# Patient Record
Sex: Male | Born: 1955 | Race: White | Hispanic: No | Smoking: Former smoker
Health system: Southern US, Community
[De-identification: ages and names within clinical notes are randomized; demographics above are authoritative.]

## PROBLEM LIST (undated history)

## (undated) DIAGNOSIS — N186 End stage renal disease: Secondary | ICD-10-CM

## (undated) DIAGNOSIS — K861 Other chronic pancreatitis: Secondary | ICD-10-CM

## (undated) DIAGNOSIS — F1910 Other psychoactive substance abuse, uncomplicated: Secondary | ICD-10-CM

## (undated) DIAGNOSIS — Z9289 Personal history of other medical treatment: Secondary | ICD-10-CM

## (undated) DIAGNOSIS — B182 Chronic viral hepatitis C: Secondary | ICD-10-CM

## (undated) DIAGNOSIS — J189 Pneumonia, unspecified organism: Secondary | ICD-10-CM

## (undated) DIAGNOSIS — F17200 Nicotine dependence, unspecified, uncomplicated: Secondary | ICD-10-CM

## (undated) DIAGNOSIS — I10 Essential (primary) hypertension: Secondary | ICD-10-CM

## (undated) DIAGNOSIS — I209 Angina pectoris, unspecified: Secondary | ICD-10-CM

## (undated) DIAGNOSIS — J449 Chronic obstructive pulmonary disease, unspecified: Secondary | ICD-10-CM

## (undated) DIAGNOSIS — Z992 Dependence on renal dialysis: Secondary | ICD-10-CM

## (undated) DIAGNOSIS — F1010 Alcohol abuse, uncomplicated: Secondary | ICD-10-CM

## (undated) DIAGNOSIS — B1920 Unspecified viral hepatitis C without hepatic coma: Secondary | ICD-10-CM

## (undated) DIAGNOSIS — K279 Peptic ulcer, site unspecified, unspecified as acute or chronic, without hemorrhage or perforation: Secondary | ICD-10-CM

## (undated) DIAGNOSIS — J45909 Unspecified asthma, uncomplicated: Secondary | ICD-10-CM

## (undated) DIAGNOSIS — J9690 Respiratory failure, unspecified, unspecified whether with hypoxia or hypercapnia: Secondary | ICD-10-CM

## (undated) DIAGNOSIS — R161 Splenomegaly, not elsewhere classified: Secondary | ICD-10-CM

## (undated) DIAGNOSIS — T300 Burn of unspecified body region, unspecified degree: Secondary | ICD-10-CM

## (undated) HISTORY — DX: Other psychoactive substance abuse, uncomplicated: F19.10

## (undated) HISTORY — PX: AV FISTULA PLACEMENT: SHX1204

## (undated) HISTORY — PX: ANKLE FRACTURE SURGERY: SHX122

## (undated) HISTORY — DX: Unspecified viral hepatitis C without hepatic coma: B19.20

## (undated) HISTORY — DX: Other chronic pancreatitis: K86.1

## (undated) HISTORY — DX: Dependence on renal dialysis: Z99.2

## (undated) HISTORY — DX: Alcohol abuse, uncomplicated: F10.10

## (undated) HISTORY — PX: ARM DEBRIDEMENT: SHX890

## (undated) HISTORY — DX: Essential (primary) hypertension: I10

## (undated) HISTORY — DX: Respiratory failure, unspecified, unspecified whether with hypoxia or hypercapnia: J96.90

## (undated) HISTORY — DX: Dependence on renal dialysis: N18.6

## (undated) HISTORY — DX: Chronic obstructive pulmonary disease, unspecified: J44.9

---

## 1993-08-05 HISTORY — PX: SKIN GRAFT: SHX250

## 2001-03-30 ENCOUNTER — Emergency Department (HOSPITAL_COMMUNITY): Admission: EM | Admit: 2001-03-30 | Discharge: 2001-03-30 | Payer: Self-pay | Admitting: Emergency Medicine

## 2002-05-24 ENCOUNTER — Emergency Department (HOSPITAL_COMMUNITY): Admission: EM | Admit: 2002-05-24 | Discharge: 2002-05-25 | Payer: Self-pay | Admitting: Emergency Medicine

## 2002-06-07 ENCOUNTER — Emergency Department (HOSPITAL_COMMUNITY): Admission: EM | Admit: 2002-06-07 | Discharge: 2002-06-07 | Payer: Self-pay | Admitting: Emergency Medicine

## 2002-06-16 ENCOUNTER — Emergency Department (HOSPITAL_COMMUNITY): Admission: EM | Admit: 2002-06-16 | Discharge: 2002-06-17 | Payer: Self-pay | Admitting: Emergency Medicine

## 2002-06-16 ENCOUNTER — Encounter: Payer: Self-pay | Admitting: Emergency Medicine

## 2002-06-22 ENCOUNTER — Encounter: Payer: Self-pay | Admitting: Psychiatry

## 2002-06-22 ENCOUNTER — Inpatient Hospital Stay (HOSPITAL_COMMUNITY): Admission: EM | Admit: 2002-06-22 | Discharge: 2002-06-28 | Payer: Self-pay | Admitting: Psychiatry

## 2002-06-22 ENCOUNTER — Encounter: Payer: Self-pay | Admitting: Emergency Medicine

## 2002-07-04 ENCOUNTER — Emergency Department (HOSPITAL_COMMUNITY): Admission: EM | Admit: 2002-07-04 | Discharge: 2002-07-04 | Payer: Self-pay | Admitting: Emergency Medicine

## 2002-07-05 ENCOUNTER — Encounter: Payer: Self-pay | Admitting: Emergency Medicine

## 2002-11-02 ENCOUNTER — Encounter: Payer: Self-pay | Admitting: Emergency Medicine

## 2002-11-02 ENCOUNTER — Emergency Department (HOSPITAL_COMMUNITY): Admission: EM | Admit: 2002-11-02 | Discharge: 2002-11-02 | Payer: Self-pay | Admitting: Emergency Medicine

## 2003-01-29 ENCOUNTER — Emergency Department (HOSPITAL_COMMUNITY): Admission: EM | Admit: 2003-01-29 | Discharge: 2003-01-29 | Payer: Self-pay | Admitting: Emergency Medicine

## 2003-01-29 ENCOUNTER — Encounter: Payer: Self-pay | Admitting: Emergency Medicine

## 2003-04-20 ENCOUNTER — Emergency Department (HOSPITAL_COMMUNITY): Admission: EM | Admit: 2003-04-20 | Discharge: 2003-04-20 | Payer: Self-pay | Admitting: Emergency Medicine

## 2003-04-20 ENCOUNTER — Encounter: Payer: Self-pay | Admitting: Emergency Medicine

## 2003-04-28 ENCOUNTER — Emergency Department (HOSPITAL_COMMUNITY): Admission: EM | Admit: 2003-04-28 | Discharge: 2003-04-28 | Payer: Self-pay | Admitting: Emergency Medicine

## 2003-04-28 ENCOUNTER — Encounter: Payer: Self-pay | Admitting: Emergency Medicine

## 2003-05-06 ENCOUNTER — Emergency Department (HOSPITAL_COMMUNITY): Admission: EM | Admit: 2003-05-06 | Discharge: 2003-05-07 | Payer: Self-pay | Admitting: Emergency Medicine

## 2003-05-07 ENCOUNTER — Encounter: Payer: Self-pay | Admitting: Emergency Medicine

## 2003-08-09 ENCOUNTER — Encounter: Payer: Self-pay | Admitting: Emergency Medicine

## 2003-08-10 ENCOUNTER — Inpatient Hospital Stay (HOSPITAL_COMMUNITY): Admission: EM | Admit: 2003-08-10 | Discharge: 2003-08-15 | Payer: Self-pay | Admitting: Psychiatry

## 2003-09-20 ENCOUNTER — Emergency Department (HOSPITAL_COMMUNITY): Admission: EM | Admit: 2003-09-20 | Discharge: 2003-09-21 | Payer: Self-pay | Admitting: Emergency Medicine

## 2003-10-06 ENCOUNTER — Emergency Department (HOSPITAL_COMMUNITY): Admission: EM | Admit: 2003-10-06 | Discharge: 2003-10-06 | Payer: Self-pay
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
## 2004-01-29 ENCOUNTER — Inpatient Hospital Stay (HOSPITAL_COMMUNITY): Admission: EM | Admit: 2004-01-29 | Discharge: 2004-02-02 | Payer: Self-pay | Admitting: Emergency Medicine

## 2004-02-21 ENCOUNTER — Inpatient Hospital Stay (HOSPITAL_COMMUNITY): Admission: EM | Admit: 2004-02-21 | Discharge: 2004-02-24 | Payer: Self-pay | Admitting: Emergency Medicine

## 2004-03-27 IMAGING — CR DG HAND COMPLETE 3+V*L*
5 series · 5 of 5 positions shown · non-contrast
Comparison: 07/05/02.

CLINICAL DATA: Medical clearance.  Pain.

[view not recorded (1 of 5)]
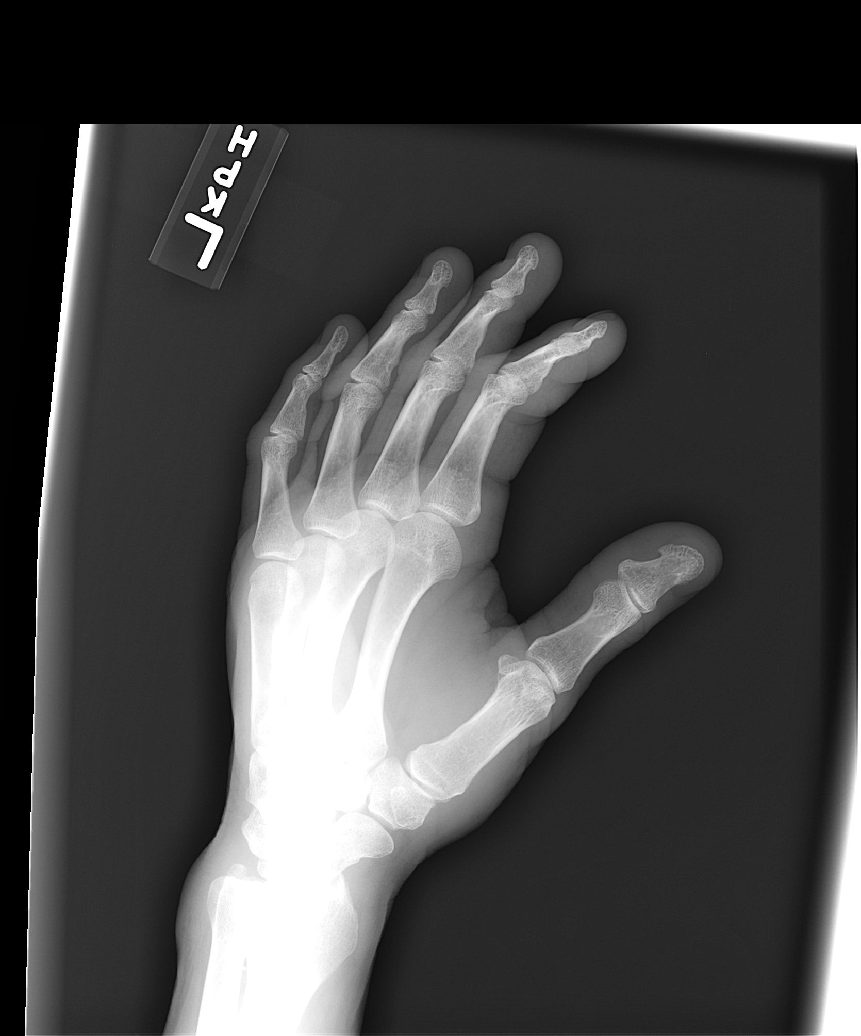

[view not recorded (2 of 5)]
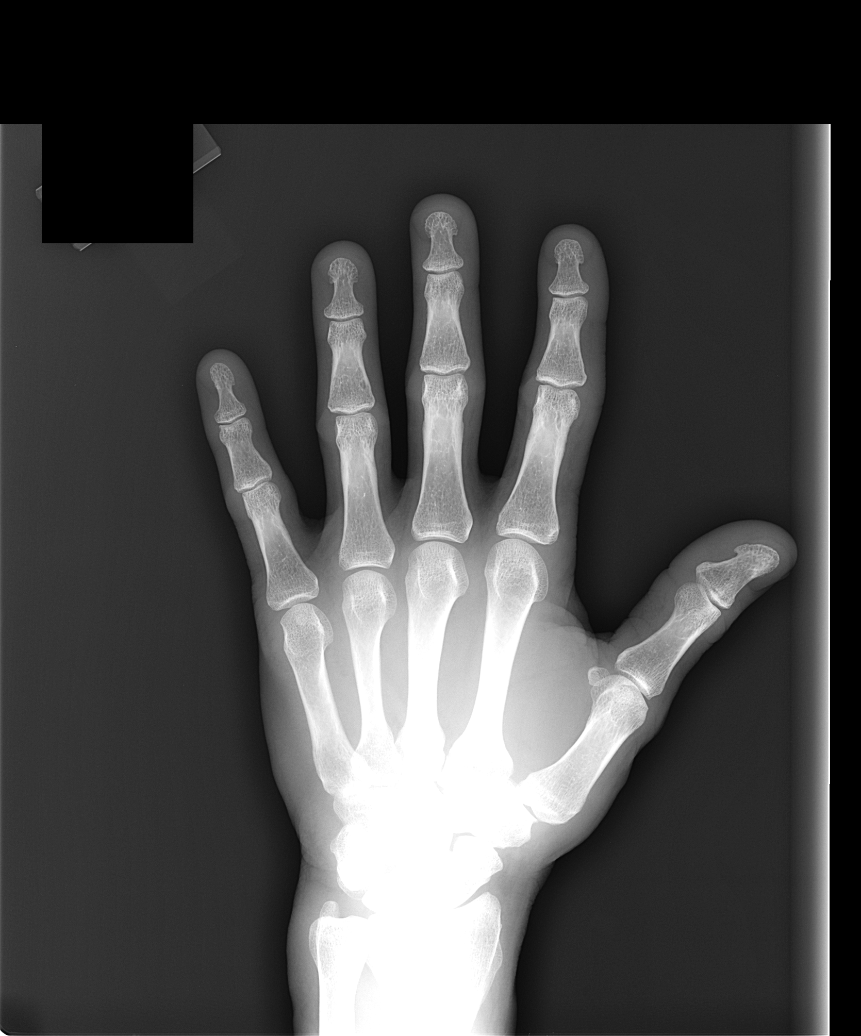

[view not recorded (3 of 5)]
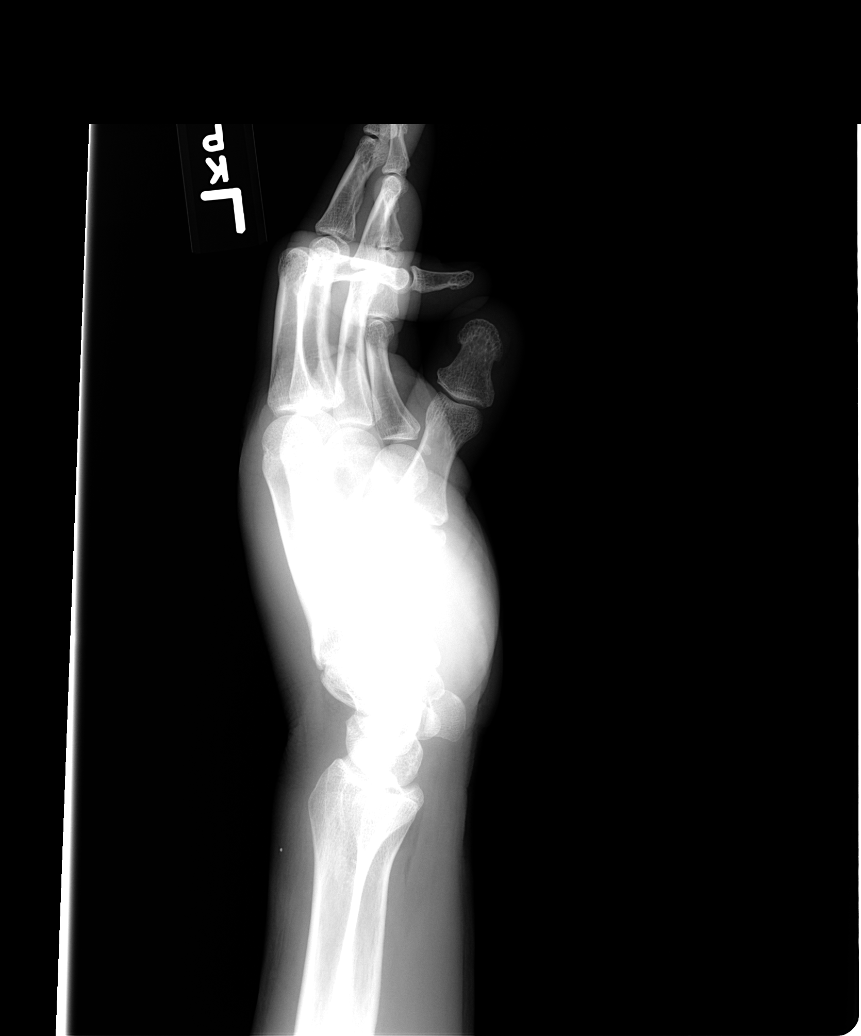

[view not recorded (4 of 5)]
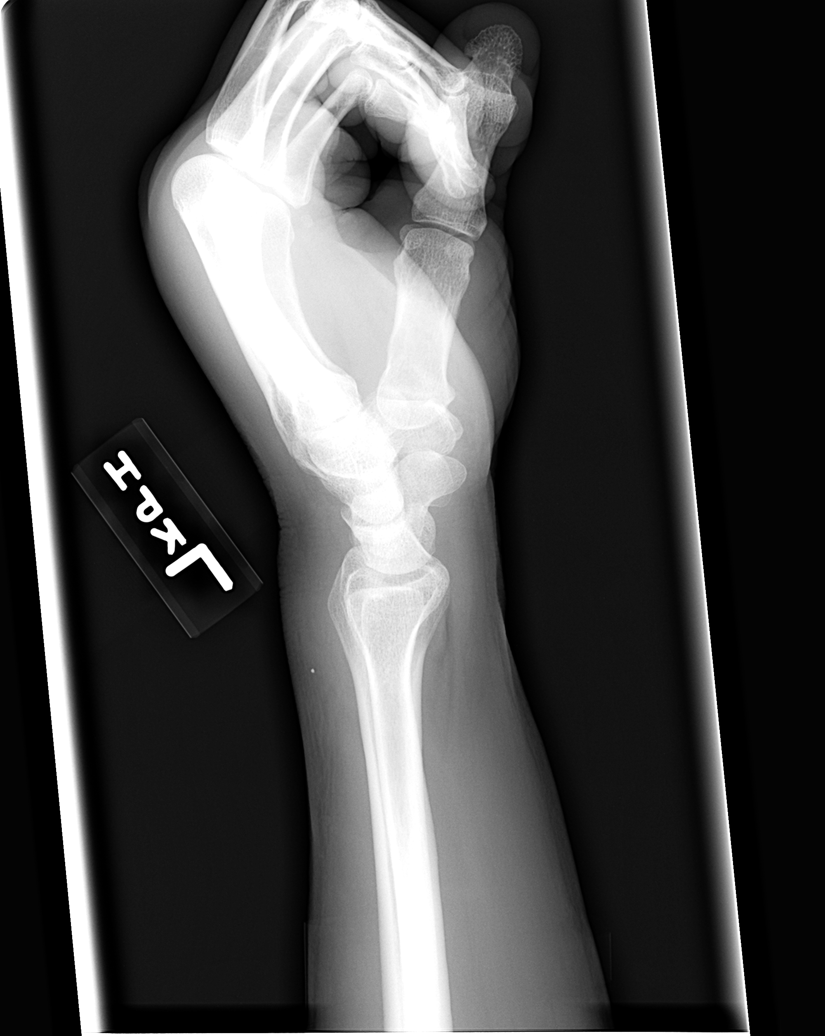

[view not recorded (5 of 5)]
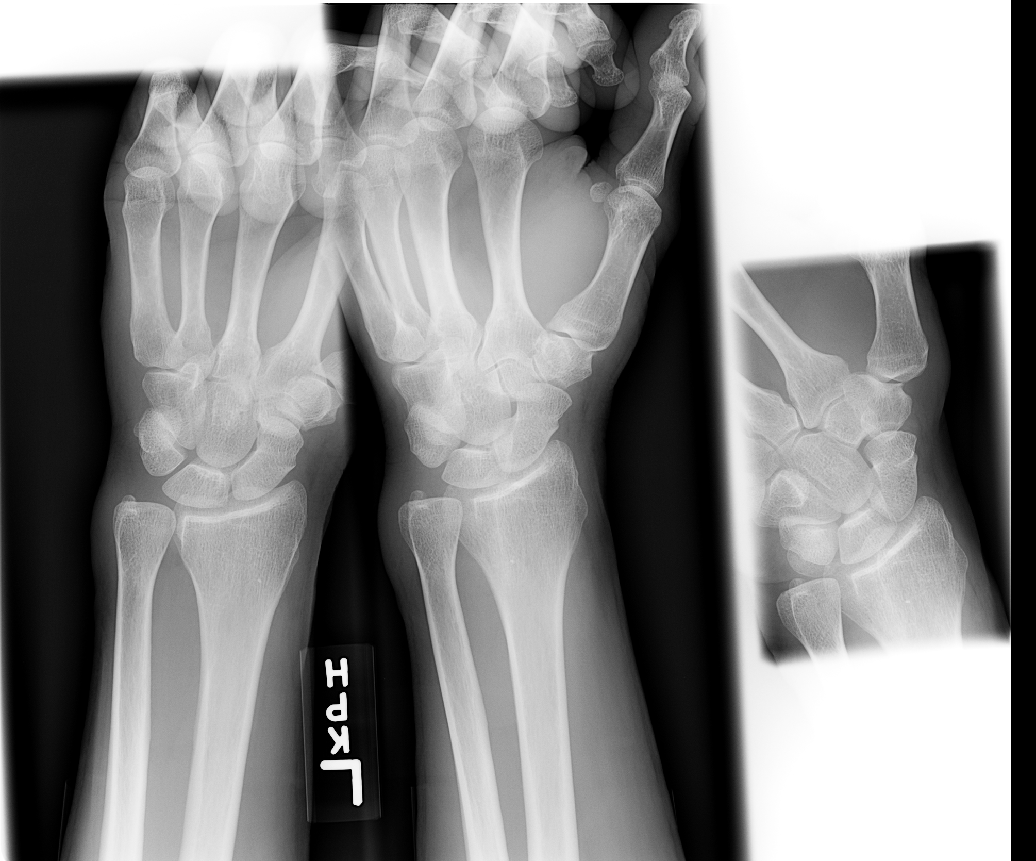

[5 of 5 positions shown; findings below may reference images not displayed]

LEFT HAND 
 There is no evidence of fracture or dislocation.  No other significant bone or soft tissue abnormalities are identified.  The joint spaces are within normal limits.

 IMPRESSION
 Normal Study.

 LEFT WRIST 
 There is no evidence of fracture or dislocation. No other significant bone or soft tissue abnormalities are identified. The joint spaces are within normal limits. 

 IMPRESSION
 Normal study.
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
## 2004-05-20 ENCOUNTER — Emergency Department (HOSPITAL_COMMUNITY): Admission: EM | Admit: 2004-05-20 | Discharge: 2004-05-20 | Payer: Self-pay | Admitting: Emergency Medicine

## 2004-08-09 ENCOUNTER — Emergency Department (HOSPITAL_COMMUNITY): Admission: EM | Admit: 2004-08-09 | Discharge: 2004-08-09 | Payer: Self-pay | Admitting: Emergency Medicine
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
## 2004-08-28 ENCOUNTER — Emergency Department (HOSPITAL_COMMUNITY): Admission: EM | Admit: 2004-08-28 | Discharge: 2004-08-28 | Payer: Self-pay | Admitting: *Deleted

## 2004-11-11 ENCOUNTER — Inpatient Hospital Stay (HOSPITAL_COMMUNITY): Admission: EM | Admit: 2004-11-11 | Discharge: 2004-11-17 | Payer: Self-pay | Admitting: Emergency Medicine

## 2004-11-25 IMAGING — CT CT HEAD W/O CM
1 series · 16 of 28 positions shown, 20 images · non-contrast
Comparison: none

CLINICAL DATA: Fall.  
 CT HEAD WITHOUT CONTRAST ([DATE] HOURS)

[Series 2: trauma head · axial · 0.47mm/px · z∈[+134,+263]mm · 16 of 28 slices shown, 20 images]
[im 2/28  brain]
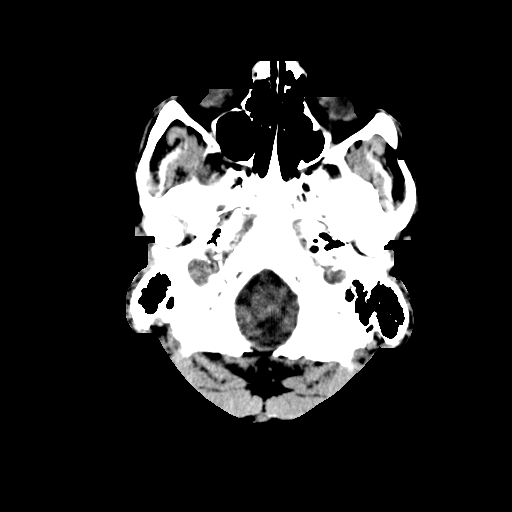
[im 2/28  bone]
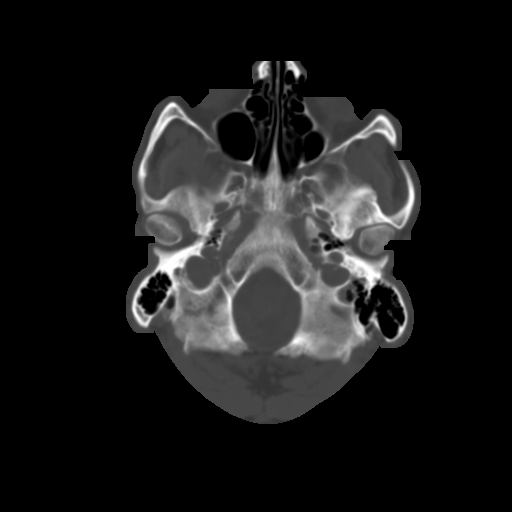
[im 4/28  brain]
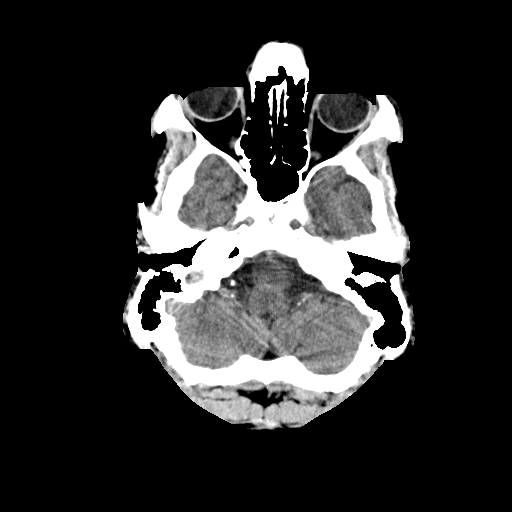
[im 6/28  brain]
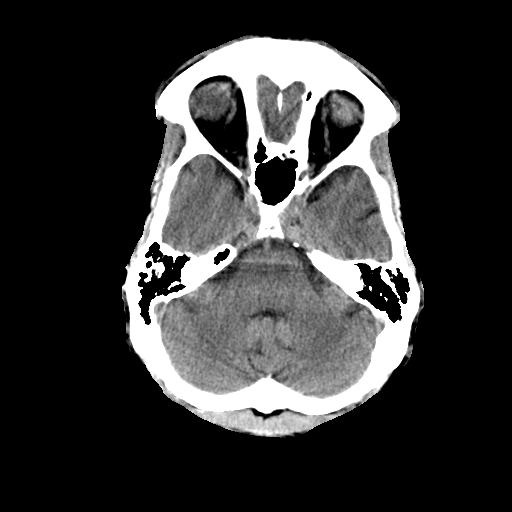
[im 7/28  brain]
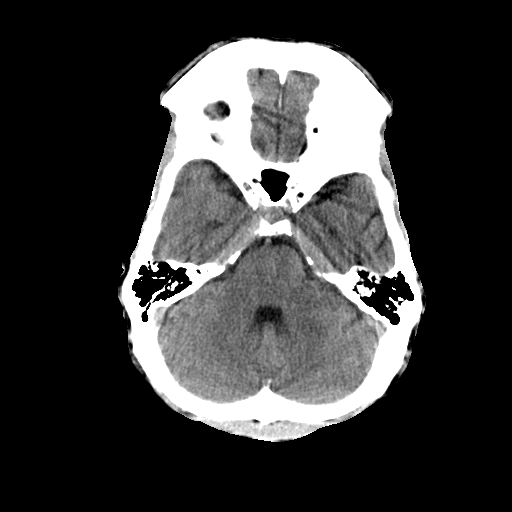
[im 9/28  brain]
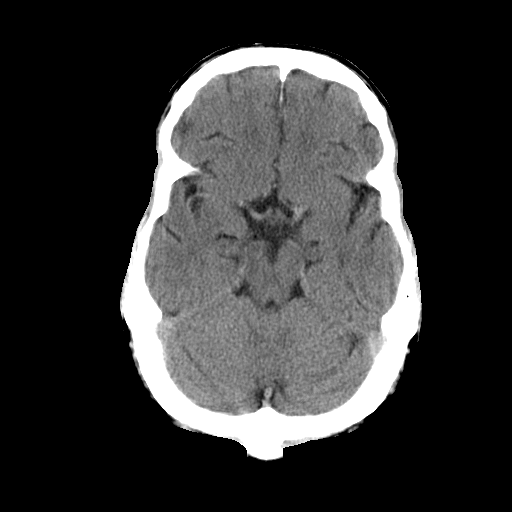
[im 9/28  bone]
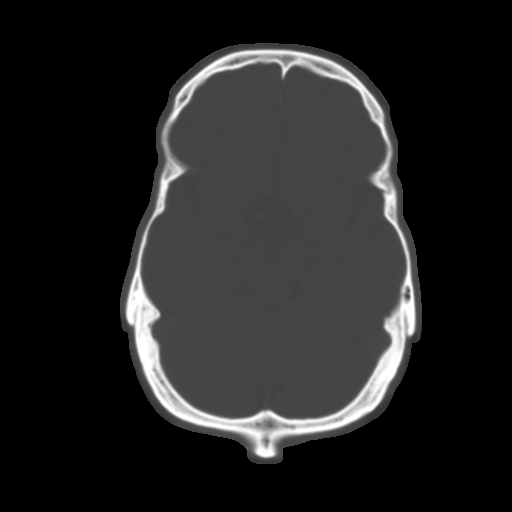
[im 10/28  brain]
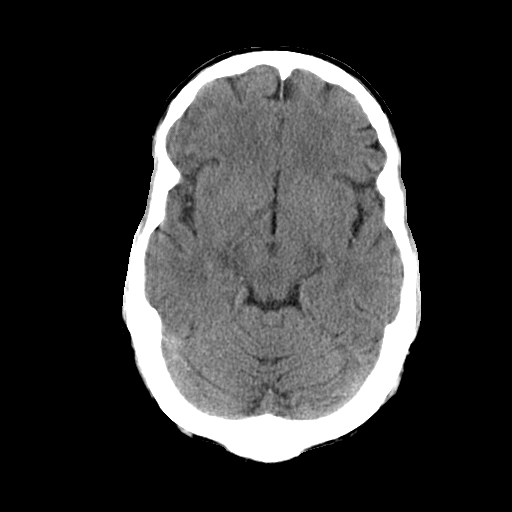
[im 12/28  brain]
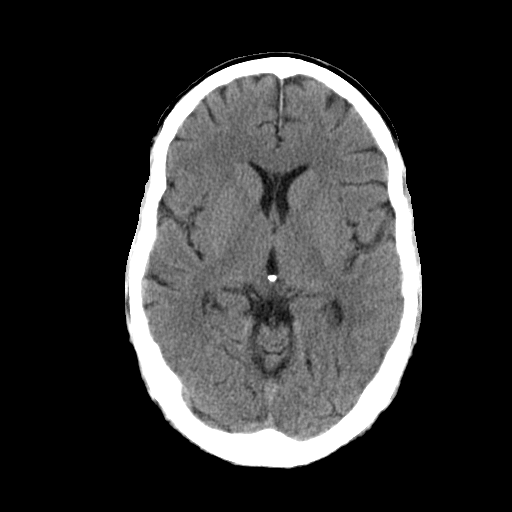
[im 14/28  brain]
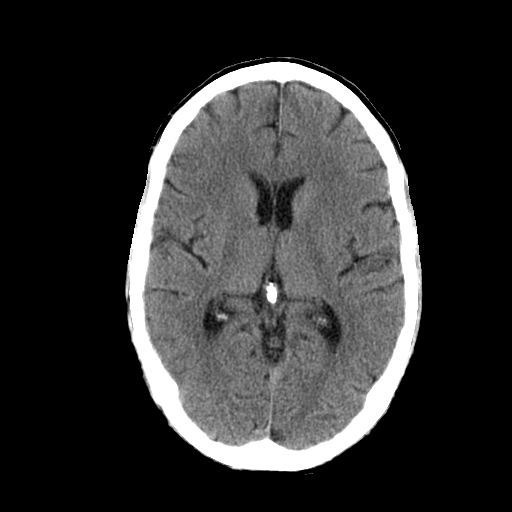
[im 15/28  brain]
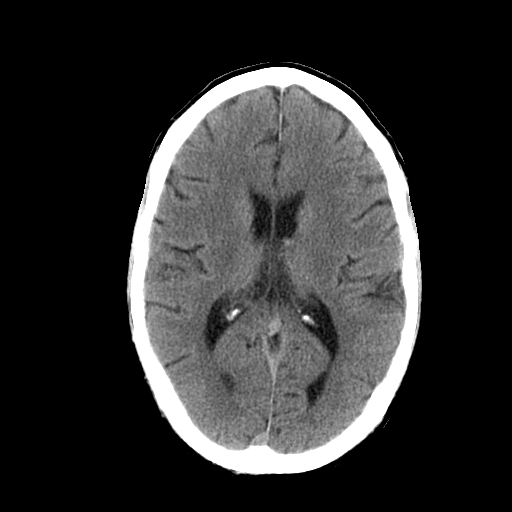
[im 15/28  bone]
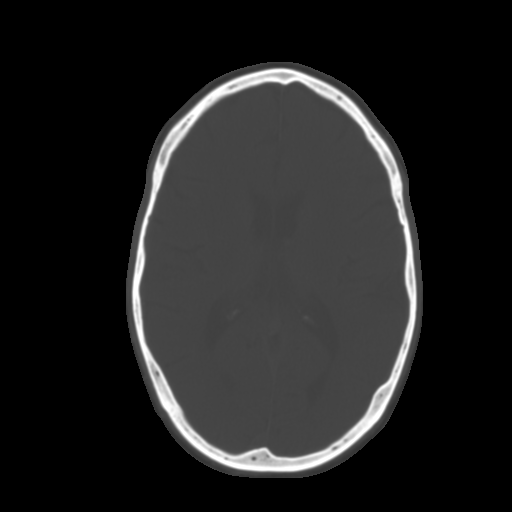
[im 17/28  brain]
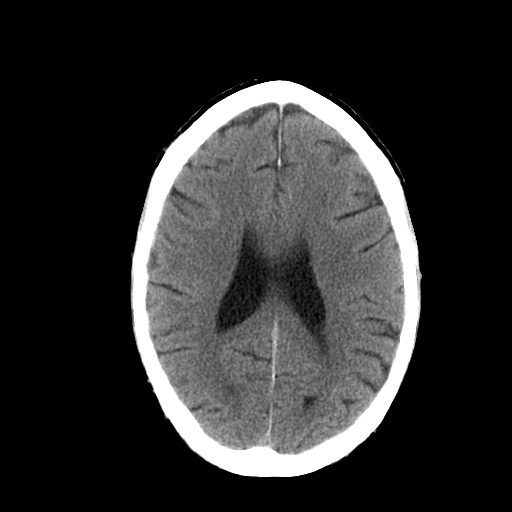
[im 19/28  brain]
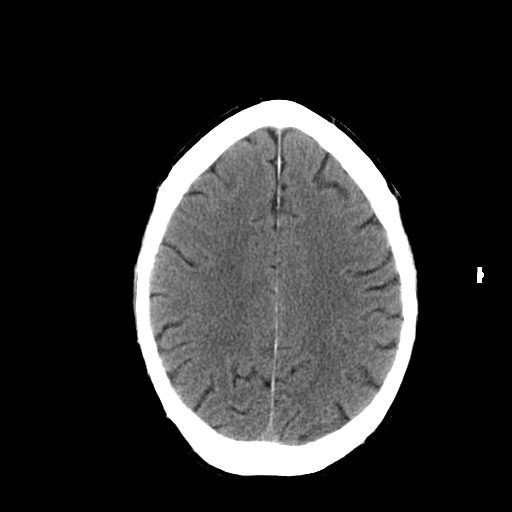
[im 20/28  brain]
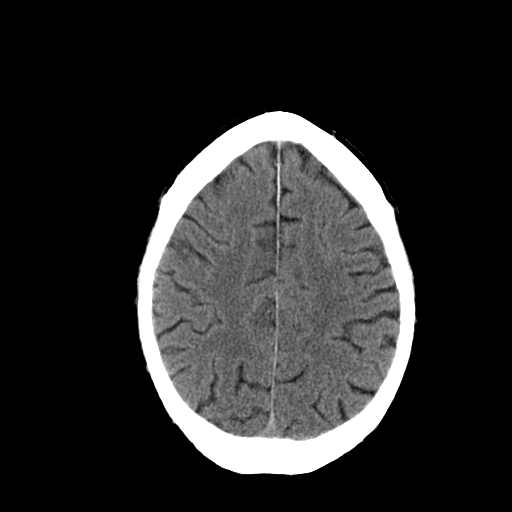
[im 22/28  brain]
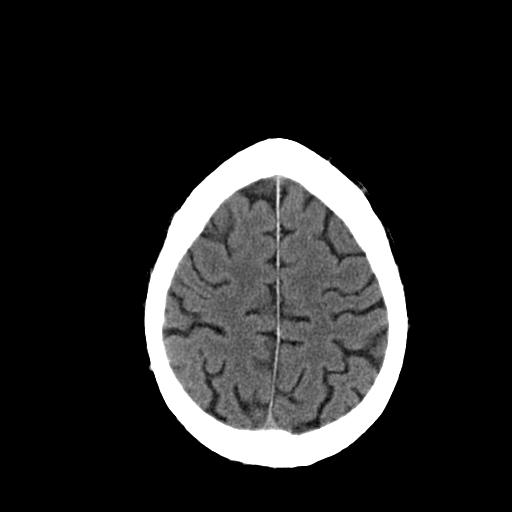
[im 22/28  bone]
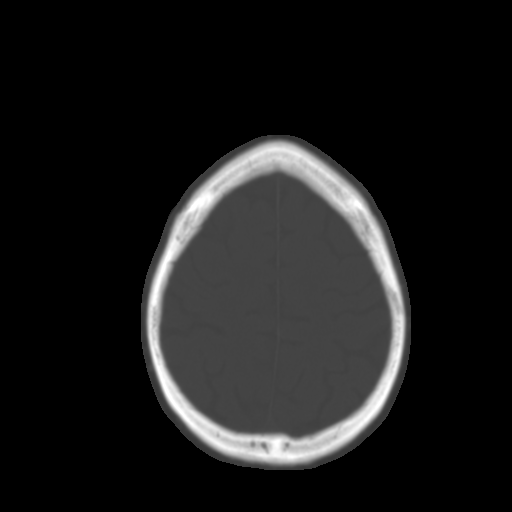
[im 23/28  brain]
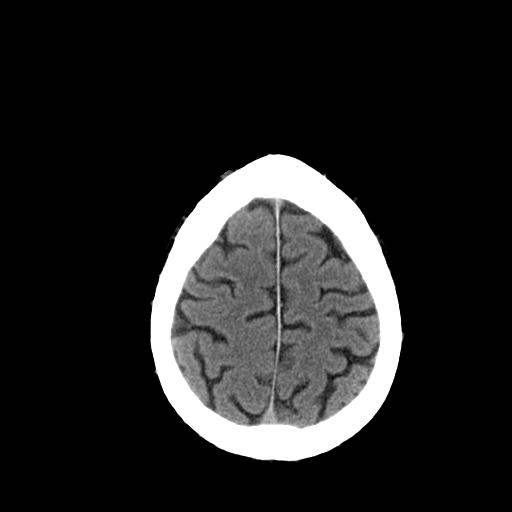
[im 25/28  brain]
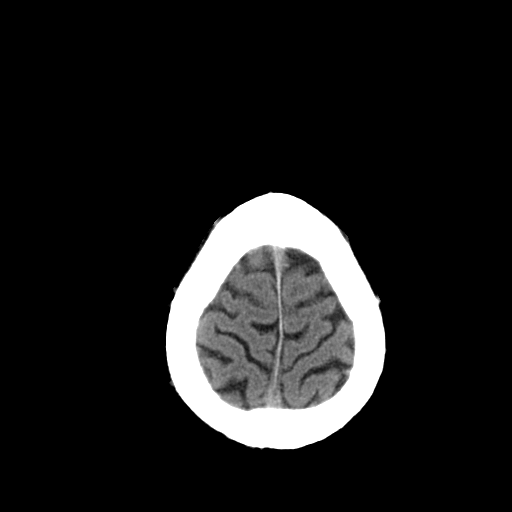
[im 27/28  brain]
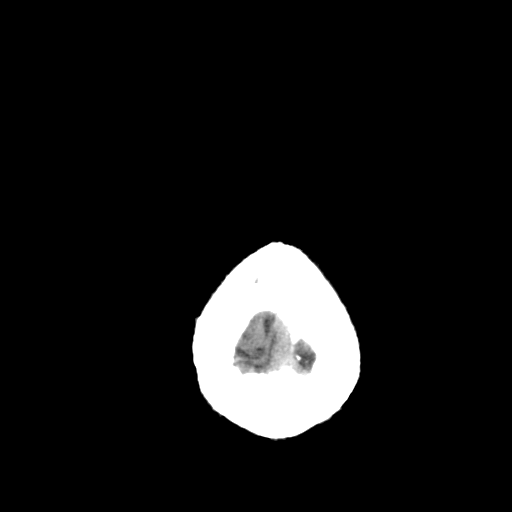

[16 of 28 positions shown; findings below may reference images not displayed]

FINDINGS: The brain parenchymal ventricular system, and extra-axial space are within normal limits.  There is no evidence of mass effect midline shift, or acute hemorrhage.  The mastoid air cells and visualized paranasal sinuses are clear.  The cranium is intact. 
 IMPRESSION
 No evidence of acute intracranial pathology.

## 2004-11-25 IMAGING — CR DG RIBS 2V*L*
3 series · 3 of 3 positions shown · non-contrast
Comparison: none

CLINICAL DATA: Fall.
 CHEST TWO VIEW, 05/20/04, 6967 HOURS
 Comparison 10/06/03.
 The heart is normal in size.  Linear atelectasis versus scar is present in the lingula.  Interstitial prominence is chronic.  No pneumothorax or effusions are seen.  There is no vertebral body height loss on the lateral view.
 IMPRESSION 
 No evidence for acute cardiopulmonary disease.
 LEFT RIB STUDY, THREE VIEWS, 05/20/04, 3888 HOURS

[view not recorded (1 of 3)]
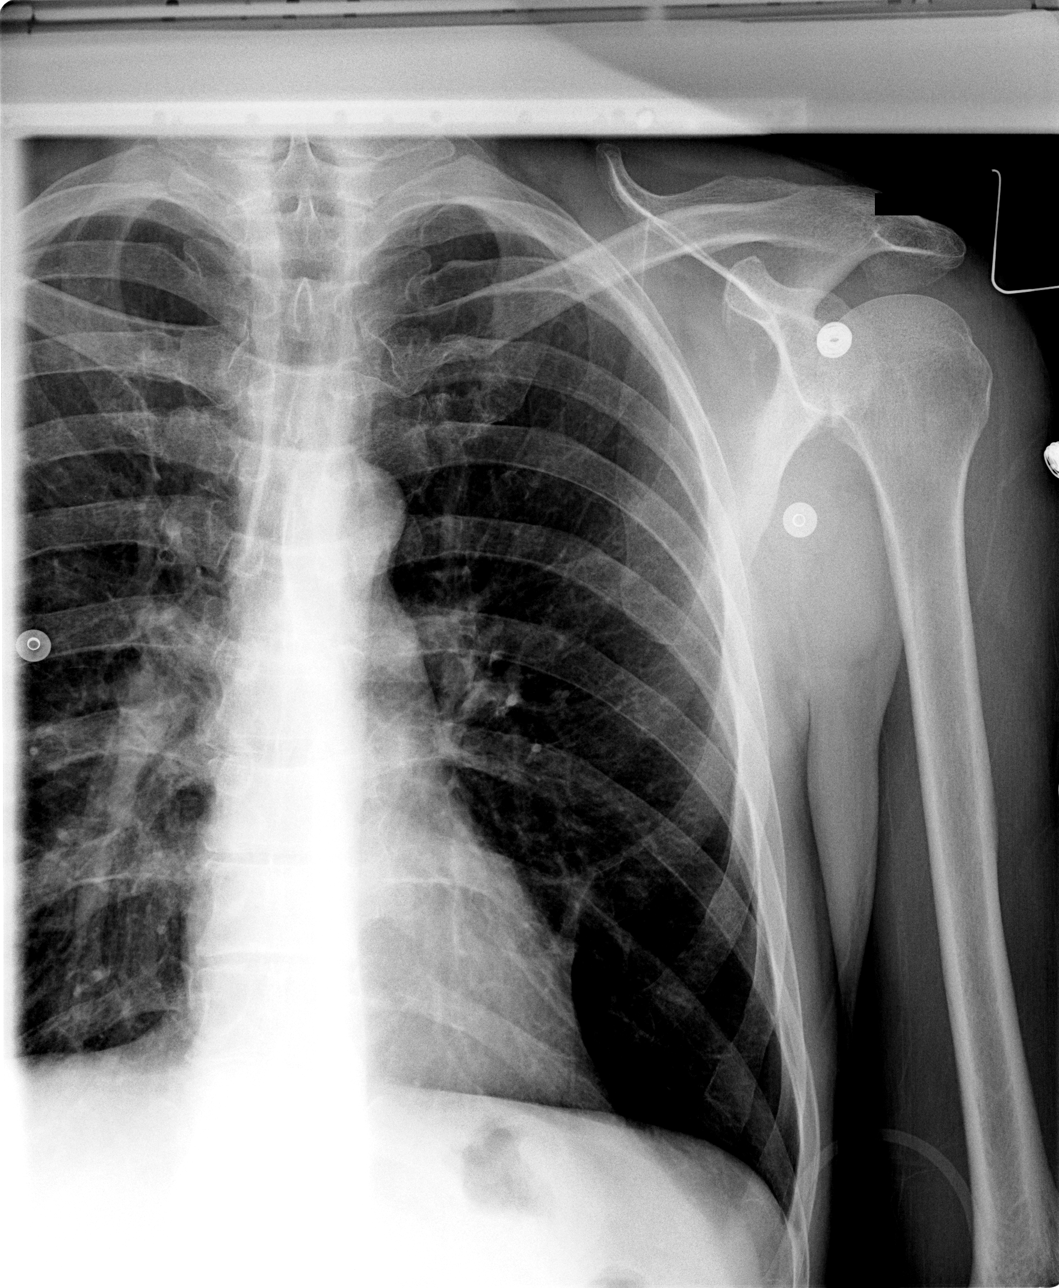

[view not recorded (2 of 3)]
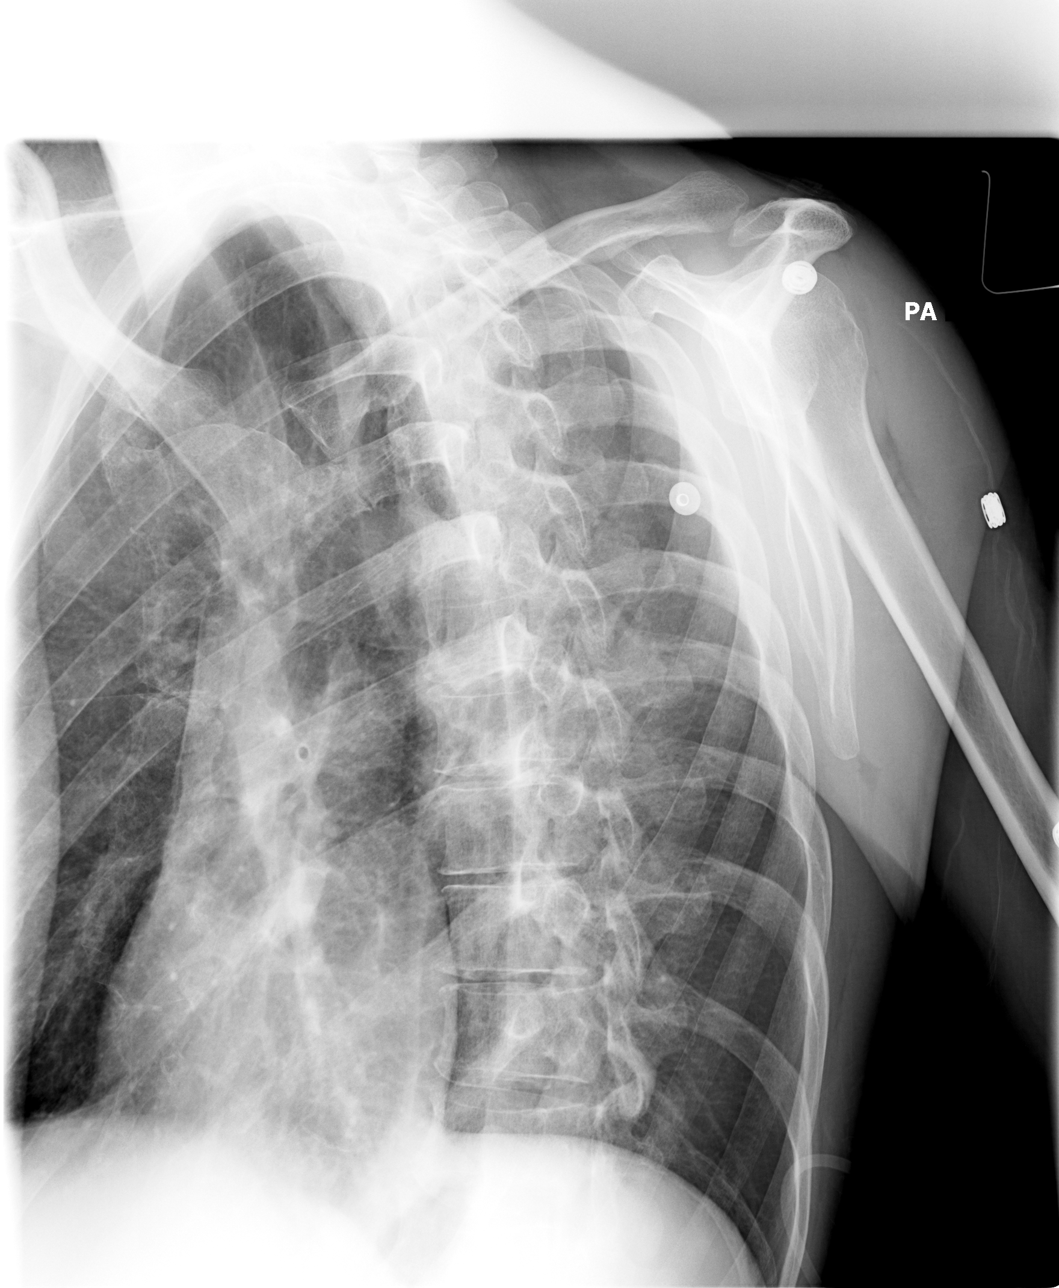

[view not recorded (3 of 3)]
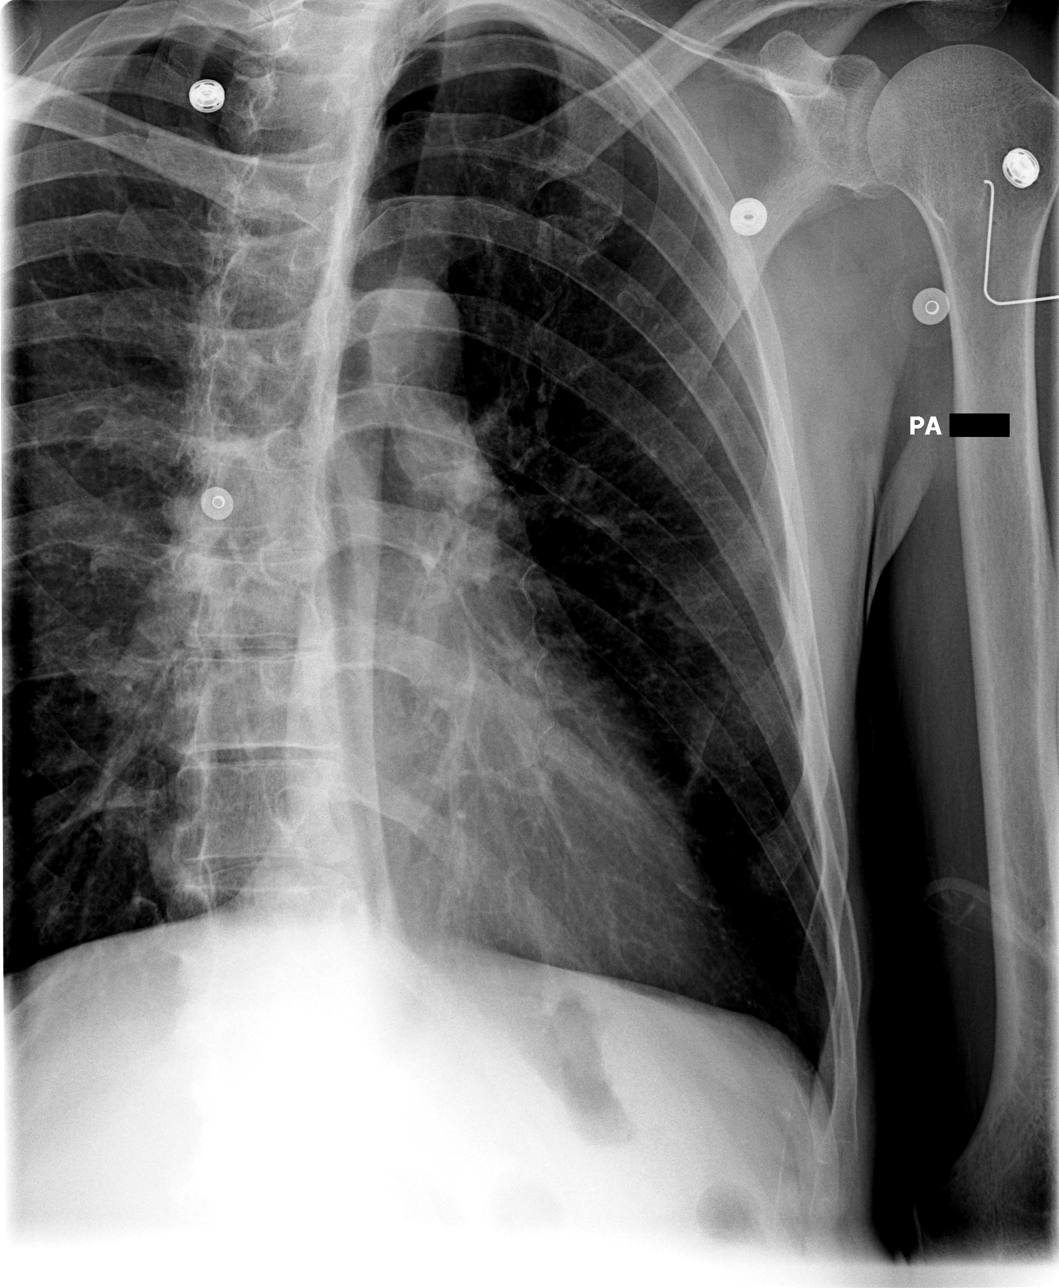

[3 of 3 positions shown; findings below may reference images not displayed]

FINDINGS: No rib fractures are seen.  The bones are mildly demineralized.
 IMPRESSION 
 No evidence of rib fracture.
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
## 2004-12-02 ENCOUNTER — Emergency Department: Payer: Self-pay | Admitting: Emergency Medicine

## 2004-12-07 ENCOUNTER — Emergency Department (HOSPITAL_COMMUNITY): Admission: EM | Admit: 2004-12-07 | Discharge: 2004-12-07 | Payer: Self-pay | Admitting: *Deleted

## 2004-12-15 ENCOUNTER — Emergency Department (HOSPITAL_COMMUNITY): Admission: EM | Admit: 2004-12-15 | Discharge: 2004-12-15 | Payer: Self-pay | Admitting: Emergency Medicine

## 2004-12-19 ENCOUNTER — Ambulatory Visit: Payer: Self-pay | Admitting: Psychiatry

## 2004-12-19 ENCOUNTER — Inpatient Hospital Stay (HOSPITAL_COMMUNITY): Admission: EM | Admit: 2004-12-19 | Discharge: 2004-12-26 | Payer: Self-pay | Admitting: Psychiatry
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
## 2005-03-05 IMAGING — CR DG KNEE 1-2V*R*
2 series · 2 of 2 positions shown · non-contrast
Comparison: 02/20/04.

CLINICAL DATA: Right knee pain and swelling. 
 RIGHT KNEE - 2 VIEW, 08/28/04:

[view not recorded (1 of 2)]
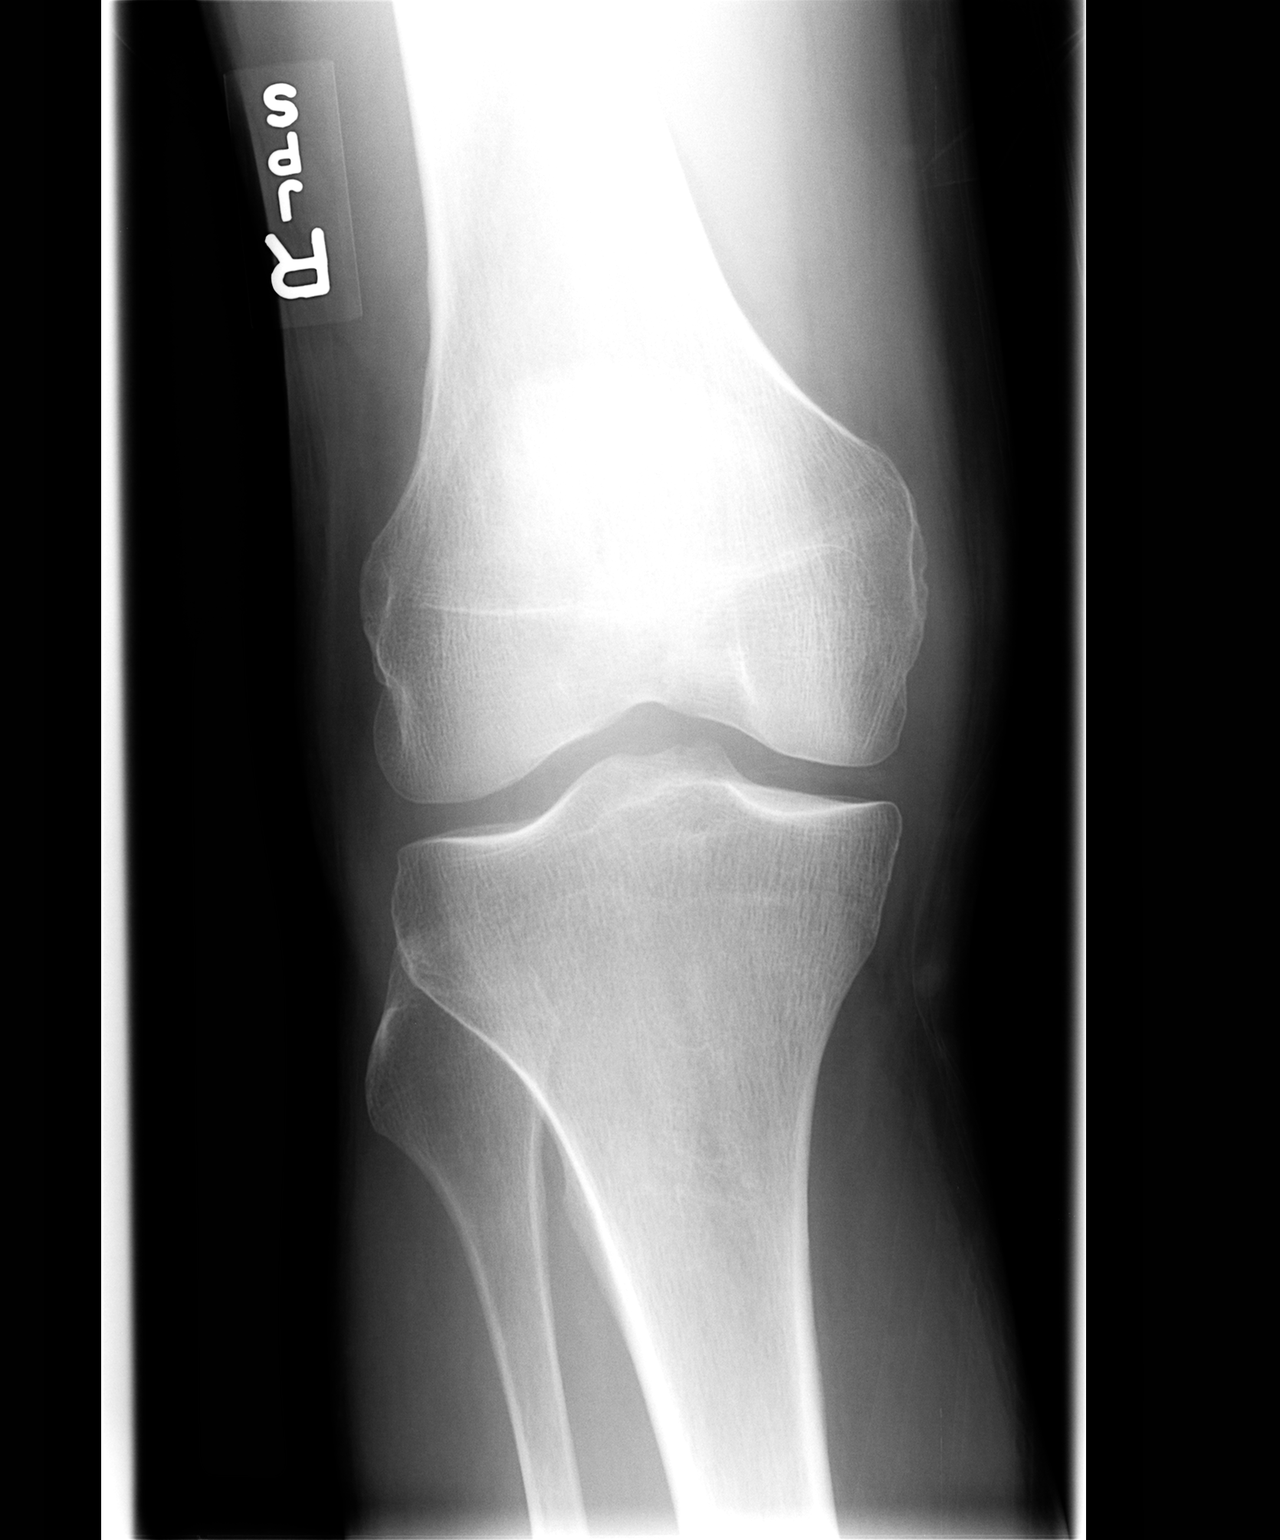

[view not recorded (2 of 2)]
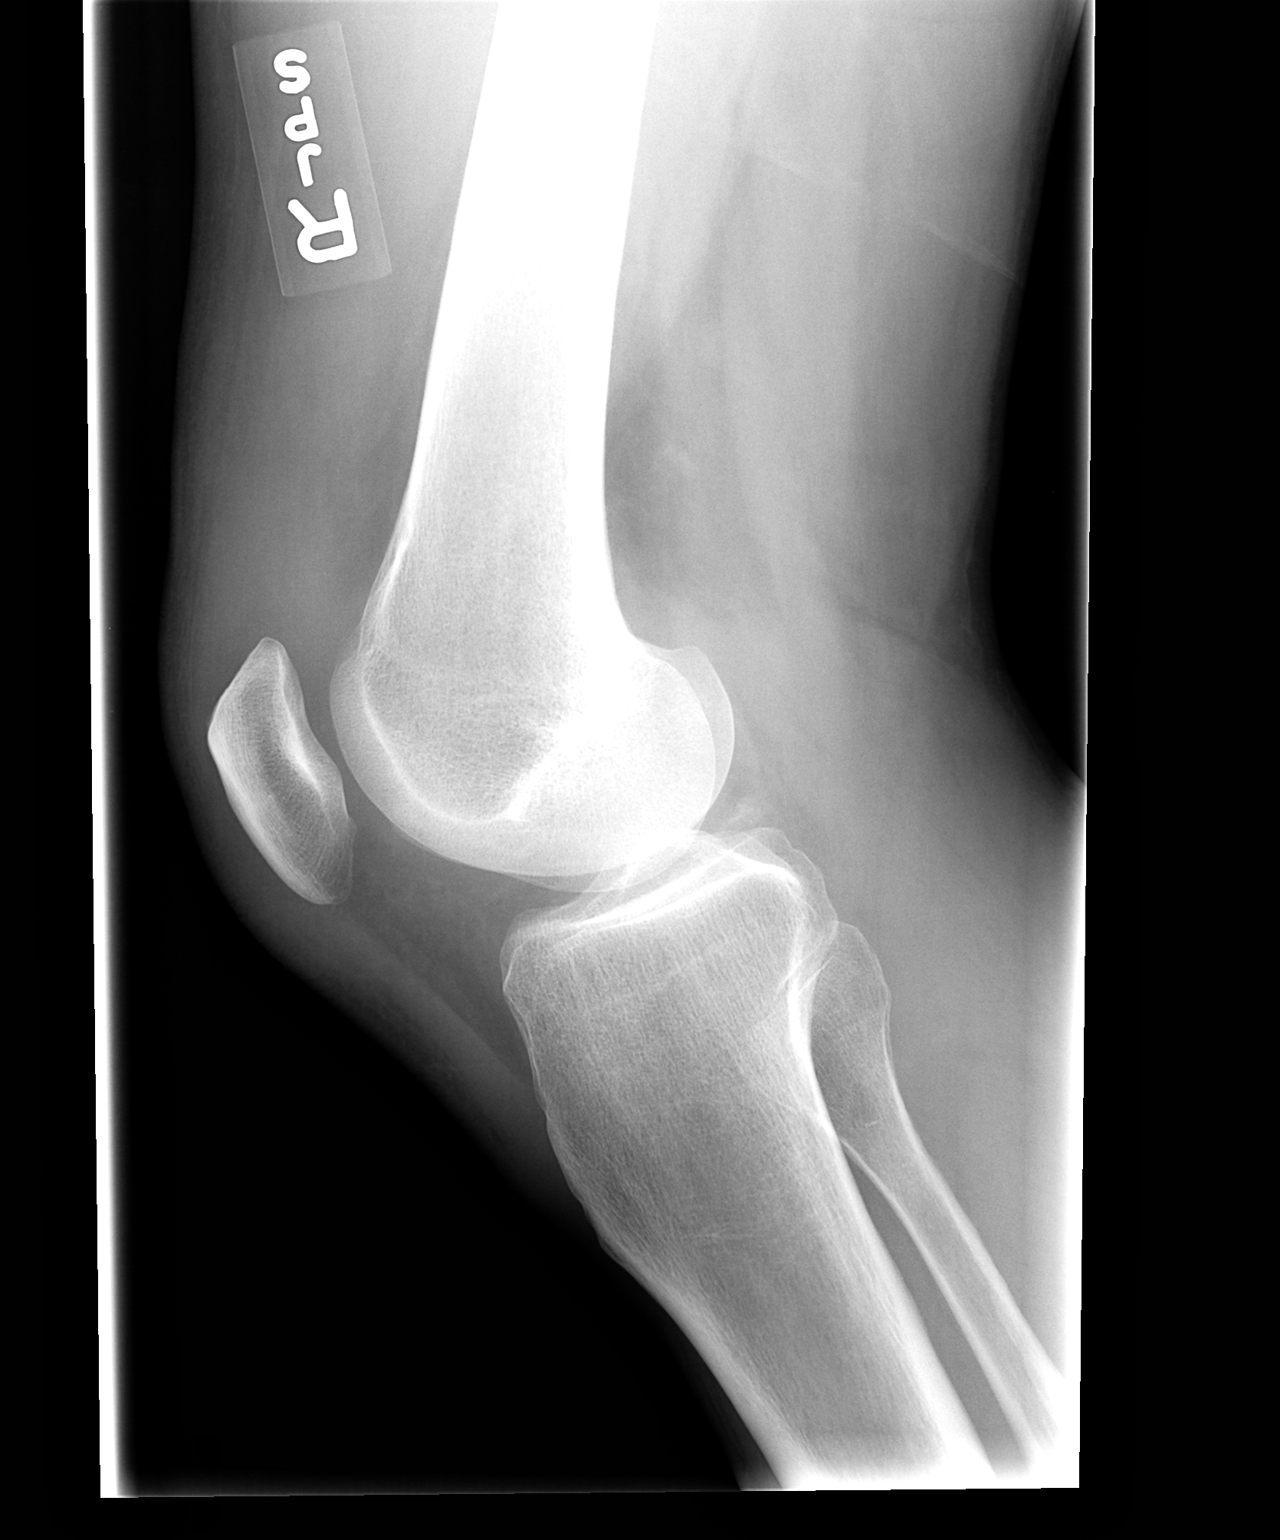

[2 of 2 positions shown; findings below may reference images not displayed]

There is evidence of a suprapatellar joint effusion.  No bony fracture or dislocation.  No significant joint space abnormality.
IMPRESSION: Joint effusion without visible bony abnormality.

## 2005-04-10 ENCOUNTER — Emergency Department (HOSPITAL_COMMUNITY): Admission: EM | Admit: 2005-04-10 | Discharge: 2005-04-11 | Payer: Self-pay | Admitting: Emergency Medicine

## 2005-05-06 ENCOUNTER — Emergency Department (HOSPITAL_COMMUNITY): Admission: EM | Admit: 2005-05-06 | Discharge: 2005-05-06 | Payer: Self-pay | Admitting: Emergency Medicine

## 2005-05-09 ENCOUNTER — Emergency Department (HOSPITAL_COMMUNITY): Admission: EM | Admit: 2005-05-09 | Discharge: 2005-05-09 | Payer: Self-pay | Admitting: *Deleted

## 2005-05-10 ENCOUNTER — Emergency Department (HOSPITAL_COMMUNITY): Admission: EM | Admit: 2005-05-10 | Discharge: 2005-05-10 | Payer: Self-pay | Admitting: Emergency Medicine

## 2005-05-12 ENCOUNTER — Emergency Department (HOSPITAL_COMMUNITY): Admission: EM | Admit: 2005-05-12 | Discharge: 2005-05-12 | Payer: Self-pay | Admitting: Emergency Medicine

## 2005-05-20 IMAGING — CR DG RIBS W/ CHEST 3+V*R*
1 series · 1 of 1 positions shown · non-contrast
Comparison: none

CLINICAL DATA: Benzodiazepine withdrawal.  Having right lower anterior rib pain. 
RIGHT RIB DETAIL ? 1 VIEW:
Remote fracture of the right lower posterolateral ribs, probably 8th, 9th, and 10th ribs.  Negative for acute bony abnormality.

[w ribs oblique right]
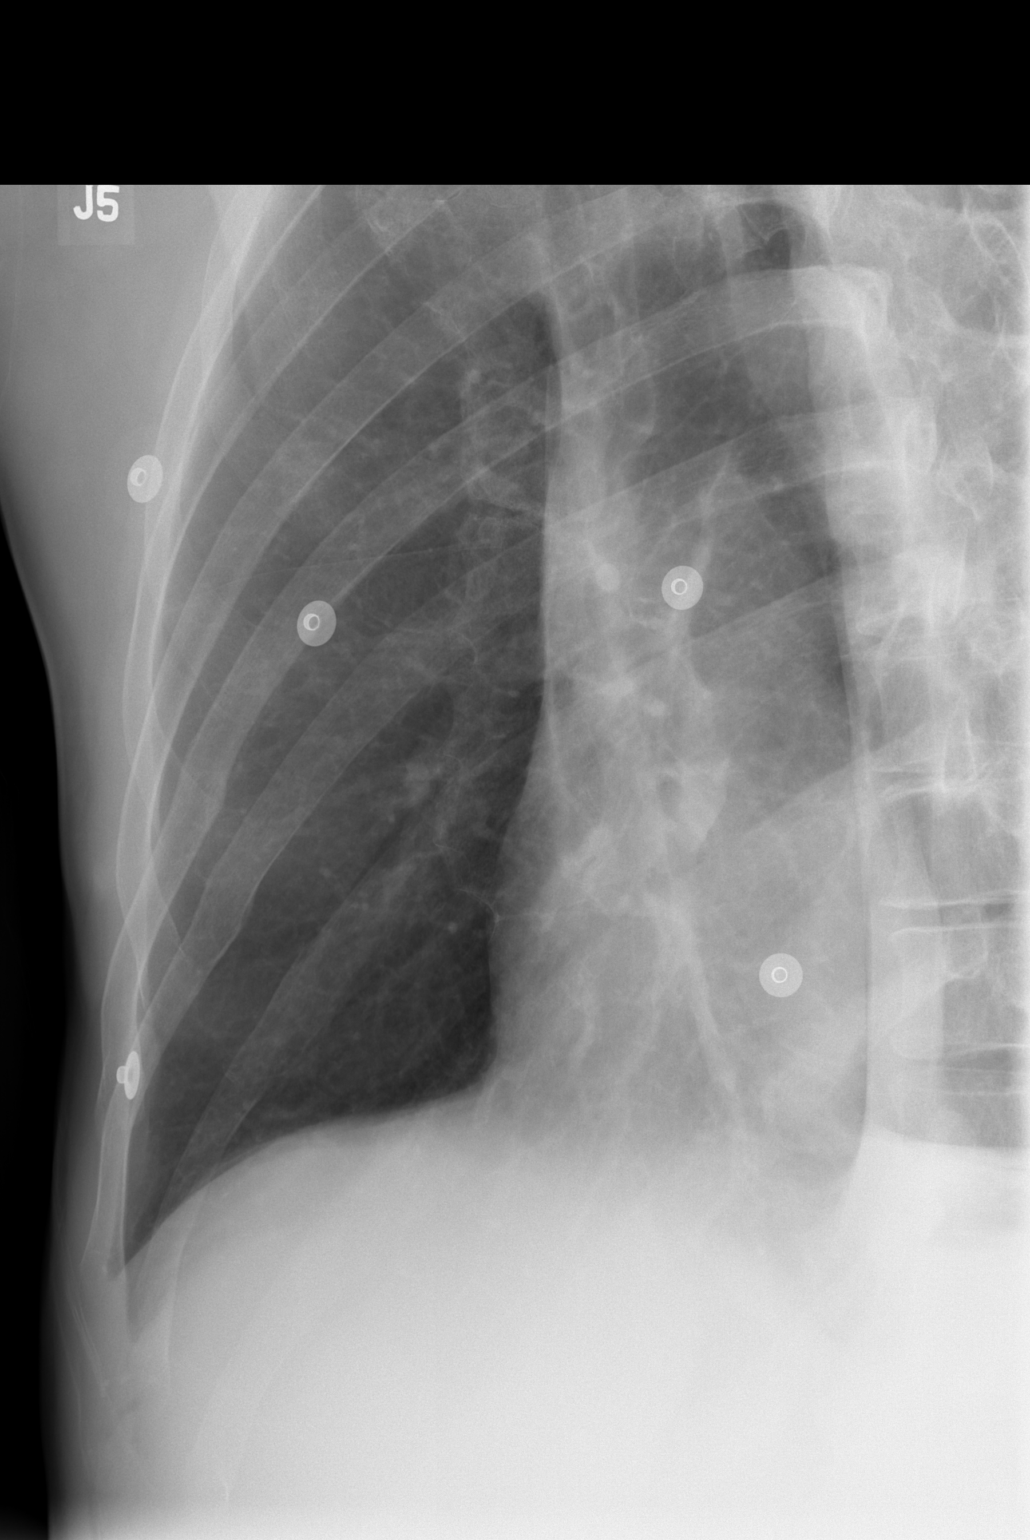

[1 of 1 positions shown; findings below may reference images not displayed]

IMPRESSION: No acute right rib fracture.

## 2005-05-21 IMAGING — CR DG CHEST 2V
2 series · 2 of 2 positions shown · non-contrast
Comparison: 08/09/04.

CLINICAL DATA: Drug and alcohol withdrawal/right chest pain.
 AMLRA-X VIEW:

[view not recorded (1 of 2)]
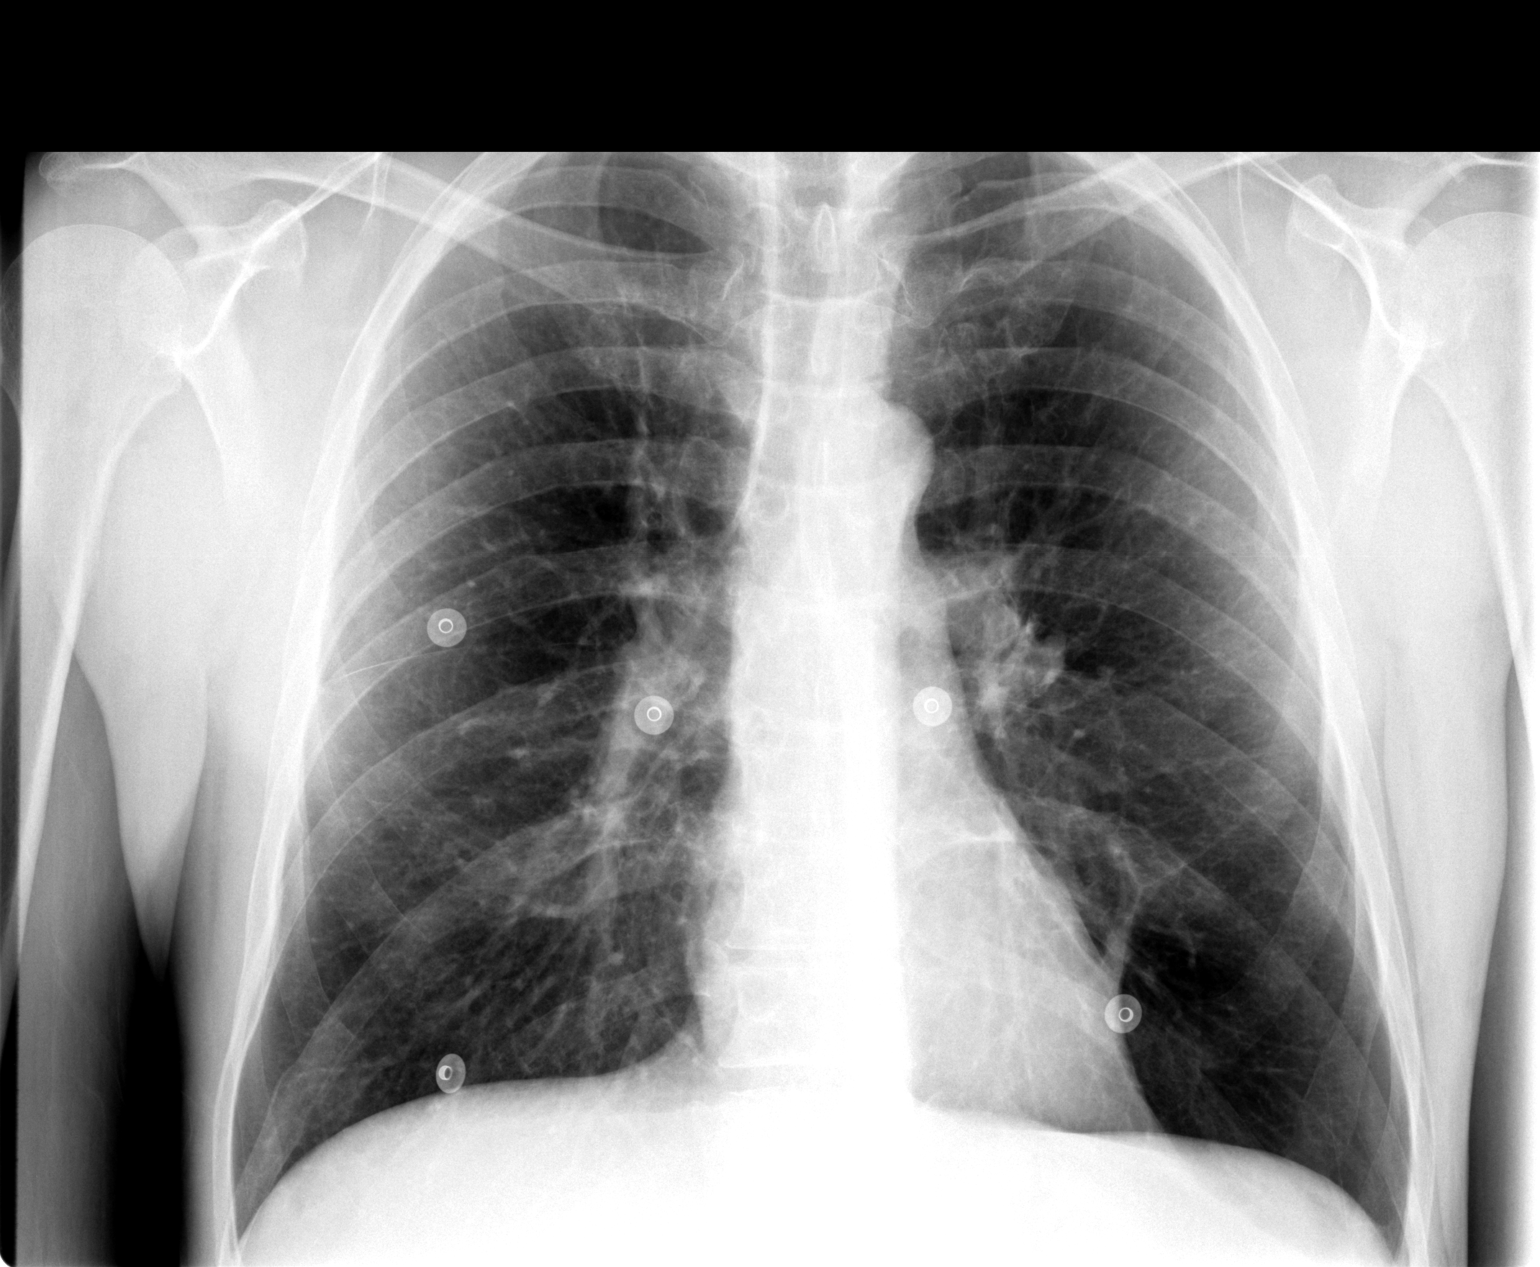

[view not recorded (2 of 2)]
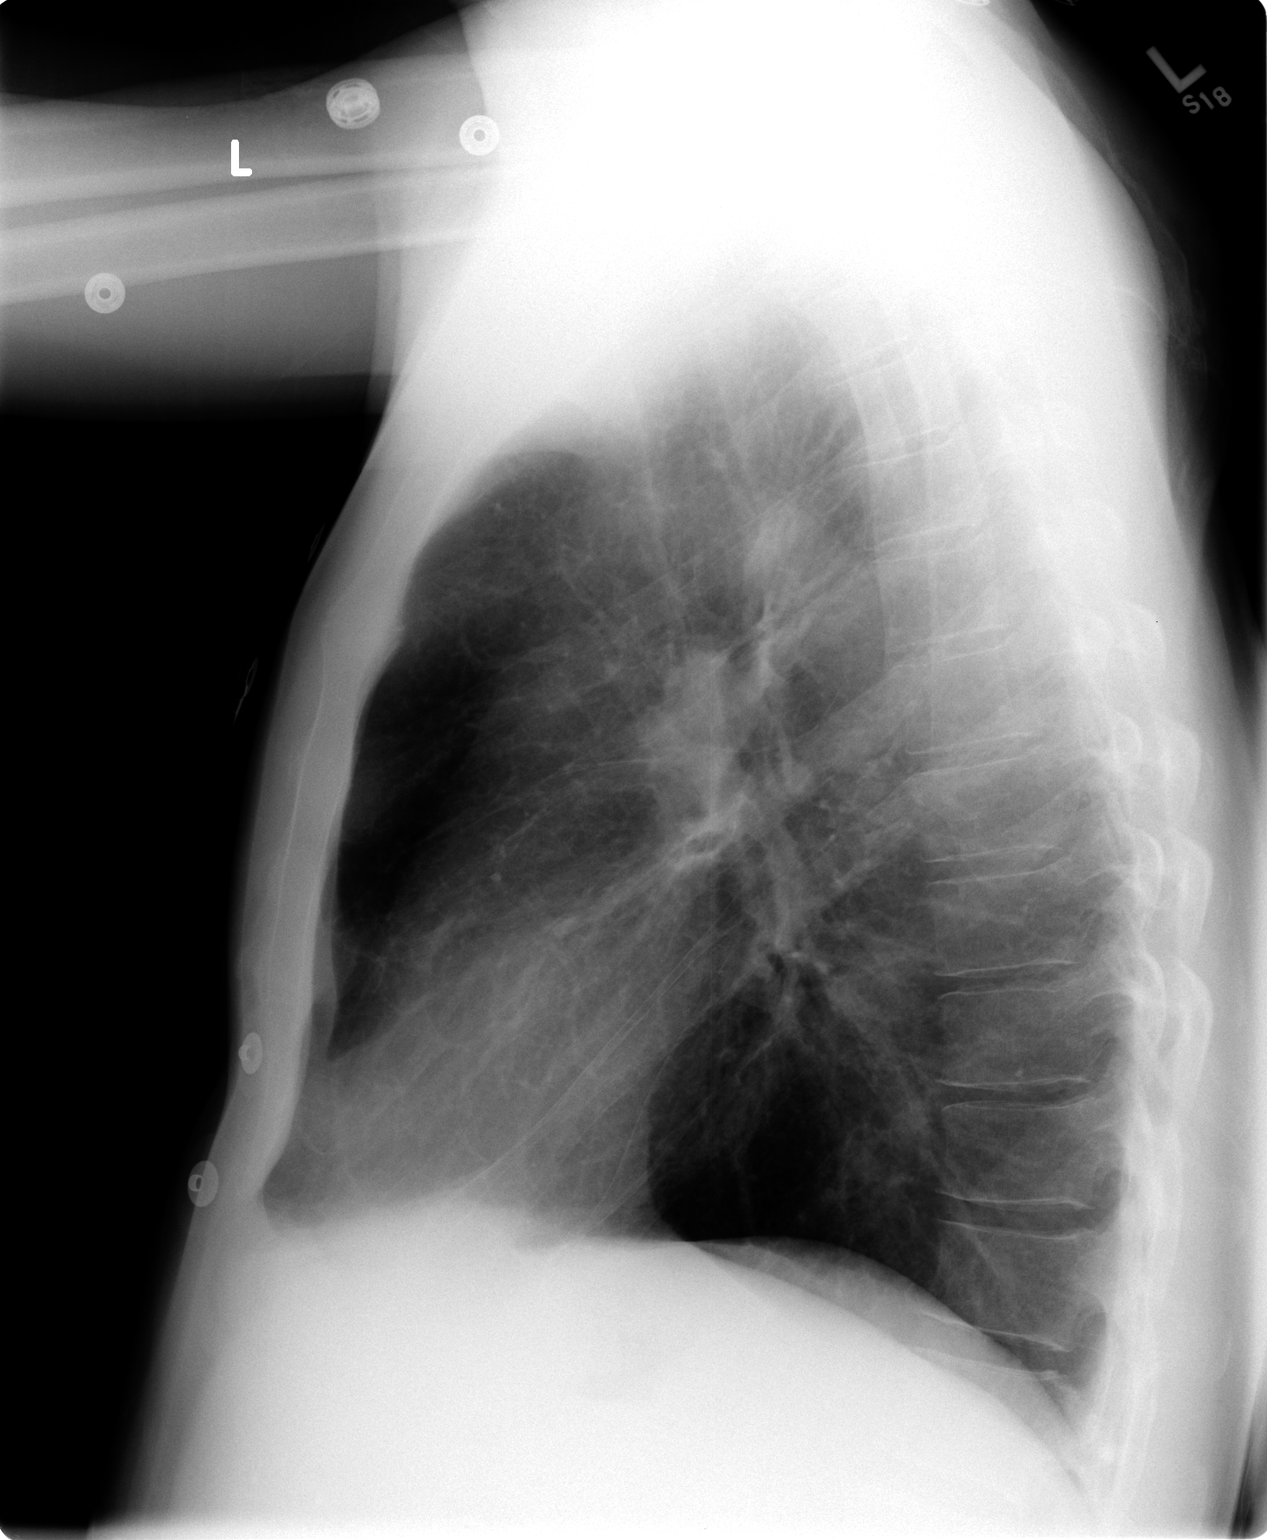

[2 of 2 positions shown; findings below may reference images not displayed]

Heart size and contour are normal. Lungs moderately hyperaerated with peribronchial thickening and some scarring at the left base.  No acute process.  No pneumothorax or fractures.
IMPRESSION: Chronic lung changes ? no acute findings.

## 2005-05-24 IMAGING — CT CT PELVIS W/ CM
1 of 4 series · 14 of 32 positions shown, 19 images · IV contrast (omnipaque)
Comparison: CTs of the abdomen and pelvis done 09/20/2003.

CLINICAL DATA: Benzodiazepine withdrawal with abdominal pain.
 CT OF THE ABDOMEN WITH CONTRAST:
TECHNIQUE: Multidetector helical CT imaging of the abdomen and pelvis was performed during the IV bolus injection of 415cc Omnipaque 300.  Oral contrast was given.

[Series 2: abd_pel 5.0 b40f st · axial · 0.68mm/px · z∈[-486,-81]mm · 14 of 93 slices shown, 19 images]
[im 6/93  soft-tissue]
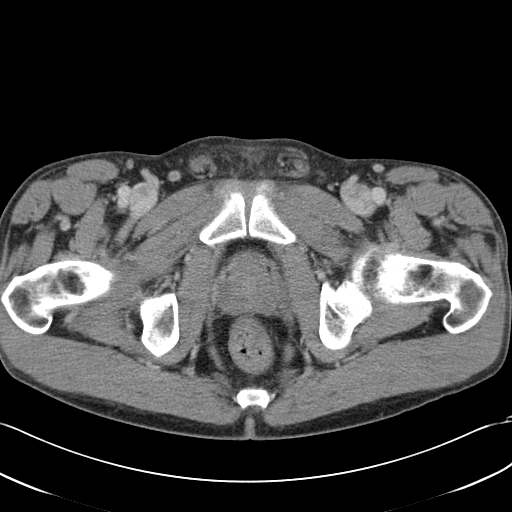
[im 6/93  bone]
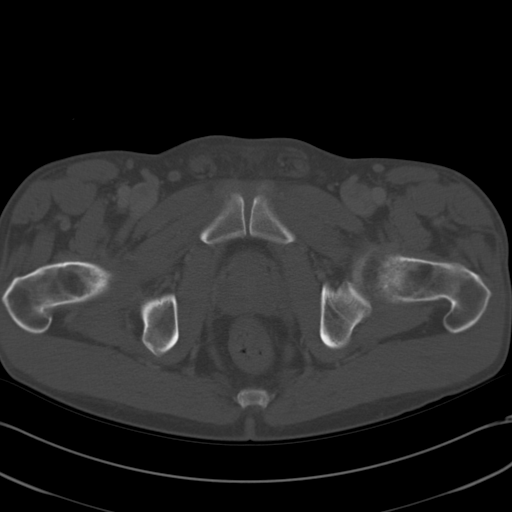
[im 11/93  soft-tissue]
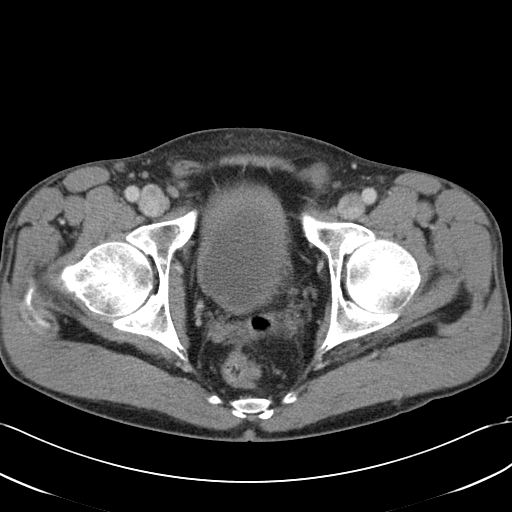
[im 21/93  soft-tissue]
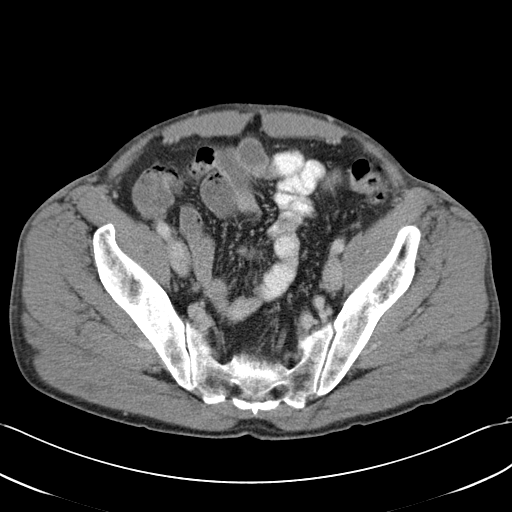
[im 26/93  soft-tissue]
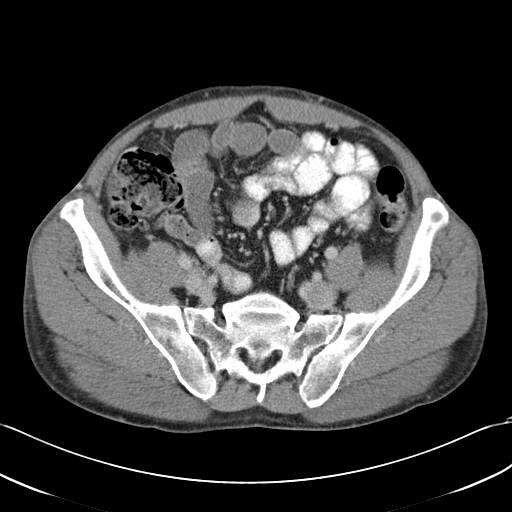
[im 31/93  soft-tissue]
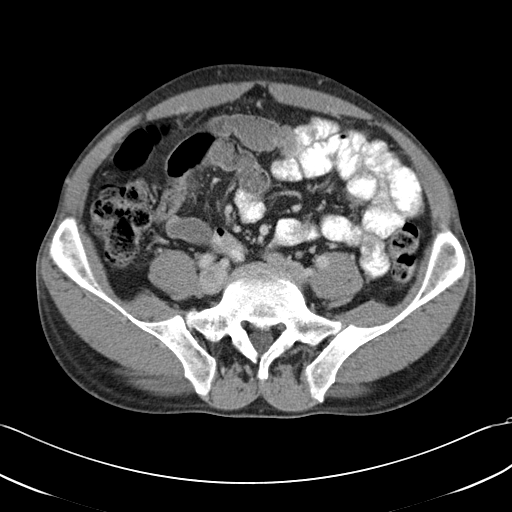
[im 41/93  soft-tissue]
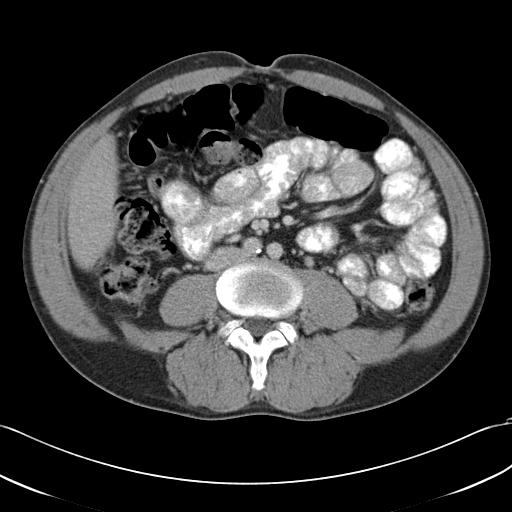
[im 47/93  soft-tissue]
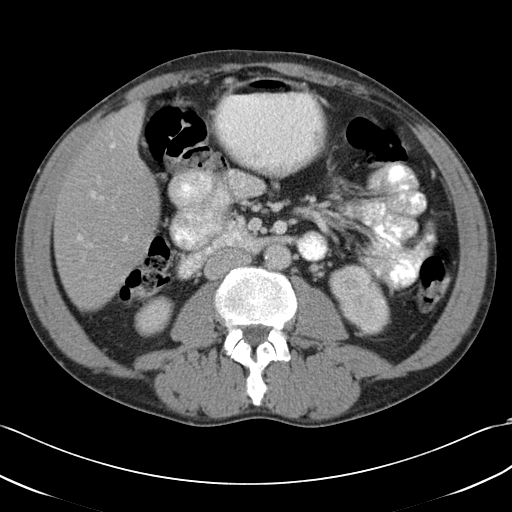
[im 52/93  soft-tissue]
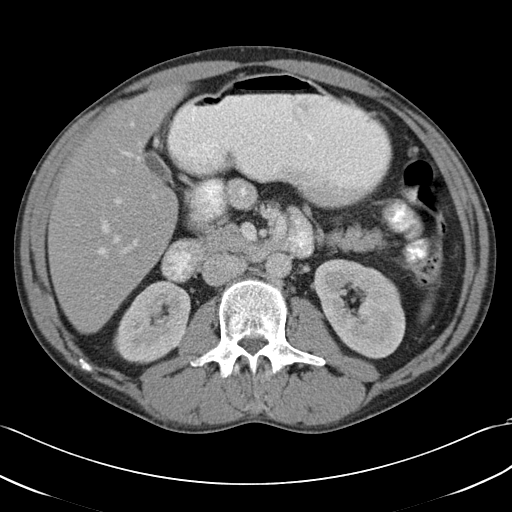
[im 62/93  soft-tissue]
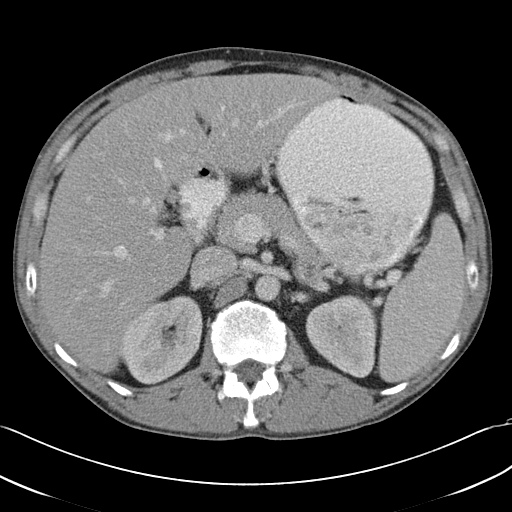
[im 62/93  bone]
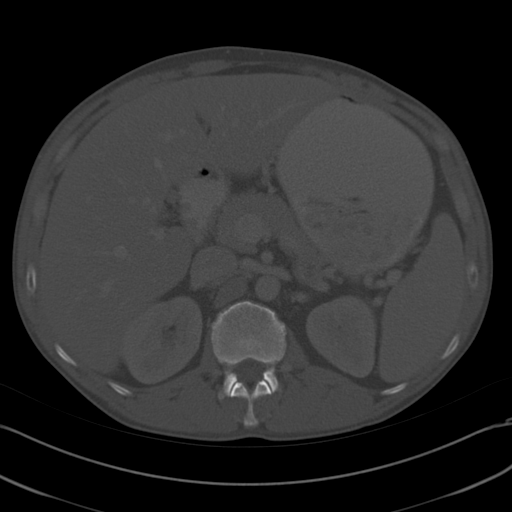
[im 67/93  soft-tissue]
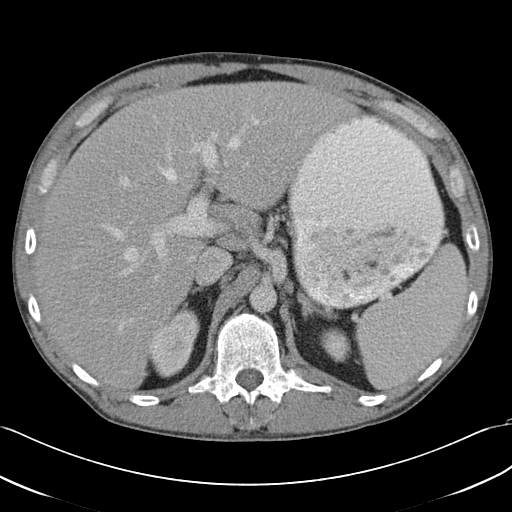
[im 72/93  soft-tissue]
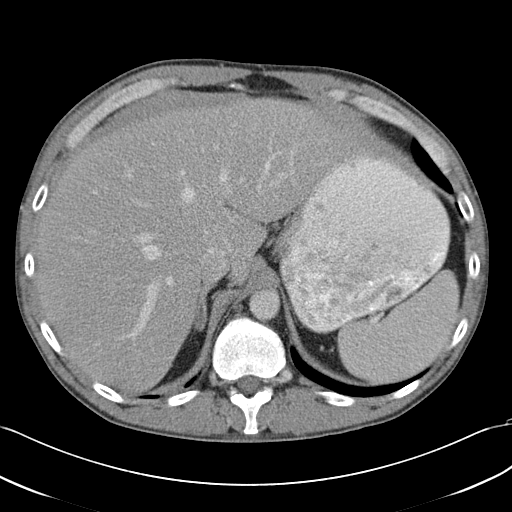
[im 72/93  lung]
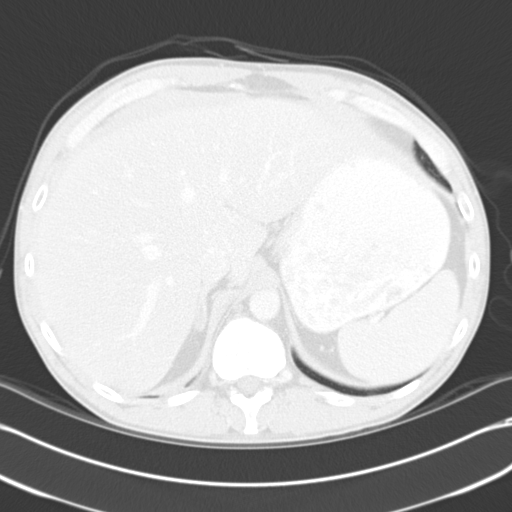
[im 77/93  lung]
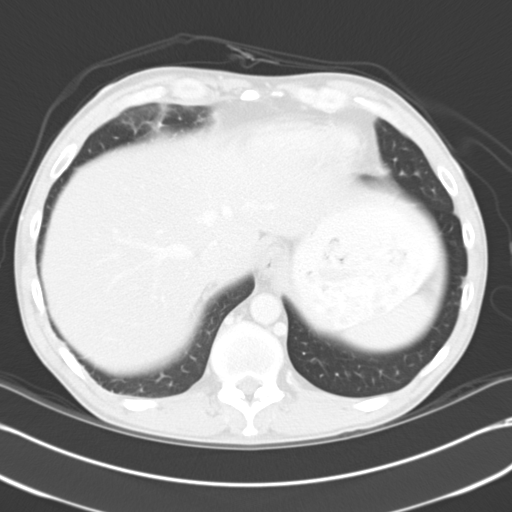
[im 82/93  soft-tissue]
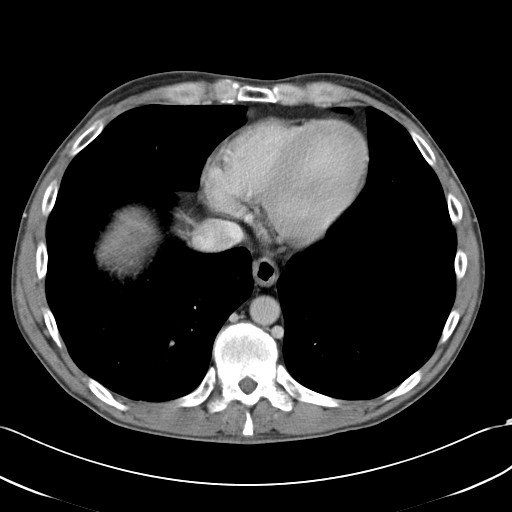
[im 82/93  lung]
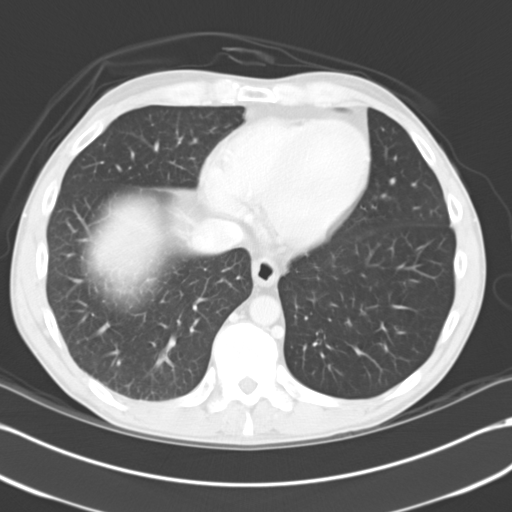
[im 87/93  soft-tissue]
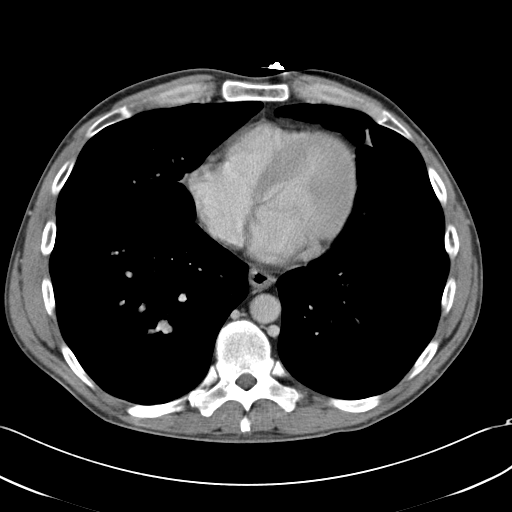
[im 87/93  lung]
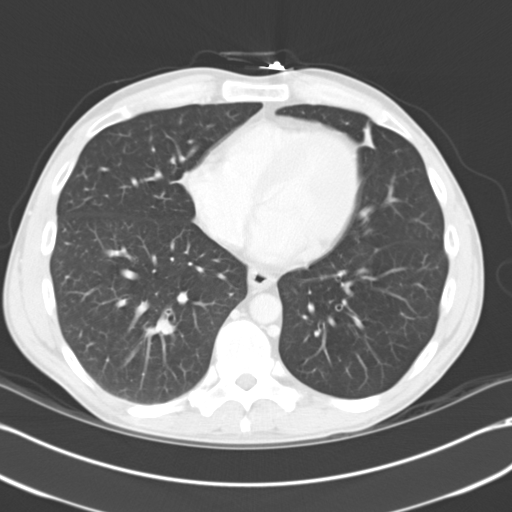

[14 of 32 positions shown; findings below may reference images not displayed]

The lung bases are clear.  There is no pleural effusion.  The liver, spleen, gallbladder, pancreas, adrenal glands and kidneys appear normal.  There is no lymphadenopathy or ascites.  No bowel wall thickening, dilatation or ascites is seen.  No vascular abnormalities are demonstrated.
IMPRESSION: Stable unremarkable CT of the abdomen.
 CT OF THE PELVIS WITH CONTRAST:
 No pelvic mass, fluid collection or inflammatory process is demonstrated.  The terminal ileum is not yet opacified, but a normal caliber appendix is identified without surrounding inflammation.  The osseous structures appear unremarkable.
IMPRESSION: Stable CT of the pelvis.  No active findings demonstrated.

## 2005-06-09 IMAGING — CR DG CHEST 2V
1 series · 3 of 3 positions shown · non-contrast
Comparison: none

REASON FOR EXAM: Fall, rib pain
COMMENTS:

[Series 1: view not recorded · 0.17mm/px · 3 of 3 slices shown]
[im 1/3]
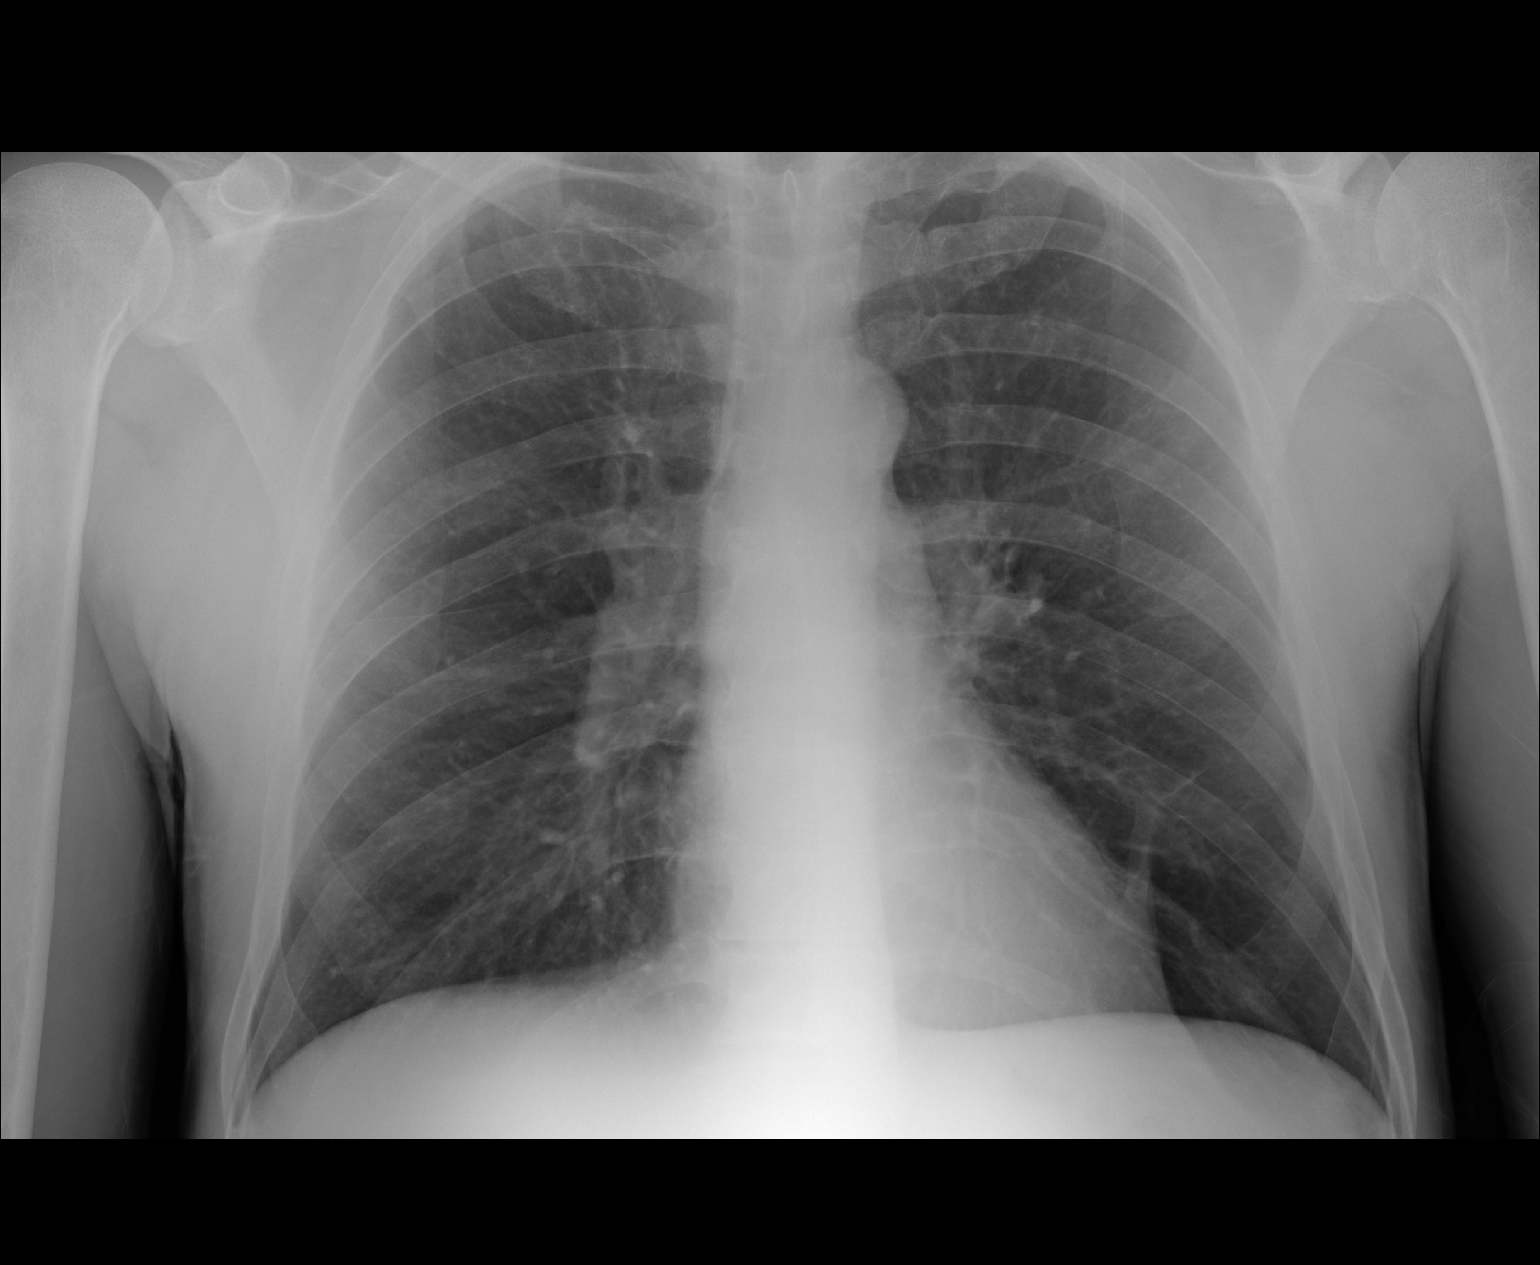
[im 2/3]
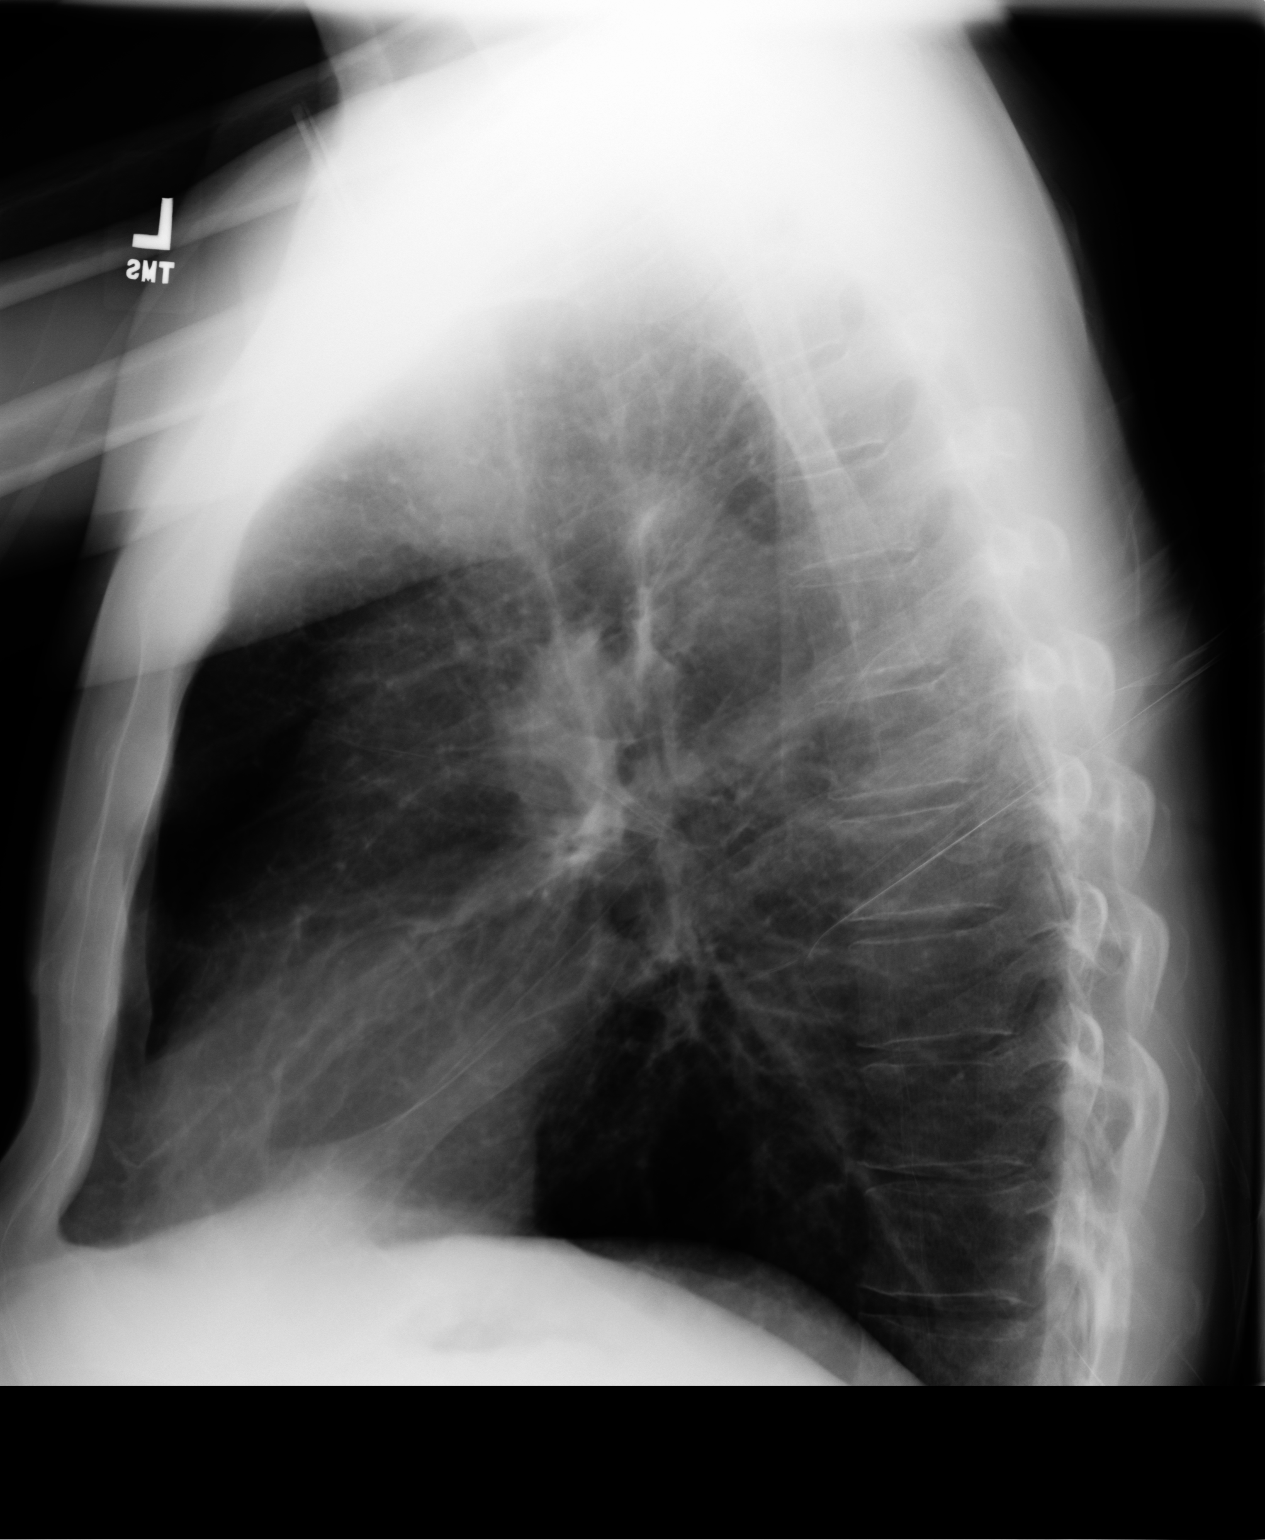
[im 3/3]
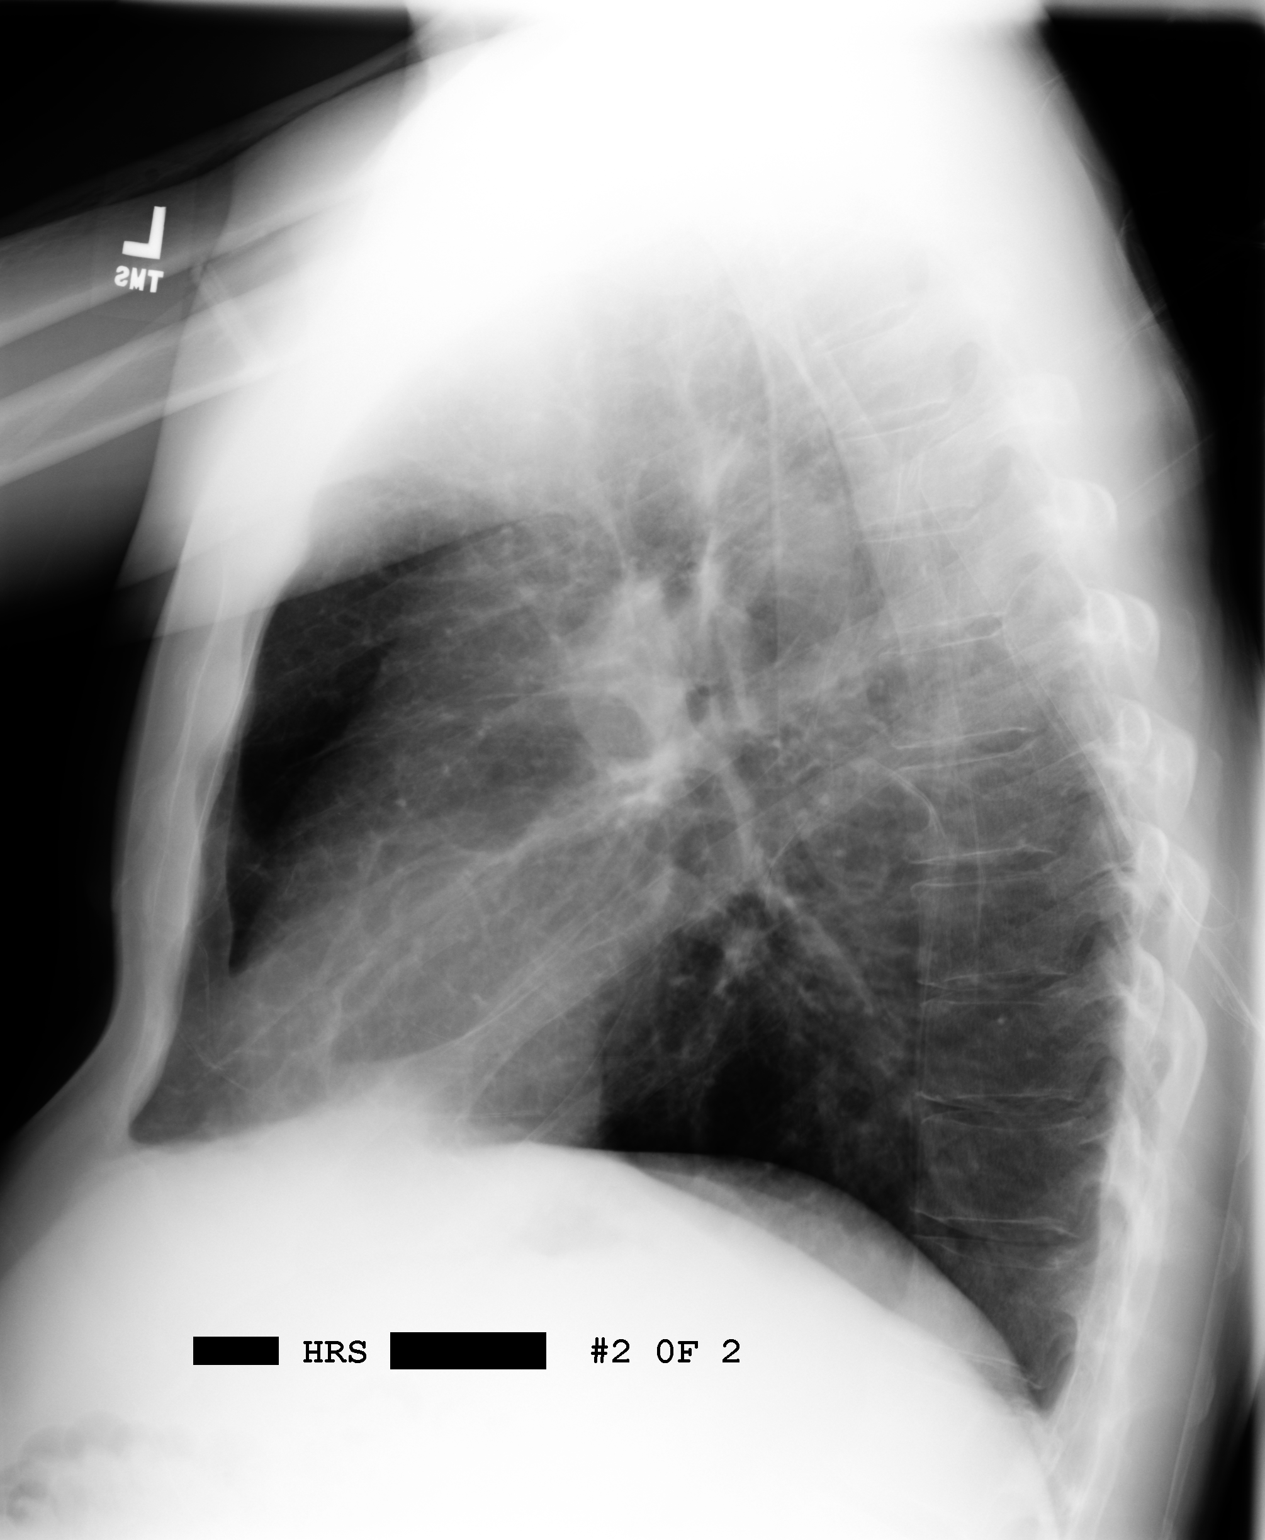

[3 of 3 positions shown; findings below may reference images not displayed]

PROCEDURE:     DXR - DXR CHEST PA (OR AP) AND LATERAL  - December 02, 2004 [DATE]

RESULT:     The heart appears normal in size.  Lung fields appear clear.
There is some hyperexpansion of the lung fields which can be compatible with
a component of COPD.  Vascularity is within normal limits. No effusions or
pneumothoraces are identified.
IMPRESSION: Possible component of COPD.

Lung fields are clear.

## 2005-06-10 ENCOUNTER — Emergency Department (HOSPITAL_COMMUNITY): Admission: EM | Admit: 2005-06-10 | Discharge: 2005-06-10 | Payer: Self-pay | Admitting: *Deleted

## 2005-06-10 IMAGING — CT CT CHEST-ABD-PELV W/ CM
1 of 2 series · 15 of 29 positions shown, 20 images · non-contrast
Comparison: none

REASON FOR EXAM: (1) fall     chest pain     rm 17; (2) fall  abd pain
 rm 17
COMMENTS:

[Series 2: soft tissue · axial · 0.74mm/px · z∈[-510,+80]mm · 15 of 134 slices shown, 20 images]
[im 8/134  mediastinal]
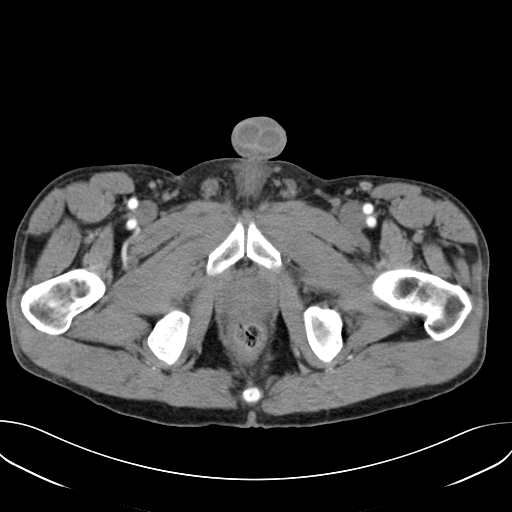
[im 8/134  bone]
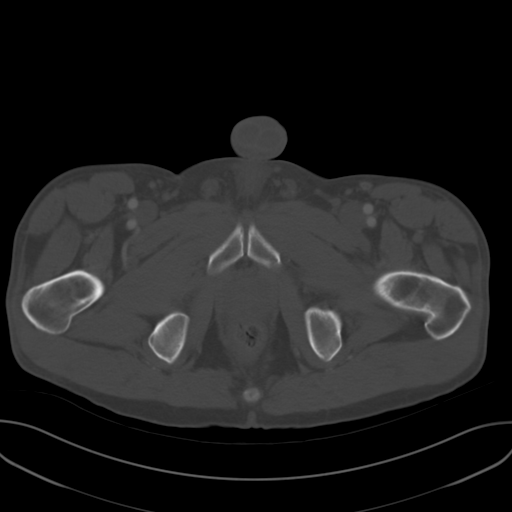
[im 15/134  mediastinal]
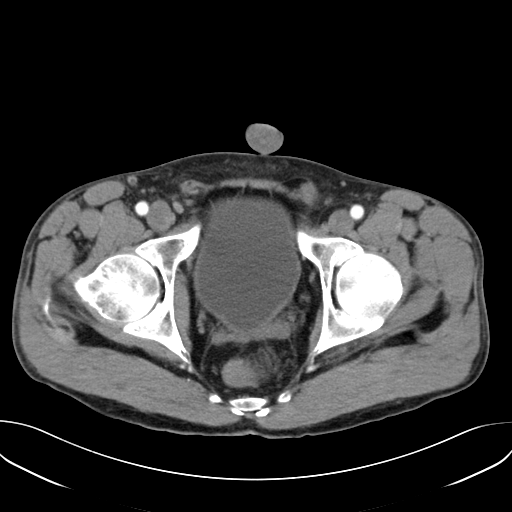
[im 30/134  mediastinal]
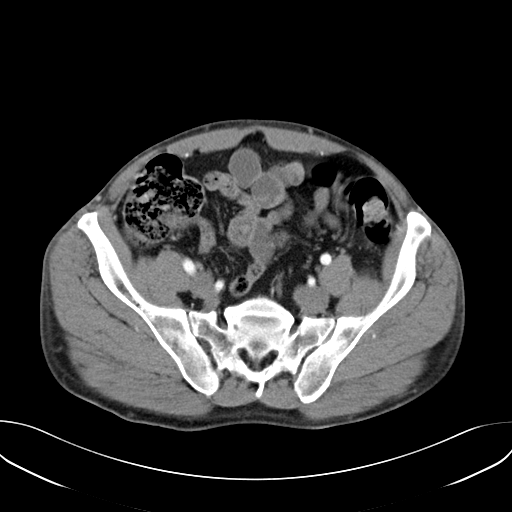
[im 37/134  mediastinal]
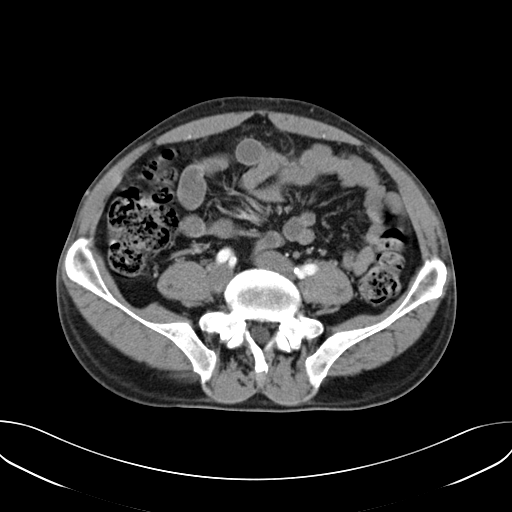
[im 45/134  mediastinal]
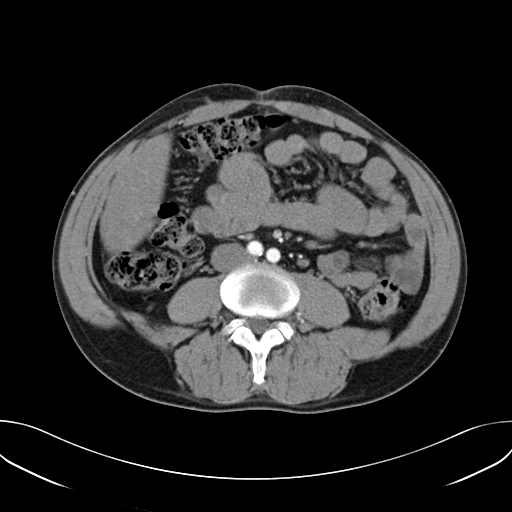
[im 60/134  mediastinal]
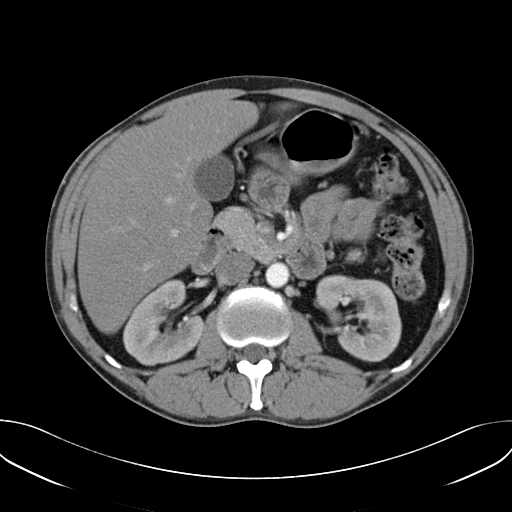
[im 66/134  mediastinal]
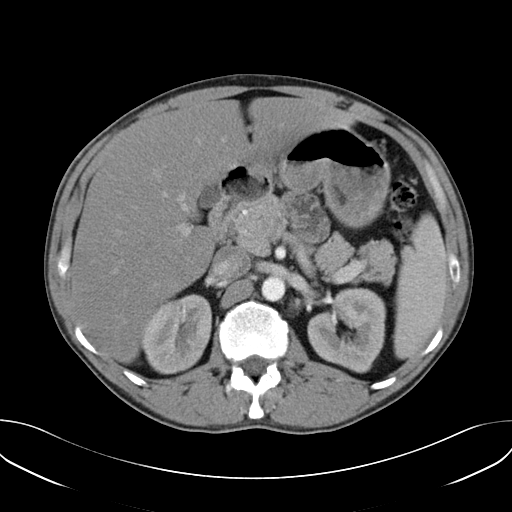
[im 67/134  mediastinal]
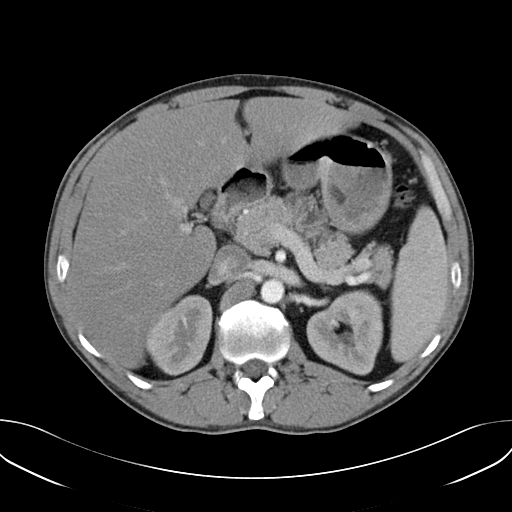
[im 74/134  mediastinal]
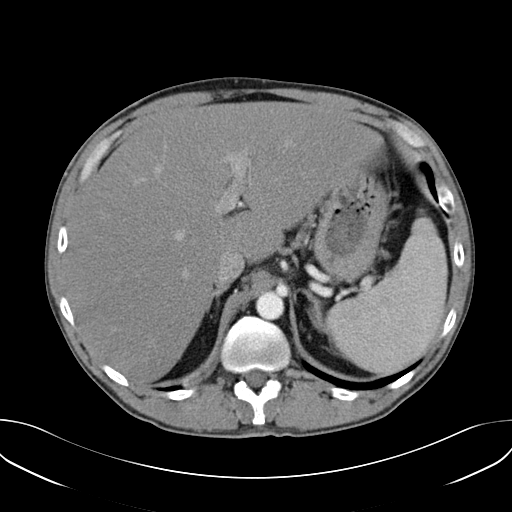
[im 74/134  bone]
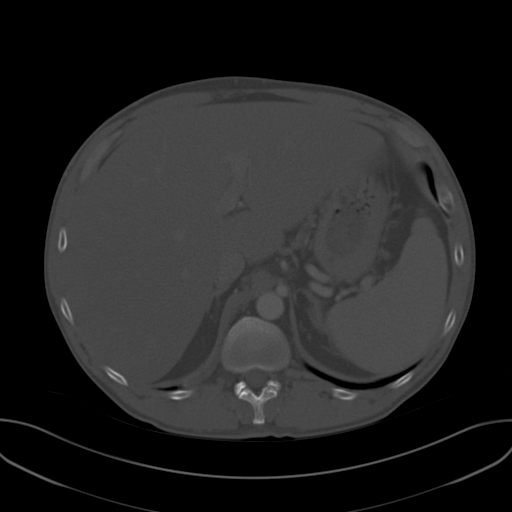
[im 89/134  mediastinal]
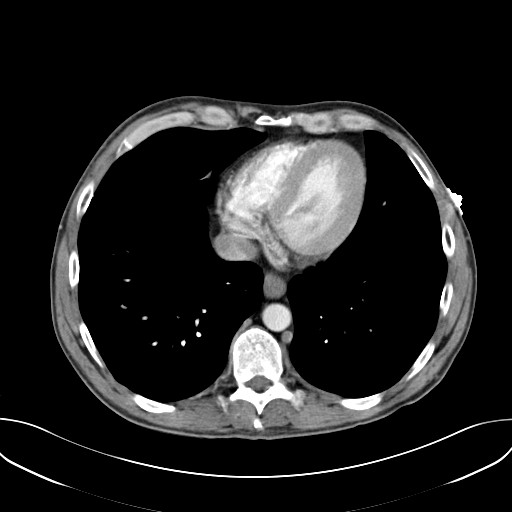
[im 97/134  mediastinal]
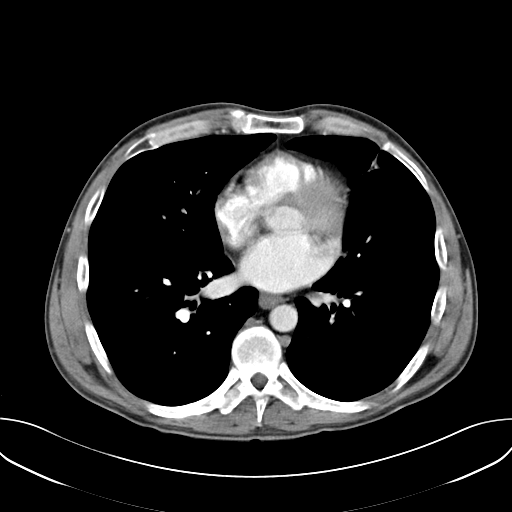
[im 104/134  mediastinal]
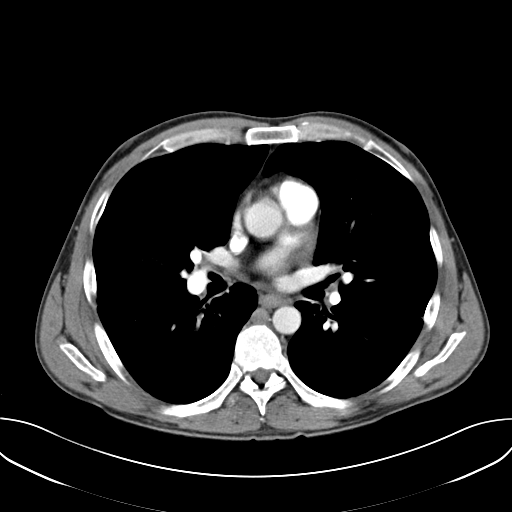
[im 104/134  lung]
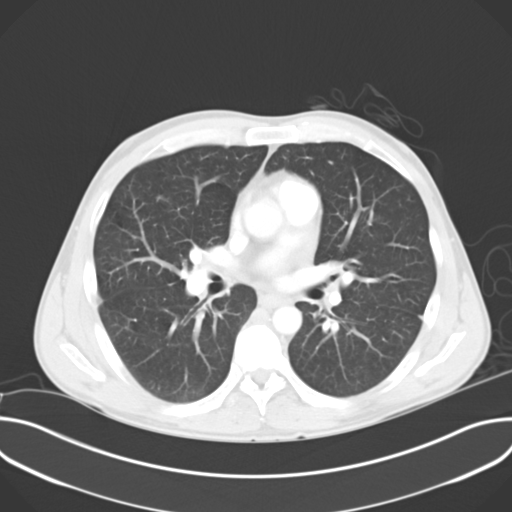
[im 111/134  lung]
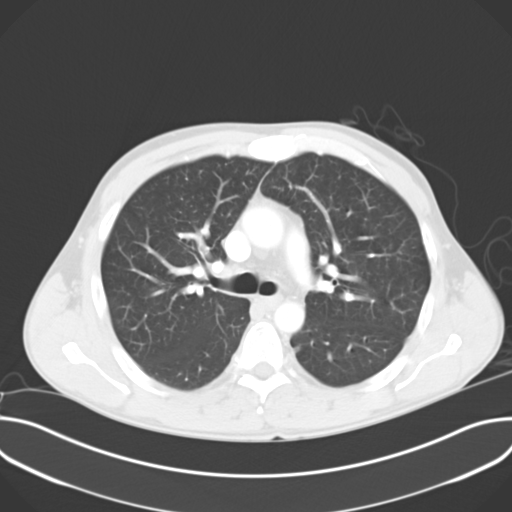
[im 119/134  mediastinal]
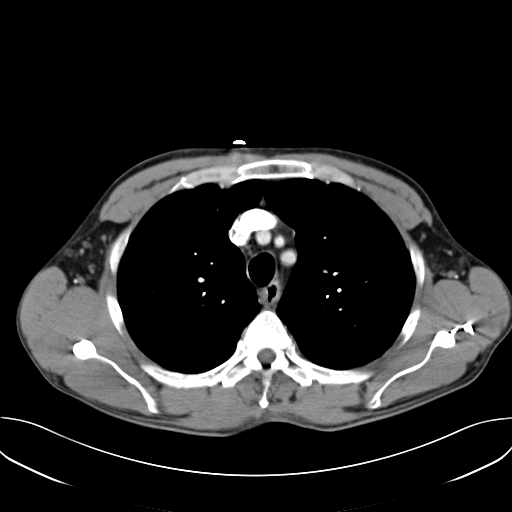
[im 119/134  lung]
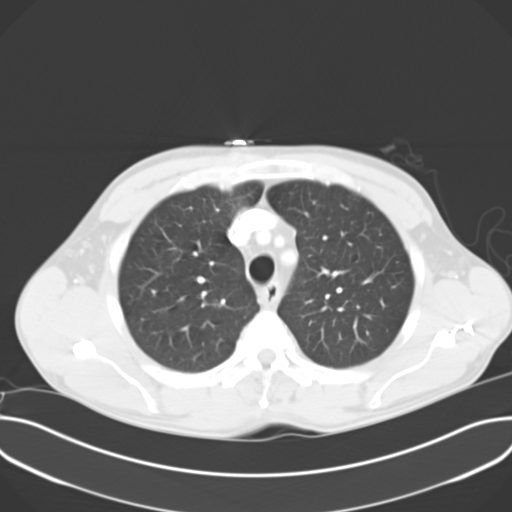
[im 126/134  mediastinal]
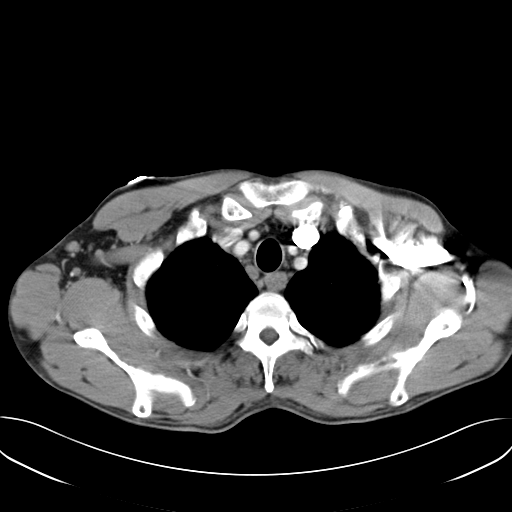
[im 126/134  lung]
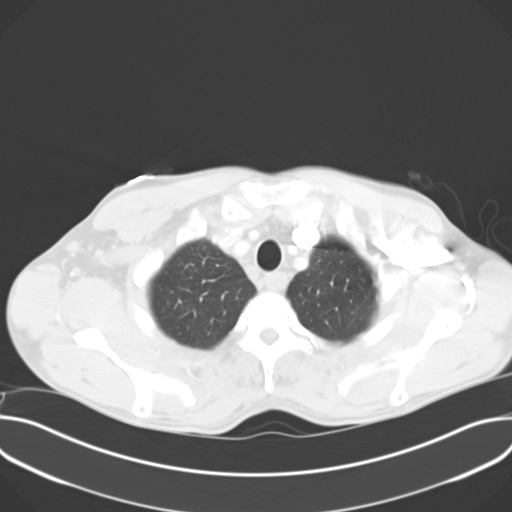

[15 of 29 positions shown; findings below may reference images not displayed]

PROCEDURE:     CT  - CT CHEST ABDOMEN AND PELVIS W  - December 03, 2004  [DATE]

RESULT:     Standard IV contrast enhanced CT of the chest, abdomen and
pelvis are obtained. The mediastinum is unremarkable. The thoracic aorta is
normal. The pulmonary arteries are normal. No hilar masses are noted. The
heart size is normal. The liver and spleen are normal. The adrenals,
pancreas and kidneys are normal. Retroperitoneum is unremarkable. There is
no bowel distention. The RIGHT lower quadrant is unremarkable.  The pelvis
is unremarkable.  Subsegmental atelectasis is noted in the lingula.
IMPRESSION: 1)No acute abnormalities identified.
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
## 2005-06-22 ENCOUNTER — Emergency Department (HOSPITAL_COMMUNITY): Admission: EM | Admit: 2005-06-22 | Discharge: 2005-06-23 | Payer: Self-pay | Admitting: Emergency Medicine

## 2005-06-24 ENCOUNTER — Emergency Department (HOSPITAL_COMMUNITY): Admission: EM | Admit: 2005-06-24 | Discharge: 2005-06-24 | Payer: Self-pay | Admitting: Podiatry
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
## 2005-11-11 IMAGING — CT CT ABDOMEN W/ CM
3 of 8 series · 14 of 46 positions shown, 19 images · IV contrast ([ID] OMNI 300)
Comparison: 04/10/05.

CLINICAL DATA: Fell from ladder.
CHEST CT WITH CONTRAST:
TECHNIQUE: Multidetector CT imaging of the chest was performed following the standard protocol during bolus administration of intravenous contrast.
Contrast:  217cc Omnipaque 300
TECHNIQUE: Multidetector CT imaging of the abdomen was performed following the standard protocol during bolus administration of intravenous contrast.
TECHNIQUE: Multidetector CT imaging of the pelvis was performed following the standard protocol during bolus administration of intravenous contrast.

[Series 2: chest abdomen pelvis · axial · 0.70mm/px · z∈[-599,-69]mm · 8 of 147 slices shown]
[im 18/147  soft-tissue]
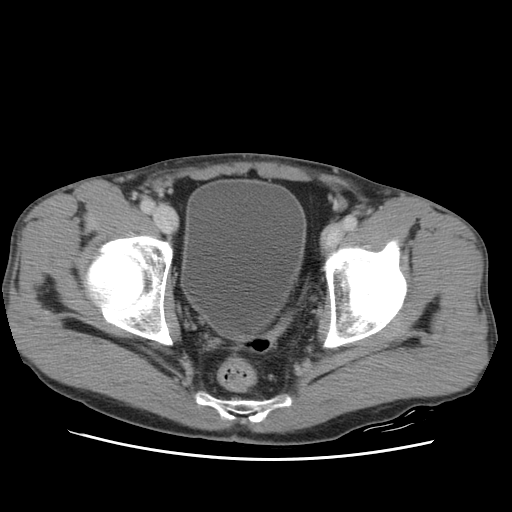
[im 35/147  soft-tissue]
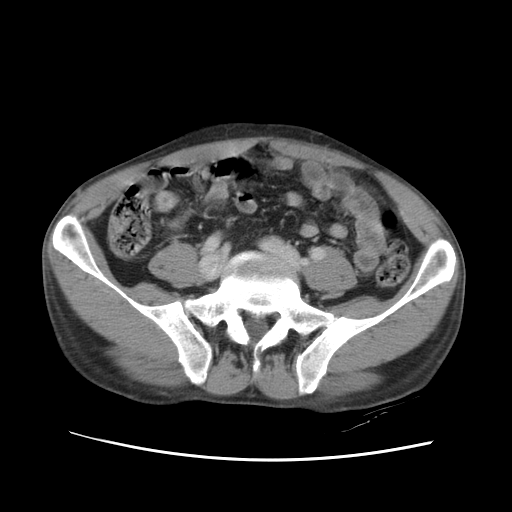
[im 52/147  soft-tissue]
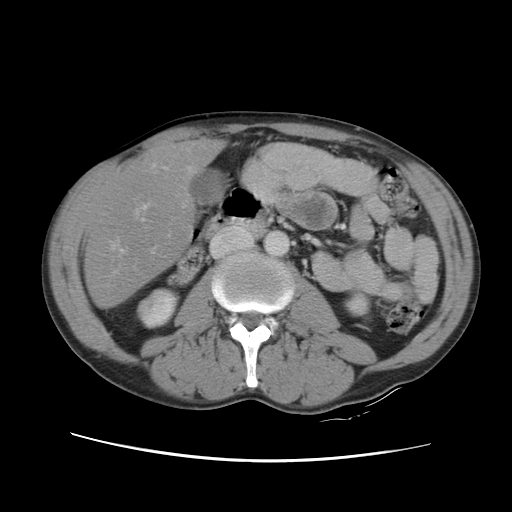
[im 69/147  soft-tissue]
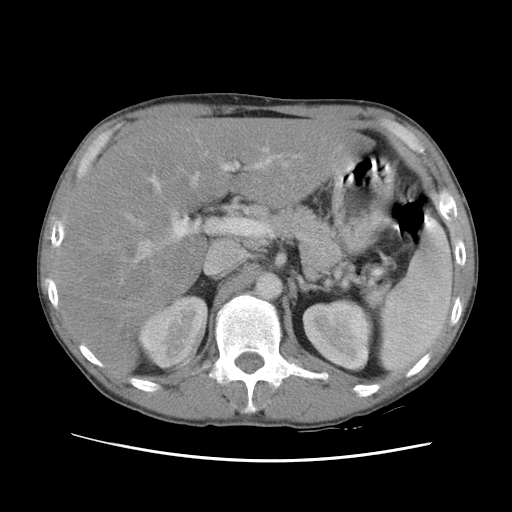
[im 86/147  soft-tissue]
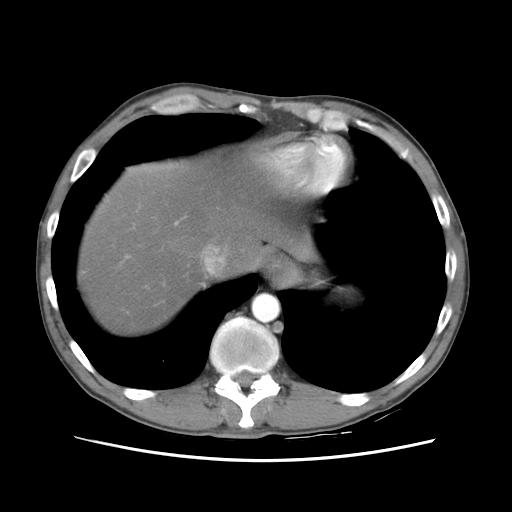
[im 104/147  soft-tissue]
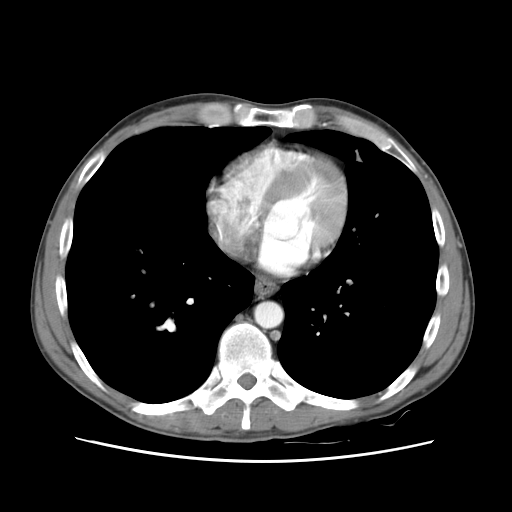
[im 121/147  soft-tissue]
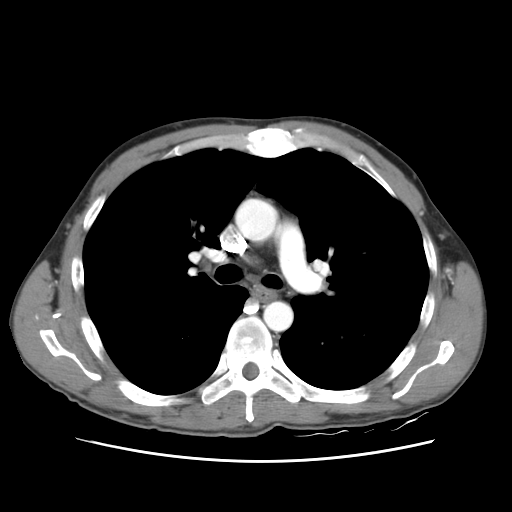
[im 138/147  soft-tissue]
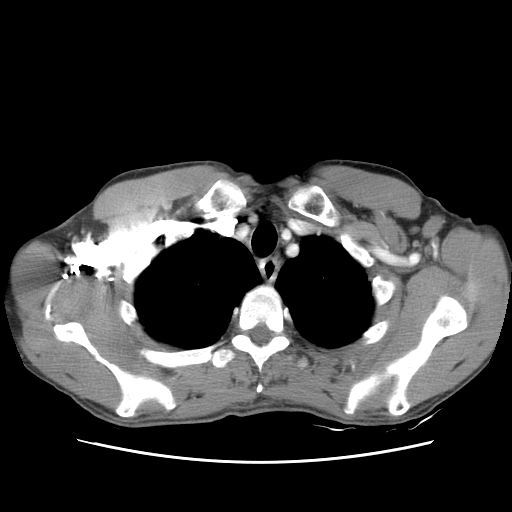

[Series 4: renal delays · axial · 0.70mm/px · z∈[-439,-319]mm · 3 of 25 slices shown, 7 images]
[im 1/25  soft-tissue]
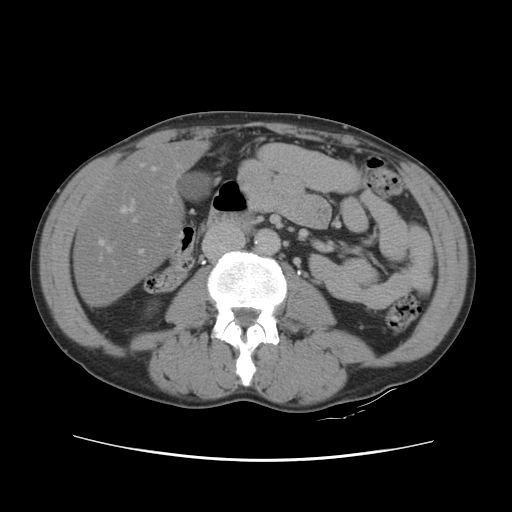
[im 1/25  lung]
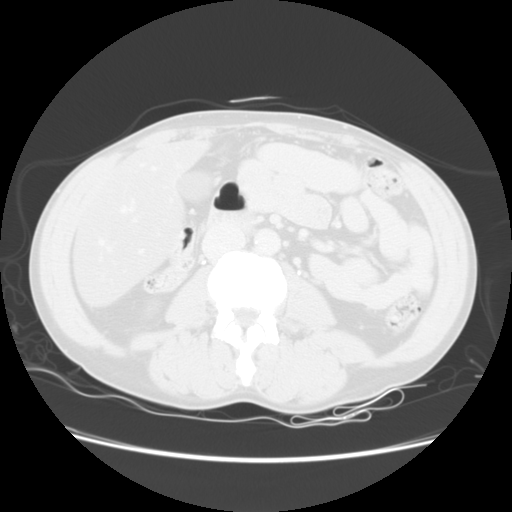
[im 1/25  bone]
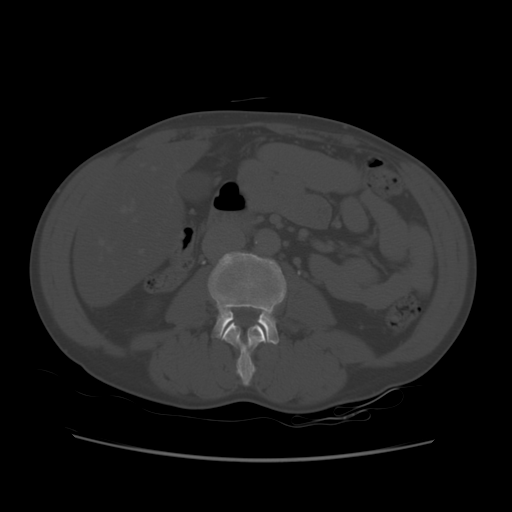
[im 13/25  soft-tissue]
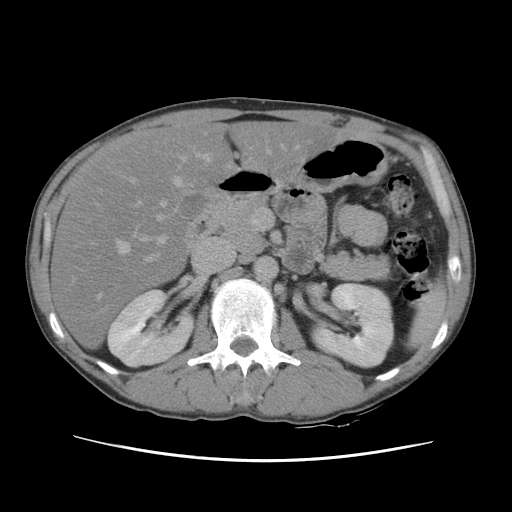
[im 13/25  lung]
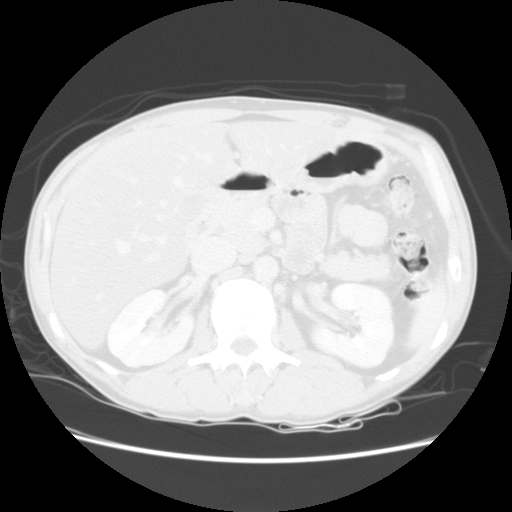
[im 25/25  soft-tissue]
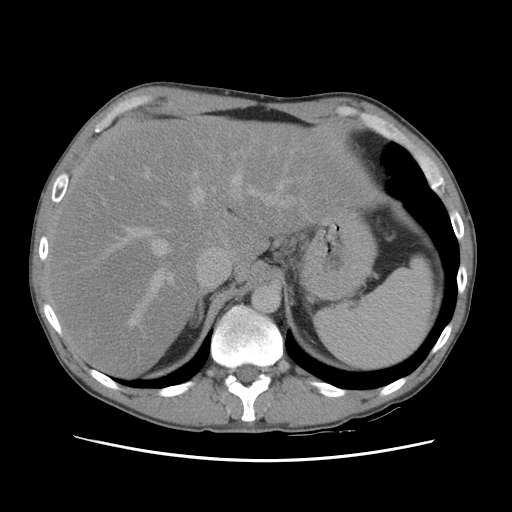
[im 25/25  lung]
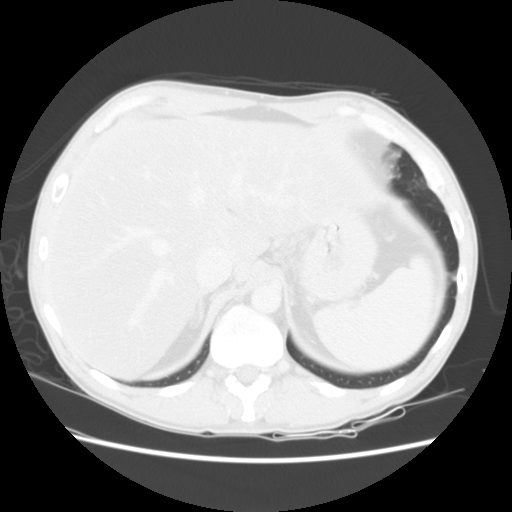

[Series 104: reformatted · coronal · 0.70mm/px · 3 of 108 slices shown, 4 images]
[im 27/108  soft-tissue]
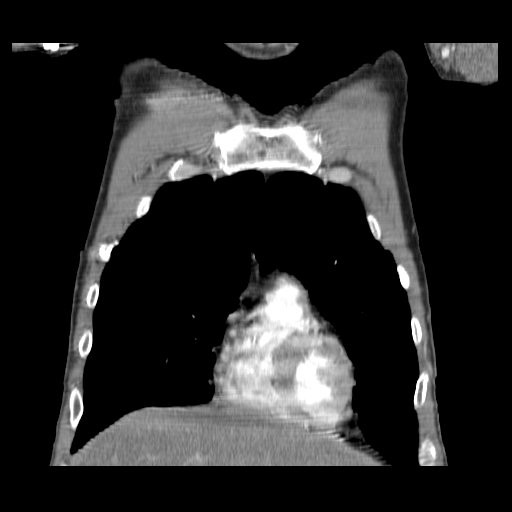
[im 54/108  soft-tissue]
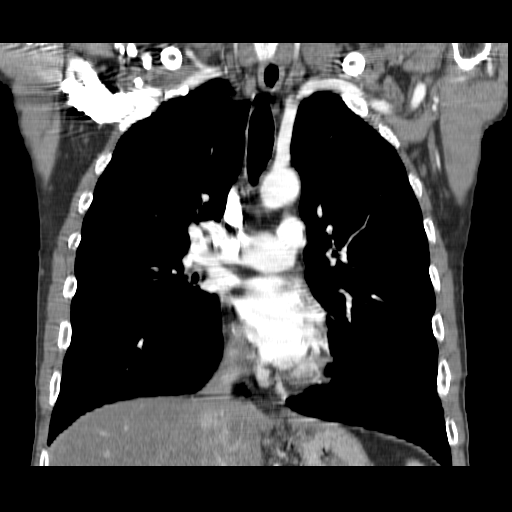
[im 54/108  bone]
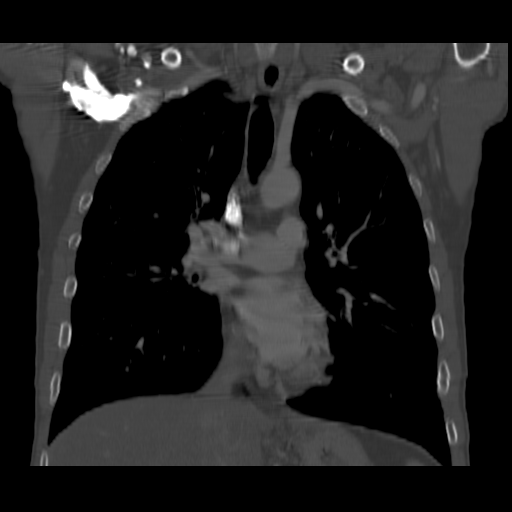
[im 81/108  soft-tissue]
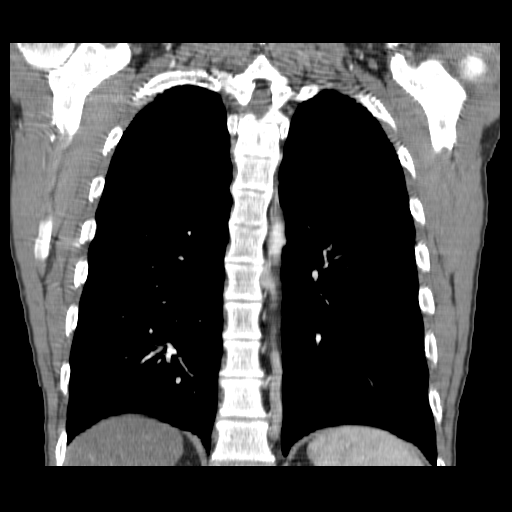

[14 of 46 positions shown; findings below may reference images not displayed]

FINDINGS: No pathologically enlarged axillary lymphadenopathy.  No pathologically enlarged mediastinal or hilar lymphadenopathy.  No pleural or pericardial effusion.  No suspicious pulmonary nodules or masses noted.  There are no pneumothoraces identified.  
No acute osseous abnormalities are noted.
IMPRESSION: No acute findings. 
ABDOMEN CT WITH CONTRAST:
FINDINGS: The liver is normal in attenuation and morphology.  
Spleen is negative.  
Adrenal glands are negative. 
The pancreas is negative. 
Right kidney negative. 
Left kidney negative. 
No free abdominal fluid.  
The visualized bowel loops are unremarkable.
IMPRESSION: No acute abdomen CT findings.  
PELVIS CT WITH CONTRAST:
FINDINGS: No pelvic fluid.  
No pelvic lymphadenopathy.  Visualized pelvic bowel loops negative.  No inguinal lymphadenopathy.  
Review of bone windows demonstrates no fractures.
IMPRESSION: No acute findings.

## 2005-11-11 IMAGING — CR DG HAND COMPLETE 3+V*L*
3 series · 3 of 3 positions shown · non-contrast
Comparison: 09/20/03.

CLINICAL DATA: Fell off a ladder.  Hand pain, particularly medially.  
 LEFT HAND - 3 VIEW:

[view not recorded (1 of 3)]
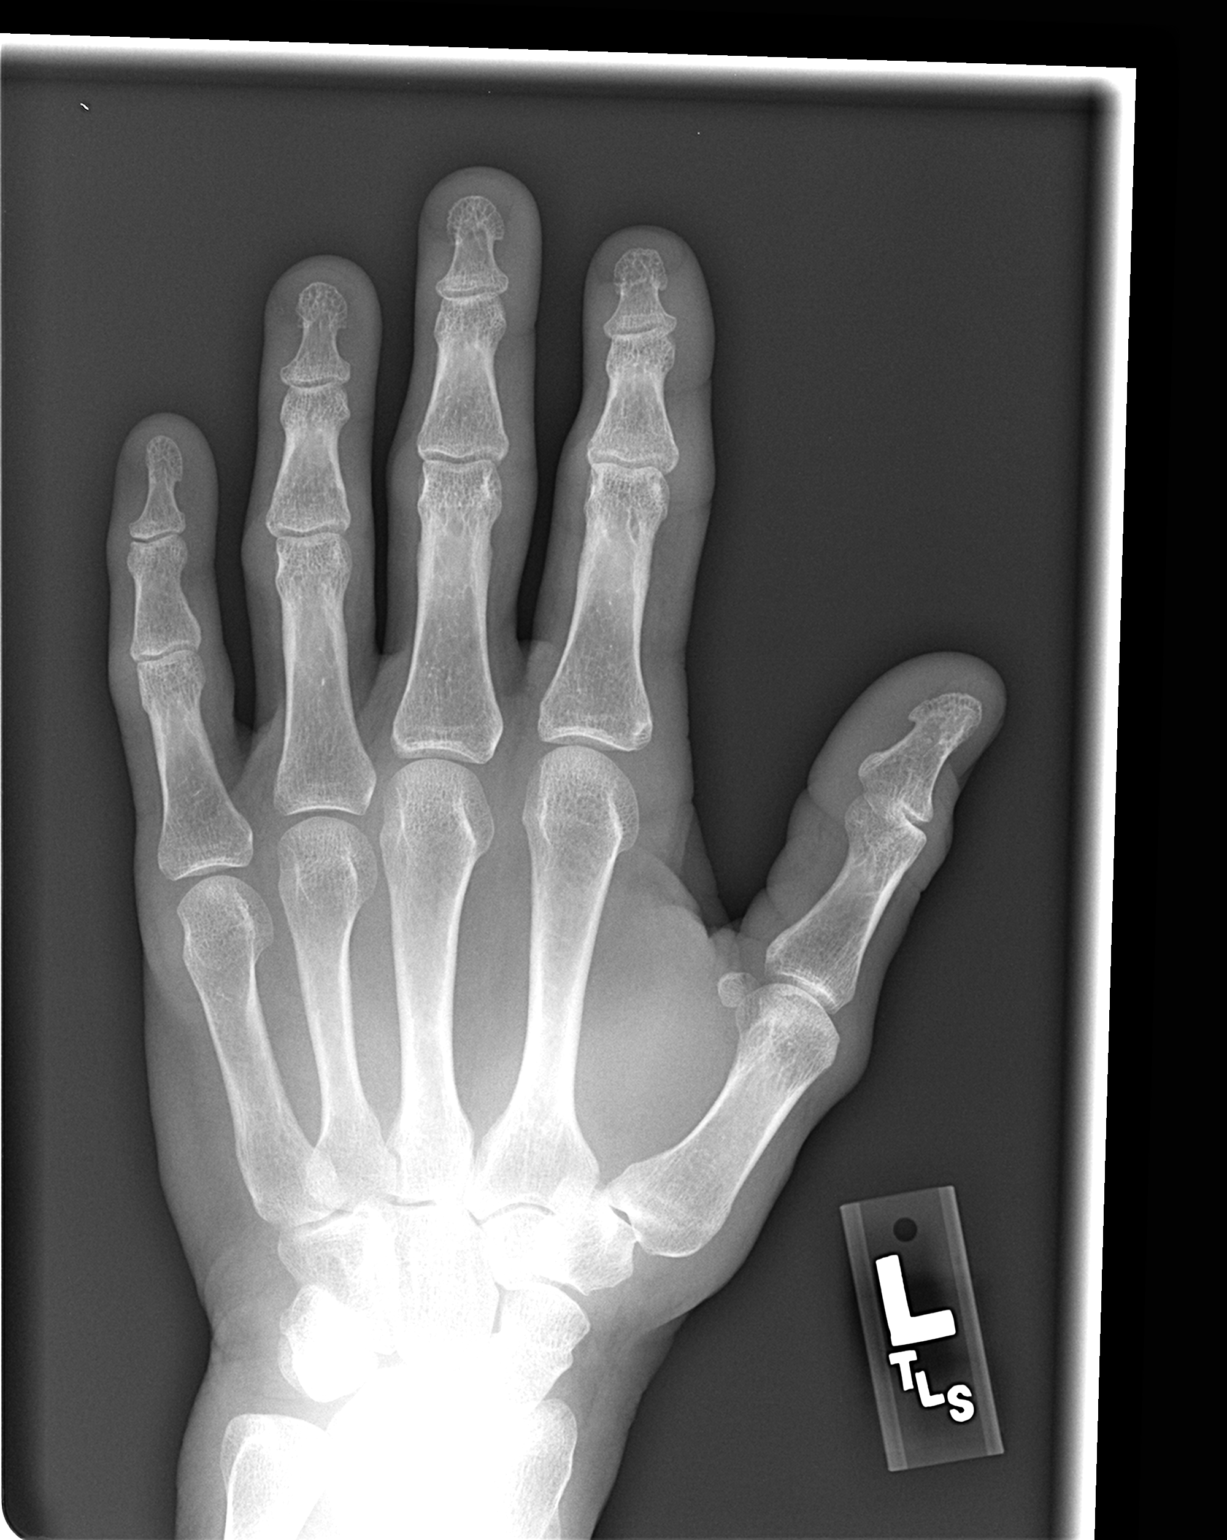

[view not recorded (2 of 3)]
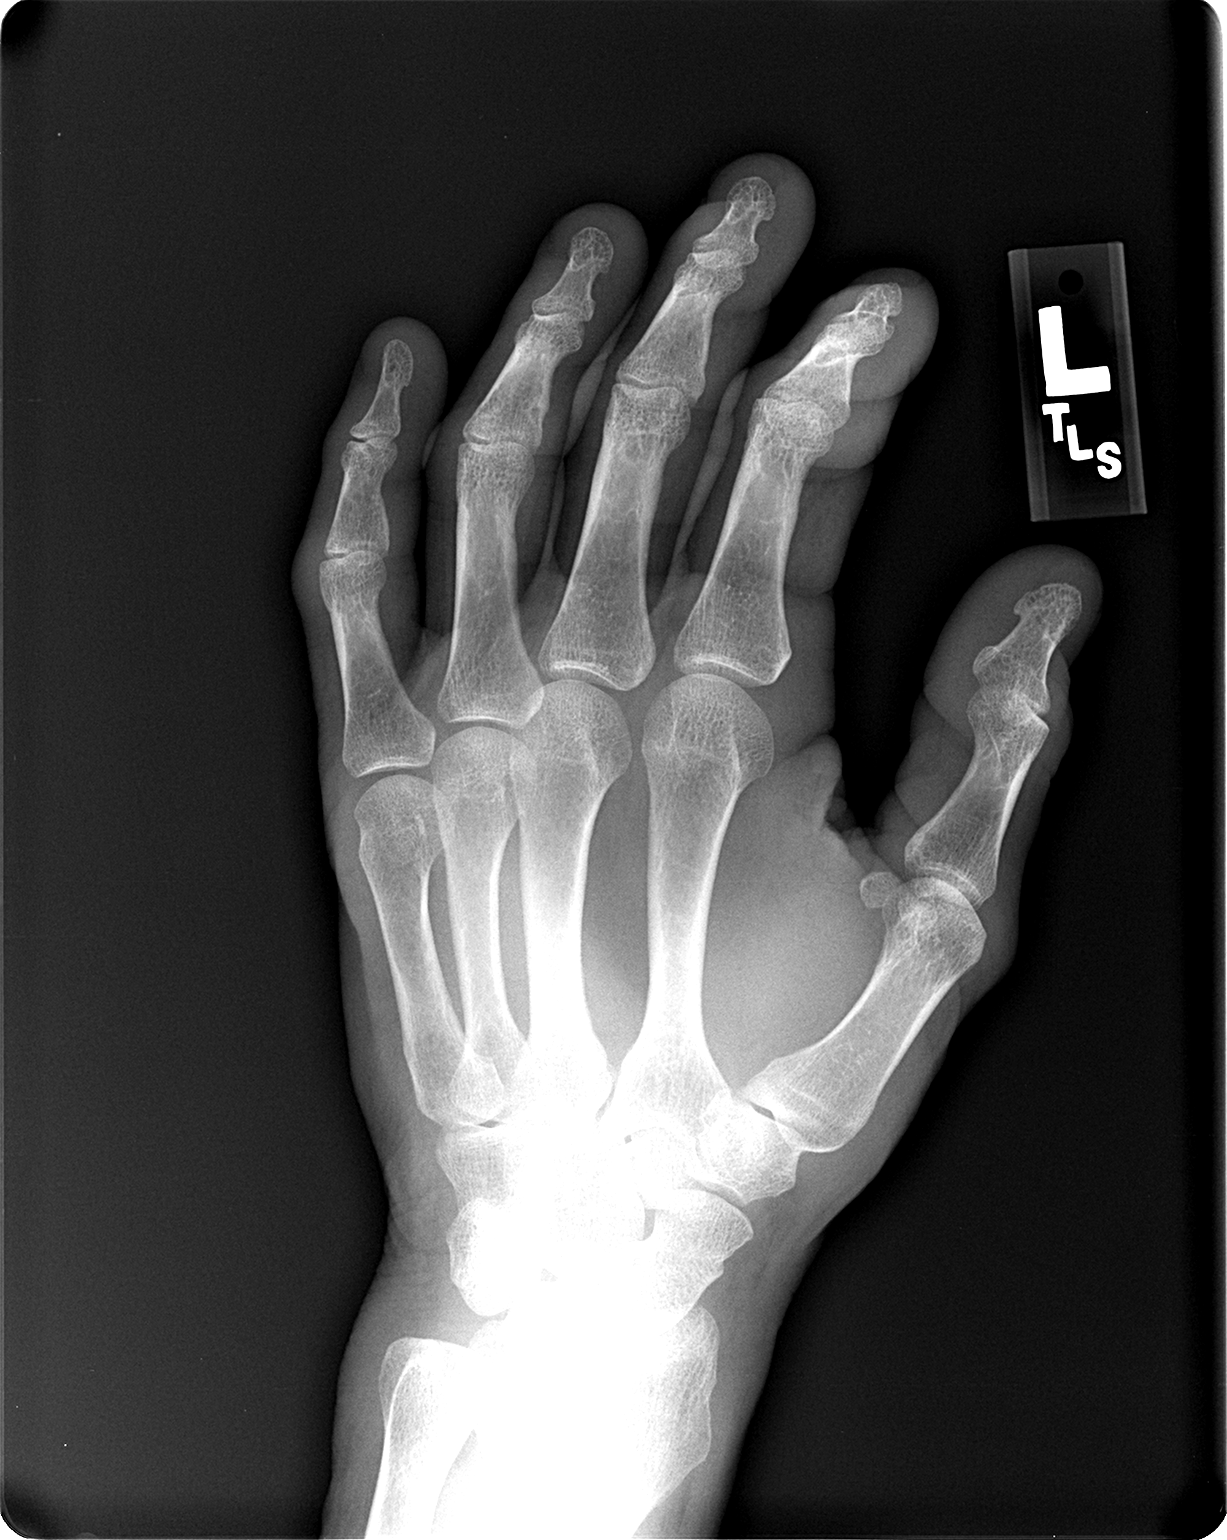

[view not recorded (3 of 3)]
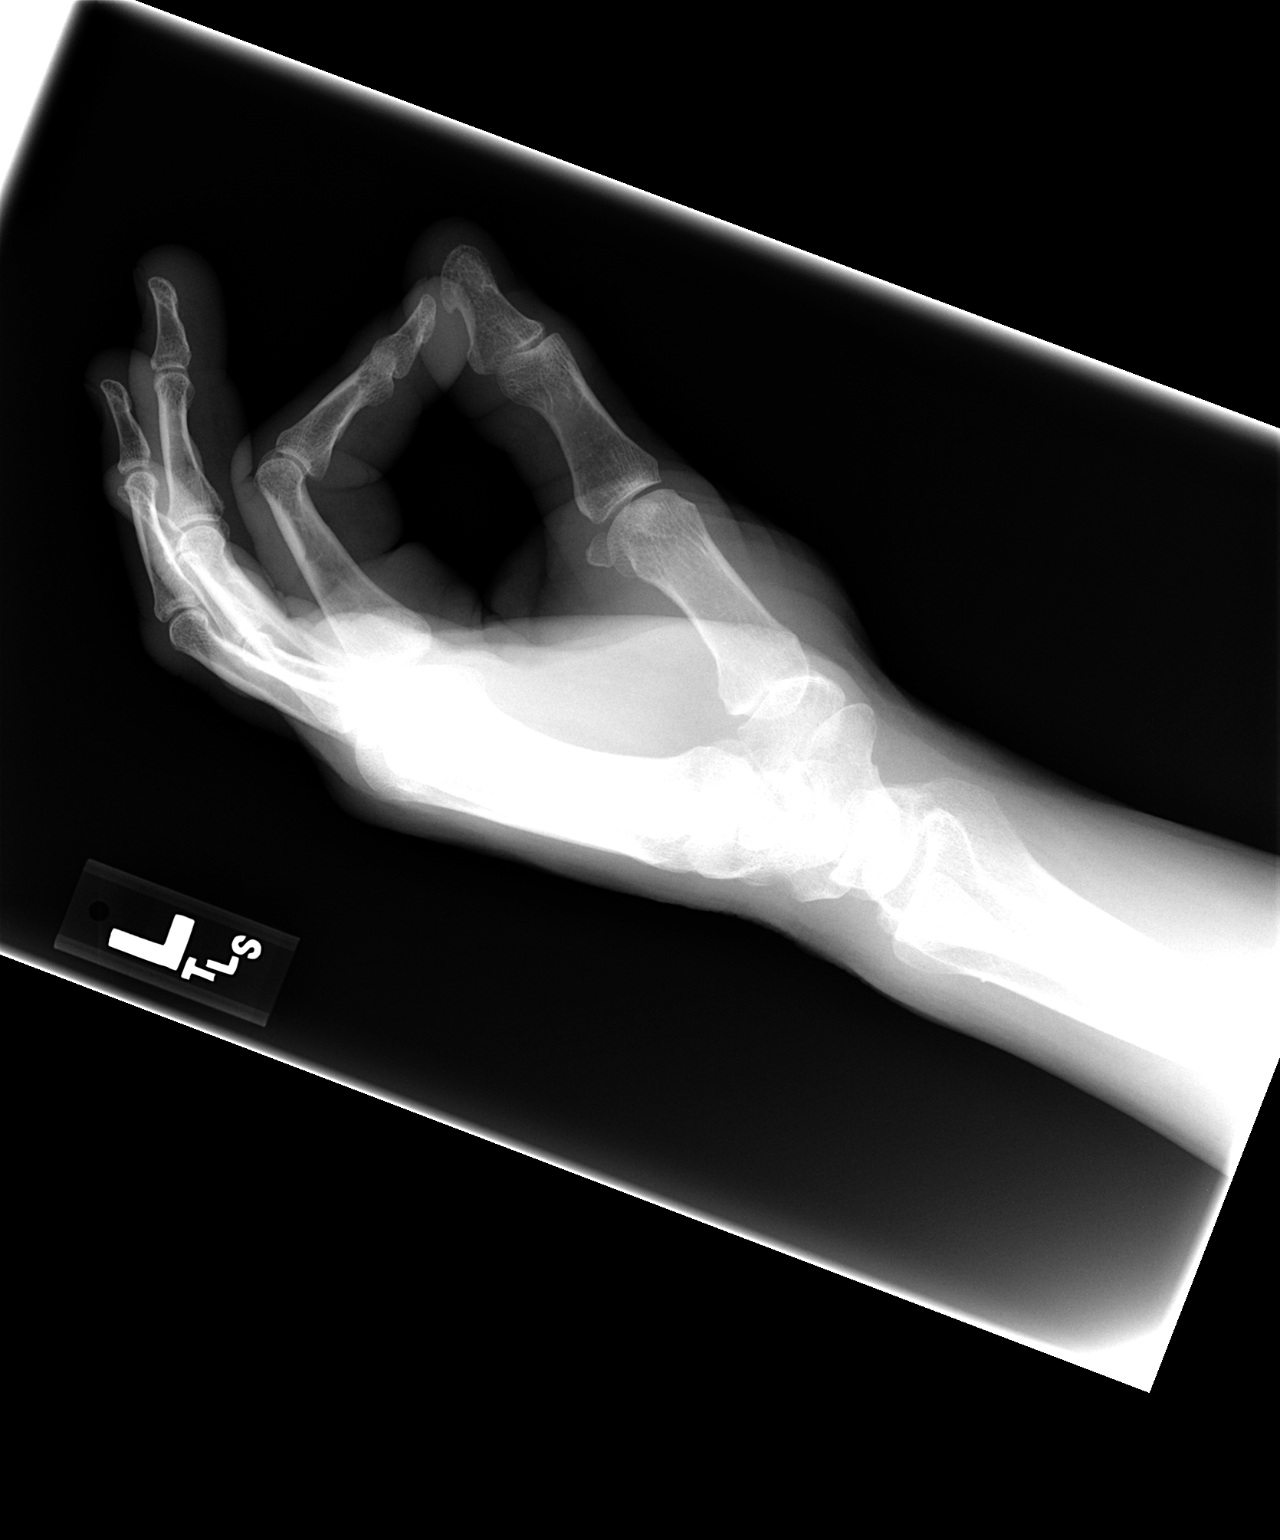

[3 of 3 positions shown; findings below may reference images not displayed]

FINDINGS: No evidence of fracture or dislocation.  I think there is some soft tissue swelling of the fifth finger, but I do not see a specific etiology.
IMPRESSION: Negative skeletal evaluation.

## 2005-11-14 IMAGING — CT CT ABDOMEN W/ CM
2 of 4 series · 13 of 32 positions shown, 19 images · IV contrast (omnipaque)
Comparison: Abdominal pelvic CT 05/06/05

CLINICAL DATA: Left-sided rib pain, trauma, question of splenic injury.
ABDOMEN CT WITH CONTRAST:
TECHNIQUE: Multidetector CT imaging of the abdomen was performed following the standard protocol during bolus administration of intravenous contrast.
Contrast:  125 cc Omnipaque 300

[Series 2: abdomen 5.0 b40f st · axial · 0.65mm/px · z∈[-328,-118]mm · 11 of 52 slices shown, 17 images]
[im 5/52  soft-tissue]
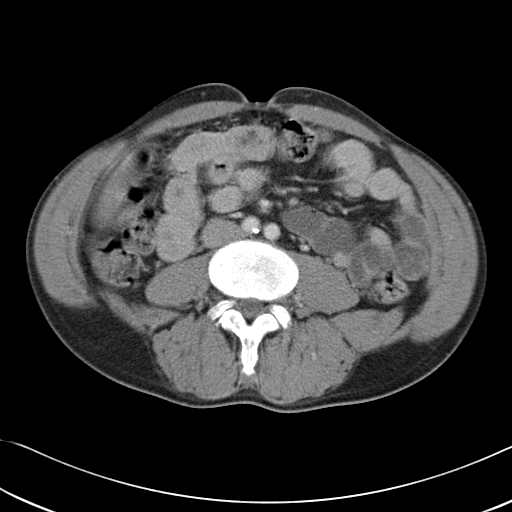
[im 5/52  bone]
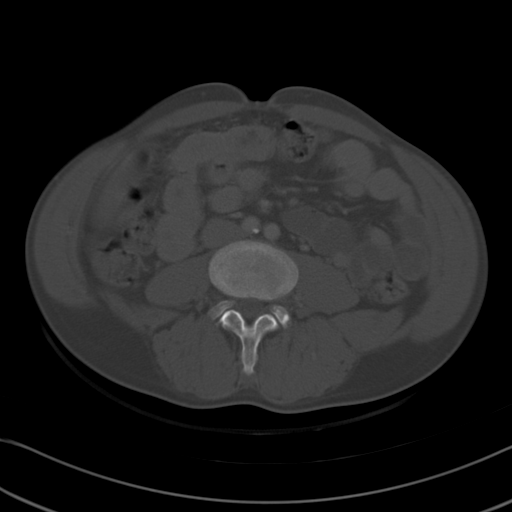
[im 9/52  soft-tissue]
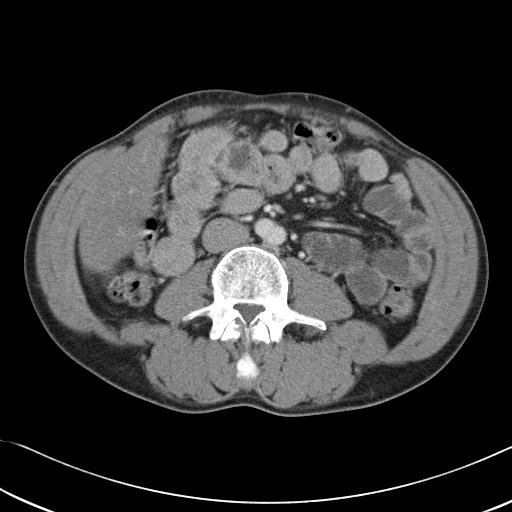
[im 13/52  soft-tissue]
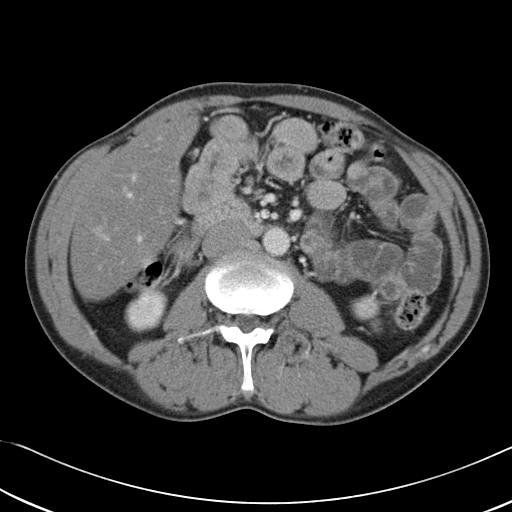
[im 18/52  soft-tissue]
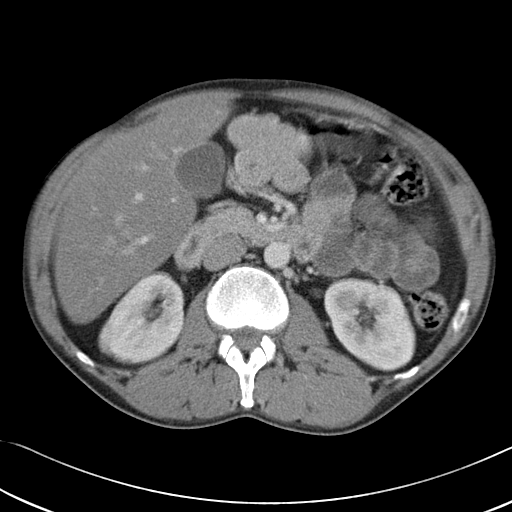
[im 22/52  soft-tissue]
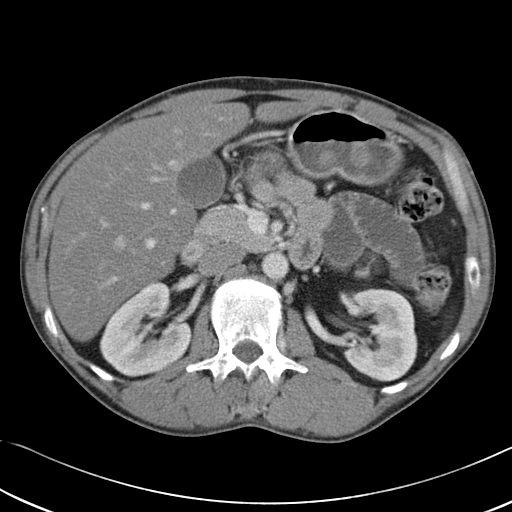
[im 26/52  soft-tissue]
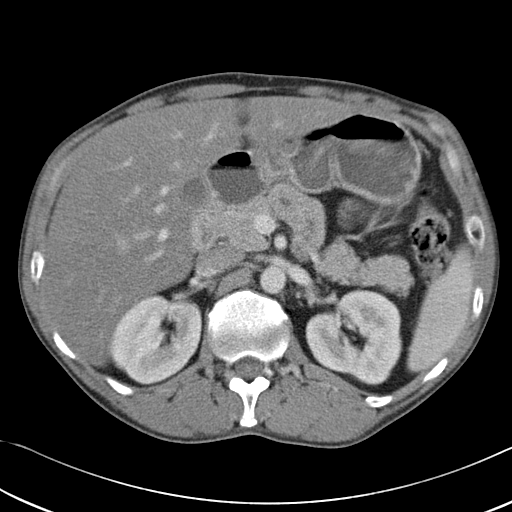
[im 30/52  soft-tissue]
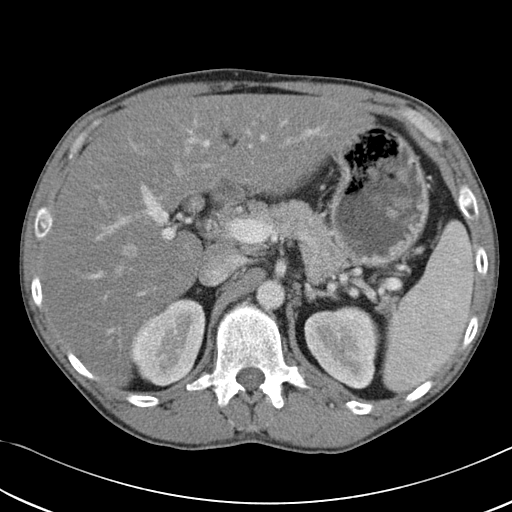
[im 35/52  soft-tissue]
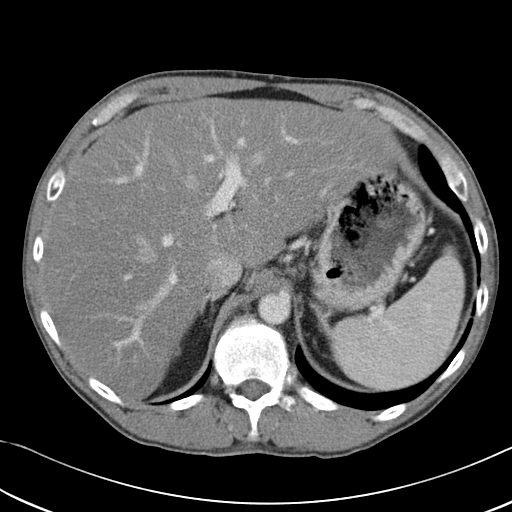
[im 35/52  lung]
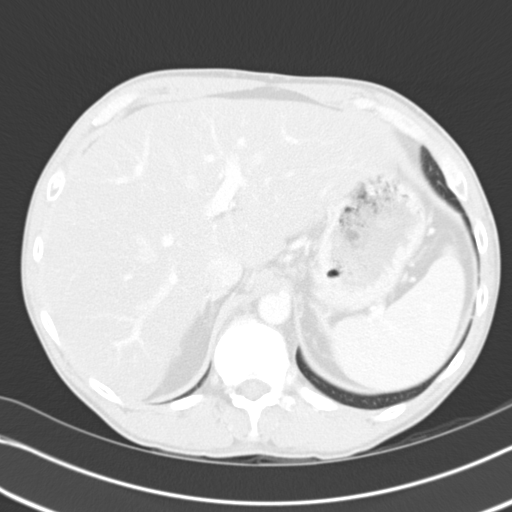
[im 39/52  soft-tissue]
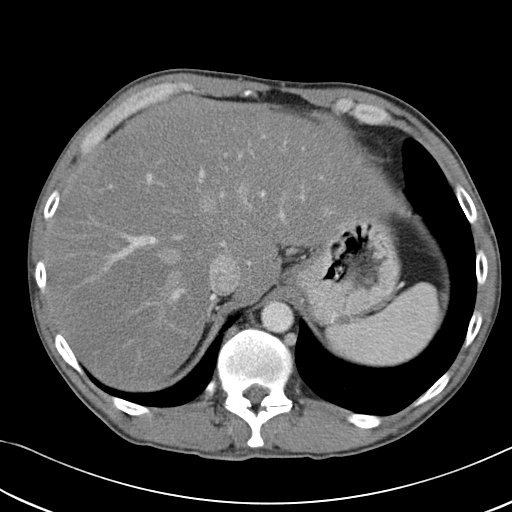
[im 39/52  lung]
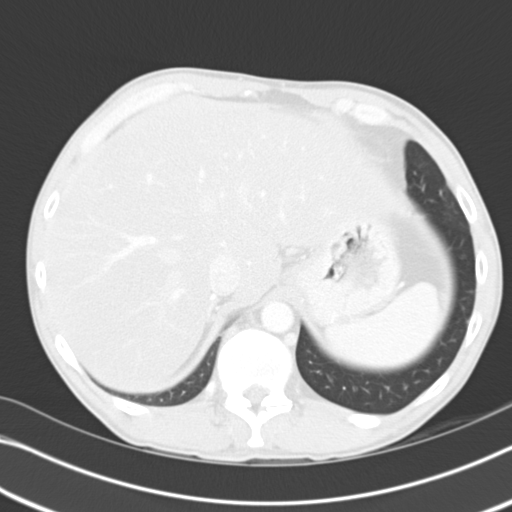
[im 39/52  bone]
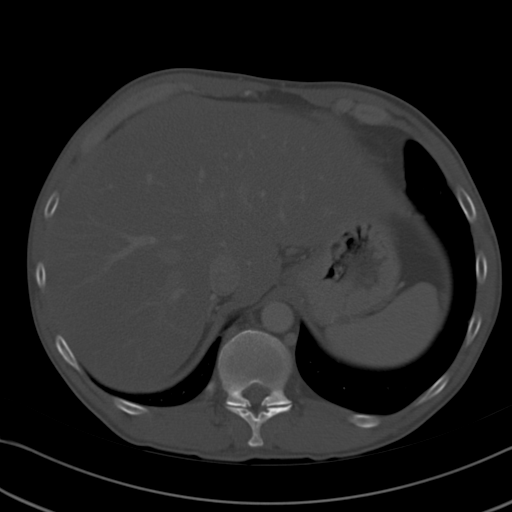
[im 43/52  soft-tissue]
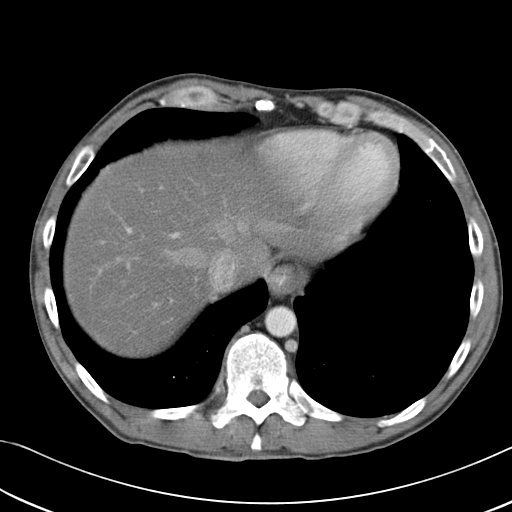
[im 43/52  lung]
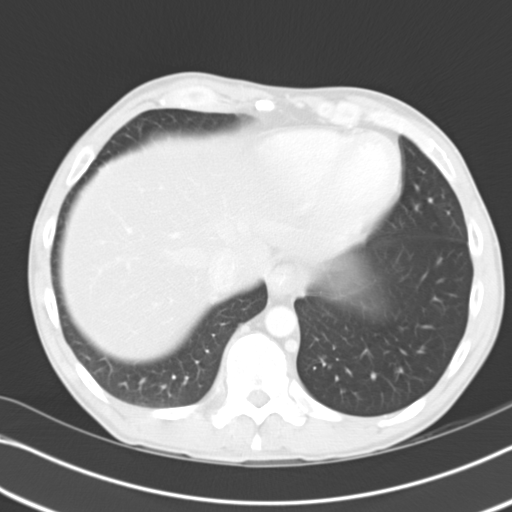
[im 47/52  soft-tissue]
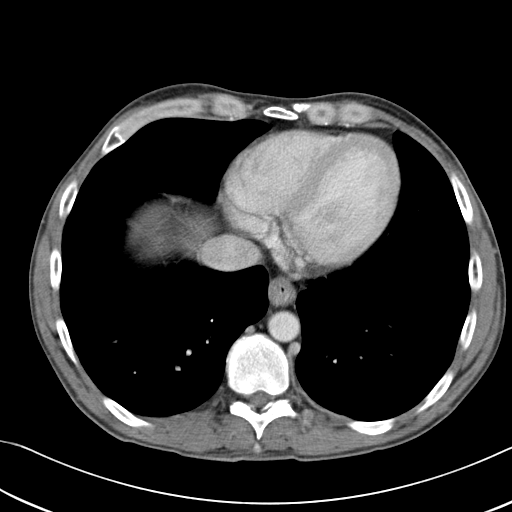
[im 47/52  lung]
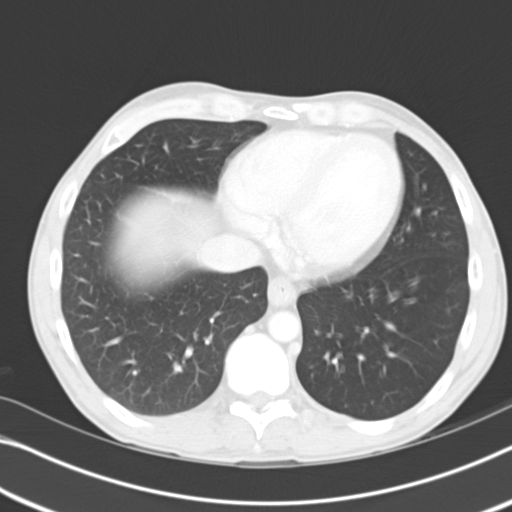

[Series 5: abdomen 5.0 b60f lung · axial · 0.65mm/px · z∈[-168,-143]mm · 2 of 21 slices shown]
[im 6/21  bone]
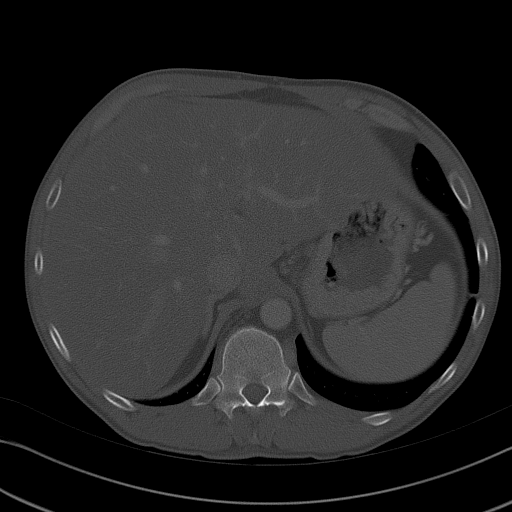
[im 11/21  bone]
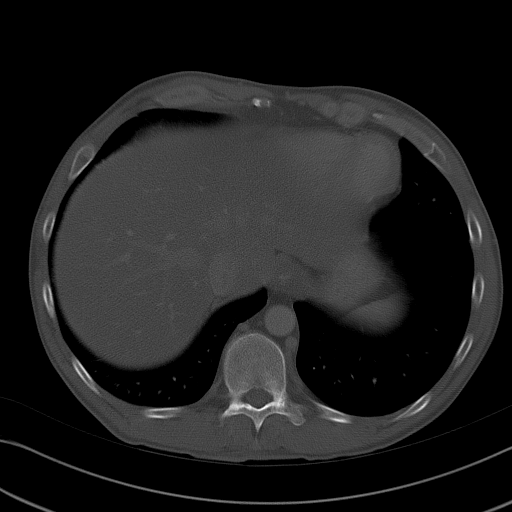

[13 of 32 positions shown; findings below may reference images not displayed]

FINDINGS: The liver is diffusely decreased in attenuation compared to the spleen consistent with fatty infiltration.  No focal mass or intrahepatic ductal dilatation is present.  The gallbladder, kidneys, adrenal glands, pancreas, and spleen are unremarkable accounting for some respiratory motion artifact.  Several prominent loops of bowel are seen without evidence for obstruction by CT criteria.  No free fluid or lymphadenopathy is present.  No free air is seen.  Osseous structures are intact.  Too small to characterize hypodensities are incidentally noted in the left kidney, likely cysts.
IMPRESSION: 1.  No CT evidence for acute intra-abdominal pathology, including the spleen.
2.  Diffusely fatty liver.

## 2005-12-29 IMAGING — CR DG RIBS W/ CHEST 3+V*L*
1 series · 1 of 1 positions shown · non-contrast
Comparison: 05/09/05.

CLINICAL DATA: Anterior rib pain.  Status post fall down stairs.  
 LEFT SIDED RIBS WITH CHEST ? 7 VIEWS:

[view not recorded]
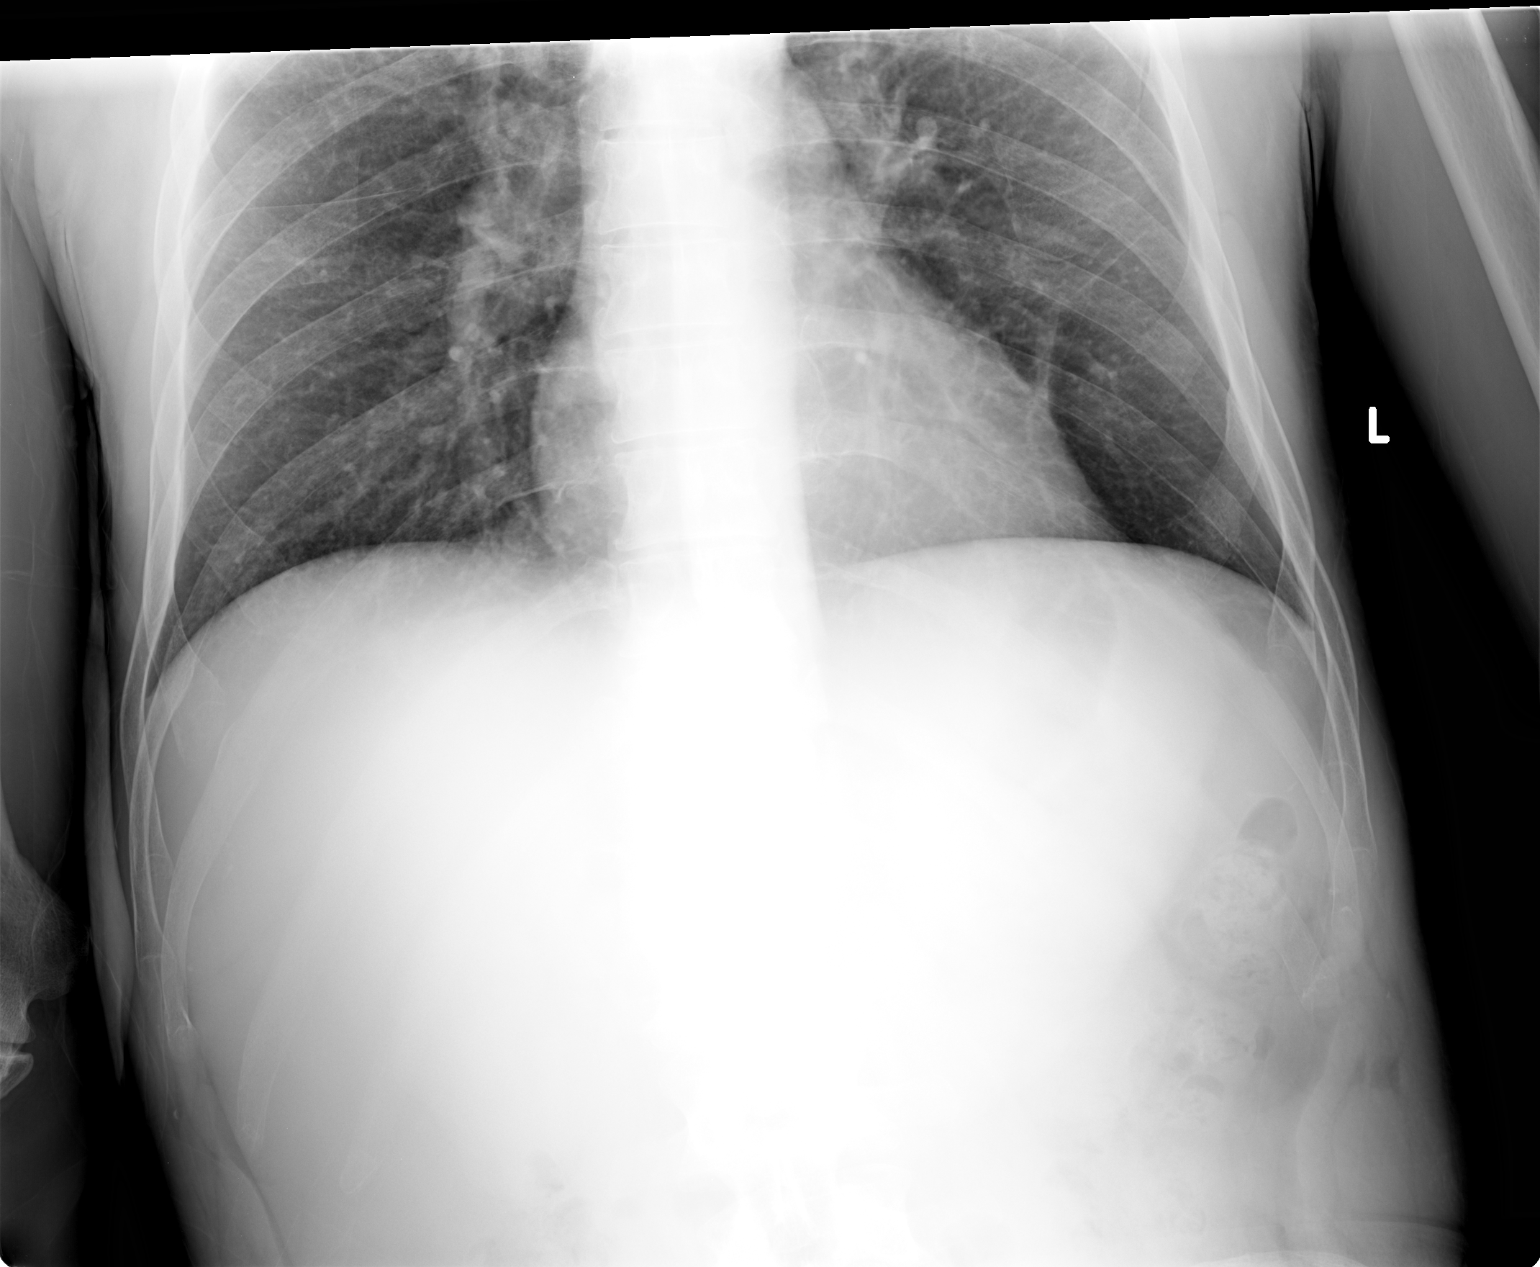

[1 of 1 positions shown; findings below may reference images not displayed]

FINDINGS: Frontal view of the chest demonstrates midline trachea and normal heart size.  Lungs hyperinflated but clear.  No pneumothorax or pleural fluid.  Irregular contour of the anterior left sixth rib is similar to that on the prior exam and likely related to nonacute trauma.  There is no evidence of acute displaced rib fracture.
IMPRESSION: Remote trauma to the left sixth rib without evidence of acute fracture or acute cardiopulmonary disease.

## 2005-12-30 IMAGING — CR DG WRIST COMPLETE 3+V*L*
4 series · 4 of 4 positions shown · non-contrast
Comparison: 06/23/2005.

CLINICAL DATA: Laceration to forearm 12 days ago.  There is now swelling and redness around the sutures.  
 LEFT WRIST - 4 VIEW:

[view not recorded (1 of 4)]
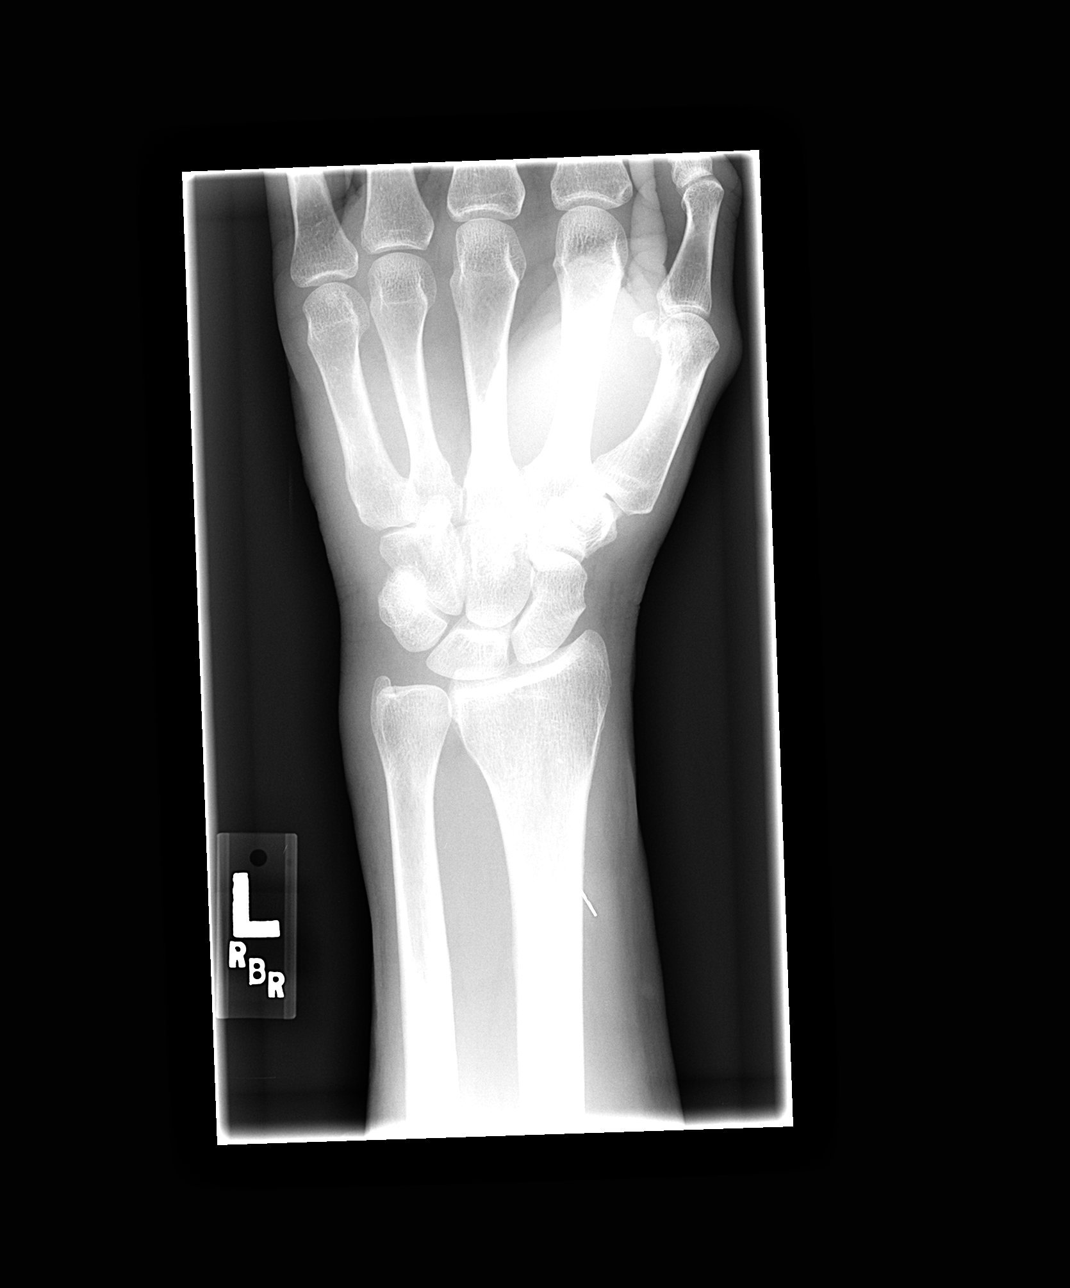

[view not recorded (2 of 4)]
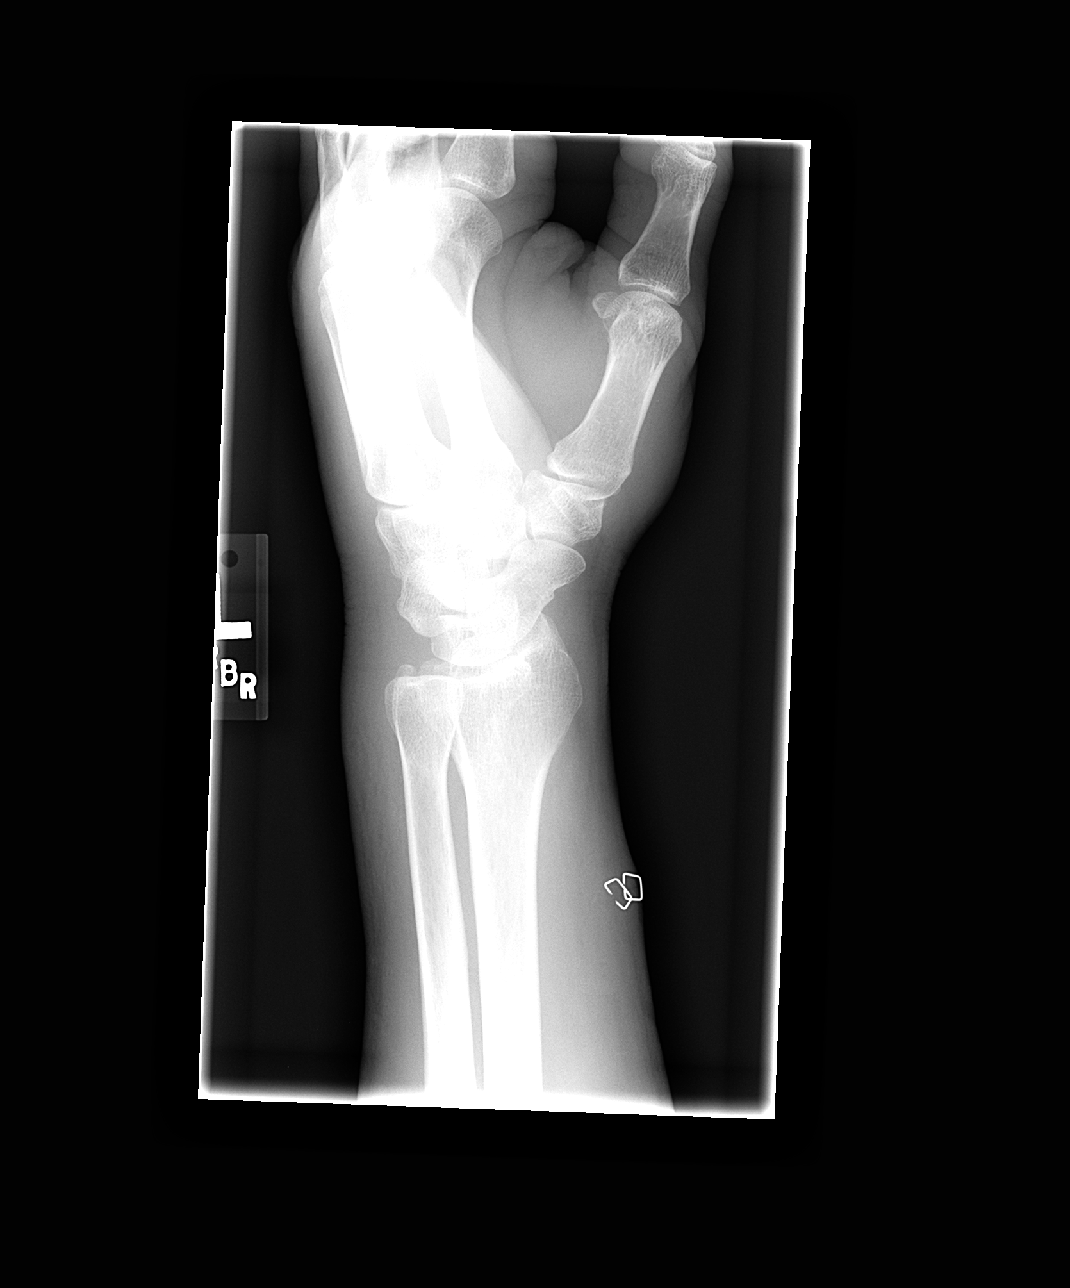

[view not recorded (3 of 4)]
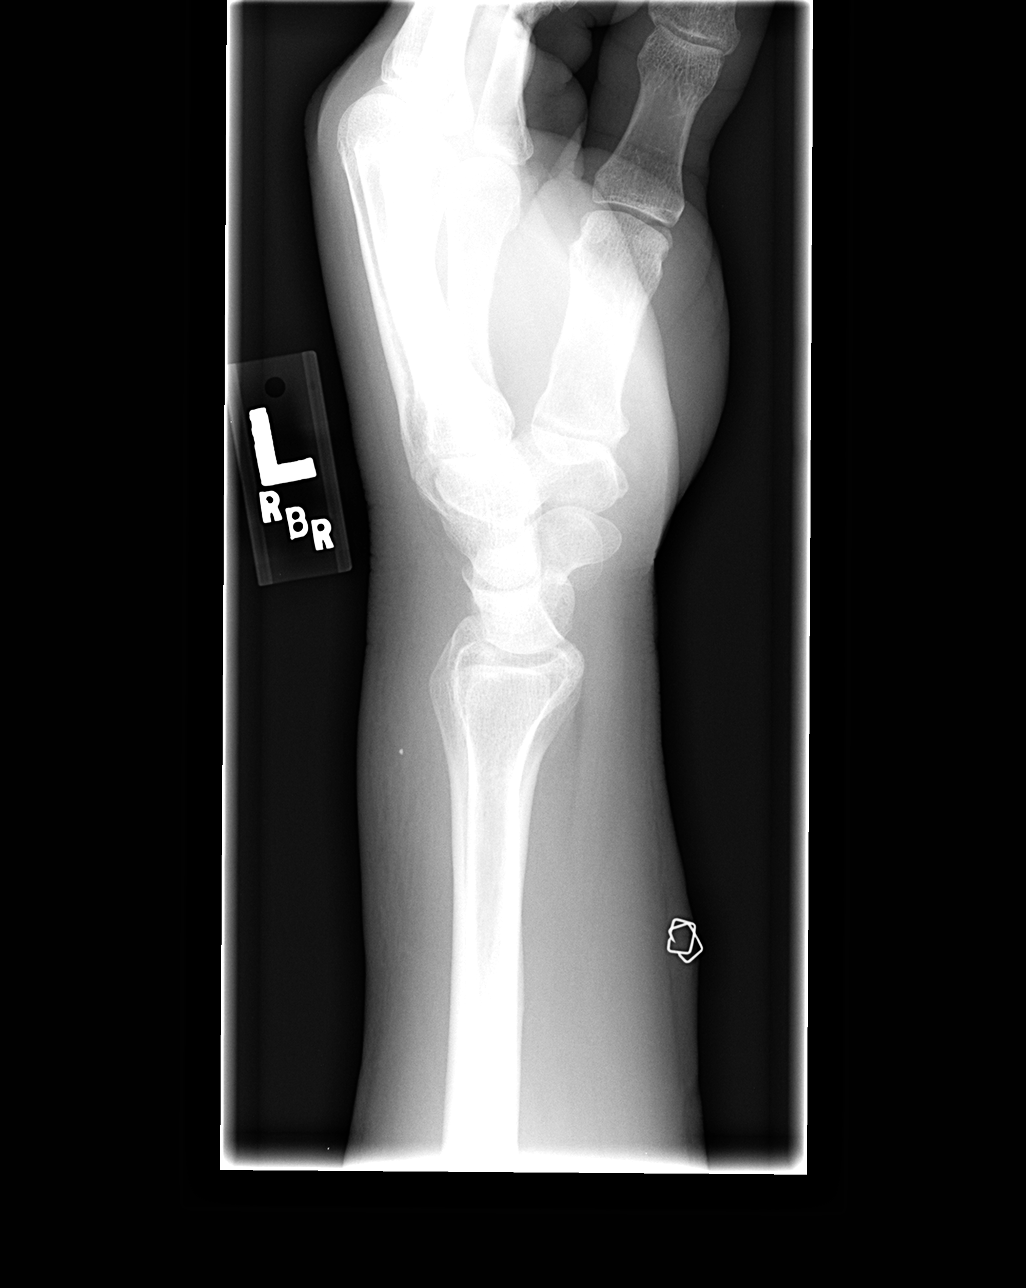

[view not recorded (4 of 4)]
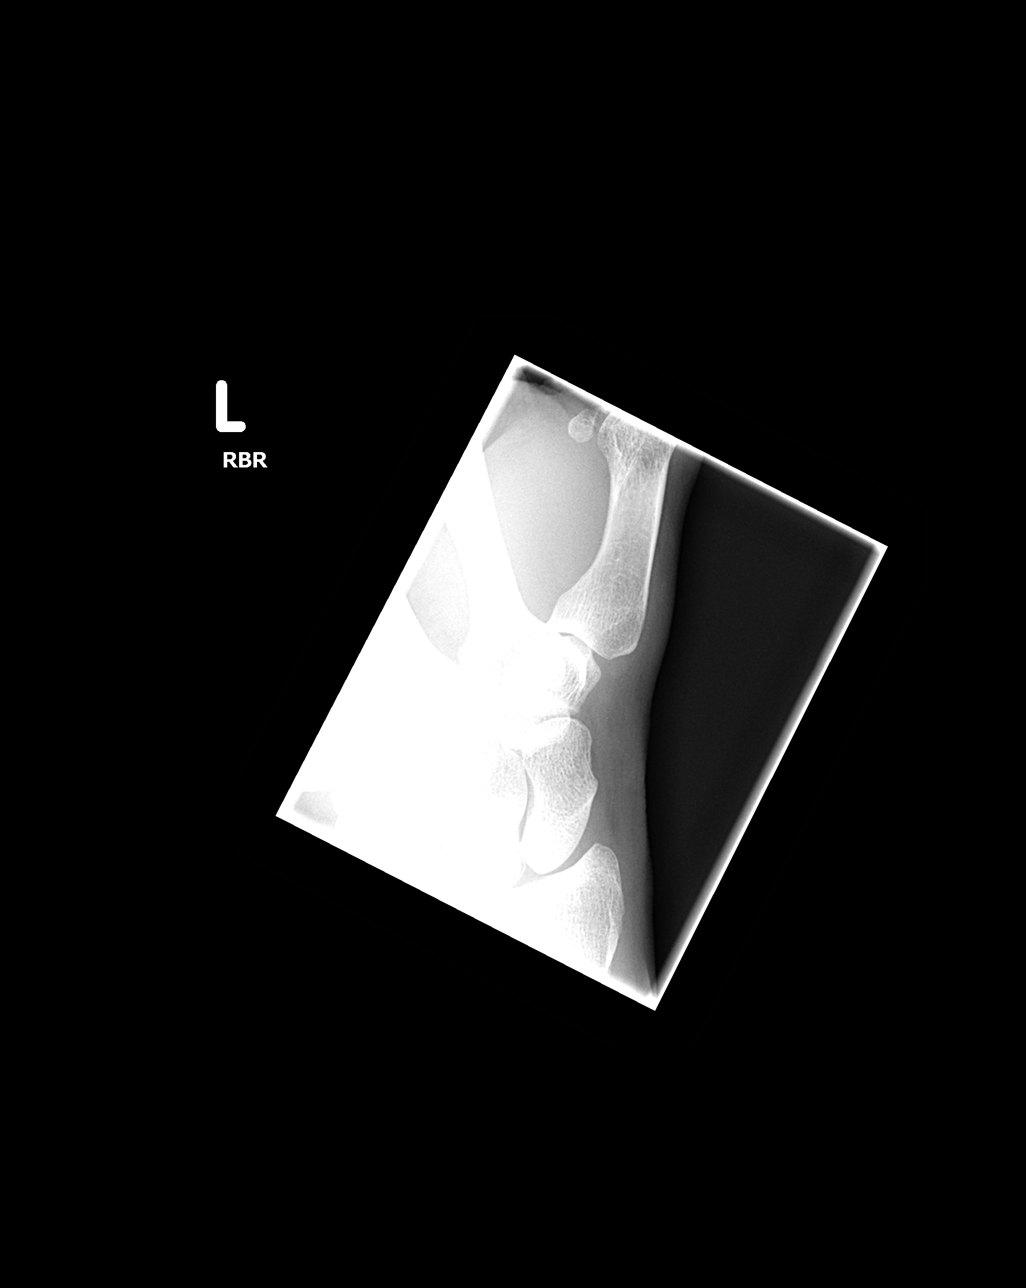

[4 of 4 positions shown; findings below may reference images not displayed]

FINDINGS: Again noted is soft tissue swelling about the wrist.  Sutures remain in place ventrally. Small radiodense foreign body unchanged. Bony structures intact.
IMPRESSION: No significant change - bony structures normal.

## 2006-11-07 ENCOUNTER — Emergency Department: Payer: Self-pay | Admitting: Unknown Physician Specialty

## 2006-11-07 ENCOUNTER — Other Ambulatory Visit: Payer: Self-pay

## 2006-11-10 ENCOUNTER — Ambulatory Visit: Payer: Self-pay | Admitting: Cardiology

## 2006-11-11 ENCOUNTER — Inpatient Hospital Stay (HOSPITAL_COMMUNITY): Admission: EM | Admit: 2006-11-11 | Discharge: 2006-11-13 | Payer: Self-pay | Admitting: Emergency Medicine

## 2006-11-12 ENCOUNTER — Encounter: Payer: Self-pay | Admitting: Cardiology

## 2006-11-20 ENCOUNTER — Inpatient Hospital Stay (HOSPITAL_COMMUNITY): Admission: EM | Admit: 2006-11-20 | Discharge: 2006-11-25 | Payer: Self-pay | Admitting: Emergency Medicine

## 2006-11-26 ENCOUNTER — Emergency Department (HOSPITAL_COMMUNITY): Admission: EM | Admit: 2006-11-26 | Discharge: 2006-11-27 | Payer: Self-pay | Admitting: Emergency Medicine

## 2007-01-28 ENCOUNTER — Inpatient Hospital Stay (HOSPITAL_COMMUNITY): Admission: EM | Admit: 2007-01-28 | Discharge: 2007-02-04 | Payer: Self-pay | Admitting: Emergency Medicine

## 2007-02-04 ENCOUNTER — Ambulatory Visit: Payer: Self-pay | Admitting: *Deleted

## 2007-02-04 ENCOUNTER — Inpatient Hospital Stay (HOSPITAL_COMMUNITY): Admission: RE | Admit: 2007-02-04 | Discharge: 2007-02-10 | Payer: Self-pay | Admitting: *Deleted

## 2007-02-14 ENCOUNTER — Emergency Department: Payer: Self-pay | Admitting: Emergency Medicine

## 2007-02-20 ENCOUNTER — Inpatient Hospital Stay (HOSPITAL_COMMUNITY): Admission: EM | Admit: 2007-02-20 | Discharge: 2007-03-05 | Payer: Self-pay | Admitting: Emergency Medicine

## 2007-03-03 ENCOUNTER — Ambulatory Visit: Payer: Self-pay | Admitting: Psychiatry

## 2007-03-08 ENCOUNTER — Emergency Department: Payer: Self-pay | Admitting: Emergency Medicine

## 2007-03-08 ENCOUNTER — Other Ambulatory Visit: Payer: Self-pay

## 2007-03-09 ENCOUNTER — Other Ambulatory Visit: Payer: Self-pay

## 2007-03-09 ENCOUNTER — Emergency Department: Payer: Self-pay | Admitting: Emergency Medicine

## 2007-04-04 ENCOUNTER — Ambulatory Visit: Payer: Self-pay | Admitting: Infectious Diseases

## 2007-04-04 ENCOUNTER — Inpatient Hospital Stay (HOSPITAL_COMMUNITY): Admission: EM | Admit: 2007-04-04 | Discharge: 2007-04-09 | Payer: Self-pay | Admitting: Emergency Medicine

## 2007-04-11 ENCOUNTER — Emergency Department (HOSPITAL_COMMUNITY): Admission: EM | Admit: 2007-04-11 | Discharge: 2007-04-11 | Payer: Self-pay | Admitting: Emergency Medicine

## 2007-05-15 IMAGING — CR DG CHEST 1V PORT
1 series · 1 of 1 positions shown · non-contrast
Comparison: none

REASON FOR EXAM: abd pain
COMMENTS:

[view not recorded]
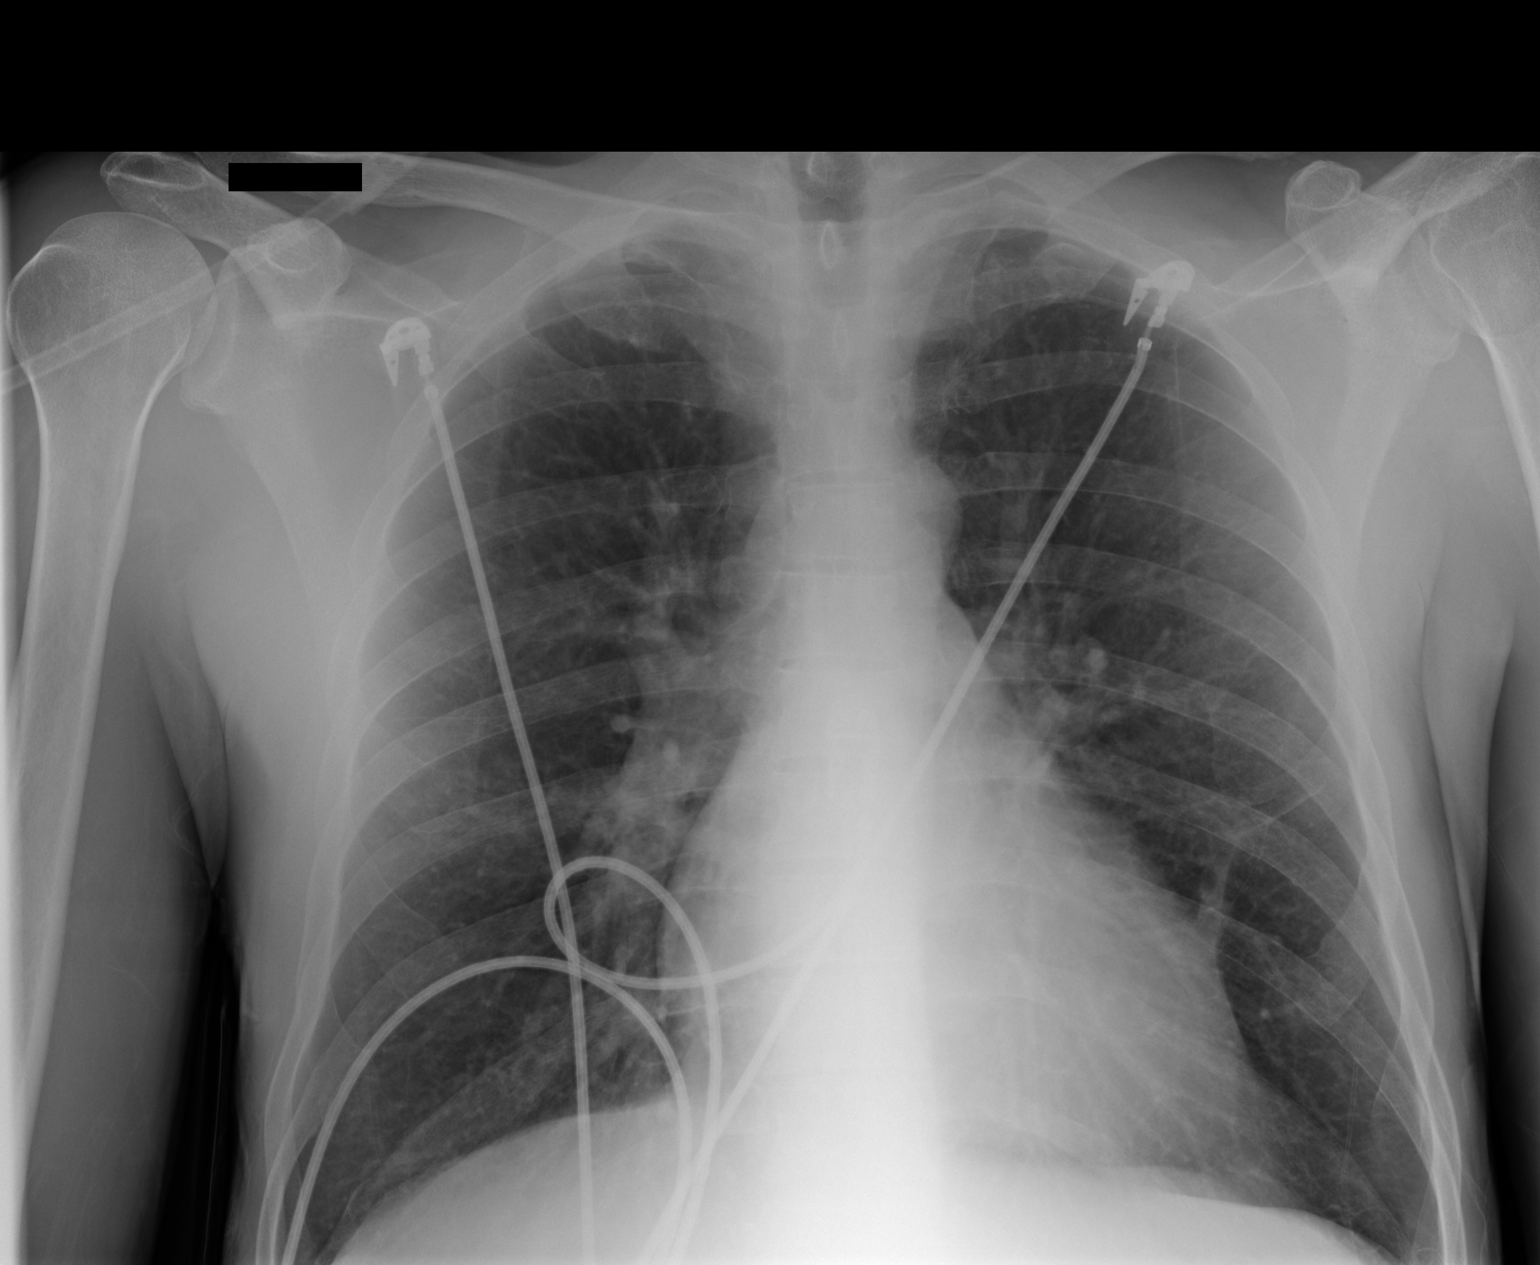

[1 of 1 positions shown; findings below may reference images not displayed]

PROCEDURE:     DXR - DXR PORTABLE CHEST SINGLE VIEW  - November 07, 2006  [DATE]

RESULT:     Comparison is made to study 02 December, 2004.

The lungs are mildly hyperinflated. There is scarring adjacent to the LEFT
cardiac apex. The heart is normal in size. The pulmonary vascularity is not
engorged. The costophrenic angles are excluded from the exam bilaterally.
IMPRESSION: I do not see evidence of acute cardiopulmonary abnormality. If small pleural
effusions are suspected clinically, a repeat chest film at no charge to the
patient is available upon request.

## 2007-05-27 IMAGING — CT CT HEAD W/O CM
1 series · 16 of 30 positions shown, 20 images · non-contrast
Comparison: none

CLINICAL DATA: Withdrawal symptoms

[Series 3: headseq 4.8 h45s · axial · 0.43mm/px · z∈[-133,+24]mm · 16 of 36 slices shown, 20 images]
[im 2/36  brain]
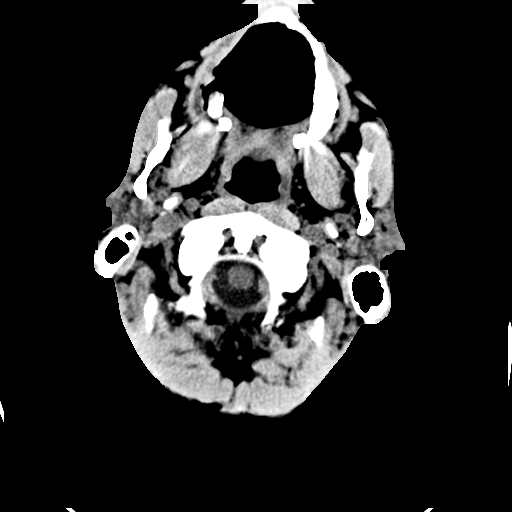
[im 2/36  bone]
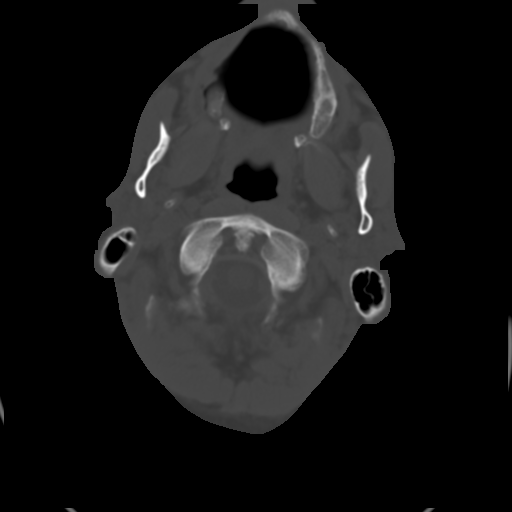
[im 4/36  brain]
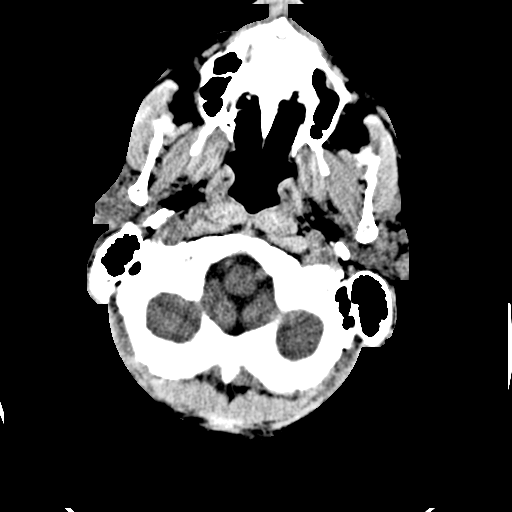
[im 7/36  brain]
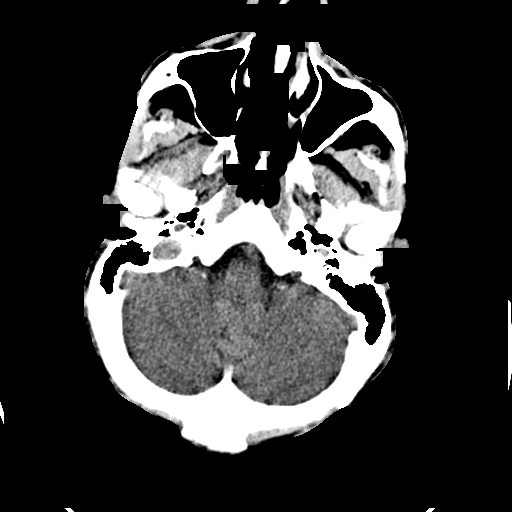
[im 9/36  brain]
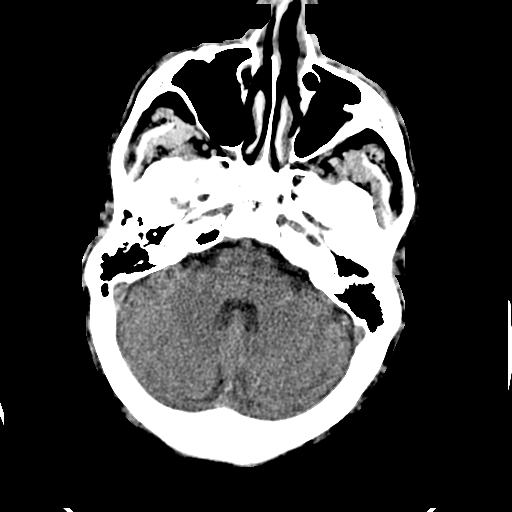
[im 10/36  brain]
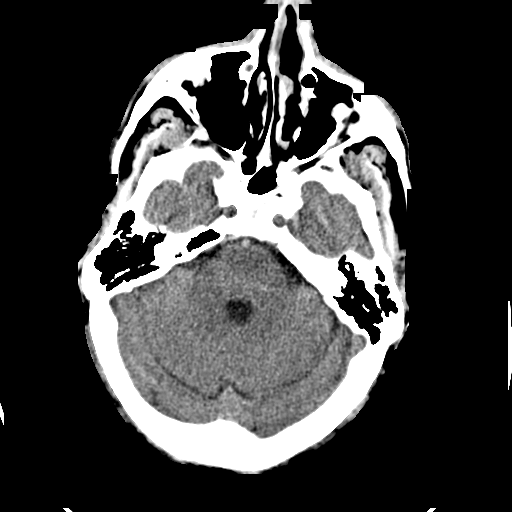
[im 10/36  bone]
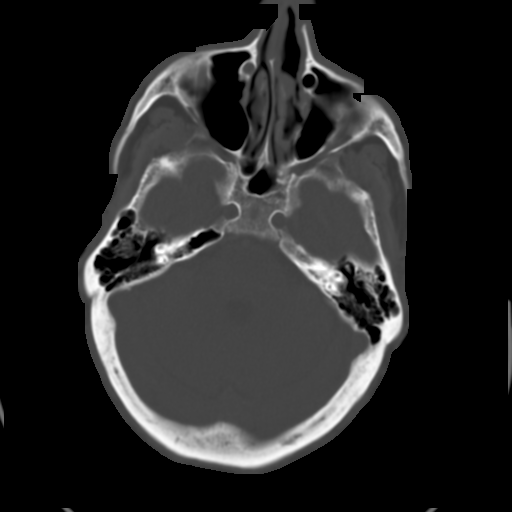
[im 13/36  brain]
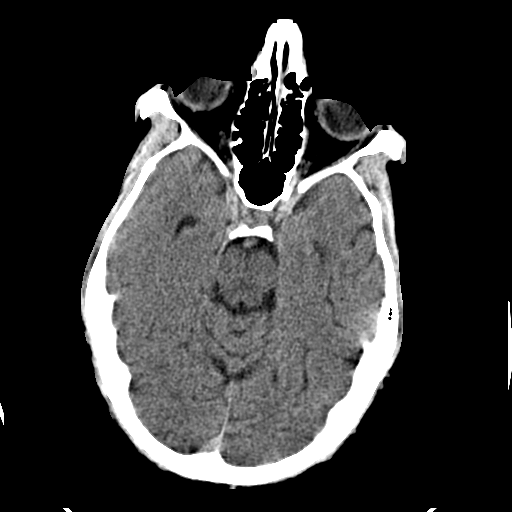
[im 15/36  brain]
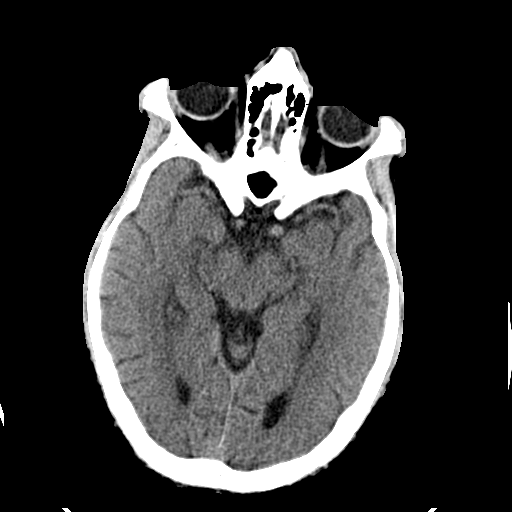
[im 17/36  brain]
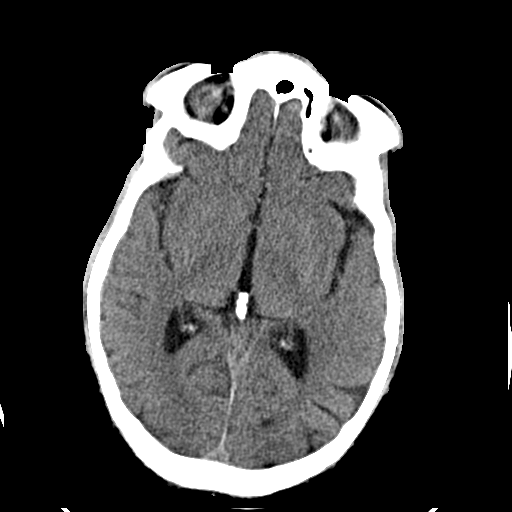
[im 19/36  brain]
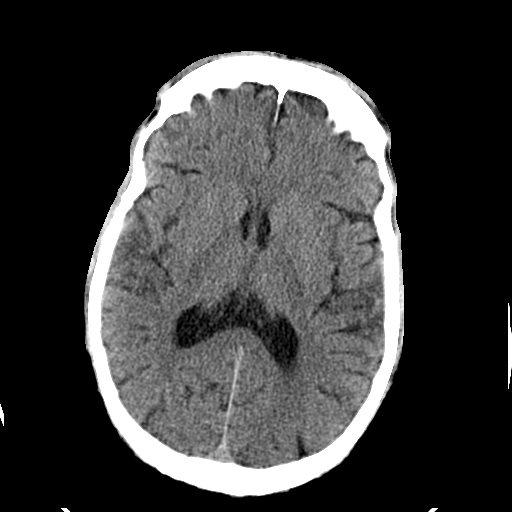
[im 19/36  bone]
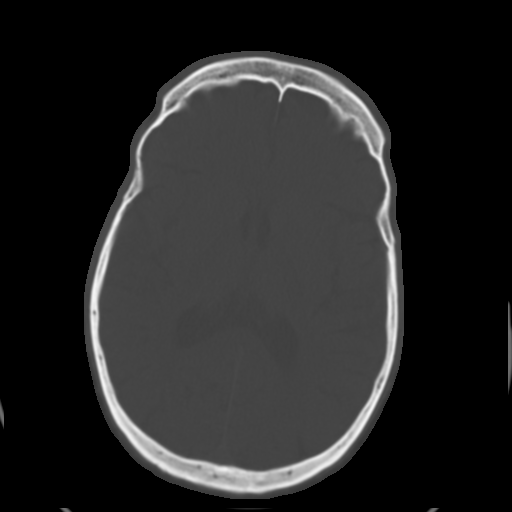
[im 21/36  brain]
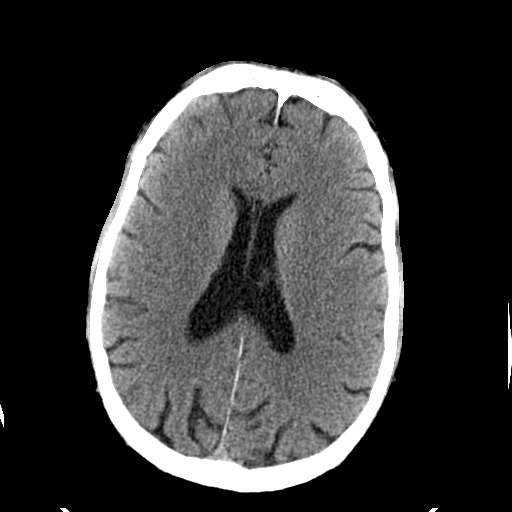
[im 23/36  brain]
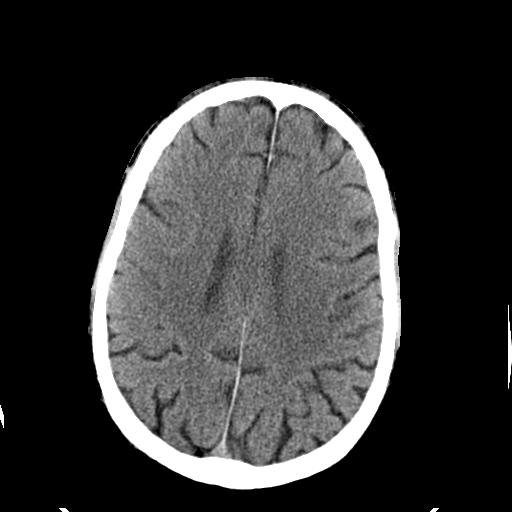
[im 26/36  brain]
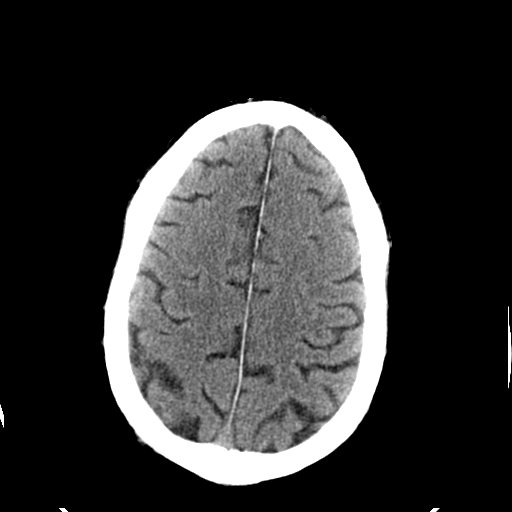
[im 27/36  brain]
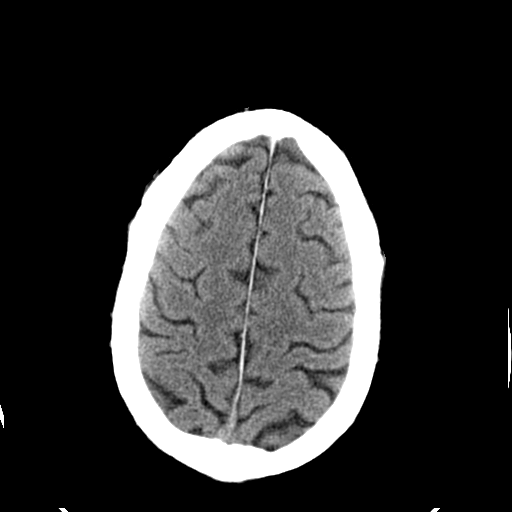
[im 27/36  bone]
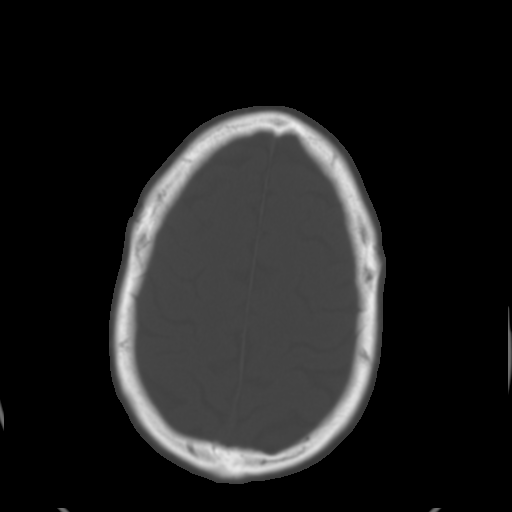
[im 29/36  brain]
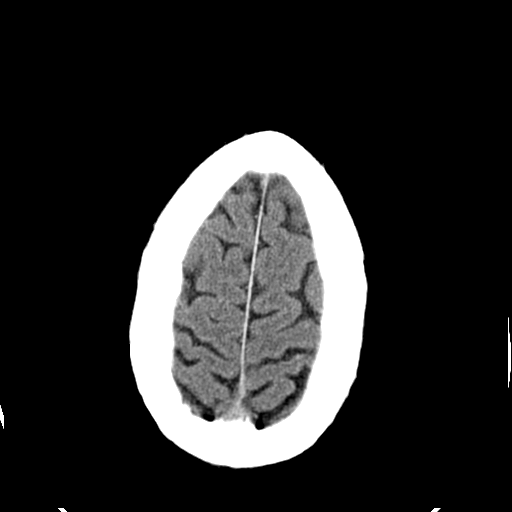
[im 32/36  brain]
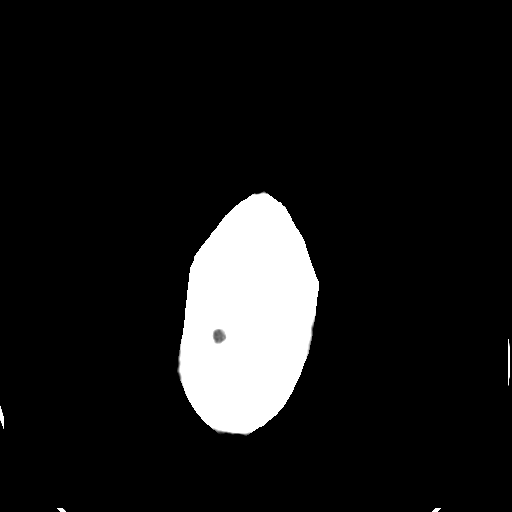
[im 34/36  brain]
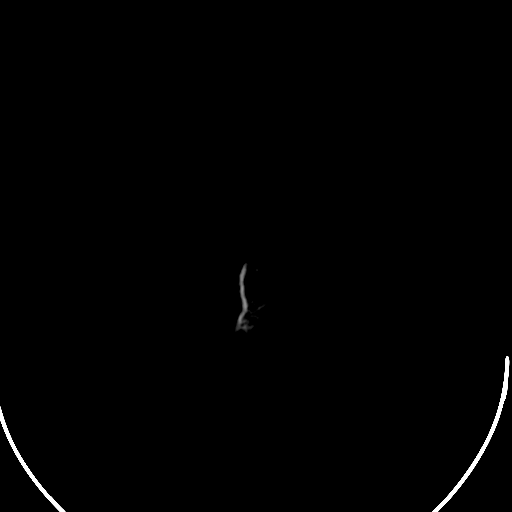

[16 of 30 positions shown; findings below may reference images not displayed]

CT head without contrast:

Comparison 05/20/2004.  There is no evidence of acute intracranial hemorrhage,
brain edema, mass,  mass effect, or midline shift. Acute infarct may be
inapparent on noncontrast CT.  No other intra-axial abnormalities are seen, and
the ventricles and sulci are within normal limits in size and symmetry.   No
abnormal extra-axial fluid collections or masses are identified.  No significant
calvarial abnormality.
IMPRESSION: 1. Negative non-contrast head CT.

## 2007-05-27 IMAGING — CR DG CHEST 1V
1 series · 1 of 1 positions shown · non-contrast
Comparison: none

CLINICAL DATA: Withdrawal symptoms

Chest one view:
Comparison 06/23/2005. The heart size and mediastinal contours are within normal
limits.  Both lungs are clear.  The visualized skeletal structures are
unremarkable.

[view not recorded]
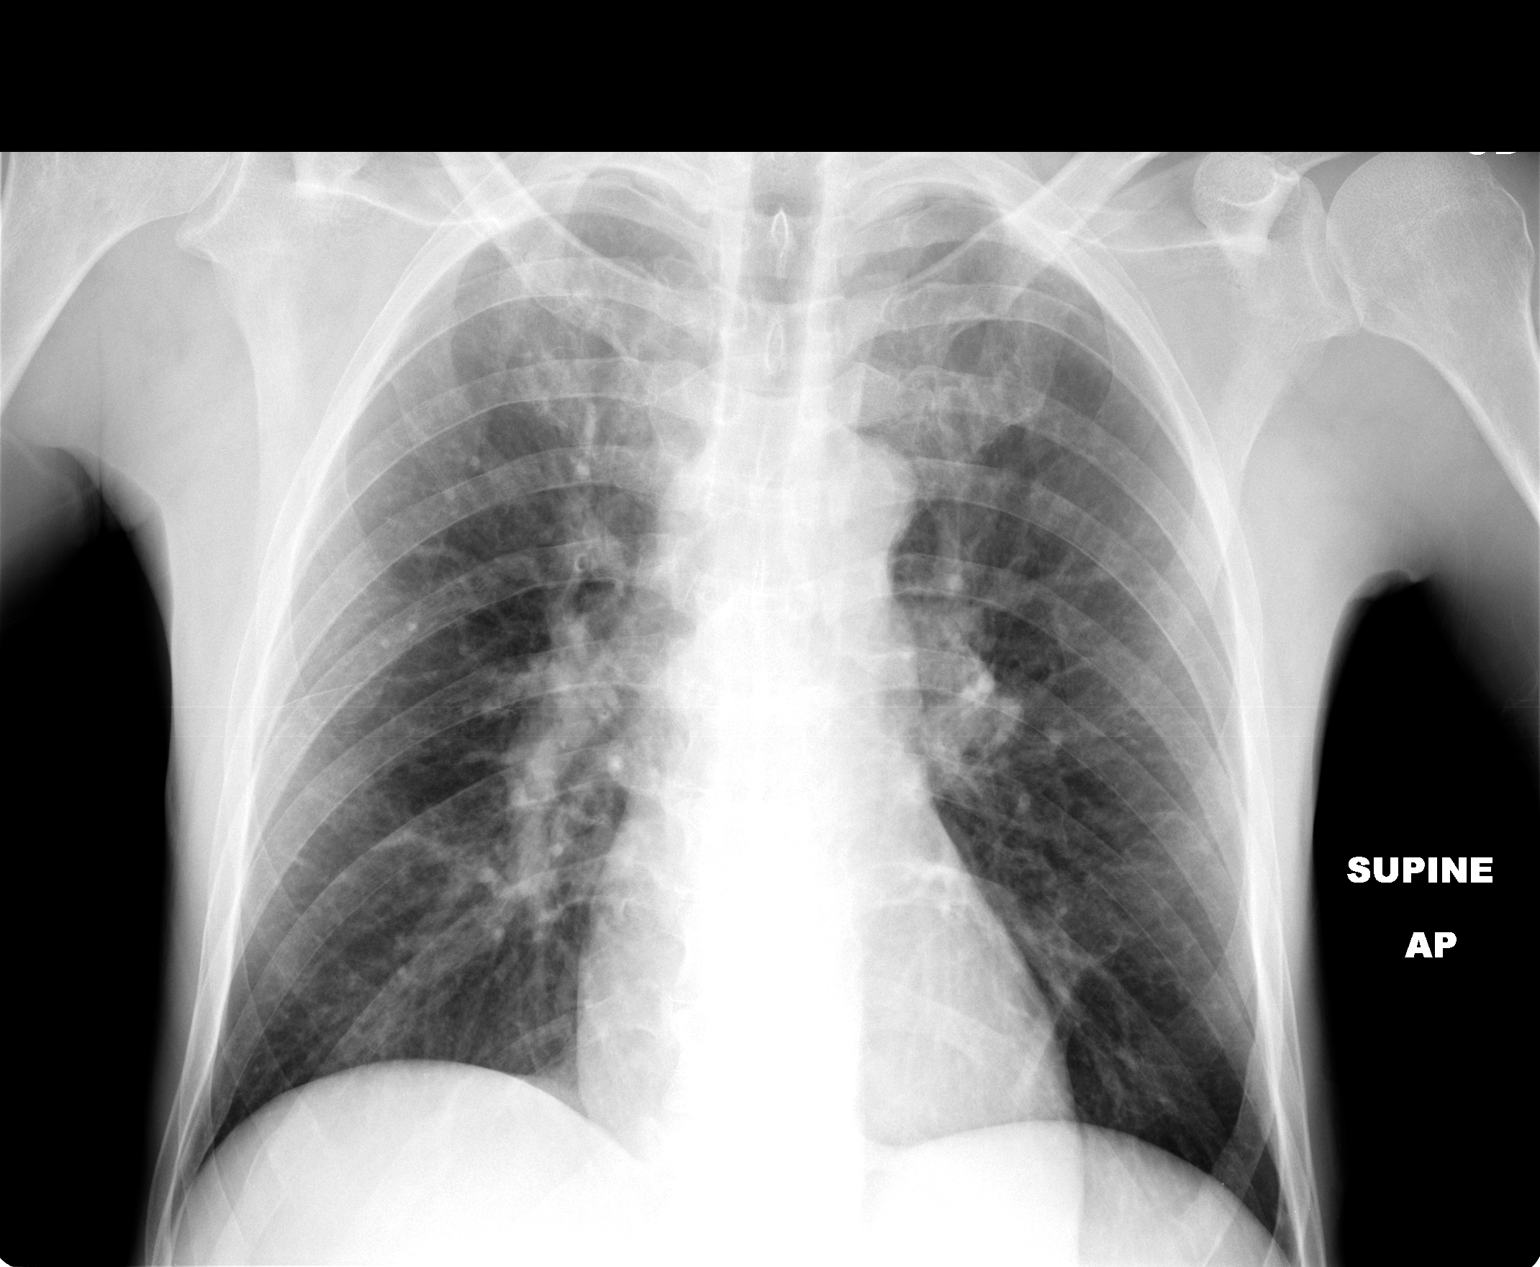

[1 of 1 positions shown; findings below may reference images not displayed]

IMPRESSION: 1. No active cardiopulmonary disease.

## 2007-06-03 IMAGING — CT CT ABDOMEN W/ CM
5 of 13 series · 11 of 46 positions shown, 17 images · IV contrast (100 ML OMNI 300)
Comparison: none

CLINICAL DATA: Fall from scaffolding 15 to 30 feet.       
 HEAD CT WITHOUT CONTRAST:
TECHNIQUE: Contiguous axial images were obtained from the base of the skull through the vertex according to standard protocol without contrast.
TECHNIQUE: Multidetector CT imaging of the cervical spine was performed.  Multiplanar CT image reconstructions were also generated.
TECHNIQUE: Multidetector CT imaging of the chest was performed following the standard protocol during bolus administration of intravenous contrast.
 Contrast:  100 cc Omnipaque 300
TECHNIQUE: Multidetector CT imaging of the abdomen was performed following the standard protocol during bolus administration of intravenous contrast.
TECHNIQUE: Multidetector CT imaging of the pelvis was performed following the standard protocol during bolus administration of intravenous contrast.

[Series 600: reformatted · sagittal · 0.50mm/px · 3 of 48 slices shown, 4 images (1 of 5)]
[im 16/48  soft-tissue]
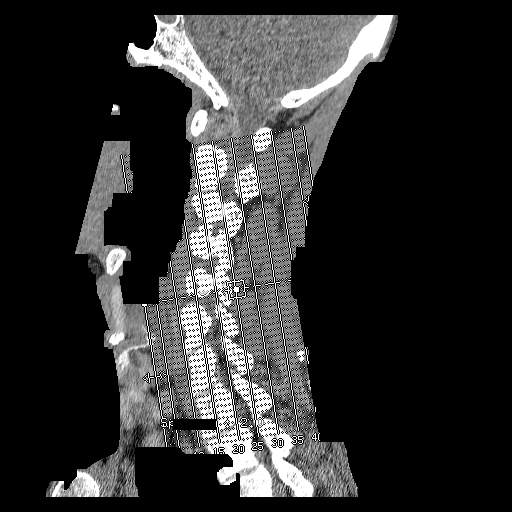
[im 21/48  soft-tissue]
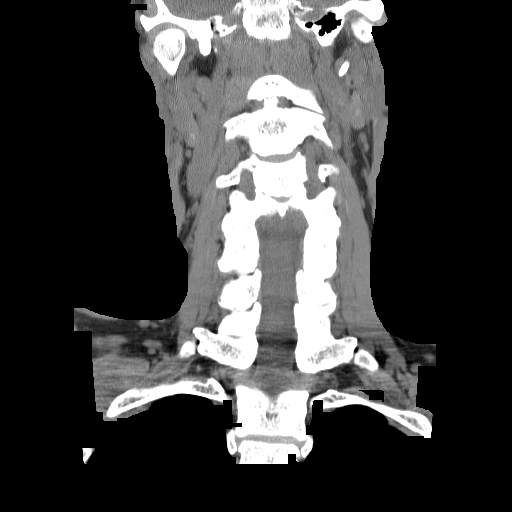
[im 21/48  bone]
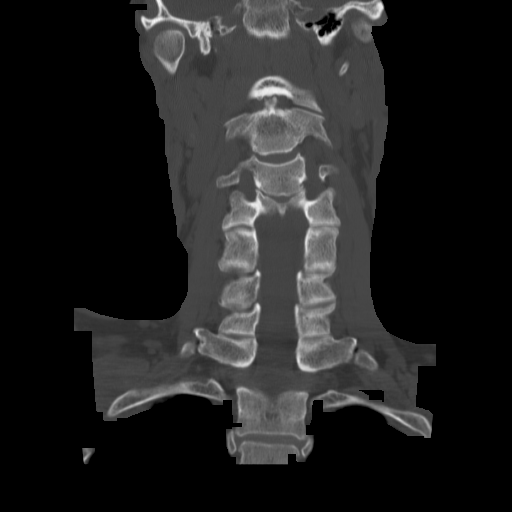
[im 27/48  soft-tissue]
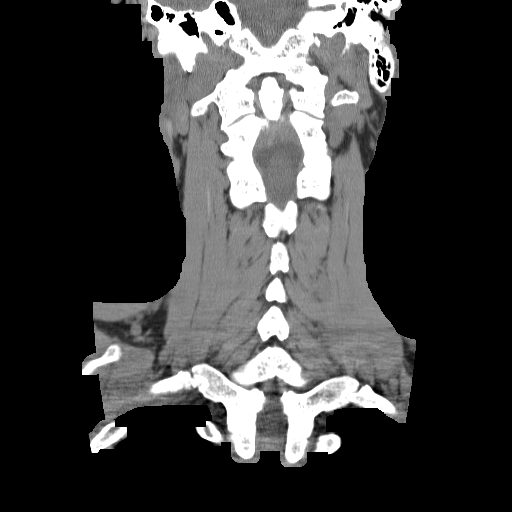

[Series 601: reformatted · sagittal · 0.50mm/px · 1 of 66 slices shown, 2 images (2 of 5)]
[im 22/66  soft-tissue]
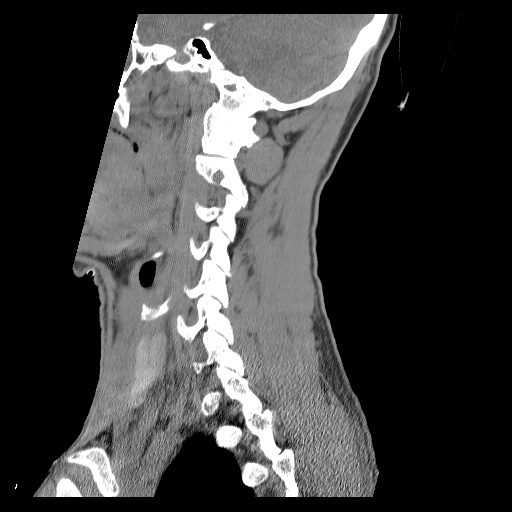
[im 22/66  bone]
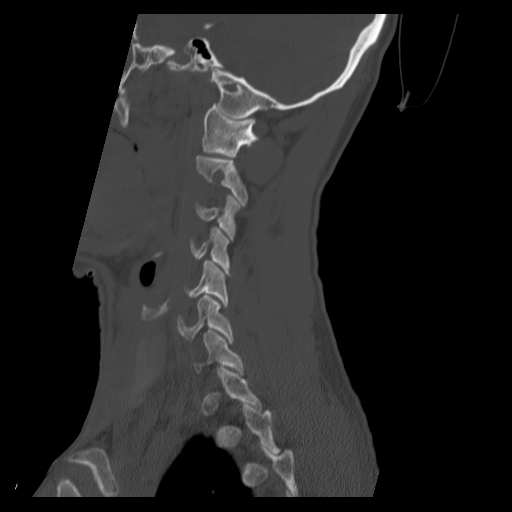

[Series 900: reformatted · sagittal · 0.77mm/px · 2 of 161 slices shown (3 of 5)]
[im 54/161  soft-tissue]
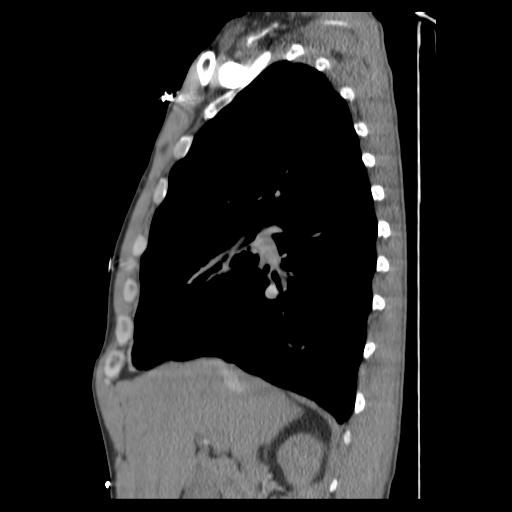
[im 107/161  soft-tissue]
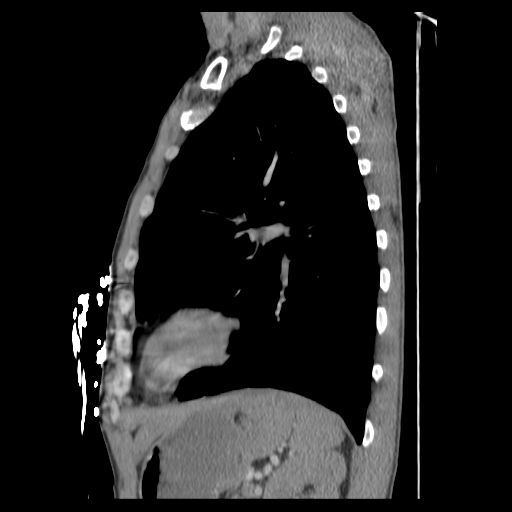

[Series 901: reformatted · coronal · 0.77mm/px · 2 of 137 slices shown (4 of 5)]
[im 46/137  soft-tissue]
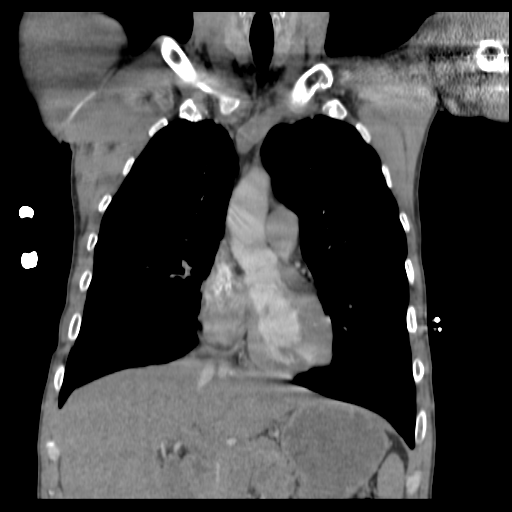
[im 91/137  soft-tissue]
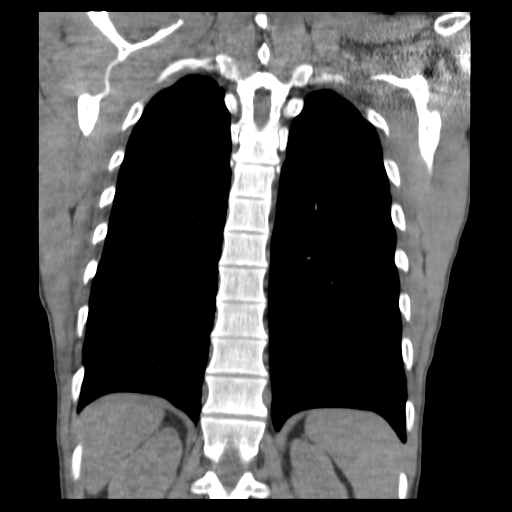

[Series 902: reformatted · sagittal · 0.91mm/px · 3 of 170 slices shown, 7 images (5 of 5)]
[im 43/170  soft-tissue]
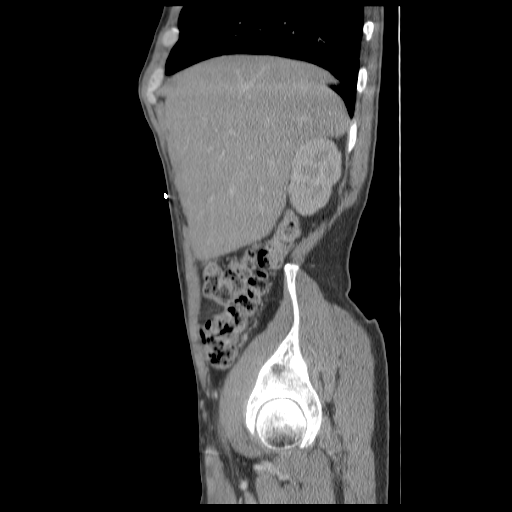
[im 43/170  lung]
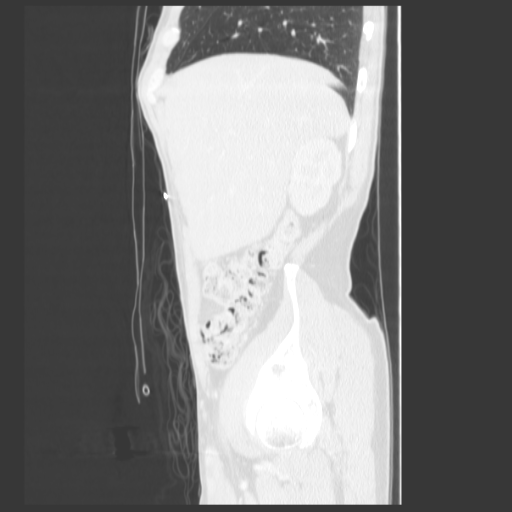
[im 43/170  bone]
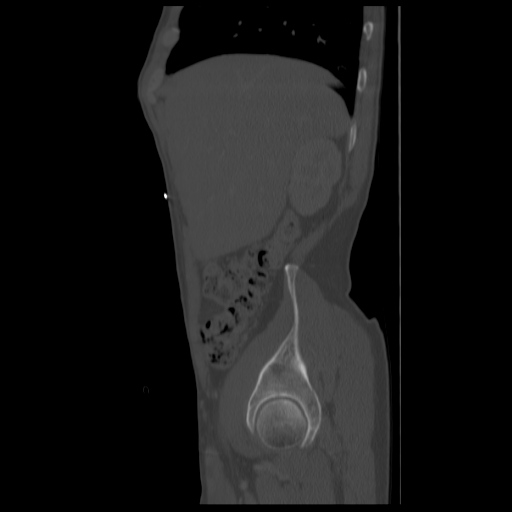
[im 85/170  soft-tissue]
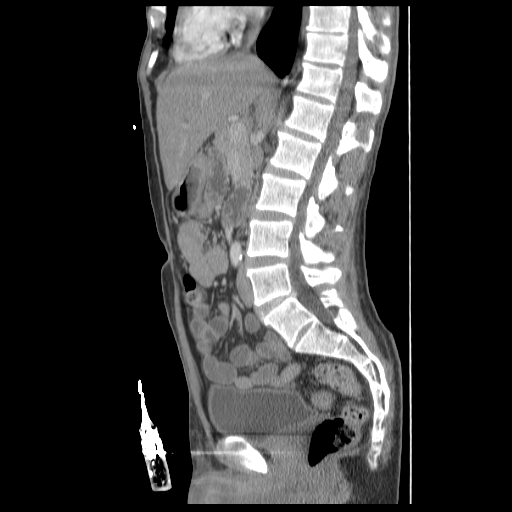
[im 85/170  lung]
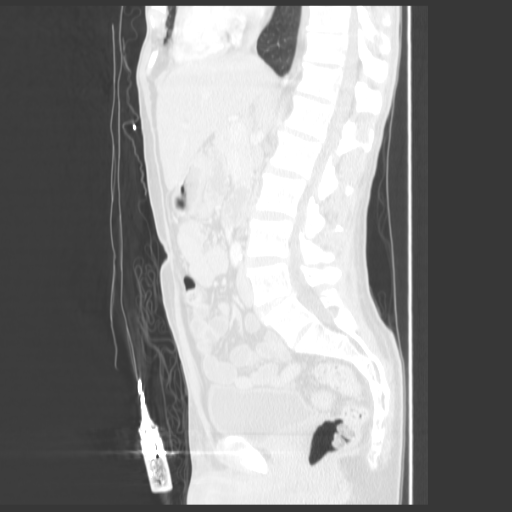
[im 127/170  soft-tissue]
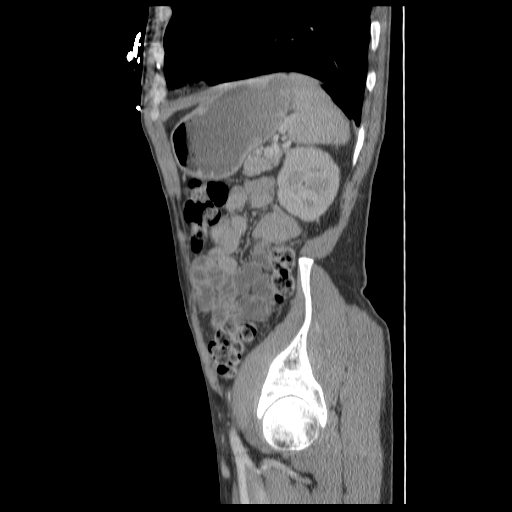
[im 127/170  lung]
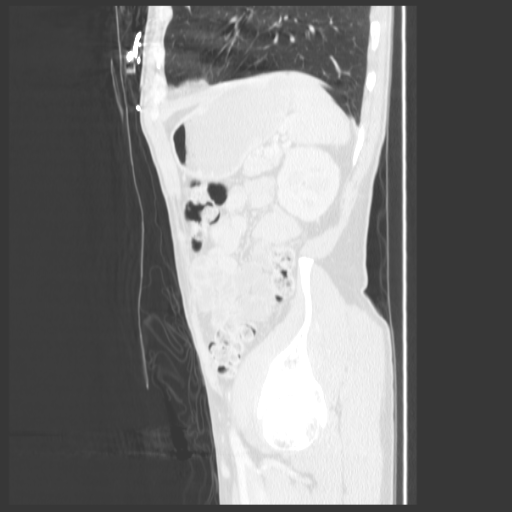

[11 of 46 positions shown; findings below may reference images not displayed]

FINDINGS: The brain parenchyma, ventricular system and extraaxial spaces are within normal limits. There is no mass effect, midline shift or acute intracranial hemorrhage.  Mastoid air cells and visualized paranasal sinuses are clear.
IMPRESSION: No acute intracranial pathology. 
 CERVICAL SPINE CT WITHOUT CONTRAST:
FINDINGS: No acute fractures or dislocations are seen.  There is anatomic alignment of the vertebral bodies.   Mild degenerative changes are scattered throughout the cervical spine.  Soft tissues are within normal limits.
IMPRESSION: No evidence of bony injury in the cervical spine.  
 CHEST CT WITH CONTRAST:
FINDINGS: There is no evidence of mediastinal hemorrhage or abnormal adenopathy.  The aorta is within normal limits.  Negative pericardial effusion.  
 No pneumothoraces or effusions are seen. 
 Linear atelectasis is present at the right base, in the right middle lobe and right lower lobe.  Minimal atelectasis is also noted in the lingula and left lower lobe.  No acute bony deformity is seen.
IMPRESSION: No acute intrathoracic injury. 
 ABDOMEN CT WITH CONTRAST:
FINDINGS: The liver, gallbladder, spleen, kidneys, adrenal glands, and pancreas are within normal limits.  A retroaortic left renal vein is present.  Negative free fluid or abnormal adenopathy.
IMPRESSION: No acute intraabdominal injury. 
 PELVIS CT WITH CONTRAST:
FINDINGS: The bladder and prostate are within normal limits.  Negative free fluid or abnormal adenopathy.   No acute bony injury is present.
IMPRESSION: No acute intrapelvic injury.

## 2007-08-05 IMAGING — CT CT PELVIS W/O CM
2 of 8 series · 14 of 46 positions shown, 19 images · IV contrast (agent unspecified)
Comparison: 11/26/06.

CLINICAL DATA: Abdominal pain.  Altered level of consciousness. 
ABDOMEN CT WITHOUT CONTRAST:
TECHNIQUE: Multidetector CT imaging of the abdomen was performed following the standard protocol without IV contrast.
TECHNIQUE: Multidetector CT imaging of the pelvis was performed following the standard protocol without IV contrast.

[Series 2: abd_pel 5.0 b40s · axial · 0.74mm/px · z∈[-486,-81]mm · 11 of 93 slices shown, 16 images]
[im 6/93  soft-tissue]
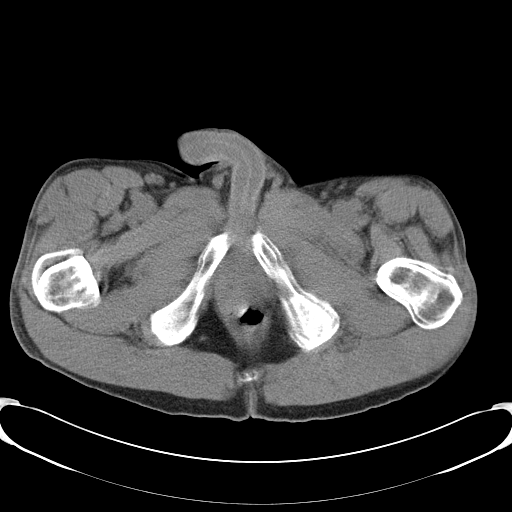
[im 6/93  bone]
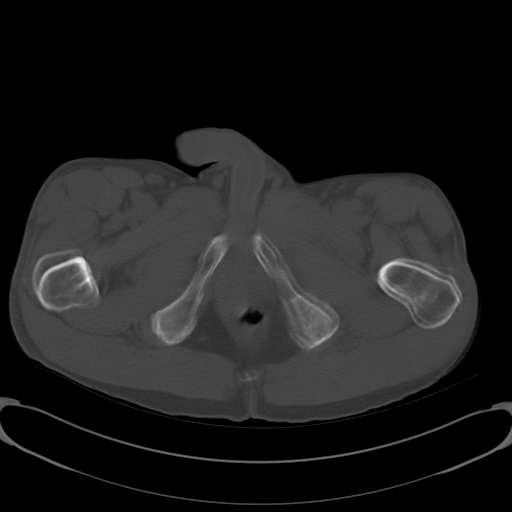
[im 18/93  soft-tissue]
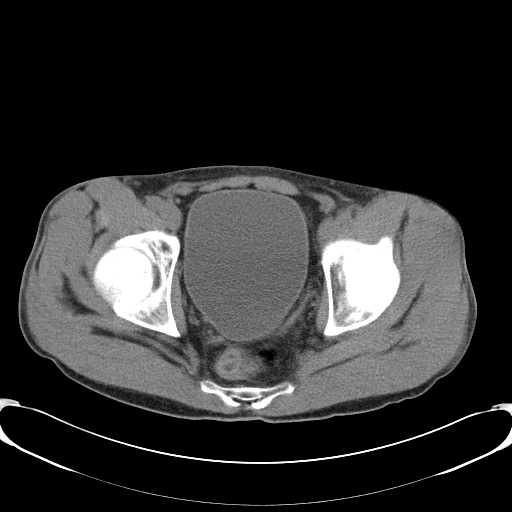
[im 24/93  soft-tissue]
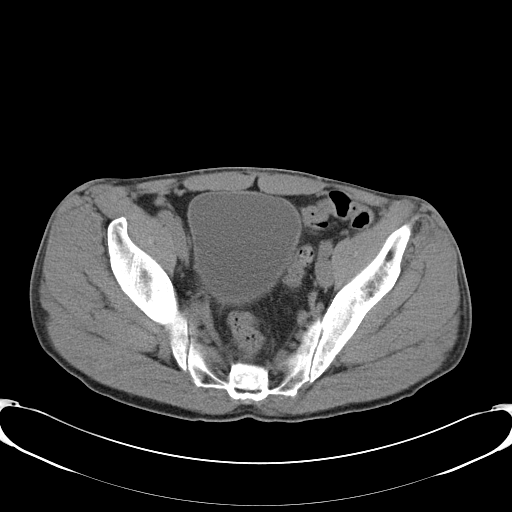
[im 35/93  soft-tissue]
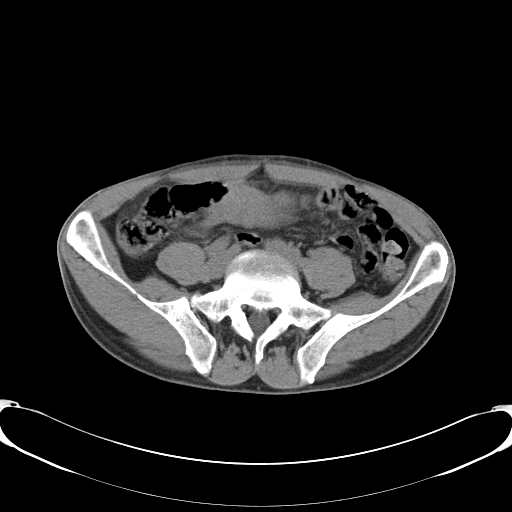
[im 41/93  soft-tissue]
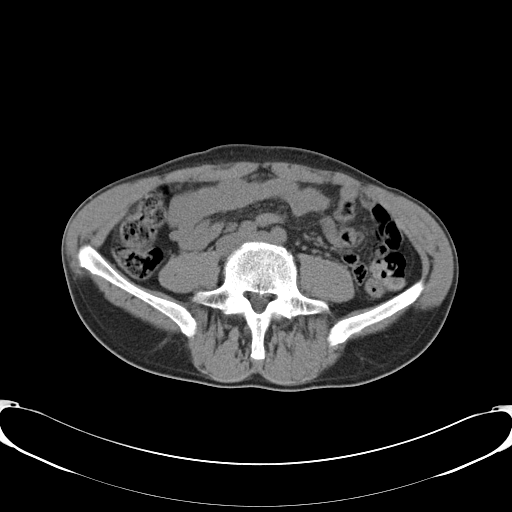
[im 52/93  soft-tissue]
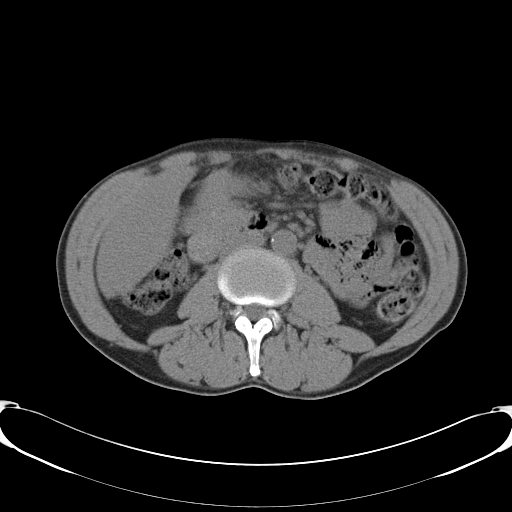
[im 58/93  soft-tissue]
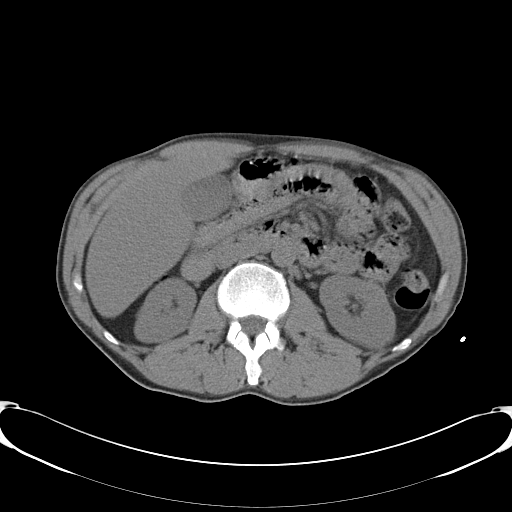
[im 70/93  soft-tissue]
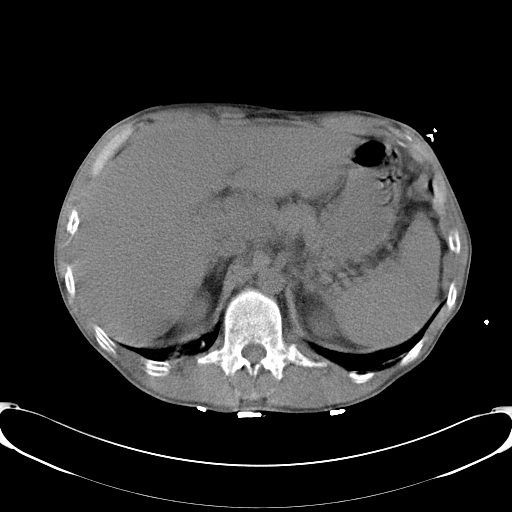
[im 70/93  lung]
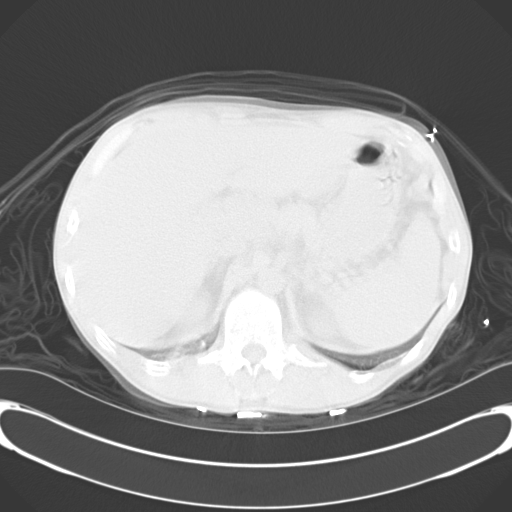
[im 75/93  soft-tissue]
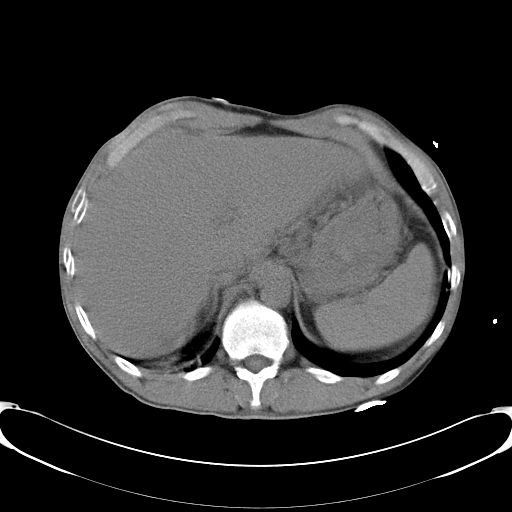
[im 75/93  lung]
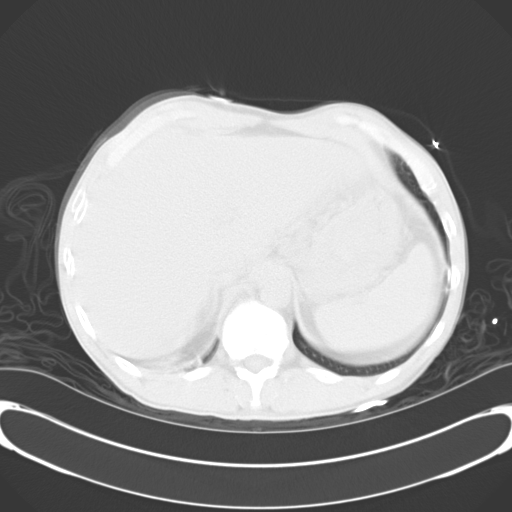
[im 75/93  bone]
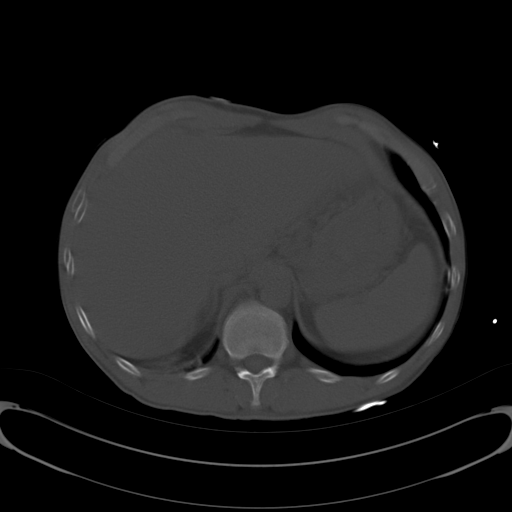
[im 81/93  lung]
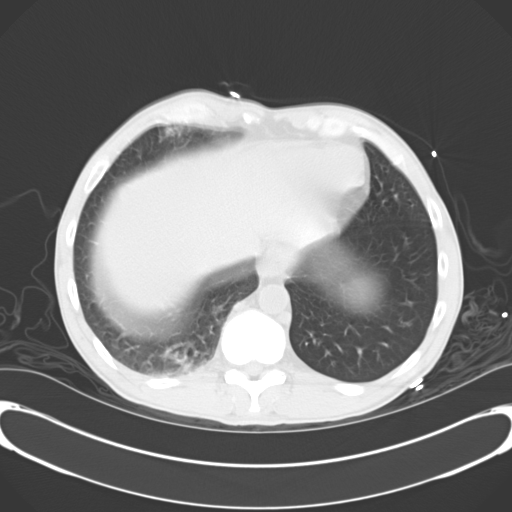
[im 87/93  soft-tissue]
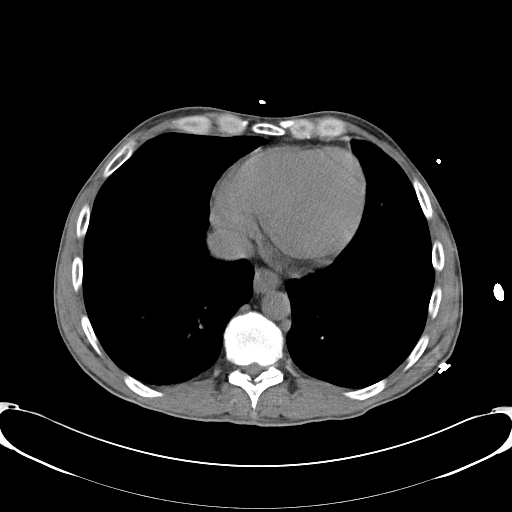
[im 87/93  lung]
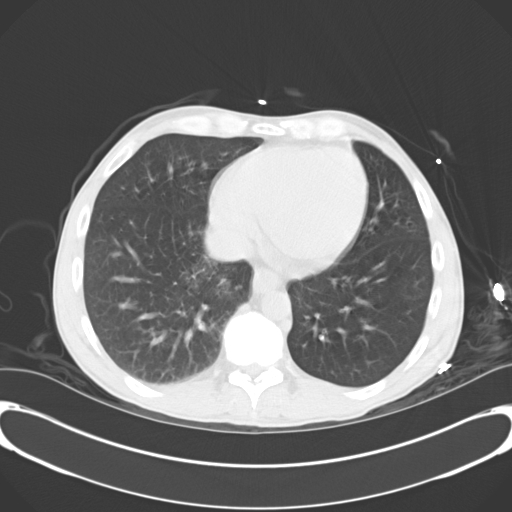

[Series 604: repeats--coronal images · coronal · 0.74mm/px · 3 of 77 slices shown]
[im 16/77  soft-tissue]
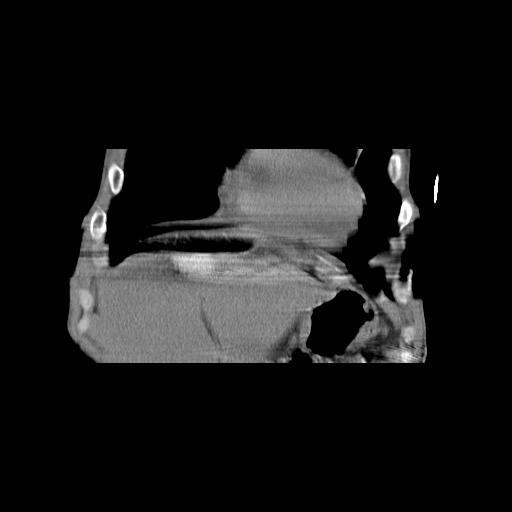
[im 31/77  soft-tissue]
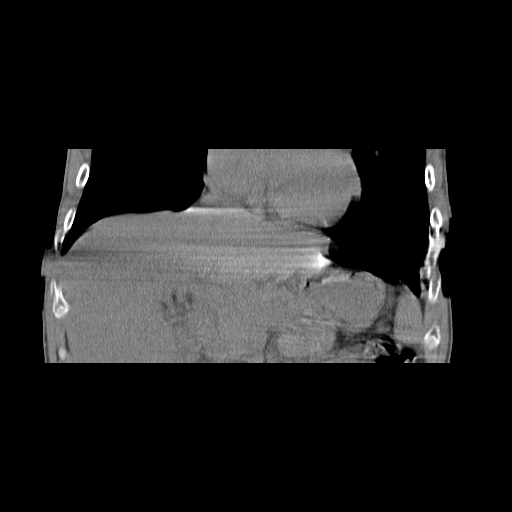
[im 46/77  soft-tissue]
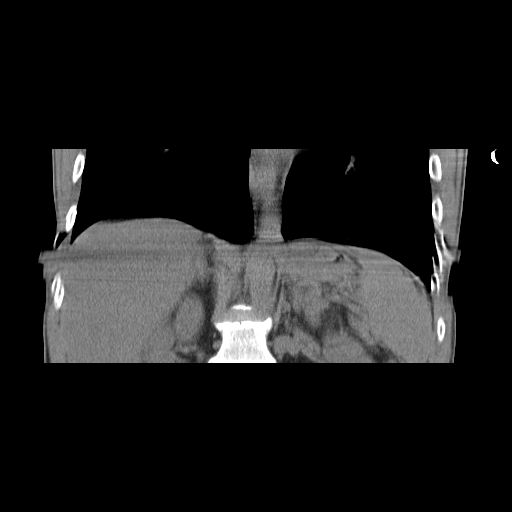

[14 of 46 positions shown; findings below may reference images not displayed]

FINDINGS: Multiple attempts were made to obtain IV contrast but were unsuccessful.  The study is somewhat limited by patient motion.  There is a small focus of airspace disease in the right lung base most consistent with atelectasis.  No pleural or pericardial effusion.  The uninfused appearance of the liver demonstrates fatty change but no focal lesion.  The gallbladder, spleen and pancreas all have a normal uninfused appearance.  Stomach and small bowel have a normal CT appearance.  No abdominal lymphadenopathy or fluid collection. No focal bony abnormality.
IMPRESSION: 1.  Small focus of airspace disease in the right lung base is likely atelectasis although early pneumonia or aspiration cannot be totally excluded.  
2.  Fatty liver. 
PELVIS CT WITHOUT CONTRAST:
FINDINGS: Colon and appendix are unremarkable.  No pelvic fluid collection or lymphadenopathy.  No focal bony abnormality.
IMPRESSION: No acute finding in the pelvis.

## 2007-08-06 IMAGING — US US RENAL
1 series · 14 of 25 positions shown · non-contrast
Comparison: none

CLINICAL DATA: Nephrosis. 
 RENAL/URINARY TRACT ULTRASOUND:
TECHNIQUE: Complete ultrasound examination of the urinary tract was performed including evaluation of the kidneys, renal collecting systems, and urinary bladder.

[Series 1: renal · 0.33mm/px · 14 of 29 slices shown]
[im 1/29]
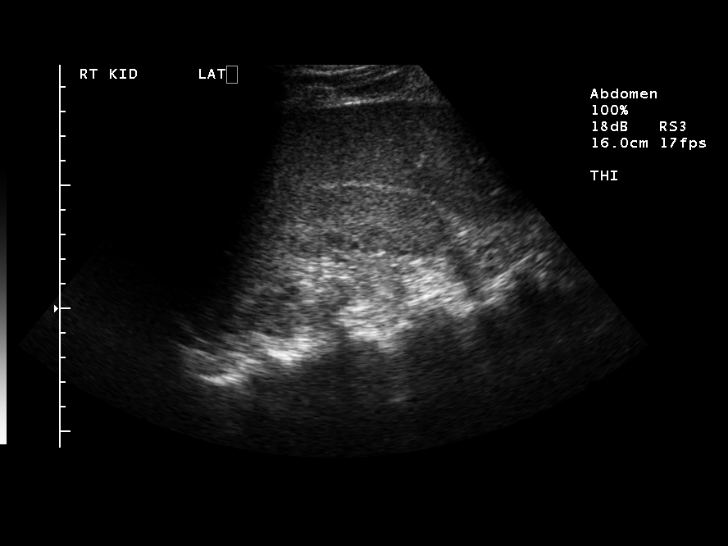
[im 3/29]
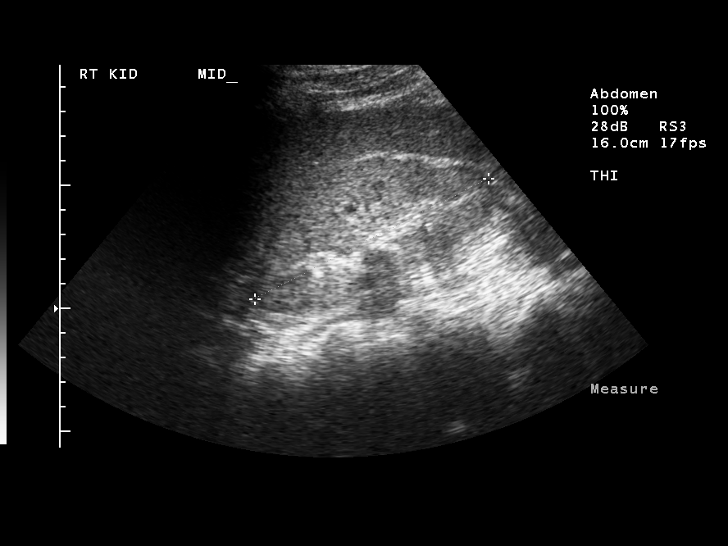
[im 5/29]
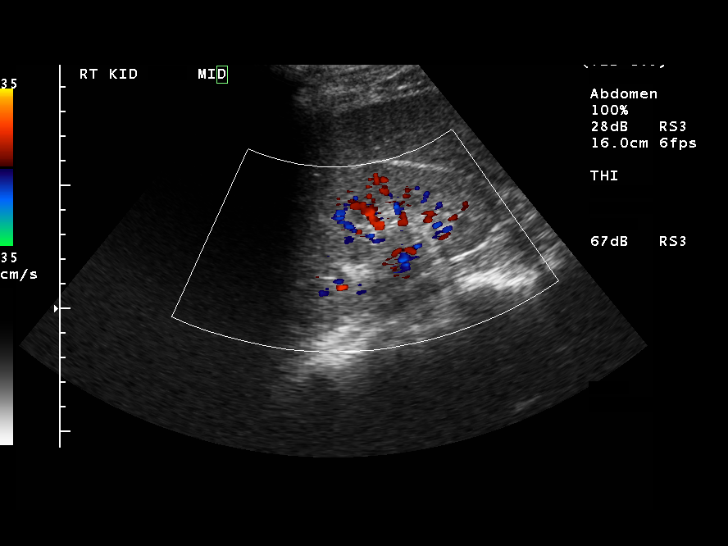
[im 8/29]
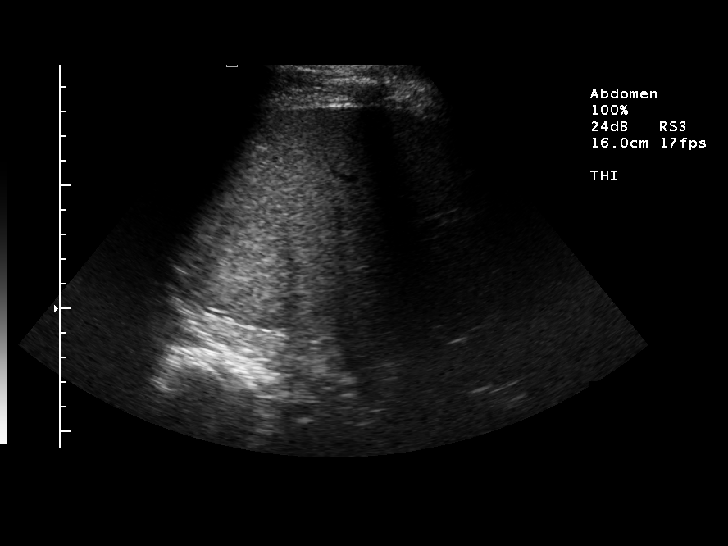
[im 10/29]
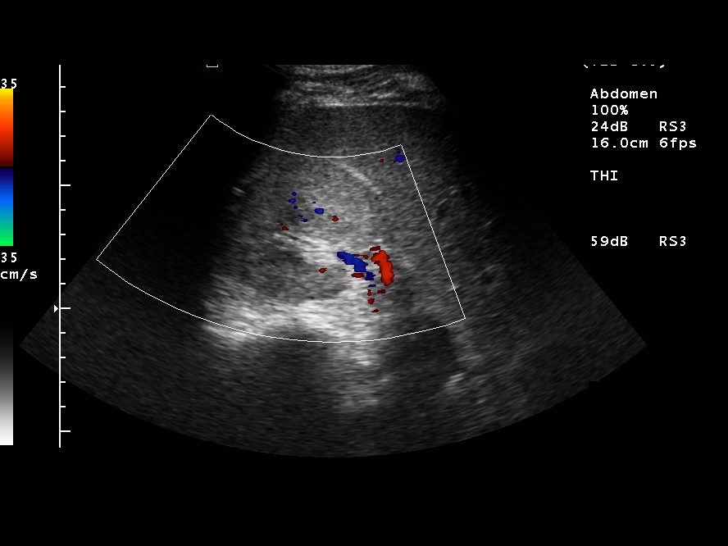
[im 11/29]
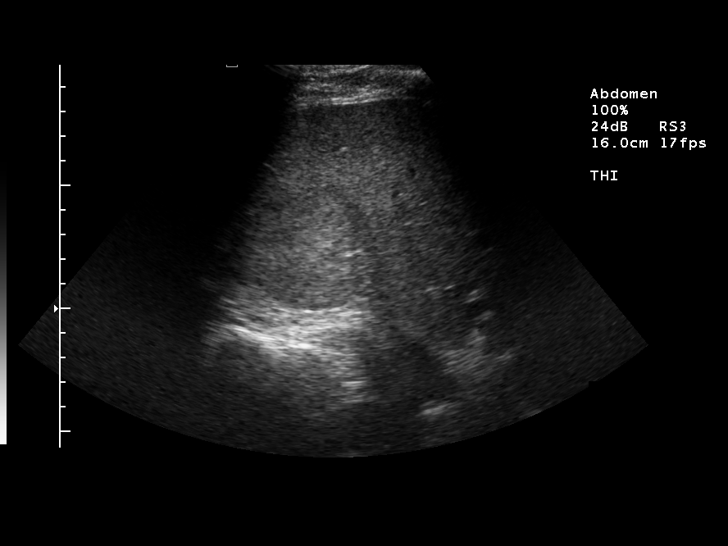
[im 13/29]
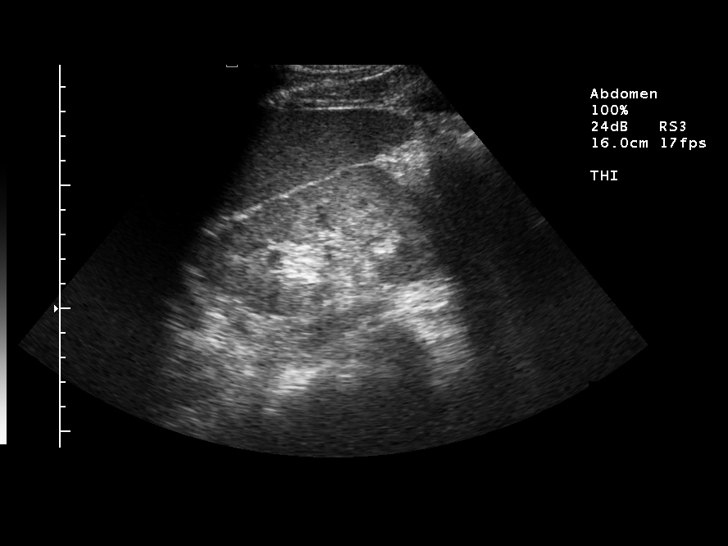
[im 16/29]
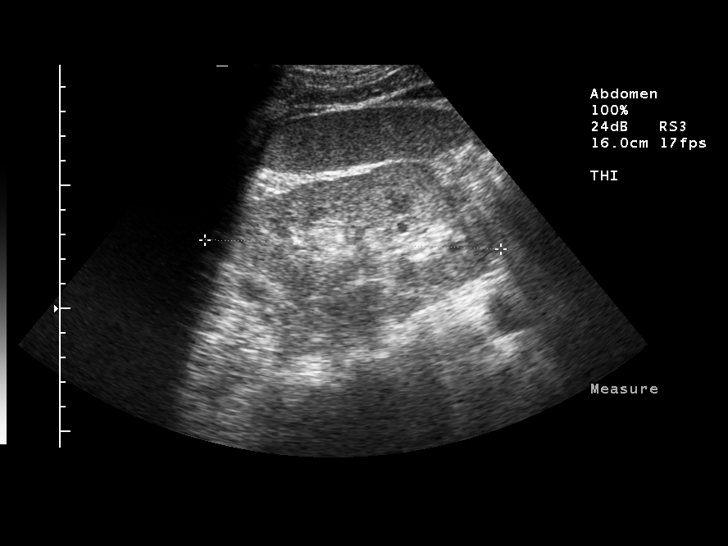
[im 18/29]
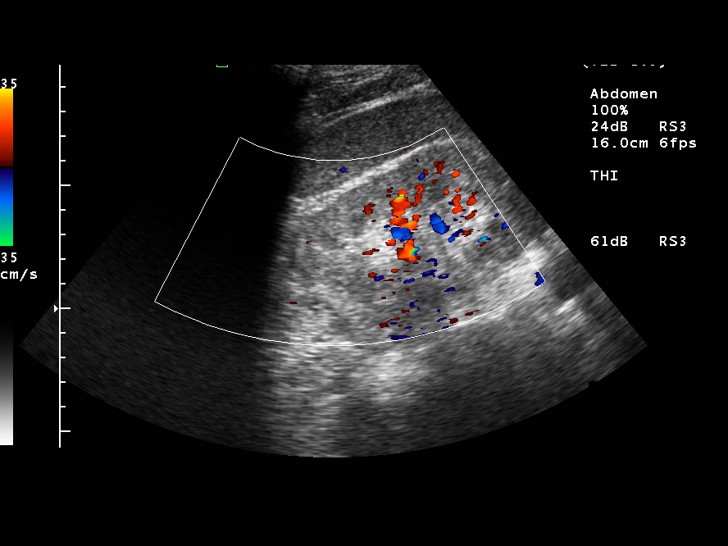
[im 19/29]
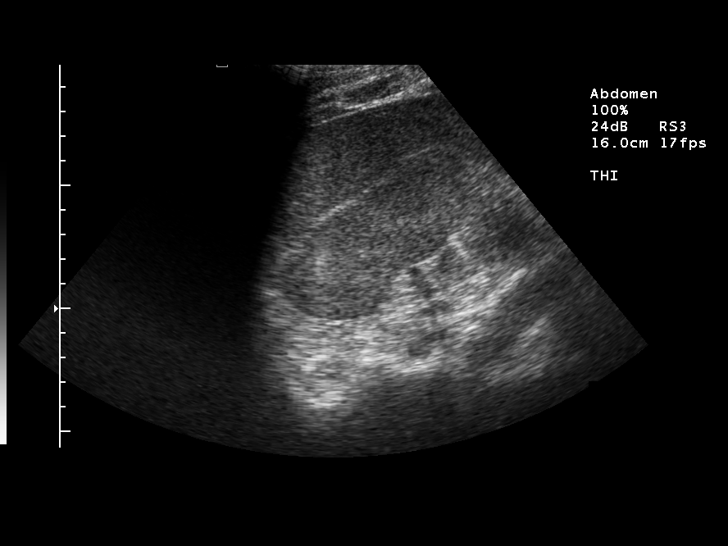
[im 22/29]
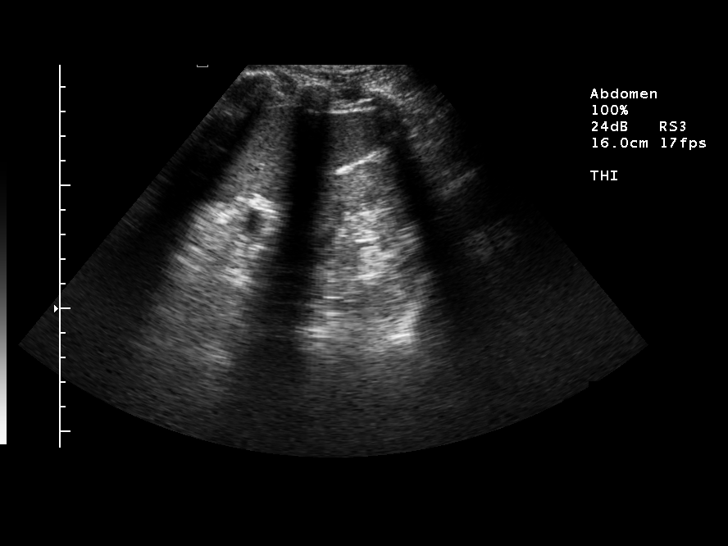
[im 24/29]
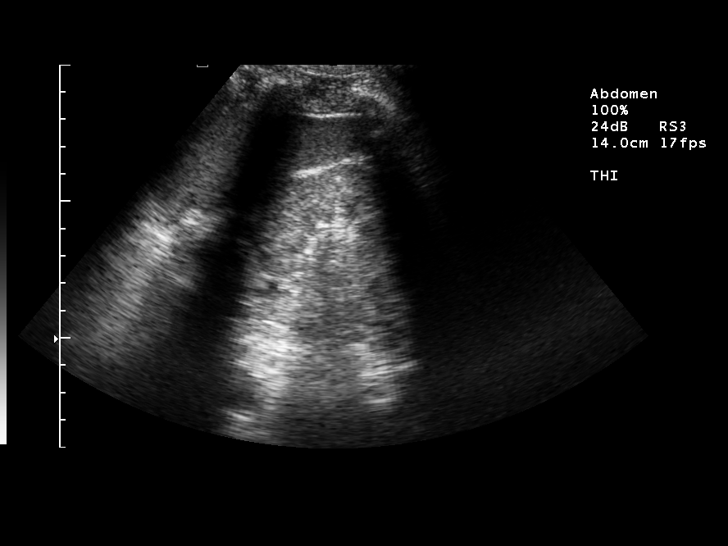
[im 26/29]
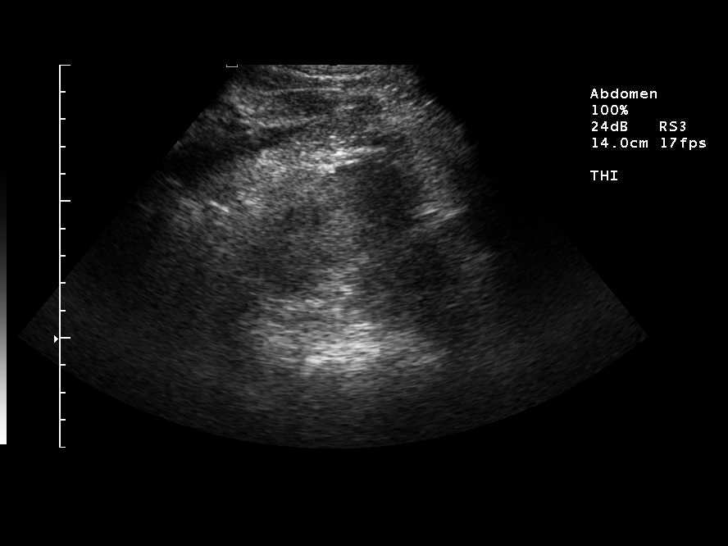
[im 29/29]
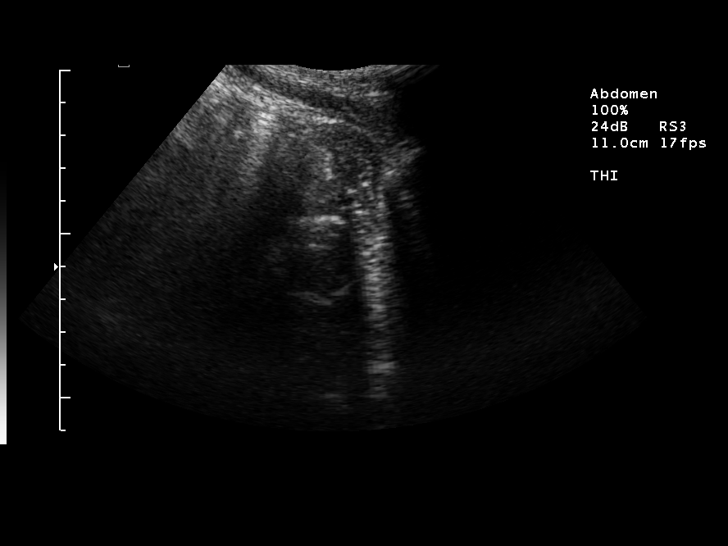

[14 of 25 positions shown; findings below may reference images not displayed]

FINDINGS: The right kidney measures 10.7 cm and has echogenic cortex.  No hydronephrosis or mass is present. 
 The left kidney measures 12.0 cm and shows an echogenic cortex without hydronephrosis or mass.  A Foley catheter is in the urinary bladder which is empty.
IMPRESSION: Hyperechoic kidneys bilaterally, secondary to chronic renal disease.  No hydronephrosis.

## 2007-08-22 IMAGING — CR DG ELBOW 2V*L*
1 series · 3 of 3 positions shown · non-contrast
Comparison: none

REASON FOR EXAM: PAINFUL, SWOLLEN FROM FALL.
COMMENTS:

[Series 1: view not recorded · 0.17mm/px · 3 of 3 slices shown]
[im 1/3]
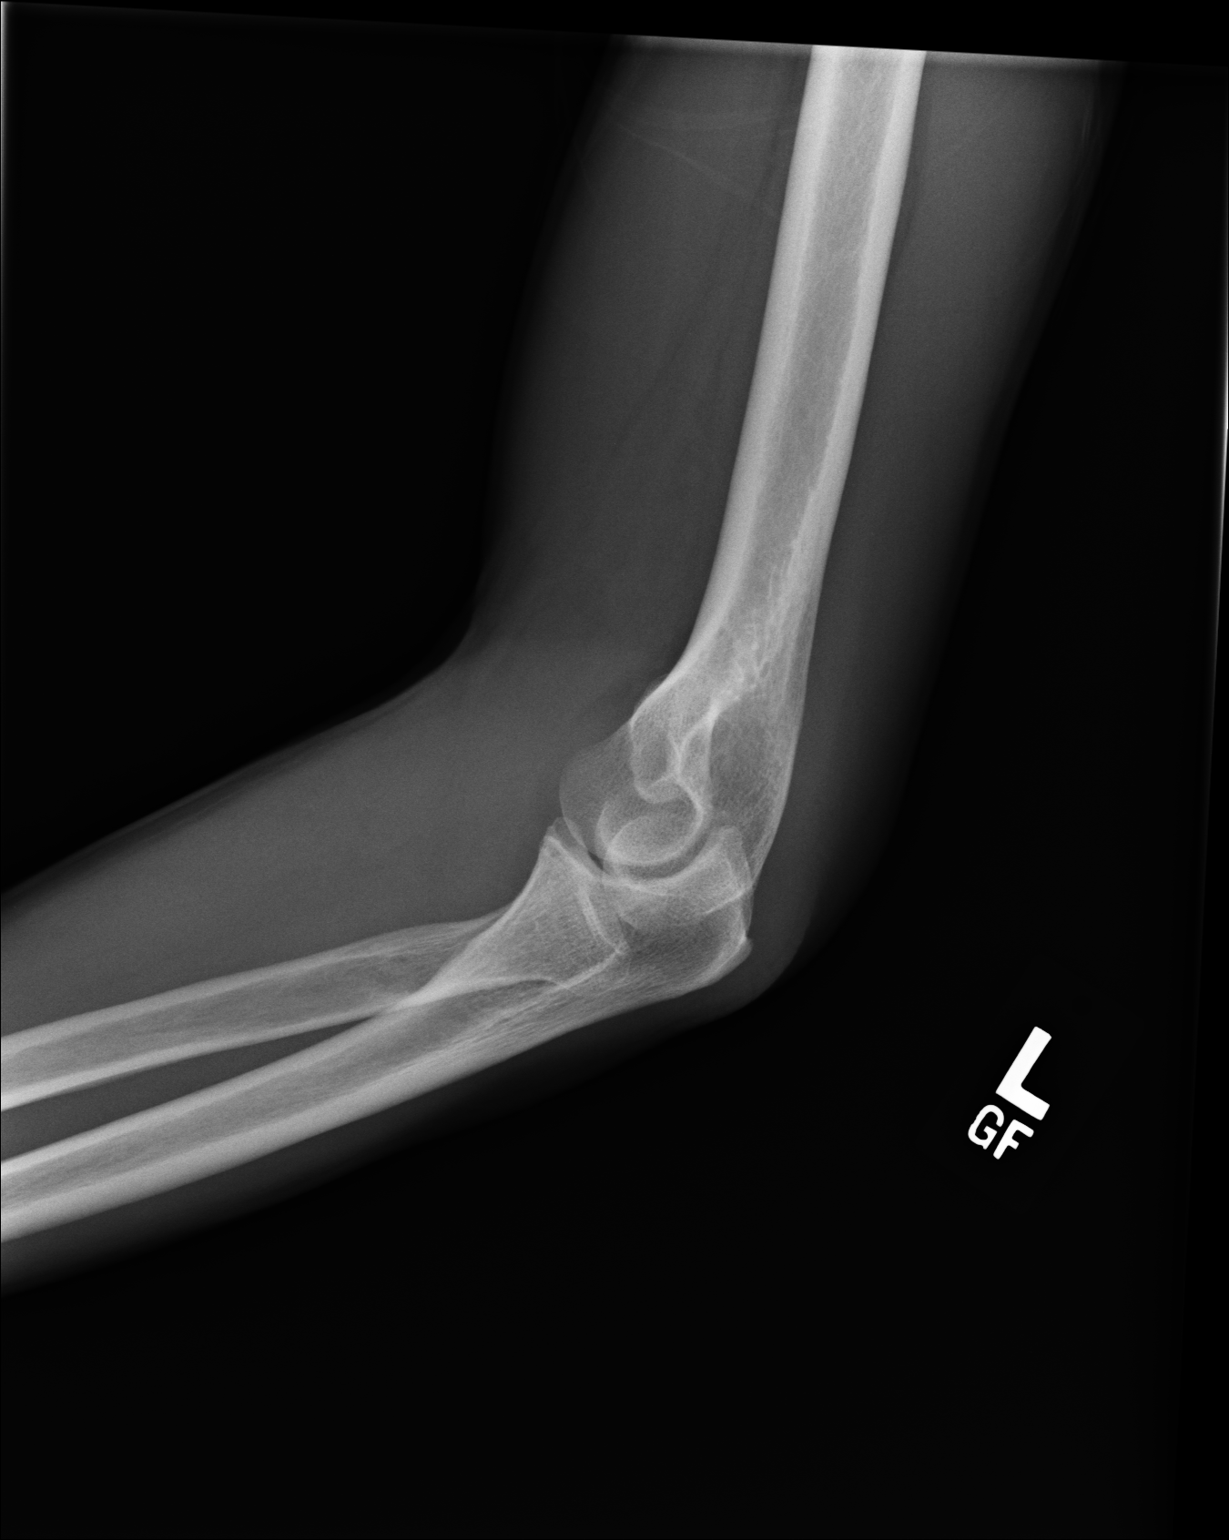
[im 2/3]
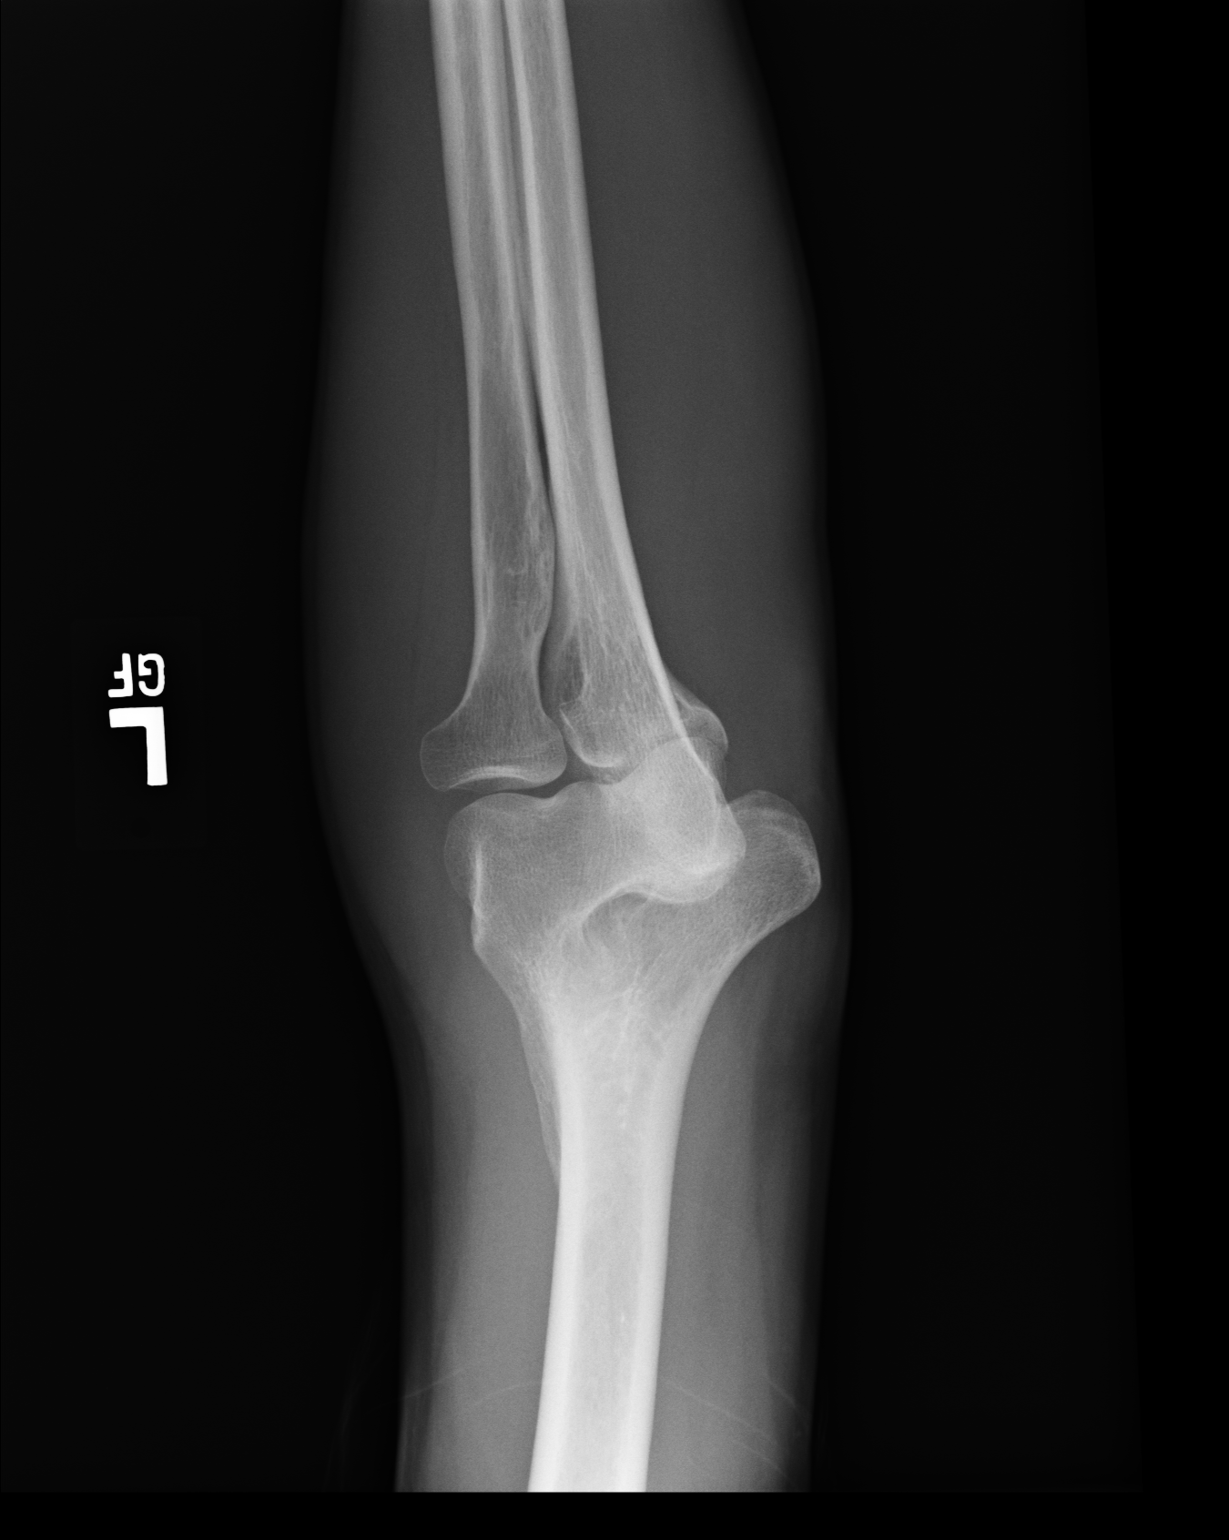
[im 3/3]
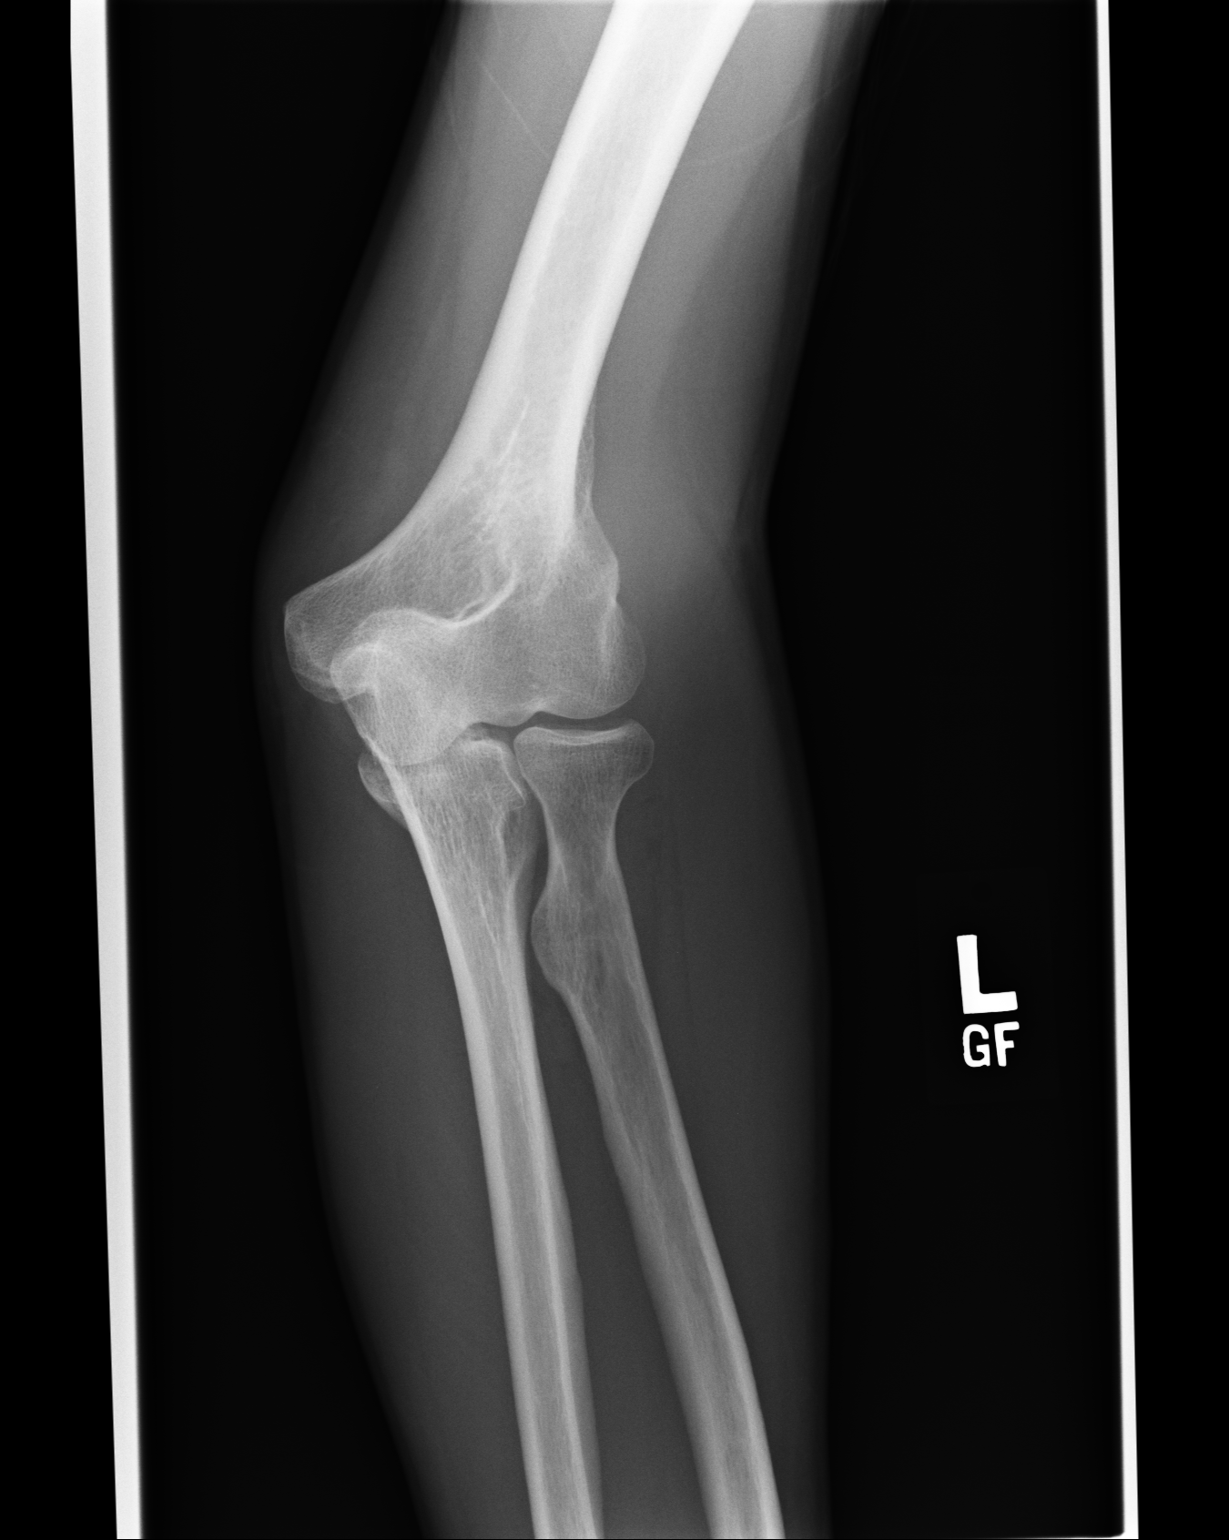

[3 of 3 positions shown; findings below may reference images not displayed]

PROCEDURE:     DXR - DXR ELBOW LEFT AP AND LATERAL  - February 14, 2007  [DATE]

RESULT:     Images the left elbow demonstrate no definite fracture,
dislocation or radiopaque foreign body. The patient is apparently unable to
fully extend the elbow. This is a limited examination. If the patient has
persistent symptoms then a complete 4 view elbow series is recommended.
IMPRESSION: Please see above.

## 2007-08-28 IMAGING — CR DG CHEST 2V
3 series · 3 of 3 positions shown · non-contrast
Comparison: 11/19/2006

CLINICAL DATA: Chest pain, fell from ladder five days.  Left elbow pain. 
LEFT ELBOW - 4 VIEW:

[w chest lat (1 of 2)]
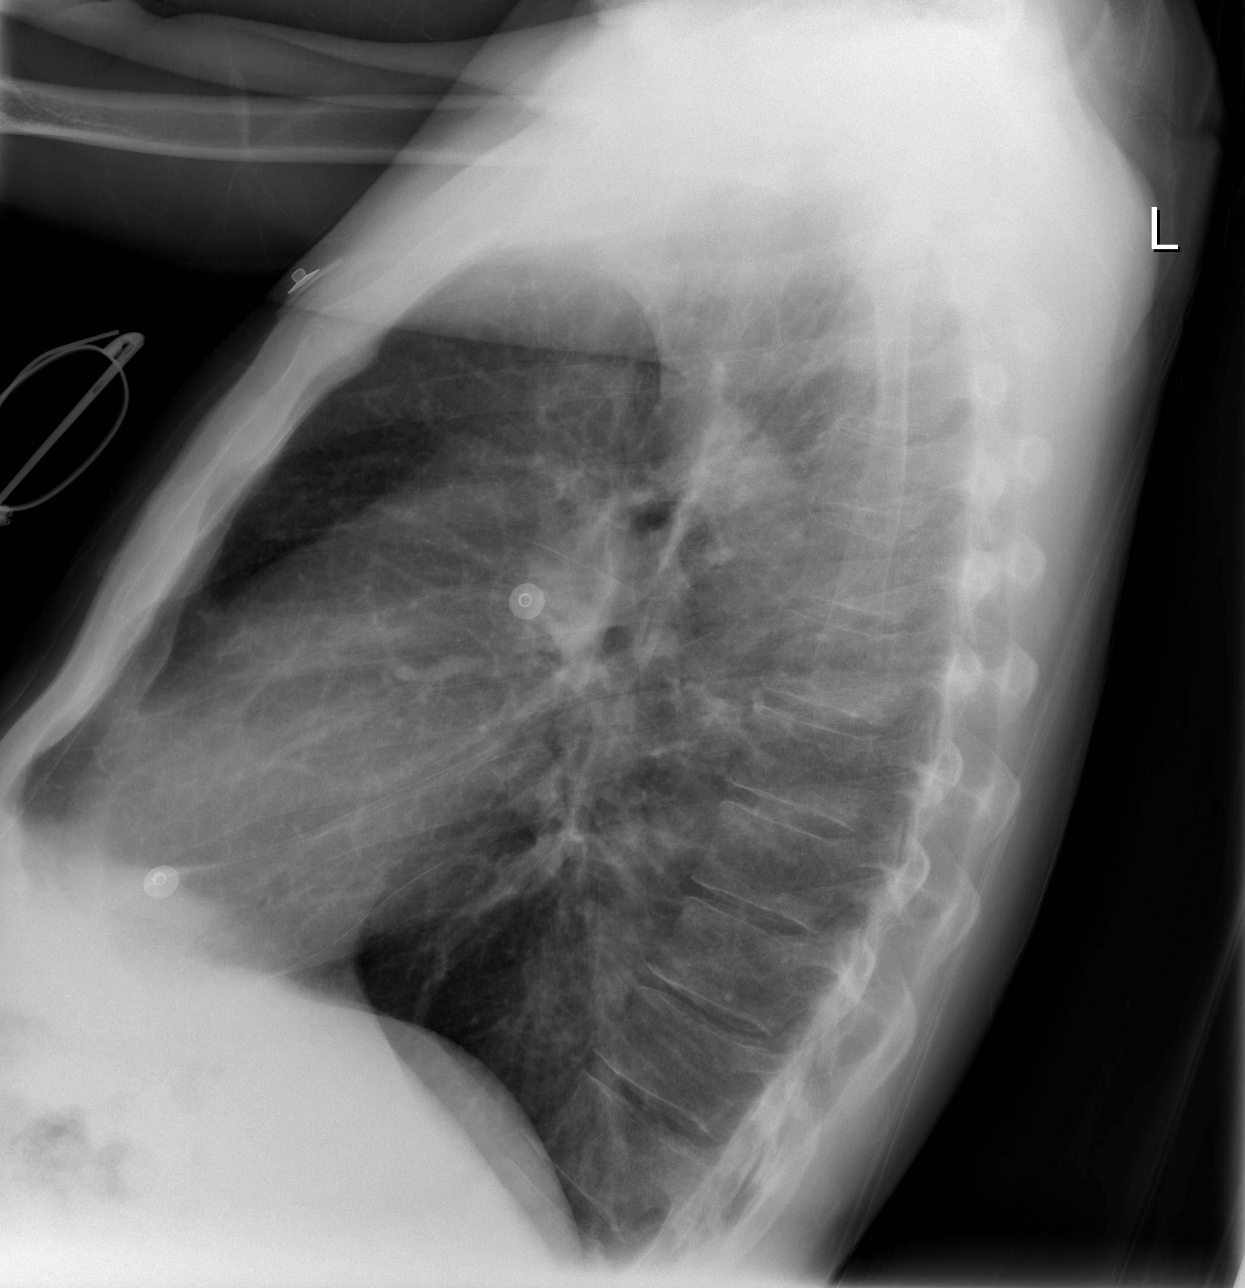

[w chest lat (2 of 2)]
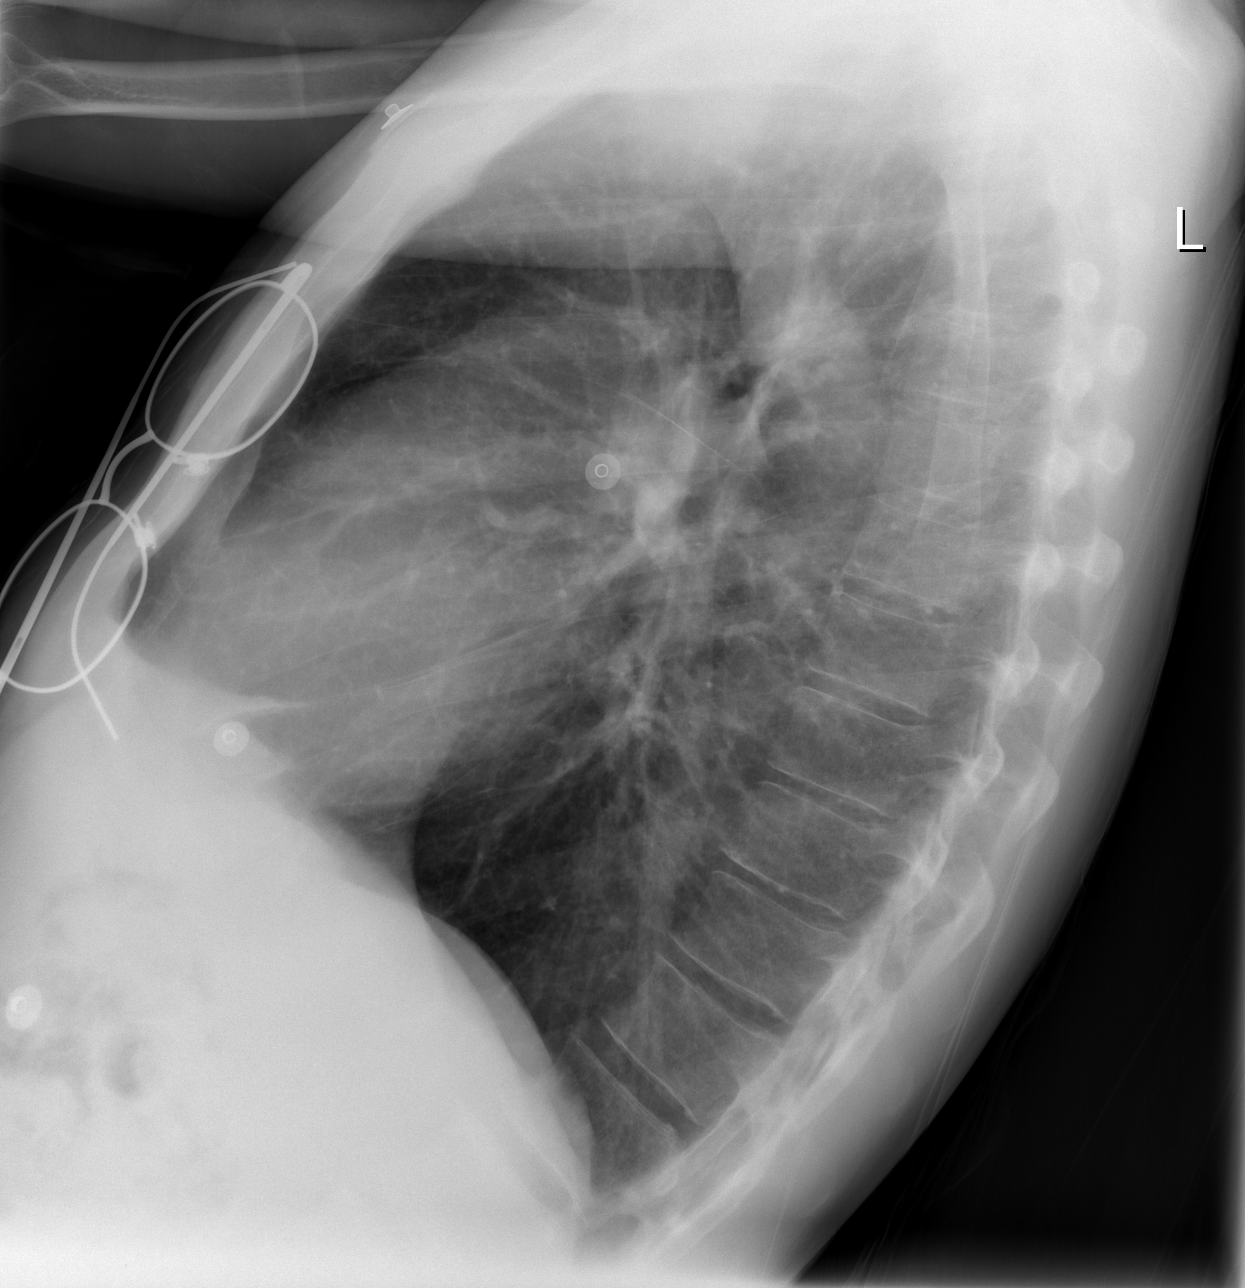

[w chest pa]
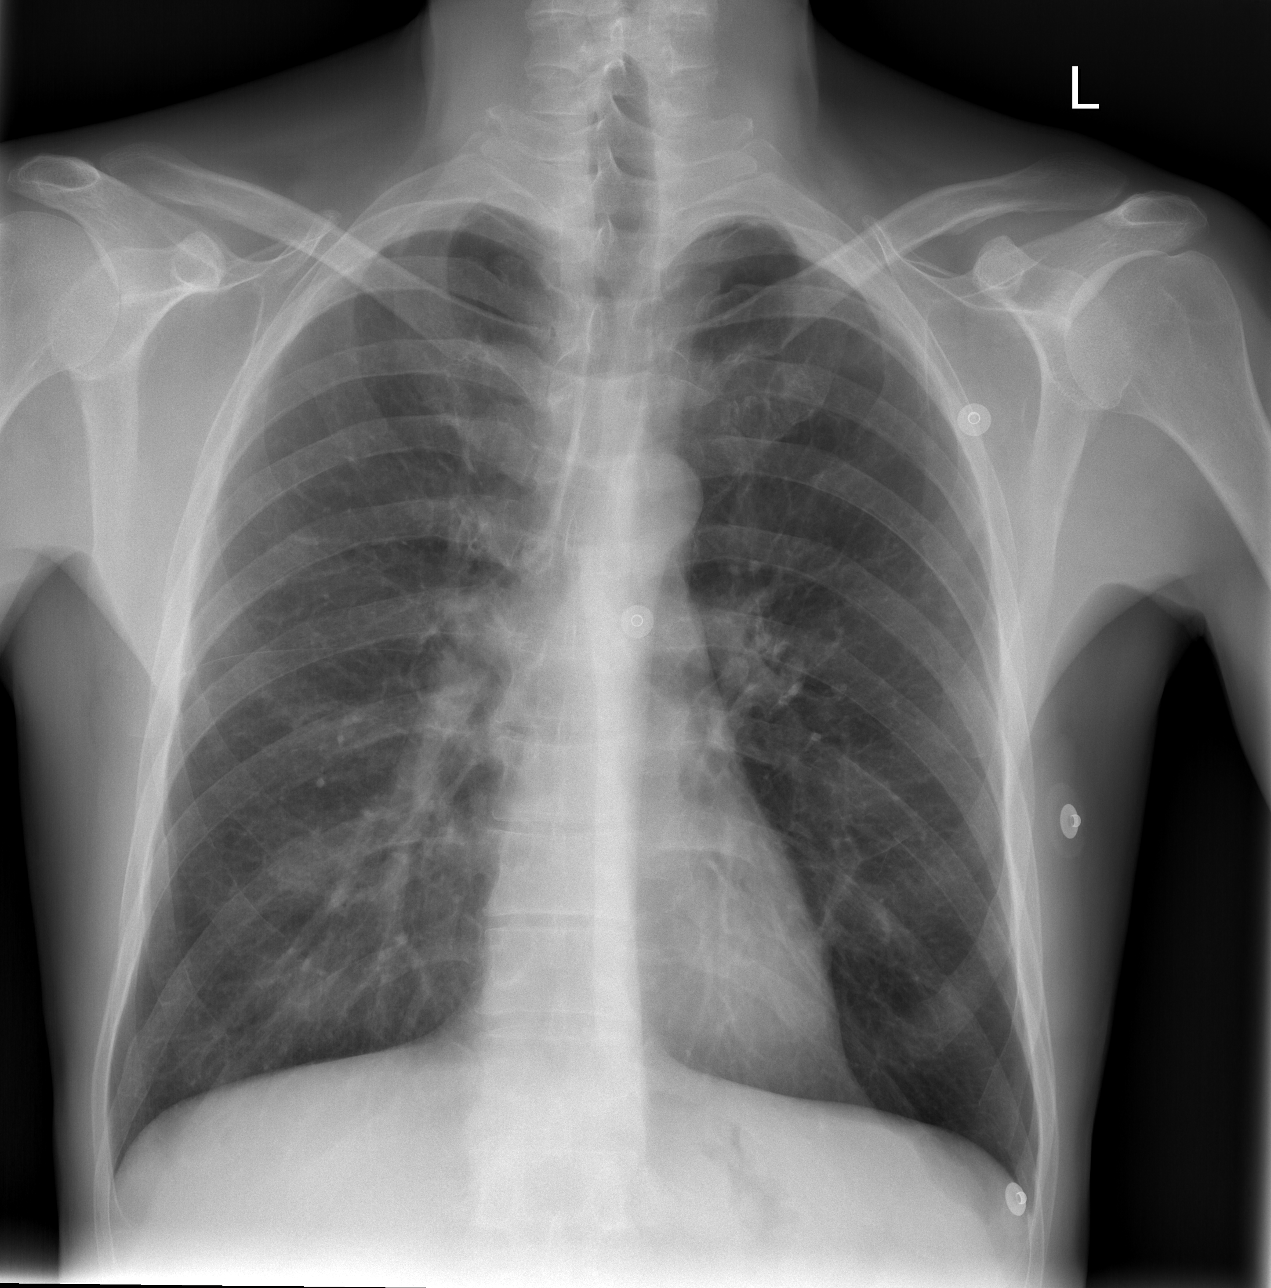

[3 of 3 positions shown; findings below may reference images not displayed]

FINDINGS: There may be a very small effusion present. No definite fracture, subluxation or dislocation noted.
IMPRESSION: Question small effusion. No acute bony abnormality.
CHEST - 2 VIEW:
FINDINGS: Subtle increased focal opacity within the right lower lobe is noted and cannot exclude early mild pneumonia but recommend followup to resolution. Lungs otherwise clear. The cardiomediastinal silhouette is stable.
IMPRESSION: Question subtle airspace disease in the right lower lobe-mild pneumonia not excluded. Recommend followup to resolution.

## 2007-08-28 IMAGING — CR DG ELBOW COMPLETE 3+V*L*
4 series · 4 of 4 positions shown · non-contrast
Comparison: 11/19/2006

CLINICAL DATA: Chest pain, fell from ladder five days.  Left elbow pain. 
LEFT ELBOW - 4 VIEW:

[w elbow joint a.p. left]
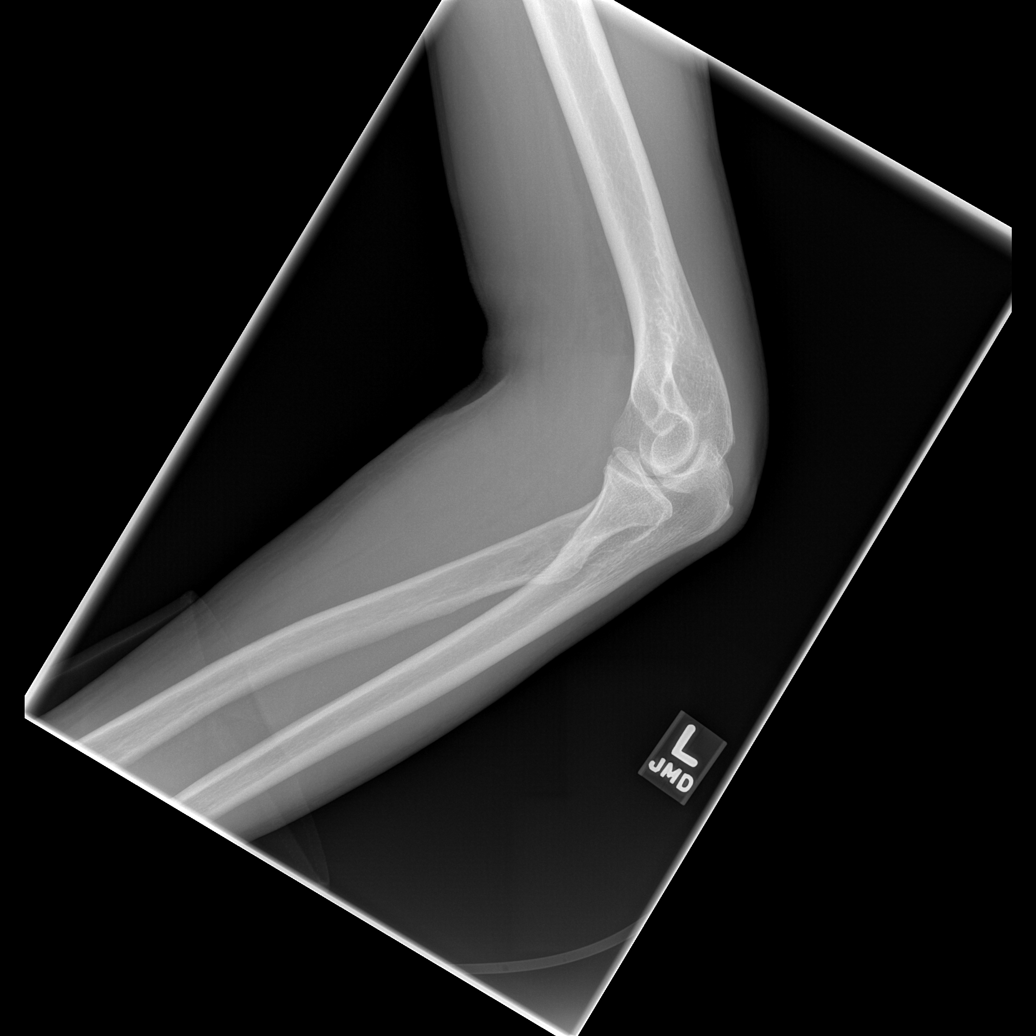

[w elbow joint obl left]
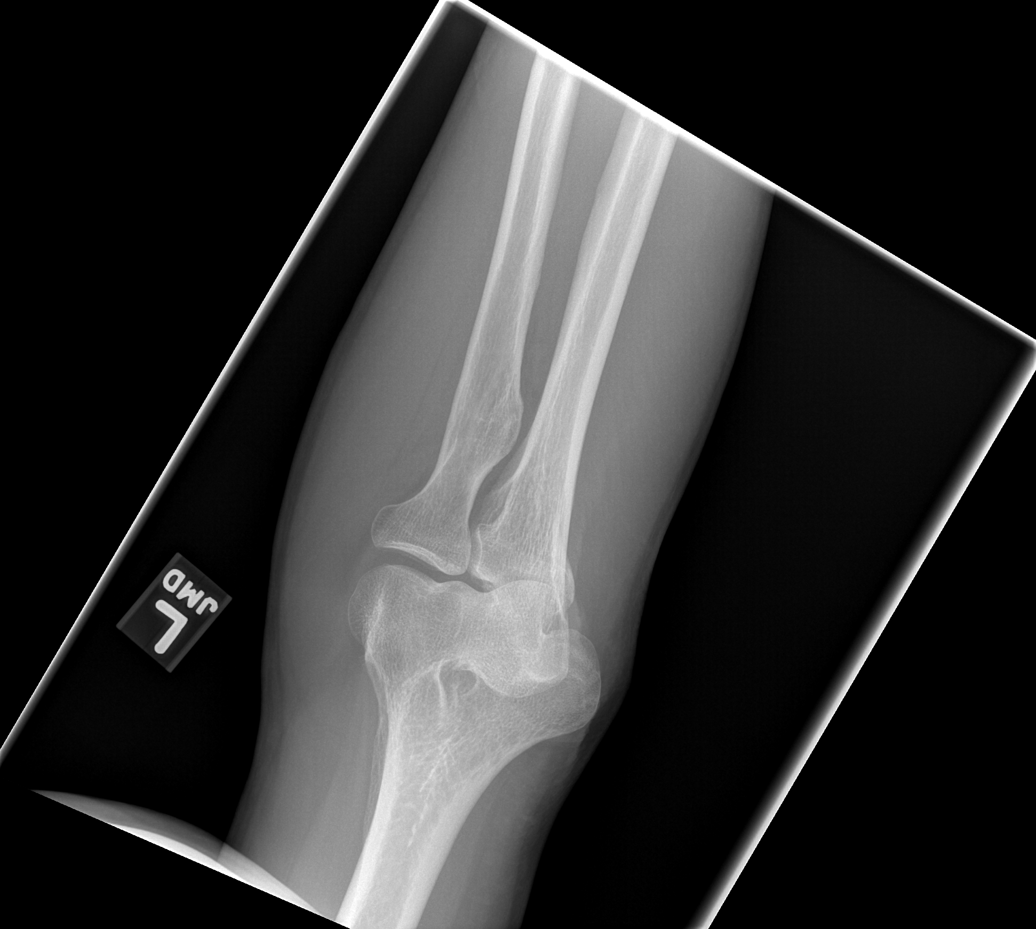

[w elbow joint lat left (1 of 2)]
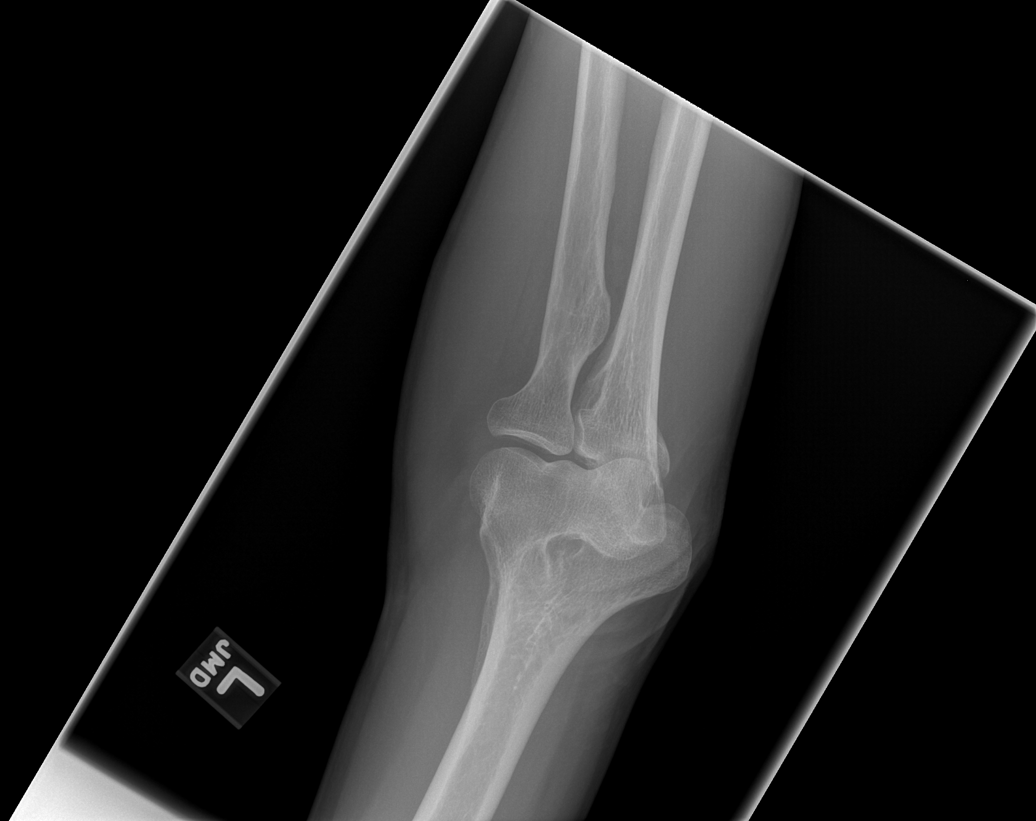

[w elbow joint lat left (2 of 2)]
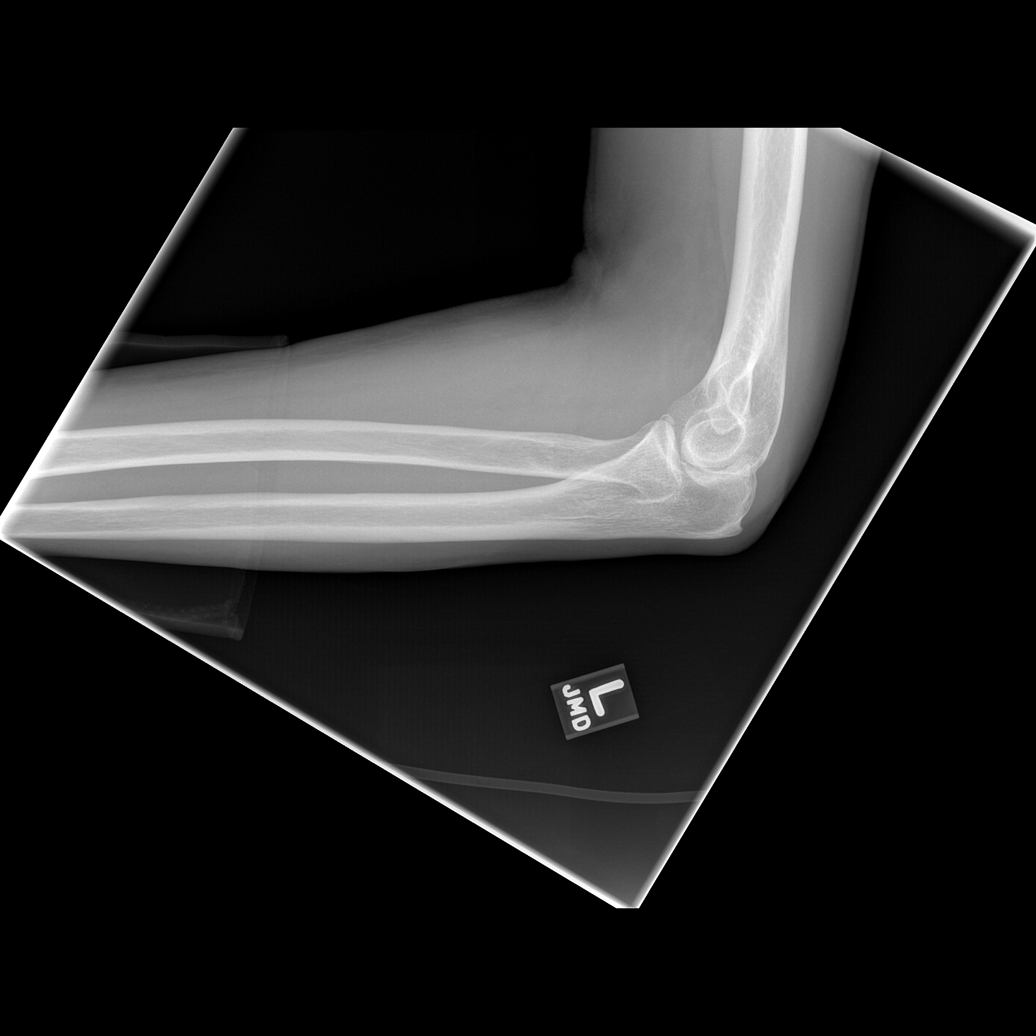

[4 of 4 positions shown; findings below may reference images not displayed]

FINDINGS: There may be a very small effusion present. No definite fracture, subluxation or dislocation noted.
IMPRESSION: Question small effusion. No acute bony abnormality.
CHEST - 2 VIEW:
FINDINGS: Subtle increased focal opacity within the right lower lobe is noted and cannot exclude early mild pneumonia but recommend followup to resolution. Lungs otherwise clear. The cardiomediastinal silhouette is stable.
IMPRESSION: Question subtle airspace disease in the right lower lobe-mild pneumonia not excluded. Recommend followup to resolution.

## 2007-08-29 IMAGING — US US ABDOMEN COMPLETE
1 series · 14 of 25 positions shown · non-contrast
Comparison: Renal ultrasound of 01/29/07.

CLINICAL DATA: Abdominal pain and renal failure. 
 ABDOMEN ULTRASOUND:
TECHNIQUE: Complete abdominal ultrasound examination was performed including evaluation of the liver, gallbladder, bile ducts, pancreas, kidneys, spleen, IVC, and abdominal aorta.

[Series 1: unknown · 0.35mm/px · 14 of 61 slices shown]
[im 1/61]
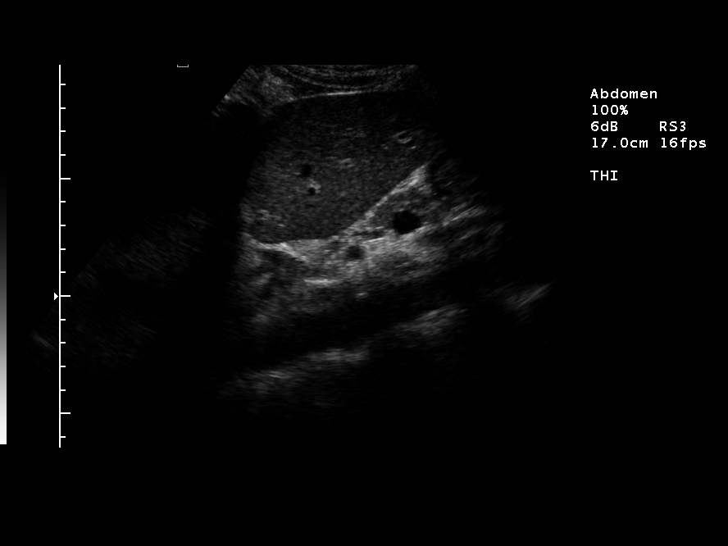
[im 6/61]
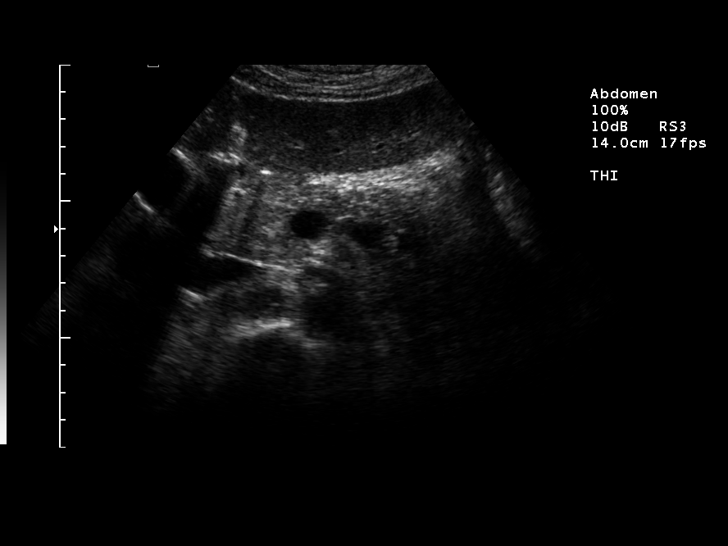
[im 11/61]
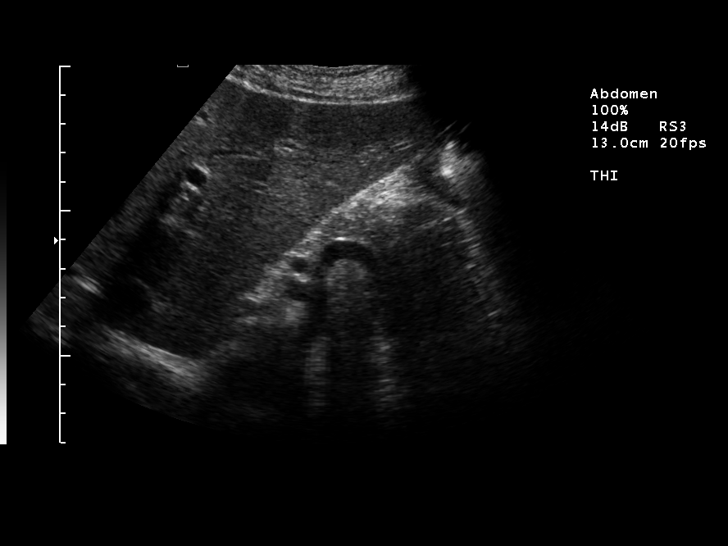
[im 16/61]
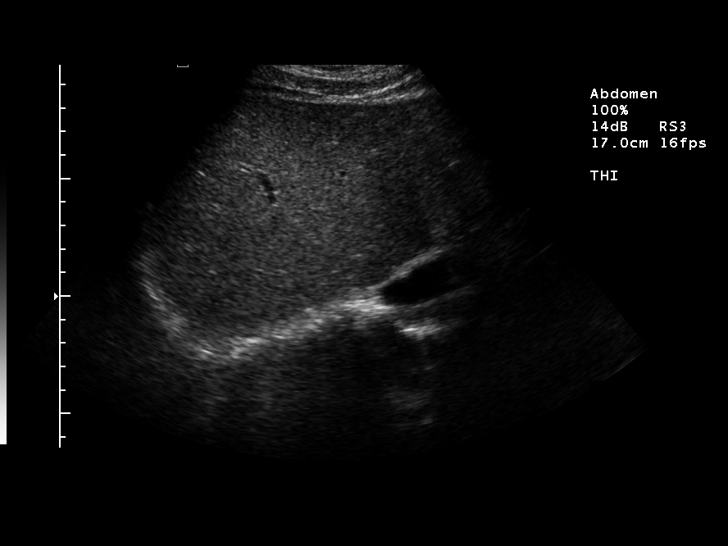
[im 21/61]
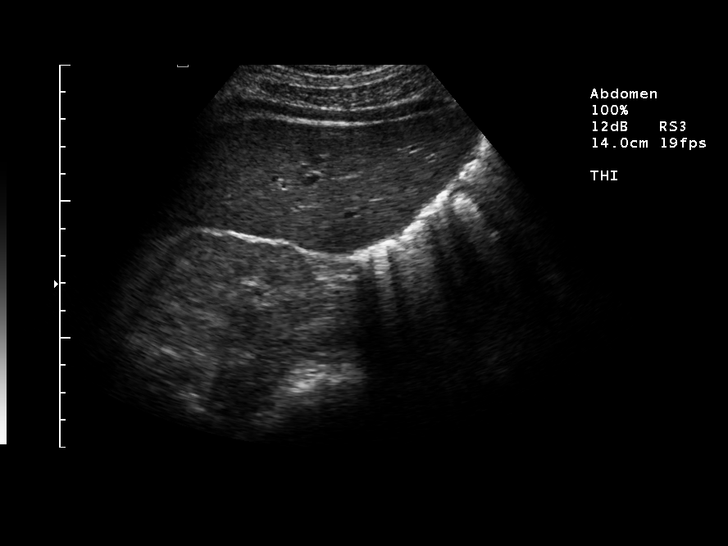
[im 23/61]
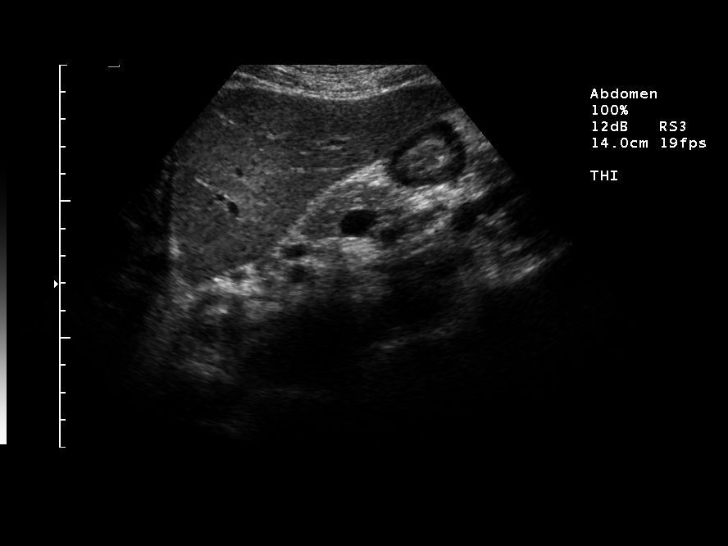
[im 28/61]
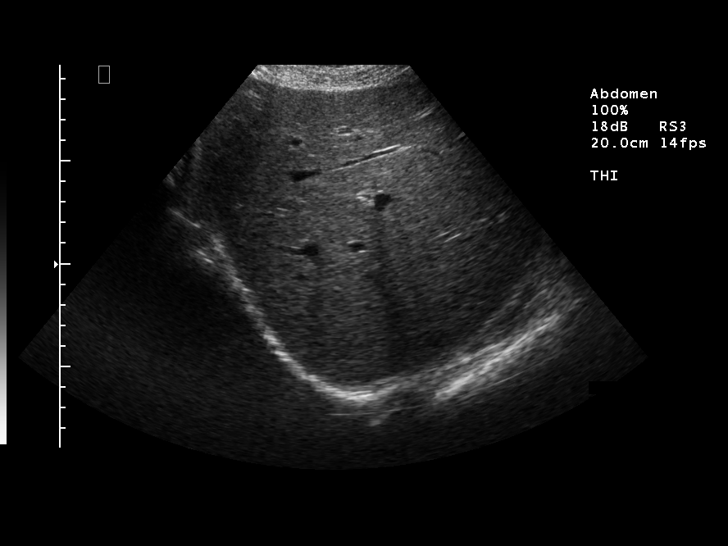
[im 33/61]
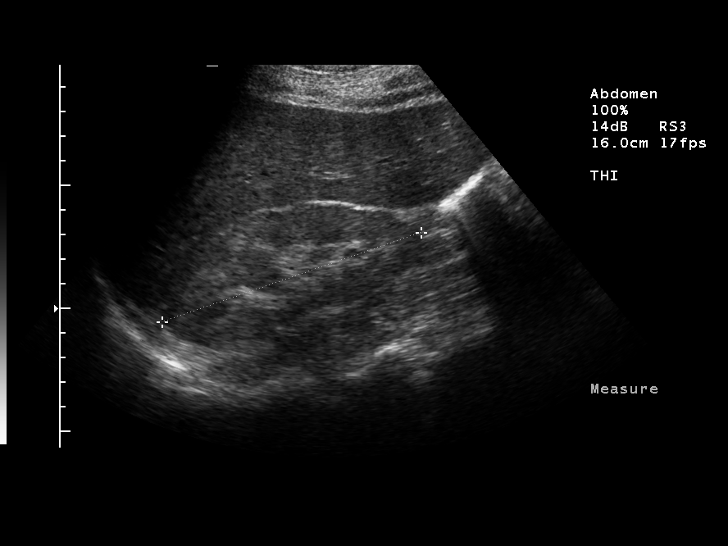
[im 38/61]
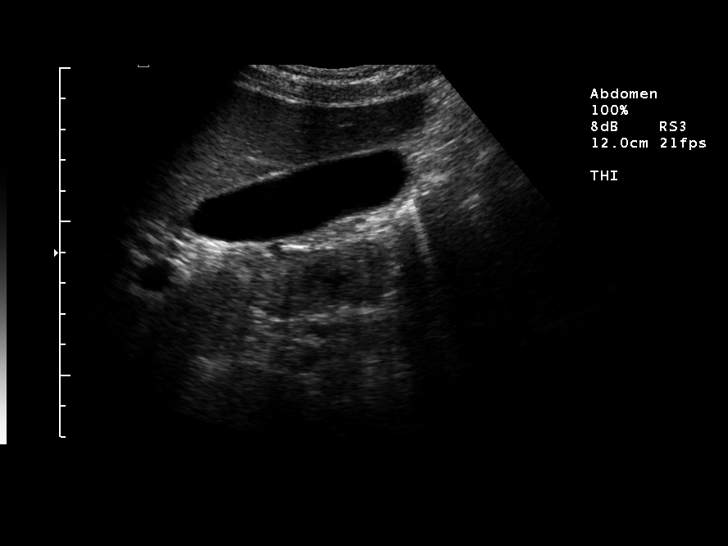
[im 41/61]
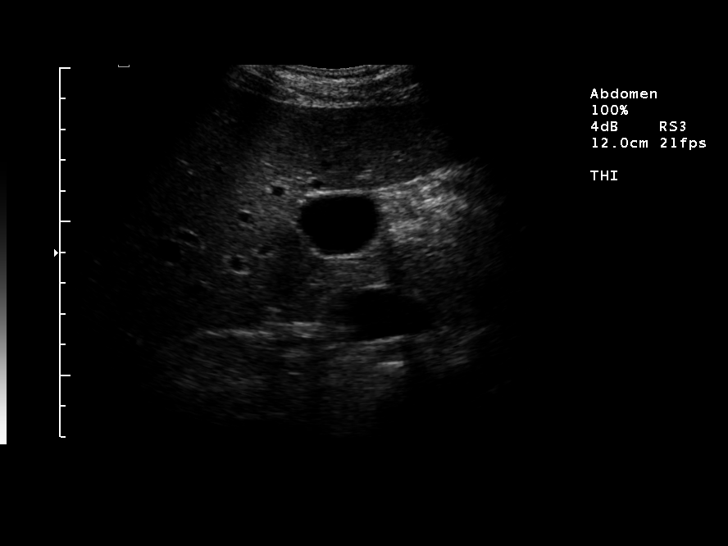
[im 46/61]
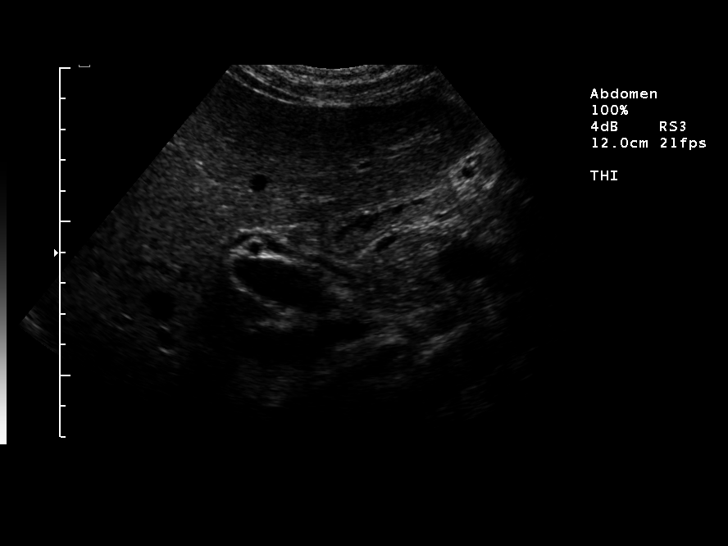
[im 51/61]
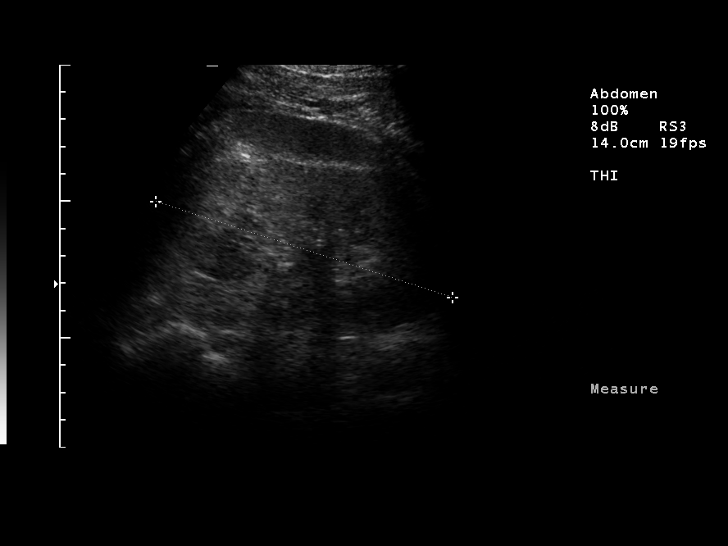
[im 56/61]
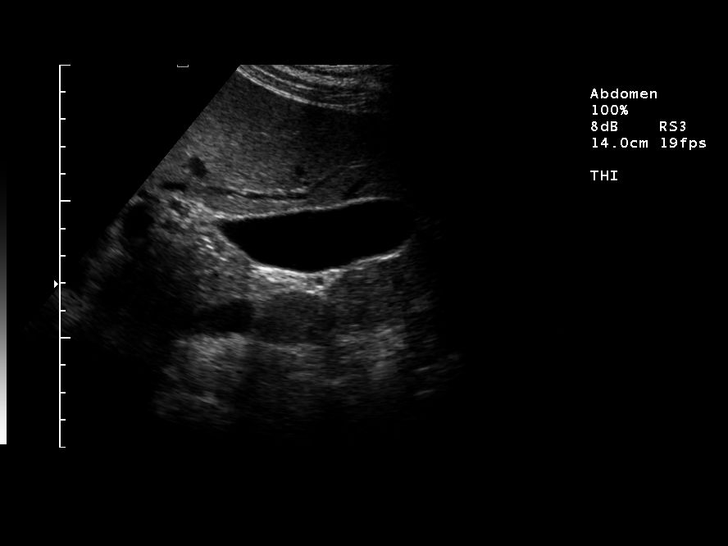
[im 61/61]
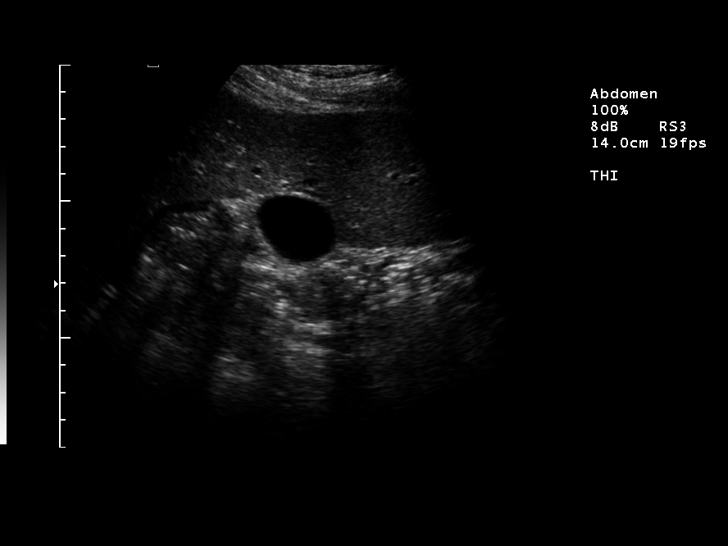

[14 of 25 positions shown; findings below may reference images not displayed]

FINDINGS: Gallbladder:  Normal.  No gallstones or wall thickening. 
 Common bile duct:  Normal caliber.  4 mm.
 Liver:  Normal echotexture.  No focal masses or intrahepatic biliary ductal dilatation.
 IVC:  Normally patent. 
 Pancreas:  Normal visualized head and body of pancreas.  The tail is obscured by bowel gas.
 Spleen:  Normal echotexture and size.
 Kidneys:  Right kidney measures 11.3 cm; left kidney 11.4 cm.  Both demonstrate stable evidence of chronic renal disease with diffuse increase in cortical echogenicity and loss of cortical medullary differentiation.  These are stable findings when compared to the renal ultrasound dated 01/29/07.  There is no hydronephrosis.  
 Abdominal aorta:  Normal caliber.
IMPRESSION: Stable bilateral echogenic kidneys of normal size.  There is no evidence of renal obstruction.  The rest of the study shows no abnormalities.

## 2007-09-13 IMAGING — CR DG CHEST 1V PORT
1 series · 2 of 2 positions shown · non-contrast
Comparison: none

REASON FOR EXAM: opiate withdrawal
COMMENTS:

[Series 1: view not recorded · 0.17mm/px · 2 of 2 slices shown]
[im 1/2]
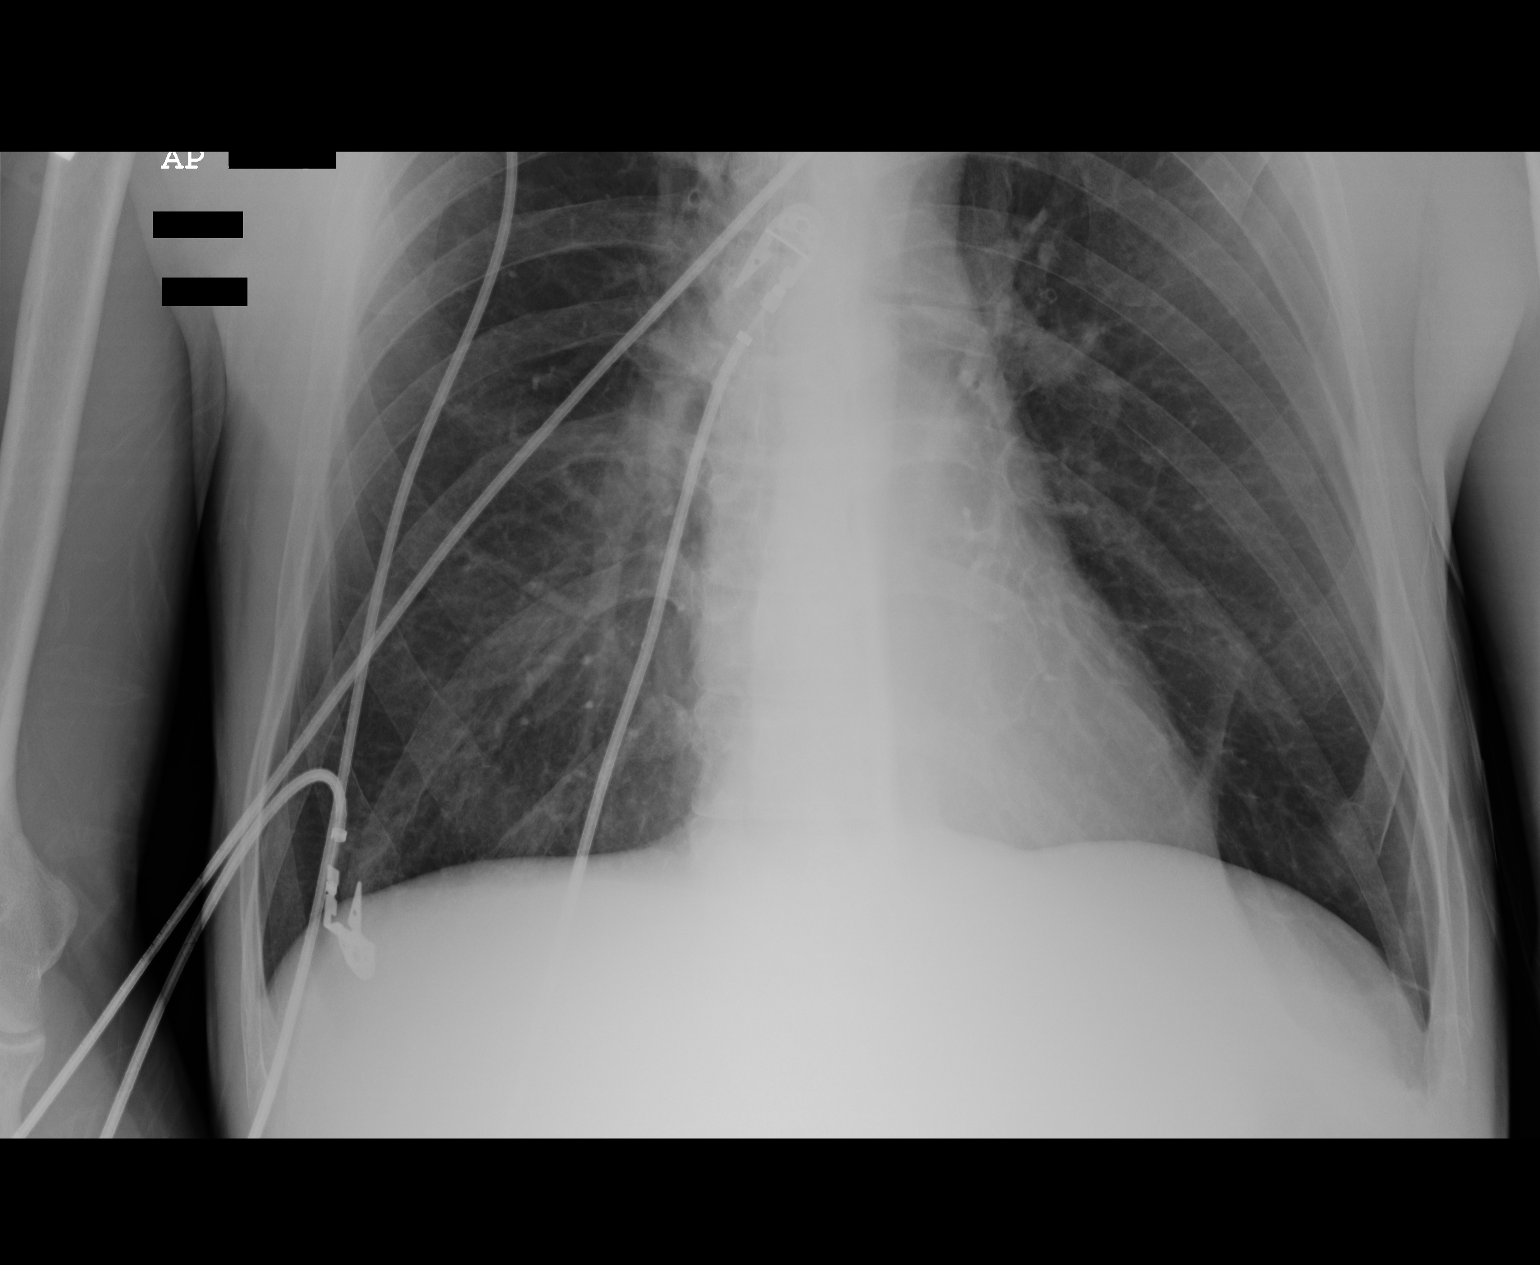
[im 2/2]
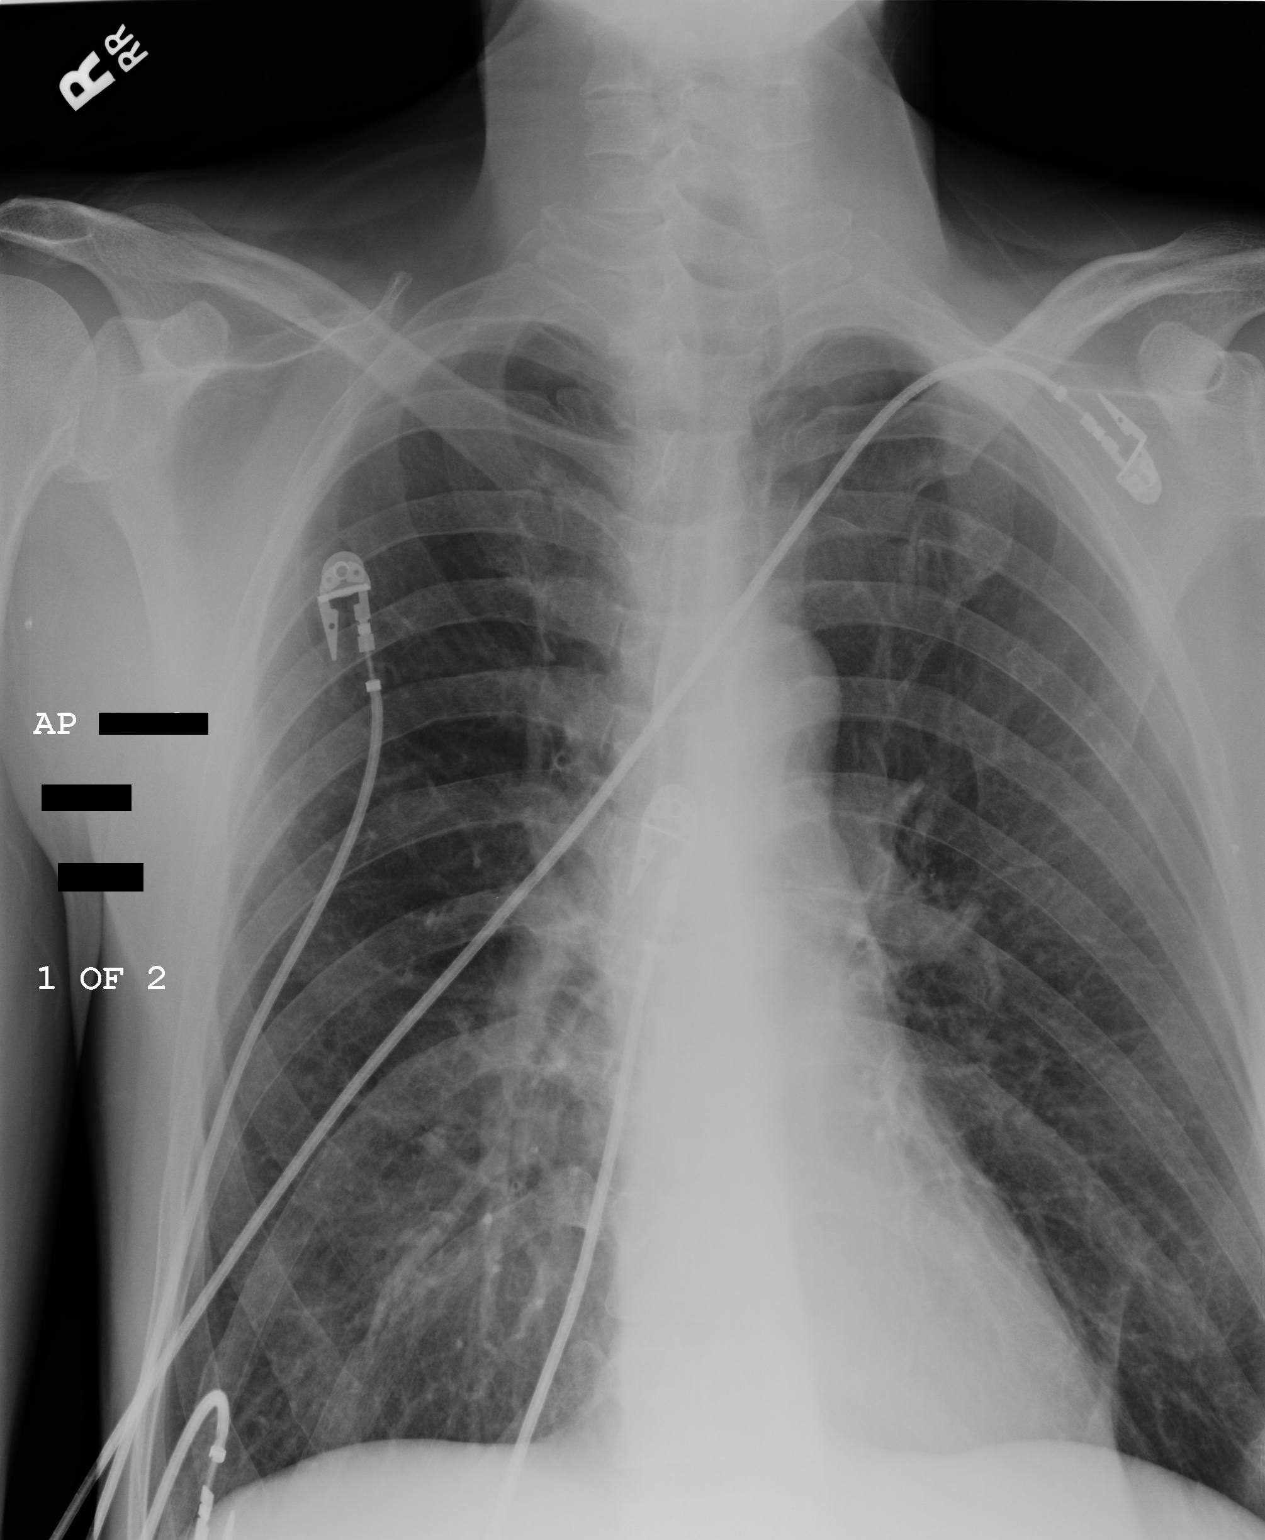

[2 of 2 positions shown; findings below may reference images not displayed]

PROCEDURE:     DXR - DXR PORTABLE CHEST SINGLE VIEW  - March 08, 2007 [DATE]

RESULT:        Comparison is made to a prior study dated 11/07/06.

The lungs are hyperinflated. There is thickening of the interstitial
markings.  No focal regions of consolidation are appreciated. The cardiac
silhouette and visualized bony skeleton are unremarkable.
IMPRESSION: COPD with an element of pulmonary fibrosis.  No focal regions of
consolidation are appreciated.

## 2007-09-14 IMAGING — CR DG CHEST 1V PORT
1 series · 2 of 2 positions shown · non-contrast
Comparison: none

REASON FOR EXAM: ? injury
COMMENTS:

[Series 1: view not recorded · 0.17mm/px · 2 of 2 slices shown]
[im 1/2]
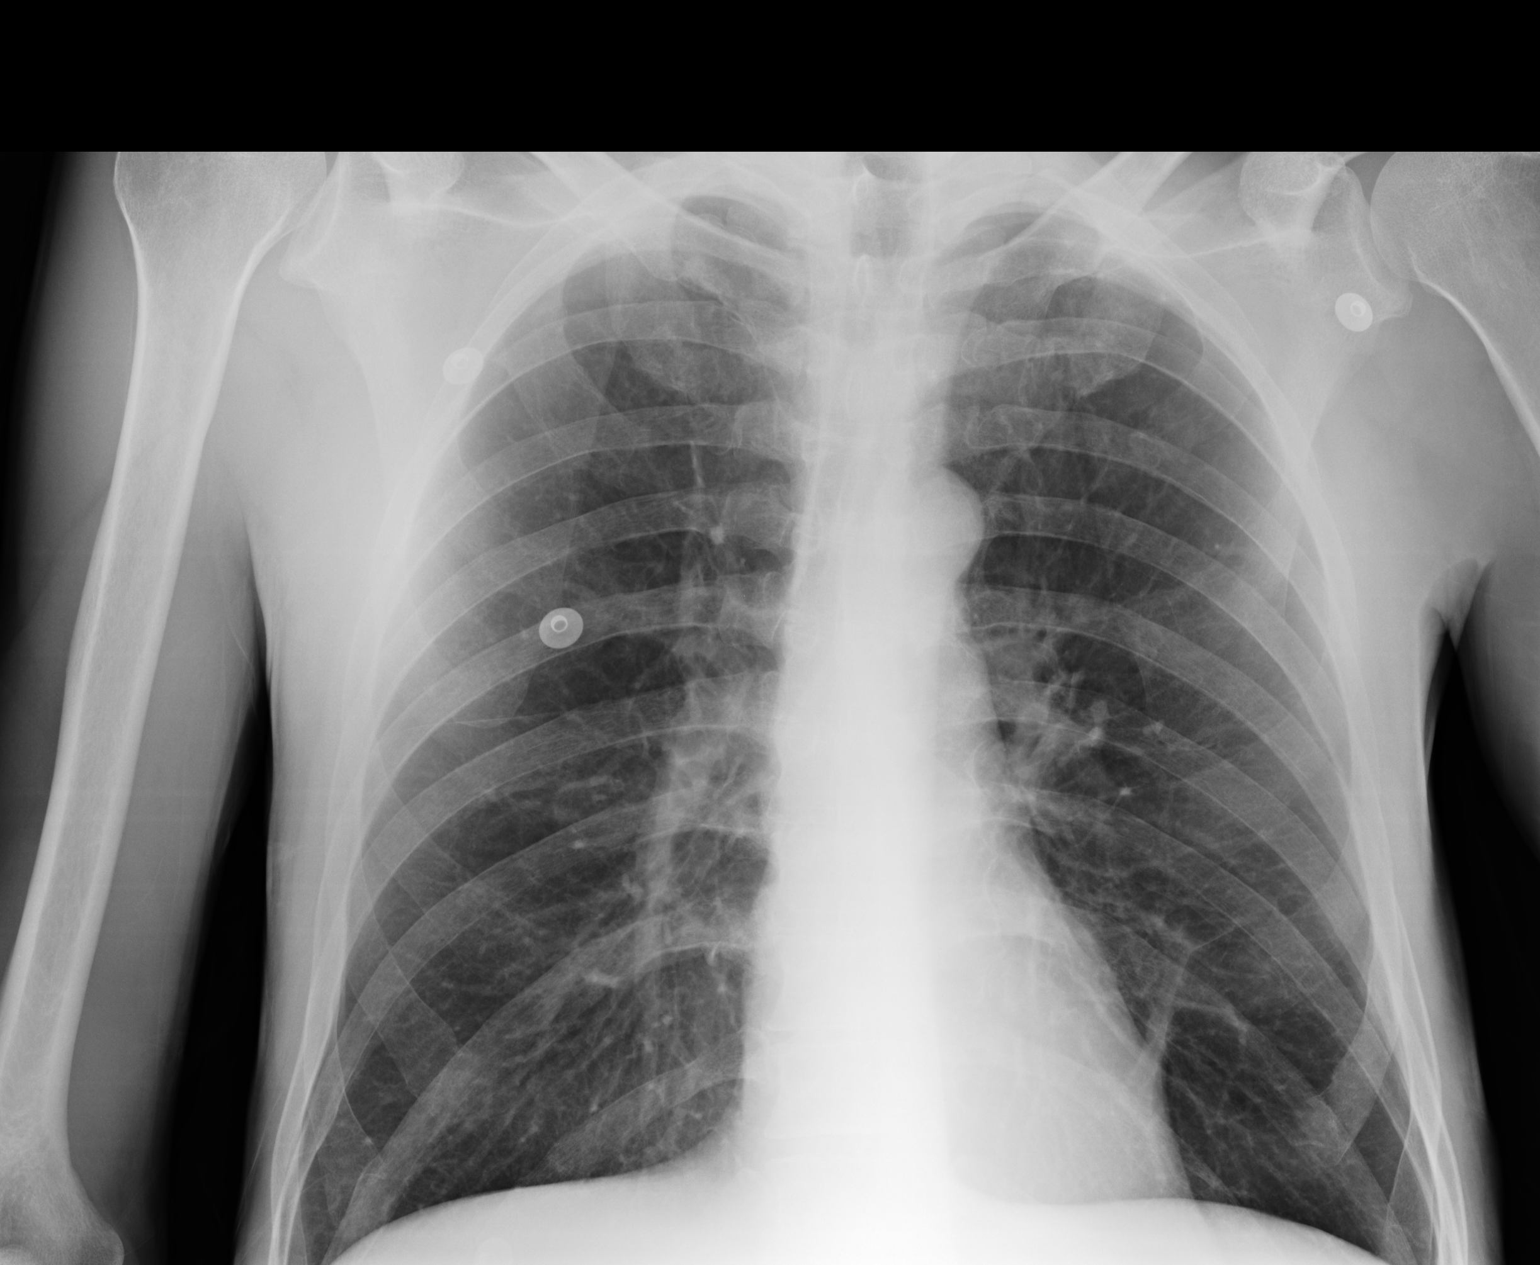
[im 2/2]
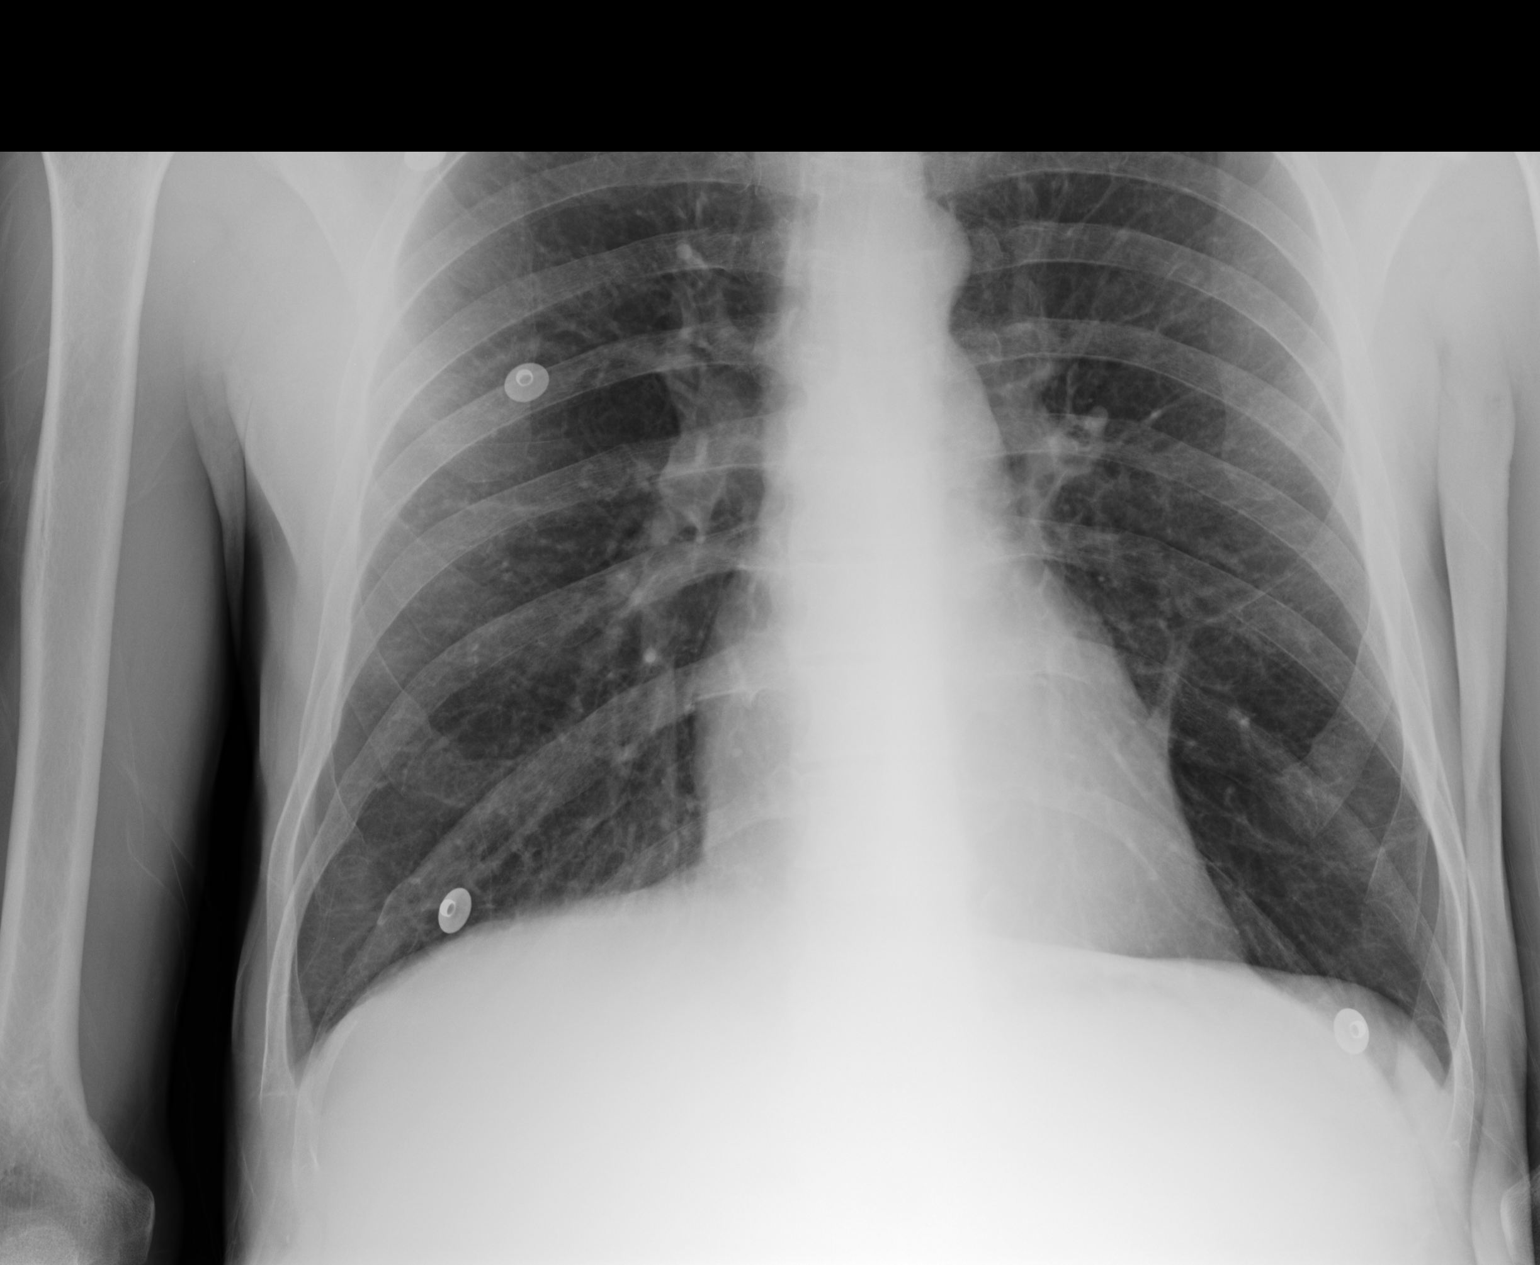

[2 of 2 positions shown; findings below may reference images not displayed]

PROCEDURE:     DXR - DXR PORTABLE CHEST SINGLE VIEW  - March 09, 2007  [DATE]

RESULT:     Comparison is made to the prior exam a 03/08/2007. There is again
noted minimal fibrotic change at the LEFT base. No pneumonia or other acute
change is observed. The chest is hyperexpanded compatible with a history
COPD or asthma.
IMPRESSION: 1. Stable appearing chest as compared to the exam 03/08/2007.
[DATE]. Fibrotic changes are again noted at the LEFT base.
3. The chest is hyperexpanded compatible with history of COPD or asthma.

## 2007-10-10 IMAGING — CR DG ELBOW COMPLETE 3+V*L*
4 series · 4 of 4 positions shown · non-contrast
Comparison: 02/20/07

CLINICAL DATA: Fall.  Left elbow trauma.  Pain and swelling.
 LEFT ELBOW ? 4 VIEWS:

[x elbow joint ap left]
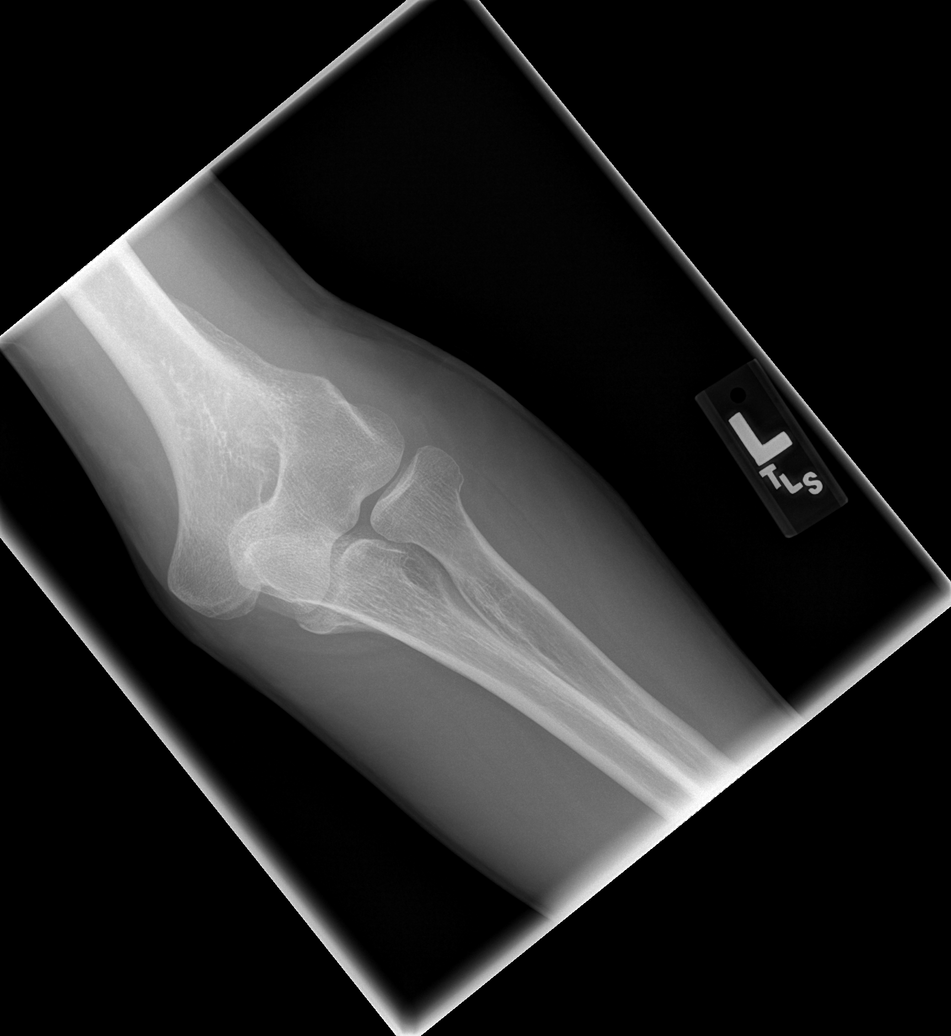

[x elbow joint obl. left (1 of 2)]
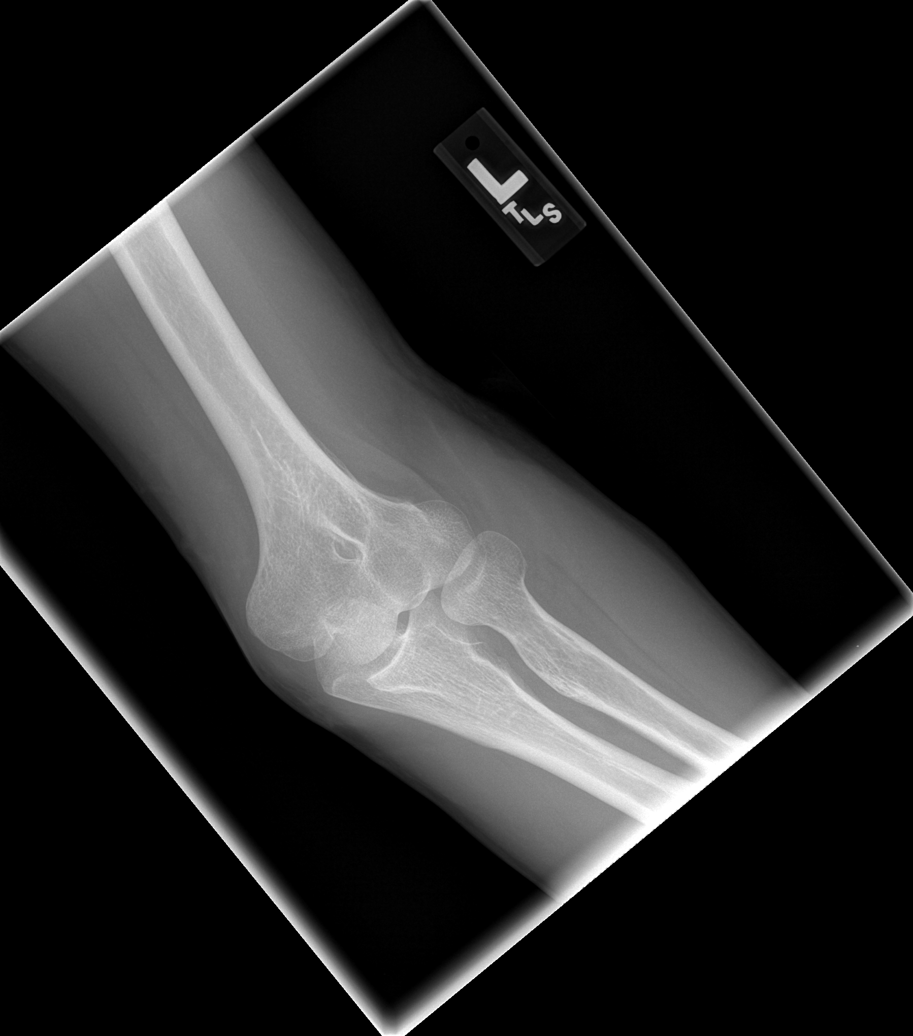

[x elbow joint obl. left (2 of 2)]
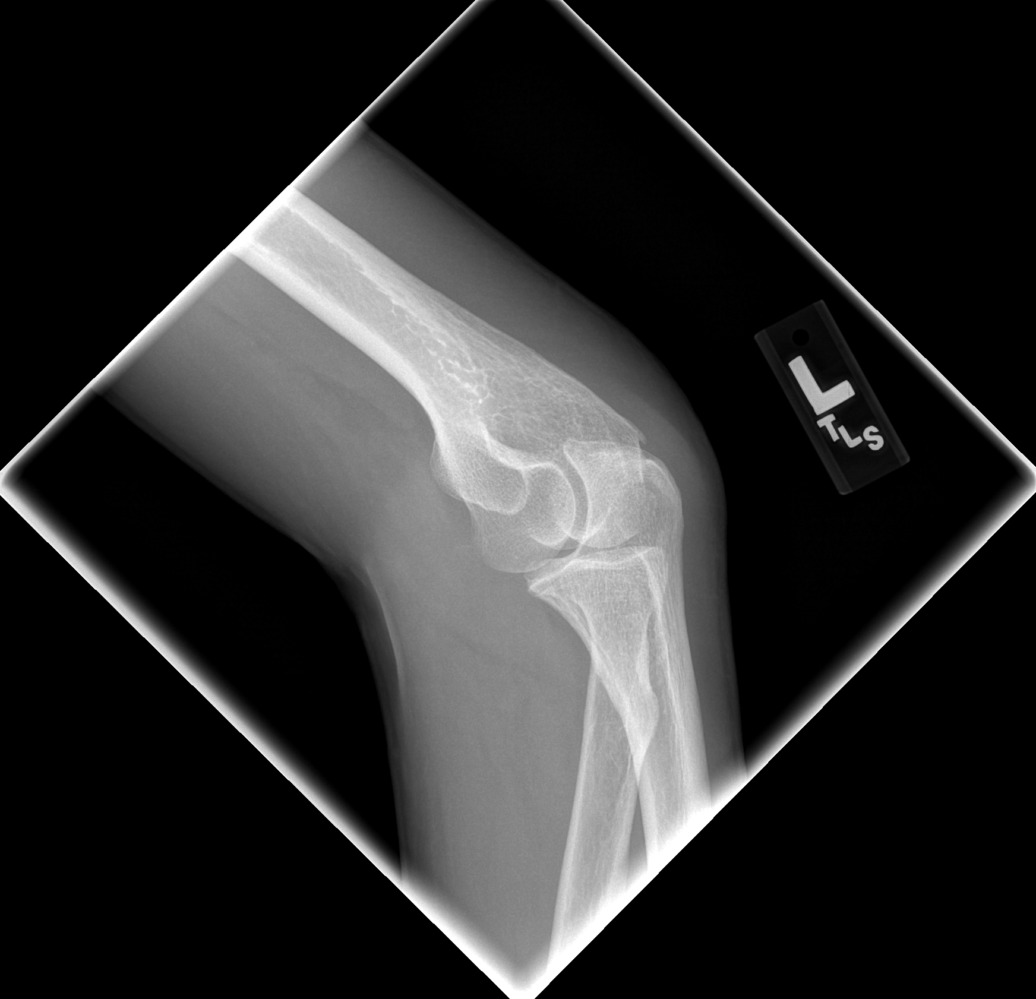

[x elbow joint lat left]
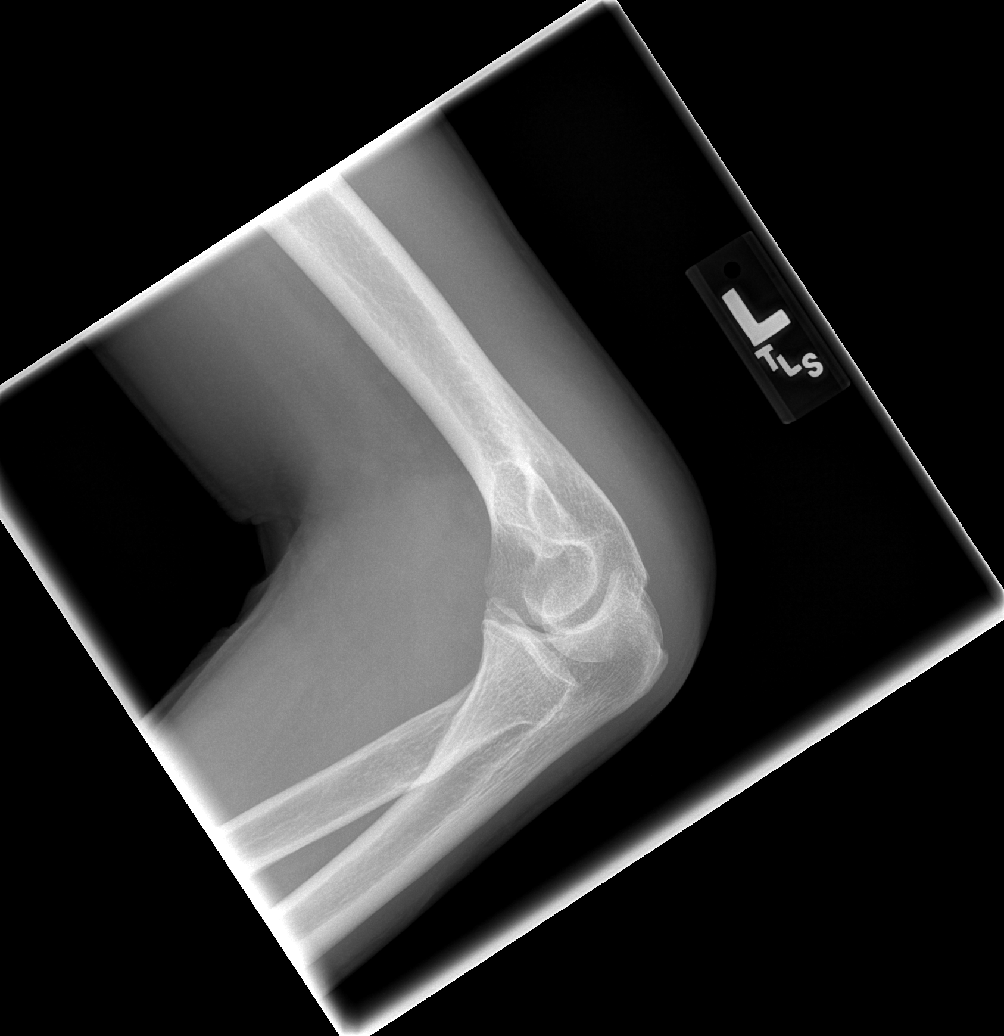

[4 of 4 positions shown; findings below may reference images not displayed]

FINDINGS: The exam is limited by nonstandard positioning.  There is no definite evidence of acute fracture or dislocation.  Posterior soft tissue swelling is noted.  No definite evidence of joint effusion.
IMPRESSION: Technically limited study.  Posterior soft tissue swelling.  No definite fracture or joint effusion.

## 2007-10-17 IMAGING — CR DG LUMBAR SPINE COMPLETE 4+V
5 series · 5 of 5 positions shown · non-contrast
Comparison: 01/28/07.

CLINICAL DATA: Fall.
 LUMBAR SPINE ? 4 VIEW:

[t l-spine a.p.]
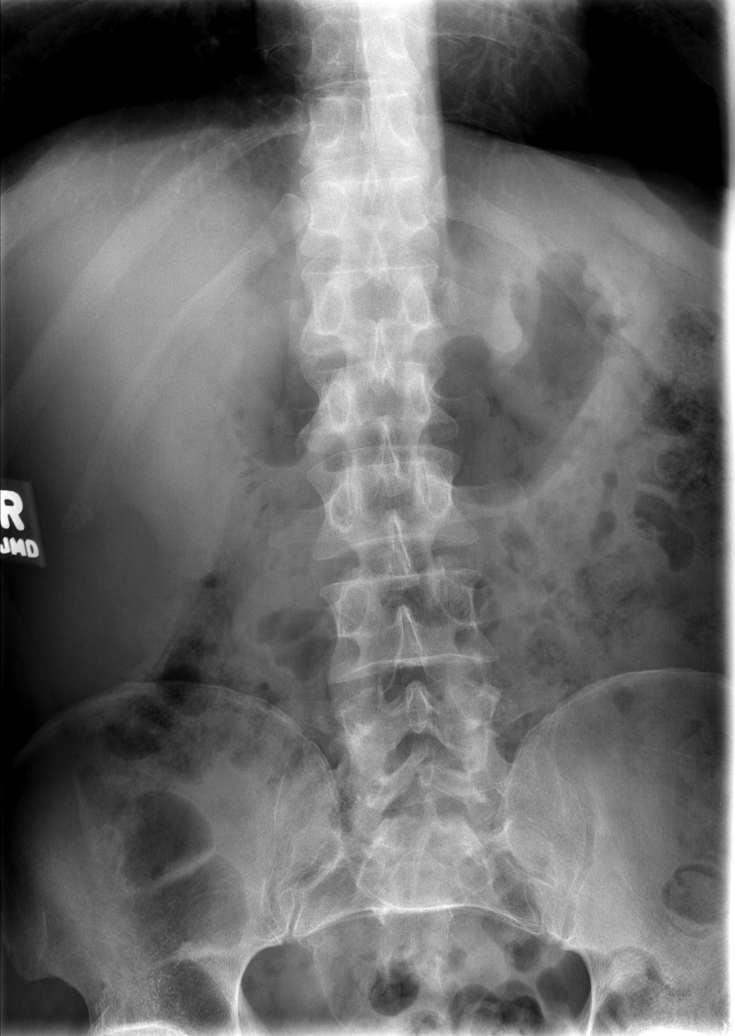

[t l-spine oblique exposure (1 of 2)]
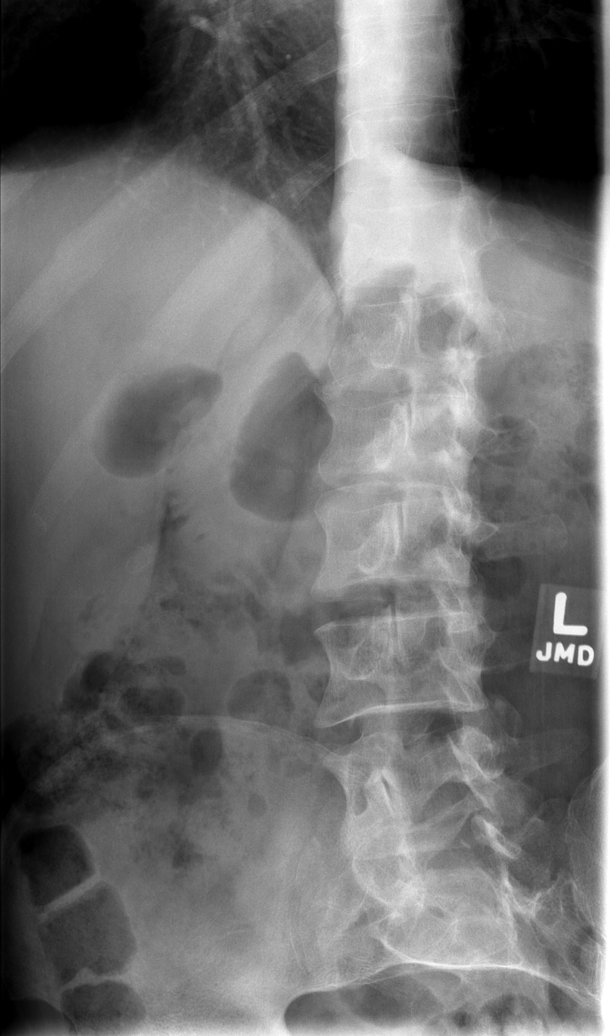

[t l-spine oblique exposure (2 of 2)]
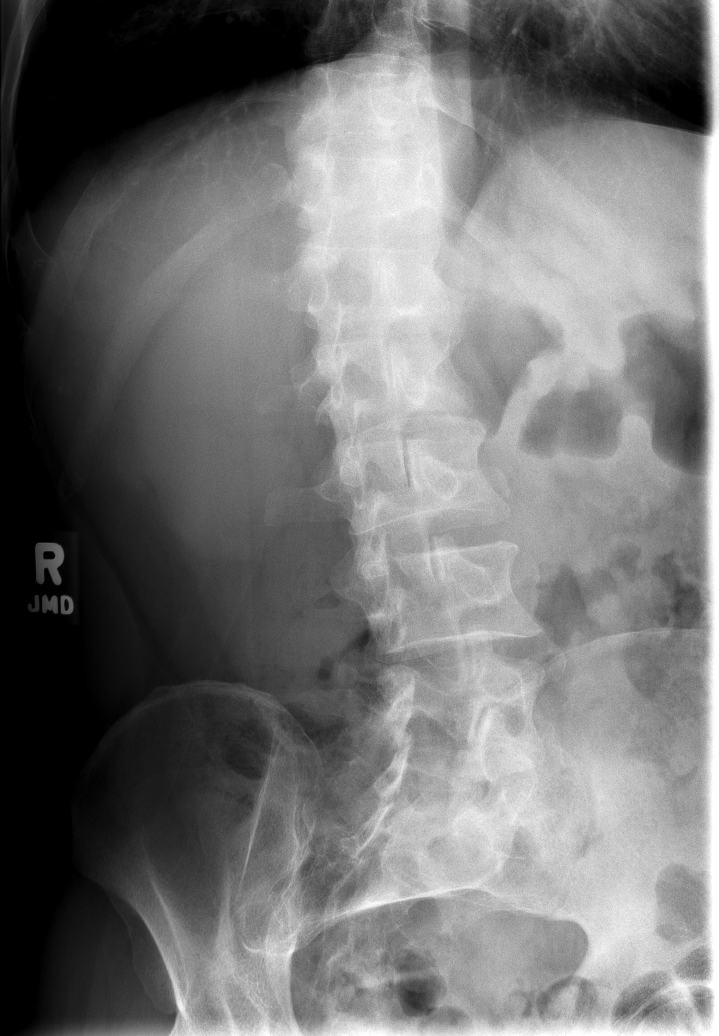

[t l-spine lat]
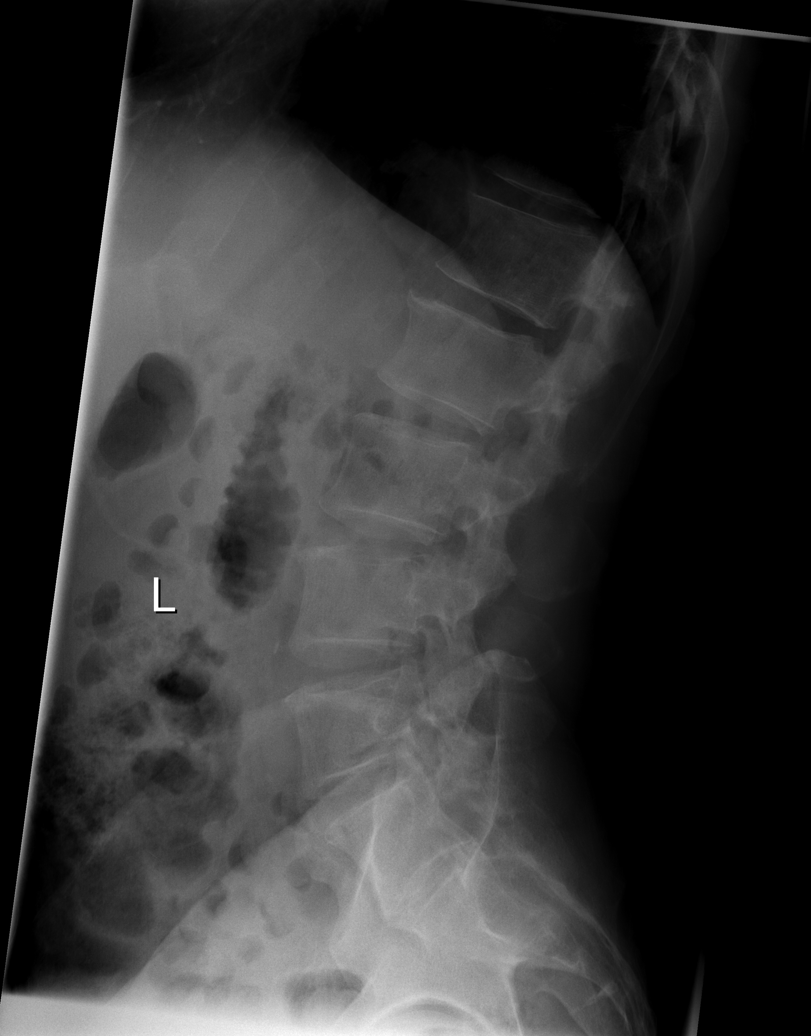

[t l-spine l5-s1 spot]
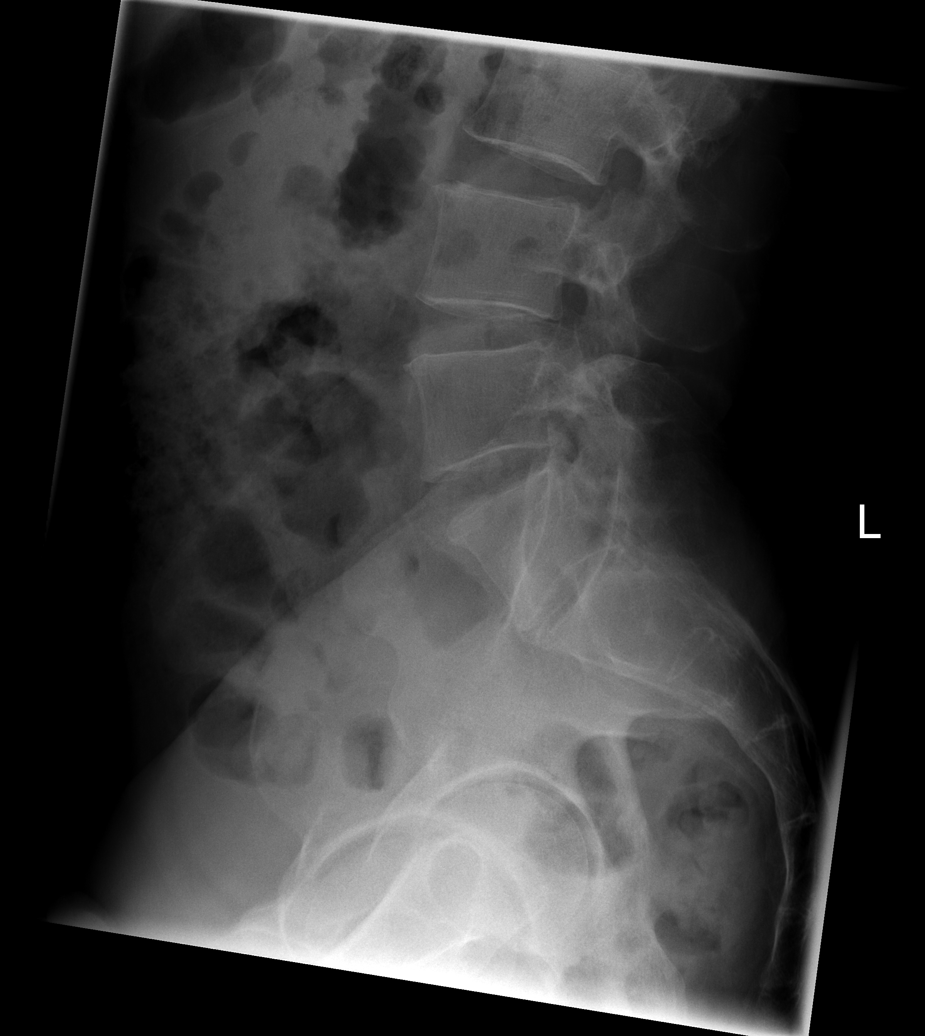

[5 of 5 positions shown; findings below may reference images not displayed]

FINDINGS: The alignment of the lumbar spine is normal. 
 The vertebral body height and the disc spaces are well preserved.  No acute fractures or dislocations are identified.
IMPRESSION: No acute findings.

## 2007-10-17 IMAGING — CR DG RIBS W/ CHEST 3+V*L*
4 series · 4 of 4 positions shown · non-contrast
Comparison: none

CLINICAL DATA: Fall

Exam: RIBS unilateral left with chest:

[w chest pa]
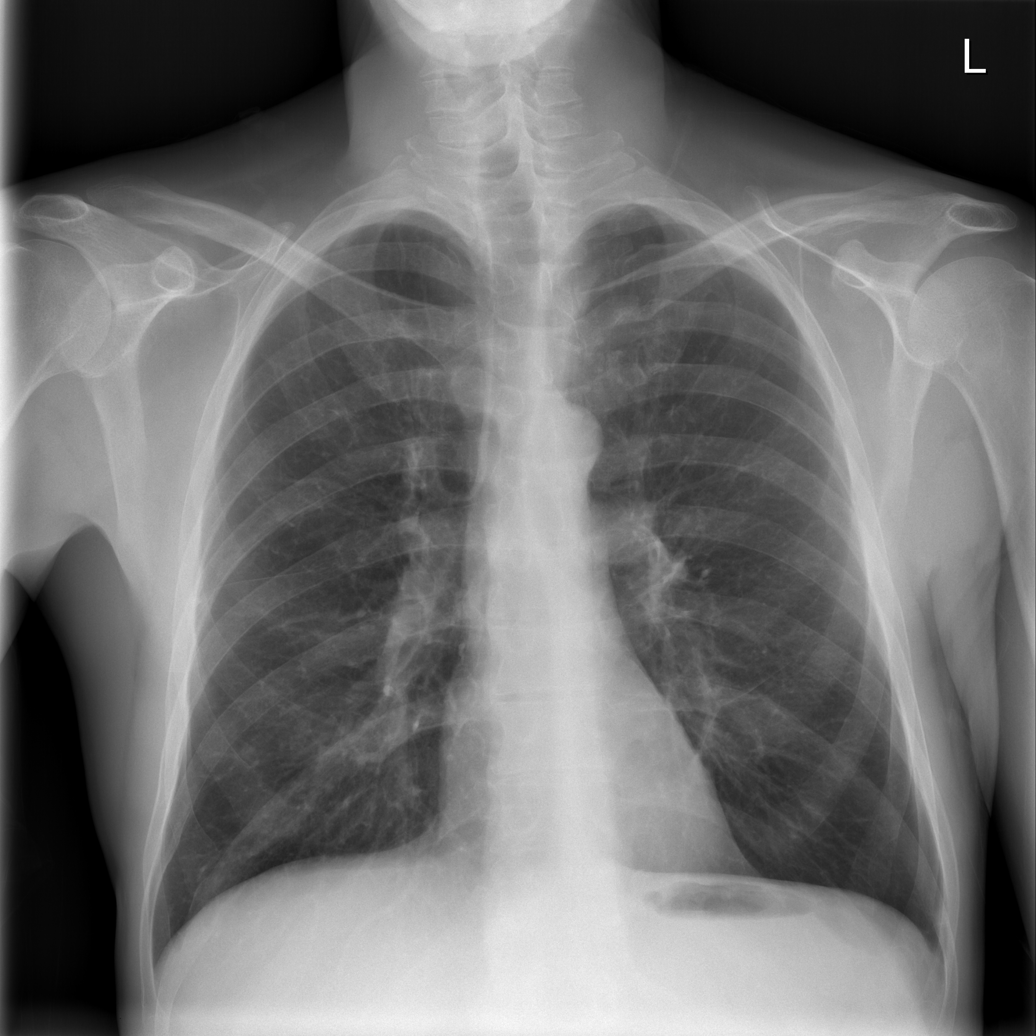

[w ribs ap/pa lower left *]
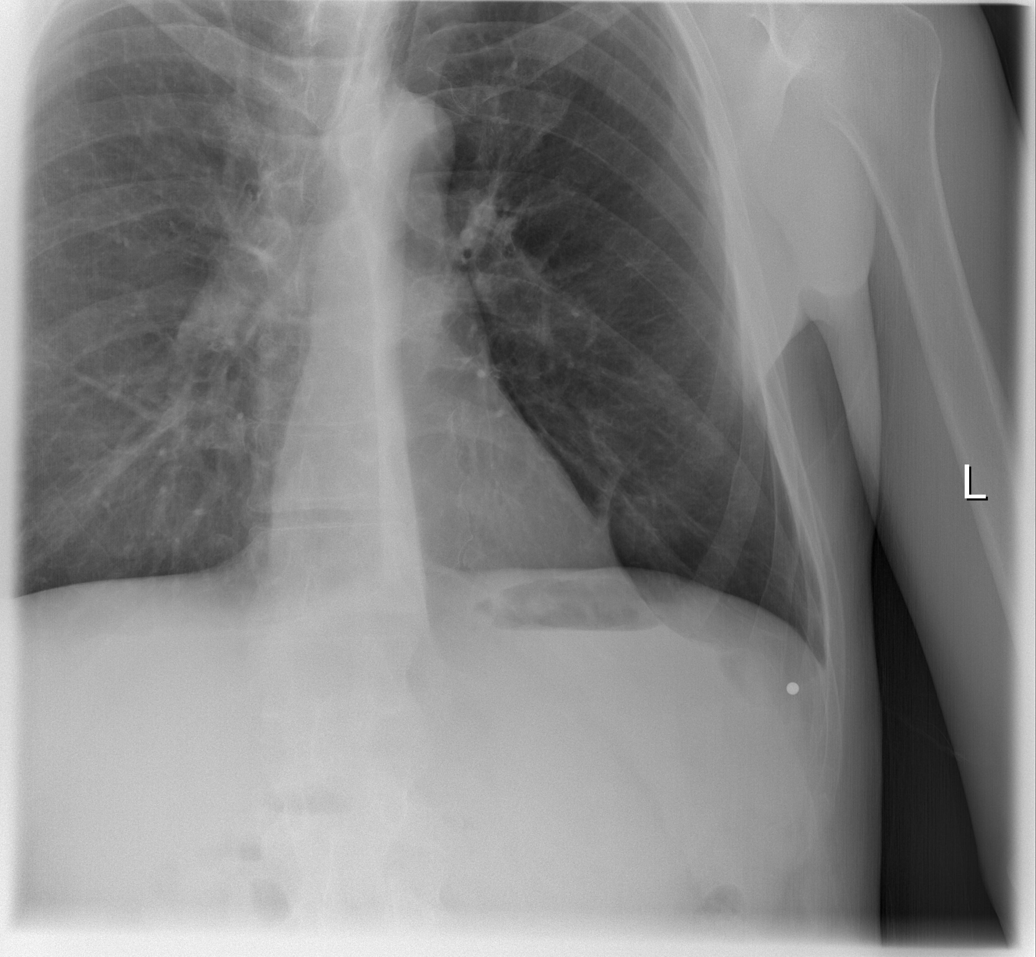

[w ribs oblique left * (1 of 2)]
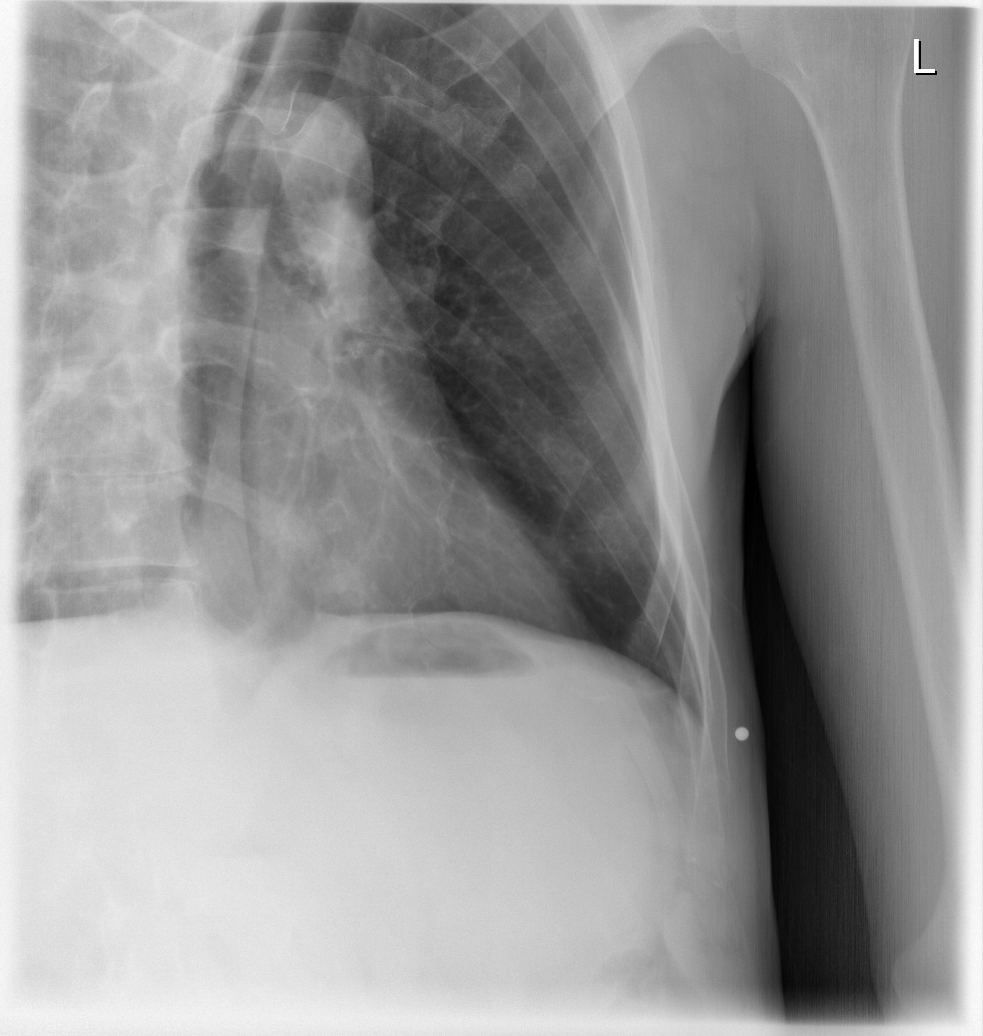

[w ribs oblique left * (2 of 2)]
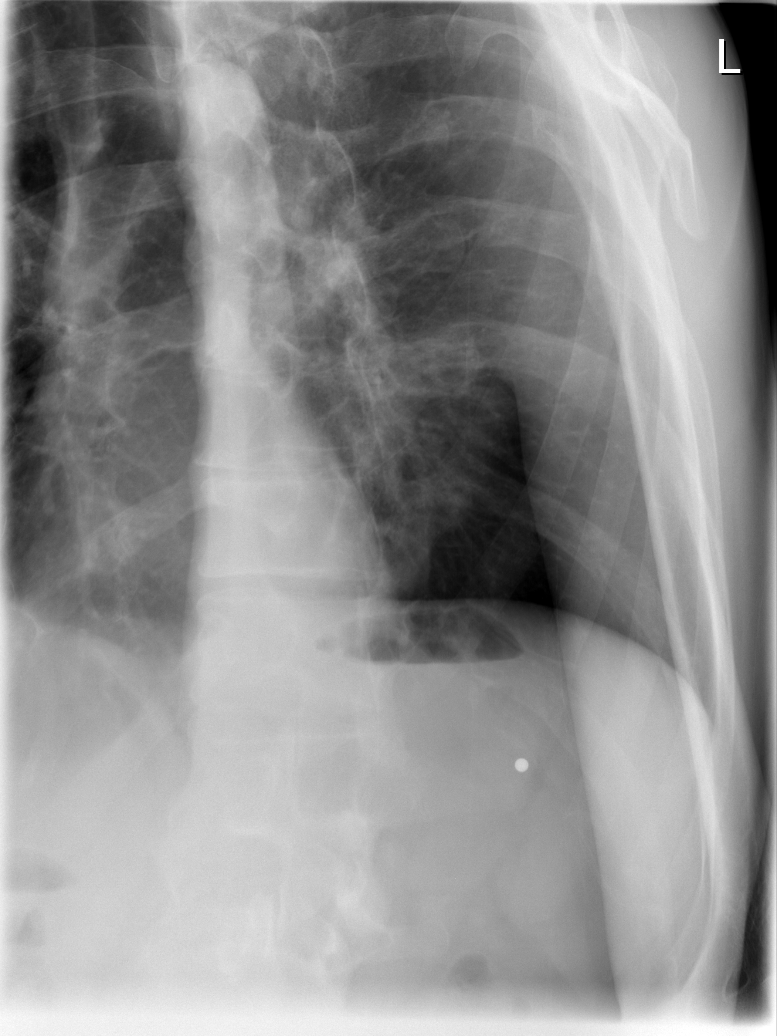

[4 of 4 positions shown; findings below may reference images not displayed]

FINDINGS: Normal mediastinum and cardiac silhouette. The lungs are
hyperinflated. Costophrenic angles are clear. No evidence of pneumothorax.
Normal pulmonary vascular. Dedicated views of the left ribs demonstrate no
evidence of fracture.
IMPRESSION: 1. No evidence of acute cardiopulmonary disease.
2. No pneumothorax.
3. No evidence of left rib fracture.

## 2008-04-17 ENCOUNTER — Emergency Department (HOSPITAL_COMMUNITY): Admission: EM | Admit: 2008-04-17 | Discharge: 2008-04-18 | Payer: Self-pay | Admitting: Emergency Medicine

## 2008-04-22 ENCOUNTER — Emergency Department (HOSPITAL_COMMUNITY): Admission: EM | Admit: 2008-04-22 | Discharge: 2008-04-22 | Payer: Self-pay | Admitting: Emergency Medicine

## 2008-04-24 ENCOUNTER — Encounter: Payer: Self-pay | Admitting: Emergency Medicine

## 2008-04-25 ENCOUNTER — Inpatient Hospital Stay (HOSPITAL_COMMUNITY): Admission: EM | Admit: 2008-04-25 | Discharge: 2008-05-05 | Payer: Self-pay | Admitting: Internal Medicine

## 2008-04-25 ENCOUNTER — Ambulatory Visit: Payer: Self-pay | Admitting: Pulmonary Disease

## 2008-05-20 ENCOUNTER — Emergency Department (HOSPITAL_COMMUNITY): Admission: EM | Admit: 2008-05-20 | Discharge: 2008-05-21 | Payer: Self-pay | Admitting: Emergency Medicine

## 2008-05-30 ENCOUNTER — Other Ambulatory Visit: Payer: Self-pay | Admitting: Emergency Medicine

## 2008-05-31 ENCOUNTER — Inpatient Hospital Stay (HOSPITAL_COMMUNITY): Admission: EM | Admit: 2008-05-31 | Discharge: 2008-06-02 | Payer: Self-pay | Admitting: Internal Medicine

## 2008-06-02 ENCOUNTER — Inpatient Hospital Stay (HOSPITAL_COMMUNITY): Admission: AD | Admit: 2008-06-02 | Discharge: 2008-06-07 | Payer: Self-pay | Admitting: Psychiatry

## 2008-06-02 ENCOUNTER — Ambulatory Visit: Payer: Self-pay | Admitting: Psychiatry

## 2008-06-07 ENCOUNTER — Inpatient Hospital Stay (HOSPITAL_COMMUNITY): Admission: EM | Admit: 2008-06-07 | Discharge: 2008-06-10 | Payer: Self-pay | Admitting: Emergency Medicine

## 2008-06-16 ENCOUNTER — Emergency Department (HOSPITAL_COMMUNITY): Admission: EM | Admit: 2008-06-16 | Discharge: 2008-06-16 | Payer: Self-pay | Admitting: Emergency Medicine

## 2008-06-18 ENCOUNTER — Emergency Department (HOSPITAL_COMMUNITY): Admission: EM | Admit: 2008-06-18 | Discharge: 2008-06-18 | Payer: Self-pay | Admitting: Emergency Medicine

## 2008-06-20 ENCOUNTER — Emergency Department (HOSPITAL_BASED_OUTPATIENT_CLINIC_OR_DEPARTMENT_OTHER): Admission: EM | Admit: 2008-06-20 | Discharge: 2008-06-21 | Payer: Self-pay | Admitting: Emergency Medicine

## 2008-06-21 ENCOUNTER — Ambulatory Visit: Payer: Self-pay | Admitting: Psychiatry

## 2008-06-21 ENCOUNTER — Inpatient Hospital Stay (HOSPITAL_COMMUNITY): Admission: AD | Admit: 2008-06-21 | Discharge: 2008-06-27 | Payer: Self-pay | Admitting: Psychiatry

## 2008-10-23 IMAGING — CR DG RIBS W/ CHEST 3+V*L*
3 series · 3 of 3 positions shown · non-contrast
Comparison: 04/11/2007

CLINICAL DATA: Fall, left anterior rib pain

LEFT RIBS AND CHEST - 3+ VIEW

[w chest pa]
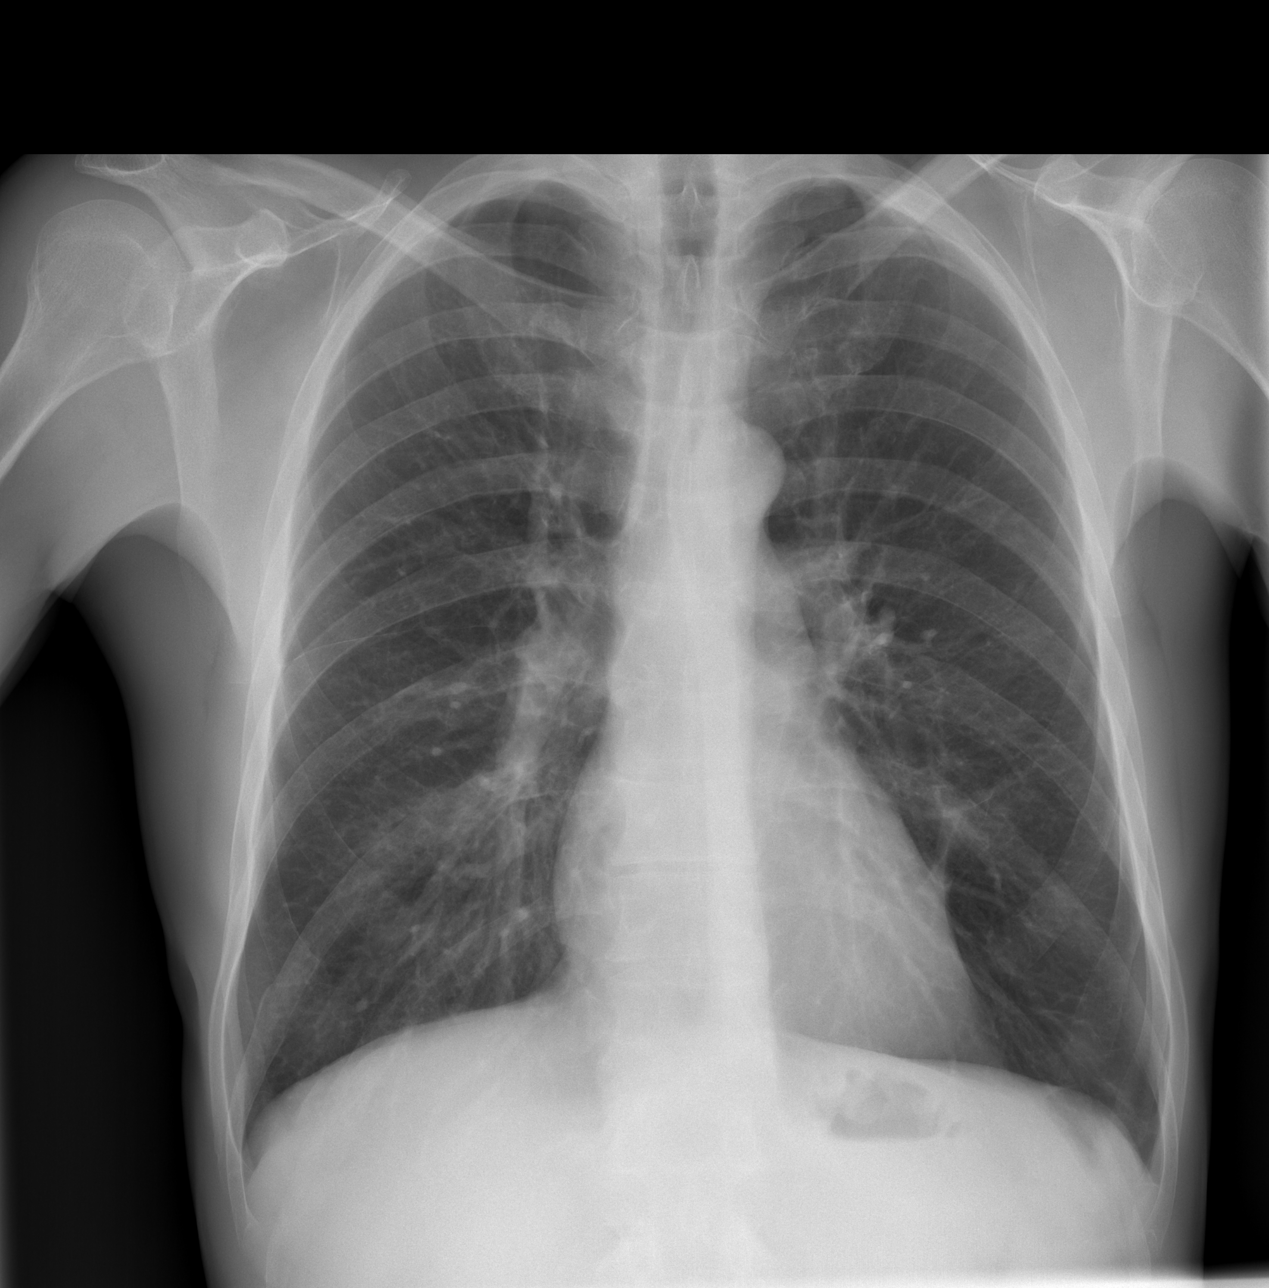

[w ribs ap/pa upper left]
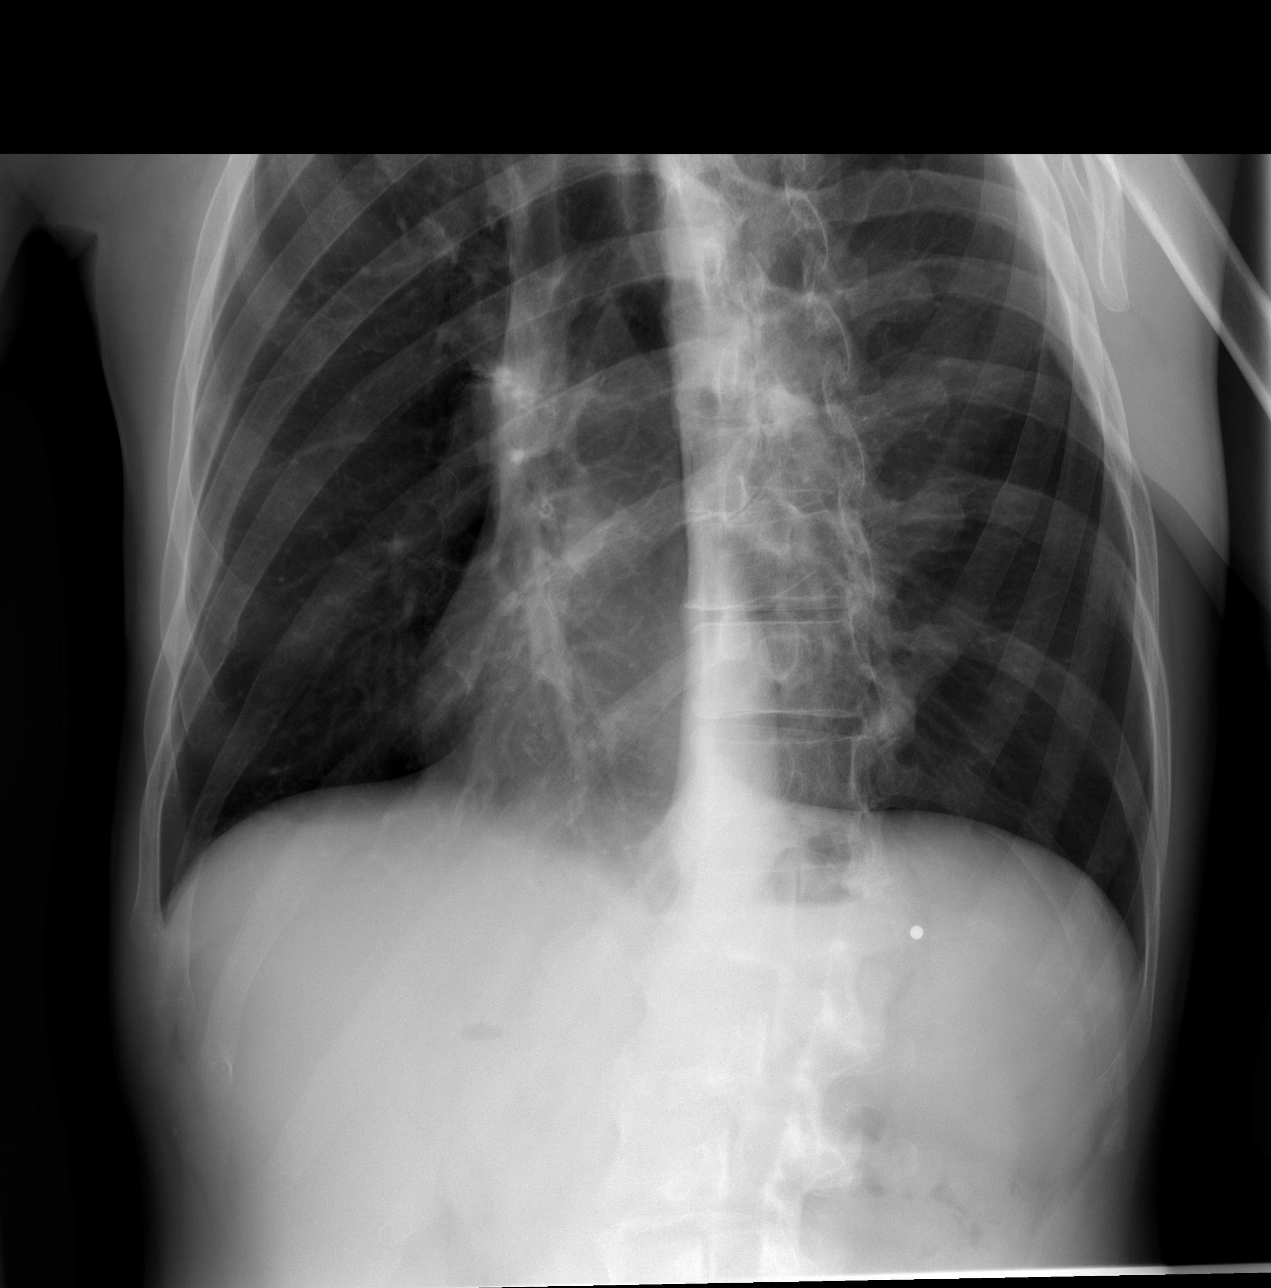

[w ribs ap/pa lower left]
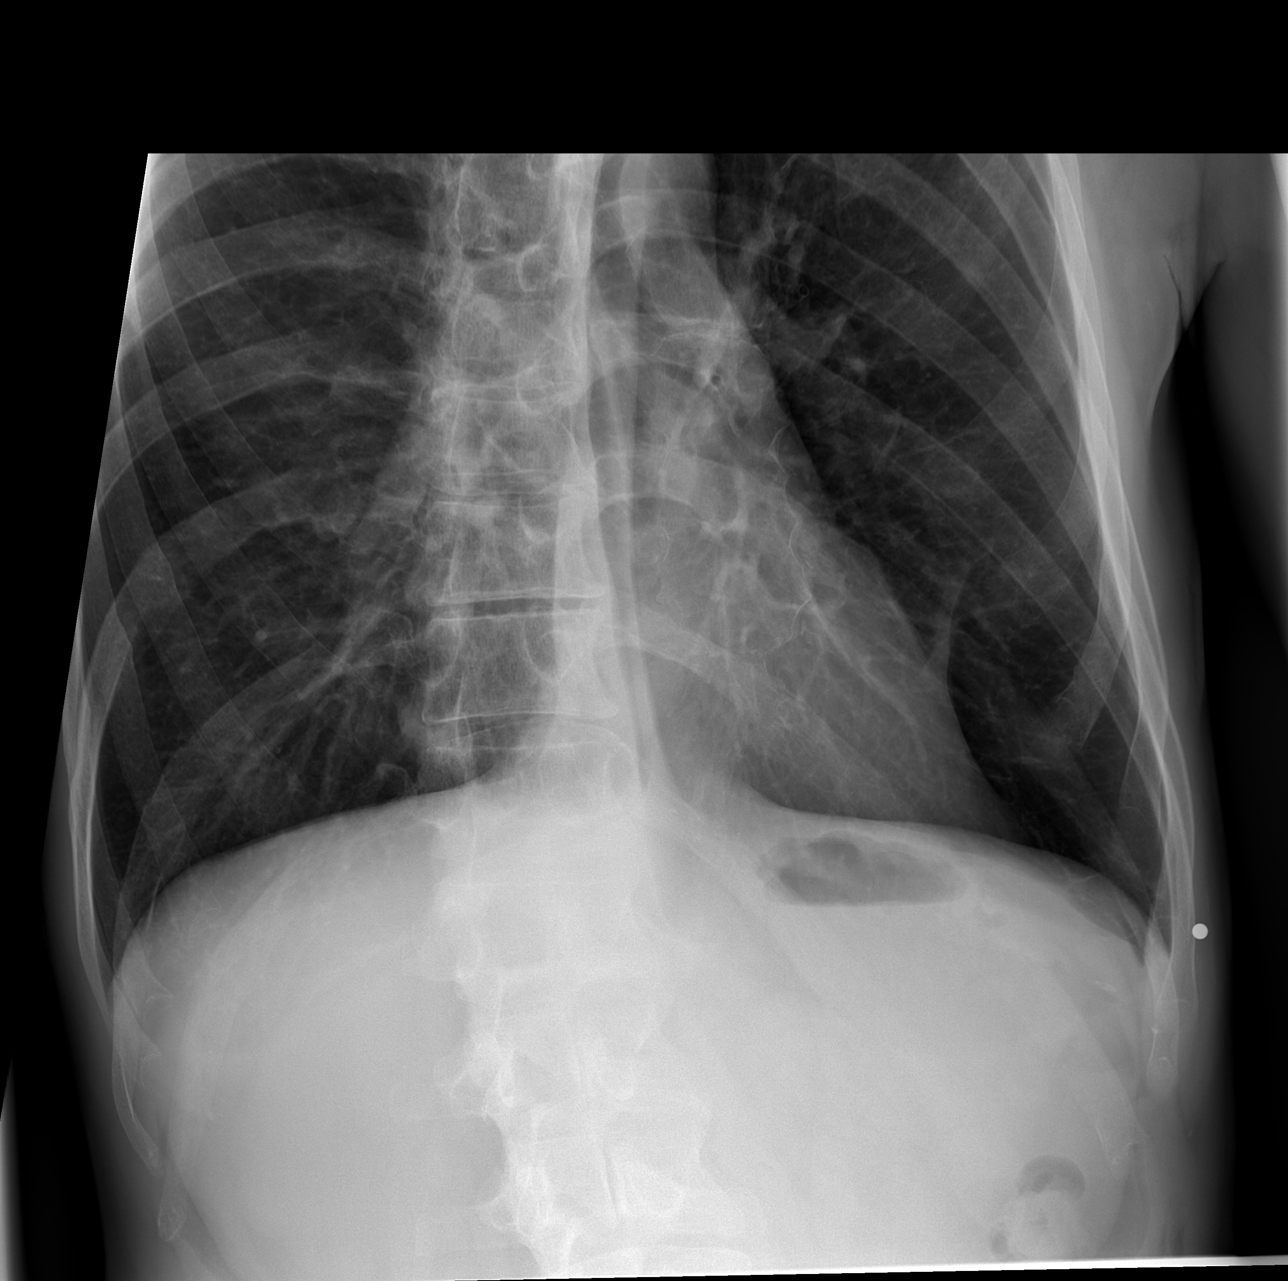

[3 of 3 positions shown; findings below may reference images not displayed]

FINDINGS: Heart size is normal.  Lungs are clear.  No displaced
left-sided rib fracture identified.  No pneumothorax.
IMPRESSION: No acute cardiopulmonary process.  No displaced left-sided rib
fracture is identified.

## 2008-10-24 IMAGING — CT CT PELVIS W/O CM
2 of 4 series · 17 of 46 positions shown, 19 images · non-contrast
Comparison: 01/28/2007

CT CHEST

CLINICAL DATA: Status post fall

CT CHEST, ABDOMEN AND PELVIS WITHOUT CONTRAST
TECHNIQUE: Multidetector CT imaging of the chest, abdomen and
pelvis was performed following the standard protocol without IV
contrast.

[Series 2: cap 5.0 b40f st · axial · 0.68mm/px · z∈[-627,-57]mm · 14 of 128 slices shown, 16 images]
[im 7/128  soft-tissue]
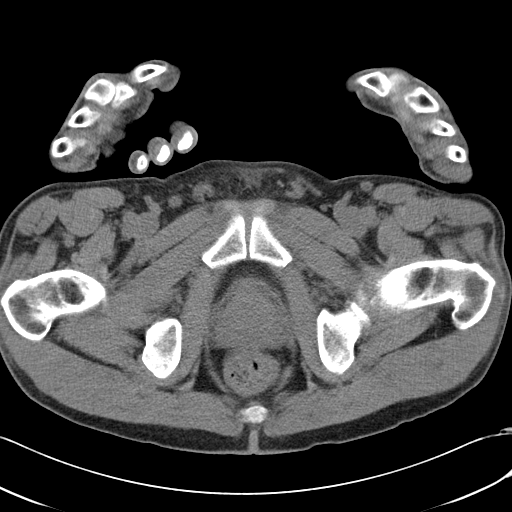
[im 7/128  bone]
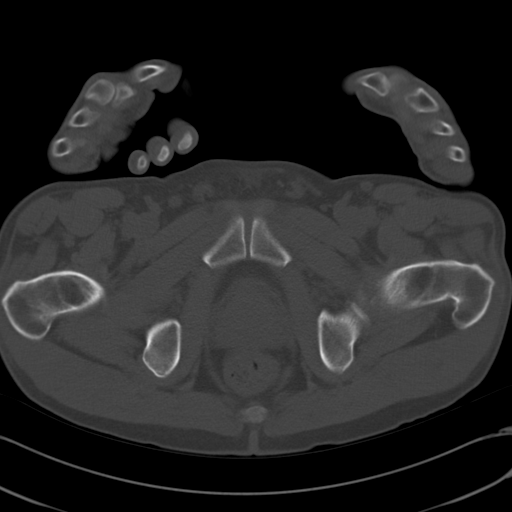
[im 20/128  soft-tissue]
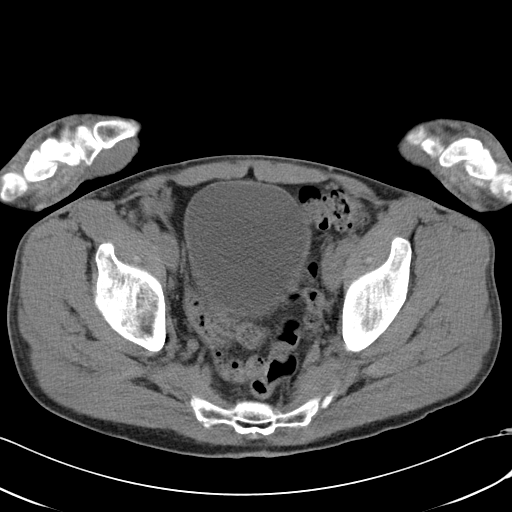
[im 26/128  soft-tissue]
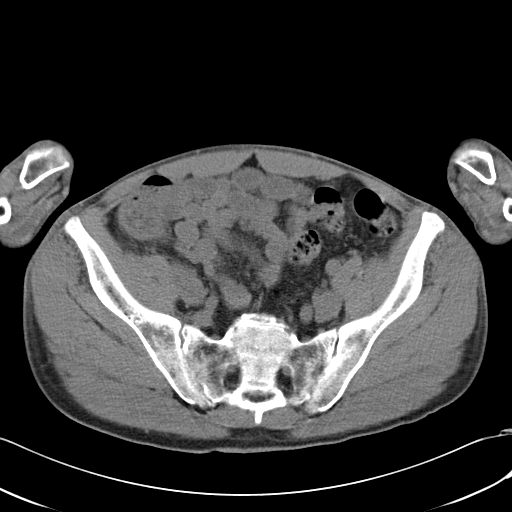
[im 32/128  soft-tissue]
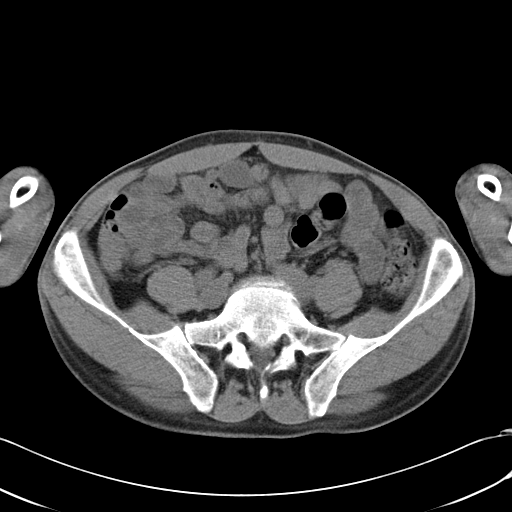
[im 45/128  soft-tissue]
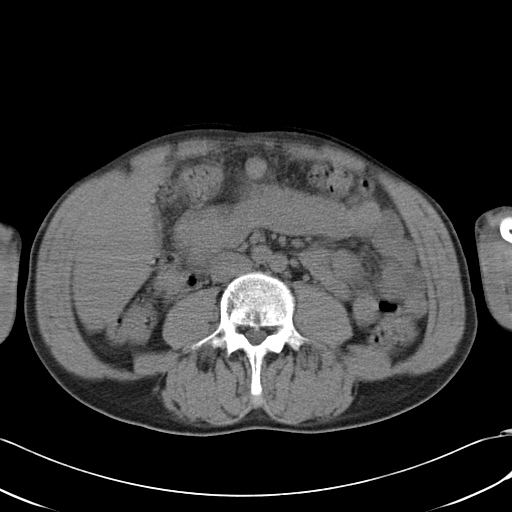
[im 51/128  soft-tissue]
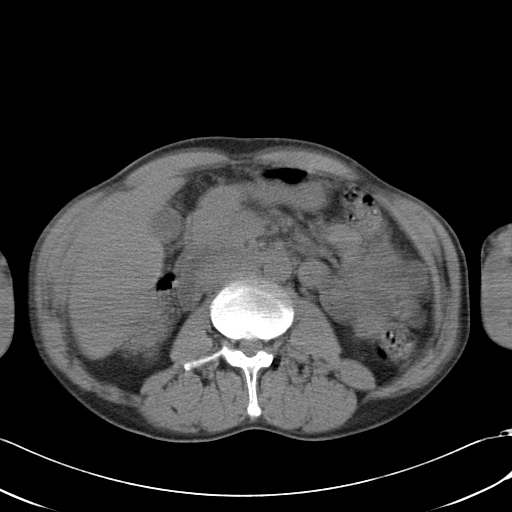
[im 58/128  soft-tissue]
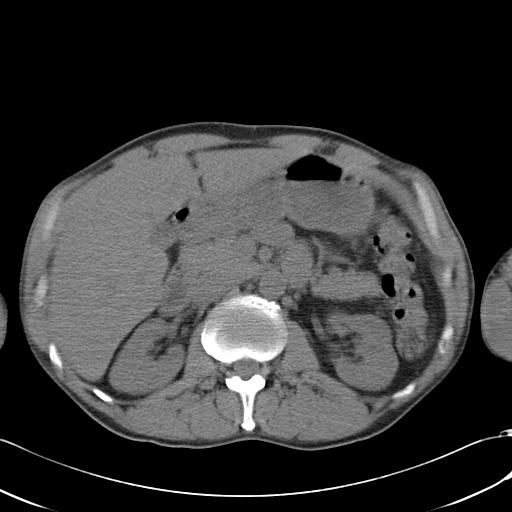
[im 70/128  soft-tissue]
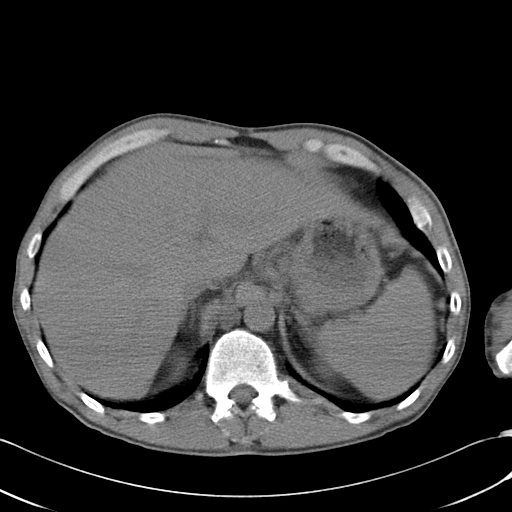
[im 77/128  soft-tissue]
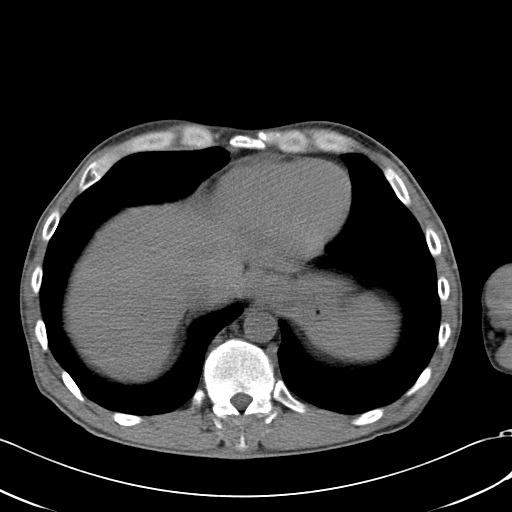
[im 77/128  bone]
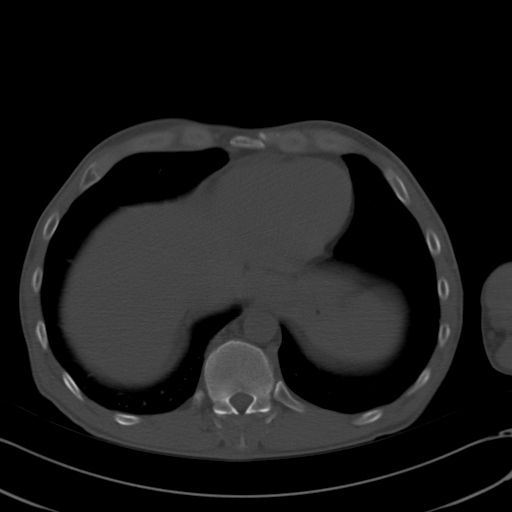
[im 83/128  soft-tissue]
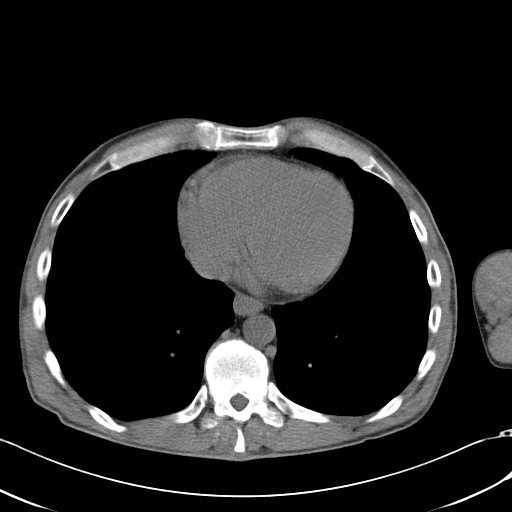
[im 96/128  soft-tissue]
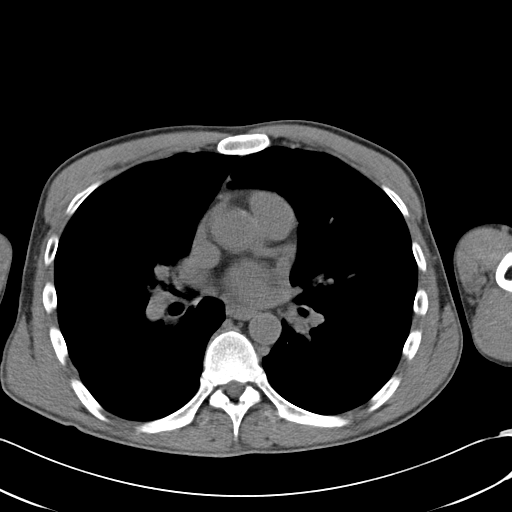
[im 102/128  soft-tissue]
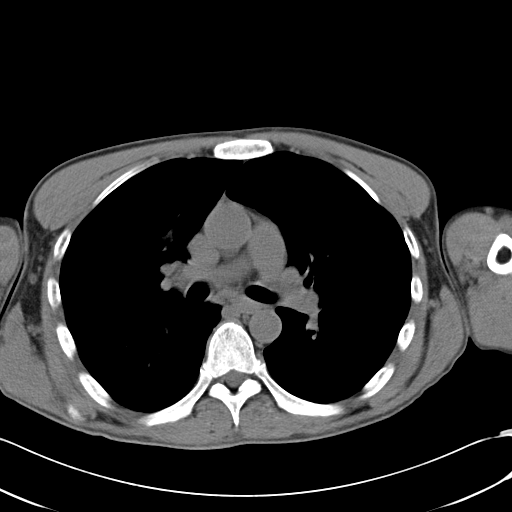
[im 108/128  soft-tissue]
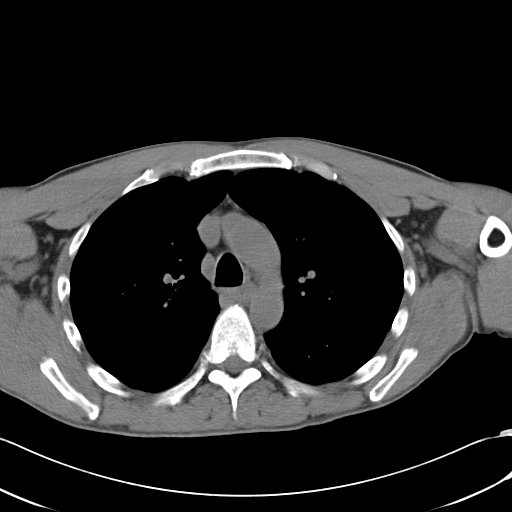
[im 121/128  soft-tissue]
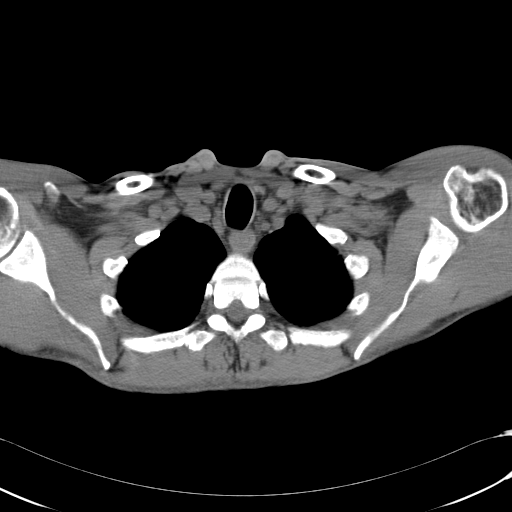

[Series 603: <mpr thick range(1)> · coronal · 1.28mm/px · 3 of 72 slices shown]
[im 24/72  soft-tissue]
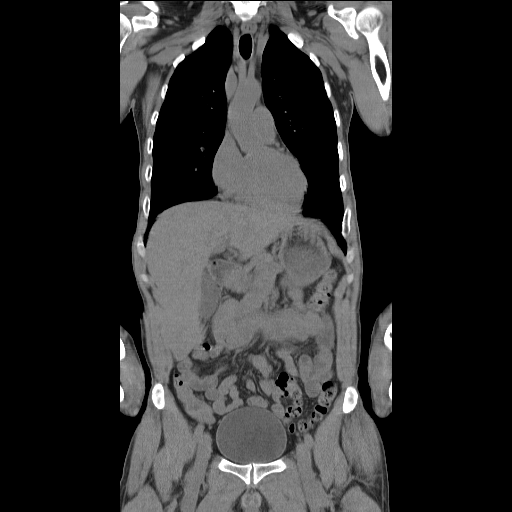
[im 32/72  soft-tissue]
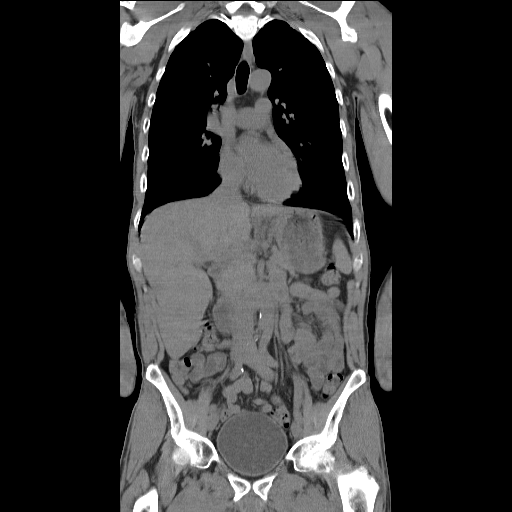
[im 40/72  soft-tissue]
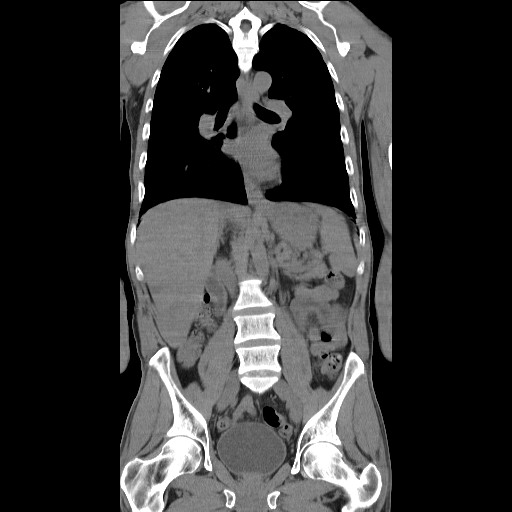

[17 of 46 positions shown; findings below may reference images not displayed]

FINDINGS: Negative for enlarged axillary or supraclavicular lymph
nodes.

Negative for mediastinal or hilar lymph nodes.

Negative for pericardial or pleural fluid.

No evidence for mediastinal hematoma or pleural effusion.

Scarring is identified within the lingular portion of the left
lung.

There is no evidence for pulmonary contusion or pneumothorax.

Review of the visualized osseous structures shows no acute
findings.
IMPRESSION: 1.  No acute findings.

CT ABDOMEN
FINDINGS: The liver is negative.

The spleen is negative.

Both adrenal glands are negative.

The pancreas is negative.

The right kidney and the left kidney are both negative.

There is no enlarged retroperitoneal or small bowel mesenteric
adenopathy.

There is no free fluid, or abnormal fluid collections within the
upper abdomen.

Review of the visualized osseous structures shows no acute
findings.
IMPRESSION: 1.  No acute upper abdominal CT findings.

CT PELVIS
FINDINGS: There is no free fluid or abnormal fluid collections.

The urinary bladder is negative.

There is no enlarged pelvic or inguinal lymph nodes.

The review of visualized osseous structures is unremarkable.
IMPRESSION: 1.  No acute pelvic CT findings.

## 2008-10-28 IMAGING — CR DG CHEST 2V
2 series · 2 of 2 positions shown · non-contrast
Comparison: 04/17/2008

CLINICAL DATA: Fall from a ladder.  Left rib pain.

CHEST - 2 VIEW

[view not recorded (1 of 2)]
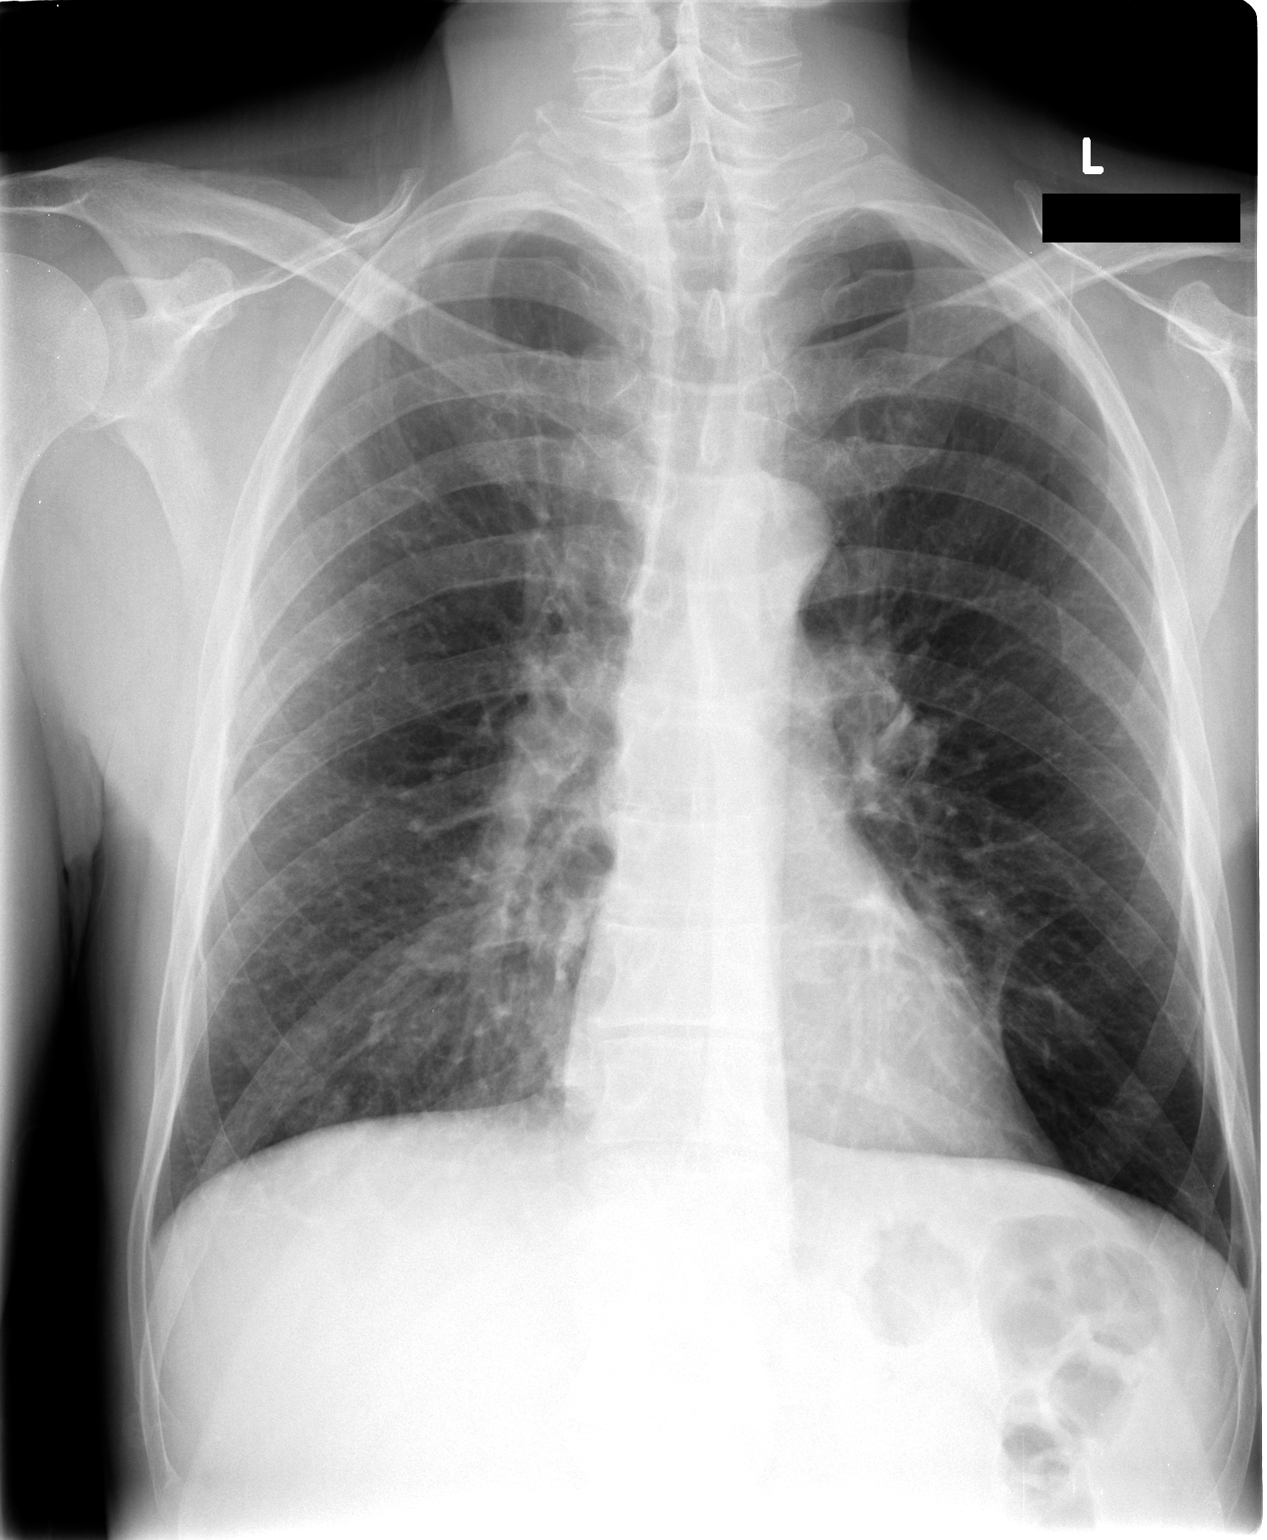

[view not recorded (2 of 2)]
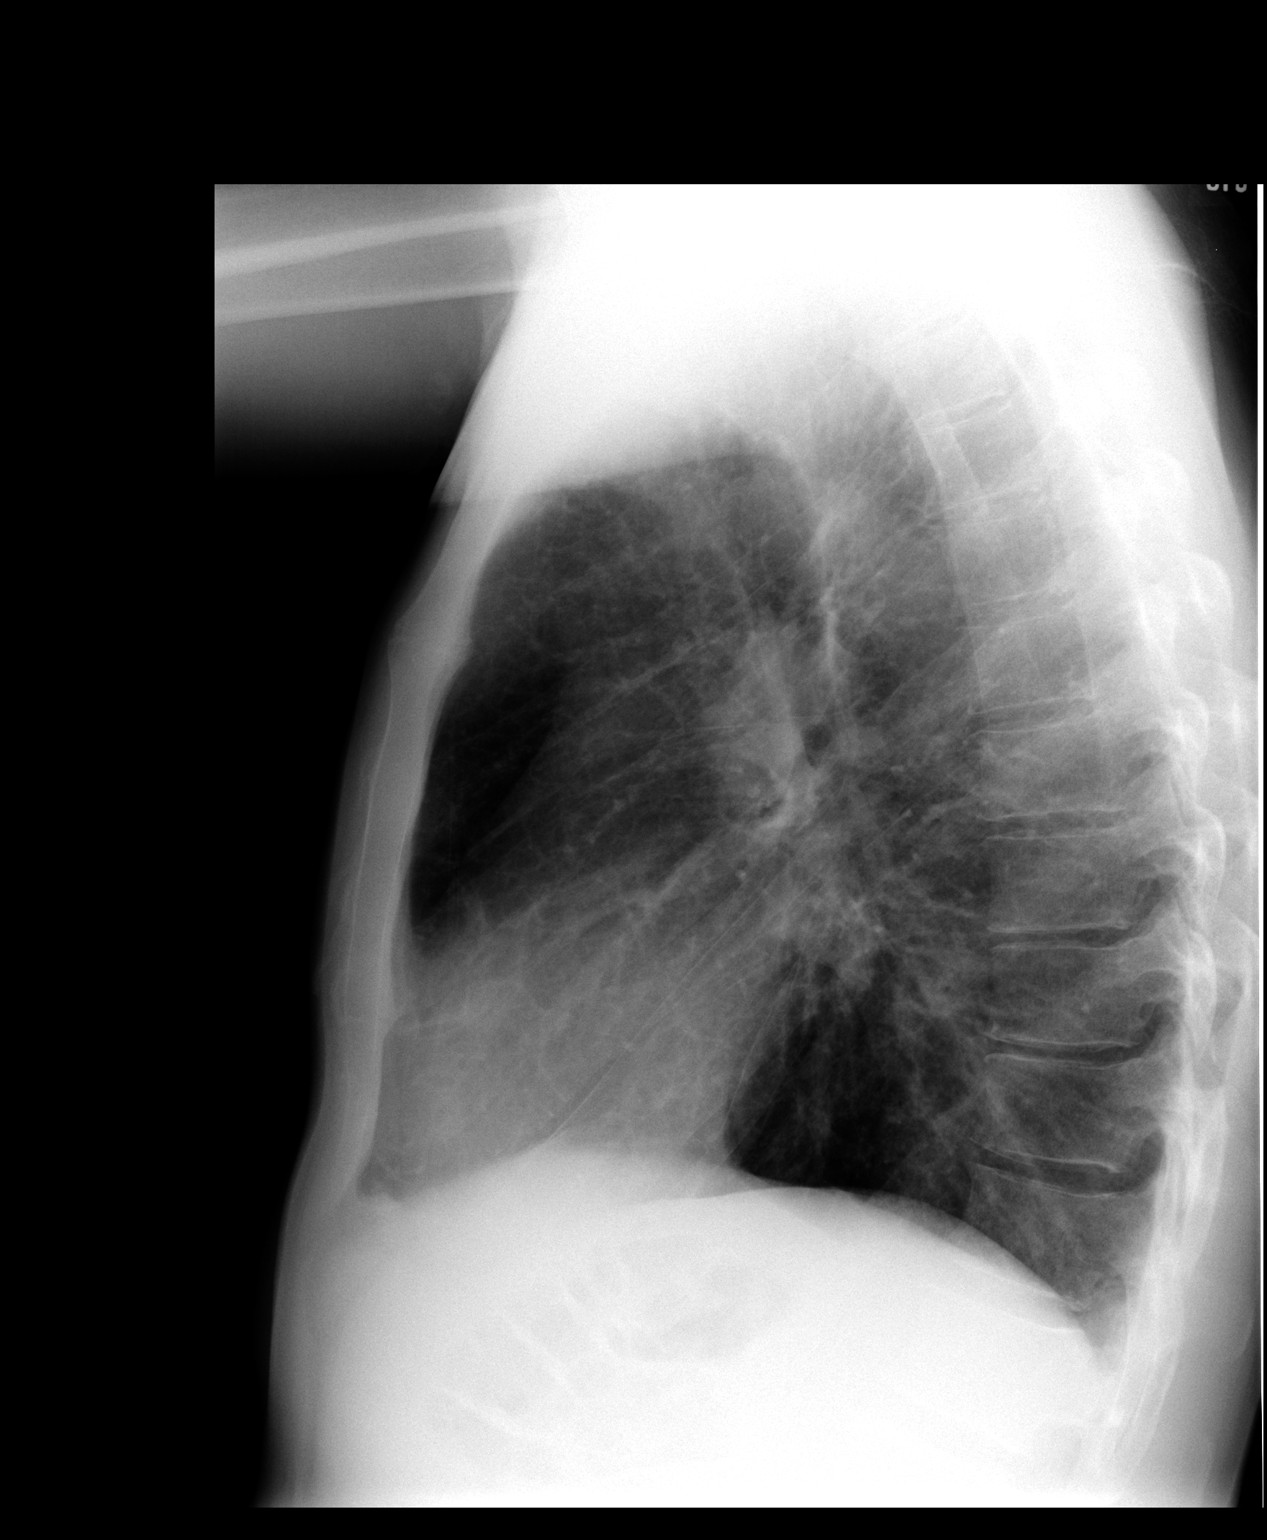

[2 of 2 positions shown; findings below may reference images not displayed]

FINDINGS: Mild lingular scarring is again noted.  No pulmonary
contusion or definite rib fracture is identified.  Cardiac and
mediastinal contours appear normal.

A small chronic bony bridge between the left first and second rib
accounts for the vague nodularity in this region.
IMPRESSION: 1.  Mild lingular scarring.

## 2008-10-30 IMAGING — CR DG CHEST 2V
2 series · 2 of 2 positions shown · non-contrast
Comparison: 04/22/2008

CLINICAL DATA: Left chest pain, shortness of breath, wheezing,
nausea, vomiting, medical clearance

CHEST - 2 VIEW

[w chest pa]
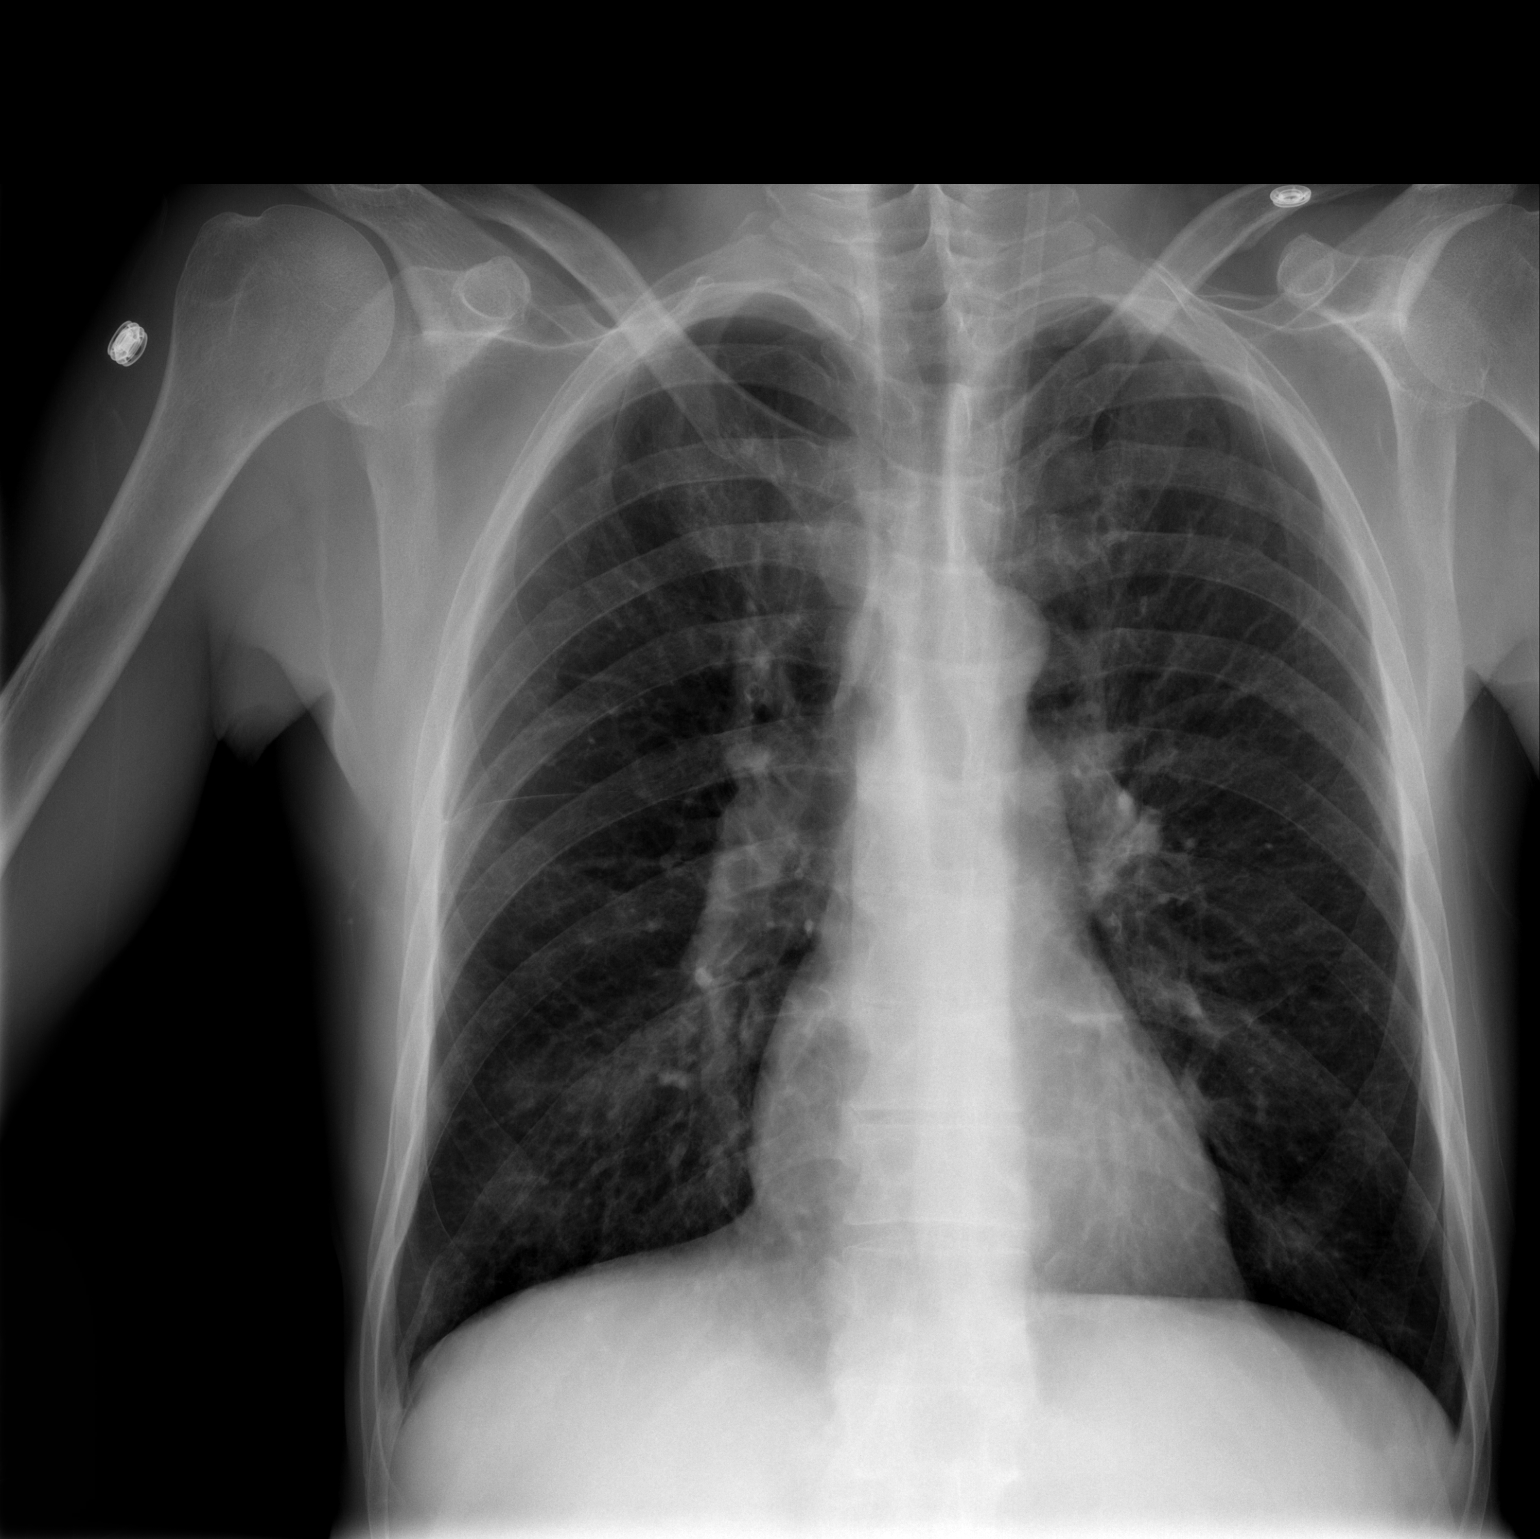

[w chest lat]
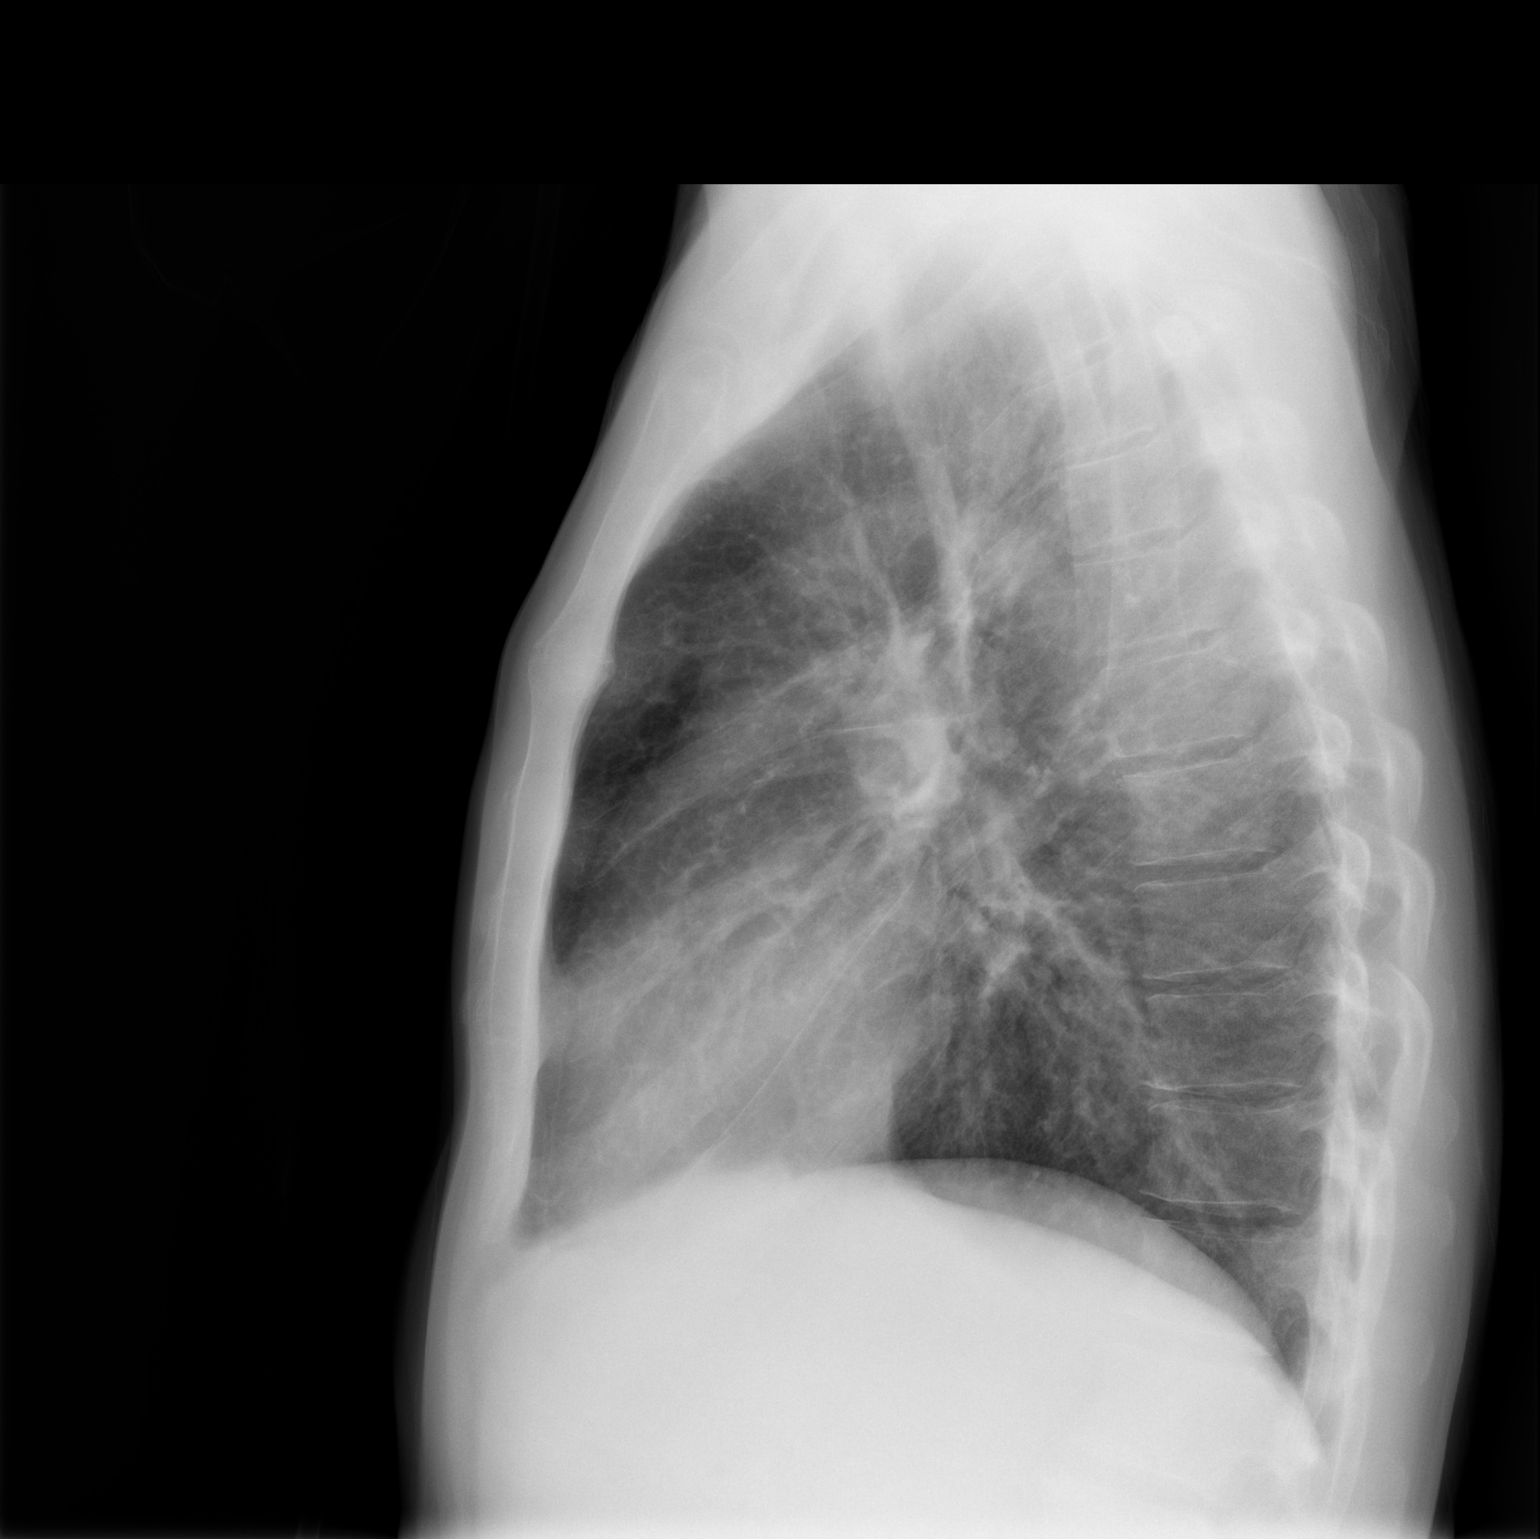

[2 of 2 positions shown; findings below may reference images not displayed]

FINDINGS: Normal heart size, mediastinal contours, and pulmonary vascularity.
Mild peribronchial thickening.
No pulmonary infiltrate or pleural effusion.
Question right nipple shadow.
Bones unremarkable.
IMPRESSION: Bronchitic changes with question right nipple shadow.
Repeat PA chest radiograph with nipple markers recommended.

## 2008-10-31 IMAGING — US US RENAL
1 series · 14 of 24 positions shown · non-contrast
Comparison: CT 04/25/2008

CLINICAL DATA: COPD.  Nausea vomiting.  Hydronephrosis.  Decreased
urine output.

RENAL/URINARY TRACT ULTRASOUND
TECHNIQUE: Complete ultrasound examination of the urinary tract
was performed including evaluation of the kidneys, renal collecting
systems and urinary bladder.

[Series 1: unknown · 0.37mm/px · 14 of 24 slices shown]
[im 1/24]
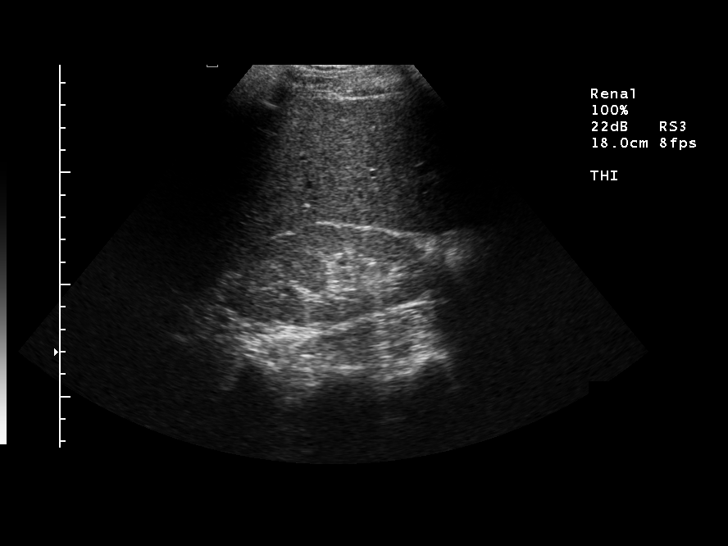
[im 3/24]
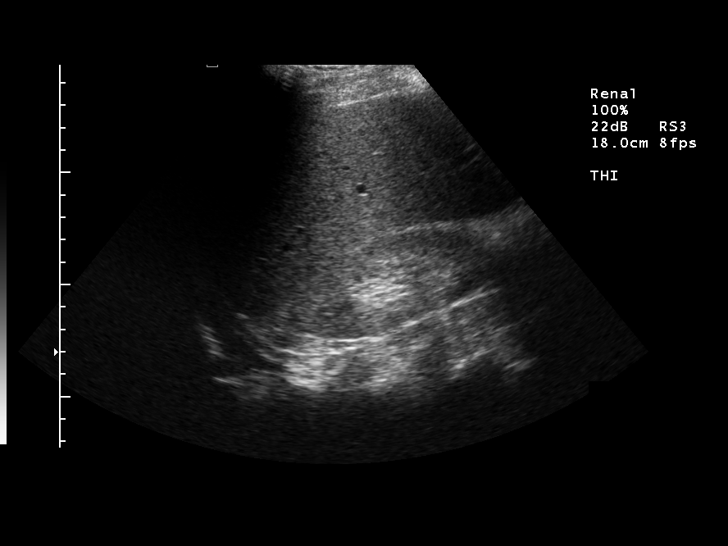
[im 5/24]
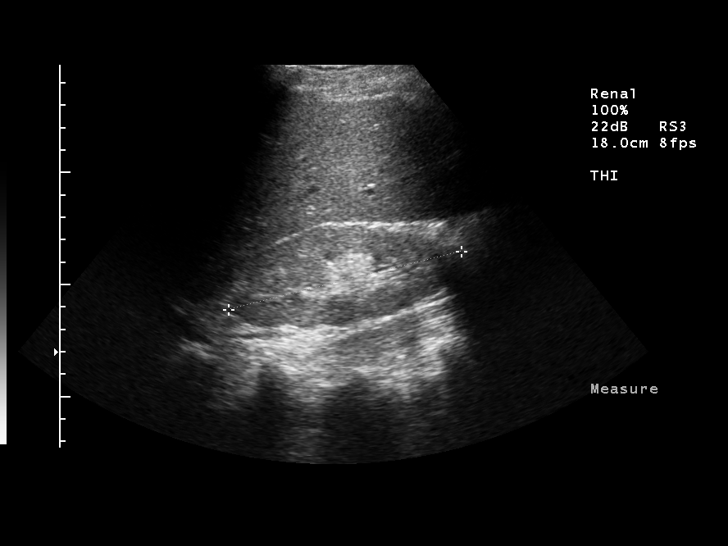
[im 7/24]
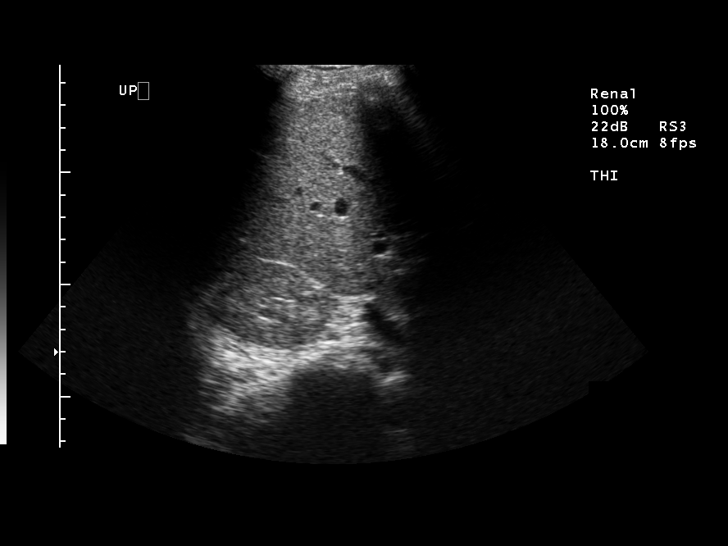
[im 8/24]
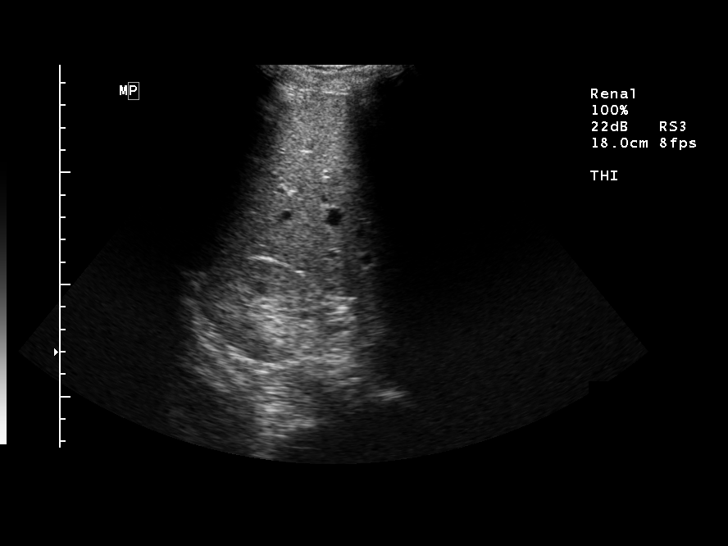
[im 10/24]
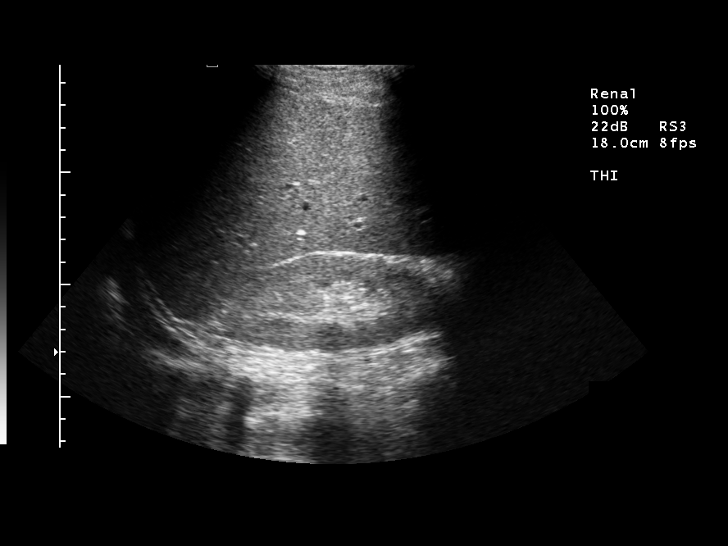
[im 12/24]
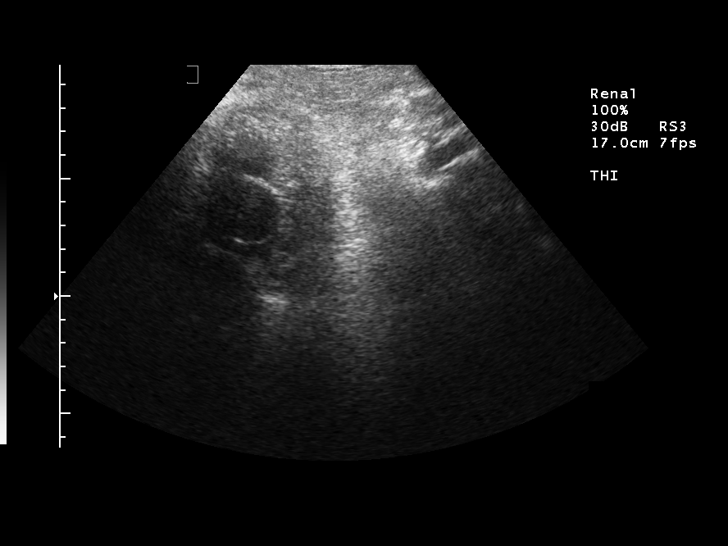
[im 13/24]
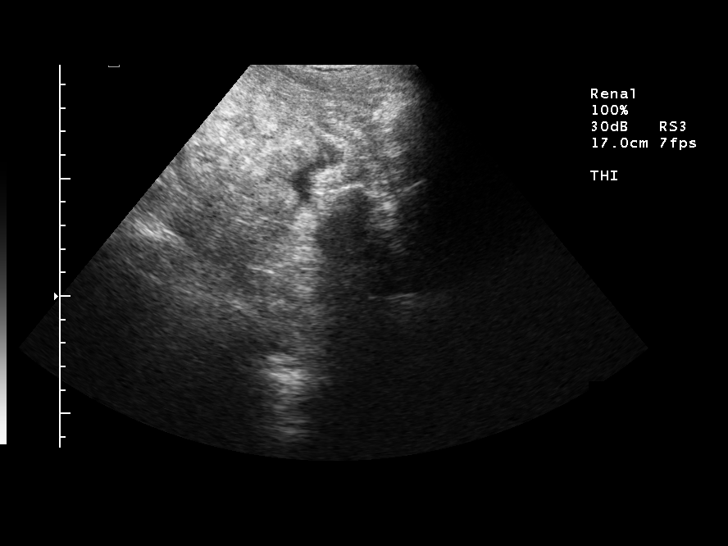
[im 15/24]
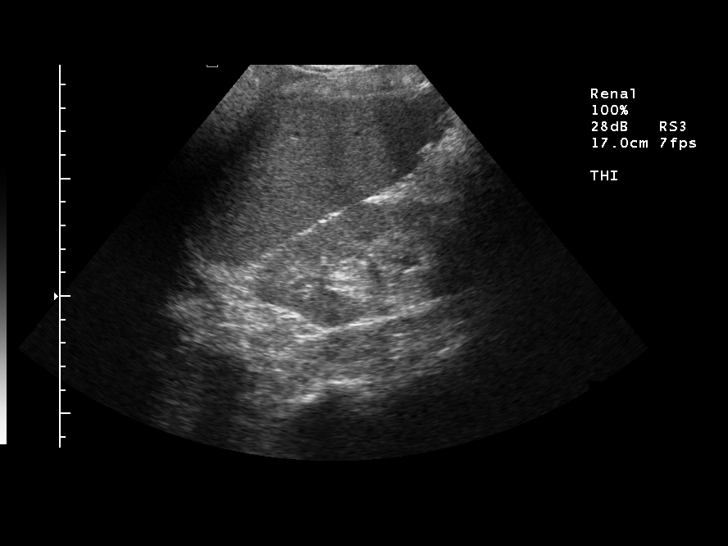
[im 17/24]
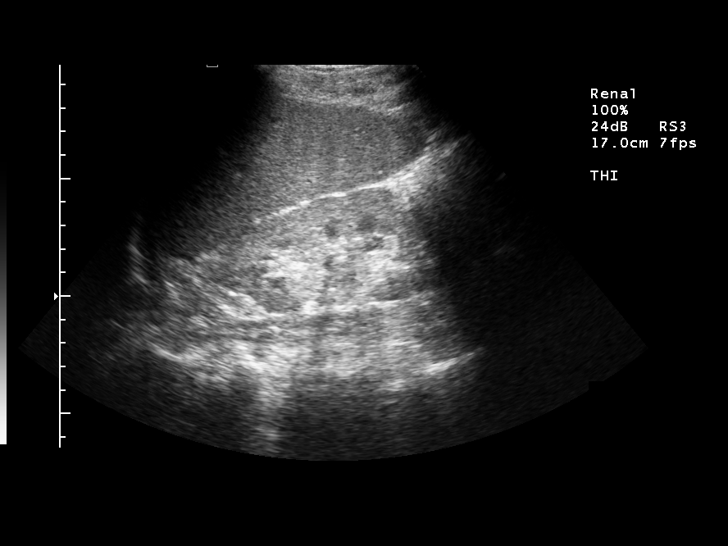
[im 19/24]
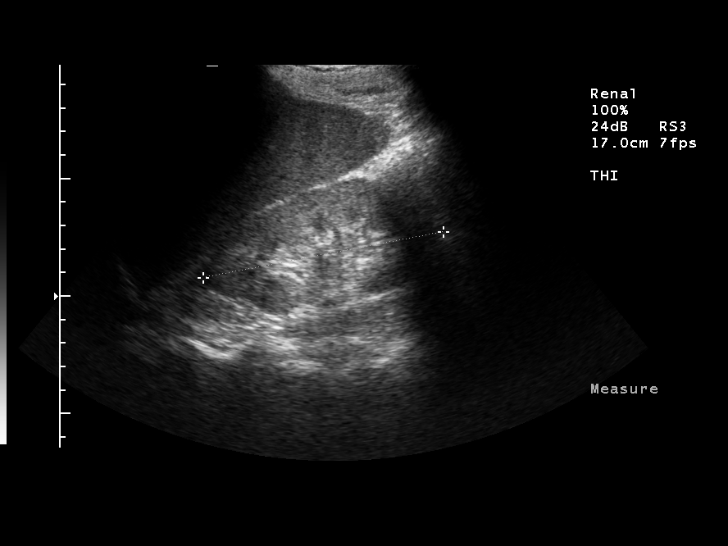
[im 20/24]
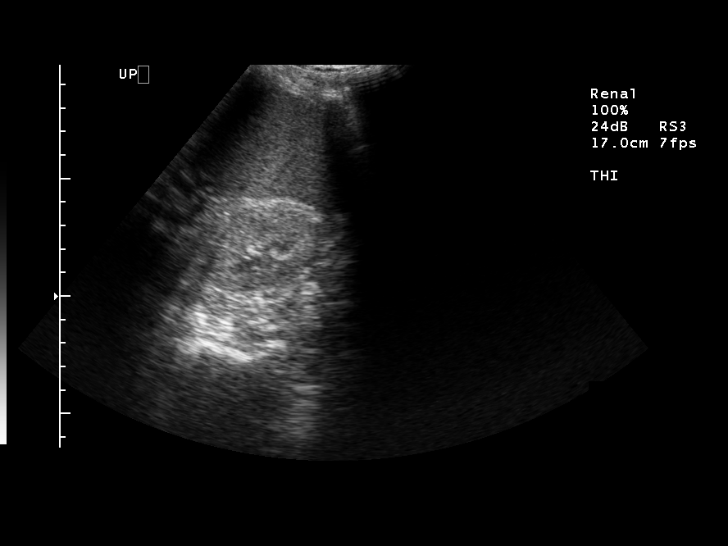
[im 22/24]
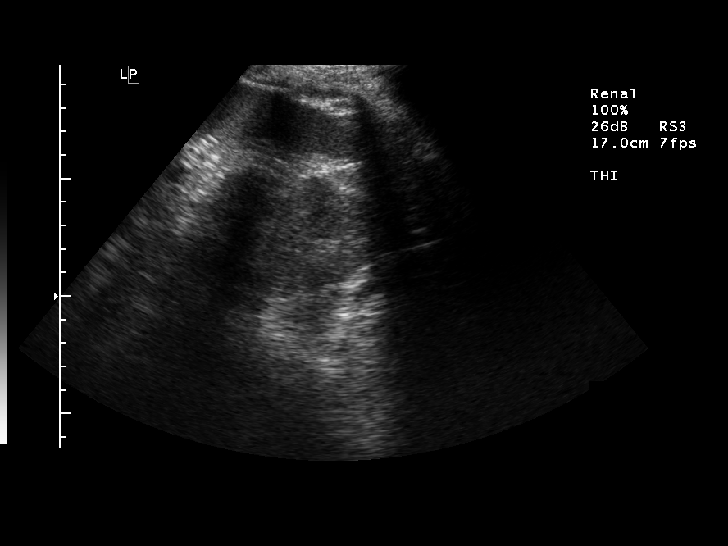
[im 24/24]
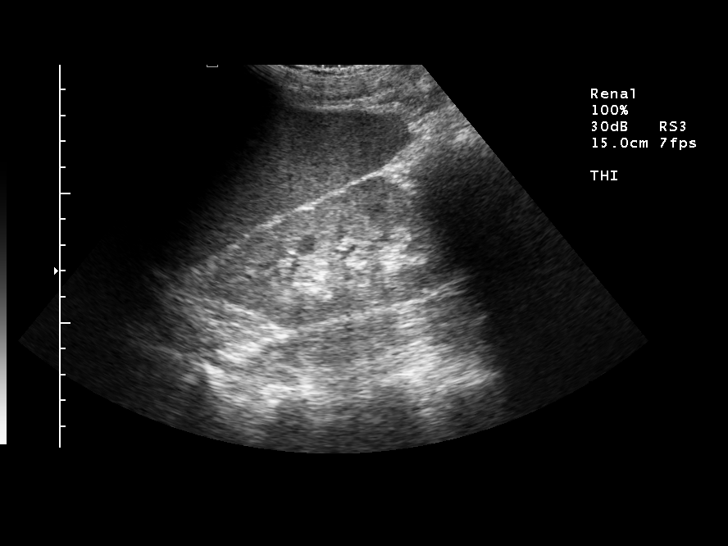

[14 of 24 positions shown; findings below may reference images not displayed]

FINDINGS: The right kidney is 10.7 cm in length.  Left kidney is
10.4 cm in length.  No hydronephrosis is identified.  No renal
masses are identified.  Renal echogenicity is increased
bilaterally.  Bladder contains a Foley catheter.
IMPRESSION: Increased renal echogenicity bilaterally.  No evidence for
hydronephrosis or mass.

## 2008-10-31 IMAGING — CT CT PELVIS W/O CM
1 series · 15 of 32 positions shown, 19 images · non-contrast
Comparison: 04/18/2008.

CT ABDOMEN

CLINICAL DATA: Nausea and vomiting.  Left upper quadrant pain.

CT ABDOMEN AND PELVIS WITHOUT CONTRAST
TECHNIQUE: Multidetector CT imaging of the abdomen and pelvis was
performed following the standard
protocol without intravenous contrast.

[Series 2: abd_pel 5.0 b40f st · axial · 0.62mm/px · z∈[-482,-62]mm · 15 of 95 slices shown, 19 images]
[im 7/95  soft-tissue]
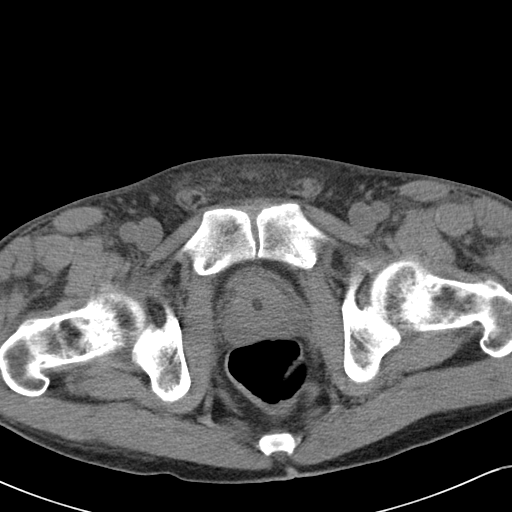
[im 7/95  bone]
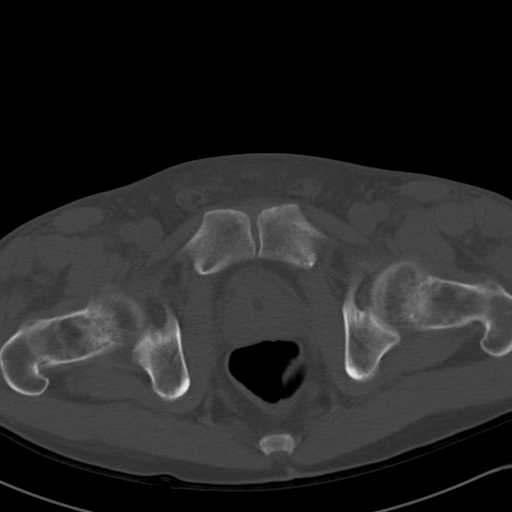
[im 13/95  soft-tissue]
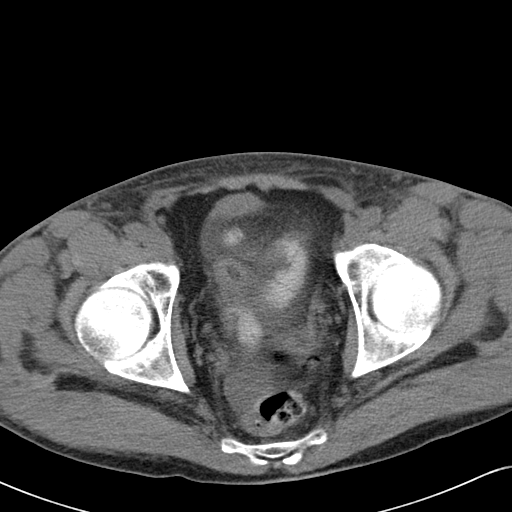
[im 19/95  soft-tissue]
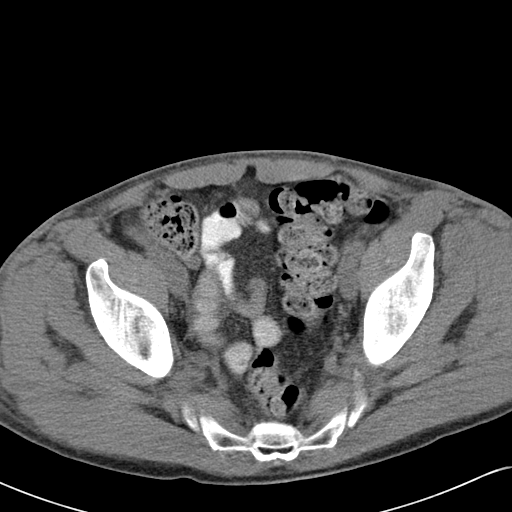
[im 28/95  soft-tissue]
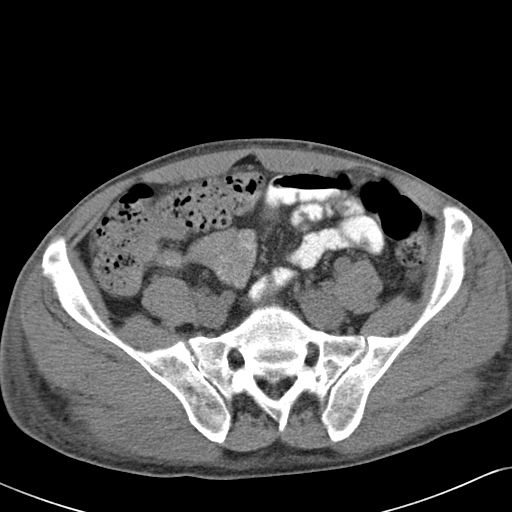
[im 34/95  soft-tissue]
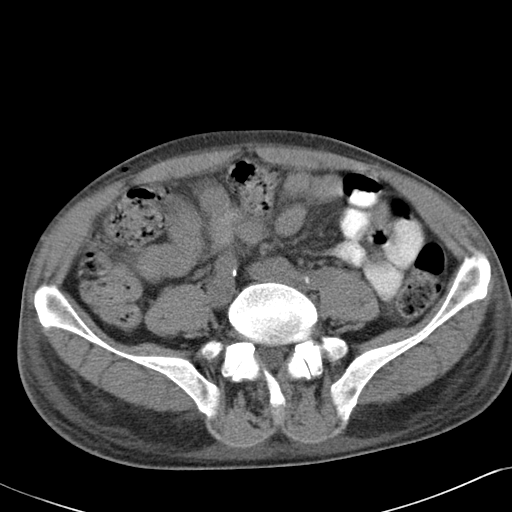
[im 40/95  soft-tissue]
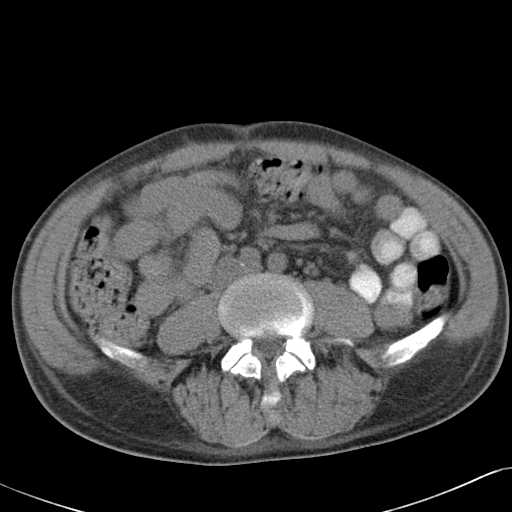
[im 49/95  soft-tissue]
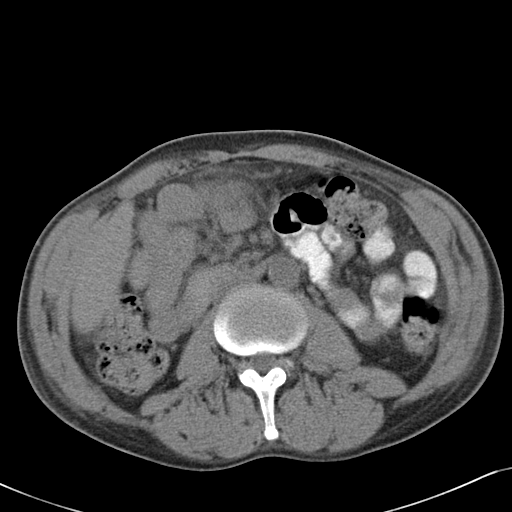
[im 55/95  soft-tissue]
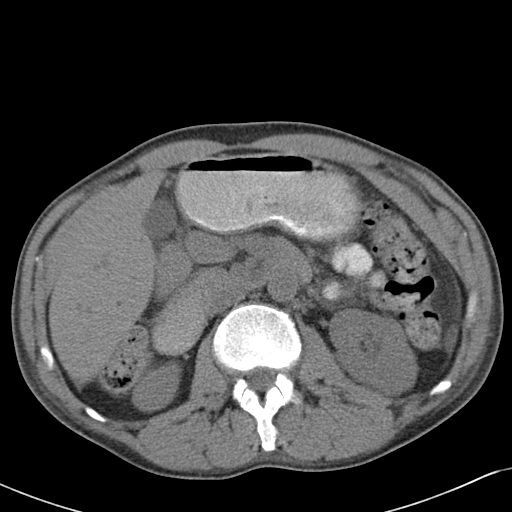
[im 61/95  soft-tissue]
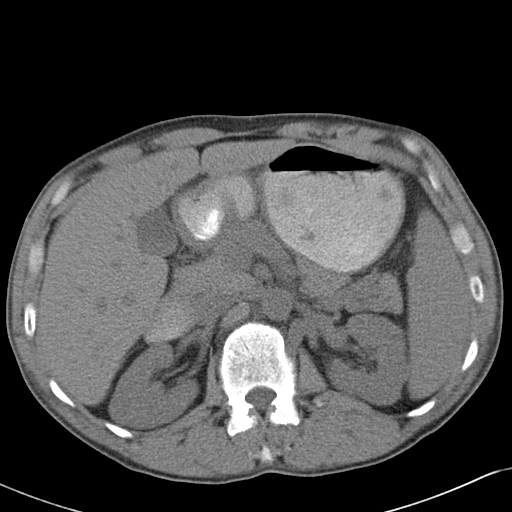
[im 61/95  bone]
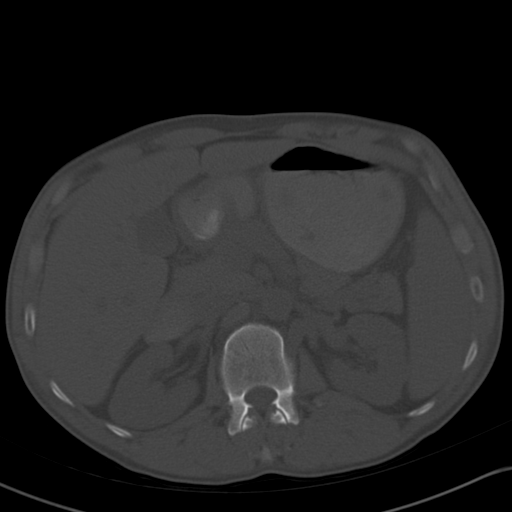
[im 67/95  soft-tissue]
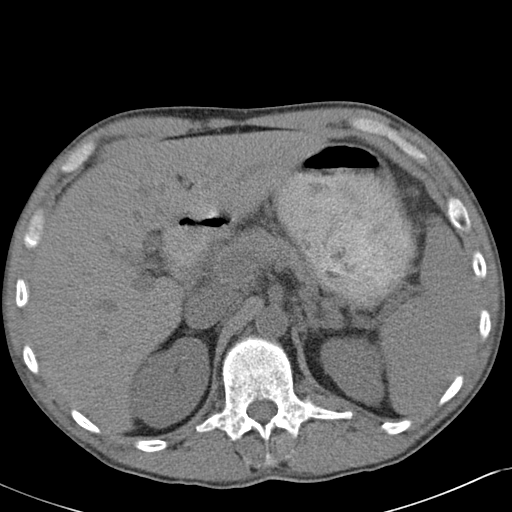
[im 76/95  soft-tissue]
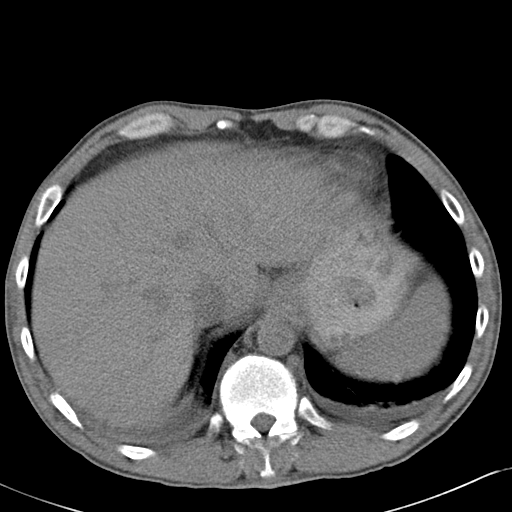
[im 82/95  soft-tissue]
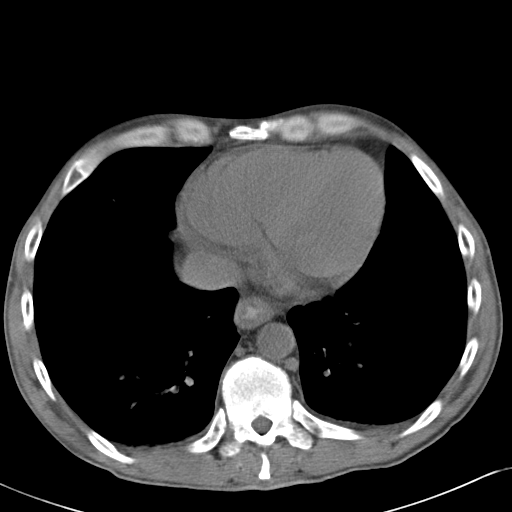
[im 82/95  lung]
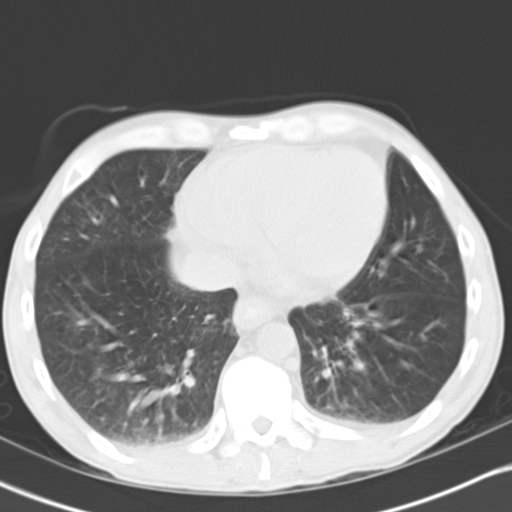
[im 85/95  lung]
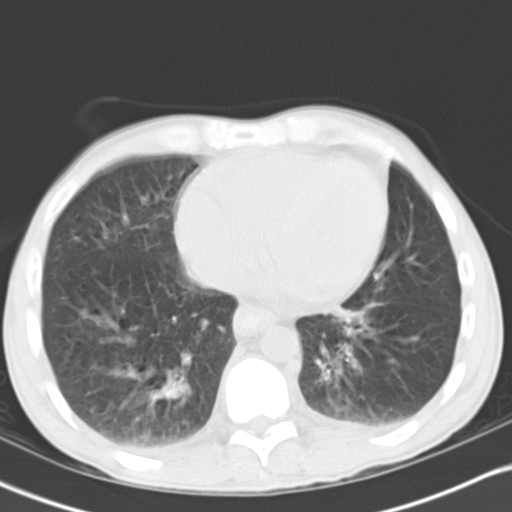
[im 88/95  soft-tissue]
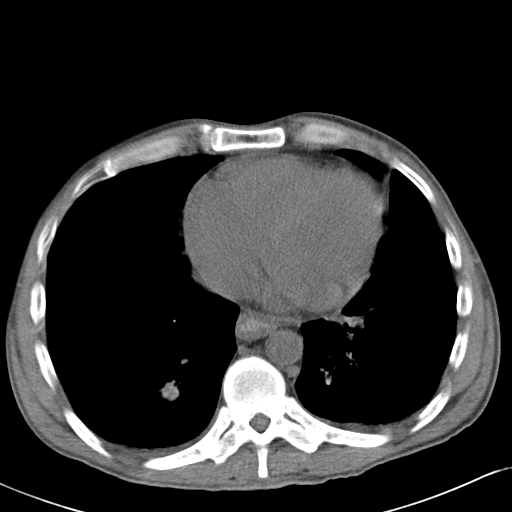
[im 88/95  lung]
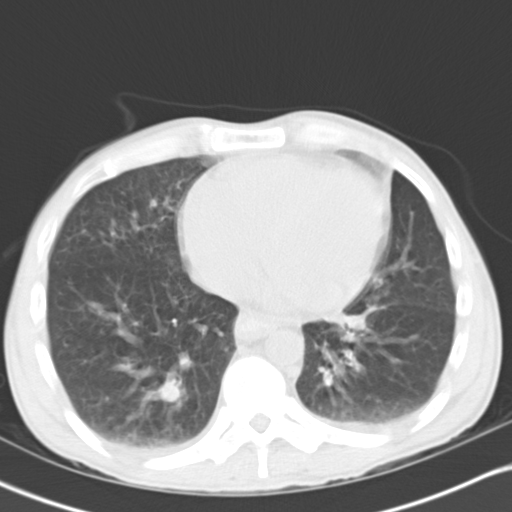
[im 91/95  lung]
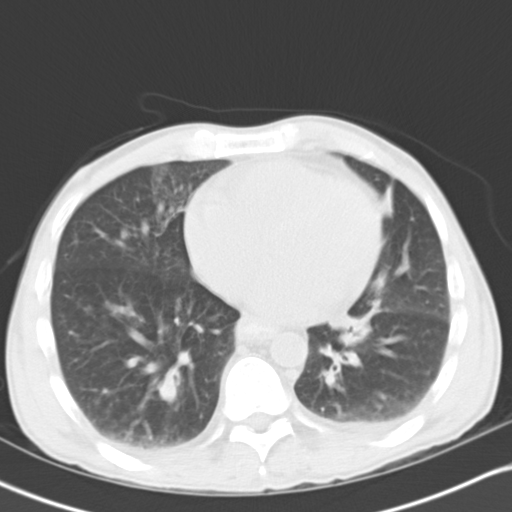

[15 of 32 positions shown; findings below may reference images not displayed]

FINDINGS: Tiny bilateral pleural effusions are now present.  There
is dependent airspace disease bilaterally, compatible with
atelectasis.  The heart size is normal. Contrast another material
is present within the stomach.  The uninfused appearance of the
liver is within normal limits.  The spleen is unremarkable.  The
pancreas and gallbladder are normal.  The adrenal glands and
kidneys are within normal limits.  Contrast can be seen within
proximal bowel.  There is no significant bowel dilation or air-
fluid levels.  There is no significant abdominal lymphadenopathy or
free fluid.  Bone windows reveal no focal lytic or blastic lesions.
IMPRESSION: 1.  Interval development of small bilateral pleural effusions and
associated atelectasis.
2.  No acute abnormality in the abdomen or significant interval
change.

CT PELVIS
FINDINGS: The rectosigmoid colon is within normal limits.  A small
amount of free fluid layers dependently within the anatomic pelvis.
The remainder of the colon is normal.  The appendix is not clearly
visualized.  A Foley catheter is present within the urinary
bladder.  There is some air, likely related to instrumentation.
The distal bowel is unremarkable.  No significant lymphadenopathy
is present.  Bone windows are within normal limits.
IMPRESSION: 1.  Small to moderate fluid layering within the anatomic pelvis.
The etiology of this is uncertain but it suggests an inflammatory
process.
2.  No focal abnormality.
3.  Foley catheter within the urinary bladder.

## 2008-11-07 IMAGING — CR DG CHEST 2V
2 series · 2 of 2 positions shown · non-contrast
Comparison: 04/24/2008

CLINICAL DATA: COPD.  Hemoptysis.

CHEST - 2 VIEW

[w chest pa]
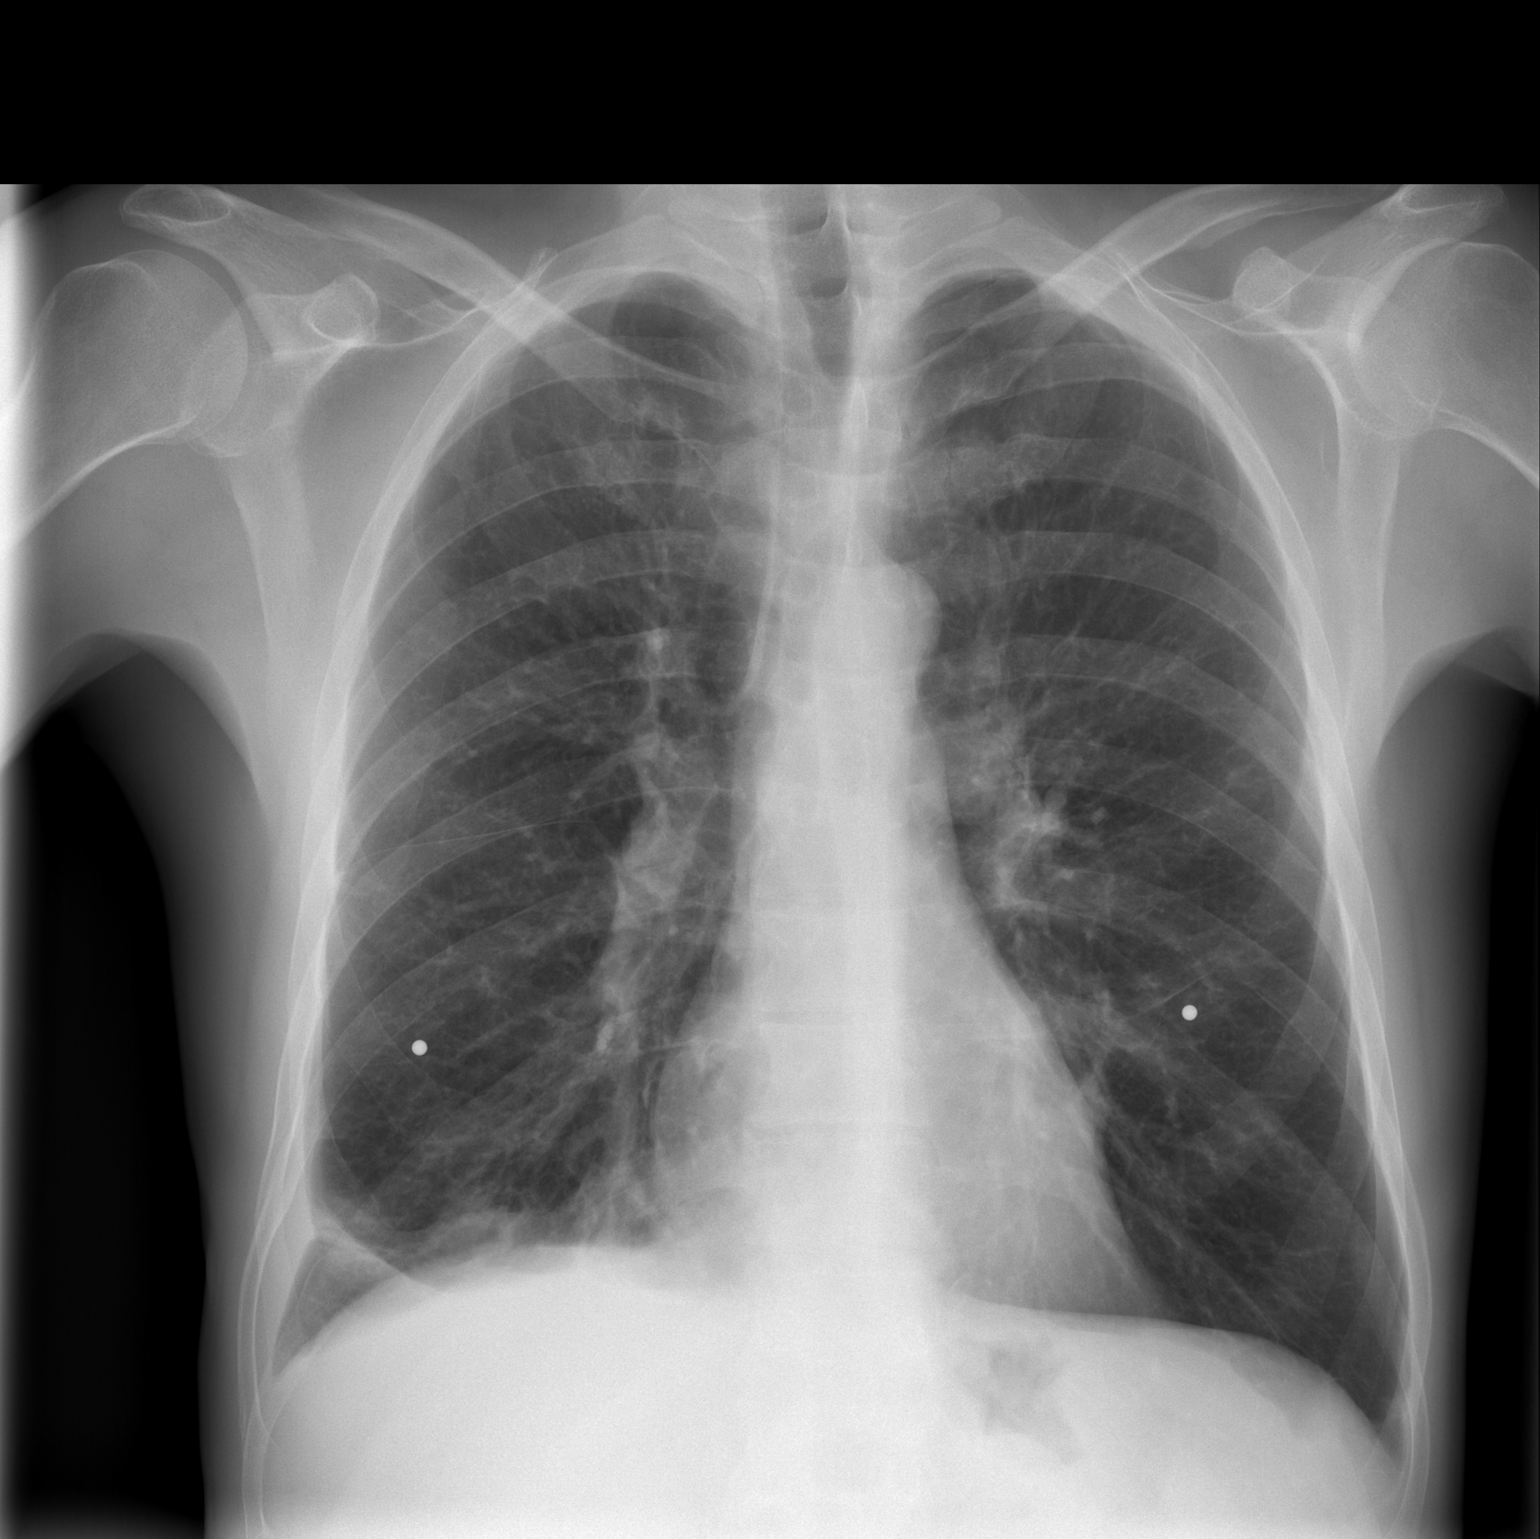

[w chest lat]
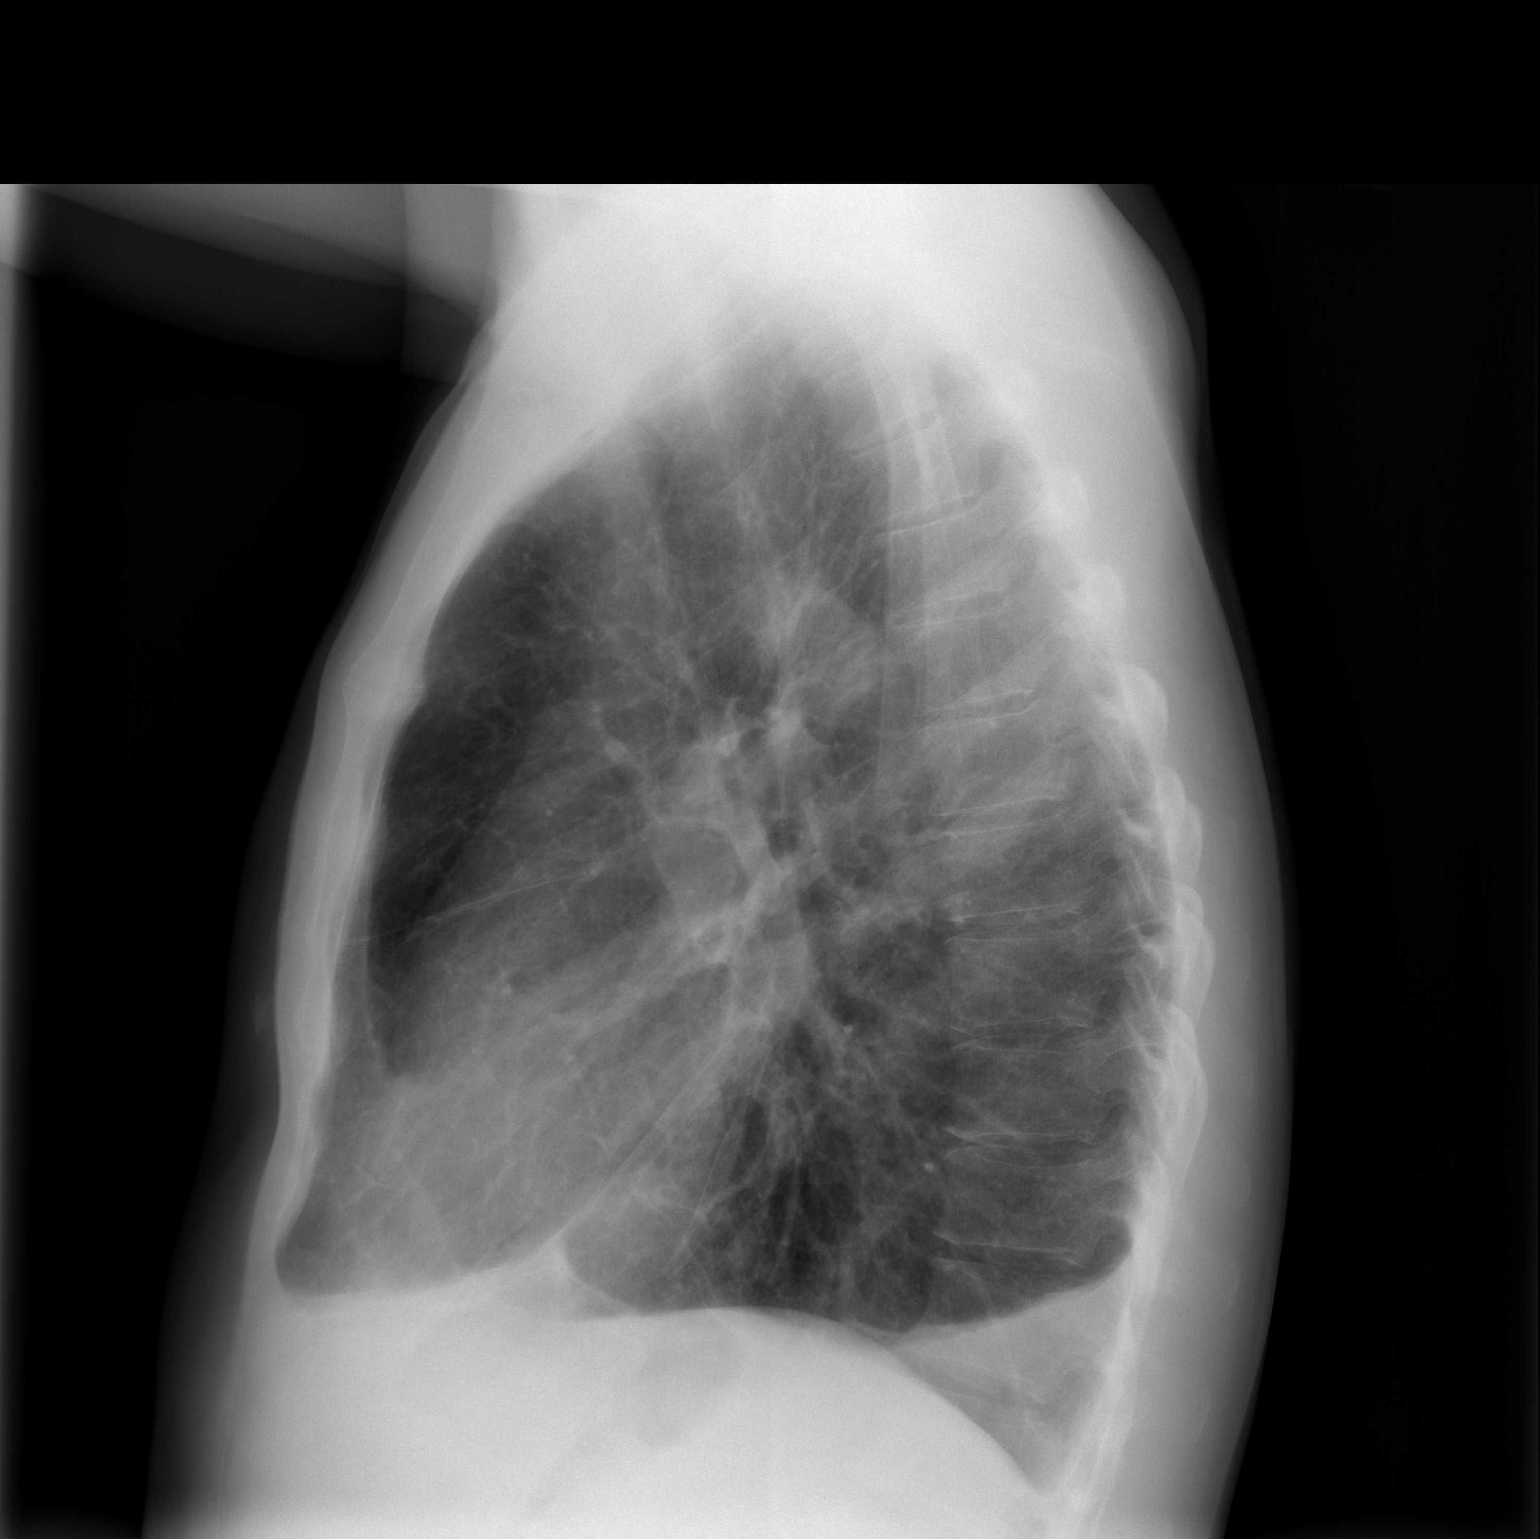

[2 of 2 positions shown; findings below may reference images not displayed]

FINDINGS: Heart size is normal.

There is a new small right pleural effusion.

Overlying right base atelectasis is also new.

Interstitial change consistent with COPD is noted
IMPRESSION: 1.  New right pleural effusion with overlying atelectasis.

## 2008-11-09 IMAGING — CR DG CHEST SPECIAL VIEW
1 series · 1 of 1 positions shown · non-contrast
Comparison: 05/02/2008

CLINICAL DATA: Evaluate for nipple artifact.

04/18/2008
CHEST SPECIAL VIEW

[w chest pa]
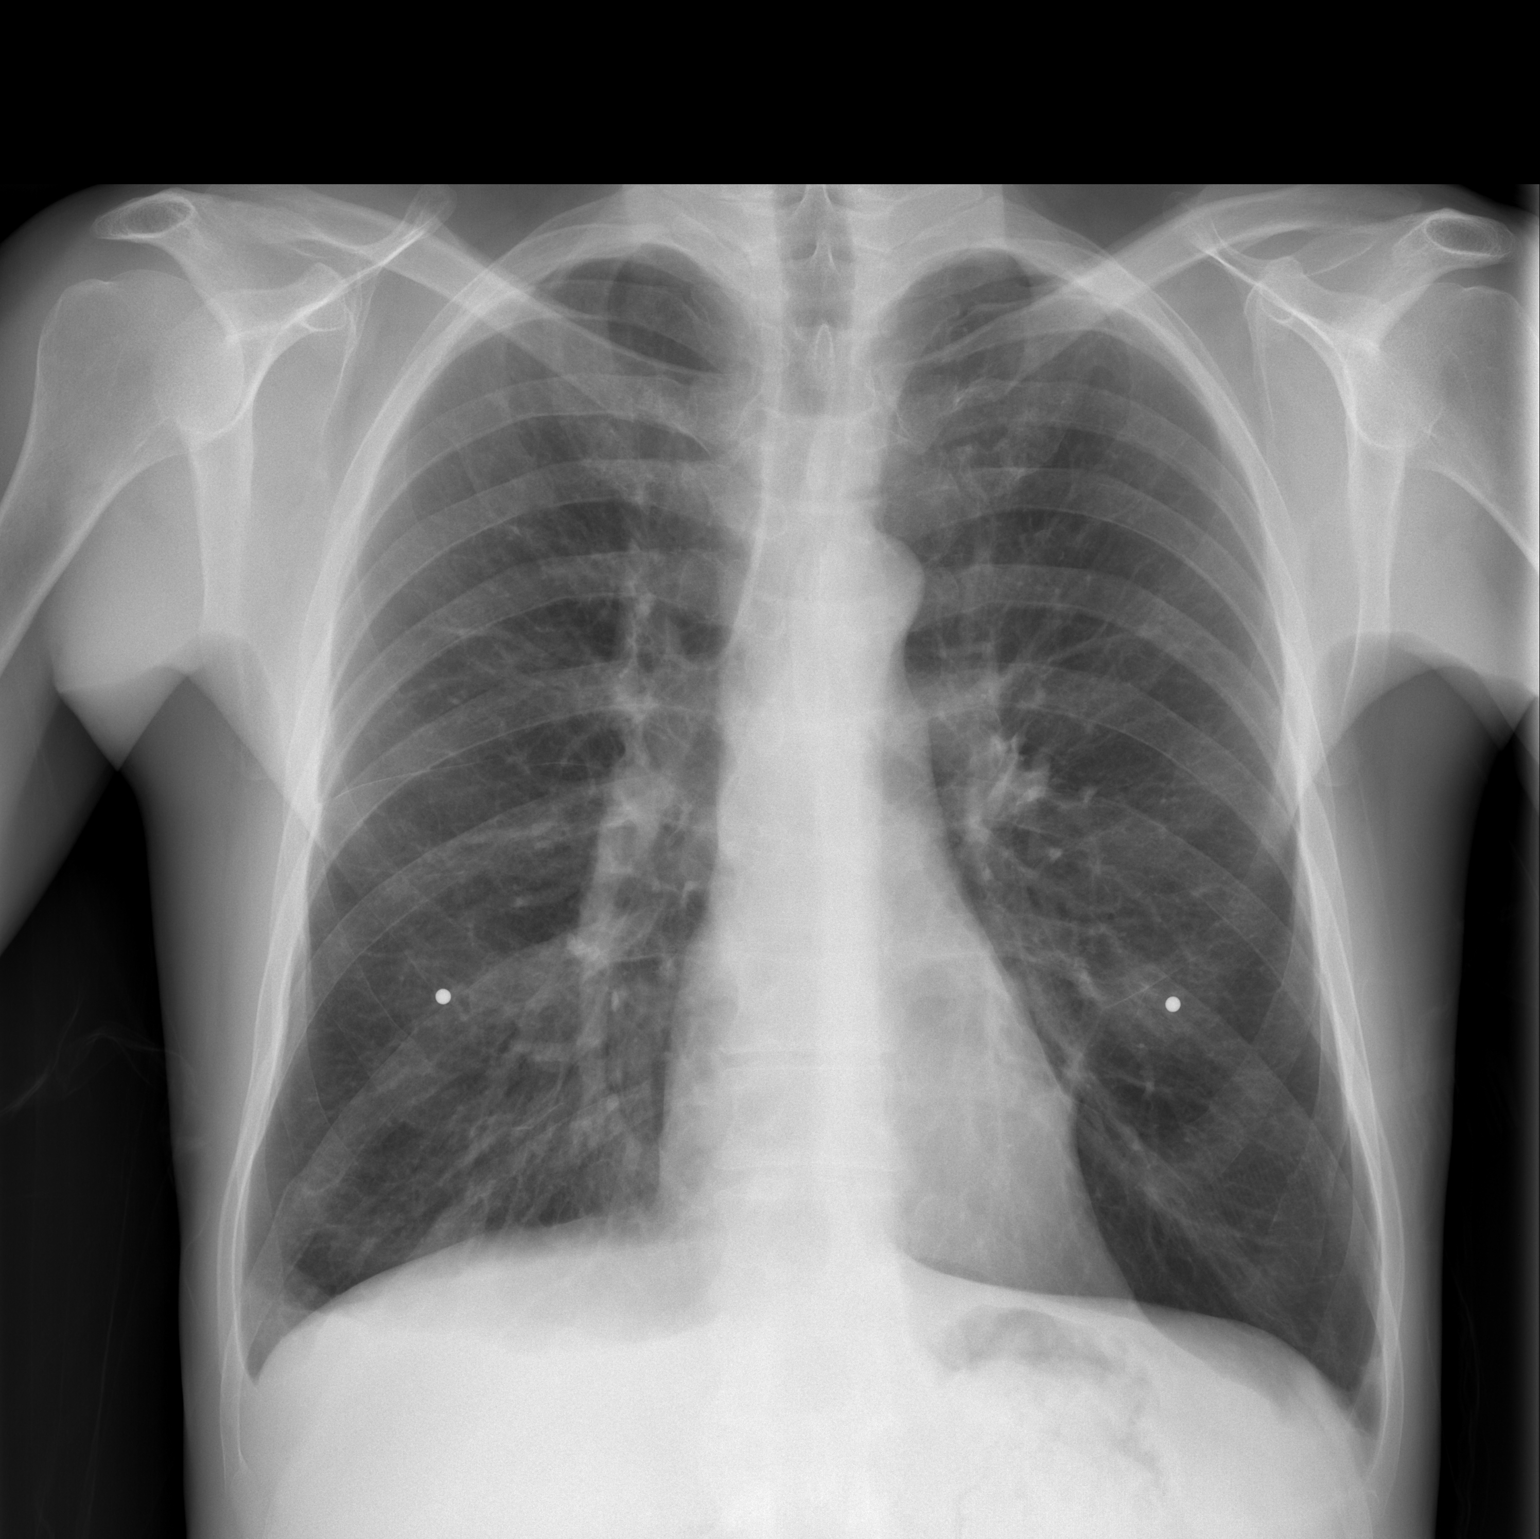

[1 of 1 positions shown; findings below may reference images not displayed]

FINDINGS: Examination performed with nipple markers.

There is been decrease in right effusion and overlying atelectasis.

No new findings are identified.

The previously noted nodular density from 04/24/2008 is not seen on
today's study..
IMPRESSION: 1.  Decrease in right effusion with overlying atelectasis.

## 2008-11-09 IMAGING — CR DG CHEST 2V
2 series · 2 of 2 positions shown · non-contrast
Comparison: 05/03/2007

CLINICAL DATA: Left lower chest pain

CHEST - 2 VIEW

[w chest pa]
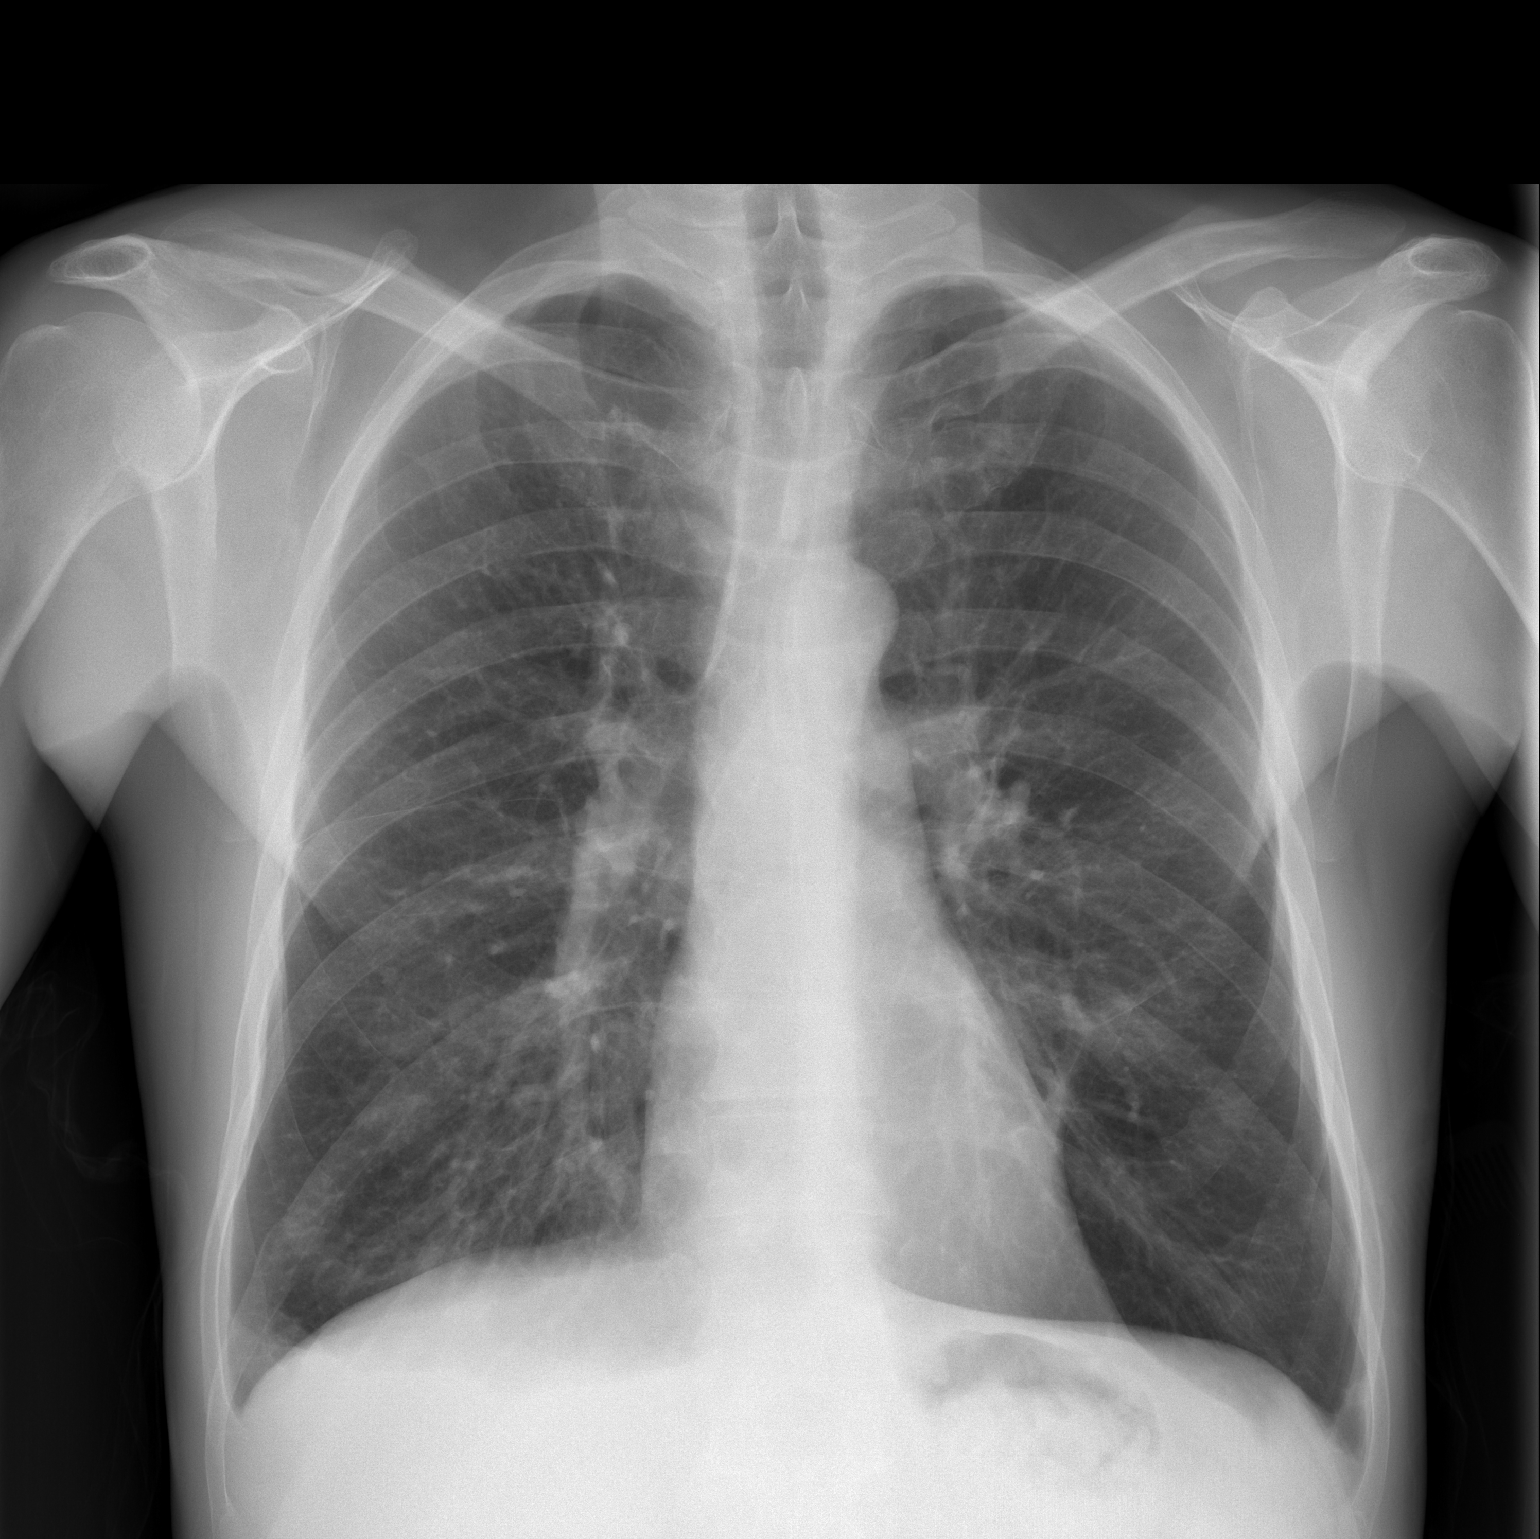

[w chest lat]
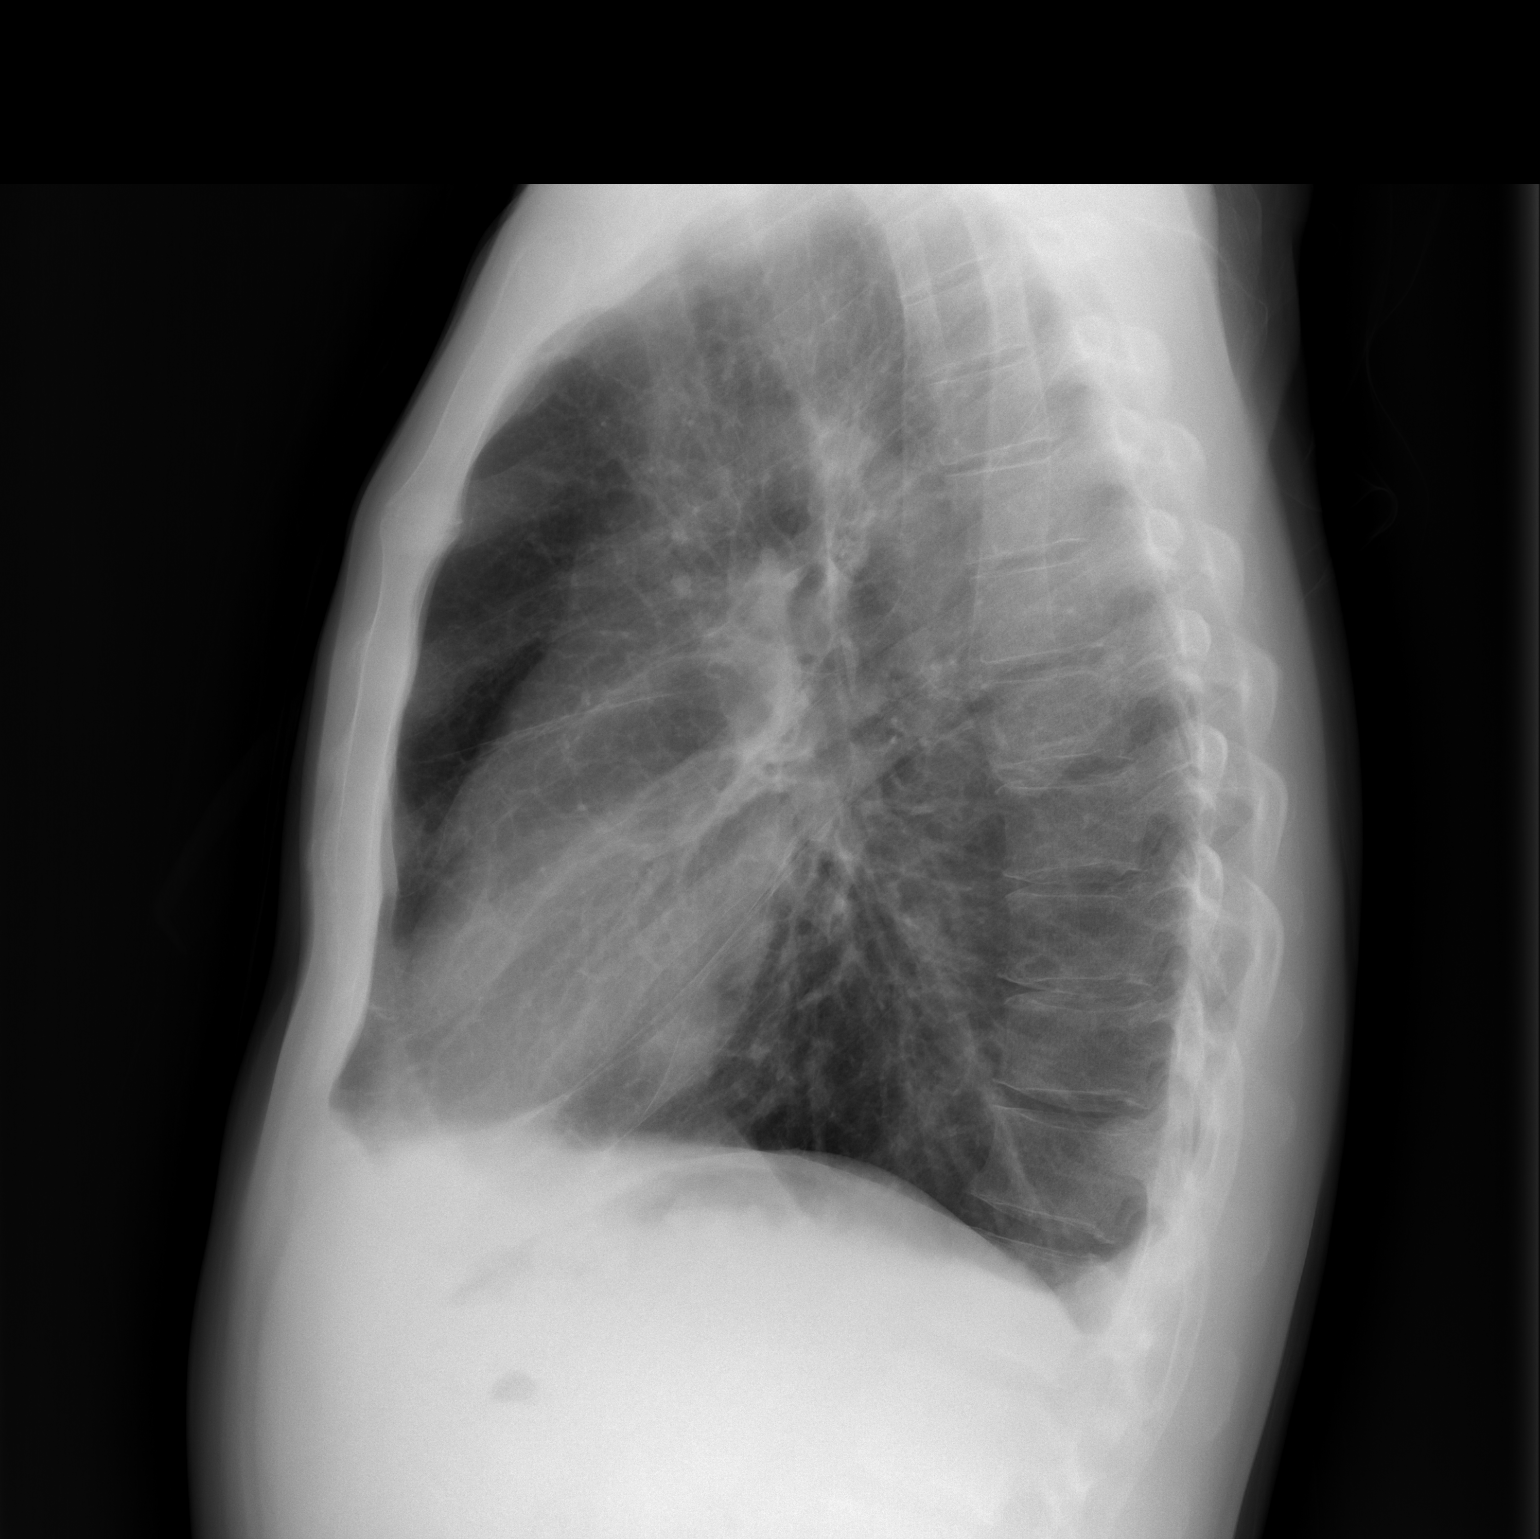

[2 of 2 positions shown; findings below may reference images not displayed]

FINDINGS: Heart size is normal.

No pleural effusion or pulmonary interstitial edema. There has been
interval decrease in the right pleural effusion.

Improved atelectasis to the right lung base.

There is scarring within the left lower lobe unchanged from prior
exam.
IMPRESSION: 1.  Improved right effusion and overlying atelectasis.

## 2008-12-06 IMAGING — CR DG CHEST 1V PORT
1 series · 1 of 1 positions shown · non-contrast
Comparison: Two-view chest x-ray 05/04/2008.

CLINICAL DATA: COPD exacerbation.  Delirium tremens.

PORTABLE CHEST - 1 VIEW [DATE]/[id number] hours:

[view not recorded]
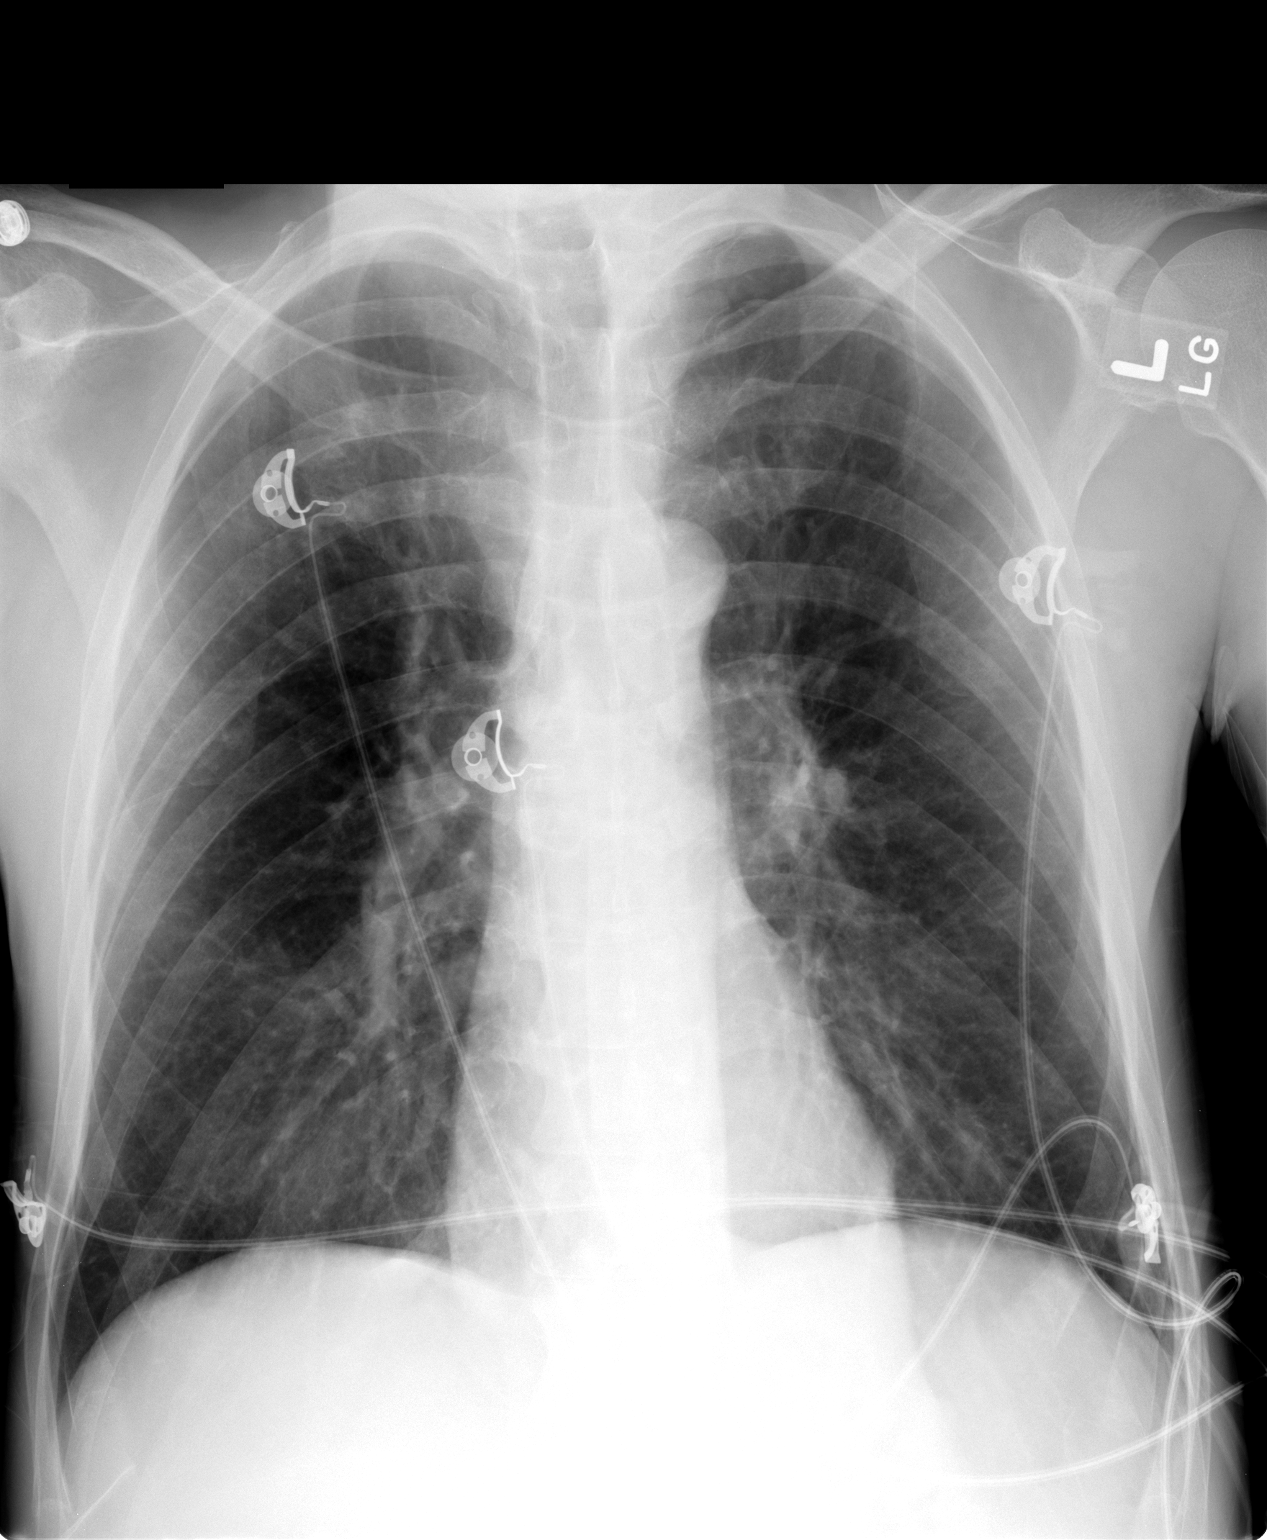

[1 of 1 positions shown; findings below may reference images not displayed]

FINDINGS: Cardiomediastinal silhouette unremarkable for technique.
Hyperinflation.  Lungs clear.  No pleural effusions.
IMPRESSION: Hyperinflation consistent with COPD or asthma.  No acute
cardiopulmonary disease.

## 2008-12-08 IMAGING — US US ABDOMEN COMPLETE
1 series · 14 of 25 positions shown · non-contrast
Comparison: none

CLINICAL DATA: Delirium.  Abdominal pain.

ABDOMEN ULTRASOUND
TECHNIQUE: Complete abdominal ultrasound examination was performed
including evaluation of the liver, gallbladder, bile ducts,
pancreas, kidneys, spleen, IVC, and abdominal aorta.

[Series 1: abdomen · 0.27mm/px · 14 of 69 slices shown]
[im 1/69]
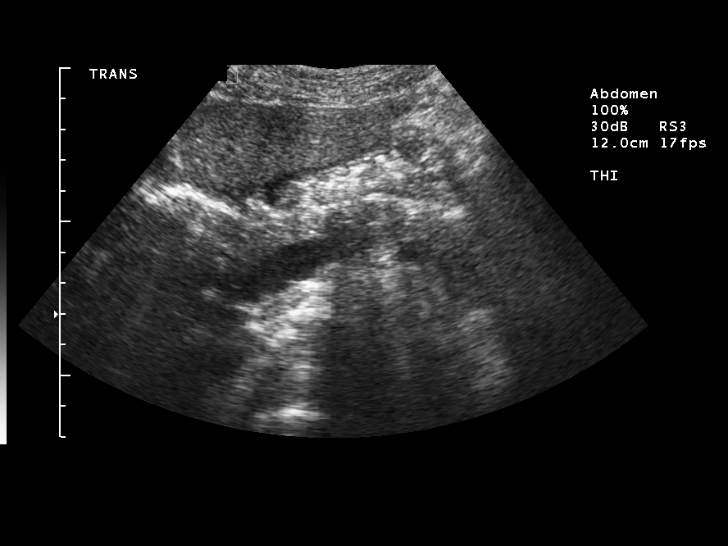
[im 6/69]
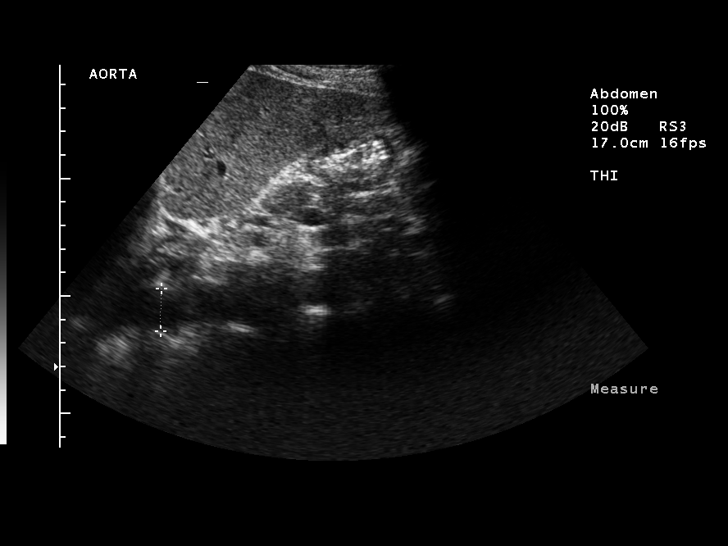
[im 12/69]
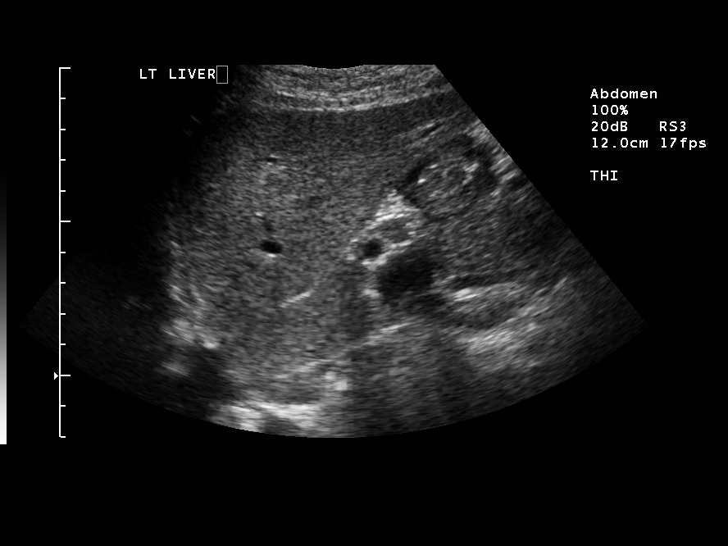
[im 18/69]
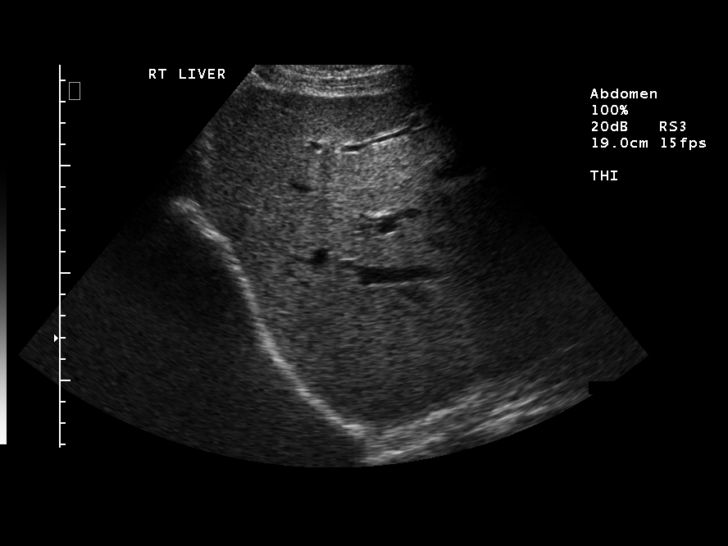
[im 23/69]
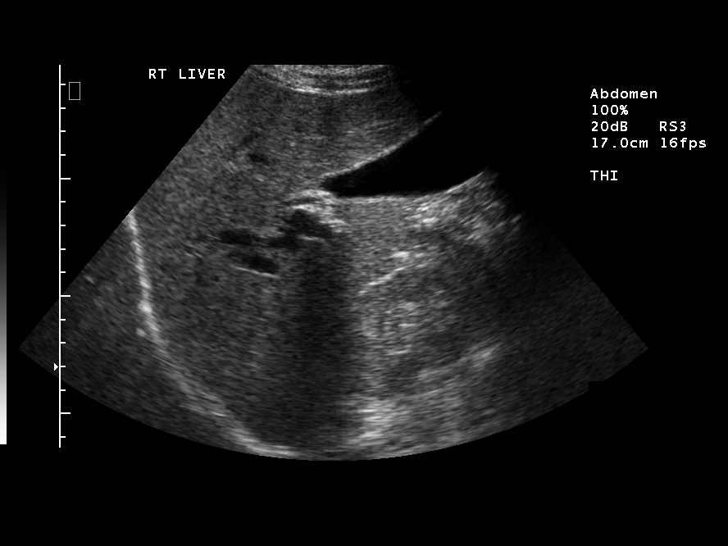
[im 26/69]
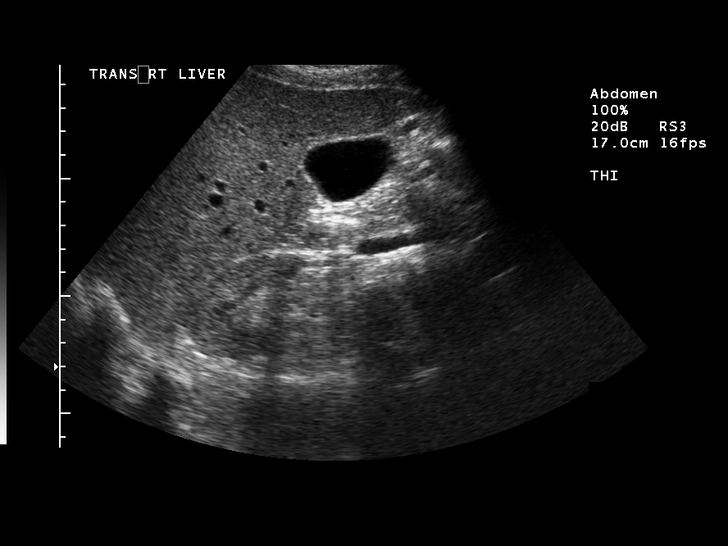
[im 32/69]
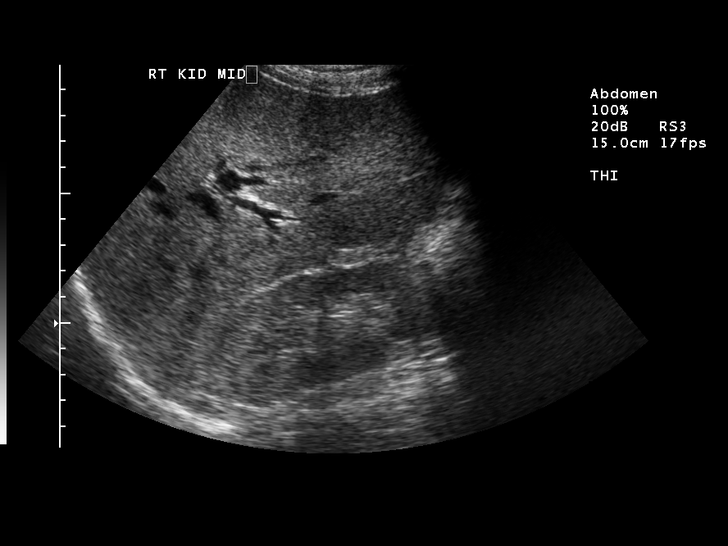
[im 37/69]
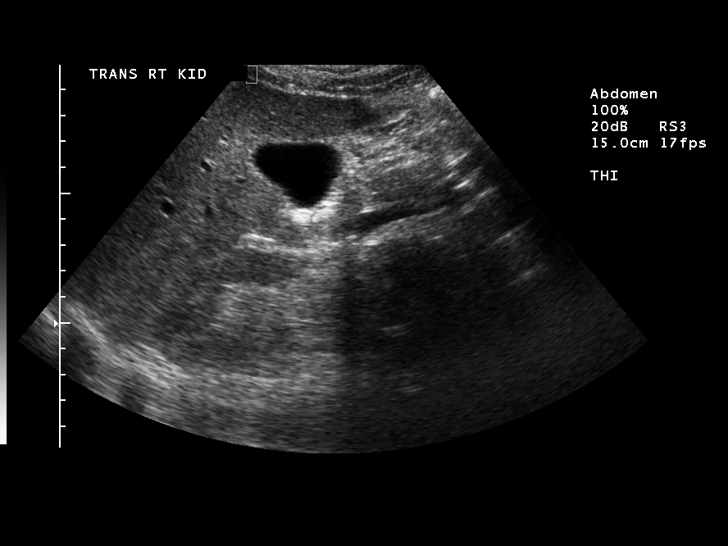
[im 43/69]
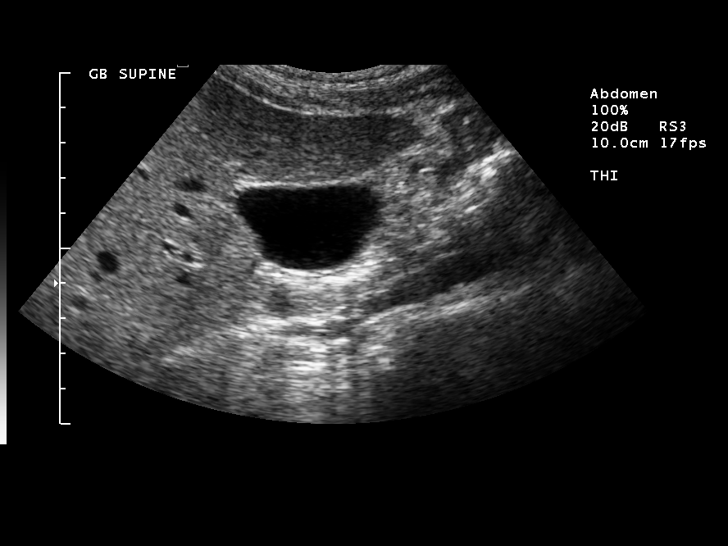
[im 46/69]
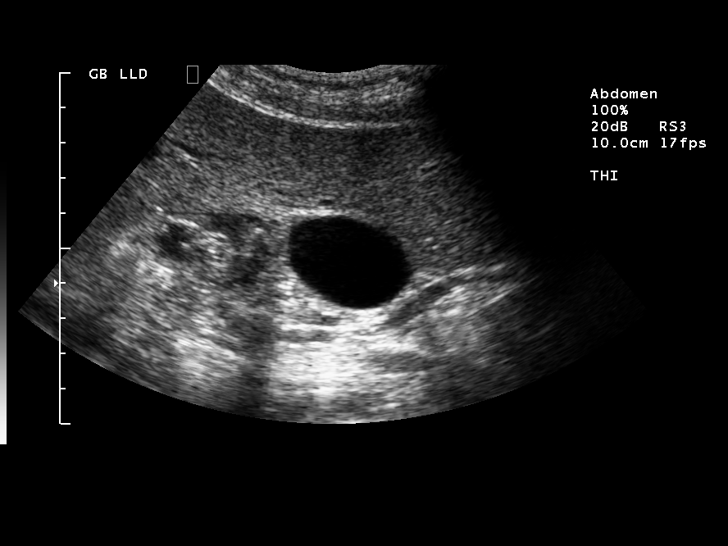
[im 52/69]
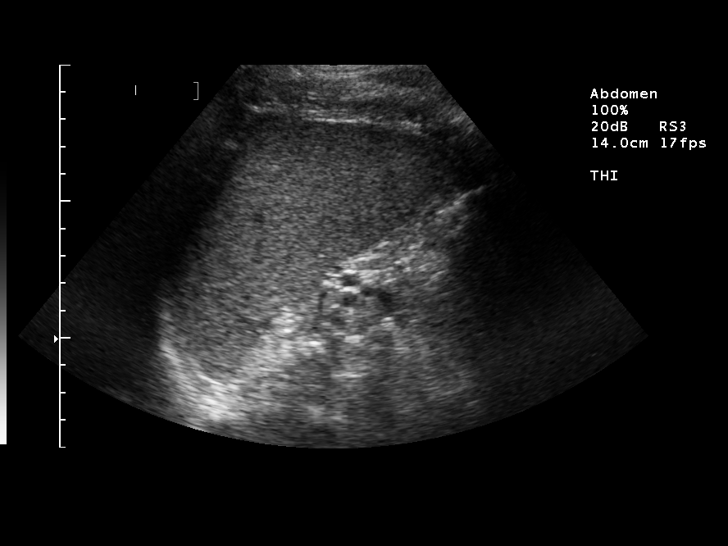
[im 57/69]
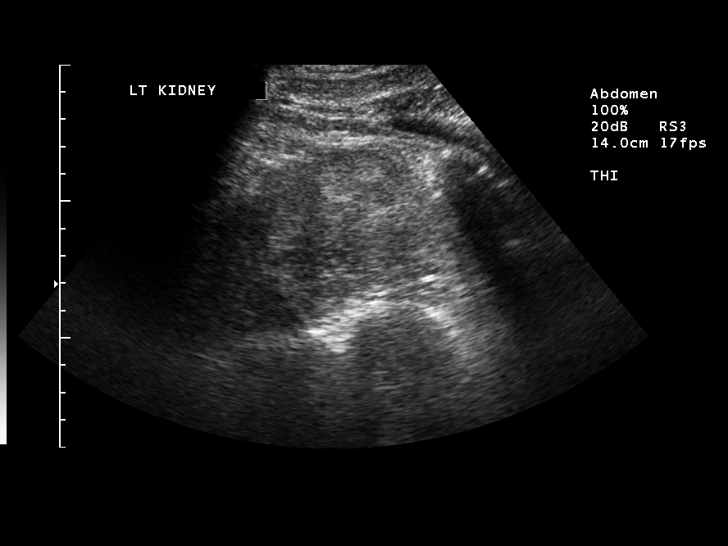
[im 63/69]
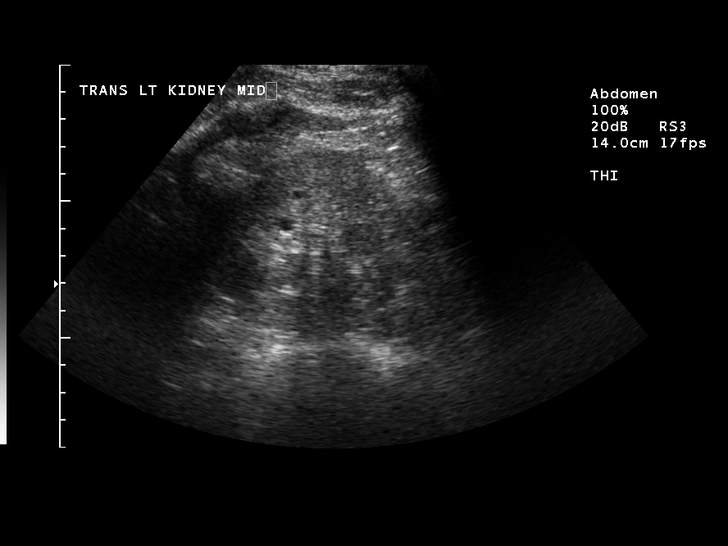
[im 69/69]
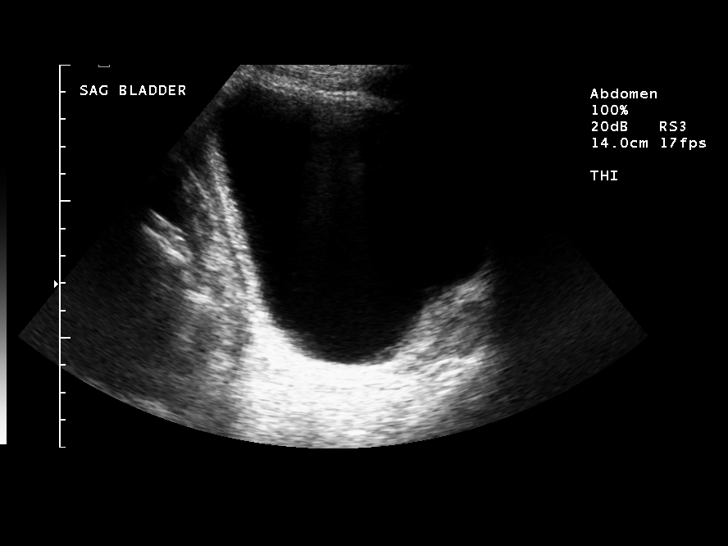

[14 of 25 positions shown; findings below may reference images not displayed]

FINDINGS: The gallbladder is within normal limits.  No gallstones
or wall thickening.

The common bile duct is 5 mm in caliber per

Liver is normal in echogenicity without focal mass

5 ml patent.

The spleen is within normal limits.

Kidneys are increased in echogenicity without hydronephrosis or or
focal mass.

Maximal aortic caliber is 1.9 cm per

Images of the pancreas demonstrate no focal mass
IMPRESSION: Increased renal cortical echogenicity, stable compared with
04/25/2008.

No acute disease.

## 2008-12-13 IMAGING — CT CT ABDOMEN W/O CM
2 of 4 series · 17 of 46 positions shown, 19 images · non-contrast
Comparison: Acute abdominal series done today and abdominal pelvic
CT 04/25/2008.

CT ABDOMEN

CLINICAL DATA: Left flank pain with bruising since falling 2 days
ago.  Nausea and vomiting.  Renal insufficiency.

CT ABDOMEN AND PELVIS WITHOUT CONTRAST
TECHNIQUE: Multidetector CT imaging of the abdomen and pelvis was
performed following the standard protocol without intravenous
contrast.

[Series 2: abd_pel 5.0 b40f st · axial · 0.74mm/px · z∈[-475,-75]mm · 14 of 88 slices shown, 16 images]
[im 4/88  soft-tissue]
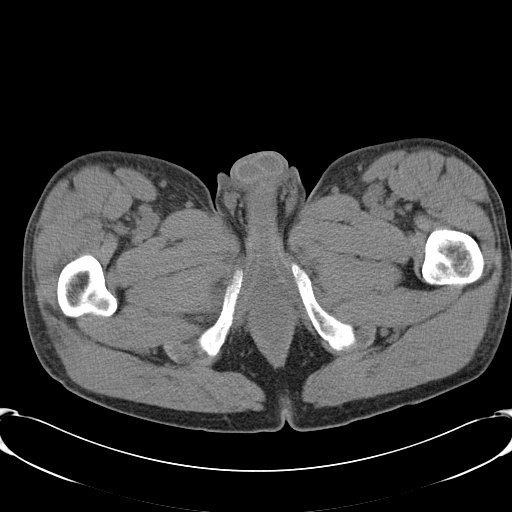
[im 4/88  bone]
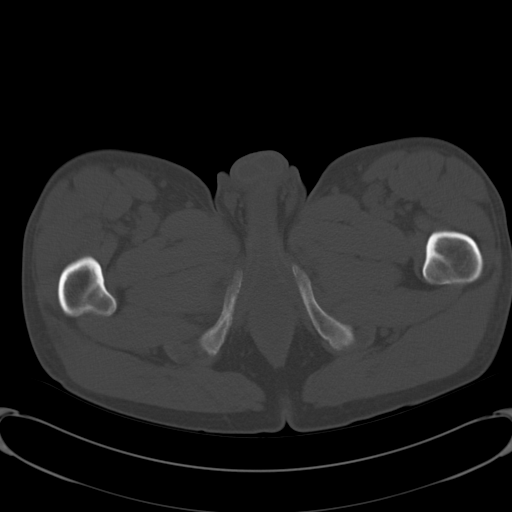
[im 11/88  soft-tissue]
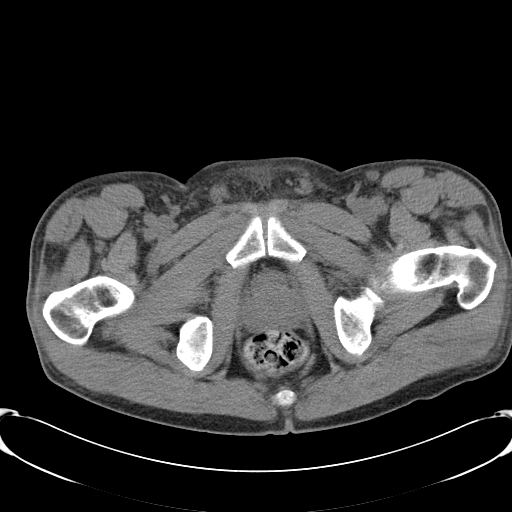
[im 18/88  soft-tissue]
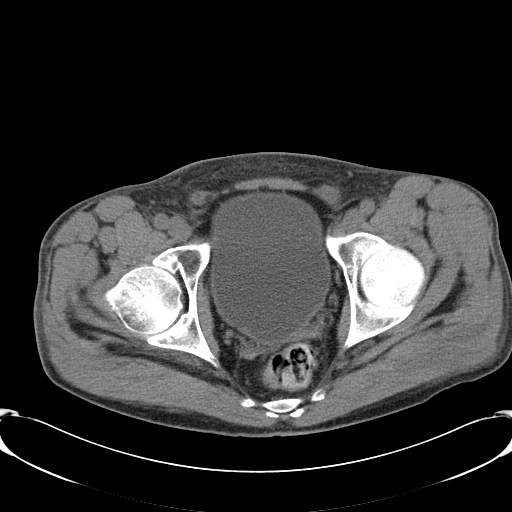
[im 25/88  soft-tissue]
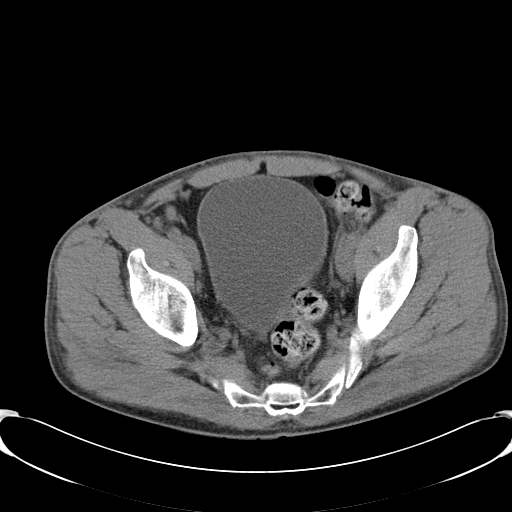
[im 28/88  soft-tissue]
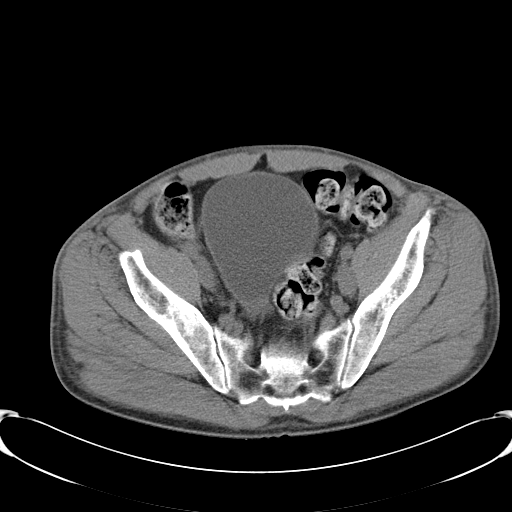
[im 35/88  soft-tissue]
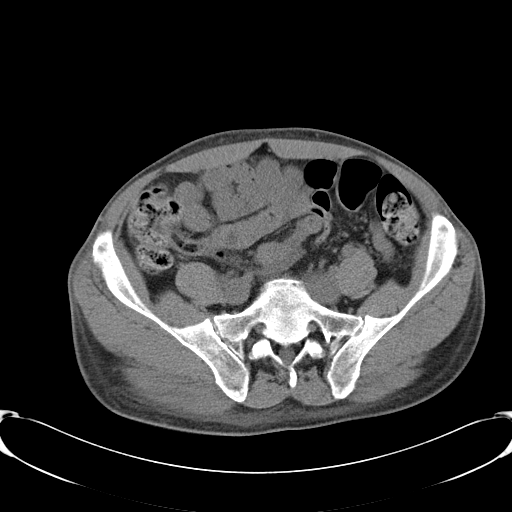
[im 42/88  soft-tissue]
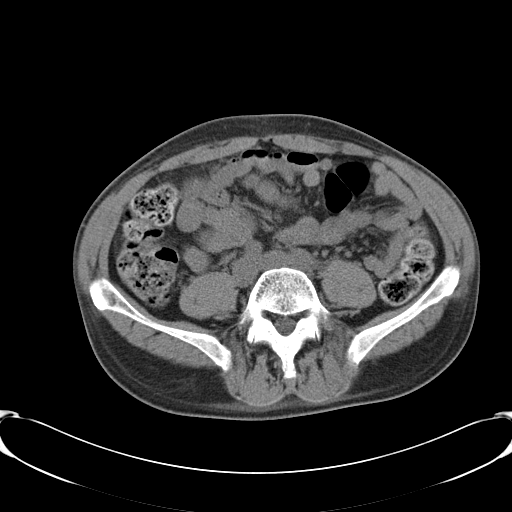
[im 46/88  soft-tissue]
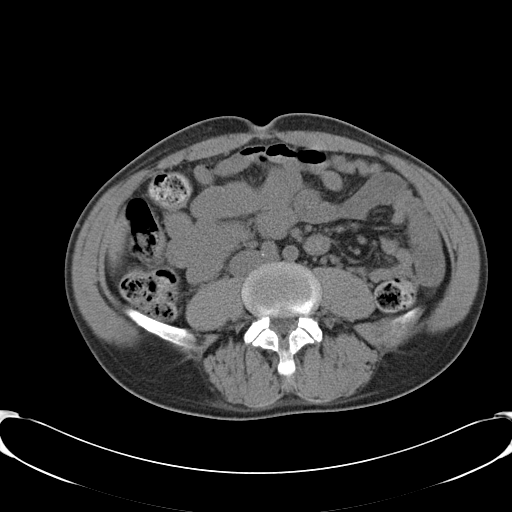
[im 53/88  soft-tissue]
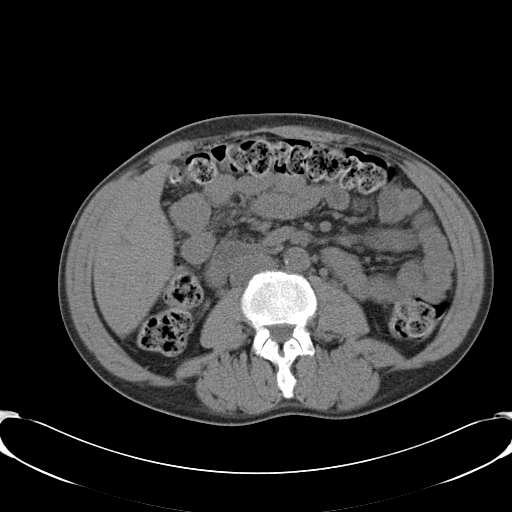
[im 53/88  bone]
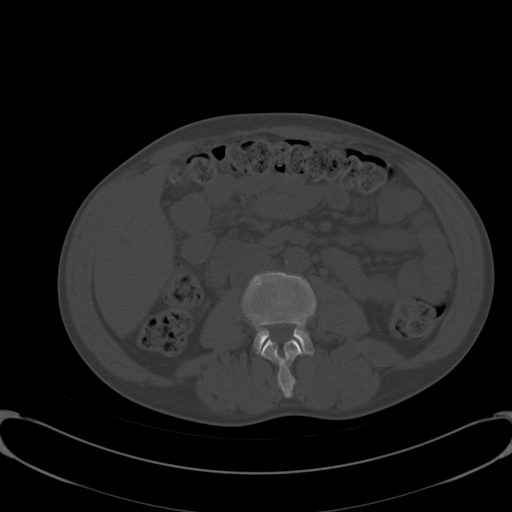
[im 60/88  soft-tissue]
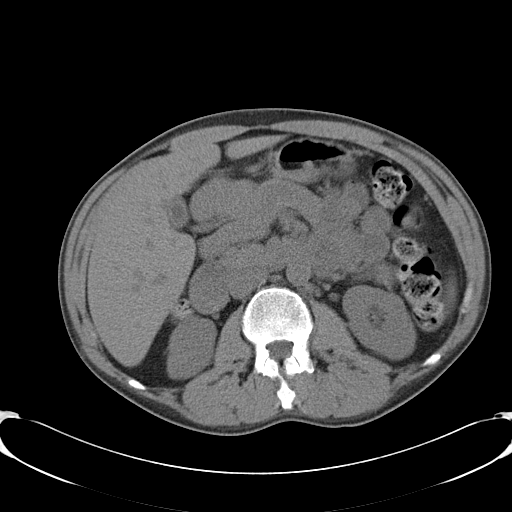
[im 67/88  soft-tissue]
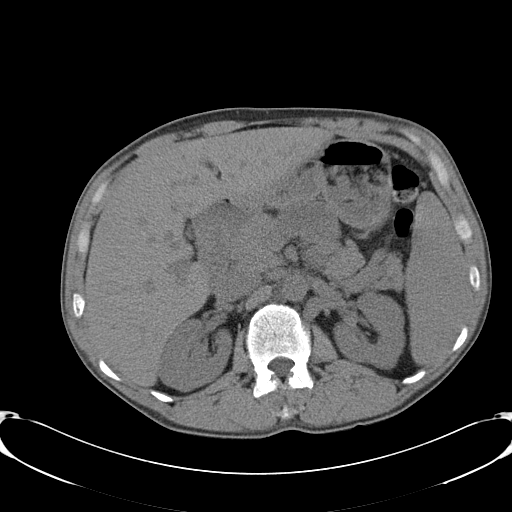
[im 70/88  soft-tissue]
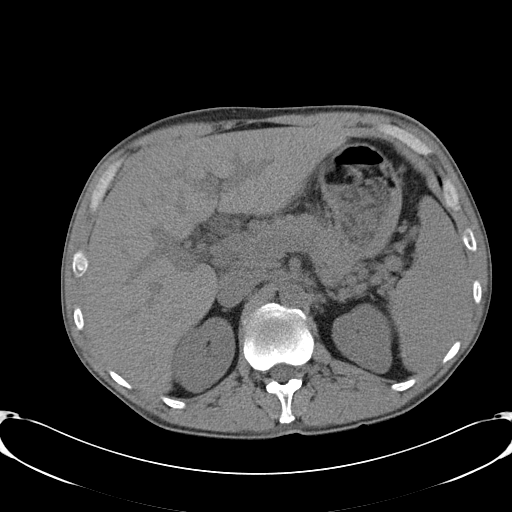
[im 77/88  soft-tissue]
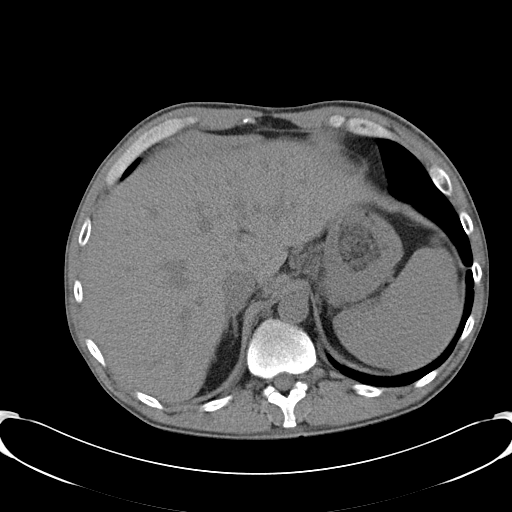
[im 84/88  soft-tissue]
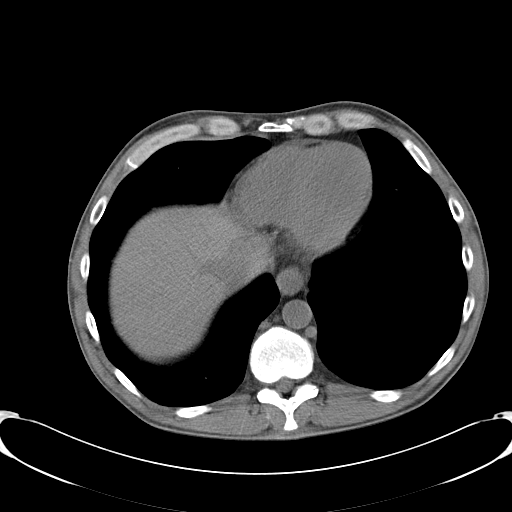

[Series 602: coronal images · coronal · 0.89mm/px · 3 of 76 slices shown]
[im 26/76  soft-tissue]
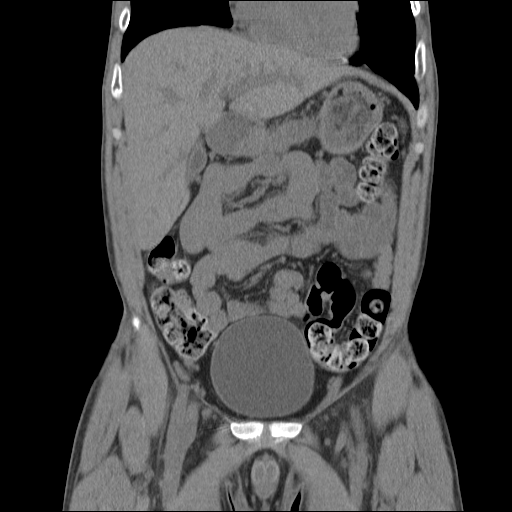
[im 34/76  soft-tissue]
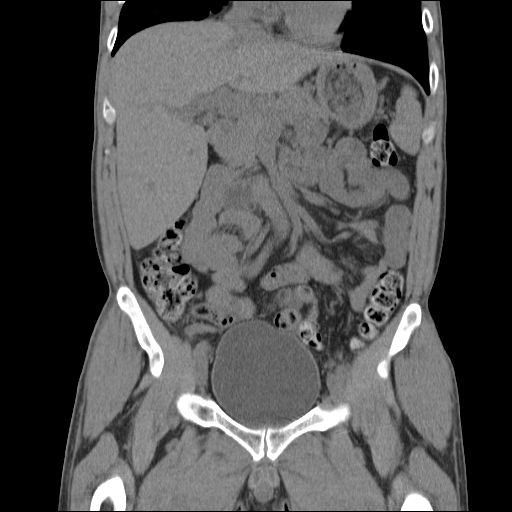
[im 42/76  soft-tissue]
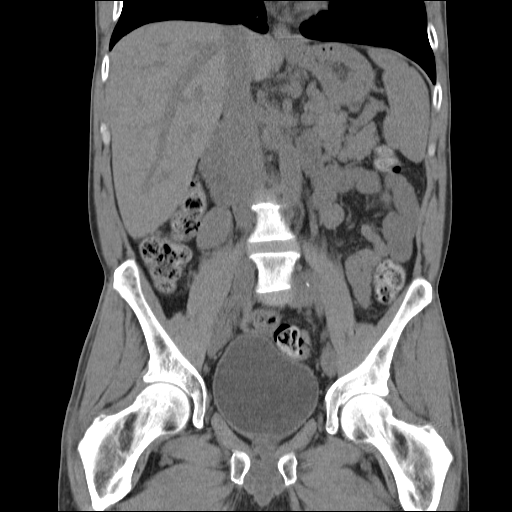

[17 of 46 positions shown; findings below may reference images not displayed]

FINDINGS: The lung bases are clear and there is no pleural
effusion.  No basilar pneumothorax or lower rib fracture is
identified.

As imaged in the noncontrast state, the liver, spleen, gallbladder,
pancreas, adrenal glands and kidneys appear normal.  There are
stable scattered vascular calcifications.  No bowel or mesenteric
abnormalities are identified. There are possible varices in the
left upper quadrant.
IMPRESSION: 1.  Stable examination.  No acute abdominal findings.
2.  Possible left upper quadrant varices, suboptimally evaluated
without contrast.

CT PELVIS
FINDINGS: There is no evidence of pelvic fracture, hematoma or
hemoperitoneum.  The bladder, prostate gland and seminal vesicles
appear stable.  A phlebolith on the right appears stable.  No
inflammatory changes are evident.
IMPRESSION: No acute pelvic findings.

## 2008-12-13 IMAGING — CR DG ABDOMEN 1V
2 series · 2 of 2 positions shown · non-contrast
Comparison: 04/11/2007

CLINICAL DATA: Left-sided abdominal pain.  Nausea vomiting.
Diarrhea.

ABDOMEN - 1 VIEW

[t abdomen supine (1 of 2)]
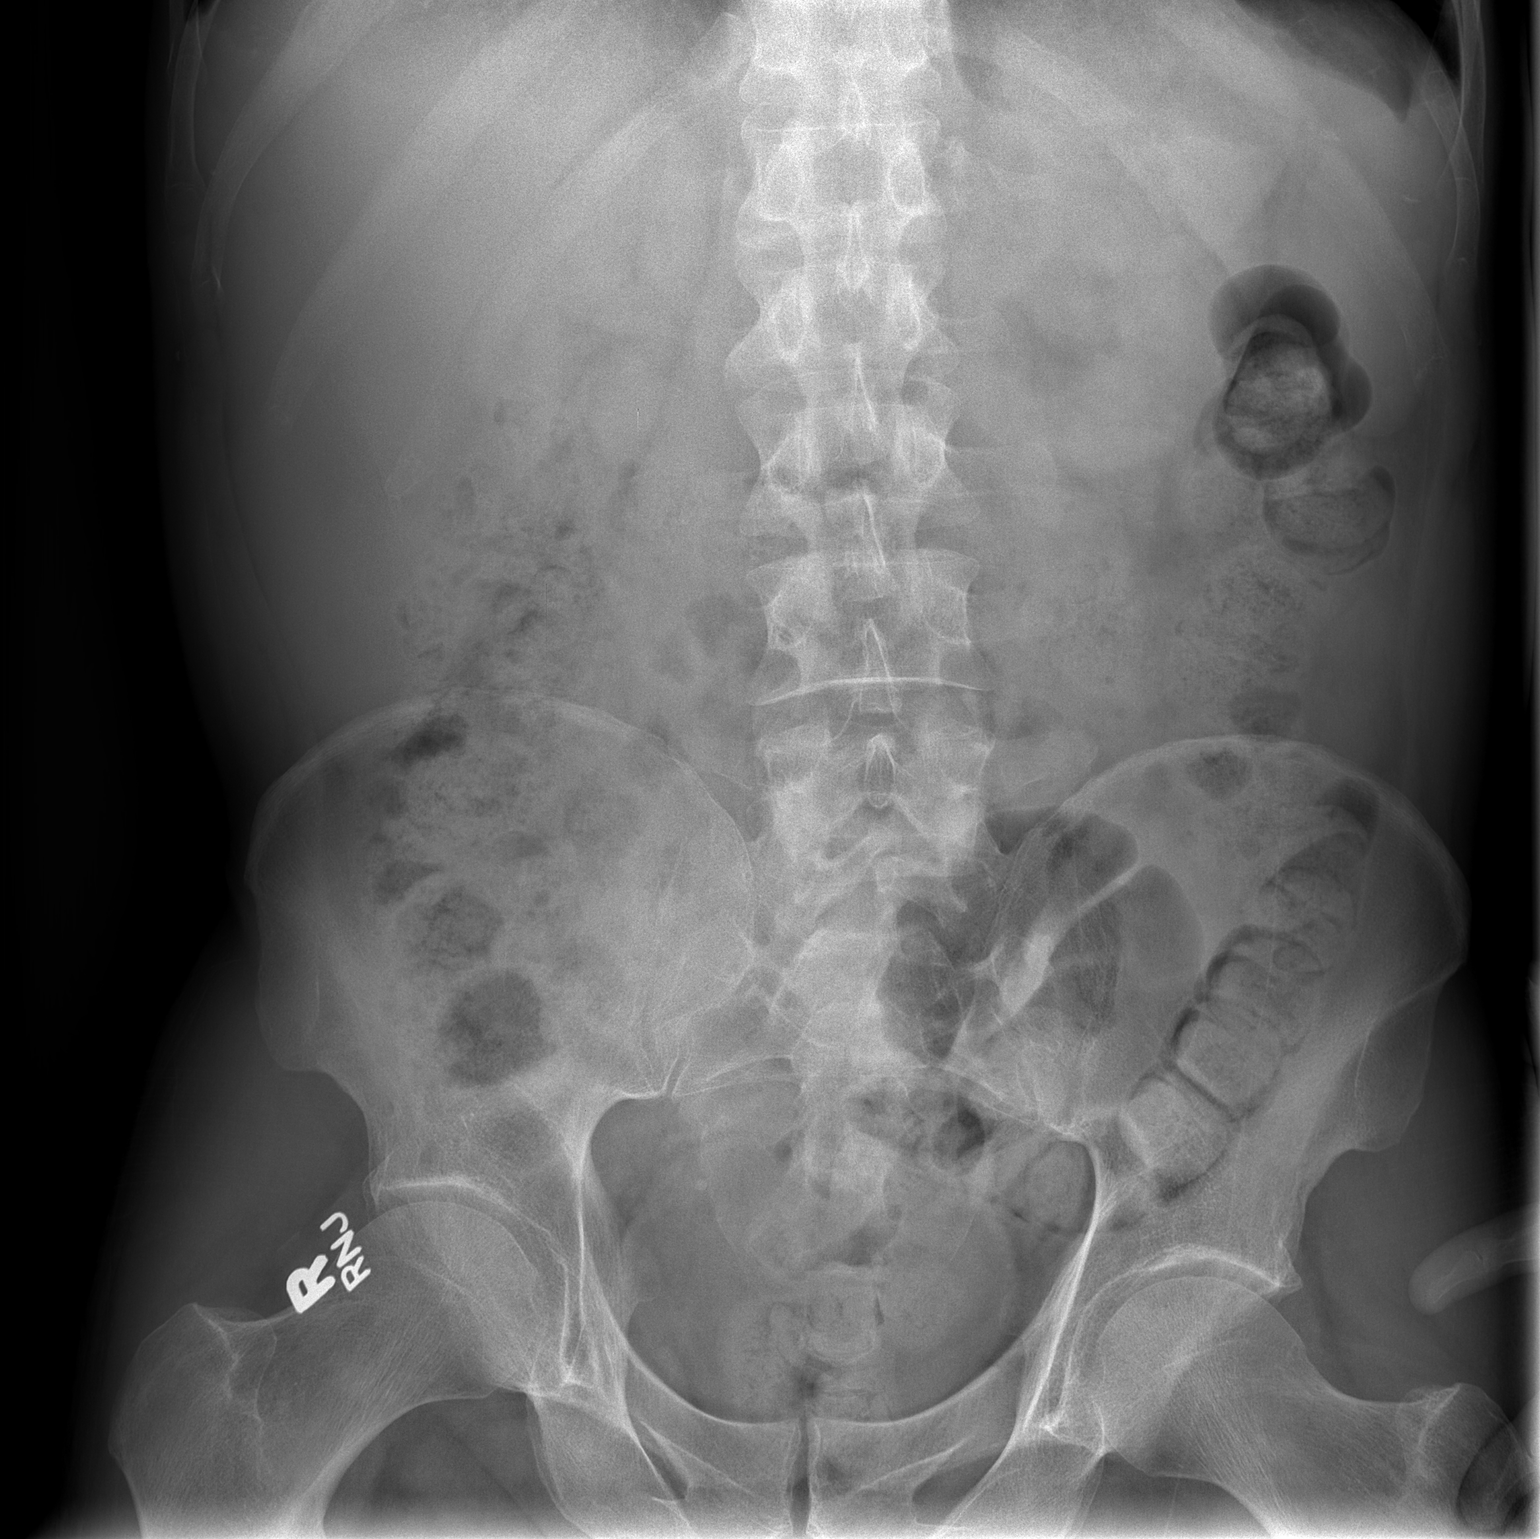

[t abdomen supine (2 of 2)]
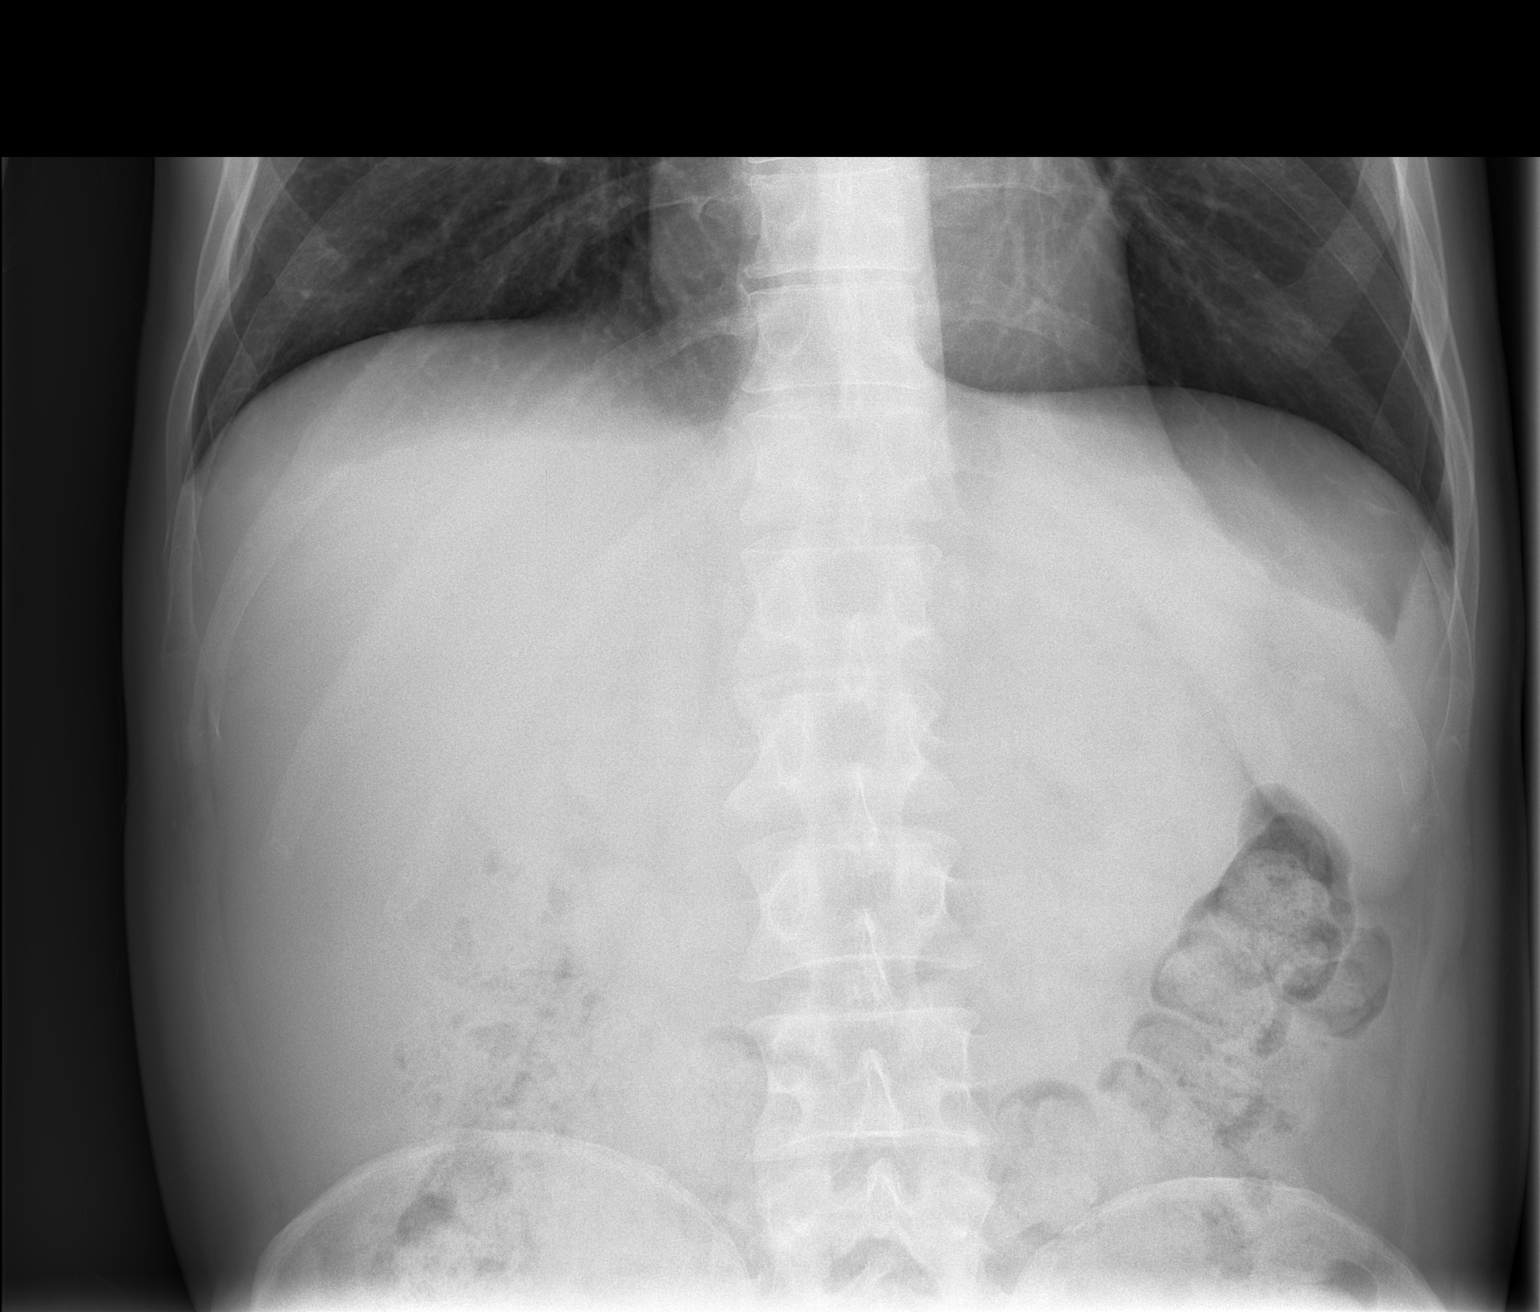

[2 of 2 positions shown; findings below may reference images not displayed]

FINDINGS: Increased colonic stool is seen since prior exam.  There
is no evidence of dilated bowel loops.  There is no evidence of
free air.  No radiopaque calculi are identified.
IMPRESSION: Increased colonic stool since prior exam; recommend clinical
correlation for constipation.

## 2008-12-22 IMAGING — CR DG ABDOMEN ACUTE W/ 1V CHEST
3 series · 3 of 3 positions shown · non-contrast
Comparison: 06/07/2008

CLINICAL DATA: Abdominal pain

ACUTE ABDOMEN SERIES (ABDOMEN 2 VIEW & CHEST 1 VIEW)

[w chest pa]
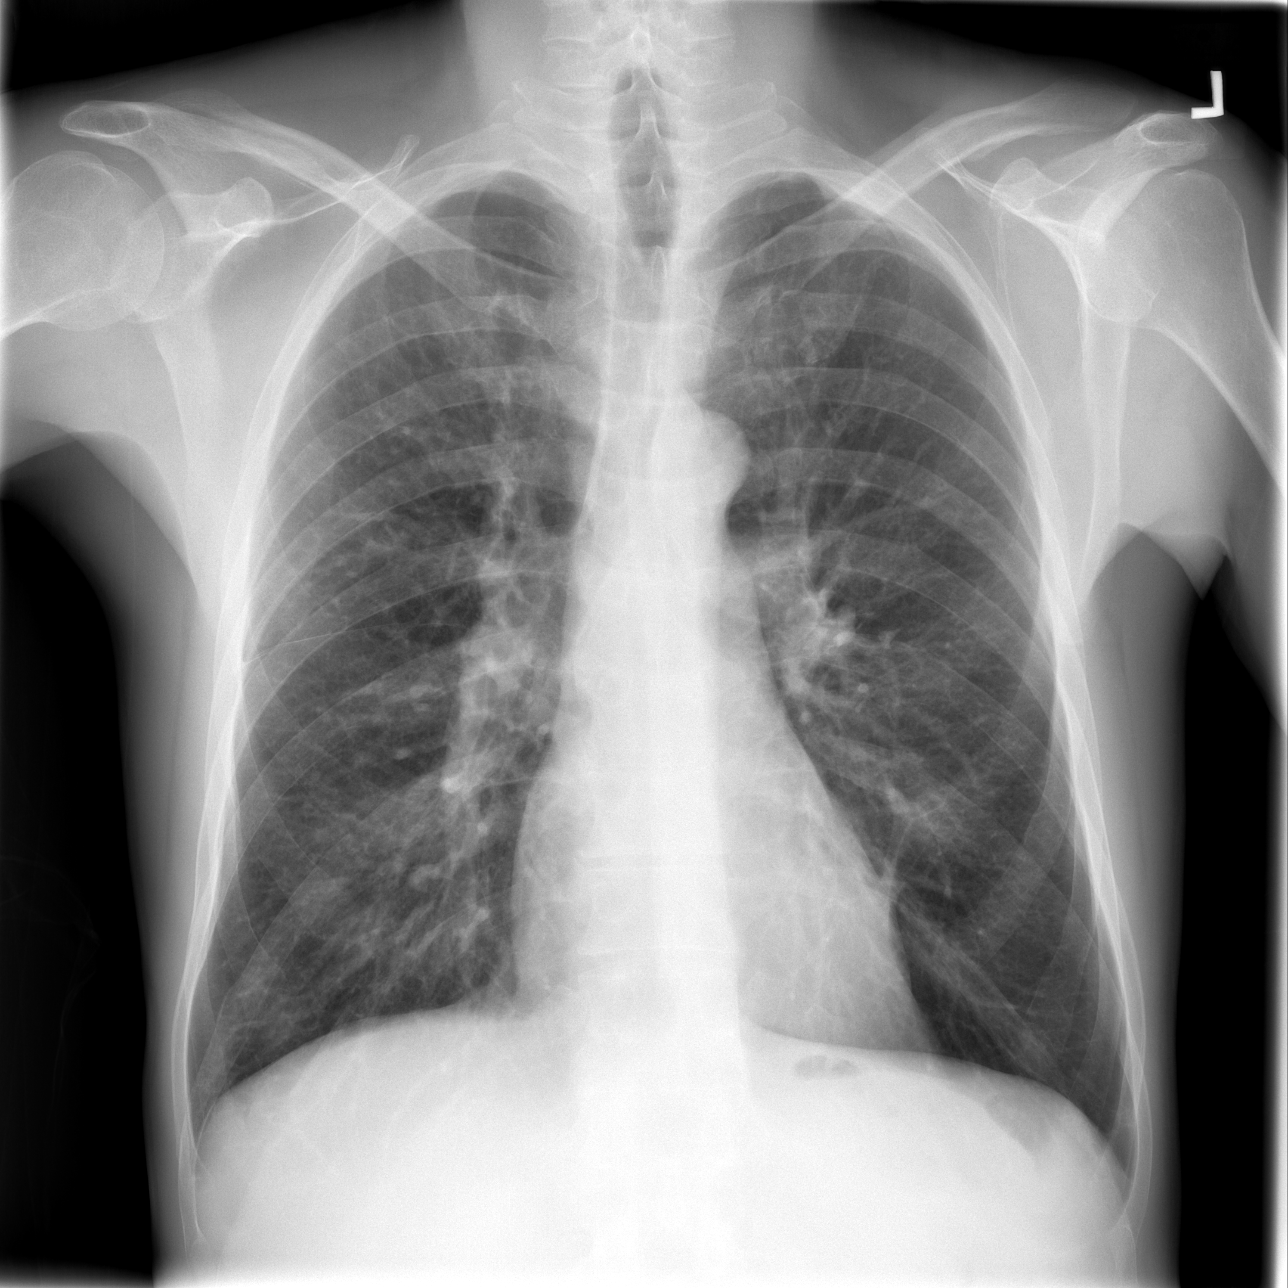

[w abdomen upright]
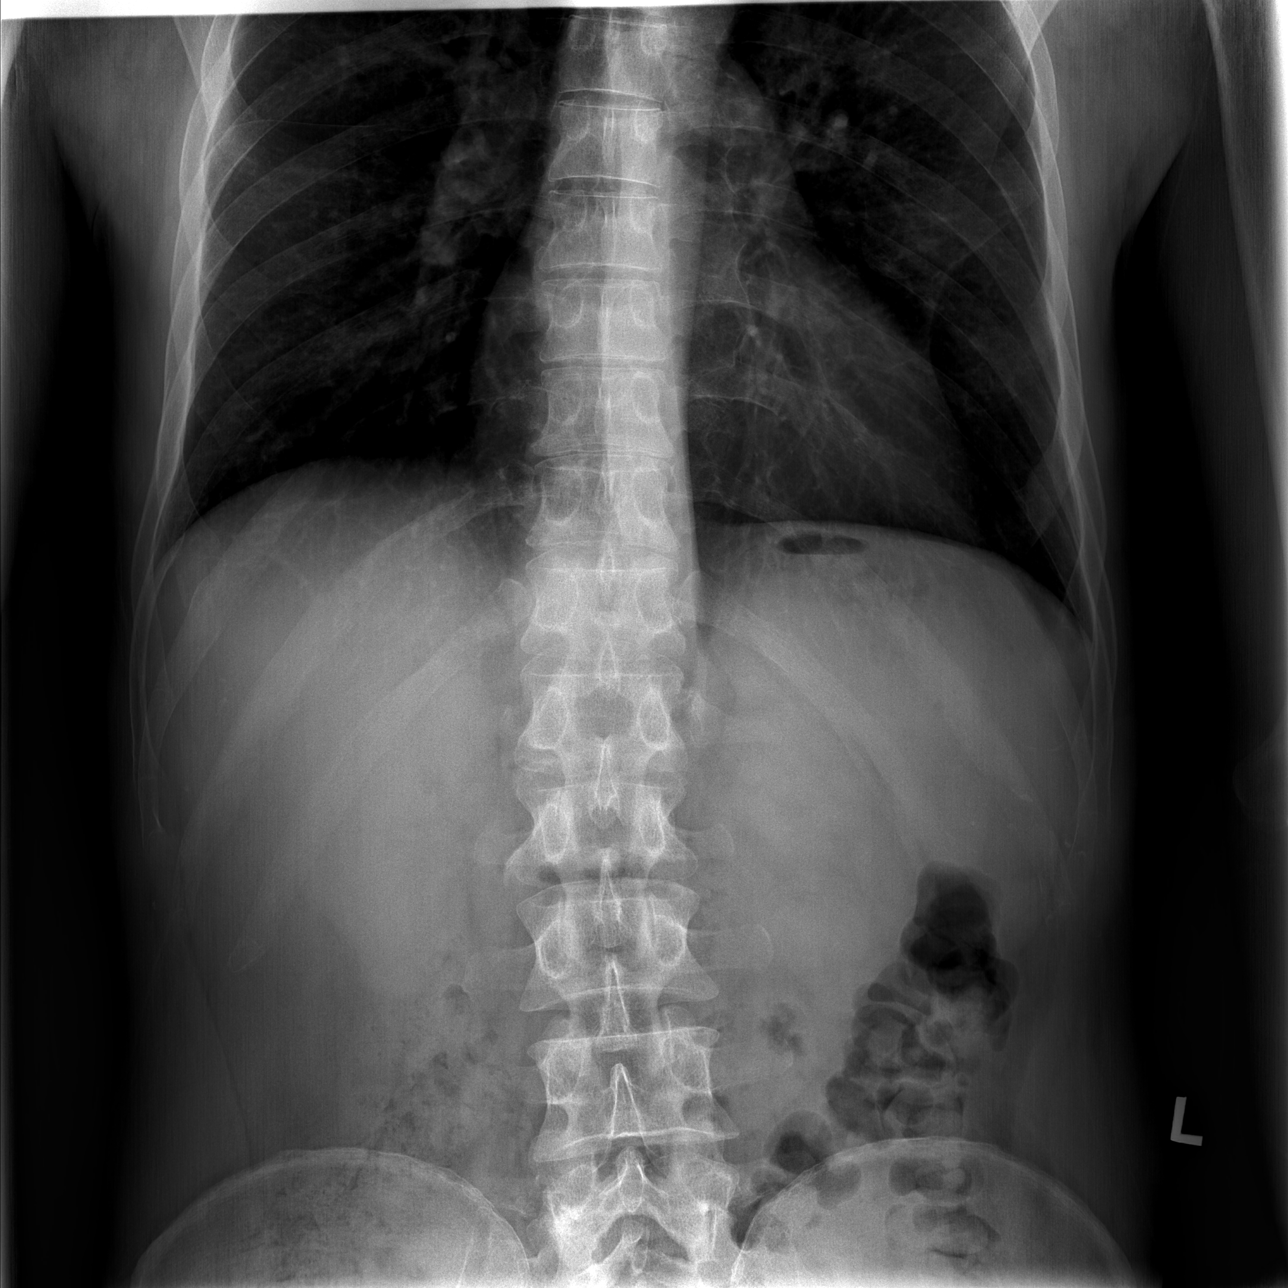

[t abdomen supine]
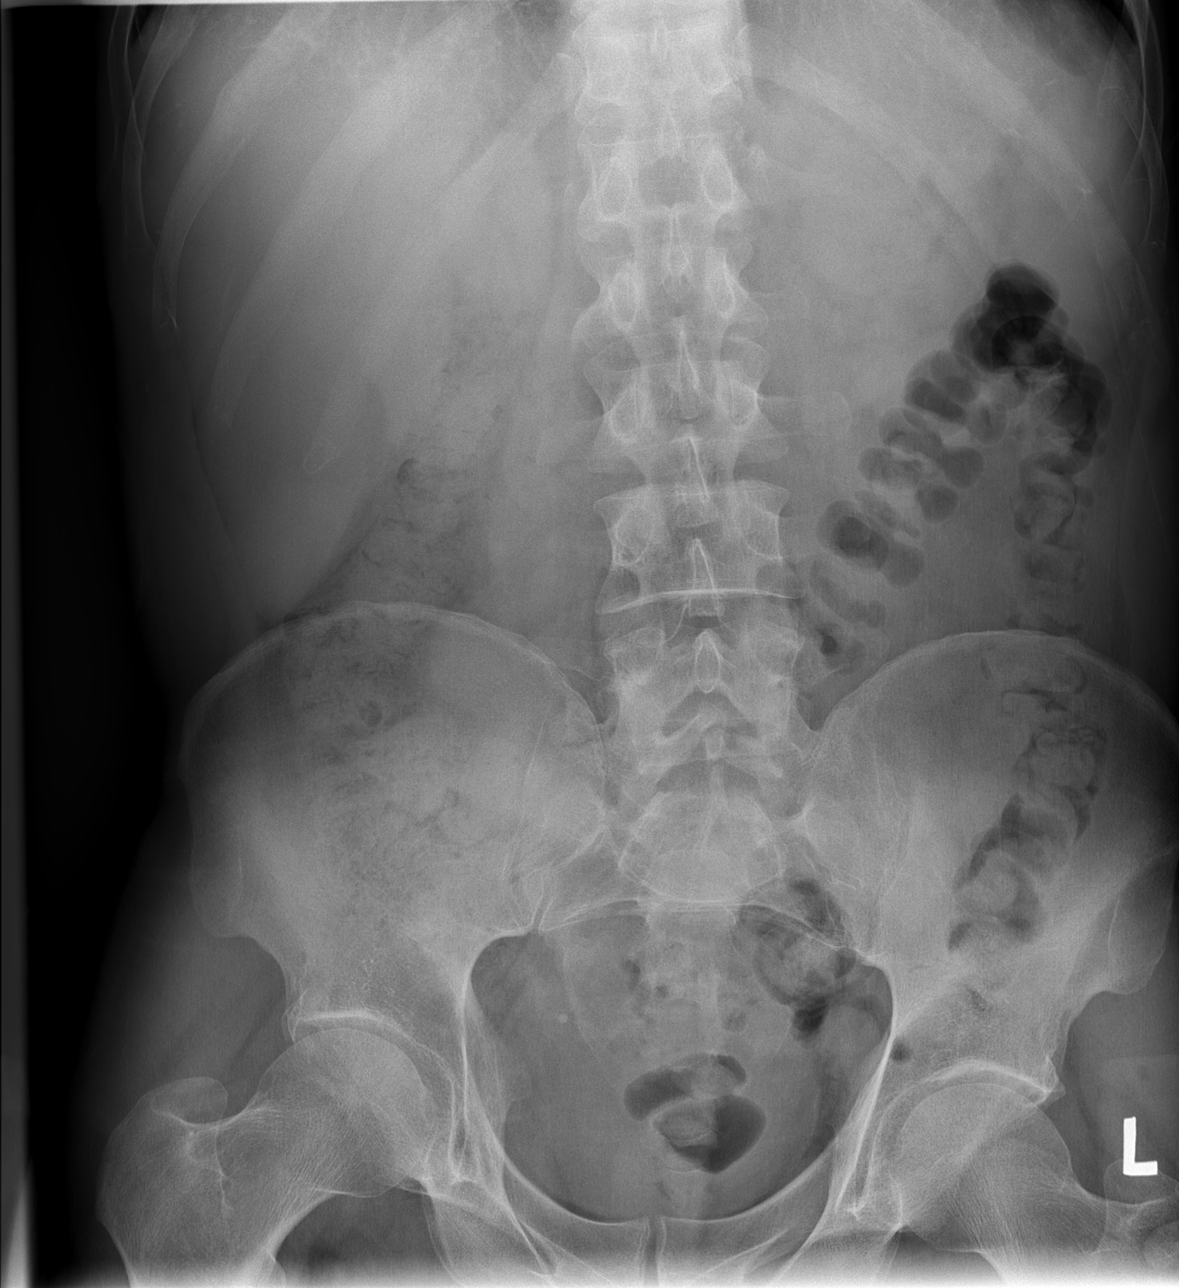

[3 of 3 positions shown; findings below may reference images not displayed]

FINDINGS: Chronic lung changes without active disease.  Heart and
mediastinal contours normal.

No free air or acute/specific abnormality of the bowel gas pattern.
The tip of the liver is positioned at or below the iliac crest, but
this may be due to depressed diaphragm related to COPD.  Cannot
rule out hepatomegaly.

No pathological calcifications.  Osseous structures intact.
IMPRESSION: 1.  Chronic lung disease with no definite acute process.
2.  No acute or specific abdominal findings - cannot exclude
hepatomegaly.  See report.

## 2008-12-24 IMAGING — CT CT PELVIS W/O CM
2 of 4 series · 17 of 46 positions shown, 19 images · non-contrast
Comparison: 06/07/2008.

CT ABDOMEN

CLINICAL DATA: Left flank pain.  History of pancreatitis and renal
insufficiency.

CT OF THE ABDOMEN AND PELVIS WITHOUT CONTRAST (CT UROGRAM)
TECHNIQUE: Multidetector CT imaging was performed through the
abdomen and pelvis to include the urinary tract.

[Series 2: stone_wo 5.0 b40f st · axial · 0.71mm/px · z∈[-458,-78]mm · 14 of 105 slices shown, 16 images]
[im 5/105  soft-tissue]
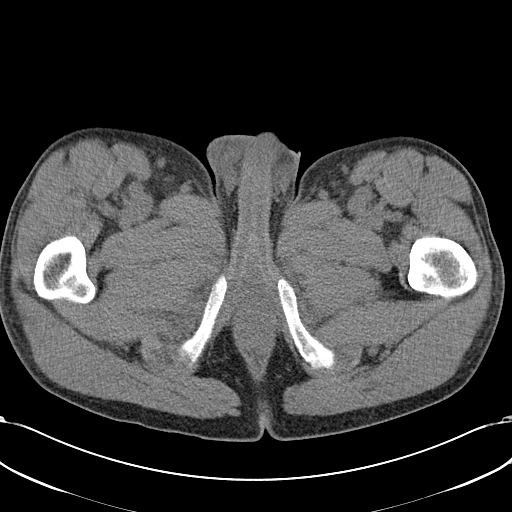
[im 5/105  bone]
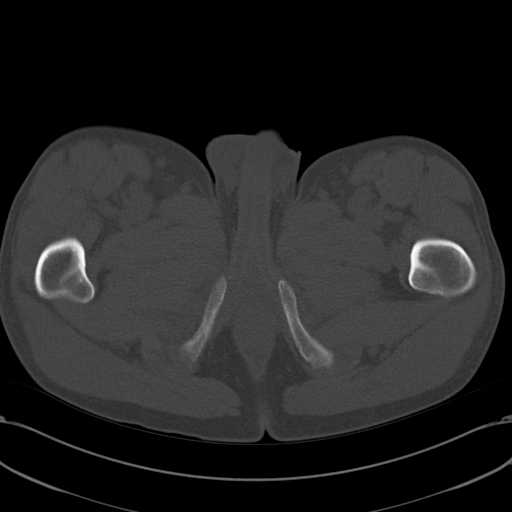
[im 14/105  soft-tissue]
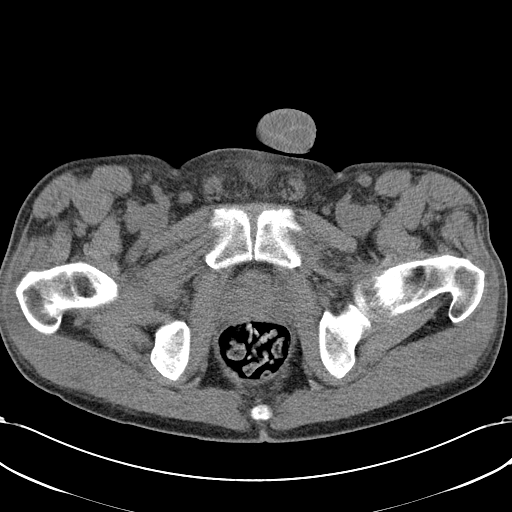
[im 22/105  soft-tissue]
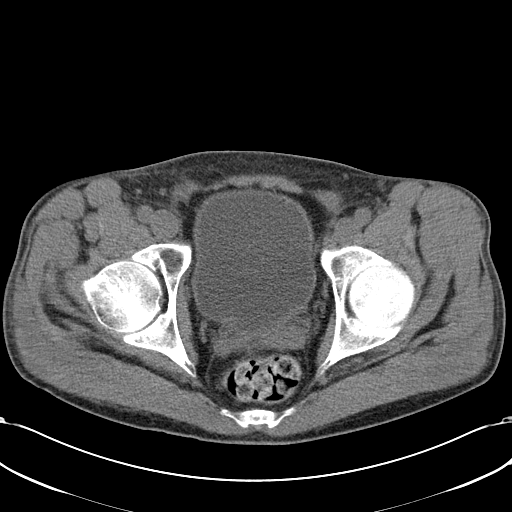
[im 27/105  soft-tissue]
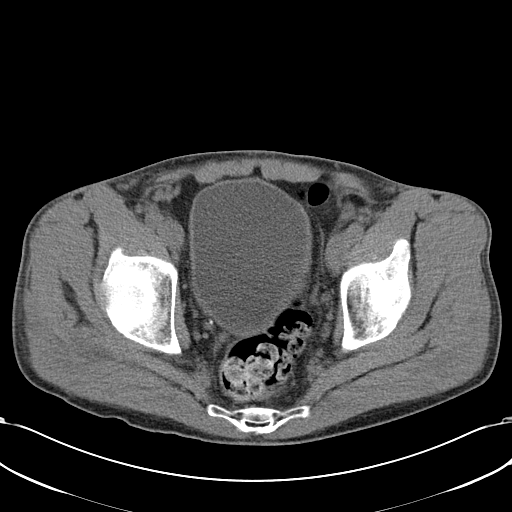
[im 35/105  soft-tissue]
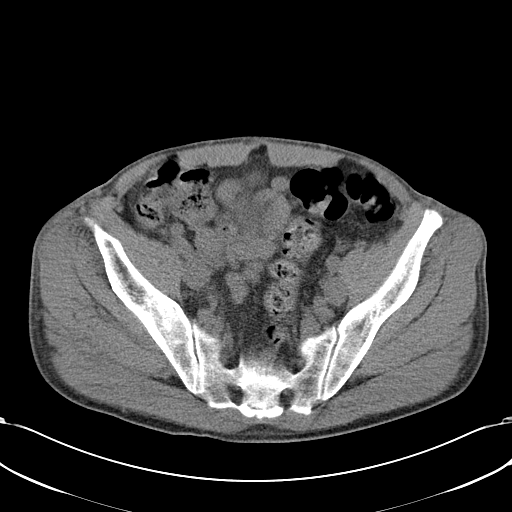
[im 44/105  soft-tissue]
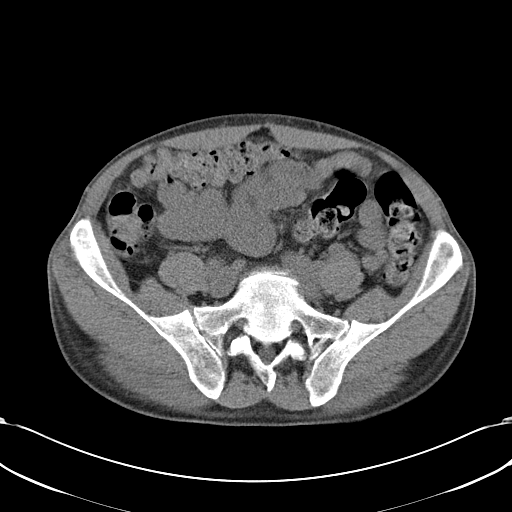
[im 48/105  soft-tissue]
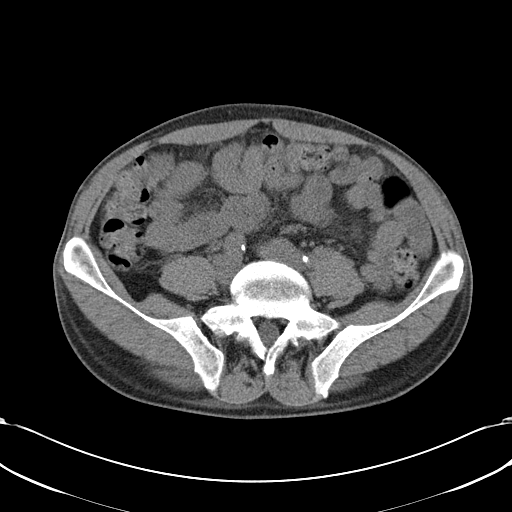
[im 57/105  soft-tissue]
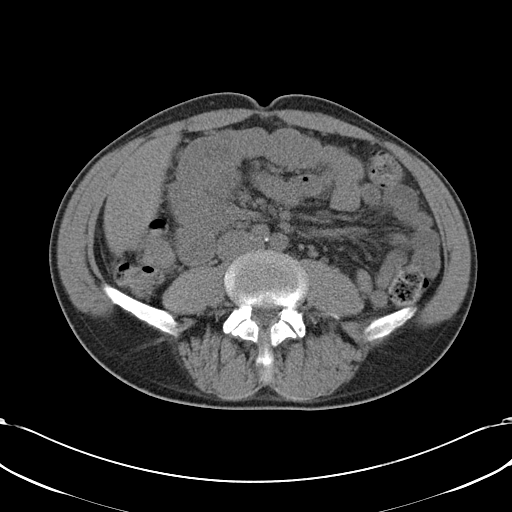
[im 61/105  soft-tissue]
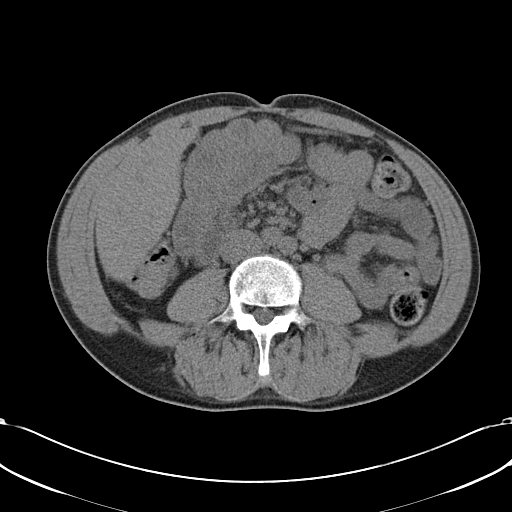
[im 61/105  bone]
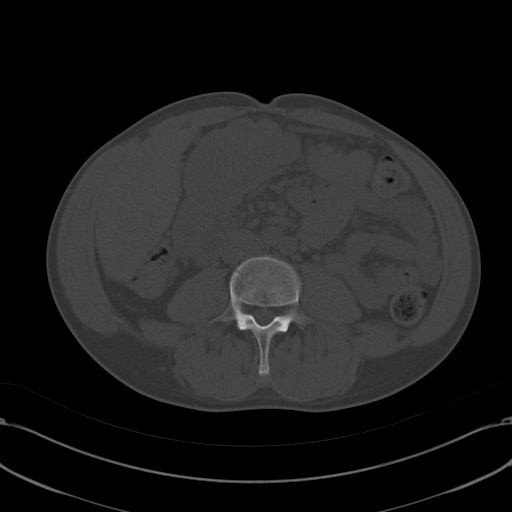
[im 70/105  soft-tissue]
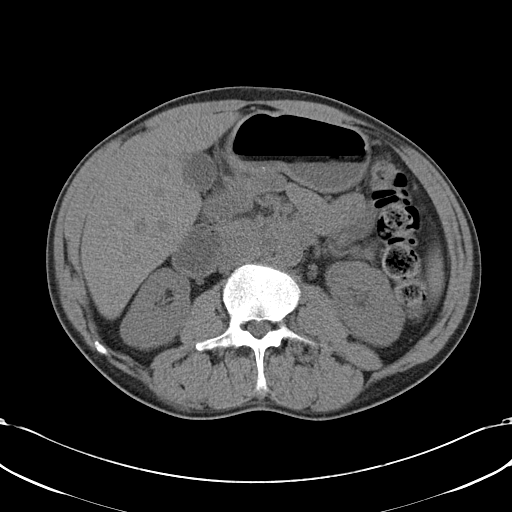
[im 79/105  soft-tissue]
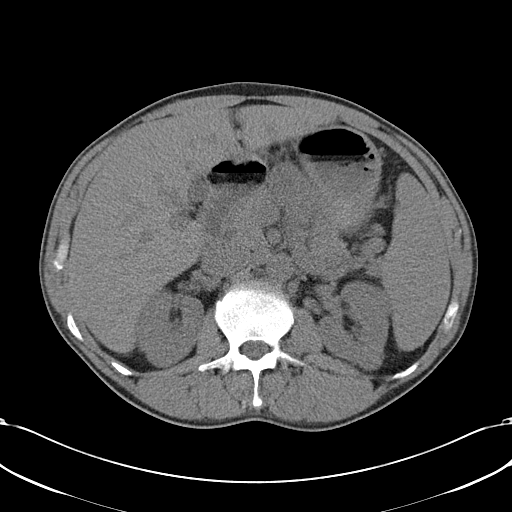
[im 83/105  soft-tissue]
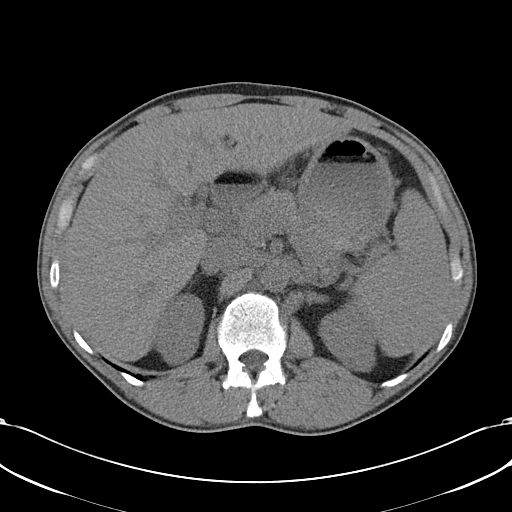
[im 92/105  soft-tissue]
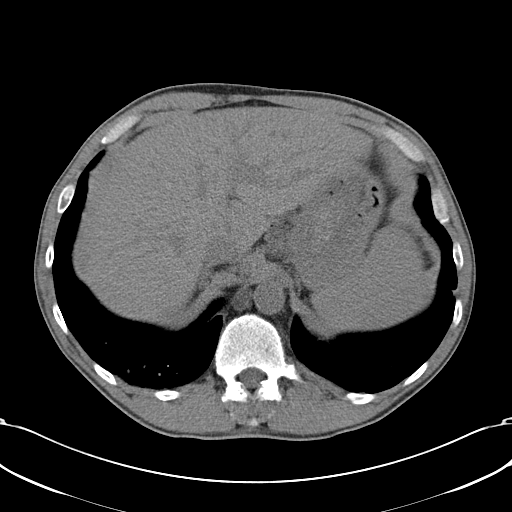
[im 100/105  soft-tissue]
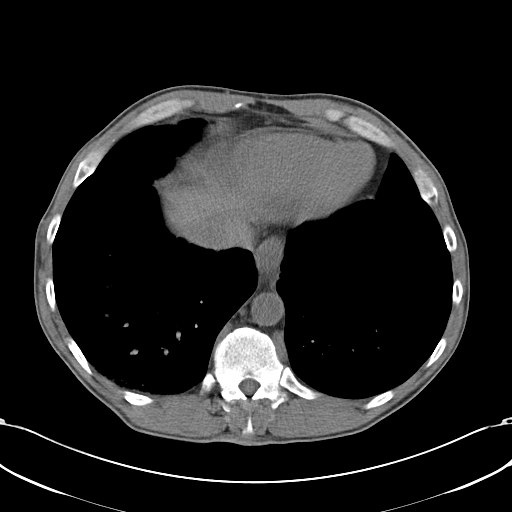

[Series 602: coronal · coronal · 0.86mm/px · 3 of 78 slices shown]
[im 26/78  soft-tissue]
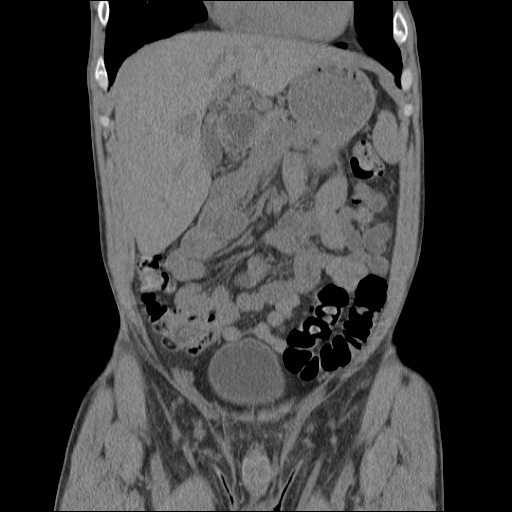
[im 35/78  soft-tissue]
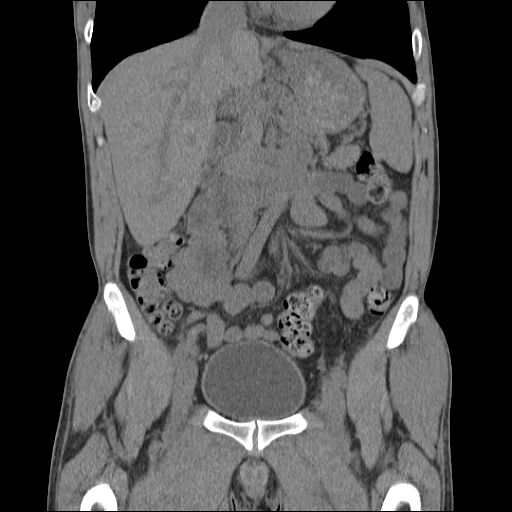
[im 43/78  soft-tissue]
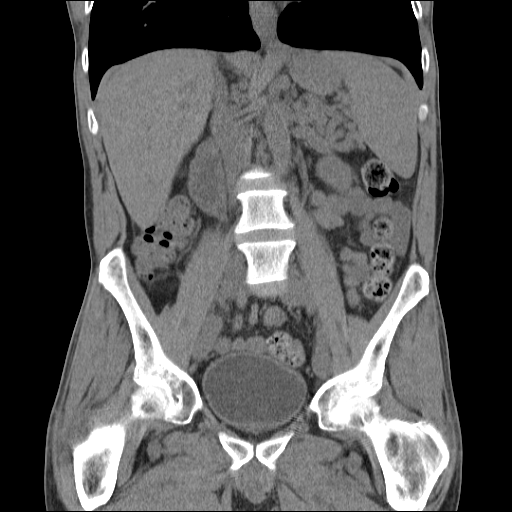

[17 of 46 positions shown; findings below may reference images not displayed]

FINDINGS: No renal or proximal ureteral stones.  No
hydronephrosis.  Solid organs have an unremarkable and unchanged
unenhanced appearance.  Prominent vascular structures again seen in
the upper abdomen, question varices related either to liver disease
or possible splenic vein thrombosis.

Gallbladder and bowel grossly unremarkable.  No free fluid, free
air, or adenopathy.  Aorta is normal caliber.

Minimal dependent atelectasis in the right lung base.  No
effusions.
IMPRESSION: No acute findings or change since recent study.  No hydronephrosis
or stones.

Prominent vessels in the upper abdomen, possible varices.  See
discussion above.

CT PELVIS
FINDINGS: Ureters are decompressed.  No stones.  Bladder,
prostate, seminal vesicles grossly unremarkable. Bowel grossly
unremarkable.  No free fluid, free air, or adenopathy. Borderline
sized right inguinal lymph nodes present, unchanged.
IMPRESSION: No acute findings in the pelvis.

## 2009-01-13 ENCOUNTER — Other Ambulatory Visit: Payer: Self-pay | Admitting: Emergency Medicine

## 2009-01-14 ENCOUNTER — Inpatient Hospital Stay (HOSPITAL_COMMUNITY): Admission: AD | Admit: 2009-01-14 | Discharge: 2009-01-18 | Payer: Self-pay | Admitting: Psychiatry

## 2009-01-14 ENCOUNTER — Other Ambulatory Visit: Payer: Self-pay | Admitting: Emergency Medicine

## 2009-01-14 ENCOUNTER — Ambulatory Visit: Payer: Self-pay | Admitting: Psychiatry

## 2009-01-19 ENCOUNTER — Inpatient Hospital Stay (HOSPITAL_COMMUNITY): Admission: EM | Admit: 2009-01-19 | Discharge: 2009-01-23 | Payer: Self-pay | Admitting: Emergency Medicine

## 2009-01-24 ENCOUNTER — Inpatient Hospital Stay (HOSPITAL_COMMUNITY): Admission: EM | Admit: 2009-01-24 | Discharge: 2009-02-01 | Payer: Self-pay | Admitting: Emergency Medicine

## 2009-01-31 ENCOUNTER — Encounter (INDEPENDENT_AMBULATORY_CARE_PROVIDER_SITE_OTHER): Payer: Self-pay | Admitting: Nephrology

## 2009-01-31 ENCOUNTER — Ambulatory Visit: Payer: Self-pay | Admitting: Vascular Surgery

## 2009-02-02 ENCOUNTER — Encounter: Payer: Self-pay | Admitting: Internal Medicine

## 2009-02-02 ENCOUNTER — Ambulatory Visit: Payer: Self-pay | Admitting: Internal Medicine

## 2009-02-02 DIAGNOSIS — R1084 Generalized abdominal pain: Secondary | ICD-10-CM

## 2009-02-02 LAB — CONVERTED CEMR LAB
Alkaline Phosphatase: 72 units/L (ref 39–117)
BUN: 41 mg/dL — ABNORMAL HIGH (ref 6–23)
Basophils Relative: 1 % (ref 0–1)
Eosinophils Absolute: 0.1 10*3/uL (ref 0.0–0.7)
Glucose, Bld: 89 mg/dL (ref 70–99)
Hemoglobin: 10.4 g/dL — ABNORMAL LOW (ref 13.0–17.0)
MCHC: 34.8 g/dL (ref 30.0–36.0)
MCV: 88.2 fL (ref 78.0–100.0)
Monocytes Absolute: 0.3 10*3/uL (ref 0.1–1.0)
Monocytes Relative: 11 % (ref 3–12)
RBC: 3.39 M/uL — ABNORMAL LOW (ref 4.22–5.81)
Sodium: 143 meq/L (ref 135–145)
Total Bilirubin: 0.6 mg/dL (ref 0.3–1.2)
Total Protein: 7.7 g/dL (ref 6.0–8.3)

## 2009-02-03 DIAGNOSIS — D61818 Other pancytopenia: Secondary | ICD-10-CM

## 2009-02-03 DIAGNOSIS — Z992 Dependence on renal dialysis: Secondary | ICD-10-CM

## 2009-02-03 DIAGNOSIS — F1910 Other psychoactive substance abuse, uncomplicated: Secondary | ICD-10-CM | POA: Insufficient documentation

## 2009-02-03 DIAGNOSIS — I10 Essential (primary) hypertension: Secondary | ICD-10-CM | POA: Insufficient documentation

## 2009-02-03 DIAGNOSIS — N186 End stage renal disease: Secondary | ICD-10-CM

## 2009-02-09 ENCOUNTER — Ambulatory Visit: Payer: Self-pay | Admitting: Internal Medicine

## 2009-02-09 ENCOUNTER — Encounter: Payer: Self-pay | Admitting: Internal Medicine

## 2009-02-09 LAB — CONVERTED CEMR LAB
BUN: 58 mg/dL — ABNORMAL HIGH (ref 6–23)
CO2: 17 meq/L — ABNORMAL LOW (ref 19–32)
Chloride: 109 meq/L (ref 96–112)
Creatinine, Ser: 4.96 mg/dL — ABNORMAL HIGH (ref 0.40–1.50)

## 2009-02-10 ENCOUNTER — Encounter: Payer: Self-pay | Admitting: Internal Medicine

## 2009-02-13 ENCOUNTER — Encounter: Payer: Self-pay | Admitting: Internal Medicine

## 2009-02-16 ENCOUNTER — Ambulatory Visit (HOSPITAL_COMMUNITY): Admission: RE | Admit: 2009-02-16 | Discharge: 2009-02-16 | Payer: Self-pay | Admitting: Vascular Surgery

## 2009-02-20 ENCOUNTER — Encounter: Payer: Self-pay | Admitting: Internal Medicine

## 2009-02-27 ENCOUNTER — Encounter: Payer: Self-pay | Admitting: Internal Medicine

## 2009-03-03 ENCOUNTER — Encounter: Payer: Self-pay | Admitting: Internal Medicine

## 2009-03-06 ENCOUNTER — Telehealth: Payer: Self-pay | Admitting: Internal Medicine

## 2009-03-07 ENCOUNTER — Telehealth: Payer: Self-pay | Admitting: Internal Medicine

## 2009-03-07 ENCOUNTER — Encounter: Payer: Self-pay | Admitting: Licensed Clinical Social Worker

## 2009-03-08 ENCOUNTER — Telehealth: Payer: Self-pay | Admitting: Internal Medicine

## 2009-03-08 ENCOUNTER — Encounter: Payer: Self-pay | Admitting: Internal Medicine

## 2009-03-09 ENCOUNTER — Ambulatory Visit (HOSPITAL_COMMUNITY): Admission: RE | Admit: 2009-03-09 | Discharge: 2009-03-09 | Payer: Self-pay | Admitting: Internal Medicine

## 2009-03-09 ENCOUNTER — Ambulatory Visit: Payer: Self-pay | Admitting: Internal Medicine

## 2009-03-21 ENCOUNTER — Telehealth: Payer: Self-pay | Admitting: *Deleted

## 2009-03-22 ENCOUNTER — Ambulatory Visit: Payer: Self-pay | Admitting: Vascular Surgery

## 2009-03-23 ENCOUNTER — Encounter: Payer: Self-pay | Admitting: Internal Medicine

## 2009-03-30 ENCOUNTER — Encounter: Payer: Self-pay | Admitting: Internal Medicine

## 2009-04-03 ENCOUNTER — Encounter: Payer: Self-pay | Admitting: Internal Medicine

## 2009-04-18 ENCOUNTER — Telehealth (INDEPENDENT_AMBULATORY_CARE_PROVIDER_SITE_OTHER): Payer: Self-pay | Admitting: *Deleted

## 2009-04-21 ENCOUNTER — Encounter: Payer: Self-pay | Admitting: Internal Medicine

## 2009-06-09 ENCOUNTER — Ambulatory Visit (HOSPITAL_COMMUNITY): Admission: RE | Admit: 2009-06-09 | Discharge: 2009-06-09 | Payer: Self-pay | Admitting: Nephrology

## 2009-07-19 ENCOUNTER — Ambulatory Visit: Payer: Self-pay | Admitting: Internal Medicine

## 2009-07-20 ENCOUNTER — Ambulatory Visit (HOSPITAL_COMMUNITY): Admission: RE | Admit: 2009-07-20 | Discharge: 2009-07-20 | Payer: Self-pay | Admitting: Nephrology

## 2009-07-27 IMAGING — CR DG CERVICAL SPINE COMPLETE 4+V
6 series · 6 of 6 positions shown · non-contrast
Comparison: None

CLINICAL DATA: Fall

CERVICAL SPINE - COMPLETE 4+ VIEW

[w c-spine lat *]
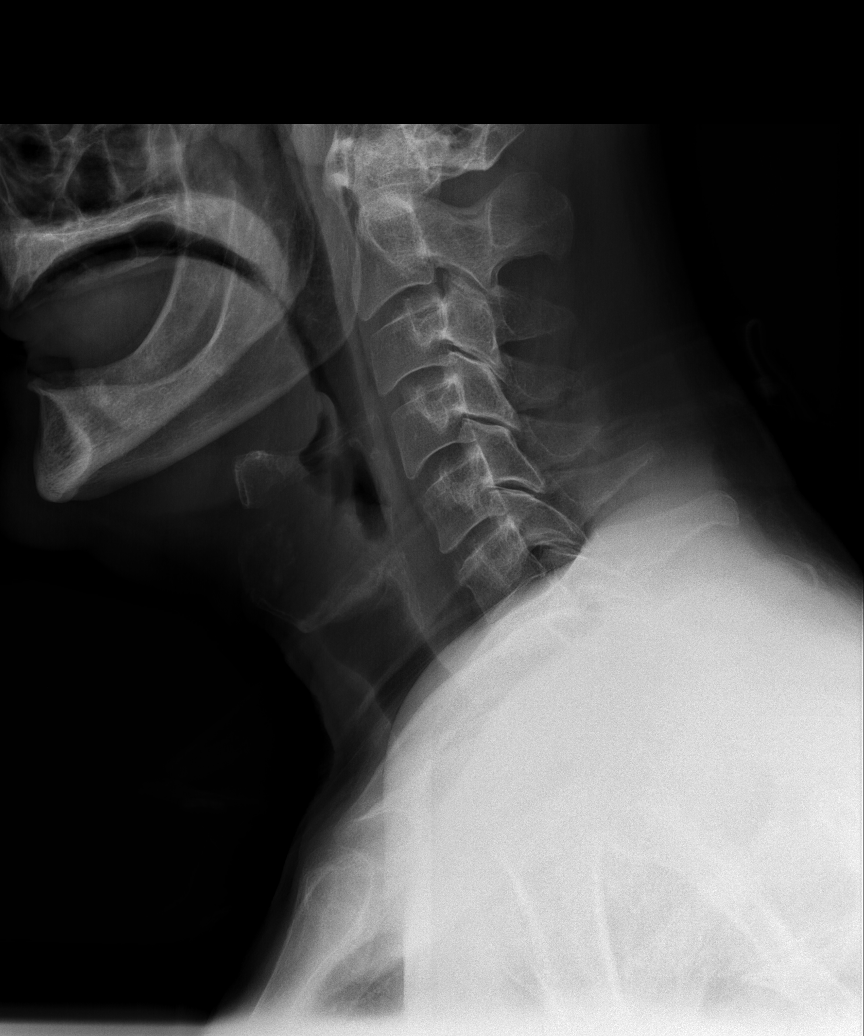

[w c-spine oblique (1 of 2)]
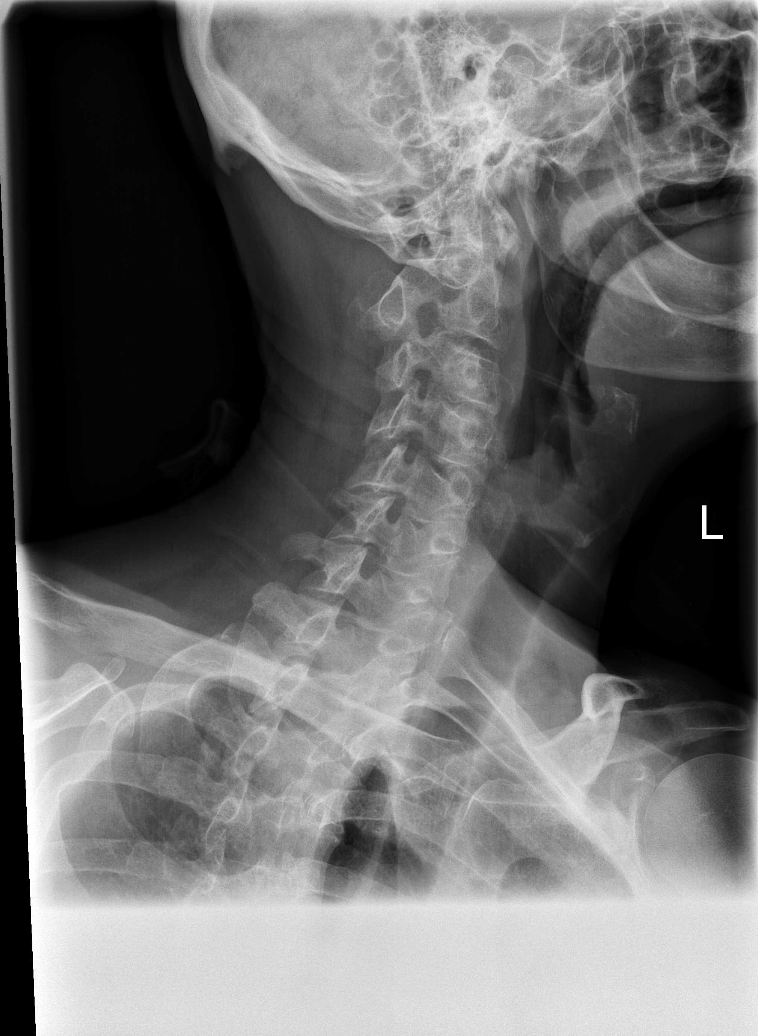

[w c-spine oblique (2 of 2)]
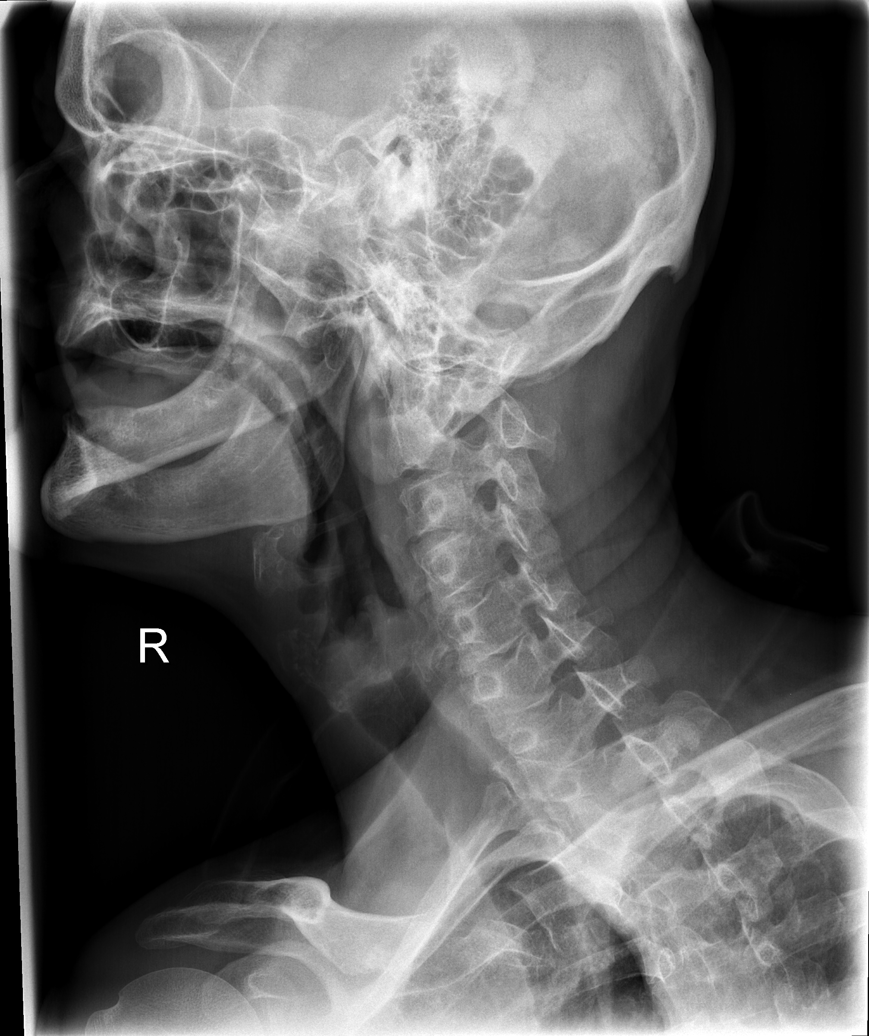

[w c-spine a.p. *]
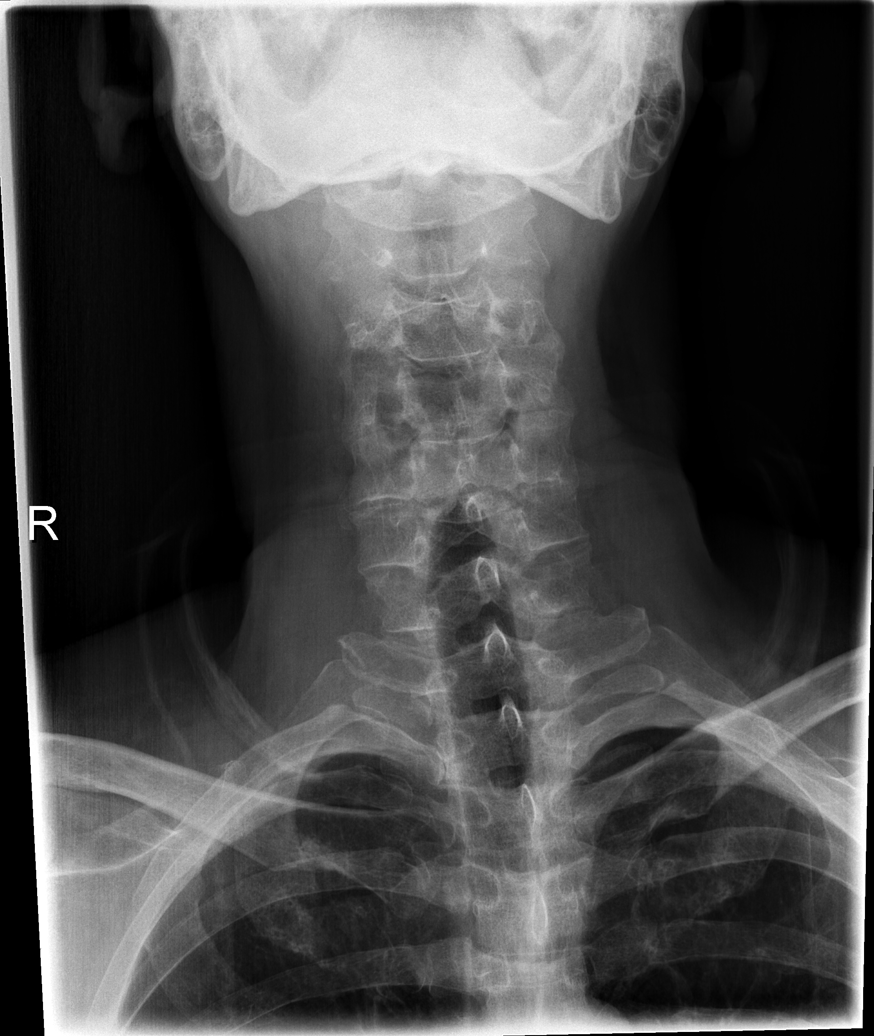

[w c-spine odontoid *]
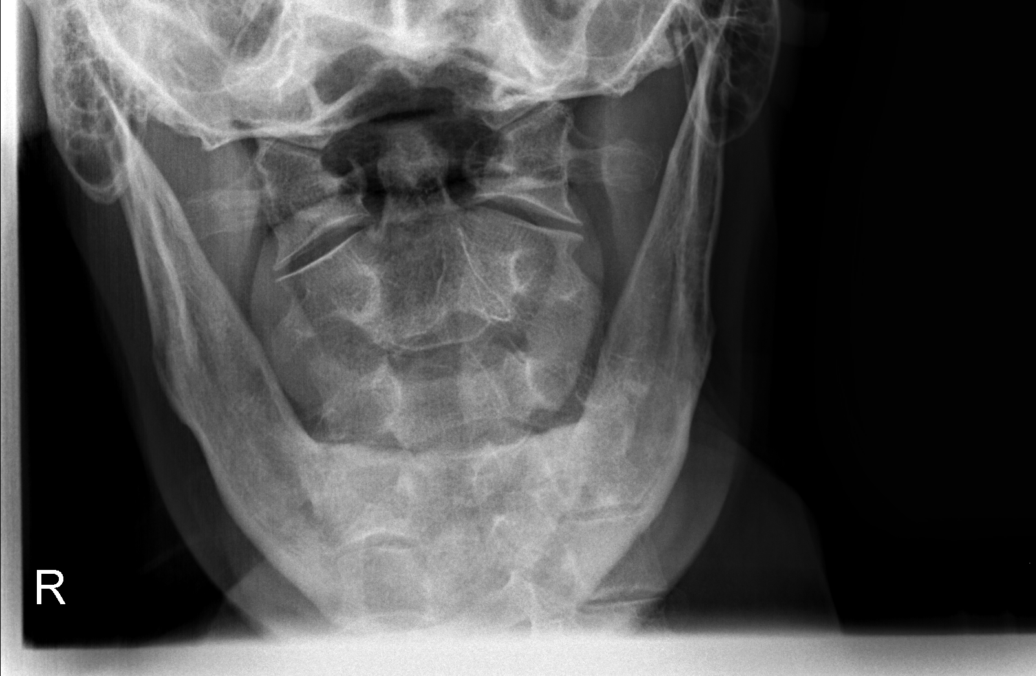

[w swimmers view *]
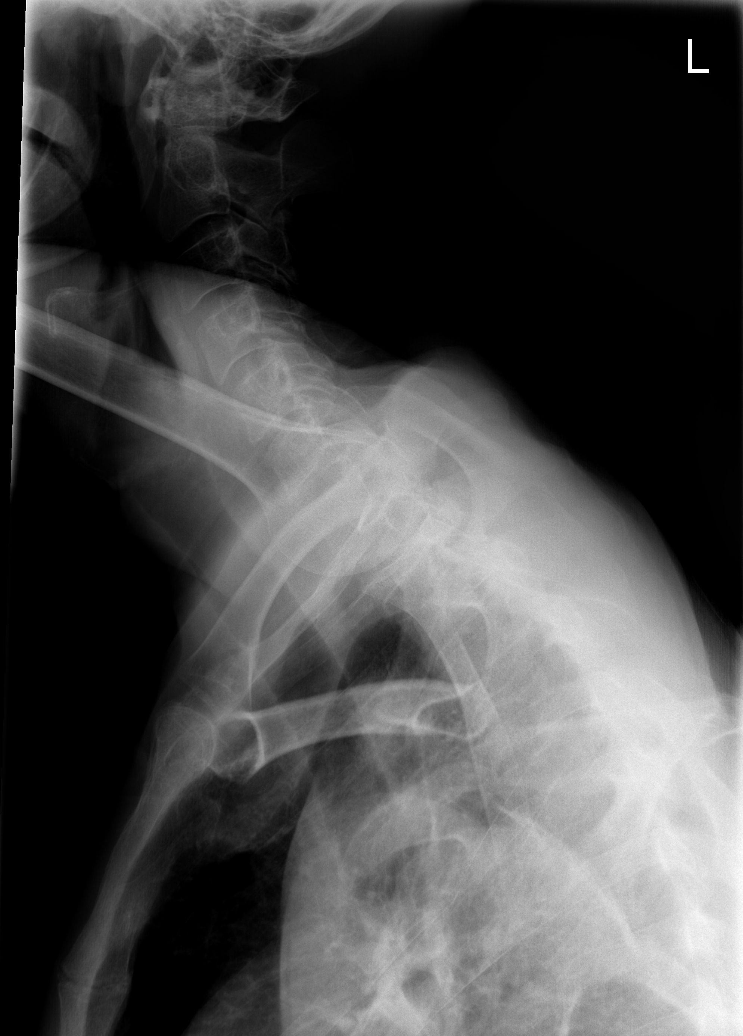

[6 of 6 positions shown; findings below may reference images not displayed]

FINDINGS: Abnormal lucency along the base of the dens.  While this
may reflect mach effect, a nondisplaced type 2 dens fracture is a
primary concern.  The prevertebral soft tissues are normal.  The
alignment is anatomic without listhesis.
IMPRESSION: Question dens fracture.  Follow-up CT is suggested.

## 2009-07-27 IMAGING — CT CT PELVIS W/O CM
1 of 2 series · 13 of 32 positions shown, 19 images · non-contrast
Comparison: 04/18/2008

CT CHEST

CLINICAL DATA: Fall.  Right-sided pain.

CT CHEST, ABDOMEN AND PELVIS WITHOUT CONTRAST
TECHNIQUE: Multidetector CT imaging of the chest, abdomen and
pelvis was performed following the standard protocol without IV
contrast.

[Series 2: cap w/o w/o st · axial · non-contrast · 0.61mm/px · z∈[-633,-58]mm · 13 of 133 slices shown, 19 images]
[im 9/133  soft-tissue]
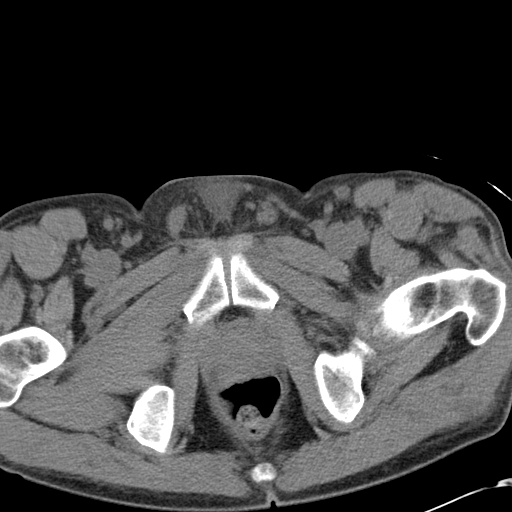
[im 9/133  bone]
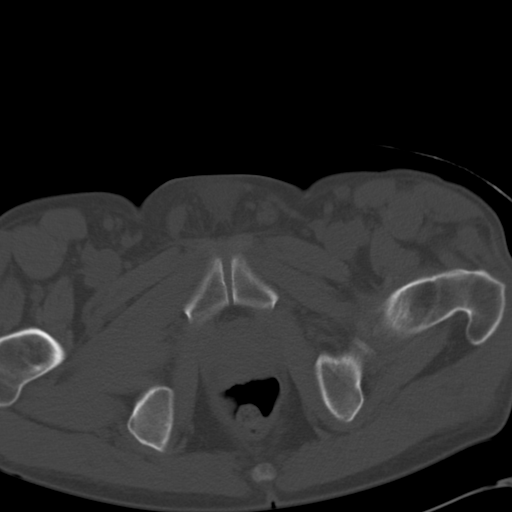
[im 18/133  soft-tissue]
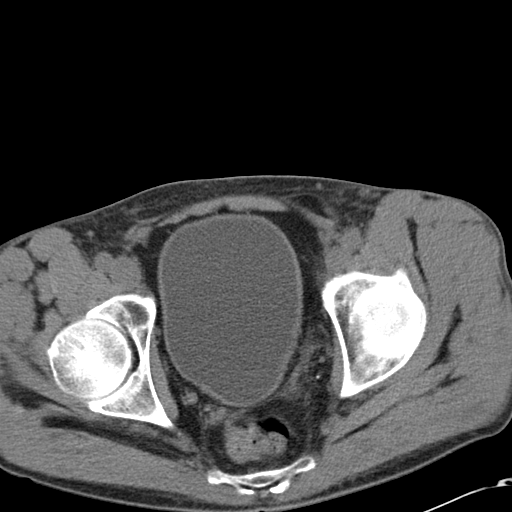
[im 27/133  soft-tissue]
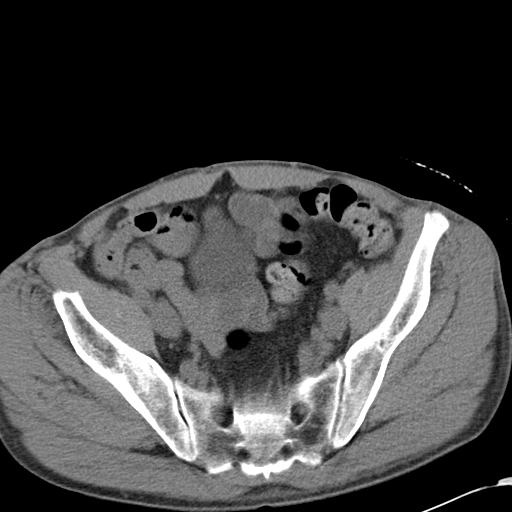
[im 36/133  soft-tissue]
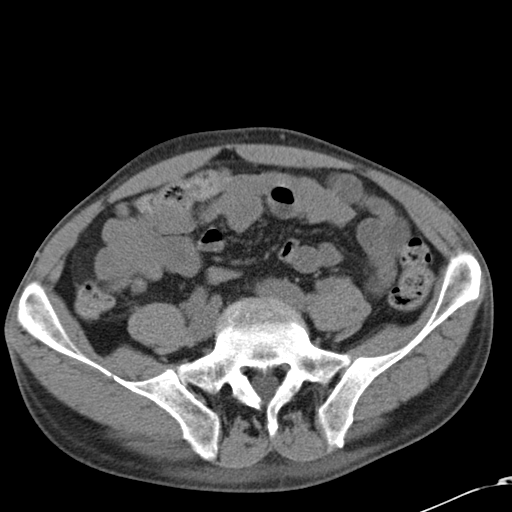
[im 45/133  soft-tissue]
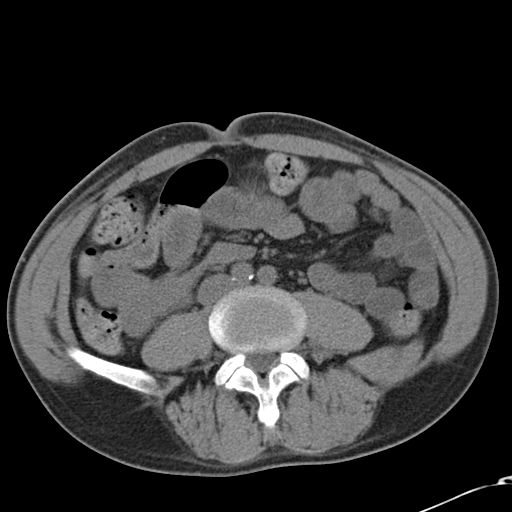
[im 53/133  soft-tissue]
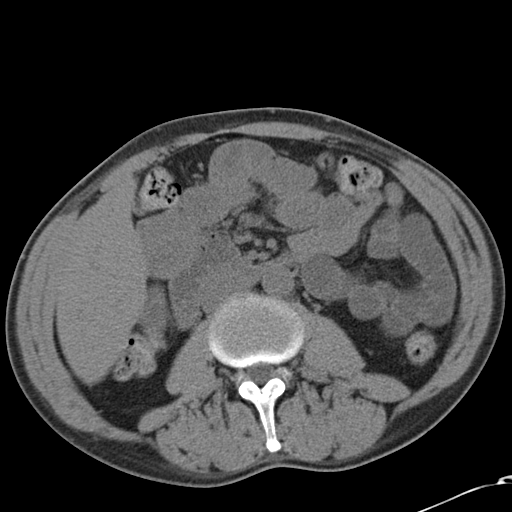
[im 71/133  soft-tissue]
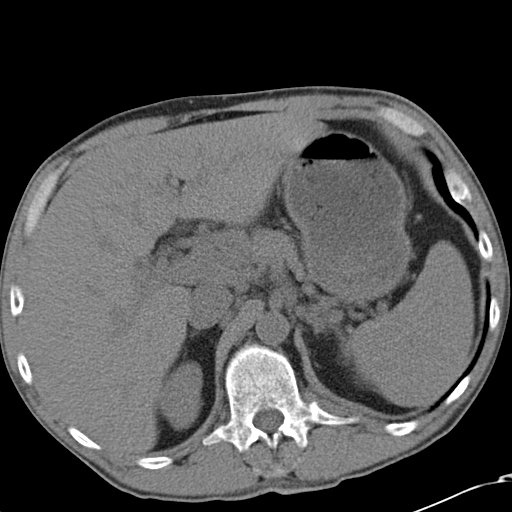
[im 80/133  soft-tissue]
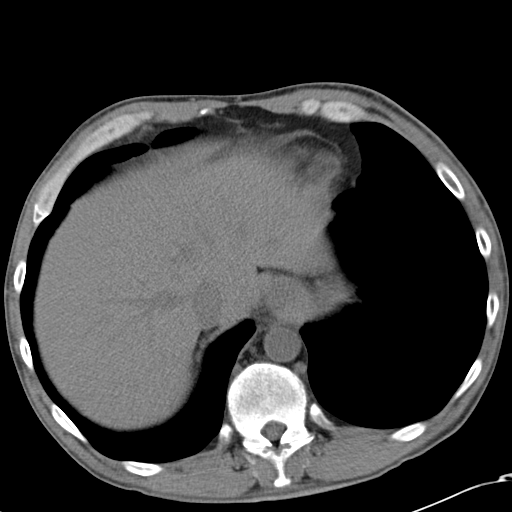
[im 89/133  soft-tissue]
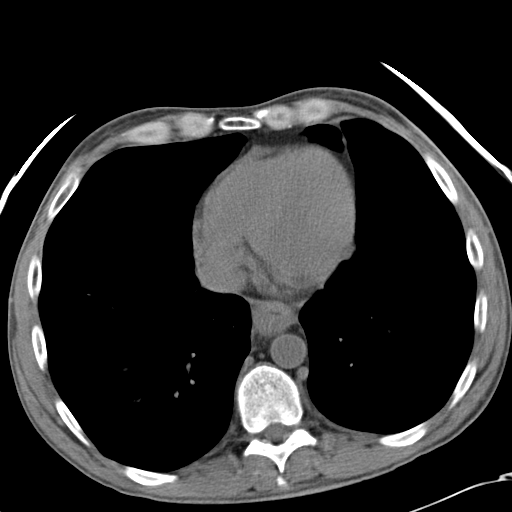
[im 89/133  bone]
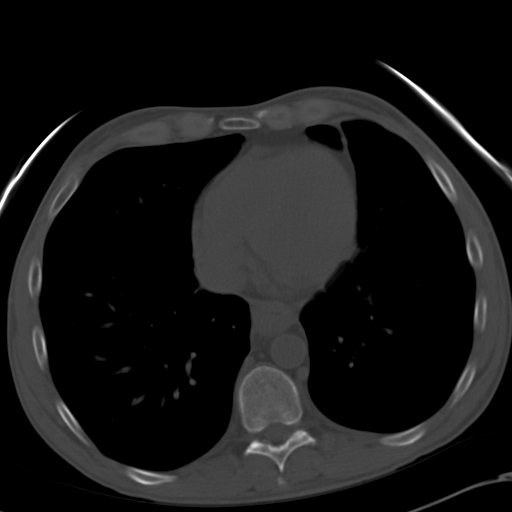
[im 97/133  soft-tissue]
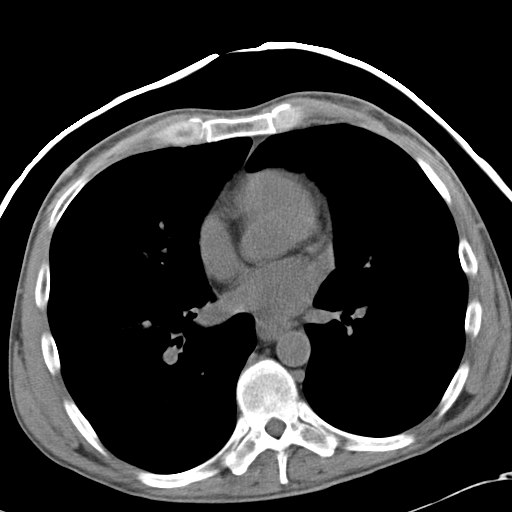
[im 97/133  lung]
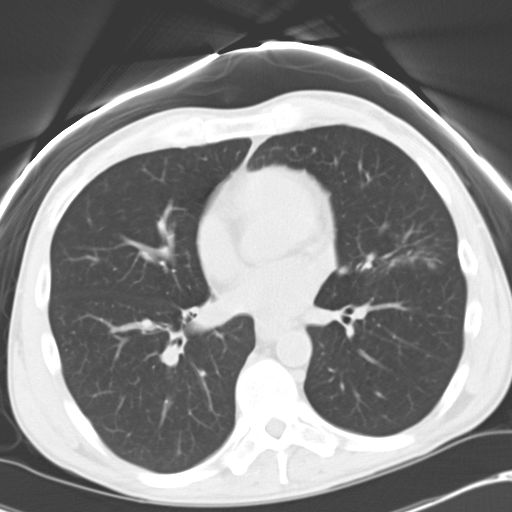
[im 106/133  soft-tissue]
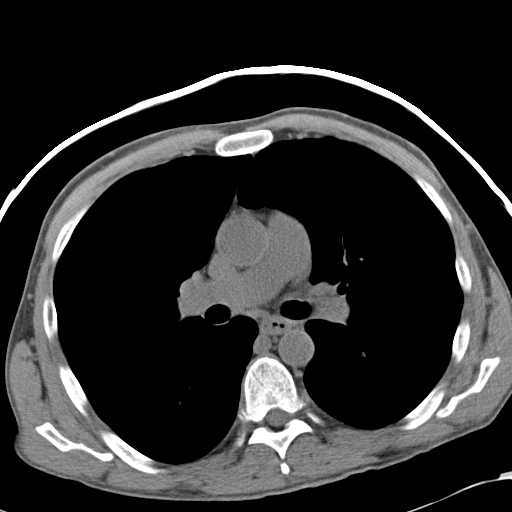
[im 106/133  lung]
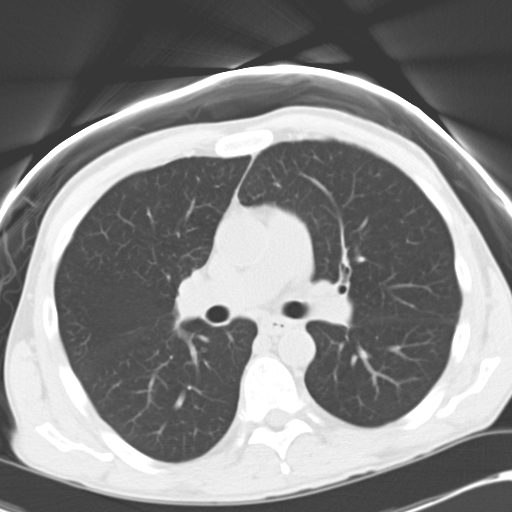
[im 115/133  soft-tissue]
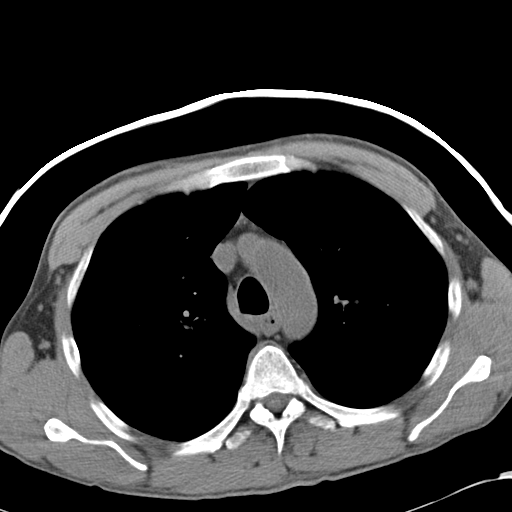
[im 115/133  lung]
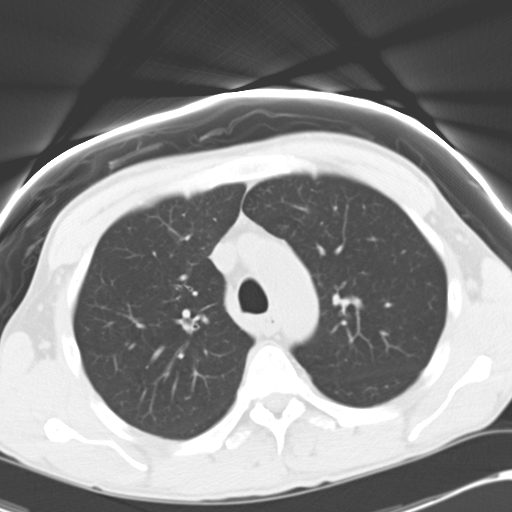
[im 124/133  soft-tissue]
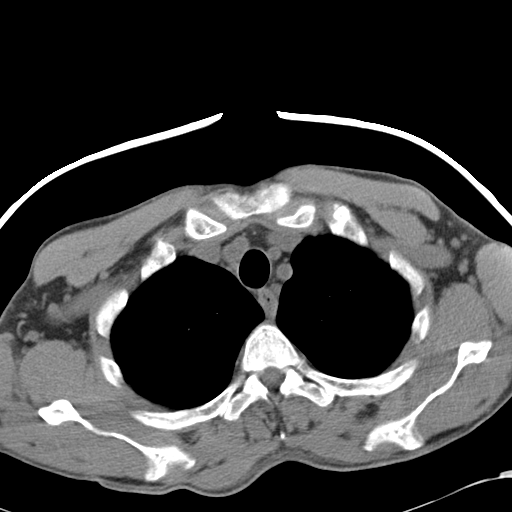
[im 124/133  lung]
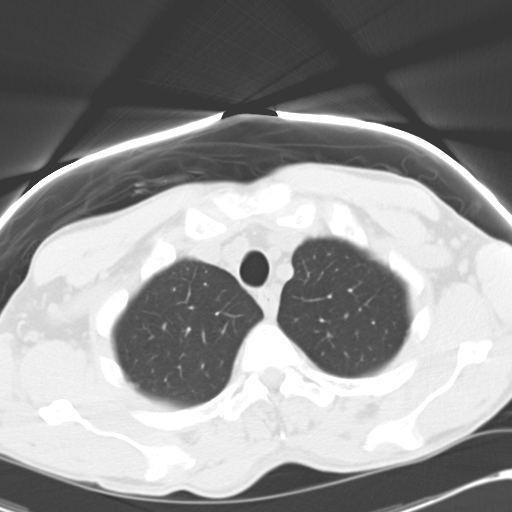

[13 of 32 positions shown; findings below may reference images not displayed]

FINDINGS: No pneumothorax pneumothorax.  A few benign-appearing
pulmonary densities are evident bilaterally, unchanged.  There is
an area of patchy density in the left lower lobe in the midportion
that was not seen previously.  This could represent atelectasis or
residual from previous pneumonia.  No rib fracture is seen.  No
spinal fractures seen.

No mediastinal or hilar adenopathy is evident.
IMPRESSION: No traumatic finding in the chest.  Few benign appearing pulmonary
densities are unchanged since the previous exams.  Area of patchy
density in the left lower lobe laterally could represent
atelectasis or residua of prior pneumonia.

CT ABDOMEN
FINDINGS: The liver appears normal.  No calcified gallstones.  The
spleen is normal.  The pancreas is normal.  The adrenal glands are
normal.  The contour deformity at the upper pole dorsally of the
left kidney appears the same as on previous examinations.  This
could be a lobulation or a benign tumor.  It appears stable going
back at least as far as November 2005.

The aorta and IVC are unremarkable.  No retroperitoneal mass or
adenopathy.  No free intraperitoneal fluid or air.  No bowel
pathology is seen.  No spinal fractures seen.
IMPRESSION: No traumatic finding in the abdomen.

CT PELVIS
FINDINGS: No free fluid in the pelvis.  The bladder, prostate gland
and seminal vesicles are unremarkable.  No mass or adenopathy.  No
evidence of fracture.
IMPRESSION: Negative CT scan of the pelvis

## 2009-07-27 IMAGING — CT CT HEAD W/O CM
1 of 3 series · 15 of 30 positions shown, 19 images · non-contrast
Comparison: 11/26/2006 and earlier.

CLINICAL DATA: 53-year-old male with headache and fall from ladder.

CT HEAD WITHOUT CONTRAST
TECHNIQUE: Contiguous axial images were obtained from the base of
the skull through the vertex without contrast.

[Series 2: head_seq 4.5 h37s st · axial · 0.43mm/px · z∈[-171,-27]mm · 15 of 36 slices shown, 19 images]
[im 2/36  brain]
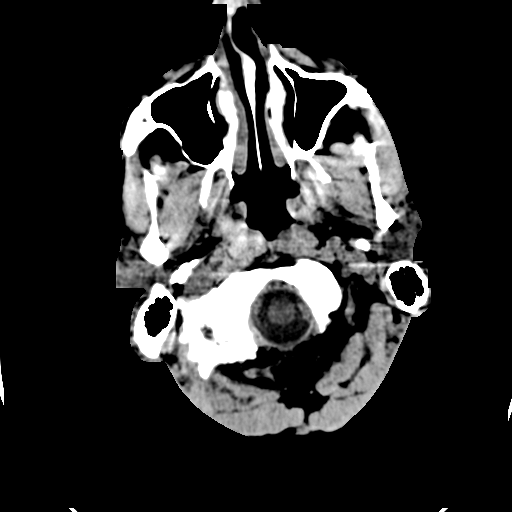
[im 2/36  bone]
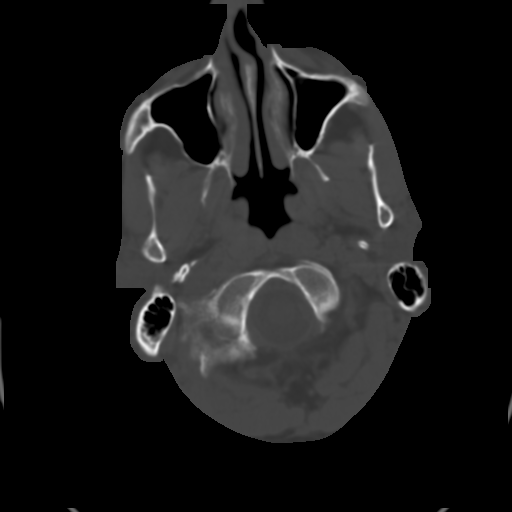
[im 4/36  brain]
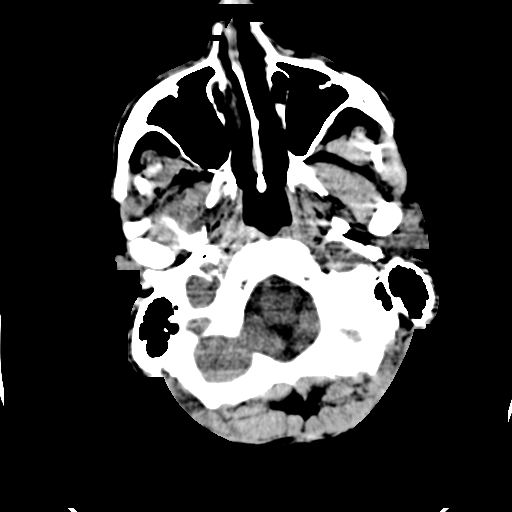
[im 8/36  brain]
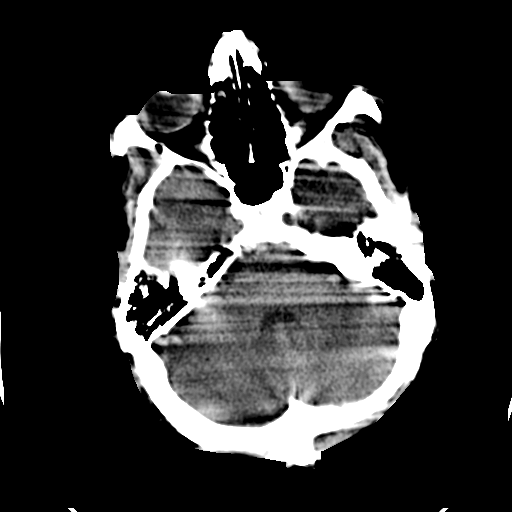
[im 10/36  brain]
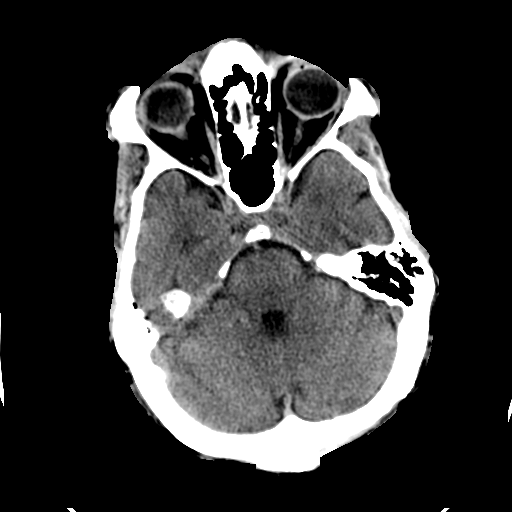
[im 12/36  brain]
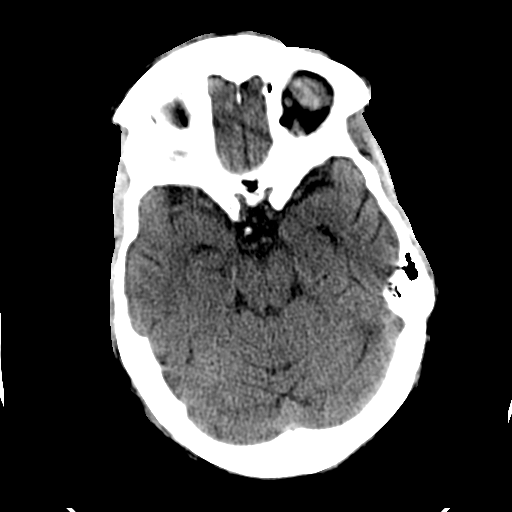
[im 12/36  bone]
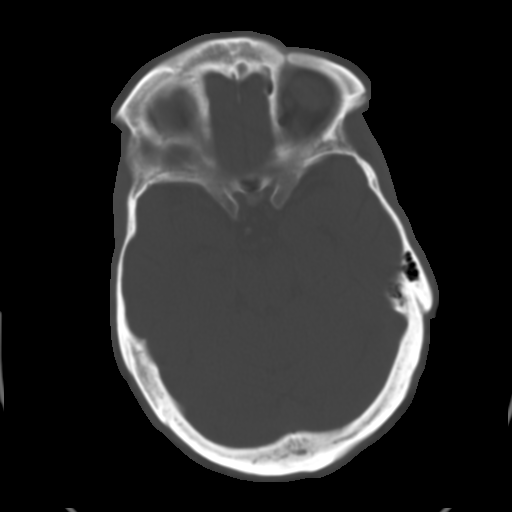
[im 13/36  brain]
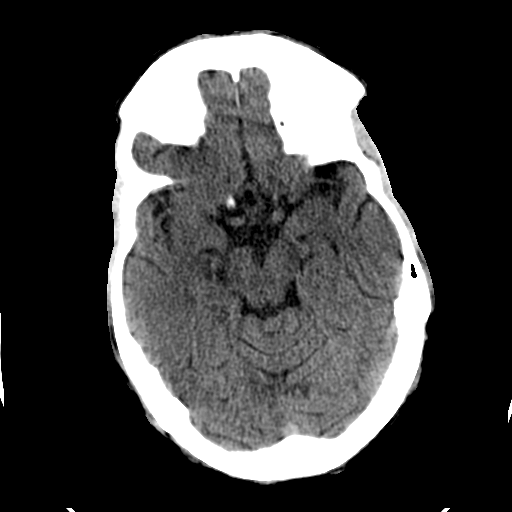
[im 15/36  brain]
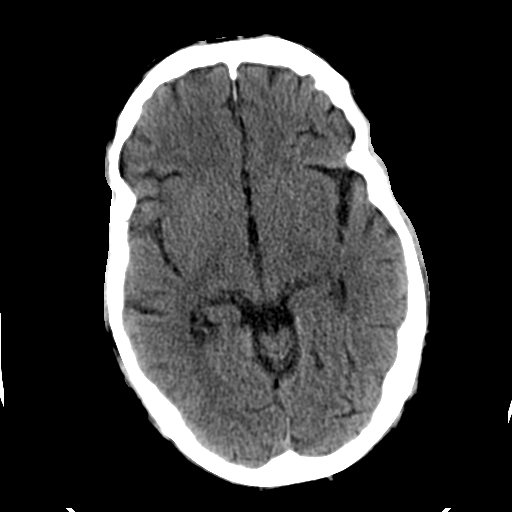
[im 19/36  brain]
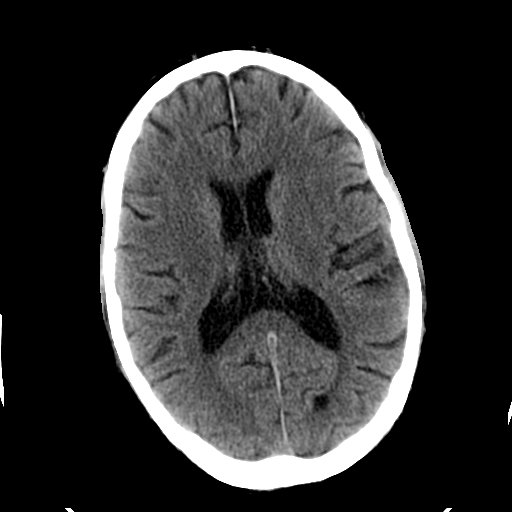
[im 21/36  brain]
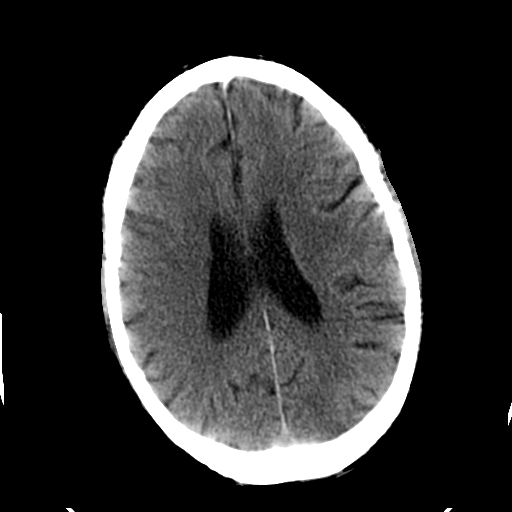
[im 21/36  bone]
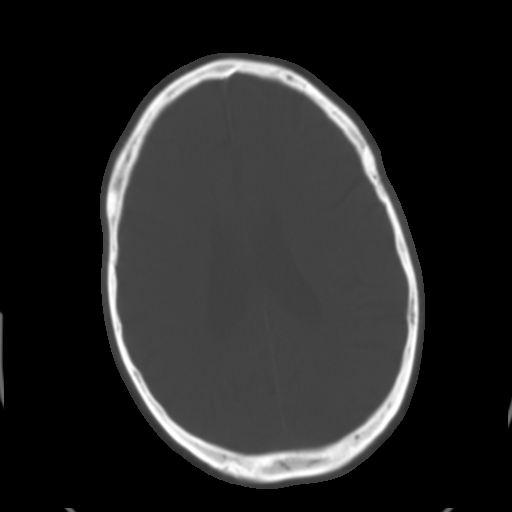
[im 23/36  brain]
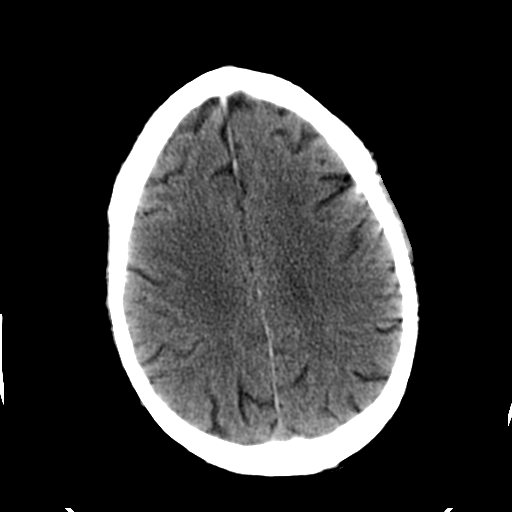
[im 24/36  brain]
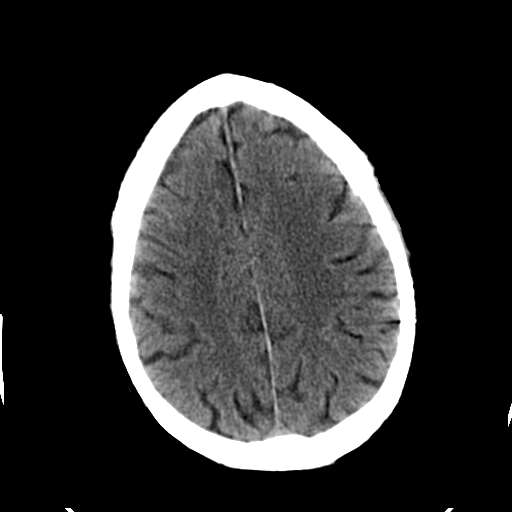
[im 26/36  brain]
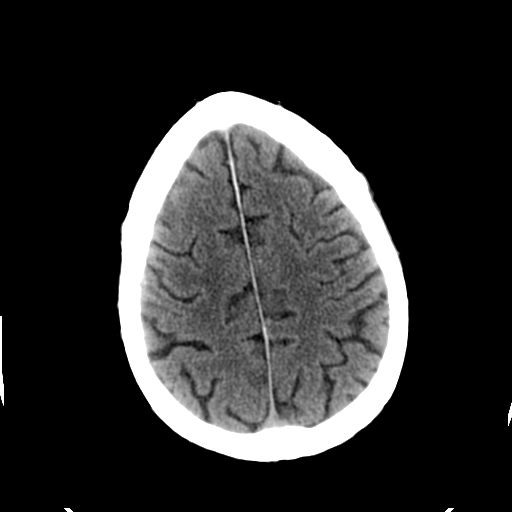
[im 30/36  brain]
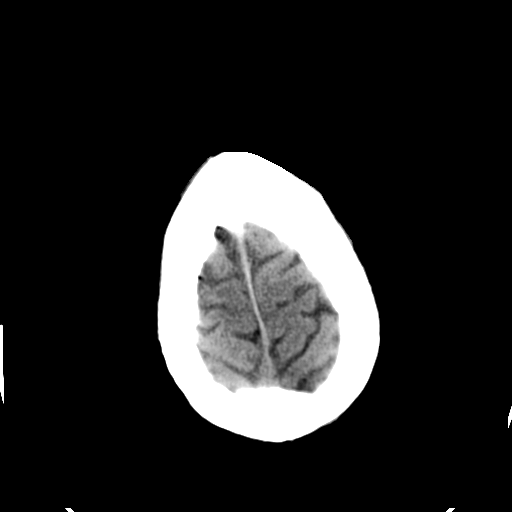
[im 30/36  bone]
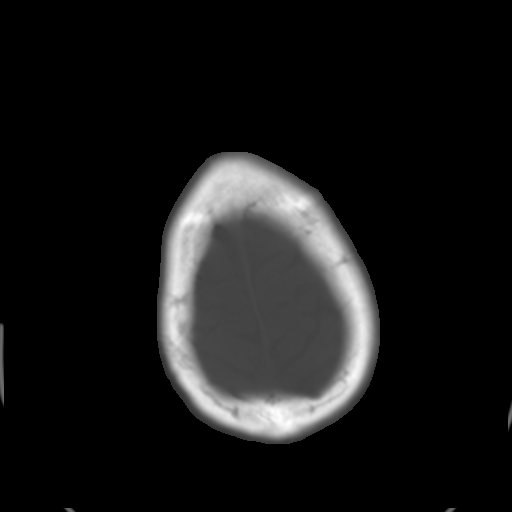
[im 32/36  brain]
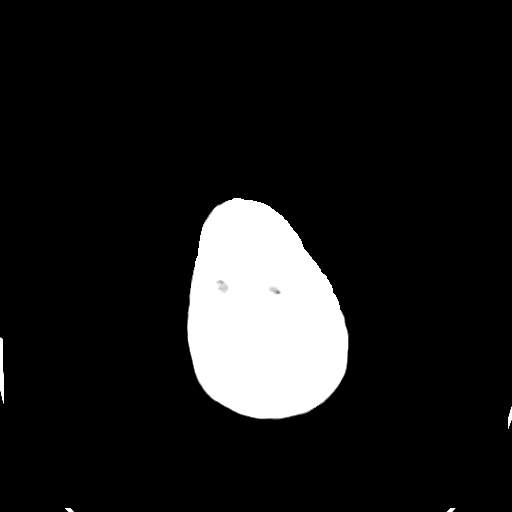
[im 34/36  brain]
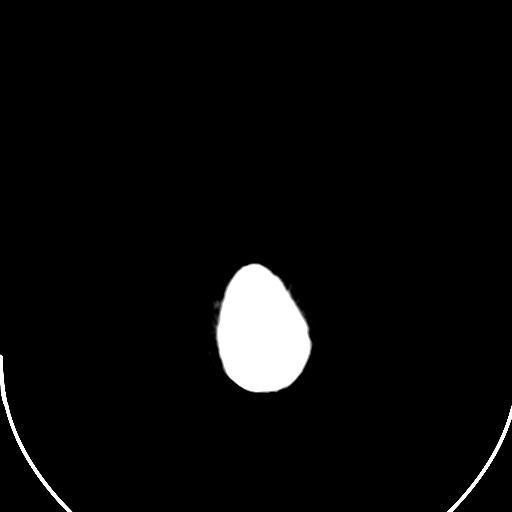

[15 of 30 positions shown; findings below may reference images not displayed]

FINDINGS: Visualized orbits and scalp soft tissues are within
normal limits.  Aside from mild opacification of anterior right
ethmoid air cells and a small mucous retention cyst in the anterior
left maxillary sinus, visualized paranasal sinuses and mastoids are
clear.  No acute osseous abnormality identified.

Stable cerebral volume.  Stable ventricle size and configuration.
No midline shift, mass effect, or evidence of mass lesion.  No
acute intracranial hemorrhage identified.  Gray-white matter
differentiation is within normal limits throughout the brain.  No
evidence of acute cortically based infarct identified.  No
suspicious intracranial vascular hyperdensity.
IMPRESSION: No acute intracranial abnormality.

## 2009-07-27 IMAGING — CT CT CERVICAL SPINE W/O CM
3 series · 13 of 20 positions shown, 15 images · non-contrast
Comparison: Radiographs same day

CLINICAL DATA: Fell.  Right-sided pain.

CT CERVICAL SPINE WITHOUT CONTRAST
TECHNIQUE: Multidetector CT imaging of the cervical spine was
performed. Multiplanar CT image reconstructions were also
generated.

[Series 3: c_spine 2.0 b31s · axial · 0.23mm/px · z∈[-262,-124]mm · 5 of 104 slices shown]
[im 18/104  bone]
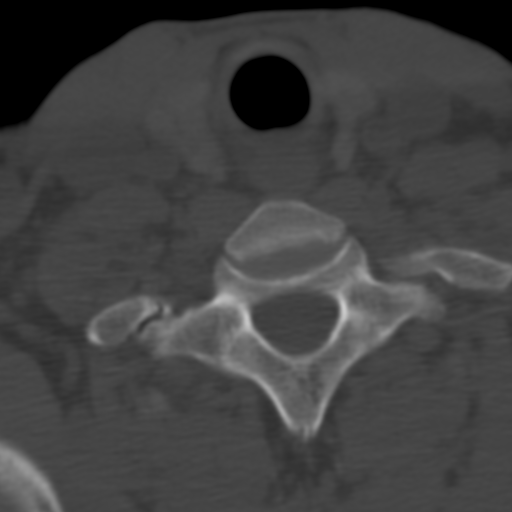
[im 35/104  bone]
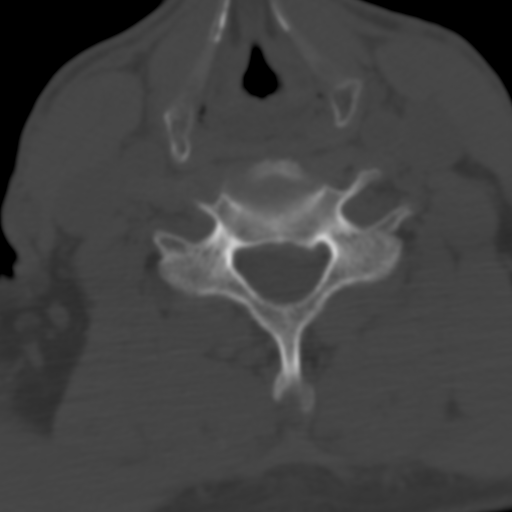
[im 52/104  bone]
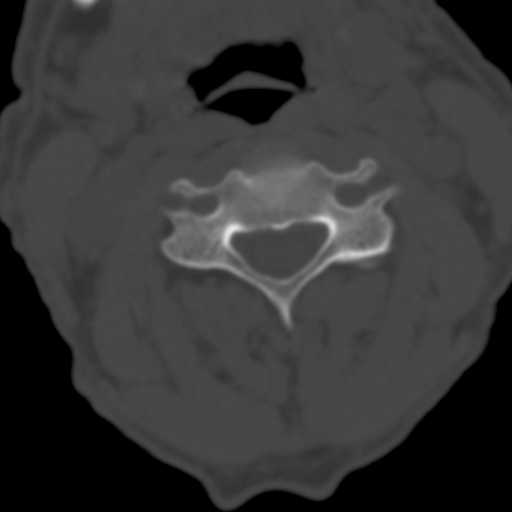
[im 69/104  bone]
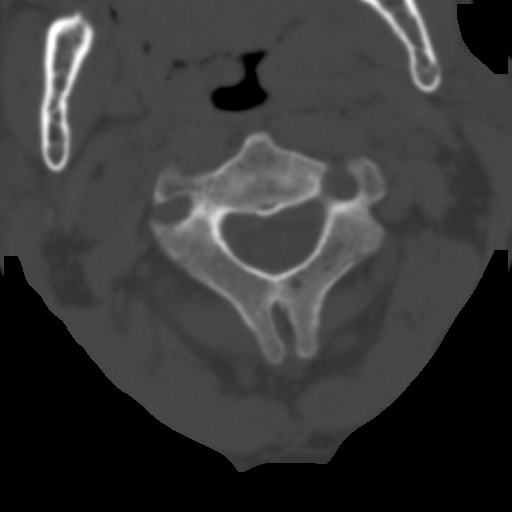
[im 86/104  bone]
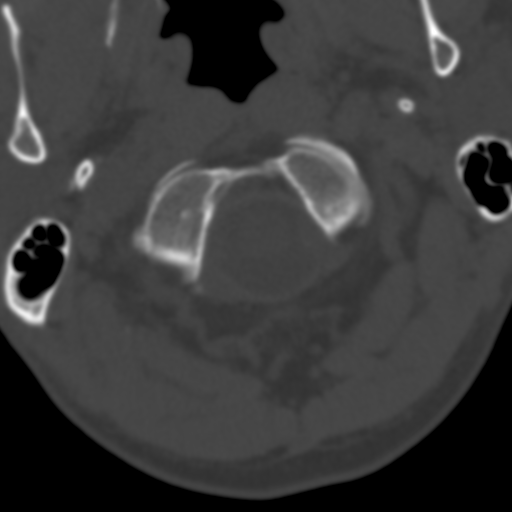

[Series 602: <mpr thick range> · coronal · 0.41mm/px · 3 of 36 slices shown]
[im 8/36  bone]
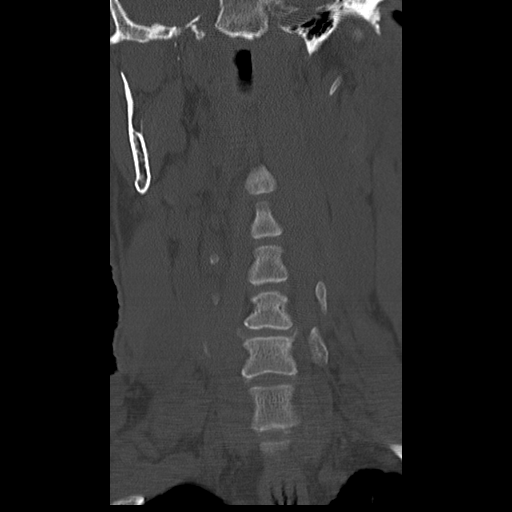
[im 15/36  bone]
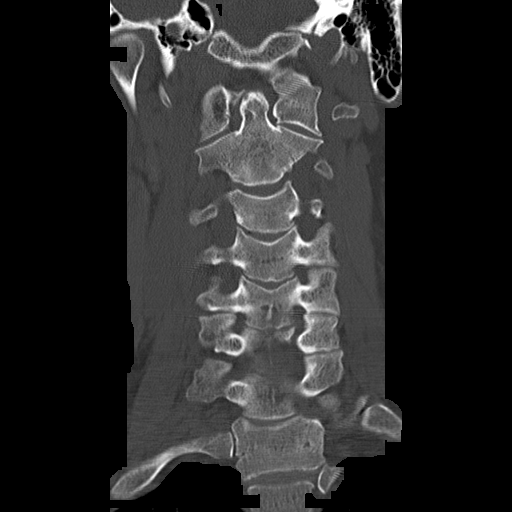
[im 22/36  bone]
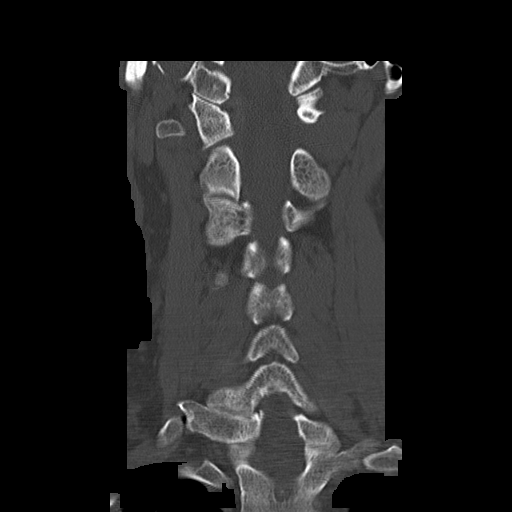

[Series 603: <mpr thick range(1)> · axial · 0.41mm/px · z∈[-301,-177]mm · 5 of 98 slices shown, 7 images]
[im 17/98  soft-tissue]
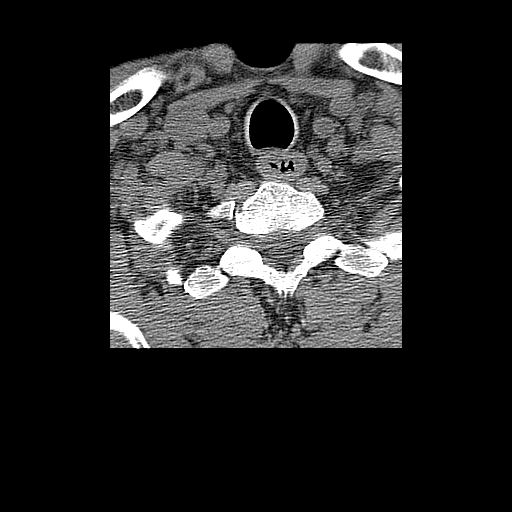
[im 17/98  bone]
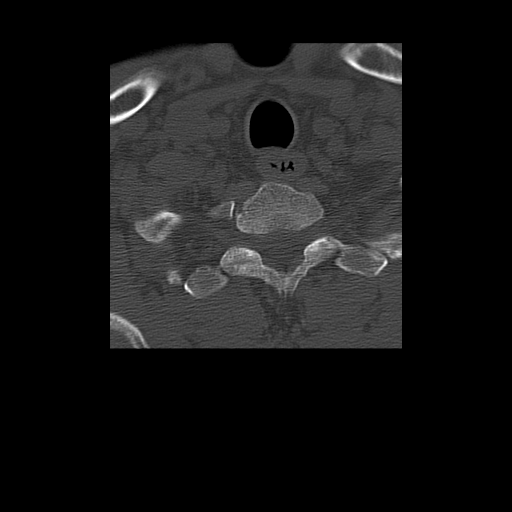
[im 33/98  bone]
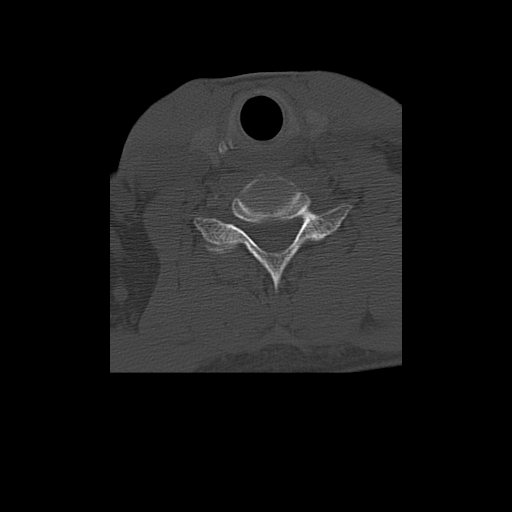
[im 49/98  bone]
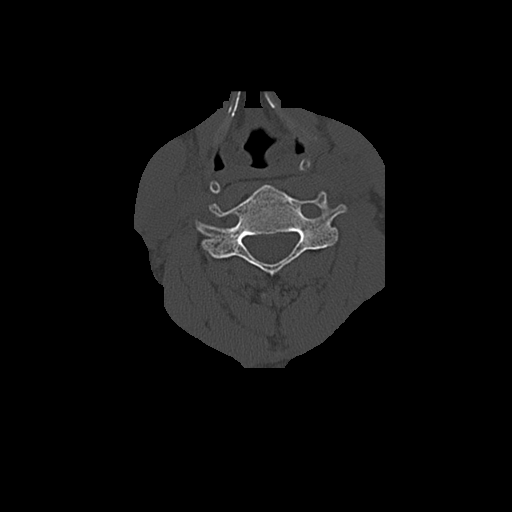
[im 65/98  bone]
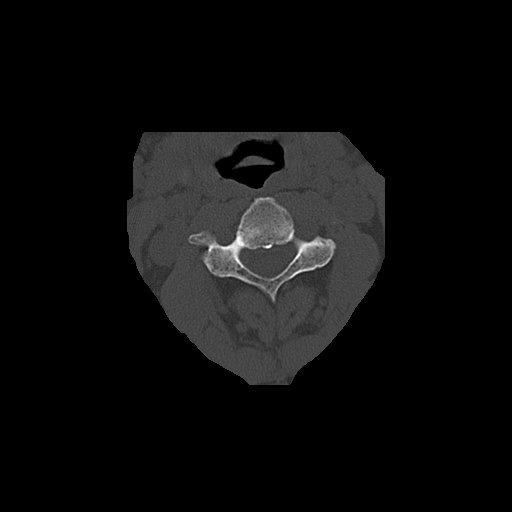
[im 81/98  soft-tissue]
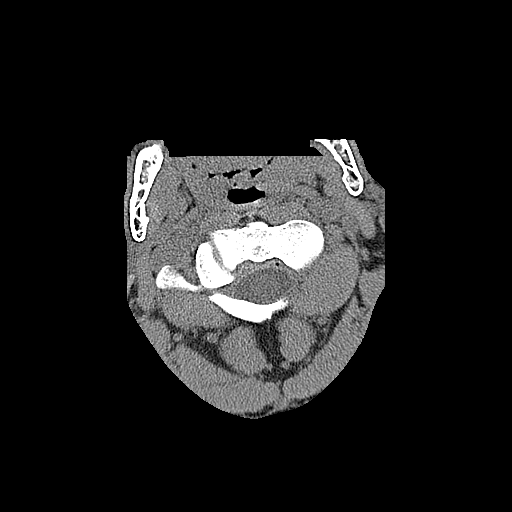
[im 81/98  bone]
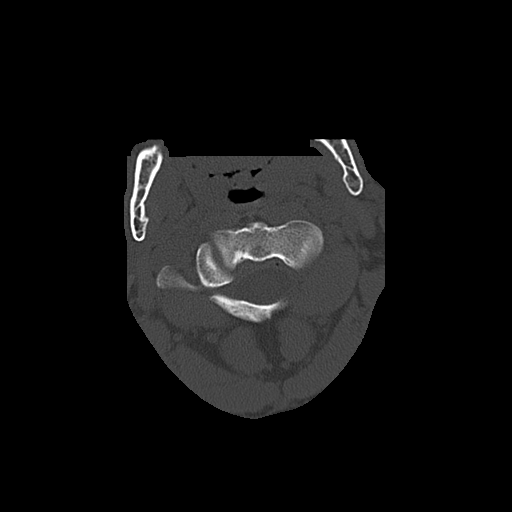

[13 of 20 positions shown; findings below may reference images not displayed]

FINDINGS: Alignment is normal.  There is no fracture.  There is
mild degenerative spondylosis from C3-4 through C6-7.  This is most
pronounced on the left at C5-6 where there is foraminal
encroachment that could be symptomatic.
IMPRESSION: No fracture.

Spondylosis, most pronounced on the left at C5-6 where there is
foraminal encroachment that could be symptomatic.

## 2009-08-08 ENCOUNTER — Ambulatory Visit: Payer: Self-pay | Admitting: Vascular Surgery

## 2009-08-10 ENCOUNTER — Ambulatory Visit: Payer: Self-pay | Admitting: Vascular Surgery

## 2009-08-10 ENCOUNTER — Ambulatory Visit (HOSPITAL_COMMUNITY): Admission: RE | Admit: 2009-08-10 | Discharge: 2009-08-10 | Payer: Self-pay | Admitting: Vascular Surgery

## 2009-08-11 ENCOUNTER — Ambulatory Visit (HOSPITAL_COMMUNITY): Admission: RE | Admit: 2009-08-11 | Discharge: 2009-08-11 | Payer: Self-pay | Admitting: Vascular Surgery

## 2009-08-11 ENCOUNTER — Ambulatory Visit: Payer: Self-pay | Admitting: Vascular Surgery

## 2009-09-14 ENCOUNTER — Ambulatory Visit: Payer: Self-pay | Admitting: Vascular Surgery

## 2009-09-14 IMAGING — CR DG CHEST 2V
3 series · 3 of 3 positions shown · non-contrast
Comparison: 06/07/2008

CLINICAL DATA: Short of breath.  Smoker.  Hypertension.

CHEST - 2 VIEW

[w chest pa]
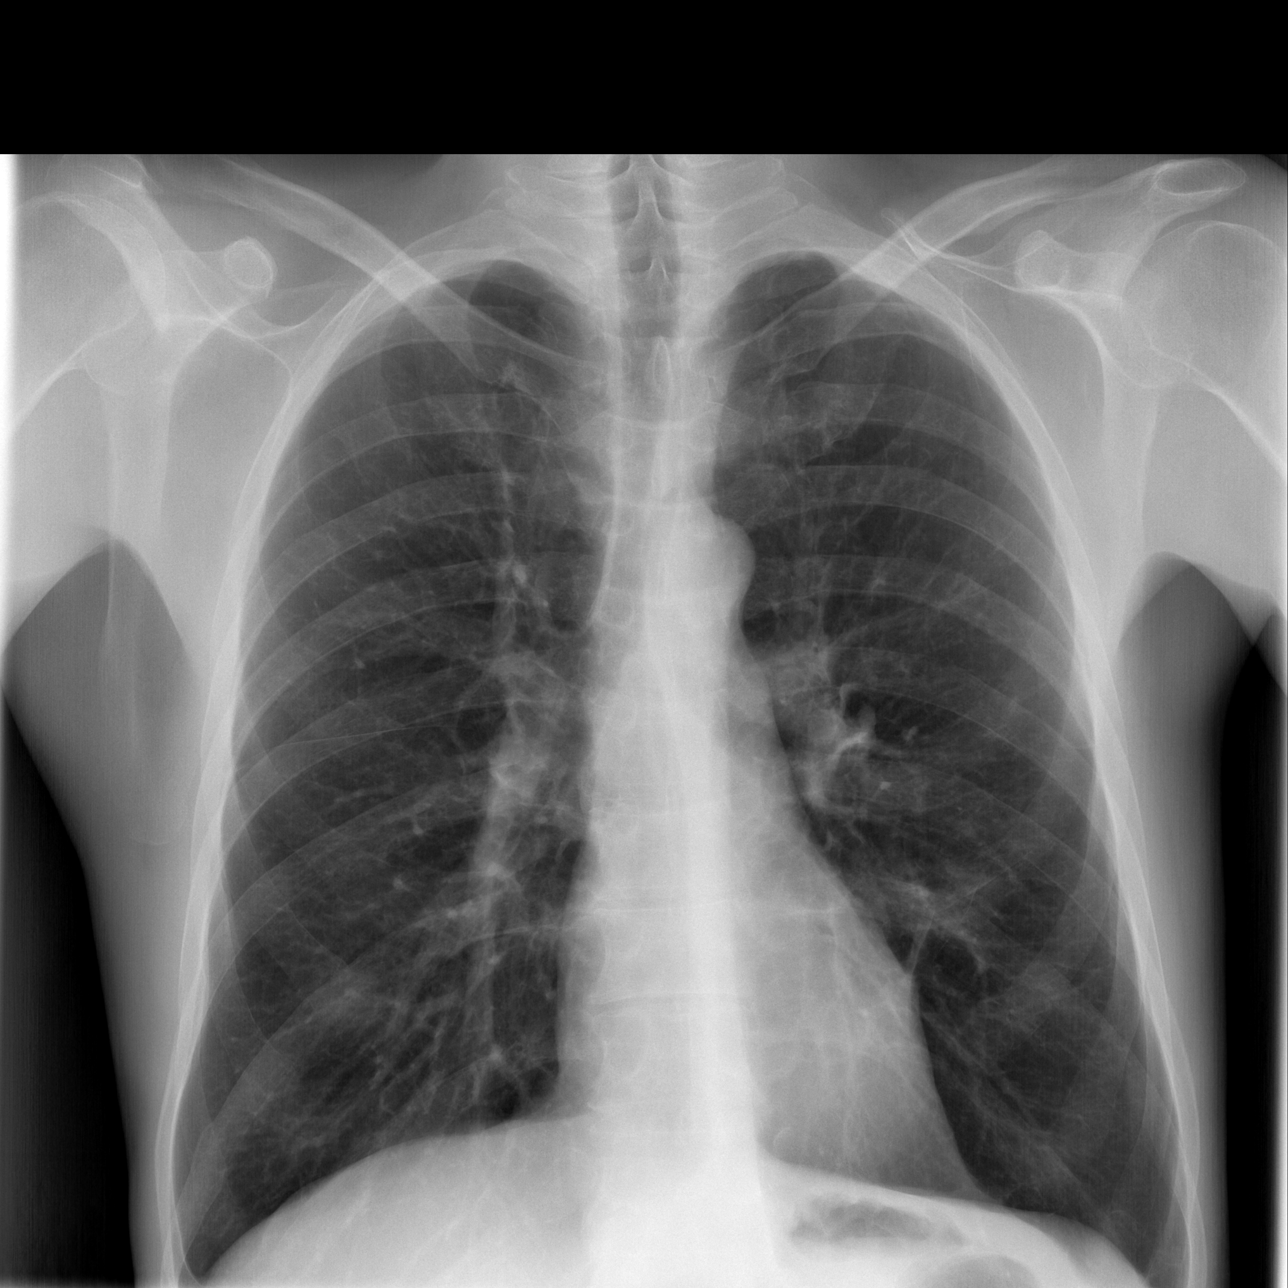

[w chest lat (1 of 2)]
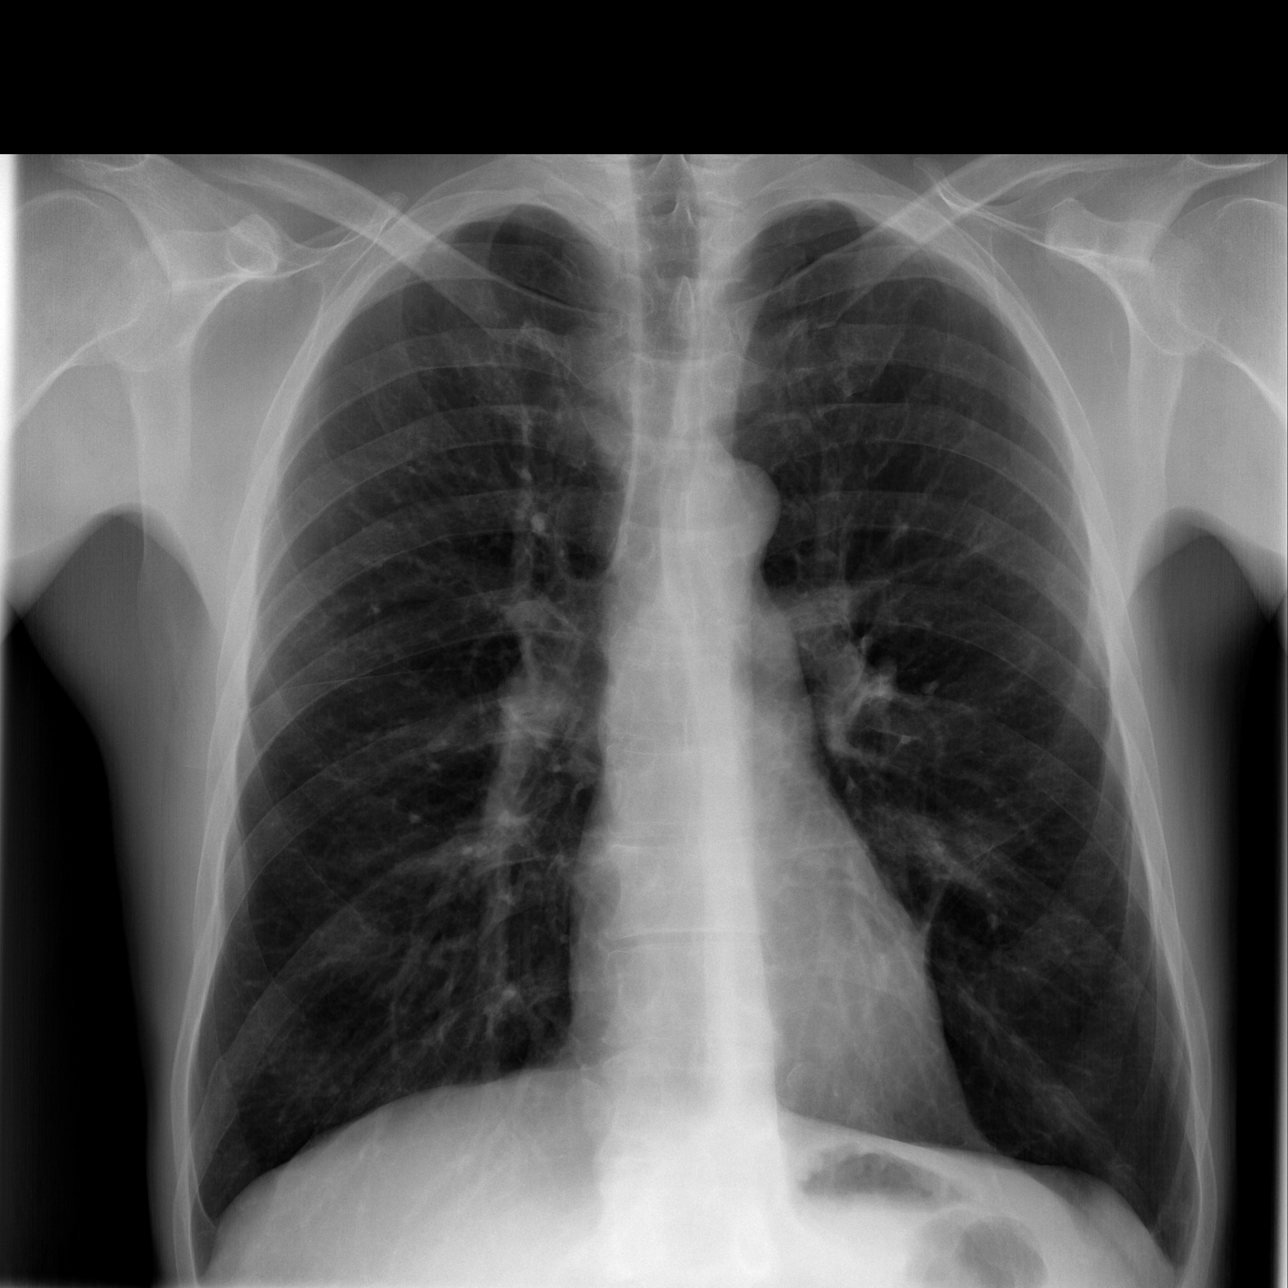

[w chest lat (2 of 2)]
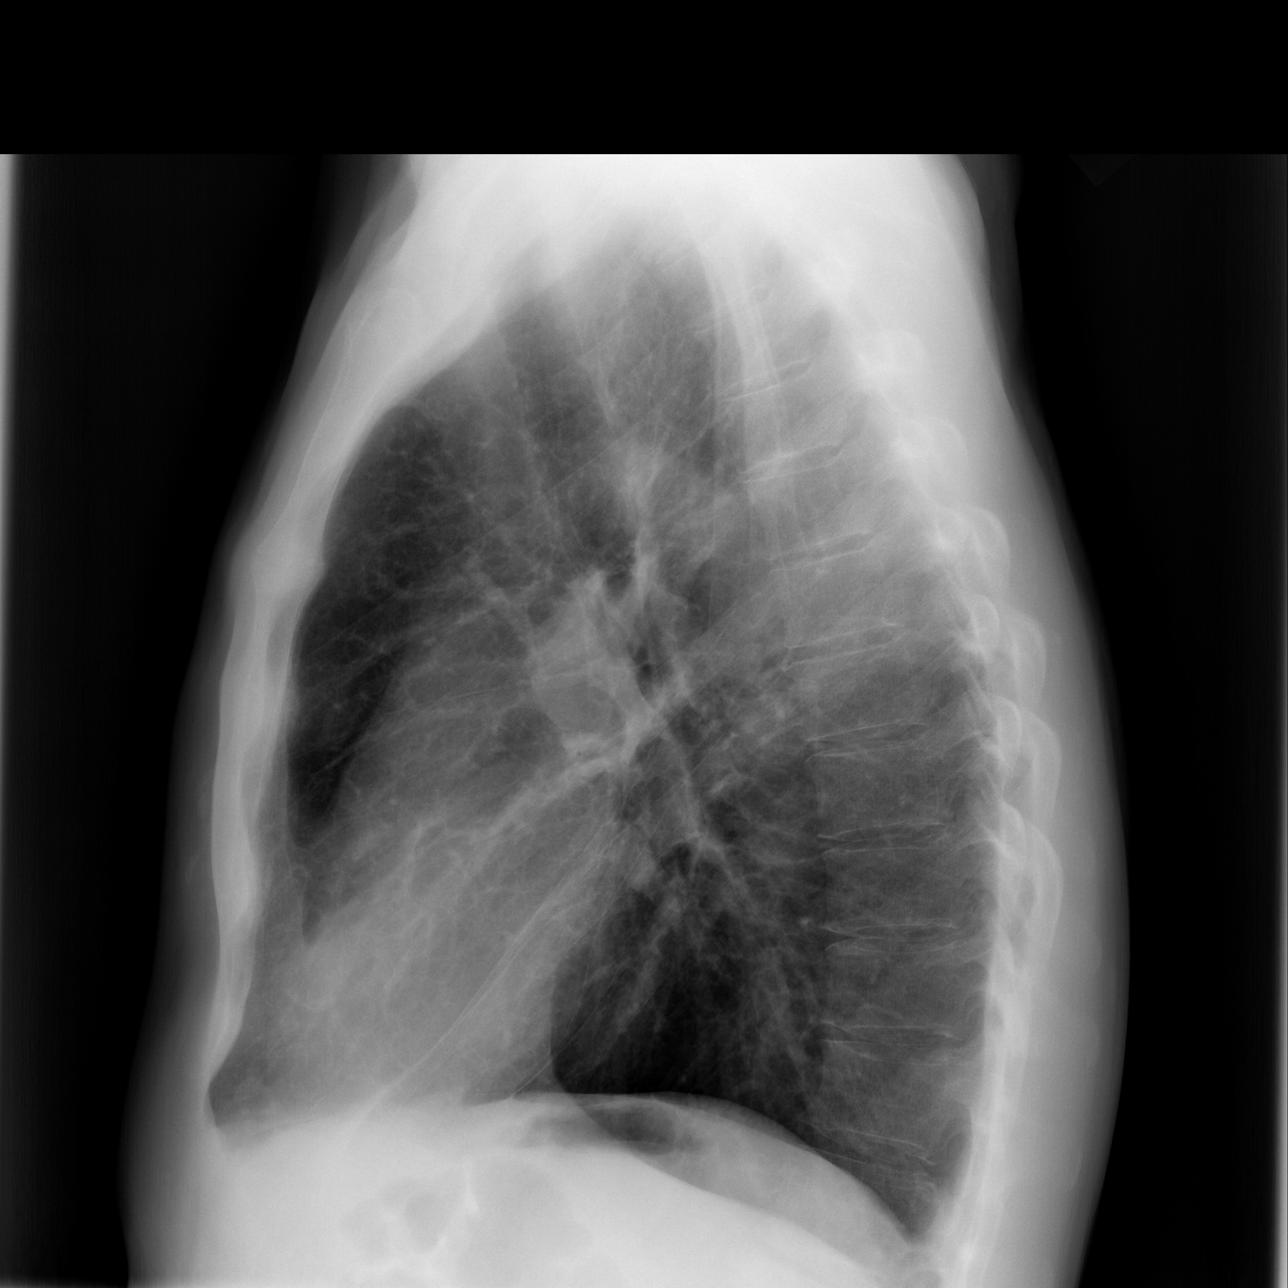

[3 of 3 positions shown; findings below may reference images not displayed]

FINDINGS: Lungs are hyperaerated with diaphragmatic flattening.
Lungs are clear of an active process.  Normal cardiomediastinal
silhouette.  Mild scarring in the left lower lung zone. This is
been noted on multiple prior exams.
IMPRESSION: COPD/emphysema.  No acute chest findings.

## 2009-10-16 ENCOUNTER — Encounter: Payer: Self-pay | Admitting: Internal Medicine

## 2009-10-25 ENCOUNTER — Telehealth: Payer: Self-pay | Admitting: Internal Medicine

## 2009-10-26 ENCOUNTER — Ambulatory Visit: Payer: Self-pay | Admitting: Vascular Surgery

## 2009-12-06 ENCOUNTER — Encounter: Payer: Self-pay | Admitting: Internal Medicine

## 2009-12-15 IMAGING — XA IR FLUORO GUIDE CV LINE*R*
1 series · 1 of 1 positions shown · non-contrast
Comparison: none

INDICATION: End-stage renal disease.

[Series 1: fl - angio · 1 of 1 slices shown]
[im 1/1]
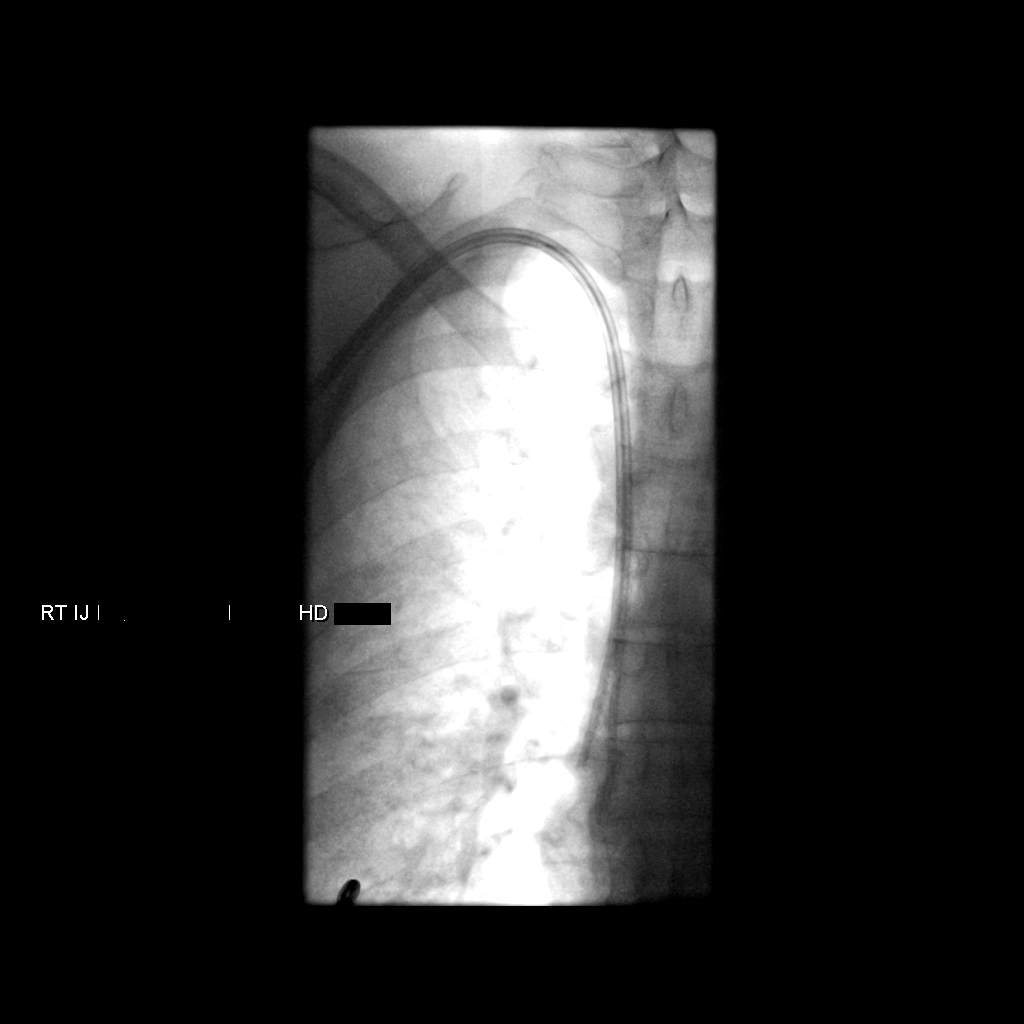

[1 of 1 positions shown; findings below may reference images not displayed]

PROCEDURE(S): FLUOROSCOPIC AND ULTRASOUND GUIDED PLACEMENT OF A
TUNNELED DIAYSIS CATHETER

Medications: Ancef 1 gm.  Versed 3 mg, Fentanyl 150 mcg

Moderate sedation time: 38 minutes

Fluoroscopy time: 6.3minutes

Procedure:Informed consent was obtained for placement of a tunneled
dialysis catheter.  The patient was placed supine on the
interventional table.  Ultrasound confirmed a patent right internal
jugularvein.  Ultrasound images were obtained for documentation.
The right neck and chest was prepped and draped in a sterile
fashion.  The right neck was anesthetized with 1% lidocaine.
Maximal barrier sterile technique was utilized including caps,
mask, sterile gowns, sterile gloves, sterile drape, hand hygiene
and skin antiseptic.  A small incision was made with #11 blade
scalpel.  A 21 gauge needle directed into the right internal
jugular vein with ultrasound guidance.  A micropuncture dilator set
was placed.  A 23 cm tip to cuff EquiStream catheter was selected.
The skin below the right clavicle was anesthetized and a small
incision was made with an #11 blade scalpel.  A subcutaneous tunnel
was formed to the vein dermatotomy site.  The catheter was brought
through the tunnel.  The vein dermatotomy site was dilated to
accommodate a peel-away sheath.  The catheter was placed through
the peel-away sheath and directed into the central venous
structures.  The tip of the catheter was placed in the distal SVC
with fluoroscopy.  Fluoroscopic images were obtained for
documentation.  Both lumens were found to aspirate and flush well.
The proper amount of heparin was flushed in both lumens.  The vein
dermatotomy site was closed using a single layer of absorbable
suture and Dermabond.  The catheter was secured to the skin using
Prolene suture.
FINDINGS: Catheter tip in the distal SVC.
IMPRESSION: Successful placement of a right chest tunneled dialysis
catheter using ultrasound and fluoroscopic guidance.

## 2009-12-15 IMAGING — US IR US GUIDE VASC ACCESS RIGHT
1 series · 1 of 1 positions shown · non-contrast
Comparison: none

INDICATION: End-stage renal disease.

[Series 1: sp us guide vasc access*right* · 1 of 1 slices shown]
[im 1/1]
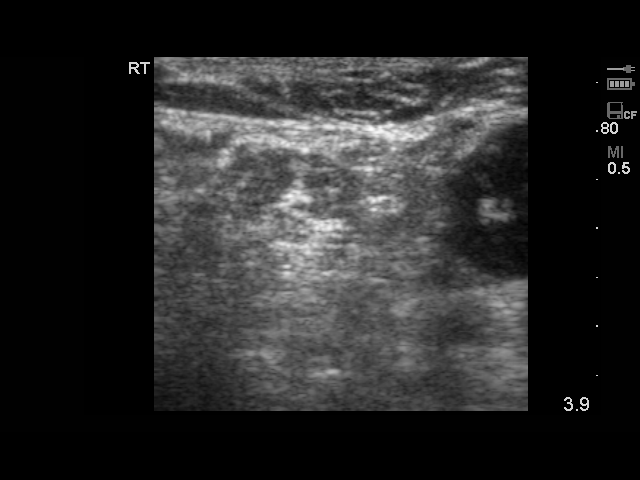

[1 of 1 positions shown; findings below may reference images not displayed]

PROCEDURE(S): FLUOROSCOPIC AND ULTRASOUND GUIDED PLACEMENT OF A
TUNNELED DIAYSIS CATHETER

Medications: Ancef 1 gm.  Versed 3 mg, Fentanyl 150 mcg

Moderate sedation time: 38 minutes

Fluoroscopy time: 6.3minutes

Procedure:Informed consent was obtained for placement of a tunneled
dialysis catheter.  The patient was placed supine on the
interventional table.  Ultrasound confirmed a patent right internal
jugularvein.  Ultrasound images were obtained for documentation.
The right neck and chest was prepped and draped in a sterile
fashion.  The right neck was anesthetized with 1% lidocaine.
Maximal barrier sterile technique was utilized including caps,
mask, sterile gowns, sterile gloves, sterile drape, hand hygiene
and skin antiseptic.  A small incision was made with #11 blade
scalpel.  A 21 gauge needle directed into the right internal
jugular vein with ultrasound guidance.  A micropuncture dilator set
was placed.  A 23 cm tip to cuff EquiStream catheter was selected.
The skin below the right clavicle was anesthetized and a small
incision was made with an #11 blade scalpel.  A subcutaneous tunnel
was formed to the vein dermatotomy site.  The catheter was brought
through the tunnel.  The vein dermatotomy site was dilated to
accommodate a peel-away sheath.  The catheter was placed through
the peel-away sheath and directed into the central venous
structures.  The tip of the catheter was placed in the distal SVC
with fluoroscopy.  Fluoroscopic images were obtained for
documentation.  Both lumens were found to aspirate and flush well.
The proper amount of heparin was flushed in both lumens.  The vein
dermatotomy site was closed using a single layer of absorbable
suture and Dermabond.  The catheter was secured to the skin using
Prolene suture.
FINDINGS: Catheter tip in the distal SVC.
IMPRESSION: Successful placement of a right chest tunneled dialysis
catheter using ultrasound and fluoroscopic guidance.

## 2009-12-19 ENCOUNTER — Telehealth: Payer: Self-pay | Admitting: *Deleted

## 2009-12-24 ENCOUNTER — Encounter: Payer: Self-pay | Admitting: Internal Medicine

## 2009-12-24 ENCOUNTER — Ambulatory Visit: Payer: Self-pay | Admitting: Internal Medicine

## 2009-12-24 ENCOUNTER — Inpatient Hospital Stay (HOSPITAL_COMMUNITY): Admission: EM | Admit: 2009-12-24 | Discharge: 2009-12-27 | Payer: Self-pay | Admitting: Internal Medicine

## 2009-12-25 ENCOUNTER — Ambulatory Visit: Payer: Self-pay | Admitting: Psychiatry

## 2009-12-27 ENCOUNTER — Encounter: Payer: Self-pay | Admitting: Internal Medicine

## 2010-01-18 ENCOUNTER — Ambulatory Visit (HOSPITAL_COMMUNITY): Admission: RE | Admit: 2010-01-18 | Discharge: 2010-01-18 | Payer: Self-pay | Admitting: Nephrology

## 2010-01-21 ENCOUNTER — Ambulatory Visit: Payer: Self-pay | Admitting: Internal Medicine

## 2010-01-21 ENCOUNTER — Inpatient Hospital Stay (HOSPITAL_COMMUNITY): Admission: EM | Admit: 2010-01-21 | Discharge: 2010-01-29 | Payer: Self-pay | Admitting: Emergency Medicine

## 2010-01-21 ENCOUNTER — Encounter: Payer: Self-pay | Admitting: Internal Medicine

## 2010-01-25 IMAGING — XA IR AV DIALYSIS SHUNT INTRO NEEDLE *L*
1 series · 13 of 19 positions shown · non-contrast
Comparison: none

CLINICAL HISTORY: Poorly maturing left arm fistula

[Series 1: run · 13 of 19 slices shown]
[im 1/19]
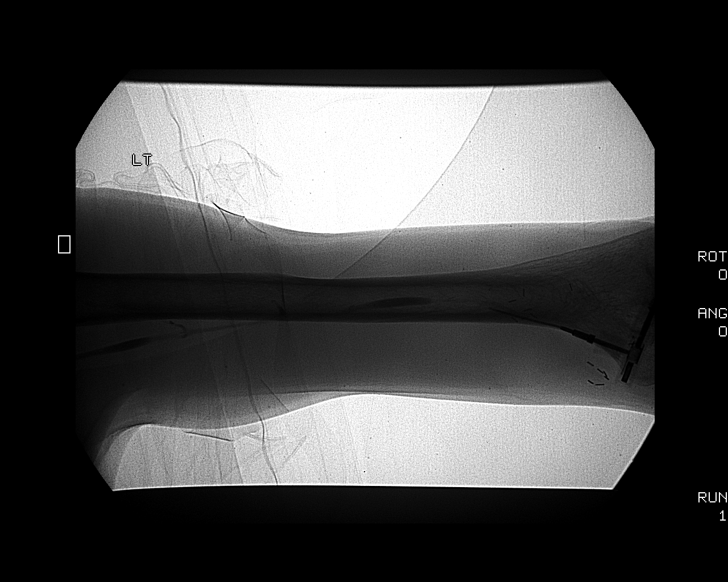
[im 3/19]
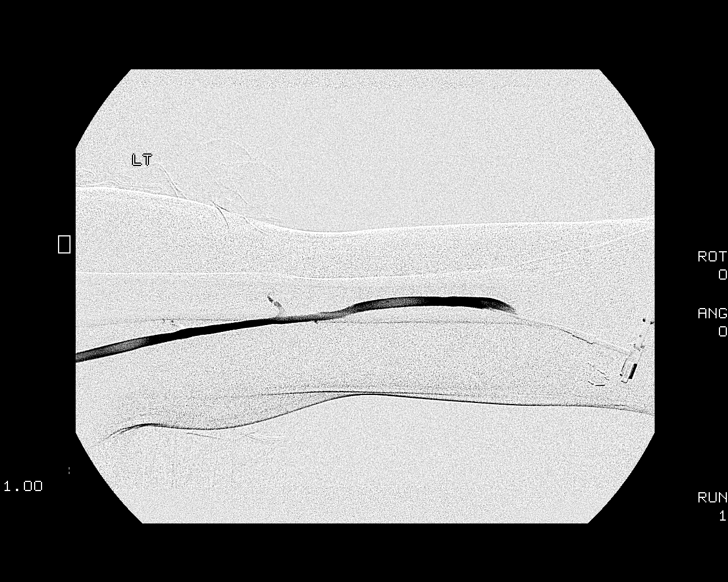
[im 4/19]
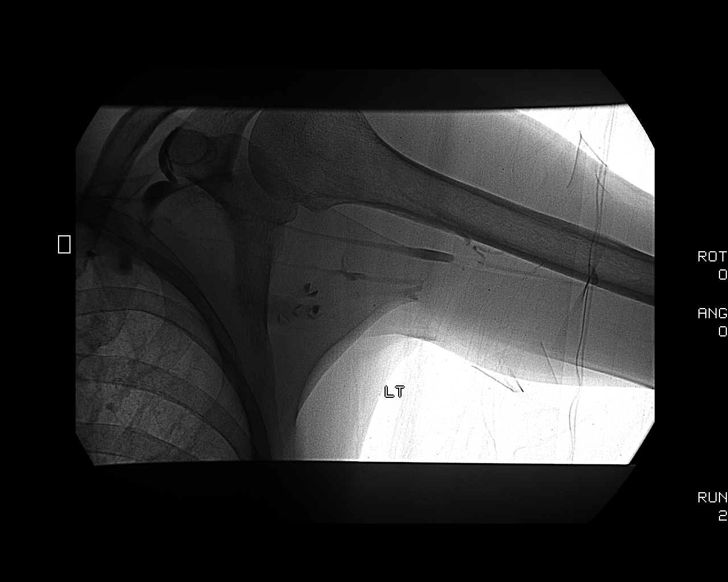
[im 6/19]
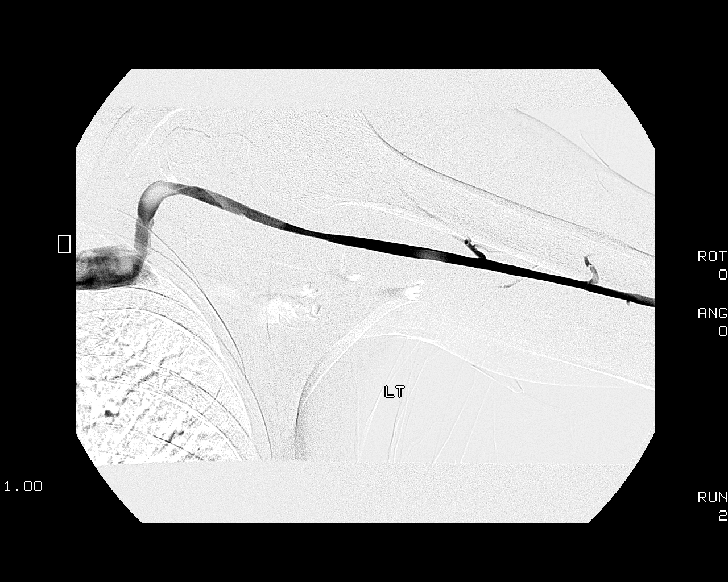
[im 7/19]
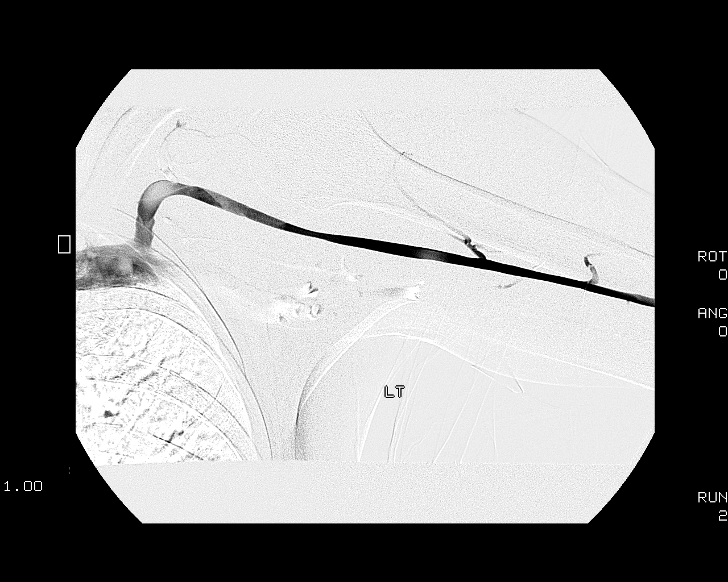
[im 9/19]
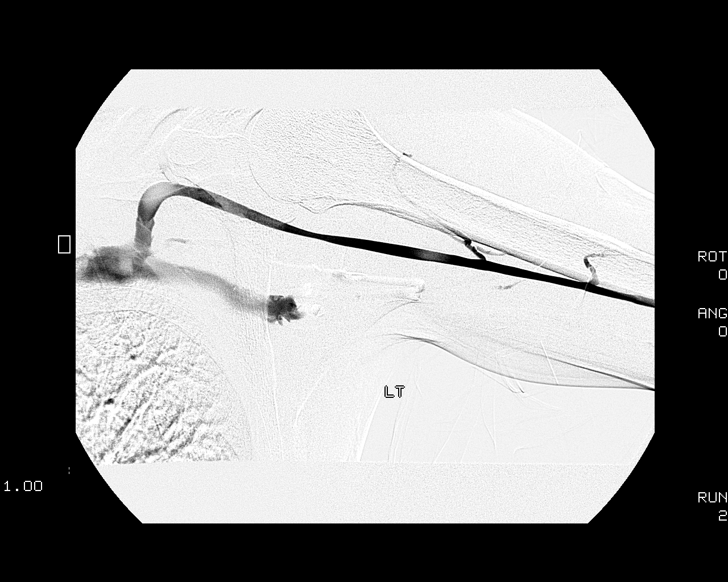
[im 10/19]
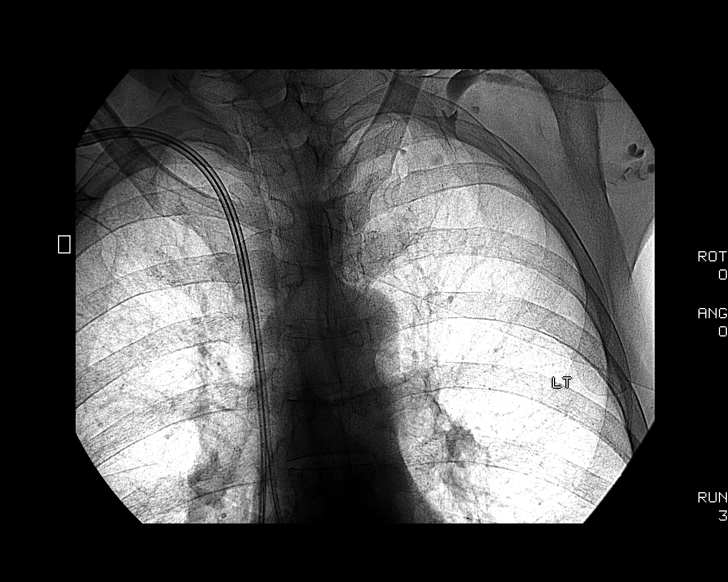
[im 11/19]
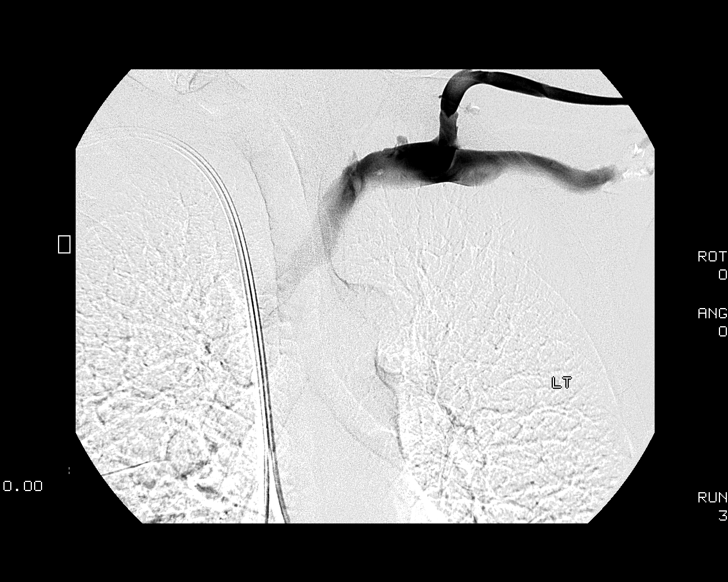
[im 13/19]
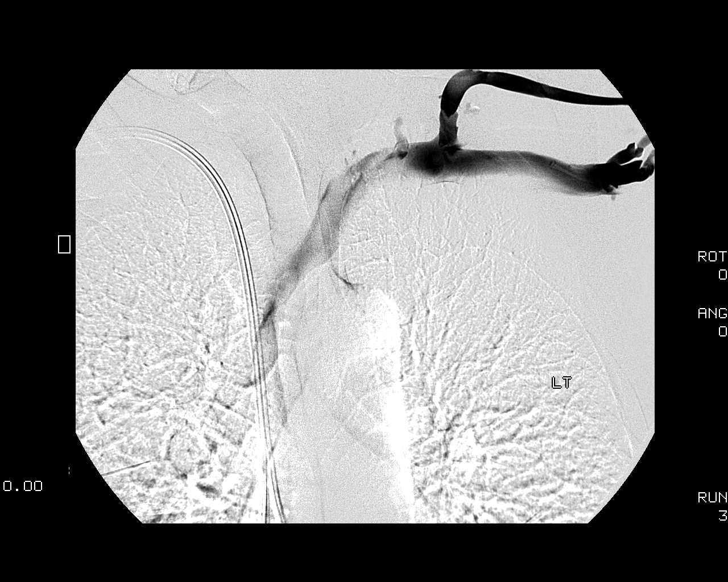
[im 14/19]
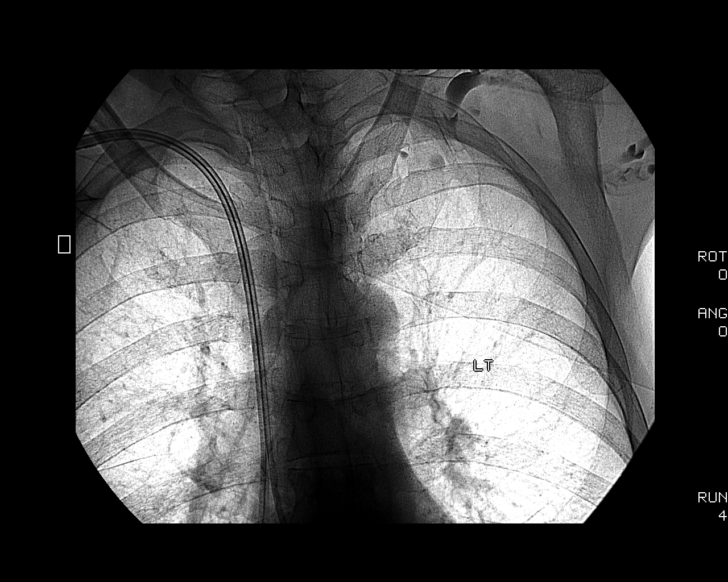
[im 16/19]
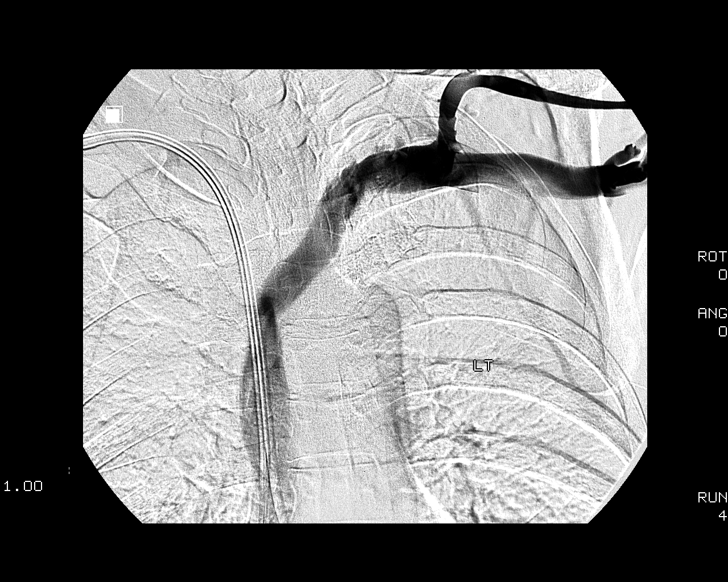
[im 17/19]
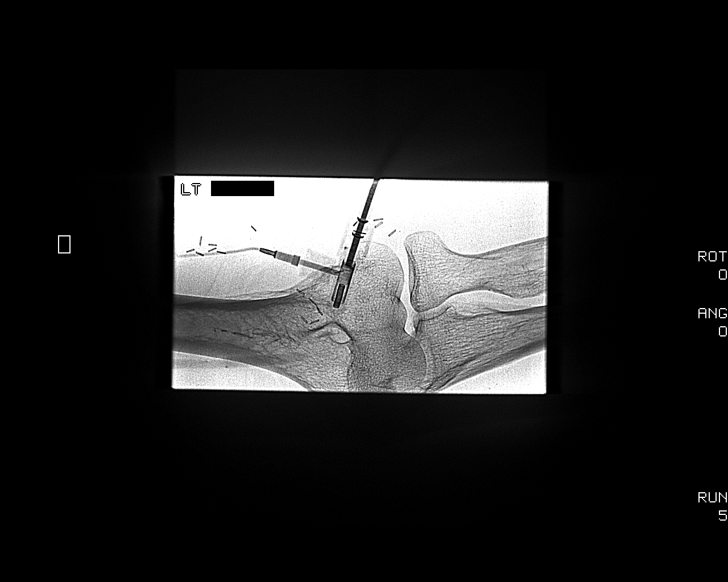
[im 19/19]
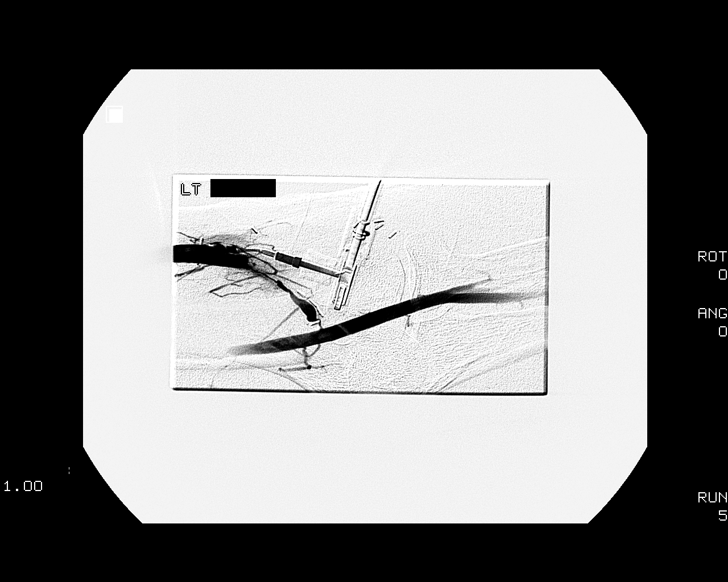

[13 of 19 positions shown; findings below may reference images not displayed]

PROCEDURE(S): LEFT UPPER EXTREMITY FISTULOGRAM

Medications:None

Moderate sedation time:None

Fluoroscopy time: 1.0 minutes

Contrast:  33 ml Wmnipaque-3CC

Procedure:The patient has a very small fistula in the left
antecubital region without a significant pulse.  Multiple attempts
were made to cannulate the vein using a tourniquet but these
attempts were unsuccessful.  As a result, the area was prepped and
draped in a sterile fashion.  A peripheral IV was placed in the
draining cephalic vein using ultrasound guidance.  Series of
fistulogram images were obtained.  The catheter was removed at the
end of the procedure.
FINDINGS: The patient has a right jugular dialysis catheter with the
tip at the cavoatrial junction.  The upper arm cephalic vein is
widely patent and the central veins are patent.  There is a
critical cephalic vein stenosis involving cephalic vein at the
arterial anastomosis and a few centimeters beyond the anastomosis.
This lesion has an angiographic "string sign".
IMPRESSION: Critical stenosis involving the cephalic vein at the
arterial anastomosis.  Recommend surgical consultation for a
possible revision.

## 2010-02-20 ENCOUNTER — Ambulatory Visit (HOSPITAL_COMMUNITY): Admission: RE | Admit: 2010-02-20 | Discharge: 2010-02-20 | Payer: Self-pay | Admitting: Nephrology

## 2010-03-29 ENCOUNTER — Emergency Department (HOSPITAL_COMMUNITY): Admission: EM | Admit: 2010-03-29 | Discharge: 2010-03-29 | Payer: Self-pay | Admitting: Emergency Medicine

## 2010-04-02 ENCOUNTER — Ambulatory Visit (HOSPITAL_COMMUNITY): Admission: RE | Admit: 2010-04-02 | Discharge: 2010-04-02 | Payer: Self-pay | Admitting: Nephrology

## 2010-04-06 ENCOUNTER — Ambulatory Visit: Payer: Self-pay | Admitting: Infectious Diseases

## 2010-04-06 ENCOUNTER — Encounter: Payer: Self-pay | Admitting: Internal Medicine

## 2010-04-06 ENCOUNTER — Inpatient Hospital Stay (HOSPITAL_COMMUNITY): Admission: EM | Admit: 2010-04-06 | Discharge: 2010-04-07 | Payer: Self-pay | Admitting: Emergency Medicine

## 2010-04-08 ENCOUNTER — Emergency Department (HOSPITAL_COMMUNITY): Admission: EM | Admit: 2010-04-08 | Discharge: 2010-04-08 | Payer: Self-pay | Admitting: Emergency Medicine

## 2010-04-09 ENCOUNTER — Emergency Department (HOSPITAL_COMMUNITY): Admission: EM | Admit: 2010-04-09 | Discharge: 2010-04-10 | Payer: Self-pay | Admitting: Emergency Medicine

## 2010-04-11 ENCOUNTER — Emergency Department (HOSPITAL_COMMUNITY): Admission: EM | Admit: 2010-04-11 | Discharge: 2010-04-11 | Payer: Self-pay | Admitting: Emergency Medicine

## 2010-05-09 ENCOUNTER — Inpatient Hospital Stay (HOSPITAL_COMMUNITY): Admission: EM | Admit: 2010-05-09 | Discharge: 2010-05-13 | Payer: Self-pay | Admitting: Emergency Medicine

## 2010-05-09 ENCOUNTER — Ambulatory Visit: Payer: Self-pay | Admitting: Surgery

## 2010-05-09 ENCOUNTER — Encounter: Payer: Self-pay | Admitting: Internal Medicine

## 2010-05-10 ENCOUNTER — Ambulatory Visit: Payer: Self-pay

## 2010-05-13 ENCOUNTER — Encounter: Payer: Self-pay | Admitting: Internal Medicine

## 2010-05-13 DIAGNOSIS — K279 Peptic ulcer, site unspecified, unspecified as acute or chronic, without hemorrhage or perforation: Secondary | ICD-10-CM | POA: Insufficient documentation

## 2010-05-16 ENCOUNTER — Telehealth: Payer: Self-pay | Admitting: Internal Medicine

## 2010-05-16 ENCOUNTER — Ambulatory Visit: Payer: Self-pay | Admitting: Internal Medicine

## 2010-05-16 ENCOUNTER — Encounter: Payer: Self-pay | Admitting: Internal Medicine

## 2010-05-16 ENCOUNTER — Inpatient Hospital Stay (HOSPITAL_COMMUNITY): Admission: EM | Admit: 2010-05-16 | Discharge: 2010-05-22 | Payer: Self-pay | Admitting: Emergency Medicine

## 2010-05-21 ENCOUNTER — Encounter: Payer: Self-pay | Admitting: Internal Medicine

## 2010-06-12 ENCOUNTER — Inpatient Hospital Stay (HOSPITAL_COMMUNITY): Admission: AD | Admit: 2010-06-12 | Discharge: 2010-06-15 | Payer: Self-pay | Admitting: Internal Medicine

## 2010-06-12 ENCOUNTER — Encounter: Payer: Self-pay | Admitting: Internal Medicine

## 2010-06-12 ENCOUNTER — Ambulatory Visit: Payer: Self-pay | Admitting: Internal Medicine

## 2010-06-15 ENCOUNTER — Encounter: Payer: Self-pay | Admitting: Internal Medicine

## 2010-06-15 DIAGNOSIS — K861 Other chronic pancreatitis: Secondary | ICD-10-CM

## 2010-07-01 IMAGING — CR DG RIBS W/ CHEST 3+V*L*
3 series · 3 of 3 positions shown · non-contrast
Comparison: 03/09/2009

CLINICAL DATA: Left rib pain

LEFT RIBS AND CHEST - 3+ VIEW

[w chest pa]
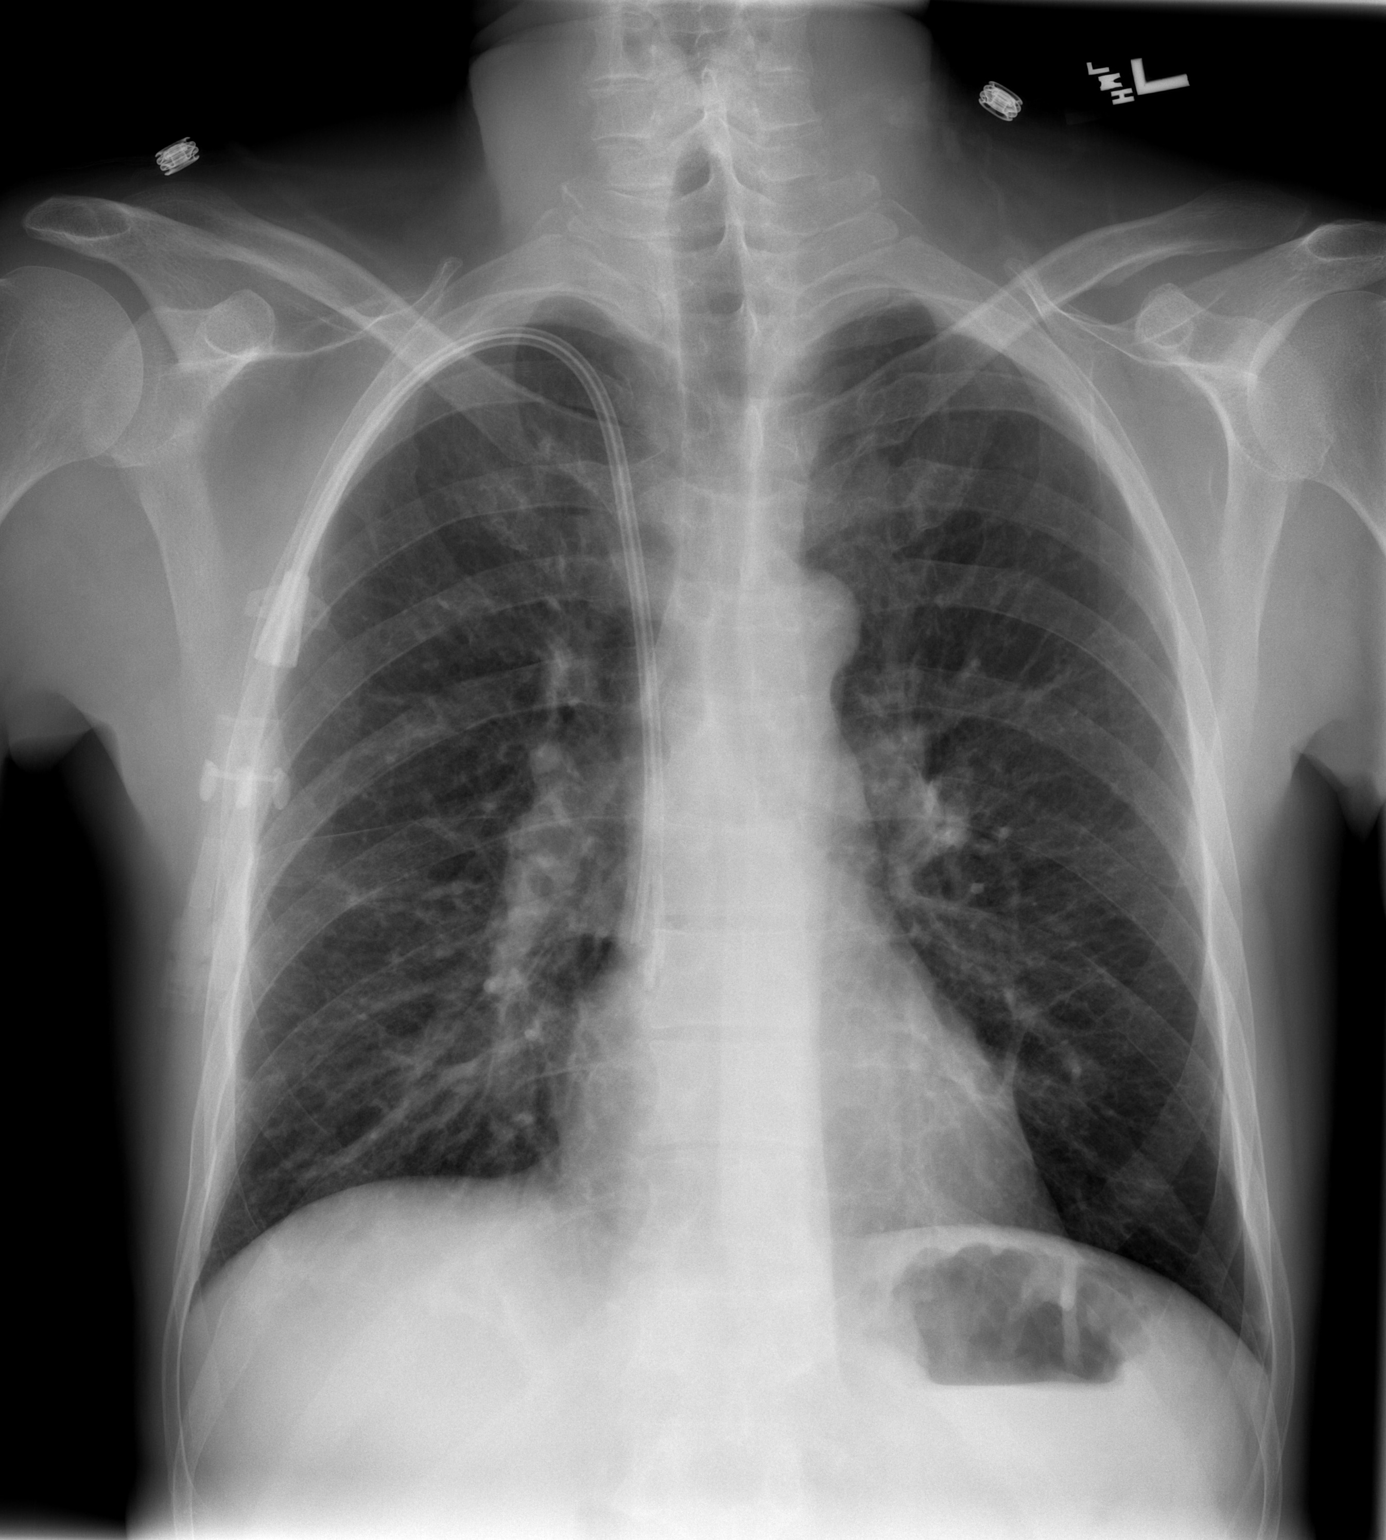

[w chest pa * (1 of 2)]
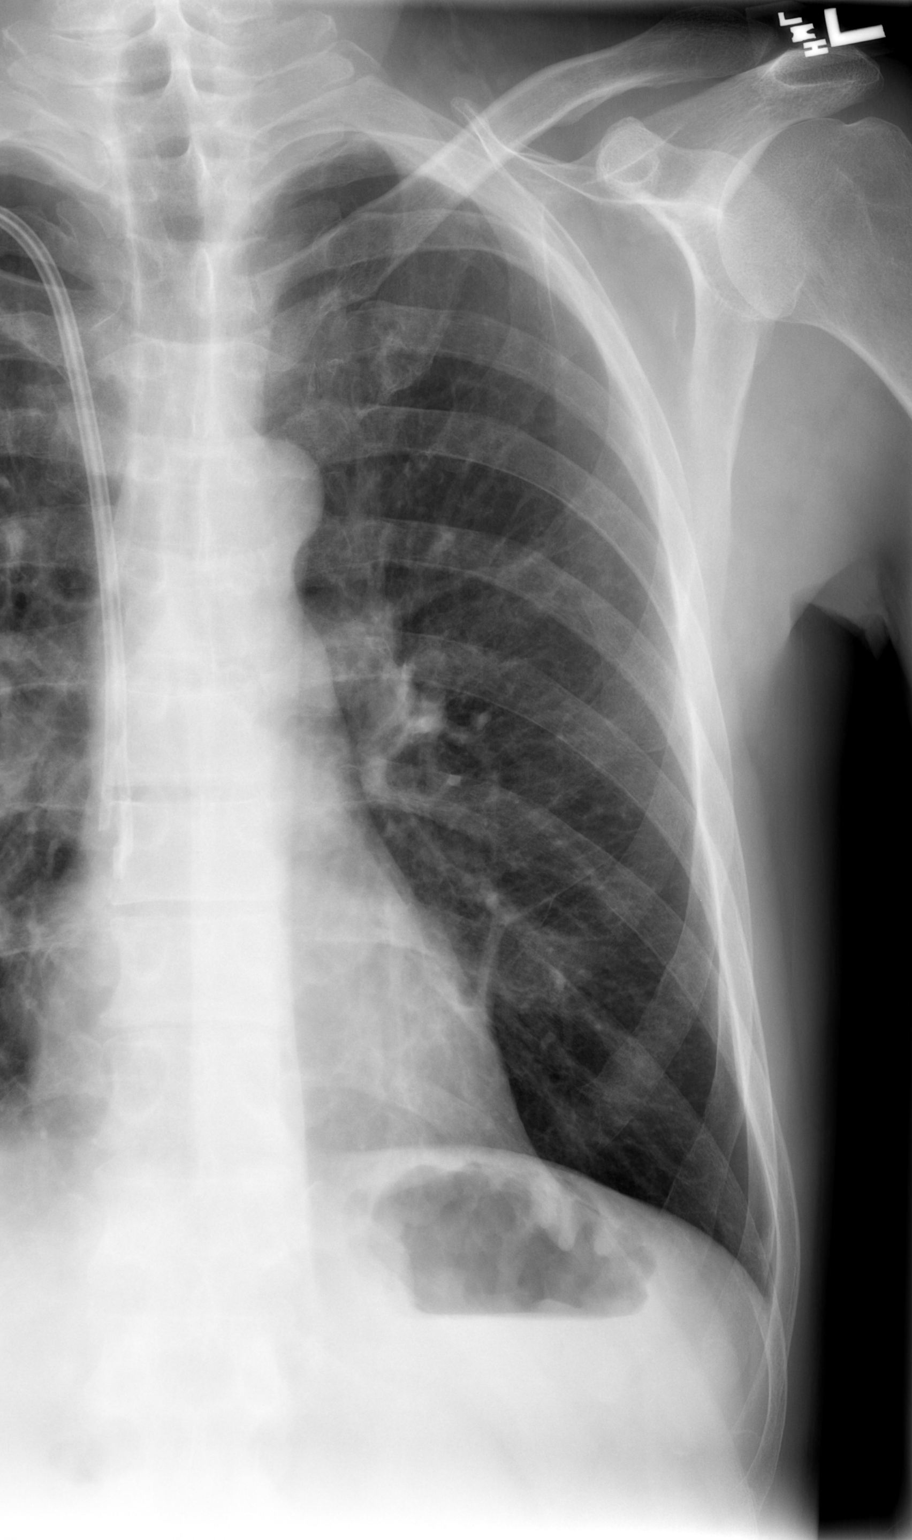

[w chest pa * (2 of 2)]
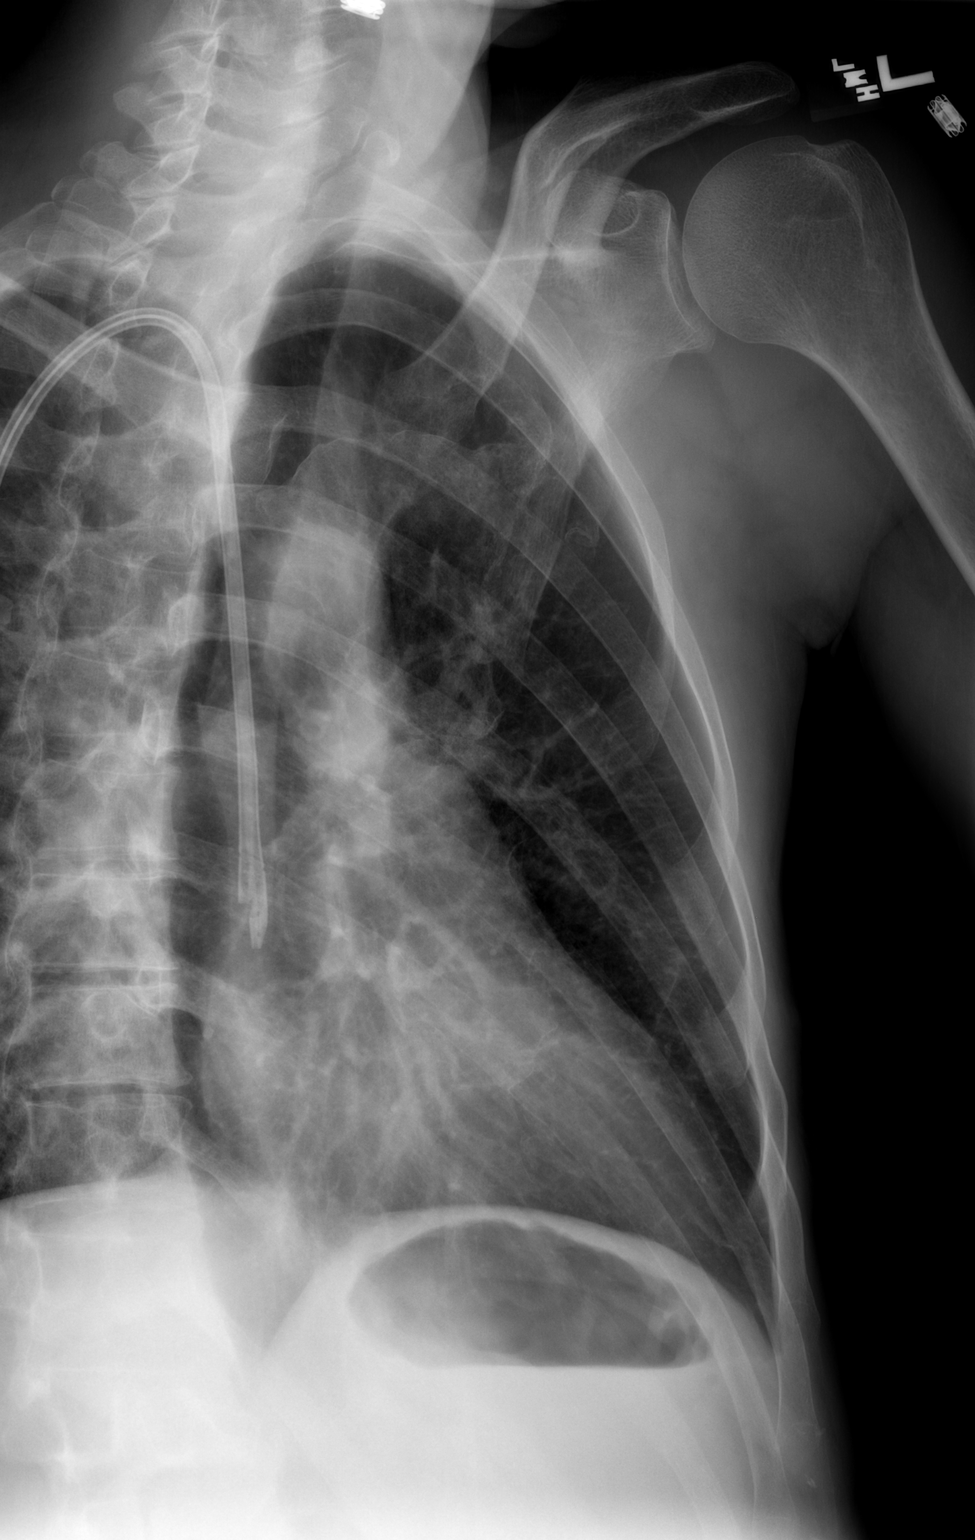

[3 of 3 positions shown; findings below may reference images not displayed]

FINDINGS: Dual lumen right hemodialysis catheter tips over right
atrium.  Heart size is normal.  No pneumothorax.  Lungs are clear
but hyperinflated suggestive of COPD.  No pleural effusion.  No
displaced rib fracture identified.
IMPRESSION: Hyperinflation suggestive of COPD.  No displaced rib fracture
identified.

## 2010-07-06 ENCOUNTER — Ambulatory Visit (HOSPITAL_COMMUNITY)
Admission: AD | Admit: 2010-07-06 | Discharge: 2010-07-06 | Payer: Self-pay | Source: Home / Self Care | Admitting: Vascular Surgery

## 2010-07-09 ENCOUNTER — Ambulatory Visit: Payer: Self-pay

## 2010-07-11 ENCOUNTER — Inpatient Hospital Stay (HOSPITAL_COMMUNITY)
Admission: EM | Admit: 2010-07-11 | Discharge: 2010-07-13 | Payer: Self-pay | Source: Home / Self Care | Attending: Vascular Surgery | Admitting: Vascular Surgery

## 2010-07-11 ENCOUNTER — Encounter (INDEPENDENT_AMBULATORY_CARE_PROVIDER_SITE_OTHER): Payer: Self-pay | Admitting: Emergency Medicine

## 2010-07-17 ENCOUNTER — Telehealth: Payer: Self-pay | Admitting: *Deleted

## 2010-07-18 ENCOUNTER — Ambulatory Visit: Payer: Self-pay | Admitting: Vascular Surgery

## 2010-07-27 ENCOUNTER — Emergency Department (HOSPITAL_COMMUNITY)
Admission: EM | Admit: 2010-07-27 | Discharge: 2010-07-28 | Payer: Self-pay | Source: Home / Self Care | Admitting: Emergency Medicine

## 2010-07-28 ENCOUNTER — Emergency Department (HOSPITAL_COMMUNITY)
Admission: EM | Admit: 2010-07-28 | Discharge: 2010-07-28 | Payer: Self-pay | Source: Home / Self Care | Admitting: Emergency Medicine

## 2010-07-29 IMAGING — CR DG CHEST 2V
2 series · 2 of 2 positions shown · non-contrast
Comparison: 12/24/2009

CLINICAL DATA: Nausea

CHEST - 2 VIEW

[w chest pa]
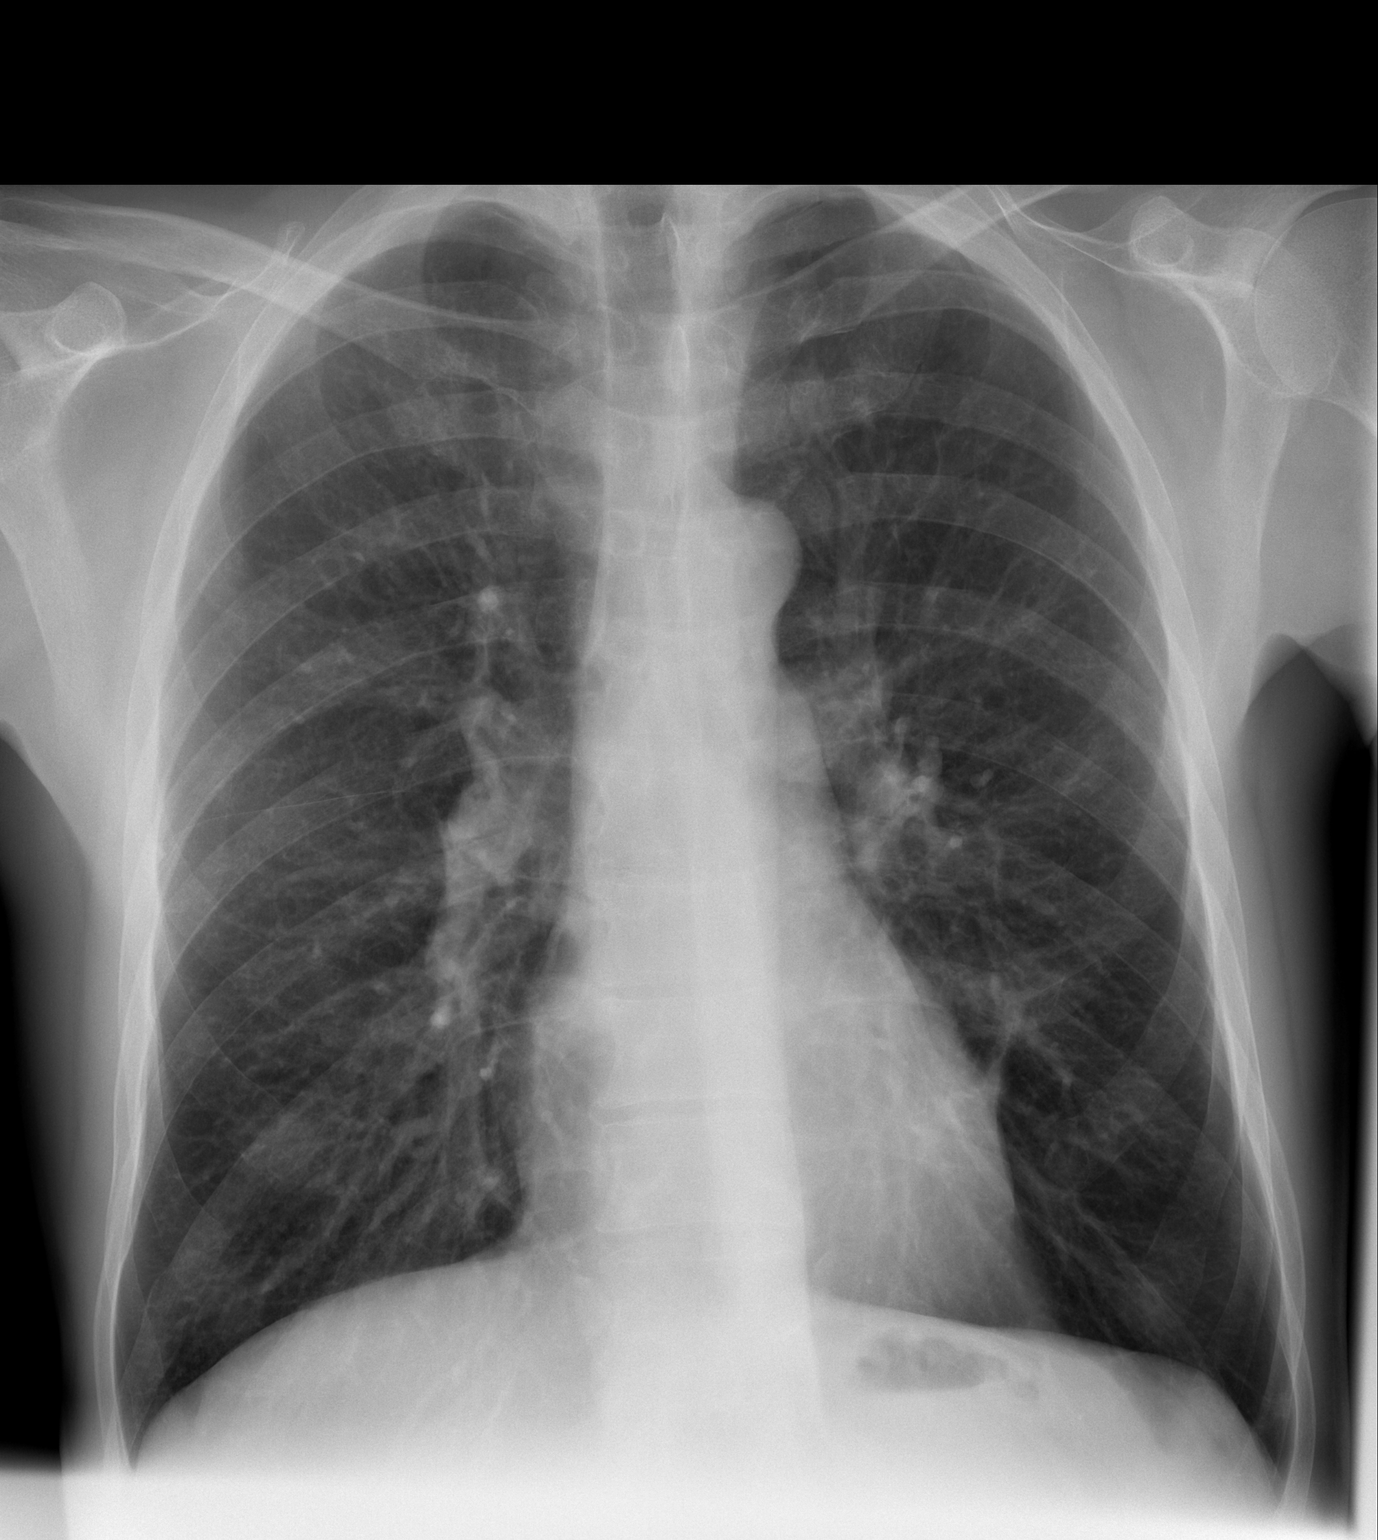

[w chest lat]
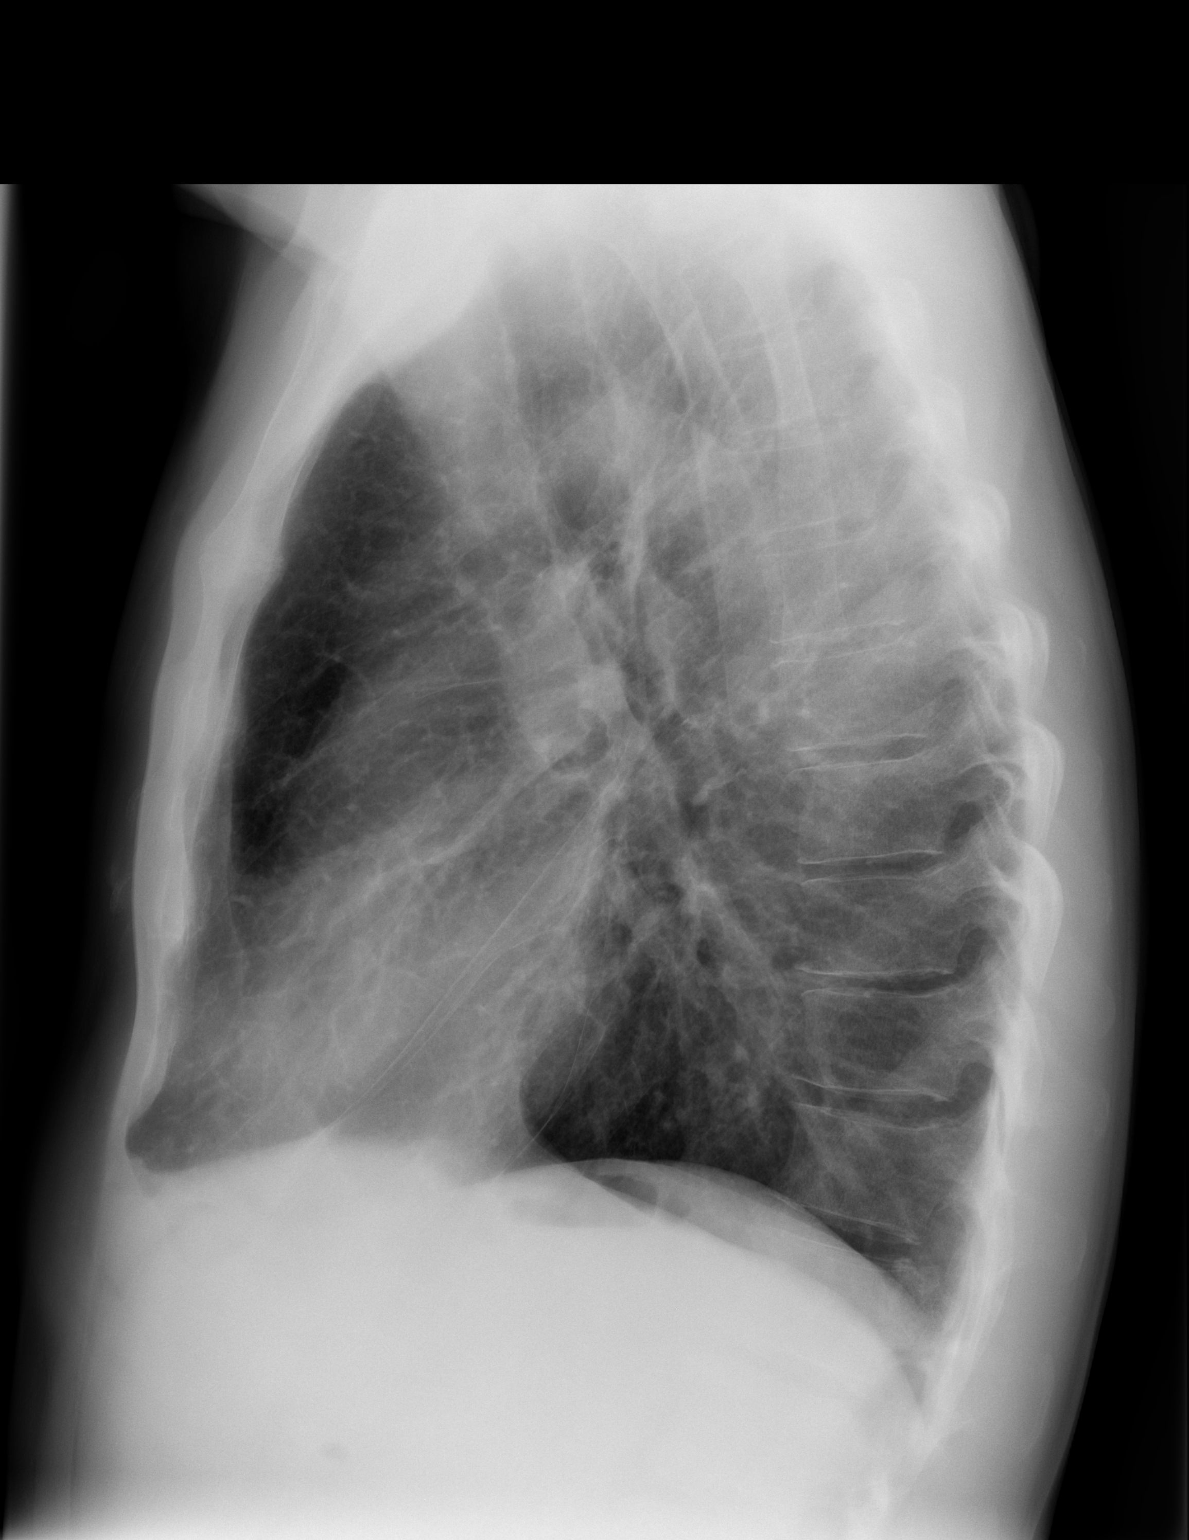

[2 of 2 positions shown; findings below may reference images not displayed]

FINDINGS: Normal heart size.  Lungs are hyperaerated.  Dialysis
catheters been removed.  No mass or consolidation.  No pneumothorax
or pleural
IMPRESSION: No active cardiopulmonary disease.  Hyperaeration.

## 2010-08-02 ENCOUNTER — Ambulatory Visit: Payer: Self-pay | Admitting: Vascular Surgery

## 2010-08-03 ENCOUNTER — Inpatient Hospital Stay (HOSPITAL_COMMUNITY)
Admission: EM | Admit: 2010-08-03 | Discharge: 2010-08-10 | Payer: Self-pay | Source: Home / Self Care | Attending: Internal Medicine | Admitting: Internal Medicine

## 2010-08-03 ENCOUNTER — Encounter: Payer: Self-pay | Admitting: Ophthalmology

## 2010-08-05 DIAGNOSIS — J9690 Respiratory failure, unspecified, unspecified whether with hypoxia or hypercapnia: Secondary | ICD-10-CM

## 2010-08-05 HISTORY — DX: Respiratory failure, unspecified, unspecified whether with hypoxia or hypercapnia: J96.90

## 2010-08-08 LAB — RENAL FUNCTION PANEL
Albumin: 2.7 g/dL — ABNORMAL LOW (ref 3.5–5.2)
BUN: 33 mg/dL — ABNORMAL HIGH (ref 6–23)
CO2: 23 mEq/L (ref 19–32)
Calcium: 8.7 mg/dL (ref 8.4–10.5)
Chloride: 99 mEq/L (ref 96–112)
Creatinine, Ser: 7.05 mg/dL — ABNORMAL HIGH (ref 0.4–1.5)
GFR calc Af Amer: 10 mL/min — ABNORMAL LOW (ref >60)
GFR calc non Af Amer: 8 mL/min — ABNORMAL LOW (ref >60)
Glucose, Bld: 153 mg/dL — ABNORMAL HIGH (ref 70–99)
Phosphorus: 4.8 mg/dL — ABNORMAL HIGH (ref 2.3–4.6)
Potassium: 4.5 mEq/L (ref 3.5–5.1)
Sodium: 134 mEq/L — ABNORMAL LOW (ref 135–145)

## 2010-08-08 LAB — CBC
HCT: 33.1 % — ABNORMAL LOW (ref 39.0–52.0)
Hemoglobin: 10.7 g/dL — ABNORMAL LOW (ref 13.0–17.0)
MCH: 30.7 pg (ref 26.0–34.0)
MCHC: 32.3 g/dL (ref 30.0–36.0)
MCV: 95.1 fL (ref 78.0–100.0)
Platelets: 107 10*3/uL — ABNORMAL LOW (ref 150–400)
RBC: 3.48 MIL/uL — ABNORMAL LOW (ref 4.22–5.81)
RDW: 15.5 % (ref 11.5–15.5)
WBC: 4.5 10*3/uL (ref 4.0–10.5)

## 2010-08-15 ENCOUNTER — Emergency Department (HOSPITAL_COMMUNITY)
Admission: EM | Admit: 2010-08-15 | Discharge: 2010-08-16 | Disposition: A | Discharge status: Other Acute Inpatient Hospital | Payer: Self-pay | Source: Home / Self Care | Admitting: Emergency Medicine

## 2010-08-16 ENCOUNTER — Encounter: Payer: Self-pay | Admitting: Internal Medicine

## 2010-08-16 ENCOUNTER — Inpatient Hospital Stay (HOSPITAL_COMMUNITY)
Admission: EM | Admit: 2010-08-16 | Discharge: 2010-08-20 | Disposition: A | Discharge status: Other Acute Inpatient Hospital | Payer: Self-pay | Attending: Internal Medicine | Admitting: Internal Medicine

## 2010-08-20 ENCOUNTER — Inpatient Hospital Stay (HOSPITAL_COMMUNITY)
Admission: AD | Admit: 2010-08-20 | Discharge: 2010-08-25 | Payer: Self-pay | Source: Ambulatory Visit | Attending: Psychiatry | Admitting: Psychiatry

## 2010-08-20 LAB — LIPASE, BLOOD
Lipase: 91 U/L — ABNORMAL HIGH (ref 11–59)
Lipase: 54 U/L (ref 11–59)

## 2010-08-20 LAB — ETHANOL
Alcohol, Ethyl (B): 389 mg/dL — ABNORMAL HIGH (ref 0–10)
Alcohol, Ethyl (B): 337 mg/dL — ABNORMAL HIGH (ref 0–10)
Alcohol, Ethyl (B): 282 mg/dL — ABNORMAL HIGH (ref 0–10)

## 2010-08-20 LAB — HEPATIC FUNCTION PANEL
ALT: 478 U/L — ABNORMAL HIGH (ref 0–53)
ALT: 569 U/L — ABNORMAL HIGH (ref 0–53)
AST: 1195 U/L — ABNORMAL HIGH (ref 0–37)
AST: 1131 U/L — ABNORMAL HIGH (ref 0–37)
Albumin: 2.8 g/dL — ABNORMAL LOW (ref 3.5–5.2)
Albumin: 2.8 g/dL — ABNORMAL LOW (ref 3.5–5.2)
Alkaline Phosphatase: 68 U/L (ref 39–117)
Alkaline Phosphatase: 78 U/L (ref 39–117)
Bilirubin, Direct: 0.1 mg/dL (ref 0.0–0.3)
Bilirubin, Direct: 0.3 mg/dL (ref 0.0–0.3)
Indirect Bilirubin: 0.5 mg/dL (ref 0.3–0.9)
Indirect Bilirubin: 1.2 mg/dL — ABNORMAL HIGH (ref 0.3–0.9)
Total Bilirubin: 0.6 mg/dL (ref 0.3–1.2)
Total Bilirubin: 1.5 mg/dL — ABNORMAL HIGH (ref 0.3–1.2)
Total Protein: 6.7 g/dL (ref 6.0–8.3)
Total Protein: 6.2 g/dL (ref 6.0–8.3)

## 2010-08-20 LAB — BASIC METABOLIC PANEL
BUN: 85 mg/dL — ABNORMAL HIGH (ref 6–23)
BUN: 91 mg/dL — ABNORMAL HIGH (ref 6–23)
CO2: 22 mEq/L (ref 19–32)
CO2: 22 mEq/L (ref 19–32)
Calcium: 8.2 mg/dL — ABNORMAL LOW (ref 8.4–10.5)
Calcium: 8 mg/dL — ABNORMAL LOW (ref 8.4–10.5)
Chloride: 104 mEq/L (ref 96–112)
Chloride: 104 mEq/L (ref 96–112)
Creatinine, Ser: 9.86 mg/dL — ABNORMAL HIGH (ref 0.4–1.5)
Creatinine, Ser: 9.81 mg/dL — ABNORMAL HIGH (ref 0.4–1.5)
GFR calc Af Amer: 7 mL/min — ABNORMAL LOW (ref >60)
GFR calc Af Amer: 7 mL/min — ABNORMAL LOW (ref >60)
GFR calc non Af Amer: 6 mL/min — ABNORMAL LOW (ref >60)
GFR calc non Af Amer: 6 mL/min — ABNORMAL LOW (ref >60)
Glucose, Bld: 92 mg/dL (ref 70–99)
Glucose, Bld: 99 mg/dL (ref 70–99)
Potassium: 4.3 mEq/L (ref 3.5–5.1)
Potassium: 5.2 mEq/L — ABNORMAL HIGH (ref 3.5–5.1)
Sodium: 138 mEq/L (ref 135–145)
Sodium: 140 mEq/L (ref 135–145)

## 2010-08-20 LAB — RENAL FUNCTION PANEL
Albumin: 2.8 g/dL — ABNORMAL LOW (ref 3.5–5.2)
Albumin: 2.8 g/dL — ABNORMAL LOW (ref 3.5–5.2)
BUN: 36 mg/dL — ABNORMAL HIGH (ref 6–23)
BUN: 43 mg/dL — ABNORMAL HIGH (ref 6–23)
CO2: 27 mEq/L (ref 19–32)
CO2: 24 mEq/L (ref 19–32)
Calcium: 8.5 mg/dL (ref 8.4–10.5)
Calcium: 8.1 mg/dL — ABNORMAL LOW (ref 8.4–10.5)
Chloride: 100 mEq/L (ref 96–112)
Chloride: 96 mEq/L (ref 96–112)
Creatinine, Ser: 5.32 mg/dL — ABNORMAL HIGH (ref 0.4–1.5)
Creatinine, Ser: 5.88 mg/dL — ABNORMAL HIGH (ref 0.4–1.5)
GFR calc Af Amer: 14 mL/min — ABNORMAL LOW (ref >60)
GFR calc Af Amer: 12 mL/min — ABNORMAL LOW (ref >60)
GFR calc non Af Amer: 11 mL/min — ABNORMAL LOW (ref >60)
GFR calc non Af Amer: 10 mL/min — ABNORMAL LOW (ref >60)
Glucose, Bld: 88 mg/dL (ref 70–99)
Glucose, Bld: 217 mg/dL — ABNORMAL HIGH (ref 70–99)
Phosphorus: 6.1 mg/dL — ABNORMAL HIGH (ref 2.3–4.6)
Phosphorus: 6.2 mg/dL — ABNORMAL HIGH (ref 2.3–4.6)
Potassium: 4.1 mEq/L (ref 3.5–5.1)
Potassium: 4.4 mEq/L (ref 3.5–5.1)
Sodium: 138 mEq/L (ref 135–145)
Sodium: 131 mEq/L — ABNORMAL LOW (ref 135–145)

## 2010-08-20 LAB — RAPID URINE DRUG SCREEN, HOSP PERFORMED
Amphetamines: NOT DETECTED
Barbiturates: NOT DETECTED
Benzodiazepines: NOT DETECTED
Cocaine: NOT DETECTED
Opiates: NOT DETECTED
Tetrahydrocannabinol: NOT DETECTED

## 2010-08-20 LAB — TRICYCLICS SCREEN, URINE: TCA Scrn: NOT DETECTED

## 2010-08-20 LAB — AMMONIA: Ammonia: 26 umol/L (ref 11–35)

## 2010-08-20 LAB — DIFFERENTIAL
Basophils Absolute: 0.1 10*3/uL (ref 0.0–0.1)
Basophils Relative: 1 % (ref 0–1)
Eosinophils Absolute: 0 10*3/uL (ref 0.0–0.7)
Eosinophils Relative: 0 % (ref 0–5)
Lymphocytes Relative: 43 % (ref 12–46)
Lymphs Abs: 2.1 10*3/uL (ref 0.7–4.0)
Monocytes Absolute: 0.6 10*3/uL (ref 0.1–1.0)
Monocytes Relative: 13 % — ABNORMAL HIGH (ref 3–12)
Neutro Abs: 2 10*3/uL (ref 1.7–7.7)
Neutrophils Relative %: 42 % — ABNORMAL LOW (ref 43–77)

## 2010-08-20 LAB — LIPID PANEL
Cholesterol: 137 mg/dL (ref 0–200)
HDL: 71 mg/dL (ref >39)
LDL Cholesterol: 39 mg/dL (ref 0–99)
Total CHOL/HDL Ratio: 1.9 RATIO
Triglycerides: 137 mg/dL (ref <150)
VLDL: 27 mg/dL (ref 0–40)

## 2010-08-20 LAB — CBC
HCT: 31.7 % — ABNORMAL LOW (ref 39.0–52.0)
HCT: 30.7 % — ABNORMAL LOW (ref 39.0–52.0)
Hemoglobin: 10.7 g/dL — ABNORMAL LOW (ref 13.0–17.0)
Hemoglobin: 10.4 g/dL — ABNORMAL LOW (ref 13.0–17.0)
MCH: 31.1 pg (ref 26.0–34.0)
MCH: 30.9 pg (ref 26.0–34.0)
MCHC: 33.8 g/dL (ref 30.0–36.0)
MCHC: 33.9 g/dL (ref 30.0–36.0)
MCV: 92.2 fL (ref 78.0–100.0)
MCV: 91.1 fL (ref 78.0–100.0)
Platelets: 289 10*3/uL (ref 150–400)
Platelets: 164 10*3/uL (ref 150–400)
RBC: 3.44 MIL/uL — ABNORMAL LOW (ref 4.22–5.81)
RBC: 3.37 MIL/uL — ABNORMAL LOW (ref 4.22–5.81)
RDW: 15.3 % (ref 11.5–15.5)
RDW: 14.9 % (ref 11.5–15.5)
WBC: 4.8 10*3/uL (ref 4.0–10.5)
WBC: 3 10*3/uL — ABNORMAL LOW (ref 4.0–10.5)

## 2010-08-20 LAB — MRSA PCR SCREENING: MRSA by PCR: NEGATIVE

## 2010-08-20 LAB — ACETAMINOPHEN LEVEL: Acetaminophen (Tylenol), Serum: <10 ug/mL — ABNORMAL LOW (ref 10–30)

## 2010-08-20 LAB — SALICYLATE LEVEL: Salicylate Lvl: <4 mg/dL (ref 2.8–20.0)

## 2010-08-22 ENCOUNTER — Encounter (HOSPITAL_COMMUNITY): Admission: RE | Admit: 2010-08-22 | Payer: Self-pay | Admitting: Psychiatry

## 2010-08-22 ENCOUNTER — Ambulatory Visit (HOSPITAL_COMMUNITY)
Admission: RE | Admit: 2010-08-22 | Discharge: 2010-08-22 | Payer: Self-pay | Attending: Nephrology | Admitting: Nephrology

## 2010-08-22 LAB — RENAL FUNCTION PANEL
Albumin: 2.7 g/dL — ABNORMAL LOW (ref 3.5–5.2)
BUN: 58 mg/dL — ABNORMAL HIGH (ref 6–23)
CO2: 21 mEq/L (ref 19–32)
Calcium: 8.5 mg/dL (ref 8.4–10.5)
Chloride: 99 mEq/L (ref 96–112)
Creatinine, Ser: 6.78 mg/dL — ABNORMAL HIGH (ref 0.4–1.5)
GFR calc Af Amer: 10 mL/min — ABNORMAL LOW (ref >60)
GFR calc non Af Amer: 9 mL/min — ABNORMAL LOW (ref >60)
Glucose, Bld: 122 mg/dL — ABNORMAL HIGH (ref 70–99)
Phosphorus: 3 mg/dL (ref 2.3–4.6)
Potassium: 4.8 mEq/L (ref 3.5–5.1)
Sodium: 132 mEq/L — ABNORMAL LOW (ref 135–145)

## 2010-08-22 LAB — CBC
HCT: 26.8 % — ABNORMAL LOW (ref 39.0–52.0)
Hemoglobin: 9 g/dL — ABNORMAL LOW (ref 13.0–17.0)
MCH: 31 pg (ref 26.0–34.0)
MCHC: 33.6 g/dL (ref 30.0–36.0)
MCV: 92.4 fL (ref 78.0–100.0)
Platelets: 121 10*3/uL — ABNORMAL LOW (ref 150–400)
RBC: 2.9 MIL/uL — ABNORMAL LOW (ref 4.22–5.81)
RDW: 14.7 % (ref 11.5–15.5)
WBC: 3.5 10*3/uL — ABNORMAL LOW (ref 4.0–10.5)

## 2010-08-26 ENCOUNTER — Encounter: Payer: Self-pay | Admitting: Nephrology

## 2010-08-26 ENCOUNTER — Encounter: Payer: Self-pay | Admitting: Emergency Medicine

## 2010-08-27 LAB — RENAL FUNCTION PANEL
Albumin: 2.6 g/dL — ABNORMAL LOW (ref 3.5–5.2)
BUN: 47 mg/dL — ABNORMAL HIGH (ref 6–23)
Calcium: 8.8 mg/dL (ref 8.4–10.5)
Chloride: 101 mEq/L (ref 96–112)
GFR calc Af Amer: 11 mL/min — ABNORMAL LOW (ref >60)
GFR calc Af Amer: 14 mL/min — ABNORMAL LOW (ref >60)
GFR calc non Af Amer: 9 mL/min — ABNORMAL LOW (ref >60)
GFR calc non Af Amer: 11 mL/min — ABNORMAL LOW (ref >60)
Phosphorus: <1 mg/dL — CL (ref 2.3–4.6)
Phosphorus: 2.4 mg/dL (ref 2.3–4.6)
Potassium: 5.3 mEq/L — ABNORMAL HIGH (ref 3.5–5.1)
Potassium: 5.3 mEq/L — ABNORMAL HIGH (ref 3.5–5.1)
Sodium: 134 mEq/L — ABNORMAL LOW (ref 135–145)
Sodium: 134 mEq/L — ABNORMAL LOW (ref 135–145)

## 2010-08-27 LAB — CBC
HCT: 25 % — ABNORMAL LOW (ref 39.0–52.0)
Hemoglobin: 8.2 g/dL — ABNORMAL LOW (ref 13.0–17.0)
MCHC: 32.8 g/dL (ref 30.0–36.0)
Platelets: 124 10*3/uL — ABNORMAL LOW (ref 150–400)
RBC: 2.48 MIL/uL — ABNORMAL LOW (ref 4.22–5.81)
RDW: 15.1 % (ref 11.5–15.5)
RDW: 16.1 % — ABNORMAL HIGH (ref 11.5–15.5)
WBC: 4.2 10*3/uL (ref 4.0–10.5)
WBC: 3.2 10*3/uL — ABNORMAL LOW (ref 4.0–10.5)

## 2010-08-27 LAB — IRON AND TIBC: TIBC: 230 ug/dL (ref 215–435)

## 2010-08-27 LAB — FERRITIN: Ferritin: 1500 ng/mL — ABNORMAL HIGH (ref 22–322)

## 2010-08-28 LAB — CROSSMATCH
ABO/RH(D): O NEG
Antibody Screen: NEGATIVE
Unit division: 0

## 2010-08-28 LAB — HEMOGLOBIN AND HEMATOCRIT, BLOOD
HCT: 29 % — ABNORMAL LOW (ref 39.0–52.0)
Hemoglobin: 9.7 g/dL — ABNORMAL LOW (ref 13.0–17.0)

## 2010-08-31 NOTE — Discharge Summary (Signed)
  [name], [name]               ACCOUNT NO.:  [id number]  MEDICAL RECORD NO.:  [id number]          PATIENT TYPE:  IPS  LOCATION:  0405                          FACILITY:  [facility]  PHYSICIAN:  Anselm Jungling, MD  DATE OF BIRTH:  06-03-1956  DATE OF ADMISSION:  08/20/2010 DATE OF DISCHARGE:  08/25/2010                              DISCHARGE SUMMARY   IDENTIFYING DATA/REASON FOR ADMISSION:  This was an inpatient psychiatric admission for [name], a 55 year old, single Caucasian male who was admitted because of alcohol dependence.  Please refer to the Admission Note for further details pertaining to the symptoms, circumstances and history that led to his hospitalization.  He was given an initial Axis I diagnosis of alcohol dependence.  MEDICAL/LABORATORY:  The patient was medically and physically assessed by the psychiatric nurse practitioner.  He was continued on his usual Monday, Wednesday, Friday dialysis regimen, end-stage renal disease. His vein graft did need revision while he was with [place], but this proceeded uneventfully, at [facility].  He was continued on his usual regimen of K-Phos, calcium acetate, folate, and, for COPD/asthma, Spiriva and Ventolin.  HOSPITAL COURSE:  The patient was admitted to the Adult Inpatient Psychiatric Service.  He presented as a thin, chronically ill-appearing gentleman who readily admitted that he had had a problem with alcohol his whole life, and needed to stop drinking.  He had been drinking a half gallon of vodka daily for years and years.  His last alcoholic drink had been two to three days prior to admission.  He was treated with a Librium withdrawal protocol. This proceeded relatively uneventfully.  He worked with Case Management and the Treatment Team towards an aftercare plan that would address his needs for sober recovery.  The patient did benefit from a low-dose trial of haloperidol 2 mg t.i.d. to address anxiety,  agitation, and the patient indicated that he wanted to continue this at the time of discharge.  The patient was discharged on the sixth hospital day, and agreed to the following aftercare plan.  AFTERCARE:  The patient was to follow up at [facility], with an appointment on August 27, 2010 at 10 a.m.  He was encouraged to attend Alcoholics Anonymous.  DISCHARGE MEDICATIONS: 1. Haldol 2 mg t.i.d. 2. Zofran 4 mg q.6h. p.r.n. nausea. 3. K-Phos 250 mg q.i.d. 4. Calcium acetate 667 mg t.i.d. 5. Folate 1 mg daily. 6. Spiriva one capsule daily. 7. Ventolin two puffs q.4h. p.r.n. symptoms.  The patient was referred to his usual nephrologist and dialysis care providers.  DISCHARGE DIAGNOSES:  AXIS I: Alcohol dependence, early remission; substance-induced mood disorder. AXIS II: Deferred. AXIS III: Renal disease, on dialysis; chronic obstructive pulmonary disease/asthma. AXIS IV: Stressors severe. AXIS V: Global Assessment of Functioning on discharge 50.     Anselm Jungling, MD     SPB/MEDQ  D:  08/28/2010  T:  08/28/2010  Job:  [id number]  Electronically Signed by Geralyn Flash MD on 08/29/2010 08:46:10 AM

## 2010-09-03 ENCOUNTER — Encounter: Payer: Self-pay | Admitting: Ophthalmology

## 2010-09-03 ENCOUNTER — Inpatient Hospital Stay (HOSPITAL_COMMUNITY)
Admission: EM | Admit: 2010-09-03 | Discharge: 2010-09-13 | DRG: 896 | Disposition: A | Discharge status: Home / Self Care | Payer: Medicare Other | Attending: Infectious Diseases | Admitting: Infectious Diseases

## 2010-09-03 DIAGNOSIS — K2970 Gastritis, unspecified, without bleeding: Secondary | ICD-10-CM | POA: Diagnosis present

## 2010-09-03 DIAGNOSIS — E875 Hyperkalemia: Secondary | ICD-10-CM | POA: Diagnosis present

## 2010-09-03 DIAGNOSIS — F10931 Alcohol use, unspecified with withdrawal delirium: Principal | ICD-10-CM | POA: Diagnosis present

## 2010-09-03 DIAGNOSIS — J189 Pneumonia, unspecified organism: Secondary | ICD-10-CM | POA: Diagnosis present

## 2010-09-03 DIAGNOSIS — G9340 Encephalopathy, unspecified: Secondary | ICD-10-CM | POA: Diagnosis present

## 2010-09-03 DIAGNOSIS — K209 Esophagitis, unspecified without bleeding: Secondary | ICD-10-CM | POA: Diagnosis present

## 2010-09-03 DIAGNOSIS — R680 Hypothermia, not associated with low environmental temperature: Secondary | ICD-10-CM | POA: Diagnosis not present

## 2010-09-03 DIAGNOSIS — D696 Thrombocytopenia, unspecified: Secondary | ICD-10-CM | POA: Diagnosis present

## 2010-09-03 DIAGNOSIS — N2581 Secondary hyperparathyroidism of renal origin: Secondary | ICD-10-CM | POA: Diagnosis present

## 2010-09-03 DIAGNOSIS — J690 Pneumonitis due to inhalation of food and vomit: Secondary | ICD-10-CM | POA: Diagnosis present

## 2010-09-03 DIAGNOSIS — F1020 Alcohol dependence, uncomplicated: Secondary | ICD-10-CM | POA: Diagnosis present

## 2010-09-03 DIAGNOSIS — F1010 Alcohol abuse, uncomplicated: Secondary | ICD-10-CM | POA: Diagnosis present

## 2010-09-03 DIAGNOSIS — B1920 Unspecified viral hepatitis C without hepatic coma: Secondary | ICD-10-CM | POA: Diagnosis present

## 2010-09-03 DIAGNOSIS — Z681 Body mass index (BMI) 19 or less, adult: Secondary | ICD-10-CM

## 2010-09-03 DIAGNOSIS — E872 Acidosis, unspecified: Secondary | ICD-10-CM | POA: Diagnosis present

## 2010-09-03 DIAGNOSIS — D649 Anemia, unspecified: Secondary | ICD-10-CM | POA: Diagnosis present

## 2010-09-03 DIAGNOSIS — F10231 Alcohol dependence with withdrawal delirium: Principal | ICD-10-CM | POA: Diagnosis present

## 2010-09-03 DIAGNOSIS — A4902 Methicillin resistant Staphylococcus aureus infection, unspecified site: Secondary | ICD-10-CM | POA: Diagnosis present

## 2010-09-03 DIAGNOSIS — E46 Unspecified protein-calorie malnutrition: Secondary | ICD-10-CM | POA: Diagnosis present

## 2010-09-03 DIAGNOSIS — J449 Chronic obstructive pulmonary disease, unspecified: Secondary | ICD-10-CM | POA: Diagnosis present

## 2010-09-03 DIAGNOSIS — J4489 Other specified chronic obstructive pulmonary disease: Secondary | ICD-10-CM | POA: Diagnosis present

## 2010-09-03 DIAGNOSIS — I120 Hypertensive chronic kidney disease with stage 5 chronic kidney disease or end stage renal disease: Secondary | ICD-10-CM | POA: Diagnosis present

## 2010-09-03 DIAGNOSIS — A419 Sepsis, unspecified organism: Secondary | ICD-10-CM | POA: Diagnosis present

## 2010-09-03 DIAGNOSIS — J9600 Acute respiratory failure, unspecified whether with hypoxia or hypercapnia: Secondary | ICD-10-CM | POA: Diagnosis present

## 2010-09-03 DIAGNOSIS — K859 Acute pancreatitis without necrosis or infection, unspecified: Secondary | ICD-10-CM | POA: Diagnosis present

## 2010-09-03 DIAGNOSIS — R000 Tachycardia, unspecified: Secondary | ICD-10-CM | POA: Diagnosis present

## 2010-09-03 DIAGNOSIS — I808 Phlebitis and thrombophlebitis of other sites: Secondary | ICD-10-CM | POA: Diagnosis present

## 2010-09-03 DIAGNOSIS — N186 End stage renal disease: Secondary | ICD-10-CM | POA: Diagnosis present

## 2010-09-03 LAB — CBC
HCT: 34.3 % — ABNORMAL LOW (ref 39.0–52.0)
MCH: 31.1 pg (ref 26.0–34.0)
MCV: 94.5 fL (ref 78.0–100.0)
MCV: 94.5 fL (ref 78.0–100.0)
Platelets: 143 10*3/uL — ABNORMAL LOW (ref 150–400)
RBC: 3.66 MIL/uL — ABNORMAL LOW (ref 4.22–5.81)
RBC: 3.63 MIL/uL — ABNORMAL LOW (ref 4.22–5.81)
RDW: 18.5 % — ABNORMAL HIGH (ref 11.5–15.5)
WBC: 6.1 10*3/uL (ref 4.0–10.5)

## 2010-09-03 LAB — RAPID URINE DRUG SCREEN, HOSP PERFORMED
Barbiturates: NOT DETECTED
Benzodiazepines: POSITIVE — AB
Opiates: NOT DETECTED

## 2010-09-03 LAB — LIPASE, BLOOD: Lipase: 96 U/L — ABNORMAL HIGH (ref 11–59)

## 2010-09-03 LAB — DIFFERENTIAL
Eosinophils Absolute: 0 10*3/uL (ref 0.0–0.7)
Eosinophils Relative: 0 % (ref 0–5)
Lymphs Abs: 1.5 10*3/uL (ref 0.7–4.0)
Monocytes Relative: 6 % (ref 3–12)

## 2010-09-03 LAB — URINALYSIS, ROUTINE W REFLEX MICROSCOPIC
Leukocytes, UA: NEGATIVE
Nitrite: NEGATIVE
Protein, ur: >300 mg/dL — AB
Specific Gravity, Urine: 1.009 (ref 1.005–1.030)
Urobilinogen, UA: 0.2 mg/dL (ref 0.0–1.0)

## 2010-09-03 LAB — HEPATIC FUNCTION PANEL
ALT: 112 U/L — ABNORMAL HIGH (ref 0–53)
AST: 106 U/L — ABNORMAL HIGH (ref 0–37)
Alkaline Phosphatase: 89 U/L (ref 39–117)
Bilirubin, Direct: 0.2 mg/dL (ref 0.0–0.3)
Total Bilirubin: 0.6 mg/dL (ref 0.3–1.2)

## 2010-09-03 LAB — POTASSIUM: Potassium: 5.6 mEq/L — ABNORMAL HIGH (ref 3.5–5.1)

## 2010-09-03 LAB — POCT I-STAT, CHEM 8
BUN: 96 mg/dL — ABNORMAL HIGH (ref 6–23)
Calcium, Ion: 0.66 mmol/L — CL (ref 1.12–1.32)
Glucose, Bld: 130 mg/dL — ABNORMAL HIGH (ref 70–99)
TCO2: 6 mmol/L (ref 0–100)

## 2010-09-03 LAB — URINE MICROSCOPIC-ADD ON

## 2010-09-04 LAB — COMPREHENSIVE METABOLIC PANEL
CO2: 14 mEq/L — ABNORMAL LOW (ref 19–32)
Calcium: 6.9 mg/dL — ABNORMAL LOW (ref 8.4–10.5)
Creatinine, Ser: 8.44 mg/dL — ABNORMAL HIGH (ref 0.4–1.5)
GFR calc non Af Amer: 7 mL/min — ABNORMAL LOW (ref >60)
Glucose, Bld: 80 mg/dL (ref 70–99)

## 2010-09-04 LAB — CARDIAC PANEL(CRET KIN+CKTOT+MB+TROPI)
CK, MB: 2.6 ng/mL (ref 0.3–4.0)
Relative Index: 1.1 (ref 0.0–2.5)
Troponin I: 0.04 ng/mL (ref 0.00–0.06)

## 2010-09-04 LAB — CBC
HCT: 37.2 % — ABNORMAL LOW (ref 39.0–52.0)
Hemoglobin: 12.2 g/dL — ABNORMAL LOW (ref 13.0–17.0)
MCH: 30.8 pg (ref 26.0–34.0)
MCHC: 32.8 g/dL (ref 30.0–36.0)

## 2010-09-04 NOTE — Initial Assessments (Signed)
INTERNAL MEDICINE ADMISSION HISTORY AND PHYSICAL  First Contact: Dr. Dorthula Rue [phone] Second Contact: Dr. Baltazar Apo [phone] Weekends, [place], or after 5pm weekdays: First Contact: [phone] Second Contact:[phone]  PCP: Dr. Baltazar Apo  CC: abdominal pain  HPI: 55 y/o man with PMH significant for alcohol abuse with multiple admissions for detox, peptic ulcer disease dx with EGD on 10/6, who presented to the ER with the chief complaint of abdominal pain since yesterday. When asked about alcohol ingestion, patient admits to drinking again.   He describes his pain as sharp, stabbing located in the epigastrium region, worst being 10/10. No aggravating or relieving factors. The pain was associated with nausea and vomiting. He also complains of having black stools, about three episodes in total.  The patient was recently discharged on Oct. 9th, 2011 where he was admitted for the same thing. During that hospitalization he had an EGD done which showed linear ulcer in the mid esophagus, a prepyloric ulcer, and a small antral ulcer with antral gastritis and duodenitis. He was transfused 2 units of blood, along with conservative management with PPI two times a day. His bleeding had resolved at time of discharge.   He has no other complaints today and denied chest pain, cough and shortness of breath.   ALLERGIES: NKDA  PAST MEDICAL HISTORY:  Chronic Pancytopenia Polysubstance abuse Chronic Kidney Disease, stage V Malignant Hypertension Hepatitis C COPD  MEDICATIONS:  RENA-VITE RX 1 MG TABS (B COMPLEX-C-FOLIC ACID) Take 1 tablet by mouth once a day CALCIUM ACETATE 667 MG CAPS  Take 1 tablet by mouth three times a day SPIRIVA HANDIHALER 18 MCG CAPS  I CAP INHALED ONCE DAILY. VENTOLIN HFA 108 (90 BASE) MCG/ACT AERS  Inhale 2 puffs ever 4-6 hours as needed for shortness of breath. LORAZEPAM 1 MG TABS  Take 2 tabs by mouth every 4hrs for 1 day, then 2 tabs every 6hrs for 3 days, then 1 tab every  6hrs for 3 days, then 1 tab every 8 hours for 3 days, then stop ARANESP  60 MCG/0.3ML SOLN (DARBEPOETIN ALFA-POLYSORBATE) Use weekly during dialysis VENOFER 20 MG/ML SOLN (IRON SUCROSE) 100mg  Infuse over 30-34min at 105-241mL/hr once weekly during dialysis OXYCODONE HCL 5 MG TABS  Take 1-2 tablets by mouth every 6 hours as needed for pain FOLIC ACID 1 MG TABS (FOLIC ACID) Take 1 tablet by mouth once a day THIAMINE HCL 100 MG TABS (THIAMINE HCL) Take 1 tablet by mouth once a day PANTOPRAZOLE SODIUM 40 MG TBEC  Take 1 tablet by mouth two times a day   SOCIAL HISTORY:  Social History: Unemployed Divorced Current Smoker (1/2pack a day) Alcohol use - drinks vodka     FAMILY HISTORY Family History: Family History of Alcoholism/Addiction Family History of Anxiety Family History High cholesterol Family History Hypertension Family History of Stroke F 1st degree relative <60   ROS: As per HPI  VITALS: T:97.7  P: 93 BP:125/81  R: 24 O2SAT: 94%ON:RA  PHYSICAL EXAM:  Gen: Moderate distress due to pain Eyes: PERRL, EOMI, No signs of anemia or jaundince. ENT: MMM, OP clear, No erythema, thrush or exudates. Neck: Supple, No carotid Bruits, No JVD, No thyromegaly Resp: CTA- Bilaterally, No W/C/R. CVS: S1S2 RRR, No M/R/G GI: Abdomen is soft. tender to palpation in the epigastrium,NG, NR, BS+. No organomegaly. Ext: No pedal edema, cyanosis or clubbing. GU: No CVA tenderness. Skin: No visible rashes, scars. Lymph: No palpable lymphadenopathy. MS: Moving all 4 extremities. Neuro: No focal deficitis Psych: Agitated, tearful  LABS:  WBC                                      4.6               4.0-10.5         K/uL  RBC                                      2.88       l      4.22-5.81        MIL/uL  Hemoglobin (HGB)                         9.0        l      13.0-17.0        g/dL  Hematocrit (HCT)                         28.0       l      39.0-52.0        %  MCV                                       97.2              78.0-100.0       fL  MCH -                                    31.3              26.0-34.0        pg  MCHC                                     32.1              30.0-36.0        g/dL  RDW                                      18.4       h      11.5-15.5        %  Platelet Count (PLT)                     159               150-400          K/uL  Neutrophils, %                           39         l      43-77            %  Lymphocytes, %  50         h      12-46            %  Monocytes, %                             8                 3-12             %  Eosinophils, %                           3                 0-5              %  Basophils, %                             1                 0-1              %  Neutrophils, Absolute                    1.8               1.7-7.7          K/uL  Lymphocytes, Absolute                    2.3               0.7-4.0          K/uL  Monocytes, Absolute                      0.4               0.1-1.0          K/uL  Eosinophils, Absolute                    0.1               0.0-0.7          K/uL  Basophils, Absolute                      0.0               0.0-0.1          K/uL    Sodium (NA)                              132        l      135-145          mEq/L  Potassium (K)                            4.3               3.5-5.1          mEq/L  Chloride  101               96-112           mEq/L  CO2                                      24                19-32            mEq/L  Glucose                                  82                70-99            mg/dL  BUN                                      32         h      6-23             mg/dL  Creatinine                               6.36       h      0.4-1.5          mg/dL  GFR, Est Non African American            9          l      >60              mL/min  GFR, Est African American                11         l      >60               mL/min    Oversized comment, see footnote  1  Bilirubin, Total                         0.4               0.3-1.2          mg/dL  Alkaline Phosphatase                     48                39-117           U/L  SGOT (AST)                               31                0-37             U/L  SGPT (ALT)                               20  0-53             U/L  Total  Protein                           6.8               6.0-8.3          g/dL  Albumin-Blood                            3.6               3.5-5.2          g/dL  Calcium                                  8.2        l      8.4-10.5         mg/dL  Color, Urine                             YELLOW            YELLOW  Appearance                               CLEAR             CLEAR  Specific Gravity                         1.009             1.005-1.030  pH                                       7.0               5.0-8.0  Urine Glucose                            NEGATIVE          NEG              mg/dL  Bilirubin                                NEGATIVE          NEG  Ketones                                  NEGATIVE          NEG              mg/dL  Blood                                    SMALL      a      NEG  Protein  100        a      NEG              mg/dL  Urobilinogen                             0.2               0.0-1.0          mg/dL  Nitrite                                  NEGATIVE          NEG  Leukocytes                               NEGATIVE          NEG  Squamous Epithelial / LPF                RARE              RARE  WBC / HPF                                0-2               <3               WBC/hpf  RBC / HPF                                0-2               <3               RBC/hpf    Alcohol                                  156        h      0-10              ASSESSMENT :  1) Alcohol-induced gastritis:  [name] is our 55 y/o man with epigastric pain and melena most likely  related to his peptic ulcer disease and alcohol induced gastritis. He had an EGD on 10/6 that showed prepyloric ulcer, a small antral ulcer with antral gastritis and duodenitis. His alcohol levels were 156, on admission, so most likely alcohol aggravated his pain. He was advised to abstain from alcohol and was referred to Alcohol Anonymous group but he admits being depressed and therefore could not stop drinking.   The patient was admitted for observation to monitor his Hgb, as it had dropped to 7.8 during the last admission. We will continue to monitor his Hgb during this hospitalization, along with conservative management with PPI.   PLAN- Protonix 40 mg IV two times a day. Start him on thiamine and folic acid to prevent wernicke's encephalopathy. Placed him on CIWA protocol.   I performed and/or observed a history and physical examination of the patient.  I discussed the case with the residents as noted and reviewed the residents' notes.  I agree with the findings and plan--please refer to  the attending physician note for more details.   Signature Printed Name

## 2010-09-04 NOTE — Initial Assessments (Signed)
INTERNAL MEDICINE ADMISSION HISTORY AND PHYSICAL  PCP: Dr. Baltazar Apo  1st contact Dr Gilford Rile [phone] 2nd contact Dr Sherryll Burger [phone] Holidays or 5pm on weekdays:  1st contact [phone] 2nd contact [phone]  CC: Abdominal pain  HPI: 55 year old male with PMH significant for ESRD, HCV, COPD, PSA (including ETOH) and HTN who presents with a chief complaint of Left flank pain that radiates to the Right flank, is about 7/10 in intensity, which is aggravated by walking, relieved by sitting, and is associated with some nausea, and an episode of vomiting, the patient is somewhat uncooperative 2/2 to EToH intoxication. Patient also c/o of neck pain 2/2 to boil at C5 on his neck. Patient denies fever, chills, CP, SOB, also denies falls, and stated that he called 911 2/2 to severe abdominal pain which has been ongoing for several weeks now. Patient also c/o suicidal ideation, especially when he is drunk, currently denies suicidal ideation, and said that he has been drinking more over the past several month due to his depression 2/2 to medical problems.   When I called the patients ALF, the director informed me that the patient was upset because the worker in the ALF that used to bring him alcohol into the ALF was fired, therefore the patient got drunk with the alcohol he had left and called 911. The ALF director informed me about the patient's manipulative behavior, and drug seeking, she also stated that he is happy, and does not display suicidal ideation.   ALLERGIES: NKDA   PAST MEDICAL HISTORY: Chronic Pancytopenia Polysubstance abuse Chronic Kidney Disease, stage V Malignant Hypertension Hepatitis C COPD  MEDICATIONS: RENA-VITE RX 1 MG TABS (B COMPLEX-C-FOLIC ACID) Take 1 tablet by mouth once a day CALCIUM ACETATE 667 MG CAPS (CALCIUM ACETATE (PHOS BINDER)) Take 1 tablet by mouth three times a day ACETAMINOPHEN 325 MG TABS (ACETAMINOPHEN) Take 1 to 2 tablets by mouth three times a day as needed  for pain SPIRIVA HANDIHALER 18 MCG CAPS (TIOTROPIUM BROMIDE MONOHYDRATE) I CAP INHALED ONCE DAILY. VENTOLIN HFA 108 (90 BASE) MCG/ACT AERS (ALBUTEROL SULFATE) Inhale 2 puffs ever 4-6 hours as needed for shortness of breath. HYDROCODONE-ACETAMINOPHEN 5-500 MG TABS (HYDROCODONE-ACETAMINOPHEN) Take 1 tab every 6 hours as needed for pain, not to exceed 3 tablets daily.   SOCIAL HISTORY: Unemployed Divorced Current Smoker (1/2pack a day) Alcohol use-quit prior drank 1/2 bottle of whiskey for years Currently living in the [employer].    FAMILY HISTORY: Family History of Alcoholism/Addiction Family History of Anxiety Family History High cholesterol Family History Hypertension Family History of Stroke F 1st degree relative <60   ROS: As per HPI, all other systems reviewed and negative  VITALS: T: 97.6  P: 90  BP: 143/78  R: 19  O2SAT: 19  ON: 97% RA  PHYSICAL EXAM: General:  alert, well-developed, and cooperative to examination.   Head:  normocephalic and atraumatic.   Eyes:  vision grossly intact, pupils equal, pupils round, pupils reactive to light, no injection and anicteric.   Mouth:  pharynx pink and moist, no erythema, and no exudates.   Neck:  supple, full ROM, no thyromegaly, no JVD, and no carotid bruits, Boil on back of neck.   Lungs:  normal respiratory effort, no accessory muscle use, coarse breathing sounds, no crackles, and no wheezes. Heart:  normal rate, regular rhythm, no murmur, no gallop, and no rub.   Abdomen:  soft, non-tender, normal bowel sounds, no distention, no guarding, no rebound tenderness, no hepatomegaly, and no  splenomegaly.   Msk:  no joint swelling, no joint warmth, and no redness over joints.  tenderness on left flank Pulses:  2+ DP/PT pulses bilaterally Extremities:  No cyanosis, clubbing, edema Neurologic:  alert & oriented X3, cranial nerves II-XII intact, strength normal in all extremities, sensation intact to light touch, and  gait normal.   Skin:  turgor normal and no rashes.   Psych:  Oriented X3, memory intact for recent and remote, normally interactive, good eye contact, not anxious appearing, and not depressed appearing.  LABS:   Alcohol                                  398        h      0-10             mg/dL   Ammonia                                  13                11-35            umol/L   Lipase                                   43                11-59            U/L   PTT(a-Partial Thromboplastn Time)        35                24-37            seconds  Protime ( Prothrombin Time)              13.8              11.6-15.2        seconds  INR                                      1.07              0.00-1.49   Amphetamins                              SEE NOTE.         NDT    NONE DETECTED  Barbiturates                             SEE NOTE.         NDT    Oversized comment, see footnote  1  Benzodiazepines                          SEE NOTE.         NDT    NONE DETECTED  Cocaine                                  SEE  NOTE.         NDT    NONE DETECTED  Opiates                                  SEE NOTE.         NDT    NONE DETECTED  Tetrahydrocannabinol                     SEE NOTE.         NDT    NONE DETECTED   Color, Urine                             YELLOW            YELLOW  Appearance                               CLEAR             CLEAR  Specific Gravity                         1.010             1.005-1.030  pH                                       6.5               5.0-8.0  Urine Glucose                            NEGATIVE          NEG              mg/dL  Bilirubin                                NEGATIVE          NEG  Ketones                                  NEGATIVE          NEG              mg/dL  Blood                                    SMALL      a      NEG  Protein                                  100        a      NEG              mg/dL  Urobilinogen                             0.2  0.0-1.0          mg/dL  Nitrite                                  NEGATIVE          NEG  Leukocytes                               NEGATIVE          NEG   WBC / HPF                                0-2               <3               WBC/hpf  Bacteria / HPF                           RARE              RARE  Urine-Other                              SEE NOTE.    AMORPHOUS URATES   CKMB, POC                                1.4               1.0-8.0          ng/mL  Troponin I, POC                          <0.05             0.00-0.09        ng/mL  Myoglobin, POC                           >500       H      12-200           ng/mL    WBC                                      7.4               4.0-10.5         K/uL  RBC                                      3.63       l      4.22-5.81        MIL/uL  Hemoglobin (HGB)                         11.8       l      13.0-17.0        g/dL  Hematocrit (HCT)  35.7       l      39.0-52.0        %  MCV                                      98.5              78.0-100.0       fL  MCHC                                     33.1              30.0-36.0        g/dL  RDW                                      17.8       h      11.5-15.5        %  Platelet Count (PLT)                     252               150-400          K/uL  Neutrophils, %                           59                43-77            %  Lymphocytes, %                           30                12-46            %  Monocytes, %                             8                 3-12             %  Eosinophils, %                           2                 0-5              %  Basophils, %                             1                 0-1              %  Neutrophils, Absolute                    4.4               1.7-7.7  K/uL  Lymphocytes, Absolute                    2.2               0.7-4.0          K/uL  Monocytes, Absolute                      0.6               0.1-1.0           K/uL  Eosinophils, Absolute                    0.2               0.0-0.7          K/uL  Basophils, Absolute                      0.1               0.0-0.1          K/uL   Sodium (NA)                              138               135-145          mEq/L  Potassium (K)                            3.9               3.5-5.1          mEq/L  Chloride                                 102               96-112           mEq/L  CO2                                      21                19-32            mEq/L  Glucose                                  65         l      70-99            mg/dL  BUN                                      25         h      6-23             mg/dL  Creatinine  5.98       h      0.4-1.5          mg/dL  GFR, Est Non African American            10         l      >60              mL/min  GFR, Est African American                12         l      >60              mL/min    Oversized comment, see footnote  1  Bilirubin, Total                         0.8               0.3-1.2          mg/dL  Alkaline Phosphatase                     46                39-117           U/L  SGOT (AST)                               52         h      0-37             U/L  SGPT (ALT)                               32                0-53             U/L  Total  Protein                           7.8               6.0-8.3          g/dL  Albumin-Blood                            3.7               3.5-5.2          g/dL  Calcium                                  8.3        l      8.4-10.5         mg/dL    ASSESSMENT AND PLAN:  Abdominal pain/N/V: This is likely 2/2 to MSK pain 2/2 to fall when intoxicated as patients L lower lateral ribs are tender to palpation (though patient does not remember falling). Also due to severe alcohol consumption this could be 2/2 to gastritis (although this is less likely as the pain is unchanged with food intake). CT abdomen was not done to r/o  diverticulitis,  bowel obstruction, ischemic bowel (however this is less likely as patient had BM today). There are no evidence of pancreatitis as lipase is negative, and UA negative for infection and has no CVA tenderness.  Plan -FOBT x3 -Vicodin as needed for pain  -Will continue PPI and mylanta for GERD/PUD  ESRD: on HD since 08/2009.  on MWF schedule,last session was on friday and was without complications. will contact renal to inform them of patients status. will check renal panel.  Electrolyte abnormalities (hyperkalemia, hypercalcemia, etc.) will likely be corrected with dialysis. Will continue to monitor.   Etoh Abuse: Will place patient on CIWA protocol.  Tobacco abuse: Patient was counseled on smoking cessation strategies including medications and behavior modification options. Patient said she was not ready to stop smoking at this time.  will give nicotine patches while in the hospital.  VTE Proph: SCD     ATTENDING: I performed and/or observed a history and physical examination of the patient.  I discussed the case with the residents as noted and reviewed the residents' notes.  I agree with the findings and plan--please refer to the attending physician note for more details.    Signature________________________________  Printed Name_____________________________

## 2010-09-04 NOTE — Progress Notes (Signed)
Summary: refill/ hla  Phone Note Refill Request Message from:  Fax from Pharmacy on Dec 19, 2009 12:33 PM  Refills Requested: Medication #1:  HYDROCODONE-ACETAMINOPHEN 5-500 MG TABS Take 1 tab every 6 hours as needed for pain   Dosage confirmed as above?Dosage Confirmed   Last Refilled: 3/29 Initial call taken by: Marin Roberts RN,  Dec 19, 2009 12:34 PM  Follow-up for Phone Call        Refill approved-nurse to complete Follow-up by: [name],  Dec 19, 2009 8:07 PM  Additional Follow-up for Phone Call Additional follow up Details #1::        Rx called to pharmacy Additional Follow-up by: Marin Roberts RN,  Dec 21, 2009 6:41 PM    Prescriptions: HYDROCODONE-ACETAMINOPHEN 5-500 MG TABS (HYDROCODONE-ACETAMINOPHEN) Take 1 tab every 6 hours as needed for pain, not to exceed 3 tablets daily.  #75 x 0   Entered and Authorized by:   [name]   Signed by:   [name] on 12/19/2009   Method used:   Telephoned to ...       [facility] (retail)       [address]       [place], [place]  [address]       Ph: [phone]       Fax: [phone]   RxID:   [id number]

## 2010-09-04 NOTE — Miscellaneous (Signed)
Summary: Hospital Admission  INTERNAL MEDICINE ADMISSION HISTORY AND PHYSICAL  *** Please place in progress notes section in chart***  Attending: Dr. Aundria Rud 1st contact: Dr. Arvilla Market [phone] 2nd contact: Dr. Comer Locket [phone]  Holidays, weekends, and after 5pm weekdays 1st contact: [phone] 2nd contact: [phone]  PCP: Dr. Baltazar Apo  CC: rash  HPI: [name] is a 55 yo M with PMH of HTN, ESRD on HD, PSA who presents to the ER from his ALF with complaint of painful, red swelling at the site of a recently removed right clavicular HD catheter.  He has a matured LUE graft and reports removal of his right clavicular HD cath 3 days PTA on Thursday 01/18/10.  He reports purulent drainage from the catheter prior to it's removal and the wound continued to drain small amounts of thick, yellow fluid after removal.  He admits to chills and nausea but denies fever, dizziness, chest pain, sob, vomiting, diarrhea, or other complaint.  ALLERGIES: NKDA  PAST MEDICAL HISTORY: Chronic Pancytopenia Polysubstance abuse    - heroin/opiates, cocaine, benzos EtOH abuse with hx of withdrawl    - last episode 12/2009    - hx of multiple detoxifications ESRD   - on HD, M,W,F (x 9 months)   - hyperparathyroidism Malignant Hypertension HLD Hepatitis C   - diagnosed 20 years ago COPD   MEDICATIONS:  RENA-VITE RX 1 MG TABS (B COMPLEX-C-FOLIC ACID) Take 1 tablet by mouth once a day CALCIUM ACETATE 667 MG CAPS (CALCIUM ACETATE (PHOS BINDER)) Take 1 tablet by mouth three times a day ACETAMINOPHEN 325 MG TABS (ACETAMINOPHEN) Take 1 to 2 tablets by mouth three times a day as needed for pain SPIRIVA HANDIHALER 18 MCG CAPS (TIOTROPIUM BROMIDE MONOHYDRATE) I CAP INHALED ONCE DAILY. VENTOLIN HFA 108 (90 BASE) MCG/ACT AERS (ALBUTEROL SULFATE) Inhale 2 puffs ever 4-6 hours as needed for shortness of breath. HYDROCODONE-ACETAMINOPHEN 5-500 MG TABS (HYDROCODONE-ACETAMINOPHEN) Take 1 tab every 6 hours as needed for pain, not  to exceed 3 tablets daily. ATIVAN 1 MG TABS (LORAZEPAM) take 1-2 tablets by mouth every 6 hours as needed for alcohol withdrawl.   SOCIAL HISTORY: Unemployed, previously worked in [employer] Divorced Current Smoker (1/2pack a day) Alcohol use-quit,  previously drank 1/2 bottle of whiskey for years Currently living in the [employer].    FAMILY HISTORY Family History of Alcoholism/Addiction, anxiety, HLD, HTN Stroke F 1st degree relative <60   ROS: as per HPI, all other systems reviewed and negative   VITALS: T: 98.8  P: 102 BP: 142/86  R: 20  O2SAT: 95% ON: RA  PHYSICAL EXAM: General:  55 yo M, very pleasant, NAD, cooperative, A&Ox3 Head:  normocephalic and atraumatic.   Eyes:  PERRLA, EOMI, vision grossly intact, conjuctive without injection or pallor and sclerae anicteric Mouth:  MMM, no erythema, no exudates, or lesions.   Neck:  supple, full ROM, trachea midline, no palp masses Lungs:  CTAB, normal respiratory effort  Heart: RRR, no M/R/G Abdomen:  soft, NT, ND, BS present and normoactive, no palpable masses Neurologic:  CN II-XII intact,+5 strength globally, sensation grossly intact, gait normal.   Skin:  turgor normal.  Approx 5x5" area of induration and erythema present in along the right mid-clavicle.  No drainage expressed on exam.     Psych: memory intact for recent and remote, normally interactive, good eye contact  LABS:   Sodium (NA)  135               135-145          mEq/L  Potassium (K)                            4.5               3.5-5.1          mEq/L  Chloride                                 102               96-112           mEq/L  CO2                                      23                19-32            mEq/L  Glucose                                  117        h      70-99            mg/dL  BUN                                      29         h      6-23             mg/dL  Creatinine                                5.65       h      0.4-1.5          mg/dL  GFR, Est Non African American            11         l      >60              mL/min  GFR, Est African American                13         l      >60              mL/min    Oversized comment, see footnote  1  Calcium                                  9.1               8.4-10.5         mg/dL   WBC  5.8               4.0-10.5         K/uL  RBC                                      3.97       l      4.22-5.81        MIL/uL  Hemoglobin (HGB)                         13.0              13.0-17.0        g/dL  Hematocrit (HCT)                         38.6       l      39.0-52.0        %  MCV                                      97.3              78.0-100.0       fL  MCHC                                     33.8              30.0-36.0        g/dL  RDW                                      15.8       h      11.5-15.5        %  Platelet Count (PLT)                     132        l      150-400          K/uL  Neutrophils, %                           67                43-77            %  Lymphocytes, %                           22                12-46            %  Monocytes, %                             9                 3-12             %  Eosinophils, %  2                 0-5              %  Basophils, %                             0                 0-1              %  Neutrophils, Absolute                    3.9               1.7-7.7          K/uL  Lymphocytes, Absolute                    1.3               0.7-4.0          K/uL  Monocytes, Absolute                      0.5               0.1-1.0          K/uL  Eosinophils, Absolute                    0.1               0.0-0.7          K/uL  Basophils, Absolute                      0.0               0.0-0.1          K/uL    CXR:  No active cardiopulmonary disease.  Hyperaeration.  ASSESSMENT AND PLAN: (1)Soft tissue infection: Pt with a  possible cellulitis on the right chest at the site of his recently removed HD cath.  He is currently hemodynamically stable, afebrile and without signs of SIRS/sepsis.  Will f/u blood cultures for possible bacteremia. - Will admtit to regular floor - Check blood cxs x 2 - Initiate empiric abx tx with vancomycin to include MRSA coverage - Phenergan for nausea  (2)ESRD on HD - Renal consulted for HD - Will f/u renal recs    (3)PSA - Will check UDS - Obtain SW consult for substance abuse if UDS positive  (4)EtOH abuse - Will place pt on CIWA observation and start withdrawl protocol if indicated  (5)HTN:  - Continue home medications - CMET in am  (6)Tobacco abuse: - Nicotine patch for symptomatic relief - Smoking cessation consult  (7)Pancytopenia Pt has a 6-7 year hx of pancytopenia.  Evaluated with peripheral smear on 01/2009; no evidence of myelofibrosis or other concerning pathology.    (8)GERD - Will continue protonix  (8)VTE PROPH: lovenox   Clinical Lists Changes

## 2010-09-04 NOTE — Discharge Summary (Signed)
Summary: Hospital Discharge Update    Hospital Discharge Update:  Date of Admission: 12/24/2009 Date of Discharge: 12/27/2009  Brief Summary:  Left flank pain of MSK orgin, he was not sent out of ED due to ETOH intoxication.  Admitted and placed in CIWA protocol.  by 5/25 patient was doing well, and was sent back to ALF  Other follow-up issues:  eval for alcohol use?  routine screening.  Medication list changes:  Added new medication of ATIVAN 1 MG TABS (LORAZEPAM) take 1-2 tablets by mouth every 6 hours as needed for alcohol withdrawl. - Signed Rx of ATIVAN 1 MG TABS (LORAZEPAM) take 1-2 tablets by mouth every 6 hours as needed for alcohol withdrawl.;  #30 x 0;  Signed;  Entered by: Darnelle Maffucci MD;  Authorized by: Darnelle Maffucci MD;  Method used: Print then Give to Patient  The medication, problem, and allergy lists have been updated.  Please see the dictated discharge summary for details.  Discharge medications:  RENA-VITE RX 1 MG TABS (B COMPLEX-C-FOLIC ACID) Take 1 tablet by mouth once a day CALCIUM ACETATE 667 MG CAPS (CALCIUM ACETATE (PHOS BINDER)) Take 1 tablet by mouth three times a day ACETAMINOPHEN 325 MG TABS (ACETAMINOPHEN) Take 1 to 2 tablets by mouth three times a day as needed for pain SPIRIVA HANDIHALER 18 MCG CAPS (TIOTROPIUM BROMIDE MONOHYDRATE) I CAP INHALED ONCE DAILY. VENTOLIN HFA 108 (90 BASE) MCG/ACT AERS (ALBUTEROL SULFATE) Inhale 2 puffs ever 4-6 hours as needed for shortness of breath. HYDROCODONE-ACETAMINOPHEN 5-500 MG TABS (HYDROCODONE-ACETAMINOPHEN) Take 1 tab every 6 hours as needed for pain, not to exceed 3 tablets daily. ATIVAN 1 MG TABS (LORAZEPAM) take 1-2 tablets by mouth every 6 hours as needed for alcohol withdrawl.  Other patient instructions:  Please come for an appointment at the outpatient clinic at [facility] on 6/3 at 2:10 pm with Dr. Onalee Hua  for a followup visit.  Please take your medication as prescribed below.  If you  have any problem, Please call the clinic.   In case of an emergency  dial 911 or go to the emergency department.    Note: Hospital Discharge Medications & Other Instructions handout was printed, one copy for patient and a second copy to be placed in hospital chart.

## 2010-09-04 NOTE — Discharge Summary (Signed)
Summary: Hospital Discharge Update    Hospital Discharge Update:  Date of Admission: ./54./54./ Date of Discharge: 05/22/2010  Brief Summary:  Pt is a 55 y/o M with PMH alcohol abuse and ESRD who was re-admitted 2 days after discharge for the same problem - upper GI bleed 2/2 alcohol use and NSAIDs over-use.  On previous discharge, pt refused detox and went home instead - he returned 2 days later with abdominal pain because he started using alcohol again.  Pt treated conservatively as before - didn't need transfusion as H/H stable. Treated with BID PPI.  Will follow-up at [facility] with Dr. Baltazar Apo on Nov 3 at 2pm.  Will need repeat CBC and counseling against further alcohol abuse and NSAID use. Pt was discharged to home intially, and to enrolled in ADS and AA with ativan taper.  Other labs needed at follow-up: CBC  Medication list changes:  Changed medication from LORAZEPAM 1 MG TABS (LORAZEPAM) Take 2 tabs by mouth every 4hrs for 1 day, then 2 tabs every 6hrs for 3 days, then 1 tab every 6hrs for 3 days, then 1 tab every 8 hours for 3 days, then stop to LORAZEPAM 1 MG TABS (LORAZEPAM) Take 2 tab by mouth every 6hrs for 3 days, then 1 tab every 6 hours for 3 days, then 1 tab every 8 hours for 3 days then stop. - Signed Changed medication from OXYCODONE HCL 5 MG TABS (OXYCODONE HCL) Take 1-2 tablets by mouth every 6 hours as needed for pain to OXYCODONE HCL 5 MG TABS (OXYCODONE HCL) Take 2 tablets by mouth every 4 hours as needed for pain - Signed Rx of OXYCODONE HCL 5 MG TABS (OXYCODONE HCL) Take 2 tablets by mouth every 4 hours as needed for pain;  #90 x 0;  Signed;  Entered by: Danelle Berry, MD;  Authorized by: Danelle Berry, MD;  Method used: Handwritten Rx of LORAZEPAM 1 MG TABS (LORAZEPAM) Take 2 tab by mouth every 6hrs for 3 days, then 1 tab every 6 hours for 3 days, then 1 tab every 8 hours for 3 days then stop.;  #45 x 0;  Signed;  Entered by: Danelle Berry, MD;  Authorized by: Danelle Berry,  MD;  Method used: Handwritten  The medication, problem, and allergy lists have been updated.  Please see the dictated discharge summary for details.  Discharge medications:  RENA-VITE RX 1 MG TABS (B COMPLEX-C-FOLIC ACID) Take 1 tablet by mouth once a day CALCIUM ACETATE 667 MG CAPS (CALCIUM ACETATE (PHOS BINDER)) Take 1 tablet by mouth three times a day SPIRIVA HANDIHALER 18 MCG CAPS (TIOTROPIUM BROMIDE MONOHYDRATE) I CAP INHALED ONCE DAILY. VENTOLIN HFA 108 (90 BASE) MCG/ACT AERS (ALBUTEROL SULFATE) Inhale 2 puffs ever 4-6 hours as needed for shortness of breath. LORAZEPAM 1 MG TABS (LORAZEPAM) Take 2 tab by mouth every 6hrs for 3 days, then 1 tab every 6 hours for 3 days, then 1 tab every 8 hours for 3 days then stop. ARANESP (ALBUMIN FREE) 60 MCG/0.3ML SOLN (DARBEPOETIN ALFA-POLYSORBATE) Use weekly during dialysis VENOFER 20 MG/ML SOLN (IRON SUCROSE) 100mg  Infuse over 30-50min at 105-297mL/hr once weekly during dialysis OXYCODONE HCL 5 MG TABS (OXYCODONE HCL) Take 2 tablets by mouth every 4 hours as needed for pain FOLIC ACID 1 MG TABS (FOLIC ACID) Take 1 tablet by mouth once a day THIAMINE HCL 100 MG TABS (THIAMINE HCL) Take 1 tablet by mouth once a day PANTOPRAZOLE SODIUM 40 MG TBEC (PANTOPRAZOLE SODIUM) Take 1 tablet by mouth two  times a day  Other patient instructions:  Please follow-up at the outpatient clinic (phone [phone]) on Nov 3 at 2:00pm with Dr. Baltazar Apo. Please take your medicines as directed. Please avoid all NSAIDs (ibuprofen, motrin, Goodys powders, BC powders, or aspirin) and alcohol. You may resume your regular activities and diet.  Note: Hospital Discharge Medications & Other Instructions handout was printed, one copy for patient and a second copy to be placed in hospital chart.

## 2010-09-04 NOTE — Progress Notes (Signed)
Summary: refill/gg  Phone Note Refill Request  on October 25, 2009 3:11 PM  Pt request Rx for vicodin 5/500mg  q 6 hr as needed for pain.  Received a note from [facility] saying this med was ok'd on drug review. I don't see on our EMR list.   Method Requested: Fax to Local Pharmacy Initial call taken by: Merrie Roof RN,  October 25, 2009 3:12 PM  Follow-up for Phone Call        [facility] and I did say it was ok. I will add it to the med list.  Follow-up by: [name],  October 27, 2009 7:15 AM  Additional Follow-up for Phone Call Additional follow up Details #1::        Rx called to pharmacy Additional Follow-up by: Merrie Roof RN,  October 27, 2009 2:19 PM    New/Updated Medications: HYDROCODONE-ACETAMINOPHEN 5-500 MG TABS (HYDROCODONE-ACETAMINOPHEN) Take 1 tab every 6 hours as needed for pain, not to exceed 3 tablets daily. Prescriptions: HYDROCODONE-ACETAMINOPHEN 5-500 MG TABS (HYDROCODONE-ACETAMINOPHEN) Take 1 tab every 6 hours as needed for pain, not to exceed 3 tablets daily.  #75 x 0   Entered and Authorized by:   [name]   Signed by:   [name] on 10/27/2009   Method used:   Telephoned to ...       [facility] (retail)       [address]       [place], [place]  [address]       Ph: [phone]       Fax: [phone]   RxID:   [id number]

## 2010-09-04 NOTE — Initial Assessments (Signed)
INTERNAL MEDICINE ADMISSION HISTORY AND PHYSICAL  First Contact: Dr. Dorthula Rue [phone] Second Contact: Dr. Scot Dock [phone] Weekends, [place], or after 5pm weekdays: First Contact: [phone] Second Contact:[phone]  PCP: Dr. Baltazar Apo  LK:[id number],ZDGUYQI, jittery  x1 day  HPI: 55 y/o man with PMH significant for HTN, ESRD on HD, alcohol abuse , multiple admissions for alcohol detox came in to ER with chief complaint of headaches and shaking for one day, when he decided to undergo alcohol detox and didnot drink for 2 days PTA. 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 He also reports some nausea and vomiting. He had six episodes of vomiting which basically contained what he ate, nonbilious, non bloody.  He also complained of some abdominal pain which he describes as crampy, diffuse all over his abdomen.  He also reports some low grade fever but never recorded his temp. He also endorses some diarrhea, 3-4 /day, watery, nonbloody, nonmucoid.  Otherwise denies chest pain, palpitations, SOB, diarrhea, weakness or numbness.   ALLERGIES: NKDA  PAST MEDICAL HISTORY: Chronic Pancytopenia Polysubstance abuse including alcohol, heroin, opiates, cocaine, benzos   -H/O withdrawal, last episode May 2011.    -H/O multiple detoxifications Chronic Kidney Disease, stage V Malignant Hypertension Hepatitis C COPD MRSA at the site of a previously removed right clavicular hemodialysis  catheter in 06/11- treated wiyh 6 days of vanc and 10 days of doxy.  MEDICATIONS: RENA-VITE RX 1 MG TABS (B COMPLEX-C-FOLIC ACID) Take 1 tablet by mouth once a day CALCIUM ACETATE 667 MG CAPS (CALCIUM ACETATE (PHOS BINDER)) Take 1 tablet by mouth three times a day ACETAMINOPHEN 325 MG TABS  (ACETAMINOPHEN) Take 1 to 2 tablets by mouth three times a day as needed for pain SPIRIVA HANDIHALER 18 MCG CAPS (TIOTROPIUM BROMIDE MONOHYDRATE) I CAP INHALED ONCE DAILY. VENTOLIN HFA 108 (90 BASE) MCG/ACT AERS (ALBUTEROL SULFATE) Inhale 2 puffs ever 4-6 hours as needed for shortness of breath. HYDROCODONE-ACETAMINOPHEN 5-500 MG TABS (HYDROCODONE-ACETAMINOPHEN) Take 1 tab every 6 hours as needed for pain, not to exceed 3 tablets daily. ATIVAN 1 MG TABS (LORAZEPAM) take 1-2 tablets by mouth every 6 hours as needed for alcohol withdrawl.   SOCIAL HISTORY: Social History: Unemployed Divorced Current Smoker 1PPDx30 years Alcohol use-drinks about a quarter of vodka everyday, last drink was 2 days ago. Currently living in the [facility].    FAMILY HISTORY Family History: Family History of Alcoholism/Addiction in mother , father and brother. Mother and father died at age of 56 from fall and injuring their neck. Positive family History of Anxiety, high cholestrol, HTN and stroke.  ROS:As per HPI  VITALS: T:98.1  P: 94 BP:152/85  R:20  O2SAT:95%  ON:RA  PHYSICAL EXAM: Gen: Patient was jittery and shaky on examination. Eyes: PERRL, EOMI, No signs of anemia or jaundince. ENT: MMM, OP clear, No erythema, thrush or exudates. Neck: Supple, No carotid Bruits, No JVD, No thyromegaly Resp: CTA- Bilaterally, No W/C/R. CVS: S1S2 RRR, No M/R/G GI: Abdomen is soft, tender topalpation in all the four quadrants, ND, NG, NR, BS+. No organomegaly. Ext: No pedal edema, cyanosis or clubbing. GU: No CVA tenderness. Skin: No visible rashes, scars. Lymph: No palpable lymphadenopathy. MS: Moving all 4 extremities. Neuro: A&O X3, CN II - XII are grossly intact.  Motor strength is 5/5 in the all 4 extremities, Sensations intact to light touch, Gait normal, Cerebellar signs negative. Psych: Appropriate  LABS:   WBC                                      4.5               4.0-10.5          K/uL  RBC                                      3.58       l      4.22-5.81        MIL/uL  Hemoglobin (HGB)                         10.9       l      13.0-17.0        g/dL  Hematocrit (HCT)                         32.2       l      39.0-52.0        %  MCV                                      89.9              78.0-100.0       fL  MCH -                                    30.4              26.0-34.0        pg  MCHC                                     33.9              30.0-36.0        g/dL  RDW                                      15.8       h      11.5-15.5        %  Platelet Count (PLT)                     154               150-400          K/uL  Neutrophils, %                           28         l      43-77            %  Lymphocytes, %                           55         h      12-46            %  Monocytes, %                             14         h      3-12             %  Eosinophils, %                           2                 0-5              %  Basophils, %                             1                 0-1              %  Neutrophils, Absolute                    1.3        l      1.7-7.7          K/uL  Lymphocytes, Absolute                    2.5               0.7-4.0          K/uL  Monocytes, Absolute                      0.6               0.1-1.0          K/uL  Eosinophils, Absolute                    0.1               0.0-0.7          K/uL  Basophils, Absolute                      0.0               0.0-0.1          K/uL  Sodium (NA)                              132        l      135-145          mEq/L  Potassium (K)                            4.0               3.5-5.1          mEq/L  Chloride  102               96-112           mEq/L  CO2                                      18         l      19-32            mEq/L  Glucose                                  74                70-99            mg/dL  BUN                                      46          h      6-23             mg/dL  Creatinine                               6.37       h      0.4-1.5          mg/dL  GFR, Est Non African American            9          l      >60              mL/min  GFR, Est African American                11         l      >60              mL/min    Oversized comment, see footnote  1  Bilirubin, Total                         0.6               0.3-1.2          mg/dL  Alkaline Phosphatase                     56                39-117           U/L  SGOT (AST)                               68         h      0-37             U/L  SGPT (ALT)                               45  0-53             U/L  Total  Protein                           8.0               6.0-8.3          g/dL  Albumin-Blood                            3.8               3.5-5.2          g/dL  Calcium                                  8.2        l      8.4-10.5         mg/dL Amphetamins                              SEE NOTE.         NDT    NONE DETECTED  Barbiturates                             SEE NOTE.         NDT    Oversized comment, see footnote  1  Benzodiazepines                          POSITIVE   a      NDT  Cocaine                                  SEE NOTE.         NDT    NONE DETECTED  Opiates                                  SEE NOTE.         NDT    NONE DETECTED  Tetrahydrocannabinol                     SEE NOTE.         NDT    NONE DETECTED Tricyclics                               SEE NOTE.         NDT    NONE DETECTED    LOWEST DETECTABLE LIMITS    FOR URINE DRUG SCREEN    Drug Class       Cutoff (ng/mL)    Tricyclics       300 Creatinine, Urinary                      55.3                               mg/dL Sodium, Urine  22                                 mEq/L   Alcohol                                  340        h      0-10             mg/dL  ASSESSMENT AND PLAN: (1)Alcohol Withdrawl- Admitted him to SDU CIWA protocol. He is  getting Ativan. Would observe him for DT's For his syptoms of pain and nausea, we gave him some morphine and zofran. Will start him on protonix.   Will go to behavioral health once medically stable for alcohol detox. FA and thiamine   (2)Polysubstance Abuse-he denies doing any illicit drugs at this time. Smoking cessation councelling. Nicotine patch. Social worker consult in the Am.   (3HTN: Continue home medications  . (4)ESRD- HD on MWF.  continue him on his HD.  (5Chonic Pancytopenia. No active issues  (6DEPRESSION/ANXIETY:Denies SI/HI Continue home medications  (7)VTE PROPH: lovenox

## 2010-09-04 NOTE — Miscellaneous (Signed)
Summary: hospital admission  INTERNAL MEDICINE ADMISSION HISTORY AND PHYSICAL  First Contact: Dr. Claudette Laws [phone] Second Contact: Dr. Arvilla Market [phone]  Weekends, Holidays, or after 5pm weekdays: First Contact: [phone] Second Contact:[phone]  PCP: Dr. Baltazar Apo  NF:[id number] pain  HPI: 55 y/o man with PMH significant for HTN, ESRD on HD, alcohol abuse , multiple admissions for alcohol detox came in to ER with chief complaint of Abdomianl pain since 5 am 05/09/10. He was allright before 5 am when he had a drink of Vodka when he was getting ready to go for his HD and started having Epigastric and RUQ abdominal pain- 10/10, sharp, stabbing knife like, constant- aggravated by movements and not alleviated by anything. The pain radiates to the back.  Also has associated N/V, vomitted 4 times after 5 am- no blood noticed c/o SOB along with the abd pain. Has noticed black stools since last 3 days- never before. Also c/o HA- diffuse, since last 6 months.  Goes to HD MWF- was not able to get one today. Denies any fever,cough,chest pain,diarrhea, urinary abn, anorexia, wt loss.      ALLERGIES: NKDA  PAST MEDICAL HISTORY: Chronic Pancytopenia Polysubstance abuse including alcohol, heroin, opiates, cocaine, benzos   -H/O withdrawal, last episode 04/06/2010.    -H/O multiple detoxifications Chronic Kidney Disease, stage V Malignant Hypertension Hepatitis C COPD MRSA at the site of a previously removed right clavicular hemodialysis  catheter in 06/11- treated wiyh 6 days of vanc and 10 days of doxy.  MEDICATIONS: RENA-VITE RX 1 MG TABS (B COMPLEX-C-FOLIC ACID) Take 1 tablet by mouth once a day CALCIUM ACETATE 667 MG CAPS (CALCIUM ACETATE (PHOS BINDER)) Take 1 tablet by mouth three times a day ACETAMINOPHEN 325 MG TABS (ACETAMINOPHEN) Take 1 to 2 tablets by mouth three times a day as needed for pain SPIRIVA HANDIHALER 18 MCG CAPS (TIOTROPIUM BROMIDE MONOHYDRATE) I CAP INHALED ONCE  DAILY. VENTOLIN HFA 108 (90 BASE) MCG/ACT AERS (ALBUTEROL SULFATE) Inhale 2 puffs ever 4-6 hours as needed for shortness of breath. HYDROCODONE-ACETAMINOPHEN 5-500 MG TABS (HYDROCODONE-ACETAMINOPHEN) Take 1 tab every 6 hours as needed for pain, not to exceed 3 tablets daily. ATIVAN 1 MG TABS (LORAZEPAM) take 1-2 tablets by mouth every 6 hours as needed for alcohol withdrawl.   SOCIAL HISTORY: Social History: Unemployed Divorced Current Smoker 1PPDx30 years Alcohol use-drinks about a quarter of vodka every night , last drink was morning of 05/09/10. Currently living in the [facility].    FAMILY HISTORY Family History: Family History of Alcoholism/Addiction in mother , father and brother. Mother and father died at age of 63 from fall and injuring their neck. Positive family History of Anxiety, high cholestrol, HTN and stroke.  ROS:As per HPI  VITALS: T:98.9  P: 123 to 92 BP:160/104- 114/69  R:22  O2SAT:98%  ON:RA  PHYSICAL EXAM: Gen: Patient is in Acute distress due to his Abdominal pain, looks dry Eyes: PERRL, EOMI, No signs of anemia or jaundince. ENT:Dry mucous membranes, OP clear, No erythema, thrush or exudates. Neck: Supple, No carotid Bruits, No JVD, No thyromegaly Resp: CTA- Bilaterally, No W/C/R. CVS: S1S2 RRR, No M/R/G GI: Abdomen is soft, tender to palpation in epigastric and RUQ, ND, NG, NR, BS+. No organomegaly. Also tenderness to palpation to R lumbar paraspinal area PR - black tarry stools on finger tips- hemeocuult +ve Ext: No pedal edema, cyanosis or clubbing. GU: No CVA tenderness. Skin: No visible rashes, scars. Lymph: No palpable lymphadenopathy. MS: Moving all 4 extremities. Neuro: A&O X3,  CN II - XII are grossly intact. Motor strength is 5/5 in the all 4 extremities, Sensations intact to light touch, Gait normal, Cerebellar signs negative. Psych: Appropriate  LABS:    WBC                                      6.5               4.0-10.5          K/uL  RBC                                      2.82       l      4.22-5.81        MIL/uL  Hemoglobin (HGB)                         9.0        l      13.0-17.0        g/dL  Hematocrit (HCT)                         26.6       l      39.0-52.0        %  MCV                                      94.3              78.0-100.0       fL  MCH -                                    31.9              26.0-34.0        pg  MCHC                                     33.8              30.0-36.0        g/dL  RDW                                      16.2       h      11.5-15.5        %  Platelet Count (PLT)                     141        l      150-400          K/uL  Neutrophils, %                           55                43-77            %  Lymphocytes, %                           37                12-46            %  Monocytes, %                             8                 3-12             %  Eosinophils, %                           1                 0-5              %  Basophils, %                             1                 0-1              %  Neutrophils, Absolute                    3.5               1.7-7.7          K/uL  Lymphocytes, Absolute                    2.4               0.7-4.0          K/uL  Monocytes, Absolute                      0.5               0.1-1.0          K/uL  Eosinophils, Absolute                    0.0               0.0-0.7          K/uL  Basophils, Absolute                      0.1               0.0-0.1          K/uL  Sodium (NA)                              135               135-145          mEq/L  Potassium (K)                            3.7               3.5-5.1  mEq/L  Chloride                                 100               96-112           mEq/L  CO2                                      23                19-32            mEq/L  Glucose                                  94                70-99            mg/dL  BUN                                      51          h      6-23             mg/dL  Creatinine                               5.17       h      0.4-1.5          mg/dL  GFR, Est Non African American            12         l      >60              mL/min  GFR, Est African American                14         l      >60              mL/min    Oversized comment, see footnote  1  Bilirubin, Total                         0.7               0.3-1.2          mg/dL  Alkaline Phosphatase                     45                39-117           U/L  SGOT (AST)                               131        h      0-37             U/L  SGPT (ALT)  56         h      0-53             U/L  Total  Protein                           8.2               6.0-8.3          g/dL  Albumin-Blood                            3.9               3.5-5.2          g/dL  Calcium                                  11.2       h      8.4-10.5         mg/dL  CKMB, POC                                1.8               1.0-8.0          ng/mL  Troponin I, POC                          <0.05             0.00-0.09        ng/mL  Myoglobin, POC                           427        H      12-200           ng/mL   Lipase                                   80         h      11-59            U/L     CKMB, POC                                1.1               1.0-8.0          ng/mL  Troponin I, POC                          <0.05             0.00-0.09        ng/mL  Myoglobin, POC                           266        H      12-200           ng/mL  CKMB, POC                                1.2               1.0-8.0          ng/mL  Troponin I, POC                          <0.05             0.00-0.09        ng/mL  Myoglobin, POC                           392        H      12-200           ng/mL    CXR:   Hyperexpansion and small right apical pneumothorax.   ASSESSMENT AND PLAN 1. Epigastric Pain/Nausea/Vomiting: Most likely secondary to Acute Gastritis secondary to  NSAID's uage and alcohol abuse Other differentials include Acute Pancreatitis vs PUD  Plans: Admit to regular bed NPO IV PPI therapy, Phenergan IV Fluids Pain control with IV Morphine CBC Q8H Check Acute abdominal series to R/O SBO Check Lactate to R/O Mesenteric ischemia/infarction Consider advancing diet as tolerates Given Hemoccult positive stools, drop in Hgb, Severe epigastric pain, will consider GI consult in AM for a possible EGD  Check H.pylori antibody  2. GI Bleed: Given black stools, upper GI symptoms, positive FOBT, in the setting of NSAID's usage, suspect Upper GI etiology Consider GI consult in AM CBC q8H Type and cross 2 units of PRBC  3. Anemia: Acute on Chronic See #2    4. ESRD: No acute needs for HD currently On HD on M/W/F Will consider renal consult in AM  5. PSA: CSW consult CIWA Protocol for observation Thiamine and folate supplementation  6. HTN: Currently well controlled. Monitor  7. Elevated LFT's: Pattern consistent with Alcohol abuse in the setting of Hep C history Monitor for now  8. Mild Thrombocytopenia: Plt stable Monitor   9. DVT PPX: SCD's given Heme positive stool

## 2010-09-04 NOTE — Miscellaneous (Signed)
Summary: [facility].:Drug Review  [facility]Drug Review   Imported By: Florinda Marker 10/18/2009 15:30:17  _____________________________________________________________________  External Attachment:    Type:   Image     Comment:   External Document

## 2010-09-04 NOTE — Discharge Summary (Signed)
Summary: Hospital Discharge Update    Hospital Discharge Update:  Date of Admission: 05/09/2010 Date of Discharge: 05/13/2010  Brief Summary:  Pt is a 55 y/o M with PMH alcohol abuse and ESRD who was admitted for upper GI bleed 2/2 alcohol use and NSAIDs over-use who underwent EGD that showed multiple ulcers.  Treated with BID PPI.  Should follow-up at [facility] in next 2 weeks ([place] to phone patient with appointment). Will need repeat CBC and counseling against further alcohol abuse and NSAID use. Pt was discharged home with ativan taper - he did not desire detox at this time. Was given brochures related to local AA resources.  Labs needed at follow-up: CBC with differential  Problem list changes:  Removed problem of MUSCLE SPASM (ICD-728.85) Added new problem of Hospitalized for  PEPTIC ULCER DISEASE (ICD-533.90)  Medication list changes:  Added new medication of ARANESP (ALBUMIN FREE) 60 MCG/0.3ML SOLN (DARBEPOETIN ALFA-POLYSORBATE) Use weekly during dialysis Added new medication of VENOFER 20 MG/ML SOLN (IRON SUCROSE) 100mg  Infuse over 30-41min at 105-269mL/hr once weekly during dialysis Removed medication of ACETAMINOPHEN 325 MG TABS (ACETAMINOPHEN) Take 1 to 2 tablets by mouth three times a day as needed for pain Removed medication of HYDROCODONE-ACETAMINOPHEN 5-500 MG TABS (HYDROCODONE-ACETAMINOPHEN) Take 1 tab every 6 hours as needed for pain, not to exceed 3 tablets daily. Added new medication of OXYCODONE HCL 5 MG TABS (OXYCODONE HCL) Take 1-2 tablets by mouth every 6 hours as needed for pain - Signed Changed medication from ATIVAN 1 MG TABS (LORAZEPAM) take 1-2 tablets by mouth every 6 hours as needed for alcohol withdrawl. to LORAZEPAM 1 MG TABS (LORAZEPAM) Take 2 tabs by mouth every 4hrs for 1 day, then 2 tabs every 6hrs for 3 days, then 1 tab every 6hrs for 3 days, then 1 tab every 8 hours for 3 days, then stop - Signed Added new medication of FOLIC ACID 1 MG TABS (FOLIC ACID) Take  1 tablet by mouth once a day - Signed Added new medication of THIAMINE HCL 100 MG TABS (THIAMINE HCL) Take 1 tablet by mouth once a day - Signed Added new medication of PANTOPRAZOLE SODIUM 40 MG TBEC (PANTOPRAZOLE SODIUM) Take 1 tablet by mouth two times a day - Signed Rx of OXYCODONE HCL 5 MG TABS (OXYCODONE HCL) Take 1-2 tablets by mouth every 6 hours as needed for pain;  #30 x 0;  Signed;  Entered by: Danelle Berry, MD;  Authorized by: Danelle Berry, MD;  Method used: Handwritten Rx of LORAZEPAM 1 MG TABS (LORAZEPAM) Take 2 tabs by mouth every 4hrs for 1 day, then 2 tabs every 6hrs for 3 days, then 1 tab every 6hrs for 3 days, then 1 tab every 8 hours for 3 days, then stop;  #57 x 0;  Signed;  Entered by: Danelle Berry, MD;  Authorized by: Danelle Berry, MD;  Method used: Handwritten Rx of FOLIC ACID 1 MG TABS (FOLIC ACID) Take 1 tablet by mouth once a day;  #30 x 11;  Signed;  Entered by: Danelle Berry, MD;  Authorized by: Danelle Berry, MD;  Method used: Print then Give to Patient Rx of THIAMINE HCL 100 MG TABS (THIAMINE HCL) Take 1 tablet by mouth once a day;  #30 x 11;  Signed;  Entered by: Danelle Berry, MD;  Authorized by: Danelle Berry, MD;  Method used: Print then Give to Patient Rx of PANTOPRAZOLE SODIUM 40 MG TBEC (PANTOPRAZOLE SODIUM) Take 1 tablet by mouth two times a day;  #  60 x 5;  Signed;  Entered by: Danelle Berry, MD;  Authorized by: Danelle Berry, MD;  Method used: Print then Give to Patient  The medication, problem, and allergy lists have been updated.  Please see the dictated discharge summary for details.  Discharge medications:  RENA-VITE RX 1 MG TABS (B COMPLEX-C-FOLIC ACID) Take 1 tablet by mouth once a day CALCIUM ACETATE 667 MG CAPS (CALCIUM ACETATE (PHOS BINDER)) Take 1 tablet by mouth three times a day SPIRIVA HANDIHALER 18 MCG CAPS (TIOTROPIUM BROMIDE MONOHYDRATE) I CAP INHALED ONCE DAILY. VENTOLIN HFA 108 (90 BASE) MCG/ACT AERS (ALBUTEROL SULFATE) Inhale 2  puffs ever 4-6 hours as needed for shortness of breath. LORAZEPAM 1 MG TABS (LORAZEPAM) Take 2 tabs by mouth every 4hrs for 1 day, then 2 tabs every 6hrs for 3 days, then 1 tab every 6hrs for 3 days, then 1 tab every 8 hours for 3 days, then stop ARANESP (ALBUMIN FREE) 60 MCG/0.3ML SOLN (DARBEPOETIN ALFA-POLYSORBATE) Use weekly during dialysis VENOFER 20 MG/ML SOLN (IRON SUCROSE) 100mg  Infuse over 30-58min at 105-250mL/hr once weekly during dialysis OXYCODONE HCL 5 MG TABS (OXYCODONE HCL) Take 1-2 tablets by mouth every 6 hours as needed for pain FOLIC ACID 1 MG TABS (FOLIC ACID) Take 1 tablet by mouth once a day THIAMINE HCL 100 MG TABS (THIAMINE HCL) Take 1 tablet by mouth once a day PANTOPRAZOLE SODIUM 40 MG TBEC (PANTOPRAZOLE SODIUM) Take 1 tablet by mouth two times a day  Other patient instructions:  Please take your medicines as directed. Your new medicines are folic acid and thiamine (two vitamins), aranesp and venofer (medicines to get when you are at dialysis), protonix (for your stomach ulcers), oxycodone (for pain), and lorazepam (for your withdrawal symptoms).  Please take these medicines as directed. Please avoid alcohol and NSAID medicines (ibuprofen, motrin, aleve, Goody's powders, BC powders, and aspirin) like we talked about as they can worsen your peptic ulcer disease. You will need to follow-up in the outpatient clinic [phone] phone) in the next 2-3 weeks. You will be phoned tomorrow with a follow-up appointment. If you do not hear from the outpatient clinic by tomorrow (Monday) afternoon, please call for an appointment.  You may resume your regular M, W, F dialysis schedule. Please let them know of the recent change in your medications.  Note: Hospital Discharge Medications & Other Instructions handout was printed, one copy for patient and a second copy to be placed in hospital chart.

## 2010-09-04 NOTE — Progress Notes (Signed)
Summary: phone/gg  Phone Note Call from Patient   Caller: Patient Summary of Call: Pt called to let you know the meds you gave him are not working.  He was d/c from hospital on 10/9.  Found to have ulcers that were bleeding. He is on protonix and Lorazepam, he is taking both. He is still having severe pain, and stool is red color. ( was dark/black during hospitalization)   He has nausea and vomiting 2 - 3 times a day. Pt is eating and drinking okay. Pt # [id number] Initial call taken by: Merrie Roof RN,  May 16, 2010 12:01 PM  Follow-up for Phone Call        Phoned patient - per patient he has been taking all medicines as prescribed but contineus to have black/red stools and nausea/vomiting.  Says his "nerves are shot" and that he feels very sick.    I recommend that he be seen in clinic today and there is availability with Dr. Threasa Beards at 2pm. Pt to get CBC and BMet prior to visit with Dr. Threasa Beards.  Orders have been placed. Follow-up by: Danelle Berry, MD,  May 16, 2010 12:27 PM  Additional Follow-up for Phone Call Additional follow up Details #1::        Pt informed and will come in early for stat labs. Additional Follow-up by: Merrie Roof RN,  May 16, 2010 12:37 PM     Process Orders Check Orders Results:     [facility]: Check successful Order queued for requisitioning for [facility]: May 16, 2010 12:26 PM  Tests Sent for requisitioning (May 16, 2010 1:32 PM):     05/16/2010: [facility] -- T-Basic Metabolic Panel [id number] (signed)     05/16/2010: [facility] -- T-CBC No Diff [[id number]] (signed)

## 2010-09-04 NOTE — Miscellaneous (Signed)
Summary: Hospital Admission 06/12/10  INTERNAL MEDICINE ADMISSION HISTORY AND PHYSICAL PCP: Dr Melida Quitter   CC: Epigastric pain since 10days HPI: 55 y/o man with PMH significant for alcohol abuse,  ESRD on HD who comes to the clinic for abdominal pain  for 10 days.   The pain is located in the epigastrium, described as sharp,constant, stabbing , getting progressively worse, worst being 9/10. No relieving or aggravating factors. Pain is associated with multiple episodes of nausea, vomiting since yesterday. The last time he threw up was this morning. He describes his vomitus as non bilious, nonprojectile with specks of blood in 2 episodes,. He has been unable to keep solid foods down but able to keep liquids down. He has also had multiple episodes of loose stool since one week, Non foul smelling, non bloody, watery in consistancy.  He also complains of chest discomfort since the pain began, behind the sternum, not radiating, not assoc with SOB/ cough. No c/o weight loss/ swelling in limbs/ difficulty urination.    He had an EGD (05/10/10) by Dr. [name] ,  that showed linear ulcer in the esophagus, prepyloric ulcer, a small antral ulcer with antral gastritis. He has not taken protonix for last two weeks due to financial concerns. He was previously hospitalised with similar symptoms and discharged on 05/22/2010.     ALLERGIES: NKDA  PAST MEDICAL HISTORY :  Chronic Kidney Disease, stage V- On Hemodialysis 3 times a week. Compliant with his schedule. Last dialysis on 06/11/2010. His nephrologist is Dr Briant Cedar from [facility]. Anemia of chronic Disease- Baseline hemoglobin between 10 and 11.  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 VENTOLIN HFA 108 (90 BASE) MCG/ACT AERS (ALBUTEROL SULFATE) Inhale 2 puffs ever 4-6 hours as needed for shortness of breath. ARANESP (ALBUMIN FREE) 60 MCG/0.3ML SOLN (DARBEPOETIN ALFA-POLYSORBATE) Use weekly during dialysis VENOFER 20 MG/ML SOLN (IRON SUCROSE) 100mg  Infuse over 30-53min at 105-245mL/hr once weekly during dialysis OXYCODONE HCL 5 MG TABS (OXYCODONE HCL) Take 2 tablets by mouth every 4 hours as needed for pain FOLIC ACID 1 MG TABS (FOLIC ACID) Take 1 tablet by mouth once a day THIAMINE HCL 100 MG TABS (THIAMINE HCL) Take 1 tablet by mouth once a day PANTOPRAZOLE SODIUM 40 MG TBEC (PANTOPRAZOLE SODIUM) Take 1 tablet by mouth two times a day   SOCIAL HISTORY: Social History: Unemployed Divorced Current Smoker (1pack a day) for 32yrs Alcohol use-quit prior drank 1/2 bottle of whiskey for years. Says he stopped drinking 40days ago.  Currently living in the [facility].         Substance use: Heroin use in the past. Denies iv drug use for now.  Insurance:  FAMILY HISTORY Family History: Family History of Alcoholism/Addiction Family History of Anxiety Family History High cholesterol Family History Hypertension Family History of Stroke F 1st degree relative <60   ROS: General: Denies fever, chills, weight loss/gain, fatigue, recent travel, sick contacts, diaphoresis, night sweats  HEENT: Denies earache, nosebleed, sore throat CV: Denies chest pain, palpitations, dyspnea, orthopnea, PND, edema, syncope Resp: Denies dyspnea, DOE, cough, sputum, hemoptysis, wheezing GU: Denies dysuria, urinary frequency, flank pain, hematuria, urinary  incontinence, nocturia, urethral discharge, vaginal discharge Derm: Denies rash, hair loss Endocrine: Denies heat intolerance, cold intolerance, polyuria, polydipsia Heme/Lymph: Denies bruising, lymphadenopathy MS: Denies joint  pain, joint swelling, recent trauma, muscle pain, back pain  Neuro/Eyes: Denies weakness, numbness, tremor, headache, diplopia, blurry vision, vision change, hearing loss, dizziness, tinnitus, imbalance, vertigo, neck stiffness Psych: Denies depression, anxiety, suicidal ideations, homicidal ideations, hallucinations   VITALS: T: 97.5  P: 91bpm  BP: 146/91   R:  O2SAT:  ON:  PHYSICAL EXAM: General:  alert, well-developed, and cooperative to examination.   Head:  normocephalic and atraumatic.   Eyes:  vision grossly intact, pupils equal, pupils round, pupils reactive to light, no injection and anicteric.   Mouth:  pharynx pink and moist, no erythema, and no exudates.   Neck:  supple, full ROM, no thyromegaly, no JVD, and no carotid bruits.   Lungs:  normal respiratory effort, no accessory muscle use, normal breath sounds, no crackles, and no wheezes.  Heart:  normal rate, regular rhythm, no murmur, no gallop, and no rub. Normal breath sounds heard.  Abdomen:  Voluntary guarding, abdomen soft when patient distracted, tender to palpation in the RUQ and epigastrium,  and normal bowel sounds. No free fluid, no organomegaly. Msk:  no joint swelling, no joint warmth, and no redness over joints.   Pulses:  2+ DP/PT pulses bilaterally Extremities:  No cyanosis, clubbing, edema  Neurologic:  alert & oriented X3, cranial nerves II-XII intact, strength normal in all extremities, sensation intact to light touch, and gait normal.   Skin:  turgor normal and no rashes.   Psych:  Oriented X3, memory intact for recent and remote, normally interactive, good eye contact, not anxious appearing, and not depressed appearing.        LABS: Labs Pending   ASSESSMENT AND PLAN:  1) Abdominal pain, vomitng with specks of blood and loose stool-   He reports sharp/stabbing pain in his RUQ and epigatrium. Differentials include gastritis vs PUD vs esophageal or gastric perforation vs gall stones vs pancreatitis .  Given his presentation this looks like alcohol induced gastritis, perforation is less likely. Plan for an upright CXR to rule out perforation. EKG ordered to rule out ACS.  Keep him NPO and IVF started at 125cc/hr Start him on Protonix IV 40mg   two times a day.  Will check his CBC and CMET. Monitor Hgb to rule out active bleeding ( baseline being between 10 and 11)  Gastroenterology was consulted and they agree with the current plan. Will plan to do an EGD on him tomorrow in view of previous EGD which showed   Linear ulcer in the mid esophagus.   Prepyloric ulcer.   Small antral ulcer with antral gastritis.   Duodenitis.    They recommend to continue to monitor him in case he develops any symptoms of acute bleed.     2)  SUBSTANCE ABUSE, MULTIPLE  He was reffered to the AA group upon d/c. He says that he has not been drinking for weeks now. Will continue him on folic caid and thiamine and put him on a CIWA protocol. Counselling regarding alcohol and smoking cessation.   3) ESRD- patient is currently on hemodialysis 3 times a week on Mondays, Wednesdays and Fridays. His last Hemodialysis was on 06/11/2010.  He has been compliant with his sessions. Nephrology was consulted and they will follow up with him on 06/13/2010.   4) AOCD : Monitor CBC. Will consider tranfusion if he develops active bleed.   (  5) VTE PROPH: SCD's.

## 2010-09-04 NOTE — Assessment & Plan Note (Signed)
Summary: ACUTE/SIDHU/HFU R/S FROM 06-07-10/[facility]   Vital Signs:  Patient profile:   55 year old male Height:      70 inches (177.80 cm) Weight:      150.6 pounds (68.45 kg) BMI:     21.69 Temp:     97.5 degrees F (36.39 degrees C) oral Pulse rate:   91 / minute BP sitting:   146 / 91  (right arm)  Vitals Entered By: [name] RN (June 12, 2010 2:37 PM) Is Patient Diabetic? No Pain Assessment Patient in pain? yes     Location: stomach and right low chest Intensity: 8-9 Type: sharp Onset of pain  past month Nutritional Status BMI of 19 -24 = normal Nutritional Status Detail appetite fair  Have you ever been in a relationship where you felt threatened, hurt or afraid?denies   Does patient need assistance? Functional Status Self care Ambulation Normal Comments HFU - sl better - still has pain. 5:10PM taken to Room 5156 via w/c.   Primary Care Provider:  [name]   History of Present Illness: 55 y/o man with PMH significant for alcohol abuse,  ESRD on HD who comes to the clinic for abdominal pain  for 2 weeks.  The pain started about 2 weeks ago, located in the RLQ and epigastrium, described as sharp, stabbing , getting progressively worse, worst being 9/10. This is also acompanied with nausea, vomiting. The last time he threw up was this morning. He describes his vomitus as non bloody, nonprojectile.  He had an EGD(05/10/10) by Dr. Evette [name] ,  that showed linear ulcer in the esophagus, prepyloric ulcer, a small antral ulcer with antral gastritis .  He is not taking his meds for past 2 weeks as he is having difficulty paying.  Depression History:      The patient denies a depressed mood most of the day and a diminished interest in his usual daily activities.         Preventive Screening-Counseling & Management  Alcohol-Tobacco     Alcohol drinks/day: 0     Alcohol type: all     >5/day in last 3 mos: yes     Alcohol Counseling: to STOP drinking     Feels  need to cut down: yes     Smoking Status: current     Smoking Cessation Counseling: yes     Packs/Day: 1 ppd  Caffeine-Diet-Exercise     Does Patient Exercise: no  Current Medications (verified): 1)  Rena-Vite Rx 1 Mg Tabs (B Complex-C-Folic Acid) .... Take 1 Tablet By Mouth Once A Day 2)  Calcium Acetate 667 Mg Caps (Calcium Acetate (Phos Binder)) .... Take 1 Tablet By Mouth Three Times A Day 3)  Spiriva Handihaler 18 Mcg Caps (Tiotropium Bromide Monohydrate) .... I Cap Inhaled Once Daily. 4)  Ventolin Hfa 108 (90 Base) Mcg/act Aers (Albuterol Sulfate) .... Inhale 2 Puffs Ever 4-6 Hours As Needed For Shortness of Breath. 5)  Aranesp (Albumin Free) 60 Mcg/0.42ml Soln (Darbepoetin Alfa-Polysorbate) .... Use Weekly During Dialysis 6)  Venofer 20 Mg/ml Soln (Iron Sucrose) .... 100mg  Infuse Over 30-16min At 105-291ml/hr Once Weekly During Dialysis 7)  Oxycodone Hcl 5 Mg Tabs (Oxycodone Hcl) .... Take 2 Tablets By Mouth Every 4 Hours As Needed For Pain 8)  Folic Acid 1 Mg Tabs (Folic Acid) .... Take 1 Tablet By Mouth Once A Day 9)  Thiamine Hcl 100 Mg Tabs (Thiamine Hcl) .... Take 1 Tablet By Mouth Once A Day 10)  Pantoprazole Sodium 40 Mg Tbec (Pantoprazole Sodium) .... Take 1 Tablet By Mouth Two Times A Day  Allergies (verified): No Known Drug Allergies  Social History: Packs/Day:  1 ppd  Review of Systems      See HPI       The patient complains of abdominal pain.  The patient denies anorexia, weight loss, and weight gain.    Physical Exam  General:  alert, well-developed, well-nourished, and well-hydrated.   Head:  normocephalic and atraumatic.   Eyes:  vision grossly intact, pupils equal, pupils round, and pupils reactive to light.   Neck:  supple and full ROM.   Lungs:  normal respiratory effort, no intercostal retractions, no accessory muscle use, normal breath sounds, no dullness, no fremitus, no crackles, and no wheezes.   Heart:  normal rate, regular rhythm, no murmur, no  gallop, no rub, and no JVD.   Abdomen:  soft tender to palpation in the RUQ and epigastrium, guarding+, and normal bowel sounds.   Msk:  normal ROM, no joint tenderness, and no joint swelling.   Pulses:  2+pulses b/l Extremities:  no cyanosis, clubbing   Impression & Recommendations:  Problem # 1:  Hosp for PEPTIC ULCER DISEASE (ICD-533.90) He reports sharp/stabbing pain in his RUQ and epigatrium. Differentials include gastritis vs PUD vs perforation vs gall stones vs pancreatitis . Given his presentation this looks like alcohol induced gastritis,perforation is very less likely.We will admit him to the hospital. May consider getting an upright CXR to rule out perforation. His updated medication list for this problem includes:    Pantoprazole Sodium 40 Mg Tbec (Pantoprazole sodium) .Marland Kitchen... 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 He says that he has not been drinking for weeks now. Will continue him on folic caid and thiamine.  Complete Medication List: 1)  Rena-vite Rx 1 Mg Tabs (B complex-c-folic acid) .... Take 1 tablet by mouth once a day 2)  Calcium Acetate 667 Mg Caps (Calcium acetate (phos binder)) .... Take 1 tablet by mouth three times a day 3)  Spiriva Handihaler 18 Mcg Caps (Tiotropium bromide monohydrate) .... I cap inhaled once daily. 4)  Ventolin Hfa 108 (90 Base) Mcg/act Aers (Albuterol sulfate) .... Inhale 2 puffs ever 4-6 hours as needed for shortness of breath. 5)  Aranesp (albumin Free) 60 Mcg/0.79ml Soln (Darbepoetin alfa-polysorbate) .... Use weekly during dialysis 6)  Venofer 20 Mg/ml Soln (Iron sucrose) .... 100mg  infuse over 30-35min at 105-22ml/hr once weekly during dialysis 7)  Oxycodone Hcl 5 Mg Tabs (Oxycodone hcl) .... Take 2 tablets by mouth every 4 hours as needed for pain 8)  Folic Acid 1 Mg Tabs (Folic acid) .... Take 1 tablet by mouth once a day 9)  Thiamine Hcl 100  Mg Tabs (Thiamine hcl) .... Take 1 tablet by mouth once a day 10)  Pantoprazole Sodium 40 Mg Tbec (Pantoprazole sodium) .... Take 1 tablet by mouth two times a day   Orders Added: 1)  Est. Patient Level V [59563]     Prevention & Chronic Care Immunizations   Influenza vaccine: Historical  (06/05/2009)   Influenza vaccine deferral: Deferred  (02/02/2009)    Tetanus booster: Not documented   Td booster deferral: Refused  (02/02/2009)    Pneumococcal vaccine: Historical  (07/12/2009)  Colorectal Screening   Hemoccult: Not documented    Colonoscopy: Not documented   Colonoscopy action/deferral: Refused  (  02/02/2009)  Other Screening   PSA: Not documented   PSA action/deferral: Discussion deferred  (02/02/2009)   Smoking status: current  (06/12/2010)   Smoking cessation counseling: yes  (06/12/2010)  Lipids   Total Cholesterol: Not documented   LDL: Not documented   LDL Direct: Not documented   HDL: Not documented   Triglycerides: Not documented  Hypertension   Last Blood Pressure: 146 / 91  (06/12/2010)   Serum creatinine: 4.96  (02/09/2009)   Serum potassium 5.0  (02/09/2009)  Self-Management Support :    Patient will work on the following items until the next clinic visit to reach self-care goals:     Medications and monitoring: take my medicines every day, check my blood pressure, bring all of my medications to every visit, weigh myself weekly  (06/12/2010)     Eating: drink diet soda or water instead of juice or soda, eat more vegetables, use fresh or frozen vegetables, eat baked foods instead of fried foods, eat fruit for snacks and desserts, limit or avoid alcohol  (06/12/2010)     Activity: take a 30 minute walk every day, park at the far end of the parking lot  (06/12/2010)    Hypertension self-management support: Written self-care plan, Education handout, Resources for patients handout  (06/12/2010)   Hypertension self-care plan printed.   Hypertension  education handout printed      Resource handout printed.

## 2010-09-04 NOTE — Discharge Summary (Signed)
Summary: Hospital Discharge Update    Hospital Discharge Update:  Date of Admission: 06/12/2010 Date of Discharge: 06/15/2010  Brief Summary:  This is a 55 year old male with PMH significant for alcohol abuse and ESRD on HD presented to the ED with  abdominal pain (RUQ and epigatric pain), vomitng with specks of blood and loose stool was found to have acute pancreatitis secondary to alcohol abuse (Lipase and amylase elevated).   Upright CXR  was negative for acute perforation. CT abdomen Mild hepatomegaly.  New bilateral  renal atrophy.  Otherwise benign- appearing abdomen and pelvis. Patient was managed concervatively and was discharged in stable condition.  2)  Alcohol abuse: Patient was found to have an alcohol level over 150 although patient reported not to had a drink since 45 days. Patient was placed on  the CIWA protocol. He developed delirium tremens during hospital admission which was controlled with ativan. SW was consulted for substance abuse cessation counseling.  3) ESRD- patient is currently on hemodialysis 3 times a week on Mondays, Wednesdays and Fridays. He missed one of the HD sessions and he recieved 2 sessions during his hospital admission.  4) AOCD : Hgb remained stable throughout hospital admission above his baseline. It was 12.5 on the day of discharge.  Aranesp was D/C during hospital admision.  Lab or other results pending at discharge:  Hepatitis C Quantitative- Viral Load  Other follow-up issues:  Please follow up on  Hepatitis C viral load .  Problem list changes:  Added new problem of ACUTE PANCREATITIS (ICD-577.0)  Medication list changes:  Removed medication of ARANESP (ALBUMIN FREE) 60 MCG/0.3ML SOLN (DARBEPOETIN ALFA-POLYSORBATE) Use weekly during dialysis Removed medication of OXYCODONE HCL 5 MG TABS (OXYCODONE HCL) Take 2 tablets by mouth every 4 hours as needed for pain Added new medication of TRAMADOL HCL 50 MG TABS (TRAMADOL HCL) Take one tablet by  mouth every 6hours as needed for pain - Signed Rx of TRAMADOL HCL 50 MG TABS (TRAMADOL HCL) Take one tablet by mouth every 6hours as needed for pain;  #80 x 0;  Signed;  Entered by: Almyra Deforest MD;  Authorized by: Almyra Deforest MD;  Method used: Electronically to [facility] #[id number]*, [address] [place], [place]  [address], Ph: [phone], Fax: [phone]  The medication, problem, and allergy lists have been updated.  Please see the dictated discharge summary for details.  Discharge medications:  RENA-VITE RX 1 MG TABS (B COMPLEX-C-FOLIC ACID) Take 1 tablet by mouth once a day CALCIUM ACETATE 667 MG CAPS (CALCIUM ACETATE (PHOS BINDER)) Take 1 tablet by mouth three times a day SPIRIVA HANDIHALER 18 MCG CAPS (TIOTROPIUM BROMIDE MONOHYDRATE) I CAP INHALED ONCE DAILY. VENTOLIN HFA 108 (90 BASE) MCG/ACT AERS (ALBUTEROL SULFATE) Inhale 2 puffs ever 4-6 hours as needed for shortness of breath. VENOFER 20 MG/ML SOLN (IRON SUCROSE) 100mg  Infuse over 30-53min at 105-239mL/hr once weekly during dialysis FOLIC ACID 1 MG TABS (FOLIC ACID) Take 1 tablet by mouth once a day THIAMINE HCL 100 MG TABS (THIAMINE HCL) Take 1 tablet by mouth once a day PANTOPRAZOLE SODIUM 40 MG TBEC (PANTOPRAZOLE SODIUM) Take 1 tablet by mouth two times a day TRAMADOL HCL 50 MG TABS (TRAMADOL HCL) Take one tablet by mouth every 6hours as needed for pain  Other patient instructions:  Please follow up with Dr Melida Quitter, at the [facility] on December 5th, 2011 at 4:00PM. Phone number- [phone] Please follow up with Renal during your Hemodialysis  treatment on Monday, Wednesday and Friday.   Note: Hospital Discharge Medications & Other Instructions handout was printed, one copy for patient and a second copy to be placed in hospital chart.  Prescriptions: TRAMADOL HCL 50 MG TABS (TRAMADOL HCL) Take one tablet by mouth every 6hours as needed for pain  #80 x 0   Entered and Authorized by:    Almyra Deforest MD   Signed by:   Almyra Deforest MD on 06/15/2010   Method used:   Electronically to        [id number]* (retail)       [address] [place], [place]  [address]       Ph: [phone]       Fax: [phone]   RxID:   [id number]

## 2010-09-05 ENCOUNTER — Inpatient Hospital Stay (HOSPITAL_COMMUNITY): Payer: Medicare Other

## 2010-09-05 DIAGNOSIS — A419 Sepsis, unspecified organism: Secondary | ICD-10-CM

## 2010-09-05 DIAGNOSIS — F10239 Alcohol dependence with withdrawal, unspecified: Secondary | ICD-10-CM

## 2010-09-05 DIAGNOSIS — J9600 Acute respiratory failure, unspecified whether with hypoxia or hypercapnia: Secondary | ICD-10-CM

## 2010-09-05 DIAGNOSIS — R6521 Severe sepsis with septic shock: Secondary | ICD-10-CM

## 2010-09-05 DIAGNOSIS — J189 Pneumonia, unspecified organism: Secondary | ICD-10-CM

## 2010-09-05 DIAGNOSIS — F10231 Alcohol dependence with withdrawal delirium: Secondary | ICD-10-CM

## 2010-09-05 LAB — RENAL FUNCTION PANEL
Albumin: 3 g/dL — ABNORMAL LOW (ref 3.5–5.2)
BUN: 45 mg/dL — ABNORMAL HIGH (ref 6–23)
Calcium: 8.2 mg/dL — ABNORMAL LOW (ref 8.4–10.5)
Calcium: 7.4 mg/dL — ABNORMAL LOW (ref 8.4–10.5)
Chloride: 97 mEq/L (ref 96–112)
Creatinine, Ser: 8.04 mg/dL — ABNORMAL HIGH (ref 0.4–1.5)
GFR calc Af Amer: 8 mL/min — ABNORMAL LOW (ref >60)
GFR calc non Af Amer: 7 mL/min — ABNORMAL LOW (ref >60)
Glucose, Bld: 105 mg/dL — ABNORMAL HIGH (ref 70–99)
Phosphorus: 9.3 mg/dL (ref 2.3–4.6)
Phosphorus: 9.3 mg/dL (ref 2.3–4.6)

## 2010-09-05 LAB — CBC
MCH: 32.1 pg (ref 26.0–34.0)
MCHC: 33.8 g/dL (ref 30.0–36.0)
Platelets: 127 10*3/uL — ABNORMAL LOW (ref 150–400)
RBC: 3.74 MIL/uL — ABNORMAL LOW (ref 4.22–5.81)
RDW: 18.2 % — ABNORMAL HIGH (ref 11.5–15.5)

## 2010-09-05 LAB — CARDIAC PANEL(CRET KIN+CKTOT+MB+TROPI)
CK, MB: 10.7 ng/mL (ref 0.3–4.0)
Total CK: 190 U/L (ref 7–232)
Total CK: 476 U/L — ABNORMAL HIGH (ref 7–232)

## 2010-09-05 NOTE — Discharge Summary (Signed)
  [name], [name]               ACCOUNT NO.:  [id number]  MEDICAL RECORD NO.:  [id number]          PATIENT TYPE:  INP  LOCATION:  6712                         FACILITY:  [facility]  PHYSICIAN:  Janetta Hora. Audie Stayer, MD  DATE OF BIRTH:  05/02/1956  DATE OF ADMISSION:  07/11/2010 DATE OF DISCHARGE:  07/13/2010                              DISCHARGE SUMMARY   REASON FOR ADMISSION:  Postoperative pain and edema status post AV graft placement.  SECONDARY DIAGNOSES:  End-stage renal disease, anemia, polysubstance abuse.  HOSPITAL COURSE:  The patient was admitted on July 11, 2010, from the emergency room after developing diffuse swelling and arm pain around the previous AV graft revision site in his left arm.  He was admitted for pain control and arm elevation.  His symptoms gradually improved over a few days in the hospital.  The Renal Service was consulted for dialysis issues during the hospitalization.  His AV graft was successfully used during the hospitalization.  He was discharged to home in improved condition on July 13, 2010.  DISCHARGE DIAGNOSIS:  Pain and arm swelling status post atrioventricular graft revision.  Follow up as needed with [facility].  No changes to medications or diet.     Janetta Hora. Merrillyn Ackerley, MD     CEF/MEDQ  D:  08/20/2010  T:  08/20/2010  Job:  [id number]  Electronically Signed by Fabienne Bruns MD on 09/05/2010 11:45:48 AM

## 2010-09-06 ENCOUNTER — Inpatient Hospital Stay (HOSPITAL_COMMUNITY): Payer: Medicare Other

## 2010-09-06 DIAGNOSIS — A419 Sepsis, unspecified organism: Secondary | ICD-10-CM

## 2010-09-06 DIAGNOSIS — J159 Unspecified bacterial pneumonia: Secondary | ICD-10-CM

## 2010-09-06 DIAGNOSIS — R6520 Severe sepsis without septic shock: Secondary | ICD-10-CM

## 2010-09-06 LAB — LACTIC ACID, PLASMA
Lactic Acid, Venous: 1 mmol/L (ref 0.5–2.2)
Lactic Acid, Venous: 0.5 mmol/L (ref 0.5–2.2)

## 2010-09-06 LAB — BLOOD GAS, ARTERIAL
Acid-base deficit: 8.3 mmol/L — ABNORMAL HIGH (ref 0.0–2.0)
Drawn by: 10006
FIO2: 100 %
MECHVT: 500 mL
MECHVT: 500 mL
O2 Saturation: 99.5 %
O2 Saturation: 98.9 %
PEEP: 5 cmH2O
Patient temperature: 98.6
RATE: 15 resp/min
RATE: 20 resp/min
pH, Arterial: 7.197 — CL (ref 7.350–7.450)

## 2010-09-06 LAB — DIFFERENTIAL
Basophils Absolute: 0 10*3/uL (ref 0.0–0.1)
Basophils Absolute: 0 10*3/uL (ref 0.0–0.1)
Basophils Relative: 0 % (ref 0–1)
Basophils Relative: 0 % (ref 0–1)
Eosinophils Absolute: 0 10*3/uL (ref 0.0–0.7)
Eosinophils Absolute: 0 10*3/uL (ref 0.0–0.7)
Eosinophils Relative: 0 % (ref 0–5)
Eosinophils Relative: 0 % (ref 0–5)
Lymphocytes Relative: 4 % — ABNORMAL LOW (ref 12–46)
Monocytes Absolute: 0.7 10*3/uL (ref 0.1–1.0)

## 2010-09-06 LAB — CBC
HCT: 29 % — ABNORMAL LOW (ref 39.0–52.0)
MCH: 30.7 pg (ref 26.0–34.0)
MCHC: 32.3 g/dL (ref 30.0–36.0)
Platelets: 76 10*3/uL — ABNORMAL LOW (ref 150–400)
RDW: 18 % — ABNORMAL HIGH (ref 11.5–15.5)
RDW: 18.3 % — ABNORMAL HIGH (ref 11.5–15.5)
WBC: 12.1 10*3/uL — ABNORMAL HIGH (ref 4.0–10.5)

## 2010-09-06 LAB — COMPREHENSIVE METABOLIC PANEL
BUN: 29 mg/dL — ABNORMAL HIGH (ref 6–23)
Calcium: 7.9 mg/dL — ABNORMAL LOW (ref 8.4–10.5)
Glucose, Bld: 145 mg/dL — ABNORMAL HIGH (ref 70–99)
Sodium: 142 mEq/L (ref 135–145)
Total Protein: 7.7 g/dL (ref 6.0–8.3)

## 2010-09-06 LAB — RENAL FUNCTION PANEL
CO2: 17 mEq/L — ABNORMAL LOW (ref 19–32)
Calcium: 6.7 mg/dL — ABNORMAL LOW (ref 8.4–10.5)
GFR calc Af Amer: 15 mL/min — ABNORMAL LOW (ref >60)
GFR calc non Af Amer: 13 mL/min — ABNORMAL LOW (ref >60)
Sodium: 141 mEq/L (ref 135–145)

## 2010-09-06 LAB — TYPE AND SCREEN
ABO/RH(D): O NEG
Antibody Screen: NEGATIVE

## 2010-09-06 LAB — CARBOXYHEMOGLOBIN
O2 Saturation: 99 %
O2 Saturation: 95.9 %
Total hemoglobin: 9.9 g/dL — ABNORMAL LOW (ref 13.5–18.0)

## 2010-09-06 LAB — GLUCOSE, CAPILLARY
Glucose-Capillary: 165 mg/dL — ABNORMAL HIGH (ref 70–99)
Glucose-Capillary: 146 mg/dL — ABNORMAL HIGH (ref 70–99)

## 2010-09-06 LAB — CARDIAC PANEL(CRET KIN+CKTOT+MB+TROPI)
Relative Index: 1.4 (ref 0.0–2.5)
Total CK: 676 U/L — ABNORMAL HIGH (ref 7–232)
Troponin I: 0.07 ng/mL — ABNORMAL HIGH (ref 0.00–0.06)

## 2010-09-06 LAB — PHOSPHORUS: Phosphorus: 9.6 mg/dL (ref 2.3–4.6)

## 2010-09-06 LAB — PROTIME-INR: Prothrombin Time: 16.4 seconds — ABNORMAL HIGH (ref 11.6–15.2)

## 2010-09-06 NOTE — Progress Notes (Signed)
Summary: change to omeprazole/ hla  Phone Note Other Incoming   Summary of Call: [facility], partnership w/ healthcare management request that pantoprazole be changed to omeprazole so ins will pay. Initial call taken by: Marin Roberts RN,  July 17, 2010 10:39 AM  Follow-up for Phone Call        ok will change to omeprazole  Follow-up by: [name],  July 17, 2010 11:55 AM    New/Updated Medications: OMEPRAZOLE 40 MG CPDR (OMEPRAZOLE) Take 1 tablet by mouth once a day Prescriptions: OMEPRAZOLE 40 MG CPDR (OMEPRAZOLE) Take 1 tablet by mouth once a day  #30 x 3   Entered and Authorized by:   [name]   Signed by:   [name] on 07/17/2010   Method used:   Electronically to        [facility] (retail)       [address]       [place], [place]  [address]       Ph: [phone]       Fax: [phone]   RxID:   [id number]

## 2010-09-06 NOTE — Miscellaneous (Signed)
Summary: Hosptial Admission  INTERNAL MEDICINE ADMISSION HISTORY AND PHYSICAL Attending: Dr. Coralee Pesa First Contact: Dr. Cathey Endow [phone] Second Contact: Dr. Eben Burow [phone] PCP: Dr. Baltazar Apo CC: Abdominal Pain HPI:  This is 55 year old man with a hx COPD, hepatitis C, ESRD and PSA who presents with a 1 day hx of severe abdominal pain.  Apparently, pt drank about a fifth of vodka last night which is not atypical for him, after which he developed sharp, 10/10 epigastric pain that radiates down to the umblicus.  The pt soon after started vomiting and vomited about every 10 mins over night.   Pt denies any fevers or chills but states that he did have some streaky blood in his emesis.  Pt also states that he feels as though he may be withdrawing from alcohol as he has been hallucinating "seeing things on the wall".    Of note, the pt states that he has not quit drinking because he has been afraid he would withdraw.  Pt states that now that he is in the hospital he really wants to quit using alcohol and would like to get involved in a rehabilitation program.   The pt denies any SOB, chest pain, dark or black stools, or any recent weight change.   ALLERGIES:  NKDA  PAST MEDICAL HISTORY: Chronic Pancytopenia Polysubstance abuse Chronic Kidney Disease, stage V Malignant Hypertension Hepatitis C COPD Peptic ulcer disease  MEDICATIONS: RENA-VITE RX 1 MG TABS (B COMPLEX-C-FOLIC ACID) Take 1 tablet by mouth once a day CALCIUM ACETATE 667 MG CAPS (CALCIUM ACETATE (PHOS BINDER)) Take 1 tablet by mouth three times a day SPIRIVA HANDIHALER 18 MCG CAPS (TIOTROPIUM BROMIDE MONOHYDRATE) I CAP INHALED ONCE DAILY. VENTOLIN HFA 108 (90 BASE) MCG/ACT AERS (ALBUTEROL SULFATE) Inhale 2 puffs ever 4-6 hours as needed for shortness of breath. VENOFER 20 MG/ML SOLN (IRON SUCROSE) 100mg  Infuse over 30-57min at 105-267mL/hr once weekly during dialysis FOLIC ACID 1 MG TABS (FOLIC ACID) Take 1 tablet by mouth once a  day THIAMINE HCL 100 MG TABS (THIAMINE HCL) Take 1 tablet by mouth once a day OMEPRAZOLE 40 MG CPDR (OMEPRAZOLE) Take 1 tablet by mouth once a day TRAMADOL HCL 50 MG TABS (TRAMADOL HCL) Take one tablet by mouth every 6hours as needed for pain   SOCIAL HISTORY: Unemployed Divorced Current Smoker (1 pack a day) Alcohol use-quit prior drank a fifth and a half of vodka daily. Currently living in the [facility].    FAMILY HISTORY Family History: Family History of Alcoholism/Addiction Family History of Anxiety Family History High cholesterol Family History Hypertension Family History of Stroke F 1st degree relative <60   ROS: Negative as per HPI.  VITALS:  T: 98.6 P:89  BP: 153/109 R: 19 O2SAT:99%  ON:RA PHYSICAL EXAM: General:  alert, well-developed, and cooperative to examination.   Head:  normocephalic and atraumatic.   Eyes:  vision grossly intact, pupils equal, pupils round, pupils reactive to light, no injection and anicteric.   Mouth:  pharynx pink and moist, no erythema, and no exudates.   Neck:  supple, full ROM, no thyromegaly, no JVD, and no carotid bruits.   Lungs:  normal respiratory effort, no accessory muscle use, normal breath sounds, no crackles, and no wheezes.  Heart:  normal rate, regular rhythm, no murmur, no gallop, and no rub.   Abdomen:  Diffuse tenderness, normal bowel sounds, no distention, voluntary guarding, no rebound tenderness, no hepatomegaly, and no splenomegaly.   Msk:  no joint swelling, no joint  warmth, and no redness over joints.   Pulses:  2+ DP/PT pulses bilaterally Extremities:  No cyanosis, clubbing, edema  Neurologic:  alert & oriented X3, cranial nerves II-XII intact, strength normal in all extremities, sensation intact to light touch, and gait normal.   Skin:  turgor normal and no rashes.   Psych:  Oriented X3, memory intact for recent and remote, normally interactive, good eye contact, not anxious appearing, and not  depressed  appearing.  LABS:  WBC                                      3.0        l      4.0-10.5         K/uL  RBC                                      3.37       l      4.22-5.81        MIL/uL  Hemoglobin (HGB)                         10.5       l      13.0-17.0        g/dL  Hematocrit (HCT)                         30.4       l      39.0-52.0        %  MCV                                      90.2              78.0-100.0       fL  MCH -                                    31.2              26.0-34.0        pg  MCHC                                     34.5              30.0-36.0        g/dL  RDW                                      15.1              11.5-15.5        %  Platelet Count (PLT)                     53         l      150-400          K/uL  Neutrophils, %  33         l      43-77            %  Lymphocytes, %                           55         h      12-46            %  Monocytes, %                             9                 3-12             %  Eosinophils, %                           2                 0-5              %  Basophils, %                             1                 0-1              %  Neutrophils, Absolute                    1.0        l      1.7-7.7          K/uL  Lymphocytes, Absolute                    1.6               0.7-4.0          K/uL  Monocytes, Absolute                      0.3               0.1-1.0          K/uL  Eosinophils, Absolute                    0.1               0.0-0.7          K/uL  Basophils, Absolute                      0.0               0.0-0.1          K/uL    Sodium (NA)                              131        l      135-145          mEq/L  Potassium (K)  3.6               3.5-5.1          mEq/L  Chloride                                 97                96-112           mEq/L  CO2                                      22                19-32            mEq/L  Glucose                                   92                70-99            mg/dL  BUN                                      34         h      6-23             mg/dL  Creatinine                               5.92       h      0.4-1.5          mg/dL  GFR, Est Non African American            10         l      >60              mL/min  GFR, Est African American                12         l      >60              mL/min    Oversized comment, see footnote  1  Calcium                                  7.7        l      8.4-10.5         mg/dL  Alcohol                                  221        h      0-10             mg/dL   Lipase  132        h      11-59            U/L  IMAGING: Acute abdominal series:   1.  Nonobstructive bowel gas pattern.  ASSESSMENT AND PLAN: This is a 55 year old with multiple medical comorbidity including COPD, Hepatitis C, and alcoholism who presents with abdominal pain most consistent with acute pancreatitis.   -Acute pancreatis: Given the clinical picture and lipase of 132, acute pancreatitis is most likely although the pt reportedly has a hx of peptic ulcer disease which could be exacerbating his condition.  For now, we will treat with pain control using morphine and place the patient on bowel rest.   -Hematemesis: Likely a Malory wise tear though the DDX would include bleeding peptic ulcer.  Will check FOBT and continue to monitor the pts CBC and hemodynamic status. Will also start protonix 40mg  two times a day IV.   -Alcoholism: Pt has been drinking heavily for 3-4 years and and states that he has "started to withdraw" in the past.  Will place pt on CIWA protocol while here.  Pt is also interested in a rehabilitation program after DC.  Will aslo start thiamine and folic acid given pts long hx of alcoholism.  -ESRD: Pt receives dialysis MWF.  Renal has been consulted and will assist in the management of his dialysis while in house.   - Hx malignant  hypertension: pt currently not on any BP medications will monitor over night after appropriate pain control and determine if any medications should be instated while in house.   -COPD: Cont home meds.   -HX of pancytopenia- Likely secondary to alcohol abuse.  Pts labs consistent with prior.   -VTE PROPH: lovenox  Attending Physician: I have seen and examined the patient. I reviewed the resident/fellow note and agree with the findings and plan of care as documented. My additions and revisions are included.    Name:  Date:  Clinical Lists Changes

## 2010-09-06 NOTE — Initial Assessments (Signed)
INTERNAL MEDICINE ADMISSION HISTORY AND PHYSICAL ADMISSION DATE: 08/16/2010 ATTENDING: DR. Coralee Pesa R1 - DR. ISAMAH [phone] R2 - DR. Eben Burow [phone]  PCP:  Laurel Dimmer MD  CC: SI, Intoxication.  HPI: 55 year old man with a hx COPD, hepatitis C, ESRD and PSA who presents with suicidal ideation. Patient states that he was told he would be kicked out of dialysis unless stopped drinking, and he says that now he wants to kill himself by cutting his wrists. He reports drinking heavily the day before admission but is willing to stop drinking and wants to go to rehab. In addition, pt is reporting genearlized abdominal pain with nausea and poor appetite, no urinary concerns, no diarrhea, no blood in urine or stool.   ALLERGIES: NKDA  PAST MEDICAL HISTORY: Chronic Pancytopenia Polysubstance abuse ESRD - on MWF HD Malignant Hypertension Hepatitis C COPD  h/o PUD h/o hemetemesis, likely 2/2 Mallory Weiss tear h/o pancreatitis - last admitted Aug 04, 2010 for this and pt left AMA on Aug 10, 2010  MEDICATIONS: RENAVITE RX 1 MG TABS  Take 1 tablet by mouth once a day CALCIUM ACETATE 667 MG CAPS Take 1 tablet by mouth three times a day SPIRIVA HANDIHALER 18 MCG CAPS  I CAP INHALED ONCE DAILY. VENTOLIN HFA 108 (90 BASE) MCG/ACT  Inhale 2 puffs ever 4-6 hours as needed for shortness of breath. VENOFER 20 MG/ML 100mg  Infuse over 30-63min at 105-2108mL/hr once weekly during dialysis FOLIC ACID 1 MG TABS (FOLIC ACID) Take 1 tablet by mouth once a day THIAMINE HCL 100 MG TABS (THIAMINE HCL) Take 1 tablet by mouth once a day OMEPRAZOLE 40 MG CPDR (OMEPRAZOLE) Take 1 tablet by mouth once a day TRAMADOL HCL 50 MG TABS Take one tablet by mouth every 6hours as needed for pain   SOCIAL HISTORY: Unemployed Divorced Current Smoker (1 pack a day) Alcohol use-quit prior drank a fifth and a half of vodka daily. Currently living in the [facility].    FAMILY  HISTORY Alcoholism/Addiction Family History of Anxiety Family History High cholesterol Family History Hypertension Family History of Stroke F 1st degree relative <60   ROS: As per HPI; otherwise negative  VITALS: T: 100 F  P:94  BP:133/76  R:18  O2SAT: 95%  ON: RA  PHYSICAL EXAM: Gen: cachectic man, appears in distress due to pain and tremors Eyes: PERRL, EOMI, anicteric ENT: dry MM, OP clear, poor dental care Neck: Supple, full ROM Resp: CTA- Bilaterally, No W/C/R CVS: RRR, No Murmurs GI: epigastric tenderness > lower abd area tenderness, no guarding, no rebound, BS + Ext: No pedal edema. GU: No CVA tenderness. Skin: No visible rashes. MS: Moving all 4 extremities. Neuro: A&O X3, CN II - XII are grossly intact. Motor strength is 5/5 in the all 4 extremities, Sensations intact to light touch, tremor +, no asterixis Psych: teary eyed  LABS:   (CBC from 08/15/2010)     WBC                                      4.8               4.0-10.5         K/uL  RBC  3.44       l      4.22-5.81        MIL/uL  Hemoglobin (HGB)                         10.7       l      13.0-17.0        g/dL  Hematocrit (HCT)                         31.7       l      39.0-52.0        %  MCV                                      92.2              78.0-100.0       fL  MCH -                                    31.1              26.0-34.0        pg  MCHC                                     33.8              30.0-36.0        g/dL  RDW                                      15.3              11.5-15.5        %  Platelet Count (PLT)                     289               150-400          K/uL  Neutrophils, %                           42         l      43-77            %  Lymphocytes, %                           43                12-46            %  Monocytes, %                             13         h      3-12             %  Eosinophils, %  0                  0-5              %  Basophils, %                             1                 0-1              %  Neutrophils, Absolute                    2.0               1.7-7.7          K/uL  Lymphocytes, Absolute                    2.1               0.7-4.0          K/uL  Monocytes, Absolute                      0.6               0.1-1.0          K/uL  Eosinophils, Absolute                    0.0               0.0-0.7          K/uL  Basophils, Absolute                      0.1               0.0-0.1          K/uL   (BMET from 08/16/2010)  Sodium (NA)                              140               135-145          mEq/L  Potassium (K)                            5.2        h      3.5-5.1          mEq/L  Chloride                                 104               96-112           mEq/L  CO2                                      22                19-32            mEq/L  Glucose  99                70-99            mg/dL  BUN                                      91         h      6-23             mg/dL  Creatinine                               9.81       h      0.4-1.5          mg/dL  Calcium                                  8.0        l      8.4-10.5         mg/dL  Bilirubin, Total                         0.6               0.3-1.2          mg/dL  Bilirubin, Direct                        0.1               0.0-0.3          mg/dL  Indirect Bilirubin                       0.5               0.3-0.9          mg/dL  Alkaline Phosphatase                     68                39-117           U/L  SGOT (AST)                               1195       h      0-37             U/L  SGPT (ALT)                               478        h      0-53             U/L  Total  Protein                           6.7               6.0-8.3          g/dL  Albumin-Blood  2.8        l      3.5-5.2          g/dL   Lipase             91  Alcohol (08/15/2010)                     337         h      0-10             mg/dL  Alcohol (16/05/9603)                     282        h      0-10             mg/dL   Amphetamines                              SEE NOTE.        NDT    NONE DETECTED  Barbiturates                             SEE NOTE.         NDT  Benzodiazepines                          SEE NOTE.         NDT    NONE DETECTED  Cocaine                                  SEE NOTE.         NDT    NONE DETECTED  Opiates                                  SEE NOTE.         NDT    NONE DETECTED  Tetrahydrocannabinol                     SEE NOTE.         NDT    NONE DETECTED  Tricyclics                               SEE NOTE.         NDT    NONE DETECTED  IMAGING:  CHEST - 2 VIEW    Comparison: 06/22/2010 and earlier.    Findings: Large lung volumes.  Cardiac size and mediastinal   contours are within normal limits.  Visualized tracheal air column   is within normal limits.  Stable mild diffuse increased   interstitial markings.  Otherwise, the lungs are clear. No acute   osseous abnormality identified.    IMPRESSION:   Chronic hyperinflation. No acute cardiopulmonary abnormality.  ASSESSMENT AND PLAN:  (1) SI:  Patient with known history of depression.  He was initially to be admitted to behavioral health, however, ED physician concerned for alcohol withdrawal, so patient transferred for admission for detox.   - sitter - psychiatry consult   (2) alcohol intoxication:  Patient with long history of alcohol abuse complicated  by pancreatitis, withdrawal with DTs.  Patient, when sober, desires to quit drinking, however he has so far been unable to do so. Monitor in SDU for 24 hours. - Ativan 2mg  IV q6h scheduled plus 1mg  IV q2h as needed - CIWA (folate, thiamine)  (3) acute pancreatitis - in the setting of increased lipase and LFTs, most likely secondary to alcohol intoxication, will also check lipid panel for TG's and will check abdominal US since his pain is generalized  and worse compared to previous episodes. Will provide pain control and phenergan for nausea and will focus on bowel rest. Hold off on fluids since he is ESRD pt and has skipped several HD treatments. Keep NPO for now.   (4) ESRD:  On HD MWF.  Pt symptoms likely unrelated to uremia at this point, more likely related to alcohol intoxication and depression. - will consult renal in for HD  (5) elevated LFTs: significantly elevated compared to his usual baseline, and most likely secondary to alcohol intoxication, will also check salicilate level and acetaminophen levels given the AG of 14 and suicidal attempt.  (7) h/o malignant hypertension: pt currently not on any BP medications. Will monitor overnight; pt due for HD today as MWF schedule. BP currenlt WNL  (8) COPD: Cont home meds.   (9) h/o pancytopenia - Likely secondary to alcohol abuse; check cbc in the AM  (10)VTE PROPH: lovenox 30 mg subq daily  Attending:  _______________________________________

## 2010-09-07 ENCOUNTER — Inpatient Hospital Stay (HOSPITAL_COMMUNITY): Payer: Medicare Other

## 2010-09-07 DIAGNOSIS — F10239 Alcohol dependence with withdrawal, unspecified: Secondary | ICD-10-CM

## 2010-09-07 LAB — POCT I-STAT 3, ART BLOOD GAS (G3+)
Acid-base deficit: 8 mmol/L — ABNORMAL HIGH (ref 0.0–2.0)
Bicarbonate: 19.1 mEq/L — ABNORMAL LOW (ref 20.0–24.0)
TCO2: 20 mmol/L (ref 0–100)
TCO2: 21 mmol/L (ref 0–100)
pCO2 arterial: 44.7 mmHg (ref 35.0–45.0)
pH, Arterial: 7.226 — ABNORMAL LOW (ref 7.350–7.450)
pH, Arterial: 7.223 — ABNORMAL LOW (ref 7.350–7.450)
pO2, Arterial: 79 mmHg — ABNORMAL LOW (ref 80.0–100.0)
pO2, Arterial: 78 mmHg — ABNORMAL LOW (ref 80.0–100.0)

## 2010-09-07 LAB — GLUCOSE, CAPILLARY
Glucose-Capillary: 119 mg/dL — ABNORMAL HIGH (ref 70–99)
Glucose-Capillary: 149 mg/dL — ABNORMAL HIGH (ref 70–99)
Glucose-Capillary: 163 mg/dL — ABNORMAL HIGH (ref 70–99)
Glucose-Capillary: 135 mg/dL — ABNORMAL HIGH (ref 70–99)
Glucose-Capillary: 129 mg/dL — ABNORMAL HIGH (ref 70–99)

## 2010-09-07 LAB — BASIC METABOLIC PANEL
CO2: 17 mEq/L — ABNORMAL LOW (ref 19–32)
Calcium: 7.2 mg/dL — ABNORMAL LOW (ref 8.4–10.5)
Chloride: 106 mEq/L (ref 96–112)
GFR calc Af Amer: 10 mL/min — ABNORMAL LOW (ref >60)
Potassium: 5.1 mEq/L (ref 3.5–5.1)
Sodium: 140 mEq/L (ref 135–145)

## 2010-09-07 LAB — RENAL FUNCTION PANEL
Albumin: 2.3 g/dL — ABNORMAL LOW (ref 3.5–5.2)
Phosphorus: 7.5 mg/dL — ABNORMAL HIGH (ref 2.3–4.6)
Potassium: 3.9 mEq/L (ref 3.5–5.1)
Sodium: 134 mEq/L — ABNORMAL LOW (ref 135–145)

## 2010-09-07 LAB — CBC
HCT: 29.2 % — ABNORMAL LOW (ref 39.0–52.0)
Hemoglobin: 9.4 g/dL — ABNORMAL LOW (ref 13.0–17.0)
RBC: 3.03 MIL/uL — ABNORMAL LOW (ref 4.22–5.81)
WBC: 10.6 10*3/uL — ABNORMAL HIGH (ref 4.0–10.5)

## 2010-09-08 LAB — RENAL FUNCTION PANEL
BUN: 40 mg/dL — ABNORMAL HIGH (ref 6–23)
CO2: 31 mEq/L (ref 19–32)
Chloride: 92 mEq/L — ABNORMAL LOW (ref 96–112)
Glucose, Bld: 129 mg/dL — ABNORMAL HIGH (ref 70–99)
Potassium: 3.4 mEq/L — ABNORMAL LOW (ref 3.5–5.1)

## 2010-09-08 LAB — CBC
Hemoglobin: 9.3 g/dL — ABNORMAL LOW (ref 13.0–17.0)
MCH: 30.7 pg (ref 26.0–34.0)
MCV: 94.7 fL (ref 78.0–100.0)
RBC: 3.03 MIL/uL — ABNORMAL LOW (ref 4.22–5.81)

## 2010-09-08 LAB — GLUCOSE, CAPILLARY: Glucose-Capillary: 105 mg/dL — ABNORMAL HIGH (ref 70–99)

## 2010-09-08 LAB — APTT: aPTT: 40 seconds — ABNORMAL HIGH (ref 24–37)

## 2010-09-09 ENCOUNTER — Inpatient Hospital Stay (HOSPITAL_COMMUNITY): Payer: Medicare Other

## 2010-09-09 LAB — BASIC METABOLIC PANEL
BUN: 61 mg/dL — ABNORMAL HIGH (ref 6–23)
CO2: 30 mEq/L (ref 19–32)
Chloride: 90 mEq/L — ABNORMAL LOW (ref 96–112)
Glucose, Bld: 177 mg/dL — ABNORMAL HIGH (ref 70–99)
Potassium: 3.4 mEq/L — ABNORMAL LOW (ref 3.5–5.1)
Sodium: 134 mEq/L — ABNORMAL LOW (ref 135–145)

## 2010-09-09 LAB — BLOOD GAS, ARTERIAL
Acid-Base Excess: 6.5 mmol/L — ABNORMAL HIGH (ref 0.0–2.0)
TCO2: 32.5 mmol/L (ref 0–100)
pCO2 arterial: 49.2 mmHg — ABNORMAL HIGH (ref 35.0–45.0)
pH, Arterial: 7.416 (ref 7.350–7.450)
pO2, Arterial: 125 mmHg — ABNORMAL HIGH (ref 80.0–100.0)

## 2010-09-09 LAB — CBC
HCT: 29.6 % — ABNORMAL LOW (ref 39.0–52.0)
Hemoglobin: 9.7 g/dL — ABNORMAL LOW (ref 13.0–17.0)
MCV: 92.5 fL (ref 78.0–100.0)
RBC: 3.2 MIL/uL — ABNORMAL LOW (ref 4.22–5.81)
WBC: 2.8 10*3/uL — ABNORMAL LOW (ref 4.0–10.5)

## 2010-09-09 LAB — GLUCOSE, CAPILLARY
Glucose-Capillary: 176 mg/dL — ABNORMAL HIGH (ref 70–99)
Glucose-Capillary: 189 mg/dL — ABNORMAL HIGH (ref 70–99)
Glucose-Capillary: 149 mg/dL — ABNORMAL HIGH (ref 70–99)

## 2010-09-09 LAB — HIV ANTIBODY (ROUTINE TESTING W REFLEX): HIV: NONREACTIVE

## 2010-09-09 LAB — CULTURE, RESPIRATORY W GRAM STAIN

## 2010-09-10 ENCOUNTER — Inpatient Hospital Stay (HOSPITAL_COMMUNITY): Payer: Medicare Other

## 2010-09-10 LAB — PHOSPHORUS: Phosphorus: 5.8 mg/dL — ABNORMAL HIGH (ref 2.3–4.6)

## 2010-09-10 LAB — POCT I-STAT 3, ART BLOOD GAS (G3+)
Acid-Base Excess: 12 mmol/L — ABNORMAL HIGH (ref 0.0–2.0)
O2 Saturation: 92 %
Patient temperature: 98.6
TCO2: 41 mmol/L (ref 0–100)
TCO2: 35 mmol/L (ref 0–100)
pCO2 arterial: 47.5 mmHg — ABNORMAL HIGH (ref 35.0–45.0)
pH, Arterial: 7.46 — ABNORMAL HIGH (ref 7.350–7.450)

## 2010-09-10 LAB — MAGNESIUM: Magnesium: 2.3 mg/dL (ref 1.5–2.5)

## 2010-09-10 LAB — CULTURE, BLOOD (ROUTINE X 2): Culture: NO GROWTH

## 2010-09-10 LAB — GLUCOSE, CAPILLARY
Glucose-Capillary: 134 mg/dL — ABNORMAL HIGH (ref 70–99)
Glucose-Capillary: 133 mg/dL — ABNORMAL HIGH (ref 70–99)
Glucose-Capillary: 116 mg/dL — ABNORMAL HIGH (ref 70–99)
Glucose-Capillary: 96 mg/dL (ref 70–99)

## 2010-09-10 LAB — BASIC METABOLIC PANEL
Calcium: 8.2 mg/dL — ABNORMAL LOW (ref 8.4–10.5)
GFR calc non Af Amer: 7 mL/min — ABNORMAL LOW (ref >60)
Glucose, Bld: 132 mg/dL — ABNORMAL HIGH (ref 70–99)
Sodium: 135 mEq/L (ref 135–145)

## 2010-09-10 LAB — CBC
HCT: 24.9 % — ABNORMAL LOW (ref 39.0–52.0)
HCT: 25 % — ABNORMAL LOW (ref 39.0–52.0)
Hemoglobin: 8.1 g/dL — ABNORMAL LOW (ref 13.0–17.0)
MCH: 30 pg (ref 26.0–34.0)
MCHC: 32.5 g/dL (ref 30.0–36.0)
MCHC: 34 g/dL (ref 30.0–36.0)
Platelets: 86 10*3/uL — ABNORMAL LOW (ref 150–400)
RDW: 16.1 % — ABNORMAL HIGH (ref 11.5–15.5)
RDW: 16 % — ABNORMAL HIGH (ref 11.5–15.5)

## 2010-09-10 LAB — APTT: aPTT: 37 seconds (ref 24–37)

## 2010-09-10 LAB — COMPREHENSIVE METABOLIC PANEL
CO2: 28 mEq/L (ref 19–32)
Calcium: 8.1 mg/dL — ABNORMAL LOW (ref 8.4–10.5)
Creatinine, Ser: 7.38 mg/dL — ABNORMAL HIGH (ref 0.4–1.5)
GFR calc non Af Amer: 8 mL/min — ABNORMAL LOW (ref >60)
Glucose, Bld: 131 mg/dL — ABNORMAL HIGH (ref 70–99)
Sodium: 136 mEq/L (ref 135–145)
Total Protein: 5.5 g/dL — ABNORMAL LOW (ref 6.0–8.3)

## 2010-09-11 DIAGNOSIS — F10239 Alcohol dependence with withdrawal, unspecified: Secondary | ICD-10-CM

## 2010-09-11 LAB — MAGNESIUM: Magnesium: 2 mg/dL (ref 1.5–2.5)

## 2010-09-11 LAB — RENAL FUNCTION PANEL
CO2: 27 mEq/L (ref 19–32)
Chloride: 105 mEq/L (ref 96–112)
GFR calc Af Amer: 16 mL/min — ABNORMAL LOW (ref >60)
GFR calc non Af Amer: 13 mL/min — ABNORMAL LOW (ref >60)
Sodium: 141 mEq/L (ref 135–145)

## 2010-09-11 LAB — CBC
HCT: 30.1 % — ABNORMAL LOW (ref 39.0–52.0)
Hemoglobin: 9.9 g/dL — ABNORMAL LOW (ref 13.0–17.0)
MCHC: 32.9 g/dL (ref 30.0–36.0)
MCV: 93.8 fL (ref 78.0–100.0)
WBC: 3.7 10*3/uL — ABNORMAL LOW (ref 4.0–10.5)

## 2010-09-12 ENCOUNTER — Inpatient Hospital Stay (HOSPITAL_COMMUNITY): Payer: Medicare Other

## 2010-09-12 LAB — CULTURE, BLOOD (ROUTINE X 2)
Culture  Setup Time: 201202020347
Culture: NO GROWTH

## 2010-09-12 LAB — CBC
Hemoglobin: 9.2 g/dL — ABNORMAL LOW (ref 13.0–17.0)
RBC: 3.03 MIL/uL — ABNORMAL LOW (ref 4.22–5.81)
WBC: 3.5 10*3/uL — ABNORMAL LOW (ref 4.0–10.5)

## 2010-09-12 LAB — MAGNESIUM: Magnesium: 2.1 mg/dL (ref 1.5–2.5)

## 2010-09-12 LAB — RENAL FUNCTION PANEL
CO2: 24 mEq/L (ref 19–32)
Calcium: 8.5 mg/dL (ref 8.4–10.5)
Chloride: 102 mEq/L (ref 96–112)
GFR calc Af Amer: 10 mL/min — ABNORMAL LOW (ref >60)
GFR calc non Af Amer: 8 mL/min — ABNORMAL LOW (ref >60)
Potassium: 3.5 mEq/L (ref 3.5–5.1)
Sodium: 140 mEq/L (ref 135–145)

## 2010-09-12 LAB — AMYLASE: Amylase: 117 U/L — ABNORMAL HIGH (ref 0–105)

## 2010-09-12 NOTE — Miscellaneous (Signed)
Summary: Hospital Admission  INTERNAL MEDICINE ADMISSION HISTORY AND PHYSICAL Attending: Dr. Onalee Hua First Contact: Dr. Cathey Endow [phone] Second Contact: Dr. Sherryll Burger [phone] PCP: Dr. Baltazar Apo CC: Alcohol Withdraw HPI:  This is a 55 year old male with a past medical history significant for polysubstance abuse, CKD stage V, and depression with suicidal ideation who presents in alcohol withdraw. Pt started drinking again soon after his previous discharge but ran out of money to purchase more alcohol yesterday (last drink 5pm yesterday).  Since that time, pt has developed abdominal pain associated with nausea and vomiting.  Pts abdominal pain is sharp, 10/10 and constant with no exacerbating/alleviating factors.  Pt does endorse several episode's of ?hematemesis since yesterday during which his clear sputum was mildly tinged with pink.  Pt denies any chest pain, worse that baseline SOB, fevers or chills, but does state that he does have occasional diarrhea.  Pt states that he now feels that he is withdrawing and has had increased shakiness since about earlier today.  These symptoms have been associated with feelings that bugs are crawling on him but he does deny any hallucinations.  On admission, pt specifically denies any SI, or HI.   ALLERGIES: NKDA  PAST MEDICAL HISTORY: Chronic Pancytopenia Polysubstance abuse Chronic Kidney Disease, stage V Malignant Hypertension Hepatitis C COPD Hx GI bleed found to have below on EGD 10/11.   1.Linear ulcer in the mid esophagus.      2. Prepyloric ulcer.      3. Small antral ulcer with antral gastritis.      4. Duodenitis.  MEDICATIONS: RENA-VITE RX 1 MG TABS (B COMPLEX-C-FOLIC ACID) Take 1 tablet by mouth once a day CALCIUM ACETATE 667 MG CAPS (CALCIUM ACETATE (PHOS BINDER)) Take 1 tablet by mouth three times a day SPIRIVA HANDIHALER 18 MCG CAPS (TIOTROPIUM BROMIDE MONOHYDRATE) I CAP INHALED ONCE DAILY. VENTOLIN HFA 108 (90 BASE) MCG/ACT AERS (ALBUTEROL  SULFATE) Inhale 2 puffs ever 4-6 hours as needed for shortness of breath. VENOFER 20 MG/ML SOLN (IRON SUCROSE) 100mg  Infuse over 30-37min at 105-252mL/hr once weekly during dialysis FOLIC ACID 1 MG TABS (FOLIC ACID) Take 1 tablet by mouth once a day THIAMINE HCL 100 MG TABS (THIAMINE HCL) Take 1 tablet by mouth once a day OMEPRAZOLE 40 MG CPDR (OMEPRAZOLE) Take 1 tablet by mouth once a day TRAMADOL HCL 50 MG TABS (TRAMADOL HCL) Take one tablet by mouth every 6hours as needed for pain Haldol 2mg  by mouth three times a day.   SOCIAL HISTORY: Unemployed Divorced Current Smoker (1/2pack a day) Alcohol use-relapsing use, prior drank 1/2 bottle of whiskey for years Currently living in the [employer].    FAMILY HISTORY Family History of Alcoholism/Addiction Family History of Anxiety Family History High cholesterol Family History Hypertension Family History of Stroke F 1st degree relative <60   ROS: Negative as per HPI.   VITALS: T: 98.7 P: 108 BP:155/104  R:22  O2SAT:98%  ON:RA PHYSICAL EXAM: General:  alert, well-developed, and cooperative to examination.   Head:  normocephalic and atraumatic.   Eyes:  vision grossly intact, pupils equal, pupils round, pupils reactive to light, no injection and anicteric.   Mouth:  pharynx pink and moist, no erythema, and no exudates.   Neck:  supple, full ROM, no thyromegaly, no JVD, and no carotid bruits.   Lungs:  normal respiratory effort, no accessory muscle use, coarse breath sounds bilaterally.  Heart:  normal rate, regular rhythm, 2/6 systolic murmur, no gallop, and no rub.  Abdomen:  soft, diffuse tenderness, normal bowel sounds, no distention, voluntary guarding, no rebound tenderness, no hepatomegaly, and no splenomegaly.   Msk:  no joint swelling, no joint warmth, and no redness over joints.   Pulses:  2+ DP/PT pulses bilaterally Extremities:  No cyanosis, clubbing, edema  Neurologic:  alert & oriented X3, cranial  nerves II-XII intact, strength normal in all extremities, sensation intact to light touch. Significant tremor in bilateral upper extremities.  Skin:  turgor normal and no rashes.   Psych:  Oriented X3, memory intact for recent and remote, normally interactive, good eye contact, not anxious appearing, and not depressed  appearing.  LABS: TCO2                                     6                 0-100            mmol/L  Ionized Calcium                          0.66       L      1.12-1.32        mmol/L  Hemoglobin (HGB)                         11.6       l      13.0-17.0        g/dL  Hematocrit (HCT)                         34.0       l      39.0-52.0        %  Sodium (NA)                              140               135-145          mEq/L  Potassium (K)                            5.6        h      3.5-5.1          mEq/L  Chloride                                 119        h      96-112           mEq/L  Glucose                                  130        h      70-99            mg/dL  BUN  96         h      6-23             mg/dL  Creatinine                               10.0       h      0.4-1.5          mg/dL   Bilirubin, Total                         0.6               0.3-1.2          mg/dL  Bilirubin, Direct                        0.2               0.0-0.3          mg/dL  Indirect Bilirubin                       0.4               0.3-0.9          mg/dL  Alkaline Phosphatase                     89                39-117           U/L  SGOT (AST)                               106        h      0-37             U/L  SGPT (ALT)                               112        h      0-53             U/L  Total  Protein                           6.9               6.0-8.3          g/dL  Albumin-Blood                            3.1        l      3.5-5.2          g/dL   WBC                                      5.9               4.0-10.5         K/uL  RBC  3.66       l      4.22-5.81        MIL/uL  Hemoglobin (HGB)                         11.4       l      13.0-17.0        g/dL  Hematocrit (HCT)                         34.6       l      39.0-52.0        %  MCV                                      94.5              78.0-100.0       fL  MCH -                                    31.1              26.0-34.0        pg  MCHC                                     32.9              30.0-36.0        g/dL  RDW                                      18.4       h      11.5-15.5        %  Platelet Count (PLT)                     143        l      150-400          K/uL  Neutrophils, %                           68                43-77            %  Lymphocytes, %                           26                12-46            %  Monocytes, %                             6                 3-12             %  Eosinophils, %  0                 0-5              %  Basophils, %                             0                 0-1              %  Neutrophils, Absolute                    4.0               1.7-7.7          K/uL  Lymphocytes, Absolute                    1.5               0.7-4.0          K/uL  Monocytes, Absolute                      0.4               0.1-1.0          K/uL  Eosinophils, Absolute                    0.0               0.0-0.7          K/uL  Basophils, Absolute                      0.0               0.0-0.1          K/uL   Alcohol                                  279        h      0-10             mg/dL   Potassium (K)                            5.6        h      3.5-5.1          mEq/L  IMAGING:  IMPRESSION:   No acute cardiopulmonary findings.   ASSESSMENT AND PLAN:  -alcohol intoxication:  Patient with long history of alcohol abuse complicated by pancreatitis and withdrawal with DTs.  Patient, when sober, desires to quit drinking, however he has so far been unable to do so.  Will admit to sdu,  NPO, with Ativan 2mg  IV q6h scheduled plus 1mg  IV q2h as needed, will initiate CIWA protocol and treat with folate and thiamine when no longer NPO.    -Abdominal pain/? hematemesis: No lipase was performed in the ED so we will perform when pt arrives to floor as pt was recently discharged with acute pancreatitis secondary to alcohol use with a lipase of 91.  Will make pt NPO for now and add add morphine if he requires further pain control.   -  Hyperkalemia: Pt has ESRD and pt missed his dialysis today.  Will discuss case with renal who will provide dialysis tomorrow. Will continue to monitor for now.  - ESRD:  On HD MWF.  Pt symptoms likely unrelated to uremia at this point, more likely related to alcohol intoxication and depression. Will consult renal in for HD  -elevated LFTs: These appear near pts baseline and are significantly improved over values performed on Jan 13th. This is felt to be  most likely secondary to alcohol intoxication.  - h/o malignant hypertension: pt currently not on any BP medications. Will monitor overnight; pt due for HD today as MWF schedule. BP currently only slightly elevated which is likely a sympathetic response vs mild volume overload secondary to missed dialysis. .    - COPD: Cont home meds.   - h/o pancytopenia - Likely secondary to alcohol abuse; check cbc in the AM  -VTE PROPH: lovenox 30 mg subq daily  Attending Physician: I have seen and examined the patient. I reviewed the resident/fellow note and agree with the findings and plan of care as documented. My additions and revisions are included.

## 2010-09-13 LAB — CBC
MCV: 94.6 fL (ref 78.0–100.0)
Platelets: 154 10*3/uL (ref 150–400)
RDW: 17.3 % — ABNORMAL HIGH (ref 11.5–15.5)
WBC: 4.4 10*3/uL (ref 4.0–10.5)

## 2010-09-13 LAB — RENAL FUNCTION PANEL
Calcium: 8.7 mg/dL (ref 8.4–10.5)
Glucose, Bld: 117 mg/dL — ABNORMAL HIGH (ref 70–99)
Phosphorus: 4 mg/dL (ref 2.3–4.6)
Sodium: 137 mEq/L (ref 135–145)

## 2010-09-13 LAB — MAGNESIUM: Magnesium: 1.9 mg/dL (ref 1.5–2.5)

## 2010-09-22 ENCOUNTER — Encounter: Payer: Self-pay | Admitting: Internal Medicine

## 2010-09-26 ENCOUNTER — Ambulatory Visit (HOSPITAL_COMMUNITY): Payer: Medicare Other

## 2010-09-26 ENCOUNTER — Encounter (HOSPITAL_COMMUNITY): Payer: Self-pay | Admitting: Radiology

## 2010-09-26 ENCOUNTER — Ambulatory Visit (HOSPITAL_COMMUNITY)
Admission: AD | Admit: 2010-09-26 | Discharge: 2010-09-26 | Disposition: A | Discharge status: Home / Self Care | Payer: Medicare Other | Source: Ambulatory Visit | Attending: Vascular Surgery | Admitting: Vascular Surgery

## 2010-09-26 DIAGNOSIS — I120 Hypertensive chronic kidney disease with stage 5 chronic kidney disease or end stage renal disease: Secondary | ICD-10-CM

## 2010-09-26 DIAGNOSIS — T82898A Other specified complication of vascular prosthetic devices, implants and grafts, initial encounter: Secondary | ICD-10-CM

## 2010-09-26 DIAGNOSIS — N186 End stage renal disease: Secondary | ICD-10-CM

## 2010-09-26 HISTORY — DX: Nicotine dependence, unspecified, uncomplicated: F17.200

## 2010-09-26 LAB — BASIC METABOLIC PANEL
BUN: 46 mg/dL — ABNORMAL HIGH (ref 6–23)
Calcium: 8.8 mg/dL (ref 8.4–10.5)
Creatinine, Ser: 9.24 mg/dL — ABNORMAL HIGH (ref 0.4–1.5)
GFR calc non Af Amer: 6 mL/min — ABNORMAL LOW (ref >60)
Glucose, Bld: 76 mg/dL (ref 70–99)
Sodium: 139 mEq/L (ref 135–145)

## 2010-09-26 LAB — HEPATIC FUNCTION PANEL
Bilirubin, Direct: 0.2 mg/dL (ref 0.0–0.3)
Indirect Bilirubin: 0 mg/dL — ABNORMAL LOW (ref 0.3–0.9)

## 2010-09-26 LAB — CBC
HCT: 28.1 % — ABNORMAL LOW (ref 39.0–52.0)
MCHC: 31.3 g/dL (ref 30.0–36.0)
MCV: 94.9 fL (ref 78.0–100.0)
RDW: 16.4 % — ABNORMAL HIGH (ref 11.5–15.5)

## 2010-09-26 LAB — SURGICAL PCR SCREEN: MRSA, PCR: NEGATIVE

## 2010-09-27 ENCOUNTER — Encounter: Payer: Self-pay | Admitting: Internal Medicine

## 2010-09-27 ENCOUNTER — Inpatient Hospital Stay (HOSPITAL_COMMUNITY)
Admission: AD | Admit: 2010-09-27 | Discharge: 2010-10-02 | DRG: 391 | Disposition: A | Discharge status: Home / Self Care | Payer: Medicare Other | Source: Ambulatory Visit | Attending: Infectious Disease | Admitting: Infectious Disease

## 2010-09-27 ENCOUNTER — Inpatient Hospital Stay (HOSPITAL_COMMUNITY): Payer: Medicare Other

## 2010-09-27 ENCOUNTER — Ambulatory Visit (INDEPENDENT_AMBULATORY_CARE_PROVIDER_SITE_OTHER): Payer: Medicare Other | Admitting: Internal Medicine

## 2010-09-27 VITALS — BP 174/106 | HR 108 | Temp 98.5°F | Ht 72.0 in | Wt 150.7 lb

## 2010-09-27 DIAGNOSIS — J189 Pneumonia, unspecified organism: Secondary | ICD-10-CM | POA: Diagnosis present

## 2010-09-27 DIAGNOSIS — I120 Hypertensive chronic kidney disease with stage 5 chronic kidney disease or end stage renal disease: Secondary | ICD-10-CM | POA: Diagnosis present

## 2010-09-27 DIAGNOSIS — K7460 Unspecified cirrhosis of liver: Secondary | ICD-10-CM | POA: Diagnosis present

## 2010-09-27 DIAGNOSIS — N186 End stage renal disease: Secondary | ICD-10-CM | POA: Diagnosis present

## 2010-09-27 DIAGNOSIS — J90 Pleural effusion, not elsewhere classified: Secondary | ICD-10-CM | POA: Diagnosis present

## 2010-09-27 DIAGNOSIS — J4489 Other specified chronic obstructive pulmonary disease: Secondary | ICD-10-CM | POA: Diagnosis present

## 2010-09-27 DIAGNOSIS — F1010 Alcohol abuse, uncomplicated: Secondary | ICD-10-CM

## 2010-09-27 DIAGNOSIS — J449 Chronic obstructive pulmonary disease, unspecified: Secondary | ICD-10-CM | POA: Diagnosis present

## 2010-09-27 DIAGNOSIS — N039 Chronic nephritic syndrome with unspecified morphologic changes: Secondary | ICD-10-CM | POA: Diagnosis present

## 2010-09-27 DIAGNOSIS — R0789 Other chest pain: Secondary | ICD-10-CM | POA: Diagnosis present

## 2010-09-27 DIAGNOSIS — N2581 Secondary hyperparathyroidism of renal origin: Secondary | ICD-10-CM | POA: Diagnosis present

## 2010-09-27 DIAGNOSIS — N185 Chronic kidney disease, stage 5: Secondary | ICD-10-CM

## 2010-09-27 DIAGNOSIS — R1084 Generalized abdominal pain: Secondary | ICD-10-CM

## 2010-09-27 DIAGNOSIS — Z7982 Long term (current) use of aspirin: Secondary | ICD-10-CM

## 2010-09-27 DIAGNOSIS — D631 Anemia in chronic kidney disease: Secondary | ICD-10-CM | POA: Diagnosis present

## 2010-09-27 DIAGNOSIS — R1012 Left upper quadrant pain: Principal | ICD-10-CM | POA: Diagnosis present

## 2010-09-27 DIAGNOSIS — I10 Essential (primary) hypertension: Secondary | ICD-10-CM

## 2010-09-27 DIAGNOSIS — K859 Acute pancreatitis without necrosis or infection, unspecified: Secondary | ICD-10-CM

## 2010-09-27 DIAGNOSIS — F1910 Other psychoactive substance abuse, uncomplicated: Secondary | ICD-10-CM

## 2010-09-27 DIAGNOSIS — B1920 Unspecified viral hepatitis C without hepatic coma: Secondary | ICD-10-CM | POA: Diagnosis present

## 2010-09-27 DIAGNOSIS — K861 Other chronic pancreatitis: Secondary | ICD-10-CM | POA: Diagnosis present

## 2010-09-27 DIAGNOSIS — Z992 Dependence on renal dialysis: Secondary | ICD-10-CM

## 2010-09-27 LAB — MAGNESIUM: Magnesium: 2 mg/dL (ref 1.5–2.5)

## 2010-09-27 LAB — DRUG SCREEN, URINE
Amphetamine Screen, Ur: NEGATIVE
Barbiturate Quant, Ur: NEGATIVE
Cocaine Metabolites: NEGATIVE
Phencyclidine (PCP): NEGATIVE

## 2010-09-27 LAB — COMPREHENSIVE METABOLIC PANEL
ALT: 18 U/L (ref 0–53)
BUN: 51 mg/dL — ABNORMAL HIGH (ref 6–23)
CO2: 21 mEq/L (ref 19–32)
Creat: 10.01 mg/dL — ABNORMAL HIGH (ref 0.40–1.50)
Total Bilirubin: 0.5 mg/dL (ref 0.3–1.2)

## 2010-09-27 LAB — PROTIME-INR: Prothrombin Time: 14.7 seconds (ref 11.6–15.2)

## 2010-09-27 LAB — LIPASE: Lipase: 161 U/L — ABNORMAL HIGH (ref 11–59)

## 2010-09-27 LAB — ETHANOL
Alcohol, Ethyl (B): <5 mg/dL (ref 0–10)
Alcohol, Ethyl (B): <5 mg/dL (ref 0–10)

## 2010-09-27 LAB — LACTATE DEHYDROGENASE: LDH: 261 U/L — ABNORMAL HIGH (ref 94–250)

## 2010-09-27 LAB — PHOSPHORUS: Phosphorus: 7 mg/dL — ABNORMAL HIGH (ref 2.3–4.6)

## 2010-09-27 MED ORDER — KETOROLAC TROMETHAMINE 30 MG/ML IJ SOLN
30.0000 mg | Freq: Once | INTRAMUSCULAR | Status: AC
  Administered 2010-09-27: 30 mg via INTRAMUSCULAR

## 2010-09-27 NOTE — Discharge Summary (Signed)
[name], [name]               ACCOUNT NO.:  [id number]  MEDICAL RECORD NO.:  [id number]           PATIENT TYPE:  I  LOCATION:  6711                         FACILITY:  [facility]  PHYSICIAN:  Lacretia Leigh. Ninetta Lights, M.D.DATE OF BIRTH:  Nov 06, 1955  DATE OF ADMISSION:  09/03/2010 DATE OF DISCHARGE:  09/13/2010                              DISCHARGE SUMMARY   CONTINUITY MD:  Melida Quitter, MD  DISCHARGE DIAGNOSES: 1. Ventilatory-dependent respiratory failure likely secondary to     aspiration pneumonia. 2. Septic shock. 3. Acute pancreatitis. 4. Alcohol withdrawal. 5. Hypertension. 6. End-stage renal disease on HD. 7. Chronic obstructive pulmonary disease. 8. Hepatitis C.  DISCHARGE MEDICATIONS WITH ACCURATE DOSES: 1. Albuterol 90 mcg inhale 2 puffs every 4 hours as needed for     shortness of breath. 2. Calcium carbonate 500 mg, take 1 tablet every 6 hours as needed. 3. Calcium acetate 667 mg tablet, take 1 tablet three times a day with     meals. 4. Darbepoetin 100 mcg, take one injection IV on every Monday with     hemodialysis. 5. Folic acid 1 mg, take 1 tablet by mouth daily. 6. Procardia XL 30 mg, take 1 tablet by mouth daily. 7. Nutrition supplement 237 mL, take three times daily as needed. 8. Zofran 4 mg, take 1 tablet by mouth every 6 hours as needed for     nausea. 9. Protonix 40 mg, take 1 tablet by mouth daily. 10.Renal Formula Vitamin, take 1 tablet by mouth daily at bedtime. 11.Thiamine 100 mg tablet, take 1 tablet by mouth daily. 12.Spiriva 18 mcg, take one capsule inhaled daily. 13.Percocet 5-325mg , take one tablet by mouth every 6 hours as needed for pain    for 5 days. 14. Klonipin 0.5mg , take one tablet by mouth twice daily for anxiety for 10 days.  DISPOSITION AND FOLLOWUP:  The patient was discharged home in stable and improved condition from [facility] on September 13, 2010.  The patient has been scheduled a followup at [facility] with Dr. Aldine Contes on September 28, 2010, at 3:15.  At that time, his BMET needs to be checked.  The patient was hypokalemic during his hospital stay, which could be related to nausea secondary to pancreatitis or alcohol abuse.  At that time, the patient needs to be again counseled on alcohol cessation.  He should be encouraged to follow up with his AA sponsor.  PROCEDURES PERFORMED: 1. Chest x-ray on September 03, 2010, which did not show any acute     cardiopulmonary process. 2. Chest x-ray on September 05, 2010, that showed new confluent air     space opacification in the left mid lung region.  The location of     his airspace disease could be in the superior segment of the left     lower lobe versus left upper lobe. 3. Chest x-ray on September 05, 2010, in the night that showed no     significant change in the confluent infiltrate in the left mid     lung.  Mild worsening of patchy air space disease in the right  midlung. 4. Chest x-ray on September 06, 2010, which showed high endotracheal     tube position, with dip approximately 8.5 cm above carina.  Left     jugular central venous catheter and nasogastric tube in appropriate     position.  No evidence of pneumothorax. 5. Stable midlung confluent infiltrate and mild airspace disease in     the right midlung. 6. Chest x-ray on September 07, 2010, which showed no significant     change. 7. Chest x-ray on September 09, 2010, which showed improving rounded     pneumonia in the left mid to lower lung zone.  Decreased     atelectasis in the right mid lung zone.  Stable mild cardiomegaly     and mild changes of COPD and chronic bronchitis. 8. Chest x-ray on September 10, 2010, which showed minimal change in the     left lung airspace disease.  Cannot exclude lucency within the     airspace disease.  Stable appearance of central line. 9. Intubation on September 05, 2010.  CONSULTATIONS:  1.Pulmonary Critical Care                   2.Renal.  BRIEF ADMITTING HISTORY AND PHYSICAL:  Mr. Radermacher is a 55 year old man with past medical history significant for polysubstance abuse, CKD stage V, and depression with suicidal ideation who presented with alcohol withdrawal.  The patient started drinking again soon after his previous discharge, but ran out of money to purchase more alcohol yesterday, last drink 5 p.m..  Since that time, the patient had developed abdominal pain associated with nausea and vomiting.  The patient's abdominal pain is sharp, 10/10 and constant with no exacerbating or alleviating factors.  The patient does endorse several episodes of question hematemesis since yesterday during which his clear sputum was mildly tinged with pink.  The patient denies any chest pain, at baseline shortness of breath, fever, or chills, but does state that he does have occasional diarrhea.  The patient states that he now feels that he is withdrawing and has increased shakiness since about earlier today. These symptoms have been associated with feelings that bugs are crawling on him, but he denies any hallucinations.  On admission, the patient subsequently denies any suicidal ideations or homicidal ideations.  VITALS: include temperature 98.7, pulse 108, blood pressure 155/104, respiratory rate 22, O2 sats 98% on room air.  PHYSICAL EXAM. GENERAL:alert, in no acute distress HEENT:PERRLA, EOMI, mucous membranes mosit and pink. LUNGS: clear to auscultation bilaterally. CVS: RRR, S1 , S2 normal, no M/R/G. ABDOMEN: soft, tender to palpation in the epigastrium, positive bowel sounds, no guarding or rigidity. NEUROLOGICAL:cranial nerves II-XII intact, motor strength- 5/5 in all four  extremities, sensations intact to light touch.  LABORATORY DATA:  His admission labs include hemoglobin 11.6, hematocrit 34.  Sodium 140, potassium 5.6, chloride 119, glucose 130, BUN 96, creatinine 10, SGOT 106, SGPT 112.  White cell count 5.9 and  platelets of 143.  HOSPITAL COURSE BY PROBLEM: 1. Alcohol withdrawal.  The patient came with symptoms of shakiness     and high blood pressure likely related to alcohol withdrawal.  He     was placed on CIWA protocol and was started back on     thiamine and folate.  Social worker consult was done for alcohol     cessation and the patient was advised to follow up with his Alcohol     Anonymous sponsor at the time of discharge. 2. Abdominal  pain.  The patient complained of abdominal pain     throughout his hospital stay.  The etiology for his abdominal pain     was multifactorial, which included gastritis per prior EGD     secondary to alcohol and pancreatitis with lipase of 96 at     admission.  The patient was made n.p.o. and his pain was controlled     with morphine.  The patient was also started on Protonix for     suspected gastritis.  Even later during his hospital stay due to     persistent abdominal pain, his lipase was rechecked, which came out     to be 148.  Again during that time the patient was made n.p.o. and     his diet was slowly advanced.  At the time of discharge, his     abdominal pain was stable with the recheck lipase levels of 77.  The     patient was tolerating his diet pretty well. 3. Ventilatory-dependent respiratory failure.  The patient's hospital     course was complicated by ventilatory-dependent respiratory failure     likely secondary to aspiration pneumonia.  The patient had a chest     x-ray, which showed an opacity in the left upper lobe.  Hospital-acquired     pneumonia was the other differential as the patient     was recently discharged from the hospital.  The patient was treated     with IV Solu-Cortef, Combivent, and vancomycin and     Zosyn.  The patient got intubated on September 05, 2010, and self-     extubated himself on September 09, 2010. 4. Septic shock.  The patient had fever, his blood pressure dropped,      requiring pressor support and  fitted into the criteria for septic     shock likely secondary to hospital-acquired pneumonia versus     aspiration pneumonia versus septic thrombophlebitis from his left     upper extremity fistula.  The patient was initially put on 2     pressors; Levophed and vasopressin requiring increased doses, but     as the patient met the septic shock protocol goals with MAP of more than     65, CVP of more than 8, and SvO2 of more than 70%, his pressors     were tapered.  The patient was completely off his pressors on     September 08, 2010.  During this period, his white cell count was     continuously monitored and it was trending down with     vancomycin and Zosyn, but the patient became neutropenic and     Zosyn was discontinued from his regimen.  The patient received 7-     day course of vancomycin for his hospital-acquired pneumonia versus     aspiration pneumonia. 5. End-stage renal disease.  Renal was on board and the patient was on     Monday, Wednesday, and Friday schedule for his dialysis.  During     his intubation, the patient got CVVH for 1 day. 6. Elevated LFTs.  This was thought likely secondary to hepatitis C     and alcohol.  His LFTs continued to trend down  from his admission values.     He was also tested for Hep A and B which was negative. 7. Thrombocytopenia.  This was likely secondary to his alcohol abuse.     His platelet count was continuously monitored throughout his stay.  His     platelet count was stapled at the time of discharge. 8. Hypertension.  After the patient self-     extubated, his blood pressure was running slightly high , so he was     started on Procardia XL as after which his blood pressure was     fairly well controlled.  DISCHARGE VITALS:  Temperature 97.3, pulse 67, respiratory rate 18, blood pressure 124/77, O2 sats 98% on room air.  DISCHARGE LABS:  White cell count 5.2, hemoglobin 11.2, hematocrit 33.6, platelets  189.    ______________________________ Elyse Jarvis, MD   ______________________________ Lacretia Leigh. Ninetta Lights, M.D.    MS/MEDQ  D:  09/13/2010  T:  09/13/2010  Job:  [id number]  Electronically Signed by Elyse Jarvis MD on 09/16/2010 06:23:37 PM Electronically Signed by Johny Sax M.D. on 09/27/2010 04:57:14 PM

## 2010-09-27 NOTE — Progress Notes (Signed)
Addended by: Youlanda Roys on: 09/27/2010 04:39 PM   Modules accepted: Orders

## 2010-09-27 NOTE — H&P (Deleted)
Hospital Admission Note Date: 09/27/2010  Patient name: [name] Medical record number: [id number] Date of birth: 12-05-1955 Age: 55 y.o. Gender: male PCP: Melida Quitter, MD  Medical Service: B 1  Attending physician:  Dr. Daiva Eves   Pager: Resident (R2/R3):  Denton Meek   Pager: [phone] Resident (R1):             Sahwney              Pager: [phone]  Chief Complaint: abdominal pain   History of Present Illness:patient is 55 year old male with history of alcohol abuse, chronic pancreatitis who presents to clinic with main concern of left-sided abdominal pain that started 4-5 days ago and is not getting better. Pain is described as sharp, 10 out of 10 in severity, radiating to the back, with no specific alleviating or aggravating factors. He reports similar episodes of pain in the past and acknowledges they weren't secondary to acute pancreatitis from his alcohol use. He He was discharged from the hospital on September 13, 2010 and reports not drinking since that time. His pain is associated with nausea and vomiting, unable to keep any food down, and reports chills. In addition she reports that area chest pain, intermittent, also 10 out of 10 in severity and radiating to the back. He reports not being able to lay down due to pain. He denies fevers, shortness of breath, palpitations, dizziness, weakness.      No current facility-administered medications for this visit.   No current outpatient prescriptions on file.    Allergies: Review of patient's allergies indicates no known allergies.  Past Medical History  Diagnosis Date  . Pancytopenia      chronic  . Polysubstance abuse   . Chronic kidney disease, stage IV (severe)   . Malignant hypertension   . Hepatitis C   . COPD (chronic obstructive pulmonary disease)   . Acute pancreatitis   . Smoker     No past surgical history on file.  No family history on file.  History   Social History  . Marital Status: Divorced   Spouse Name: N/A    Number of Children: N/A  . Years of Education: N/A   Occupational History  . Not on file.   Social History Main Topics  . Smoking status: Current Everyday Smoker -- 0.5 packs/day for 15 years    Types: Cigarettes  . Smokeless tobacco: Not on file  . Alcohol Use: Yes  . Drug Use: Yes  . Sexually Active:       unemployed divorced , used to drink one bottle of whiskey for years.    Other Topics Concern  . Not on file   Social History Narrative  . No narrative on file    Review of Systems: Pertinent items are noted in HPI.  Physical Exam: General: alert and oriented x3; in moderate distress due to abdominal pain; appears older than his age. HEENT:  Eyes: EOMI and PERRLA bilaterally; no icterus or conjunctival inection bilaterally. Nose: no discharge; Oropharynx: poor dentition; dry buccal mucosa. Neck: supple; no masses Lungs: CTA bilaterally Cor: RRR without M, R Abdomen: BS+; volunatry guarding present; LUQ TTP; muprhy sign neg; no rebound. PV: no edmea or cyanoses bilatearally; pedal pulses 2+/4 bilateraly Skin:  Pale; no rashes Neuro: Non-focal exam MSK: FROM of all joints without any erythema or effusion bilaterally. Psych: appropriate; memory intact to recent and remote.  Lab results:  Imaging results:   Other results:  Assessment & Plan by Problem: 1.  Left sided abdominal pain: Differential Dx's include but not limited to: Acute on chronic pancreatitis; gastritis; splenomegaly/splenic infarct; LLL PNA, nephrolithiasis, gastric cancer, lymphoma. Patient has a history of multiple admissions for a pnacreatitis flare. It appears most likely this is pancreatitis. Will admit to telemetry bed, Will check CBC, CMET, lipase, serum magnesium, phosphorus levels, serum alcohol and serum illicit drug levels, TGL and serum LDH. Will start him on morphine and zofran for pain and nausea. Will also start him on creon for his pancreatitis. He is NPO for bowel  rest, will advance his diet slowly as tolerated. Morphine for pain control. 2. Alcohol Abuse; CIWA protocol. Start him on thiamine and folate. Councel for alcohol cessastion . 3. ESRD on HD: Renal on board. HD tomorrow. 4. Anxiety; will treat him symptomatically with PRN ativan. 5. COPD: stable. Continue with the inhalers. 6 HTN, hx of: Will hold BP meds for now. His home med is nicardipine 7. Abnormal EKG; questionable T wave abnormalities in multiple leads. Will cycle CE and repeat EKG in the morning. Will observe him on telemetry overnight.

## 2010-09-27 NOTE — Assessment & Plan Note (Signed)
Patient is followed by [facility] and is having hemodialysis on Monday Wednesday and Friday. He reports compliance with his hemodialysis treatment. We will obtain electrolyte panel today.

## 2010-09-27 NOTE — Progress Notes (Signed)
  Subjective:    Patient ID: LAWRANCE WIEDEMANN, male    DOB: September 25, 1955, 55 y.o.   MRN: [id number]  HPI  patient is 55 year old male with history of alcohol abuse, chronic pancreatitis who presents to clinic with main concern of left-sided abdominal pain that started 4-5 days ago and is not getting better. Pain is described as sharp, 10 out of 10 in severity, radiating to the back, with no specific alleviating or aggravating factors. He reports similar episodes of pain in the past and acknowledges they weren't secondary to acute pancreatitis from his alcohol use. He  He was discharged from the hospital on September 13, 2010 and reports not drinking since that time. His pain is associated with nausea and vomiting, unable to keep any food down, and reports chills. In addition she reports that area chest pain, intermittent, also 10 out of 10 in severity and radiating to the back. He reports not being able to lay down due to pain. He denies fevers, shortness of breath, palpitations, dizziness, weakness.   Review of Systems  Constitutional: Positive for chills and fatigue. Negative for fever, diaphoresis, activity change, appetite change and unexpected weight change.  HENT: Negative.   Eyes: Negative.   Respiratory: Negative.   Cardiovascular: Positive for chest pain. Negative for palpitations and leg swelling.  Gastrointestinal:       Pain, radiating to the back  Musculoskeletal: Negative.   Psychiatric/Behavioral: Negative.        Objective:   Physical Exam  Constitutional: He is oriented to person, place, and time. He appears cachectic. He has a sickly appearance. He does not appear ill. No distress.  HENT:  Head: Atraumatic.  Mouth/Throat: Uvula is midline. Mucous membranes are not pale and dry. Abnormal dentition. Dental caries present. No oropharyngeal exudate, posterior oropharyngeal edema or posterior oropharyngeal erythema.  Neck: Normal range of motion and full passive range of motion  without pain. Neck supple. Normal carotid pulses, no hepatojugular reflux and no JVD present. No spinous process tenderness and no muscular tenderness present. Carotid bruit is not present. No rigidity. No edema, no erythema and normal range of motion present. No mass and no thyromegaly present.  Cardiovascular: Normal rate, regular rhythm, S1 normal, S2 normal, normal heart sounds, intact distal pulses and normal pulses.  PMI is not displaced.  Exam reveals no distant heart sounds and no friction rub.        Chest tenderness  Pulmonary/Chest: Effort normal and breath sounds normal. No respiratory distress. He has no wheezes. He has no rales. He exhibits no tenderness.  Abdominal: Normal appearance. He exhibits distension. He exhibits no shifting dullness, no pulsatile liver, no fluid wave, no abdominal bruit, no ascites, no pulsatile midline mass and no mass. Bowel sounds are decreased. There is tenderness in the epigastric area, periumbilical area, left upper quadrant and left lower quadrant. There is rigidity, guarding and CVA tenderness. There is no rebound, no tenderness at McBurney's point and negative Murphy's sign.  Musculoskeletal: He exhibits no edema.  Neurological: He is alert and oriented to person, place, and time. He has normal reflexes. No cranial nerve deficit.  Skin: Skin is warm and dry.  Psychiatric: He has a normal mood and affect. His behavior is normal. Judgment and thought content normal.          Assessment & Plan:

## 2010-09-27 NOTE — H&P (Signed)
HPI  patient is 55 year old male with history of alcohol abuse, chronic pancreatitis who presents to clinic with main concern of left-sided abdominal pain that started 4-5 days ago and is not getting better. Pain is described as sharp, 10 out of 10 in severity, radiating to the back, with no specific alleviating or aggravating factors. He reports similar episodes of pain in the past and acknowledges they weren't secondary to acute pancreatitis from his alcohol use. He He was discharged from the hospital on September 13, 2010 and reports not drinking since that time. His pain is associated with nausea and vomiting, unable to keep any food down, and reports chills. In addition she reports that area chest pain, intermittent, also 10 out of 10 in severity and radiating to the back. He reports not being able to lay down due to pain. He denies fevers, shortness of breath, palpitations, dizziness, weakness.     Review of Systems  Constitutional: Positive for chills and fatigue. Negative for fever, diaphoresis, activity change, appetite change and unexpected weight change.  HENT: Negative.  Eyes: Negative.  Respiratory: Negative.  Cardiovascular: Positive for chest pain. Negative for palpitations and leg swelling.  Gastrointestinal:  Pain, radiating to the back  Musculoskeletal: Negative.  Psychiatric/Behavioral: Negative.    Objective:   Physical Exam  Constitutional: He is oriented to person, place, and time. He appears cachectic. He has a sickly appearance. He does not appear ill. No distress.  HENT:  Head: Atraumatic.  Mouth/Throat: Uvula is midline. Mucous membranes are not pale and dry. Abnormal dentition. Dental caries present. No oropharyngeal exudate, posterior oropharyngeal edema or posterior oropharyngeal erythema.  Neck: Normal range of motion and full passive range of motion without pain. Neck supple. Normal carotid pulses, no hepatojugular reflux and no JVD present. No spinous process  tenderness and no muscular tenderness present. Carotid bruit is not present. No rigidity. No edema, no erythema and normal range of motion present. No mass and no thyromegaly present.  Cardiovascular: Normal rate, regular rhythm, S1 normal, S2 normal, normal heart sounds, intact distal pulses and normal pulses. PMI is not displaced. Exam reveals no distant heart sounds and no friction rub.  Pulmonary/Chest: Chest wall TTP noted;  Effort normal and breath sounds normal. No respiratory distress. He has no wheezes. He has no rales. He exhibits no tenderness.  Abdominal: Normal appearance. He exhibits distension. He exhibits no shifting dullness, no pulsatile liver, no fluid wave, no abdominal bruit, no ascites, no pulsatile midline mass and no mass. Bowel sounds are decreased. There is tenderness in the epigastric area, periumbilical area, left upper quadrant and left lower quadrant. There is rigidity, guarding and CVA tenderness. There is no rebound, no tenderness at McBurney's point and negative Murphy's sign.  Musculoskeletal: He exhibits no edema.  Neurological: He is alert and oriented to person, place, and time. He has normal reflexes. No cranial nerve deficit.  Skin: Skin is warm and dry.  Psychiatric: He has a normal mood and affect. His behavior is normal. Judgment and thought content normal.   A/P: 1.  Left sided abdominal pain: Differential Dx's include but not limited to: Acute on chronic pancreatitis; gastritis; splenomegaly/splenic infarct; LLL PNA, nephrolithiasis, gastric cancer, lymphoma. Patient has a history of multiple admissions for a pnacreatitis flare. It appears most likely this is pancreatitis. Will admit to telemetry bed, Will check CBC, CMET, lipase, serum magnesium, phosphorus levels, serum alcohol and serum illicit drug levels, TGL and serum LDH. Will start him on morphine and zofran for  pain and nausea. Will also start him on creon for his pancreatitis. He is NPO for bowel  rest, will advance his diet slowly as tolerated. Morphine for pain control. 2. Alcohol Abuse; CIWA protocol. Start him on thiamine and folate. Councel for alcohol cessastion . 3. ESRD on HD: Renal on board. HD tomorrow. 4. Anxiety; will treat him symptomatically with PRN ativan. 5. COPD: stable. Continue with the inhalers. 6 HTN, hx of: Will hold BP meds for now. His home med is nicardipine 7. Abnormal EKG; questionable T wave abnormalities in multiple leads. Will cycle CE and repeat EKG in the morning. Will observe him on telemetry overnight.

## 2010-09-27 NOTE — Assessment & Plan Note (Signed)
We will check urine drug screen and alcohol levels.

## 2010-09-27 NOTE — Assessment & Plan Note (Signed)
On physical exam patient has significant left upper and lower quadrant abdominal pain. Given his history of alcohol abuse this is most worrisome for acute pancreatitis.  I have discussed this case with Dr. Phillips Odor. We will check complete metabolic panel, lipase, alcohol levels, and urine drug screen. Patient will need admission for further evaluation and management.

## 2010-09-27 NOTE — Assessment & Plan Note (Signed)
Uncontrolled hypertension, most likely secondary to acute pancreatitis, in combination with medical noncompliance. The patient will need admission for an acute pancreatitis management, pain control and blood pressure control.

## 2010-09-28 ENCOUNTER — Inpatient Hospital Stay (HOSPITAL_COMMUNITY): Payer: Medicare Other

## 2010-09-28 ENCOUNTER — Encounter: Payer: Self-pay | Admitting: Internal Medicine

## 2010-09-28 ENCOUNTER — Encounter: Payer: Medicare Other | Admitting: Internal Medicine

## 2010-09-28 DIAGNOSIS — R109 Unspecified abdominal pain: Secondary | ICD-10-CM

## 2010-09-28 DIAGNOSIS — F1010 Alcohol abuse, uncomplicated: Secondary | ICD-10-CM | POA: Insufficient documentation

## 2010-09-28 LAB — BASIC METABOLIC PANEL
CO2: 19 mEq/L (ref 19–32)
GFR calc Af Amer: 6 mL/min — ABNORMAL LOW (ref >60)
Glucose, Bld: 83 mg/dL (ref 70–99)
Potassium: 6 mEq/L — ABNORMAL HIGH (ref 3.5–5.1)
Sodium: 138 mEq/L (ref 135–145)

## 2010-09-28 LAB — BODY FLUID CELL COUNT WITH DIFFERENTIAL
Lymphs, Fluid: 20 %
Monocyte-Macrophage-Serous Fluid: 18 % — ABNORMAL LOW (ref 50–90)
Neutrophil Count, Fluid: 62 % — ABNORMAL HIGH (ref 0–25)
Total Nucleated Cell Count, Fluid: 2807 cu mm — ABNORMAL HIGH (ref 0–1000)

## 2010-09-28 LAB — POTASSIUM: Potassium: 3.4 mEq/L — ABNORMAL LOW (ref 3.5–5.1)

## 2010-09-28 LAB — CBC
HCT: 26.7 % — ABNORMAL LOW (ref 39.0–52.0)
Hemoglobin: 8.4 g/dL — ABNORMAL LOW (ref 13.0–17.0)
RBC: 2.86 MIL/uL — ABNORMAL LOW (ref 4.22–5.81)
WBC: 2.3 10*3/uL — ABNORMAL LOW (ref 4.0–10.5)

## 2010-09-28 LAB — CARDIAC PANEL(CRET KIN+CKTOT+MB+TROPI)
CK, MB: 11.6 ng/mL (ref 0.3–4.0)
Relative Index: 6.3 — ABNORMAL HIGH (ref 0.0–2.5)
Relative Index: 5.9 — ABNORMAL HIGH (ref 0.0–2.5)
Total CK: 195 U/L (ref 7–232)

## 2010-09-28 LAB — PROTEIN, BODY FLUID: Total protein, fluid: 5.4 g/dL

## 2010-09-28 LAB — PROCALCITONIN: Procalcitonin: 0.32 ng/mL

## 2010-09-28 LAB — GLUCOSE, SEROUS FLUID: Glucose, Fluid: 70 mg/dL

## 2010-09-28 MED ORDER — IOHEXOL 300 MG/ML  SOLN
80.0000 mL | Freq: Once | INTRAMUSCULAR | Status: AC | PRN
  Administered 2010-09-28: 80 mL via INTRAVENOUS

## 2010-09-29 DIAGNOSIS — R079 Chest pain, unspecified: Secondary | ICD-10-CM

## 2010-09-29 LAB — CARDIAC PANEL(CRET KIN+CKTOT+MB+TROPI)
CK, MB: 10.6 ng/mL (ref 0.3–4.0)
Relative Index: 5.5 — ABNORMAL HIGH (ref 0.0–2.5)
Relative Index: 6 — ABNORMAL HIGH (ref 0.0–2.5)
Troponin I: 0.03 ng/mL (ref 0.00–0.06)

## 2010-09-29 LAB — BASIC METABOLIC PANEL
BUN: 14 mg/dL (ref 6–23)
CO2: 25 mEq/L (ref 19–32)
Calcium: 8.7 mg/dL (ref 8.4–10.5)
Glucose, Bld: 75 mg/dL (ref 70–99)
Sodium: 135 mEq/L (ref 135–145)

## 2010-09-29 LAB — CBC
HCT: 29.1 % — ABNORMAL LOW (ref 39.0–52.0)
Hemoglobin: 9 g/dL — ABNORMAL LOW (ref 13.0–17.0)
MCH: 28.3 pg (ref 26.0–34.0)
MCHC: 30.9 g/dL (ref 30.0–36.0)
MCV: 91.5 fL (ref 78.0–100.0)

## 2010-09-29 LAB — PH, BODY FLUID: pH, Fluid: 8

## 2010-09-29 NOTE — Op Note (Signed)
NAMEGILMAN, OLAZABAL               ACCOUNT NO.:  [id number]  MEDICAL RECORD NO.:  [id number]           PATIENT TYPE:  O  LOCATION:  [facility]                         FACILITY:  [facility]  PHYSICIAN:  Fransisco Hertz, MD       DATE OF BIRTH:  1955/10/08  DATE OF PROCEDURE:  09/26/2010 DATE OF DISCHARGE:  09/26/2010                              OPERATIVE REPORT   PROCEDURE: 1. Right internal jugular vein cannulation under ultrasound guidance. 2. Right internal jugular vein tunneled dialysis catheter placement.  PREOPERATIVE DIAGNOSIS:  Thrombosed left forearm arteriovenous graft, hyperkalemia, possible early alcohol withdrawal.  POSTOPERATIVE DIAGNOSIS:  Thrombosed left forearm arteriovenous graft, hyperkalemia, possible early alcohol withdrawal.  SURGEON:  Fransisco Hertz, MD.  ANESTHESIA:  Monitored anesthesia care and local.  FINDINGS:  Tips of the tunneled dialysis catheter in the right atrium.  SPECIMENS:  None.  ESTIMATED BLOOD LOSS:  Minimal.  INDICATIONS:  This is a 55 year old gentleman with a left forearm arteriovenous graft that previously has had a graft placement in the basilic vein outflow.  He only recently clotted off his graft.  As this patient currently has a potassium of 5.9 and it appears that he is exhibiting signs of possible early alcohol withdrawal, I did not feel that it was safe to proceed forward with a thrombectomy given that there is already a stent in the outflow tract, this graft is doomed to failure, so I felt at this point that the best route of action would be to allow him to have a tunneled dialysis catheter placed and then dialyze him and then later on have his graft revised to a brachial to axillary graft.  I discussed this plan with the patient.  He is fine with it.  He is aware of the risks of this procedure including bleeding, infection, possible central venous injury, possible pneumothorax.  He is aware of these risks and agreed to proceed forward  with such.  DESCRIPTION OF THE OPERATION:  After full informed written consent was obtained, the patient was brought back to the operating room, placed supine upon the operating table.  Prior to induction, he had received IV antibiotics.  After obtaining adequate sedation, he was then prepped and draped in the standard fashion for bilateral either neck or chest tunneled dialysis catheter placement.  I turned my attention first to the right neck.  Under ultrasound guidance, I identified the right internal jugular vein which was quite distended.  After injecting a total of about 30 mL of one-to-one mix of 0.5% Marcaine without epinephrine and 1% lidocaine with epinephrine into the neck and also the subcutaneous tract for the tunnel, I then cannulated the right internal jugular vein with an 18 gauge needle and passed the wire down into the inferior vena cava.  This was verified under fluoroscopy.  I then clamped the wire to the drapes.  I then made incisions at the neck incision and at the exit site on the chest.  I then dissected using a [employer].  A subcutaneous tract from the neck to the chest and then on using the metal tunneler tunneled from  the chest to the neck in the previously dissected tract.  I then unclamped the wire and removed the needle at this point.  Under fluoroscopic guidance, then I placed serial dilators to dilate up the subcutaneous tract and also the venotomy.  Eventually I placed a peel-away sheath with dilator over the wire down into the superior vena cava.  The wire was then removed with the dilator and I loaded a 23-cm Diatek catheter through the sheath and then broke the sheath, peeled it away taking care to keep the cuff of this catheter at skin level.  The back end of this catheter was then connected to the metal tunneler and delivered through the subcutaneous tunnel in a retrograde fashion.  I then pulled back the catheter, so the cuff was within 1 cm of  the exit site.  I checked the position of the catheter under fluoroscopic guidance.  It was noted it to be in the right atrium.  At this point, I transected the back end of this catheter revealing the two lumens.  The two ports were then docked onto these two lumens and then the catheter hub was screwed into place.  I then tested each port by aspirating and flushing without any resistance noted in this process.  I then loaded each port with heparinized saline.  At this point,  the catheter was secured in place with two interrupted stitches of 3-0 nylon,  tied to the catheter.  Then the neck incision was closed with a U stitch of 4-0 Monocryl.   At this point, the skin was cleaned, dried, and sterile bandage were applied to all incisions.  The each port was loaded with concentrated heparin at 1000 units/mL concentration at the manufacturer recommended volumes to each port and then each port was sterilely capped.  Complications were none.    Condition was stable.     Fransisco Hertz, MD     BLC/MEDQ  D:  09/26/2010  T:  09/27/2010  Job:  [id number]  Electronically Signed by Leonides Sake MD on 09/29/2010 04:10:58 PM

## 2010-09-29 NOTE — Consult Note (Signed)
[name], [name] NO.:  [id number]  MEDICAL RECORD NO.:  [id number]           PATIENT TYPE:  O  LOCATION:  [facility]                         FACILITY:  [facility]  PHYSICIAN:  Jonelle Sidle, MD DATE OF BIRTH:  April 01, 1956  DATE OF CONSULTATION: DATE OF DISCHARGE:  09/26/2010                                CONSULTATION   REQUESTING PHYSICIAN:  Acey Lav, MD  PRIMARY CARE PHYSICIAN:  [facility]  REASON FOR CONSULTATION:  Chest pain.  HISTORY OF PRESENT ILLNESS:  [name] is a medically complex 55 year old male with a history of stage IV chronic kidney disease, hypertension, hepatitis C, COPD, and polysubstance abuse including alcohol dependence status post recent inpatient treatment, history of depression with suicidal ideation, and recent hospitalization in early February with acute alcohol withdrawal, ventilatory-dependent respiratory failure with sepsis and acute pancreatitis.  He is now admitted to the hospital presenting with complaints of left-sided abdominal and flank discomfort described as a sharp sensation, and associated with nausea, emesis, and anorexia.  He also states that he has been having some left-sided chest discomfort, specifically when he takes a deep breath and to use his Spiriva.  He reports a very sharp sensation when this occurs, otherwise it is nonexertional.  Cardiac markers were sent by the primary team with findings of normal troponin-I levels, peak of 0.04, and elevated total CK-MB up to 13.6 with a relative index of 6.3 (actual total CK was normal, however).  Review of ECG finds sinus rhythm with increased voltage and probable early repolarization.  No obvious acute ST-segment changes are noted.  ALLERGIES:  No known drug allergies.  HOME MEDICATIONS: 1. Calcium acetate 667 mg p.o. t.i.d. 2. Colchicine 0.6 mg p.o. daily. 3. Folic acid 1 mg p.o. daily. 4. Protonix 40 mg p.o. daily. 5. Renal vitamin  one p.o. daily. 6. Spiriva 18 mcg 1 inhalation daily. 7. Thiamine 100 mg p.o. daily. 8. Ventolin inhalers 2 puffs q.4 h. p.r.n.  PRESENT MEDICATIONS: 1. Pancrease 3 capsules p.o. t.i.d. 2. Aspirin 325 mg p.o. daily. 3. PhosLo 667 mg p.o. t.i.d. 4. Coreg 3.125 mg p.o. b.i.d. 5. Aranesp 60 mcg subcu on Mondays. 6. Lovenox 30 mg subcu at bedtime. 7. Folic acid 1 mg p.o. daily. 8. Gentamicin 120 mg IV with dialysis. 9. Imipenem 250 mg IV q.12 h. 10.Zestril 2.5 mg p.o. daily. 11.Ativan 4 mg p.o. q.6 h. 12.Multivitamin 1 p.o. daily. 13.Protonix 40 mg IV daily. 14.Zemplar 2 mcg IV on Monday, Wednesday, and Friday with dialysis. 15.Thiamine 100 mg p.o. daily. 16.Spiriva 18 mcg 1 inhalation daily. 17.Vancomycin 750 mg IV Monday, Wednesday, and Friday with dialysis.  PAST MEDICAL HISTORY:  As discussed above.  Additional problems include, Documented pancytopenia. Peptic ulcer disease with history of hematemesis and Mallory-Weiss tear. Multiple psychiatric evaluations with history of alcohol dependence. Depression and suicidal ideation. Recent placement of right IJ dialysis catheter September 26, 2010, thrombectomy and revision of left forearm AV fistula in December 2011. EGD with  evidence of linear ulcer in the mid esophagus with gastroenteritis and duodenitis in October 2011, multiple other access procedures as documented  in the electronic medical record. Echocardiogram noted from April 2008 demonstrating left ventricular  ejection fraction of 65% without regional wall motion abnormalities, no major valvular abnormalities, no pericardial effusion.  SOCIAL HISTORY:  The patient is divorced, lives alone, presently unemployed.  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  FAMILY HISTORY:  No reported premature cardiovascular disease. Significant for substance use.  Both parents are  deceased.  REVIEW OF SYSTEMS:  As outlined above.  No palpitations or syncope.  No reported hematemesis or melena.  He reports poor appetite.  No fevers, but reported chills.  No progressive shortness of breath or cough.  No hemoptysis.  Otherwise reviewed and negative.  PHYSICAL EXAMINATION:  VITAL SIGNS:  Temperature is 98.7 degrees, heart rate is in the 90s in sinus rhythm by telemetry, respirations 20, blood pressure is 152/87, oxygen saturation is 94% on room air, and weight is 60 kg. GENERAL:  This is a thin male, appears chronically ill, no acute distress without active chest pain. HEENT:  Conjunctiva and lids normal, oropharynx is clear with poor dentition. NECK:  Supple.  No elevated JVP or carotid bruits.  No thyromegaly. LUNGS:  Exhibit clear, but diminished breath sounds throughout with decreased breath sounds at the bases left greater than right.  No egophony. CARDIAC:  A regular rate and rhythm with soft S4.  No obvious pericardial rub or S3 gallop.  No significant systolic murmur. ABDOMEN:  Soft, mildly tender to palpation, but no guarding or rebound. Bowel sounds were present. SKIN:  Pale, otherwise warm and dry.  No rashes. EXTREMITIES:  Exhibit no pitting edema.  Distal pulses are 1+. MUSCULOSKELETAL:  No kyphosis noted. NEUROPSYCHIATRIC:  The patient is alert and oriented x3, the patient is calm.  LABORATORY DATA:  WBC is 2.9, hemoglobin 9.0, hematocrit 29.1, and platelets 371.  Sodium 135, potassium 4.7, chloride 100, bicarb 25, glucose 75, BUN 14, creatinine 4.9, calcium 8.7, alcohol level less than 5.  MRSA PCR negative.  Blood cultures negative from September 28, 2010.  IMAGING DATA:  Chest x-ray from September 28, 2010, initially showed significant left pleural effusion with subsequent ultrasound-guided thoracentesis yielding 880 mL fluid, presently undergoing analysis. Followup chest film showed improvement in effusion with no pneumothorax and small patchy  infiltrate or fluid loculation in the mid lung zone that was unchanged.  IMPRESSION: 1. Atypical left-sided chest pain, reported pleuritic in description,     specifically worse when he takes in a deep breath to use his     Spiriva.  No clear exertional component.  Electrocardiogram is     nonspecific showing left ventricular hypertrophy, but no acute ST-     segment changes, and his troponin levels are normal despite an     increase in CK-MB with normal total CK level.  It seems unlikely     that this represents an acute coronary syndrome. 2. Presentation with nausea, emesis, left-sided abdominal and flank     pain, recent episode of acute pancreatitis, presently being treated     with empiric broad-spectrum antibiotic therapy, and status post     thoracentesis of a significant left-sided pleural effusion with     analysis ongoing. 3. End-stage renal disease on hemodialysis, Monday, Wednesday, and     Friday. 4. History of alcohol dependence with recurrent relapse. 5. History of polysubstance abuse as noted above. 6. Hypertension. 7. History of chronic obstructive pulmonary disease  by report with     tobacco use about half pack per day.  RECOMMENDATIONS:  Suggest follow up ECG in the morning, and we will arrange a 2-D echocardiogram to exclude pericardial effusion and reassess left ventricular systolic function.  Otherwise, treat medically for blood pressure control and general risk factor modification, while addressing other acute medical issues.  If echocardiogram is reassuring, I doubt that other ischemic workup will be necessary at this time.     Jonelle Sidle, MD     SGM/MEDQ  D:  09/29/2010  T:  09/29/2010  Job:  [id number]  cc:   Acey Lav, MD  Electronically Signed by Nona Dell MD on 09/29/2010 08:13:16 PM

## 2010-09-30 DIAGNOSIS — R109 Unspecified abdominal pain: Secondary | ICD-10-CM

## 2010-09-30 LAB — PHOSPHORUS: Phosphorus: 6.9 mg/dL — ABNORMAL HIGH (ref 2.3–4.6)

## 2010-09-30 LAB — CBC
HCT: 28.3 % — ABNORMAL LOW (ref 39.0–52.0)
Hemoglobin: 9.1 g/dL — ABNORMAL LOW (ref 13.0–17.0)
MCH: 29.2 pg (ref 26.0–34.0)
MCHC: 32.2 g/dL (ref 30.0–36.0)
MCV: 90.7 fL (ref 78.0–100.0)
Platelets: 401 K/uL — ABNORMAL HIGH (ref 150–400)
RBC: 3.12 MIL/uL — ABNORMAL LOW (ref 4.22–5.81)
RDW: 16 % — ABNORMAL HIGH (ref 11.5–15.5)
WBC: 3.8 K/uL — ABNORMAL LOW (ref 4.0–10.5)

## 2010-09-30 LAB — MAGNESIUM: Magnesium: 2.2 mg/dL (ref 1.5–2.5)

## 2010-09-30 LAB — BASIC METABOLIC PANEL
Calcium: 9 mg/dL (ref 8.4–10.5)
GFR calc Af Amer: 10 mL/min — ABNORMAL LOW (ref >60)
GFR calc non Af Amer: 8 mL/min — ABNORMAL LOW (ref >60)
Sodium: 130 mEq/L — ABNORMAL LOW (ref 135–145)

## 2010-10-01 DIAGNOSIS — I059 Rheumatic mitral valve disease, unspecified: Secondary | ICD-10-CM

## 2010-10-01 LAB — RENAL FUNCTION PANEL
Albumin: 2.4 g/dL — ABNORMAL LOW (ref 3.5–5.2)
BUN: 33 mg/dL — ABNORMAL HIGH (ref 6–23)
GFR calc Af Amer: 8 mL/min — ABNORMAL LOW (ref >60)
Phosphorus: 7 mg/dL — ABNORMAL HIGH (ref 2.3–4.6)
Potassium: 5.4 mEq/L — ABNORMAL HIGH (ref 3.5–5.1)
Sodium: 128 mEq/L — ABNORMAL LOW (ref 135–145)

## 2010-10-01 LAB — CBC
Platelets: 311 10*3/uL (ref 150–400)
RDW: 15.8 % — ABNORMAL HIGH (ref 11.5–15.5)
WBC: 3.1 10*3/uL — ABNORMAL LOW (ref 4.0–10.5)

## 2010-10-02 ENCOUNTER — Inpatient Hospital Stay (HOSPITAL_COMMUNITY): Payer: Medicare Other

## 2010-10-02 LAB — CBC
MCV: 91.2 fL (ref 78.0–100.0)
Platelets: 302 10*3/uL (ref 150–400)
RDW: 15.9 % — ABNORMAL HIGH (ref 11.5–15.5)
WBC: 3.5 10*3/uL — ABNORMAL LOW (ref 4.0–10.5)

## 2010-10-02 LAB — BASIC METABOLIC PANEL
BUN: 39 mg/dL — ABNORMAL HIGH (ref 6–23)
GFR calc non Af Amer: 6 mL/min — ABNORMAL LOW (ref >60)
Potassium: 5.2 mEq/L — ABNORMAL HIGH (ref 3.5–5.1)

## 2010-10-02 LAB — ALBUMIN: Albumin: 2.2 g/dL — ABNORMAL LOW (ref 3.5–5.2)

## 2010-10-02 LAB — DRUG SCREEN PANEL (SERUM)

## 2010-10-03 ENCOUNTER — Telehealth: Payer: Self-pay | Admitting: *Deleted

## 2010-10-03 LAB — HCV RNA QUANT RFLX ULTRA OR GENOTYP: HCV Quantitative Log: 4.83 {Log} — ABNORMAL HIGH (ref <1.63)

## 2010-10-03 NOTE — Telephone Encounter (Signed)
The new and correct prescription was called at [facility] pharmacy.

## 2010-10-03 NOTE — Telephone Encounter (Signed)
[facility] called and stated Creon 10 has been discontinued but they do have the Creon 12,000. Thanks

## 2010-10-04 LAB — CULTURE, BLOOD (ROUTINE X 2)
Culture  Setup Time: 201202242333
Culture  Setup Time: 201202242333

## 2010-10-08 NOTE — H&P (Signed)
[name], [name]               ACCOUNT NO.:  [id number]  MEDICAL RECORD NO.:  [id number]          PATIENT TYPE:  IPS  LOCATION:  0407                          FACILITY:  [facility]  PHYSICIAN:  Landry Corporal, N.P.    DATE OF BIRTH:  Apr 26, 1956  DATE OF ADMISSION:  08/20/2010 DATE OF DISCHARGE:                      PSYCHIATRIC ADMISSION ASSESSMENT   HISTORY OF PRESENT ILLNESS:  This is a 55 year old male admitted on August 20, 2010.  HISTORY OF PRESENT ILLNESS:  The patient is a transfer from the Medical Unit after the patient was admitted with a history of alcohol abuse, hypertension and abnormal kidney function.  The patient is on dialysis, has not been going for his dialysis and instead is drinking, reporting drinking up to half a gallon of vodka.  He had suicidal plans as well to cut his wrists.  He was experiencing some psychotic symptoms such as seeing spiders and bugs crawling on the walls and is here for further assessment of his alcohol abuse.  PAST PSYCHIATRIC HISTORY:  The patient has had several other admissions to [facility].  Last noted admission was in June of 2010.  No current outpatient mental health treatment.  SOCIAL HISTORY:  The patient is unemployed and divorced.  Lives alone.  FAMILY HISTORY:  None.  X-RAYS:  The patient reports again his history of drinking up to half a gallon of vodka states he buys that much so he does not have to go to the store.  Denies any other substance use.  Denies any seizure history.  PRIMARY CARE PROVIDER:  [facility].  MEDICAL PROBLEMS: 1. Acute pancreatitis. 2. End-stage renal disease on hemodialysis. 3. History of malignant hypertension. 4. Chronic obstructive pulmonary disease. 5. History of pancytopenia. 6. History of hepatitis C. 7. History of peptic ulcer disease and history of hematemesis likely     secondary to a Mallory-Weiss tear.  MEDICATIONS:  The patient was discharged to the  [facility] on: 1. Rena-Vite tablets 1 daily. 2. Calcium acetate 667 mg capsule, taking 1 tablet t.i.d. 3. Spiriva 18 mcg daily. 4. Ventolin inhaled 2 puffs q. 4-6 hours as needed for shortness of     breath. 5. Folic acid 1 mg daily. 6. Thiamine 100 mg daily. 7. Omeprazole 40 mg 1 tablet daily and was managed on Ativan during     his stay.  PHYSICAL EXAM:  The patient appears as if he has lost weight.  His clothes are somewhat loose.  LABORATORY DATA:  Shows a sodium of 132, glucose of 122, BUN of 58, creatinine is 6.78, albumin is 2.7.  His CBC shows a hemoglobin of 9, hematocrit 26.8, platelet count 121, lipid profile is within normal limits.  Lipase is 54.  Hepatic function:  SGOT is elevated at 1131, ALT of 569.  Salicylate level is less than 4, acetaminophen level less than 10.  Urine drug screen was negative.  The patient presented with an alcohol level of 282.  MENTAL STATUS EXAM:  The patient is resting in bed.  He is fully alert, agreeable to the interview.  He has fair eye contact.  He appears very  sad and hopeless about his alcohol use but denies any suicidal thoughts. He is wanting to go to an outpatient facility afterwards to maintain sobriety.  His cognitive function is intact.  His judgment and insight appear fair.  Poor impulse control related to alcohol use.  AXIS I:  Alcohol dependence.  Alcohol withdrawal. AXIS II:  Deferred. AXIS III: History of malignant hypertension.   End-stage renal disease.   COPD.   History of pancytopenia.   Hepatitis C.   Peptic ulcer disease. AXIS IV: Medical problems, possible other psychosocial problems. AXIS V:  Current is 35.  PLAN:  Put patient on Librium protocol.  We will continue to monitor comorbidities, assess motivation for rehab and arrange dialysis visits for the patient during his stay.  TENTATIVE LENGTH OF STAY:  2-4 days.     Landry Corporal, N.P.     JO/MEDQ  D:  08/21/2010  T:   08/21/2010  Job:  [id number]  Electronically Signed by Limmie PatriciaP. on 08/28/2010 02:09:38 PM Electronically Signed by Geralyn Flash MD on 10/08/2010 11:01:17 AM

## 2010-10-09 ENCOUNTER — Ambulatory Visit (INDEPENDENT_AMBULATORY_CARE_PROVIDER_SITE_OTHER): Payer: Medicare Other | Admitting: Vascular Surgery

## 2010-10-09 DIAGNOSIS — N186 End stage renal disease: Secondary | ICD-10-CM

## 2010-10-10 NOTE — Assessment & Plan Note (Signed)
OFFICE VISIT  [name], [name] DOB:  [birth year]                                       10/09/2010 [id number]#:[id number]  This patient is a 55 year old male known to our practice with end-stage renal disease.  He has had previous vascular access in both upper extremities.  He has a right forearm AV fistula which had to be ligated for steal syndrome the following day.  He had a left  forearm AV graft placed in [place] which occluded shortly thereafter and after 1 revision, it again reoccluded 1 month later.  This was performed in December of 2011.  His veins have been mapped; he is not a candidate for AV fistula in either upper extremity.  He now presents with no symptoms of steal in either upper extremity for vascular access.  He is right- handed.  CHRONIC MEDICAL PROBLEMS: 1. End stage renal disease on  hemodialysis Monday, Wednesday, Friday. 2. COPD. 3. Hypertension, not currently on medication. 4. History of gout. 5. Negative for stroke, diabetes, coronary artery disease.  FAMILY HISTORY:  Positive for hypertension in both parents and a brother.  Negative for coronary artery disease, diabetes and stroke.  SOCIAL HISTORY:  Single.  Smokes half pack of cigarettes per day; he is trying to quit.  He does use alcohol/.  REVIEW OF SYSTEMS:  Negative for chest pain.  Does have occasional dyspnea on exertion, bronchitis, chronic kidney disease, discomfort in his legs, headaches, joint pain, muscle pain.  PHYSICAL EXAMINATION:  Blood pressure 161/100, heart rate is 100, respirations 20.  General:  He is a thin, well-nourished male who is in no apparent distress, alert, oriented x3.  HEENT:  Exam is normal for age.  EOMs intact.  Lungs:  Clear to auscultation.  No rhonchi or wheezing.  Abdomen:  Soft, nontender with no masses.  Upper extremity exam:  Reveals 3+ brachial and radial pulses bilaterally.  There is a clotted left forearm graft with no evidence of  infection.  Right arm has a 2+ radial pulse.  I have reviewed  previous vascular lab studies including a vein mapping performed in January of 2011 and he is not a candidate for fistula.  We will schedule him for left upper arm AV graft to be inserted by Dr. Darrick Penna on Tuesday, 10/16/2010  at [facility] as an outpatient.  He realizes that steal syndrome could occur after insertion of his graft but he would like to proceed.    [name], M.D. Electronically Signed  JDL/MEDQ  D:  10/09/2010  T:  10/10/2010  Job:  [id number]

## 2010-10-11 NOTE — Discharge Summary (Signed)
[name], [name]               ACCOUNT NO.:  [id number]  MEDICAL RECORD NO.:  [id number]           PATIENT TYPE:  LOCATION:                                 FACILITY:  PHYSICIAN:  Acey Lav, MD  DATE OF BIRTH:  March 03, 1956  DATE OF ADMISSION: DATE OF DISCHARGE:                              DISCHARGE SUMMARY   DISCHARGE DIAGNOSES: 1. Left-sided abdominal pain likely secondary to left lower lobe     pneumonia versus chronic pancreatitis. 2. End-stage renal disease on hemodialysis. 3. Chronic obstructive pulmonary disease. 4. Hypertension. 5. Alcohol abuse. 6. Hepatitis C.  Discharge meds with accurate doses include: 1. Creon 10, take 3 capsules by mouth three times in the with meals. 2. Klonopin 0.5 mg, take 1 tablet by mouth twice daily for anxiety. 3. Levaquin 500 mg, take 1 tablet by mouth on Thursday and one on     Saturday. 4. Metronidazole 500 mg, take 1 tablet by mouth three times a day for     5 days. 5. Nutritional supplements 237 mL, take three times a day as needed. 6. Percocet 5/325, take 1 tablet by mouth every 6 hours as needed for     pain. 7. Calcium acetate 667 mg, take 1 capsule by mouth three times a day. 8. Colchicine 0.6 mg, take 1 tablet by mouth daily. 9. Folic acid 1 mg, take 1 tablet by mouth daily. 10.Protonix 40 mg, take 1 tablet by mouth daily. 11.Renal vitamin, take 1 tablet by mouth daily. 12.Spiriva 18 mcg, inhale 1 capsule by mouth daily. 13.Thiamine 100 mg, take 1 tablet by mouth daily. 14.Albuterol inhale 2 puffs every 4 hours as needed for shortness of     breath.  DISPOSITION AND FOLLOWUP:  The patient was discharged home from [facility] in stable and improved condition on October 02, 2010. The patient has been scheduled a followup appointment with Dr. Threasa Beards on October 25, 1998.  At that time, he needs to get a repeat chest x-ray to look for resolution of his pneumonia.  PROCEDURES PERFORMED: 1. Abdominal acute  series on September 27, 2010, which showed     nonspecific mild abdominal small bowel distention, possibly early     ileus obstruction.  No free air.  Moderate left pleural effusion. 2. Chest x-ray on September 28, 2010, which showed left pleural     effusion and basilar atelectasis or consolidation without change. 3. Chest x-ray lateral film which showed significant decrease in left     effusion.  No pneumothorax. 4. CT abdomen and pelvis on September 28, 2010, which showed normal CT     appearance of pancreas.  No CT evidence of pancreatitis or     pancreatic mass.  New small left pleural effusion and basilar     atelectasis.  Stable borderline splenomegaly.  Collateral vessels     again seen in the gastrohepatic and gastrosplenic vessels.     Pulmonary venous hypertension cannot be excluded.  Congenital     incomplete rotation of bowel.  No evidence of volvulus or bowel     obstruction. 5. Ultrasound-guided thoracentesis on September 28, 2010, which drained     about 880 mL of pleural fluid.  CONSULTATIONS:  Renal for hemodialysis.  BRIEF ADMITTING HISTORY AND PHYSICAL:  [name] is a 55 year old man with past medical history significant for alcohol abuse, hepatitis C, pancreatitis, who was a direct admit from [facility] for his left-sided abdominal pain.  The patient states his pain started 2-3 days ago.  It is located on the left side of the abdomen, severe in intensity, 10/10, with radiation to the back.  The patient also reports some nausea but denied any vomiting associated with it.  The patient also reports some subjective fevers but denies any chills.  He also reported some left-sided chest pain which was worse when he takes few deep breaths and was located on the left lower chest, but he denies any shortness of breath, palpitations.  The patient also denied any dysuria, urgency, frequency, or blood in his stools.  PHYSICAL EXAMINATION:  VITALS:  On admission  include temperature 98.3, pulse 82, respiratory rate 20, blood pressure 166/105, O2 sats 94% on room air. GENERAL:  In no acute distress, alert, oriented. LUNGS:  Clear to auscultation bilaterally. CVS:  Regular rate rhythm.  No murmurs, rubs, or gallops. ABDOMEN:  Soft, tender to palpation in the left upper quadrant, voluntary guarding, bowel sounds present, no hepatosplenomegaly. EXTREMITIES:  Pulses 2+ bilaterally. NEUROLOGIC:  Alert and oriented x3, cranial nerves II through XII intact, sensation intact, motor strength 5/5 bilaterally in all four extremities.  HOSPITAL COURSE BY PROBLEM: 1. Left-sided abdominal pain.  For his left-sided abdominal pain, it     was thought that the likely etiology was his chronic pancreatitis     versus left lower lobe pneumonia.  The patient was noticed having     new left-sided pleural effusion and his lipase levels were checked     which were 60.  The patient was recently discharged from the     hospital on September 13, 2010, so he was treated for his left-sided     pleural effusion as hospital-acquired pneumonia.  His abdominal     pain continued to improve throughout his hospital stay.  The     patient was also made initially n.p.o. thinking of chronic     pancreatitis and diet was slowly advanced which he tolerated very     well.  The patient was also started on Creon which helped with his     abdominal pain. 2. Hospital-acquired pneumonia.  The patient was noticed new left-     sided pleural effusion, and because he was recently discharged from     the hospital he was treated for hospital-acquired pneumonia with     vanc, imipenem, and gentamicin.  His therapy was de-escalated     throughout his hospital stay. Aspiration pneumonia was also one of the      differential given his history of alcohol abuse and the location of     his pleural effusion.  The patient was finally discharged home on     Levaquin and Flagyl.  The patient also underwent  ultrasound-guided     thoracentesis and the results of the pleural fluid analysis were     consistent with an exudate which was thought to be likely     infectious in origin. He had some low grade fever but responded very      well to the antibiotics and was afebrile at th etime of discharge. 3. End-stage renal disease.  Renal was on board, and the patient used     to get hemodialysis on Monday, Wednesday, Friday. 4. Elevated CK and CK-MB.  The patient showed some nonspecific EKG     changes and had elevated CK and CK-MB for which cardiology consult     was done, but the patient was ruled out for any cardiac etiology     for his abnormal EKG or elevated CK and CK-MB. 5. Alcohol abuse.  The patient denied any alcohol abuse at this time.     He was counseled on alcohol cessation.  The patient was continued on     thiamine and folic acid and he was also placed on CIWA protocol     given his long-term history of alcohol abuse. 6. Hepatitis C.  His genotype was again ordered but it was pending at     the time of discharge.  DISCHARGE LABORATORY DATA AND VITALS:  His discharge labs include sodium 133, potassium 5.2, chloride 100, bicarb 22, glucose 94, BUN 39, creatinine 9.68.  His white cell count was 3.5, hemoglobin 8.3, hematocrit 26, and platelets of 302,000.  His albumin was 2.2, phosphorus 6.6.  Discharge vitals : Temperature 97.6, pulse 70, respiratory rate 15, blood pressure 123/75, and O2 sats 93% on room air.    ______________________________ Elyse Jarvis, MD   ______________________________ Acey Lav, MD    MS/MEDQ  D:  10/04/2010  T:  10/05/2010  Job:  [id number]  cc:   Melida Quitter, MD  Electronically Signed by Elyse Jarvis MD on 10/09/2010 11:11:44 AM Electronically Signed by Paulette Blanch DAM MD on 10/10/2010 12:08:17 PM

## 2010-10-15 LAB — RAPID URINE DRUG SCREEN, HOSP PERFORMED
Barbiturates: NOT DETECTED
Barbiturates: NOT DETECTED
Benzodiazepines: NOT DETECTED
Opiates: NOT DETECTED
Opiates: NOT DETECTED
Tetrahydrocannabinol: NOT DETECTED
Tetrahydrocannabinol: NOT DETECTED

## 2010-10-15 LAB — DIFFERENTIAL
Basophils Absolute: 0 10*3/uL (ref 0.0–0.1)
Basophils Absolute: 0 10*3/uL (ref 0.0–0.1)
Basophils Absolute: 0 10*3/uL (ref 0.0–0.1)
Basophils Relative: 0 % (ref 0–1)
Basophils Relative: 1 % (ref 0–1)
Eosinophils Absolute: 0 10*3/uL (ref 0.0–0.7)
Eosinophils Absolute: 0.1 10*3/uL (ref 0.0–0.7)
Eosinophils Relative: 0 % (ref 0–5)
Lymphocytes Relative: 48 % — ABNORMAL HIGH (ref 12–46)
Lymphocytes Relative: 55 % — ABNORMAL HIGH (ref 12–46)
Lymphocytes Relative: 61 % — ABNORMAL HIGH (ref 12–46)
Lymphocytes Relative: 52 % — ABNORMAL HIGH (ref 12–46)
Lymphs Abs: 2 10*3/uL (ref 0.7–4.0)
Lymphs Abs: 1.4 10*3/uL (ref 0.7–4.0)
Monocytes Absolute: 0.3 10*3/uL (ref 0.1–1.0)
Monocytes Relative: 10 % (ref 3–12)
Monocytes Relative: 4 % (ref 3–12)
Neutro Abs: 0.8 10*3/uL — ABNORMAL LOW (ref 1.7–7.7)
Neutro Abs: 1 10*3/uL — ABNORMAL LOW (ref 1.7–7.7)
Neutro Abs: 1.7 10*3/uL (ref 1.7–7.7)
Neutro Abs: 2 10*3/uL (ref 1.7–7.7)
Neutro Abs: 1 10*3/uL — ABNORMAL LOW (ref 1.7–7.7)
Neutrophils Relative %: 33 % — ABNORMAL LOW (ref 43–77)
Neutrophils Relative %: 48 % (ref 43–77)
Neutrophils Relative %: 36 % — ABNORMAL LOW (ref 43–77)

## 2010-10-15 LAB — LIPASE, BLOOD
Lipase: 132 U/L — ABNORMAL HIGH (ref 11–59)
Lipase: 16 U/L (ref 11–59)

## 2010-10-15 LAB — CBC
HCT: 33 % — ABNORMAL LOW (ref 39.0–52.0)
HCT: 34.4 % — ABNORMAL LOW (ref 39.0–52.0)
Hemoglobin: 11.2 g/dL — ABNORMAL LOW (ref 13.0–17.0)
Hemoglobin: 11.7 g/dL — ABNORMAL LOW (ref 13.0–17.0)
Hemoglobin: 12.9 g/dL — ABNORMAL LOW (ref 13.0–17.0)
Hemoglobin: 11.4 g/dL — ABNORMAL LOW (ref 13.0–17.0)
Hemoglobin: 12 g/dL — ABNORMAL LOW (ref 13.0–17.0)
MCH: 31.1 pg (ref 26.0–34.0)
MCH: 31.8 pg (ref 26.0–34.0)
MCH: 31.3 pg (ref 26.0–34.0)
MCHC: 33.9 g/dL (ref 30.0–36.0)
MCHC: 34.5 g/dL (ref 30.0–36.0)
MCHC: 32.3 g/dL (ref 30.0–36.0)
MCHC: 33.1 g/dL (ref 30.0–36.0)
MCV: 91.5 fL (ref 78.0–100.0)
MCV: 92.2 fL (ref 78.0–100.0)
MCV: 94.5 fL (ref 78.0–100.0)
MCV: 95.5 fL (ref 78.0–100.0)
Platelets: 113 10*3/uL — ABNORMAL LOW (ref 150–400)
Platelets: 53 10*3/uL — ABNORMAL LOW (ref 150–400)
Platelets: 78 10*3/uL — ABNORMAL LOW (ref 150–400)
Platelets: 98 10*3/uL — ABNORMAL LOW (ref 150–400)
Platelets: 149 10*3/uL — ABNORMAL LOW (ref 150–400)
Platelets: 154 10*3/uL (ref 150–400)
RBC: 3.33 MIL/uL — ABNORMAL LOW (ref 4.22–5.81)
RBC: 3.76 MIL/uL — ABNORMAL LOW (ref 4.22–5.81)
RBC: 4.06 MIL/uL — ABNORMAL LOW (ref 4.22–5.81)
RDW: 15.1 % (ref 11.5–15.5)
RDW: 15.6 % — ABNORMAL HIGH (ref 11.5–15.5)
WBC: 3 10*3/uL — ABNORMAL LOW (ref 4.0–10.5)
WBC: 3.1 10*3/uL — ABNORMAL LOW (ref 4.0–10.5)
WBC: 3.2 10*3/uL — ABNORMAL LOW (ref 4.0–10.5)
WBC: 3.6 10*3/uL — ABNORMAL LOW (ref 4.0–10.5)
WBC: 4.1 10*3/uL (ref 4.0–10.5)
WBC: 1.8 10*3/uL — ABNORMAL LOW (ref 4.0–10.5)
WBC: 2.5 10*3/uL — ABNORMAL LOW (ref 4.0–10.5)

## 2010-10-15 LAB — RENAL FUNCTION PANEL
Albumin: 3.1 g/dL — ABNORMAL LOW (ref 3.5–5.2)
CO2: 26 mEq/L (ref 19–32)
Calcium: 8.6 mg/dL (ref 8.4–10.5)
GFR calc Af Amer: 14 mL/min — ABNORMAL LOW (ref >60)
GFR calc non Af Amer: 8 mL/min — ABNORMAL LOW (ref >60)
GFR calc non Af Amer: 11 mL/min — ABNORMAL LOW (ref >60)
Phosphorus: 5.5 mg/dL — ABNORMAL HIGH (ref 2.3–4.6)
Phosphorus: 4.3 mg/dL (ref 2.3–4.6)
Potassium: 4.9 mEq/L (ref 3.5–5.1)
Potassium: 4 mEq/L (ref 3.5–5.1)
Sodium: 141 mEq/L (ref 135–145)
Sodium: 137 mEq/L (ref 135–145)

## 2010-10-15 LAB — HEPATIC FUNCTION PANEL
ALT: 79 U/L — ABNORMAL HIGH (ref 0–53)
ALT: 72 U/L — ABNORMAL HIGH (ref 0–53)
AST: 121 U/L — ABNORMAL HIGH (ref 0–37)
AST: 85 U/L — ABNORMAL HIGH (ref 0–37)
Albumin: 3.2 g/dL — ABNORMAL LOW (ref 3.5–5.2)
Alkaline Phosphatase: 68 U/L (ref 39–117)
Alkaline Phosphatase: 74 U/L (ref 39–117)
Total Bilirubin: 0.6 mg/dL (ref 0.3–1.2)
Total Protein: 6.3 g/dL (ref 6.0–8.3)
Total Protein: 6.9 g/dL (ref 6.0–8.3)
Total Protein: 7.2 g/dL (ref 6.0–8.3)

## 2010-10-15 LAB — BASIC METABOLIC PANEL
BUN: 34 mg/dL — ABNORMAL HIGH (ref 6–23)
BUN: 49 mg/dL — ABNORMAL HIGH (ref 6–23)
BUN: 53 mg/dL — ABNORMAL HIGH (ref 6–23)
CO2: 20 mEq/L (ref 19–32)
Calcium: 7.7 mg/dL — ABNORMAL LOW (ref 8.4–10.5)
Calcium: 7.6 mg/dL — ABNORMAL LOW (ref 8.4–10.5)
Calcium: 7.6 mg/dL — ABNORMAL LOW (ref 8.4–10.5)
Chloride: 102 mEq/L (ref 96–112)
Chloride: 106 mEq/L (ref 96–112)
Creatinine, Ser: 7.65 mg/dL — ABNORMAL HIGH (ref 0.4–1.5)
GFR calc Af Amer: 12 mL/min — ABNORMAL LOW (ref >60)
GFR calc Af Amer: 9 mL/min — ABNORMAL LOW (ref >60)
GFR calc non Af Amer: 10 mL/min — ABNORMAL LOW (ref >60)
GFR calc non Af Amer: 17 mL/min — ABNORMAL LOW (ref >60)
GFR calc non Af Amer: 10 mL/min — ABNORMAL LOW (ref >60)
GFR calc non Af Amer: 7 mL/min — ABNORMAL LOW (ref >60)
Glucose, Bld: 91 mg/dL (ref 70–99)
Glucose, Bld: 92 mg/dL (ref 70–99)
Glucose, Bld: 88 mg/dL (ref 70–99)
Potassium: 4.2 mEq/L (ref 3.5–5.1)
Potassium: 4.7 mEq/L (ref 3.5–5.1)
Potassium: 4.9 mEq/L (ref 3.5–5.1)
Sodium: 137 mEq/L (ref 135–145)
Sodium: 132 mEq/L — ABNORMAL LOW (ref 135–145)
Sodium: 134 mEq/L — ABNORMAL LOW (ref 135–145)

## 2010-10-15 LAB — POCT I-STAT, CHEM 8
BUN: 69 mg/dL — ABNORMAL HIGH (ref 6–23)
Calcium, Ion: 0.87 mmol/L — ABNORMAL LOW (ref 1.12–1.32)
Creatinine, Ser: 7.9 mg/dL — ABNORMAL HIGH (ref 0.4–1.5)
Glucose, Bld: 57 mg/dL — ABNORMAL LOW (ref 70–99)
Hemoglobin: 13.6 g/dL (ref 13.0–17.0)
TCO2: 13 mmol/L (ref 0–100)

## 2010-10-15 LAB — SURGICAL PCR SCREEN
MRSA, PCR: NEGATIVE
Staphylococcus aureus: POSITIVE — AB

## 2010-10-15 LAB — ETHANOL
Alcohol, Ethyl (B): 196 mg/dL — ABNORMAL HIGH (ref 0–10)
Alcohol, Ethyl (B): 221 mg/dL — ABNORMAL HIGH (ref 0–10)

## 2010-10-15 LAB — COMPREHENSIVE METABOLIC PANEL
AST: 95 U/L — ABNORMAL HIGH (ref 0–37)
Albumin: 3.2 g/dL — ABNORMAL LOW (ref 3.5–5.2)
Alkaline Phosphatase: 71 U/L (ref 39–117)
BUN: 66 mg/dL — ABNORMAL HIGH (ref 6–23)
CO2: 14 mEq/L — ABNORMAL LOW (ref 19–32)
Chloride: 102 mEq/L (ref 96–112)
Creatinine, Ser: 7.09 mg/dL — ABNORMAL HIGH (ref 0.4–1.5)
GFR calc non Af Amer: 8 mL/min — ABNORMAL LOW (ref >60)
Potassium: 4 mEq/L (ref 3.5–5.1)
Total Bilirubin: 0.5 mg/dL (ref 0.3–1.2)

## 2010-10-15 LAB — PROTIME-INR
INR: 0.97 (ref 0.00–1.49)
INR: 0.9 (ref 0.00–1.49)
Prothrombin Time: 12.4 seconds (ref 11.6–15.2)

## 2010-10-15 LAB — WOUND CULTURE

## 2010-10-15 LAB — HEPATITIS B SURFACE ANTIGEN: Hepatitis B Surface Ag: NEGATIVE

## 2010-10-15 LAB — URINALYSIS, ROUTINE W REFLEX MICROSCOPIC
Bilirubin Urine: NEGATIVE
Glucose, UA: NEGATIVE mg/dL
Ketones, ur: NEGATIVE mg/dL
Leukocytes, UA: NEGATIVE
Specific Gravity, Urine: 1.012 (ref 1.005–1.030)
pH: 6 (ref 5.0–8.0)

## 2010-10-15 LAB — TROPONIN I: Troponin I: 0.02 ng/mL (ref 0.00–0.06)

## 2010-10-15 LAB — APTT: aPTT: 34 seconds (ref 24–37)

## 2010-10-15 LAB — CK TOTAL AND CKMB (NOT AT ARMC)
Relative Index: 1.7 (ref 0.0–2.5)
Total CK: 172 U/L (ref 7–232)

## 2010-10-15 LAB — URINE MICROSCOPIC-ADD ON

## 2010-10-15 LAB — MRSA PCR SCREENING
MRSA by PCR: NEGATIVE
MRSA by PCR: NEGATIVE

## 2010-10-15 IMAGING — CR DG CHEST 1V PORT
1 series · 1 of 1 positions shown · non-contrast
Comparison: 01/21/2010

CLINICAL DATA: Dialysis patient.  Left-sided chest pain.  History
of smoking.  Total body cramping.

PORTABLE CHEST - 1 VIEW

[view not recorded]
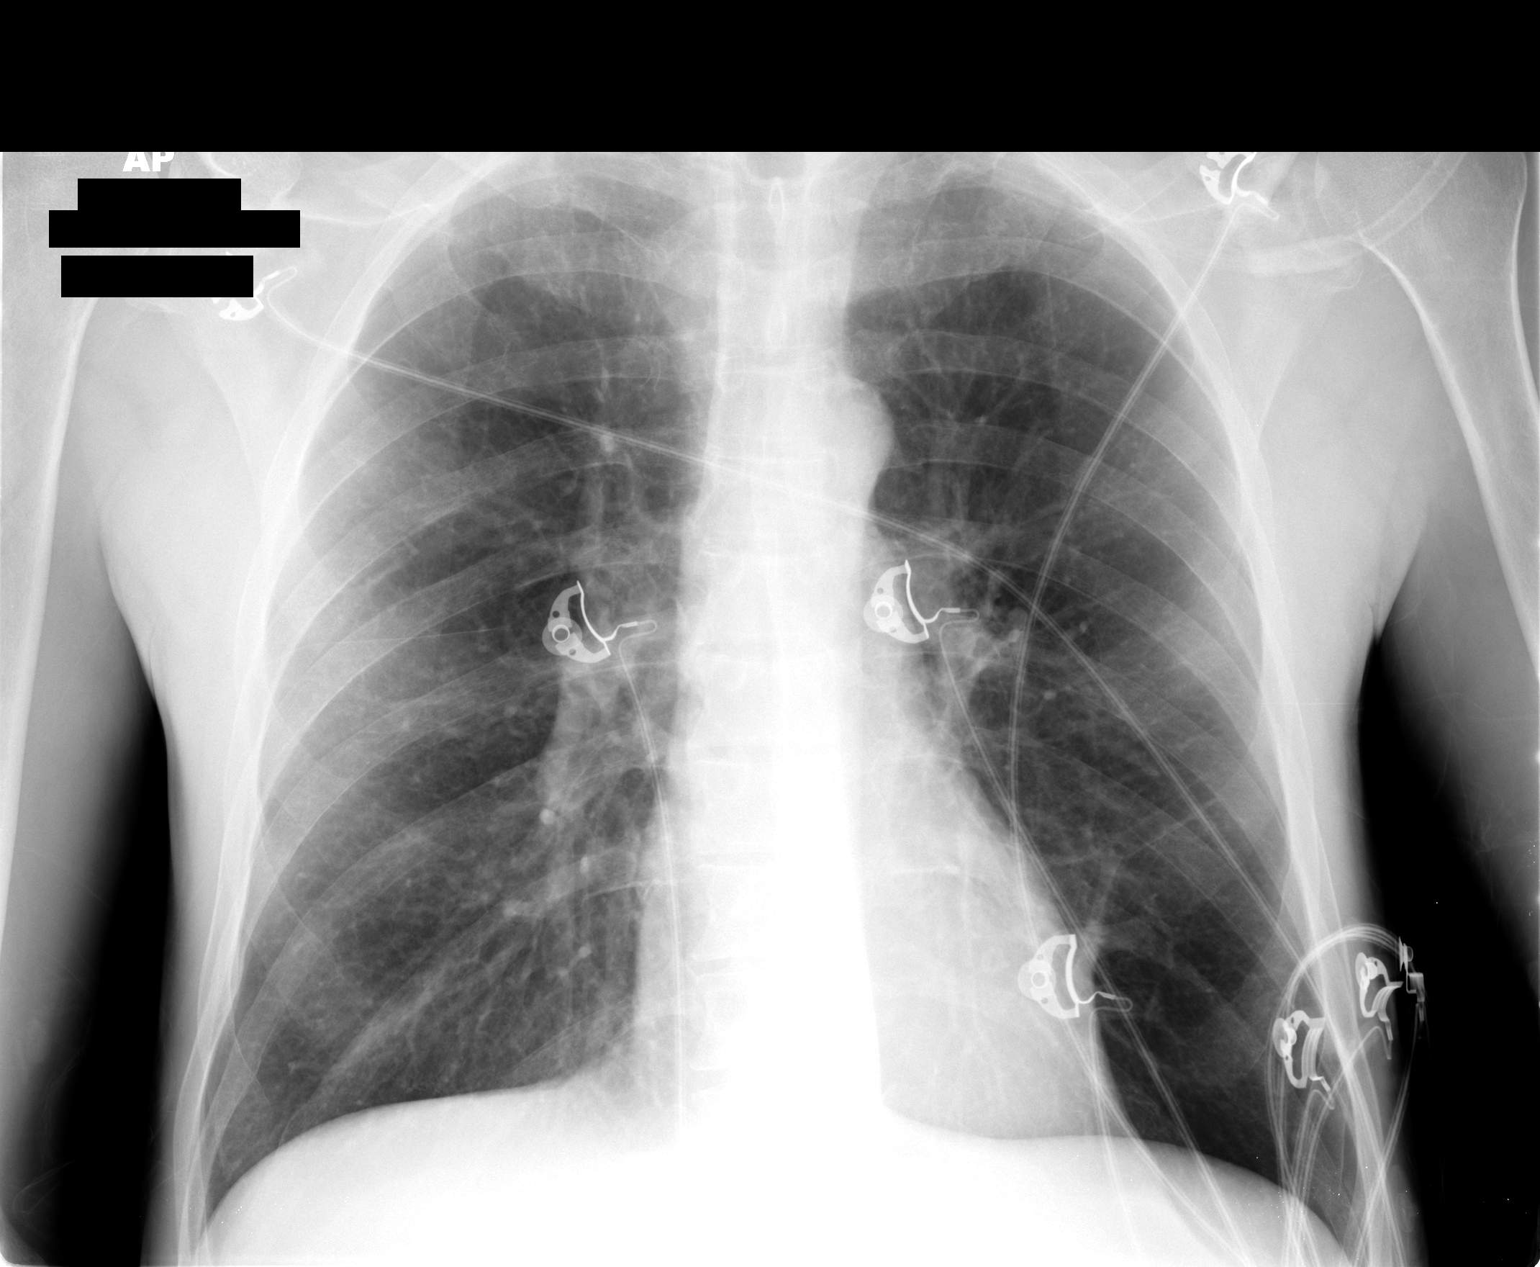

[1 of 1 positions shown; findings below may reference images not displayed]

FINDINGS: Lungs are hyperinflated.  There are perihilar bronchitic
changes.  No focal pulmonary consolidation.  No pulmonary edema.
Heart size is normal.
IMPRESSION: Hyperinflation. No evidence for focal pulmonary abnormality.

## 2010-10-16 ENCOUNTER — Ambulatory Visit (HOSPITAL_COMMUNITY)
Admission: RE | Admit: 2010-10-16 | Discharge: 2010-10-16 | Disposition: A | Discharge status: Home / Self Care | Payer: Medicare Other | Source: Ambulatory Visit | Attending: Vascular Surgery | Admitting: Vascular Surgery

## 2010-10-16 DIAGNOSIS — Z992 Dependence on renal dialysis: Secondary | ICD-10-CM | POA: Insufficient documentation

## 2010-10-16 DIAGNOSIS — M2550 Pain in unspecified joint: Secondary | ICD-10-CM | POA: Insufficient documentation

## 2010-10-16 DIAGNOSIS — M109 Gout, unspecified: Secondary | ICD-10-CM | POA: Insufficient documentation

## 2010-10-16 DIAGNOSIS — IMO0001 Reserved for inherently not codable concepts without codable children: Secondary | ICD-10-CM | POA: Insufficient documentation

## 2010-10-16 DIAGNOSIS — R0609 Other forms of dyspnea: Secondary | ICD-10-CM | POA: Insufficient documentation

## 2010-10-16 DIAGNOSIS — F17200 Nicotine dependence, unspecified, uncomplicated: Secondary | ICD-10-CM | POA: Insufficient documentation

## 2010-10-16 DIAGNOSIS — N186 End stage renal disease: Secondary | ICD-10-CM | POA: Insufficient documentation

## 2010-10-16 DIAGNOSIS — Z01812 Encounter for preprocedural laboratory examination: Secondary | ICD-10-CM | POA: Insufficient documentation

## 2010-10-16 DIAGNOSIS — R0989 Other specified symptoms and signs involving the circulatory and respiratory systems: Secondary | ICD-10-CM | POA: Insufficient documentation

## 2010-10-16 DIAGNOSIS — R51 Headache: Secondary | ICD-10-CM | POA: Insufficient documentation

## 2010-10-16 DIAGNOSIS — Z79899 Other long term (current) drug therapy: Secondary | ICD-10-CM | POA: Insufficient documentation

## 2010-10-16 DIAGNOSIS — J4489 Other specified chronic obstructive pulmonary disease: Secondary | ICD-10-CM | POA: Insufficient documentation

## 2010-10-16 DIAGNOSIS — I120 Hypertensive chronic kidney disease with stage 5 chronic kidney disease or end stage renal disease: Secondary | ICD-10-CM

## 2010-10-16 DIAGNOSIS — J449 Chronic obstructive pulmonary disease, unspecified: Secondary | ICD-10-CM | POA: Insufficient documentation

## 2010-10-16 LAB — RENAL FUNCTION PANEL
BUN: 45 mg/dL — ABNORMAL HIGH (ref 6–23)
CO2: 24 mEq/L (ref 19–32)
Calcium: 7.9 mg/dL — ABNORMAL LOW (ref 8.4–10.5)
Calcium: 8.7 mg/dL (ref 8.4–10.5)
Glucose, Bld: 101 mg/dL — ABNORMAL HIGH (ref 70–99)
Glucose, Bld: 172 mg/dL — ABNORMAL HIGH (ref 70–99)
Phosphorus: 6.9 mg/dL — ABNORMAL HIGH (ref 2.3–4.6)
Potassium: 4.6 mEq/L (ref 3.5–5.1)
Sodium: 137 mEq/L (ref 135–145)

## 2010-10-16 LAB — CBC
HCT: 36.5 % — ABNORMAL LOW (ref 39.0–52.0)
HCT: 37.3 % — ABNORMAL LOW (ref 39.0–52.0)
HCT: 39 % (ref 39.0–52.0)
HCT: 40 % (ref 39.0–52.0)
Hemoglobin: 12.5 g/dL — ABNORMAL LOW (ref 13.0–17.0)
Hemoglobin: 13.7 g/dL (ref 13.0–17.0)
MCH: 30.8 pg (ref 26.0–34.0)
MCH: 31.2 pg (ref 26.0–34.0)
MCHC: 32.1 g/dL (ref 30.0–36.0)
MCHC: 32.9 g/dL (ref 30.0–36.0)
MCHC: 32.9 g/dL (ref 30.0–36.0)
MCHC: 33 g/dL (ref 30.0–36.0)
MCV: 94 fL (ref 78.0–100.0)
MCV: 94.8 fL (ref 78.0–100.0)
MCV: 95.1 fL (ref 78.0–100.0)
Platelets: 104 10*3/uL — ABNORMAL LOW (ref 150–400)
Platelets: 112 10*3/uL — ABNORMAL LOW (ref 150–400)
Platelets: 135 10*3/uL — ABNORMAL LOW (ref 150–400)
Platelets: 140 10*3/uL — ABNORMAL LOW (ref 150–400)
RDW: 16.1 % — ABNORMAL HIGH (ref 11.5–15.5)
RDW: 16.2 % — ABNORMAL HIGH (ref 11.5–15.5)
RDW: 16.2 % — ABNORMAL HIGH (ref 11.5–15.5)
RDW: 16.2 % — ABNORMAL HIGH (ref 11.5–15.5)
WBC: 3.6 10*3/uL — ABNORMAL LOW (ref 4.0–10.5)

## 2010-10-16 LAB — DIFFERENTIAL
Basophils Relative: 2 % — ABNORMAL HIGH (ref 0–1)
Eosinophils Absolute: 0.1 10*3/uL (ref 0.0–0.7)
Monocytes Absolute: 0.4 10*3/uL (ref 0.1–1.0)
Neutrophils Relative %: 20 % — ABNORMAL LOW (ref 43–77)

## 2010-10-16 LAB — BASIC METABOLIC PANEL
BUN: 21 mg/dL (ref 6–23)
BUN: 45 mg/dL — ABNORMAL HIGH (ref 6–23)
CO2: 25 mEq/L (ref 19–32)
Calcium: 8.8 mg/dL (ref 8.4–10.5)
Creatinine, Ser: 4.35 mg/dL — ABNORMAL HIGH (ref 0.4–1.5)
Creatinine, Ser: 6.17 mg/dL — ABNORMAL HIGH (ref 0.4–1.5)
GFR calc non Af Amer: 10 mL/min — ABNORMAL LOW (ref >60)
Glucose, Bld: 100 mg/dL — ABNORMAL HIGH (ref 70–99)
Glucose, Bld: 85 mg/dL (ref 70–99)
Potassium: 5.6 mEq/L — ABNORMAL HIGH (ref 3.5–5.1)

## 2010-10-16 LAB — POCT I-STAT 4, (NA,K, GLUC, HGB,HCT): Sodium: 143 mEq/L (ref 135–145)

## 2010-10-16 LAB — COMPREHENSIVE METABOLIC PANEL
ALT: 39 U/L (ref 0–53)
AST: 50 U/L — ABNORMAL HIGH (ref 0–37)
Albumin: 3.5 g/dL (ref 3.5–5.2)
Alkaline Phosphatase: 53 U/L (ref 39–117)
CO2: 17 mEq/L — ABNORMAL LOW (ref 19–32)
Chloride: 104 mEq/L (ref 96–112)
Creatinine, Ser: 5.93 mg/dL — ABNORMAL HIGH (ref 0.4–1.5)
GFR calc Af Amer: 12 mL/min — ABNORMAL LOW (ref >60)
GFR calc non Af Amer: 10 mL/min — ABNORMAL LOW (ref >60)
Potassium: 4.3 mEq/L (ref 3.5–5.1)
Sodium: 134 mEq/L — ABNORMAL LOW (ref 135–145)
Total Bilirubin: 0.5 mg/dL (ref 0.3–1.2)

## 2010-10-16 LAB — LIPASE, BLOOD: Lipase: 60 U/L — ABNORMAL HIGH (ref 11–59)

## 2010-10-16 LAB — URINALYSIS, ROUTINE W REFLEX MICROSCOPIC
Bilirubin Urine: NEGATIVE
Ketones, ur: NEGATIVE mg/dL
Leukocytes, UA: NEGATIVE
Nitrite: NEGATIVE
Protein, ur: >300 mg/dL — AB
Urobilinogen, UA: 0.2 mg/dL (ref 0.0–1.0)
pH: 5.5 (ref 5.0–8.0)

## 2010-10-16 LAB — AMYLASE: Amylase: 221 U/L — ABNORMAL HIGH (ref 0–105)

## 2010-10-16 LAB — HEPATITIS C ANTIBODY: HCV Ab: REACTIVE — AB

## 2010-10-16 LAB — PATHOLOGIST SMEAR REVIEW

## 2010-10-16 LAB — URINE MICROSCOPIC-ADD ON

## 2010-10-17 LAB — RAPID URINE DRUG SCREEN, HOSP PERFORMED
Amphetamines: NOT DETECTED
Amphetamines: NOT DETECTED
Barbiturates: NOT DETECTED
Benzodiazepines: POSITIVE — AB
Tetrahydrocannabinol: NOT DETECTED
Tetrahydrocannabinol: NOT DETECTED

## 2010-10-17 LAB — URINALYSIS, ROUTINE W REFLEX MICROSCOPIC
Ketones, ur: NEGATIVE mg/dL
Leukocytes, UA: NEGATIVE
Nitrite: NEGATIVE
Protein, ur: 100 mg/dL — AB
Urobilinogen, UA: 0.2 mg/dL (ref 0.0–1.0)

## 2010-10-17 LAB — CBC
HCT: 34.2 % — ABNORMAL LOW (ref 39.0–52.0)
HCT: 23.6 % — ABNORMAL LOW (ref 39.0–52.0)
HCT: 25.5 % — ABNORMAL LOW (ref 39.0–52.0)
HCT: 26.6 % — ABNORMAL LOW (ref 39.0–52.0)
HCT: 29.9 % — ABNORMAL LOW (ref 39.0–52.0)
HCT: 32.8 % — ABNORMAL LOW (ref 39.0–52.0)
Hemoglobin: 10.4 g/dL — ABNORMAL LOW (ref 13.0–17.0)
Hemoglobin: 10.6 g/dL — ABNORMAL LOW (ref 13.0–17.0)
Hemoglobin: 7.8 g/dL — ABNORMAL LOW (ref 13.0–17.0)
Hemoglobin: 8.4 g/dL — ABNORMAL LOW (ref 13.0–17.0)
Hemoglobin: 9 g/dL — ABNORMAL LOW (ref 13.0–17.0)
Hemoglobin: 9 g/dL — ABNORMAL LOW (ref 13.0–17.0)
Hemoglobin: 9.4 g/dL — ABNORMAL LOW (ref 13.0–17.0)
MCH: 30.4 pg (ref 26.0–34.0)
MCH: 30.5 pg (ref 26.0–34.0)
MCH: 30.5 pg (ref 26.0–34.0)
MCH: 30.7 pg (ref 26.0–34.0)
MCH: 31.3 pg (ref 26.0–34.0)
MCH: 31.3 pg (ref 26.0–34.0)
MCH: 31.6 pg (ref 26.0–34.0)
MCHC: 31.4 g/dL (ref 30.0–36.0)
MCHC: 30.9 g/dL (ref 30.0–36.0)
MCHC: 31.4 g/dL (ref 30.0–36.0)
MCHC: 31.9 g/dL (ref 30.0–36.0)
MCHC: 32.1 g/dL (ref 30.0–36.0)
MCHC: 32.3 g/dL (ref 30.0–36.0)
MCHC: 32.9 g/dL (ref 30.0–36.0)
MCV: 96.1 fL (ref 78.0–100.0)
MCV: 96.2 fL (ref 78.0–100.0)
MCV: 97 fL (ref 78.0–100.0)
MCV: 95.1 fL (ref 78.0–100.0)
MCV: 95.8 fL (ref 78.0–100.0)
MCV: 95.9 fL (ref 78.0–100.0)
MCV: 96.3 fL (ref 78.0–100.0)
MCV: 97.2 fL (ref 78.0–100.0)
Platelets: 176 10*3/uL (ref 150–400)
Platelets: 184 10*3/uL (ref 150–400)
Platelets: 149 10*3/uL — ABNORMAL LOW (ref 150–400)
Platelets: 96 10*3/uL — ABNORMAL LOW (ref 150–400)
Platelets: 96 10*3/uL — ABNORMAL LOW (ref 150–400)
RBC: 3.38 MIL/uL — ABNORMAL LOW (ref 4.22–5.81)
RBC: 3.42 MIL/uL — ABNORMAL LOW (ref 4.22–5.81)
RBC: 3.56 MIL/uL — ABNORMAL LOW (ref 4.22–5.81)
RBC: 3.65 MIL/uL — ABNORMAL LOW (ref 4.22–5.81)
RBC: 2.45 MIL/uL — ABNORMAL LOW (ref 4.22–5.81)
RBC: 2.82 MIL/uL — ABNORMAL LOW (ref 4.22–5.81)
RDW: 18.1 % — ABNORMAL HIGH (ref 11.5–15.5)
RDW: 18.2 % — ABNORMAL HIGH (ref 11.5–15.5)
RDW: 18.7 % — ABNORMAL HIGH (ref 11.5–15.5)
RDW: 19.3 % — ABNORMAL HIGH (ref 11.5–15.5)
WBC: 3.5 10*3/uL — ABNORMAL LOW (ref 4.0–10.5)
WBC: 4.1 10*3/uL (ref 4.0–10.5)
WBC: 2.5 10*3/uL — ABNORMAL LOW (ref 4.0–10.5)
WBC: 3.9 10*3/uL — ABNORMAL LOW (ref 4.0–10.5)
WBC: 4.4 10*3/uL (ref 4.0–10.5)

## 2010-10-17 LAB — RENAL FUNCTION PANEL
Albumin: 3.2 g/dL — ABNORMAL LOW (ref 3.5–5.2)
BUN: 14 mg/dL (ref 6–23)
BUN: 56 mg/dL — ABNORMAL HIGH (ref 6–23)
CO2: 26 mEq/L (ref 19–32)
CO2: 28 mEq/L (ref 19–32)
CO2: 23 mEq/L (ref 19–32)
Calcium: 9.3 mg/dL (ref 8.4–10.5)
Calcium: 8.4 mg/dL (ref 8.4–10.5)
Calcium: 8.5 mg/dL (ref 8.4–10.5)
Calcium: 8.9 mg/dL (ref 8.4–10.5)
Chloride: 98 mEq/L (ref 96–112)
Creatinine, Ser: 5.9 mg/dL — ABNORMAL HIGH (ref 0.4–1.5)
GFR calc Af Amer: 12 mL/min — ABNORMAL LOW (ref >60)
GFR calc Af Amer: 10 mL/min — ABNORMAL LOW (ref >60)
GFR calc Af Amer: 12 mL/min — ABNORMAL LOW (ref >60)
GFR calc Af Amer: 16 mL/min — ABNORMAL LOW (ref >60)
GFR calc non Af Amer: 10 mL/min — ABNORMAL LOW (ref >60)
GFR calc non Af Amer: 13 mL/min — ABNORMAL LOW (ref >60)
GFR calc non Af Amer: 8 mL/min — ABNORMAL LOW (ref >60)
Glucose, Bld: 115 mg/dL — ABNORMAL HIGH (ref 70–99)
Glucose, Bld: 102 mg/dL — ABNORMAL HIGH (ref 70–99)
Phosphorus: 3.2 mg/dL (ref 2.3–4.6)
Phosphorus: 4.2 mg/dL (ref 2.3–4.6)
Phosphorus: 5 mg/dL — ABNORMAL HIGH (ref 2.3–4.6)
Potassium: 3.9 mEq/L (ref 3.5–5.1)
Potassium: 4.5 mEq/L (ref 3.5–5.1)
Potassium: 4.6 mEq/L (ref 3.5–5.1)
Potassium: 5.3 mEq/L — ABNORMAL HIGH (ref 3.5–5.1)
Sodium: 133 mEq/L — ABNORMAL LOW (ref 135–145)
Sodium: 137 mEq/L (ref 135–145)
Sodium: 140 mEq/L (ref 135–145)

## 2010-10-17 LAB — DIFFERENTIAL
Basophils Absolute: 0 10*3/uL (ref 0.0–0.1)
Basophils Absolute: 0.1 10*3/uL (ref 0.0–0.1)
Basophils Relative: 1 % (ref 0–1)
Eosinophils Absolute: 0 10*3/uL (ref 0.0–0.7)
Eosinophils Relative: 1 % (ref 0–5)
Eosinophils Relative: 3 % (ref 0–5)
Lymphocytes Relative: 50 % — ABNORMAL HIGH (ref 12–46)
Lymphs Abs: 2.3 10*3/uL (ref 0.7–4.0)
Lymphs Abs: 2.4 10*3/uL (ref 0.7–4.0)
Neutro Abs: 1.8 10*3/uL (ref 1.7–7.7)
Neutrophils Relative %: 39 % — ABNORMAL LOW (ref 43–77)

## 2010-10-17 LAB — CROSSMATCH: ABO/RH(D): O NEG

## 2010-10-17 LAB — COMPREHENSIVE METABOLIC PANEL
ALT: 56 U/L — ABNORMAL HIGH (ref 0–53)
AST: 21 U/L (ref 0–37)
AST: 131 U/L — ABNORMAL HIGH (ref 0–37)
AST: 31 U/L (ref 0–37)
Albumin: 3.3 g/dL — ABNORMAL LOW (ref 3.5–5.2)
Alkaline Phosphatase: 45 U/L (ref 39–117)
BUN: 40 mg/dL — ABNORMAL HIGH (ref 6–23)
BUN: 32 mg/dL — ABNORMAL HIGH (ref 6–23)
CO2: 23 mEq/L (ref 19–32)
CO2: 24 mEq/L (ref 19–32)
Calcium: 9.2 mg/dL (ref 8.4–10.5)
Calcium: 8.2 mg/dL — ABNORMAL LOW (ref 8.4–10.5)
Chloride: 102 mEq/L (ref 96–112)
Chloride: 100 mEq/L (ref 96–112)
Creatinine, Ser: 6.88 mg/dL — ABNORMAL HIGH (ref 0.4–1.5)
Creatinine, Ser: 6.36 mg/dL — ABNORMAL HIGH (ref 0.4–1.5)
GFR calc Af Amer: 10 mL/min — ABNORMAL LOW (ref >60)
GFR calc Af Amer: 11 mL/min — ABNORMAL LOW (ref >60)
GFR calc Af Amer: 14 mL/min — ABNORMAL LOW (ref >60)
GFR calc non Af Amer: 8 mL/min — ABNORMAL LOW (ref >60)
GFR calc non Af Amer: 12 mL/min — ABNORMAL LOW (ref >60)
GFR calc non Af Amer: 9 mL/min — ABNORMAL LOW (ref >60)
Potassium: 3.7 mEq/L (ref 3.5–5.1)
Sodium: 135 mEq/L (ref 135–145)
Total Bilirubin: 0.4 mg/dL (ref 0.3–1.2)
Total Bilirubin: 0.4 mg/dL (ref 0.3–1.2)
Total Bilirubin: 0.7 mg/dL (ref 0.3–1.2)

## 2010-10-17 LAB — POCT CARDIAC MARKERS
CKMB, poc: 1.1 ng/mL (ref 1.0–8.0)
CKMB, poc: 1.2 ng/mL (ref 1.0–8.0)
CKMB, poc: <1 ng/mL — ABNORMAL LOW (ref 1.0–8.0)
Troponin i, poc: <0.05 ng/mL (ref 0.00–0.09)
Troponin i, poc: <0.05 ng/mL (ref 0.00–0.09)
Troponin i, poc: <0.05 ng/mL (ref 0.00–0.09)

## 2010-10-17 LAB — HEMOCCULT GUIAC POC 1CARD (OFFICE): Fecal Occult Bld: POSITIVE

## 2010-10-17 LAB — BASIC METABOLIC PANEL
Calcium: 8.3 mg/dL — ABNORMAL LOW (ref 8.4–10.5)
GFR calc non Af Amer: 9 mL/min — ABNORMAL LOW (ref >60)
Glucose, Bld: 64 mg/dL — ABNORMAL LOW (ref 70–99)
Potassium: 5 mEq/L (ref 3.5–5.1)
Sodium: 136 mEq/L (ref 135–145)

## 2010-10-17 LAB — LIPASE, BLOOD: Lipase: 80 U/L — ABNORMAL HIGH (ref 11–59)

## 2010-10-17 LAB — TYPE AND SCREEN
ABO/RH(D): O NEG
Antibody Screen: NEGATIVE

## 2010-10-17 LAB — MAGNESIUM: Magnesium: 1.8 mg/dL (ref 1.5–2.5)

## 2010-10-17 LAB — URINE MICROSCOPIC-ADD ON

## 2010-10-17 LAB — HEPATITIS B SURFACE ANTIGEN
Hepatitis B Surface Ag: NEGATIVE
Hepatitis B Surface Ag: NEGATIVE

## 2010-10-17 LAB — H. PYLORI ANTIBODY, IGG: H Pylori IgG: 0.4 {ISR}

## 2010-10-17 IMAGING — CR DG RIBS W/ CHEST 3+V*R*
5 series · 5 of 5 positions shown · non-contrast
Comparison: 04/09/2010

CLINICAL DATA: Fall, right rib pain

RIGHT RIBS AND CHEST - 3+ VIEW

[w chest pa]
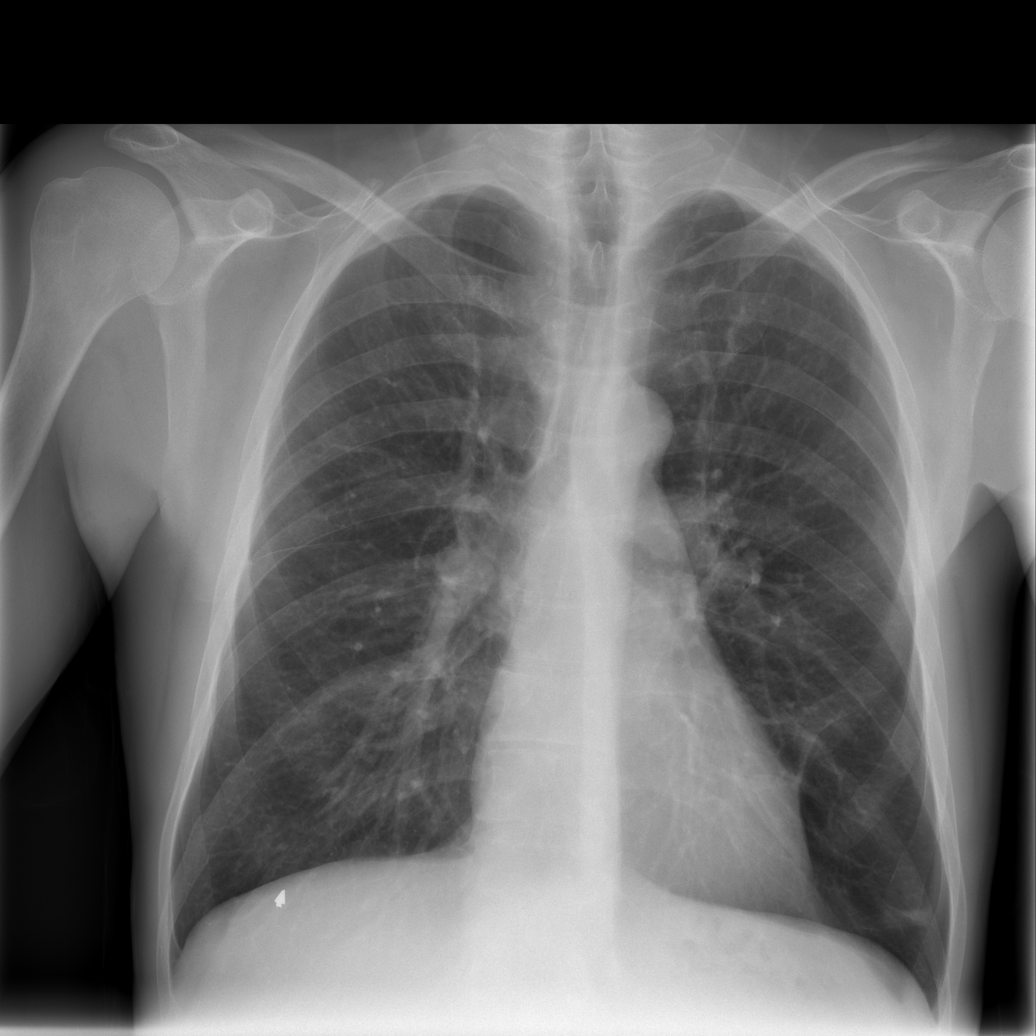

[w ribs ap/pa upper right *]
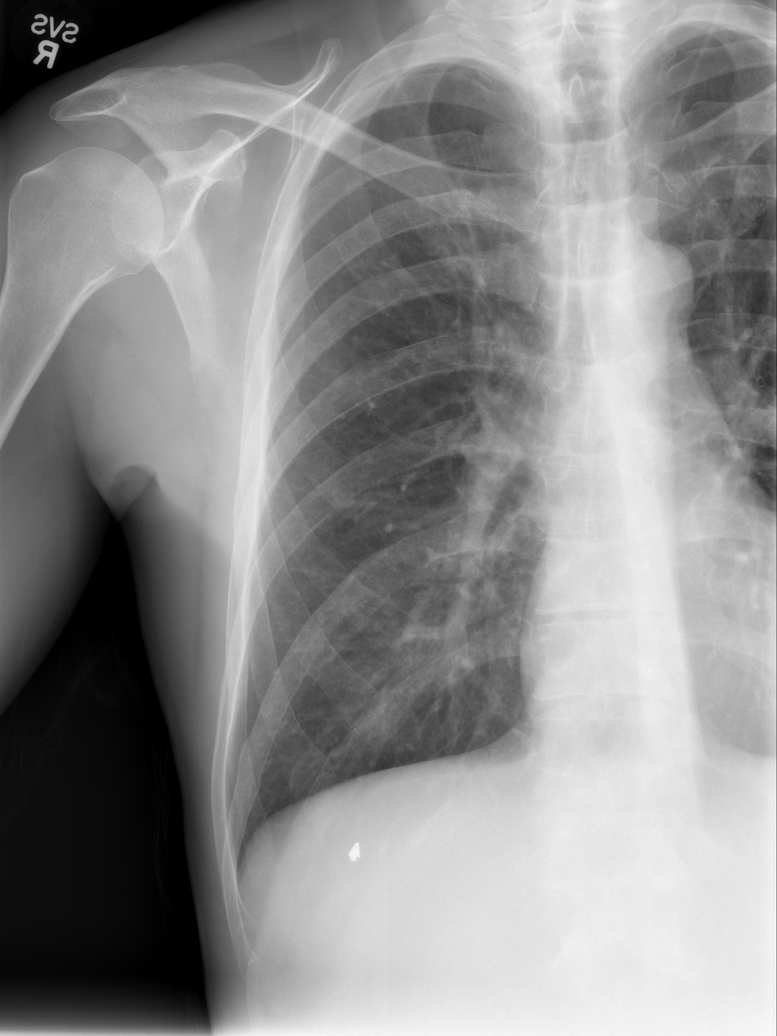

[w ribs ap/pa lower right *]
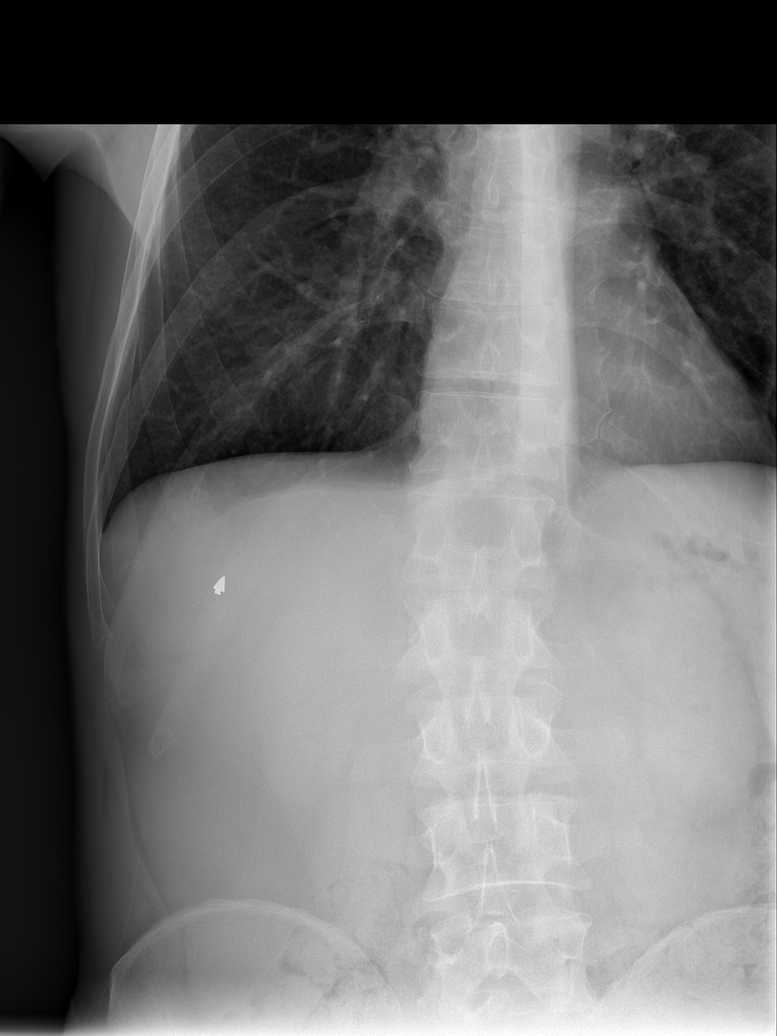

[w ribs oblique right * (1 of 2)]
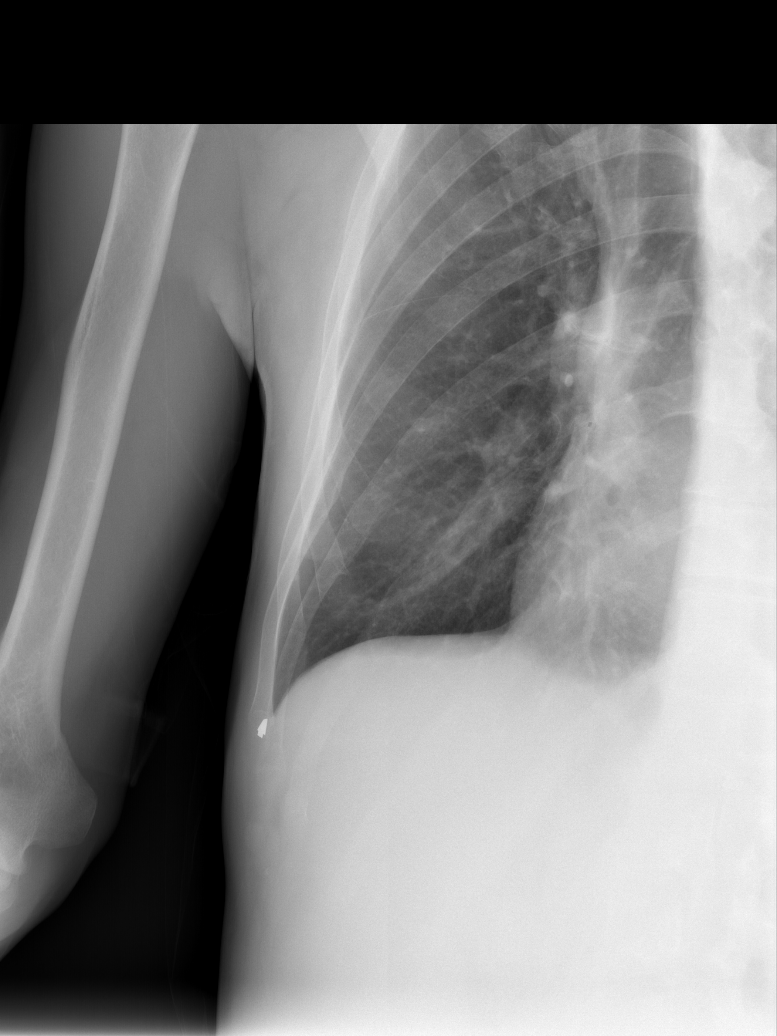

[w ribs oblique right * (2 of 2)]
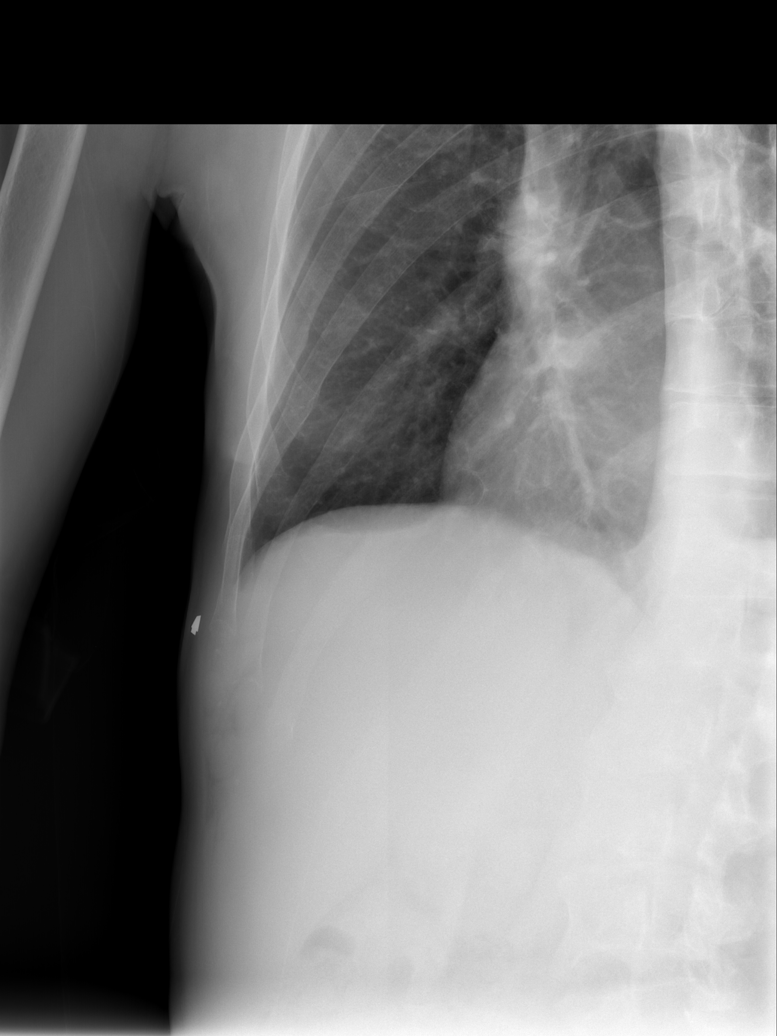

[5 of 5 positions shown; findings below may reference images not displayed]

FINDINGS: The lungs are hyperexpanded but clear.  The heart is
normal in size.  No pneumothorax or pleural effusion is seen.  An
arrow was placed in the site of pain and projecting over the lower
right lateral ribs.  However, no fractures seen in this region at
this time.
IMPRESSION: No displaced rib fractures seen.  Of note, nondisplaced rib
fractures are often occult by plain film evaluation.  There is
persistent pain, follow-up radiograph in 10-14 days may be of use.

## 2010-10-18 LAB — CARDIAC PANEL(CRET KIN+CKTOT+MB+TROPI)
CK, MB: 1.8 ng/mL (ref 0.3–4.0)
CK, MB: 2 ng/mL (ref 0.3–4.0)
Relative Index: 1.5 (ref 0.0–2.5)
Relative Index: 1.6 (ref 0.0–2.5)
Relative Index: 1.7 (ref 0.0–2.5)
Troponin I: 0.01 ng/mL (ref 0.00–0.06)
Troponin I: 0.04 ng/mL (ref 0.00–0.06)

## 2010-10-18 LAB — POTASSIUM: Potassium: 5.9 mEq/L — ABNORMAL HIGH (ref 3.5–5.1)

## 2010-10-18 LAB — COMPREHENSIVE METABOLIC PANEL
Albumin: 3.3 g/dL — ABNORMAL LOW (ref 3.5–5.2)
Albumin: 3.8 g/dL (ref 3.5–5.2)
Alkaline Phosphatase: 46 U/L (ref 39–117)
BUN: 32 mg/dL — ABNORMAL HIGH (ref 6–23)
BUN: 46 mg/dL — ABNORMAL HIGH (ref 6–23)
Calcium: 8.2 mg/dL — ABNORMAL LOW (ref 8.4–10.5)
Calcium: 8.2 mg/dL — ABNORMAL LOW (ref 8.4–10.5)
Chloride: 102 mEq/L (ref 96–112)
Creatinine, Ser: 5.26 mg/dL — ABNORMAL HIGH (ref 0.4–1.5)
Creatinine, Ser: 6.37 mg/dL — ABNORMAL HIGH (ref 0.4–1.5)
Glucose, Bld: 132 mg/dL — ABNORMAL HIGH (ref 70–99)
Potassium: 4 mEq/L (ref 3.5–5.1)
Total Bilirubin: 0.6 mg/dL (ref 0.3–1.2)
Total Protein: 7.2 g/dL (ref 6.0–8.3)
Total Protein: 8 g/dL (ref 6.0–8.3)

## 2010-10-18 LAB — DIFFERENTIAL
Basophils Absolute: 0 10*3/uL (ref 0.0–0.1)
Basophils Absolute: 0 10*3/uL (ref 0.0–0.1)
Basophils Relative: 0 % (ref 0–1)
Eosinophils Absolute: 0.1 10*3/uL (ref 0.0–0.7)
Eosinophils Relative: 2 % (ref 0–5)
Eosinophils Relative: 1 % (ref 0–5)
Lymphocytes Relative: 21 % (ref 12–46)
Lymphocytes Relative: 28 % (ref 12–46)
Lymphocytes Relative: 55 % — ABNORMAL HIGH (ref 12–46)
Lymphocytes Relative: 49 % — ABNORMAL HIGH (ref 12–46)
Lymphs Abs: 1.8 10*3/uL (ref 0.7–4.0)
Lymphs Abs: 2.5 10*3/uL (ref 0.7–4.0)
Monocytes Absolute: 0.3 10*3/uL (ref 0.1–1.0)
Monocytes Absolute: 0.5 10*3/uL (ref 0.1–1.0)
Monocytes Relative: 8 % (ref 3–12)
Neutro Abs: 1.3 10*3/uL — ABNORMAL LOW (ref 1.7–7.7)
Neutro Abs: 2.5 10*3/uL (ref 1.7–7.7)
Neutro Abs: 5.9 10*3/uL (ref 1.7–7.7)
Neutrophils Relative %: 28 % — ABNORMAL LOW (ref 43–77)
Neutrophils Relative %: 62 % (ref 43–77)

## 2010-10-18 LAB — RAPID URINE DRUG SCREEN, HOSP PERFORMED
Amphetamines: NOT DETECTED
Barbiturates: NOT DETECTED
Benzodiazepines: POSITIVE — AB
Benzodiazepines: POSITIVE — AB
Cocaine: NOT DETECTED
Cocaine: NOT DETECTED
Opiates: NOT DETECTED
Opiates: POSITIVE — AB
Tetrahydrocannabinol: NOT DETECTED

## 2010-10-18 LAB — POCT CARDIAC MARKERS
CKMB, poc: 3.6 ng/mL (ref 1.0–8.0)
Myoglobin, poc: >500 ng/mL (ref 12–200)
Troponin i, poc: <0.05 ng/mL (ref 0.00–0.09)

## 2010-10-18 LAB — CBC
HCT: 27.3 % — ABNORMAL LOW (ref 39.0–52.0)
HCT: 29.1 % — ABNORMAL LOW (ref 39.0–52.0)
HCT: 30.4 % — ABNORMAL LOW (ref 39.0–52.0)
HCT: 33.2 % — ABNORMAL LOW (ref 39.0–52.0)
MCH: 29.4 pg (ref 26.0–34.0)
MCH: 29.9 pg (ref 26.0–34.0)
MCH: 30.1 pg (ref 26.0–34.0)
MCH: 31 pg (ref 26.0–34.0)
MCHC: 32.9 g/dL (ref 30.0–36.0)
MCHC: 33 g/dL (ref 30.0–36.0)
MCHC: 33 g/dL (ref 30.0–36.0)
MCHC: 33.7 g/dL (ref 30.0–36.0)
MCV: 89.9 fL (ref 78.0–100.0)
MCV: 90.6 fL (ref 78.0–100.0)
MCV: 91.3 fL (ref 78.0–100.0)
MCV: 92 fL (ref 78.0–100.0)
Platelets: 101 10*3/uL — ABNORMAL LOW (ref 150–400)
Platelets: 105 10*3/uL — ABNORMAL LOW (ref 150–400)
Platelets: 119 10*3/uL — ABNORMAL LOW (ref 150–400)
Platelets: 154 10*3/uL (ref 150–400)
RBC: 3.16 MIL/uL — ABNORMAL LOW (ref 4.22–5.81)
RBC: 3.58 MIL/uL — ABNORMAL LOW (ref 4.22–5.81)
RDW: 15.7 % — ABNORMAL HIGH (ref 11.5–15.5)
RDW: 15.8 % — ABNORMAL HIGH (ref 11.5–15.5)
RDW: 16 % — ABNORMAL HIGH (ref 11.5–15.5)
RDW: 16.3 % — ABNORMAL HIGH (ref 11.5–15.5)
RDW: 16 % — ABNORMAL HIGH (ref 11.5–15.5)
WBC: 4 10*3/uL (ref 4.0–10.5)
WBC: 4.5 10*3/uL (ref 4.0–10.5)
WBC: 8.5 10*3/uL (ref 4.0–10.5)
WBC: 6.5 10*3/uL (ref 4.0–10.5)

## 2010-10-18 LAB — BASIC METABOLIC PANEL
BUN: 22 mg/dL (ref 6–23)
BUN: 43 mg/dL — ABNORMAL HIGH (ref 6–23)
CO2: 16 mEq/L — ABNORMAL LOW (ref 19–32)
Chloride: 99 mEq/L (ref 96–112)
Chloride: 108 mEq/L (ref 96–112)
Creatinine, Ser: 4.9 mg/dL — ABNORMAL HIGH (ref 0.4–1.5)
GFR calc Af Amer: 15 mL/min — ABNORMAL LOW (ref >60)
GFR calc non Af Amer: 12 mL/min — ABNORMAL LOW (ref >60)
Glucose, Bld: 84 mg/dL (ref 70–99)
Potassium: 4.3 mEq/L (ref 3.5–5.1)
Potassium: 4.9 mEq/L (ref 3.5–5.1)

## 2010-10-18 LAB — RENAL FUNCTION PANEL
Albumin: 3.2 g/dL — ABNORMAL LOW (ref 3.5–5.2)
BUN: 61 mg/dL — ABNORMAL HIGH (ref 6–23)
CO2: 20 mEq/L (ref 19–32)
Calcium: 8.8 mg/dL (ref 8.4–10.5)
Creatinine, Ser: 5.08 mg/dL — ABNORMAL HIGH (ref 0.4–1.5)
Creatinine, Ser: 6.76 mg/dL — ABNORMAL HIGH (ref 0.4–1.5)
GFR calc Af Amer: 14 mL/min — ABNORMAL LOW (ref >60)
Glucose, Bld: 103 mg/dL — ABNORMAL HIGH (ref 70–99)
Glucose, Bld: 94 mg/dL (ref 70–99)
Phosphorus: 5 mg/dL — ABNORMAL HIGH (ref 2.3–4.6)
Phosphorus: 5.5 mg/dL — ABNORMAL HIGH (ref 2.3–4.6)
Sodium: 138 mEq/L (ref 135–145)

## 2010-10-18 LAB — PROTIME-INR: INR: 0.93 (ref 0.00–1.49)

## 2010-10-18 LAB — APTT: aPTT: 32 seconds (ref 24–37)

## 2010-10-18 LAB — GLUCOSE, CAPILLARY
Glucose-Capillary: 123 mg/dL — ABNORMAL HIGH (ref 70–99)
Glucose-Capillary: 131 mg/dL — ABNORMAL HIGH (ref 70–99)

## 2010-10-18 LAB — ETHANOL
Alcohol, Ethyl (B): 204 mg/dL — ABNORMAL HIGH (ref 0–10)
Alcohol, Ethyl (B): 340 mg/dL — ABNORMAL HIGH (ref 0–10)

## 2010-10-18 LAB — LIPASE, BLOOD: Lipase: 149 U/L — ABNORMAL HIGH (ref 11–59)

## 2010-10-20 LAB — POCT I-STAT 4, (NA,K, GLUC, HGB,HCT)
Glucose, Bld: 96 mg/dL (ref 70–99)
HCT: 45 % (ref 39.0–52.0)
Potassium: 3.9 mEq/L (ref 3.5–5.1)

## 2010-10-21 LAB — PROTIME-INR
INR: 1.03 (ref 0.00–1.49)
INR: 1.04 (ref 0.00–1.49)
Prothrombin Time: 13.4 seconds (ref 11.6–15.2)
Prothrombin Time: 13.5 seconds (ref 11.6–15.2)

## 2010-10-21 LAB — WOUND CULTURE

## 2010-10-21 LAB — CBC
HCT: 35.7 % — ABNORMAL LOW (ref 39.0–52.0)
HCT: 38.8 % — ABNORMAL LOW (ref 39.0–52.0)
Hemoglobin: 11.1 g/dL — ABNORMAL LOW (ref 13.0–17.0)
Hemoglobin: 11.3 g/dL — ABNORMAL LOW (ref 13.0–17.0)
Hemoglobin: 11.8 g/dL — ABNORMAL LOW (ref 13.0–17.0)
Hemoglobin: 12.2 g/dL — ABNORMAL LOW (ref 13.0–17.0)
Hemoglobin: 13 g/dL (ref 13.0–17.0)
Hemoglobin: 13.1 g/dL (ref 13.0–17.0)
MCH: 33 pg (ref 26.0–34.0)
MCHC: 33.6 g/dL (ref 30.0–36.0)
MCHC: 33.8 g/dL (ref 30.0–36.0)
MCHC: 33.8 g/dL (ref 30.0–36.0)
MCHC: 34.1 g/dL (ref 30.0–36.0)
MCHC: 34.2 g/dL (ref 30.0–36.0)
MCV: 97.3 fL (ref 78.0–100.0)
MCV: 97.3 fL (ref 78.0–100.0)
MCV: 97.3 fL (ref 78.0–100.0)
Platelets: 140 10*3/uL — ABNORMAL LOW (ref 150–400)
Platelets: 171 10*3/uL (ref 150–400)
RBC: 3.37 MIL/uL — ABNORMAL LOW (ref 4.22–5.81)
RBC: 3.45 MIL/uL — ABNORMAL LOW (ref 4.22–5.81)
RBC: 3.65 MIL/uL — ABNORMAL LOW (ref 4.22–5.81)
RBC: 3.79 MIL/uL — ABNORMAL LOW (ref 4.22–5.81)
RBC: 3.97 MIL/uL — ABNORMAL LOW (ref 4.22–5.81)
RBC: 3.98 MIL/uL — ABNORMAL LOW (ref 4.22–5.81)
RDW: 15.3 % (ref 11.5–15.5)
RDW: 15.8 % — ABNORMAL HIGH (ref 11.5–15.5)
WBC: 3.2 10*3/uL — ABNORMAL LOW (ref 4.0–10.5)
WBC: 3.7 10*3/uL — ABNORMAL LOW (ref 4.0–10.5)
WBC: 4.4 10*3/uL (ref 4.0–10.5)
WBC: 6.8 10*3/uL (ref 4.0–10.5)

## 2010-10-21 LAB — RENAL FUNCTION PANEL
Albumin: 2.8 g/dL — ABNORMAL LOW (ref 3.5–5.2)
Albumin: 3.2 g/dL — ABNORMAL LOW (ref 3.5–5.2)
BUN: 33 mg/dL — ABNORMAL HIGH (ref 6–23)
BUN: 36 mg/dL — ABNORMAL HIGH (ref 6–23)
CO2: 23 mEq/L (ref 19–32)
CO2: 25 mEq/L (ref 19–32)
Calcium: 9 mg/dL (ref 8.4–10.5)
Chloride: 100 mEq/L (ref 96–112)
Chloride: 101 mEq/L (ref 96–112)
Chloride: 96 mEq/L (ref 96–112)
Creatinine, Ser: 6.45 mg/dL — ABNORMAL HIGH (ref 0.4–1.5)
GFR calc Af Amer: 10 mL/min — ABNORMAL LOW (ref >60)
GFR calc Af Amer: 7 mL/min — ABNORMAL LOW (ref >60)
GFR calc non Af Amer: 6 mL/min — ABNORMAL LOW (ref >60)
GFR calc non Af Amer: 8 mL/min — ABNORMAL LOW (ref >60)
GFR calc non Af Amer: 9 mL/min — ABNORMAL LOW (ref >60)
Glucose, Bld: 116 mg/dL — ABNORMAL HIGH (ref 70–99)
Phosphorus: 4 mg/dL (ref 2.3–4.6)
Potassium: 4.3 mEq/L (ref 3.5–5.1)
Potassium: 4.4 mEq/L (ref 3.5–5.1)
Potassium: 4.5 mEq/L (ref 3.5–5.1)
Sodium: 135 mEq/L (ref 135–145)
Sodium: 135 mEq/L (ref 135–145)

## 2010-10-21 LAB — BASIC METABOLIC PANEL
BUN: 14 mg/dL (ref 6–23)
CO2: 23 mEq/L (ref 19–32)
Calcium: 8.6 mg/dL (ref 8.4–10.5)
Calcium: 8.8 mg/dL (ref 8.4–10.5)
Chloride: 102 mEq/L (ref 96–112)
Creatinine, Ser: 4.54 mg/dL — ABNORMAL HIGH (ref 0.4–1.5)
Creatinine, Ser: 5.1 mg/dL — ABNORMAL HIGH (ref 0.4–1.5)
Creatinine, Ser: 5.65 mg/dL — ABNORMAL HIGH (ref 0.4–1.5)
GFR calc Af Amer: 13 mL/min — ABNORMAL LOW (ref >60)
GFR calc Af Amer: 14 mL/min — ABNORMAL LOW (ref >60)
GFR calc Af Amer: 16 mL/min — ABNORMAL LOW (ref >60)
GFR calc non Af Amer: 14 mL/min — ABNORMAL LOW (ref >60)
Sodium: 135 mEq/L (ref 135–145)
Sodium: 135 mEq/L (ref 135–145)

## 2010-10-21 LAB — DIFFERENTIAL
Basophils Absolute: 0 10*3/uL (ref 0.0–0.1)
Basophils Relative: 0 % (ref 0–1)
Basophils Relative: 1 % (ref 0–1)
Eosinophils Absolute: 0 10*3/uL (ref 0.0–0.7)
Eosinophils Absolute: 0.1 10*3/uL (ref 0.0–0.7)
Eosinophils Absolute: 0.1 10*3/uL (ref 0.0–0.7)
Eosinophils Relative: 1 % (ref 0–5)
Eosinophils Relative: 2 % (ref 0–5)
Eosinophils Relative: 2 % (ref 0–5)
Lymphocytes Relative: 16 % (ref 12–46)
Lymphocytes Relative: 17 % (ref 12–46)
Lymphs Abs: 0.9 10*3/uL (ref 0.7–4.0)
Lymphs Abs: 1.1 10*3/uL (ref 0.7–4.0)
Monocytes Absolute: 0.5 10*3/uL (ref 0.1–1.0)
Monocytes Absolute: 0.7 10*3/uL (ref 0.1–1.0)
Monocytes Absolute: 0.8 10*3/uL (ref 0.1–1.0)
Monocytes Relative: 10 % (ref 3–12)
Monocytes Relative: 10 % (ref 3–12)
Monocytes Relative: 11 % (ref 3–12)
Monocytes Relative: 9 % (ref 3–12)
Neutro Abs: 4.9 10*3/uL (ref 1.7–7.7)
Neutrophils Relative %: 72 % (ref 43–77)
Neutrophils Relative %: 76 % (ref 43–77)

## 2010-10-21 LAB — FERRITIN: Ferritin: 724 ng/mL — ABNORMAL HIGH (ref 22–322)

## 2010-10-21 LAB — CULTURE, BLOOD (ROUTINE X 2): Culture: NO GROWTH

## 2010-10-21 LAB — RAPID URINE DRUG SCREEN, HOSP PERFORMED
Barbiturates: NOT DETECTED
Opiates: POSITIVE — AB
Tetrahydrocannabinol: NOT DETECTED

## 2010-10-21 LAB — RETICULOCYTES: Retic Count, Absolute: 21.1 10*3/uL (ref 19.0–186.0)

## 2010-10-21 LAB — IRON AND TIBC
Iron: 76 ug/dL (ref 42–135)
TIBC: 278 ug/dL (ref 215–435)
UIBC: 202 ug/dL

## 2010-10-21 LAB — ANAEROBIC CULTURE

## 2010-10-21 LAB — VITAMIN B12: Vitamin B-12: 485 pg/mL (ref 211–911)

## 2010-10-21 LAB — GLUCOSE, CAPILLARY: Glucose-Capillary: 121 mg/dL — ABNORMAL HIGH (ref 70–99)

## 2010-10-22 LAB — BASIC METABOLIC PANEL
BUN: 10 mg/dL (ref 6–23)
CO2: 26 mEq/L (ref 19–32)
Chloride: 102 mEq/L (ref 96–112)
Creatinine, Ser: 3.59 mg/dL — ABNORMAL HIGH (ref 0.4–1.5)
Glucose, Bld: 121 mg/dL — ABNORMAL HIGH (ref 70–99)

## 2010-10-22 LAB — RENAL FUNCTION PANEL
BUN: 38 mg/dL — ABNORMAL HIGH (ref 6–23)
CO2: 23 mEq/L (ref 19–32)
CO2: 23 mEq/L (ref 19–32)
Calcium: 8.3 mg/dL — ABNORMAL LOW (ref 8.4–10.5)
Calcium: 8.9 mg/dL (ref 8.4–10.5)
Chloride: 105 mEq/L (ref 96–112)
Creatinine, Ser: 6.46 mg/dL — ABNORMAL HIGH (ref 0.4–1.5)
GFR calc Af Amer: 11 mL/min — ABNORMAL LOW (ref >60)
GFR calc Af Amer: 14 mL/min — ABNORMAL LOW (ref >60)
GFR calc non Af Amer: 11 mL/min — ABNORMAL LOW (ref >60)
GFR calc non Af Amer: 9 mL/min — ABNORMAL LOW (ref >60)
Glucose, Bld: 123 mg/dL — ABNORMAL HIGH (ref 70–99)
Glucose, Bld: 152 mg/dL — ABNORMAL HIGH (ref 70–99)
Sodium: 137 mEq/L (ref 135–145)

## 2010-10-22 LAB — URINALYSIS, ROUTINE W REFLEX MICROSCOPIC
Bilirubin Urine: NEGATIVE
Nitrite: NEGATIVE
Protein, ur: 100 mg/dL — AB
Specific Gravity, Urine: 1.01 (ref 1.005–1.030)
Urobilinogen, UA: 0.2 mg/dL (ref 0.0–1.0)

## 2010-10-22 LAB — COMPREHENSIVE METABOLIC PANEL
ALT: 32 U/L (ref 0–53)
AST: 52 U/L — ABNORMAL HIGH (ref 0–37)
Albumin: 3.7 g/dL (ref 3.5–5.2)
Alkaline Phosphatase: 46 U/L (ref 39–117)
BUN: 25 mg/dL — ABNORMAL HIGH (ref 6–23)
Chloride: 102 mEq/L (ref 96–112)
GFR calc Af Amer: 12 mL/min — ABNORMAL LOW (ref >60)
Potassium: 3.9 mEq/L (ref 3.5–5.1)
Sodium: 138 mEq/L (ref 135–145)
Total Bilirubin: 0.8 mg/dL (ref 0.3–1.2)
Total Protein: 7.8 g/dL (ref 6.0–8.3)

## 2010-10-22 LAB — CBC
HCT: 31 % — ABNORMAL LOW (ref 39.0–52.0)
HCT: 32.3 % — ABNORMAL LOW (ref 39.0–52.0)
HCT: 32.3 % — ABNORMAL LOW (ref 39.0–52.0)
MCHC: 33.9 g/dL (ref 30.0–36.0)
MCHC: 34.3 g/dL (ref 30.0–36.0)
MCHC: 34.7 g/dL (ref 30.0–36.0)
MCV: 98.3 fL (ref 78.0–100.0)
MCV: 98.6 fL (ref 78.0–100.0)
MCV: 99.3 fL (ref 78.0–100.0)
Platelets: 161 10*3/uL (ref 150–400)
Platelets: 252 10*3/uL (ref 150–400)
RBC: 3.15 MIL/uL — ABNORMAL LOW (ref 4.22–5.81)
RBC: 3.26 MIL/uL — ABNORMAL LOW (ref 4.22–5.81)
RDW: 17.8 % — ABNORMAL HIGH (ref 11.5–15.5)
WBC: 4 10*3/uL (ref 4.0–10.5)
WBC: 7.4 10*3/uL (ref 4.0–10.5)

## 2010-10-22 LAB — POCT CARDIAC MARKERS
Myoglobin, poc: >500 ng/mL (ref 12–200)
Troponin i, poc: <0.05 ng/mL (ref 0.00–0.09)

## 2010-10-22 LAB — URINALYSIS, MICROSCOPIC ONLY
Bilirubin Urine: NEGATIVE
Nitrite: NEGATIVE
Specific Gravity, Urine: 1.011 (ref 1.005–1.030)
pH: 8 (ref 5.0–8.0)

## 2010-10-22 LAB — GLUCOSE, CAPILLARY
Glucose-Capillary: 109 mg/dL — ABNORMAL HIGH (ref 70–99)
Glucose-Capillary: 115 mg/dL — ABNORMAL HIGH (ref 70–99)
Glucose-Capillary: 124 mg/dL — ABNORMAL HIGH (ref 70–99)
Glucose-Capillary: 138 mg/dL — ABNORMAL HIGH (ref 70–99)

## 2010-10-22 LAB — APTT: aPTT: 35 seconds (ref 24–37)

## 2010-10-22 LAB — RAPID URINE DRUG SCREEN, HOSP PERFORMED
Amphetamines: NOT DETECTED
Cocaine: NOT DETECTED
Opiates: NOT DETECTED
Tetrahydrocannabinol: NOT DETECTED

## 2010-10-22 LAB — LIPID PANEL
HDL: 66 mg/dL (ref >39)
Triglycerides: 109 mg/dL (ref <150)
VLDL: 22 mg/dL (ref 0–40)

## 2010-10-22 LAB — URINE CULTURE

## 2010-10-22 LAB — DIFFERENTIAL
Basophils Relative: 1 % (ref 0–1)
Eosinophils Relative: 2 % (ref 0–5)
Monocytes Absolute: 0.6 10*3/uL (ref 0.1–1.0)
Monocytes Relative: 8 % (ref 3–12)
Neutro Abs: 4.4 10*3/uL (ref 1.7–7.7)

## 2010-10-22 LAB — HEPATITIS B SURFACE ANTIGEN: Hepatitis B Surface Ag: NEGATIVE

## 2010-10-22 LAB — URINE MICROSCOPIC-ADD ON

## 2010-10-22 LAB — IRON AND TIBC
Iron: 74 ug/dL (ref 42–135)
TIBC: 281 ug/dL (ref 215–435)

## 2010-10-22 LAB — TSH: TSH: 0.208 u[IU]/mL — ABNORMAL LOW (ref 0.350–4.500)

## 2010-10-22 LAB — ETHANOL: Alcohol, Ethyl (B): 398 mg/dL — ABNORMAL HIGH (ref 0–10)

## 2010-10-22 LAB — PROTIME-INR: INR: 1.07 (ref 0.00–1.49)

## 2010-10-22 LAB — RETICULOCYTES
RBC.: 3.3 MIL/uL — ABNORMAL LOW (ref 4.22–5.81)
Retic Count, Absolute: 56.1 10*3/uL (ref 19.0–186.0)

## 2010-10-22 LAB — MAGNESIUM: Magnesium: 2.1 mg/dL (ref 1.5–2.5)

## 2010-10-22 LAB — VITAMIN B12: Vitamin B-12: 304 pg/mL (ref 211–911)

## 2010-10-22 NOTE — Op Note (Signed)
[name], [name]               ACCOUNT NO.:  [id number]  MEDICAL RECORD NO.:  [id number]           PATIENT TYPE:  O  LOCATION:  SDSC                         FACILITY:  [facility]  PHYSICIAN:  Janetta Hora. Jamiracle Avants, MD  DATE OF BIRTH:  September 11, 1955  DATE OF PROCEDURE:  10/16/2010 DATE OF DISCHARGE:  10/16/2010                              OPERATIVE REPORT   PROCEDURE:  Left upper arm AV graft.  PREOPERATIVE DIAGNOSIS:  End-stage renal disease.  POSTOPERATIVE DIAGNOSIS:  End-stage renal disease.  ANESTHESIA:  General.  ASSISTANT:  Nurse.  OPERATIVE FINDINGS:  A 6-mm PTFE, end-to-end to axillary vein and end-to- side to brachial artery.  OPERATIVE DETAILS:  After obtaining informed consent, the patient was taken to the operating room.  The patient was placed in supine position on the operating room table.  After induction of general anesthesia, the patient's entire left upper extremity was prepped and draped in usual sterile fashion.  Next, a transverse incision was made through a pre- existing scar near the antecubital crease.  The patient had a preexisting forearm graft in this location.  Several centimeters of the forearm graft were dissected free circumferentially along the venous limb of the graft.  This was then transected on both sides and removed to allow further access to the brachial artery.  The incision was then deepened down to the level of brachial artery, and this was dissected free circumferentially.  There was a pre-existing brachiocephalic AV fistula coming off the brachial artery.  This was chronically occluded. The fistula was dissected free circumferentially several centimeters above the previous anastomosis and ligated distally with a 2-0 silk tie. The fistula was then transected, and the artery was fully mobilized for several centimeters to allow for a donor site for the proximal anastomosis.  There was good pulsatile flow through the brachial artery.  Next,  a longitudinal incision was made in the left axilla, carried down through subcutaneous tissues down to the level of axillary vein.  This was dissected free circumferentially.  The vein was of good quality, approximately 4-5 mm in diameter.  A subcutaneous tunnel was then created in an arcing configuration out of the biceps muscle, connecting the antecubital to the upper arm incision.  A 6-mm PTFE graft was then brought through this subcutaneous tunnel.  The patient was given 5000 units of intravenous heparin.  Brachial artery was controlled proximally and distally with vessel loops.  The fistula was completely taken down all the way to the level of the arterial anastomosis.  The artery was patent with good pulsatile flow.  Graft was slightly beveled on the proximal end and sewn end of graft to side of artery using a running 6-0 Prolene suture.  Just prior to completion of anastomosis, this was forebled and back bled and thoroughly flushed.  Anastomosis was secured, clamps released.  There was good pulsatile flow in the graft immediately.  The incision was packed with a 4 x 4 gauze to achieve hemostasis.  Graft was then pulled taut to length.  The distal axillary vein was ligated with a 2-0 silk tie and the vein controlled proximally with  a fine bulldog clamp.  The vein was then opened longitudinally and the graft cut to length and beveled and sewn end-to-end to the vein using a running 6-0 Prolene suture.  Just prior to completion of anastomosis, this was forebled and back bled and thoroughly flushed. Anastomosis was secured, clamps released.  There was a palpable thrill in the graft immediately.  There was also a good Doppler flow in the radial artery at the wrist level.  Next, hemostasis was obtained with some SNoW hemostatic material at the venous anastomosis.  This was then all thoroughly irrigated and removed.  Both incisions were then closed with a running 3-0 Vicryl subcutaneous  stitch.  The antecubital incision was closed with interrupted 3-0 nylon vertical mattress sutures because there was some tension on the skin due to the previous scar.  The axillary incision was closed with a 4-0 Vicryl subcuticular stitch.  The patient tolerated the procedure well, and there were no complications. Instrument, sponge, and needle counts were correct at the end of the case.  The patient was taken to recovery room in stable condition.     Janetta Hora. Dalin Caldera, MD     CEF/MEDQ  D:  10/17/2010  T:  10/18/2010  Job:  [id number]  Electronically Signed by Fabienne Bruns MD on 10/22/2010 09:05:12 AM

## 2010-10-25 ENCOUNTER — Ambulatory Visit (INDEPENDENT_AMBULATORY_CARE_PROVIDER_SITE_OTHER): Payer: Medicare Other | Admitting: Internal Medicine

## 2010-10-25 ENCOUNTER — Encounter: Payer: Self-pay | Admitting: Internal Medicine

## 2010-10-25 VITALS — BP 136/90 | HR 70 | Temp 97.8°F

## 2010-10-25 DIAGNOSIS — F1010 Alcohol abuse, uncomplicated: Secondary | ICD-10-CM

## 2010-10-25 DIAGNOSIS — R1084 Generalized abdominal pain: Secondary | ICD-10-CM

## 2010-10-25 DIAGNOSIS — K861 Other chronic pancreatitis: Secondary | ICD-10-CM

## 2010-10-25 DIAGNOSIS — I10 Essential (primary) hypertension: Secondary | ICD-10-CM

## 2010-10-25 LAB — CBC WITH DIFFERENTIAL/PLATELET
Basophils Absolute: 0 10*3/uL (ref 0.0–0.1)
Basophils Relative: 1 % (ref 0–1)
Eosinophils Absolute: 0.1 10*3/uL (ref 0.0–0.7)
Eosinophils Relative: 3 % (ref 0–5)
HCT: 37.6 % — ABNORMAL LOW (ref 39.0–52.0)
Hemoglobin: 11.9 g/dL — ABNORMAL LOW (ref 13.0–17.0)
MCH: 29.2 pg (ref 26.0–34.0)
MCHC: 31.6 g/dL (ref 30.0–36.0)
MCV: 92.4 fL (ref 78.0–100.0)
Monocytes Absolute: 0.5 10*3/uL (ref 0.1–1.0)
Monocytes Relative: 11 % (ref 3–12)
Neutrophils Relative %: 41 % — ABNORMAL LOW (ref 43–77)
RBC: 4.07 MIL/uL — ABNORMAL LOW (ref 4.22–5.81)
RDW: 18.5 % — ABNORMAL HIGH (ref 11.5–15.5)

## 2010-10-25 MED ORDER — OXYCODONE-ACETAMINOPHEN 5-325 MG PO TABS: 1.0000 | ORAL_TABLET | Freq: Four times a day (QID) | ORAL | Status: DC | PRN

## 2010-10-25 MED ORDER — CLONAZEPAM 0.5 MG PO TABS: 0.5000 mg | ORAL_TABLET | Freq: Two times a day (BID) | ORAL | Status: DC | PRN

## 2010-10-25 NOTE — Progress Notes (Signed)
Subjective:    Patient ID: [name], male    DOB: 07/08/1956, 55 y.o.   MRN: [id number]  HPI Patient is a 55 years old male with past medical history  as outlined here who comes to the Clinic for hospital f/u. He was admitted to the hospital on 2/23 to 10/02/2010 for left sided abdominal pain and he had left sided pleural effusion and had thoracentesis with drainage of 880 ml  Fluid, which may be due to pancreatitis or PNA. After discharge his pain still no significant improvement, constant, sharp, eating makes it worse and has to take percocet to control his pain, also associated nausea. He has no fever, chill, chest pain, shortness of breath, hemoptysis, vomiting, diarrhea, melena, dysuria, significant weight change. Denies recent smoking, alcohol or drug abuse. Has been taking all his medications as instructed except creon which he should take 3 caps instead of i cap tid.        Review of Systems Per HPI.  Current Outpatient Medications Current Outpatient Prescriptions  Medication Sig Dispense Refill  . albuterol (VENTOLIN HFA) 108 (90 BASE) MCG/ACT inhaler Inhale 2 puffs into the lungs every 6 (six) hours as needed.        Marland Kitchen amylase-lipase-protease (LIPRAM-CR10) 33.09-15-35.5 MU per capsule Take 3 capsules by mouth 3 (three) times daily with meals.        . B Complex-C-Folic Acid (RENA-VITE RX) 1 MG TABS Take 1 tablet by mouth daily.        . calcium acetate (PHOSLO) 667 MG capsule Take 667 mg by mouth 3 (three) times daily with meals.        . clonazePAM (KLONOPIN) 0.5 MG tablet Take 0.5 mg by mouth 2 (two) times daily as needed.        . folic acid (FOLVITE) 1 MG tablet Take 1 mg by mouth daily.        . Iron Sucrose (VENOFER IV) Inject into the vein.        Marland Kitchen omeprazole (PRILOSEC) 40 MG capsule Take 40 mg by mouth daily.        Marland Kitchen oxyCODONE-acetaminophen (PERCOCET) 5-325 MG per tablet Take 1 tablet by mouth every 6 (six) hours as needed.        . thiamine 100 MG tablet Take 100 mg  by mouth daily.        Marland Kitchen tiotropium (SPIRIVA) 18 MCG inhalation capsule Place 18 mcg into inhaler and inhale daily.        . traMADol (ULTRAM) 50 MG tablet Take 50 mg by mouth every 6 (six) hours as needed.          Allergies Review of patient's allergies indicates no known allergies.  Past Medical History  Diagnosis Date  . Pancytopenia      chronic  . Polysubstance abuse     chronic most notable for alcohol  . End-stage renal disease on hemodialysis   . Malignant hypertension   . Hepatitis C   . COPD (chronic obstructive pulmonary disease)   . Chronic recurrent pancreatitis     likely secondary to alcoholism  . Smoker   . Alcohol abuse   . Respiratory failure Jan 2012    Hx of VDRF   . Alcohol withdrawal   . Pancreatitis, alcoholic     No past surgical history on file.   Allergies NKDA    Objective:   Physical Exam General: Vital signs reviewed and noted. Well-developed,well-nourished,in acute distress; alert,appropriate and cooperative throughout examination. Head: normocephalic,  atraumatic. Neck: No deformities, masses, or tenderness noted. Lungs: Normal respiratory effort. Clear to auscultation BL without crackles or wheezes.  Heart: RRR. S1 and S2 normal without gallop, murmur, or rubs.  Abdomen: BS normoactive. Soft, Nondistended, mid abdominal tenderness, no rebound.  No masses or organomegaly. Extremities: No pretibial edema.         Assessment & Plan:

## 2010-10-25 NOTE — Assessment & Plan Note (Addendum)
This is likely due to alcohol. Will check alcohol and lipase level and CMET. Asked him to take creon as instructed.   Addendum: The lab shows slight increase of lipase and K6.1 and ETOH 24. I talked with him on phone and he said he just finished HD on 10/26/2010 and has not drunken alcohol for 1 month. He said his abdominal pain also better and tolerates food. So addressed him not to drink any alcohol and use creon as instructed. He understands and appreciate our call and discussion. I also talked with attending Dr. Rogelia Boga and no need for admission for now.

## 2010-10-25 NOTE — Assessment & Plan Note (Signed)
He said he has quitted for several weeks, no recent use. Will check alcohol level for his abdominal pain.

## 2010-10-25 NOTE — Assessment & Plan Note (Addendum)
His left abdominal pain no change w/o percocet. The cause is unclear, likely due to pancreatitis, but recent PNA makes this is also possible as well as drug seeking?, PUD is also in our consideration, although the location of his pain is not epigastric. Will check CMET, CBC, alcohol, lipase and CXR. UDS can not be done as he is on HD, no urine. Also asked him to take creon as instructed. Will refill percocet for him and continue PPI.

## 2010-10-25 NOTE — Patient Instructions (Signed)
Please take all your medications as instructed in your instructions.   We will call you if any abnormal lab results.  Please call the Clinic for appointment or go to Emergency Department if your symptoms do not improve or get worse.        

## 2010-10-25 NOTE — Assessment & Plan Note (Signed)
Lab Results  Component Value Date   NA 143 10/16/2010   K 4.5 10/16/2010   CL 100 10/02/2010   CO2 22 10/02/2010   BUN 39* 10/02/2010   CREATININE 9.68* 10/02/2010   CREATININE 10.01* 09/27/2010    BP Readings from Last 3 Encounters:  10/25/10 136/90  09/27/10 174/106  06/12/10 146/91    Well controlled and not on medications, but on HD.

## 2010-10-26 LAB — COMPREHENSIVE METABOLIC PANEL
ALT: 51 U/L (ref 0–53)
AST: 69 U/L — ABNORMAL HIGH (ref 0–37)
CO2: 17 mEq/L — ABNORMAL LOW (ref 19–32)
Creat: 7.32 mg/dL — ABNORMAL HIGH (ref 0.40–1.50)
Glucose, Bld: 69 mg/dL — ABNORMAL LOW (ref 70–99)
Sodium: 137 mEq/L (ref 135–145)
Total Bilirubin: 0.5 mg/dL (ref 0.3–1.2)
Total Protein: 8 g/dL (ref 6.0–8.3)

## 2010-10-30 ENCOUNTER — Encounter (INDEPENDENT_AMBULATORY_CARE_PROVIDER_SITE_OTHER): Payer: Medicare Other | Admitting: Vascular Surgery

## 2010-10-30 ENCOUNTER — Encounter (INDEPENDENT_AMBULATORY_CARE_PROVIDER_SITE_OTHER): Payer: Medicare Other

## 2010-10-30 DIAGNOSIS — I70219 Atherosclerosis of native arteries of extremities with intermittent claudication, unspecified extremity: Secondary | ICD-10-CM

## 2010-10-30 NOTE — Assessment & Plan Note (Signed)
OFFICE VISIT  [name], [name] DOB:  07/22/1956                                       10/30/2010 [id number]#:[id number]  This is a patient well-known to our practice, having had a left upper arm AV graft inserted by Dr. Darrick [name] about 2 weeks ago.  He was referred by Dr. Arrie [name] for pain in the left foot to rule out arterial insufficiency.  The patient states he has a remote history of gout in the right foot but has never had pain quite like this in the left foot. He does have a possible history of neuropathy and has been taking Neurontin recently with no improvement.  He has had no chills, fever, or trauma to the foot.  CHRONIC MEDICAL PROBLEMS: 1. End-stage renal disease, on dialysis Monday, Wednesday, Friday. 2. COPD. 3. Hypertension, not currently on medication. 4. History of gout.  SOCIAL HISTORY:  He is single.  Smokes a half pack of cigarettes per day.  He is trying to quit.  Does use alcohol.  Has a history of drug abuse.  PHYSICAL EXAMINATION:  On physical exam today, blood pressure is 139/81, heart rate is 93, respirations 14.  He has a functioning AV graft in the left upper arm with well-healed incisions.  No evidence of steal on the left hand.  Lower extremities have 3+ femoral, popliteal, and dorsalis pedis pulses palpable bilaterally with no evidence of ischemia or gangrene.  He has tenderness to the left foot and ankle area which seems out of proportion to the physical findings.  There is no tenderness in the right foot.  There is some minimal erythema on the dorsum of the left foot.  No abrasions or ulcerations are noted.  Today I ordered lower extremity arterial Dopplers which were totally normal in both lower extremities with triphasic flow and ABIs greater than 1.0.  I do not know the etiology of his pain, but it is clearly not arterial insufficiency.  This patient has normal arterial findings and no evidence of venous  insufficiency on physical exam.  He will return to see Dr.  Arrie [name] for further evaluation.    [name], M.D. Electronically Signed  JDL/MEDQ  D:  10/30/2010  T:  10/30/2010  Job:  [id number]  cc:   Dr. Arrie [name]

## 2010-11-06 ENCOUNTER — Ambulatory Visit: Payer: Medicare Other | Admitting: Vascular Surgery

## 2010-11-07 LAB — CBC
MCHC: 34.9 g/dL (ref 30.0–36.0)
MCV: 84.4 fL (ref 78.0–100.0)
Platelets: 124 10*3/uL — ABNORMAL LOW (ref 150–400)
RBC: 3.27 MIL/uL — ABNORMAL LOW (ref 4.22–5.81)
WBC: 3 10*3/uL — ABNORMAL LOW (ref 4.0–10.5)

## 2010-11-07 LAB — POTASSIUM: Potassium: 4.4 mEq/L (ref 3.5–5.1)

## 2010-11-11 LAB — POCT I-STAT 4, (NA,K, GLUC, HGB,HCT)
Glucose, Bld: 88 mg/dL (ref 70–99)
HCT: 35 % — ABNORMAL LOW (ref 39.0–52.0)
Sodium: 140 mEq/L (ref 135–145)

## 2010-11-12 LAB — COMPREHENSIVE METABOLIC PANEL
ALT: 17 U/L (ref 0–53)
ALT: 19 U/L (ref 0–53)
ALT: 33 U/L (ref 0–53)
ALT: 37 U/L (ref 0–53)
ALT: 49 U/L (ref 0–53)
ALT: 49 U/L (ref 0–53)
AST: 22 U/L (ref 0–37)
AST: 24 U/L (ref 0–37)
AST: 34 U/L (ref 0–37)
AST: 39 U/L — ABNORMAL HIGH (ref 0–37)
AST: 62 U/L — ABNORMAL HIGH (ref 0–37)
AST: 62 U/L — ABNORMAL HIGH (ref 0–37)
Albumin: 2.3 g/dL — ABNORMAL LOW (ref 3.5–5.2)
Albumin: 2.3 g/dL — ABNORMAL LOW (ref 3.5–5.2)
Albumin: 2.3 g/dL — ABNORMAL LOW (ref 3.5–5.2)
Alkaline Phosphatase: 54 U/L (ref 39–117)
Alkaline Phosphatase: 61 U/L (ref 39–117)
Alkaline Phosphatase: 67 U/L (ref 39–117)
Alkaline Phosphatase: 78 U/L (ref 39–117)
Alkaline Phosphatase: 61 U/L (ref 39–117)
Alkaline Phosphatase: 68 U/L (ref 39–117)
Alkaline Phosphatase: 60 U/L (ref 39–117)
BUN: 38 mg/dL — ABNORMAL HIGH (ref 6–23)
BUN: 42 mg/dL — ABNORMAL HIGH (ref 6–23)
BUN: 44 mg/dL — ABNORMAL HIGH (ref 6–23)
BUN: 55 mg/dL — ABNORMAL HIGH (ref 6–23)
CO2: 18 mEq/L — ABNORMAL LOW (ref 19–32)
CO2: 23 mEq/L (ref 19–32)
CO2: 19 mEq/L (ref 19–32)
CO2: 19 mEq/L (ref 19–32)
CO2: 22 mEq/L (ref 19–32)
CO2: 22 mEq/L (ref 19–32)
CO2: 17 mEq/L — ABNORMAL LOW (ref 19–32)
Calcium: 7.6 mg/dL — ABNORMAL LOW (ref 8.4–10.5)
Calcium: 7.9 mg/dL — ABNORMAL LOW (ref 8.4–10.5)
Calcium: 8 mg/dL — ABNORMAL LOW (ref 8.4–10.5)
Calcium: 7.1 mg/dL — ABNORMAL LOW (ref 8.4–10.5)
Calcium: 7.7 mg/dL — ABNORMAL LOW (ref 8.4–10.5)
Chloride: 107 mEq/L (ref 96–112)
Chloride: 113 mEq/L — ABNORMAL HIGH (ref 96–112)
Chloride: 108 mEq/L (ref 96–112)
Chloride: 110 mEq/L (ref 96–112)
Chloride: 112 mEq/L (ref 96–112)
Chloride: 114 mEq/L — ABNORMAL HIGH (ref 96–112)
Chloride: 114 mEq/L — ABNORMAL HIGH (ref 96–112)
Chloride: 118 mEq/L — ABNORMAL HIGH (ref 96–112)
Creatinine, Ser: 3.89 mg/dL — ABNORMAL HIGH (ref 0.4–1.5)
Creatinine, Ser: 3.97 mg/dL — ABNORMAL HIGH (ref 0.4–1.5)
Creatinine, Ser: 4.01 mg/dL — ABNORMAL HIGH (ref 0.4–1.5)
Creatinine, Ser: 4.61 mg/dL — ABNORMAL HIGH (ref 0.4–1.5)
GFR calc Af Amer: 17 mL/min — ABNORMAL LOW (ref >60)
GFR calc Af Amer: 20 mL/min — ABNORMAL LOW (ref >60)
GFR calc Af Amer: 16 mL/min — ABNORMAL LOW (ref >60)
GFR calc non Af Amer: 12 mL/min — ABNORMAL LOW (ref >60)
GFR calc non Af Amer: 14 mL/min — ABNORMAL LOW (ref >60)
GFR calc non Af Amer: 16 mL/min — ABNORMAL LOW (ref >60)
GFR calc non Af Amer: 16 mL/min — ABNORMAL LOW (ref >60)
GFR calc non Af Amer: 16 mL/min — ABNORMAL LOW (ref >60)
GFR calc non Af Amer: 13 mL/min — ABNORMAL LOW (ref >60)
Glucose, Bld: 109 mg/dL — ABNORMAL HIGH (ref 70–99)
Glucose, Bld: 118 mg/dL — ABNORMAL HIGH (ref 70–99)
Glucose, Bld: 87 mg/dL (ref 70–99)
Potassium: 5 mEq/L (ref 3.5–5.1)
Potassium: 5.5 mEq/L — ABNORMAL HIGH (ref 3.5–5.1)
Potassium: 5.5 mEq/L — ABNORMAL HIGH (ref 3.5–5.1)
Potassium: 5.8 mEq/L — ABNORMAL HIGH (ref 3.5–5.1)
Potassium: 4.9 mEq/L (ref 3.5–5.1)
Potassium: 5.2 mEq/L — ABNORMAL HIGH (ref 3.5–5.1)
Potassium: 5 mEq/L (ref 3.5–5.1)
Sodium: 136 mEq/L (ref 135–145)
Sodium: 138 mEq/L (ref 135–145)
Sodium: 139 mEq/L (ref 135–145)
Sodium: 138 mEq/L (ref 135–145)
Sodium: 140 mEq/L (ref 135–145)
Total Bilirubin: 0.4 mg/dL (ref 0.3–1.2)
Total Bilirubin: 0.5 mg/dL (ref 0.3–1.2)
Total Bilirubin: 0.3 mg/dL (ref 0.3–1.2)
Total Bilirubin: 0.4 mg/dL (ref 0.3–1.2)
Total Bilirubin: 0.4 mg/dL (ref 0.3–1.2)
Total Bilirubin: 0.5 mg/dL (ref 0.3–1.2)
Total Bilirubin: 0.3 mg/dL (ref 0.3–1.2)
Total Protein: 5.8 g/dL — ABNORMAL LOW (ref 6.0–8.3)
Total Protein: 6.7 g/dL (ref 6.0–8.3)
Total Protein: 5.4 g/dL — ABNORMAL LOW (ref 6.0–8.3)

## 2010-11-12 LAB — DIFFERENTIAL
Basophils Absolute: 0 10*3/uL (ref 0.0–0.1)
Basophils Absolute: 0.1 10*3/uL (ref 0.0–0.1)
Basophils Relative: 1 % (ref 0–1)
Basophils Relative: 1 % (ref 0–1)
Basophils Relative: 2 % — ABNORMAL HIGH (ref 0–1)
Eosinophils Absolute: 0.1 10*3/uL (ref 0.0–0.7)
Eosinophils Absolute: 0.2 10*3/uL (ref 0.0–0.7)
Eosinophils Absolute: 0.2 10*3/uL (ref 0.0–0.7)
Eosinophils Absolute: 0.1 10*3/uL (ref 0.0–0.7)
Eosinophils Relative: 4 % (ref 0–5)
Eosinophils Relative: 9 % — ABNORMAL HIGH (ref 0–5)
Eosinophils Relative: 5 % (ref 0–5)
Eosinophils Relative: 3 % (ref 0–5)
Lymphocytes Relative: 57 % — ABNORMAL HIGH (ref 12–46)
Lymphocytes Relative: 59 % — ABNORMAL HIGH (ref 12–46)
Lymphs Abs: 1.2 10*3/uL (ref 0.7–4.0)
Lymphs Abs: 1.8 10*3/uL (ref 0.7–4.0)
Monocytes Absolute: 0.3 10*3/uL (ref 0.1–1.0)
Monocytes Relative: 15 % — ABNORMAL HIGH (ref 3–12)
Neutro Abs: 0.5 10*3/uL — ABNORMAL LOW (ref 1.7–7.7)
Neutrophils Relative %: 46 % (ref 43–77)

## 2010-11-12 LAB — URINALYSIS, ROUTINE W REFLEX MICROSCOPIC
Bilirubin Urine: NEGATIVE
Bilirubin Urine: NEGATIVE
Bilirubin Urine: NEGATIVE
Bilirubin Urine: NEGATIVE
Glucose, UA: NEGATIVE mg/dL
Glucose, UA: NEGATIVE mg/dL
Ketones, ur: NEGATIVE mg/dL
Leukocytes, UA: NEGATIVE
Nitrite: NEGATIVE
Nitrite: NEGATIVE
Protein, ur: >300 mg/dL — AB
Specific Gravity, Urine: 1.007 (ref 1.005–1.030)
Specific Gravity, Urine: 1.009 (ref 1.005–1.030)
Specific Gravity, Urine: 1.007 (ref 1.005–1.030)
Urobilinogen, UA: 0.2 mg/dL (ref 0.0–1.0)
pH: 5.5 (ref 5.0–8.0)
pH: 5.5 (ref 5.0–8.0)

## 2010-11-12 LAB — CBC
HCT: 26.3 % — ABNORMAL LOW (ref 39.0–52.0)
HCT: 26.5 % — ABNORMAL LOW (ref 39.0–52.0)
HCT: 25.8 % — ABNORMAL LOW (ref 39.0–52.0)
HCT: 25.8 % — ABNORMAL LOW (ref 39.0–52.0)
HCT: 28.3 % — ABNORMAL LOW (ref 39.0–52.0)
Hemoglobin: 8.4 g/dL — ABNORMAL LOW (ref 13.0–17.0)
Hemoglobin: 9.6 g/dL — ABNORMAL LOW (ref 13.0–17.0)
MCHC: 34.2 g/dL (ref 30.0–36.0)
MCHC: 34.5 g/dL (ref 30.0–36.0)
MCHC: 35 g/dL (ref 30.0–36.0)
MCHC: 34.2 g/dL (ref 30.0–36.0)
MCHC: 34.3 g/dL (ref 30.0–36.0)
MCHC: 34.4 g/dL (ref 30.0–36.0)
MCHC: 35 g/dL (ref 30.0–36.0)
MCV: 88.2 fL (ref 78.0–100.0)
MCV: 88.4 fL (ref 78.0–100.0)
MCV: 87.7 fL (ref 78.0–100.0)
MCV: 87.7 fL (ref 78.0–100.0)
MCV: 88 fL (ref 78.0–100.0)
MCV: 88 fL (ref 78.0–100.0)
MCV: 86.3 fL (ref 78.0–100.0)
Platelets: 126 10*3/uL — ABNORMAL LOW (ref 150–400)
Platelets: 128 10*3/uL — ABNORMAL LOW (ref 150–400)
Platelets: 133 10*3/uL — ABNORMAL LOW (ref 150–400)
Platelets: 135 10*3/uL — ABNORMAL LOW (ref 150–400)
Platelets: 136 10*3/uL — ABNORMAL LOW (ref 150–400)
Platelets: 141 10*3/uL — ABNORMAL LOW (ref 150–400)
Platelets: 110 10*3/uL — ABNORMAL LOW (ref 150–400)
Platelets: 113 10*3/uL — ABNORMAL LOW (ref 150–400)
RBC: 2.75 MIL/uL — ABNORMAL LOW (ref 4.22–5.81)
RBC: 3.37 MIL/uL — ABNORMAL LOW (ref 4.22–5.81)
RBC: 2.84 MIL/uL — ABNORMAL LOW (ref 4.22–5.81)
RBC: 3.18 MIL/uL — ABNORMAL LOW (ref 4.22–5.81)
RBC: 3.23 MIL/uL — ABNORMAL LOW (ref 4.22–5.81)
RBC: 3.26 MIL/uL — ABNORMAL LOW (ref 4.22–5.81)
RDW: 16.4 % — ABNORMAL HIGH (ref 11.5–15.5)
RDW: 16.8 % — ABNORMAL HIGH (ref 11.5–15.5)
RDW: 17.6 % — ABNORMAL HIGH (ref 11.5–15.5)
RDW: 17.7 % — ABNORMAL HIGH (ref 11.5–15.5)
RDW: 16.6 % — ABNORMAL HIGH (ref 11.5–15.5)
WBC: 2.2 10*3/uL — ABNORMAL LOW (ref 4.0–10.5)
WBC: 2.3 10*3/uL — ABNORMAL LOW (ref 4.0–10.5)
WBC: 2.5 10*3/uL — ABNORMAL LOW (ref 4.0–10.5)
WBC: 2.6 10*3/uL — ABNORMAL LOW (ref 4.0–10.5)
WBC: 3 10*3/uL — ABNORMAL LOW (ref 4.0–10.5)
WBC: 3 10*3/uL — ABNORMAL LOW (ref 4.0–10.5)
WBC: 3.1 10*3/uL — ABNORMAL LOW (ref 4.0–10.5)
WBC: 3.3 10*3/uL — ABNORMAL LOW (ref 4.0–10.5)
WBC: 4.7 10*3/uL (ref 4.0–10.5)
WBC: 4.9 10*3/uL (ref 4.0–10.5)

## 2010-11-12 LAB — BASIC METABOLIC PANEL
BUN: 37 mg/dL — ABNORMAL HIGH (ref 6–23)
BUN: 47 mg/dL — ABNORMAL HIGH (ref 6–23)
BUN: 58 mg/dL — ABNORMAL HIGH (ref 6–23)
CO2: 20 mEq/L (ref 19–32)
Calcium: 7.7 mg/dL — ABNORMAL LOW (ref 8.4–10.5)
Chloride: 112 mEq/L (ref 96–112)
Chloride: 114 mEq/L — ABNORMAL HIGH (ref 96–112)
Creatinine, Ser: 3.9 mg/dL — ABNORMAL HIGH (ref 0.4–1.5)
Creatinine, Ser: 5.25 mg/dL — ABNORMAL HIGH (ref 0.4–1.5)
Creatinine, Ser: 4.26 mg/dL — ABNORMAL HIGH (ref 0.4–1.5)
GFR calc non Af Amer: 11 mL/min — ABNORMAL LOW (ref >60)
Glucose, Bld: 103 mg/dL — ABNORMAL HIGH (ref 70–99)
Glucose, Bld: 111 mg/dL — ABNORMAL HIGH (ref 70–99)
Glucose, Bld: 78 mg/dL (ref 70–99)
Potassium: 6.4 mEq/L (ref 3.5–5.1)

## 2010-11-12 LAB — CARDIAC PANEL(CRET KIN+CKTOT+MB+TROPI)
CK, MB: 2.5 ng/mL (ref 0.3–4.0)
CK, MB: 2.8 ng/mL (ref 0.3–4.0)
Relative Index: 1.9 (ref 0.0–2.5)
Relative Index: 2.1 (ref 0.0–2.5)
Total CK: 133 U/L (ref 7–232)
Total CK: 139 U/L (ref 7–232)
Troponin I: 0.01 ng/mL (ref 0.00–0.06)
Troponin I: 0.01 ng/mL (ref 0.00–0.06)
Troponin I: 0.01 ng/mL (ref 0.00–0.06)
Troponin I: 0.02 ng/mL (ref 0.00–0.06)

## 2010-11-12 LAB — RENAL FUNCTION PANEL
Albumin: 2.4 g/dL — ABNORMAL LOW (ref 3.5–5.2)
BUN: 47 mg/dL — ABNORMAL HIGH (ref 6–23)
BUN: 44 mg/dL — ABNORMAL HIGH (ref 6–23)
CO2: 21 mEq/L (ref 19–32)
Calcium: 7.3 mg/dL — ABNORMAL LOW (ref 8.4–10.5)
Calcium: 7.5 mg/dL — ABNORMAL LOW (ref 8.4–10.5)
Chloride: 110 mEq/L (ref 96–112)
Creatinine, Ser: 5.28 mg/dL — ABNORMAL HIGH (ref 0.4–1.5)
Creatinine, Ser: 5.35 mg/dL — ABNORMAL HIGH (ref 0.4–1.5)
Creatinine, Ser: 3.8 mg/dL — ABNORMAL HIGH (ref 0.4–1.5)
GFR calc Af Amer: 14 mL/min — ABNORMAL LOW (ref >60)
GFR calc non Af Amer: 11 mL/min — ABNORMAL LOW (ref >60)
Glucose, Bld: 103 mg/dL — ABNORMAL HIGH (ref 70–99)
Phosphorus: 6 mg/dL — ABNORMAL HIGH (ref 2.3–4.6)
Sodium: 136 mEq/L (ref 135–145)

## 2010-11-12 LAB — URINE MICROSCOPIC-ADD ON

## 2010-11-12 LAB — RAPID URINE DRUG SCREEN, HOSP PERFORMED
Amphetamines: NOT DETECTED
Amphetamines: NOT DETECTED
Barbiturates: NOT DETECTED
Cocaine: NOT DETECTED
Cocaine: NOT DETECTED
Opiates: NOT DETECTED
Opiates: NOT DETECTED
Tetrahydrocannabinol: NOT DETECTED
Tetrahydrocannabinol: NOT DETECTED

## 2010-11-12 LAB — PROTEIN ELECTROPH W RFLX QUANT IMMUNOGLOBULINS
Albumin ELP: 46.5 % — ABNORMAL LOW (ref 55.8–66.1)
Alpha-1-Globulin: 5.2 % — ABNORMAL HIGH (ref 2.9–4.9)
Beta 2: 5.3 % (ref 3.2–6.5)
Gamma Globulin: 20.1 % — ABNORMAL HIGH (ref 11.1–18.8)
M-Spike, %: NOT DETECTED g/dL

## 2010-11-12 LAB — LACTIC ACID, PLASMA: Lactic Acid, Venous: 1 mmol/L (ref 0.5–2.2)

## 2010-11-12 LAB — VITAMIN B12: Vitamin B-12: 312 pg/mL (ref 211–911)

## 2010-11-12 LAB — ETHANOL
Alcohol, Ethyl (B): 61 mg/dL — ABNORMAL HIGH (ref 0–10)
Alcohol, Ethyl (B): 171 mg/dL — ABNORMAL HIGH (ref 0–10)

## 2010-11-12 LAB — TSH: TSH: 5.02 u[IU]/mL — ABNORMAL HIGH (ref 0.350–4.500)

## 2010-11-12 LAB — ABO/RH: ABO/RH(D): O NEG

## 2010-11-12 LAB — POCT CARDIAC MARKERS
CKMB, poc: 2.6 ng/mL (ref 1.0–8.0)
Troponin i, poc: <0.05 ng/mL (ref 0.00–0.09)

## 2010-11-12 LAB — FOLATE RBC: RBC Folate: 1071 ng/mL — ABNORMAL HIGH (ref 180–600)

## 2010-11-12 LAB — PROTIME-INR
INR: 1 (ref 0.00–1.49)
Prothrombin Time: 13.1 seconds (ref 11.6–15.2)

## 2010-11-12 LAB — IRON AND TIBC
Saturation Ratios: 54 % (ref 20–55)
TIBC: 270 ug/dL (ref 215–435)

## 2010-11-12 LAB — POTASSIUM: Potassium: 5.5 mEq/L — ABNORMAL HIGH (ref 3.5–5.1)

## 2010-11-12 LAB — PTH, INTACT AND CALCIUM: PTH: 300.6 pg/mL — ABNORMAL HIGH (ref 14.0–72.0)

## 2010-11-12 LAB — URINE CULTURE

## 2010-11-14 IMAGING — CR DG CHEST 2V
2 series · 2 of 2 positions shown · non-contrast
Comparison: 05/09/2010, earlier the same day

CLINICAL DATA: Chest pain.  Shortness of breath.  Question
pneumothorax on plain film earlier today.

CHEST - 2 VIEW

[w chest pa]
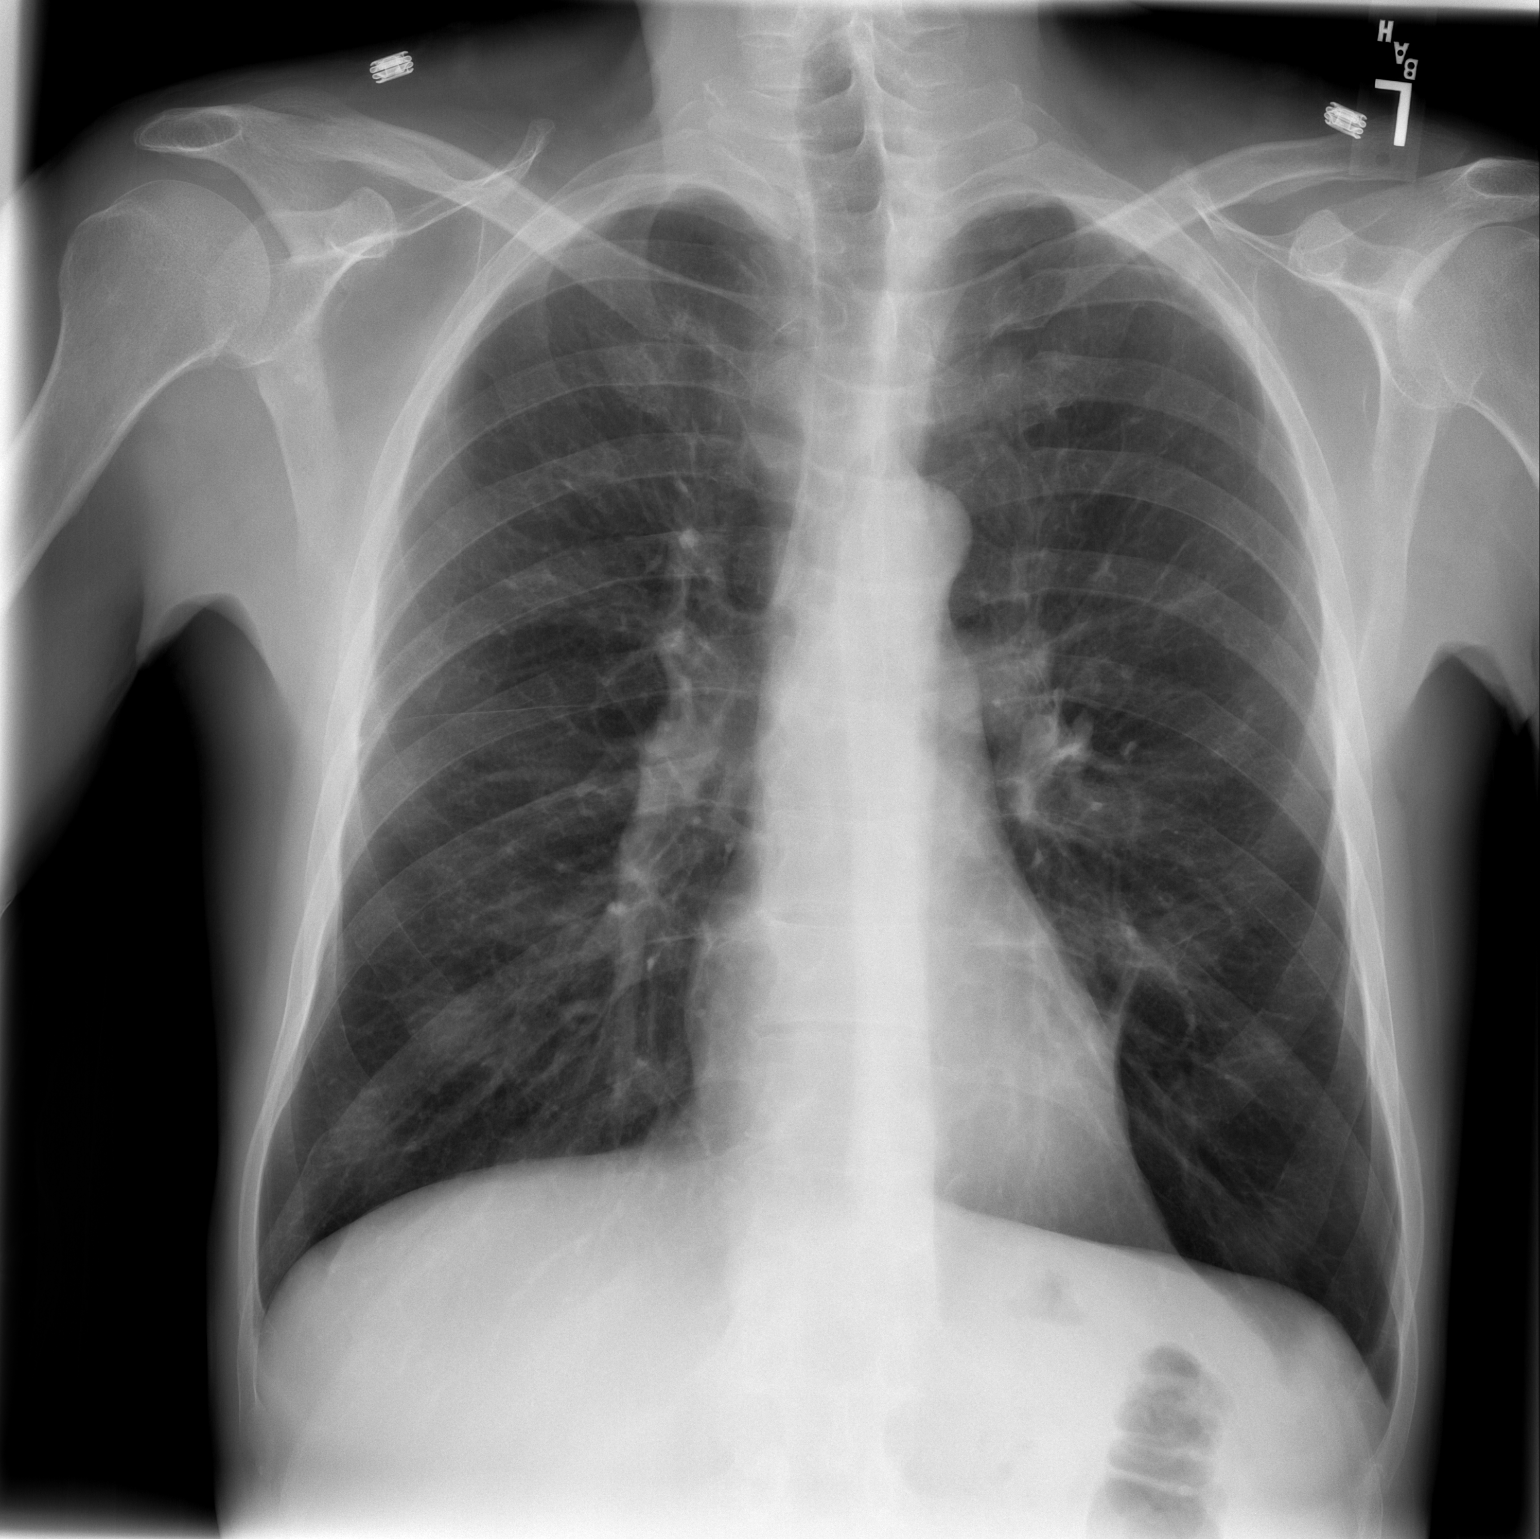

[w chest lat]
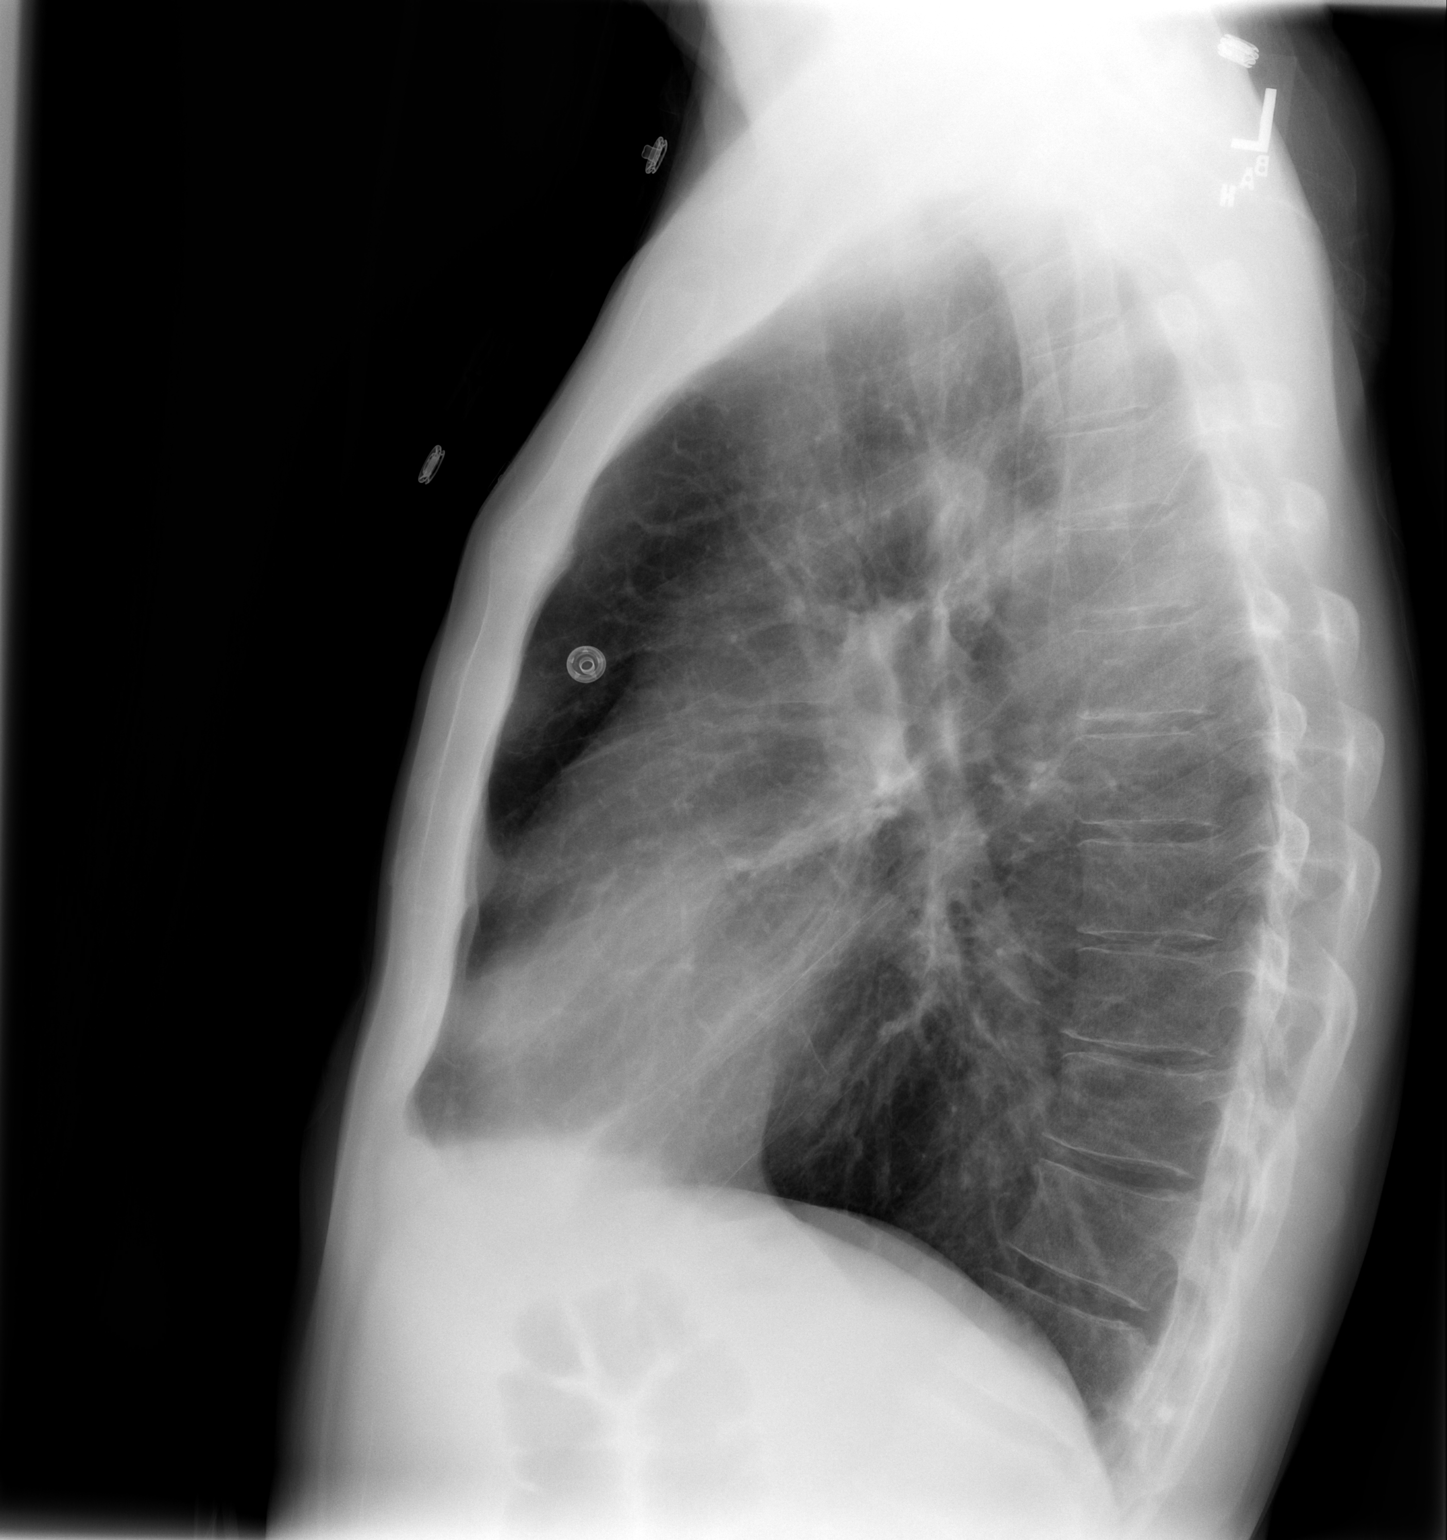

[2 of 2 positions shown; findings below may reference images not displayed]

FINDINGS: Lungs are hyperexpanded without focal consolidation or
pulmonary edema.  No evidence for a pleural line on the current
study to suggest pneumothorax.  The appearance on the previous
study may have been related to a skin fold.  Cortical irregularity
along the anterior right second rib may be related to nondisplaced
fracture, although this is not a definite finding. The
cardiopericardial silhouette is within normal limits for size.
IMPRESSION: Emphysema.

No definite right pneumothorax on this two-view exam.

Cortical irregularity along the right second rib raises a question
of nondisplaced fracture.

## 2010-11-14 IMAGING — CR DG ABDOMEN ACUTE W/ 1V CHEST
3 series · 3 of 3 positions shown · non-contrast
Comparison: 06/16/2008 and earlier.

CLINICAL DATA: 54-year-old male with abdominal pain epigastric
region.  Shortness of breath.

ACUTE ABDOMEN SERIES (ABDOMEN 2 VIEW & CHEST 1 VIEW)

[w chest pa]
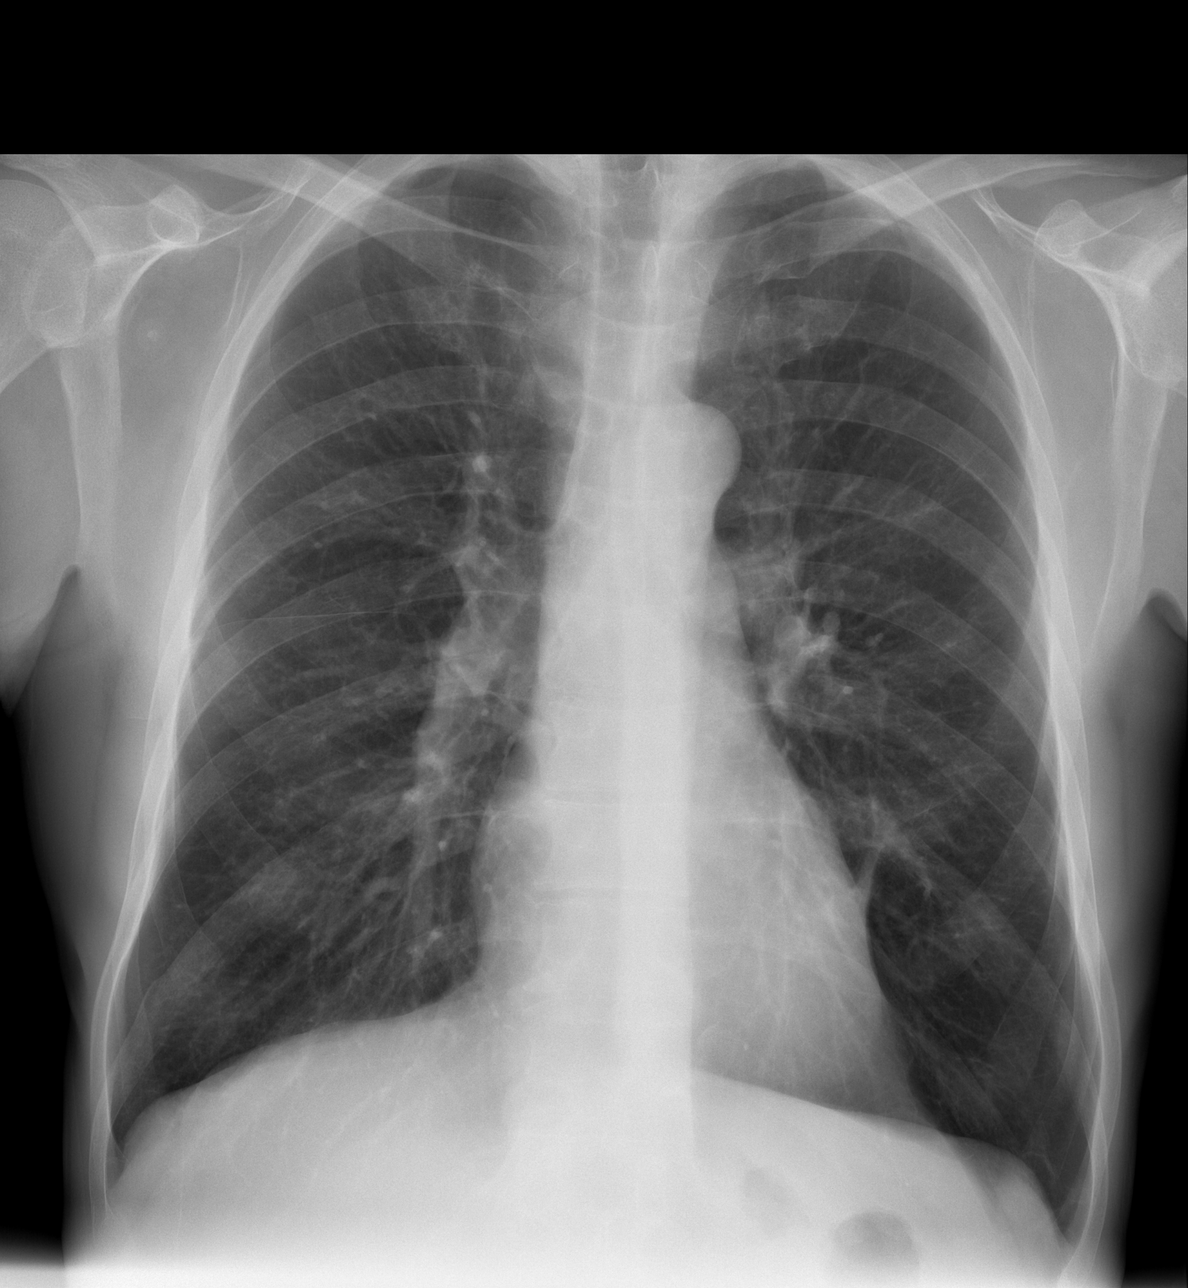

[w abdomen upright]
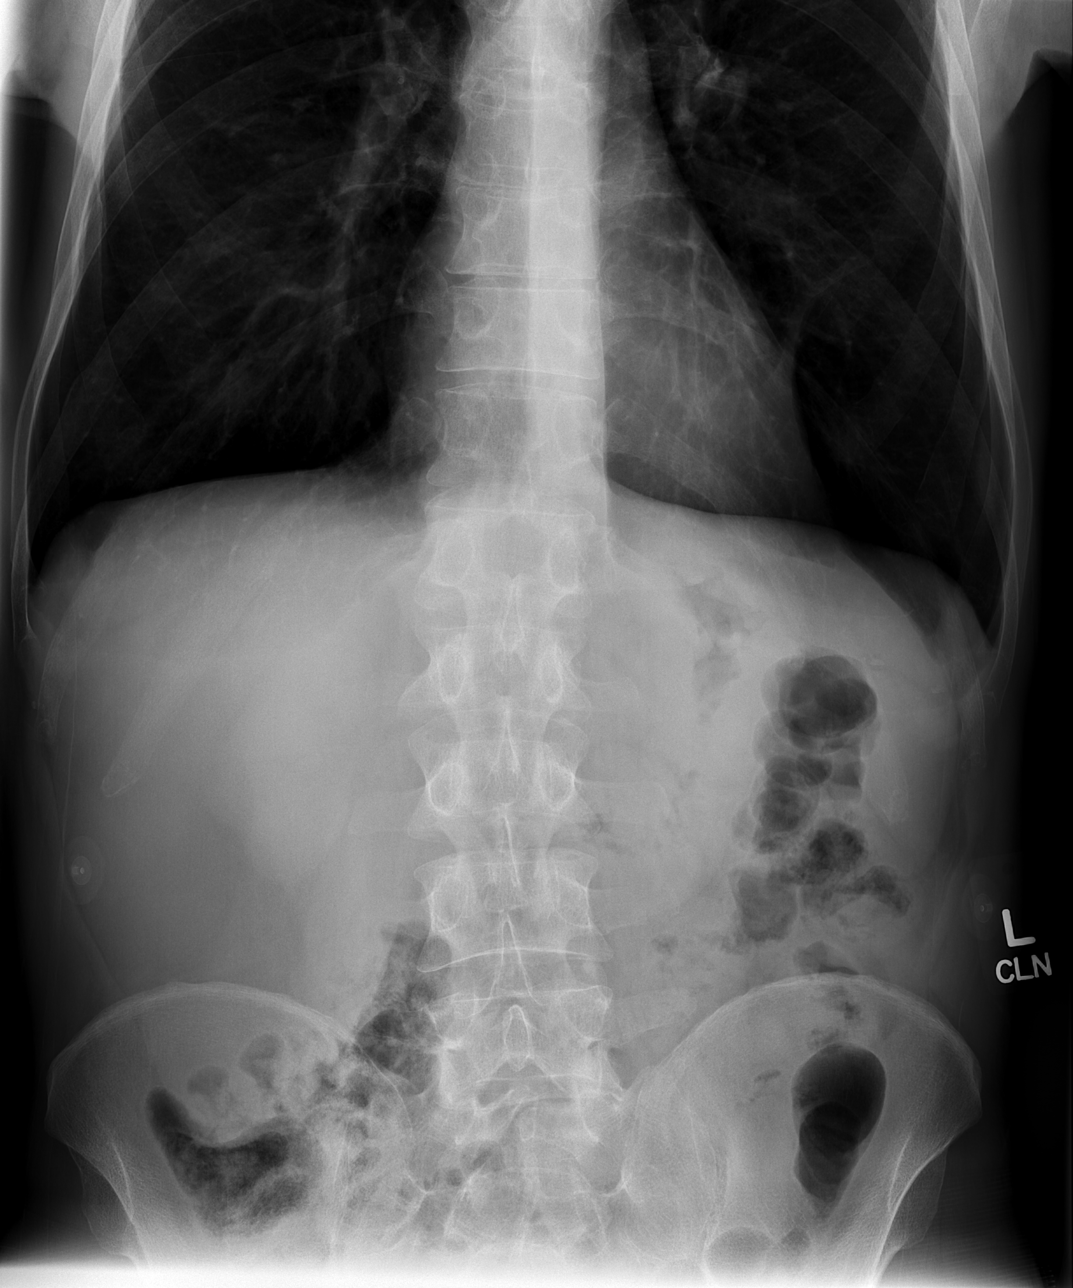

[t abdomen supine]
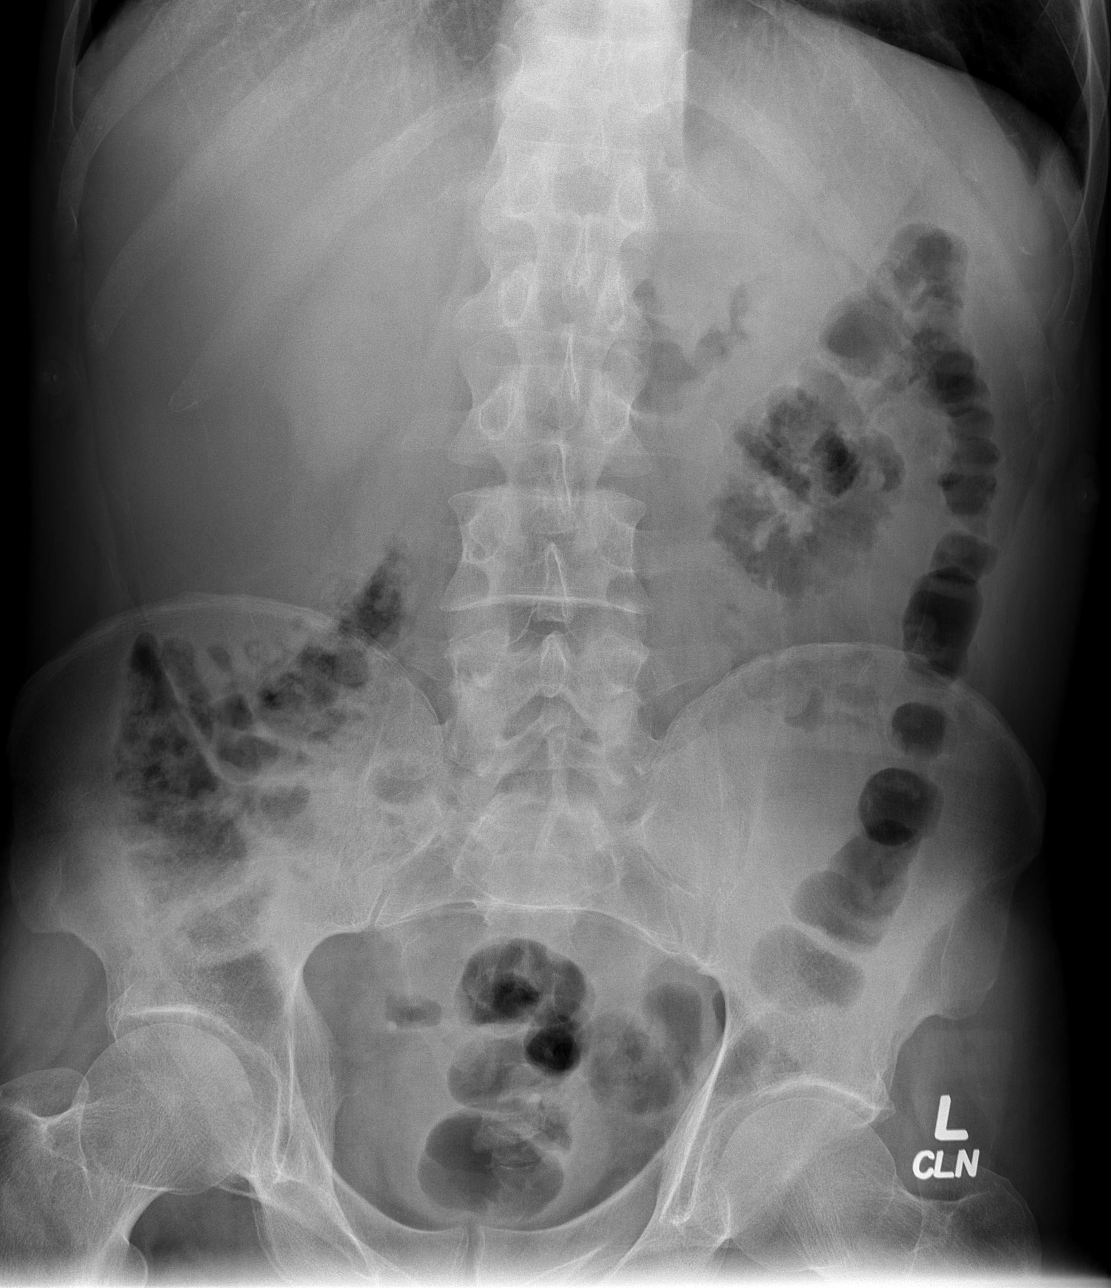

[3 of 3 positions shown; findings below may reference images not displayed]

FINDINGS: Chronically large lung volumes.  No pneumothorax,
pulmonary edema, pneumoperitoneum or pleural effusion.  No definite
acute airspace opacity. Normal cardiac size and mediastinal
contours.

Nonobstructed bowel gas pattern. The stomach appears decompressed.
Abdominal and pelvic visceral contours are within normal limits.
Suggestion of thickening of the proximal small bowel bowel delay,
and face. No acute osseous abnormality identified.
IMPRESSION: 1.  Nonobstructed bowel gas pattern and no free air.  Suggestion of
proximal thickened small bowel loops, compatible with nonspecific
inflammation.
2.  Chronic pulmonary hyperinflation.

## 2010-11-20 ENCOUNTER — Ambulatory Visit: Payer: Medicare Other | Admitting: Vascular Surgery

## 2010-11-21 IMAGING — CR DG ABDOMEN ACUTE W/ 1V CHEST
3 series · 3 of 3 positions shown · non-contrast
Comparison: 05/09/2010

CLINICAL DATA: Bleeding from nose and mild

ACUTE ABDOMEN SERIES (ABDOMEN 2 VIEW & CHEST 1 VIEW)

[w chest pa]
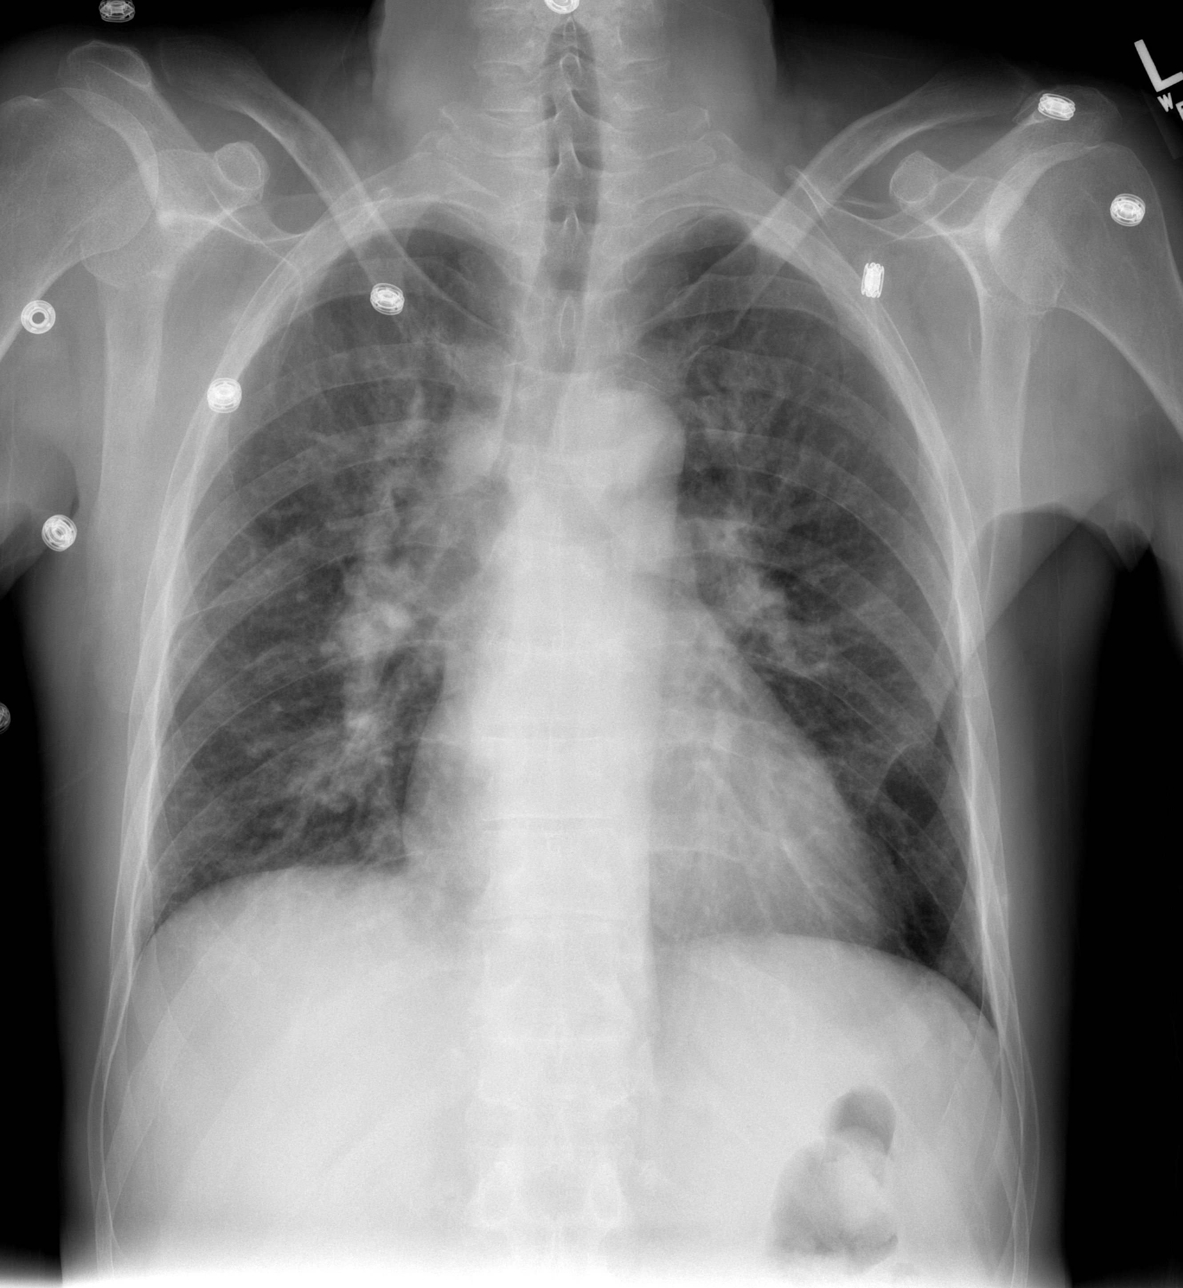

[w abdomen upright]
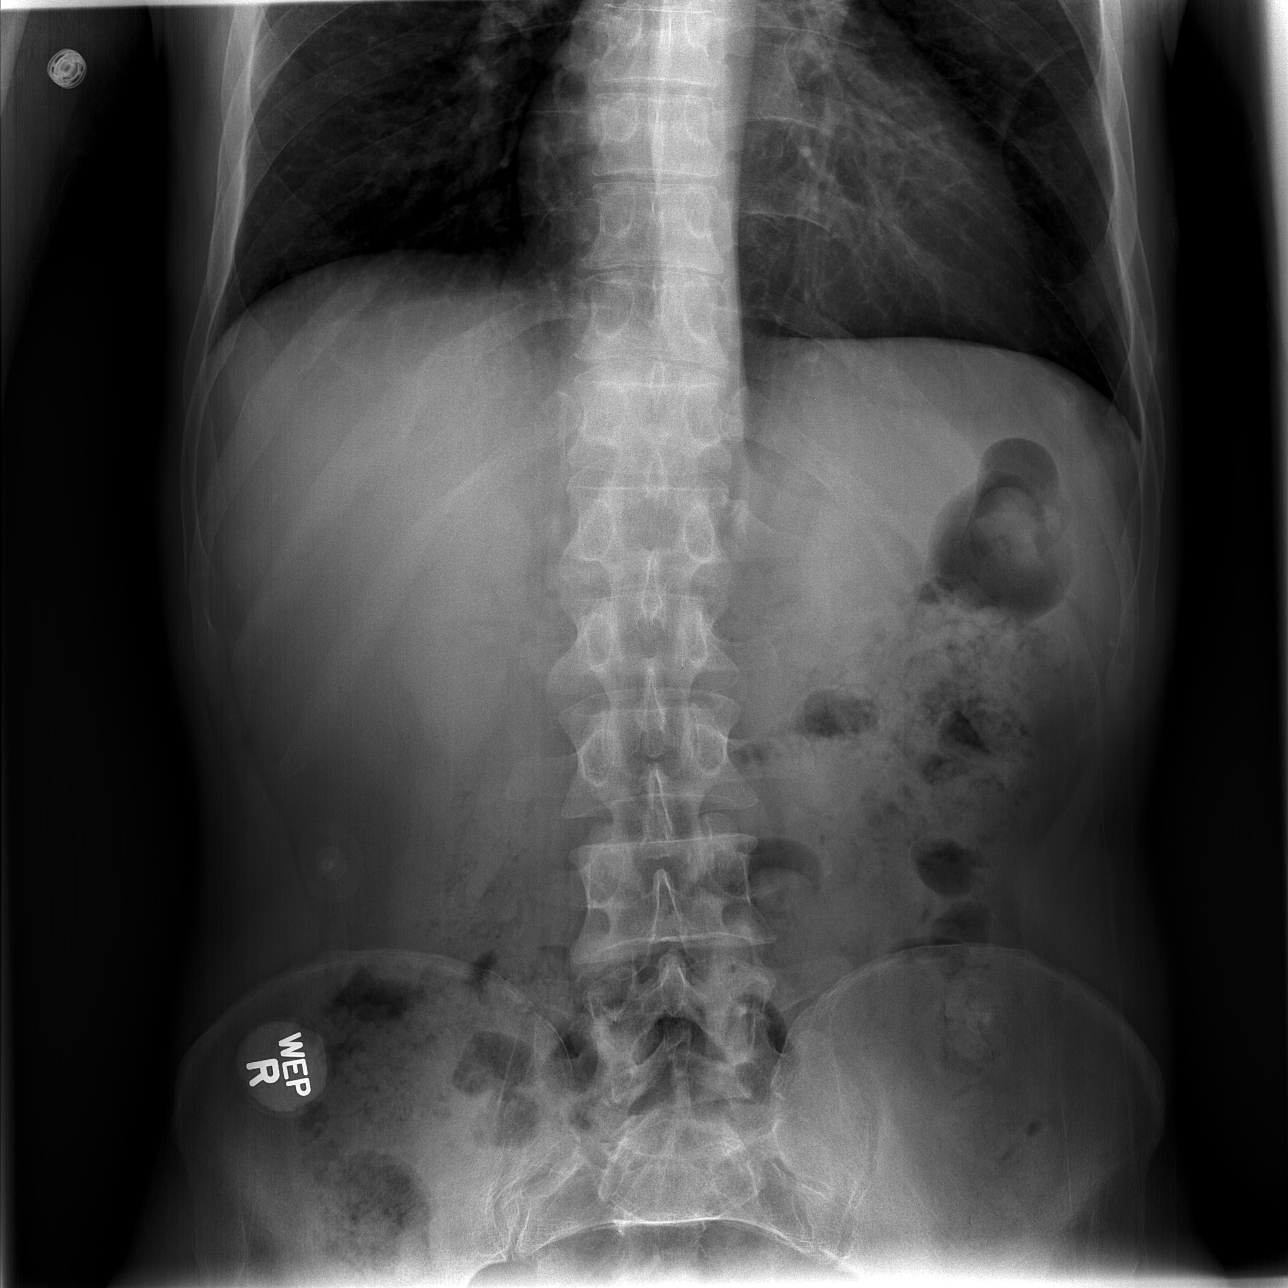

[t abdomen supine]
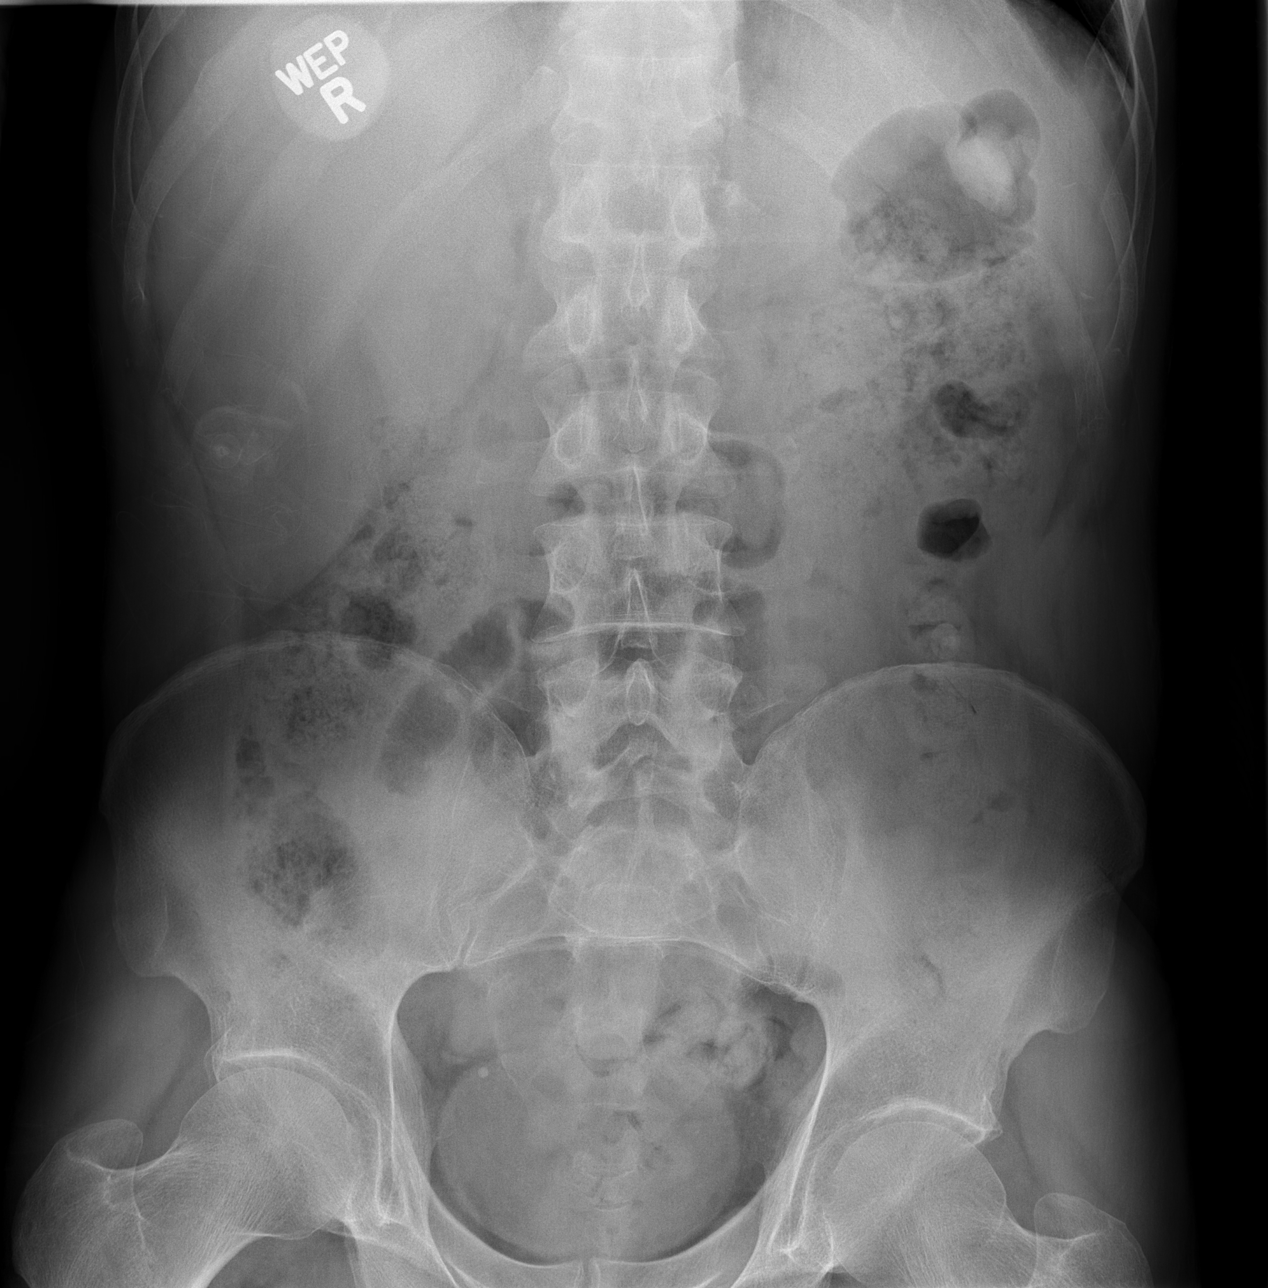

[3 of 3 positions shown; findings below may reference images not displayed]

FINDINGS: Interval increase in heart size and increase in perihilar
markings.  This suggests congestive heart failure although there is
no significant pleural fluid in one-view.  There are Kerley lines,
however.

No free air or acute/specific abnormality of the bowel gas pattern.
Psoas margins intact.  No pathological calcifications.  The patient
appears to have small kidneys.
IMPRESSION: 1.  Probable early congestive heart failure.
2.  No acute or specific abdominal findings.
3.  Probable small bilateral kidneys.

## 2010-11-21 IMAGING — CT CT HEAD W/O CM
1 of 2 series · 13 of 30 positions shown, 17 images · non-contrast
Comparison: 01/19/2009 and earlier.

CLINICAL DATA: 54-year-old male with severe headache.  Rectal,
nasal and mouth bleeding.  Pain.

CT HEAD WITHOUT CONTRAST
TECHNIQUE: Contiguous axial images were obtained from the base of
the skull through the vertex without contrast.

[Series 2: brain · axial · 0.49mm/px · z∈[+144,+291]mm · 13 of 56 slices shown, 17 images]
[im 4/56  brain]
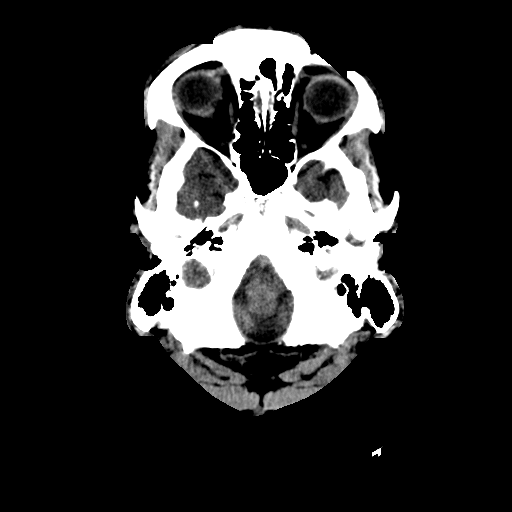
[im 4/56  bone]
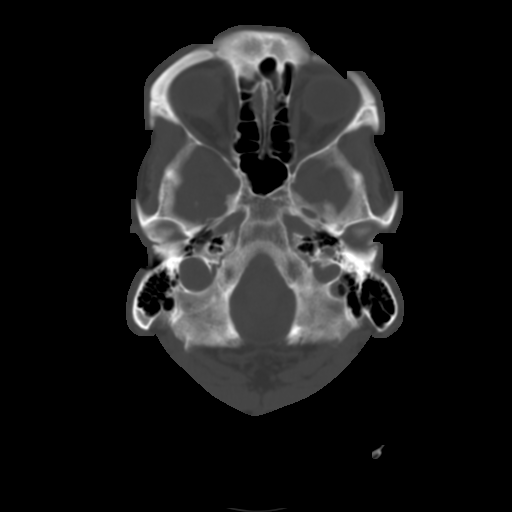
[im 8/56  brain]
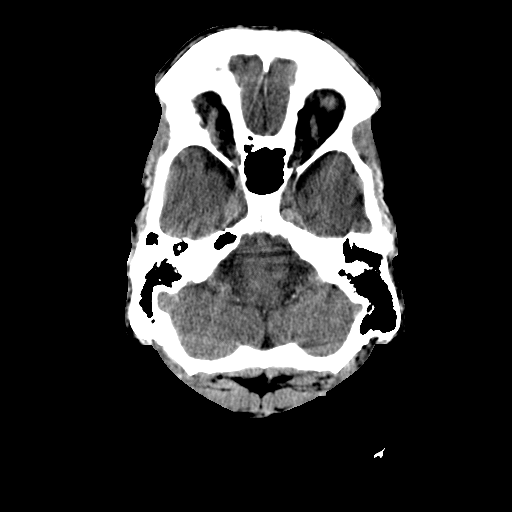
[im 12/56  brain]
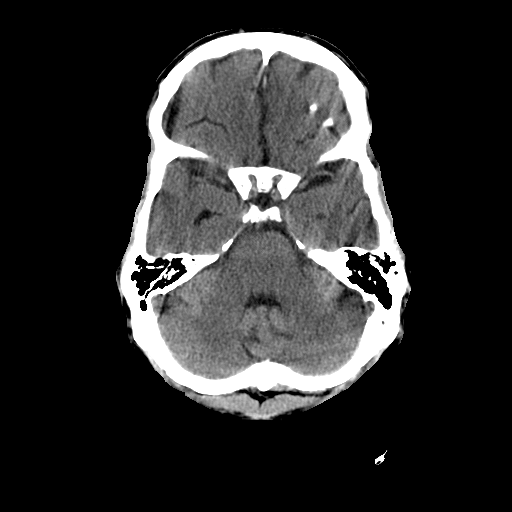
[im 16/56  brain]
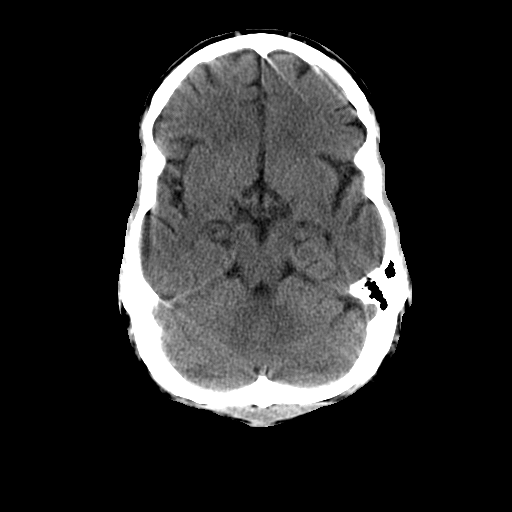
[im 20/56  brain]
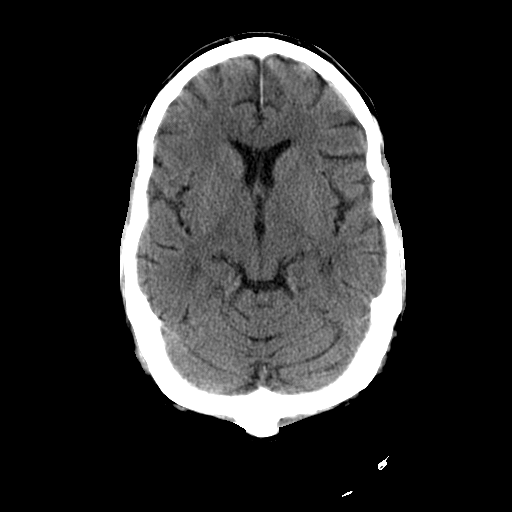
[im 20/56  bone]
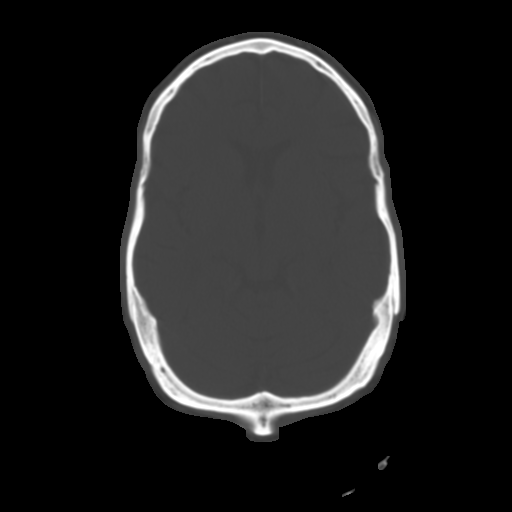
[im 24/56  brain]
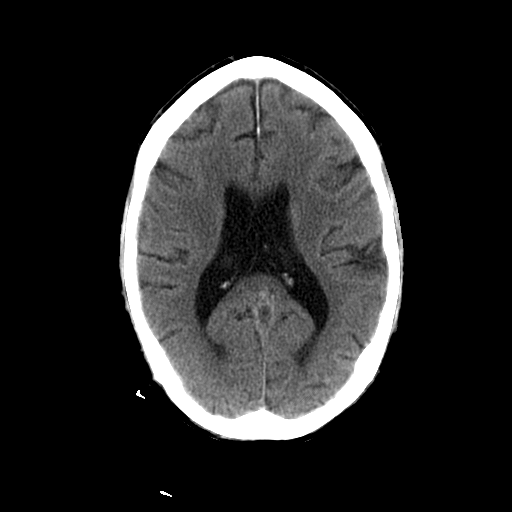
[im 28/56  brain]
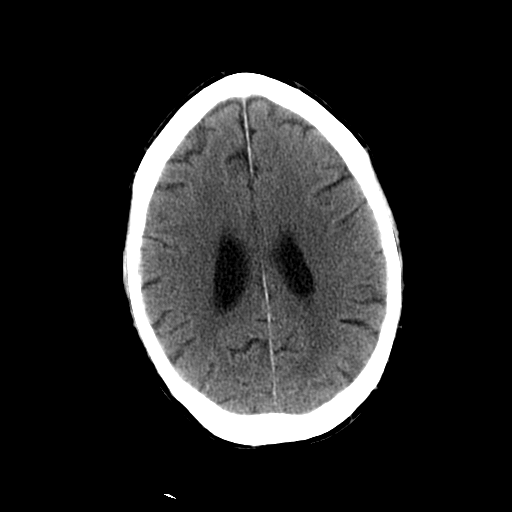
[im 32/56  brain]
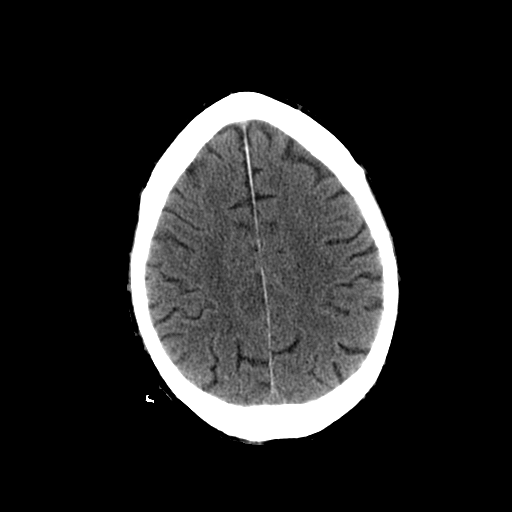
[im 36/56  brain]
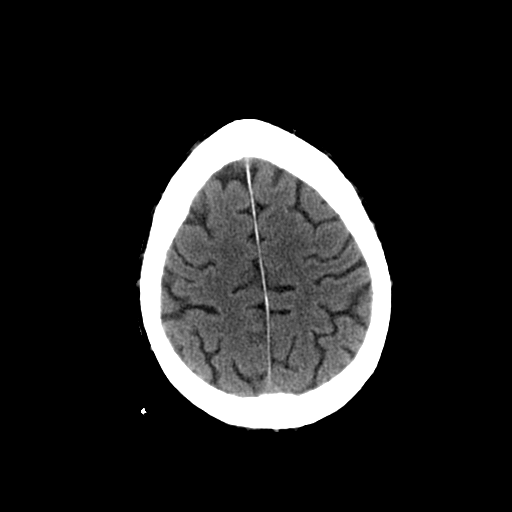
[im 36/56  bone]
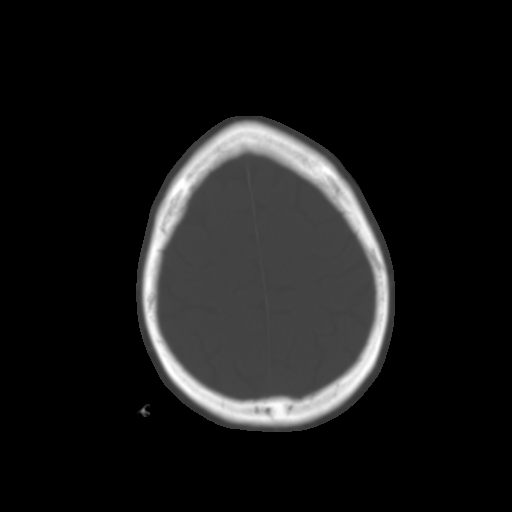
[im 40/56  brain]
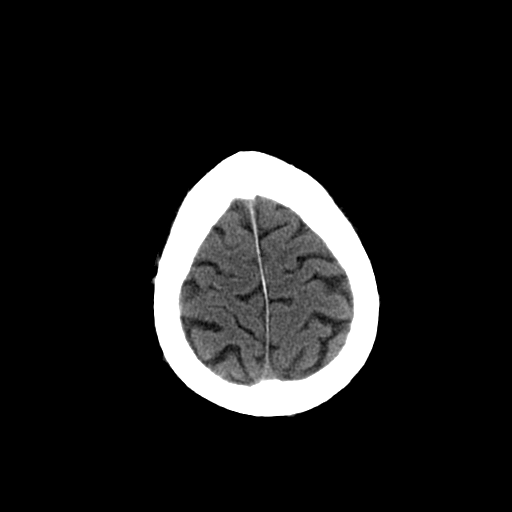
[im 44/56  brain]
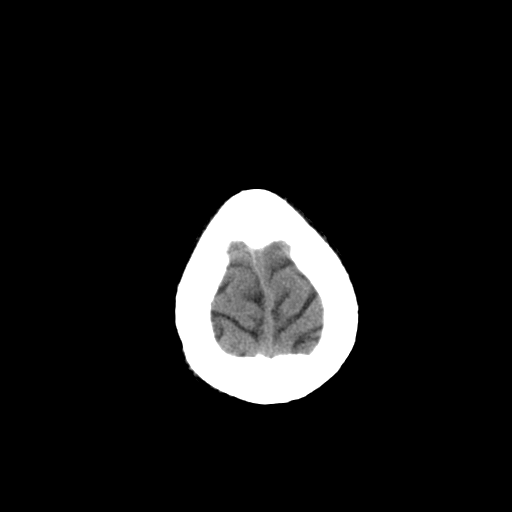
[im 48/56  brain]
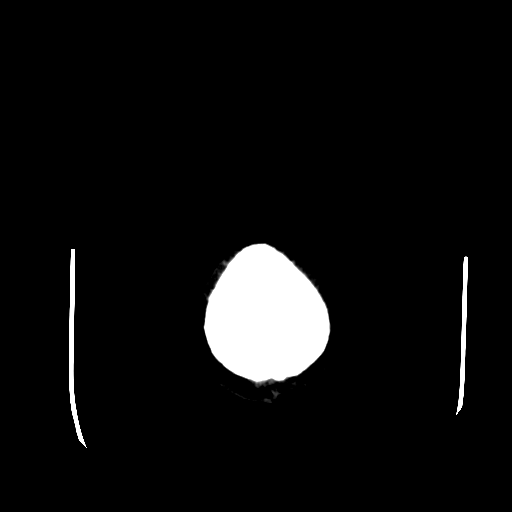
[im 52/56  brain]
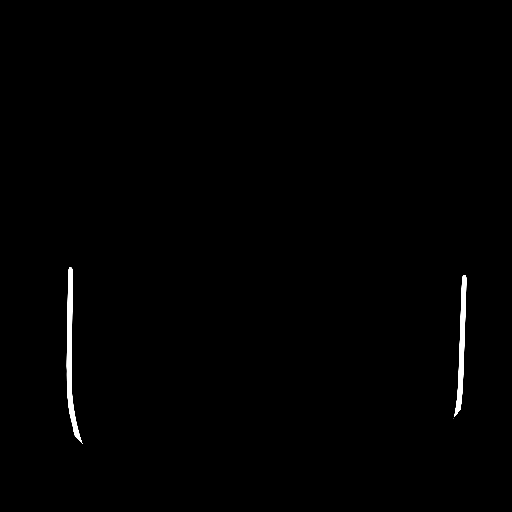
[im 52/56  bone]
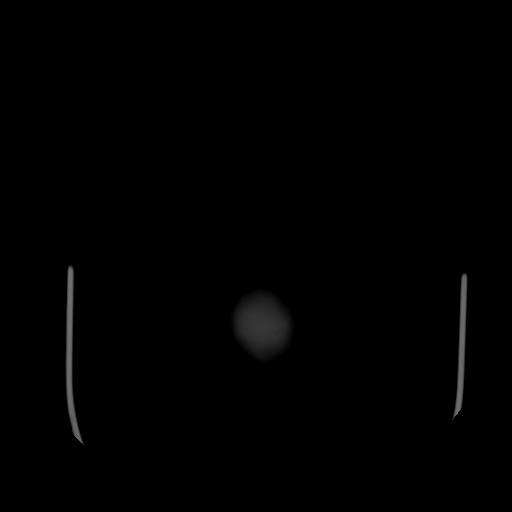

[13 of 30 positions shown; findings below may reference images not displayed]

FINDINGS: Visualized nasopharynx is within normal limits.
Visualized orbits and scalp soft tissues are within normal limits.
There is some opacification of the left maxillary sinus which is
only slightly visualized.  Other Visualized paranasal sinuses and
mastoids are clear.  No acute osseous abnormality identified.

Cerebral volume is within normal limits for age.  No midline shift,
ventriculomegaly, mass effect, evidence of mass lesion,
intracranial hemorrhage or evidence of cortically based acute
infarction.  Gray-white matter differentiation is within normal
limits throughout the brain.  Stable dural calcifications. No
suspicious intracranial vascular hyperdensity.
IMPRESSION: 1.  Stable, negative noncontrast CT appearance of the brain for
age.
2.  Aside from some maxillary opacification, visualized paranasal
sinuses and nasopharynx are within normal limits.

## 2010-11-22 ENCOUNTER — Encounter: Payer: Self-pay | Admitting: Internal Medicine

## 2010-11-22 ENCOUNTER — Ambulatory Visit (INDEPENDENT_AMBULATORY_CARE_PROVIDER_SITE_OTHER): Payer: Medicare Other | Admitting: Internal Medicine

## 2010-11-22 DIAGNOSIS — R1084 Generalized abdominal pain: Secondary | ICD-10-CM

## 2010-11-22 DIAGNOSIS — I10 Essential (primary) hypertension: Secondary | ICD-10-CM

## 2010-11-22 DIAGNOSIS — F419 Anxiety disorder, unspecified: Secondary | ICD-10-CM

## 2010-11-22 DIAGNOSIS — G8929 Other chronic pain: Secondary | ICD-10-CM

## 2010-11-22 DIAGNOSIS — F411 Generalized anxiety disorder: Secondary | ICD-10-CM

## 2010-11-22 DIAGNOSIS — F1910 Other psychoactive substance abuse, uncomplicated: Secondary | ICD-10-CM

## 2010-11-22 DIAGNOSIS — G629 Polyneuropathy, unspecified: Secondary | ICD-10-CM

## 2010-11-22 DIAGNOSIS — B1920 Unspecified viral hepatitis C without hepatic coma: Secondary | ICD-10-CM

## 2010-11-22 DIAGNOSIS — G589 Mononeuropathy, unspecified: Secondary | ICD-10-CM

## 2010-11-22 DIAGNOSIS — N186 End stage renal disease: Secondary | ICD-10-CM

## 2010-11-22 MED ORDER — GABAPENTIN 100 MG PO CAPS: 100.0000 mg | ORAL_CAPSULE | Freq: Three times a day (TID) | ORAL | Status: DC

## 2010-11-22 MED ORDER — NIFEDIPINE ER OSMOTIC RELEASE 30 MG PO TB24: 30.0000 mg | ORAL_TABLET | Freq: Every day | ORAL | Status: DC

## 2010-11-22 MED ORDER — OXYCODONE-ACETAMINOPHEN 5-325 MG PO TABS: 1.0000 | ORAL_TABLET | Freq: Four times a day (QID) | ORAL | Status: DC | PRN

## 2010-11-22 MED ORDER — CLONAZEPAM 0.5 MG PO TABS: 0.5000 mg | ORAL_TABLET | Freq: Two times a day (BID) | ORAL | Status: DC | PRN

## 2010-11-22 NOTE — Assessment & Plan Note (Signed)
Secondary to chronic pancreatitis, continue pain control with Percocet

## 2010-11-22 NOTE — Assessment & Plan Note (Signed)
Patient has been recently started on nifedipine by renal MD, we'll not make any adjustments today to this.

## 2010-11-22 NOTE — Progress Notes (Signed)
  Subjective:    Patient ID: [name], male    DOB: [birth year], 55 y.o.   MRN: [id number]  HPI  Patient is a 55 year old male with a past medical history listed below, was recently admitted to the hospital for abdominal pain secondary to chronic pancreatitis, today he is here for hospital followup, patient is still having some residual abdominal pain which is well-controlled on Percocet. The patient comes in bouts every 2 days to 3 days. Otherwise the patient is feeling well. He underwent dialysis yesterday without any complications, he is unaware his hepatitis C infection, when I told him about it, he seemed quite concerned, I discussed with him in length and decided to refer him to the hepatitis C clinic.   Review of Systems  Gastrointestinal: Positive for abdominal pain. Negative for nausea, vomiting, diarrhea and constipation.  All other systems reviewed and are negative.       Objective:   Physical Exam  Nursing note and vitals reviewed. Constitutional: He is oriented to person, place, and time. He appears well-developed and well-nourished.  HENT:  Head: Normocephalic and atraumatic.  Eyes: Pupils are equal, round, and reactive to light.  Neck: Normal range of motion. No JVD present. No thyromegaly present.  Cardiovascular: Normal rate, regular rhythm and normal heart sounds.   Pulmonary/Chest: Effort normal and breath sounds normal. He has no wheezes. He has no rales.  Abdominal: Soft. Bowel sounds are normal. He exhibits no distension. There is tenderness. There is no rebound and no guarding.  Musculoskeletal: Normal range of motion. He exhibits no edema.  Neurological: He is alert and oriented to person, place, and time.  Skin: Skin is warm and dry.          Assessment & Plan:

## 2010-11-22 NOTE — Assessment & Plan Note (Signed)
Patient denies any substance abuse, his last UDS was negative

## 2010-11-22 NOTE — Assessment & Plan Note (Signed)
We'll refer to hepatitis clinic

## 2010-11-22 NOTE — Assessment & Plan Note (Signed)
On Monday Wednesday Friday schedule with [facility]

## 2010-12-08 ENCOUNTER — Emergency Department (HOSPITAL_COMMUNITY): Payer: Medicare Other

## 2010-12-08 ENCOUNTER — Encounter: Payer: Self-pay | Admitting: Internal Medicine

## 2010-12-08 ENCOUNTER — Inpatient Hospital Stay (HOSPITAL_COMMUNITY)
Admission: EM | Admit: 2010-12-08 | Discharge: 2010-12-13 | DRG: 438 | Disposition: A | Discharge status: Home / Self Care | Payer: Medicare Other | Attending: Internal Medicine | Admitting: Internal Medicine

## 2010-12-08 DIAGNOSIS — J449 Chronic obstructive pulmonary disease, unspecified: Secondary | ICD-10-CM | POA: Diagnosis present

## 2010-12-08 DIAGNOSIS — K859 Acute pancreatitis without necrosis or infection, unspecified: Principal | ICD-10-CM | POA: Diagnosis present

## 2010-12-08 DIAGNOSIS — B1920 Unspecified viral hepatitis C without hepatic coma: Secondary | ICD-10-CM | POA: Diagnosis present

## 2010-12-08 DIAGNOSIS — K279 Peptic ulcer, site unspecified, unspecified as acute or chronic, without hemorrhage or perforation: Secondary | ICD-10-CM | POA: Diagnosis present

## 2010-12-08 DIAGNOSIS — I120 Hypertensive chronic kidney disease with stage 5 chronic kidney disease or end stage renal disease: Secondary | ICD-10-CM | POA: Diagnosis present

## 2010-12-08 DIAGNOSIS — K2920 Alcoholic gastritis without bleeding: Secondary | ICD-10-CM | POA: Diagnosis present

## 2010-12-08 DIAGNOSIS — F10939 Alcohol use, unspecified with withdrawal, unspecified: Secondary | ICD-10-CM | POA: Diagnosis present

## 2010-12-08 DIAGNOSIS — F17200 Nicotine dependence, unspecified, uncomplicated: Secondary | ICD-10-CM | POA: Diagnosis present

## 2010-12-08 DIAGNOSIS — N186 End stage renal disease: Secondary | ICD-10-CM | POA: Diagnosis present

## 2010-12-08 DIAGNOSIS — F1010 Alcohol abuse, uncomplicated: Secondary | ICD-10-CM | POA: Diagnosis present

## 2010-12-08 DIAGNOSIS — K861 Other chronic pancreatitis: Secondary | ICD-10-CM | POA: Diagnosis present

## 2010-12-08 DIAGNOSIS — D638 Anemia in other chronic diseases classified elsewhere: Secondary | ICD-10-CM | POA: Diagnosis present

## 2010-12-08 DIAGNOSIS — J4489 Other specified chronic obstructive pulmonary disease: Secondary | ICD-10-CM | POA: Diagnosis present

## 2010-12-08 DIAGNOSIS — F10239 Alcohol dependence with withdrawal, unspecified: Secondary | ICD-10-CM | POA: Diagnosis present

## 2010-12-08 DIAGNOSIS — Z992 Dependence on renal dialysis: Secondary | ICD-10-CM

## 2010-12-08 LAB — POCT I-STAT 3, ART BLOOD GAS (G3+)
Bicarbonate: 20.4 mEq/L (ref 20.0–24.0)
O2 Saturation: 70 %
TCO2: 22 mmol/L (ref 0–100)
pCO2 arterial: 44 mmHg (ref 35.0–45.0)
pH, Arterial: 7.273 — ABNORMAL LOW (ref 7.350–7.450)

## 2010-12-08 LAB — COMPREHENSIVE METABOLIC PANEL
ALT: 48 U/L (ref 0–53)
AST: 54 U/L — ABNORMAL HIGH (ref 0–37)
Albumin: 3.5 g/dL (ref 3.5–5.2)
CO2: 17 mEq/L — ABNORMAL LOW (ref 19–32)
Calcium: 7.9 mg/dL — ABNORMAL LOW (ref 8.4–10.5)
Chloride: 98 mEq/L (ref 96–112)
Creatinine, Ser: 11.44 mg/dL — ABNORMAL HIGH (ref 0.4–1.5)
GFR calc Af Amer: 6 mL/min — ABNORMAL LOW (ref >60)
GFR calc non Af Amer: 5 mL/min — ABNORMAL LOW (ref >60)
Sodium: 136 mEq/L (ref 135–145)
Total Bilirubin: 0.4 mg/dL (ref 0.3–1.2)

## 2010-12-08 LAB — ACETAMINOPHEN LEVEL: Acetaminophen (Tylenol), Serum: <15 ug/mL (ref 10–30)

## 2010-12-08 LAB — DIFFERENTIAL
Basophils Absolute: 0 10*3/uL (ref 0.0–0.1)
Basophils Relative: 0 % (ref 0–1)
Eosinophils Absolute: 0.1 10*3/uL (ref 0.0–0.7)
Neutro Abs: 1.7 10*3/uL (ref 1.7–7.7)
Neutrophils Relative %: 28 % — ABNORMAL LOW (ref 43–77)

## 2010-12-08 LAB — CBC
Hemoglobin: 12.5 g/dL — ABNORMAL LOW (ref 13.0–17.0)
Platelets: 160 10*3/uL (ref 150–400)
RBC: 4.34 MIL/uL (ref 4.22–5.81)

## 2010-12-08 LAB — LACTIC ACID, PLASMA: Lactic Acid, Venous: 1.4 mmol/L (ref 0.5–2.2)

## 2010-12-08 NOTE — H&P (Signed)
Hospital Admission Note Date: 12/08/2010  Patient name: [name] Medical record number: [id number] Date of birth: 12/22/1955 Age: 55 y.o. Gender: male PCP: Melida Quitter, MD  Medical Service:  Attending physician:  Dr. Onalee Hua   Pager: Resident (R2/R3): Dr. Arvilla Market   Pager: [phone] Resident (R1): Dr. Loistine Chance   Pager: [phone]  Chief Complaint:abdominal pain  History of Present Illness: 55 yo man with PMH of chronic alcohol intoxication, ESRD on HD, hepatitis C?, and chronic alcoholic pancreatitis with multiple admissions for acute exacerbation presents to [facility] ED with main concern of generalized abdominal pain, worst on left, radiating to his back, constant 10/10 in severity with no specific alleviating or exacerbating factors, associated with N and non-bloody vomiting, watery diarrhea.  He is unable to quantify the amount.  He reports similar episodes of pain in the past and reports it feels as his typical pancreatitis flare.  He denies drinking alcohol or using illicit drugs.  He also reports missing dialysis treatments over one month period.  Denies chestpain or SOB, fever, chills, or other systemic symptoms. Also denies SI/HI.   Current Outpatient Prescriptions  Medication Sig Dispense Refill  . albuterol (VENTOLIN HFA) 108 (90 BASE) MCG/ACT inhaler Inhale 2 puffs into the lungs every 6 (six) hours as needed.        Marland Kitchen amylase-lipase-protease (LIPRAM-CR10) 33.09-15-35.5 MU per capsule Take 3 capsules by mouth 3 (three) times daily with meals.        . B Complex-C-Folic Acid (RENA-VITE RX) 1 MG TABS Take 1 tablet by mouth daily.        . calcium acetate (PHOSLO) 667 MG capsule Take 667 mg by mouth 3 (three) times daily with meals.        . clonazePAM (KLONOPIN) 0.5 MG tablet Take 1 tablet (0.5 mg total) by mouth 2 (two) times daily as needed.  30 tablet  1  . folic acid (FOLVITE) 1 MG tablet Take 1 mg by mouth daily.        Marland Kitchen gabapentin (NEURONTIN) 100 MG capsule Take 1 capsule (100 mg  total) by mouth 3 (three) times daily.  90 capsule  2  . Iron Sucrose (VENOFER IV) Inject into the vein.        Marland Kitchen NIFEdipine (PROCARDIA XL/ADALAT-CC) 30 MG 24 hr tablet Take 1 tablet (30 mg total) by mouth daily.  30 tablet  11  . omeprazole (PRILOSEC) 40 MG capsule Take 40 mg by mouth daily.        Marland Kitchen oxyCODONE-acetaminophen (PERCOCET) 5-325 MG per tablet Take 1 tablet by mouth every 6 (six) hours as needed.  50 tablet  0  . thiamine 100 MG tablet Take 100 mg by mouth daily.        Marland Kitchen tiotropium (SPIRIVA) 18 MCG inhalation capsule Place 18 mcg into inhaler and inhale daily.        . traMADol (ULTRAM) 50 MG tablet Take 50 mg by mouth every 6 (six) hours as needed.          Allergies: Review of patient's allergies indicates no known allergies.  Past Medical History  Diagnosis Date  . Pancytopenia      chronic  . Polysubstance abuse     chronic most notable for alcohol  . End-stage renal disease on hemodialysis   . Malignant hypertension   . Hepatitis C- VL on 09/2010: 67,800, genotype 1a   . COPD (chronic obstructive pulmonary disease)   . Chronic recurrent pancreatitis     likely secondary to alcoholism  .  Smoker   . Alcohol abuse   . Respiratory failure Jan 2012    Hx of VDRF   . Alcohol withdrawal   . Pancreatitis, alcoholic     No past surgical history on file.  Family History  Problem Relation Age of Onset  . Hypertension Mother   . Alcohol abuse    . Anxiety disorder    . Hyperlipidemia    . Stroke      Social history:        -Smoke 1/2 ppd x 15 yrs,    -drink one bottle of whiskey for years. -Denies any illicit drugs    Review of Systems: As per HPI  Physical Exam: Vitals: T 98.7, BP 150/79, HR 88, RR 18, O2 sat 98% RA  GEN: Patient appears mal-nourished, NAD, sitting in bed and cooperative to examination Head: Normocephalic and atraumatic Ear: TM normal bilaterally Mouth: no erythema or exudates, very dry mucous membrane Eyes: PERRL, EOMI, conjunctivae  normal, No scleral icterus. Pupils 3mm in diameter bilaterally  Neck: Supple, Trachea midline normal ROM, No JVD, mass, thyromegaly, or carotid bruit present.  Cardiovascular: RRR, S1 normal, S2 normal, no MRG, pulses symmetric and intact bilaterally (distant heart sounds) Pulmonary/Chest: CTAB, no wheezes, rales, or rhonchi Abdominal: Soft. Diffuse tenderness, non-distended, bowel sounds are normal, mild guarding present.  GU: no CVA tenderness Musculoskeletal: No joint deformities, erythema, or stiffness, ROM full and no nontender Neurological: A&O to name and place, Strenght is normal and symmetric bilaterally, cranial nerve II-XII are grossly intact, no focal motor deficit, sensory intact to light touch bilaterally. Difficulty finger to nose and heel to shin, neg asterisix Ext: +1 pitting edema bilaterally, Warm, dry and intact. No rash, cyanosis, or clubbing.  Psychiatric: Normal mood and affect. speech and behavior is normal. Judgment and thought content normal. Cognition and memory are normal.     Lab results:  WBC                                      5.9               4.0-10.5         K/uL  RBC                                      4.34              4.22-5.81        MIL/uL  Hemoglobin (HGB)                         12.5       l      13.0-17.0        g/dL  Hematocrit (HCT)                         37.3       l      39.0-52.0        %  MCV                                      85.9  78.0-100.0       fL  MCH -                                    28.8              26.0-34.0        pg  MCHC                                     33.5              30.0-36.0        g/dL  RDW                                      16.6       h      11.5-15.5        %  Platelet Count (PLT)                     160               150-400          K/uL  Neutrophils, %                           28         l      43-77            %  Lymphocytes, %                           65         h      12-46            %   Monocytes, %                             5                 3-12             %  Eosinophils, %                           2                 0-5              %  Basophils, %                             0                 0-1              %  Neutrophils, Absolute                    1.7               1.7-7.7          K/uL  Lymphocytes, Absolute  3.9               0.7-4.0          K/uL  Monocytes, Absolute                      0.3               0.1-1.0          K/uL  Eosinophils, Absolute                    0.1               0.0-0.7          K/uL  Basophils, Absolute                      0.0               0.0-0.1          K/uL\  Sodium (NA)                              136               135-145          mEq/L  Potassium (K)                            4.2               3.5-5.1          mEq/L  Chloride                                 98                96-112           mEq/L  CO2                                      17         l      19-32            mEq/L  Glucose                                  85                70-99            mg/dL  BUN                                      86         h      6-23             mg/dL  Creatinine                               11.44      h      0.4-1.5  mg/dL  GFR, Est Non African American            5          l      >60              mL/min  GFR, Est African American                6          l      >60              mL/min    Oversized comment, see footnote  1  Bilirubin, Total                         0.4               0.3-1.2          mg/dL  Alkaline Phosphatase                     88                39-117           U/L  SGOT (AST)                               54         h      0-37             U/L  SGPT (ALT)                               48                0-53             U/L  Total  Protein                           7.9               6.0-8.3          g/dL  Albumin-Blood                            3.5               3.5-5.2          g/dL   Calcium                                  7.9        l      8.4-10.5         Mg/dL   Alcohol                                  314        h      0-10             Mg/dL   Lipase  75         h      11-59            U/L  Imaging results:  CXR:  Findings: Left pleural effusion has resolved since prior exam.   Heart size is normal.  Both lungs are clear.  No evidence of   pleural effusion.  No mass or adenopathy identified.  Right jugular   dual lumen center venous catheter remains in appropriate position.    IMPRESSION:   No active disease  Assessment & Plan by Problem:  1. Abdominal pain: likely 2/2 to acute pancreatitis in setting of alcohol intoxication which is supported by elevated lipase, physical exam findings.  Other etiologies include: cholecystitis, cholelithiasis, diverticulitis, perforation, ischemic bowel, SBO, nephroliathiasis, or cardiac etiology.   - Admit to telemetry/Renal floor - Will treat supportively with IVF 75cc/hr NS - zofran prn n/v - morphine prn for pain.   - Will keep patient NPO until clinical improvement noted.   2. Anion gap metabolic acidosis: GAP 31. Most likely multi-factorial from alcohol intoxication, starvation ketoacidosis, and uremia.  Other etiologies include ethylene glycol intoxication, lactic acidosis, tylenol/ salicylate intoxication, or renal failure.    -Will obtain stat ABG, acetoaminophen and salicylate levels, serum ketone  -Nephrology consulted: will do hemodialysis in AM.   -Will follow electrolytes and replete if needed.  3. ESRD on HD: per Renal  4. COPD: will continue Atrovent and albuterol nebs and Spiriva  5. Polysubstance abuse: Social work consult for substance cessation  VTE prophylaxis: Lovenox  R2, Travontae Freiberger Magic_______________________[phone]  R1, Michele Ho_______________________ [phone]

## 2010-12-09 DIAGNOSIS — K859 Acute pancreatitis without necrosis or infection, unspecified: Secondary | ICD-10-CM

## 2010-12-09 DIAGNOSIS — F10239 Alcohol dependence with withdrawal, unspecified: Secondary | ICD-10-CM

## 2010-12-09 LAB — BASIC METABOLIC PANEL
BUN: 91 mg/dL — ABNORMAL HIGH (ref 6–23)
Calcium: 7.5 mg/dL — ABNORMAL LOW (ref 8.4–10.5)
Chloride: 102 mEq/L (ref 96–112)
Creatinine, Ser: 11.56 mg/dL — ABNORMAL HIGH (ref 0.4–1.5)
GFR calc Af Amer: 6 mL/min — ABNORMAL LOW (ref >60)

## 2010-12-09 LAB — RENAL FUNCTION PANEL
Albumin: 3.2 g/dL — ABNORMAL LOW (ref 3.5–5.2)
Chloride: 103 mEq/L (ref 96–112)
GFR calc Af Amer: 6 mL/min — ABNORMAL LOW (ref >60)
GFR calc non Af Amer: 5 mL/min — ABNORMAL LOW (ref >60)
Phosphorus: 10.7 mg/dL — ABNORMAL HIGH (ref 2.3–4.6)
Potassium: 4.7 mEq/L (ref 3.5–5.1)
Sodium: 140 mEq/L (ref 135–145)

## 2010-12-09 LAB — CBC
Platelets: 178 10*3/uL (ref 150–400)
RBC: 4.29 MIL/uL (ref 4.22–5.81)
RDW: 17.1 % — ABNORMAL HIGH (ref 11.5–15.5)
WBC: 6.2 10*3/uL (ref 4.0–10.5)

## 2010-12-09 LAB — MRSA PCR SCREENING: MRSA by PCR: NEGATIVE

## 2010-12-09 LAB — MAGNESIUM: Magnesium: 2.3 mg/dL (ref 1.5–2.5)

## 2010-12-09 LAB — LIPASE, BLOOD: Lipase: 56 U/L (ref 11–59)

## 2010-12-10 ENCOUNTER — Inpatient Hospital Stay (HOSPITAL_COMMUNITY): Payer: Medicare Other

## 2010-12-10 LAB — RENAL FUNCTION PANEL
Albumin: 2.9 g/dL — ABNORMAL LOW (ref 3.5–5.2)
BUN: 93 mg/dL — ABNORMAL HIGH (ref 6–23)
Creatinine, Ser: 12.54 mg/dL — ABNORMAL HIGH (ref 0.4–1.5)
GFR calc Af Amer: 5 mL/min — ABNORMAL LOW (ref >60)
GFR calc non Af Amer: 4 mL/min — ABNORMAL LOW (ref >60)
Phosphorus: 12.1 mg/dL — ABNORMAL HIGH (ref 2.3–4.6)
Potassium: 5.3 mEq/L — ABNORMAL HIGH (ref 3.5–5.1)

## 2010-12-10 LAB — CBC
MCV: 90.4 fL (ref 78.0–100.0)
Platelets: 133 10*3/uL — ABNORMAL LOW (ref 150–400)
RDW: 16.5 % — ABNORMAL HIGH (ref 11.5–15.5)
WBC: 10.5 10*3/uL (ref 4.0–10.5)

## 2010-12-11 ENCOUNTER — Inpatient Hospital Stay (HOSPITAL_COMMUNITY): Payer: Medicare Other

## 2010-12-11 LAB — COMPREHENSIVE METABOLIC PANEL
Albumin: 3.2 g/dL — ABNORMAL LOW (ref 3.5–5.2)
BUN: 46 mg/dL — ABNORMAL HIGH (ref 6–23)
Creatinine, Ser: 8.15 mg/dL — ABNORMAL HIGH (ref 0.4–1.5)
GFR calc Af Amer: 8 mL/min — ABNORMAL LOW (ref >60)
Total Protein: 8.1 g/dL (ref 6.0–8.3)

## 2010-12-11 LAB — PHOSPHORUS: Phosphorus: 7.2 mg/dL — ABNORMAL HIGH (ref 2.3–4.6)

## 2010-12-11 LAB — CBC
MCH: 29.4 pg (ref 26.0–34.0)
MCV: 88.2 fL (ref 78.0–100.0)
Platelets: 109 10*3/uL — ABNORMAL LOW (ref 150–400)
RDW: 16.6 % — ABNORMAL HIGH (ref 11.5–15.5)
WBC: 17.1 10*3/uL — ABNORMAL HIGH (ref 4.0–10.5)

## 2010-12-11 LAB — LIPASE, BLOOD: Lipase: 38 U/L (ref 11–59)

## 2010-12-12 LAB — RENAL FUNCTION PANEL
CO2: 21 mEq/L (ref 19–32)
Calcium: 9.7 mg/dL (ref 8.4–10.5)
Chloride: 92 mEq/L — ABNORMAL LOW (ref 96–112)
GFR calc Af Amer: 11 mL/min — ABNORMAL LOW (ref >60)
GFR calc non Af Amer: 9 mL/min — ABNORMAL LOW (ref >60)
Potassium: 4 mEq/L (ref 3.5–5.1)
Sodium: 136 mEq/L (ref 135–145)

## 2010-12-12 LAB — CBC
Hemoglobin: 13.5 g/dL (ref 13.0–17.0)
RBC: 4.64 MIL/uL (ref 4.22–5.81)
WBC: 11.4 10*3/uL — ABNORMAL HIGH (ref 4.0–10.5)

## 2010-12-13 DIAGNOSIS — F10239 Alcohol dependence with withdrawal, unspecified: Secondary | ICD-10-CM

## 2010-12-13 DIAGNOSIS — K859 Acute pancreatitis without necrosis or infection, unspecified: Secondary | ICD-10-CM

## 2010-12-13 LAB — CBC
HCT: 37.9 % — ABNORMAL LOW (ref 39.0–52.0)
MCH: 28.9 pg (ref 26.0–34.0)
MCV: 87.5 fL (ref 78.0–100.0)
RDW: 16.5 % — ABNORMAL HIGH (ref 11.5–15.5)
WBC: 10.1 10*3/uL (ref 4.0–10.5)

## 2010-12-17 ENCOUNTER — Encounter: Payer: Self-pay | Admitting: Internal Medicine

## 2010-12-17 ENCOUNTER — Ambulatory Visit (INDEPENDENT_AMBULATORY_CARE_PROVIDER_SITE_OTHER): Payer: Medicare Other | Admitting: Internal Medicine

## 2010-12-17 DIAGNOSIS — F1010 Alcohol abuse, uncomplicated: Secondary | ICD-10-CM

## 2010-12-17 DIAGNOSIS — K219 Gastro-esophageal reflux disease without esophagitis: Secondary | ICD-10-CM

## 2010-12-17 DIAGNOSIS — G8929 Other chronic pain: Secondary | ICD-10-CM

## 2010-12-17 DIAGNOSIS — F1910 Other psychoactive substance abuse, uncomplicated: Secondary | ICD-10-CM

## 2010-12-17 DIAGNOSIS — F419 Anxiety disorder, unspecified: Secondary | ICD-10-CM

## 2010-12-17 DIAGNOSIS — R1084 Generalized abdominal pain: Secondary | ICD-10-CM

## 2010-12-17 DIAGNOSIS — I10 Essential (primary) hypertension: Secondary | ICD-10-CM

## 2010-12-17 DIAGNOSIS — F411 Generalized anxiety disorder: Secondary | ICD-10-CM

## 2010-12-17 DIAGNOSIS — N186 End stage renal disease: Secondary | ICD-10-CM

## 2010-12-17 DIAGNOSIS — B1920 Unspecified viral hepatitis C without hepatic coma: Secondary | ICD-10-CM

## 2010-12-17 LAB — CULTURE, BLOOD (ROUTINE X 2)
Culture  Setup Time: 201205081626
Culture  Setup Time: 201205081626
Culture: NO GROWTH

## 2010-12-17 MED ORDER — PANTOPRAZOLE SODIUM 40 MG PO TBEC: 40.0000 mg | DELAYED_RELEASE_TABLET | Freq: Every day | ORAL | Status: DC

## 2010-12-17 MED ORDER — CLONAZEPAM 0.5 MG PO TABS: 0.5000 mg | ORAL_TABLET | Freq: Two times a day (BID) | ORAL | Status: DC | PRN

## 2010-12-17 MED ORDER — NIFEDIPINE ER OSMOTIC RELEASE 30 MG PO TB24: 30.0000 mg | ORAL_TABLET | Freq: Every day | ORAL | Status: DC

## 2010-12-17 MED ORDER — OXYCODONE-ACETAMINOPHEN 5-325 MG PO TABS: 1.0000 | ORAL_TABLET | Freq: Four times a day (QID) | ORAL | Status: DC | PRN

## 2010-12-17 NOTE — Assessment & Plan Note (Addendum)
Still awaiting appointment at the hepatitis c clinic

## 2010-12-17 NOTE — Assessment & Plan Note (Signed)
Underwent dialysis today, is normally on Monday Wednesday Friday schedule. continue dialysis

## 2010-12-17 NOTE — Assessment & Plan Note (Signed)
Will order serum drug screen given the patient's anuric from his end-stage renal disease

## 2010-12-17 NOTE — Assessment & Plan Note (Addendum)
From recurrent pancreatitis, much improved since last admission. We'll continue to monitor

## 2010-12-17 NOTE — Progress Notes (Signed)
  Subjective:    Patient ID: [name], male    DOB: 1955/11/05, 55 y.o.   MRN: [id number]  HPI  Patient is a 55 year old male with a past medical history listed below, was recently admitted to the hospital for abdominal pain secondary to chronic pancreatitis, today he is here for hospital followup, patient is still having some residual abdominal pain which is well-controlled on Percocet. The patient comes in bouts every 2 days to 3 days. Otherwise the patient is feeling well. He underwent dialysis today without any complications, he is aware his hepatitis C infection, and is awaiting for an appointment at the hepatitis clinic. I discussed the patient's alcohol cessation progress. He now denies any alcohol abuse which seems to be the main reason for him having recurrent pancreatitis and multiple readmissions for this problem.   Review of Systems  [all other systems reviewed and are negative       Objective:   Physical Exam  Constitutional: He is oriented to person, place, and time. He appears well-developed and well-nourished.  HENT:  Head: Normocephalic and atraumatic.  Eyes: Pupils are equal, round, and reactive to light.  Neck: Normal range of motion. No JVD present. No thyromegaly present.  Cardiovascular: Normal rate, regular rhythm and normal heart sounds.   Pulmonary/Chest: Effort normal and breath sounds normal. He has no wheezes. He has no rales.  Abdominal: Soft. Bowel sounds are normal. He exhibits no distension and no mass. There is tenderness. There is no rebound and no guarding.  Musculoskeletal: Normal range of motion. He exhibits no edema.  Neurological: He is alert and oriented to person, place, and time.  Skin: Skin is warm and dry.          Assessment & Plan:

## 2010-12-17 NOTE — Assessment & Plan Note (Signed)
Patient denies alcohol abuse, and states that he has quit drinking.

## 2010-12-17 NOTE — Assessment & Plan Note (Signed)
Patient out of his medications. he requests refills.Marland Kitchen anchors to patient importance of continuing taking his medications

## 2010-12-18 LAB — COMPLETE METABOLIC PANEL WITH GFR
Albumin: 3.5 g/dL (ref 3.5–5.2)
Alkaline Phosphatase: 63 U/L (ref 39–117)
BUN: 124 mg/dL — ABNORMAL HIGH (ref 6–23)
Calcium: 8.5 mg/dL (ref 8.4–10.5)
Creat: 12.14 mg/dL — ABNORMAL HIGH (ref 0.40–1.50)
GFR, Est Non African American: 4 mL/min — ABNORMAL LOW (ref >60)
Glucose, Bld: 85 mg/dL (ref 70–99)
Potassium: 5 mEq/L (ref 3.5–5.3)

## 2010-12-18 IMAGING — CR DG CHEST 1V PORT
2 series · 2 of 2 positions shown · non-contrast
Comparison: 05/09/2010

CLINICAL DATA: Peptic ulcer disease.  Pain below diaphragm.

PORTABLE CHEST - 1 VIEW

[AP (1 of 2)]
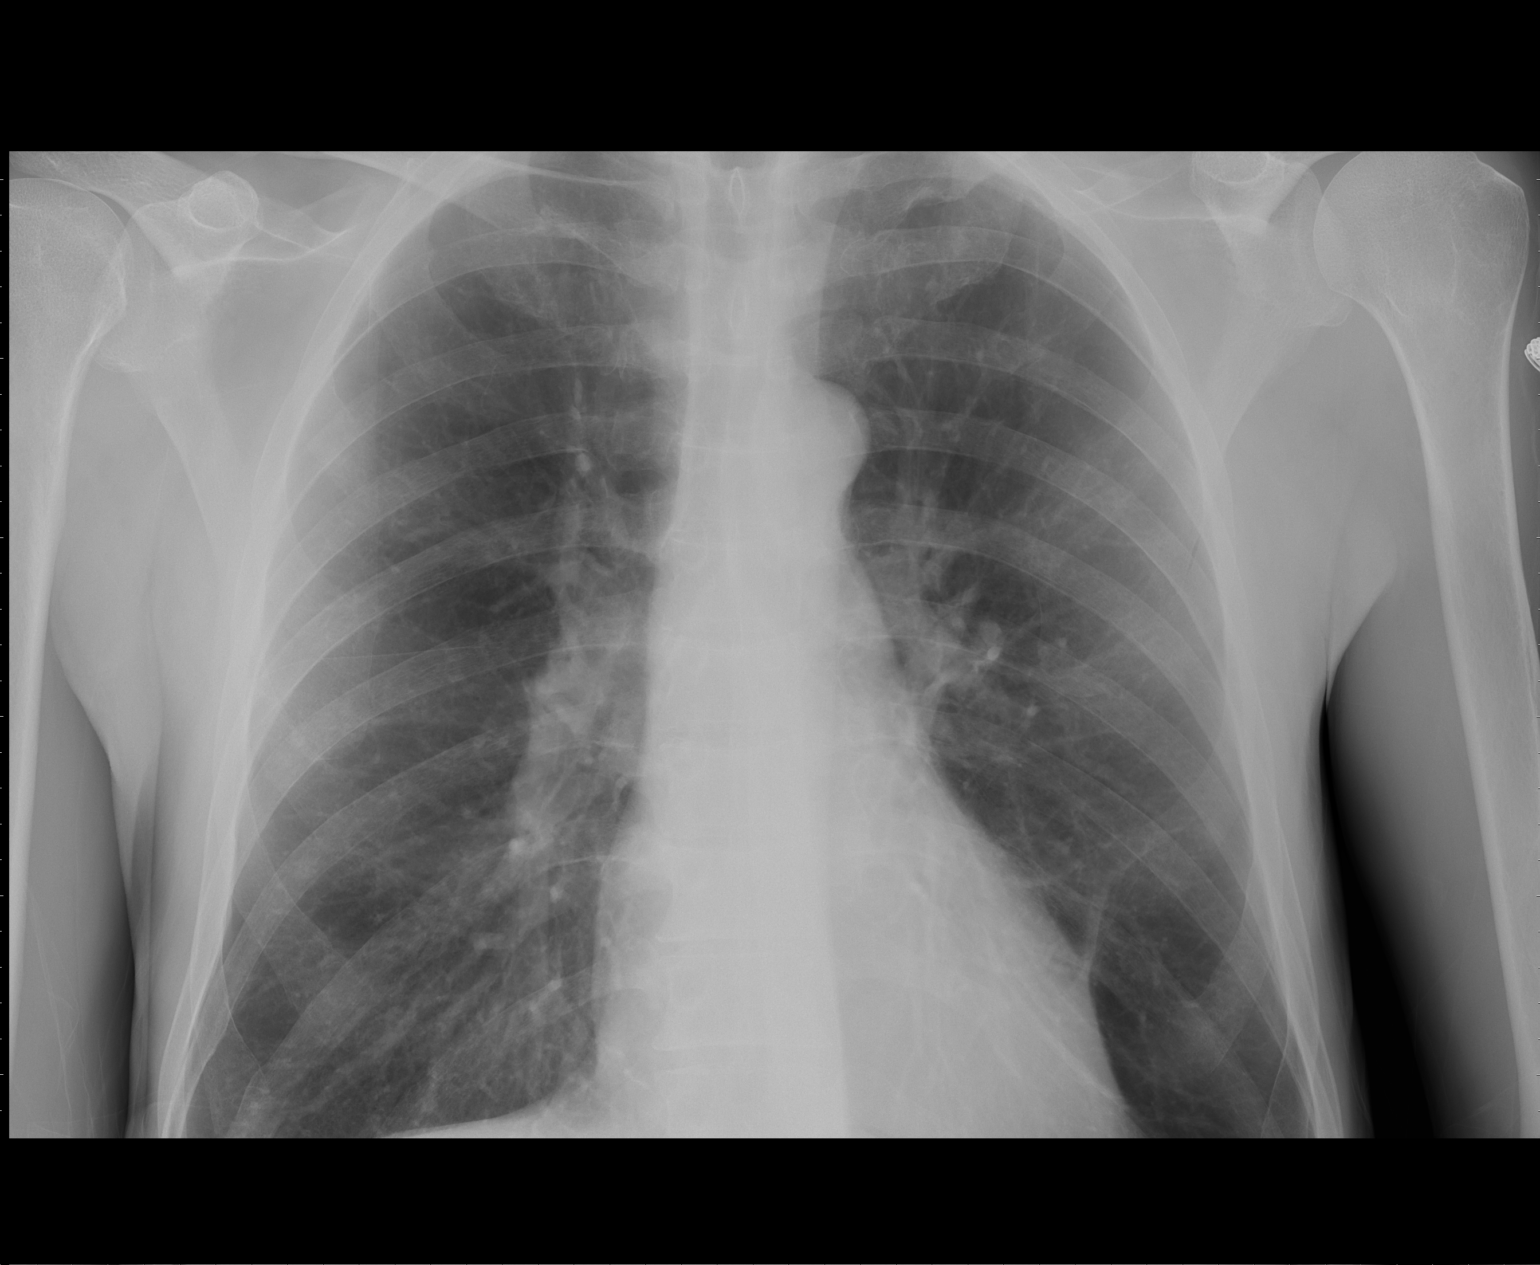

[AP (2 of 2)]
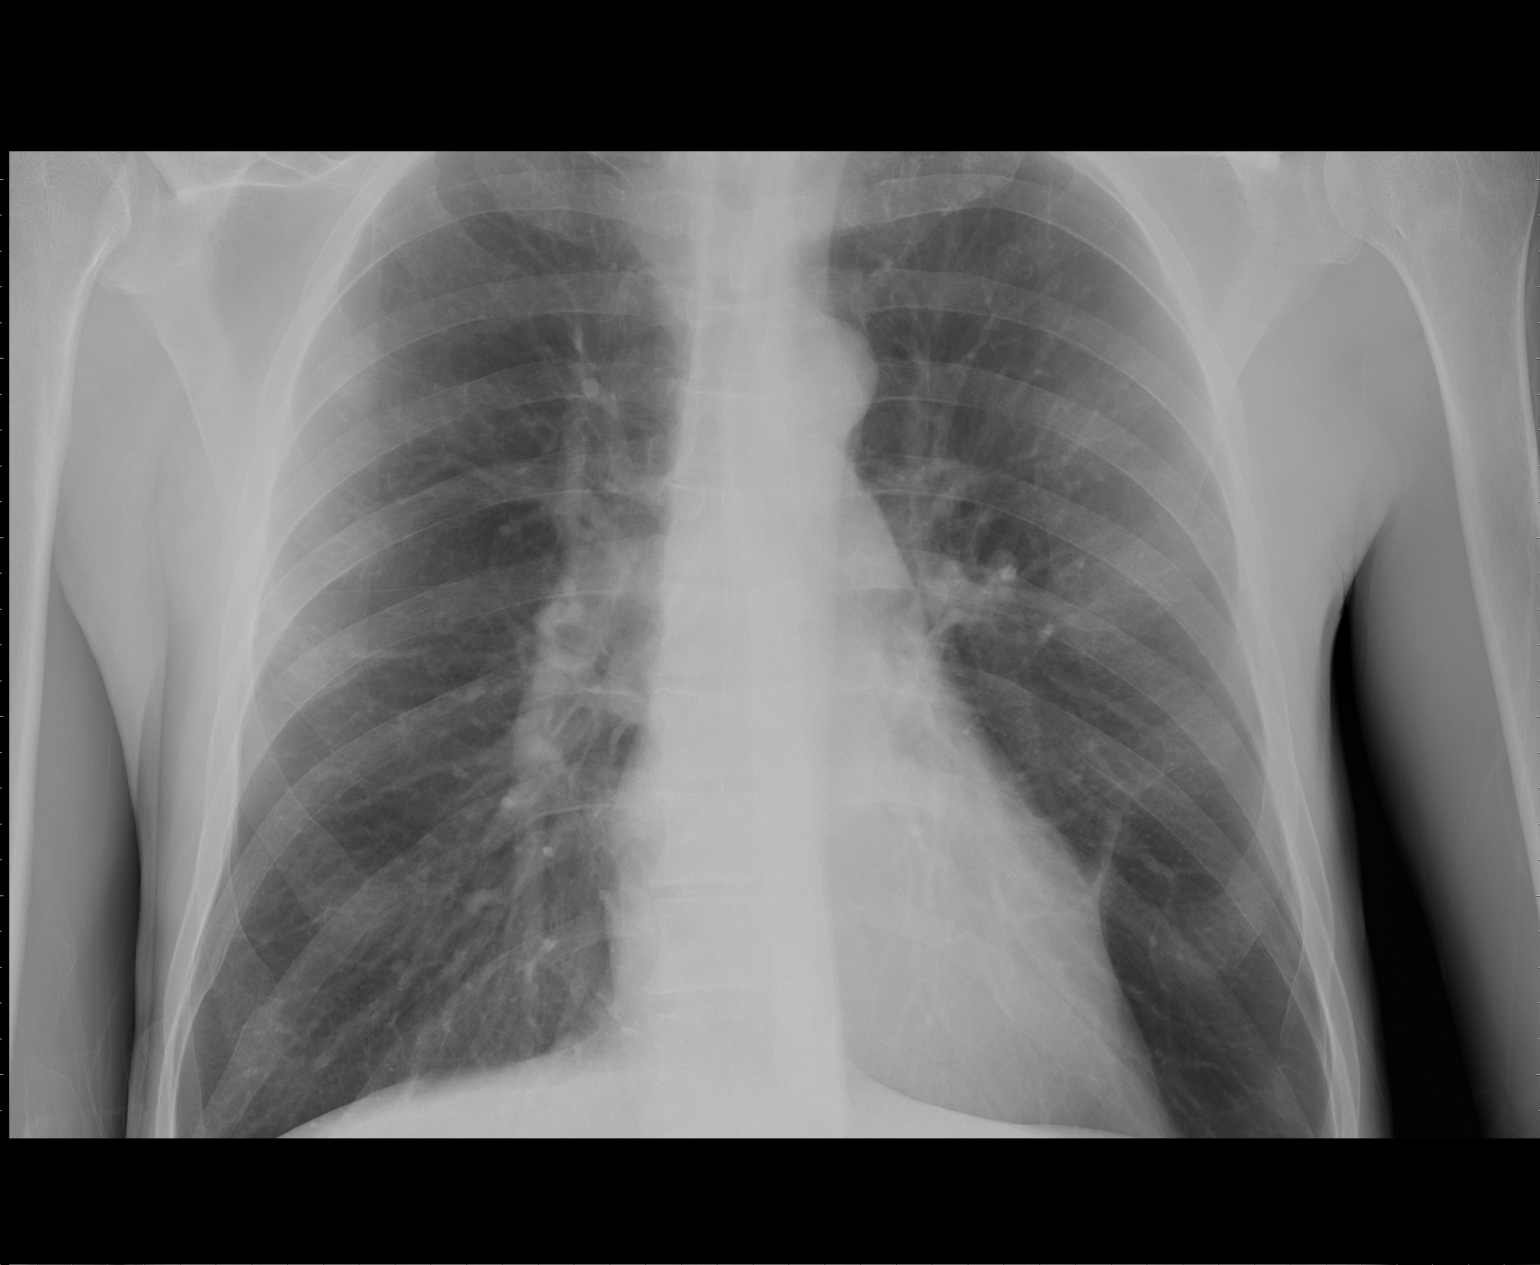

[2 of 2 positions shown; findings below may reference images not displayed]

FINDINGS: Normal heart size.  Clear lungs.  Hyperaeration.  Old rib
fractures.
IMPRESSION: No active cardiopulmonary disease.  Hyperaeration.

## 2010-12-18 NOTE — Consult Note (Signed)
[name], [name]               ACCOUNT NO.:  [id number]   MEDICAL RECORD NO.:  [id number]          PATIENT TYPE:  INP   LOCATION:  1432                         FACILITY:  [facility]   PHYSICIAN:  Antonietta Breach, M.D.  DATE OF BIRTH:  [birth year]   DATE OF CONSULTATION:  02/02/2007  DATE OF DISCHARGE:                                 CONSULTATION   CONSULTATION FOLLOW UP:  Mr. Speakman has finished the time period for his  heroin withdrawal.  He is experiencing the anniversary of his father's  death.  He has been having intermittent disorientation to place.  He has  frequent sobbing and thoughts of hopelessness and helplessness.  He is  consistently verbalizing suicidal ideation.  He has anadonia.   MENTAL STATUS EXAM:  See above.  Thought process is coherent.  He is  motivated for inpatient dual diagnosis treatment.  He does have the  insight to understand that he has severe depression and needs substance  dependence rehabilitation.  He is cooperative.  He is not combative.  His eye contact is good.  His affect is constricted.  His mood is  depressed.  He is frequently sobbing.   ASSESSMENT:  AXIS I:  Major depressive disorder, polysubstance  dependence.   RECOMMENDATIONS:  1. Concur with the sitter for suicide precautions on the medical      floor.  2. Additional psychotropics deferred.  3. Would call [phone] for a county sponsored dual diagnosis      admission.      Antonietta Breach, M.D.  Electronically Signed     JW/MEDQ  D:  02/02/2007  T:  02/02/2007  Job:  [id number]

## 2010-12-18 NOTE — Assessment & Plan Note (Signed)
OFFICE VISIT   [name], [name]  DOB:  Jan 21, 1956                                       03/22/2009  ONGEX#:[id number]   The patient is a 55 year old male who previously underwent placement of  a left brachiocephalic AV fistula on July 15.  He returns for follow-up  today.   PHYSICAL EXAMINATION:  On exam today blood pressure 153/81 right arm,  pulse 60 and regular.  The left upper extremity has a palpable thrill  over the fistula.  Incision is well-healed.  There is an audible bruit.  Fistula appears to be maturing well.  I instructed him today on  continuing to exercise the fistula.  Is currently not on dialysis.  He  will follow up on an as-needed basis.   Janetta Hora. Fields, MD  Electronically Signed   CEF/MEDQ  D:  03/22/2009  T:  03/23/2009  Job:  [id number]   cc:   Melida Quitter, MD  [facility]

## 2010-12-18 NOTE — Consult Note (Signed)
[name], [name]               ACCOUNT NO.:  [id number]   MEDICAL RECORD NO.:  [id number]          PATIENT TYPE:  INP   LOCATION:  4715                         FACILITY:  [facility]   PHYSICIAN:  Antonietta Breach, M.D.  DATE OF BIRTH:  31-Mar-1956   DATE OF CONSULTATION:  04/06/2007  DATE OF DISCHARGE:                                 CONSULTATION   HISTORY:  The patient left [facility] before he could be  admitted to his chemical dependency inpatient rehabilitation program.  He acknowledges that he left to go get a bag of heroin.   After leaving, the patient has resumed a heavy alcohol pattern.  He  stopped drinking 3 days prior to this admission.  He was admitted on  August 30th and stopped drinking on August 26th and went into alcohol  withdrawal.  He had been drinking a fifth of vodka per day.  His  withdrawal symptoms include vomiting and fever.   The patient does not have any hallucinations or delusions.  He has no  thoughts of harming himself or others.  He is cooperative with care and  socially appropriate.   [name] does have constructive future goals and interests.  He has an  intact social smile.   MENTAL STATUS EXAM:  [name] is alert.  He is oriented to all spheres.  His memory is intact to immediate, recent, and remote.  Thought process  is logical, coherent, and goal-directed.  No looseness of associations.  Thought content:  No thoughts of harming himself.  No thoughts of  harming others.  No delusions, no hallucinations.  Affect is mildly  anxious.  Mood is mildly anxious.  Judgment is within normal limits.   ASSESSMENT:  AXIS I:  Polysubstance dependence.   The patient is expressing motivation for a chemical dependency inpatient  rehabilitation program.   RECOMMENDATIONS:  Ask the social worker to arrange admission to a  chemical dependency residential rehabilitation program.      Antonietta Breach, M.D.  Electronically Signed     JW/MEDQ   D:  04/06/2007  T:  04/06/2007  Job:  [id number]

## 2010-12-18 NOTE — H&P (Signed)
NAMEETHAN, [name]               ACCOUNT NO.:  [id number]   MEDICAL RECORD NO.:  [id number]          PATIENT TYPE:  IPS   LOCATION:  0307                          [facility]  [facility]   PHYSICIAN:  Jasmine Pang, M.D. DATE OF BIRTH:  1955/12/27   DATE OF ADMISSION:  06/02/2008  DATE OF DISCHARGE:                       PSYCHIATRIC ADMISSION ASSESSMENT   IDENTIFYING INFORMATION:  A 55 year old Caucasian male.  This is an  involuntary admission.   HISTORY OF THE PRESENT ILLNESS:  One of several [facility] admissions for this  55 year old gentleman with a history of alcohol abuse who initially  presented to the emergency room with some confusion, drinking large  amounts of alcohol.  Says that he has been drinking about a quart a day.  He had been at [facility] for treatment for alcohol, then  went into acute withdrawal and was transferred to the hospital for  further evaluation.  He had some resultant confusion which was  attributed to delirium from the withdrawal which was stabilized on the  medical unit and he received an Ativan protocol.  Transferred to our  service on June 02, 2008.  Today he is fully alert and says that he  has been drinking about a quart a day recently.  Reports he was also  detoxed from opiates on an acute care unit a couple of weeks ago.  He  was involuntarily petitioned because of his delirium and concern for  possible lethal self-neglect.  He denies any suicidal thoughts or  dangerous ideas.   PAST PSYCHIATRIC HISTORY:  This is one of several [facility] admissions.  He  has a history of prior treatment at [facility] about 3 to 4 years ago.  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  He  has prior admissions at [facility] and most recent admission at  the [facility] was over a year ago.  Most recently has  been at [facility].   SOCIAL HISTORY:  Single Caucasian male, homeless, has had no  contact  with his family.  Unemployed, no income.  Has very few friends.  Endorses that his support system is very poor.  Mother died in 45.  Father died in 01/11/07.  He was previously married 22 years, separated for  the past 5 years.  Has 1 adult daughter who is 56 years old but has not  talked with her in about 1-1/2 years.   MEDICAL HISTORY:  No primary care [facility].   MEDICAL PROBLEMS:  Include:  1. Current alcohol abuse with delirium tremens.  2. 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  3. History of hepatitis C.  4. History of hyperlipidemia.  5. Chronic kidney disease.  6. History of opiate withdrawal.  7. Chronic obstructive pulmonary disease.   CURRENT MEDICATIONS:  At the time of transfer to our unit:  1. Clonidine 0.1 mg twice daily, started on the medical unit to help      him cope with withdrawal.  2. Folic acid 1 mg daily.  3. Zyprexa 10 mg p.o. or IM b.i.d. p.r.n. for  agitation.  4. Ativan 1 mg every 6 hours p.r.n. for withdrawal.  5. Thiamine 100 mg daily.  6. Protonix 40 mg daily.  7. Albuterol inhaler as needed.   Please see the full discharge summary by Dr. Eda Paschal.   MEDICATION ALLERGIES:  NONE.   PHYSICAL EXAM:  Was done on the medical unit.   DIAGNOSTIC STUDIES:  CBC, WBC 1.8, hemoglobin 12.2, platelets 103,000,  and MCH 9.6.  Her white count differential was within normal limits.  INR was 1.  Liver function tests unremarkable but albumin 2.8.  Serum  chemistry remarkable for creatinine of 2.6 and BUN of 26, otherwise  unremarkable.  Urine drug screen was positive for benzodiazepines and  tricyclics.   MENTAL STATUS EXAM:  Fully alert male, oriented x4.  Denying any  suicidal thoughts.  He admits that he is homeless and has nowhere to go.  Endorsing needing detox, has been abusing multiple substances.  No  dangerous thoughts.  Asking for help.  Cooperative.  Speech is normal.  Thinking is goal directed, linear.  No evidence of  psychosis or delirium  today.  Insight is adequate.  Impulse control and judgment within normal  limits.  Not clear on his relapse triggers or his plans to pursue  abstinence after his stay here.   DIAGNOSES:  AXIS I:  1. Delirium tremens, resolving.  2. Alcohol abuse, rule out dependence.  3. 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  4. Polysubstance abuse.  AXIS II:  Deferred.  AXIS III:  1. Hepatitis C.  2. Hyperlipidemia.  3. Hypertension.  4. Thrombocytopenia secondary to alcohol abuse.  AXIS IV:  Severe, issues with homelessness.  AXIS V:  Current 46, past year not known.   PLAN:  Is to voluntarily admit him for a safe detox.  We have admitted  him to our dual diagnosis program.  We ill recheck his liver enzymes and  CBC.  We are going to continue a Librium protocol on him until it is  finished because of his heavy alcohol use and history of delirium and  social worker will help with his housing arrangements.      Margaret A. Lorin Picket, N.P.      Jasmine Pang, M.D.  Electronically Signed    MAS/MEDQ  D:  06/03/2008  T:  06/03/2008  Job:  [id number]

## 2010-12-18 NOTE — H&P (Signed)
NAMERYE, DECOSTE NO.:  [id number]   MEDICAL RECORD NO.:  [id number]          PATIENT TYPE:  IPS   LOCATION:  0307                          [facility]ACILITY:  [facility]   PHYSICIAN:  Jasmine Pang, M.D. DATE OF BIRTH:  1956-05-27   DATE OF ADMISSION:  02/04/2007  DATE OF DISCHARGE:                       PSYCHIATRIC ADMISSION ASSESSMENT   HISTORY OF PRESENT ILLNESS:  This is a 55 year old, married white male  voluntarily February 05, 2007.  The patient presents with a history of opiate  abuse and has been using heroin and Dilaudid for the past several months  and also has been drinking up to a case a day; his last drink and drug  use was approximately a week ago.  The patient is a transfer from [facility] where he spent several days for withdrawal symptoms.  No  diarrhea and had normal lab work.  The patient also appeared to be  having some periodic episodes of delirium.  The patient denies any other  substance use such as an overdose on cocaine.  He is here to get himself  into long-term rehab program and to continue with his detox.  The  patient's intention is to go to [facility] for long-term  rehabilitation.   PAST PSYCHIATRIC HISTORY:  He was here back in 2006 for similar  symptoms.  His longest sobriety has been six to eight months.  He is  currently sponsored by [facility].   SOCIAL HISTORY:  A 55 year old, married white male who lives his wife  and patient has an adult daughter.   FAMILY HISTORY:  None.   DRUG HISTORY:  As above.  He has a history of IV drug use.   PRIMARY CARE Shewanda Sharpe:  None.   MEDICAL PROBLEMS:  Reports a history of seizure disorder that is related  to alcohol use.  His last seizure was six months ago.  History of  hypertension.   MEDICATIONS:  He was started on methadone 5 mg t.i.d. at [facility].  He was discharged on:  1. Protonix 40 mg daily.  2. Combivent MDI 2 puffs inhalation three times  daily.  3. Clonidine 0.2 mg t.i.d.  4. Thiamine 100 mg daily.   DRUG ALLERGIES:  No known allergies.   PHYSICAL EXAMINATION:  GENERAL:  The patient was fully assessed at  [facility].  He is a thin tall male.  He is in no acute  distress.  He is requesting medication for his anxiety.  VITAL SIGNS:  Temperature 98.1, heart rate 52, respirations 18, blood  pressure 159/98.   LABORATORY DATA:  Antinuclear antibody was negative.  His creatinine was  1.61.  Sickle cell was nonreactive.  Phosphorous is 4.4.  HIV was  nonreactive.  Albumin is low at 1.8.  Hepatitis C was antibody was  positive.  CBC:  Hemoglobin 11.1, hematocrit 31.6.   MENTAL STATUS EXAM:  He is fully alert, is cooperative.  He is casually  dressed.  He has good eye contact and speech is clear, normal pace and  tone.  The patient's mood is  anxious.  The patient also appears somewhat  anxious and requesting medications over and over to help him through  this period.  His thought processes are coherent.  There is no evidence  of any thought disorder.  Cognitive function intact.  Memory is fair.  Judgment and insight is fair.  Poor impulse control.  Concentration is  intact.   DIAGNOSES:  AXIS I:  Polysubstance dependence, depressive disorder, not  otherwise specified.  AXIS II:  Deferred.  AXIS III:  Hypertension, seizure disorder.  AXIS IV:  Medical problems and other psychosocial problems related to  chronic substance abuse.  AXIS V:  Current is 45-50.   PLAN:  1. Contract for safety.  2. Stabilize mood and thinking.  Will put patient on the clonidine      protocol.  3. Continue to work on relapse prevention.  4. Patient is to increased coping skills.  5. We will have to give clonidine protocol for opiate dependence.  6. Consider family session with his wife for support.  7. Case manager is to address his plan to go to a long-term rehab      program.  The length of stay is three to five days.       Landry Corporal, N.P.      Jasmine Pang, M.D.  Electronically Signed    JO/MEDQ  D:  02/06/2007  T:  02/06/2007  Job:  [id number]

## 2010-12-18 NOTE — Discharge Summary (Signed)
[name], [name]               ACCOUNT NO.:  [id number]   MEDICAL RECORD NO.:  [id number]          PATIENT TYPE:  INP   LOCATION:  5523                         FACILITY:  [facility]   PHYSICIAN:  Renee Ramus, MD       DATE OF BIRTH:  [birth year]   DATE OF ADMISSION:  01/24/2009  DATE OF DISCHARGE:  02/01/2009                               DISCHARGE SUMMARY   PRIMARY DISCHARGE DIAGNOSIS:  Polysubstance abuse with active  withdrawal.   SECONDARY DIAGNOSES:  1. Chronic pancytopenia.  2. Polysubstance abuse.  3. Chronic kidney disease, stage V.  4. Malignant hypertension.   HOSPITAL COURSE BY PROBLEM:  1. Polysubstance abuse withdrawal.  The patient is a 55 year old male      who was admitted secondary to symptoms of acute withdrawal.  The      patient has polysubstance abuse history, so was unsure which agent      was prompting this withdrawal.  The patient has had a heroin habit      as well as alcohol, and cocaine.  The patient did have extremely      hard to control blood pressure.  He was kept in-house.  He was      given a very little amounts of Ativan and opiates.  The patient is      now stable for discharge and his blood pressure is well controlled      and is requiring several medications, I believe that this is      secondary to malignant hypertension, not active process that will      change in the near future.  The patient feels stable and no longer      has craving, he is hopeful stay clean.  2. Chronic kidney disease, stage V.  The patient was seen by      Nephrology, underwent vein mapping and will likely begin      hemodialysis in the not too distant future.  His further treatment      with this is pending upon him following up with Nephrology.  3. Pancytopenia.  The patient does have a longstanding history of      pancytopenia for approximately 5-6 years.  We did do a peripheral      smear and had pathology review and found no evidence of      myelofibrosis.  The  patient does not require active treatment at      this time; however, if his pancytopenia does change, he may require      bone marrow biopsy in the future.  4. Malignant hypertension.  The patient is stable on many medications      and we are hopeful that he will continue to take these since abrupt      discontinuation of any of these medications could prompt a      hypertensive crisis.   LABS:  Of note,  1. Pancytopenia with white blood cell count of 2.8, hemoglobin of 8.9,      hematocrit of 26.5, and platelet count of 133.  This is about the  same as it has been for approximately 5-6 years prior.  2. Renal failure with BUN 45, creatinine 5.35.  This is independent of      fluid rehydration.  3. Negative CK, troponin x4.  4. Parathyroid hormone elevated at 276 with PTH of 7.2 and total      protein 5.9.  5. B12 of 312 with a folate level of 1071.  6. TSH of 5.02 with a free T4 of 0.88.  7. UA showing large blood and protein, but no evidence of infection.   STUDIES:  Ultrasound of the kidneys showing renal cortical echogenicity  suggestive of medical renal disease.   MEDICATIONS ON DISCHARGE:  1. Clonidine 0.1 mg p.o. b.i.d.  2. Minoxidil 2.5 mg p.o. b.i.d.  3. Thiamine 100 mg p.o. daily.  4. Nicotine patch continue 21 mg x2 days, 14 mg daily x7 days, and 7      mg daily x7 days.  5. Norco 5/325 one to two p.o. q.6 h. p.r.n. pain.  6. Ativan 1 mg p.o. q.6 h. p.r.n. agitation.  7. Nephro-Vite one p.o. daily.  8. Calcitriol 0.5 mcg p.o. daily.  9. PhosLo 667 mg p.o. t.i.d.  10.Lasix 80 mg p.o. b.i.d.   There are no labs or studies pending at time of discharge.  The patient  is in stable condition and anxious for discharge.   TIME SPENT:  Thirty five minutes.      Renee Ramus, MD  Electronically Signed     Renee Ramus, MD  Electronically Signed    JF/MEDQ  D:  02/01/2009  T:  02/02/2009  Job:  [id number]   cc:   Dyke Maes, M.D.

## 2010-12-18 NOTE — Consult Note (Signed)
NAMERENNIE, ROUCH               ACCOUNT NO.:  [id number]   MEDICAL RECORD NO.:  [id number]          PATIENT TYPE:  INP   LOCATION:  1403                         FACILITY:  [facility]   PHYSICIAN:  Antonietta Breach, M.D.  DATE OF BIRTH:  [birth year]   DATE OF CONSULTATION:  02/27/2007  DATE OF DISCHARGE:                                 CONSULTATION   CONSULTATION FOLLOWUP:  Mr. Pignato does not have suicidal thoughts.  He  does not have thoughts of harming others.  He has no hallucinations or  delusions; however, he has thoughts of hopelessness about his ability to  abstain from heroin.  He has a very strong sense of powerlessness.   The patient consistently points out how he has failed outpatient  programs.  He also points out how the psychiatric admission in July  helped, but it was not sustained focus on his chemical dependency.  He  is continuing to request an inpatient chemical dependency residential  treatment program.   MENTAL STATUS EXAM:  Mr. Hert is alert.  He is oriented to all spheres.  His speech is within normal limits.  Thought process logical, coherent,  goal-directed.  No looseness of associations, thought content.  No  thoughts of harming himself.  No thoughts of harming others.  No  delusions, no hallucinations.  Affect:  The patient does shed some  appropriate tears when he discusses his sense of powerlessness, and his  motivation to obtain a residential treatment program.  He also expresses  his frustration at not being accepted to the local inpatient CD program  for indigents.  His judgment is intact.  His insight is intact.  He is  socially appropriate, cooperative, and well groomed.   ASSESSMENT:  Opoid dependence.   RECOMMENDATIONS:  Would confirm that the [facility] cannot accept  the patient.  It is unclear as to what their reason for not accepting  him was.  The patient appears to meet criteria for inpatient chemical  dependence rehabilitation.  He is  indigent status.  It may be that  [facility] did not have any vacancies when contacted this week.   The social worker did report that [facility] had no inpatient  availability on July 24th.  Given the patient's repetitive failed  pattern with outpatient appointments, recontacting [facility] for  availability of their chemical dependency rehab program, inpatient, is  warranted.   Therefore, if the [facility] program will not accept this patient for  inpatient, would recontact [facility] through the social  worker and see if they still have a six week chemical dependency  inpatient program with a bed available.   DISCUSSION:  Obtaining a more intensive residential long-term chemical  dependency program for this patient will not only decrease his risk of  morbidity, but it will also help reduce the risk of ongoing recidivism  with the general medical resources.      Antonietta Breach, M.D.  Electronically Signed     JW/MEDQ  D:  02/27/2007  T:  02/27/2007  Job:  [id number]
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
## 2010-12-18 NOTE — Discharge Summary (Signed)
[name], [name]               ACCOUNT NO.:  [id number]   MEDICAL RECORD NO.:  [id number]          PATIENT TYPE:  INP   LOCATION:  1403                         FACILITY:  [facility]   PHYSICIAN:  Mobolaji B. Bakare, M.D.DATE OF BIRTH:  1955/11/05   DATE OF ADMISSION:  02/20/2007  DATE OF DISCHARGE:                               DISCHARGE SUMMARY   INTERIM DISCHARGE SUMMARY:   PRIMARY CARE PHYSICIAN:  Unassigned.   FINAL DIAGNOSES:  1. Opioid withdrawal.  2. Alcohol withdrawal.  3. Neutropenia.  4. Hepatitis C positive.  5. Elevated creatinine.  6. Tobacco abuse.   CONSULTATIONS:  Psychiatric consult provided by Anselm Jungling, MD  and Antonietta Breach, M.D.   PROCEDURES:  1. Chest x-ray done on February 20, 2007,  showed question of airspace      disease in the right lower lobe, pneumonia not excluded.  Followup      chest x-ray is pending.  2. X-ray left elbow showed no definite fracture.  There may be a very      small effusion.  No subluxation at this location noted.  3. Ultrasound of the abdomen showed bilateral echogenic kidneys of      normal size.  There is no evidence of renal obstruction, otherwise      normal study.   BRIEF HISTORY:  [name] is a 55 year old Caucasian male with history  of heroin abuse.  He uses it intravenously.  The patient was recently  admitted to [facility] and discharged on July 8.  He was  treated for heroin detoxification with clonidine protocol.  He had  depressive disorder, psychosocial problems.  The patient presented to  the emergency room 10 days later with agitation, diaphoresis, abdominal  cramps, diarrhea.  He admitted he has been using IV heroin, Xanax, and  Percocet.  He has also had alcohol binge for 3 days prior to  hospitalization.  Alcohol level at the time of evaluation was 222.  Urine drug screen was positive for benzodiazepines.  The patient was  admitted for stabilization and probable inpatient psychiatric  admission.   HOSPITAL COURSE:  #1.  OPIOID WITHDRAWAL:  He was managed symptomatically on admission  with IV fluids and Haldol p.r.n.  The patient's blood pressure was quite  elevated. He was started on clonidine.  Additionally, he was placed on  methadone on admission.  Methadone has been discontinued.  The patient  is continued on clonidine 0.1 mg 3 times a day.  Blood pressure is now  improved.  Symptoms are better.  No more nausea, vomiting, and diarrhea.  He still has some abdominal cramps which he has noted is improving.   #2.  ALCOHOL WITHDRAWAL:  On the third day of admission, the patient  developed alcohol withdrawal.  He was started on alcohol withdrawal  protocol with Ativan.  He was also placed on thiamine and folic acid.   #3.  NEUTROPENIA:  The patient was noted on admission to have a white  cell count of 2.3 (normal 4.0 to 10.5) with absolute neutrophil count of  600.  There were atypical  lymphocytes on morphology with absolute  lymphocyte count of 1.5 (normal).  It was also noted that patient had a  normal CBC and differential on July 1 with total white cell count of  6.2, absolute neutrophil count of 4.3.  Upon discharge from [facility] on July 8, the patient was discharged home on Ambien.  Prior to  the Behavioral Health hospitalization, he was hospitalized in [place]  on January 28, 2007, and discharged on February 03, 2007.  He was treated at  that time for heroin withdrawal, alcohol abuse, and hypertension.  He  was discharged home on methadone, Combivent, clonidine, Protonix, and  thiamine.  This neutropenia is acute, and it could be related to alcohol  abuse.  The patient has no fever.  Will continue to monitor CBC during  the course of hospitalization.   #4.  HISTORY OF CHRONIC KIDNEY DISEASE:  The patient does have a history  of chronic kidney disease, and creatinine is still going down because of  hospitalization. He was evaluated previously earlier this  month by Dr.  Caryn Section on February 02, 2007, and recommendation is to avoid nephrotoxic  medications.  He had HIV negative.  ANA was negative.  Hepatitis profile  was positive for hepatitis C.  There was no plan for hemodialysis or  renal biopsy.  Chronic kidney disease is thought secondary to  nephrosclerosis.  The patient is not reliable based on his underlying  behavior.   #5.  HEPATITIS C POSITIVE:  The patient will need followup with GI at  some point when stable from polysubstance abuse.   Addendum will be added to this dictation at the time of discharge.      Mobolaji B. Corky Downs, M.D.  Electronically Signed     MBB/MEDQ  D:  02/24/2007  T:  02/24/2007  Job:  [id number]

## 2010-12-18 NOTE — Discharge Summary (Signed)
NAMEVIRGILIO, BROADHEAD               ACCOUNT NO.:  [id number]   MEDICAL RECORD NO.:  [id number]          PATIENT TYPE:  INP   LOCATION:  1511                         FACILITY:  [facility]   PHYSICIAN:  Peggye Pitt, M.D. DATE OF BIRTH:  October 27, 1955   DATE OF ADMISSION:  04/25/2008  DATE OF DISCHARGE:  05/05/2008                               DISCHARGE SUMMARY   DISCHARGE DIAGNOSES:  1. Opiate withdrawal.  2. Chronic obstructive pulmonary disease.  3. Hypertension.  4. Alcohol abuse.  5. Hyperlipidemia.  6. Hepatitis C.  7. Chronic kidney disease.  8. Hemoptysis.   DISCHARGE MEDICATIONS:  1. Azithromycin 500 mg p.o. daily for 5 more days.  2. Thiamine 100 mg p.o. daily.  3. Folate 1 mg p.o. daily.   DISPOSITION AND FOLLOWUP:  The patient has been scheduled to follow up  with Dr. Camille Bal to follow up with his renal function.  This  appointment has been scheduled for June 03, 2008, at 10:30 in the  morning.  He has also been giving a number for [facility] to establish  them as his primary care physician given he is uninsured.   CONSULTATIONS DURING THIS HOSPITALIZATION:  1. Dr. Camille Bal with [facility].  2. Dr. Coralyn Helling with [facility].   IMAGING PERFORMED DURING THIS HOSPITALIZATION:  1. A CT pelvis and abdomen without contrast consistent with small      bilateral pleural effusions and atelectasis, but no abnormalities.      2.  He also had a renal ultrasound on April 25, 2008,      consistent with increased renal echogenicity bilaterally, but no      evidence of hydronephrosis or mass.  2. Chest x-ray on May 02, 2008, consistent with new right      pleural effusion and overlying atelectasis.  Repeat chest x-ray on      May 04, 2008, that showed improved right effusion and      overlying atelectasis.   HISTORY OF PRESENT ILLNESS:  For full details, please refer to dictation  done by Dr. Della Goo on  April 25, 2008, but in brief, Mr.  Ridling is a 55 year old man who had been admitted to the [facility] 1 day prior to admission when he was transferred to the  [facility] Emergency Department secondary to nausea,  vomiting, shortness of breath, and coughing up green blood-tinged mucus.  He also reported having fever and chills.  He has a history of  polysubstance abuse including heroin, cocaine, alcohol, and narcotic  pain medication, and we are called to admit him.   HOSPITAL COURSE:  1. Chronic renal failure.  He had been seen by Dr. 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      She has a followup scheduled with her for June 03, 2008.  His      creatinine has remained stable in the 2.7-2.8 range.  2. For  his substance withdrawal.  He is stable.  Currently complaining      of abdominal pain and pain all over, but I have told him that we      will not give him any narcotic pain medication, and I have offered      Toradol.  3. For his hemoptysis.  I have requested consultation of Dr. Craige Cotta with      Pulmonary Critical Care.  He believes that he might have some      airway disease/bronchitis, though he has started him on      azithromycin 500 mg daily for 5 days.  4. All other chronic medical issues were stable throughout      hospitalization.  5. Vital signs upon discharge; blood pressure 120/69, heart rate 68,      respirations 18, and O2 sats 97% on room air with a temperature of      97.5.  6. Labs on the day of discharge; sodium 139, potassium 5.3, chloride      106, bicarb 28, BUN 33, and creatinine 2.77 with a glucose of 93.      WBCs 3.9 and platelets of 222.  His hemoglobin has remained stable      on April 30, 2008.      Peggye Pitt, M.D.  Electronically Signed     EH/MEDQ  D:  05/05/2008  T:   05/06/2008  Job:  [id number]   cc:   Duke Salvia. Eliott Nine, M.D.  Fax: [phone]   Coralyn Helling, MD  [address]  [place], [place] [address]

## 2010-12-18 NOTE — Discharge Summary (Signed)
[name], [name]               ACCOUNT NO.:  [id number]   MEDICAL RECORD NO.:  [id number]          PATIENT TYPE:  INP   LOCATION:  1419                         FACILITY:  [facility]   PHYSICIAN:  Theodosia Paling, MD    DATE OF BIRTH:  [birth year]   DATE OF ADMISSION:  06/07/2008  DATE OF DISCHARGE:  06/09/2008                               DISCHARGE SUMMARY   PRIMARY CARE PHYSICIAN:  None   DISCHARGE DIAGNOSES:  1. Viral gastroenteritis.  2. Back pain.  3. Polysubstance abuse.  4. Hypertension.  5. Chronic alcoholism.   DISCHARGE MEDICATIONS:  1. Clonidine 0.5 mg p.o. q.12 hours.  2. Folic acid 1 mg PO Q daily.  3. Zyprexa 10 mg p.o. t.i.d. p.r.n. agitation.  4. Ativan 1 mg PO q.6 hours.  5. Thiamine 100 mg p.o. daily.  6. Protonix 40 mg p.o. daily.  7. Albuterol oral inhaler every 6 hours p.r.n.  8. Percocet 5/325 mg p.o. q.6 hours for back pain.   HOSPITAL COURSE:  1. Viral gastroenteritis.  Patient's symptoms resolved in 48 hours and the patient was able to keep  down p.o. diet very well right now.  Protonix and a good bowel movement.  Abdominal examination stayed benign.  1. Back pain.  Patient received percocet which adequately controlled his back pain.  1. Chronic kidney disease.  The patient's creatinine came back to      baseline and is now 2.4 to 2.6.  No electrolyte or volume      disturbances were present.  2. Polysubstance abuse.  The patient is getting care at [facility].   DISPOSITION:  The patient is getting transferred back to [facility] for further assessment and evaluation of polysubstance abuse.  Discharge took around 45 minutes.   PROCEDURES PERFORMED:  None.   CONSULTATIONS PERFORMED:  None.   IMAGING PERFORMED:  Ultrasound of the abdomen done on November 4th, was  negative for any acute pathology.  X-ray on November 3rd showed negative rib fracture.  COPD without any  active lesions.  CT scan of the abdomen and pelvis showed  no acute  abdominal pathology.  Abdominal x-ray done on November 3rd showed  diffuse colonic stool  suggestive of constipation.      Theodosia Paling, MD  Electronically Signed     NP/MEDQ  D:  06/09/2008  T:  06/09/2008  Job:  [id number]
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
## 2010-12-18 NOTE — Consult Note (Signed)
[name], [name]               ACCOUNT NO.:  [id number]   MEDICAL RECORD NO.:  [id number]          PATIENT TYPE:  INP   LOCATION:  5523                         FACILITY:  [facility]   PHYSICIAN:  Dyke Maes, M.D.DATE OF BIRTH:  04/15/1956   DATE OF CONSULTATION:  01/30/2009  DATE OF DISCHARGE:                                 CONSULTATION   REFERRING PHYSICIAN:  Renee Ramus, MD   REASON FOR CONSULTATION:  Chronic kidney disease.   HISTORY OF PRESENT ILLNESS:  This is a 55 year old white male admitted  on January 24, 2009 to undergo polysubstance abuse detoxification.  He has  had known chronic kidney disease per his account for at least the past  year but has never seen a nephrologist.  As a matter of fact, he has  never seen a doctor outside the hospital as he has been extremely  noncompliant with any followup.  He says he has had a few episodes of  gross hematuria, further details of that are unknown.  He has had high  blood pressure for at least 3 years, although probably longer.  He does  have some decreased flow of stream and does not feel like he completely  empties his bladder for the past year.  In addition, he takes 4-6  aspirins or ibuprofens on a daily basis and he has done this for years  because of chronic headaches related to his substance abuse.  His  electrolytes that were sent out to me are serum creatinine in October  2006 was 1.2, July 2008 was 1.7, August 2008 was 1.8, September 2009 was  3.14, November 2009 was 3.0, January 13, 2009 was 4.6, June 18 was 3.9,  June 24 was 4, June 26 was 4.4, June 27 was 5.2, and June 28 was 5.2.  He had an ultrasound done in 2009 which showed increased echogenicity  with a right kidney of 10.7 cm and left kidney of 10.6 cm.  Recent CT of  the abdomen was essentially unremarkable except for showing a chronic  contour deformity of the left kidney.  He has had a negative ANA and HIV  in the past and recently negative HIV.  He has  had previous urinalysis  that predominantly showed protein.  He does have a history of hepatitis  C which has never required treatment.   PAST MEDICAL HISTORY:  1. Hypertension.  2. Hepatitis C.  3. Polysubstance abuse.  4. Chronic kidney disease.  5. Thrombocytopenia.  6. COPD.   ALLERGIES:  None.   MEDICATIONS:  1. Catapres 0.3 mg t.i.d.  2. Lovenox 30 mg a day.  3. Lasix 20 mg a day.  4. Ativan 1 mg a day.  5. Lanoxin 0.5 mg b.i.d.  6. Protonix 40 mg a day.  7. Thiamine 100 mg daily.   SOCIAL HISTORY:  He is a one-pack per day smoker for 34 years and he  continues to smoke.  He was drinking alcohol predominantly beer up until  admission.  He is divorced and has one daughter.  He was living at the  [facility].  His cell phone number is [phone].  His ex-  wife cell phone number whom he still has cordial relationship with is  [phone].   FAMILY HISTORY:  Mother died of complications from a stroke and  hypertension.  Father died at age 64 after a fall and also had  hypertension.  He has two brothers with hypertension.  He denies any  family history of renal disease.   REVIEW OF SYSTEMS:  Appetite is fair.  No shortness of breath, PND, or  orthopnea.  No chest pains or chest pressures.  No change in bowel  habits, specifically no melena or hematochezia.  He has chronic numbness  and tingling in both hands and feet and frequent problems with cramping  of his calves.  He has chronic pain in his elbow and wrist.  No new skin  rashes.  Rest of the review of systems is unremarkable.   PHYSICAL EXAMINATION:  VITAL SIGNS:  Blood pressure 145/73, pulse 73,  and temperature 98.4.  GENERAL:  A 55 year old white male in no acute stress.  HEENT:  Sclerae nonicteric.  Extraocular muscles are intact.  NECK:  No JVD.  LUNGS:  Decreased breath sounds throughout.  HEART:  Regular rate and rhythm without murmur, rub, or gallop.  ABDOMEN:  Positive bowel sounds, nontender,  and nondistended.  No  splenomegaly.  EXTREMITIES:  1+ edema.  Pulses 2/4 in the carotids, radials, femorals,  dorsalis pedis, and posterior tibial.  NEUROLOGIC:  Cranial nerves intact.  Motor and sensory intact.  Oriented  x3.  No asterixis.   LABORATORY DATA:  Sodium 137, potassium 5.3, bicarb 23, BUN 47, and  creatinine 5.22.  Hemoglobin 8.9, white count 2.5, and platelet count  128,000.   IMPRESSION:  1. Chronic kidney disease, stage IV.  Mostly secondary to multiple      factors including hypertension, nonsteroidal, plus or minus heroin      use, plus or minus hepatitis C.  2. Hypertension.  3. Anemia.  4. Hepatitis C.  5. Substance abuse.   PLAN:  1. We will check a serum protein electrophoresis, check intact PTH,      and check renal ultrasound.  We will show him the dialysis video      tapes.  We will start Aranesp.  We will have dietician to see by      the renal diet.  We will discontinue the [employer].  Increase      his Lasix to 80 mg b.i.d.  2. I discussed with him the inevitability of renal replacement therapy      in the very near future.  He will see the videotapes before      proceeding with an access.  I did discuss with him the need for      access sometime in the very near future.  3. I expressly discussed with him the need for him to followup because      of his chronic kidney disease as a failure to do so could result in      significant health problems including death.   Thank you very much for the consult.  We will follow the patient with  you.      Dyke Maes, M.D.      Dyke Maes, M.D.     MTM/MEDQ  D:  01/30/2009  T:  01/31/2009  Job:  [id number]

## 2010-12-18 NOTE — Discharge Summary (Signed)
[name], CRUMBLE               ACCOUNT NO.:  [id number]   MEDICAL RECORD NO.:  [id number]          PATIENT TYPE:  INP   LOCATION:  1511                         FACILITY:  [facility]   PHYSICIAN:  Theodosia Paling, MD    DATE OF BIRTH:  1956-03-27   DATE OF ADMISSION:  04/25/2008  DATE OF DISCHARGE:                               DISCHARGE SUMMARY   INTERIM DISCHARGE SUMMARY   DATE OF DISCHARGE:  To be determined by discharging physician.   CHIEF COMPLAINT:  Primary care unassigned.   ADMITTING HISTORY:  Please refer to the Admission History from Dr.  Della Goo' note dictated April 25, 2008.   HOSPITAL COURSE:  The following issues were addressed during the  hospitalization:  1. Respiratory distress.  Patient initially received BiPAP treatment      along with nebulizer Atrovent.  His respiratory status resolved but      it was part of the delirium or alcohol withdrawal.  The chest x-ray      showed no acute infiltrate and patient's respiratory status quickly      resolved.  2. Acute renal failure.  Patient had acute and chronic renal failure.      The patient's creatinine went down to 2.1.  There were no      electrolyte or volume disturbances noted at admission.  Also, renal      consult was carried out as the patient had a history of 7 grams of      proteinuria, at this time it was nonproteinuria.  His creatinine      came back to 2, which is most likely his new baseline after IV      hydration.  3. Alcohol withdrawal.  The patient received p.r.n. Ativan.  At this      time, patient still is manifesting sign of alcohol withdrawal.  A      psych consult was sent for consideration of inpatient detox at      patient's request.  4. Opiate withdrawal.  Patient had abdominal cramping, diarrhea and      pupillary changes along with. Therefore, clonidine was started,      patient still had the symptoms.  Therefore, low-dose was added for      withdrawal and this can be  further evaluated by psychiatry.   DISCHARGE DIAGNOSES:  1. Respiratory distress, this is most likely secondary to alcohol      intoxication which resolved with nebulizer support, no evidence of      pneumonia.  2. Acute and chronic renal failure.  3. Alcohol withdrawal.  4. Opiate withdrawal.   DISCHARGE MEDICATIONS:  To be determined by discharging physician.   IMAGING PERFORMED:  Ultrasound of the kidneys:  Increased renal  echogenicity evaluated without any evidence of hydronephrosis or mass.  CT scan of the abdomen and pelvis did not show any acute abdominopelvic  event and an x-ray of the chest on April 24, 2008, showed some  bronchitic changes, no evidence of infiltrate.      Theodosia Paling, MD  Electronically Signed     NP/MEDQ  D:  05/01/2008  T:  05/01/2008  Job:  [id number]

## 2010-12-18 NOTE — Consult Note (Signed)
NAMENICOLAS, BANH               ACCOUNT NO.:  [id number]   MEDICAL RECORD NO.:  [id number]          PATIENT TYPE:  INP   LOCATION:  1511                         FACILITY:  [facility]   PHYSICIAN:  Coralyn Helling, MD        DATE OF BIRTH:  1955/09/29   DATE OF CONSULTATION:  05/04/2008  DATE OF DISCHARGE:                                 CONSULTATION   REFERRING PHYSICIAN:  Peggye Pitt, M.D.   REASON FOR CONSULTATION:  Hemoptysis.   HISTORY:  Mr. Maraj is a 55 year old male who was admitted on April 25, 2008 with symptoms of nausea, vomiting, fever, dyspnea and sputum  reduction.  He has an extensive history of substance abuse as well as  alcohol abuse.  He also carries a diagnosis of COPD.  On admission, he  was noted to have acute renal failure with a creatinine of 3.5.  He was  also found to have hepatitis C.  He had a CT scan of his chest done on  April 18, 2008 which was unremarkable.  This was done after he was  involved in an accident.  He says that he has been having problems with  nosebleeds recently, but denies having a nosebleed over the last several  days.  He says he has also been spitting up blood from his stomach as  well as having blood in stools.  He also has been having episodes of  cough with  clear-greenish sputum production with blood mixed in with it.  He  complains of diffuse body pain more prominent in his upper abdomen.  He  is not feeling nauseous.  He does  not feel short of breath.  He denies  any chest tightness or wheezing.  He is not currently feeling feverish.  He does have swelling in his legs bilaterally.   PAST MEDICAL HISTORY:  1. Significant for heroin abuse.  2. Cocaine abuse.  3. Alcohol abuse.  4. Narcotic abuse.  5. Benzodiazepine abuse.  6. COPD.  7. Hypertension.  8. Alcohol-related seizure  9. Chronic renal insufficiency.  His creatinine in September 2008 was      1.4.  10.Peripheral vascular disease.  11.Hepatitis C.   ALLERGIES:  NO KNOWN DRUG ALLERGIES.   CURRENT MEDICATIONS:  1. Ativan 1 mg q.4 h.  2. Lovenox 40 mg daily.  3. Multivitamin daily.  4. Nicoderm 21 mcg topically daily.  5. Protonix 40 mg q.h.s.  6. Thiamine 100 mg daily.   PAST SURGICAL HISTORY:  1. Right inguinal herniorrhaphy.  2. Carpal tunnel repair.   SOCIAL HISTORY:  He has 1 daughter.  He has a 48 pack year history of  smoking and continues to smoke cigarettes.   FAMILY HISTORY:  Significant that his mother had a stroke and his father  who passed away of old age.   PHYSICAL EXAMINATION:  GENERAL:  He is seen in his hospital room sitting  at the side of his bed.  He is awake, alert, does not appear to be in  acute distress.  VITAL SIGNS:  Blood pressure 122/74, heart rate is  84, respiratory rate  is 16, temperature is 98.4, oxygen saturation is 93% room air.  HEENT:  Pupils are reactive.  Extraocular muscles are intact.  There is  no sinus tenderness.  There is no nasal discharge.  There were no oral  lesions.  There is no lymphadenopathy.  No jugular venous distention.  No thyromegaly.  HEART:  S1-S2, no murmur.  CHEST:  Prolonged expiratory phase, but no wheezing or rales.  ABDOMEN:  Soft, mildly tender in the epigastric area, positive bowel  sounds.  EXTREMITIES:  He has 2+ edema up to his mid calves.  NEUROLOGIC:  He has normal strength.   DIAGNOSTICS:  Chest x-ray from May 02, 2008 shows hyperinflation  with scarring at the left base.   LABORATORY DATA:  Hemoglobin 9.3, hematocrit 27.2, platelet count is  190, WBC 3.4, sodium 137, potassium 5, chloride is 105, CO2 is 26, BUN  34, creatinine 2.7, glucose 131, calcium is 8, HIV was negative.   IMPRESSION:  Hemoptysis in the setting of epistaxis with possible  gastrointestinal bleed.  He may also have some degree of chronic  obstructive pulmonary disease with bronchitis.  He does also have a  history of hepatitis C.  Given the fact that he had a  relatively  unremarkable chest x-ray and CT scan of the chest, I am less inclined to  think that this would be related to a pulmonary renal syndrome.  What I  would recommend to do is to hold his Lovenox.  I would check his  coagulation parameters as well as his liver function tests.  I would  check his stool for fecal occult blood.  I would monitor him for the  development of epistaxis.  If he does develop epistaxis, he may also  need an ENT evaluation.  I would check his urine drug screen and I will  give him a course of azithromycin.  I would monitor his symptoms over  the next several days. If his hemoptysis is persistent or worsening,  then he may need airway inspection with bronchoscopy, but I do not think  that he would need that at the present time.      Coralyn Helling, MD  Electronically Signed     VS/MEDQ  D:  05/04/2008  T:  05/05/2008  Job:  [id number]

## 2010-12-18 NOTE — Op Note (Signed)
[name], [name]               ACCOUNT NO.:  [id number]   MEDICAL RECORD NO.:  [id number]          PATIENT TYPE:  AMB   LOCATION:  SDS                          FACILITY:  [facility]   PHYSICIAN:  Janetta Hora. Fields, MD  DATE OF BIRTH:  September 14, 1955   DATE OF PROCEDURE:  02/16/2009  DATE OF DISCHARGE:  02/16/2009                               OPERATIVE REPORT   PROCEDURE:  Left brachiocephalic arteriovenous fistula.   PREOPERATIVE DIAGNOSIS:  End-stage renal disease.   POSTOPERATIVE DIAGNOSIS:  End-stage renal disease.   ANESTHESIA:  General.   ASSISTANT:  Wilmon Arms, PA-C   OPERATIVE FINDINGS:  A 2.5-3 mm left cephalic vein.   OPERATIVE DETAILS:  After obtaining informed consent, the patient was  taken to the operating room.  The patient was placed in the supine  position on the operating room table.  After induction of general  anesthesia, the patient's entire left upper extremity was prepped and  draped in the usual sterile fashion.  A transverse incision was made  near the antecubital crease.  Incision was carried down through the  subcutaneous tissues down the level of the cephalic vein.  The cephalic  vein was approximately 2.5-3 mm in diameter.  This was dissected free  circumferentially and several side branches were ligated and divided  between silk ties.  The vein became quite small well below the  antecubital crease and it was decided to ligate it in this area and try  to swing this over to the level of the brachial artery.  This required  some mobilization of the skin and soft tissues to allow the vein to  swing freely over to the brachial artery.  There was minimal tension at  this point.  Vein was gently distended with heparinized saline and  marked for orientation.  Brachial artery was dissected free in the  medial portion of the incision.  The patient was given 5000 units of  intravenous heparin.  Of note, there was a large adjacent deep brachial  vein that  could possibly use for future access if needed.  Brachial  artery was controlled proximally and distally with vessel loops.  A  longitudinal opening was made in the brachial artery.  The vein was then  sewn end of vein to side of artery using a running 7-0 Prolene suture.  Just prior to completion of anastomosis, this was forebled, backbled,  and thoroughly flushed.  Anastomosis was secured.  Clamps were released.  There was palpable thrill below the fistula immediately.  Next,  subcutaneous tissues were reapproximated using running 3-0 Vicryl  suture.  Skin was closed with 4-0 Vicryl subcuticular stitch.  The  patient had a palpable radial pulse at the end of the case.  The patient  was taken to the recovery room in stable condition.      Janetta Hora. Fields, MD  Electronically Signed     CEF/MEDQ  D:  02/17/2009  T:  02/17/2009  Job:  [id number]

## 2010-12-18 NOTE — H&P (Signed)
NAME:  [name], ROTE NO.:  [id number]   MEDICAL RECORD NO.:  [id number]          PATIENT TYPE:  EMS   LOCATION:  ED                           FACILITY:  [facility]   PHYSICIAN:  Richarda Overlie, MD       DATE OF BIRTH:  1956/02/26   DATE OF ADMISSION:  06/07/2008  DATE OF DISCHARGE:                              HISTORY & PHYSICAL   SUBJECTIVE:  This is a 55 year old male with a history of polysubstance  abuse, COPD, chronic kidney disease who presents to the ER after  intractable nausea and vomiting that started a couple of days ago.  The  patient was discharged to [facility] on October 29 after being  admitted to the hospital between the October 27 and October 29 for  alcohol withdrawal .  He was treated with Ativan protocol and was  subsequently discharged in stable condition.  However, the patient has  not been able to eat and drink anything since his discharge, and also  the has fell 2 days ago, striking the left chest wall against the edge  of his bath tub.  The patient states that he simply tripped and lost his  balance, and since then, he has had excruciating pain along his left  lateral chest wall epigastric region as well as diffuse abdominal pain  because of recurrent nausea and vomiting.  He has also had two episodes  of bloody vomitus, coffee-ground emesis, none witnessed in the ER.  He  denies any symptoms of lightheadedness, shortness of breath, blood in  the stool or black tarry stools.  The patient, however, states that his  abdominal pain was present even during his previous hospitalization.  In  the ER, the patient was found to be clinically dehydrated with elevated  creatinine and is being admitted for aggressive IV hydration.   PAST MEDICAL HISTORY:  1. Polysubstance abuse.  2. COPD.  3. History of hypertension.  4. Chronic alcoholism.  5. Hyperlipidemia.  6. Hepatitis C.  7. Chronic kidney disease.  8. Bronchitis.   HOME MEDICATION:  1. Clonidine 0.1 ng twice daily.  2. Folic acid 1 mg daily.  3. Zyprexa 10 mg p.o. or IM b.i.d. p.r.n. for agitation not responding    to Ativan.  1. Ativan 1 mg p.o. every 6 hours p.r.n.  2. Thiamine 100 mg p.o. daily.  3. Protonix 40 mg daily.  4. Albuterol inhaler every 6 hours p.r.n.   ALLERGIES:  No known drug allergies.   SOCIAL HISTORY:  Currently smokes half a pack a day, heavy drinker with  a 87-month history of drug abuse.   FAMILY HISTORY:  Positive for substance abuse, but no history of  coronary artery disease, hypertension or diabetes.   REVIEW OF SYSTEMS:  A 10-point review of systems was done and is present  in HPI.   PHYSICAL EXAMINATION:  VITAL SIGNS:  Blood pressure 138/89, pulse 64,  respirations 16.  GENERAL:  The patient appears to be comfortable currently in no acute  distress.  HEENT:  Pupils equal and reactive.  Extraocular movements intact.  LUNGS:  Clear to auscultation bilaterally.  No wheezes or crackles.  CARDIOVASCULAR:  Regular rate and rhythm.  ABDOMEN:  Tender to palpation along the epigastric region.  The left  lateral chest wall minimal guarding noted.  No rebound, no shifting  dullness.  EXTREMITIES:  Without cyanosis, clubbing or edema.   LABORATORY DATA:  Sodium 135, potassium 12.3, chloride 106, bicarb 22,  BUN 44, creatinine 3.05, calcium 7.8, lipase 52.  CT scan of the abdomen  and pelvis shows no acute abdominal findings,No acute pelvic findings of  hemoperitoneum and pelvic fracture, acute abdominal series also done  today that shows increased  stool since prior exam.   ASSESSMENT/PLAN:  1. Intractable nausea and vomiting, probably secondary to viral      gastroenteritis or ileus .  I have not seen any evidence of      hematemesis.  If he patient does develop any evidence of      hematemesis, then GI consultation will be obtained.  Will start him      on PPI twice a day, Reglan and Phenergan for nausea.  His      medications were  reviewed.  His medications appear to be a      contributing factor such as zyprexa.  Will check amylase and lipase      today and in the morning.  The patient's CT scan was without      contrast, and therefore, because of his elevated creatinine, it is      possible that pancreatitis was not very evident on the CAT scan.      The patient's urinalysis was negative for nitrite and leukocyte      esterase.  Therefore, unlikely secondary to urinary tract infection      or pyelo.  2. Chronic kidney disease.  The patient's creatinine is elevated,      therefore, he has acute and chronic renal insufficiency likely      secondary to prerenal etiology and poor oral hydration since his      discharge.  Therefore, the patient will be hydrated with IV fluids.  3. History of polysubstance abuse, alcohol dependence.  The patient      will be monitored closely for any withdrawal symptoms and ER drug      screen will also be obtained and EtOH level will also be obtained.  4. Chronic hepatitis C.  This is stable.  5. History of hypertension.  Will hold clonidine unless the patient's      blood pressure is extremely elevated.      Richarda Overlie, MD  Electronically Signed     NA/MEDQ  D:  06/07/2008  T:  06/07/2008  Job:  [id number]

## 2010-12-18 NOTE — Discharge Summary (Signed)
[name], [name]               ACCOUNT NO.:  [id number]   MEDICAL RECORD NO.:  [id number]          PATIENT TYPE:  IPS   LOCATION:  0307                          FACILITY:  [facility]   PHYSICIAN:  Jasmine Pang, M.D. DATE OF BIRTH:  14-Feb-1956   DATE OF ADMISSION:  06/02/2008  DATE OF DISCHARGE:  06/07/2008                               DISCHARGE SUMMARY   IDENTIFYING INFORMATION:  This is a 55 year old separated white male who  was admitted on involuntary basis on June 02, 2008.   HISTORY OF PRESENT ILLNESS:  This is one of several [facility] admissions for  this 55 year old gentleman with a history of alcohol abuse, who  initially presented to the emergency department.  He was experiencing  some confusion and drank a large amounts of alcohol.  He stated he has  been drinking about a quart a day.  He had been at [facility] for treatment for alcohol.  Then went into acute withdrawal and  was transferred to the hospital for further evaluation.  He had some  resulting confusion, which was attributed to delirium from the  withdrawal, which was stabilized in the medical unit.  He received an  Ativan protocol.  He was transferred to our service on June 02, 2008.  Today, he is alert and states he has been taking about a court a day  recently.  He reports he was also detoxed from opiates in an acute care  unit a couple of weeks ago.  He was involuntarily petitioned because of  his delirium and concerned about possible lethal self-neglect.  He  denies any suicidal thoughts or dangerous ideas.   PAST PSYCHIATRIC HISTORY:  This is one of several [facility] admissions.  He  has a history of prior treatment at [facility] about 3 to 4 years ago.  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  He  has prior admissions at [facility] and some recent admissions  at [facility] over a year ago.  Most recently, he has been  in the [facility] [facility].   MEDICAL PROBLEMS:  1. Current alcohol abuse with delirium tremens.  2. 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  3. History of hepatitis C.  4. History of hyperlipidemia.  5. Chronic kidney disease.  6. History of opiate withdrawal.  7. Chronic obstructive pulmonary disease.   CURRENT MEDICATIONS:  1. Clonidine 0.1 mg twice daily, started in the medical unit to help      him cope with withdrawal.  2. Folic acid 1 mg daily.  3. Zyprexa 10 mg p.o. or IM b.i.d. p.r.n. for agitation.  4. Ativan 1 mg every 6 hours for withdrawal.  5. Thiamine 100 mg daily.  6. Protonix 40 mg daily.  7. Albuterol inhaler as needed.   MEDICATION ALLERGIES:  None.   PHYSICAL FINDINGS:  Physical exam was done in the medical unit.   DIAGNOSTIC STUDIES:  CBC revealed a hemoglobin of 12.2, platelets of  3000, MCH 9.6, WBC was 1.8.  INR was 1.  His white  count differential  was within normal limits.  Liver function tests were unremarkable except  for albumin of 2.8.  Serum chemistry was remarkable for creatinine of  2.6 and BUN of 26, otherwise unremarkable.  Urine drug screen was  positive for benzodiazepines and tricyclics.   HOSPITAL COURSE:  Upon admission, the patient was continued on clonidine  0.1 mg p.o. b.i.d., folic acid 1 mg p.o. daily, Zyprexa 10 mg b.i.d.  p.r.n. agitation, Ativan 1 mg p.o. q.6 hours p.r.n., thiamine 100 mg  daily, Protonix 40 mg daily, albuterol inhaler 2 puffs q.6 hours p.r.n.  He was also started on Ambien 5 mg p.o. q.h.s. p.r.n. insomnia, may  repeat x1 if needed.  He was started on Librium detox protocol as well.  Ativan was discontinued.  Zyprexa was discontinued.  He was prescribed  ibuprofen 600 mg p.o. q.i.d. for a painful left rib.  On June 06, 2008, ibuprofen was discontinued secondary to chronic kidney disease.  IM Phenergan was also discontinued.  Instead, he was started on Zofran  ODT 4 mg q.6 hours p.r.n. vomiting, Protonix  40 mg p.o. b.i.d., and  Bentyl 20 mg q.6 hours p.r.n. stomach upset.  He was also started on  Vistaril 50 mg p.o. q.i.d. p.r.n. anxiety.  He was given Kayexalate 30 g  p.o. now x1 for hyperkalemia.  He was also ordered Carafate 1 g q.i.d.  x24 hours.  In individual sessions with me, the patient was reserved,  but cooperative.  He states he had been here one time before.  He has  gone to NA and AA meetings at the [employer].  He states he has no  family support.  He states he has no outpatient treatment.  He was still  having symptoms of opiate detox, which was covered by the clonidine 0.1  mg p.o. b.i.d.  On June 04, 2008, he was experiencing a lot of nausea  and vomiting.  He was having multiple symptoms from the opiate detox.  He continued to feel bad.  He stated he feels bugs crawling on body.  His sleep was poor, appetite was poor.  He was having visual  hallucinations the chair will not move.  He feels his eyes were  playing tricks on him.  He was continued to feel bad and was depressed  and anxious.  A BMET was done and he was noted to have elevated  creatinine and potassium.  Due to his continued nausea and vomiting and  abnormal electrolytes, he was transferred to the [facility] ED for  further evaluation of his medical condition.  At that point, he was  admitted into the inpatient medical unit for further treatment of his  medical problems.   DISCHARGE DIAGNOSES:  Axis I:  Delirium tremens were evolving,  polysubstance dependence, opiate withdrawal.  Axis II:  None.  Axis III:  Hepatitis C, hyperlipidemia, hypertension, thrombocytopenia  secondary to alcohol abuse, hyperkalemia, elevated creatinine, nausea,  and vomiting.  Axis IV:  Severe (issues with homelessness, burden of chemical  dependence illness, burden of medical problems).  Axis V:  Global assessment of functioning was 46 upon discharge.  GAF  was 46 upon admission.  GAF highest past year was 60.    POSTHOSPITAL CARE PLANS:  The patient was admitted to [facility] Unit on June 07, 2008.      Jasmine Pang, M.D.  Electronically Signed     BHS/MEDQ  D:  06/15/2008  T:  06/16/2008  Job:  [id number] [phone]

## 2010-12-18 NOTE — H&P (Signed)
NAMETHOMSON, HERBERS               ACCOUNT NO.:  [id number]   MEDICAL RECORD NO.:  [id number]          PATIENT TYPE:  INP   LOCATION:  0104                         FACILITY:  [facility]   PHYSICIAN:  Zannie Cove, MD     DATE OF BIRTH:  1956-07-08   DATE OF ADMISSION:  01/19/2009  DATE OF DISCHARGE:                              HISTORY & PHYSICAL   PRIMARY CARE PHYSICIAN:  None.   CHIEF COMPLAINT:  Fall.   HISTORY OF PRESENTING ILLNESS:  Mr. Eakins is a 55 year old white male  with history of alcohol and polysubstance abuse.  Reports falling off  the ladder while trying to change a light bulb today.  Patient states  that he fell on his left side and came here because of severe pain on  the left side of his abdomen.  He denies chest pain, shortness of  breath, nausea, vomiting, diarrhea.  Denies fevers, chills.  Reports  does not see a primary care physician and does not take any medications  as well.  He said he drinks alcohol only on weekends and denies any  illegal drugs.   PAST MEDICAL HISTORY:  1. Alcohol abuse.  2. COPD.  3. Polysubstance abuse.  4. Hepatitis C.  5. Chronic kidney disease.  6 .  Thrombocytopenia.  1. Medication noncompliance.   PAST SURGICAL HISTORY:  None.   MEDICATIONS:  None.   ALLERGIES:  No known drug allergies.   SOCIAL HISTORY:  Lives at home with his wife.  The patient reports that  he drinks several beers only on weekends and reports he has not drank  since this weekend.  However, I doubt that.  In addition he smokes one  pack per day for 34 years and denies other illegal or recreational  drugs.   FAMILY HISTORY:  Noncontributory.   REVIEW OF SYSTEMS:  12 system review negative except per HPI.   EXAMINATION:  Temperature 98, pulse 107, blood pressure 167/89,  respirations 20, satting 97% on 2 liters.  GENERAL EXAM:  Alert, awake, oriented x3, in moderate distress due to  pain.  HEENT:  Pupils equal and reactive to light.  Extraocular  movements  intact.  CARDIOVASCULAR:  S1,S2.  Regular rate, rhythm, tachycardic.  LUNGS:  Clear to auscultation.  ABDOMEN:  Tense with severe left-sided tenderness.  Positive bowel  sounds.  No rebound.  Mild rigidity present.  Bruises and ecchymosis  noted on the left side.   CBC:  White count 4.7, hemoglobin 9.6, platelets 139.  Sodium 138,  potassium 6.4, chloride 114, bicarb 18, BUN 58, creatinine 4.2, glucose  78.  Baseline creatinine is 3.6-4.4.  Total protein 6.4, albumin 2.7.  Tox screen positive for benzos.  Urinalysis with greater than 300  protein.  Alcohol level of 83.  CT head:  No acute disease.  CT spine  with question dens fracture.  CT chest, abdomen, pelvis:  No acute  findings.  EKG:  Normal sinus rhythm.  Rate 82, tall T waves in leads V1-  V6.   ASSESSMENT/PLAN:  [age]-year-old male with:  1. Hyperkalemia due to chronic kidney  disease in the setting of      medication and diet noncompliance.  We will give him another dose      of IV calcium gluconate, insulin dextrose and Kayexalate.  We will      also check CPK to rule out rhabdomyolysis.  We will repeat a      potassium level in 2-3 hours and monitor him on telemetry.  2. History of fall/left-sided abdominal pain likely secondary to      contusion, bruising.  No acute abnormalities per CT scan check.      Follow up.  Pain control with Tylenol, IV Dilaudid.  There is a      question of dens fracture on CT spine.  Continue [place] J collar for      now.  Repeat CAT scan in 1-2 days.  3. Chronic kidney disease, stable.  Will need outpatient followup to      set up dialysis sooner rather than later.  4. Alcohol abuse.  Hydrate with thiamine, folic acid, CIWA protocol      and substance abuse counseling.  5. Deep vein thrombosis prophylaxis, Lovenox.  6. Code status:  The patient is full code.      Zannie Cove, MD  Electronically Signed     PJ/MEDQ  D:  01/19/2009  T:  01/19/2009  Job:  [id number]

## 2010-12-18 NOTE — Assessment & Plan Note (Signed)
OFFICE VISIT   [name], [name]  DOB:  11-25-1955                                       08/09/2010  ZOXWR#:[id number]   The patient was scheduled for evaluation of his left forearm AV graft  for pain.  He was a no-show for the office visit.     Janetta Hora. Fields, MD  Electronically Signed   CEF/MEDQ  D:  08/09/2010  T:  08/10/2010  Job:  [id number]   cc:   [facility]

## 2010-12-18 NOTE — H&P (Signed)
[name], [name]               ACCOUNT NO.:  [id number]   MEDICAL RECORD NO.:  [id number]          PATIENT TYPE:  INP   LOCATION:  1511                         FACILITY:  [facility]   PHYSICIAN:  Della Goo, M.D. DATE OF BIRTH:  Jan 12, 1956   DATE OF ADMISSION:  04/25/2008  DATE OF DISCHARGE:                              HISTORY & PHYSICAL   PRIMARY CARE PHYSICIAN:  Unassigned.   CHIEF COMPLAINT:  Shortness of breath, nausea, vomiting.   HISTORY OF PRESENT ILLNESS:  This is a 55 year old male who was just  admitted to the [facility] 1 day ago who was sent from  there to the emergency department, [facility] ED, for evaluation  secondary to illness for the past 8 days per his report.  He reports  having nausea, vomiting, shortness of breath and coughing up greenish  blood-tinged mucous.  The patient also reports having fevers, chills.  He denies having any diarrhea.  He also denies having any chest pain.  The patient has a history of polysubstance abuse, a history of heroin  abuse, cocaine abuse, alcohol abuse and narcotic pain medication  OxyContin, Percocet and also a history of benzodiazepine abuse for such.  Patient has been seen in the [facility] system on multiple occasions for  polysubstance abuse.   Also, patient had a recent evaluation in the emergency department  September 18th for evaluation of abdominal pain status post a fall off a  7 foot ladder.  A CT scan of the chest, abdomen and pelvis were  performed to evaluate for contusions and fractures and were found to be  negative for fractures or any evidence of internal bleeding.  Also of  note, per the ED record at that time, it was noted that the patient has  had over 15 CAT scans performed of the chest, abdomen and pelvis for  presentations for falls off ladders.  It was also further noted by the  EDP that patient was confronted with probable narcotic pain medication-  seeking and the need  for an opioid treatment rehab program.   PAST MEDICAL HISTORY:  1. History of polysubstance abuse and alcohol abuse.  2. COPD.  3. Hypertension.  4. History of alcohol-related seizures.  5. Chronic, atypical chest pain.  6. Chronic kidney disease.  7. Peripheral vascular disease.  8. Hyperlipidemia.  9. Hepatitis C.  10.Tobacco abuse.   MEDICATIONS:  None.   ALLERGIES:  No known drug allergies.   SOCIAL HISTORY:  As mentioned above, polysubstance abuse, alcohol abuse,  tobacco abuse.   FAMILY HISTORY:  Unable to obtain at this time.   REVIEW OF SYSTEMS:  Pertinents are mentioned above.   PHYSICAL EXAMINATION FINDINGS:  This is a 55 year old disheveled-  appearing thin male in acute distress.  VITAL SIGNS:  Temperature 98.2, blood pressure 111/63, heart rate 97,  respirations 15 while on BiPAP.  His O2 saturations initially at 9:30  p.m. were found to be 75% on room air and this was prior to initiation  of supplemental oxygen and then BiPAP therapy.  HEENT:  Normocephalic, atraumatic.  Pupils  equally round, reactive to  light.  Extraocular movements are intact.  There is no scleral icterus.  Oropharynx is clear.  NECK:  Supple, full range of motion.  No thyromegaly, adenopathy,  jugular venous distention.  CARDIOVASCULAR:  Tachycardiac rate and rhythm.  No murmurs, gallops,  rubs.  LUNGS:  Clear to auscultation bilaterally.  ABDOMEN:  Positive bowel sounds, soft, nontender, nondistended.  EXTREMITIES:  Without cyanosis, clubbing or edema.  NEUROLOGIC EXAMINATION:  Nonfocal.   LABORATORY STUDIES:  White blood cell count 7.2, hemoglobin 10.0,  hematocrit 28.9, platelets 141, sodium 138, potassium 5.1, chloride 107,  bicarb 23.  BUN 43, creatinine 3.4 and glucose 159.  Alcohol level less  than 5.  Arterial blood gases performed initially at 2211 revealed a pH  level of 7.301, a pCO2 of 45.6, a pO2 of 46.0, bicarb 22.5 and an O2  saturation of 78%.  This was reported as  being arterial.  The next blood  gas performed at 1:30 a.m. on BiPAP, with an FIO2 of 35%, revealed a pH  of 7.23, a pCO2 of 49.3, a pO2 of 76.9, a bicarb of 19.9 and O2  saturation of 94.9%.  Chest x-ray findings revealed bronchitic changes.   ASSESSMENT:  A 55 year old male being admitted with:  1. Respiratory distress.  2. Bronchitis versus influenza.  3. Chronic obstructive pulmonary disease exacerbation.  4. Chronic kidney disease with acute renal failure.  5. Alcohol withdrawal.  6. Polysubstance abuse.  7. Anemia.   PLAN:  The patient has been admitted and placed on droplet precautions.  IV antibiotic therapy initially was Zosyn, this was changed to Avelox  for increased atypical coverage.  Patient has been continued on BiPAP  therapy along with nebulizer treatments albuterol and Atrovent therapy.  An IV steroid taper has also been ordered.  The patient has been placed  on IV fluids for now it is n.p.o. and patient will also be placed on the  IV Ativan protocol and a clonidine patch has also been ordered for  withdrawal.  DVT and GI prophylaxis have also been ordered along with  antiemetic therapy p.r.n.  A nicotine patch will also be ordered for  tobacco withdrawal.  During this hospitalization patient will be further  evaluated by psychiatry.      Della Goo, M.D.  Electronically Signed     HJ/MEDQ  D:  04/25/2008  T:  04/25/2008  Job:  [id number]

## 2010-12-18 NOTE — Discharge Summary (Signed)
[name], [name]               ACCOUNT NO.:  [id number]   MEDICAL RECORD NO.:  [id number]          PATIENT TYPE:  INP   LOCATION:  1503                         [facility]AC[facility]  [facility]   PHYSICIAN:  Hind I Elsaid, MD      DATE OF BIRTH:  17-Dec-1955   DATE OF ADMISSION:  05/31/2008  DATE OF DISCHARGE:  06/02/2008                               DISCHARGE SUMMARY   DISCHARGE DIAGNOSES:  1. Alcohol abuse with delirium tremens.  2. Recurrent thrombocytopenia felt to be secondary to alcoholism.  3. History of hepatitis C.  4. History of hyperlipidemia.  5. Result of delirium felt to be secondary to alcohol.  6. Chronic kidney disease.  7. History of opiate withdrawal.  8. History of chronic obstructive pulmonary disease.   DISCHARGE MEDICATION:  1. Clonidine 0.1 ng twice daily.  2. Folic acid 1 mg daily.  3. Zyprexa 10 mg p.o. or IM b.i.d. p.r.n. for agitation not responding      to Ativan.  4. Ativan 1 mg p.o. every 6 hours p.r.n.  5. Thiamine 100 mg p.o. daily.  6. Protonix 40 mg daily.  7. Albuterol inhaler every 6 hours p.r.n.   CONSULTATIONS:  Dr. Jeanie Sewer consulted.   HISTORY OF PRESENT ILLNESS:  This is a 55 year old Caucasian male who  was recently discharged form the hospital with a history of opiate  withdrawal, COPD and chronic kidney disease.  Patient admitted to  [facility] facility for alcohol detox, but went into acute  withdrawal and has delirium tremor and was transferred to the hospital  for further evaluation.  The patient admitted for:  1. Delirium with alcohol withdrawal.  Patient was started on Ativan      protocol with thiamine, folate and multivitamin.  During hospital      stay patient had resultant confusion and delirium which could be      related to the affect of heavy alcohol abuse.  Neurologic      examination was intact.  Also could be related to beer alcohol      withdrawal delirium or it could be to Korsakoff's syndrome.  For      that, Dr.  Jeanie Sewer evaluated the patient and recommended inpatient      psych unit as soon as possible and he recommended Zyprexa.  We will      rule out any underlying infection for his delirium.  Ammonia level      at this time is pending.  His liver function is normal.  2. We added Clonidine which would help with withdrawal too.  3. Chronic kidney disease with the creatinine remained at patient's      baseline and we recommend followup on      discharge from [facility] with Dr. __________  from [facility].  4. COPD.  Remained stable.  5. History of polysubstance abuse to be followed at [facility].      [name], MD  Electronically Signed     HIE/MEDQ  D:  06/02/2008  T:  06/02/2008  Job:  [id number]

## 2010-12-18 NOTE — H&P (Signed)
NAMEGERRIT, [name]               ACCOUNT NO.:  [id number]   MEDICAL RECORD NO.:  [id number]          PATIENT TYPE:  INP   LOCATION:  2018                         FACILITY:  [facility]   PHYSICIAN:  Della Goo, M.D. DATE OF BIRTH:  09-29-1955   DATE OF ADMISSION:  01/24/2009  DATE OF DISCHARGE:                              HISTORY & PHYSICAL   DATE OF ADMISSION:  January 24, 2009.   PRIMARY CARE PHYSICIAN:  Unassigned.   CHIEF COMPLAINT:  Patient wishes to have heroin detox treatment.   HISTORY OF PRESENT ILLNESS:  This is a 55 year old male, who presents to  the emergency department with complaints of severe shakiness, nausea,  vomiting, abdominal pain, and reports that he last used heroin and  narcotics such as OxyContin, oxycodone 3 days ago.  He states that he is  not prescribed narcotic medication and takes street medications.  He  states that he is out of money and he is tired of going through what he  is going through taking the substances. Patient reports that he also  drinks alcohol but does that when he is not able to get heroin or the  narcotic medication.  He states that when he does drink, he does drink  40 to 80 ounces of beer to relieve the tremors. Patient reports not  being able to hold down much food or liquids.  He reports vomiting each  time.  He denies having any hematemesis.  He denies constipation and  does report having loose stools.  He denies having any syncope or  seizures.  Patient reports having chills and shakes.   The patient reports having a habit of 1 bundle daily injected of heroin.  He also reports taking Valium 100 mg daily drinking two 40s of  alcohol/beer.  The EKG will be located and reviewed.   PAST MEDICAL HISTORY:  Significant for:  1. Hepatitis C.  2. Alcoholism.  3. Chronic renal insufficiency.  4. A reported history of a myocardial infarction.   MEDICATIONS:  No prescribed medications.   ALLERGIES:  No known drug allergies.   SOCIAL HISTORY:  Patient is a smoker, drinker, and he also reports using  heroin and oxycodone.   FAMILY HISTORY:  Noncontributory to this patient.   REVIEW OF SYSTEMS:  Pertinents are mentioned above in the HPI.   PHYSICAL EXAMINATION FINDINGS:  GENERAL:  This is a 55 year old, thin,  cachectic-appearing male, who was tremulous and in discomfort, but no  acute distress currently.  VITAL SIGNS:  Temperature 99.4, blood pressure 166/66, heart rate 87,  respirations 16 to 22, O2 SAT is 97 to 100%.  HEENT:  Normocephalic, atraumatic.  Pupils equally round and reactive to  light.  There is no scleral icterus.  Extraocular movements are intact.  Funduscopic benign.  Nares are patent bilaterally.  Oropharynx is clear.  Dentition is poor.  NECK:  Supple, full range of motion, no thyromegaly, adenopathy,  jugulovenous distention.  CARDIOVASCULAR:  Regular rate and rhythm, no  murmurs, gallops, rubs.  LUNGS:  Clear to auscultation bilaterally.  ABDOMEN:  Positive bowel sounds, soft, nontender, and  nondistended.  EXTREMITIES:  Without cyanosis, clubbing, or edema.  NEUROLOGIC:  The patient is tremulous.  He is alert and oriented x3.  His speech is clear.  He is able to move all 4 of his extremities.  His  motor and sensory function are intact.   LABORATORY STUDIES:  White blood cell count 3.2, hemoglobin 10.1,  hematocrit 29.6, platelets 154, neutrophils 39%, lymphocytes 38%.  Alcohol level 61, phosphorus 5.6, magnesium 1.6.  Sodium 138, potassium  5.5, chloride 111, carbon dioxide 18, BUN 45, creatinine 4.31, and  glucose 76.  Point-of-care cardiac markers with a myoglobin of 336, CK-  MB 2.6, and troponin less than 0.05.  Urine drug screen positive for  benzodiazepine.  Urinalysis is negative except greater than 300 total  protein and trace hemoglobin.   ASSESSMENT:  A 55 year old male being admitted with:  1. Heroin withdrawal.  2. Alcohol withdrawal.  3. Narcotic withdrawal.  4.  Chronic renal insufficiency and acute renal failure.  5. Normocytic anemia.  6. Leukopenia.   PLAN:  Patient will be admitted to a telemetry area.  Cardiac enzymes  will be performed.  Patient will be placed on the IV Ativan withdrawal  protocol, and clonidine has been ordered as well for withdrawal  symptoms.  The patient will be placed on clear liquids, and IV fluids  have been ordered for fluid resuscitation and maintenance therapy.  Substance abuse counseling will be ordered, and a consultation will be  placed for this patient to receive detox treatment placement.  Patient  will be placed on DVT and GI prophylaxis.  Further workup will ensue  pending patient's clinical course.      Della Goo, M.D.  Electronically Signed     HJ/MEDQ  D:  01/24/2009  T:  01/24/2009  Job:  [id number]

## 2010-12-18 NOTE — H&P (Signed)
NAMEMELVERN, [name].:  [id number]   MEDICAL RECORD [name].:  [id number]          PATIENT TYPE:  IPS   LOCATION:  0402                          FACILITY:  [facility]   PHYSICIAN:  Anselm Jungling, MD  DATE OF BIRTH:  Dec 25, 1955   DATE OF ADMISSION:  01/14/2009  DATE OF DISCHARGE:                       PSYCHIATRIC ADMISSION ASSESSMENT   IDENTIFYING INFORMATION:  This is a 55 year old divorced white male.  Apparently he stopped drinking on January 11, 2009.  He was tremulous.  He  complained of nausea and vomiting, unspecified hallucinations.  He  smelled of alcohol.  In face to face assessment by the ED doctor he  appeared to be in acute distress and he reports that he has also been  abusing benzos.  His alcohol level was 171.  His UDS was negative.   PAST PSYCHIATRIC HISTORY:  He has had multiple detoxifications at  various institutions include [facility], [facility], and the [facility].  His most recent  inpatient detox was at [facility] in October of 2009.   SOCIAL HISTORY:  He is homeless.  He has [name] income.   FAMILY HISTORY:  Negative.   PRIMARY CARE PHYSICIAN:  None.   MEDICAL PROBLEMS:  He reports that he used to take Methadone.  He has  chronic renal failure.   MEDICATIONS:  As far as we know he states that he is taking Methadone 15  mg p.o. daily; however, it is unclear whether he just buys that off the  street or has somehow gotten a prescription.   ALLERGIES:  [name] known drug allergies.   POSITIVE PHYSICAL FINDINGS:  He appears to be an addict.  His physical  habitus is representative of this.  VITAL SIGNS:  His pulse was 82-94, respirations 18-20, temperature was  normal 97.4-98.2, and blood pressure ranged from 123/65 to 148/83.   MENTAL STATUS EXAM:  He had to be roused to get out of bed.  He appeared  somewhat unkempt, malnourished, chronically ill.  His speech was soft  and slow.  His mood was  appropriate to the situation.  His affect was  depressed and sleepy.  Thought processes are clear, rational, and goal-  oriented.  He wants to be taken care of.  Judgment and insight are poor.  Concentration and memory are intact.  He  denied being suicidal or homicidal.  He denied auditory/visual  hallucinations.  He was started on the clonidine as well as Librium  protocol given his report for methadone, etc., and his estimated length  of stay is 3-5 days.  The case manager will work on disposition.      [name], P.A.-C.      Anselm Jungling, MD  Electronically Signed    MD/MEDQ  D:  01/14/2009  T:  01/14/2009  Job:  [id number]

## 2010-12-18 NOTE — Consult Note (Signed)
[name], [name]               ACCOUNT NO.:  [id number]   MEDICAL RECORD NO.:  [id number]          PATIENT TYPE:  INP   LOCATION:  1419                         FACILITY:  [facility]   PHYSICIAN:  Antonietta Breach, M.D.  DATE OF BIRTH:  1956-05-07   DATE OF CONSULTATION:  06/09/2008  DATE OF DISCHARGE:                                 CONSULTATION   [name] has recovered from his disorganized thought process, his  impaired judgment and his disorientation.   He did undergo an admission to [facility] and had to be  transferred back to the medical ward.   He is cooperative with staff.  He is not having any thoughts of harming  himself or others.  He has no hallucinations or delusions.  His  orientation function is intact.  His memory function is intact.   He has constructive future goals, normal interests and hope.  He does  not have depressed mood.   MENTAL STATUS EXAM:  As above, [name] is socially appropriate and  cooperative.  Thought process is logical, coherent, and goal-directed.  Mood and affect normal.  Thought content as above is normal.  Judgment  is intact.  Insight is intact.  He is alert with good attention and  concentration.   ASSESSMENT:  AXIS I:  293.00 delirium not otherwise specified, now  resolved.  Polysubstance dependence.   [name] is not at risk to harm himself or others.  He agrees to call  emergency services immediately for any thoughts of harming himself,  thoughts of harming others, or distress.   The undersigned provided ego supportive psychotherapy, reinforcement of  12-step principles.   RECOMMENDATIONS:  [name] is now motivated to return to his [facility]  program and to continue the 12-step method.   Will ask the social worker to arrange an admission for [name] back to  a chemical dependence residential treatment facility.      Antonietta Breach, M.D.  Electronically Signed     JW/MEDQ  D:  06/09/2008  T:   06/09/2008  Job:  [id number]

## 2010-12-18 NOTE — Consult Note (Signed)
[name], [name]               ACCOUNT NO.:  [id number]   MEDICAL RECORD NO.:  [id number]          PATIENT TYPE:  INP   LOCATION:  1432                         FACILITY:  [facility]   PHYSICIAN:  Antonietta Breach, M.D.  DATE OF BIRTH:  [birth year]   DATE OF CONSULTATION:  01/29/2007  DATE OF DISCHARGE:                                 CONSULTATION   CONSULTATION FOLLOWUP:   REASON FOR CONSULTATION:  Polysubstance dependence.   Mr. Dunklee has been evaluated and treated by the [facility] psychiatric  staff multiple times on the medical wards as well as in the [facility].  Please see the report of his psychiatric  admission on the chart.   Mr. Brandy has had multiple relapses since his last psychiatric admission  at [facility] in May 2006.   He was admitted this time on January 28, 2007, having relapsed on 10 bags  of heroin daily.  He also has been drinking at least 6 beers a day and  has also been injecting cocaine whenever he can get it.   He is not having any suicidal thoughts.  He has no thoughts of harming  others.  He is not having any hallucinations or delusions.  He is on day  3 of heroin withdrawal and is on the clonidine protocol.  He is having  some breakthrough muscle aches and malaise.   He is motivated for abstinence and wants to pursue a residential  chemical dependency rehabilitation program if available versus an  outpatient chemical dependency rehabilitation program.   MENTAL STATUS EXAM:  Mr. Severs is an alert middle-aged male with an  anxious affect.  His mood is within normal limits.  His thought process  is logical, coherent, goal directed.  No looseness of associations.  Thought content:  No thoughts of harming himself.  No thoughts of  harming others.  No delusions, no hallucinations.  He is oriented to all  spheres.  His memory is intact to immediate, recent, and remote.  His  judgment is intact.  His insight is good.   ASSESSMENT:  1. Opioid  dependence.  2. Polysubstance dependence.   RECOMMENDATIONS:  Would finish the opioid detox protocol and would ask  the social worker to set this patient up with the new government-  sponsored residential chemical dependency program in town.  Information  can be obtained at [phone].  Of note, this program is new and separated  from a ADS.  The behavioral health intake department will have  information on at.      Antonietta Breach, M.D.  Electronically Signed     JW/MEDQ  D:  01/29/2007  T:  01/30/2007  Job:  [id number]

## 2010-12-18 NOTE — Discharge Summary (Signed)
[name], [name]               ACCOUNT NO.:  [id number]   MEDICAL RECORD NO.:  [id number]          PATIENT TYPE:  INP   LOCATION:  1504                         FACILITY:  [facility]   PHYSICIAN:  Isidor Holts, M.D.  DATE OF BIRTH:  04/11/1956   DATE OF ADMISSION:  01/19/2009  DATE OF DISCHARGE:  01/23/2009                               DISCHARGE SUMMARY   Left AMA.   PRIMARY MD:  Gentry Fitz.   DISCHARGE DIAGNOSES:  1. Alcohol abuse/withdrawal.  2. Status post fall, with left-sided abdominal wall contusion.  No      bony injuries.  3. Smoking history.  4. Hepatitis C.  5. Pancytopenia.  6. Chronic kidney disease.  7. Hypertension.   DISCHARGE MEDICATIONS:  Not feasible, as the patient left AMA.   PROCEDURES:  1. Head CT scan done January 19, 2009.  This showed no acute intracranial      abnormality.  2. X-ray cervical spine done January 19, 2009.  This showed query dens      fracture.  Follow-up CT was suggested.  3. CT C-spine done January 19, 2009.  This showed no fracture.  There was      spondylosis most pronounced on the left at C5-C6 where there is      foraminal encroachment that could be symptomatic.  4. Chest CT scan January 19, 2009.  This showed no traumatic finding of      the chest.  A few benign-appearing pulmonary densities are      unchanged since the previous exam.  Area of patchy density in the      left lower lobe laterally could represent atelectasis or residual      of prior pneumonia.  5. Abdominal CT scan done January 19, 2009.  This showed no traumatic      finding in the abdomen.  6. Pelvic CT scan done January 19, 2009.  This was a negative CT scan.   CONSULTATIONS:  Dr. Arlean Hopping, nephrologist.   ADMISSION HISTORY:  As in H and P notes of January 19, 2009, dictated by  Dr. Althea Charon.  However, in brief,  this is a 55 year old male, with  known history of alcohol abuse, COPD, polysubstance abuse, hepatitis C,  chronic kidney disease, thrombocytopenia and medical  noncompliance,  presenting following an 8-foot fall off a ladder, while trying to change  a light bulb on the day of presentation.  He fell on his left side and  presented essentially because of pain in the left side of the abdomen.  There was no associated chest pain, shortness of breath, fevers, chills,  vomiting or diarrhea.  He was admitted for further evaluation,  investigation and management.   CLINICAL COURSE:  1. Alcohol abuse/withdrawal.  The patient states emphatically that he      binge drinks only on weekends; however, by day #2 of      hospitalization he was found to be very, very tremulous.  He did      not appear to have altered mental status, but his alcohol      withdrawal phenomena were sufficiently concerning  for him to be      transferred to the step-down unit.  He was managed with intravenous      Ativan and by January 21, 2009, tremulousness had considerably      ameliorated and he was transferred to the general medical floor,      subsequently placed on Ativan taper and over the course of the next      few days, alcohol withdrawal phenomena resolved.  He has been      counseled appropriately with regards to chronic alcohol abuse, and      vitamin supplements were utilized during the course of this      hospitalization.   1. Status post fall.  For details of presentation, refer to admission      history above.  The patient did present because of left-sided      abdominal pain, following the fall.  However, imaging studies      showed no evidence of bony injuries.  He had been applied a rigid      neck collar in the emergency department.  Initial neck x-ray      suggested a possible dens fracture.  This was not substantiated by      follow-up CT scan.  Rigid collar was therefore removed, without any      deleterious consequences and the patient did not complain of neck      pain.   1. Chronic kidney disease.  The patient, on initial evaluation, was      found to  have a BUN of 58, creatinine 4.2.  He was managed with      intravenous fluid hydration and renal function remained relatively      stable throughout the course of hospitalization.  As a matter of      fact, on January 18, 2009, BUN was 44 with a creatinine of 3.89.  This      appears to be patient's baseline.  As a matter of fact, abdominal      imaging studies showed no concerning kidney abnormalities.  There      was a contour deformity of the upper part of the left kidney which      appeared the same as previous examinations.  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  The patient did have a potassium of 6.1 at the time of      initial presentation.  This responded to a combination of      intravenous Calcium gluconate, insulin, dextrose and Kayexalate.      His potassium level of January 18, 2009, was 5.0.   1. Smoking history.  The patient is a smoker, and smokes approximately      1 pack of cigarettes per day.  He was counseled appropriately, and      managed with Nicoderm CQ patch during the course of this      hospitalization.   1. History of hepatitis C.  The patient's liver function tests      appeared normal during the course of this hospitalization.  As a      matter of fact, on January 23, 2009, his alkaline phosphatase was 61,      AST 27, ALT 29.  He did have a pancytopenia with hemoglobin of 9.0,      hematocrit 25.8, platelets 110.  On January 22, 2009, iron studies      showed the following findings:  Iron 145, TIBC 270, percent      saturation 54, ferritin of 64.  These findings appeared consistent      with anemia of chronic disease likely related to his hepatitis C      and probably also to chronic kidney disease.  His hepatitis B      serology, as well as HIV testing were negative.   1. Hypertension.  The patient was found to be hypertensive during the      initial course of his hospitalization.   Hypertension was controlled      with a combination of Clonidine 0.2 mg t.i.d. and Norvasc 10 mg      p.o. daily.   DISPOSITION:  This could not be properly effected, as the patient left  AMA on January 23, 2009, a.m.  He had clearly achieved clinical stability.  Nephrology consultation has been called on January 28, 2009.  I had  discussed the patient's clinical case with Dr. Arlean Hopping, who had assured  me that the patient will be seen by renal team on January 23, 2009 prior to  discharge, the objective being to acquaint him with the nephrologist and  establish outpatient followup.  Clearly, as the patient's creatinine was  2.23 in October 2009, there has been interval worsening of his renal  function.  This was discussed in detail with the patient on January 22, 2009, and at that time he appeared agreeable.  However, as mentioned  above, the patient left AMA in the a.m. of January 23, 2009 and, therefore,  renal consultation did not occur.      Isidor Holts, M.D.  Electronically Signed    CO/MEDQ  D:  01/24/2009  T:  01/24/2009  Job:  [id number]

## 2010-12-18 NOTE — Assessment & Plan Note (Signed)
OFFICE VISIT   [name], [name]  DOB:  1956-01-15                                       08/11/2009  OZDGU#:[id number]   The patient presents today for evaluation of his right hand.  He is  status post right Cimino AV fistula creation yesterday.  He has a Diatek  catheter and went to dialysis today.  He was complaining of severe pain  and also numbness and weakness in his right hand and was sent from  dialysis to our office.   PHYSICAL EXAMINATION:  He does have some mild erythema at the site of  his fistula.  He does have an audible bruit.  He has coolness in his  right hand compared to his left and has minimal motor or sensory  function in his hand and is somewhat cyanotic as well.  I discussed this  with the patient and his family present.  I explained that I would  recommended urgent ligation of his fistula due to his severe steal.  He  will present immediately to [facility] this afternoon for  ligation.     Larina Earthly, M.D.  Electronically Signed   TFE/MEDQ  D:  08/11/2009  T:  08/14/2009  Job:  [id number]

## 2010-12-18 NOTE — Discharge Summary (Signed)
[name], [name] NO.:  [id number]   MEDICAL RECORD NO.:  [id number]          PATIENT TYPE:  IPS   LOCATION:  0402                          [facility]ACILITY:  [facility]   PHYSICIAN:  Jasmine Pang, M.D. DATE OF BIRTH:  05/20/1956   DATE OF ADMISSION:  01/14/2009  DATE OF DISCHARGE:  01/18/2009                               DISCHARGE SUMMARY   IDENTIFICATION:  This is a 55 year old divorced white male who was  admitted on a voluntary basis on January 14, 2009.   HISTORY OF PRESENT ILLNESS:  The patient stated he stopped drinking on  January 11, 2009.  He was tremulous, and he complained of nausea and  vomiting.  He also was complaining of unspecified hallucinations.  He  smelled of alcohol.  In the face-to-face assessment by the ED doctor, he  appeared to be in acute distress and reported that he has been abusing  benzo.  His alcohol level was 171.  His UDS was negative.  The patient  has had multiple detoxes.  At various institution, including [facility], [facility], and [facility].  His most recent inpatient detox was at [facility] in  October 2009.  For further admission information, see psychiatric  admission assessment.   PHYSICAL FINDINGS:  There were no acute physical or medical problems  noted.   HOSPITAL COURSE:  Upon admission, the patient was started on Robaxin  1000 mg p.o. b.i.d., p.r.n. for severe joint and muscle aches.  He was  also started on Librium detox protocol and the clonidine detox protocol.  In individual sessions, the patient was disheveled with minimal eye  contact.  There was psychomotor retardation.  Speech was soft and slow.  He was drowsy and lethargic.  Mood was depressed and anxious.  Affect  consistent with mood.  No evidence of psychosis or thought disorder.  On  January 15, 2009, the patient was tremulous and feeling sick.  He was  having hard withdrawal.  He was utilizing all the  p.r.n. and says he  cannot keep food down.  He was denying suicidal ideation.  On January 16, 2009, he was complaining of severe stomach cramps, nausea, and vomiting.  His sleep was poor, appetite was poor.  He continued to have a difficult  detox.  By January 17, 2009, he was less nauseated.  He continued to be  depressed and anxious.  Sleep was still poor.  He was started on Ambien  10 mg p.o. q.h.s. p.r.n. insomnia.  On January 18, 2009, the patient felt  much better.  His sleep was good.  Appetite was fair.  His mood was less  depressed, less anxious.  Affect was consistent with mood.  There was no  suicidal or homicidal ideation.  No thoughts of self-injurious behavior.  No auditory or visual hallucinations.  No paranoia or delusions.  Thoughts were logical and goal-directed.  Thought content, no  predominant theme.  The patient stated I feel great.  He wanted to go  home and was felt to be safe for discharge.  DISCHARGE DIAGNOSES:  Axis I:  Polysubstance abuse.  Axis II:  Features of personality disorder, not otherwise specified.  Axis III:  Chronic renal failure.  Axis IV:  Severe (problems with primary support group, burden of  chemical dependence illness, burden of medical problems, other  psychosocial stressors).  Axis V:  Global assessment of functioning was 50 upon discharge. GAF  upon admission was 35.  GAF highest past year of 60-65.   DISCHARGE PLAN:  There was no specific activity level or dietary  restrictions.   POSTHOSPITAL CARE PLANS:  The patient will go to [facility] for followup  rehab.   DISCHARGE MEDICATIONS:  Trazodone 50 mg 1-2 pills at bedtime.      Jasmine Pang, M.D.  Electronically Signed     BHS/MEDQ  D:  01/31/2009  T:  02/01/2009  Job:  [id number]

## 2010-12-18 NOTE — Procedures (Signed)
CEPHALIC VEIN MAPPING   INDICATION:  End-stage renal disease.   HISTORY:   EXAM:  The right cephalic vein is compressible.   Diameter measurements range from 0.47 to 0.43.   The left cephalic vein is occluded.   See attached worksheet for all measurements.   IMPRESSION:  Patent right cephalic vein which is of acceptable diameter  for use as a dialysis access site.   ___________________________________________  Quita Skye. Hart Rochester, M.D.   MG/MEDQ  D:  08/08/2009  T:  08/09/2009  Job:  [id number]

## 2010-12-18 NOTE — Consult Note (Signed)
[name], [name]               ACCOUNT NO.:  [id number]   MEDICAL RECORD NO.:  [id number]          PATIENT TYPE:  INP   LOCATION:  1511                         FACILITY:  [facility]   PHYSICIAN:  Antonietta Breach, M.D.  DATE OF BIRTH:  07/15/1956   DATE OF CONSULTATION:  04/27/2008  DATE OF DISCHARGE:                                 CONSULTATION   Mr. Malveaux states that he was using 3 bags of heroin per day and stopped  2 days ago.  He also was using an unknown amount of alcohol per day.  He  was admitted to the [facility] program for 1 day, then developed  coughing of greenish sputum and was admitted to the [facility]  on April 25, 2008.   Mr. Nest has been placed on the Ativan protocol.  His mood is normal.  His hope is intact.  His interests are normal.  He is not having any  thoughts of harming himself or others.  He has no delusions or  hallucinations.  He is cooperative with staff.  His orientation and  memory function are intact.   VITAL SIGNS:  Temperature 97.3, pulse 76, respiratory rate 18, blood  pressure is now 199/110.  He is not sweating.  His pupils are not dilated.  He is not having any goose flesh nor is he having any rhinorrhea.   Please see the undersigned's past dictation.   MENTAL STATUS EXAM:  Mr. Orlick is alert.  His eye contact is good.  Thought process logical, coherent, goal-directed.  No looseness of  associations.  Thought content:  No thoughts of harming himself.  No  thoughts of harming others.  No delusions, no hallucinations.  Affect is  mildly flat.  Mood within normal limits.  He is oriented to all spheres.  Memory intact to immediate recent and remote.  Speech within normal  limits.  Insight is intact.  Judgment is intact.   ASSESSMENT:  AXIS I:  Alcohol dependence.  Opioid dependence.  AXIS II:  Deferred.  AXIS III:  Respiratory distress, bronchitis versus influenza, chronic  obstructive pulmonary disease, history of chronic  kidney disease,  anemia.  AXIS IV:  Primary support group.  General medical.  AXIS V:  55.   Mr. Beaumier does agree to call emergency services immediately for any  thoughts of harming himself or others or distress.   RECOMMENDATIONS:  Concur with the Ativan withdrawal protocol.  Would  continue his multivitamin and thiamine indefinitely.   RECOMMENDATIONS:  If he does present with opioid withdrawal symptoms  such as pupillary dilation, rhinorrhea, diarrhea, would start the opioid  withdrawal protocol.   Once medically cleared, would readmit to [facility] and continue 12-step  method and groups.      Antonietta Breach, M.D.  Electronically Signed     JW/MEDQ  D:  04/27/2008  T:  04/27/2008  Job:  [id number]
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
## 2010-12-18 NOTE — Consult Note (Signed)
NAMELEMAN, MARTINEK               ACCOUNT NO.:  [id number]   MEDICAL RECORD NO.:  [id number]          PATIENT TYPE:  INP   LOCATION:  1503                         FACILITY:  [facility]   PHYSICIAN:  Antonietta Breach, M.D.  DATE OF BIRTH:  24-Aug-1955   DATE OF CONSULTATION:  06/01/2008  DATE OF DISCHARGE:                                 CONSULTATION   Please see the previous dictations.   Mr. Rekowski has had multiple exacerbations of medical problems regarding  his liver condition, hepatitis C and continuing to drink.   Most recently he was a patient at the [facility] chemical dependency  rehabilitation unit and was demonstrating alcohol withdrawal.  He was  then taken to the [facility] emergency facility and then  admitted to the [facility].   He has been on the Ativan withdrawal protocol.  His vital signs are  stable.  He is not demonstrating sweats and yet he does continue with  thought disorganization, severe agitation, combativeness, delusions,  memory dysfunction and disorientation.   He is afebrile.  Vital signs stable.  No sweats or tremors.  He has had  mildly elevated transaminases, slightly decreased albumin and a normal  INR.  He is afebrile.  Vital signs stable.   However, he is continuing to wander from his room to the elevators  threatening to leave the hospital against medical advice.   MENTAL STATUS EXAM:  Mr. Vivona has decreased attention span.  His eye  contact is intermittent only.  His concentration is decreased.  His  affect is anxious.  Mood is anxious.  He is not combative at the time of  the exam and is redirectable back to his room.  Upon orientation  questions he is oriented to person only.  Memory, he cannot remember the  past 5 minutes.  Speech is disorganized.  Thought process - rare  coherence, mostly disorganization.  Thought content - hallucinations,  illogia.  Insight is poor.  Judgment is impaired   DATA:  EKG QTC was above 460  milliseconds.   ASSESSMENT:  293.00, delirium not otherwise specified.  At this point  this does appear to be a multifactorial delirium.  It may be secondary  to decreased thiamine given that he does not currently have autonomic  instability in association with other delirium findings.  Therefore,  this does not appear to be a pure alcohol withdrawal delirium.   The current facility is not equipped to manage a memory impaired  psychotic patient.  He does require a locked ward.   He is at risk for passive lethal self-neglect outside of a hospital due  to his psychosis, memory impairment and impaired judgment.   RECOMMENDATIONS:  Would add Zyprexa 10 mg p.o. or IM b.i.d. p.r.n.  agitation not responding to Ativan and would order standing Zyprexa 10  mg p.o. or IM q.1400 hours.   Would admit to an inpatient psychiatric unit as soon as possible.   Please see the primary care attending physician's dictation and  laboratory report for further general medical condition.      Antonietta Breach,  M.D.  Electronically Signed     JW/MEDQ  D:  06/01/2008  T:  06/01/2008  Job:  [id number]

## 2010-12-18 NOTE — H&P (Signed)
[name], [name]               ACCOUNT NO.:  [id number]   MEDICAL RECORD NO.:  [id number]          PATIENT TYPE:  INP   LOCATION:  1233                         FACILITY:  [facility]   PHYSICIAN:  Vania Rea, M.D. DATE OF BIRTH:  1955/08/30   DATE OF ADMISSION:  05/31/2008  DATE OF DISCHARGE:                              HISTORY & PHYSICAL   PRIMARY CARE PHYSICIAN:  Unassigned.   CHIEF COMPLAINT:  Delirium tremens.   HISTORY OF PRESENT ILLNESS:  This is a 55 year old Caucasian gentleman  with a history of polysubstance abuse who was discharged from this  facility on October 1 after treatment for opiate withdrawal, acute  bronchitis, COPD, and was diagnosed with chronic kidney disease, to  follow up with Dr. Jeanice Lim on October 30th.  In the interim, the patient  was checked into [facility] for alcohol detox but  went into acute withdrawal, is said to be in delirium tremens, was  transferred to [facility], the [place] [facility]  service was called to assist in management of his withdrawal, and he has  been transferred to the step-down unit at [facility].  Currently, the patient is not fully oriented to place nor time.  He is  tremulous.  He is complaining of a headache and in fact, complaining of  pain all over and says he has the most symptomatology when asked;  however, he is not looking acutely distressed.  Because of his mental  status changes, his history and interview are not sufficiently reliable.   PAST MEDICAL HISTORY:  1. Polysubstance abuse.  2. COPD.  3. History of hypertension.  4. Chronic alcoholism.  5. History of hyperlipidemia.  6. History of hepatitis C.  7. History of chronic kidney disease.  8. Recently treated for bronchitis.   MEDICATIONS:  None.   ALLERGIES:  No known drug allergies.   SOCIAL HISTORY:  Other than noted above, unable to obtain.   FAMILY HISTORY:  Denies any family history of  addictions or coronary  artery disease, hypertension, or diabetes.   REVIEW OF SYSTEMS:  Other than noted above, complains of daily  headaches.   PHYSICAL EXAMINATION:  A middle-aged Caucasian gentleman  lying in the  bed in no acute distress.  VITALS:  Temperature 97.9, pulse 87, respirations 20, blood pressure  124/78.  He is saturating at 99% on room air.  Pupils are round and equal.  Mucous membranes are pink and anicteric.  He has no cervical lymphadenopathy, thyromegaly, or jugular venous  distention.  CHEST:  He has a few rhonchi at the bases.  No crackles.  CARDIOVASCULAR:  Regular rhythm.  No murmurs.  ABDOMEN:  Scaphoid, soft and nontender.  There are no masses.  No  ascites.  EXTREMITIES:  Without edema.  He has 2+ pulses bilaterally.  CENTRAL NERVOUS SYSTEM:  Cranial nerves II-XII are grossly intact.  He  has no focal neurological deficits.   LABS:  White count 1.8, hemoglobin 12.2, MCH 9.6, platelets 103, which  is down from 228 at this previous admission.  He has a normal  differential  on his white count.  His PT is 13.3, INR 1.  Liver function  tests are unremarkable, but his albumin is 2.8.  His serum chemistry is  remarkable only for a creatinine of 2.6 and a BUN of 26, otherwise  unremarkable.  Urine drug screen is positive for benzodiazepines and  tricyclics.   ASSESSMENT:  1. Delirium tremens.  2. History of hypertension.  Blood pressure currently controlled.  3. Leukopenia and thrombocytopenia, possibly related to alcoholism.  4. Chronic alcoholism with noted tobacco abuse.  5. History of hepatitis C.  6. History of hyperlipidemia.   PLAN:  Will put this gentleman on benzodiazepines and PPIs.  Will  withhold Lovenox because of his thrombocytopenia.  Will use TED  stockings.  Will cover him with B vitamins.  Will give IV fluids, check  his urine, check his lipid panel, and protect him while he goes through  the withdrawal process.  Other plans as per  orders.      Vania Rea, M.D.  Electronically Signed     LC/MEDQ  D:  05/31/2008  T:  05/31/2008  Job:  [id number]

## 2010-12-18 NOTE — Discharge Summary (Signed)
[name], [name]               ACCOUNT NO.:  [id number]   MEDICAL RECORD NO.:  [id number]          PATIENT TYPE:  INP   LOCATION:  1432                         FACILITY:  [facility]   PHYSICIAN:  Isidor Holts, M.D.  DATE OF BIRTH:  Jan 19, 1956   DATE OF ADMISSION:  01/28/2007  DATE OF DISCHARGE:                               DISCHARGE SUMMARY   PRIMARY CARE PHYSICIAN:  Unassigned.   DISCHARGE DIAGNOSES:  1. Heroin withdrawal.  2. Polysubstance abuse; i.e., alcohol, heroin, cocaine, amphetamines.  3. Dyslipidemia.  4. Hypertension.  5. Chronic kidney disease with nephrosis.  6. History of alcohol-related seizures.  7. Chronic obstructive pulmonary disease.  8. Smoking history.  9. Suicidal ideation.   DISCHARGE MEDICATIONS:  1. Thiamine 100 mg p.o. daily.  2. Protonix 40 mg p.o. daily.  3. Methadone 5 mg p.o. 4 times a day.  4. Combivent MDI 2 puffs 3 times a day.  5. Clonidine 0.2 mg p.o. 3 times a day.   Note:  This medication list may, of course, be modified or updated at  the appropriate time, by discharging physician.   PROCEDURES:  1. Abdominal and pelvic CT scan dated January 28, 2007.  This showed a      small focus of airspace disease in the right lung base, likely      atelectasis, early pneumonia, or aspiration cannot be totally      excluded.  Fatty liver was also noted.  There were no acute      findings in the pelvis.  2. Renal ultrasound scan dated January 29, 2007.  This showed hyperechoic      kidneys bilaterally secondary to chronic renal disease.  No      hydronephrosis.   CONSULTATIONS:  1. Antonietta Breach, M.D., psychiatrist.  2. Wilber Bihari. Caryn Section, M.D., nephrologist.   ADMISSION HISTORY:  As H&P notes of January 28, 2007, dictated by Elliot Cousin, M.D.  However in brief, this is a 55 year old male, with known  history of polysubstance abuse including heroin, cocaine, alcohol,  amphetamines, and tobacco; multiple behavioral health admissions and  assistance from [facility], past detoxification, prior history of alcohol-  related seizures, hypertension, dyslipidemia, chronic kidney disease  with baseline creatinine 1.7, COPD, previous right and left lower  extremity cellulitis, who presents with abdominal pain, shaking all  over, chills, and multiple loose stools.  On detailed history, it  appears the patient injects approximately 10  bags of heroin daily and  has not had any for over 36 hours because he was unable to obtain it.  He also drinks about 6 beers daily.  Abdominal CT scan done in the  emergency department was unrevealing for acute pathology.  The patient's  alcohol level was found to be significantly elevated at 246.  Urinalysis  showed significant proteinuria.  The patient was, therefore, admitted  for further evaluation, investigation, and management.   HOSPITAL COURSE:  #1.  HEROIN WITHDRAWAL:  For details of presentation, refer to admission  history above.  The patient clearly was in the throes of withdrawal  phenomena secondary to cessation of heroin intake.  He was, therefore,  managed with telemetry monitoring, scheduled Methadone, p.r.n. Ativan  and Clonidine. He received simple analgesics for abdominal pain.  Clinical response was somewhat tardy but steady, and by February 01, 2007,  the patient showed no further evidence of withdrawal.  He continues on  low-dose scheduled Methadone.  He was evaluated in the first  circumstance by Dr. Antonietta Breach on January 29, 2007, was considered to  have opiate/polysubstance dependence, was recommended to finish  detoxification protocol, and arrangements were put in motion to set  patient up with residential chemical dependency program. For details of  this consultation, refer to Dr. Providence Crosby consultation notes of January 29, 2007.   #2.  ALCOHOL ABUSE:  As mentioned above, the patient ingests about 6  beers per day, and his alcohol level at time of presentation was 246,   consistent with acute alcoholic intoxication.  Because of previous  history of alcohol-related seizures secondary to withdrawal, the patient  was managed with p.r.n. Ativan.  Subsequently when oral intake was  adequate, he was placed on a tapering course of Ativan.  He also  received vitamin supplementation with Multivitamins, Thiamine, and  Folate. As of February 03, 2007, i.e., the date of this dictation, evidence  of alcohol withdrawal was no longer present.   #3.  HYPERTENSION:  This was readily addressed with Clonidine which was  titrated to maintain normotension.  As of February 03, 2007, the patient's  blood pressure was entirely reasonable at 127/84.   #4.  DYSLIPIDEMIA:  The patient's lipid profile showed total cholesterol  191, triglycerides 95, HDL 65, LDL 107.  This is considered a reasonable  lipid profile.  No specific management, therefore, has been recommended.   #5.  CHRONIC OBSTRUCTIVE PULMONARY DISEASE:  The patient is a known  smoker.  He has been counseled appropriately for smoking cessation and  was managed with Nicoderm CQ patch during the course of his  hospitalization.  No evidence of COPD exacerbation was noted during  course of patient's hospitalization.  However, he has been placed on  Combivent MDI.  At the time of this dictation, respiratory status was  stable.   #6.  CHRONIC KIDNEY DISEASE, ACUTE RENAL FAILURE, AND NEPHROSIS:  The  patient is known to have chronic kidney disease. Baseline creatinine was  estimated to be 1.7.  At the time of presentation, his BUN was  appreciably elevated at 32 with a creatinine of 2.67.  He was managed  with intravenous fluid hydration, and we are pleased to note that as of  February 03, 2007, his BUN was 17, creatinine 1.66.  In addition, the patient  was found on urinalysis in the emergency department to have a urine  protein of over 300.  He underwent 24-hour urinary collection which  showed urine protein of 7.767 grams; i.e.,  nephrotic range proteinuria.  Serum albumin was somewhat low at 3.2, raising the specter of nephrotic  syndrome, likely in this case, substance induced.  Renal ultrasound scan  showed no evidence of hydronephrosis.  Nephrology consultation was  called which was kindly provided by Dr. Marina Gravel.  For full details  of this consultation, refer to consultation notes of February 02, 2007.  He  has recommended that renal biopsy is not indicated in this patient, and  he also opined that, based on patient's prior behavior, the patient  would be unlikely to show reliability should he be treated with specific  therapy; i.e., steroids, Cytoxan, etc., for glomerular  disease.  He has,  therefore, recommended serology tests; i.e., HIV, hepatitis serology,  ANA, and urinalysis.  At the time of this dictation, urinalysis has been  obtained and is negative. Other serologies are still pending.   #7.  SUICIDAL IDEATION:  The patient, on February 02, 2007, according to  nursing staff, exhibited bizarre behavior with suicidal ideation,  wrapping bed clothes around his neck, cut phone line, etc.  This  necessitated requesting an urgent psychiatric consultation, which was  provided once again by Dr. Antonietta Breach.  For details of this  consultation, refer to consultation notes of February 02, 2007.  Recommendations were made for one-on-one sitter, suicide precautions.  He, however, deferred additional psychotropics, opined that patient may  actually, indeed, have major depressive disorder in addition to  polysubstance dependence and has recommended inpatient behavioral health  management.  At the time of this dictation, the patient is pending  transfer to a behavioral health facility.   DISPOSITION:  This will be elucidated in detail at the appropriate time,  by discharging physician.  However, it is anticipated that within the  next few days the patient will be discharged to appropriate facility for  inpatient  psychiatric management.      Isidor Holts, M.D.  Electronically Signed     CO/MEDQ  D:  02/03/2007  T:  02/03/2007  Job:  [id number]

## 2010-12-18 NOTE — Assessment & Plan Note (Signed)
OFFICE VISIT   KAZUO, DURNIL  DOB:  05-Sep-1955                                       08/08/2009  EAVWU#:[id number]   Mr. Goodlin was referred today for further evaluation for vascular access  for a poorly maturing AV fistula in the left upper arm.  This fistula  was created by Dr. Darrick Penna in July 2002 and had in March 06, 2009 was  patent with palpable thrill.  December 16th, he had a fistulogram  performed, which I have reviewed today, and this reveals severe stenosis  over about a 5-cm length beginning at the brachial arterial anastomosis.  The vein was borderline in size but was patent otherwise.  Today, this  white gentleman is evaluated for further vascular access.   PAST MEDICAL HISTORY:  Chronic stable problems include:  1. End-stage renal disease hemodialysis.  2. COPD.  3. Hypertension not currently on medication.  4. History of gout.  5. Negative for stroke, diabetes, or coronary artery disease.   FAMILY HISTORY:  Positive for hypertension in both parents and a  brother.  Negative coronary artery disease, diabetes, and stroke.   SOCIAL HISTORY:  He is single, smokes a half-pack of cigarettes per day  (he is trying to quit), and does not use alcohol.   REVIEW OF SYSTEMS:  Positive for weight loss, dyspnea on exertion,  bronchitis, kidney disease, pain is his legs with walking, headaches,  joint pain, muscle.  All other systems negative.   PHYSICAL EXAM:  Blood pressure 113/70, heart rate 80, respirations 14,  temperature 98.  GENERAL:  He is a well-developed thin male who is in no apparent  distress.  Alert and oriented x3.  HEENT:  Exam reveals he is edentulous.  EOMs are intact.  CHEST:  Clear to auscultation.  CARDIOVASCULAR:  Regular rhythm, no murmurs.  ABDOMEN:  Soft, nontender with no masses.  MUSCULOSKELETAL:  Exam reveals no major deformities.  NEUROLOGIC:  Normal.  He has 3+ femoral and posterior tibial pulses  bilaterally.  Upper extremity exam on the left reveals no pulsatile or thrill in the  upper arm fistula with no audible flow by Doppler.  The right upper  extremity appears to have an excellent to cephalic vein in the forearm  with good 3+ brachial and radial pulse.   I ordered a vein mapping of his right upper extremity as well as a  fistulogram on the left today in the vascular lab and reviewed and  interpreted these.  The left upper arm fistula is occluded with a very  small cephalic vein in the upper arm.  The right forearm cephalic vein  is widely patent.  I think the best plan would be creation of a right  forearm AV fistula (Cimino) and I will schedule that for Thursday of  this week, January 6th.  Hopefully, this will provide satisfactory site  for vascular access for this nice man.     [name], M.D.  Electronically Signed   JDL/MEDQ  D:  08/08/2009  T:  08/09/2009  Job:  [id number]

## 2010-12-18 NOTE — Consult Note (Signed)
NAMEDYSHON, PHILBIN NO.:  [id number]   MEDICAL RECORD NO.:  [id number]          PATIENT TYPE:  INP   LOCATION:  2024                         FACILITY:  [facility]   PHYSICIAN:  Antonietta Breach, M.D.  DATE OF BIRTH:  [birth year]   DATE OF CONSULTATION:  01/26/2009  DATE OF DISCHARGE:                                 CONSULTATION   REQUESTING PHYSICIAN:  [facility].   REASON FOR CONSULTATION:  Anxiety, substance withdrawal.   HISTORY OF PRESENT ILLNESS:  Mr. [name] is a 55 year old male  admitted to the [facility] on January 24, 2009 due to withdrawal from  multiple substances.   Mr. [name] does have a baseline of excessive worry as well as feeling on  edge and muscle tension.   He also has relapsed on a number of substances.  He is evidently having  difficulty with memory because he states that for at least 4 weeks, he  has been using 2 bags of heroin per day.  However he has had  interruption at least when  he was admitted to the [facility] on June 12 and stayed there until at least until June 16  according to the graphic for vital signs in E chart.   He also was seen at [facility] from June 18 through June 21 then was  sent to [facility] on June 22.   He estimates that he has been using 10 mg of Klonopin, 8 mg of Xanax 8  mg of Ativan and 100 mg of Valium per day, by his history.   He denies depressed mood.  He does have constructive interests and  future goals.  His orientation is completely intact except he misses the  day of the week by one.  He does take a significant amount of time to  answer orientation questions.  For the rest of the mental status exam,  please see below.   He is cooperative with bedside care and nonviolent.   He is displaying diffuse tremor.  He also has had some profuse sweating  today.  Regarding withdrawal signs, his temperature has been normal.  His pulse has been normal.  His blood  pressure has begun to spike while  on the Atacand withdrawal protocol which was started at [facility] on  June 22.  Yesterday it was reading 194/94, and today it is peaking at  201/98 at the time of consultation.   The general medical attending is ordering morphine. At the time that the  blood pressure is 201/98, his temperature is 96.9, pulse is 70,  respiratory rate 18.   He is not having any hallucinations or delusions, although the staff did  report some bugs on the ceiling last night as visual hallucinations.   PAST PSYCHIATRIC HISTORY:  Mr. [name] does have a significant past  psychiatric history.  He was most recently admitted to the [facility] in June 2010 the 12th of this month.  At that  time it was noted that he had stopped drinking alcohol on June 9. He  presented tremulous with nausea and vomiting and was having  hallucinations.  He also smelled of alcohol at that time.  His alcohol  level on presentation was 171.  He was admitted as described above.   He was started on the clonidine withdrawal protocol as well as the  Librium protocol.   Mr. [name] has undergone admissions to [name] as well.   In November 2009 he displayed delirium in the form of thought  disorganization and poor attention as well as disorientation.  He  recovered after a period of Zyprexa.   FAMILY PSYCHIATRIC HISTORY:  None known.   SOCIAL HISTORY:  Mr. [name] is divorced.  He has substance use as above.  He is medically disabled and has been living on the streets.   PAST MEDICAL HISTORY:  Chronic renal insufficiency, hepatitis C.  There  is a report of myocardial infarction.   ALLERGIES:  No known drug allergies.   MEDICATIONS:  MAR is reviewed.  He was started on the Ativan withdrawal  protocol on June 22. The undersigned has restarted it today. He also is  receiving clonidine 0.1 mg t.i.d. increased to 0.2 mg t.i.d. yesterday.   His troponins over the last  24 hours have been within normal limits.   TSH normal.   Urine drug screen was positive for benzodiazepines on June 22, SGOT 34,  SGPT 33, alcohol was 61 on June 22.  HIV on 19 June nonreactive.  Hepatitis B surface antigen negative.  On June 17 he presented with  alcohol level of 83.   Head CT without contrast on June 17 after he fell from a ladder showed  no acute intracranial abnormality.   REVIEW OF SYSTEMS:  CONSTITUTIONAL:  Head, eyes, ears, nose and throat,  mouth neurologic, psychiatric cardiovascular, respiratory,  gastrointestinal, genitourinary, skin, musculoskeletal, hematologic  lymphatic, endocrine metabolic all unremarkable.   PHYSICAL EXAMINATION:  VITAL SIGNS:  Please see the history of present  illness.  GENERAL APPEARANCE:  Mr. [name] is a middle-aged male lying in a supine  position in his hospital bed.  He does have a high frequency tremor on  bilateral hand extension.  He also has tremulous speech.   MENTAL STATUS EXAM:  Mr. [name] is alert.  His eye contact is good.  His  attention span is mildly decreased.  Concentration mildly decreased.  Affect is anxious.  Mood is anxious.  He is oriented to all spheres  except missing the day of the week by 1 day.  Memory 3/3 visual objects  immediate and 3/3 on recall.   Fund of knowledge and intelligence are grossly within normal limits.  Speech involves tremulousness but no dysarthria.  Thought process is  logical, coherent, goal-directed.  No looseness of associations.  He  does pause for answering the orientation parameters.  Thought content -  no thoughts of harming himself or others; no hallucinations or  delusions.  Insight is intact;  judgment is intact.   ASSESSMENT:  AXIS I:  293.00 delirium not otherwise specified.  He did  have some visual hallucinations last night.  However he has not met the  full criteria for delirium during this hospitalization. Clearly his  course, however is waxing and waning  regarding his mental status.  Sedative hypnotic dependence, alcohol dependence.  Opioid dependence.  He appears to be having a withdrawal syndrome that is likely secondary  to sedative hypnotics and alcohol.  He is having likely a small amount  of opioid withdrawal.  293.84 anxiety disorder not otherwise specified.  AXIS II:  Deferred.  AXIS III:  See past medical history.  AXIS IV:  General medical economic primary support group.  AXIS V:  40.   Mr. [name] agrees to call emergency services for any emergency  psychiatric symptoms.   The undersigned provided ego supportive psychotherapy and education.   The patient and the undersigned discussed the withdrawal protocol.  He  wants to proceed.   RECOMMENDATIONS:  1. Concur with utilizing clonidine to cover the sympathetic opioid      withdrawal symptoms.  The undersigned has restarted the Ativan withdrawal protocol due to the  combination of blood pressure elevation with tremors and  objectively observed sweating.  He may have a baseline of essential  hypertension.  However, the emergence of blood pressure spikes despite  clonidine 0.2 t.i.d. along with the sweating and tremors does support  that he is breaking through his Ativan withdrawal protocol and the  patient may require a change to phenobarbital with a loading dose.  1. Would continue thiamine 100 mg daily.  2. Would continue q.2 h. Vital sign checks, CIWAs and use of the      Ativan p.r.n., keeping in mind switching to the phenobarbital      protocol if necessary.  Given his history of hepatitis, the Ativan      protocol is optimal as long as his withdrawal can be controlled      (versus phenobarbital).  3. Continue low stimulation ego support with memory and orientation      cues in the room and a quiet environment.   DISCUSSION:  Mr. [name] is showing a return of intact memory function.  However, it is clear that his ability to provide history from recent  weeks is  impaired.   Would monitor for any slurred speech or ataxia regarding his Ativan  dosing.      Antonietta Breach, M.D.  Electronically Signed     JW/MEDQ  D:  01/26/2009  T:  01/26/2009  Job:  [id number]

## 2010-12-18 NOTE — Discharge Summary (Signed)
NAMEROBEY, MASSMANN               ACCOUNT NO.:  [id number]   MEDICAL RECORD NO.:  [id number]          PATIENT TYPE:  IPS   LOCATION:  0307                          FACILITY:  [facility]   PHYSICIAN:  Jasmine Pang, M.D. DATE OF BIRTH:  [birth year]   DATE OF ADMISSION:  02/04/2007  DATE OF DISCHARGE:  02/10/2007                               DISCHARGE SUMMARY   IDENTIFICATION:  This is a 55 year old married white male who was  admitted on a voluntary basis on 02/04/2007   HISTORY OF PRESENT ILLNESS:  The patient presents with a history of  opiate abuse.  He has been using heroin and Dilaudid for the past  several months.  He is also been drinking up to a case of beer a day.  His last drink and drug use were approximately 1 week ago.  The patient  was transferred from [facility] where he spent several days  for withdrawal symptoms.  There was no diarrhea and his lab work was  normal.  The patient also appeared to be having some periodic episodes  of delirium.  He denied any other substance use.  He states he is here  to get himself into a long-term rehab program and continue his detox.  His intentions are to go to [facility] for long-term rehabilitation.  He  was here back in 2006 for similar symptoms.  His longest sobriety period  has been 6-8 months.  He is currently sponsored by the [facility].  He lives with his wife and has an adult daughter.  There were no reports of psychiatric problems in his family that he was  aware of.  He has had a history of IV drug use.  He reports a history of  seizure disorder that is related to alcohol use.  His last seizure was 6  months ago.  He also has a history of hypertension.  He was started on  methadone 5 mg p.o. t.i.d. at [facility].  He was discharged  from there on Protonix 40 mg daily, Combivent MDI 2 puffs inhaled three  times daily, clonidine 0.2 mg t.i.d. and thiamine 100 mg p.o. q. day.  He  has no known drug allergies.   PHYSICAL FINDINGS:  The patient was fully assessed at the [facility].  He was a thin, tall male in no acute distress.   LABORATORY DATA:  Antinuclear antibody was negative.  His creatinine was  1.61.  Phosphorus of 4.4.  HIV nonreactive.  Albumin was low at 1.8,  hepatitis C antibody was positive.  CBC revealed hemoglobin 11.1 and  hematocrit of 31.6.   HOSPITAL COURSE:  Upon admission, the patient was placed on thiamine 100  mg p.o. daily, Protonix 40 mg p.o. daily, methadone 5 mg p.o. four times  daily, Combivent MDI 2 puffs inhaled three times daily.  He was also  placed on the clonidine detox protocol and methadone was stopped.  In  addition he was placed on Zyprexa Zydis 5 mg p.o. q.6 h p.r.n. agitation  or anxiety.  On 02/08/2007  he was given Robaxin 1000 mg p.o. q.6 h  p.r.n. muscle cramps.  The patient tolerated the medications well with  no significant side effects.  He was appropriate on the unit and able to  participate fully in unit therapeutic groups and activities.  He was  initially disheveled but friendly and cooperative.  He was tearful.  He  wanted off his methadone.  He also wanted long-term treatment at  [facility].  He reported he was very motivated to stop drug use.  The  methadone was discontinued and clonidine detox protocol was started.  As  hospitalization progressed the patient was better able to participate in  unit therapeutic groups and activities.  His mood was improving though  he was still anxious.  He a had no suicidal or homicidal ideation.  Sleep was improving and appetite was improving.  On 02/09/2007 the  patient was somewhat depressed and anxious about where he will go when  he leaves here.  He wants to go to a long-term treatment center but  began to have questions about whether he wanted to do this immediately.  He completed a clonidine detox protocol and did well.  On 02/10/2007  mental status had  improved markedly from admission status.  The patient  was friendly cooperative with good eye contact.  Speech was normal rate  and flow.  Psychomotor activity was within normal limits.  The patient's  mood was euthymic.  Affect wide range.  No suicidal or homicidal  ideation.  No thoughts of self injurious behavior.  No auditory or  visual hallucinations.  No paranoia or delusions.  Thoughts were logical  and goal-directed.  Thought content no predominant theme.  Cognitive was  grossly back to baseline.   DISCHARGE DIAGNOSES:  AXIS I:  1. Polysubstance dependence  2. Depressive disorder not otherwise specified.  AXIS II: None  AXIS III: Hypertension, seizure disorder.  AXIS IV: Severe (medical problems with other psychosocial problems  related to chronic substance abuse.)  AXIS V: GAF upon discharge was 55.  GAF upon admission was 45-50.  GAF  highest past year was 65.   DISCHARGE PLANS:  There were no specific activity level or dietary  restrictions.   POST HOSPITAL CARE PLANS:  The patient will initially be seen at the  [facility] for psychiatric and recovery counseling on July 9 at 9  o'clock a.m..  He is also going to continue to try to get into a long-  term treatment program and called [facility].  He has been instructed how  to access this program.   DISCHARGE MEDICATIONS:  Ambien 10 mg at bedtime p.r.n. insomnia.      Jasmine Pang, M.D.  Electronically Signed     BHS/MEDQ  D:  02/10/2007  T:  02/10/2007  Job:  [id number]

## 2010-12-18 NOTE — Consult Note (Signed)
NAMESANTONIO, SPEAKMAN               ACCOUNT NO.:  [id number]   MEDICAL RECORD NO.:  [id number]          PATIENT TYPE:  INP   LOCATION:  1403                         FACILITY:  [facility]   PHYSICIAN:  Antonietta Breach, M.D.  DATE OF BIRTH:  November 01, 1955   DATE OF CONSULTATION:  02/23/2007  DATE OF DISCHARGE:                                 CONSULTATION   INPATIENT CONSULTATION FOLLOW-UP:  Mr. Schlageter has relapsed approximately 2 weeks ago after his discharge  from [facility].  He has been using, he states, 10 bags of heroin  per day along with drinking about a half-gallon of hard liquor per day.   The patient is not having any suicidal thoughts.  He has no thoughts of  harming others.  He has no delusions or hallucinations.  He does have a  slight withdrawal breakthrough tremor.  Not having any other withdrawal  symptoms currently on the Ativan alcohol withdrawal protocol.  He is on  maintenance methadone for his opioid dependence at 20 mg daily.   When the patient was discharged from [facility] on July 8,  he was discharged on only one psychotropic.  That was Ambien 10 mg  q.h.s. p.r.n.  He had an intake appointment at the [facility] for  outpatient chemical dependency rehabilitation.   PHYSICAL EXAMINATION:  Slight hand tremor on extension.  Temperature 97.1, pulse 57, respiratory rate 20, blood pressure 148/94.  O2 saturation on room air 95%.  WBC 1.8, hemoglobin 12.1, platelet count 223.  Hepatitis C positive.  TSH low at 0.31.  SGOT 32, SGPT 23.  Mental status exam:  Mr. Trostel is alert.  He is oriented to all spheres.  His memory is intact to immediate, recent and remote.  His speech is  within normal limits.  Thought process logical, coherent, goal-directed,  no looseness of associations.  Thought content:  No thoughts of harming  himself, no thoughts of harming others, no delusions, no hallucinations.  Affect is slightly anxious.  Judgment is intact.  Insight is  intact.   ASSESSMENT:  AXIS I:  Polysubstance dependence; history of depressive  disorder, not otherwise specified; anxiety disorder, not otherwise  specified.   RECOMMENDATIONS:  1. Would continue the Ativan withdrawal protocol.  2. Would ask the social worker to set this patient up with the new      [facility] inpatient chemical dependency rehabilitation program      [place].  Information on this can be obtained at [phone].  This      is a program for patients who are without insurance.  Twelve-step      groups and principles recommended.  Continue his thiamine,      multivitamin and folic acid daily.  3. Methadone maintenance for opioid dependence is done by one of the      clinics in town.  The social worker can obtain more information on      setting this patient up with his follow-up after his inpatient CD      rehab.     Antonietta Breach, M.D.  Electronically Signed  JW/MEDQ  D:  02/23/2007  T:  02/23/2007  Job:  [id number]

## 2010-12-19 IMAGING — CT CT ABD-PELV W/ CM
1 of 5 series · 14 of 36 positions shown, 19 images · IV contrast (agent unspecified)
Comparison: CT scan dated 01/19/2009

CLINICAL DATA: Epigastric pain and flank pain.  Elevated amylase
and lipase.

CT ABDOMEN AND PELVIS WITH CONTRAST
TECHNIQUE: Multidetector CT imaging of the abdomen and pelvis was
performed following the standard protocol during bolus
administration of intravenous contrast.
Contrast: 80 ml Gmnipaque-4RR

[Series 2: routine abdomen · axial · 0.82mm/px · z∈[-506,-116]mm · 14 of 88 slices shown, 19 images]
[im 5/88  soft-tissue]
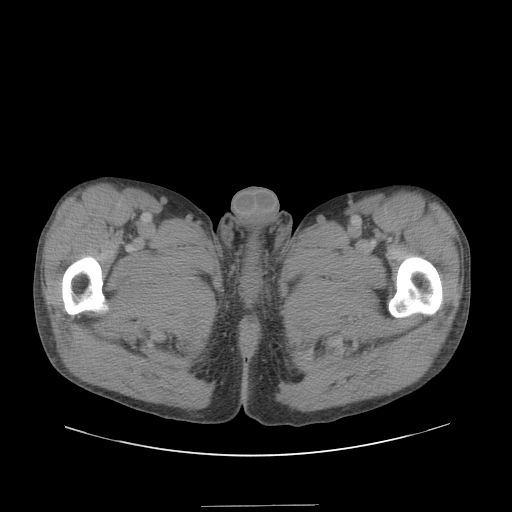
[im 5/88  bone]
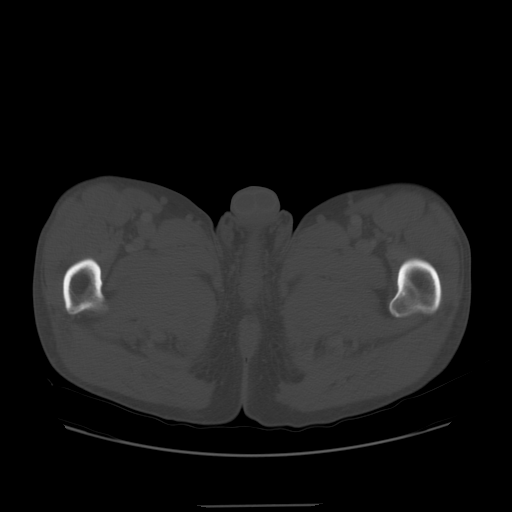
[im 14/88  soft-tissue]
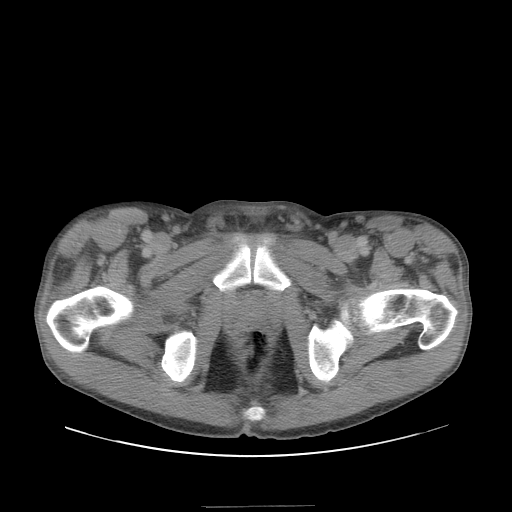
[im 18/88  soft-tissue]
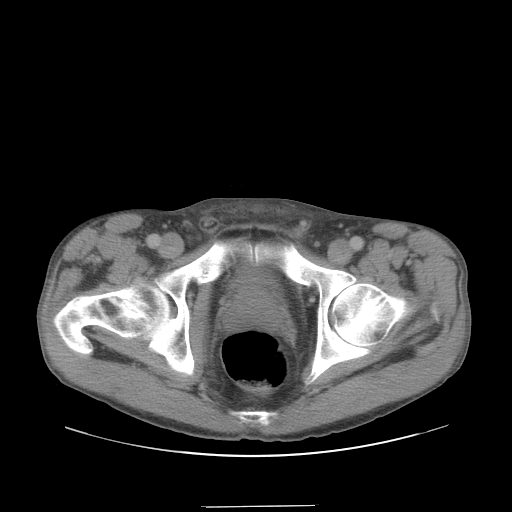
[im 27/88  soft-tissue]
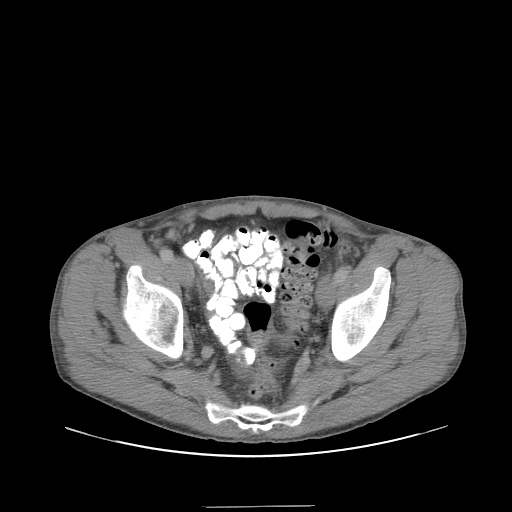
[im 31/88  soft-tissue]
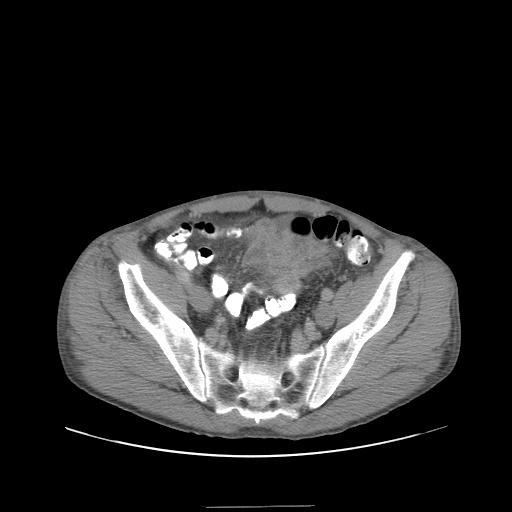
[im 40/88  soft-tissue]
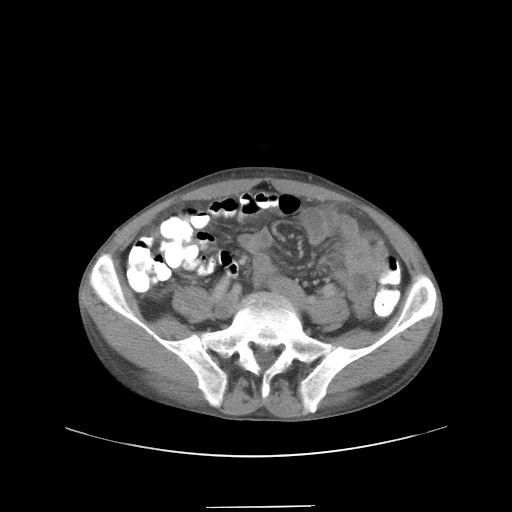
[im 44/88  soft-tissue]
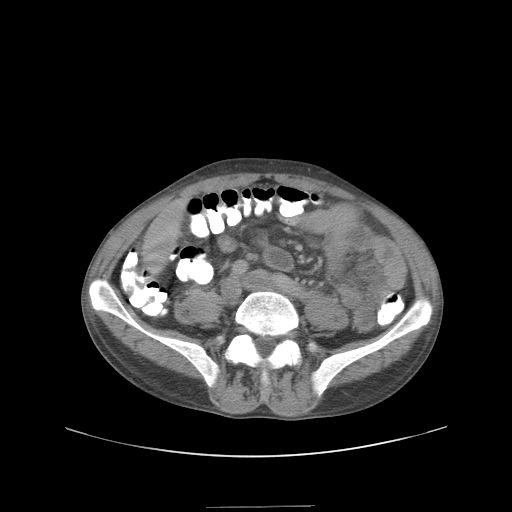
[im 48/88  soft-tissue]
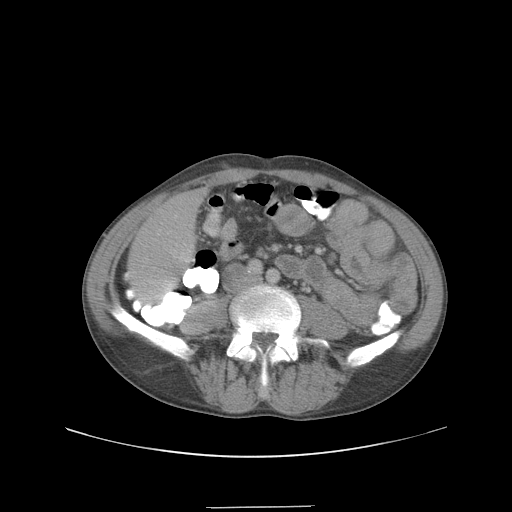
[im 57/88  soft-tissue]
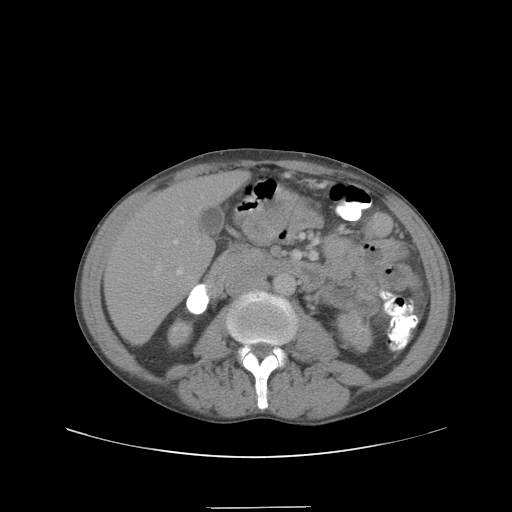
[im 57/88  bone]
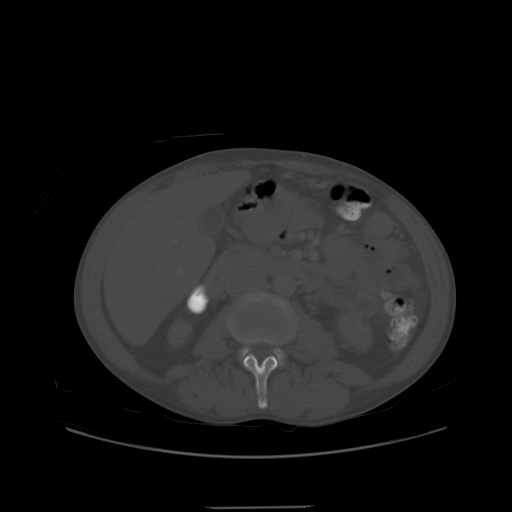
[im 61/88  soft-tissue]
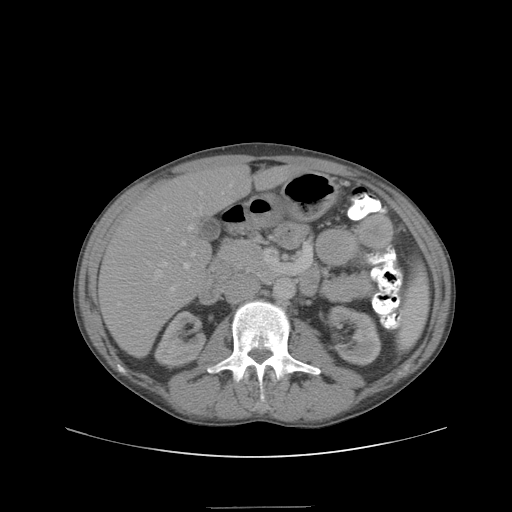
[im 70/88  soft-tissue]
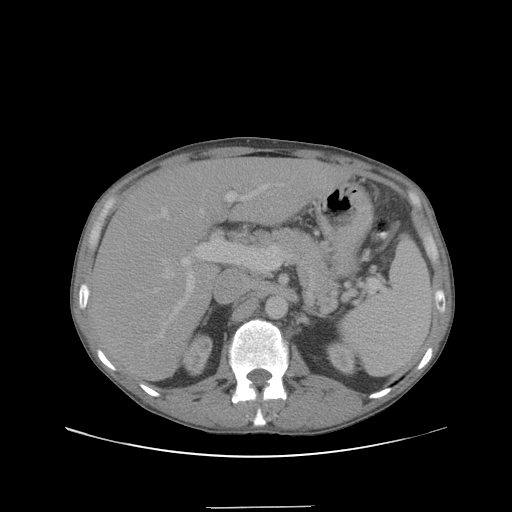
[im 70/88  lung]
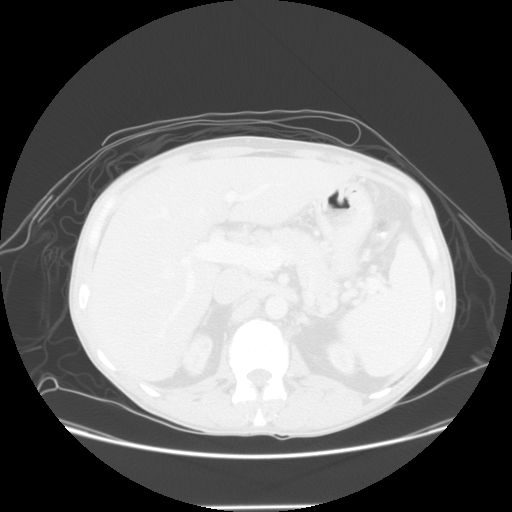
[im 74/88  soft-tissue]
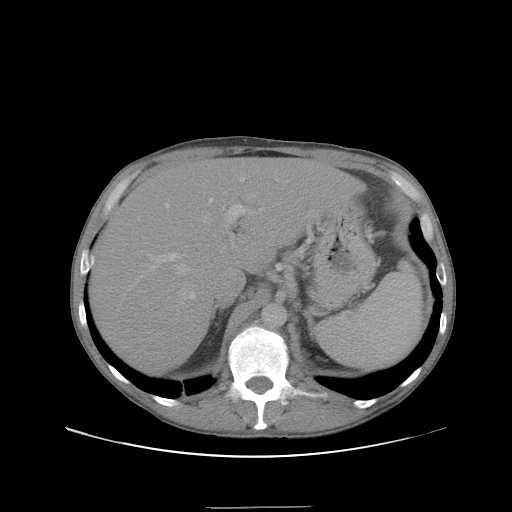
[im 74/88  lung]
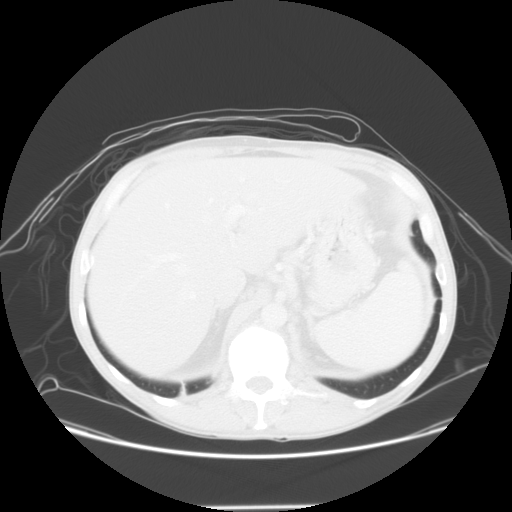
[im 79/88  lung]
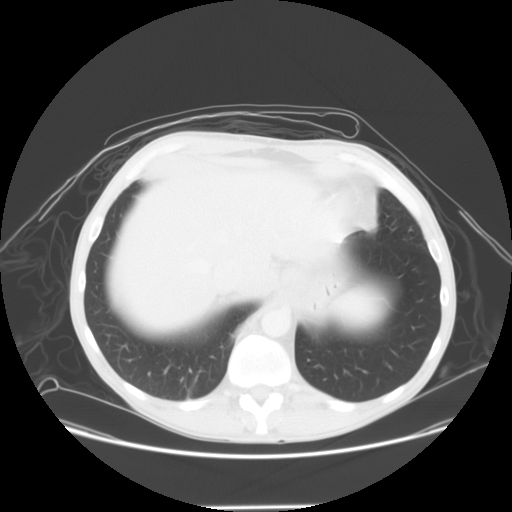
[im 83/88  soft-tissue]
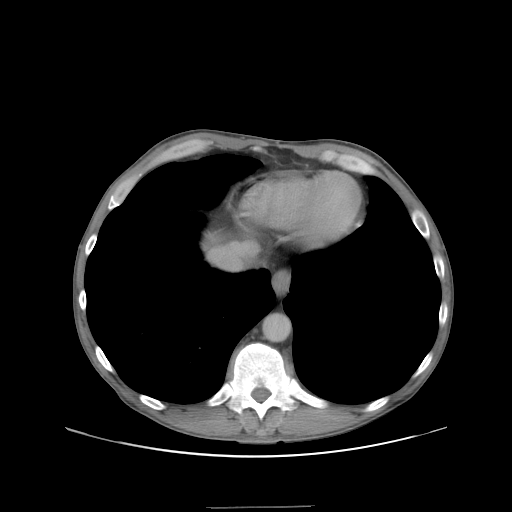
[im 83/88  lung]
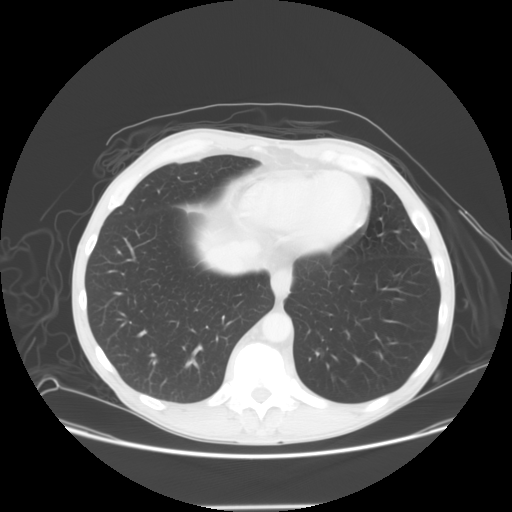

[14 of 36 positions shown; findings below may reference images not displayed]

FINDINGS: There is mild  hepatomegaly.  Portal  vein is patent.
The spleen, pancreas, and adrenal glands are normal.  There is
bilateral renal atrophy which has developed since the prior study.

The bowel is normal including the terminal ileum and appendix.
There is a tiny amount of free fluid in the pelvis.

There is no evidence of peripancreatic inflammation or pseudocyst.
IMPRESSION: Mild hepatomegaly.  New bilateral  renal atrophy.  Otherwise benign-
appearing abdomen and pelvis.
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
## 2010-12-21 NOTE — H&P (Signed)
[name], AGUILAR NO.:  [id number]   MEDICAL RECORD NO.:  [id number]          PATIENT TYPE:  EMS   LOCATION:  ED                           FACILITY:  [facility]   PHYSICIAN:  Marcellus Scott, MD     DATE OF BIRTH:  1956-06-04   DATE OF ADMISSION:  11/19/2006  DATE OF DISCHARGE:                              HISTORY & PHYSICAL   PRIMARY MEDICAL DOCTOR:  Patient is unassigned to the [facility]  System.   CHIEF COMPLAINT:  Altered mental status.   HISTORY OF PRESENT ILLNESS:  Patient at this time is sedated and unable  to provide any history.  He has received 2 mg of Ativan IV push and 20  mg of IM Geodon for his agitation.  The history is obtained from the  emergency room physician's note.   Mr. Norwood is a 55 year old Caucasian male patient who presents with a  history of altered mental status.  The history was provided to the ER  physician by the patient and EMS.  The patient was brought in by the  EMS.  Apparently, the patient complained that he was withdrawing from  alcohol, narcotics, and cocaine.  He was noted to be very agitated,  tremulous, and was trying to get up and leave.  This was said to have  started a short while prior to the ER visit.   On reviewing the computer, the patient was recently discharged on April  7th by our service.   PAST MEDICAL HISTORY:  1. Atypical chest pain, thought to be a musculoskeletal type of chest      pain.  2. Chronic kidney disease with baseline creatinine of 1.7.  3. Hypertension.  4. Hyperlipidemia.  5. Alcoholism.  6. History of peripheral vascular disease.   ALLERGIES:  No known drug allergies.   MEDICATIONS:  Per the discharge summary:  1. Aspirin 325 mg p.o. daily.  2. Metoprolol 25 mg p.o. twice daily.  3. Zocor 20 mg daily.  4. Thiamine 100 mg p.o. daily.  5. Folate 1 mg p.o. daily.  6. Clonidine 0.1 mg p.o. twice daily.  7. Ativan 1 mg up to 3 times daily p.r.n.   FAMILY HISTORY:  Obtained  from the prior history and physical on April  7th.  Is significant for coronary artery disease, hypertension,  peripheral vascular disease.   SOCIAL HISTORY:  Again, obtained from the prior history and physical  note.  Patient resides with his wife, Lurena Joiner, whose phone number is [phone].  He works as a Nutritional therapist.  He smokes approximately one pack of  cigarettes per day for 34 years.  He binge drinks on the weekends a 12-  pack on the weekends, and he is said not to use drugs, which obviously  is not the case.   REVIEW OF SYSTEMS:  Not obtainable.   PHYSICAL EXAMINATION:  GENERAL:  Mr. Lipsky is a moderately built and  nourished male who is in no obvious distress at this time.  VITAL SIGNS:  Temperature 97.8, blood pressure 125/50, pulse 81 per  minute, respirations 18  per minute, saturating at 95%.  HEENT:  Normocephalic and atraumatic.  Pupils are equal, round and  reactive to light and accommodation.  Oral cavity is difficult to  examine with lack of patient cooperation but appears edentulous.  NECK:  Without JVD, carotid bruits, lymphadenopathy, or goiter.  It is  supple.  RESPIRATORY:  Poor patient effort.  Seems clear to auscultation  bilaterally.  CARDIOVASCULAR:  First and second heart sounds are heard.  No third or  fourth heart sounds.  No murmurs, rubs, gallops, or clicks.  ABDOMEN:  Nondistended, nontender.  No organomegaly or mass.  Bowel  sounds are heard normally.  CENTRAL NERVOUS SYSTEM:  Patient is sedated.  To touch, will mumble  incomprehensibly and localize with both upper extremities but no eye  opening.  He also moves his lower extremities symmetrically.  He has no  focal neurological deficits.  EXTREMITIES:  With some scratch marks on his right upper extremity.  There is no clubbing, cyanosis or edema.  Peripheral pulses are  symmetrically felt.  SKIN:  Without any rashes.   LAB DATA:  CBC is essentially unremarkable.  Basic metabolic panel,  which is  again unremarkable with a BUN of 20 and creatinine of 1.01.  Hepatic panel is remarkable for an AST of 63, ALT 26, albumin 2.5,  calcium 8.3.  TotalCK of 1788, CK-MB 7.5, relative index of 0.4.  Troponin less than 0.05.  Urine toxicology screen is positive for  benzodiazepines and cocaine.  Blood alcohol level of 490 mg/dl.  Urinalysis with greater than 300 mg/dl of protein, a moderate amount of  blood and many bacteria but negative for nitrite or leukocyte.   CT of the head without contrast.  Impression is negative noncontrast  head CT.   Chest x-ray, no active cardiopulmonary disease.   EKG with normal sinus rhythm.   ASSESSMENT/PLAN:  1. Altered mental status secondary to the alcohol intoxication,      polysubstance abuse/withdrawal:  Will admit patient to a telemetry      bed.  Will place the patient nil per oral.  Will monitor patient.  2. Alcohol intoxication/withdrawal:  Will place the patient n.p.o.      Will monitor closely for delirium tremens and seizures. Will place      the patient on multivitamin supplements intravenously and Ativan      withdrawal protocol.  3. Cocaine abuse:  Patient is status post Geodon.  Holdthe patient's      beta blockers at this time.  Will obtain substance abuse      counseling. Continue supportive care. Monitor for withdrawal and      ativan prn for agitation.  4. Narcotic abuse:  Patient is hemodynamically stable at this time.      Continue Clonidine. Cosider low dose methadone for withdrawal      features.  5. Transaminitis secondary to problem #2:  We will follow the      patient's complete metabolic panel.  6. Rhabdomyolysis secondary to the substance abuse and associated      agitation and tremulousness:  IV hydrate patient and follow the      patient closely and also monitor patient's muscle enzymes on a      daily basis.  7. Tobacco Abuse - councilling 8. ? Benzodiazepine Abuse - montior for seizures and prn ativan.  To elicit  further details from the patient when the patient is awake and  able to provide further details.      Theadora Rama  Waymon Amato, MD  Electronically Signed     AH/MEDQ  D:  11/19/2006  T:  11/20/2006  Job:  [id number]

## 2010-12-21 NOTE — Discharge Summary (Signed)
NAMEJAXDEN, [name]               ACCOUNT NO.:  [id number]   MEDICAL RECORD NO.:  [id number]          PATIENT TYPE:  INP   LOCATION:  1610                         [facility]   PHYSICIAN:  Theone Stanley, MD   DATE OF BIRTH:  04/03/1956   DATE OF ADMISSION:  11/11/2004  DATE OF DISCHARGE:  11/17/2004                                 DISCHARGE SUMMARY   Please note:  The patient left AMA.   ADMISSION DIAGNOSIS:  Alcohol abuse and polysubstance abuse, admission for  detoxification.   DISCHARGE DIAGNOSIS:  Alcohol abuse and polysubstance abuse, admission for  detoxification.   CONSULTATIONS:  Antonietta Breach, M.D.   PROCEDURES/DIAGNOSTIC TESTS:  The patient had a CT of the abdomen which did  not show any evidence of trauma.   HOSPITAL COURSE:  [name] is a 55 year old Caucasian gentleman with  polysubstance abuse and extensive alcohol use, drinking about 1/2 gallon of  alcohol on a daily basis.  His father had petitioned him to come to the  hospital for detox and because of his severe nature of his alcohol  consumption, extensive Ativan was used.  In addition, he was given  multivitamins, thiamine for prevention of Wernicke's encephalopathy.  The  patient was successfully detoxed.  He was switched over to p.o. Ativan.  However, on November 17, 2004, the patient wanted to go.  We had convinced him  to stay until we could figure out whether he would go to behavioral health.  By the afternoon, however, it was noted that the patient had left.  Security  was called.  We could not find him.  He had left AMA.  At that point in  time, he was stable and we did not have further petition to keep him in the  hospital since 72 hours had already passed.   DISCHARGE MEDICATIONS:  None.   FOLLOWUP:  The patient was supposed to follow up with outpatient  polysubstance abuse therapy; however, we were not able to give him this  information.      AEJ/MEDQ  D:  11/18/2004  T:  11/18/2004   Job:  [id number]
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
## 2010-12-21 NOTE — H&P (Signed)
[name], [name]               ACCOUNT NO.:  [id number]   MEDICAL RECORD NO.:  [id number]          PATIENT TYPE:  INP   LOCATION:  0102                         FACILITY:  [facility]   PHYSICIAN:  Jackie Plum, M.D.DATE OF BIRTH:  March 11, 1956   DATE OF ADMISSION:  11/11/2004  DATE OF DISCHARGE:                                HISTORY & PHYSICAL   CHIEF COMPLAINT:  Alcohol withdrawal.   HISTORY OF PRESENT ILLNESS:  The patient is a 55 year old alcoholic and  polysubstance abuser with a history of COPD, who was petitioned to the  courts by his father for alcohol detox.  He was sent to [facility], whereupon his CIWA score was thought to be too high for  psychiatric admission and they requested interim medical management, and he  was therefore, brought to [facility] ED for further evaluation.   On admission, the patient was noted to be in withdrawal.  He was given two  doses of Ativan.  Labwork obtained.  The hospitalist service was  subsequently asked for admission.  The patient denies any chest pain,  shortness of breath, nausea, vomiting, headaches, visual changes, frequency  of micturition, polyuria, polydipsia.  He denies any hallucinations,  auditory or visual.  However, he admits to the bugs crawling all over his  body.   PAST MEDICAL HISTORY:  1.  History of alcohol abuse and polysubstance abuse as mentioned above.  2.  History of COPD.  3.  Hepatitis C.   FAMILY HISTORY:  Positive for strokes.   MEDICATIONS:  The patient is not on any medications at the moment, however,  review of E chart indicated that he was discharged from [facility] on 02/24/04 on Combivent inhaler 2 puffs b.i.d. and multivitamins  with iron amongst other short term medications.   SOCIAL HISTORY:  The patient does about 4 quarts of alcohol daily.  He  smokes cigarettes.  Abuses cocaine and other illicit substances.   REVIEW OF SYSTEMS:  Significant positives and  negatives were negative on 10  point systemic inquiry.   PHYSICAL EXAMINATION:  Blood pressure was 149/91, pulse 103 but had come  down to 90, temperature 97.0 degrees Fahrenheit, respirations 18, O2  saturation 95% on room air.  On the date of admission, the patient was not  in acute cardiopulmonary distress.   CNS:  The patient was alert and oriented x 3.  His pupils were equal, round,  reactive to light.  Extraocular movements were intact.  Power and sensory  modalities were within normal limits.  He had generalized tremulousness of  his upper extremities aggravated by the outstretching of his hands.  NECK:  Supple.  No JVD.  HEENT:  Normocephalic, atraumatic.  He did not have any conjunctival pallor  or icterus.  Oropharynx was moist.  No erythema.  LUNGS:  Clear breath sounds.  No crackles or wheezes.  CARDIAC:  Regular, no gallops.  ABDOMEN:  Soft, nontender.  Bowel sounds present.  EXTREMITIES:  No cyanosis, no edema.   LABWORK:  Sodium 140, potassium 3.9, chloride 109, CO2 25, glucose 82,  BUN  15, creatinine 1.0, calcium 8.7, alcohol level was 176.  Drugs of abuse  screen was positive for cocaine and negative for benzodiazepines.   IMPRESSION:  Alcohol and benzodiazepine withdrawal.  The patient will be put  on alcohol withdrawal protocol.  He will be started on Ativan.  Will start  him on supportive care with IV fluids, thiamine, folate amongst others.  He  will be monitored on telebed.  Will obtain 12-lead EKG and CBC.  We will  also get GI and DVT prophylaxis and be referred to alcohol rehabilitation  when he is more stabilized and improved.      GO/MEDQ  D:  11/11/2004  T:  11/11/2004  Job:  [id number]

## 2010-12-21 NOTE — H&P (Signed)
[name], [name]               ACCOUNT NO.:  [id number]   MEDICAL RECORD NO.:  [id number]          PATIENT TYPE:  INP   LOCATION:  2037                         FACILITY:  [facility]   PHYSICIAN:  Altha Harm, MDDATE OF BIRTH:  03-17-1956   DATE OF ADMISSION:  11/10/2006  DATE OF DISCHARGE:                              HISTORY & PHYSICAL   CHIEF COMPLAINT:  Chest pain.   HISTORY OF PRESENT ILLNESS:  This is a 55 year old gentleman who is in  custody of the police who presents to the emergency room with complaints  of chest pain.  The patient states the chest pain started when he was in  the custody of the police at the police station and he states that the  pain was a sharp pain in the left anterior chest wall, non-radiating,  not accompanied by any shortness of breath or diuresis and lasting  approximately 3 to 5 minutes.  The patient states that the pain went  away then reappeared when he was in the emergency room.  Workup in the  emergency room showed negative enzymes; however, did show some  repolarization changes on the EKG and given the fact that the patient  was in the custody of the police, the emergency room physician felt that  the risk was to high to send the patient out and the patient needed to  be ruled out for an MI.  We were asked to admit the patient for this  rule out MI workup.   PAST MEDICAL HISTORY:  Significant for:  1. Hypertension.  2. Hyperlipidemia.  3. Headaches.   FAMILY HISTORY:  Significant for:  1. Coronary artery disease.  2. Hypertension.  3. Peripheral vascular disease.   SOCIAL HISTORY:  The patient resides with his wife, Lurena Joiner whose phone  number is [phone].  He works as a Nutritional therapist.  He smokes approximately  1 pack a day of cigarettes for 34 years.  He binge drinks on the  weekend, a 12-pack on the weekends and he uses no drugs.   ALLERGIES:  NO KNOWN DRUG ALLERGIES.   CURRENT MEDICATIONS:  Include Catapres 0.1 mg p.o.  daily.   PRIMARY CARE PHYSICIAN:  Dr. Azucena Cecil.   REVIEW OF SYSTEMS:  Fourteen systems are reviewed, all systems are  negative, except for the patient states that he is having periods of  anxiety and shaking and all that is noted in the HPI.   PHYSICAL EXAMINATION:  GENERAL:  The patient is resting comfortably in  no acute distress.  Police officers are present at bedside initially and  had stepped out of the room for the patient's interview and examination.  VITAL SIGNS:  Are as follows: Blood pressure 151/107, heart rate 76,  respiratory rate 22.  HEENT:  The patient is normocephalic, atraumatic.  Pupils equal, round,  reactive to light and accommodation.  Extraocular movements are intact.  Tympanic membranes are translucent bilaterally with good landmarks.  Oropharynx is moist, no exudate, erythema, or lesions noted.  Nasal  mucosa shows no polyps.  NECK EXAMINATION:  Trachea is midline.  No masses.  No  thyromegaly.  No  JVD.  No carotid bruits.  LUNG EXAMINATION:  The patient has a normal respiratory effort.  Clear  to auscultation bilaterally.  No wheezes, rales, or rhonchi noted on the  examination.  CARDIOVASCULAR EXAMINATION:  He has a normal S1, S2.  No murmurs, rubs,  or gallop noted.  PMI is nondisplaced.  No heaves or thrills on  palpation.  ABDOMINAL EXAMINATION:  The patient has normoactive bowel sounds.  Abdomen is soft, nontender, nondistended.  No masses, no  hepatosplenomegaly.  LYMPH NODES:  The patient has no cervical, axillary, or inguinal  lymphadenopathy noted.  MUSCULOSKELETAL:  He has no spinal tenderness.  No warmth, swelling, or  erythema around joints.  NEUROLOGICAL:  The patient has a 5/5 strength in bilateral upper and  lower extremities.  Sensation is intact to light touch and  proprioception.  DTRs are 2+ bilaterally in the upper and lower  extremities.  There is no focal or neurological deficits noted.  PSYCHIATRIC:  The patient is anxious.   He is alert and oriented x3.  Good cognition and insight.  Good recent and remote recall.   ASSESSMENT AND PLAN:  1. This patient who presents with chest pain.  The patient was placed      in the chest pain protocol for rule out myocardial infarction.  He      will be given serial enzymes, if these are negative he should      probably undergo a stress test.  2. Tobacco use disorder.  The patient is off the nicotine patch.  3. Anxiety.  The patient will be offered any anxiolytic agents,      particularly given his chest pain.  4. Hypertension, uncontrolled.  The patient will be given intravenous      Lopressor and then placed on metoprolol 25 mg p.o. b.i.d.      Altha Harm, MD  Electronically Signed     MAM/MEDQ  D:  11/10/2006  T:  11/11/2006  Job:  [id number]

## 2010-12-21 NOTE — Discharge Summary (Signed)
[name], [name]               ACCOUNT NO.:  [id number]   MEDICAL RECORD NO.:  [id number]          PATIENT TYPE:  INP   LOCATION:  1403                         FACILITY:  [facility]   PHYSICIAN:  Wilson Singer, M.D.DATE OF BIRTH:  02/08/1956   DATE OF ADMISSION:  02/20/2007  DATE OF DISCHARGE:  03/05/2007                               DISCHARGE SUMMARY   ADDENDUM:   FINAL DIAGNOSES:  1. Opiate withdrawal.  2. Alcohol withdrawal.  3. Neutropenia.  4. Hepatitis C positive.  5. Elevated creatinine.  6. Tobacco abuse.   HISTORY:  Please see discharge summary by Dr. Jamison Oka.  The  patient, since I started seeing him on March 04, 2007, seemed to be  agitated, but was willing to wait to go to inpatient psychiatric unit.  Unfortunately, on March 05, 2007, although he was medically stable with  vital signs of temperature of 98.5, blood pressure 143/89, pulse 62,  saturation 97% on room air, the patient left of his own will without  signing a form against medical advice and was nowhere to be found.  Unfortunately, he did not go to inpatient psychiatric unit as planned  and he therefore by definition has been lost to followup.      Wilson Singer, M.D.  Electronically Signed     NCG/MEDQ  D:  03/10/2007  T:  03/11/2007  Job:  [id number]

## 2010-12-21 NOTE — Discharge Summary (Signed)
[name], [name]                         ACCOUNT NO.:  [id number]   MEDICAL RECORD NO.:  [id number]                   PATIENT TYPE:  IPS   LOCATION:  0305                                 [facility]   PHYSICIAN:  Jeanice Lim, M.D.              DATE OF BIRTH:  [birth year]   DATE OF ADMISSION:  08/10/2003  DATE OF DISCHARGE:  08/15/2003                                 DISCHARGE SUMMARY   IDENTIFYING DATA:  This is a 55 year old married Caucasian male voluntarily  admitted.  Second detox admission.  Using alcohol daily since 2003.  Last  detoxed and stayed sober just a few days.  Had been drinking since the age  of 73.  Usually drinks a half-gallon of liquor daily.  Rare cocaine use.  Positive history of blackouts.  No known history of seizures.   MEDICATIONS:  None.   ALLERGIES:  No known drug allergies.   PHYSICAL EXAMINATION:  Within normal limits.  Neurologically nonfocal.   LABORATORY DATA:  Routine admission labs within normal limits.   MENTAL STATUS EXAM:  Fully alert, tremulous, cooperative, anxious.  Speech  somewhat stuttering due to withdrawal symptoms.  Anxious, depressed, guilty.  Thought processes goal directed.  Thought content negative for dangerous  ideation or psychotic symptoms.  Cognitively intact.  Judgment and insight  fair.   ADMISSION DIAGNOSES:   AXIS I:  1. Alcohol dependence and acute withdrawal.  2. Cocaine abuse history.   AXIS II:  Deferred.   AXIS III:  None.   AXIS IV:  Moderate (stressors with primary support group).   AXIS V:  28/60.   HOSPITAL COURSE:  The patient was admitted and ordered routine p.r.n.  medications and underwent further monitoring.  Was encouraged to participate  in individual, group and milieu therapy.  The patient was changed off of  Librium and switched to phenobarbital due to severity and _________ of  alcohol use for safe detox and liver ammonia and Protonix was ordered.  The  patient was monitored  medically.  Complained of severe withdrawal symptoms  and difficulty sleeping.  The patient complained of severe pain, had  decreased withdrawal symptoms.  The patient was willing to follow up with  [facility].   CONDITION ON DISCHARGE:  At the time of discharge, the patient was markedly  improved.  No acute withdrawal symptoms.  No dangerous ideation.  Reported  motivation to be compliant with the aftercare plan.   DISCHARGE MEDICATIONS:  1. Protonix 40 mg q.a.m.  2. Ambien 10 mg q.h.s.  3. Neurontin 200 mg q.a.m., at 3 p.m. and 2 q.h.s.  4. Ventolin inhaler 2 puffs q.6h.  5. Vicodin 5\500 mg 2 tabs q.6h.   FOLLOW UP:  The patient was to follow up with [facility] starting on Monday and was  to attend Monday through Friday until 11 a.m. or 1-4 p.m.   DISCHARGE DIAGNOSES:   AXIS I:  1. Alcohol dependence and acute withdrawal.  2. Cocaine abuse history.   AXIS II:  Deferred.   AXIS III:  None.   AXIS IV:  Moderate (stressors with primary support group).   AXIS V:  Global Assessment of Functioning on discharge 55.                                               Jeanice Lim, M.D.    JEM/MEDQ  D:  09/12/2003  T:  09/12/2003  Job:  [id number]

## 2010-12-21 NOTE — H&P (Signed)
[name], [name]               ACCOUNT NO.:  [id number]   MEDICAL RECORD NO.:  [id number]         PATIENT TYPE:  [name]   LOCATION:                                [name]   PHYSICIAN:  [name], M.D.      DATE OF BIRTH:  [birth year]   DATE OF ADMISSION:  12/19/2004  DATE OF DISCHARGE:                         PSYCHIATRIC ADMISSION ASSESSMENT   IDENTIFYING INFORMATION:  This is a 55 year old separated white male,  voluntarily admitted on Dec 19, 2004.   HISTORY OF PRESENT ILLNESS:  The patient presents with a history of  polysubstance abuse, drinking 1/2 gallon of Jim Beam daily, drinking  consistently for approximately 1 year, also has been using benzodiazepines,  Valium for the past 8 months, taking 7 or 8 of those per day, abusing  opiates, with a past history of IV drug use, also using cocaine.  The  patient reports his longest of sobriety has been 8-9 months.  He denies any  suicidal thoughts.  He reports having significant financial stress.  Reports  he also has had a fall approximately 10 days ago.  Denies any head injury.   PAST PSYCHIATRIC HISTORY:  Second admission.  The patient was here for  alcohol detox in the past, [name]  He  denies any history of suicide attempt.  He is currently sponsored by  [name] and he reports having outpatient treatment by  a nurse at [name].   SOCIAL HISTORY:  He is a 55 year old separated white male, has an 18-year-  old daughter.  He lives with his father.  He works as a Nutritional therapist.  Denis any  legal issues.   FAMILY HISTORY:  Unclear.   ALCOHOL DRUG HISTORY:  The patient smokes.  His drinking habits are  described above.  He reports episodes of alcohol-related seizures.  He  reports last seizure was a month ago, with DT's and reports drinking in the  morning.   PAST MEDICAL HISTORY:  Primary care [name] is none, medical problems are  none.   MEDICATIONS:  None.   DRUG ALLERGIES:  No known allergies.   PHYSICAL EXAMINATION:  The patient was assessed at [name].  This is a  disheveled middle-aged male, appears to be having some anxiety and shaking,  complaining of discomfort.  Temperature is 98.6, 84 heart rate, 18  respirations, blood pressure 127/86.  He is 154 pounds, he is 6 feet tall.  The patient also has some bruises and scabs to his right chest as he states  from a fall approximately 10 days ago.  ER records indicate that the patient  was crying, biting his fingernails, also states that he was in the emergency  department the night before and had used an alias. The patient did receive  IV fluids and Phenergan while in ED.   LABORATORY DATA:  CBC was within normal limits..  Alcohol level was elevated  at 333, down to 222 prior to transfer.  Urine drug screen was positive for  benzodiazepines, positive for opiates.  SGOT is  elevated at 48.   MENTAL STATUS EXAM:  He is a disheveled middle-aged male, complaining of  pain, shaking.  Speech is clear.  The patient states he is hurting and  anxious.  The patient appears anxious.  Thought processes are coherent, goal  directed.  Cognitive function intact.  Memory is fair.  Judgment and insight  is poor, poor impulse control.  He is a questionable historian.   ADMISSION DIAGNOSES:   AXIS I:  1.  Alcohol dependence.  2.  Polysubstance abuse.   AXIS II:  Deferred.   AXIS III:  None.   AXIS IV:  Other psychosocial problems, financial issues.   AXIS V:  Current is 30, estimated this past year is 55-60.   PLAN:  Plan is admission for alcohol detox and polysubstance abuse.  We will  work on relapse prevention, put patient on a low-dose Librium protocol,  encourage fluids.  The patient is to attend all individual and group  therapy.  We will stabilize the patient's mood and thinking.  Case manager  is to look at any potential rehab programs available.   TENTATIVE  LENGTH OF CARE:  4-5 days.      JO/MEDQ  D:  12/20/2004  T:  12/20/2004  Job:  [id number]
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
## 2010-12-21 NOTE — Discharge Summary (Signed)
NAMEBRENN, DEZIEL               ACCOUNT NO.:  [id number]   MEDICAL RECORD NO.:  [id number]          PATIENT TYPE:  INP   LOCATION:  1405                         [facility]AC[facility]   PHYSICIAN:  Ladell Pier, M.D.   DATE OF BIRTH:  [birth year]   DATE OF ADMISSION:  11/19/2006  DATE OF DISCHARGE:  11/25/2006                               DISCHARGE SUMMARY   DISCHARGE DIAGNOSES:  1. Altered mental status secondary to alcohol and cocaine withdrawal      syndrome.  2. History of alcohol and cocaine abuse.  3. History of atypical chest pain, thought to be musculoskeletal in      the past.  4. Chronic kidney disease, with baseline creatinine 1.78.  5. Hypertension.  6. Dyslipidemia.  7. History of peripheral vascular disease.  8. Elevated creatine kinase, decreased prior to discharge.  9. Abdominal pain, with negative x-ray and ultrasound.   DISCHARGE MEDICATIONS:  1. Aspirin 325 mg daily.  2. Zocor 20 mg daily.  3. Thiamine 100 mg daily.  4. Folate 1 mg daily.  5. Clonidine 0.1 mg 3 times daily.  6. Ativan 1 mg 3 times daily as needed.   FOLLOW-UP APPOINTMENT:  Patient to follow up with primary care  physician.   HISTORY OF PRESENT ILLNESS:  The patient is a 55 year old white male  that came into the emergency room with altered mental status.  The  admission report was provided by the ER physician, as the patient was  sedated at the time of admission. Patient brought in by EMS.  Complained  of withdrawal from alcohol and cocaine.  He was noted be very agitated  and tremulous and was admitted for further evaluation.   PAST MEDICAL HISTORY/FAMILY HISTORY/SOCIAL  HISTORY/MEDICATIONS/ALLERGIES/ REVIEW OF SYSTEMS:  Per admission H&P.   PHYSICAL EXAMINATION ON DISCHARGE:  VITAL SIGNS:  Temperature 97.6,  pulse 57, respirations 20, blood pressure 125/88, pulse oximetry 91% on  room air.  HEENT:  Head is normocephalic, atraumatic.  Pupils equal, round, and  reactive to light.   Throat without erythema.  CARDIOVASCULAR:  Regular rate and rhythm.  LUNGS:  Clear bilaterally.  ABDOMEN:  Positive bowel sounds.  EXTREMITIES:  No edema.   HOSPITAL COURSE:  1. Polysubstance abuse/withdrawal syndrome:  The patient was put on an      Ativan protocol but continued to experience withdrawal symptoms.      The Ativan protocol was discontinued.  He was placed on a Librium      taper.  He did well with that.  The patient was not financially      able to get into an inpatient treatment program.  He will call to      find out facilities that are available to him when he goes home.      He feels fine today.  2. Tobacco abuse:  The patient was given a nicotine patch while he was      inpatient.  3. Hypertension:  Blood pressure was elevated on several occasions,      along with pulse.  Most likely secondary to withdrawal syndrome.  His metoprolol was discontinued with his history of cocaine use.      He was continued on clonidine.  His clonidine was increased by 0.1.      He was discharged on clonidine 0.1 t.i.d.  4. Dyslipidemia:  He was continued on his Zocor while he was in the      hospital.  5. Elevated creatine kinase:  Creatine kinase was most likely elevated      secondary to his fall and his withdrawal syndrome.  His creatine      kinase trended down during his hospitalization.  6. Abdominal pain.  He complained of abdominal pain during his      hospitalization. He had an ultrasound and x-ray done that were      benign.   DISCHARGE LABORATORIES:  CK 483. Urine culture negative.  PTT 38.  PT  13.6, INR 1.  Liver functions:  AST 58, ALT 21, total protein 5.6,  bilirubin 0.5.  X-ray normal, except for mild inhomogeneous echotexture  of the liver. X-ray showed hepatomegaly, otherwise normal.      Ladell Pier, M.D.  Electronically Signed     NJ/MEDQ  D:  11/25/2006  T:  11/25/2006  Job:  [id number]
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
## 2010-12-21 NOTE — Discharge Summary (Signed)
NAMEANAN, DAPOLITO               ACCOUNT NO.:  [id number]   MEDICAL RECORD NO.:  [id number]          PATIENT TYPE:  INP   LOCATION:  4715                         FACILITY:  [facility]   PHYSICIAN:  Edsel Petrin, D.O.DATE OF BIRTH:  Jul 13, 1956   DATE OF ADMISSION:  04/04/2007  DATE OF DISCHARGE:  04/09/2007                               DISCHARGE SUMMARY   DISCHARGE DIAGNOSES:  1. Severe alcohol abuse, with acute alcohol withdrawal  2. Olecranon bursitis.  3. Hypertension.  4. Hepatitis C, stable.  5. Chronic renal insufficiency with a baseline creatinine of 1.3 to      1.4.  6. Tobacco abuse.  7. Homelessness.  8. History of heroin addiction.   DISCHARGE MEDICATIONS:  1. Tramadol 50 mg p.o. b.i.d. p.r.n. for pain.  2. Nicotine patch 21 mg q.24 hours.  3. Thiamine 100 mg p.o. daily.  4. Folic acid 1 mg p.o. daily.  5. Lorazepam 0.5 mg p.o. nightly for 2 weeks.   DISPOSITION:  Mr. Ryner was discharged in stable and improved condition.  There were multiple failed attempts at finding an inpatient rehab  situation for Mr. Castell and his multiple issues with polysubstance  abuse.  After a prolonged detoxification process in the hospital and  intervention by clinical social work, we decided to discharge him to  [facility] which is a [facility] homeless [facility] through [employer].  He was given vouchers for transportation.  They also have a program with  [facility] to get him to the outpatient substance abuse program.  Once  evaluated by the [facility] program and behavioral health, further efforts  can be made towards getting him into a chemical dependency residential  rehabilitation program.   BRIEF ADMITTING HISTORY AND PHYSICAL:  Admission vital signs:  Temperature 100.3, blood pressure 176/102, pulse 90, respiratory rate  22, O2 sats 97% on room air.   Mr. Doughten is a 55 year old white man with a history of alcohol abuse and  heroin abuse who has had multiple hospital admissions  for alcohol  withdrawal and detoxification but has not been able to successfully  complete outpatient or inpatient rehabilitation programs.  He his  actively seeking help for his addiction in terms of symptom management  and a long-term plan for controlling his chemical dependency.   ADMISSION LABORATORY:  Sodium 139, potassium 4.2, chloride 111, bicarb  18, BUN 27, creatinine 1.8, glucose 79, bilirubin 0.4, alk phos 62, SGOT  39, SGPT 51, albumin 2.6, calcium 8.3.  Magnesium 1.9, lipase 63.  Alcohol level 18.  Hemoglobin 11.4, white blood cells 4.5.   X-ray of left upper extremity showed no definite fracture.   CONSULTATIONS:  Dr. Antonietta Breach, psychiatry.   HOSPITAL COURSE BY PROBLEMS:  1. Acute alcohol withdrawal:  Patient has a long history of      polysubstance abuse; was admitted in acute alcohol withdrawal.  His      last drink was 3 days prior to admission.  He was requesting      inpatient rehabilitation and symptom management.  He was placed on      the CIWA protocol  with an Ativan taper.  There were multiple issues      with his behavior while being cared for in the hospital.  He did      have hallucinations, tremors and episodes of combative and      belligerent behavior.  These symptoms were controlled with a      combination of Ativan and Haldol.  After he was stabilized in terms      of his detox symptoms, extensive efforts were made to find him an      inpatient rehabilitation program; however, given his lack of health      insurance and unavailable program, this did not occur.  He was      managed through the [facility] program in the outpatient setting.  2. Left elbow pain:  There were no acute fracture seen on imaging of      his elbow.  He was given tramadol to control his pain.  He likely      has an olecranon bursitis and was treated symptomatically with      rest, ice and elevation.  3. Hypertension:  On admission, his blood pressure was elevated,      likely in  acute setting of alcohol withdrawal.  He was given p.r.n.      labetalol for systolic blood pressures greater than 200.  His blood      pressure stabilized after his detoxification and was well-      controlled on his home medication regimen.  4. Chronic renal insufficiency:  This is likely from his underlying      chronic hypertension.  His baseline creatinine is around 1.4.  On      admission, his creatinine was 1.8; this responded to IV fluid      hydration.  5. Hepatitis C:  His liver function tests were only mildly elevated at      admission.  There was no evidence of any active liver disease      including cirrhosis.  He was counseled on the ill effects of      alcohol and hepatitis C infection.  6. Polysubstance abuse:  On admission, Mr. Kirn reported that he was      in the process of seeking out heroin and had actively used heroin      within the last 2 weeks.  It is unclear at this time how much of      his acute presentation was secondary to opiate withdrawal in      addition to his heavy alcohol use.  He did not receive any opiates      while in the hospital but was given tramadol at discharge for his      elbow pain.   DISCHARGE VITAL SIGNS:  Temperature 98.5, blood pressure 140/74, pulse  78, respiratory rate 18.  Hemoglobin 10.6, white blood cells 3.1, sodium  140, potassium 4.4, chloride 112, bicarb 24, BUN 20, creatinine 1.4,  glucose 104.      Edsel Petrin, D.O.  Electronically Signed     ELG/MEDQ  D:  06/09/2007  T:  06/10/2007  Job:  [id number]
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
## 2011-01-08 ENCOUNTER — Other Ambulatory Visit: Payer: Self-pay | Admitting: Vascular Surgery

## 2011-01-08 ENCOUNTER — Ambulatory Visit (HOSPITAL_COMMUNITY): Payer: Medicare Other | Attending: Vascular Surgery

## 2011-01-08 DIAGNOSIS — N186 End stage renal disease: Secondary | ICD-10-CM | POA: Insufficient documentation

## 2011-01-08 DIAGNOSIS — Z452 Encounter for adjustment and management of vascular access device: Secondary | ICD-10-CM

## 2011-01-08 DIAGNOSIS — I120 Hypertensive chronic kidney disease with stage 5 chronic kidney disease or end stage renal disease: Secondary | ICD-10-CM

## 2011-01-15 LAB — CULTURE, BLOOD (ROUTINE X 2): Culture: NO GROWTH

## 2011-01-28 ENCOUNTER — Emergency Department (HOSPITAL_COMMUNITY): Payer: Medicare Other

## 2011-01-28 ENCOUNTER — Inpatient Hospital Stay (HOSPITAL_COMMUNITY)
Admission: EM | Admit: 2011-01-28 | Discharge: 2011-02-01 | DRG: 438 | Disposition: A | Discharge status: Home / Self Care | Payer: Medicare Other | Attending: Internal Medicine | Admitting: Internal Medicine

## 2011-01-28 ENCOUNTER — Encounter: Payer: Self-pay | Admitting: Internal Medicine

## 2011-01-28 DIAGNOSIS — N2581 Secondary hyperparathyroidism of renal origin: Secondary | ICD-10-CM | POA: Diagnosis present

## 2011-01-28 DIAGNOSIS — F1020 Alcohol dependence, uncomplicated: Secondary | ICD-10-CM | POA: Diagnosis present

## 2011-01-28 DIAGNOSIS — K861 Other chronic pancreatitis: Secondary | ICD-10-CM | POA: Diagnosis present

## 2011-01-28 DIAGNOSIS — D61818 Other pancytopenia: Secondary | ICD-10-CM | POA: Diagnosis present

## 2011-01-28 DIAGNOSIS — N186 End stage renal disease: Secondary | ICD-10-CM | POA: Diagnosis present

## 2011-01-28 DIAGNOSIS — K859 Acute pancreatitis without necrosis or infection, unspecified: Principal | ICD-10-CM | POA: Diagnosis present

## 2011-01-28 DIAGNOSIS — K625 Hemorrhage of anus and rectum: Secondary | ICD-10-CM | POA: Diagnosis not present

## 2011-01-28 DIAGNOSIS — I120 Hypertensive chronic kidney disease with stage 5 chronic kidney disease or end stage renal disease: Secondary | ICD-10-CM | POA: Diagnosis present

## 2011-01-28 DIAGNOSIS — R188 Other ascites: Secondary | ICD-10-CM | POA: Diagnosis present

## 2011-01-28 DIAGNOSIS — Z992 Dependence on renal dialysis: Secondary | ICD-10-CM

## 2011-01-28 DIAGNOSIS — F10239 Alcohol dependence with withdrawal, unspecified: Secondary | ICD-10-CM | POA: Diagnosis present

## 2011-01-28 DIAGNOSIS — F10939 Alcohol use, unspecified with withdrawal, unspecified: Secondary | ICD-10-CM | POA: Diagnosis present

## 2011-01-28 DIAGNOSIS — E876 Hypokalemia: Secondary | ICD-10-CM | POA: Diagnosis present

## 2011-01-28 LAB — LIPASE, BLOOD: Lipase: 168 U/L — ABNORMAL HIGH (ref 11–59)

## 2011-01-28 LAB — DIFFERENTIAL
Eosinophils Relative: 0 % (ref 0–5)
Lymphs Abs: 1.1 10*3/uL (ref 0.7–4.0)
Monocytes Relative: 2 % — ABNORMAL LOW (ref 3–12)
Neutro Abs: 0.7 10*3/uL — ABNORMAL LOW (ref 1.7–7.7)

## 2011-01-28 LAB — CBC
HCT: 29.6 % — ABNORMAL LOW (ref 39.0–52.0)
Hemoglobin: 10.4 g/dL — ABNORMAL LOW (ref 13.0–17.0)
MCH: 31.9 pg (ref 26.0–34.0)
MCHC: 35.1 g/dL (ref 30.0–36.0)

## 2011-01-28 LAB — PROTIME-INR: Prothrombin Time: 14 seconds (ref 11.6–15.2)

## 2011-01-28 LAB — COMPREHENSIVE METABOLIC PANEL
BUN: 33 mg/dL — ABNORMAL HIGH (ref 6–23)
Calcium: 8.7 mg/dL (ref 8.4–10.5)
Creatinine, Ser: 3.88 mg/dL — ABNORMAL HIGH (ref 0.50–1.35)
GFR calc Af Amer: 20 mL/min — ABNORMAL LOW (ref >60)
Glucose, Bld: 95 mg/dL (ref 70–99)
Total Protein: 6.9 g/dL (ref 6.0–8.3)

## 2011-01-28 LAB — MRSA PCR SCREENING: MRSA by PCR: NEGATIVE

## 2011-01-28 LAB — CK TOTAL AND CKMB (NOT AT ARMC)
Relative Index: INVALID (ref 0.0–2.5)
Total CK: 73 U/L (ref 7–232)

## 2011-01-28 LAB — APTT: aPTT: 36 seconds (ref 24–37)

## 2011-01-28 LAB — MAGNESIUM: Magnesium: 1.8 mg/dL (ref 1.5–2.5)

## 2011-01-28 LAB — ETHANOL: Alcohol, Ethyl (B): <11 mg/dL (ref 0–11)

## 2011-01-28 NOTE — H&P (Signed)
Hospital Admission Note Date: 01/28/2011  Patient name: [name] Medical record number: [id number] Date of birth: 06/29/1956 Age: 55 y.o. Gender: male PCP: Melida Quitter, MD  Medical Service:  Attending physician:    Dr. Blanch Media Resident [phone]):    Dr. Eben Burow [phone] Resident (R1):    Dr. Anselm Jungling [phone]  Chief Complaint: abdominal pain  History of Present Illness: This is a 55 year old gentleman with past medical history significant for chronic alcohol intoxication, end-stage renal disease on hemodialysis, hepatitis C, and chronic alcoholic pancreatitis with multiple admissions for acute exacerbation who presented to [facility] ED with main concern of generalized abdominal pain, worse on left, radiating to his back, constant, 10/10 in severity with no specific aggravating or alleviating factors, except  Associated with nausea and nonbloody vomiting, watery diarrhea.  He is unable to quantify the amount and reports similar episodes of pain in the past and he also reports this is his typical pancreatitis flare.  He denies drinking alcohol or using illicit drugs. He reports compliance with dialysis treatment. He denies chest pain or shortness of breath at the moment but reports experiencing occasional episodes, denies fever, chills, or other systemic symptoms. He reports drinking daily and last drink was yesterday.  Current Outpatient Prescriptions  Medication Sig Dispense Refill  . albuterol (VENTOLIN HFA) 108 (90 BASE) MCG/ACT inhaler Inhale 2 puffs into the lungs every 6 (six) hours as needed.        Marland Kitchen amylase-lipase-protease (LIPRAM-CR10) 33.09-15-35.5 MU per capsule Take 3 capsules by mouth 3 (three) times daily with meals.        . B Complex-C-Folic Acid (RENA-VITE RX) 1 MG TABS Take 1 tablet by mouth daily.        . calcium acetate (PHOSLO) 667 MG capsule Take 667 mg by mouth 3 (three) times daily with meals.        . clonazePAM (KLONOPIN) 0.5 MG tablet Take 1 tablet (0.5 mg  total) by mouth 2 (two) times daily as needed.  30 tablet  0  . folic acid (FOLVITE) 1 MG tablet Take 1 mg by mouth daily.        Marland Kitchen gabapentin (NEURONTIN) 100 MG capsule Take 1 capsule (100 mg total) by mouth 3 (three) times daily.  90 capsule  2  . Iron Sucrose (VENOFER IV) Inject into the vein.        Marland Kitchen NIFEdipine (PROCARDIA XL/ADALAT-CC) 30 MG 24 hr tablet Take 1 tablet (30 mg total) by mouth daily.  30 tablet  11  . omeprazole (PRILOSEC) 40 MG capsule Take 40 mg by mouth daily.        Marland Kitchen oxyCODONE-acetaminophen (PERCOCET) 5-325 MG per tablet Take 1 tablet by mouth every 6 (six) hours as needed.  30 tablet  0  . pantoprazole (PROTONIX) 40 MG tablet Take 1 tablet (40 mg total) by mouth daily.  30 tablet  1  . thiamine 100 MG tablet Take 100 mg by mouth daily.        Marland Kitchen tiotropium (SPIRIVA) 18 MCG inhalation capsule Place 18 mcg into inhaler and inhale daily.        . traMADol (ULTRAM) 50 MG tablet Take 50 mg by mouth every 6 (six) hours as needed.          Allergies: Review of patient's allergies indicates no known allergies.  Past Medical History  Diagnosis Date  . Pancytopenia      chronic  . Polysubstance abuse     chronic most notable for alcohol  .  End-stage renal disease on hemodialysis   . Malignant hypertension   . Hepatitis C   . COPD (chronic obstructive pulmonary disease)   . Chronic recurrent pancreatitis     likely secondary to alcoholism  . Smoker   . Alcohol abuse   . Respiratory failure Jan 2012    Hx of VDRF   . Alcohol withdrawal   . Pancreatitis, alcoholic     No past surgical history on file.  Family History  Problem Relation Age of Onset  . Hypertension Mother   . Alcohol abuse    . Anxiety disorder    . Hyperlipidemia    . Stroke      History   Social History  . Marital Status: Divorced   Social History Main Topics  . Smoking status: Current Everyday Smoker -- 0.5 packs/day for 15 years    Types: Cigarettes  . Alcohol Use: Yes  . Drug Use:  Yes    Social History Narrative    unemployed divorced , used to drink one bottle of whiskey for years.    Review of Systems: Per HPI  Physical Exam:  Temperature 98.7 Blood pressure 1800/100 Heart rate 104 Respiratory rate 18 Saturation 98% on room air.    GENERAL:  The patient appears malnourished, in no acute distress, sitting in the bed, and cooperative to examination. Tremors at rest  HEAD:  Normocephalic and atraumatic.   EARS:  Tympanic membranes normal bilaterally.   MOUTH:  No erythema or exudates, very dry mucous membranes.   EYES:  PERRL, EOMI, conjunctivae normal, no sclerae icterus.  Pupils round and reactive bilaterally.   NECK:  Supple.  Trachea midline.  No JVD.   CARDIOVASCULAR:  Tachycardic.  S1 and S2 normal, no murmurs  PULMONARY:  Chest clear to auscultation bilaterally.   ABDOMEN:  Soft, diffuse tenderness throughout on palpation, nondistended.  No mild guarding present.  Bowel sounds normal.   GU:  No CVA tenderness.   MUSCULOSKELETAL:  No joint deformity, erythema, or stiffness.   NEUROLOGIC:  Alert and oriented to name and place.  Strength is normal and symmetric bilaterally.  Cranial nerves II through XII are grossly intact.  No focal deficits noted.    EXTREMITIES:  A 1+ pitting edema bilaterally.  Warm, dry, and intact.  No rashes, cyanosis, or clubbing.   PSYCHIATRIC:  Normal mood and affect.  Speech is normal.   Lab results:   Sodium (NA)                              138               135-145          mEq/L  Potassium (K)                            3.3        l      3.5-5.1          mEq/L  Chloride                                 96                96-112           mEq/L  CO2  28                19-32            mEq/L  Glucose                                  95                70-99            mg/dL  BUN                                      33         h      6-23             mg/dL  Creatinine                                3.88       h      0.50-1.35        mg/dL    **Please note change in reference range.**  GFR, Est Non African American            16         l      >60              mL/min  GFR, Est African American                20         l      >60              mL/min    Oversized comment, see footnote  1  Bilirubin, Total                         0.8               0.3-1.2          mg/dL  Alkaline Phosphatase                     61                39-117           U/L  SGOT (AST)                               59         h      0-37             U/L  SGPT (ALT)                               35                0-53             U/L  Total  Protein                           6.9  6.0-8.3          g/dL  Albumin-Blood                            3.0        l      3.5-5.2          g/dL  Calcium                                  8.7               8.4-10.5         mg/dL   Lipase                                   168        h      11-59            U/L   WBC                                      1.8        l      4.0-10.5         K/uL  RBC                                      3.26       l      4.22-5.81        MIL/uL  Hemoglobin (HGB)                         10.4       l      13.0-17.0        g/dL  Hematocrit (HCT)                         29.6       l      39.0-52.0        %  MCV                                      90.8              78.0-100.0       fL  MCH -                                    31.9              26.0-34.0        pg  MCHC                                     35.1              30.0-36.0        g/dL  RDW  19.1       h      11.5-15.5        %  Platelet Count (PLT)                     19         L      150-400          K/uL   Coags WNL  Imaging results:   CT abdomen pending CXR: No acute cardiopulmonary findings  Assessment & Plan by Problem:  1) Abdominal pain - likely secondary to acute pancreatitis in the setting of alcohol intoxication with  elevated lipase and physical exam findings of significant tenderness on the left side of the abdomen.  Other differential diagnosis included       peptic ulcer disease, alcohol-induced gastritis with noncompliance, cholecystitis, cholelithiasis.    PLAN: - admit patient to step down and monitor vitals - obtain CT of the abdomen - supportive care with IV fluids, keep NPO, Zofran IV for nausea, pain control - follow up on amylase  2) Alcohol withdrawal - pt EtOH elvels not detectable but pt reports daily drinking  PLAN: - start on CIWA protocol - Ativan, Thiamine, Folic acid per CIWA - consult for cessation  3) Thrombocytopenia - likely secondary to alcohol induced bone marrow suppression - obtain cbc in AM - pt has no signs of bleeding, Hg/Hct at baseline  4) ESRD - completed HD today and next scheduled on Wednesday  PLAN: - renal panel in the AM - renal consult for HD on Wed  5) DVT prophylaxis - Heparin 5000 units Q8   Sydnei Ohaver Magick

## 2011-01-29 ENCOUNTER — Encounter (HOSPITAL_COMMUNITY): Payer: Self-pay | Admitting: Radiology

## 2011-01-29 ENCOUNTER — Other Ambulatory Visit (HOSPITAL_COMMUNITY): Payer: Medicare Other

## 2011-01-29 ENCOUNTER — Inpatient Hospital Stay (HOSPITAL_COMMUNITY): Payer: Medicare Other

## 2011-01-29 DIAGNOSIS — F10231 Alcohol dependence with withdrawal delirium: Secondary | ICD-10-CM

## 2011-01-29 DIAGNOSIS — D61818 Other pancytopenia: Secondary | ICD-10-CM

## 2011-01-29 DIAGNOSIS — R079 Chest pain, unspecified: Secondary | ICD-10-CM

## 2011-01-29 LAB — RENAL FUNCTION PANEL
CO2: 23 mEq/L (ref 19–32)
GFR calc Af Amer: 18 mL/min — ABNORMAL LOW (ref >60)
Glucose, Bld: 81 mg/dL (ref 70–99)
Phosphorus: 5.4 mg/dL — ABNORMAL HIGH (ref 2.3–4.6)
Potassium: 3.3 mEq/L — ABNORMAL LOW (ref 3.5–5.1)
Sodium: 137 mEq/L (ref 135–145)

## 2011-01-29 LAB — CBC
Hemoglobin: 9.7 g/dL — ABNORMAL LOW (ref 13.0–17.0)
MCHC: 34.2 g/dL (ref 30.0–36.0)
RBC: 3.07 MIL/uL — ABNORMAL LOW (ref 4.22–5.81)

## 2011-01-29 LAB — RETICULOCYTES
RBC.: 3.06 MIL/uL — ABNORMAL LOW (ref 4.22–5.81)
Retic Count, Absolute: 9.2 10*3/uL — ABNORMAL LOW (ref 19.0–186.0)

## 2011-01-29 LAB — IRON AND TIBC: Iron: 134 ug/dL (ref 42–135)

## 2011-01-29 LAB — DIC (DISSEMINATED INTRAVASCULAR COAGULATION)PANEL
Smear Review: NONE SEEN
aPTT: 31 seconds (ref 24–37)

## 2011-01-29 LAB — CARDIAC PANEL(CRET KIN+CKTOT+MB+TROPI)
CK, MB: 2.2 ng/mL (ref 0.3–4.0)
Relative Index: INVALID (ref 0.0–2.5)
Total CK: 59 U/L (ref 7–232)
Troponin I: <0.3 ng/mL (ref <0.30)

## 2011-01-29 LAB — DIC (DISSEMINATED INTRAVASCULAR COAGULATION) PANEL: INR: 1.06 (ref 0.00–1.49)

## 2011-01-29 LAB — LACTATE DEHYDROGENASE: LDH: 187 U/L (ref 94–250)

## 2011-01-29 LAB — CULTURE, BLOOD (ROUTINE X 2): Culture: NO GROWTH

## 2011-01-29 MED ORDER — IOHEXOL 300 MG/ML  SOLN
100.0000 mL | Freq: Once | INTRAMUSCULAR | Status: AC | PRN
  Administered 2011-01-29: 100 mL via INTRAVENOUS

## 2011-01-30 ENCOUNTER — Inpatient Hospital Stay (HOSPITAL_COMMUNITY): Payer: Medicare Other

## 2011-01-30 DIAGNOSIS — R079 Chest pain, unspecified: Secondary | ICD-10-CM

## 2011-01-30 DIAGNOSIS — F10231 Alcohol dependence with withdrawal delirium: Secondary | ICD-10-CM

## 2011-01-30 LAB — CBC
HCT: 28.6 % — ABNORMAL LOW (ref 39.0–52.0)
Hemoglobin: 9.7 g/dL — ABNORMAL LOW (ref 13.0–17.0)
Hemoglobin: 10.5 g/dL — ABNORMAL LOW (ref 13.0–17.0)
MCH: 32.2 pg (ref 26.0–34.0)
MCV: 95 fL (ref 78.0–100.0)
MCV: 94.2 fL (ref 78.0–100.0)
RBC: 3.01 MIL/uL — ABNORMAL LOW (ref 4.22–5.81)
RBC: 3.26 MIL/uL — ABNORMAL LOW (ref 4.22–5.81)
WBC: 1.7 10*3/uL — ABNORMAL LOW (ref 4.0–10.5)
WBC: 2.2 10*3/uL — ABNORMAL LOW (ref 4.0–10.5)

## 2011-01-30 LAB — PHOSPHORUS: Phosphorus: 3.4 mg/dL (ref 2.3–4.6)

## 2011-01-30 LAB — COMPREHENSIVE METABOLIC PANEL
Alkaline Phosphatase: 58 U/L (ref 39–117)
BUN: 38 mg/dL — ABNORMAL HIGH (ref 6–23)
CO2: 25 mEq/L (ref 19–32)
Chloride: 100 mEq/L (ref 96–112)
Creatinine, Ser: 5.52 mg/dL — ABNORMAL HIGH (ref 0.50–1.35)
GFR calc non Af Amer: 11 mL/min — ABNORMAL LOW (ref >60)
Total Bilirubin: 0.5 mg/dL (ref 0.3–1.2)

## 2011-01-30 LAB — HIV ANTIBODY (ROUTINE TESTING W REFLEX): HIV: NONREACTIVE

## 2011-01-30 LAB — ERYTHROPOIETIN: Erythropoietin: 387 m[IU]/mL — ABNORMAL HIGH (ref 2.6–34.0)

## 2011-01-31 LAB — DIFFERENTIAL
Basophils Absolute: 0 10*3/uL (ref 0.0–0.1)
Eosinophils Relative: 3 % (ref 0–5)
Lymphocytes Relative: 54 % — ABNORMAL HIGH (ref 12–46)
Lymphs Abs: 1.1 10*3/uL (ref 0.7–4.0)
Monocytes Relative: 12 % (ref 3–12)

## 2011-01-31 LAB — CBC
HCT: 26.4 % — ABNORMAL LOW (ref 39.0–52.0)
Hemoglobin: 8.8 g/dL — ABNORMAL LOW (ref 13.0–17.0)
MCV: 97.1 fL (ref 78.0–100.0)
RDW: 19 % — ABNORMAL HIGH (ref 11.5–15.5)
WBC: 2 10*3/uL — ABNORMAL LOW (ref 4.0–10.5)

## 2011-01-31 LAB — HEPARIN INDUCED THROMBOCYTOPENIA PNL
Heparin Induced Plt Ab: NEGATIVE
Patient O.D.: 0.258
UFH High Dose UFH H: 0 % Release
UFH Low Dose 0.1 IU/mL: 0 % Release
UFH Low Dose 0.5 IU/mL: 0 % Release
UFH SRA Result: NEGATIVE

## 2011-01-31 LAB — RENAL FUNCTION PANEL
BUN: 14 mg/dL (ref 6–23)
CO2: 26 mEq/L (ref 19–32)
Chloride: 102 mEq/L (ref 96–112)
Creatinine, Ser: 4.01 mg/dL — ABNORMAL HIGH (ref 0.50–1.35)
Glucose, Bld: 113 mg/dL — ABNORMAL HIGH (ref 70–99)
Potassium: 3.5 mEq/L (ref 3.5–5.1)

## 2011-01-31 LAB — PREPARE PLATELET PHERESIS

## 2011-02-01 ENCOUNTER — Inpatient Hospital Stay (HOSPITAL_COMMUNITY): Payer: Medicare Other

## 2011-02-01 ENCOUNTER — Encounter: Payer: Self-pay | Admitting: Internal Medicine

## 2011-02-01 DIAGNOSIS — R079 Chest pain, unspecified: Secondary | ICD-10-CM

## 2011-02-01 DIAGNOSIS — F10231 Alcohol dependence with withdrawal delirium: Secondary | ICD-10-CM

## 2011-02-01 LAB — RENAL FUNCTION PANEL
BUN: 27 mg/dL — ABNORMAL HIGH (ref 6–23)
CO2: 26 mEq/L (ref 19–32)
Chloride: 100 mEq/L (ref 96–112)
GFR calc Af Amer: 12 mL/min — ABNORMAL LOW (ref >60)
Glucose, Bld: 104 mg/dL — ABNORMAL HIGH (ref 70–99)
Phosphorus: 2.9 mg/dL (ref 2.3–4.6)
Potassium: 3.7 mEq/L (ref 3.5–5.1)
Sodium: 136 mEq/L (ref 135–145)

## 2011-02-01 LAB — DIFFERENTIAL
Basophils Absolute: 0 10*3/uL (ref 0.0–0.1)
Eosinophils Absolute: 0.1 10*3/uL (ref 0.0–0.7)
Lymphocytes Relative: 48 % — ABNORMAL HIGH (ref 12–46)
Monocytes Relative: 11 % (ref 3–12)
Neutro Abs: 1 10*3/uL — ABNORMAL LOW (ref 1.7–7.7)
Neutrophils Relative %: 38 % — ABNORMAL LOW (ref 43–77)

## 2011-02-01 LAB — CBC
HCT: 25.9 % — ABNORMAL LOW (ref 39.0–52.0)
Hemoglobin: 8.6 g/dL — ABNORMAL LOW (ref 13.0–17.0)
MCHC: 33.2 g/dL (ref 30.0–36.0)
RBC: 2.61 MIL/uL — ABNORMAL LOW (ref 4.22–5.81)
WBC: 2.5 10*3/uL — ABNORMAL LOW (ref 4.0–10.5)

## 2011-02-01 MED ORDER — ONDANSETRON HCL 4 MG PO TABS: 4.0000 mg | ORAL_TABLET | Freq: Three times a day (TID) | ORAL | Status: DC | PRN

## 2011-02-01 NOTE — Progress Notes (Signed)
Patient was admitted for acute on chronic pancreatitis and alcohol withdrawal.  Symptoms resolved after NPO and CIWA protocol.  Patient faked his tremors at times, only in the presence of physicians.  Patient was not given any narcotics given his narcotic abuse history.  Will be discharged home with Zofran to help control his nausea.  Patient was instructed to call IM clinic to schedule follow up appt in 1-2 weeks.

## 2011-02-02 IMAGING — CR DG ABDOMEN ACUTE W/ 1V CHEST
2 series · 2 of 2 positions shown · non-contrast
Comparison: 06/13/2010

CLINICAL DATA: Abdominal pain  and vomiting.

ACUTE ABDOMEN SERIES (ABDOMEN 2 VIEW & CHEST 1 VIEW)

[view not recorded (1 of 2)]
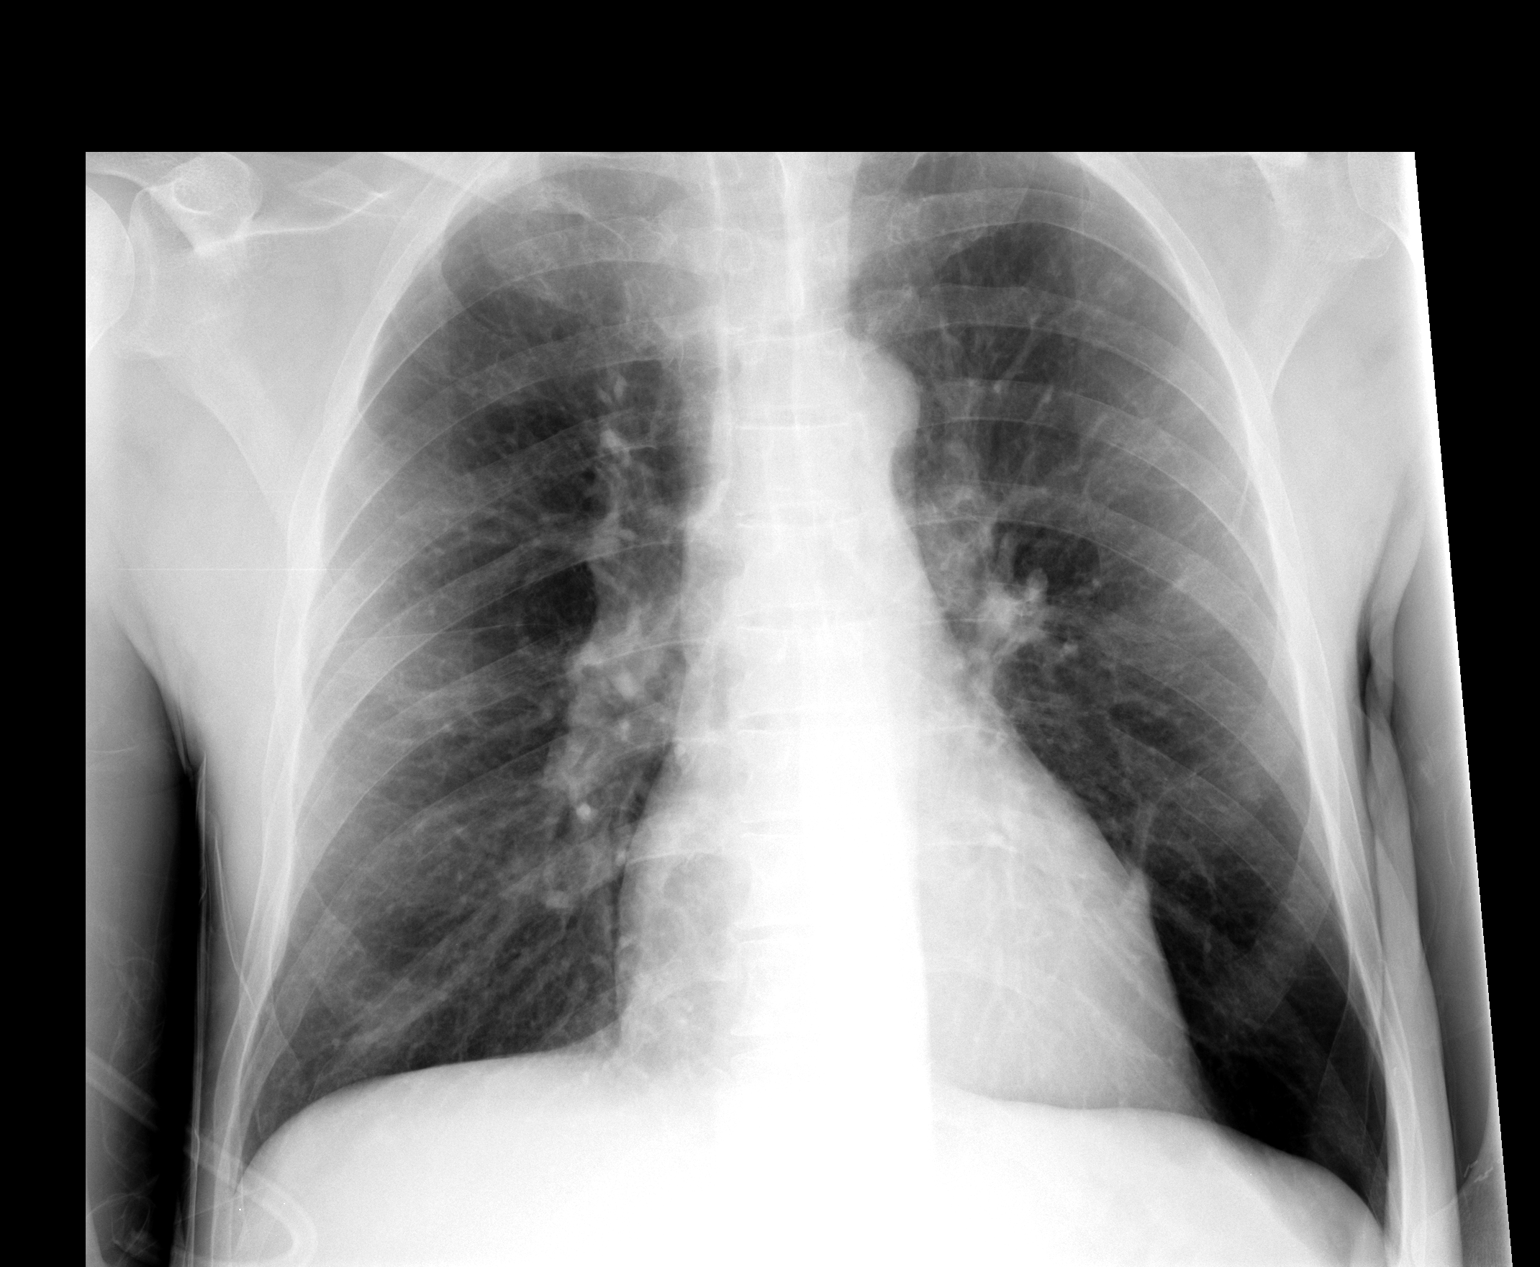

[view not recorded (2 of 2)]
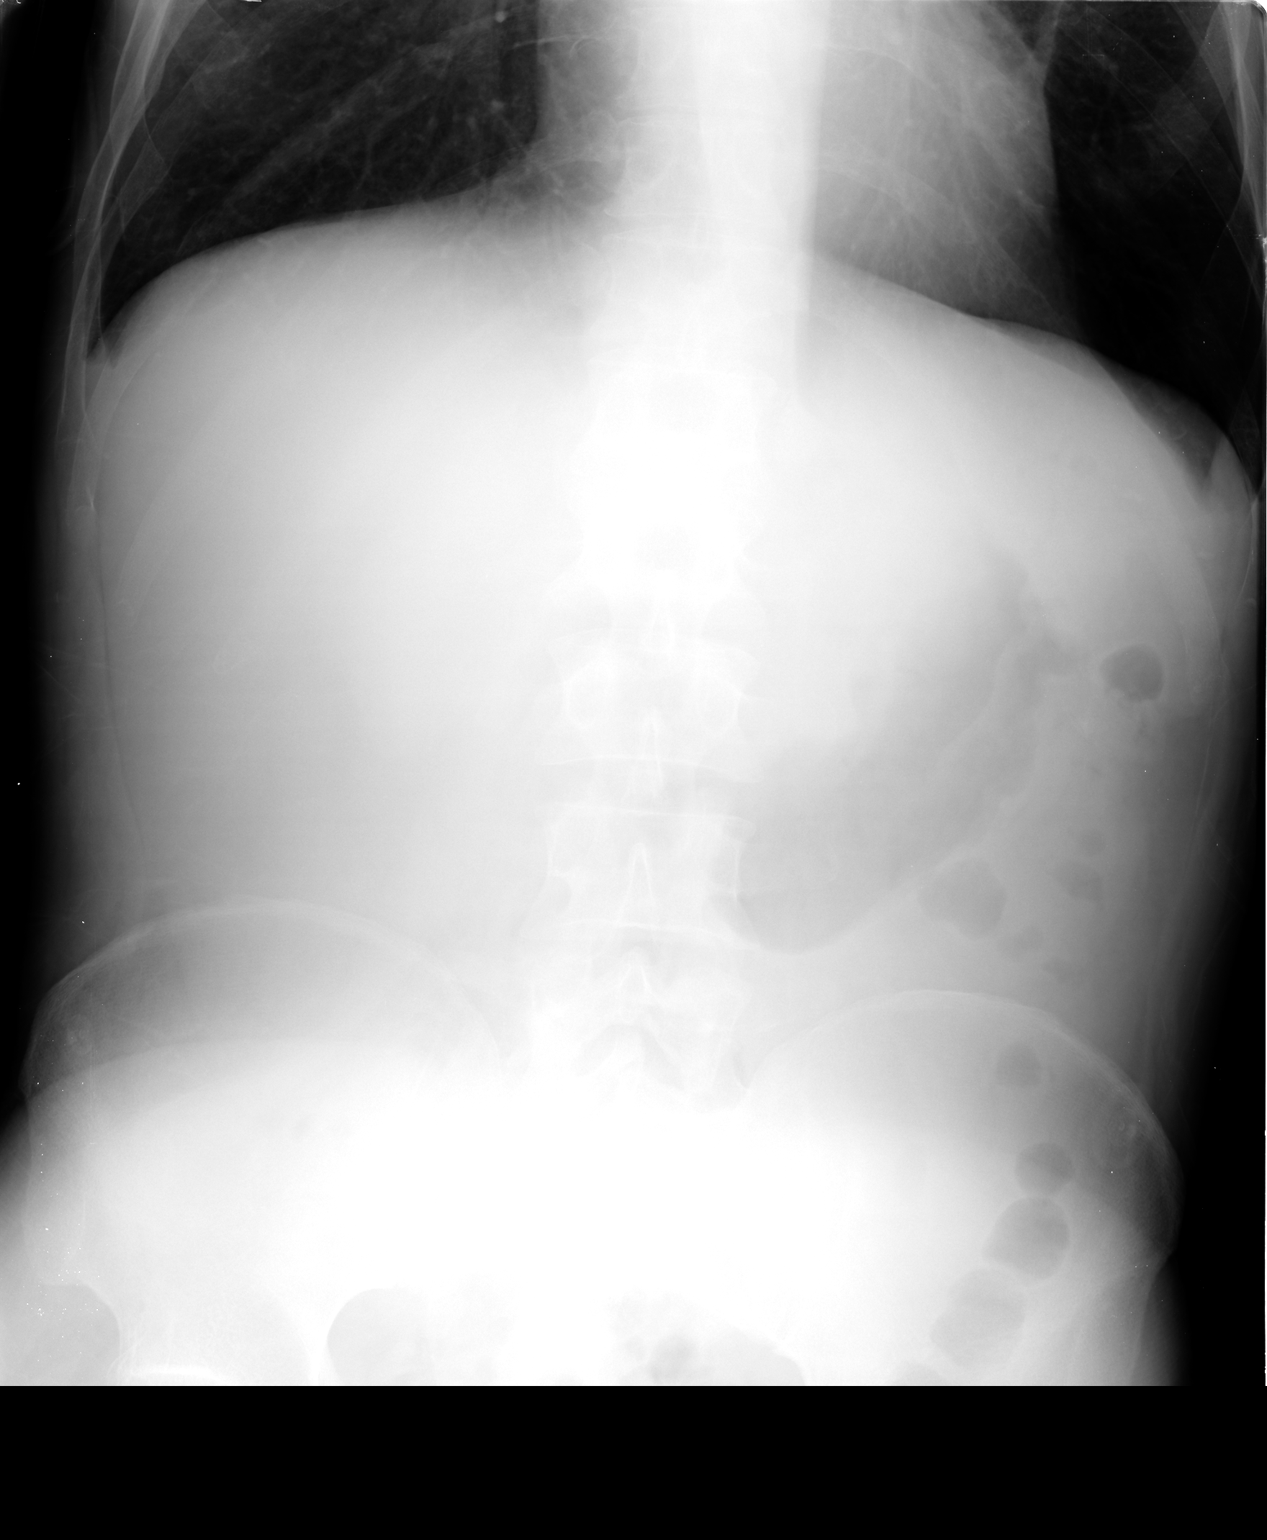

[2 of 2 positions shown; findings below may reference images not displayed]

FINDINGS: Heart size is normal.

No pleural effusion or pulmonary edema.

There is no airspace consolidation.  The bowel gas pattern appears
nonobstructed.  There are no dilated loops of small bowel or air-
fluid levels identified.

There is gas identified within the colon.
IMPRESSION: 1.  Nonobstructive bowel gas pattern.

## 2011-02-04 NOTE — Consult Note (Signed)
[name], [name]               ACCOUNT NO.:  [id number]  MEDICAL RECORD NO.:  [id number]  LOCATION:  2604                         FACILITY:  [facility]  PHYSICIAN:  [name], M.D.   DATE OF BIRTH:  15-Jan-1956  DATE OF CONSULTATION:  01/29/2011 DATE OF DISCHARGE:                                CONSULTATION   [name] is a 55 year old white man admitted yesterday with nausea and abdominal pain.  He drinks about one fifth of Aristocrat vodka per day and stopped drinking 2 days prior to admission.  He got dialysis on June 25, but had missed 2 dialysis sessions prior to June 25 date.  Renal consult was requested by Dr. Rogelia [name].  PAST MEDICAL HISTORY: 1. End-stage renal disease secondary to ?  2. Hypertension 3  Positive hepatitis C 4. Heroin use - (though none in "10 years"). 5. Pancytopenia with past history of thrombocytopenia (hemoglobin 11.1     on January 16, 2011, WBC 4240 on Dec 26, 2010, platelet count 243,000     on Dec 26, 2010). 6. GI bleed (October 2011), "gastritis - EtOH induced." 7. Pancreatitis November 2007  8. Cellulitis, right clavicle (site of HD     cath), I&D by Dr. Dwain [name].  Dialysis Tuesday, Thursday, Saturday--[facility]--4 hours 2K 2.5 calcium, EDW 65.5 kg, EPO 10,000, Zemplar 5, PTH 362 (Dec 26, 2010), Fe/TIBC 44% (May 23).  Hemoglobin 11.3 January 16, 2011.  MEDS PRIOR TO ADMISSION: 1. Nephro-Vite 1 a day. 2. Pancrease 3 caps t.i.d. with meals. 3. Protonix 40 b.i.d. 4. Thiamine 100/D. 5. Ventolin 2 puffs q.i.d. 6. Spiriva 18 mcg/D. 7. Folic acid 1 mg/D.  CURRENT MEDS: 1. Clonidine 0.2 b.i.d. 2. Ativan p.r.n. 3. Protonix 40 IV/D. 4. Thiamine 100 IV/D. 5. Folic acid 1 mg/D IV.  ALLERGIES:  None known.  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 Drinks 1 fifth of vodka per day, 40 pack-year history of cigarettes, married for 25  years, divorced, lives with stepson, no heroin in 10 years.  FAMILY HISTORY:  Father died at age 25 "fell and broke neck."  Mother died 6 (CVA plus fall).  Two brothers are healthy.  One daughter [name]) healthy.  REVIEW OF SYSTEMS:  No angina, no claudication.  No melena, no hematochezia.  No gross hematuria.  No renal colic.  No DOE, no orthopnea.  No purulent sputum.  No hemoptysis.  No cold or heat intolerance.  PHYSICAL EXAMINATION:  GENERAL:  He is awake, tremulous, oriented x3, answers questions appropriately. VITAL SIGNS:  Temperature 99, pulse 90, respirations 16, blood pressure 160/90. HEENT:  Mouth:  No teeth.  Mucous membranes are moist. CHEST:  Clear. HEART:  No rub. ABDOMEN:  Tender in epigastrium.  No rebound. GU:  Circumcised penis, testes descended bilaterally. EXTREMITIES:  AVG, left upper arm patent, no edema. NEURO:  Right-handed, "strength equal," tremulous.  LABORATORY DATA:  Hemoglobin 9.7, WBC 1700, PLT 17,000 (platelets 243K on May 23 and WBC 4240 on Dec 26, 2010).  Sodium 137, potassium 3.3, chloride 99, CO2 23, BUN 35, CR 4.2, calcium 8.7, phosphorus  5.4, glucose 81, amylase 115, lipase 168, albumin 3, SGOT 59, SGPT 35, bilirubin total 0.8.    Chest x-ray clear.   Abdominal films showed nonobstructive bowel gas pattern.   CT abdomen and pelvis shows ascites, fatty liver, right base atelectasis.  IMPRESSION: 1. End-stage renal disease on dialysis Monday, Wednesday, Friday. 2. Pancytopenia (new). 3. EtOH with probable withdrawal. 4. Abdominal pain with elevated amylase plus ascites. 5. Secondary PTH. 6. Hypertension.  PLAN: 1. Dialyze in the morning, weight is 64 kg, no heparin. 2. Follow iron stores, were adequate (Fe/TIBC 44% on Dec 27, 2010).     DIC panel ordered.  Continue EPO. 3. EtOH/withdrawal.  Ativan p.r.n. 4. N.p.o., no tap of ascites secondary to thrombocytopenia. 5. Continue Zemplar, follow phosphorus. 6. Continue meds,  observe.     [name], M.D.     RFF/MEDQ  D:  01/29/2011  T:  01/30/2011  Job:  [id number]  Electronically Signed by Marina Gravel M.D. on 02/04/2011 04:32:49 PM

## 2011-02-13 ENCOUNTER — Inpatient Hospital Stay (HOSPITAL_COMMUNITY)
Admission: EM | Admit: 2011-02-13 | Discharge: 2011-02-21 | DRG: 438 | Disposition: A | Discharge status: Home / Self Care | Payer: Medicare Other | Attending: Internal Medicine | Admitting: Internal Medicine

## 2011-02-13 DIAGNOSIS — F10931 Alcohol use, unspecified with withdrawal delirium: Secondary | ICD-10-CM | POA: Diagnosis not present

## 2011-02-13 DIAGNOSIS — E875 Hyperkalemia: Secondary | ICD-10-CM | POA: Diagnosis present

## 2011-02-13 DIAGNOSIS — J449 Chronic obstructive pulmonary disease, unspecified: Secondary | ICD-10-CM | POA: Diagnosis present

## 2011-02-13 DIAGNOSIS — Z91158 Patient's noncompliance with renal dialysis for other reason: Secondary | ICD-10-CM

## 2011-02-13 DIAGNOSIS — F10229 Alcohol dependence with intoxication, unspecified: Secondary | ICD-10-CM | POA: Diagnosis present

## 2011-02-13 DIAGNOSIS — J4489 Other specified chronic obstructive pulmonary disease: Secondary | ICD-10-CM | POA: Diagnosis present

## 2011-02-13 DIAGNOSIS — K209 Esophagitis, unspecified without bleeding: Secondary | ICD-10-CM | POA: Diagnosis present

## 2011-02-13 DIAGNOSIS — D61818 Other pancytopenia: Secondary | ICD-10-CM | POA: Diagnosis not present

## 2011-02-13 DIAGNOSIS — E46 Unspecified protein-calorie malnutrition: Secondary | ICD-10-CM | POA: Diagnosis present

## 2011-02-13 DIAGNOSIS — Z9119 Patient's noncompliance with other medical treatment and regimen: Secondary | ICD-10-CM

## 2011-02-13 DIAGNOSIS — N186 End stage renal disease: Secondary | ICD-10-CM | POA: Diagnosis present

## 2011-02-13 DIAGNOSIS — K319 Disease of stomach and duodenum, unspecified: Secondary | ICD-10-CM | POA: Diagnosis present

## 2011-02-13 DIAGNOSIS — I120 Hypertensive chronic kidney disease with stage 5 chronic kidney disease or end stage renal disease: Secondary | ICD-10-CM | POA: Diagnosis present

## 2011-02-13 DIAGNOSIS — Z91199 Patient's noncompliance with other medical treatment and regimen due to unspecified reason: Secondary | ICD-10-CM

## 2011-02-13 DIAGNOSIS — N251 Nephrogenic diabetes insipidus: Secondary | ICD-10-CM | POA: Diagnosis present

## 2011-02-13 DIAGNOSIS — E872 Acidosis, unspecified: Secondary | ICD-10-CM | POA: Diagnosis present

## 2011-02-13 DIAGNOSIS — F10231 Alcohol dependence with withdrawal delirium: Secondary | ICD-10-CM | POA: Diagnosis not present

## 2011-02-13 DIAGNOSIS — K859 Acute pancreatitis without necrosis or infection, unspecified: Principal | ICD-10-CM | POA: Diagnosis present

## 2011-02-13 DIAGNOSIS — Z992 Dependence on renal dialysis: Secondary | ICD-10-CM

## 2011-02-13 DIAGNOSIS — K861 Other chronic pancreatitis: Secondary | ICD-10-CM | POA: Diagnosis present

## 2011-02-13 DIAGNOSIS — Z9115 Patient's noncompliance with renal dialysis: Secondary | ICD-10-CM

## 2011-02-13 DIAGNOSIS — E873 Alkalosis: Secondary | ICD-10-CM | POA: Diagnosis present

## 2011-02-13 DIAGNOSIS — K2980 Duodenitis without bleeding: Secondary | ICD-10-CM | POA: Diagnosis present

## 2011-02-13 DIAGNOSIS — K2970 Gastritis, unspecified, without bleeding: Secondary | ICD-10-CM | POA: Diagnosis present

## 2011-02-13 DIAGNOSIS — B1920 Unspecified viral hepatitis C without hepatic coma: Secondary | ICD-10-CM | POA: Diagnosis present

## 2011-02-13 LAB — DIFFERENTIAL
Basophils Absolute: 0 10*3/uL (ref 0.0–0.1)
Basophils Relative: 0 % (ref 0–1)
Eosinophils Absolute: 0 K/uL (ref 0.0–0.7)
Eosinophils Relative: 0 % (ref 0–5)
Lymphocytes Relative: 19 % (ref 12–46)
Lymphs Abs: 1.2 K/uL (ref 0.7–4.0)
Monocytes Absolute: 0.1 10*3/uL (ref 0.1–1.0)
Monocytes Relative: 2 % — ABNORMAL LOW (ref 3–12)
Neutro Abs: 4.8 K/uL (ref 1.7–7.7)
Neutrophils Relative %: 78 % — ABNORMAL HIGH (ref 43–77)

## 2011-02-13 LAB — COMPREHENSIVE METABOLIC PANEL WITH GFR
ALT: 89 U/L — ABNORMAL HIGH (ref 0–53)
Albumin: 2.9 g/dL — ABNORMAL LOW (ref 3.5–5.2)
Alkaline Phosphatase: 82 U/L (ref 39–117)
Chloride: 93 meq/L — ABNORMAL LOW (ref 96–112)
Potassium: 6 meq/L — ABNORMAL HIGH (ref 3.5–5.1)
Sodium: 136 meq/L (ref 135–145)
Total Protein: 7.4 g/dL (ref 6.0–8.3)

## 2011-02-13 LAB — CBC
HCT: 26.3 % — ABNORMAL LOW (ref 39.0–52.0)
Hemoglobin: 8.9 g/dL — ABNORMAL LOW (ref 13.0–17.0)
MCH: 32.4 pg (ref 26.0–34.0)
MCHC: 33.8 g/dL (ref 30.0–36.0)
MCV: 95.6 fL (ref 78.0–100.0)
Platelets: 357 K/uL (ref 150–400)
RBC: 2.75 MIL/uL — ABNORMAL LOW (ref 4.22–5.81)
RDW: 18.4 % — ABNORMAL HIGH (ref 11.5–15.5)
WBC: 6.1 K/uL (ref 4.0–10.5)

## 2011-02-13 LAB — URINALYSIS, ROUTINE W REFLEX MICROSCOPIC
Bilirubin Urine: NEGATIVE
Glucose, UA: NEGATIVE mg/dL
Ketones, ur: NEGATIVE mg/dL
Leukocytes, UA: NEGATIVE
Nitrite: NEGATIVE
Protein, ur: >300 mg/dL — AB
Specific Gravity, Urine: 1.015 (ref 1.005–1.030)
Urobilinogen, UA: 0.2 mg/dL (ref 0.0–1.0)
pH: 6.5 (ref 5.0–8.0)

## 2011-02-13 LAB — COMPREHENSIVE METABOLIC PANEL
AST: 131 U/L — ABNORMAL HIGH (ref 0–37)
BUN: 123 mg/dL — ABNORMAL HIGH (ref 6–23)
CO2: 16 mEq/L — ABNORMAL LOW (ref 19–32)
Calcium: 6.4 mg/dL — CL (ref 8.4–10.5)
Creatinine, Ser: 9.32 mg/dL — ABNORMAL HIGH (ref 0.50–1.35)
GFR calc Af Amer: 7 mL/min — ABNORMAL LOW (ref >60)
GFR calc non Af Amer: 6 mL/min — ABNORMAL LOW (ref >60)
Glucose, Bld: 86 mg/dL (ref 70–99)
Total Bilirubin: 0.5 mg/dL (ref 0.3–1.2)

## 2011-02-13 LAB — PROTIME-INR
INR: 1.09 (ref 0.00–1.49)
Prothrombin Time: 14.3 s (ref 11.6–15.2)

## 2011-02-13 LAB — ETHANOL: Alcohol, Ethyl (B): <11 mg/dL (ref 0–11)

## 2011-02-13 LAB — URINE MICROSCOPIC-ADD ON

## 2011-02-13 LAB — LIPASE, BLOOD: Lipase: 98 U/L — ABNORMAL HIGH (ref 11–59)

## 2011-02-14 ENCOUNTER — Encounter: Payer: Self-pay | Admitting: Internal Medicine

## 2011-02-14 ENCOUNTER — Emergency Department (HOSPITAL_COMMUNITY): Payer: Medicare Other

## 2011-02-14 ENCOUNTER — Inpatient Hospital Stay (HOSPITAL_COMMUNITY): Payer: Medicare Other

## 2011-02-14 ENCOUNTER — Encounter: Payer: Medicare Other | Admitting: Internal Medicine

## 2011-02-14 DIAGNOSIS — K859 Acute pancreatitis without necrosis or infection, unspecified: Secondary | ICD-10-CM

## 2011-02-14 DIAGNOSIS — F1020 Alcohol dependence, uncomplicated: Secondary | ICD-10-CM

## 2011-02-14 DIAGNOSIS — F10239 Alcohol dependence with withdrawal, unspecified: Secondary | ICD-10-CM

## 2011-02-14 LAB — BASIC METABOLIC PANEL
BUN: 100 mg/dL — ABNORMAL HIGH (ref 6–23)
BUN: 126 mg/dL — ABNORMAL HIGH (ref 6–23)
CO2: 18 mEq/L — ABNORMAL LOW (ref 19–32)
Chloride: 110 mEq/L (ref 96–112)
GFR calc Af Amer: 10 mL/min — ABNORMAL LOW (ref >60)
GFR calc non Af Amer: 9 mL/min — ABNORMAL LOW (ref >60)
GFR calc non Af Amer: 6 mL/min — ABNORMAL LOW (ref >60)
Glucose, Bld: 67 mg/dL — ABNORMAL LOW (ref 70–99)
Glucose, Bld: 89 mg/dL (ref 70–99)
Potassium: 3.1 mEq/L — ABNORMAL LOW (ref 3.5–5.1)
Potassium: 3.9 mEq/L (ref 3.5–5.1)
Sodium: 143 mEq/L (ref 135–145)
Sodium: 140 mEq/L (ref 135–145)

## 2011-02-14 LAB — POCT I-STAT, CHEM 8
Calcium, Ion: 0.73 mmol/L — CL (ref 1.12–1.32)
Chloride: 106 mEq/L (ref 96–112)
Glucose, Bld: 82 mg/dL (ref 70–99)
HCT: 27 % — ABNORMAL LOW (ref 39.0–52.0)
Hemoglobin: 9.2 g/dL — ABNORMAL LOW (ref 13.0–17.0)
Sodium: 136 mEq/L (ref 135–145)

## 2011-02-14 LAB — CBC
HCT: 22.5 % — ABNORMAL LOW (ref 39.0–52.0)
Hemoglobin: 7.7 g/dL — ABNORMAL LOW (ref 13.0–17.0)
Hemoglobin: 7.8 g/dL — ABNORMAL LOW (ref 13.0–17.0)
MCH: 32.8 pg (ref 26.0–34.0)
MCH: 32.6 pg (ref 26.0–34.0)
MCHC: 34.2 g/dL (ref 30.0–36.0)
MCHC: 34.5 g/dL (ref 30.0–36.0)
MCV: 94.6 fL (ref 78.0–100.0)
RBC: 2.35 MIL/uL — ABNORMAL LOW (ref 4.22–5.81)
RBC: 2.39 MIL/uL — ABNORMAL LOW (ref 4.22–5.81)

## 2011-02-14 NOTE — H&P (Signed)
Hospital Admission Note Date: 02/14/2011  Patient name: [name] Medical record number: [id number] Date of birth: 1956/03/24 Age: 55 y.o. Gender: male PCP: Melida Quitter, MD  Medical Service: Internal Medicine Teaching Service B-2  Attending physician:  Dr. Ulyess Mort Resident [phone]): Dr. Denton Meek   Pager: [phone] Acting Intern (MSIV): Wendall Papa   Pager: [phone]  Chief Complaint: Abdominal pain, N/V, Alcohol withdrawal  History of Present Illness:This is a 55 year old gentleman with past medical history significant for chronic alcohol intoxication, end-stage renal disease on hemodialysis, hepatitis C, and chronic alcoholic pancreatitis with multiple admissions for acute exacerbation who presented to [facility] ED with main concern of generalized abdominal pain, worse on left, radiating to his back, constant, 10/10 in severity with no specific aggravating or alleviating factors.   He states that he has had nausea and vomiting with watery diarrhea for the last day.  He states that he thinks he has vomited at least 30 times.  He states that his vomit has had black flecks in it and that the diarrhea has been mostly water with green and black flecks in it over the last day as well.  He states that this is his typical pancreatitis flare.  He states that for the last few weeks he has been drinking two 5ths of the cheapest vodka that he could find up until yesterday evening.  He denies any other drugs.  He states that with his nausea and vomiting he hasn't been eating any solid foods for the last day or so.  He denies chest pain, shortness of breath, fever, chills, dizziness on standing, or other systemic symptoms.  He also states that he last received dialysis on Friday July 6th.    Current Outpatient Prescriptions  Medication Sig Dispense Refill  . albuterol (VENTOLIN HFA) 108 (90 BASE) MCG/ACT inhaler Inhale 2 puffs into the lungs every 6 (six) hours as needed.        Marland Kitchen  amylase-lipase-protease (LIPRAM-CR10) 33.09-15-35.5 MU per capsule Take 3 capsules by mouth 3 (three) times daily with meals.        . B Complex-C-Folic Acid (RENA-VITE RX) 1 MG TABS Take 1 tablet by mouth daily.        . calcium acetate (PHOSLO) 667 MG capsule Take 667 mg by mouth 3 (three) times daily with meals.        . folic acid (FOLVITE) 1 MG tablet Take 1 mg by mouth daily.        Marland Kitchen gabapentin (NEURONTIN) 100 MG capsule Take 1 capsule (100 mg total) by mouth 3 (three) times daily.  90 capsule  2  . Iron Sucrose (VENOFER IV) Inject into the vein.        Marland Kitchen ondansetron (ZOFRAN) 4 MG tablet Take 1 tablet (4 mg total) by mouth every 8 (eight) hours as needed for nausea.  30 tablet  0  . pantoprazole (PROTONIX) 40 MG tablet Take 1 tablet (40 mg total) by mouth daily.  30 tablet  1  . thiamine 100 MG tablet Take 100 mg by mouth daily.        Marland Kitchen tiotropium (SPIRIVA) 18 MCG inhalation capsule Place 18 mcg into inhaler and inhale daily.         Allergies: Review of patient's allergies indicates no known allergies.  Past Medical History  Diagnosis Date  . Pancytopenia      chronic  . Polysubstance abuse     chronic most notable for alcohol  . End-stage renal disease on hemodialysis   .  Malignant hypertension   . Hepatitis C   . COPD (chronic obstructive pulmonary disease)   . Chronic recurrent pancreatitis     likely secondary to alcoholism  . Smoker   . Alcohol abuse   . Respiratory failure Jan 2012    Hx of VDRF   . Alcohol withdrawal   . Pancreatitis, alcoholic   . Renal insufficiency    No past surgical history on file.  Family History  Problem Relation Age of Onset  . Hypertension Mother   . Alcohol abuse    . Anxiety disorder    . Hyperlipidemia    . Stroke     History   Social History  . Marital Status: Divorced    Spouse Name: N/A    Number of Children: N/A  . Years of Education: N/A   Occupational History  . Not on file.   Social History Main Topics  .  Smoking status: Current Everyday Smoker -- 0.5 packs/day for 15 years    Types: Cigarettes  . Smokeless tobacco: Not on file  . Alcohol Use: Yes  . Drug Use: Yes  . Sexually Active: Not on file         Other Topics Concern  . Not on file   Social History Narrative    unemployed divorced , used to drink one bottle of whiskey for years. States he is currently drinking 2 5ths of the cheapest vodka they have.   Review of Systems: ROS negative except as noted in the HPI.  Physical Exam: Vitals: Tm: 98.5  P: 104 --> 80  BP: 176/108 --> 123/83  Resp: 16  O2 Sat 100% on RA Constitutional: Vital signs reviewed.  Patient is a thin man who is appears older then his stated age.  He is in mild distress from pain and markedly tremulous.  He cooperative with exam. Alert and oriented x3.  Head: Atraumatic Mouth: no erythema or exudates, MMM Eyes: PERRL, EOMI, conjunctivae normal, No scleral icterus.  Neck: Supple, normal ROM, No JVD, mass, thyromegaly, or carotid bruit present.  Cardiovascular: Tachycardic rate, regular rhythm, S1 normal, S2 normal, no MRG, pulses symmetric and intact bilaterally Pulmonary/Chest: CTAB except for occasional expiratory wheezes in the bilateral bases.  No rales, or rhonchi Abdominal: Soft. Mildly tender diffusely, Mildly distended, bowel sounds are normal.  Moderate guarding on exam.  no masses or organomegaly present.  GU: no CVA tenderness Musculoskeletal: No joint deformities, erythema, or stiffness, ROM full and no nontender Neurological: A&O x3, Strength is normal and symmetric bilaterally, cranial nerve II-XII are grossly intact, no focal motor deficit, sensory intact to light touch bilaterally. There is a marked tremor that disappears when the patient is distracted.  Skin: Warm, dry and intact. No rash, cyanosis, or clubbing.  Psychiatric: Markedly anxious mood and tearful affect. speech is pressured and behavior is bizarre. Judgment and insight are poor.  Thought  content normal. Cognition and memory appear normal.   Lab results: Admission on 02/13/2011  Component Value Range  . Neutrophils Relative (%) 78* 43-77  . Neutro Abs (K/uL) 4.8  1.7-7.7  . Lymphocytes Relative (%) 19  12-46  . Lymphs Abs (K/uL) 1.2  0.7-4.0  . Monocytes Relative (%) 2* 3-12  . Monocytes Absolute (K/uL) 0.1  0.1-1.0  . Eosinophils Relative (%) 0  0-5  . Eosinophils Absolute (K/uL) 0.0  0.0-0.7  . Basophils Relative (%) 0  0-1  . Basophils Absolute (K/uL) 0.0  0.0-0.1       .  WBC (K/uL) 6.1  4.0-10.5  . RBC (MIL/uL) 2.75* 4.22-5.81  . Hemoglobin (g/dL) 8.9* 16.1-09.6  . HCT (%) 26.3* 39.0-52.0  . MCV (fL) 95.6  78.0-100.0  . MCH (pg) 32.4  26.0-34.0  . MCHC (g/dL) 04.5  40.9-81.1  . RDW (%) 18.4* 11.5-15.5  . Platelets (K/uL) 357  150-400       . Prothrombin Time (seconds) 14.3  11.6-15.2  . INR  1.09  0.00-1.49       . Sodium (mEq/L) 136  135-145  . Potassium (mEq/L) 6.0* 3.5-5.1  . Chloride (mEq/L) 93* 96-112  . CO2 (mEq/L) 16* 19-32  . Glucose, Bld (mg/dL) 86  91-47  . BUN (mg/dL) 829* 5-62  . Creatinine, Ser (mg/dL) 1.30* 8.65-7.84  . Calcium (mg/dL) 6.4* 6.9-62.9  . Total Protein (g/dL) 7.4  5.2-8.4  . Albumin (g/dL) 2.9* 1.3-2.4  . AST (U/L) 131* 0-37  . ALT (U/L) 89* 0-53  . Alkaline Phosphatase (U/L) 82  39-117  . Total Bilirubin (mg/dL) 0.5  4.0-1.0  . GFR calc non Af Amer (mL/min) 6* >60  . GFR calc Af Amer (mL/min) 7* >60       . Alcohol, Ethyl (B) (mg/dL) <27  2-53  . Lipase (U/L) 98* 11-59       . Color, Urine  YELLOW  YELLOW  . Appearance  CLEAR  CLEAR  . Specific Gravity, Urine  1.015  1.005-1.030  . pH  6.5  5.0-8.0  . Glucose, UA (mg/dL) NEGATIVE  NEGATIVE  . Hgb urine dipstick  TRACE* NEGATIVE  . Bilirubin Urine  NEGATIVE  NEGATIVE  . Ketones (mg/dL) NEGATIVE  NEGATIVE  . Protein (mg/dL) >664* NEGATIVE  . Urobilinogen, UA (mg/dL) 0.2  4.0-3.4  . Nitrite  NEGATIVE  NEGATIVE  . Leukocytes, UA  NEGATIVE  NEGATIVE  . WBC, UA  (WBC/hpf) 0-2  <3       . Magnesium (mg/dL) 1.8  7.4-2.5  . Phosphorus (mg/dL) 95.6* 3.8-7.5  . Calcium (mg/dL) 6.3* 6.4-33.2   Imaging results:  None  Other results: EKG: Sinus Tachycardia 90 BPM, Peaked T waves diffusely, poor R wave progression  Assessment & Plan by Problem: 1. Abdominal pain - likely secondary to acute pancreatitis in the setting of alcohol intoxication with elevated lipase and physical exam findings of significant tenderness on the left side of the abdomen. Other differential diagnosis included peptic ulcer disease, alcohol-induced gastritis with noncompliance, cholecystitis, cholelithiasis.  FOBT performed in the ER positive.   PLAN:   - admit patient to step down and monitor vitals  - NPO  - supportive care with IV fluids, Phenergan IV for nausea,   pain control with low dose morphine  - Protonix BID   2. Alcohol withdrawal - pt EtOH elvels not detectable but pt reports daily drinking   PLAN:   - start on CIWA protocol   - Ativan, Thiamine, Folic acid per CIWA    3. Hx of Pancytopenia- thought to be secondary to alcohol induced bone marrow suppression.  With his now normal WBC count and normal platelet count there is mild concern for infection but with no documented fever, lack of hypotension, and no localizing signs it is unlikely  - obtain cbc in AM   - Hg/Hct at baseline   4. ESRD -several skipped dialysis and hypokalemia on admission.  He received Kayexelate, insulin, bicarb, and calcium gluconate in the ER.  He also has hypocalcemia.  Dr. Charlaine Dalton notified and will arrange for dialysis in the AM.  PLAN:   - renal panel in the AM    5. DVT prophylaxis - SCDs      R2/3______________________________      R1________________________________  ATTENDING: I performed and/or observed a history and physical examination of the patient.  I discussed the case with the residents as noted and reviewed the residents' notes.  I agree with the findings and  plan--please refer to the attending physician note for more details.  Signature________________________________  Printed Name_____________________________

## 2011-02-15 LAB — CBC
HCT: 23.6 % — ABNORMAL LOW (ref 39.0–52.0)
Hemoglobin: 7.8 g/dL — ABNORMAL LOW (ref 13.0–17.0)
MCV: 96.7 fL (ref 78.0–100.0)
RBC: 2.44 MIL/uL — ABNORMAL LOW (ref 4.22–5.81)
RDW: 18.2 % — ABNORMAL HIGH (ref 11.5–15.5)
WBC: 3.8 10*3/uL — ABNORMAL LOW (ref 4.0–10.5)

## 2011-02-15 LAB — RENAL FUNCTION PANEL
BUN: 34 mg/dL — ABNORMAL HIGH (ref 6–23)
CO2: 28 mEq/L (ref 19–32)
GFR calc Af Amer: 16 mL/min — ABNORMAL LOW (ref >60)
Glucose, Bld: 97 mg/dL (ref 70–99)
Potassium: 3.5 mEq/L (ref 3.5–5.1)
Sodium: 137 mEq/L (ref 135–145)

## 2011-02-15 LAB — LIPASE, BLOOD: Lipase: 50 U/L (ref 11–59)

## 2011-02-15 LAB — IRON AND TIBC
Iron: 92 ug/dL (ref 42–135)
Saturation Ratios: 48 % (ref 20–55)
TIBC: 190 ug/dL — ABNORMAL LOW (ref 215–435)

## 2011-02-16 ENCOUNTER — Inpatient Hospital Stay (HOSPITAL_COMMUNITY): Payer: Medicare Other

## 2011-02-16 LAB — BASIC METABOLIC PANEL
Calcium: 7.1 mg/dL — ABNORMAL LOW (ref 8.4–10.5)
Creatinine, Ser: 6.78 mg/dL — ABNORMAL HIGH (ref 0.50–1.35)
GFR calc Af Amer: 10 mL/min — ABNORMAL LOW (ref >60)
GFR calc non Af Amer: 8 mL/min — ABNORMAL LOW (ref >60)
Sodium: 135 mEq/L (ref 135–145)

## 2011-02-16 LAB — CBC
MCH: 32.9 pg (ref 26.0–34.0)
MCHC: 33.2 g/dL (ref 30.0–36.0)
MCV: 99.1 fL (ref 78.0–100.0)
Platelets: 127 10*3/uL — ABNORMAL LOW (ref 150–400)
RDW: 17.9 % — ABNORMAL HIGH (ref 11.5–15.5)

## 2011-02-17 DIAGNOSIS — D649 Anemia, unspecified: Secondary | ICD-10-CM

## 2011-02-17 DIAGNOSIS — R195 Other fecal abnormalities: Secondary | ICD-10-CM

## 2011-02-17 DIAGNOSIS — K861 Other chronic pancreatitis: Secondary | ICD-10-CM

## 2011-02-17 LAB — CBC
MCV: 99 fL (ref 78.0–100.0)
Platelets: 114 10*3/uL — ABNORMAL LOW (ref 150–400)
RDW: 17.7 % — ABNORMAL HIGH (ref 11.5–15.5)
WBC: 3.4 10*3/uL — ABNORMAL LOW (ref 4.0–10.5)

## 2011-02-17 LAB — TYPE AND SCREEN
ABO/RH(D): O NEG
Unit division: 0

## 2011-02-17 LAB — PROTIME-INR: Prothrombin Time: 13.1 seconds (ref 11.6–15.2)

## 2011-02-17 LAB — BILIRUBIN, FRACTIONATED(TOT/DIR/INDIR)
Bilirubin, Direct: 0.1 mg/dL (ref 0.0–0.3)
Indirect Bilirubin: 0.2 mg/dL — ABNORMAL LOW (ref 0.3–0.9)

## 2011-02-17 LAB — RETICULOCYTES
RBC.: 2.05 MIL/uL — ABNORMAL LOW (ref 4.22–5.81)
Retic Ct Pct: 3 % (ref 0.4–3.1)

## 2011-02-17 LAB — BASIC METABOLIC PANEL
Chloride: 97 mEq/L (ref 96–112)
Creatinine, Ser: 4.85 mg/dL — ABNORMAL HIGH (ref 0.50–1.35)
GFR calc Af Amer: 15 mL/min — ABNORMAL LOW (ref >60)
Sodium: 135 mEq/L (ref 135–145)

## 2011-02-18 ENCOUNTER — Inpatient Hospital Stay (HOSPITAL_COMMUNITY): Payer: Medicare Other

## 2011-02-18 DIAGNOSIS — K861 Other chronic pancreatitis: Secondary | ICD-10-CM

## 2011-02-18 DIAGNOSIS — K921 Melena: Secondary | ICD-10-CM

## 2011-02-18 DIAGNOSIS — D649 Anemia, unspecified: Secondary | ICD-10-CM

## 2011-02-18 DIAGNOSIS — R195 Other fecal abnormalities: Secondary | ICD-10-CM

## 2011-02-18 LAB — RENAL FUNCTION PANEL
CO2: 26 mEq/L (ref 19–32)
Calcium: 8.1 mg/dL — ABNORMAL LOW (ref 8.4–10.5)
Chloride: 99 mEq/L (ref 96–112)
GFR calc Af Amer: 11 mL/min — ABNORMAL LOW (ref >60)
GFR calc non Af Amer: 9 mL/min — ABNORMAL LOW (ref >60)
Glucose, Bld: 107 mg/dL — ABNORMAL HIGH (ref 70–99)
Potassium: 3.8 mEq/L (ref 3.5–5.1)
Sodium: 135 mEq/L (ref 135–145)

## 2011-02-18 LAB — HAPTOGLOBIN: Haptoglobin: 111 mg/dL (ref 30–200)

## 2011-02-18 LAB — CBC
HCT: 19.7 % — ABNORMAL LOW (ref 39.0–52.0)
MCHC: 33 g/dL (ref 30.0–36.0)
MCV: 99 fL (ref 78.0–100.0)
RDW: 17.9 % — ABNORMAL HIGH (ref 11.5–15.5)

## 2011-02-19 DIAGNOSIS — K2980 Duodenitis without bleeding: Secondary | ICD-10-CM

## 2011-02-19 DIAGNOSIS — K859 Acute pancreatitis without necrosis or infection, unspecified: Secondary | ICD-10-CM

## 2011-02-19 DIAGNOSIS — K861 Other chronic pancreatitis: Secondary | ICD-10-CM

## 2011-02-19 DIAGNOSIS — K921 Melena: Secondary | ICD-10-CM

## 2011-02-19 LAB — TYPE AND SCREEN
ABO/RH(D): O NEG
Antibody Screen: NEGATIVE
Unit division: 0

## 2011-02-19 LAB — BASIC METABOLIC PANEL
CO2: 31 mEq/L (ref 19–32)
Calcium: 8.8 mg/dL (ref 8.4–10.5)
Glucose, Bld: 106 mg/dL — ABNORMAL HIGH (ref 70–99)
Potassium: 3.6 mEq/L (ref 3.5–5.1)
Sodium: 137 mEq/L (ref 135–145)

## 2011-02-19 LAB — CBC
Hemoglobin: 10.7 g/dL — ABNORMAL LOW (ref 13.0–17.0)
MCH: 32.4 pg (ref 26.0–34.0)
Platelets: 123 10*3/uL — ABNORMAL LOW (ref 150–400)
RBC: 3.3 MIL/uL — ABNORMAL LOW (ref 4.22–5.81)
WBC: 3.8 10*3/uL — ABNORMAL LOW (ref 4.0–10.5)

## 2011-02-20 ENCOUNTER — Other Ambulatory Visit: Payer: Self-pay | Admitting: Internal Medicine

## 2011-02-20 ENCOUNTER — Inpatient Hospital Stay (HOSPITAL_COMMUNITY): Payer: Medicare Other

## 2011-02-20 DIAGNOSIS — K2980 Duodenitis without bleeding: Secondary | ICD-10-CM

## 2011-02-20 DIAGNOSIS — R195 Other fecal abnormalities: Secondary | ICD-10-CM

## 2011-02-20 DIAGNOSIS — K859 Acute pancreatitis without necrosis or infection, unspecified: Secondary | ICD-10-CM

## 2011-02-20 LAB — POCT I-STAT 4, (NA,K, GLUC, HGB,HCT)
Glucose, Bld: 97 mg/dL (ref 70–99)
Hemoglobin: 10.9 g/dL — ABNORMAL LOW (ref 13.0–17.0)
Potassium: 3.3 mEq/L — ABNORMAL LOW (ref 3.5–5.1)
Sodium: 140 mEq/L (ref 135–145)

## 2011-02-20 LAB — CBC
HCT: 30.3 % — ABNORMAL LOW (ref 39.0–52.0)
MCHC: 33.7 g/dL (ref 30.0–36.0)
MCV: 95 fL (ref 78.0–100.0)
Platelets: 122 10*3/uL — ABNORMAL LOW (ref 150–400)
RDW: 18.3 % — ABNORMAL HIGH (ref 11.5–15.5)
WBC: 3.3 10*3/uL — ABNORMAL LOW (ref 4.0–10.5)

## 2011-02-20 IMAGING — CR DG CHEST 2V
2 series · 2 of 2 positions shown · non-contrast
Comparison: 06/22/2010 and earlier.

CLINICAL DATA: 54-year-old male, medical clearance, suicidal.

CHEST - 2 VIEW

[w chest pa]
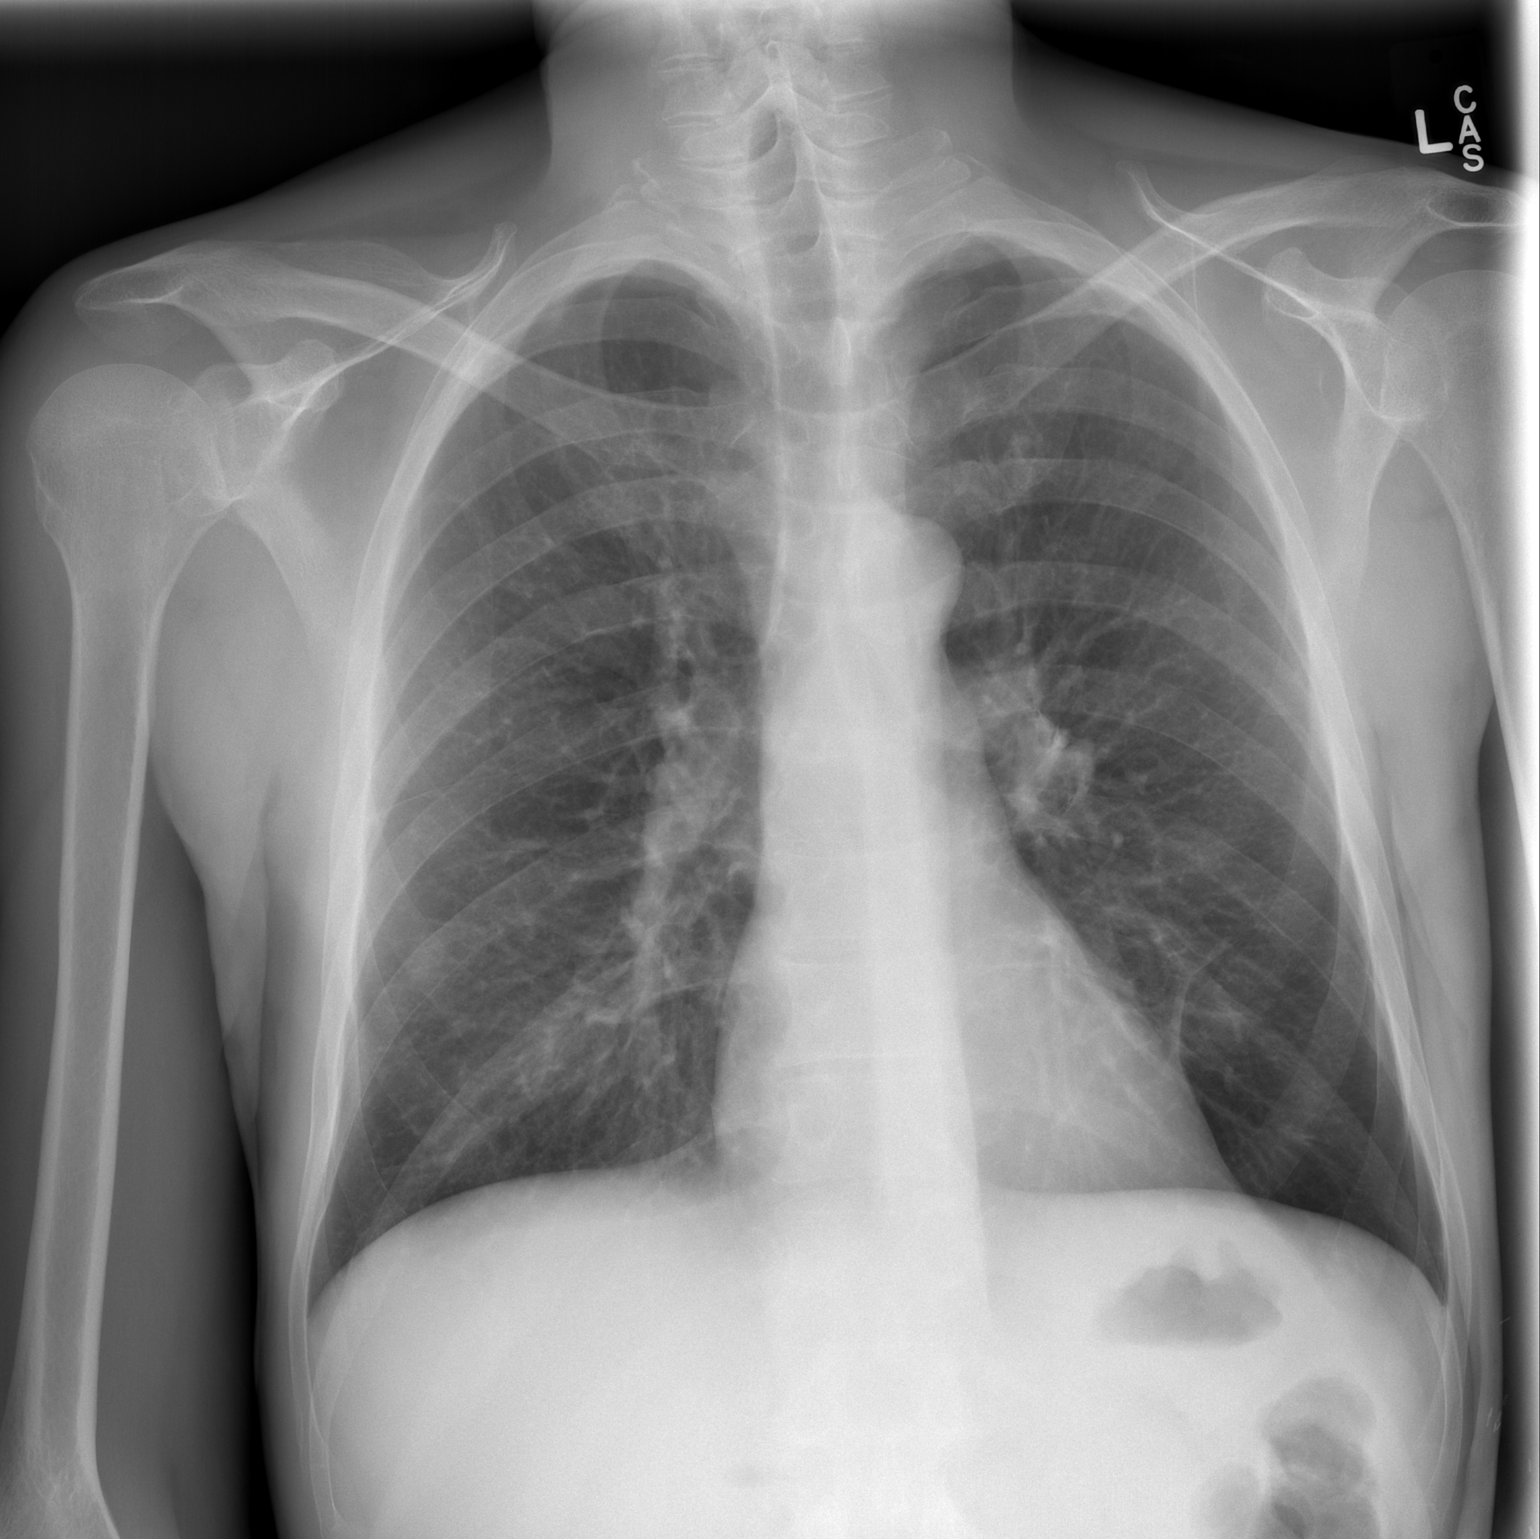

[w chest lat]
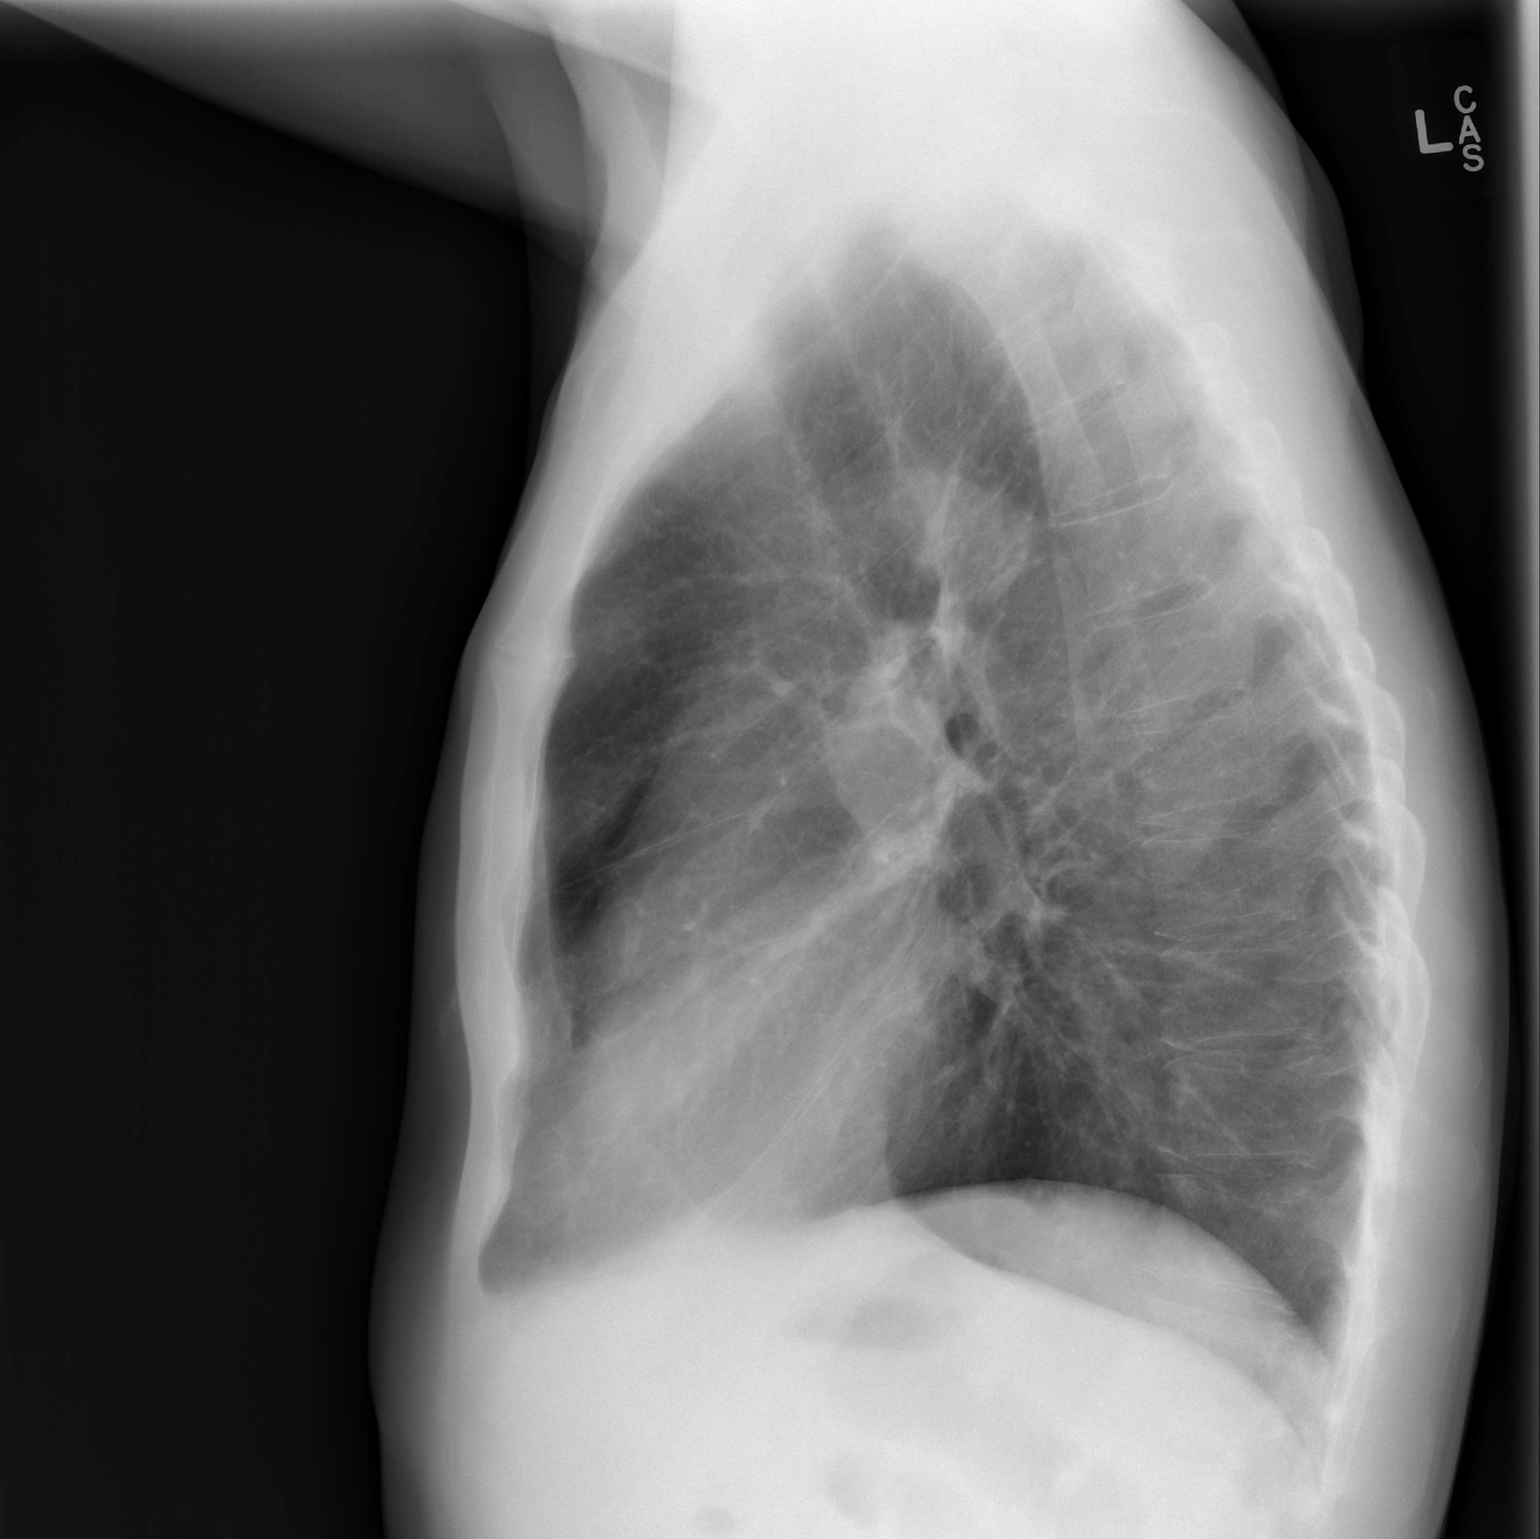

[2 of 2 positions shown; findings below may reference images not displayed]

FINDINGS: Large lung volumes.  Cardiac size and mediastinal
contours are within normal limits.  Visualized tracheal air column
is within normal limits.  Stable mild diffuse increased
interstitial markings.  Otherwise, the lungs are clear. No acute
osseous abnormality identified.
IMPRESSION: Chronic hyperinflation. No acute cardiopulmonary abnormality.

## 2011-02-21 ENCOUNTER — Encounter: Payer: Self-pay | Admitting: Internal Medicine

## 2011-02-21 LAB — CBC
MCH: 31.6 pg (ref 26.0–34.0)
MCHC: 32.5 g/dL (ref 30.0–36.0)
Platelets: 138 10*3/uL — ABNORMAL LOW (ref 150–400)

## 2011-02-21 NOTE — Discharge Summary (Signed)
[name] was admitted for severe abdominal pain and alcohol withdrawal. Patient was given bowel rest for presumed acute exacerbation of chronic pancreatitis. Hgb decreased 8.9-->6.5 and patient described melenic stool and coffee ground emesis, so EGD was done to look for site of active bleeding. No active bleeding identified, only gastritis and esophagitis. Pt received 2 units PRBCs and Hgb increased to 10.5. Pt continued on PPI (had not been taking regularly as outpatient). Patient agreed to inpatient alcohol rehabilitation. Patient was NOT given narcotic pain medicine at discharge.

## 2011-02-21 NOTE — Progress Notes (Signed)
Patient: [name], [name]  DOB: [birth year]  Date of Admission: 02/13/2011 Date of Discharge: 02/21/2011  Physician: Blanch Media, MD  CC: 1) Dr. Lina Sar with gastroenterology, 2) [facility]  Discharge Diagnoses (with supporting info) 1. Acute pancreatitis, alcohol-induced 2. Alcohol abuse, long-standing 3. Normocytic anemia, baseline Hgb 10, on erythropoietin (aranesp) 4. Esophagitis 5. Duodenitis 6. Mild gastritis 7. End stage renal disease due to hypertension on  MWF dialysis via LUE graft, baseline Cr 4.8  Discharge Medications (see above)  Disposition and Follow up: a. Patient was discharged to [facility] alcohol rehab in improved condition. b. Patient will follow-up at [facility] internal medicine clinic with Dr. Anselm Jungling on 7/26 at 4:15 pm. At f/u appt, please check Hgb, encourage continued use of PPI, and encourage participation in AA or other rehabilitation program for alcoholism. Please consider screening colonoscopy for this patient. The patient was NOT given narcotic pain medicine at discharge.  Procedures:  EGD: Duodenitis, esophagitis, mild gastritis, no active bleeding. Biopsy obtained and sent to pathology, still pending.  Radiology: CXR: Emphysematous changes.  No evidence of active pulmonary disease.  Consultations: Renal for hemodialysis, gastroenterology for abdominal pain and possible GI bleed  Brief Admitting History and Physical (see H&P-include PE, admission labs, admission studies)  Hospital Course [name] is a 55 yo man with history of alcohol abuse, hepatitis C, end stage renal disease, and multiple admissions for abdominal pain and alcohol withdrawal. He presented to the hospital on 02/13/2011 with severe abdominal pain radiating to the back. He also complained of nausea and vomiting, with coffee-ground emesis. The following is a description of his hospital course by problem: 1. Abdominal pain: The patient has multiple admissions for acute  exacerbations of chronic pancreatitis, but CT abdomen on last admission did not show any signs of pancreatitis. The patient also has known alcohol-induced gastritis. The patient's pain was treated with morphine during his entire hospital course. He had not been taking a PPI as an outpatient, although one has been prescribed, but he was given Protonix in the hospital and sent home with a prescription. He was given bowel rest with gradual advancement of his diet as tolerated. EGD on 7/17 revealed esophagitis, gastritis, and duodenitis. A biopsy was taken and sent to pathology. The pathology results had not returned by the time of discharge. 2. Normocytic anemia: The patient had a Hgb 8.9 on admission and dropped to 6.5 by hospital day 5. He was transfused 2 units of PRBCs and Hgb increased to 10.5. Given the dramatic rise in Hgb after 2 units, it is likely that the patient's Hgb was not truly 6.5. The patient is on HD and receives Aranesp injections per renal. The Hgb remained stable for the remainder of hospitalization. 3. Alcohol withdrawal: The patient has a long history of alcohol abuse and experienced withdrawal symptoms including agitation, confusion, tactile and visual hallucinations, and tachycardia. This is consistent with delirium tremens. The patient was placed on a modified CIWA protocol with evaluation of CIWA scores and administration of Ativan as needed every 2 hours. The patient had a sitter present for much of his hospital stay due to confusion and agitation. The patient met with social work and expressed interest in alcohol rehabilitation. He is willing to participate in inpatient rehabilitation and was discharged to [facility] alcohol rehabilitation program. 4. End stage renal disease: the patient has end stage renal disease secondary to hypertension and had not had HD since 7/5 when he presented to the hospital. He was hyperkalemic with K  of 6.0. He also had hypocalcemia and hyperphosphatemia.  Nephrology consulted on this patient and managed his hemodialysis and correction of electrolyte imbalances. He returned to his MWF HD schedule and Creatinine was at baseline 4.8 at the time of discharge.   Discharge Vitals Temperature: 98.6, Pulse 74, Respirations 20, BP 119/75, SpO2 96% on room air  Discharge Labs:  WBC                                      4.2               4.0-10.5         K/uL  RBC                                      3.26       l      4.22-5.81        MIL/uL  Hemoglobin (HGB)                         10.3       l      13.0-17.0        g/dL  Hematocrit (HCT)                         31.7       l      39.0-52.0        %  MCV                                      97.2              78.0-100.0       fL  MCH -                                    31.6              26.0-34.0        pg  MCHC                                     32.5              30.0-36.0        g/dL  RDW                                      18.4       h      11.5-15.5        %  Platelet Count (PLT)                     138        l      150-400          K/uL

## 2011-02-22 IMAGING — US US ABDOMEN COMPLETE
1 series · 14 of 25 positions shown · non-contrast
Comparison: 06/13/2010 CT.

CLINICAL DATA: Alcohol withdrawal.  Abdominal pain.  Dialysis
patient.

COMPLETE ABDOMINAL ULTRASOUND

[Series 1: us abdomen complete · 0.30mm/px · 14 of 51 slices shown]
[im 1/51]
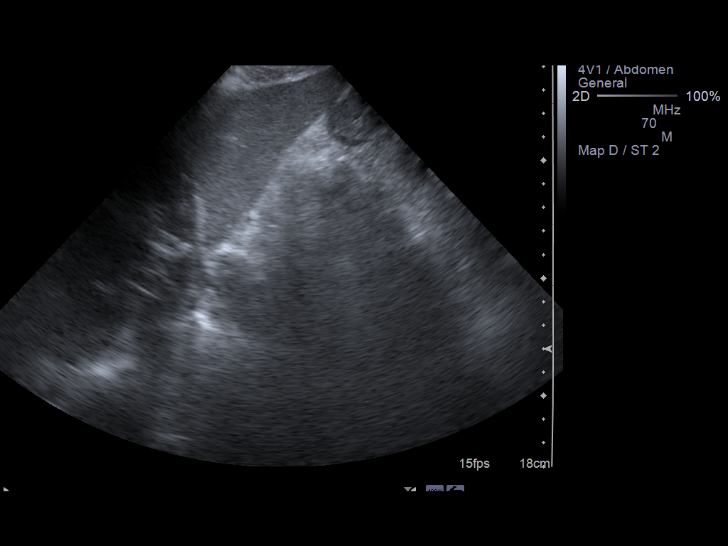
[im 5/51]
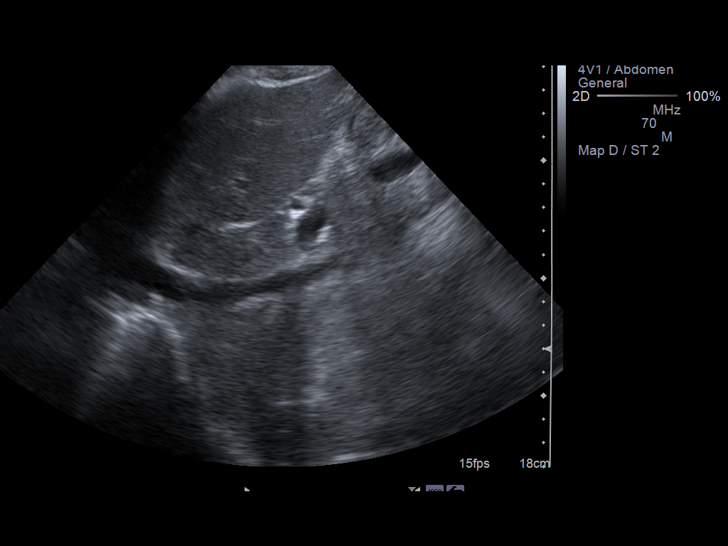
[im 9/51]
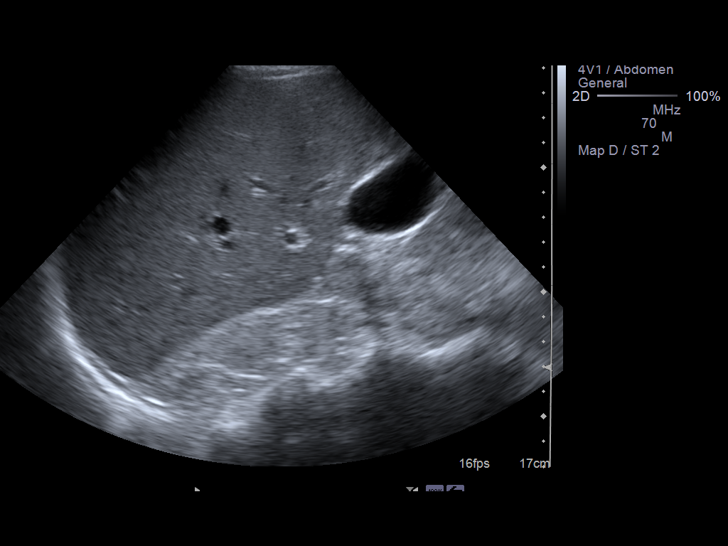
[im 13/51]
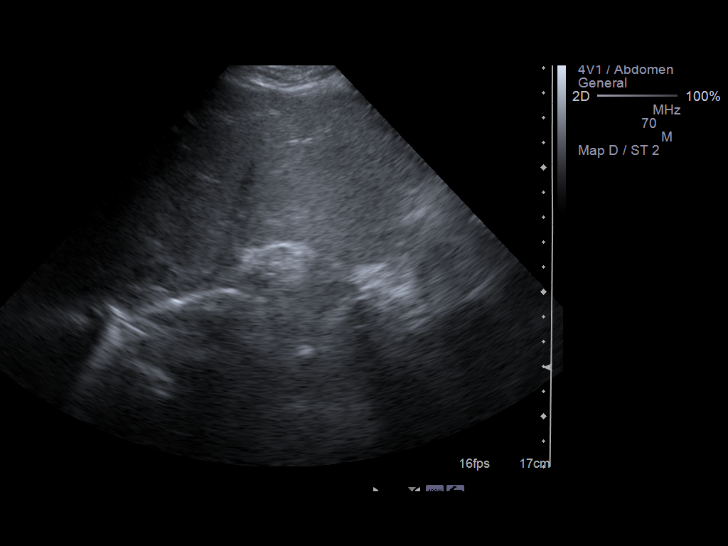
[im 17/51]
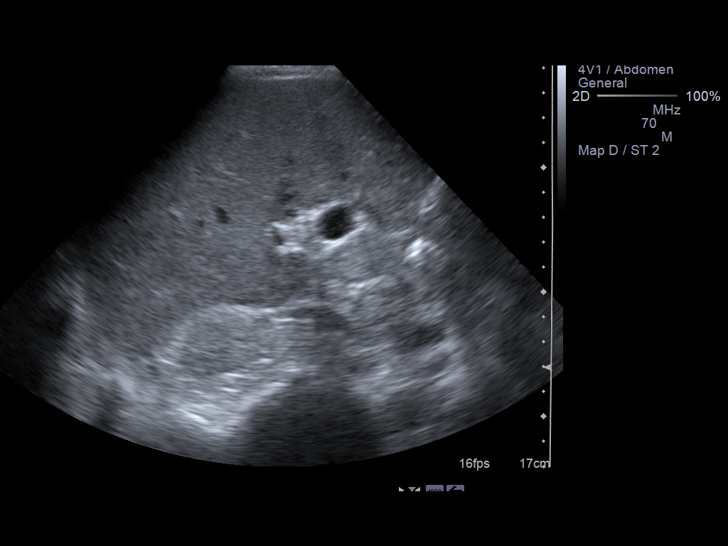
[im 19/51]
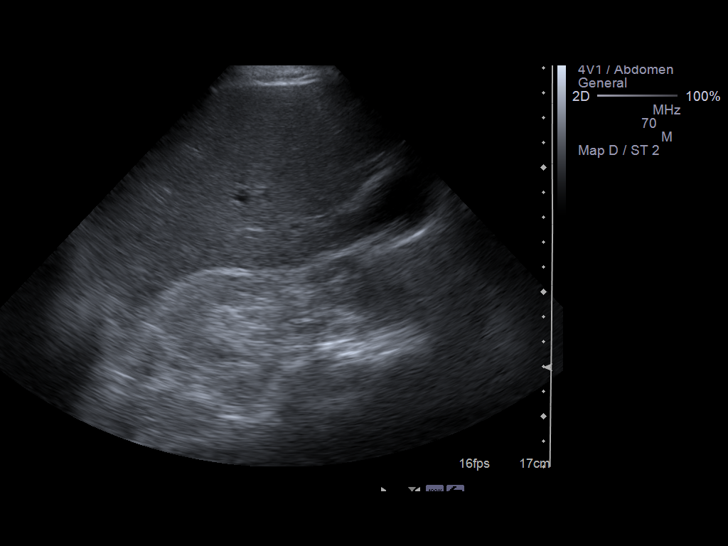
[im 23/51]
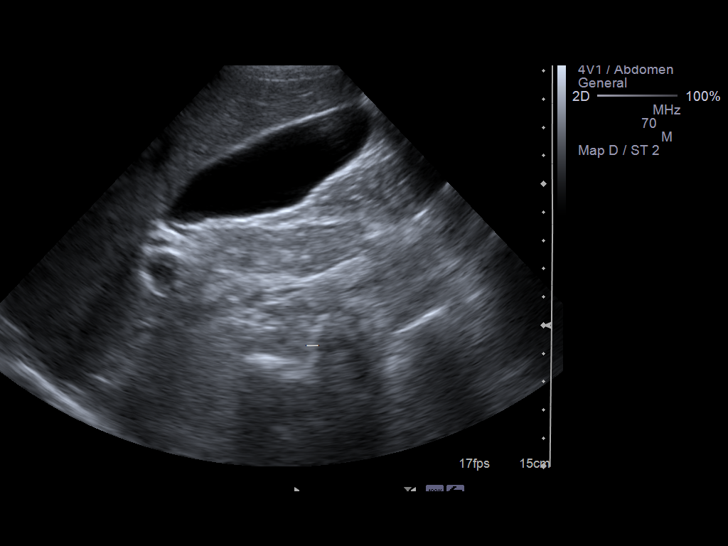
[im 28/51]
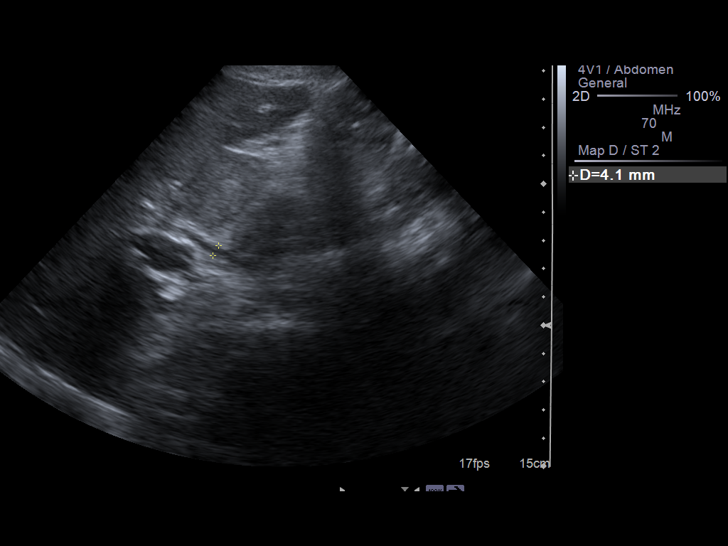
[im 32/51]
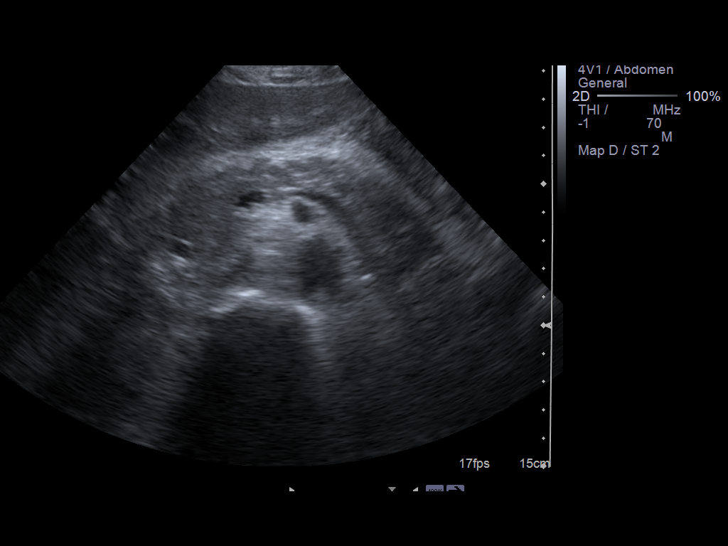
[im 34/51]
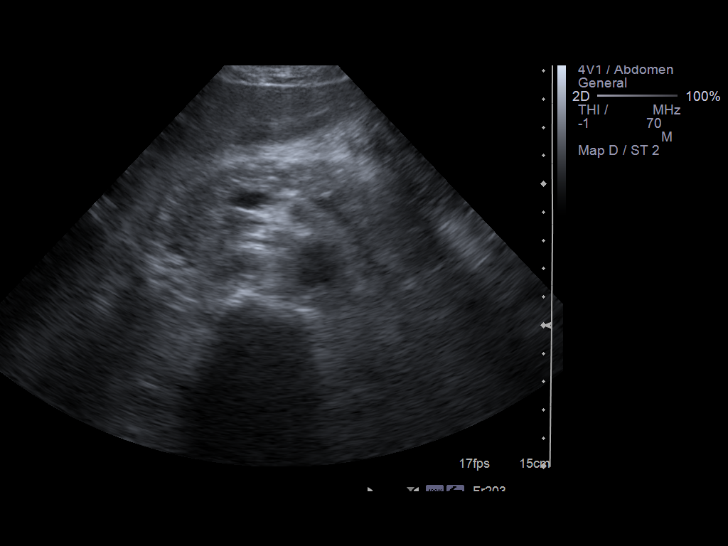
[im 38/51]
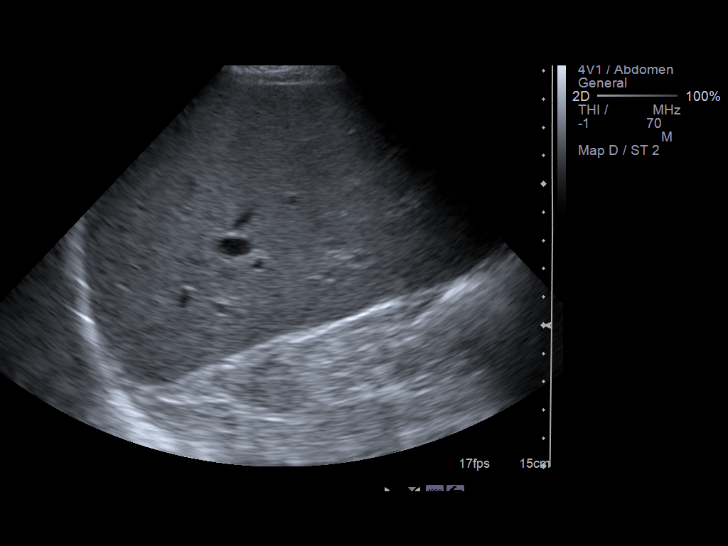
[im 42/51]
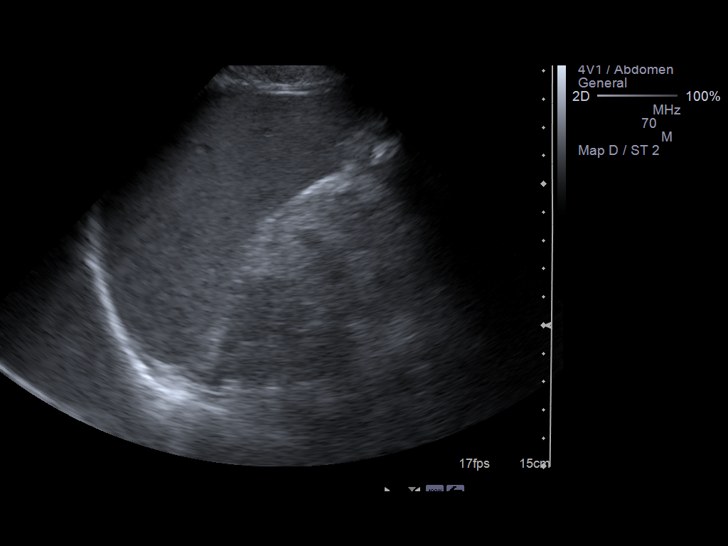
[im 46/51]
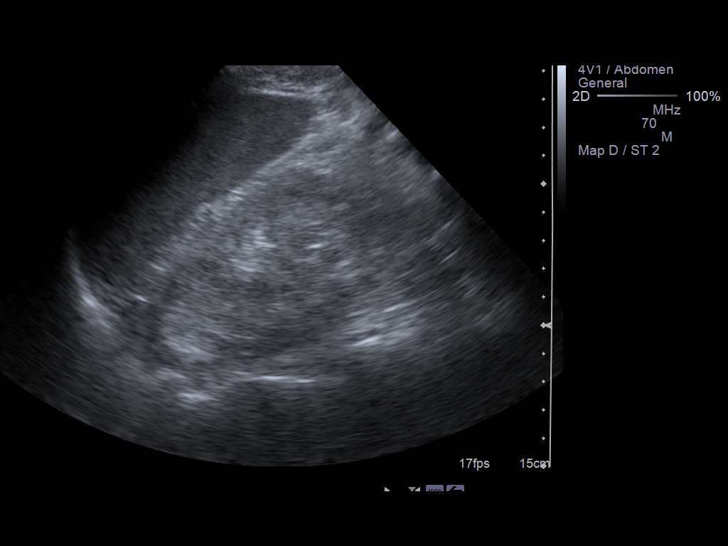
[im 51/51]
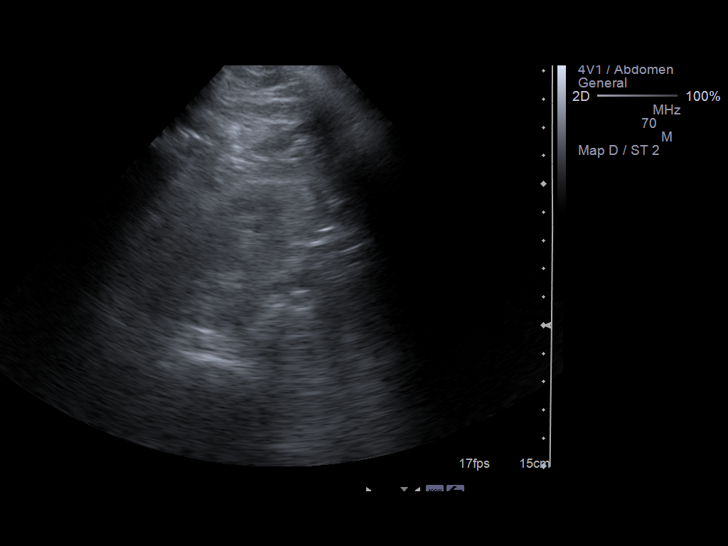

[14 of 25 positions shown; findings below may reference images not displayed]

FINDINGS: Gallbladder:  No gallstones, gallbladder wall thickening or
pericholecystic fluid.

Common bile duct:  4.1 mm.

Liver:  Mild hepatomegaly spanning over 19 cm.  No focal hepatic
lesion or intrahepatic biliary duct dilatation.

IVC:  Negative.

Pancreas:  No pancreatic mass or pancreatic duct dilation.

Spleen:  Top normal measuring 11.7 cm.  No focal mass.

Right Kidney:  7.1 cm.  Increased echogenicity consistent with
result of medical renal disease. No hydronephrosis or renal mass.

Left Kidney:  Poorly delineated secondary to bowel gas.  7.8 cm.
No hydronephrosis.  Increased echogenicity consistent with medical
renal disease.

Abdominal aorta:  Mild atherosclerotic type changes without
aneurysmal dilation.
IMPRESSION: Mild hepatomegaly and top normal sized spleen.

No gallstones.

Increased echogenicity of small kidneys consistent with medical
renal disease type changes.

Ultrasound of pancreas is unremarkable.  If pancreatitis is of high
clinical concern CT imaging may be considered.

## 2011-02-23 ENCOUNTER — Encounter: Payer: Self-pay | Admitting: Internal Medicine

## 2011-02-28 ENCOUNTER — Ambulatory Visit (INDEPENDENT_AMBULATORY_CARE_PROVIDER_SITE_OTHER): Payer: Medicare Other | Admitting: Internal Medicine

## 2011-02-28 ENCOUNTER — Encounter: Payer: Self-pay | Admitting: Internal Medicine

## 2011-02-28 VITALS — BP 179/102 | HR 89 | Temp 98.6°F | Wt 156.6 lb

## 2011-02-28 DIAGNOSIS — R1084 Generalized abdominal pain: Secondary | ICD-10-CM

## 2011-02-28 DIAGNOSIS — F1010 Alcohol abuse, uncomplicated: Secondary | ICD-10-CM

## 2011-02-28 DIAGNOSIS — K861 Other chronic pancreatitis: Secondary | ICD-10-CM

## 2011-02-28 DIAGNOSIS — D61818 Other pancytopenia: Secondary | ICD-10-CM

## 2011-02-28 LAB — CBC
HCT: 27.6 % — ABNORMAL LOW (ref 39.0–52.0)
MCV: 96.8 fL (ref 78.0–100.0)
Platelets: 171 10*3/uL (ref 150–400)
RBC: 2.85 MIL/uL — ABNORMAL LOW (ref 4.22–5.81)
WBC: 2.7 10*3/uL — ABNORMAL LOW (ref 4.0–10.5)

## 2011-02-28 MED ORDER — OXYCODONE HCL 5 MG PO TABS: 5.0000 mg | ORAL_TABLET | ORAL | Status: AC | PRN

## 2011-02-28 NOTE — Progress Notes (Signed)
  Subjective:    Patient ID: [name], male    DOB: 09/19/1955, 55 y.o.   MRN: [id number]  HPI  [name] is a 55 year old man with pmh significant for ESRD on HD, Pancytopenia, HTN, Alcoholism, with associated complications such as recurrent pancreatitis, and gastritis. He presents today for hospital follow up.   [name] has been sober since hospital discharge 07/19. Currently enrolled in [facility] which is a counseling program. He states this program is helping a lot. He goes there twice a week on non-HD days.   He has been taking PPI once daily as directed, however still complains of abdominal pain. He denies rectal bleeding, or vomiting. He denies feeling dizzy or lightheaded, and is trying his best not to relapse. He was not able to go to [facility] as this facility will not take dialysis patients.   Review of Systems  All other systems reviewed and are negative.       Objective:   Physical Exam  Constitutional: He is oriented to person, place, and time. He appears well-developed.  HENT:  Head: Normocephalic.  Eyes: Pupils are equal, round, and reactive to light.  Neck: Normal range of motion.  Cardiovascular: Normal rate and regular rhythm.   Pulmonary/Chest: Effort normal and breath sounds normal.  Abdominal: Soft. There is tenderness.  Musculoskeletal: Normal range of motion.  Neurological: He is alert and oriented to person, place, and time.  Psychiatric:       Anxious          Assessment & Plan:

## 2011-02-28 NOTE — Patient Instructions (Signed)
Please follow up in 1 month.  Please take all medications as directed.  

## 2011-03-01 NOTE — Assessment & Plan Note (Signed)
Secondary to gastritis and chronic pancreatitis.  Oxycodone prescription, 30 tabs given today, as patient recently discharged 7/19 and still with abdominal pain.

## 2011-03-01 NOTE — Assessment & Plan Note (Signed)
Patient has been evaluated by Heme/Onc July 2nd during hospitalization. No intrinsic bone marrow problem is suspected at this time, and it is felt that the pancytopenia is an alcohol abuse, as well as elevated portal pressure from cirrhosis and hypertension.  CBC:    Component Value Date/Time   WBC 2.7* 02/28/2011 1624   HGB 8.7* 02/28/2011 1624   HCT 27.6* 02/28/2011 1624   PLT 171 02/28/2011 1624   MCV 96.8 02/28/2011 1624   NEUTROABS 4.8 02/13/2011 1915   LYMPHSABS 1.2 02/13/2011 1915   MONOABS 0.1 02/13/2011 1915   EOSABS 0.0 02/13/2011 1915   BASOSABS 0.0 02/13/2011 1915    Hgb has dropped from 10.3 on discharge to 8.7 today, although patient denies bleeding. He was scoped during his last hospitalization which revealed duodenitis, mild gastritis, and esophagitis, but no active bleeding. The pathology report revealed duodenitis, without giardia.   Path: Duodenum, biopsy, :   - FOCALLY ERODED REACTIVE/REGENERATIVE DUODENAL MUCOSA WITH VILLOUS DISTORTION,   PROMINENT BENIGN BRUNNER GLANDS AND NEUTROPHILIC INFLAMMATION, SEE COMMENT.   - SEPARATE FRAGMENT OF REACTIVE GASTRIC OXYNTIC MUCOSA.   - NO GIARDIA IDENTIFIED.  WBC low at 2.7, and appears his baseline is between 2-4. Will continue to monitor, no plans for BM biopsy at this time.

## 2011-03-01 NOTE — Assessment & Plan Note (Signed)
Secondary to alcoholic abuse. Pt usually admitted for bowel rest and pain control. Pt still with complaints of abdominal pain, epigastric region. Pain usually controlled with morphine sulfate during in patient stay. Will give patient oxycodone 5 mg po q4h and have patient follow in 1 month.

## 2011-03-01 NOTE — Assessment & Plan Note (Signed)
Currently enrolled in [facility]) and states it is helping. Will continue to monitor his progress. No evidence of withdrawal today.
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
## 2011-03-07 NOTE — Discharge Summary (Signed)
NAMEARISTIDIS, TALERICO NO.:  [id number]  MEDICAL RECORD NO.:  [id number]  LOCATION:  6706                         FACILITY:  [facility]  PHYSICIAN:  Blanch Media, M.D.DATE OF BIRTH:  [birth year]  DATE OF ADMISSION:  02/13/2011 DATE OF DISCHARGE:  02/21/2011                              DISCHARGE SUMMARY   DISCHARGE DIAGNOSES: 1. Acute pancreatitis, alcohol induced. 2. Alcohol abuse, longstanding 3. Normocytic anemia, baseline hemoglobin 10, on erythropoietin or     Aranesp. 4. Esophagitis. 5. Duodenitis. 6. Mild gastritis. 7. End-stage renal disease due to hypertension on Monday, Wednesday,     Friday dialysis via left upper extremity graft, baseline creatinine     4.8.  DISCHARGE MEDICATIONS: 1. Aranesp 200 mcg IV with hemodialysis. 2. Multivitamin 1 tablet by mouth daily. 3. Paricalcitol 5 mcg IV with hemodialysis. 4. Calcium acetate 667 mg 3 capsules by mouth 3 times a day. 5. Creon 1 capsule by mouth 3 times a day with meals. 6. Folic acid 1 mg by mouth daily. 7. Protonix 40 mg 1 tablet by mouth daily. 8. Renal vitamin 1 tablet by mouth daily. 9. Spiriva 18 mcg 1 capsule inhale daily. 10.Thiamine 100 mg 1 tablet by mouth daily. 11.Ventolin inhaler 2 puffs inhaled every 4 hours as needed for     shortness of breath.  DISPOSITION AND FOLLOWUP: 1. The patient was discharged to [facility]     in improved condition. (We were lated informed that due to the pts HD requirment, he would not qualify for inpt Rehab and was offered outpt treatment instead.) 2. The patient will follow up at [facility]     with Dr. Anselm Jungling on July 26 at 4:15 p.m.  At followup appointment,     please check hemoglobin, encourage continued use of PPI, and     encourage participation in AA or other rehabilitation program for     alcoholism.  Please consider screening colonoscopy for this     patient.  The patient was not given narcotic  pain medicine at     discharge.  3.  The patient should follow up for hemodialysis on     Monday, Wednesdays and Fridays with [facility].  PROCEDURES:  EGD revealed duodenitis, esophagitis, mild gastritis, and no active bleeding.  Biopsy obtained and sent to Pathology and is still pending.  RADIOLOGY:  Chest x-ray revealed emphysematous changes.  No evidence of acute pulmonary disease.  CONSULTATIONS:  Renal for hemodialysis, Gastroenterology for abdominal pain and possible GI bleed.  ADMITTING HISTORY AND PHYSICAL CHIEF COMPLAINT:  Abdominal pain, nausea and vomiting and alcohol withdrawal.  HISTORY OF PRESENT ILLNESS:  This is a 55 year old gentleman with past medical history significant for chronic alcohol intoxication, end-stage renal disease on hemodialysis, hepatitis C, and chronic alcoholic pancreatitis with multiple admissions for acute exacerbation who presented to [facility] ED with main concern of generalized abdominal pain, worse on left, radiating to his back, constant 10/10 in severity with no specific aggravating or alleviating factors.  He states that he has had nausea and vomiting with watery diarrhea for the last day.  He states that he thinks he is  vomited at least 30 times.  He states that his vomit have black flecks in it and that the diarrhea has been mostly water with green and black flecks in it over the last day as well.  He states that this is typical of his pancreatitis flares.  He states for the last few weeks he has been drinking two fifth of the cheapest vodka that he could find up until yesterday evening.  He denies any other drugs.  He states that with his nausea and vomiting he has not been eating any solid foods for the last day or so.  He denies chest pain, shortness of breath, fever, chills, dizziness on standing or other systemic symptoms.  He also states that he last received dialysis on Friday July 6.  OUTPATIENT  MEDICATIONS: 1. Albuterol 2 puffs into the lungs every 6 hours as needed. 2. Amylase, lipase, protease take 3 capsules by mouth 3 times a day     with meals. 3. Rena-Vite 1 mg tab by mouth daily. 4. Calcium acetate 667 mg capsule by mouth 3 times daily with meals. 5. Folvite (folic acid) 1 mg by mouth daily. 6. Neurontin 100 mg capsule take 1 capsule by mouth 3 times daily. 7. Iron sucrose inject into the vein. 8. Zofran 4 mg take 1 tablet by mouth every 8 hours as needed for     nausea. 9. Protonix 40 mg 1 tablet by mouth daily. 10.Thiamine 100 mg tablet by mouth daily. 11.Spiriva 18 mcg inhaler and inhale daily.  ALLERGIES:  The patient indicates that he has no known allergies to drugs.  PAST MEDICAL HISTORY: 1. Pancytopenia, chronic 2. Polysubstance abuse, chronic, most notable for alcohol. 3. End-stage renal disease, on hemodialysis. 4. Malignant hypertension. 5. Hepatitis C. 6. COPD. 7. Chronic recurrent pancreatitis, likely secondary to alcoholism. 8. Smoker. 9. Alcohol abuse. 10.Respiratory failure, history of ventilator-dependence. 11.Alcohol withdrawal. 12.Alcoholic pancreatitis. 13.Renal insufficiency.  No past surgical histories on file.  PHYSICAL EXAMINATION:  VITAL SIGNS:  Temperature 98.5, pulse 80, blood pressure 123/83, respiratory 16, oxygen saturation 100% on room air. GENERAL:  The patient is a thin man who appears older than his stated age.  He is in mild distress from pain and markedly tremulous.  He is cooperative with the exam.  Alert and oriented x3. HEENT:  Head is atraumatic.  Mouth, no erythema or exudates.  Mucous membranes are moist.  Eyes; pupils round and reactive to light, extraocular movements intact.  Conjunctivae normal.  No scleral icterus. NECK:  Supple.  Normal range of motion.  No JVD, mass or thyromegaly. No carotid bruits present. CARDIOVASCULAR:  Tachycardic rate, regular rhythm, normal S1 and S2, no murmurs, rubs or gallops,  pulses symmetric and intact bilaterally. PULMONARY:  Clear to auscultation except for occasional extra wheezes in the bilateral bases.  No rales or rhonchi. ABDOMEN:  Soft, mildly tender diffusely, mildly distended.  Bowel sounds are normal.  No guarding on exam.  No masses or organomegaly present. GU:  No CVA tenderness. MUSCULOSKELETAL:  No joint deformities, erythema or stiffness.  Range of motion full and no tenderness. NEUROLOGICAL:  Alert and oriented x3.  Strength is normal and symmetric bilaterally, cranial nerves II through XII are grossly intact.  No focal motor deficit, sensory intact to light touch bilaterally.  There is marked tremor that does appears when the patient is distracted. SKIN:  Warm, dry and intact.  No rash, cyanosis or clubbing. PSYCHIATRIC:  Markedly anxious mood and tearful affect.  Speech is  pressure and behavior is bizarre.  Judgment and insight are poor. Thought content normal.  Cognition and memory appear normal.  ADMISSION LABS:  WBC 6.1, hemoglobin 8.9, hematocrit 26.3, platelets 357.  PT 14.3, INR 1.09, sodium 136, potassium 6.0, chloride 93, CO2 16, BUN 123, creatinine 9.32, glucose 86, calcium 6.4, total protein 7.4, albumin 2.9, AST 131, ALT 89, alk phos 82, total bilirubin 0.5, alcohol less than 11, lipase 98.  UA; trace hemoglobin, protein greater than 300, remainder negative, magnesium 1.8, phosphorus 11.1, calcium 6.3.  ADMISSION STUDIES:  EKG showed sinus tachycardia, peak T-waves diffusely, poor R-wave progression.  HOSPITAL COURSE:  Mr. Ganoe is a 55 year old man with a history of alcohol abuse, hepatitis C, end-stage renal disease on hemodialysis and multiple admissions for abdominal pain and alcohol withdrawal.  He presented to the hospital on February 13, 2011, with severe abdominal pain radiating to the back.  He also complained of nausea and vomiting, with coffee-ground emesis.  The following is a description of his hospital course by  problem; 1. Abdominal pain.  The patient has multiple admissions for acute     exacerbations of chronic pancreatitis with CT abdomen on last     admission did not show any signs of pancreatitis.  The patient also     has known alcohol-induced gastritis.  The patient's pain was     treated with morphine during his entire hospital course.  He had     not been taking up PPI as an outpatient, although one had been     prescribed, but he was given Protonix in the hospital and sent home     with prescription.  He was given bowel rest with gradual     advancement of his diet as tolerated.  EGD on July 17 revealed     esophagitis, gastritis, and duodenitis.  A biopsy was taken and     sent to Pathology.  The pathology results had not returned by the     time of discharge. 2. Normocytic anemia.  The patient had a hemoglobin of 8.9 on     admission and dropped to 6.5 by hospital day #5.  He was transfused     2 units of packed red blood cells and his hemoglobin increased to     10.5.  Given a dramatic rise in hemoglobin after 2 units, it is     likely that the patient's hemoglobin was not truly 6.5.  The     patient is on hemodialysis and received Aranesp injections from     Nephrology.  The hemoglobin remained stable for the remainder of     hospitalization. 3. Alcohol withdrawal.  The patient has a long history of alcohol     abuse and experienced withdrawal symptoms including agitation,     confusion, tactile and visual hallucinations and tachycardia.  This     is consistent with delirium tremens.  The patient was placed on a     modified CIWA protocol with evaluation of CIWA scores and     administration of Ativan as needed every 2 hours.  The patient had     a sitter present for much of his hospital stay due to confusion and     agitation.  The patient met with social work and expressed interest     in alcohol rehabilitation.  He was willing to participate in     inpatient rehabilitation and  was discharged to [facility].  4. End-stage renal disease.  The patient has end-stage renal disease     secondary to hypertension and not had hemodialysis since July 5     when he presented to the hospital.  He was hyperkalemic with a     potassium of 6.0.  He also had hypocalcemia and hyperphosphatemia.     Nephrology consulted on this patient and managed his hemodialysis     and correction of electrolyte imbalances.  He returned his Monday,     Wednesday, Friday hemodialysis schedule and his creatinine was at     baseline 4.8 at the time of discharge.  DISCHARGE VITALS:  Temperature 98.6, pulse 74, respirations 20, blood pressure 119/75, oxygen saturation 96% on room air.  DISCHARGE LABS:  White blood count 4.2, hemoglobin 10.3, hematocrit 31.7, platelet count 138, MCV 97.2.     Deatra Robinson, MD   ______________________________ Blanch Media, M.D.    NK/MEDQ  D:  02/21/2011  T:  02/21/2011  Job:  [id number]  cc:   Hedwig Morton. Juanda Chance, MD [facility]  Electronically Signed by Deatra Robinson  on 02/25/2011 08:37:24 AM Electronically Signed by Blanch Media M.D. on 03/07/2011 09:16:05 AM

## 2011-03-07 NOTE — Discharge Summary (Signed)
[name], [name]               ACCOUNT NO.:  [id number]  MEDICAL RECORD NO.:  [id number]  LOCATION:  5525                         FACILITY:  [facility]  PHYSICIAN:  Blanch Media, M.D.DATE OF BIRTH:  June 02, 1956  DATE OF ADMISSION:  01/28/2011 DATE OF DISCHARGE:  02/01/2011                              DISCHARGE SUMMARY   DISCHARGE DIAGNOSES: 1. Acute on chronic pancreatitis. 2. Alcohol withdrawal. 3. Pancytopenia secondary to alcohol abuse. 4. End-stage renal disease on hemodialysis.  DISCHARGE MEDICATIONS: 1. Calcium carbonate 500 mg p.o. q.6 h. p.r.n. 2. Clonidine 0.2 mg p.o. b.i.d. 3. Zofran 4 mg p.o. q.4 h. p.r.n. for nausea and vomiting. 4. Calcium acetate 667 mg 3 capsules t.i.d. 5. Creon 24,000 units 1 capsule by mouth t.i.d. with meals. 6. Folic acid 1 mg p.o. daily. 7. Protonix 40 mg p.o. daily 8. Renal vitamin 1 tablet p.o. daily. 9. Spiriva 18 mcg 1 puff daily. 10.Thiamine 100 mg p.o. daily. 11.Albuterol 2 puffs q.4 h. p.r.n. for shortness of breath.  DISPOSITION AND FOLLOWUP:  [name] was discharged from [facility] on February 01, 2011, in stable and improved condition.  He will need to take Zofran as needed for his nausea and vomiting.  He will need to make an appointment with outpatient clinic in 1-2 weeks for followup. At that time, his PCP will need to: 1. Repeat a CBC to make sure that his pancytopenia is stable. 2. Follow up on his chronic pancreatitis.  The patient was not given     narcotics during hospital course because of his history of narcotic     abuse.  CONSULTATION: 1. Nephrology. 2. Hematology with Pierce Crane, MD, [facility].  PROCEDURE PERFORMED:  CT of abdomen and pelvis on January 29, 2011, shows new mild-to-moderate ascites of unknown etiology.  Question liver disease.  Suspect mild fatty infiltration of the liver.  Trace right pleural effusion with right base atelectasis.  ADMISSION HISTORY:  [name] is a 55 year old man with  past medical history significant for chronic alcohol intoxication, end-stage renal disease on hemodialysis, hepatitis C, and chronic alcoholic pancreatitis with multiple admissions for acute exacerbation, who presented to the ED with main concern of generalized abdominal pain, worse on the left, radiating to his back, constant, 10/10 in severity with no specific aggravating or alleviating factors and associated with nausea and some bloody vomiting, watery diarrhea.  He is unable to quantify the amount and reports similar episodes of pain in the past and also reported this is his typical pancreatitis flare.  He reports compliance with his dialysis treatment.  He denies any chest pain, short of breath at the moment, but reports experiencing occasional episodes; denies fever, chills, or any other systemic symptoms.  He reports drinking daily and the last drink was 1 day prior to admission.  ADMISSION PHYSICAL EXAMINATION:  VITAL SIGNS:  Temperature 98.7, blood pressure 180/100, heart rate 104, respiratory rate 18, O2 sat 98% on room air. GENERAL:  The patient appears malnourished, no acute distress, sitting in bed and cooperative to examination.  Tremors at rest. HEENT:  Head; normocephalic, atraumatic.  Ears, tympanic membranes normal bilaterally.  Mouth, no erythema or exudate.  Dry mucous membranes.  Eyes; pupils equal, round, reactive to light and accommodation bilaterally.  Extraocular movements intact.  Conjunctivae normal.  No sclerae icterus. NECK:  Supple.  No JVD. CARDIOVASCULAR:  Tachycardic, S1 and S2 normal.  No murmurs. PULMONARY:  Clear to auscultation bilaterally.  No wheezes or crackles. ABDOMEN:  Soft, diffuse tenderness throughout on palpation, nondistended, mild guarding, normal bowel sounds. GU:  No CVA tenderness. MUSCULOSKELETAL:  No joint deformity, erythema, or stiffness. NEUROLOGIC:  Alert and oriented to name and place.  Strength normal and symmetric  bilaterally.  Cranial nerves II through XII grossly intact. No focal deficit noted. EXTREMITIES:  1+ pitting edema bilaterally.  Warm, dry, and intact.  No rashes, cyanosis, or clubbing. PSYCHIATRIC:  Normal mood and affect.  Speech is normal.  ADMISSION LABORATORY DATA:  Sodium 138, potassium 3.3, chloride 96, bicarb 28, glucose 95, BUN 33, creatinine 3.88.  Total bilirubin 0.8, alk phos 61, AST 59, ALT 35, total protein 6.9, albumin 3.0, calcium 8.6, lipase 168.  WBC 1.8, hemoglobin 10.4, hematocrit 29.6, platelets 19.  Coags within normal limits.  HOSPITAL COURSE: 1. Acute on chronic pancreatitis.  The patient was admitted to step-     down unit.  He was treated with supportive care with IV fluids,     initially n.p.o. and then advanced diet as tolerated which he     tolerated well with a renal diet at the time of discharge.  The     patient received Zofran IV for nausea.  The patient was not given     any narcotics for pain control given his extensive history of     narcotic abuse.  The patient actually tolerated well without any     pain medication.  The patient also received a CT scan which shows a     new-to-moderate ascites of unknown etiology.  Question of liver     disease given his heavy history of alcohol abuse.  There is suspect     mild fatty infiltration of the liver.  There is also trace right     pleural effusion with right base atelectasis.  His CT scan did show     a small-to-moderate free fluid in the abdomen and pelvis,     predominantly around liver and spleen.  IR was consulted for a     paracentesis, however, it was deferred because the patient's     platelet was 19,000 and that the risk of bleeding is high given the     fluid is around the liver and the spleen.  IR did want to transfuse     the patient with FFP up to 50,000 of platelets before they can do a     paracentesis.  It was deemed that the fluid was small in amount.     Therefore, a paracentesis is  not necessary at this time. 2. Alcohol withdrawal.  The patient reports daily drinking, however,     his ethanol level was less than 11.  The patient was placed on CIWA     protocol in addition to continue his thiamine and folate.  We also     placed social consult for alcohol cessation.  It was noted that the     patient did not have any tremors or any other sign of alcohol     withdrawal when he was alone, however, in the present of a     physician or nurse, the patient became tremulous. 3. Pancytopenia, likely secondary to his alcohol-induced bone  marrow     suppression.  We consulted Hematology in which Dr. Donnie Coffin evaluated     the patient who also concurred that his pancytopenia is likely from     his alcohol abuse.  HIV was ordered which was nonreactive.  A CA19-     9 showed a level of 25.2 which is negative for pancreatic cancer.     DIC panel was also ordered as well as LDH which were all negative.     In addition, a reticulocyte count shows 0.3% and an absolute     reticulocyte of 9.2 which were both depressed.  Throughout hospital     course, we monitored his CBC which remained stable after 1 unit of     FFP transfusion in the setting of the patient reports having bloody     bowel movement.  We ordered Hemoccult, however, it was not     performed.  His hemoglobin remains at baseline between 8-10.     Heparin-induced platelet antibodies were negative. 4. End-stage renal disease on hemodialysis.  The patient continued to     receive hemodialysis Monday, Wednesday, and Friday during hospital     course without any complication. 5. DVT prophylaxis.  The patient received heparin 5000 units subcu q.8     h. 6. Hypertension.  The patient's blood pressure fluctuated between 130-     160, therefore we started the patient on clonidine 0.2 mg p.o.     b.i.d. and the patient will continue this medication as outpatient.  DISCHARGE VITALS:  Temperature 97.9, pulse 75, respirations 20,  blood pressure 131/77, O2 sat 99% on room air.  DISCHARGE LABORATORY DATA:  Sodium 136, potassium 3.7, chloride 100, bicarb 26, BUN 27, creatinine 5.79, glucose 104.  Albumin 2.7, calcium 8.3, phosphorus 2.9.  WBC 2.5, hemoglobin 8.6, hematocrit 25.9, platelets 31.    ______________________________ Carrolyn Meiers, MD   ______________________________ Blanch Media, M.D.    MH/MEDQ  D:  02/01/2011  T:  02/02/2011  Job:  [id number]  Electronically Signed by Carrolyn Meiers MD on 02/19/2011 10:37:31 AM Electronically Signed by Blanch Media M.D. on 03/07/2011 09:13:26 AM

## 2011-03-11 IMAGING — CR DG CHEST 1V PORT
1 series · 1 of 1 positions shown · non-contrast
Comparison: Chest x-ray 08/15/2010

CLINICAL DATA: Chest pain.

PORTABLE CHEST - 1 VIEW

[AP]
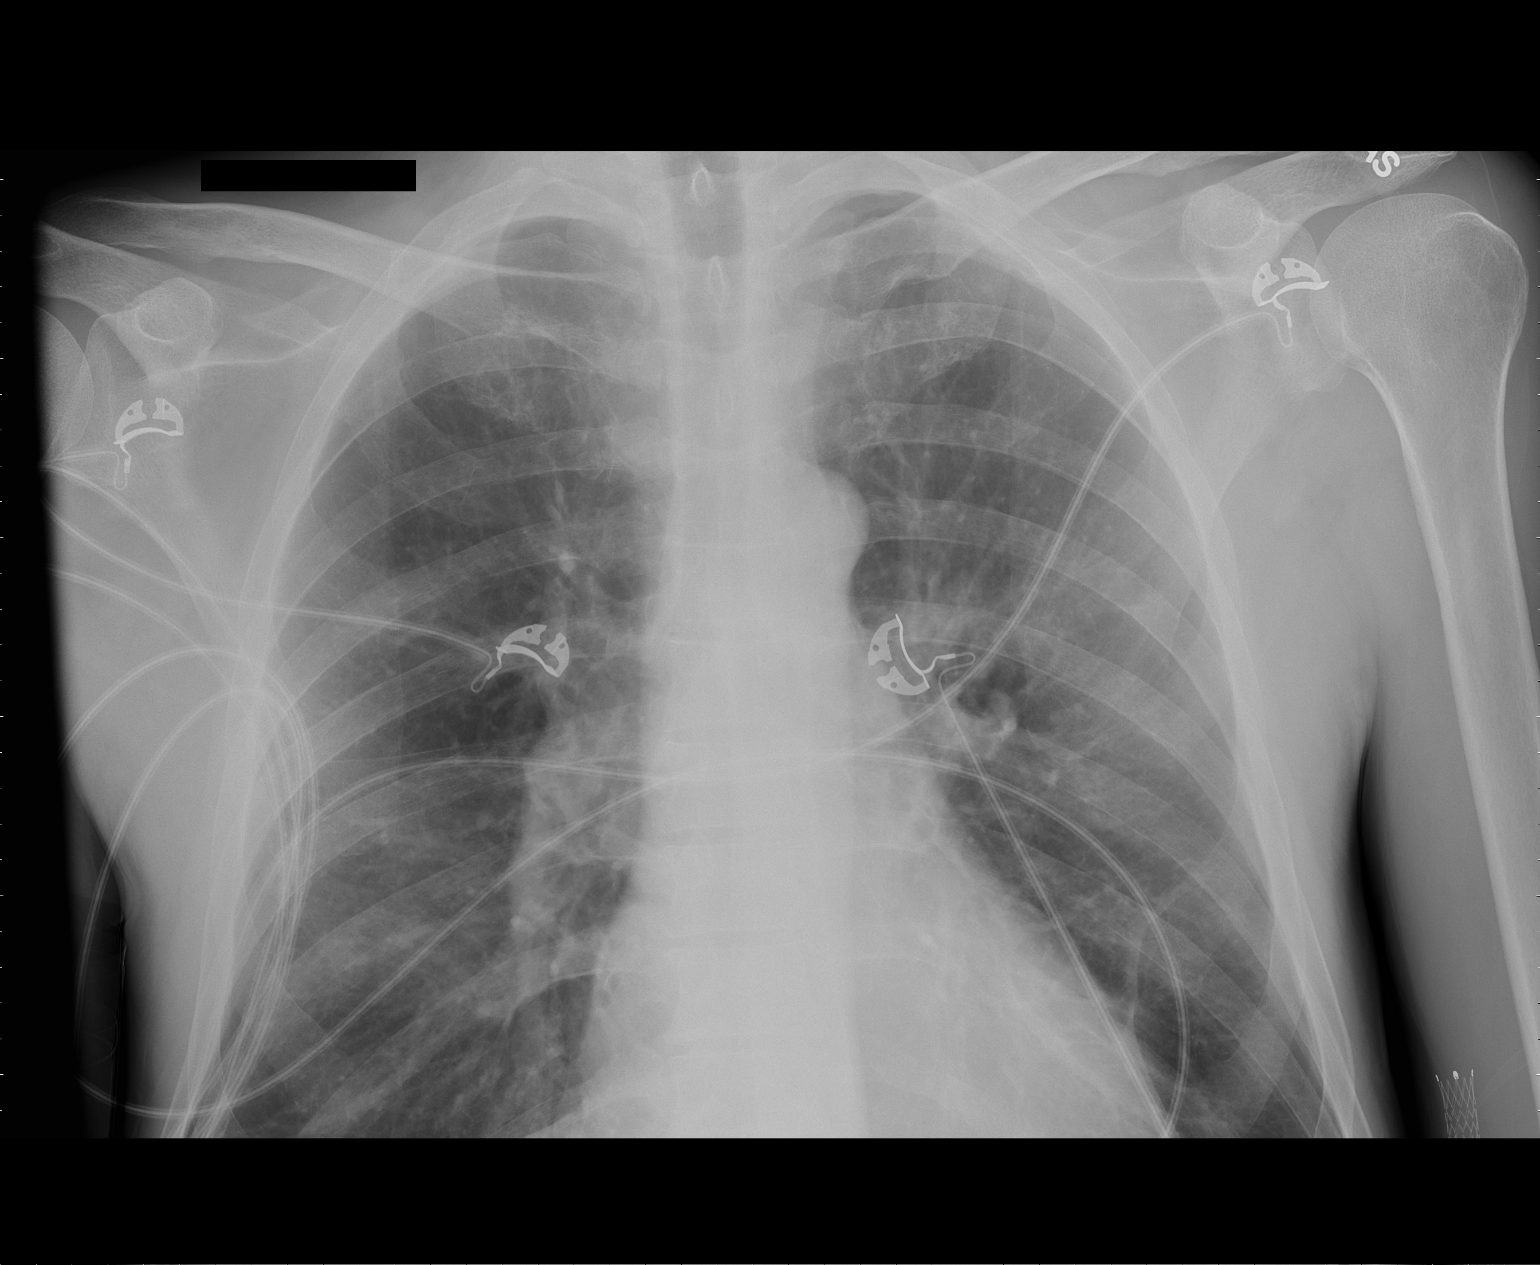

[1 of 1 positions shown; findings below may reference images not displayed]

FINDINGS: The cardiac silhouette, mediastinal and hilar contours
are within normal limits and stable.  The lungs are clear.  The
bony thorax is intact.
IMPRESSION: No acute cardiopulmonary findings.

## 2011-03-13 ENCOUNTER — Other Ambulatory Visit: Payer: Self-pay | Admitting: *Deleted

## 2011-03-13 IMAGING — CR DG CHEST 1V PORT
1 series · 1 of 1 positions shown · non-contrast
Comparison: 09/03/2010

CLINICAL DATA: Aspiration pneumonia.

PORTABLE CHEST - 1 VIEW

[view not recorded]
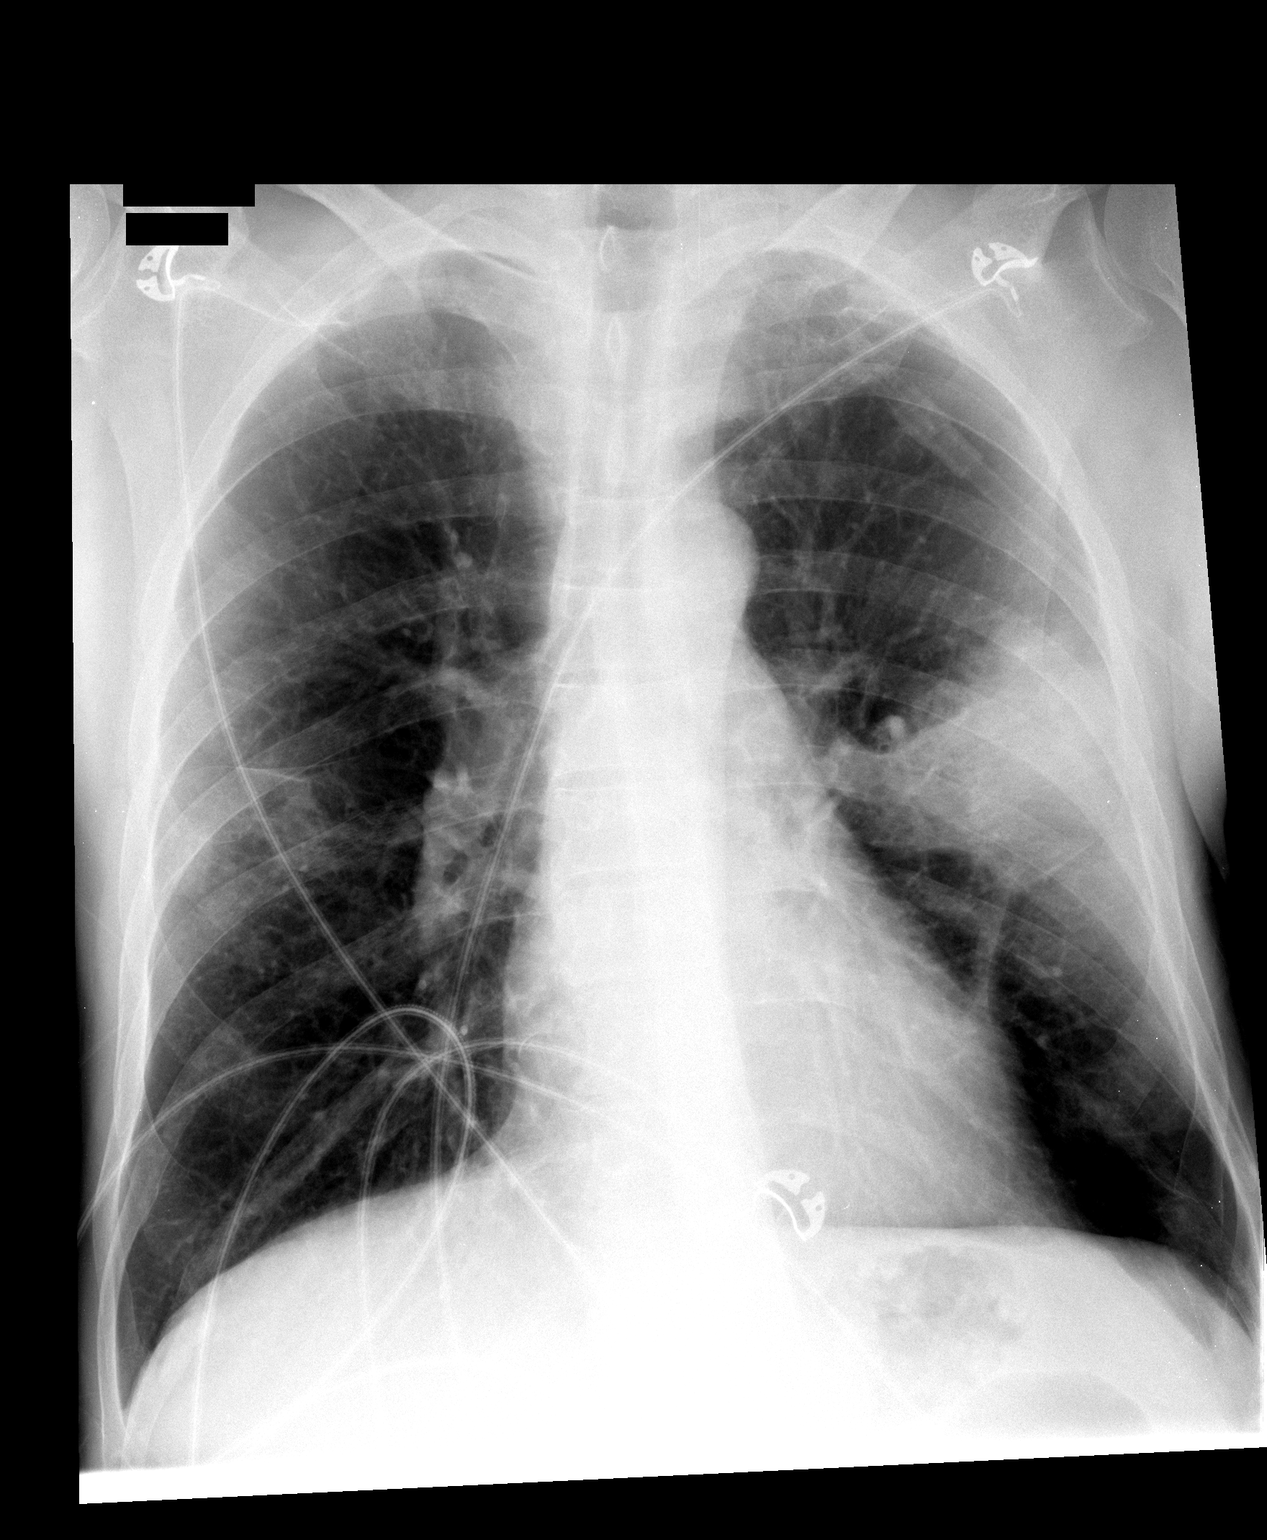

[1 of 1 positions shown; findings below may reference images not displayed]

FINDINGS: Single view of the chest demonstrates a new confluent
opacification in the left mid lung.  Right lung remains clear.
Heart and mediastinum are within normal limits.  Trachea is midline
and osseous structures are intact.
IMPRESSION: New confluent airspace opacification in the left mid lung region.
The location of this airspace disease could be in the superior
segment of the left lower lobe versus the left upper lobe.

This report was called the patient's nurse, Niafila, on 09/05/2010 at
[DATE] a.m.

## 2011-03-13 IMAGING — CR DG CHEST 1V PORT
1 series · 1 of 1 positions shown · non-contrast
Comparison: 09/05/2010

CLINICAL DATA: Shortness breath.  Worsening hypoxia and tachypnea.

PORTABLE CHEST - 1 VIEW

[view not recorded]
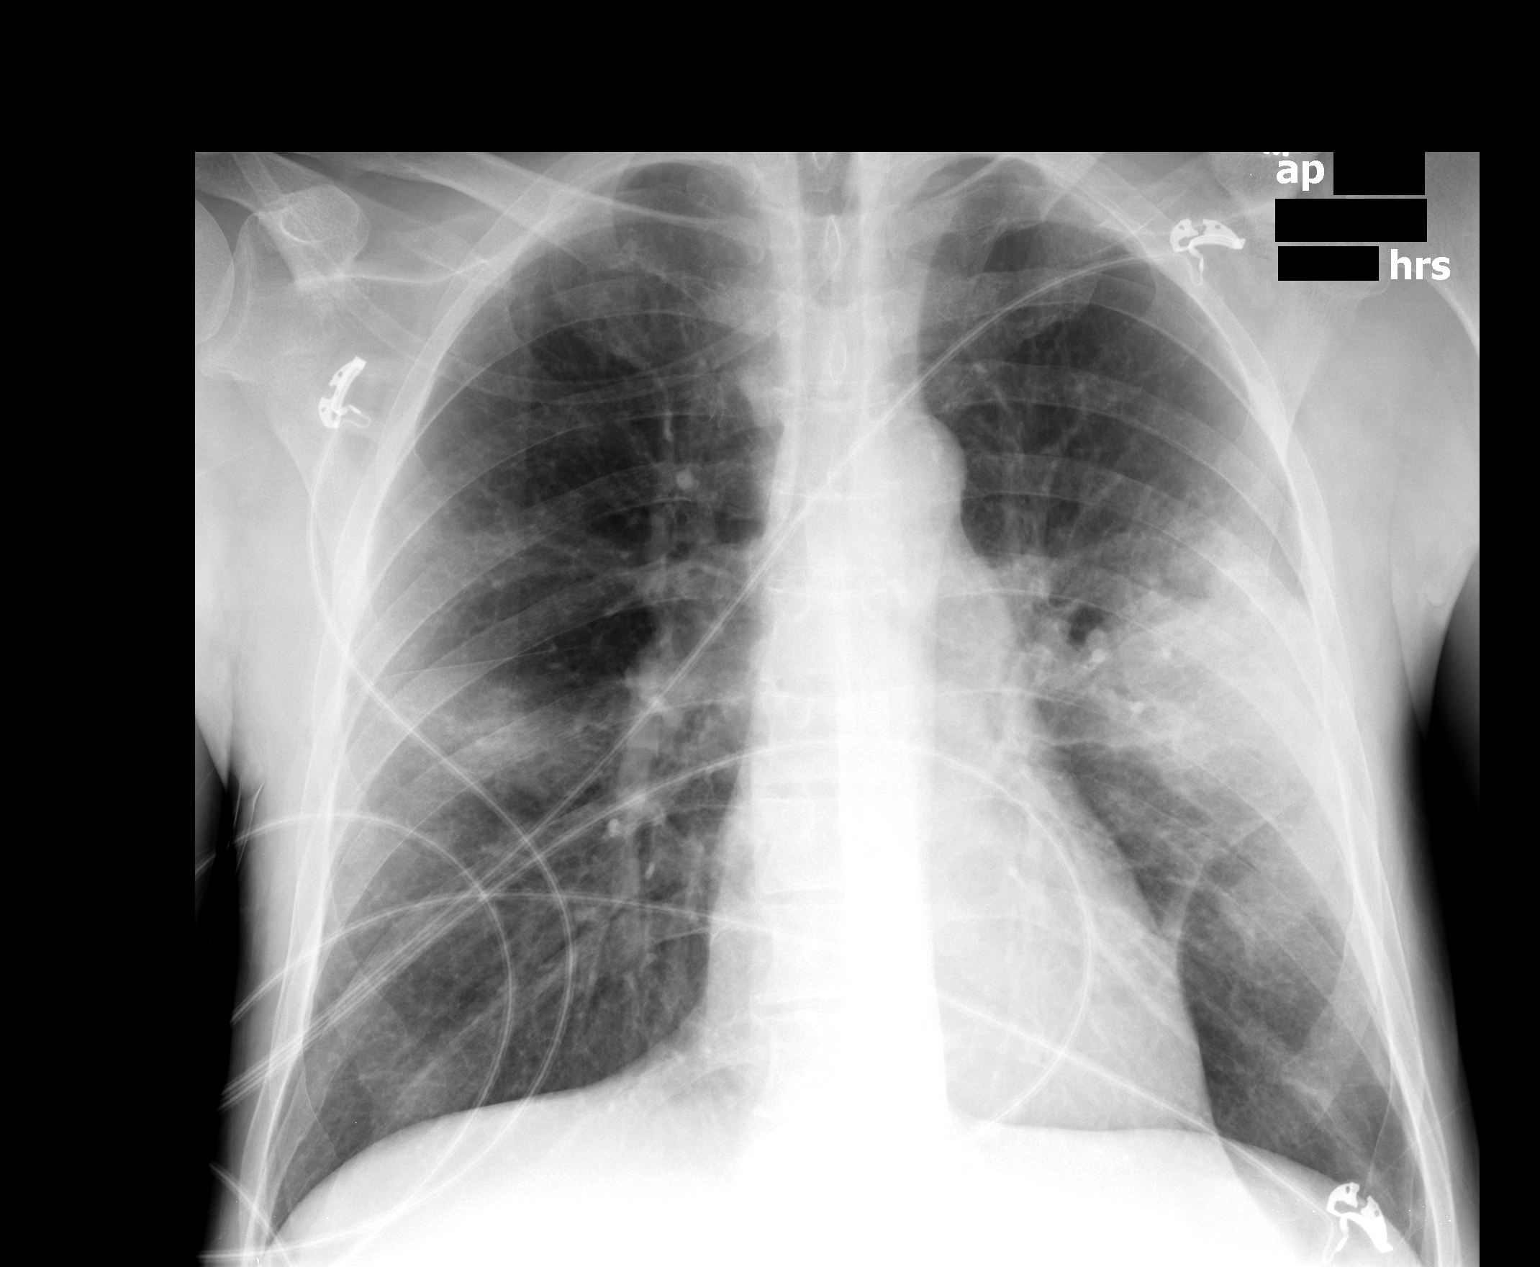

[1 of 1 positions shown; findings below may reference images not displayed]

FINDINGS: Confluent infiltrate in the lateral left midlung field
shows no significant interval change.

Mild increase in focal airspace opacity is seen in the right
midlung, suspicious worsening area of infiltrate.

No evidence of pleural effusion.  Heart size is normal.
IMPRESSION: 1.  No significant change in confluent infiltrate in the left mid
lung.
2.  Mild worsening of patchy airspace disease in the right mid
lung.

## 2011-03-13 MED ORDER — CLONIDINE HCL 0.2 MG PO TABS: 0.2000 mg | ORAL_TABLET | Freq: Two times a day (BID) | ORAL | Status: DC

## 2011-03-13 NOTE — Telephone Encounter (Signed)
Pt was placed discharged on this 02/02/2011.

## 2011-03-14 ENCOUNTER — Ambulatory Visit: Payer: Medicare Other | Admitting: Vascular Surgery

## 2011-03-14 IMAGING — CR DG CHEST 1V PORT
1 series · 1 of 1 positions shown · non-contrast
Comparison: 09/05/2010

CLINICAL DATA: Pneumonia.  Respiratory distress.  Intubation and
central line placement.

PORTABLE CHEST - 1 VIEW

[view not recorded]
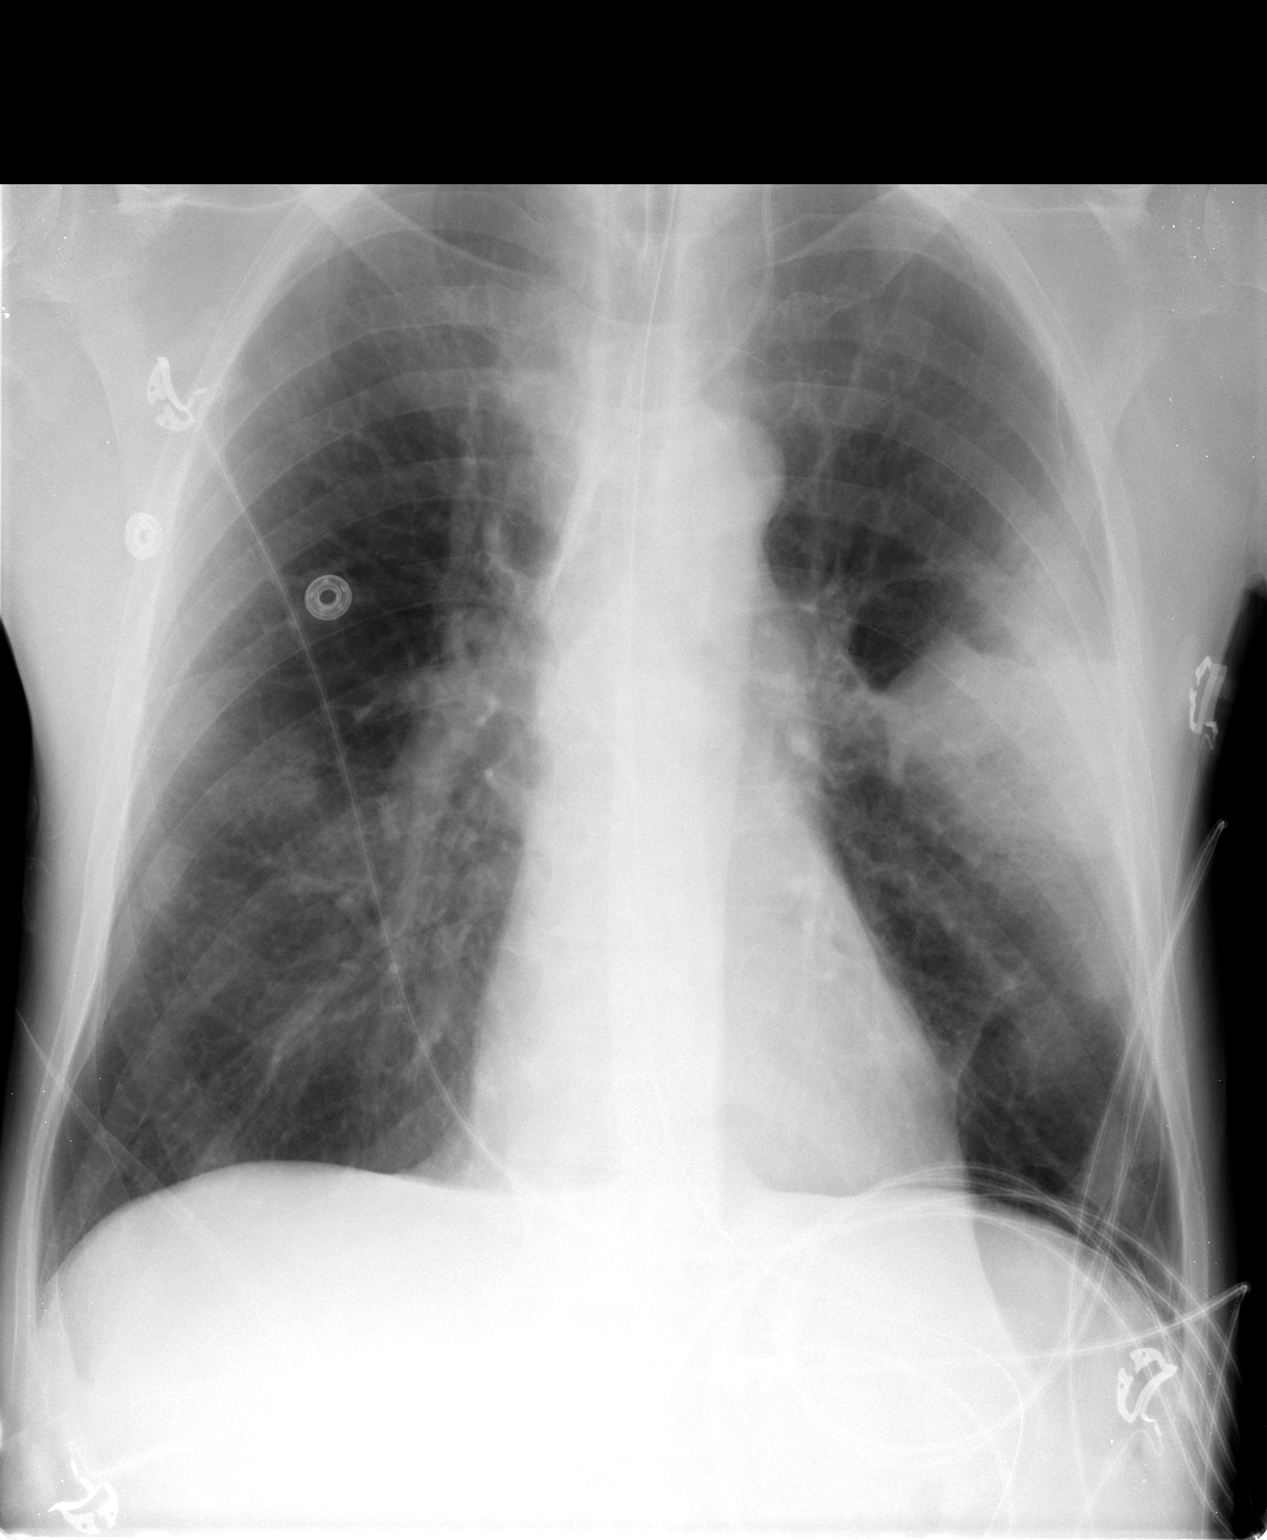

[1 of 1 positions shown; findings below may reference images not displayed]

FINDINGS: Endotracheal tube is seen with tip just above the level
of the clavicles, and approximately 8.5 cm above the carina.

New left jugular center venous catheter is seen with tip in the mid
SVC.  Nasogastric tube is seen entering the stomach.  No evidence
of pneumothorax.

Confluent infiltrate is again seen in the left midlung zone.  Mild
focal airspace disease is again seen in the right perihilar region.
These findings show no significant change.  Heart size is normal.
No evidence of pleural effusion.
IMPRESSION: 1.  High endotracheal tube position, with tip approximately 8.5 cm
above carina.
2.  Left jugular center venous catheter and nasogastric tube in
appropriate position.  No evidence of pneumothorax.
3.  Stable left mid lung confluent infiltrate, and mild airspace
disease in right midlung.

## 2011-03-14 NOTE — Telephone Encounter (Signed)
Clonidine was discontinued upon last Discharge on 02/22/11 due to orthostatic hypotension.

## 2011-03-15 IMAGING — CR DG CHEST 1V PORT
1 series · 1 of 1 positions shown · non-contrast
Comparison: 09/06/2010

CLINICAL DATA: Sepsis

PORTABLE CHEST - 1 VIEW

[AP]
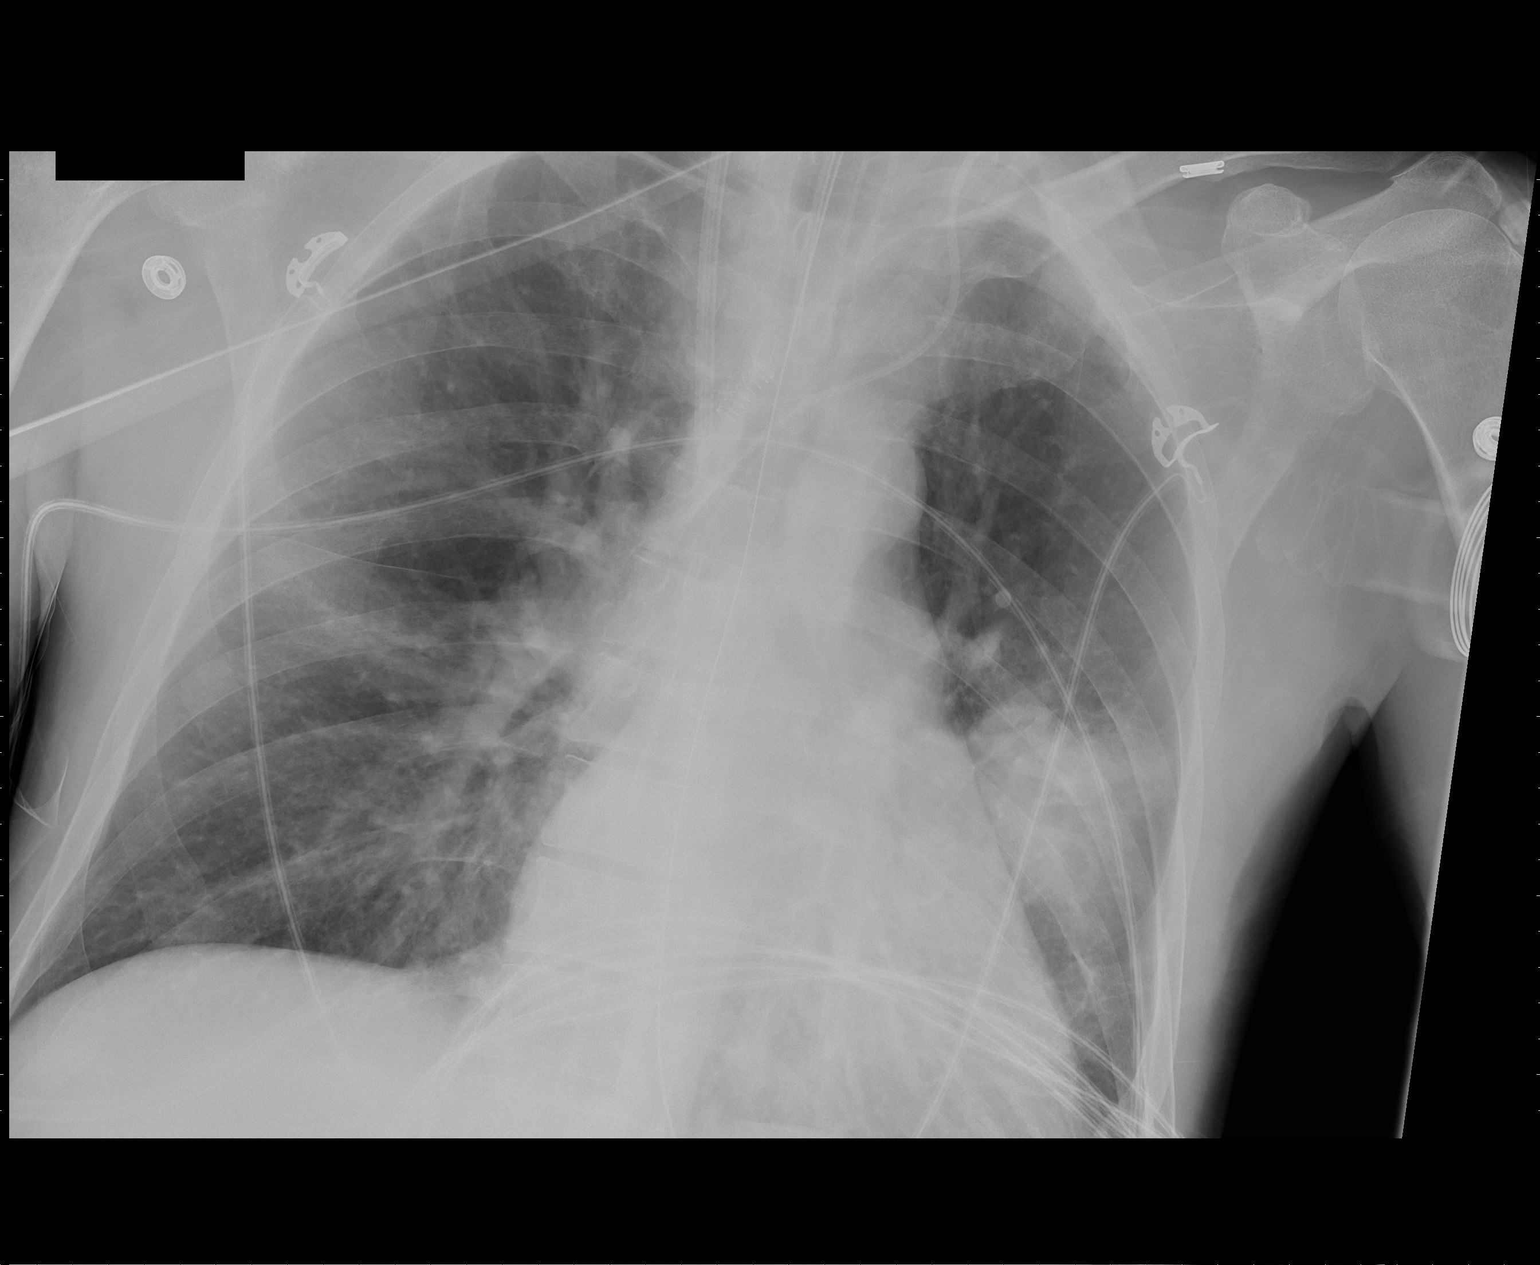

[1 of 1 positions shown; findings below may reference images not displayed]

FINDINGS: Heart size remains normal.  Left midlung mass stable.
Minimal density in the right mid lung about the same.  Support
apparatus unchanged.
IMPRESSION: No significant change.

## 2011-03-17 IMAGING — CR DG CHEST 1V PORT
1 series · 1 of 1 positions shown · non-contrast
Comparison: 09/07/2010.

CLINICAL DATA: Follow-up CHF.

PORTABLE CHEST - 1 VIEW

[view not recorded]
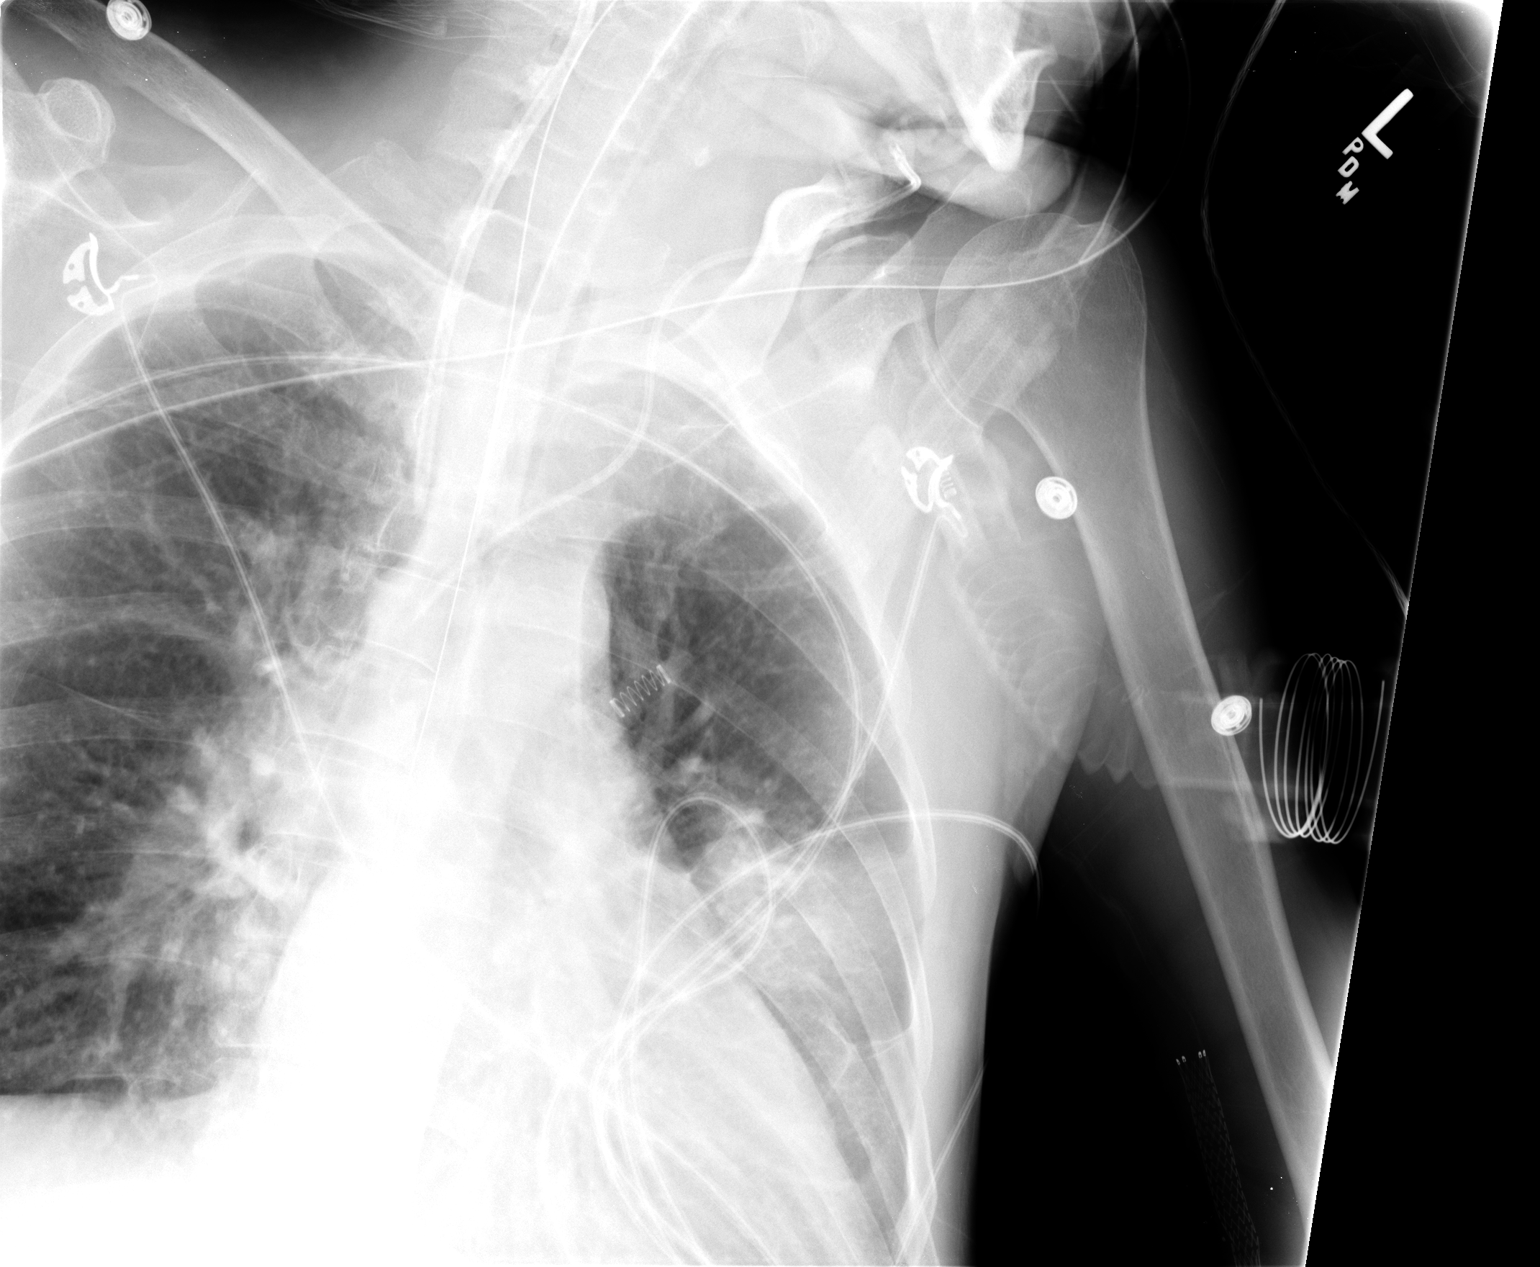

[1 of 1 positions shown; findings below may reference images not displayed]

FINDINGS: The endotracheal tube has been removed.  The Swan-Ganz
catheter has also been removed with the right jugular sheath
remaining in place.  Stable left jugular catheter.  The rounded
density in the left mid lung zone appears less dense and less well
defined.  Decreased linear  density in the right mid to lower lung
zone No corresponding density at that location on 06/12/2010.  The
lungs remain mildly hyperexpanded with stable mild prominence of
the interstitial markings and minimal central peribronchial
thickening.  The cardiac silhouette remains mildly enlarged.
Unremarkable bones.
IMPRESSION: 1.  Improving probable rounded pneumonia in the left mid to lower
lung zone.
2.  Decreased atelectasis in the right mid lung zone.
3.  Stable mild cardiomegaly and mild changes of COPD and chronic
bronchitis.

## 2011-03-18 IMAGING — CR DG CHEST 1V PORT
2 series · 2 of 2 positions shown · non-contrast
Comparison: [DATE]

CLINICAL DATA: Chest pain.

PORTABLE CHEST - 1 VIEW

[AP (1 of 2)]
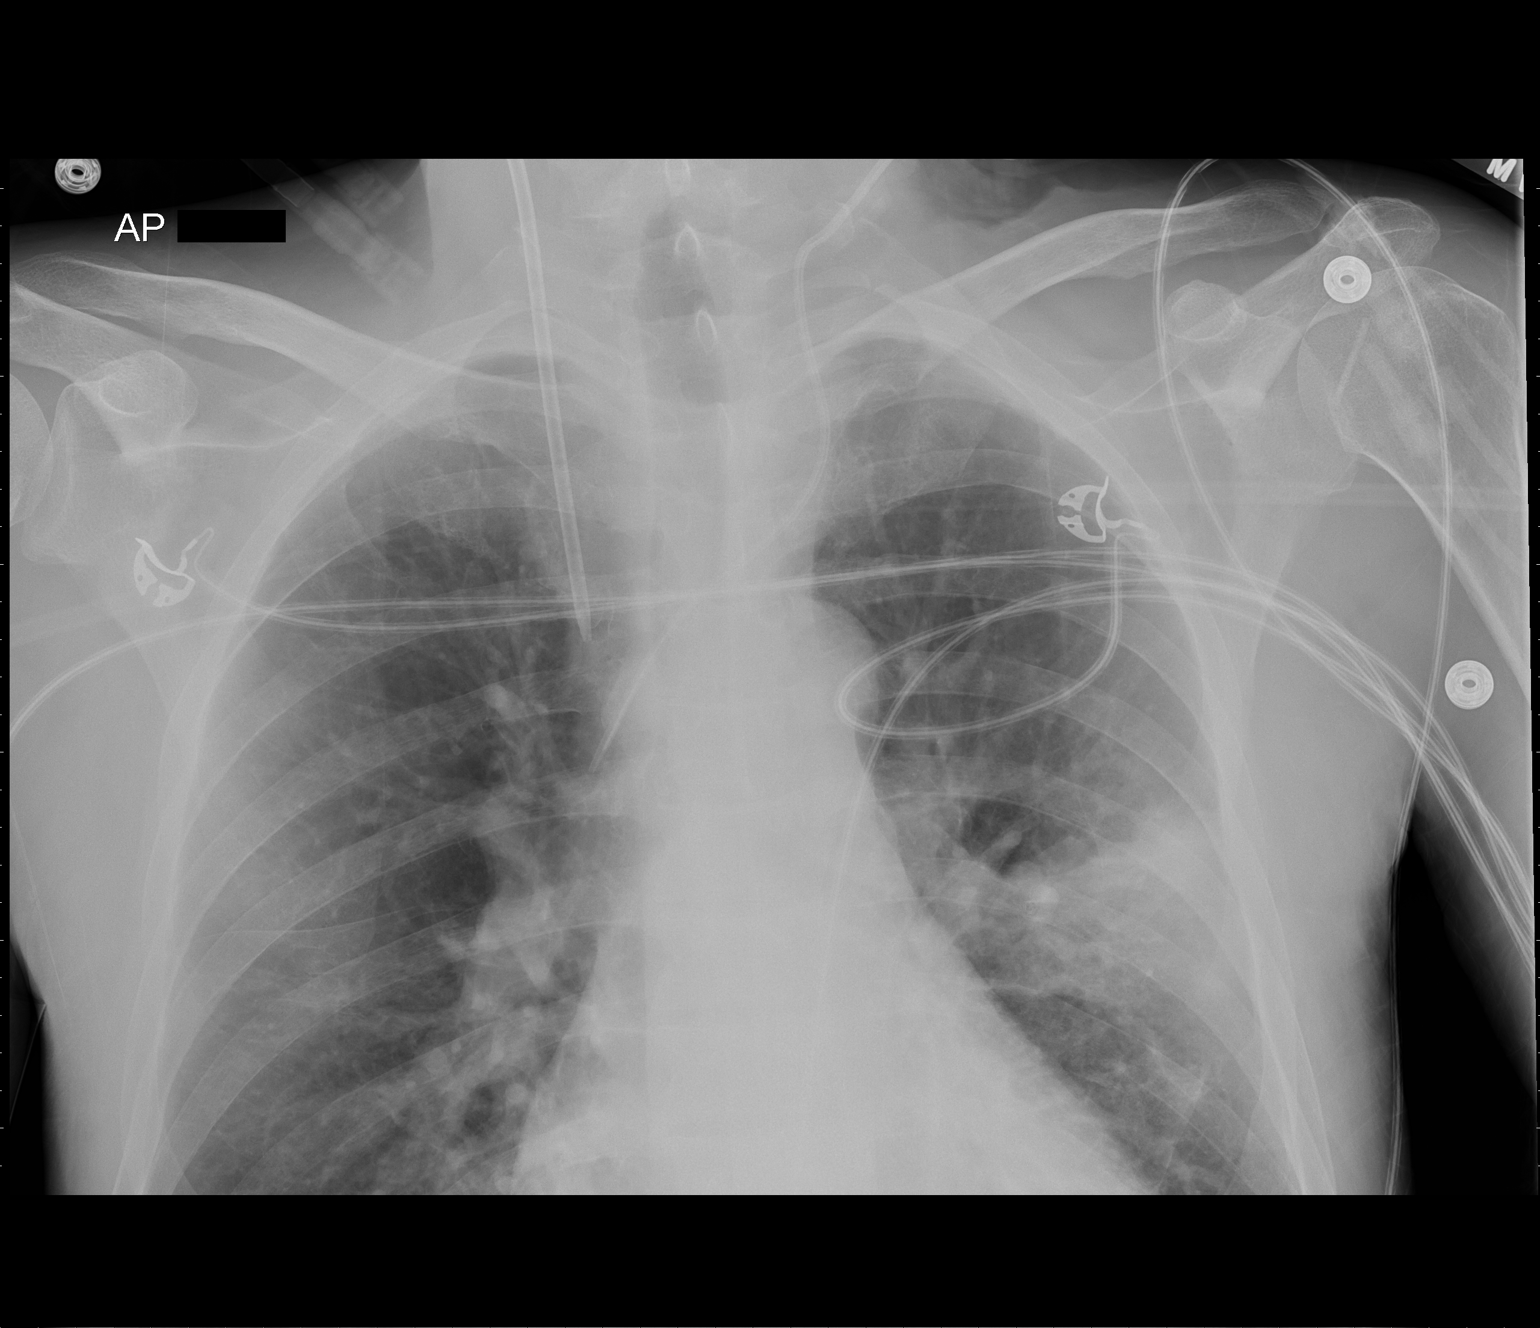

[AP (2 of 2)]
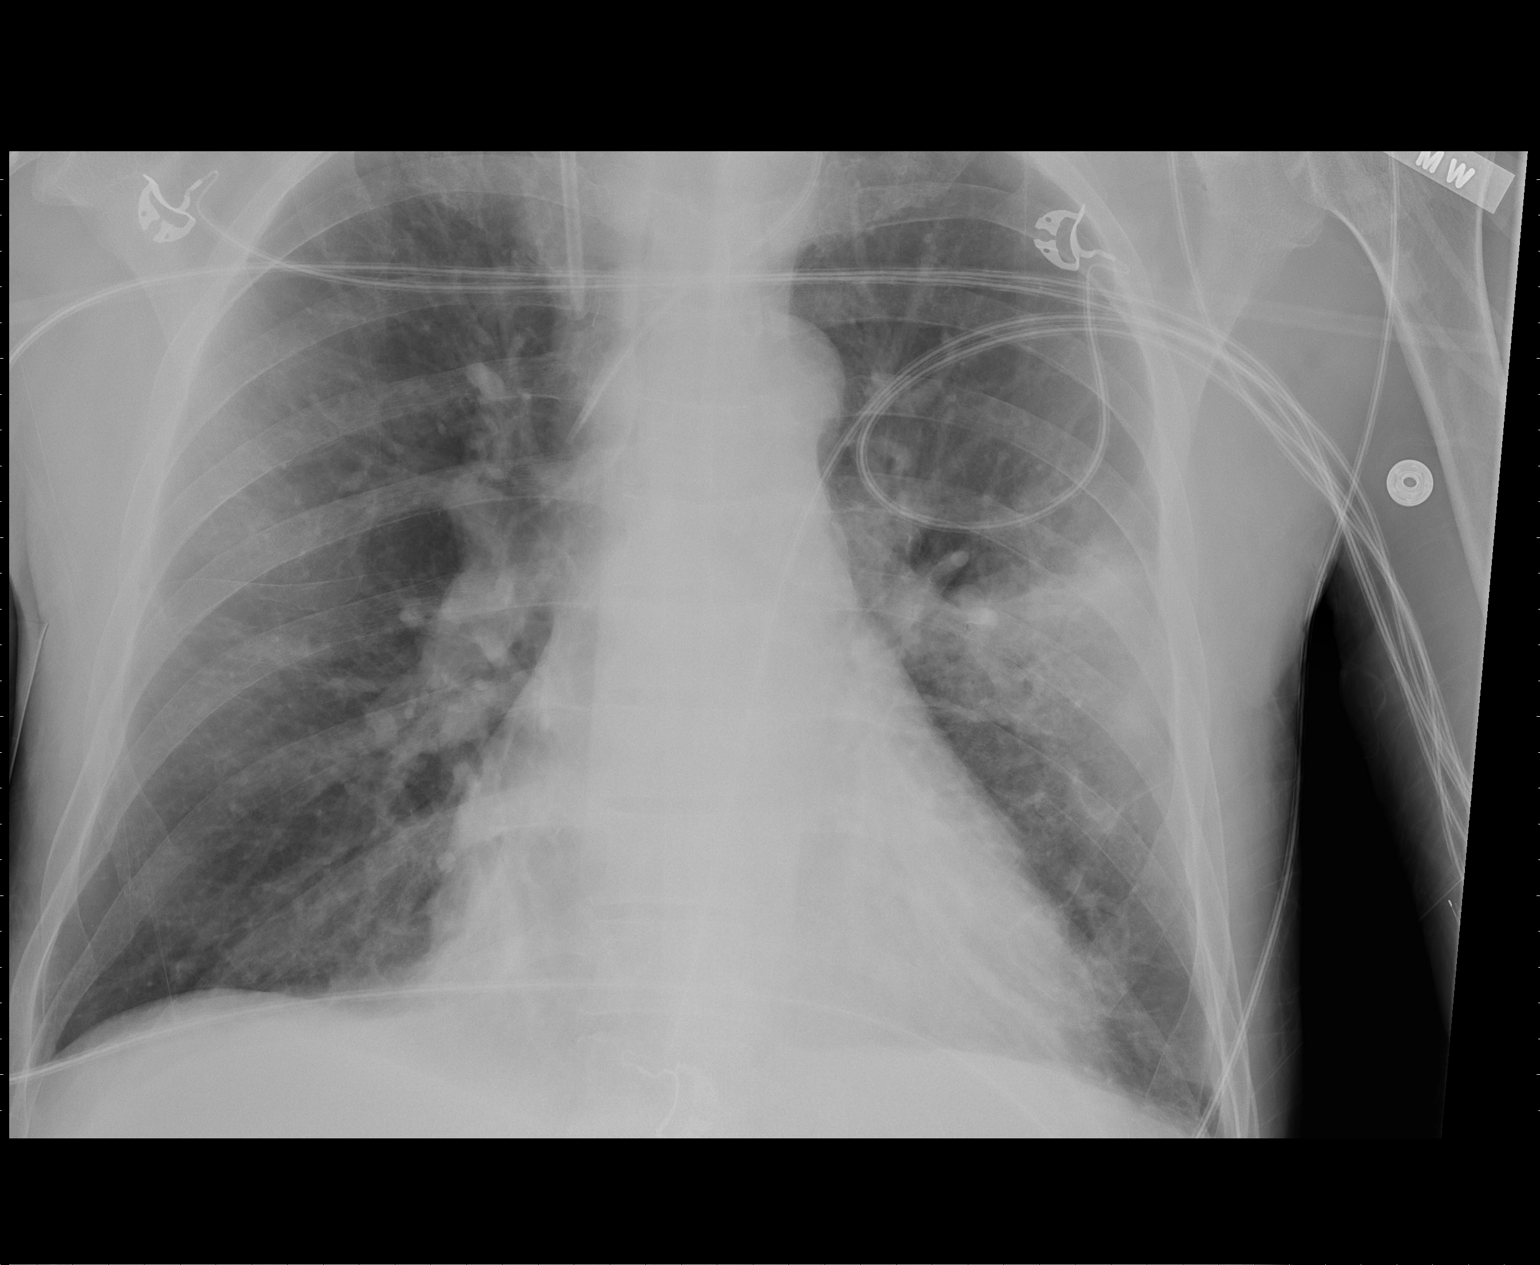

[2 of 2 positions shown; findings below may reference images not displayed]

FINDINGS: AP views of the chest demonstrate a right jugular central
venous catheter.  Catheter tip is in the right innominate vein
region.  There is a left jugular central line overlying the upper
SVC.  Persistent opacification in the left mid lung suggestive for
airspace disease.  No evidence of pulmonary edema.  Negative for
pneumothorax.  Heart size is stable.  Trachea is midline.  There is
a round lucency along the superior aspect of the left lung airspace
disease.  Findings are nonspecific and cannot exclude a small area
of cavitation.
IMPRESSION: Minimal change in the left lung airspace disease.  Cannot exclude
lucency within the airspace disease.

Stable appearance of the central lines.

## 2011-03-21 ENCOUNTER — Ambulatory Visit: Payer: Medicare Other | Admitting: Vascular Surgery

## 2011-04-03 ENCOUNTER — Encounter: Payer: Self-pay | Admitting: Ophthalmology

## 2011-04-03 ENCOUNTER — Emergency Department (HOSPITAL_COMMUNITY): Payer: Medicare Other

## 2011-04-03 ENCOUNTER — Inpatient Hospital Stay (HOSPITAL_COMMUNITY)
Admission: EM | Admit: 2011-04-03 | Discharge: 2011-04-12 | DRG: 423 | Disposition: A | Discharge status: Home / Self Care | Payer: Medicare Other | Attending: Internal Medicine | Admitting: Internal Medicine

## 2011-04-03 DIAGNOSIS — F17200 Nicotine dependence, unspecified, uncomplicated: Secondary | ICD-10-CM | POA: Diagnosis present

## 2011-04-03 DIAGNOSIS — T82898A Other specified complication of vascular prosthetic devices, implants and grafts, initial encounter: Secondary | ICD-10-CM | POA: Diagnosis not present

## 2011-04-03 DIAGNOSIS — N2581 Secondary hyperparathyroidism of renal origin: Secondary | ICD-10-CM | POA: Diagnosis present

## 2011-04-03 DIAGNOSIS — E162 Hypoglycemia, unspecified: Secondary | ICD-10-CM | POA: Diagnosis present

## 2011-04-03 DIAGNOSIS — E8779 Other fluid overload: Secondary | ICD-10-CM | POA: Diagnosis present

## 2011-04-03 DIAGNOSIS — K861 Other chronic pancreatitis: Secondary | ICD-10-CM | POA: Diagnosis present

## 2011-04-03 DIAGNOSIS — E872 Acidosis, unspecified: Secondary | ICD-10-CM | POA: Diagnosis present

## 2011-04-03 DIAGNOSIS — N186 End stage renal disease: Secondary | ICD-10-CM | POA: Diagnosis present

## 2011-04-03 DIAGNOSIS — Z992 Dependence on renal dialysis: Secondary | ICD-10-CM

## 2011-04-03 DIAGNOSIS — F411 Generalized anxiety disorder: Secondary | ICD-10-CM | POA: Diagnosis present

## 2011-04-03 DIAGNOSIS — K859 Acute pancreatitis without necrosis or infection, unspecified: Principal | ICD-10-CM | POA: Diagnosis present

## 2011-04-03 DIAGNOSIS — Z9115 Patient's noncompliance with renal dialysis: Secondary | ICD-10-CM

## 2011-04-03 DIAGNOSIS — F1020 Alcohol dependence, uncomplicated: Secondary | ICD-10-CM | POA: Diagnosis present

## 2011-04-03 DIAGNOSIS — K2920 Alcoholic gastritis without bleeding: Secondary | ICD-10-CM | POA: Diagnosis present

## 2011-04-03 DIAGNOSIS — E871 Hypo-osmolality and hyponatremia: Secondary | ICD-10-CM | POA: Diagnosis present

## 2011-04-03 DIAGNOSIS — J449 Chronic obstructive pulmonary disease, unspecified: Secondary | ICD-10-CM | POA: Diagnosis present

## 2011-04-03 DIAGNOSIS — B1920 Unspecified viral hepatitis C without hepatic coma: Secondary | ICD-10-CM | POA: Diagnosis present

## 2011-04-03 DIAGNOSIS — I120 Hypertensive chronic kidney disease with stage 5 chronic kidney disease or end stage renal disease: Secondary | ICD-10-CM | POA: Diagnosis present

## 2011-04-03 DIAGNOSIS — Z79899 Other long term (current) drug therapy: Secondary | ICD-10-CM

## 2011-04-03 DIAGNOSIS — Z91158 Patient's noncompliance with renal dialysis for other reason: Secondary | ICD-10-CM

## 2011-04-03 DIAGNOSIS — E875 Hyperkalemia: Secondary | ICD-10-CM | POA: Diagnosis present

## 2011-04-03 DIAGNOSIS — J690 Pneumonitis due to inhalation of food and vomit: Secondary | ICD-10-CM | POA: Diagnosis present

## 2011-04-03 DIAGNOSIS — J4489 Other specified chronic obstructive pulmonary disease: Secondary | ICD-10-CM | POA: Diagnosis present

## 2011-04-03 DIAGNOSIS — Y832 Surgical operation with anastomosis, bypass or graft as the cause of abnormal reaction of the patient, or of later complication, without mention of misadventure at the time of the procedure: Secondary | ICD-10-CM | POA: Diagnosis not present

## 2011-04-03 LAB — POCT I-STAT, CHEM 8
BUN: 50 mg/dL — ABNORMAL HIGH (ref 6–23)
Calcium, Ion: 0.96 mmol/L — ABNORMAL LOW (ref 1.12–1.32)
Chloride: 102 mEq/L (ref 96–112)
Potassium: 5.9 mEq/L — ABNORMAL HIGH (ref 3.5–5.1)

## 2011-04-03 LAB — PROTIME-INR
INR: 0.94 (ref 0.00–1.49)
Prothrombin Time: 12.8 seconds (ref 11.6–15.2)

## 2011-04-03 LAB — DIFFERENTIAL
Eosinophils Absolute: 0 10*3/uL (ref 0.0–0.7)
Lymphocytes Relative: 29 % (ref 12–46)
Lymphs Abs: 1.2 10*3/uL (ref 0.7–4.0)
Monocytes Relative: 7 % (ref 3–12)
Neutro Abs: 2.7 10*3/uL (ref 1.7–7.7)
Neutrophils Relative %: 63 % (ref 43–77)

## 2011-04-03 LAB — COMPREHENSIVE METABOLIC PANEL
BUN: 47 mg/dL — ABNORMAL HIGH (ref 6–23)
CO2: 13 mEq/L — ABNORMAL LOW (ref 19–32)
Calcium: 7.8 mg/dL — ABNORMAL LOW (ref 8.4–10.5)
Chloride: 92 mEq/L — ABNORMAL LOW (ref 96–112)
Creatinine, Ser: 7.95 mg/dL — ABNORMAL HIGH (ref 0.50–1.35)
GFR calc Af Amer: 9 mL/min — ABNORMAL LOW (ref >60)
GFR calc non Af Amer: 7 mL/min — ABNORMAL LOW (ref >60)
Glucose, Bld: 61 mg/dL — ABNORMAL LOW (ref 70–99)
Total Bilirubin: 0.3 mg/dL (ref 0.3–1.2)

## 2011-04-03 LAB — CBC
HCT: 27.8 % — ABNORMAL LOW (ref 39.0–52.0)
Hemoglobin: 9.2 g/dL — ABNORMAL LOW (ref 13.0–17.0)
MCH: 31.3 pg (ref 26.0–34.0)
MCV: 94.6 fL (ref 78.0–100.0)
RBC: 2.94 MIL/uL — ABNORMAL LOW (ref 4.22–5.81)
WBC: 4.3 10*3/uL (ref 4.0–10.5)

## 2011-04-03 IMAGING — CR DG CHEST 1V PORT
1 series · 1 of 1 positions shown · non-contrast
Comparison: 09/26/2010.

CLINICAL DATA: Line placement.

PORTABLE CHEST - 1 VIEW

[AP]
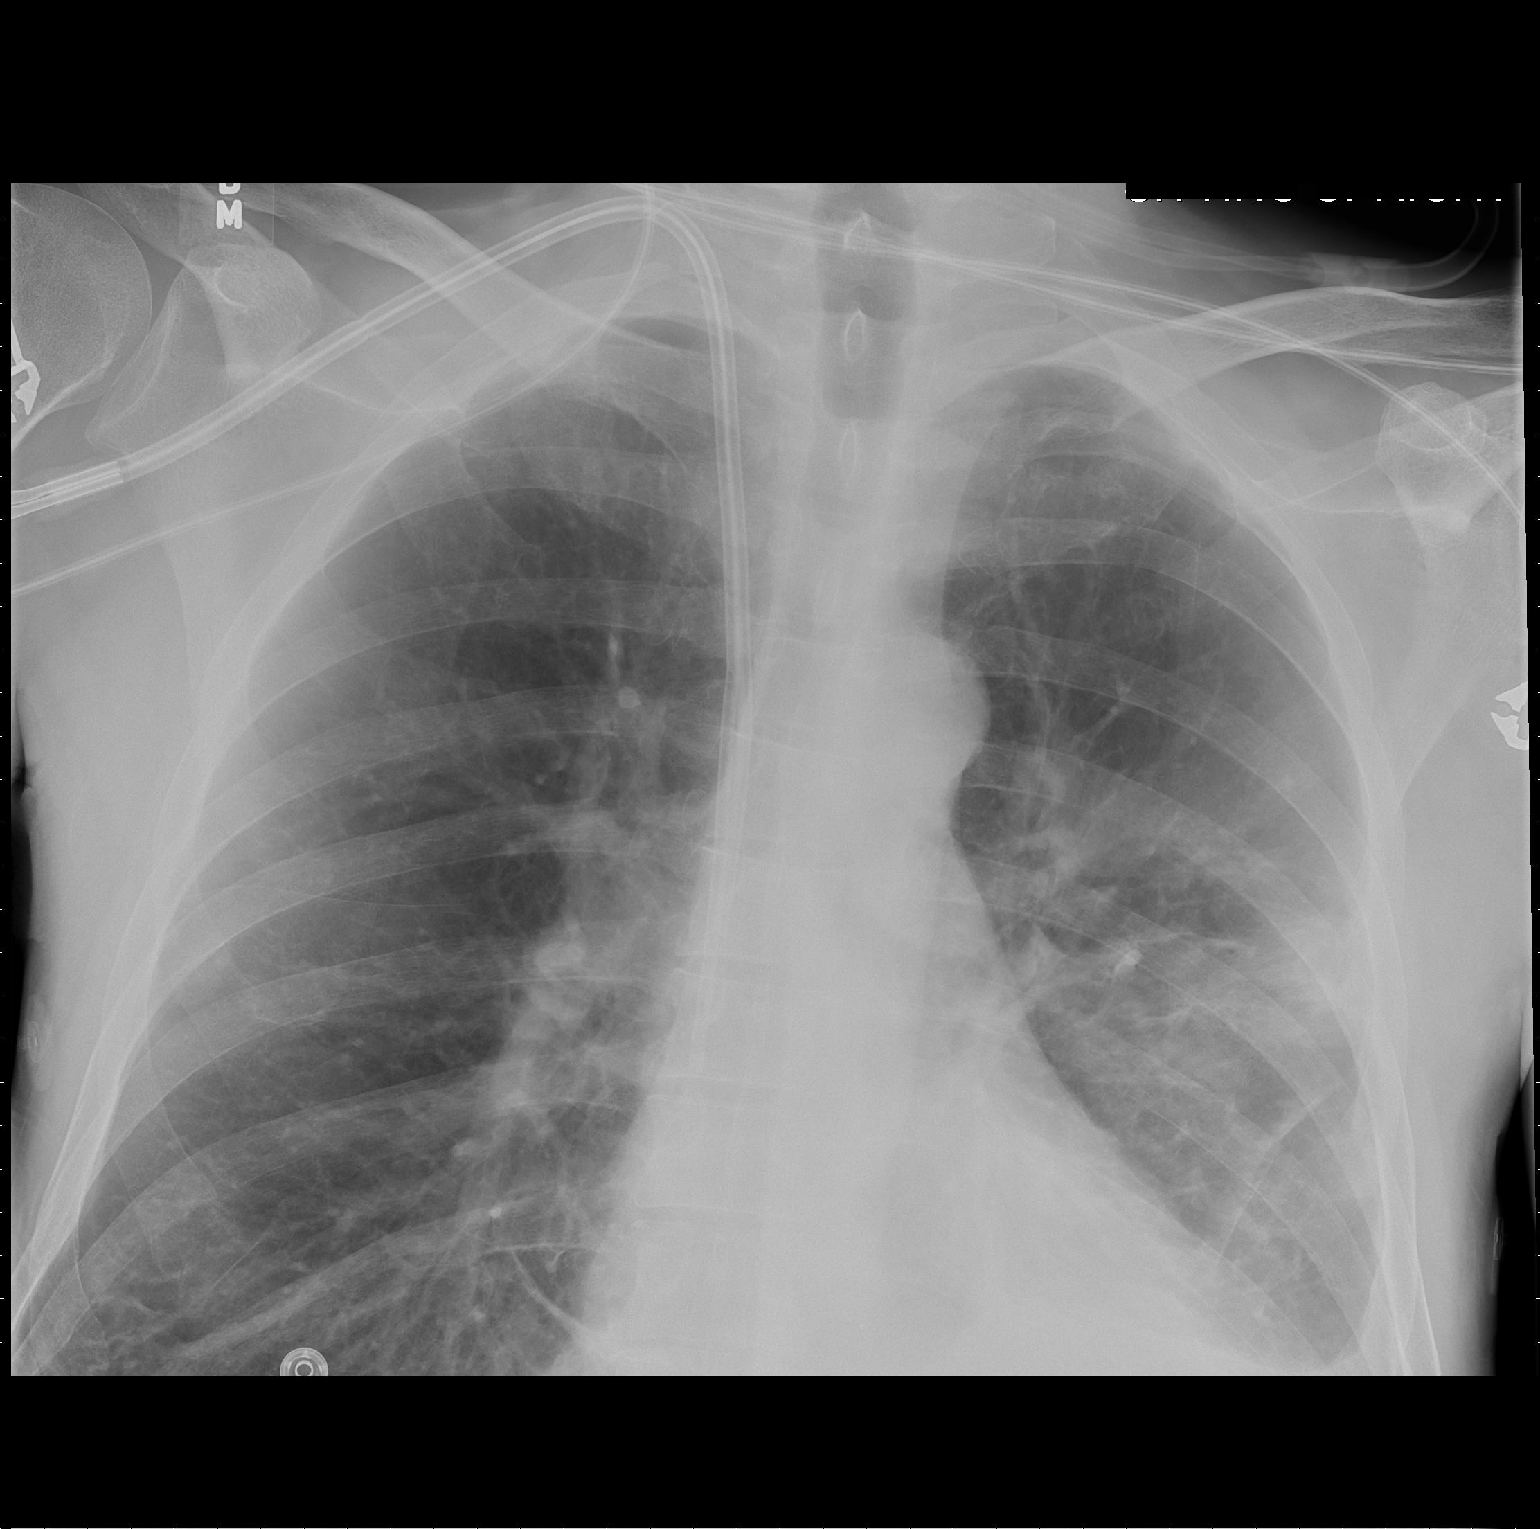

[1 of 1 positions shown; findings below may reference images not displayed]

FINDINGS: Right central line has been placed.  Tips in the region
of the mid to distal superior vena cava.  No gross pneumothorax.

Layering of left-sided pleural effusion.  Peripheral  consolidation
left mid lung.  Recommend follow-up until clearance.

Heart size top normal.

Central pulmonary vascular prominence.
IMPRESSION: Right central line has been placed.  Tips in the region of the mid
to distal superior vena cava.  No gross pneumothorax.

## 2011-04-03 IMAGING — CR DG CHEST 2V
2 series · 2 of 2 positions shown · non-contrast
Comparison: 09/10/2010

CLINICAL DATA: Follow up pneumonia

CHEST - 2 VIEW

[view not recorded (1 of 2)]
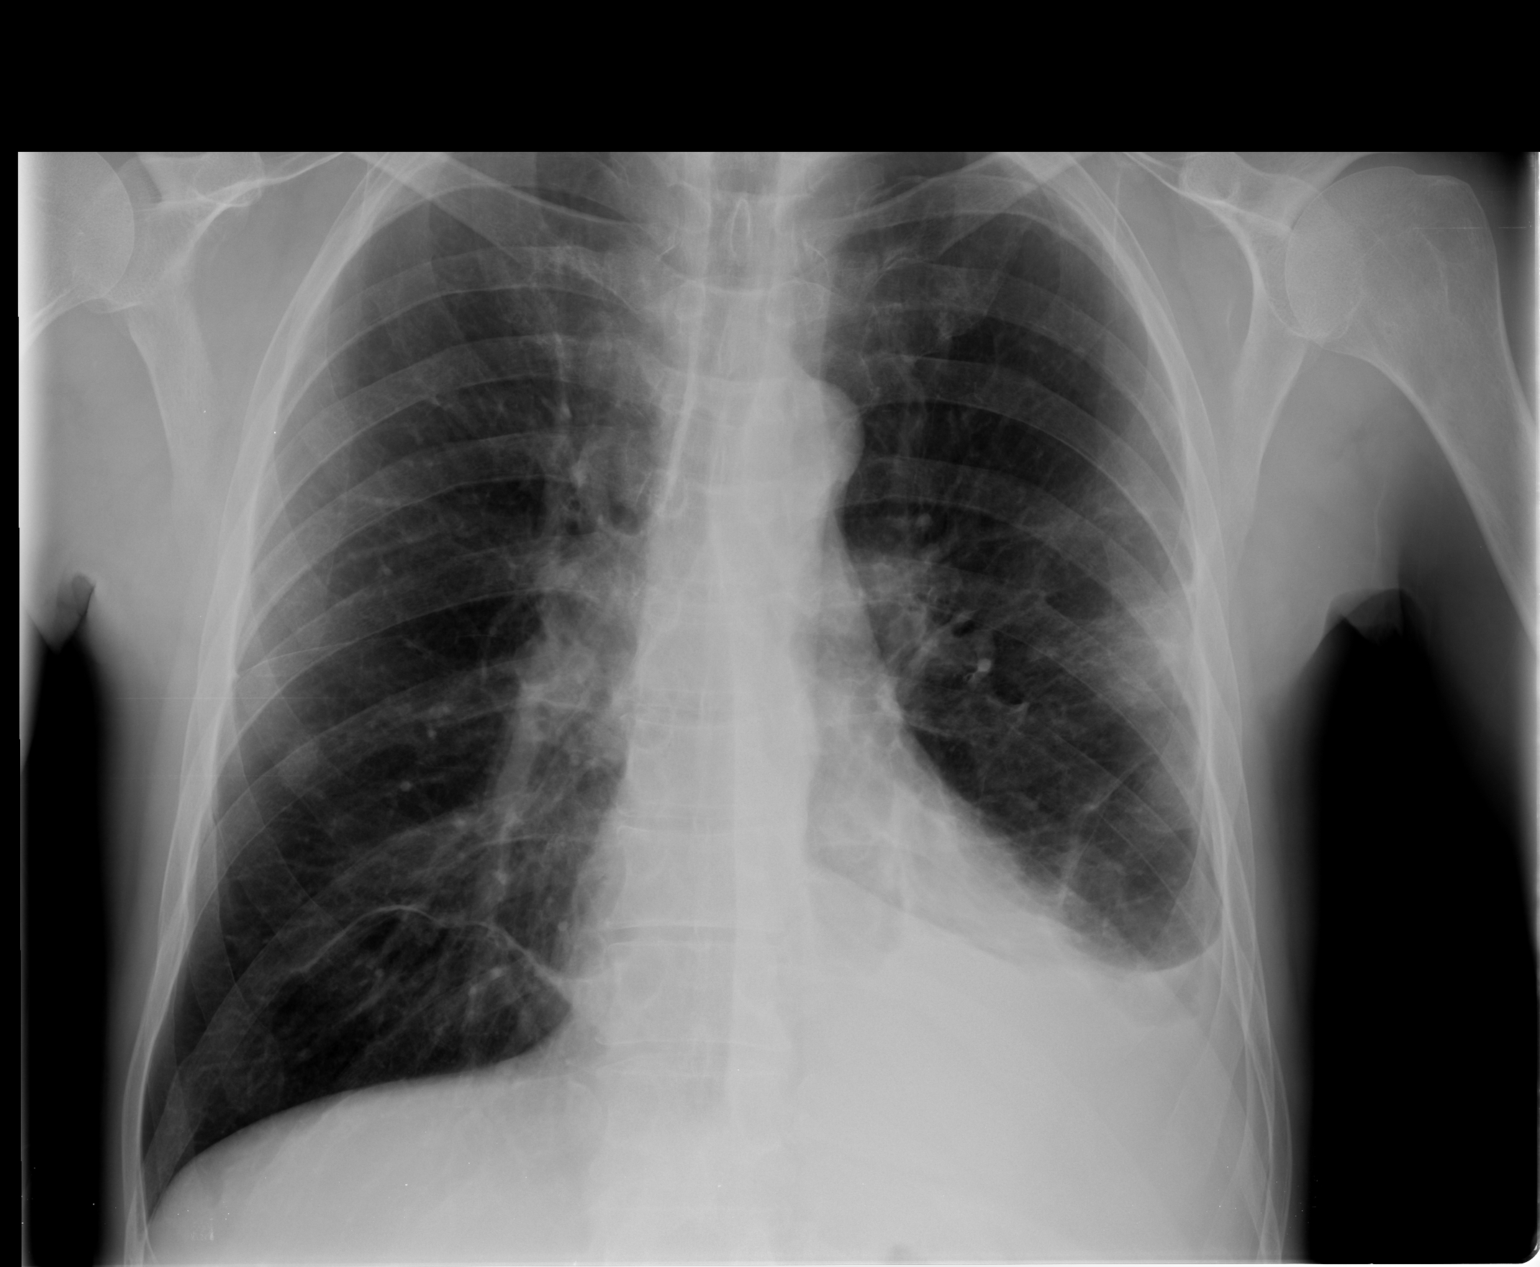

[view not recorded (2 of 2)]
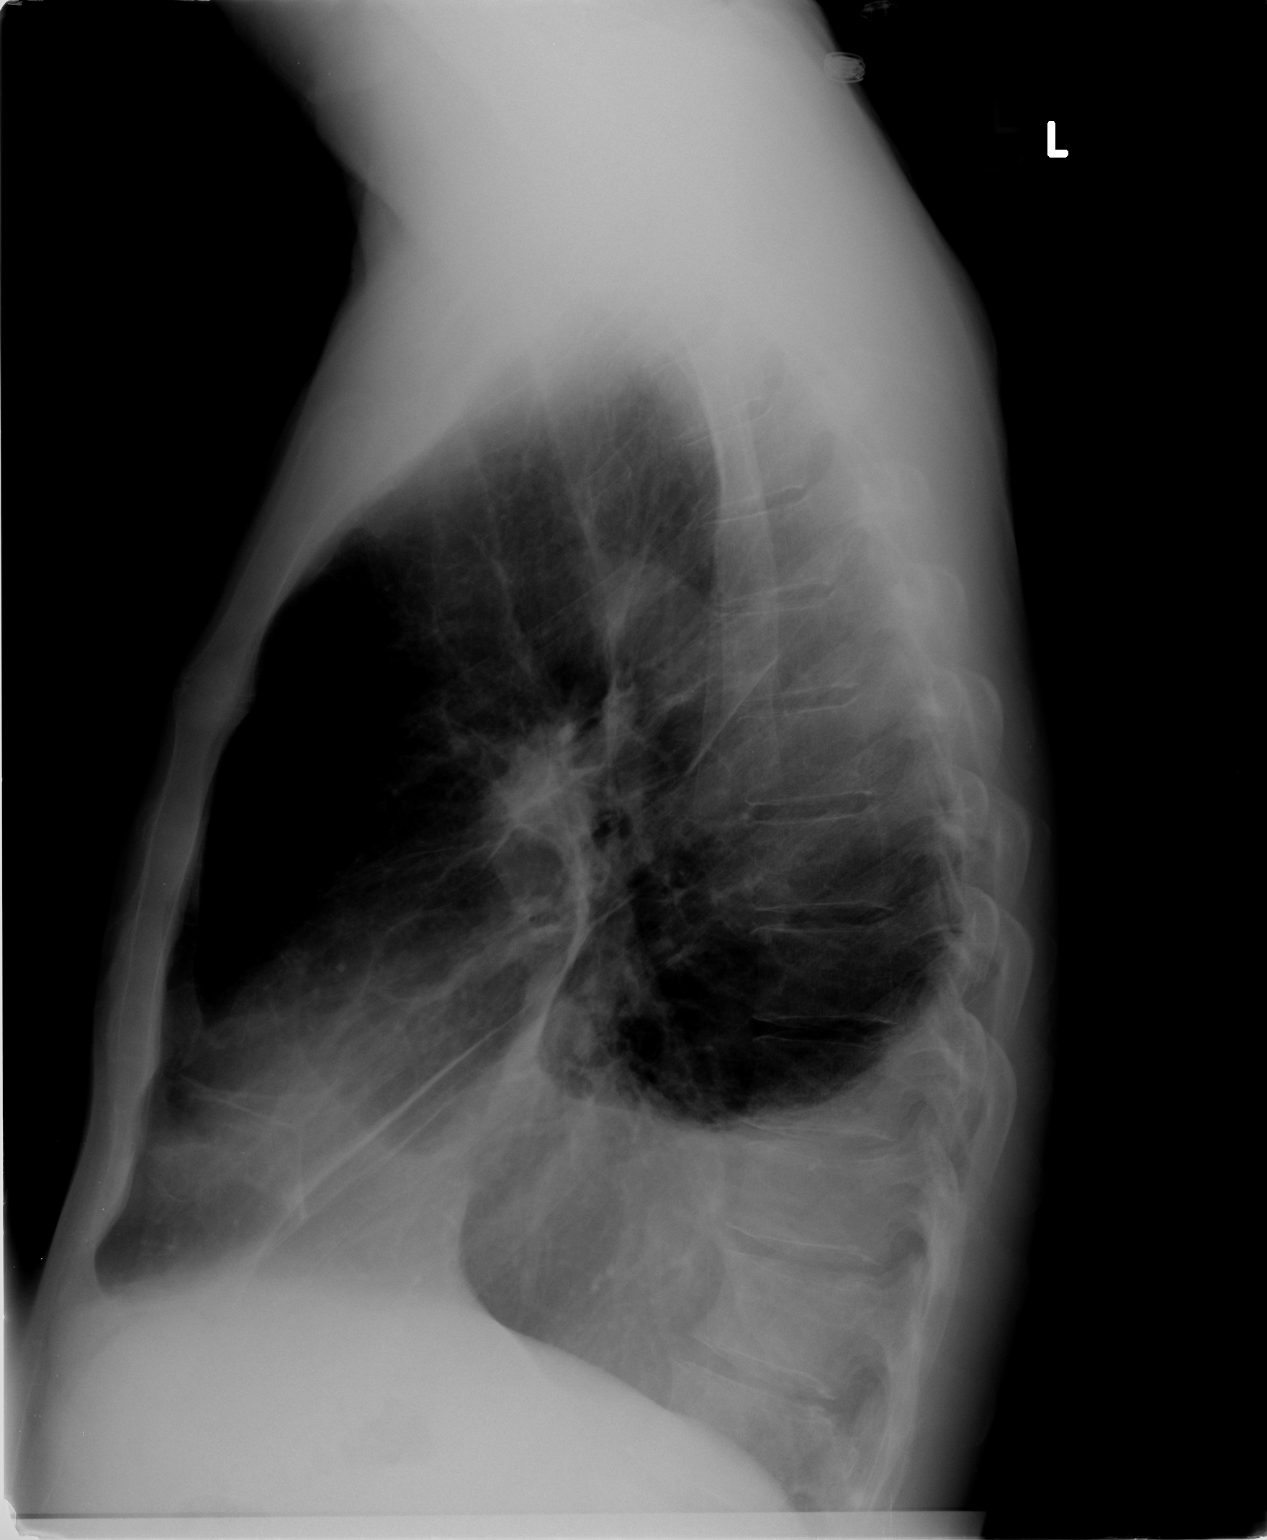

[2 of 2 positions shown; findings below may reference images not displayed]

FINDINGS: Improvement and left lower lobe infiltrate.  Interval
development of left pleural effusion and left lower lobe
atelectasis.  Linear atelectasis in the right lower lobe.

Nodular density overlying the right anterior fifth rib, most
compatible with healing rib fracture.  This has been present
previously and are other rib fractures also present on the right.

COPD
IMPRESSION: Improving pneumonia on the left with persistent left lower lobe
density.  Interval development of left effusion and left lower lobe
atelectasis.  Continued follow-up until clearing is suggested.

## 2011-04-03 NOTE — H&P (Signed)
Hospital Admission Note Date: 04/04/2011  Patient name: [name] Medical record number: [id number] Date of birth: 07/13/1956 Age: 55 y.o. Gender: male PCP: Melida Quitter, MD  Medical Service: Internal Medicine Teaching Service  Attending physician: Dr. Aundria Rud     Resident (R2/R3): Dr.Tobbia    Pager: [phone] Resident (R1): Dr. Milbert Coulter     Pager: [phone]  Chief Complaint: abdominal pain, vomiting, diarrhea, leg/abdominal edema  History of Present Illness: Patient is a 55 year old man with a history of multiple admissions for alcoholic pancreatitis and alcoholic gastritis as well as ESRD who presents with 1 day of abdominal pain, diarrhea, and vomiting. He reports that there was bright red blood present in the diarrhea though he denies hematemesis. He reports taking his PPI daily and denies any aspirin/NSAID use. EGD done on prior admisision revealed duodenitis, esophagitis, mild gastritis, and no active bleeding. He was unable to go to inpatient alcohol rehabilitation after his recent discharge on 7/19 due to being on dialysis but he has been going to an outpatient alcohol counseling program [facility] twice a week on his non-hemodialysis days- Tuesday & Thursday. He says the program has been very supportive and helpful but he has restarted drinking alcohol after stopping for about 1 week. He has been taking very little by mouth, 1 glass of water today and a small meal per day. He also reports skipping dialysis sessions for the past 2 weeks and reports tight abdominal and leg edema that makes it uncomfortable for him to walk. He denies any fever, sweats but reports a dry cough and a sore throat for the past week.   No current outpatient prescriptions on file.   Allergies: Review of patient's allergies indicates no known allergies.  Past Medical History  Diagnosis Date  . Pancytopenia      chronic  . Polysubstance abuse     chronic most notable for alcohol  .  End-stage renal disease on hemodialysis   . Malignant hypertension   . Hepatitis C   . COPD (chronic obstructive pulmonary disease)   . Chronic recurrent pancreatitis     likely secondary to alcoholism  . Smoker   . Alcohol abuse   . Respiratory failure Jan 2012    Hx of VDRF   . Alcohol withdrawal   . Pancreatitis, alcoholic    No past surgical history on file.  Family History  Problem Relation Age of Onset  . Hypertension Mother   . Alcohol abuse    . Anxiety disorder    . Hyperlipidemia    . Stroke     History   Social History  . Marital Status: Divorced    Spouse Name: N/A    Number of Children: N/A  . Years of Education: N/A   Occupational History  . Not on file.   Social History Main Topics  . Smoking status: Current Everyday Smoker -- 0.3 packs/day for 15 years    Types: Cigarettes  . Smokeless tobacco: Not on file  . Alcohol Use: 25.0 oz/week    50 drink(s) per week     02/28/11: currently not drinking, 04/04/11: drinking a fifth (25 ounces/75ccs) of vodka  3 days/week  . Drug Use: No  . Sexually Active: Not on file         Other Topics Concern  . Not on file   Social History Narrative    unemployed divorced , used to drink one bottle of whiskey for years. States he is currently drinking  1 5th of the cheapest vodka they have.As of 02/28/2011:Lives [address] House, sister-in-law's house, lives there alone   Review of Systems: As per HPI.  Physical Exam: Vitals: T: 97.7  HR: 93  BP: 128/76  RR: 22  O2 saturation: 92% on RA General: pale middle aged man resting in bed, appears somewhat listless HEENT: PERRL, EOMI, no scleral icterus Cardiac: RRR, no rubs, murmurs or gallops Pulm: junky sounding right lower lung field, expiratory wheeze heard in all other lung fields, moving normal volumes of air, no accessory muscle use Abd: soft, tender to palpation in all quadrants, distended, somewhat tight abdomen, BS present Ext: warm and well perfused, 2+  pitting pedal edema in bilateral LE, extends to anterior leg Neuro: alert and oriented X3, cranial nerves II-XII grossly intact  Lab results: Admission on 04/03/2011  Component Date Value Range Status  . Sodium (mEq/L) 04/03/2011 125* 135-145 Final  . Potassium (mEq/L) 04/03/2011 5.9* 3.5-5.1 Final  . Chloride (mEq/L) 04/03/2011 102  96-112 Final  . BUN (mg/dL) 16/05/9603 50* 5-40 Final  . Creatinine, Ser (mg/dL) 98/06/9146 8.29* 5.62-1.30 Final  . Glucose, Bld (mg/dL) 86/57/8469 62* 62-95 Final  . Calcium, Ion (mmol/L) 04/03/2011 0.96* 1.12-1.32 Final  . TCO2 (mmol/L) 04/03/2011 13  0-100 Final  . Hemoglobin (g/dL) 28/41/3244 9.9* 01.0-27.2 Final  . HCT (%) 04/03/2011 29.0* 39.0-52.0 Final  . Neutrophils Relative (%) 04/03/2011 63  43-77 Final  . Neutro Abs (K/uL) 04/03/2011 2.7  1.7-7.7 Final  . Lymphocytes Relative (%) 04/03/2011 29  12-46 Final  . Lymphs Abs (K/uL) 04/03/2011 1.2  0.7-4.0 Final  . Monocytes Relative (%) 04/03/2011 7  3-12 Final  . Monocytes Absolute (K/uL) 04/03/2011 0.3  0.1-1.0 Final  . Eosinophils Relative (%) 04/03/2011 1  0-5 Final  . Eosinophils Absolute (K/uL) 04/03/2011 0.0  0.0-0.7 Final  . Basophils Relative (%) 04/03/2011 1  0-1 Final  . Basophils Absolute (K/uL) 04/03/2011 0.0  0.0-0.1 Final  . WBC (K/uL) 04/03/2011 4.3  4.0-10.5 Final  . RBC (MIL/uL) 04/03/2011 2.94* 4.22-5.81 Final  . Hemoglobin (g/dL) 53/66/4403 9.2* 47.4-25.9 Final  . HCT (%) 04/03/2011 27.8* 39.0-52.0 Final  . MCV (fL) 04/03/2011 94.6  78.0-100.0 Final  . MCH (pg) 04/03/2011 31.3  26.0-34.0 Final  . MCHC (g/dL) 56/38/7564 33.2  95.1-88.4 Final  . RDW (%) 04/03/2011 18.5* 11.5-15.5 Final  . Platelets (K/uL) 04/03/2011 201  150-400 Final  . Sodium (mEq/L) 04/03/2011 124* 135-145 Final  . Potassium (mEq/L) 04/03/2011 5.7* 3.5-5.1 Final  . Chloride (mEq/L) 04/03/2011 92* 96-112 Final  . CO2 (mEq/L) 04/03/2011 13* 19-32 Final  . Glucose, Bld (mg/dL) 16/60/6301 61* 60-10 Final    . BUN (mg/dL) 93/23/5573 47* 2-20 Final  . Creatinine, Ser (mg/dL) 25/42/7062 3.76* 2.83-1.51 Final  . Calcium (mg/dL) 76/16/0737 7.8* 1.0-62.6 Final  . Total Protein (g/dL) 94/85/4627 7.7  0.3-5.0 Final  . Albumin (g/dL) 09/38/1829 2.7* 9.3-7.1 Final  . AST (U/L) 04/03/2011 38* 0-37 Final  . ALT (U/L) 04/03/2011 32  0-53 Final  . Alkaline Phosphatase (U/L) 04/03/2011 106  39-117 Final  . Total Bilirubin (mg/dL) 69/67/8938 0.3  1.0-1.7 Final  . GFR calc non Af Amer (mL/min) 04/03/2011 7* >60 Final  . GFR calc Af Amer (mL/min) 04/03/2011 9* >60 Final  . Lipase (U/L) 04/03/2011 47  11-59 Final  . Prothrombin Time (seconds) 04/03/2011 12.8  11.6-15.2 Final  . INR  04/03/2011 0.94  0.00-1.49 Final  . aPTT (seconds) 04/03/2011 37  24-37 Final  . Alcohol, Ethyl (B) (mg/dL)  04/03/2011 88* 0-11 Final  . Total CK (U/L) 04/03/2011 77  7-232 Final  . CK, MB (ng/mL) 04/03/2011 5.0* 0.3-4.0 Final  . Relative Index  04/03/2011 RELATIVE INDEX IS INVALID  0.0-2.5 Final  . Troponin I (ng/mL) 04/03/2011 <0.30  <0.30 Final  . Glucose-Capillary (mg/dL) 16/05/9603 44* 54-09 Final  . Comment 1  04/04/2011 Documented in Chart   Final  . Comment 2  04/04/2011 Notify RN   Final  . Color, Urine  04/03/2011 YELLOW  YELLOW Final  . Appearance  04/03/2011 CLEAR  CLEAR Final  . Specific Gravity, Urine  04/03/2011 1.010  1.005-1.030 Final  . pH  04/03/2011 6.0  5.0-8.0 Final  . Glucose, UA (mg/dL) 81/19/1478 NEGATIVE  NEGATIVE Final  . Hgb urine dipstick  04/03/2011 TRACE* NEGATIVE Final  . Bilirubin Urine  04/03/2011 NEGATIVE  NEGATIVE Final  . Ketones, ur (mg/dL) 29/56/2130 NEGATIVE  NEGATIVE Final  . Protein, ur (mg/dL) 86/57/8469 629* NEGATIVE Final  . Urobilinogen, UA (mg/dL) 52/84/1324 0.2  4.0-1.0 Final  . Nitrite  04/03/2011 NEGATIVE  NEGATIVE Final  . Leukocytes, UA  04/03/2011 NEGATIVE  NEGATIVE Final  . Squamous Epithelial / LPF  04/03/2011 RARE  RARE Final  . RBC / HPF (RBC/hpf) 04/03/2011 0-2  <3  Final   Imaging results:  Portable CXR IMPRESSION:  Interstitial Kerley B lines with trace effusions, likely indicating edema.  New hazy right lower lobe opacification could reflect asymmetric  alveolar edema, although superimposed pneumonia or other alveolar  filling process could have a similar appearance.  CT Abdomen/Pelvis IMPRESSION:  Interstitial pulmonary edema again noted with effusions and  associated probable compressive atelectasis. Superimposed pneumonia is not excluded.  Trace abdominal ascites and subcutaneous edema may suggest third spacing in the setting of volume overload. Splenorenal collaterals suggest portal hypertension or possibly  underlying liver cirrhosis.  No acute abnormality to explain the history of left-sided abdominal pain.  Other results: EKG is similar to prior tracing.  Assessment & Plan by Problem: 1. Abdominal pain is likely due to * Alcohol-induced Gastritis vs * Acute on chronic pancreatitis. However this is less likely due to CT abdomen results. *SBP -> needs to be considered given small amount of peritoneal fluid per CT and abdominal pain on the exam.  Also, CT of abdomen findings excludes the following differential Dx such as: *PUD *Cholecystitis  - NPO apart from meds - IVF, D5 1/2 NS at 125 cc/hr x 2 L - EKG in AM - Protonix IV - Morphine 2 mg IV q 2-4 hr PRN pain - FOBT to assess for an acute GI blood loss -CEx2 8 hours apart -CBC, CMP and EKG in am  2. Multiple electrolytes derangement and fluid overload secondary to ESRD and non-compliance with HD sessions. -HD per Renal service -Continue with Pericalcitriol, Nephrovite; Arnesp per Renal Service recommendation.  3. Right Lower lobe pacification and edema found on CXR and a CT are concerning for a PNA (Aspiration versus HAP->patient has a history admission within past 90 days). - Sputum culture and Grahm stain - Urine strep and legionella Ag - Vancomycin and Zosyn IV  4.  Relapsed Alcohol Abuse. Per patient's report, he completed an outpatient rehabilitation program in July. Plan:  -CIWA protocol with IV Ativan; Thiamine and Folate IV daily -SW consult to readdress inpatient alcohol rehab program that can accommodate HD sessions.  5. COPD, stable.  -Continue with Spiriva and Albuterol inhalers.  6. Hypoglycemia, likely due to poor oral intake. -CBG's q 2 hrs -D50  PRN until nutrition status improves.  7. DVT PPX: SCD's    R2/3______________________________      R1________________________________  ATTENDING: I performed and/or observed a history and physical examination of the patient.  I discussed the case with the residents as noted and reviewed the residents' notes.  I agree with the findings and plan--please refer to the attending physician note for more details.  Signature________________________________  Printed Name_____________________________

## 2011-04-04 ENCOUNTER — Encounter: Payer: Self-pay | Admitting: Ophthalmology

## 2011-04-04 ENCOUNTER — Inpatient Hospital Stay (HOSPITAL_COMMUNITY): Payer: Medicare Other

## 2011-04-04 DIAGNOSIS — R109 Unspecified abdominal pain: Secondary | ICD-10-CM

## 2011-04-04 LAB — CBC
HCT: 27.8 % — ABNORMAL LOW (ref 39.0–52.0)
Hemoglobin: 8.5 g/dL — ABNORMAL LOW (ref 13.0–17.0)
Hemoglobin: 9.2 g/dL — ABNORMAL LOW (ref 13.0–17.0)
MCH: 31.5 pg (ref 26.0–34.0)
MCHC: 33.5 g/dL (ref 30.0–36.0)
MCV: 94.1 fL (ref 78.0–100.0)
MCV: 94.2 fL (ref 78.0–100.0)
RBC: 2.7 MIL/uL — ABNORMAL LOW (ref 4.22–5.81)
RBC: 2.95 MIL/uL — ABNORMAL LOW (ref 4.22–5.81)
RDW: 18.6 % — ABNORMAL HIGH (ref 11.5–15.5)
WBC: 2.6 10*3/uL — ABNORMAL LOW (ref 4.0–10.5)

## 2011-04-04 LAB — IRON AND TIBC
Saturation Ratios: 17 % — ABNORMAL LOW (ref 20–55)
TIBC: 230 ug/dL (ref 215–435)
UIBC: 191 ug/dL (ref 125–400)

## 2011-04-04 LAB — COMPREHENSIVE METABOLIC PANEL
ALT: 25 U/L (ref 0–53)
Albumin: 2.3 g/dL — ABNORMAL LOW (ref 3.5–5.2)
Calcium: 8.5 mg/dL (ref 8.4–10.5)
GFR calc Af Amer: 20 mL/min — ABNORMAL LOW (ref >60)
Glucose, Bld: 60 mg/dL — ABNORMAL LOW (ref 70–99)
Sodium: 137 mEq/L (ref 135–145)
Total Protein: 6.9 g/dL (ref 6.0–8.3)

## 2011-04-04 LAB — BASIC METABOLIC PANEL
CO2: 15 mEq/L — ABNORMAL LOW (ref 19–32)
Chloride: 94 mEq/L — ABNORMAL LOW (ref 96–112)
Glucose, Bld: 45 mg/dL — ABNORMAL LOW (ref 70–99)
Sodium: 125 mEq/L — ABNORMAL LOW (ref 135–145)

## 2011-04-04 LAB — PHOSPHORUS: Phosphorus: 4.4 mg/dL (ref 2.3–4.6)

## 2011-04-04 LAB — URINE MICROSCOPIC-ADD ON

## 2011-04-04 LAB — MRSA PCR SCREENING: MRSA by PCR: NEGATIVE

## 2011-04-04 LAB — GLUCOSE, CAPILLARY
Glucose-Capillary: 44 mg/dL — CL (ref 70–99)
Glucose-Capillary: 66 mg/dL — ABNORMAL LOW (ref 70–99)
Glucose-Capillary: 83 mg/dL (ref 70–99)
Glucose-Capillary: 95 mg/dL (ref 70–99)

## 2011-04-04 LAB — URINALYSIS, ROUTINE W REFLEX MICROSCOPIC
Glucose, UA: NEGATIVE mg/dL
Ketones, ur: NEGATIVE mg/dL
Leukocytes, UA: NEGATIVE
Protein, ur: 100 mg/dL — AB
Urobilinogen, UA: 0.2 mg/dL (ref 0.0–1.0)

## 2011-04-04 LAB — PROTIME-INR: Prothrombin Time: 13.5 seconds (ref 11.6–15.2)

## 2011-04-04 LAB — CARDIAC PANEL(CRET KIN+CKTOT+MB+TROPI)
Relative Index: INVALID (ref 0.0–2.5)
Total CK: 64 U/L (ref 7–232)

## 2011-04-04 LAB — TROPONIN I: Troponin I: <0.3 ng/mL (ref <0.30)

## 2011-04-04 IMAGING — CR DG ABDOMEN ACUTE W/ 1V CHEST
3 series · 3 of 3 positions shown · non-contrast
Comparison: Previous day's exam and earlier studies

CLINICAL DATA: Abdominal pain

ACUTE ABDOMEN SERIES (ABDOMEN 2 VIEW & CHEST 1 VIEW)

[w chest pa]
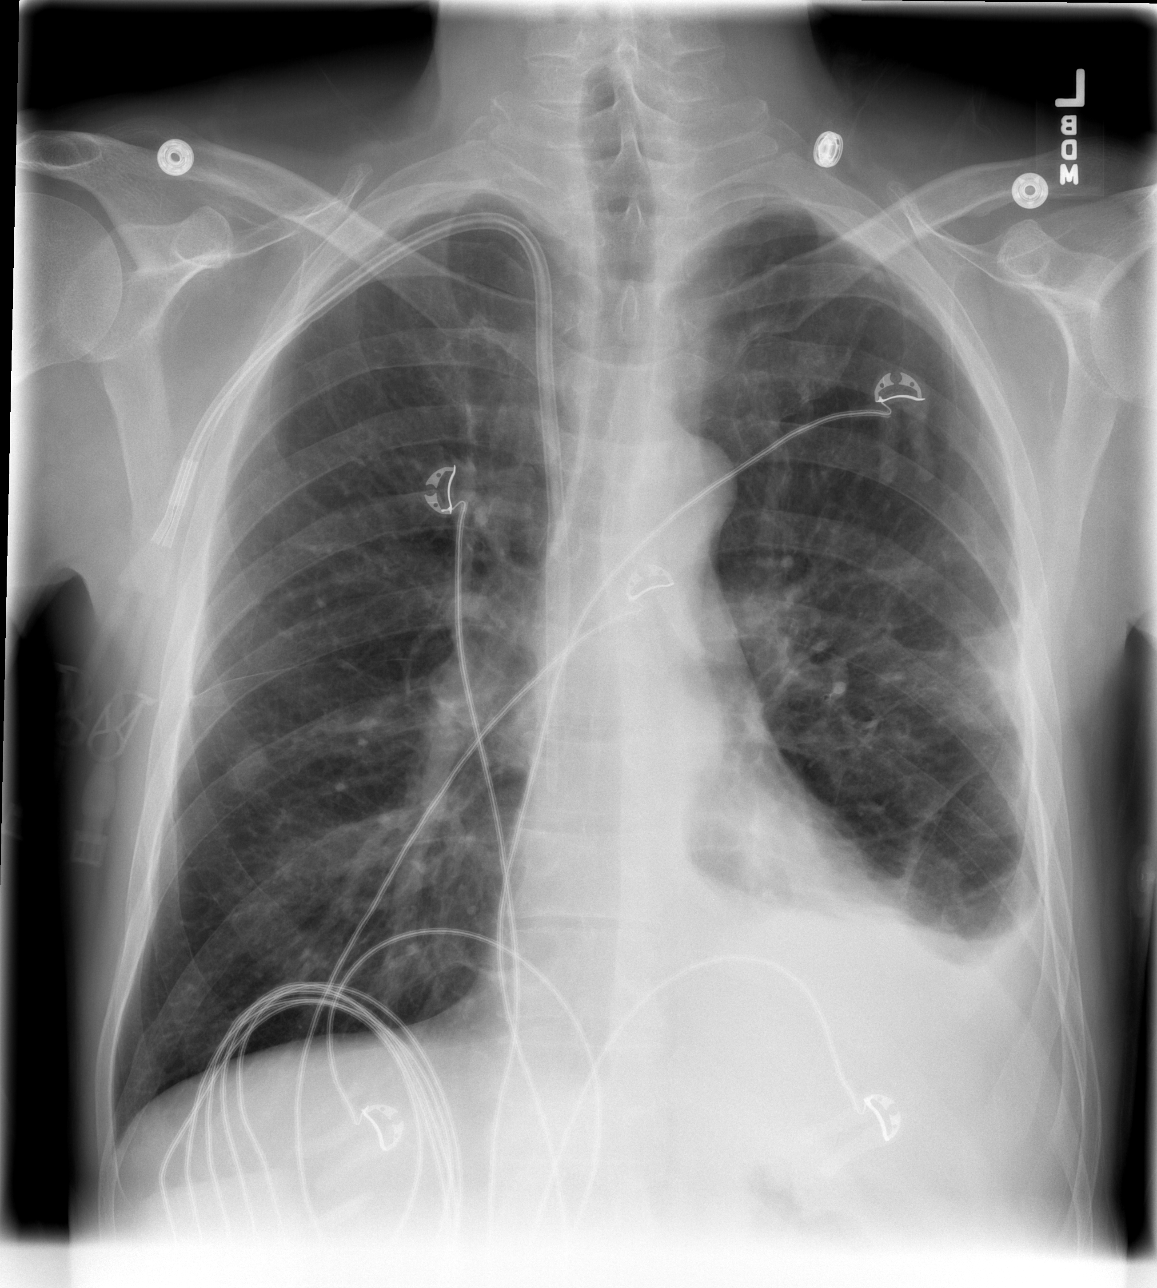

[w abdomen upright]
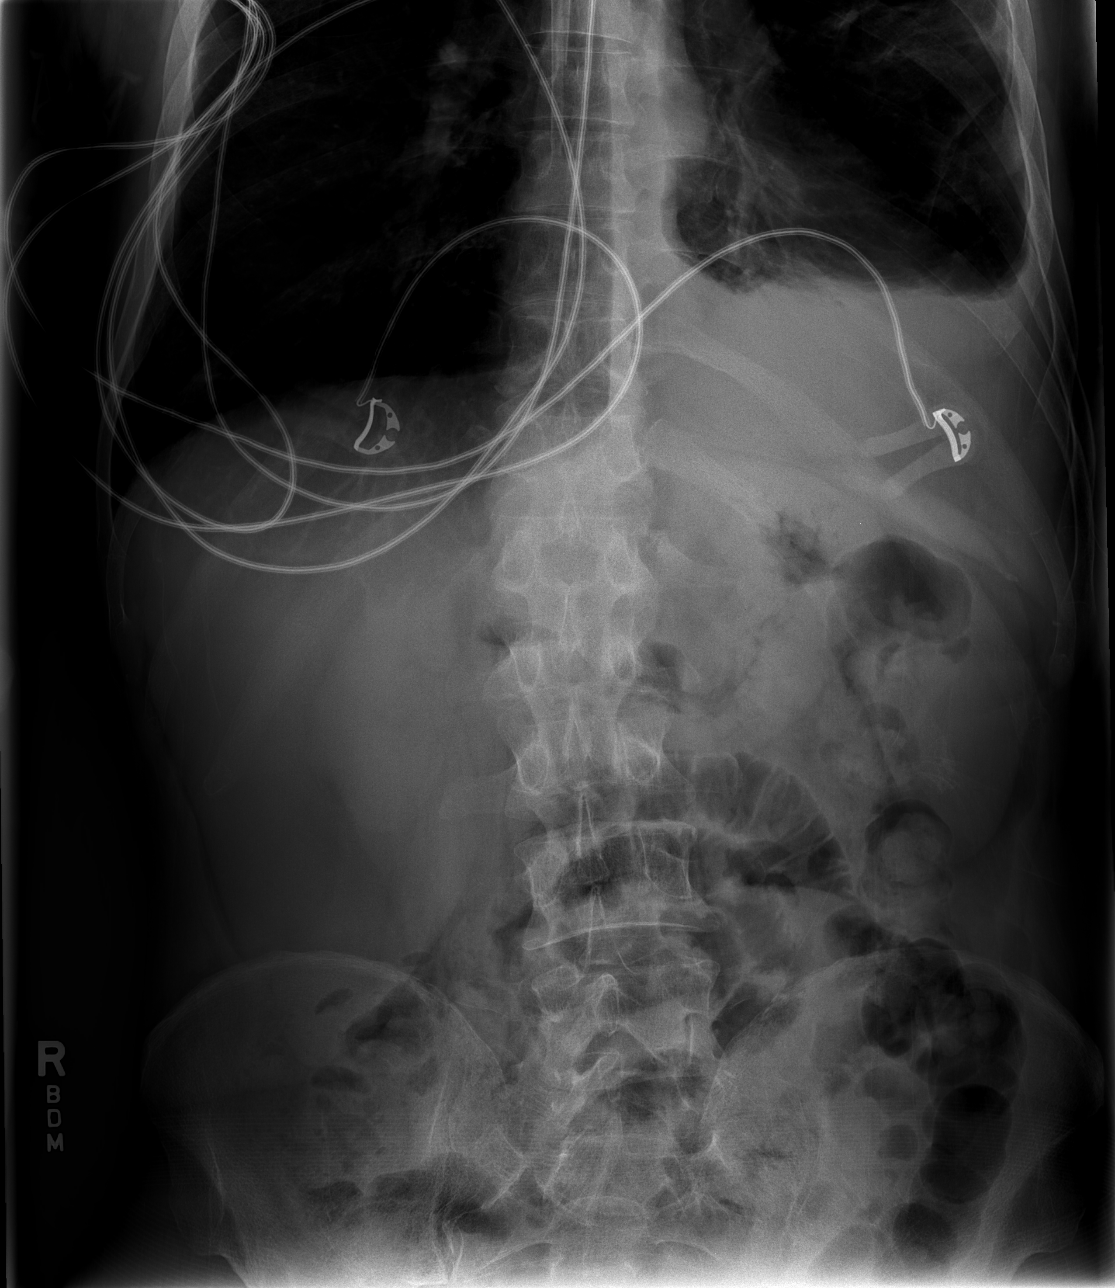

[t abdomen supine]
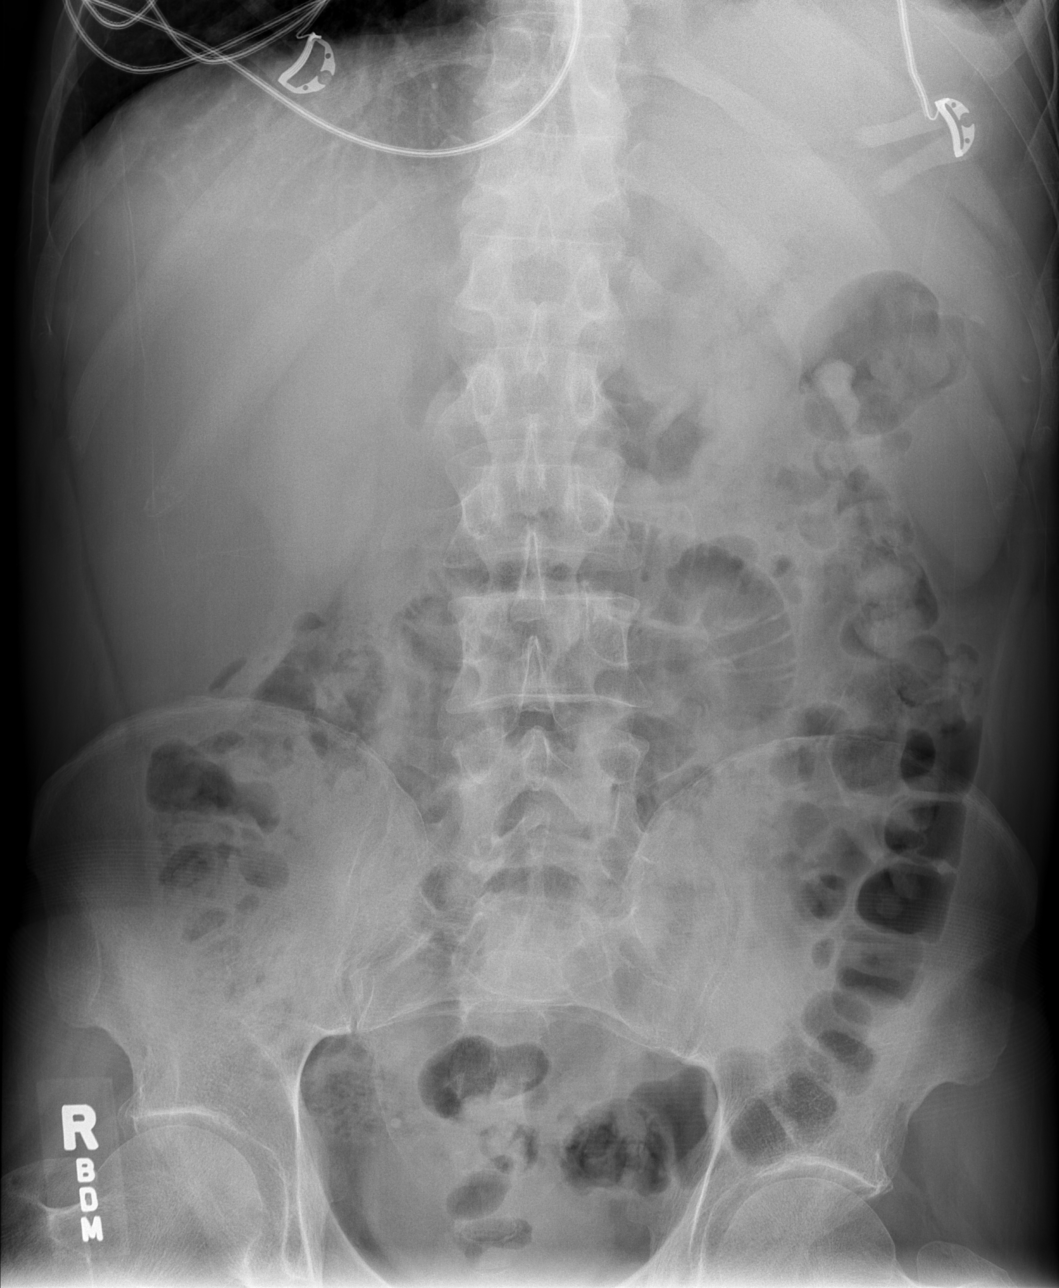

[3 of 3 positions shown; findings below may reference images not displayed]

FINDINGS: Stable right IJ hemodialysis catheter.  Improved aeration
in the left lower lobe with residual focal airspace opacities   in
the lateral midlung and infrahilar region.  Right lung remains
clear.  A moderate left pleural effusion is noted.
No free air on the erect film.

There are several distended mid abdominal small bowel loops.
Normal distribution of gas and stool throughout the colon.

Stable right pelvic phlebolith.
IMPRESSION: 1.  Nonspecific mid abdominal small bowel distention, possibly
early ileus or obstruction.  Consider follow-up films or CT if
symptoms persist.
2.  No free  air.
3.  Moderate left pleural effusion.

## 2011-04-05 ENCOUNTER — Inpatient Hospital Stay (HOSPITAL_COMMUNITY): Payer: Medicare Other

## 2011-04-05 DIAGNOSIS — F1020 Alcohol dependence, uncomplicated: Secondary | ICD-10-CM

## 2011-04-05 LAB — BASIC METABOLIC PANEL
BUN: 20 mg/dL (ref 6–23)
Chloride: 100 mEq/L (ref 96–112)
Glucose, Bld: 91 mg/dL (ref 70–99)
Potassium: 4.4 mEq/L (ref 3.5–5.1)

## 2011-04-05 LAB — CBC
HCT: 27.7 % — ABNORMAL LOW (ref 39.0–52.0)
Hemoglobin: 8.8 g/dL — ABNORMAL LOW (ref 13.0–17.0)
MCV: 96.9 fL (ref 78.0–100.0)
WBC: 2.3 10*3/uL — ABNORMAL LOW (ref 4.0–10.5)

## 2011-04-05 LAB — HEPATIC FUNCTION PANEL
ALT: 20 U/L (ref 0–53)
AST: 21 U/L (ref 0–37)
Alkaline Phosphatase: 86 U/L (ref 39–117)
Bilirubin, Direct: 0.2 mg/dL (ref 0.0–0.3)
Total Bilirubin: 0.4 mg/dL (ref 0.3–1.2)

## 2011-04-05 LAB — GLUCOSE, CAPILLARY
Glucose-Capillary: 88 mg/dL (ref 70–99)
Glucose-Capillary: 87 mg/dL (ref 70–99)
Glucose-Capillary: 131 mg/dL — ABNORMAL HIGH (ref 70–99)

## 2011-04-05 IMAGING — US US THORACENTESIS
1 series · 5 of 5 positions shown · non-contrast
Comparison: none

CLINICAL DATA: COPD, hepatitis C, end-stage renal disease,
pancreatitis, left pleural effusion; request is made for diagnostic
and therapeutic left thoracentesis.

[Series 1: us thoracentesis · 0.31mm/px · 5 of 5 slices shown]
[im 1/5]
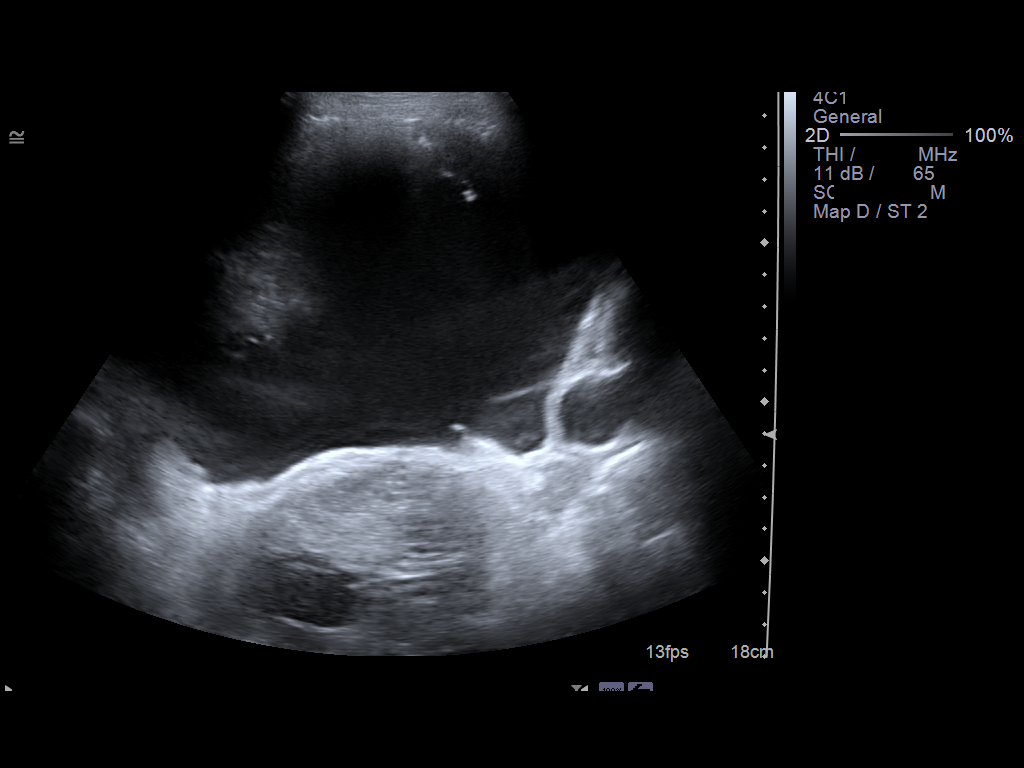
[im 2/5]
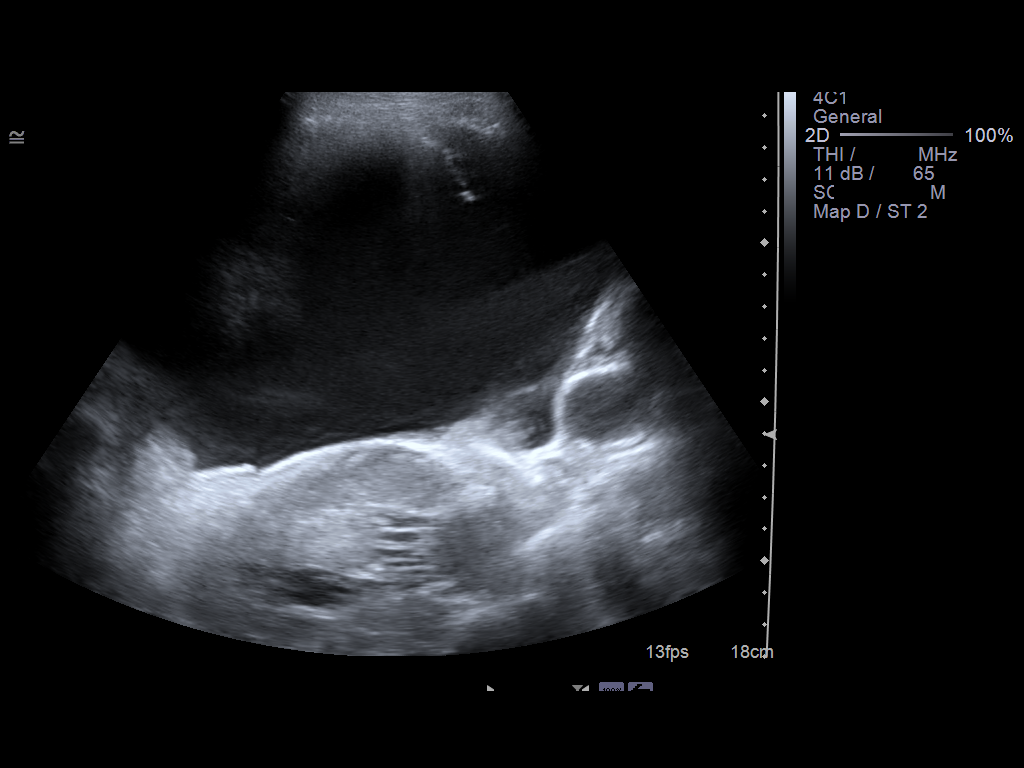
[im 3/5]
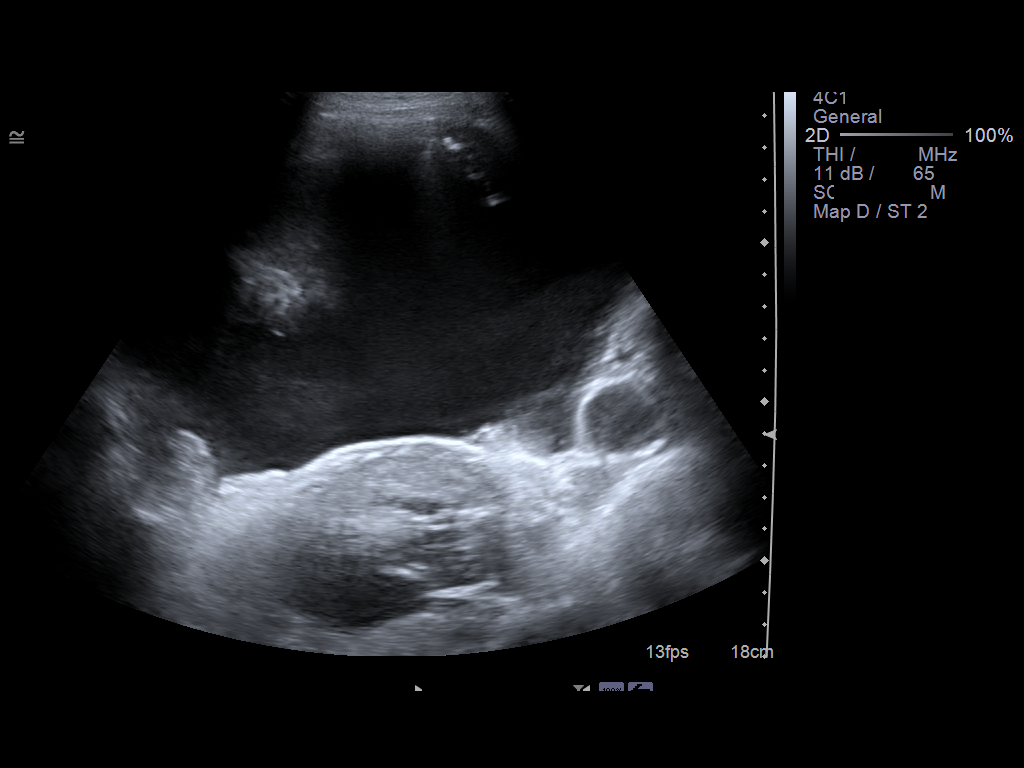
[im 4/5]
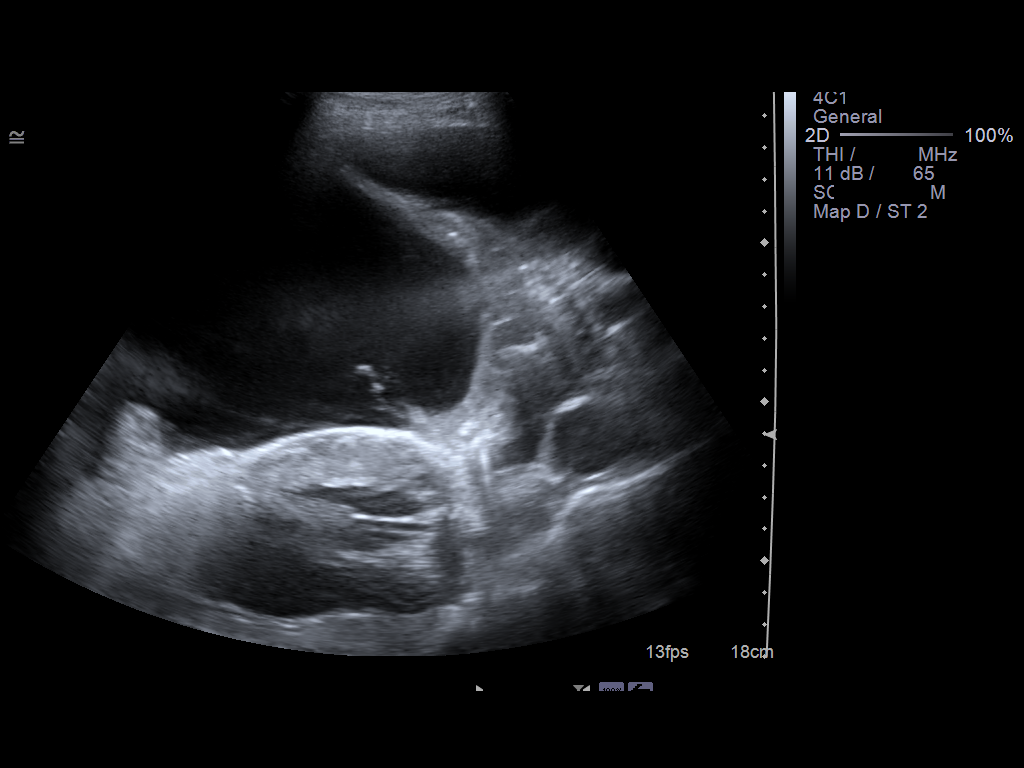
[im 5/5]
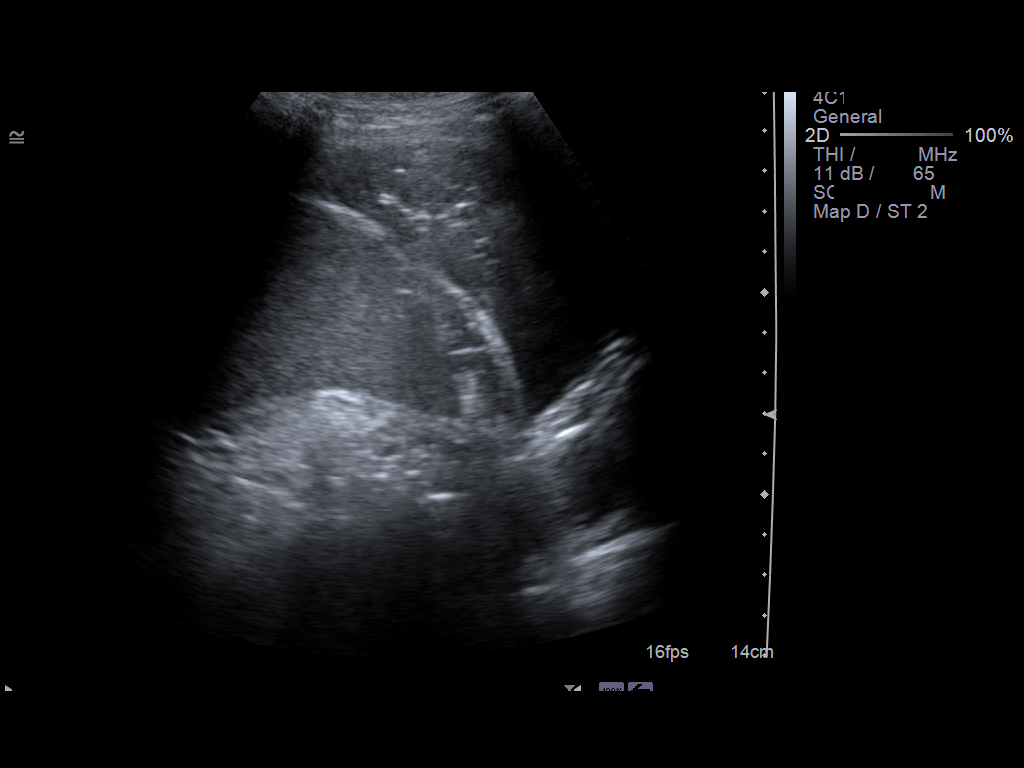

[5 of 5 positions shown; findings below may reference images not displayed]

ULTRASOUND GUIDED DIAGNOSTIC AND THERAPEUTIC LEFT  THORACENTESIS

An ultrasound guided thoracentesis was thoroughly discussed with
the patient and questions answered.  The benefits, risks,
alternatives and complications were also discussed.  The patient
understands and wishes to proceed with the procedure.  Written
consent was obtained.

Ultrasound was performed to localize and mark an adequate pocket of
fluid in the left  chest.  The area was then prepped and draped in
the normal sterile fashion.  1% Lidocaine was used for local
anesthesia.  Under ultrasound guidance a 19 gauge Yueh catheter was
introduced.  Thoracentesis was performed.  The catheter was removed
and a dressing applied.

Complications:  none
FINDINGS: A total of approximately 880 cc's of slightly turbid,
amber fluid was removed.  A fluid sample was  sent for laboratory
analysis.
IMPRESSION: Successful ultrasound guided diagnostic and therapeutic left
thoracentesis yielding 880 cc's of pleural fluid.

Read by: Madarame, Asano.-BOT

## 2011-04-05 IMAGING — CR DG CHEST 2V
2 series · 2 of 2 positions shown · non-contrast
Comparison: 09/26/2010

CLINICAL DATA: Dehydration

CHEST - 2 VIEW

[w chest pa]
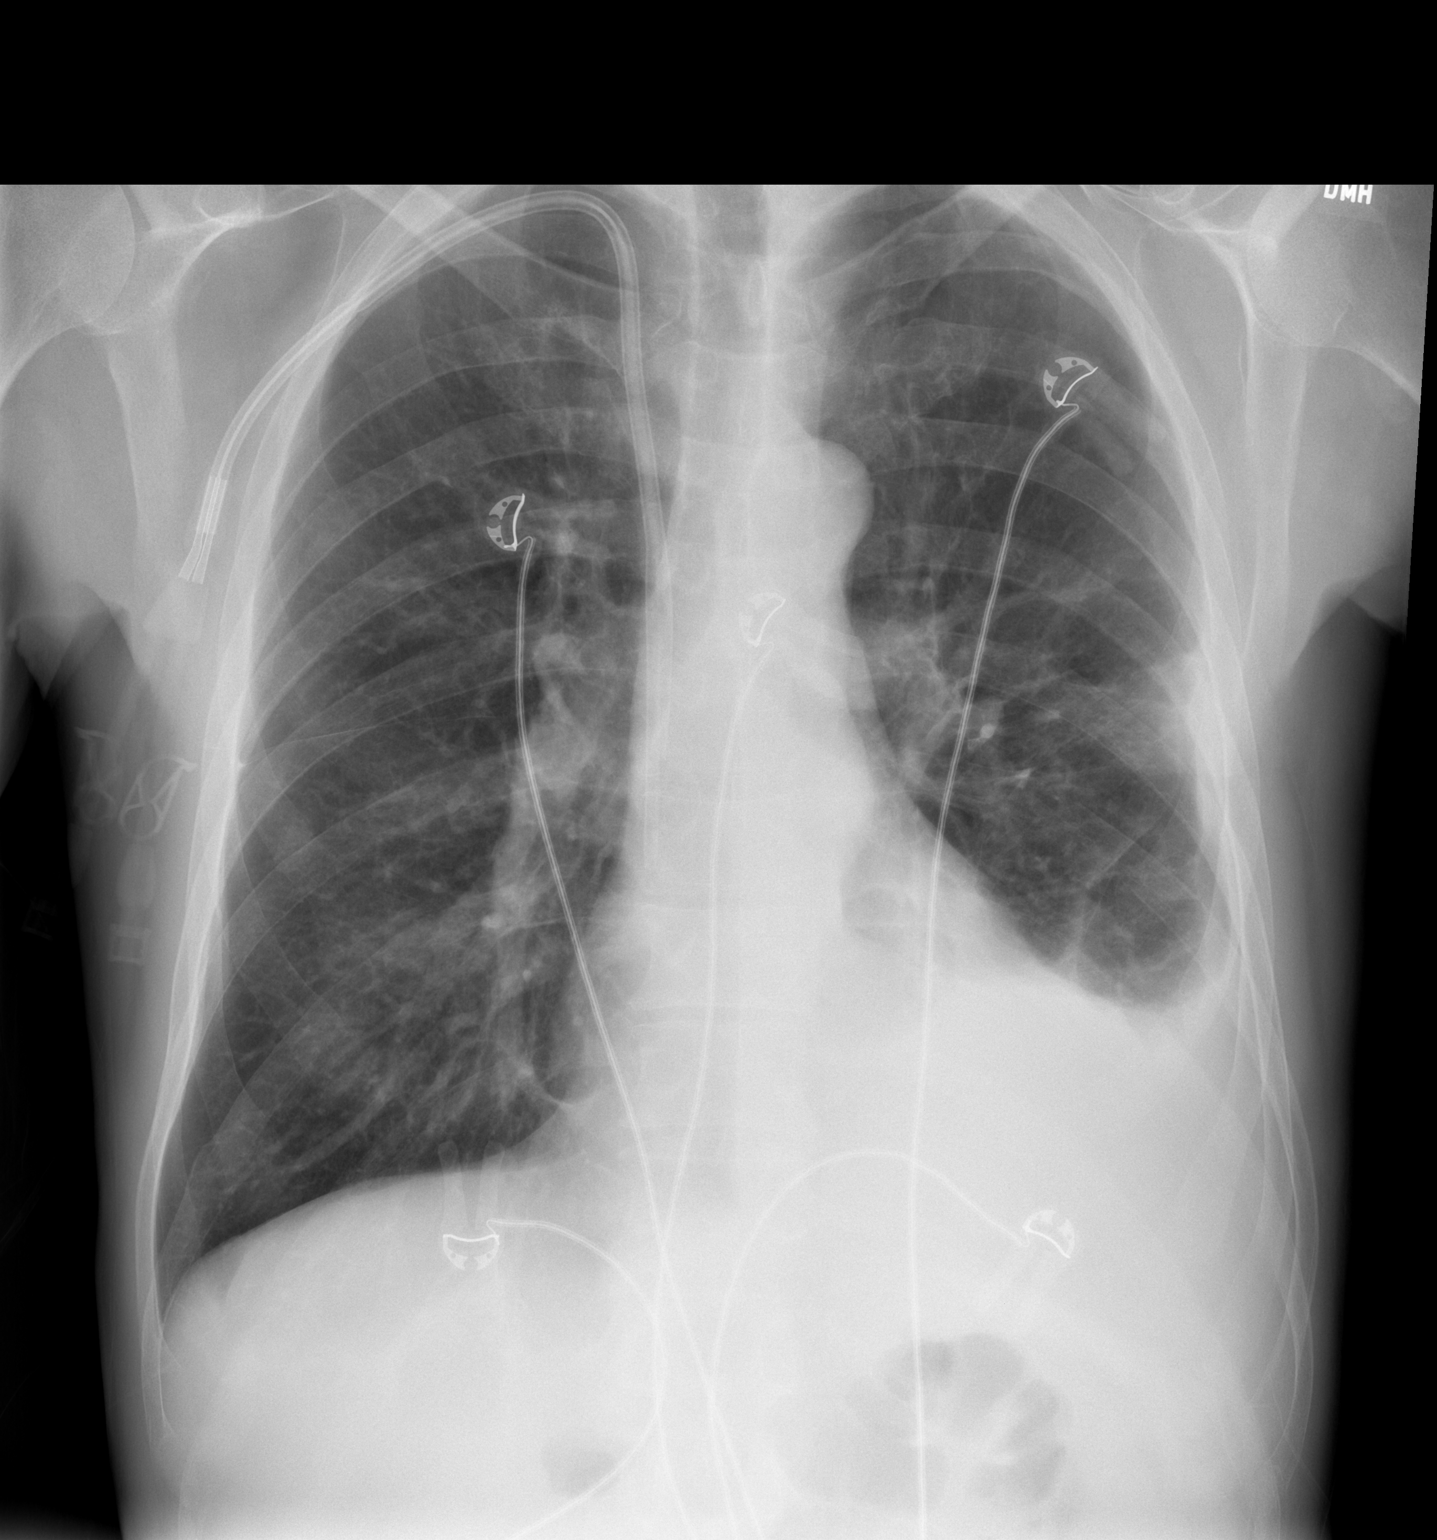

[w chest lat]
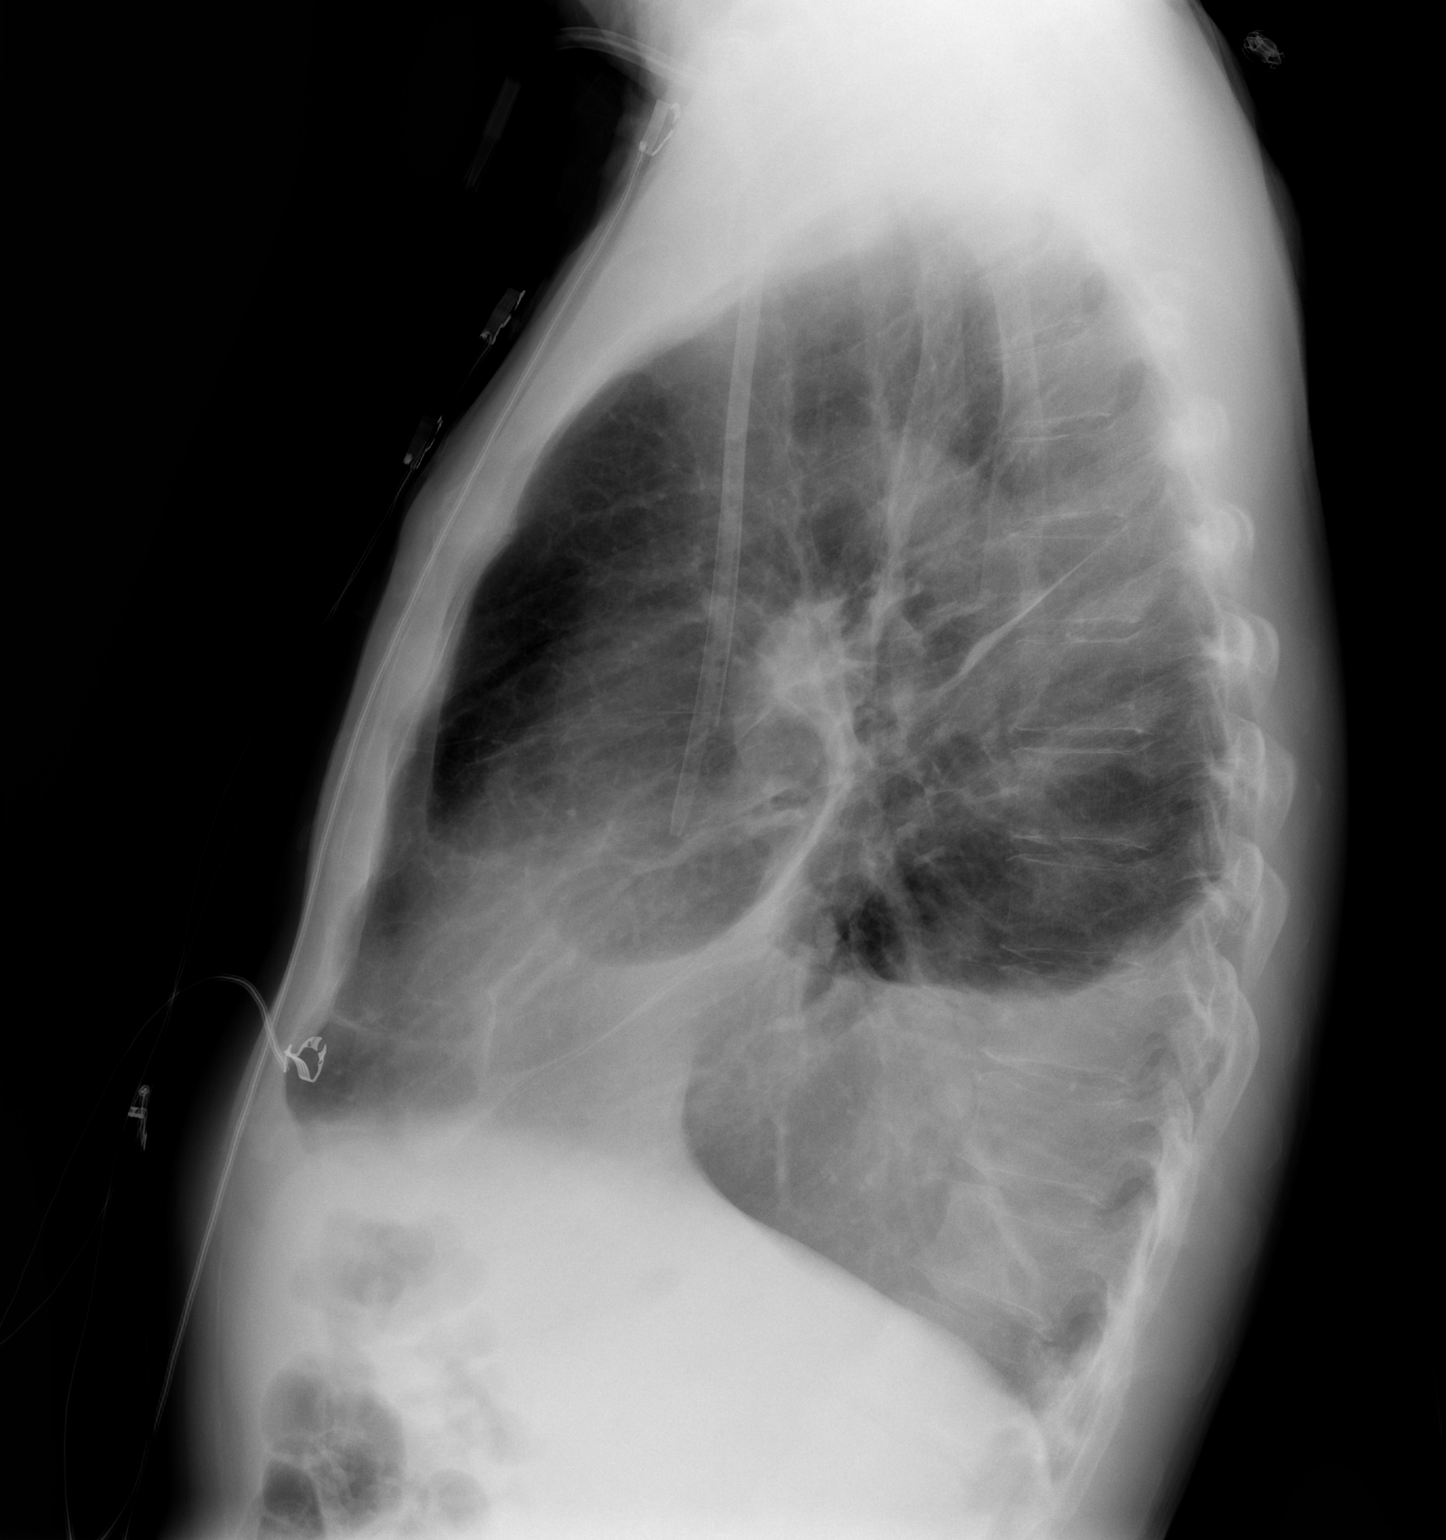

[2 of 2 positions shown; findings below may reference images not displayed]

FINDINGS: Right central venous catheter unchanged in position.
Normal heart size and pulmonary vascularity.  Left pleural effusion
with basilar atelectasis or consolidation unchanged.
IMPRESSION: Left pleural effusion and basilar atelectasis or consolidation
without change.

## 2011-04-06 ENCOUNTER — Inpatient Hospital Stay (HOSPITAL_COMMUNITY): Payer: Medicare Other

## 2011-04-06 LAB — IRON AND TIBC
Saturation Ratios: 16 % — ABNORMAL LOW (ref 20–55)
TIBC: 219 ug/dL (ref 215–435)
UIBC: 183 ug/dL (ref 125–400)

## 2011-04-07 LAB — GLUCOSE, CAPILLARY: Glucose-Capillary: 104 mg/dL — ABNORMAL HIGH (ref 70–99)

## 2011-04-08 LAB — GLUCOSE, CAPILLARY
Glucose-Capillary: 119 mg/dL — ABNORMAL HIGH (ref 70–99)
Glucose-Capillary: 98 mg/dL (ref 70–99)

## 2011-04-09 ENCOUNTER — Inpatient Hospital Stay (HOSPITAL_COMMUNITY): Payer: Medicare Other

## 2011-04-09 DIAGNOSIS — F1010 Alcohol abuse, uncomplicated: Secondary | ICD-10-CM

## 2011-04-09 DIAGNOSIS — Z992 Dependence on renal dialysis: Secondary | ICD-10-CM

## 2011-04-09 DIAGNOSIS — R109 Unspecified abdominal pain: Secondary | ICD-10-CM

## 2011-04-09 LAB — COMPREHENSIVE METABOLIC PANEL
ALT: 18 U/L (ref 0–53)
Albumin: 2.5 g/dL — ABNORMAL LOW (ref 3.5–5.2)
Alkaline Phosphatase: 69 U/L (ref 39–117)
Chloride: 103 mEq/L (ref 96–112)
Potassium: 3.7 mEq/L (ref 3.5–5.1)
Sodium: 139 mEq/L (ref 135–145)
Total Bilirubin: 0.2 mg/dL — ABNORMAL LOW (ref 0.3–1.2)
Total Protein: 6.7 g/dL (ref 6.0–8.3)

## 2011-04-09 LAB — CBC
HCT: 28.3 % — ABNORMAL LOW (ref 39.0–52.0)
Hemoglobin: 9 g/dL — ABNORMAL LOW (ref 13.0–17.0)
MCHC: 31.8 g/dL (ref 30.0–36.0)
RDW: 18 % — ABNORMAL HIGH (ref 11.5–15.5)
WBC: 3.2 10*3/uL — ABNORMAL LOW (ref 4.0–10.5)

## 2011-04-09 LAB — GLUCOSE, CAPILLARY
Glucose-Capillary: 84 mg/dL (ref 70–99)
Glucose-Capillary: 106 mg/dL — ABNORMAL HIGH (ref 70–99)

## 2011-04-10 DIAGNOSIS — R109 Unspecified abdominal pain: Secondary | ICD-10-CM

## 2011-04-10 DIAGNOSIS — Z992 Dependence on renal dialysis: Secondary | ICD-10-CM

## 2011-04-10 DIAGNOSIS — F1010 Alcohol abuse, uncomplicated: Secondary | ICD-10-CM

## 2011-04-10 LAB — GLUCOSE, CAPILLARY
Glucose-Capillary: 105 mg/dL — ABNORMAL HIGH (ref 70–99)
Glucose-Capillary: 112 mg/dL — ABNORMAL HIGH (ref 70–99)

## 2011-04-10 NOTE — Consult Note (Signed)
  NAMEJACIEL, [name]               ACCOUNT NO.:  [id number]  MEDICAL RECORD NO.:  [id number]  LOCATION:  3108                         FACILITY:  [facility]  PHYSICIAN:  Eulogio Ditch, MD DATE OF BIRTH:  08-09-1955  DATE OF CONSULTATION:  04/05/2011 DATE OF DISCHARGE:                                CONSULTATION   REASON FOR CONSULTATION:  Inpatient rehab for alcohol abuse.  HISTORY OF PRESENT ILLNESS:  A 55 year old Caucasian male who has a long history of chronic alcohol dependence admitted on the medical floor because of the abdominal pain, diarrhea, and vomiting.  The patient is known to me by being to [facility] a number of times.  The patient is unemployed, divorced, lives alone.  On discussing about his alcohol abuse, the patient reported that he is not drinking now and he is getting a good benefit by going [facility] a [facility].  He also stated that he educate people about not drinking especially the young people.  Last time when he was with [place], we started him on Haldol to control his agitation and anxiety, but the patient stopped taking that because of the side effects from the Haldol.  The patient stated that his main problem is his anxiety and that is why sometime he drinks to cope with that.  The patient agreed to take Celexa 20 mg p.o. daily and Klonopin 1 mg at bedtime to control his anxiety symptoms.  Side effects and risks of dependence from Klonopin discussed with the patient, but I think in this patient Klonopin will be a good choice as it can control his anxiety and then he can stay away from the alcohol.  The patient is logical and goal directed during the interview, not hallucinating or delusional.  He was pleasant on approach.  Cognition alert and oriented x3.  Memory immediate, recent, remote fair. Attention and concentration fair.  Abstraction ability fair.  Insight and judgment fair.  Not suicidal or homicidal.  DIAGNOSES:   Axis I:  Chronic alcohol dependence, anxiety disorder not otherwise specified. Axis II:  Deferred. Axis III:  See medical notes. Axis IV:  Chronic alcohol abuse. Axis V:  50.  RECOMMENDATIONS: 1. The patient can be started on Celexa 20 mg p.o. daily along with     Klonopin 1 mg at bedtime to control his anxiety symptoms. 2. Psychoeducation given to the patient regarding the alcohol abuse. 3. The patient at this time does not want to be admitted to the     inpatient rehab and I discussed the case with the resident about     the patient.  Thanks for involving me in taking care of this patient.     Eulogio Ditch, MD     SA/MEDQ  D:  04/05/2011  T:  04/05/2011  Job:  [id number]  Electronically Signed by Eulogio Ditch  on 04/10/2011 03:02:03 PM

## 2011-04-11 ENCOUNTER — Inpatient Hospital Stay (HOSPITAL_COMMUNITY): Payer: Medicare Other

## 2011-04-11 ENCOUNTER — Encounter: Payer: Medicare Other | Admitting: Internal Medicine

## 2011-04-11 LAB — RENAL FUNCTION PANEL
BUN: 32 mg/dL — ABNORMAL HIGH (ref 6–23)
CO2: 26 mEq/L (ref 19–32)
Calcium: 9.4 mg/dL (ref 8.4–10.5)
GFR calc Af Amer: 9 mL/min — ABNORMAL LOW (ref >60)
Glucose, Bld: 118 mg/dL — ABNORMAL HIGH (ref 70–99)
Sodium: 138 mEq/L (ref 135–145)

## 2011-04-11 LAB — CBC
HCT: 30.6 % — ABNORMAL LOW (ref 39.0–52.0)
Hemoglobin: 9.6 g/dL — ABNORMAL LOW (ref 13.0–17.0)
MCH: 31 pg (ref 26.0–34.0)
MCHC: 31.4 g/dL (ref 30.0–36.0)
MCV: 98.7 fL (ref 78.0–100.0)
RBC: 3.1 MIL/uL — ABNORMAL LOW (ref 4.22–5.81)

## 2011-04-11 MED ORDER — IOHEXOL 300 MG/ML  SOLN
100.0000 mL | Freq: Once | INTRAMUSCULAR | Status: AC | PRN
  Administered 2011-04-11: 50 mL via INTRAVENOUS

## 2011-04-12 LAB — GLUCOSE, CAPILLARY
Glucose-Capillary: 118 mg/dL — ABNORMAL HIGH (ref 70–99)
Glucose-Capillary: 90 mg/dL (ref 70–99)
Glucose-Capillary: 121 mg/dL — ABNORMAL HIGH (ref 70–99)

## 2011-04-24 ENCOUNTER — Encounter: Payer: Medicare Other | Admitting: Internal Medicine

## 2011-04-27 NOTE — Discharge Summary (Signed)
[name], [name] NO.:  [id number]  MEDICAL RECORD NO.:  [id number]  LOCATION:  5015                         FACILITY:  [facility]  PHYSICIAN:  Doneen Poisson, MD     DATE OF BIRTH:  [birth year]  DATE OF ADMISSION:  04/03/2011 DATE OF DISCHARGE:  04/12/2011                              DISCHARGE SUMMARY   DISCHARGE DIAGNOSES: 1. Acute on chronic pancreatitis secondary to alcoholism. 2. Aspiration pneumonia. 3. End stage renal disease on hemodialysis, Tuesday, Thursday,     Saturday. 4. Peptic ulcer disease, alcohol induced gastritis. 5. Hypertension. 6. Hepatitis B.  Viral load on February 2012, 57,800 genotype 1A. 7. Tobacco abuse. 8. Alcohol abuse. 9. Anemia of chronic disease.  DISCHARGE MEDICATIONS: 1. Acetaminophen 325 mg tab 2 tabs every 6 hours p.r.n. as needed. 2. Calcium carbonate suspension 500 mg p.o. q.6 h. as needed. 3. Nepro with Carb Vanilla 237 mL p.o. three times a day as needed. 4. Percocet 5/325, 1-2 tabs q.6 h. as needed. 5. Protonix 80 mg p.o. b.i.d. 6. Promethazine 12.5 mg p.o. q.6 h. as needed. 7. Calcium acetate 667 mg 3 capsules by mouth t.i.d. 8. Creon 24,000 units 1 capsule by mouth t.i.d. 9. Folic acid 1 mg p.o. daily. 10.Renal vitamins 1 tablet p.o. daily. 11.Spiriva 18 mcg 1 capsule inhaled daily. 12.Thiamine 100 mg 1 tablet p.o. daily. 13.Albuterol inhaler 2 puffs inhaled every 4 hours as needed for     shortness of breath.  DISPOSITION:  [name] was discharged in stable and improved condition with follow-up appointments with Dr. Melida Quitter in the [facility] in 2-3 weeks.  Social Worker Bonnye Fava will work with [name] as an  outpatient to assist with securing residence in an assisted living facility.  PROCEDURES PERFORMED: Chest x-ray, showed Kerley B lines with trace effusion, likely indicating  edema.  He had a new hazy right lower lobe opacification,  which could  reflect asymmetric alveolar edema, although  superimposed pneumonia or  other alveolar filling process could have the similar appearance.  Abdominal and pelvis CT demonstrated interstitial pulmonary edema, with effusions and associated probable compressive atelectasis. Superimposed pneumonia not excluded.  Trace abdominal ascites and subcutaneous edema may suggest third spacing in the setting of volume overload.  Splenorenal collaterals suggest portal hypertension, or possibly underlying liver cirrhosis.  No acute abnormality to explain the history of left-sided abdominal pain.  EKG was similar to prior tracings.    Hemodialysis.  CONSULTATIONS:  Renal Service   Behavioral Health  BRIEF ADMITTING HISTORY AND PHYSICAL:  Mr.  [name] is a 55 year old man with a history of multiple admissions for alcoholic pancreatitis and alcoholic gastritis, as well as end-stage renal disease.  He presented with 1 day of abdominal pain, diarrhea, and vomiting.  He reported that there was bright red blood present in the diarrhea, though he denies hematemesis.  He reports taking his PPI daily.  He denies any aspirin/NSAID use.  EGD done prior to admission revealed duodenitis, esophagitis, mild gastritis, and no active bleeding.  He was unable to go to inpatient alcohol rehabilitation after his recent discharge on September 19 due to his being on dialysis, but he has been  going to an outpatient alcohol counseling program, [facility], (alcohol drug services) twice a week on his non-hemodialysis days.  He states the program has been very supportive and helpful, but he has restarted drinking alcohol after stopping for about 1 week.  He had been taking very little by mouth, one glass of water today and a small meal per day.  He also reports skipping dialysis sessions for the past 2 weeks and reports tight abdominal and leg edema that may get uncomfortable for him to walk.  He denied any fevers, sweats, but reported a dry cough and sore throat for the prior  week.  ADMITTING LABORATORY DATA:  Sodium 125, potassium 5.9, chloride 102, BUN 58, creatinine 9, glucose 62, calcium 4.96, total CO2 13, hemoglobin 9.9%, neutrophils 63%, white blood cell count 4.3, hemoglobin 9.2, hematocrit 27.8, platelets 201, MCV 94.6, total protein 7.7, calcium 7.8, albumin 2.7, AST 38, ALT 32.  Alkaline phosphatase 106, total bilirubin 0.3, eGFR 7, lipase 47.  Prothrombin time 12.8, PTT 37.  Alcohol level 88.  Total CK  77, CK-MB 5.0.  Troponin < 0.30.  Urine showed trace hemoglobin, 100  protein, 0.2 urobilinogen. 0 to 2 rbc's and rare squamous epithelial cells.  HOSPITAL COURSE BY PROBLEM LIST: 1. Abdominal pain, likely due to alcohol induced gastritis versus     acute on chronic pancreatitis. These symptoms were managed with     p.r.n. analgesics and high dose PPI with advancement of his diet as     tolerated.  By discharge he was tolerating a full diet.  2. Aspiration pneumonia.  [name] completed a 5-day course of     clindamycin.  He will need to have a follow-up chest x-ray to     assess the resolution of his infiltrate in 2-4 weeks.  3. End-stage renal disease.  Renal was consulted and scheduled his     hemodialysis during admission.  They insisted that if he were to      continue at [facility] as his hemodialysis center      then inpatient rehabilitation for alcohol abuse would have to be      completed.  This option was explored in depth with the assistance      of case managers and social workers.  He received consultation by      [facility] which determined that he was not eligible for     inpatient admission to [facility] because he was not     suicidal or homicidal.  Options were explored for other inpatient     rehabilitation throughout the [place], which were declined due to     his end-stage renal disease, and need for scheduled hemodialysis.       In addition, he is dependent on Medicaid transportation to his       hemodialysis sessions.  Medicaid transportation will not take him      outside of [facility].  Thus, the option of finding a      different Hemodialysis Center was limited.  We spoke with Dr.      Abel Presto about the inability to secure inpatient rehab and went      as far as to look for "halfway houses".  By the time of discharge,      the best alternative was that [name] would continue outpatient      rehab and move to a assisted living facility.  I spoke with Bard Herbert, the  physician assistant for [facility] to      inform her of his pending discharge and confirmed that he will      continue hemodialysis on Tuesday, Thursday, and Saturday.  He was      discharged on recommended renal medications in addition to 30      tablets of Percocet with no refills for his chronic pancreatitis pain.  4. His chronic obstructive pulmonary disease remained stable and we     continued him on Spiriva and albuterol inhaler.  5. His initial hypoglycemia was likely due to poor oral intake and     this improved once his nutrition status improved.  6. He was initially hypertensive which was likely due to increased     volume status and improved with dialysis.  7. His anemia of chronic disease was managed on EPO.  He was      discharged with a prescription for EPO (Aranesp).  DISCHARGE LABS AND VITAL SIGNS:  Temperature 98.5, blood pressure 155/86, heart rate 90, respirations 20.  O2 saturation 97% on room air.   Hemoglobin 9.6, hematocrit 30.6, white blood cell count 3.9, platelets  240, MCV 98.7, Sodium 130, potassium 4.0, chloride 99, bicarbonate 26,  BUN 28, creatinine 7.47, glucose 118, calcium 9.4, phosphorus 5.6,  iron 36, TIBC 219, percent Fe saturation 16, UIBC 183.  Parathyroid  hormone 159   ______________________________ Kristie Cowman, MD   ______________________________ Doneen Poisson, MD   KS/MEDQ  D:  04/12/2011  T:  04/13/2011  Job:   [id number]  cc:   Melida Quitter, MD  Electronically Signed by Kristie Cowman MD on 04/22/2011 01:06:00 PM Electronically Signed by Doneen Poisson  on 04/27/2011 11:54:08 AM

## 2011-04-30 ENCOUNTER — Encounter: Payer: Medicare Other | Admitting: Internal Medicine

## 2011-05-06 LAB — CBC
HCT: 25.8 — ABNORMAL LOW
HCT: 25.8 — ABNORMAL LOW
HCT: 26.1 — ABNORMAL LOW
HCT: 26.1 — ABNORMAL LOW
HCT: 29 — ABNORMAL LOW
HCT: 34.7 — ABNORMAL LOW
Hemoglobin: 8.4 — ABNORMAL LOW
Hemoglobin: 8.7 — ABNORMAL LOW
Hemoglobin: 9 — ABNORMAL LOW
Hemoglobin: 9.3 — ABNORMAL LOW
Hemoglobin: 9.6 — ABNORMAL LOW
MCHC: 33.4
MCHC: 33.9
MCHC: 34.1
MCHC: 34.2
MCHC: 34.3
MCHC: 34.3
MCHC: 34.5
MCHC: 34.6
MCHC: 34.7
MCHC: 34.4
MCV: 90.4
MCV: 90.6
MCV: 90.8
MCV: 91.1
MCV: 90.1
MCV: 90.9
MCV: 92.2
Platelets: 150
Platelets: 155
Platelets: 163
Platelets: 190
Platelets: 222
Platelets: 99 — ABNORMAL LOW
RBC: 2.72 — ABNORMAL LOW
RBC: 2.85 — ABNORMAL LOW
RBC: 2.9 — ABNORMAL LOW
RBC: 3 — ABNORMAL LOW
RBC: 3.01 — ABNORMAL LOW
RBC: 3.06 — ABNORMAL LOW
RBC: 3.18 — ABNORMAL LOW
RBC: 3.47 — ABNORMAL LOW
RBC: 3.82 — ABNORMAL LOW
RDW: 13.8
RDW: 14.6
RDW: 14.6
RDW: 15
RDW: 13.2
RDW: 14.8
RDW: 15
RDW: 15.1
WBC: 3.7 — ABNORMAL LOW
WBC: 3.9 — ABNORMAL LOW
WBC: 4
WBC: 4.5
WBC: 4.6
WBC: 3.3 — ABNORMAL LOW
WBC: 5.4

## 2011-05-06 LAB — BASIC METABOLIC PANEL
BUN: 21
BUN: 21
BUN: 22
BUN: 26 — ABNORMAL HIGH
BUN: 33 — ABNORMAL HIGH
CO2: 20
CO2: 21
CO2: 23
CO2: 26
CO2: 29
CO2: 29
CO2: 29
CO2: 23
Calcium: 7.4 — ABNORMAL LOW
Calcium: 7.6 — ABNORMAL LOW
Calcium: 8 — ABNORMAL LOW
Calcium: 8.1 — ABNORMAL LOW
Calcium: 8 — ABNORMAL LOW
Calcium: 8.3 — ABNORMAL LOW
Calcium: 8.4
Chloride: 103
Chloride: 106
Chloride: 108
Chloride: 112
Chloride: 112
Chloride: 113 — ABNORMAL HIGH
Creatinine, Ser: 2.15 — ABNORMAL HIGH
Creatinine, Ser: 2.2 — ABNORMAL HIGH
Creatinine, Ser: 2.21 — ABNORMAL HIGH
Creatinine, Ser: 2.24 — ABNORMAL HIGH
Creatinine, Ser: 2.73 — ABNORMAL HIGH
Creatinine, Ser: 3.37 — ABNORMAL HIGH
Creatinine, Ser: 3.4 — ABNORMAL HIGH
Creatinine, Ser: 2.2 — ABNORMAL HIGH
Creatinine, Ser: 2.77 — ABNORMAL HIGH
GFR calc Af Amer: 20 — ABNORMAL LOW
GFR calc Af Amer: 22 — ABNORMAL LOW
GFR calc Af Amer: 30 — ABNORMAL LOW
GFR calc Af Amer: 38 — ABNORMAL LOW
GFR calc Af Amer: 40 — ABNORMAL LOW
GFR calc Af Amer: 38 — ABNORMAL LOW
GFR calc non Af Amer: 25 — ABNORMAL LOW
GFR calc non Af Amer: 31 — ABNORMAL LOW
GFR calc non Af Amer: 31 — ABNORMAL LOW
GFR calc non Af Amer: 32 — ABNORMAL LOW
GFR calc non Af Amer: 24 — ABNORMAL LOW
GFR calc non Af Amer: 31 — ABNORMAL LOW
Glucose, Bld: 100 — ABNORMAL HIGH
Glucose, Bld: 115 — ABNORMAL HIGH
Glucose, Bld: 120 — ABNORMAL HIGH
Glucose, Bld: 98
Glucose, Bld: 159 — ABNORMAL HIGH
Glucose, Bld: 93
Potassium: 4
Potassium: 4.5
Potassium: 4.7
Potassium: 6.1 — ABNORMAL HIGH
Sodium: 137
Sodium: 138
Sodium: 139

## 2011-05-06 LAB — URINALYSIS, ROUTINE W REFLEX MICROSCOPIC
Bilirubin Urine: NEGATIVE
Bilirubin Urine: NEGATIVE
Ketones, ur: NEGATIVE
Ketones, ur: NEGATIVE
Leukocytes, UA: NEGATIVE
Leukocytes, UA: NEGATIVE
Nitrite: NEGATIVE
Nitrite: NEGATIVE
Nitrite: NEGATIVE
Protein, ur: >300 — AB
Specific Gravity, Urine: 1.02
Specific Gravity, Urine: 1.012
Urobilinogen, UA: 0.2
Urobilinogen, UA: 0.2
pH: 6

## 2011-05-06 LAB — RENAL FUNCTION PANEL
Albumin: 1.6 — ABNORMAL LOW
BUN: 25 — ABNORMAL HIGH
Calcium: 7.6 — ABNORMAL LOW
Creatinine, Ser: 2.25 — ABNORMAL HIGH
GFR calc Af Amer: 37 — ABNORMAL LOW
GFR calc non Af Amer: 31 — ABNORMAL LOW
Phosphorus: 4.4

## 2011-05-06 LAB — POCT I-STAT, CHEM 8
Calcium, Ion: 0.99 — ABNORMAL LOW
Chloride: 115 — ABNORMAL HIGH
Creatinine, Ser: 2.3 — ABNORMAL HIGH
Glucose, Bld: 91
Potassium: 4.3

## 2011-05-06 LAB — HEPATITIS PANEL, ACUTE
Hep A IgM: NEGATIVE
Hepatitis B Surface Ag: NEGATIVE

## 2011-05-06 LAB — MAGNESIUM: Magnesium: 1.7

## 2011-05-06 LAB — POCT I-STAT 3, ART BLOOD GAS (G3+)
Acid-base deficit: 4 — ABNORMAL HIGH
Acid-base deficit: 5 — ABNORMAL HIGH
O2 Saturation: 88
Patient temperature: 98.3
pO2, Arterial: 46 — ABNORMAL LOW

## 2011-05-06 LAB — COMPREHENSIVE METABOLIC PANEL
ALT: 31
ALT: 37
AST: 42 — ABNORMAL HIGH
AST: 44 — ABNORMAL HIGH
Albumin: 2.1 — ABNORMAL LOW
Alkaline Phosphatase: 55
BUN: 15
CO2: 26
CO2: 27
CO2: 21
Calcium: 8.2 — ABNORMAL LOW
Calcium: 8 — ABNORMAL LOW
Chloride: 105
Chloride: 114 — ABNORMAL HIGH
Creatinine, Ser: 2.73 — ABNORMAL HIGH
Creatinine, Ser: 2.3 — ABNORMAL HIGH
Creatinine, Ser: 2.47 — ABNORMAL HIGH
GFR calc Af Amer: 28 — ABNORMAL LOW
GFR calc Af Amer: 30 — ABNORMAL LOW
GFR calc Af Amer: 33 — ABNORMAL LOW
GFR calc non Af Amer: 23 — ABNORMAL LOW
GFR calc non Af Amer: 25 — ABNORMAL LOW
GFR calc non Af Amer: 28 — ABNORMAL LOW
Glucose, Bld: 115 — ABNORMAL HIGH
Potassium: 3.8
Sodium: 137
Sodium: 139
Total Bilirubin: 0.5
Total Bilirubin: 0.6
Total Protein: 6.1
Total Protein: 5.3 — ABNORMAL LOW

## 2011-05-06 LAB — URINE CULTURE: Special Requests: NEGATIVE

## 2011-05-06 LAB — HEPATIC FUNCTION PANEL
ALT: 52
AST: 47 — ABNORMAL HIGH
Albumin: 2 — ABNORMAL LOW
Albumin: 2 — ABNORMAL LOW
Alkaline Phosphatase: 85
Total Protein: 5.7 — ABNORMAL LOW
Total Protein: 5.5 — ABNORMAL LOW

## 2011-05-06 LAB — RAPID URINE DRUG SCREEN, HOSP PERFORMED
Amphetamines: NOT DETECTED
Barbiturates: NOT DETECTED
Benzodiazepines: POSITIVE — AB
Opiates: NOT DETECTED

## 2011-05-06 LAB — LIPID PANEL
Cholesterol: 178
HDL: 54
LDL Cholesterol: 109 — ABNORMAL HIGH

## 2011-05-06 LAB — DIFFERENTIAL
Basophils Absolute: 0.1
Basophils Relative: 1
Basophils Relative: 2 — ABNORMAL HIGH
Eosinophils Absolute: 0
Lymphocytes Relative: 53 — ABNORMAL HIGH
Lymphs Abs: 1.1
Monocytes Absolute: 0.2
Monocytes Absolute: 0.5
Monocytes Relative: 3
Neutro Abs: 4.8
Neutro Abs: 6.1
Neutrophils Relative %: 83 — ABNORMAL HIGH
Neutrophils Relative %: 33 — ABNORMAL LOW

## 2011-05-06 LAB — URINE MICROSCOPIC-ADD ON: Urine-Other: NONE SEEN

## 2011-05-06 LAB — BLOOD GAS, ARTERIAL
Bicarbonate: 19.9 — ABNORMAL LOW
Drawn by: 229971
PEEP: 6
Pressure support: 12
pCO2 arterial: 49.3 — ABNORMAL HIGH
pO2, Arterial: 76.9 — ABNORMAL LOW

## 2011-05-06 LAB — IRON AND TIBC
Iron: 57
TIBC: 223

## 2011-05-06 LAB — SEDIMENTATION RATE: Sed Rate: 84 — ABNORMAL HIGH

## 2011-05-06 LAB — CREATININE, URINE, RANDOM: Creatinine, Urine: 69.6

## 2011-05-06 LAB — AMYLASE: Amylase: 94

## 2011-05-06 LAB — OCCULT BLOOD X 1 CARD TO LAB, STOOL: Fecal Occult Bld: NEGATIVE

## 2011-05-06 LAB — HIV 1/2 CONFIRMATION
HIV-1 antibody: NEGATIVE
HIV-2 Ab: NEGATIVE

## 2011-05-07 LAB — COMPREHENSIVE METABOLIC PANEL
ALT: 69 — ABNORMAL HIGH
AST: 104 — ABNORMAL HIGH
Albumin: 2.9 — ABNORMAL LOW
Alkaline Phosphatase: 70
Alkaline Phosphatase: 92
BUN: 20
BUN: 16
BUN: 19
CO2: 23
CO2: 22
CO2: 24
Calcium: 8.3 — ABNORMAL LOW
Calcium: 7.7 — ABNORMAL LOW
Calcium: 8.3 — ABNORMAL LOW
Chloride: 104
Creatinine, Ser: 3.26 — ABNORMAL HIGH
Creatinine, Ser: 3.14 — ABNORMAL HIGH
Creatinine, Ser: 3.1 — ABNORMAL HIGH
GFR calc Af Amer: 24 — ABNORMAL LOW
GFR calc Af Amer: 26 — ABNORMAL LOW
GFR calc non Af Amer: 20 — ABNORMAL LOW
GFR calc non Af Amer: 21 — ABNORMAL LOW
GFR calc non Af Amer: 22 — ABNORMAL LOW
Glucose, Bld: 92
Glucose, Bld: 112 — ABNORMAL HIGH
Glucose, Bld: 121 — ABNORMAL HIGH
Glucose, Bld: 78
Potassium: 5.3 — ABNORMAL HIGH
Potassium: 4.9
Sodium: 135
Total Bilirubin: 0.4
Total Protein: 6.9

## 2011-05-07 LAB — CBC
HCT: 31.4 — ABNORMAL LOW
HCT: 30.2 — ABNORMAL LOW
Hemoglobin: 12.2 — ABNORMAL LOW
Hemoglobin: 10.8 — ABNORMAL LOW
Hemoglobin: 10 — ABNORMAL LOW
Hemoglobin: 10.6 — ABNORMAL LOW
Hemoglobin: 10.9 — ABNORMAL LOW
MCHC: 34.3
MCHC: 34.4
MCHC: 34.1
MCHC: 34.9
MCV: 89.6
MCV: 90.5
MCV: 90.8
MCV: 91
Platelets: 308
RBC: 3.97 — ABNORMAL LOW
RBC: 3.47 — ABNORMAL LOW
RBC: 3.23 — ABNORMAL LOW
RBC: 3.5 — ABNORMAL LOW
RBC: 3.58 — ABNORMAL LOW
RDW: 14.5
WBC: 1.8 — ABNORMAL LOW
WBC: 3.7 — ABNORMAL LOW
WBC: 4.5
WBC: 4.8

## 2011-05-07 LAB — DIFFERENTIAL
Basophils Absolute: 0
Basophils Absolute: 0
Basophils Relative: 2 — ABNORMAL HIGH
Basophils Relative: 1
Basophils Relative: 1
Eosinophils Absolute: 0
Eosinophils Absolute: 0
Eosinophils Absolute: 0.1
Eosinophils Relative: 3
Eosinophils Relative: 1
Eosinophils Relative: 1
Eosinophils Relative: 1
Lymphocytes Relative: 39
Lymphocytes Relative: 27
Lymphocytes Relative: 48 — ABNORMAL HIGH
Lymphs Abs: 1
Lymphs Abs: 1.4
Lymphs Abs: 0.9
Lymphs Abs: 1.8
Lymphs Abs: 2.2
Monocytes Absolute: 0.2
Monocytes Absolute: 0.6
Monocytes Relative: 9
Monocytes Relative: 10
Monocytes Relative: 7
Neutro Abs: 1.9
Neutro Abs: 1.5 — ABNORMAL LOW
Neutro Abs: 2.1
Neutrophils Relative %: 30 — ABNORMAL LOW
Neutrophils Relative %: 50
Neutrophils Relative %: 39 — ABNORMAL LOW
Neutrophils Relative %: 67
Neutrophils Relative %: 45

## 2011-05-07 LAB — BASIC METABOLIC PANEL
BUN: 23
BUN: 35 — ABNORMAL HIGH
CO2: 24
CO2: 24
CO2: 25
CO2: 27
Calcium: 7.8 — ABNORMAL LOW
Calcium: 7.9 — ABNORMAL LOW
Calcium: 8.1 — ABNORMAL LOW
Calcium: 8.6
Chloride: 110
Chloride: 105
Chloride: 105
Chloride: 106
Chloride: 109
Creatinine, Ser: 2.39 — ABNORMAL HIGH
Creatinine, Ser: 2.98 — ABNORMAL HIGH
GFR calc Af Amer: 31 — ABNORMAL LOW
GFR calc Af Amer: 26 — ABNORMAL LOW
GFR calc Af Amer: 27 — ABNORMAL LOW
GFR calc Af Amer: 32 — ABNORMAL LOW
GFR calc non Af Amer: 22 — ABNORMAL LOW
GFR calc non Af Amer: 22 — ABNORMAL LOW
GFR calc non Af Amer: 25 — ABNORMAL LOW
Glucose, Bld: 112 — ABNORMAL HIGH
Potassium: 4.7
Sodium: 141
Sodium: 134 — ABNORMAL LOW
Sodium: 134 — ABNORMAL LOW
Sodium: 136

## 2011-05-07 LAB — URINALYSIS, ROUTINE W REFLEX MICROSCOPIC
Bilirubin Urine: NEGATIVE
Glucose, UA: NEGATIVE
Glucose, UA: NEGATIVE
Glucose, UA: 100 — AB
Ketones, ur: NEGATIVE
Ketones, ur: NEGATIVE
Leukocytes, UA: NEGATIVE
Leukocytes, UA: NEGATIVE
Protein, ur: >300 — AB
Protein, ur: >300 — AB
Protein, ur: >300 — AB
pH: 6
pH: 6.5

## 2011-05-07 LAB — URINE MICROSCOPIC-ADD ON

## 2011-05-07 LAB — PROTIME-INR
INR: 1
INR: 1
Prothrombin Time: 12.8

## 2011-05-07 LAB — LIPASE, BLOOD
Lipase: 18
Lipase: 52

## 2011-05-07 LAB — CULTURE, BLOOD (ROUTINE X 2): Culture: NO GROWTH

## 2011-05-07 LAB — POCT TOXICOLOGY PANEL

## 2011-05-07 LAB — URINE CULTURE

## 2011-05-07 LAB — MAGNESIUM
Magnesium: 1.7
Magnesium: 1.8

## 2011-05-07 LAB — PATHOLOGIST SMEAR REVIEW

## 2011-05-07 LAB — PHOSPHORUS: Phosphorus: 4.7 — ABNORMAL HIGH

## 2011-05-07 LAB — HEPATIC FUNCTION PANEL
ALT: 87 — ABNORMAL HIGH
AST: 88 — ABNORMAL HIGH
Albumin: 2.8 — ABNORMAL LOW
Bilirubin, Direct: 0

## 2011-05-07 LAB — ACETAMINOPHEN LEVEL: Acetaminophen (Tylenol), Serum: <10 — ABNORMAL LOW

## 2011-05-07 LAB — CALCIUM: Calcium: 8 — ABNORMAL LOW

## 2011-05-17 LAB — BASIC METABOLIC PANEL
BUN: 14
BUN: 15
BUN: 19
BUN: 20
BUN: 18
CO2: 23
CO2: 23
CO2: 25
Calcium: 8.2 — ABNORMAL LOW
Calcium: 8.4
Chloride: 111
Chloride: 112
Chloride: 113 — ABNORMAL HIGH
Chloride: 113 — ABNORMAL HIGH
Creatinine, Ser: 1.39
Creatinine, Ser: 1.44
Creatinine, Ser: 1.47
GFR calc Af Amer: >60
GFR calc Af Amer: >60
GFR calc non Af Amer: 53 — ABNORMAL LOW
GFR calc non Af Amer: 54 — ABNORMAL LOW
Glucose, Bld: 109 — ABNORMAL HIGH
Potassium: 3.9
Potassium: 4.1
Potassium: 4.4
Sodium: 140
Sodium: 141

## 2011-05-17 LAB — I-STAT 8, (EC8 V) (CONVERTED LAB)
BUN: 28 — ABNORMAL HIGH
Bicarbonate: 16.3 — ABNORMAL LOW
Chloride: 114 — ABNORMAL HIGH
Glucose, Bld: 79
HCT: 33 — ABNORMAL LOW
Hemoglobin: 11.2 — ABNORMAL LOW
Operator id: 196461
Potassium: 4.5
Sodium: 143

## 2011-05-17 LAB — URINE MICROSCOPIC-ADD ON

## 2011-05-17 LAB — URINALYSIS, ROUTINE W REFLEX MICROSCOPIC
Ketones, ur: NEGATIVE
Leukocytes, UA: NEGATIVE
Leukocytes, UA: NEGATIVE
Nitrite: NEGATIVE
Nitrite: NEGATIVE
Nitrite: NEGATIVE
Protein, ur: >300 — AB
Specific Gravity, Urine: 1.007
Specific Gravity, Urine: 1.02
Specific Gravity, Urine: 1.012
Urobilinogen, UA: 0.2
Urobilinogen, UA: 0.2
pH: 5.5
pH: 6.5

## 2011-05-17 LAB — COMPREHENSIVE METABOLIC PANEL
AST: 39 — ABNORMAL HIGH
BUN: 27 — ABNORMAL HIGH
CO2: 18 — ABNORMAL LOW
Calcium: 8.3 — ABNORMAL LOW
Chloride: 111
Creatinine, Ser: 1.85 — ABNORMAL HIGH
GFR calc Af Amer: 47 — ABNORMAL LOW
GFR calc non Af Amer: 39 — ABNORMAL LOW
Total Bilirubin: 0.4

## 2011-05-17 LAB — ANA: Anti Nuclear Antibody(ANA): NEGATIVE

## 2011-05-17 LAB — DIFFERENTIAL
Basophils Absolute: 0
Basophils Absolute: 0
Basophils Relative: 0
Eosinophils Relative: 1
Lymphocytes Relative: 33
Lymphs Abs: 1.5
Neutro Abs: 1.3 — ABNORMAL LOW
Neutro Abs: 2.7
Neutrophils Relative %: 42 — ABNORMAL LOW

## 2011-05-17 LAB — CBC
HCT: 32 — ABNORMAL LOW
HCT: 33.1 — ABNORMAL LOW
HCT: 32.2 — ABNORMAL LOW
Hemoglobin: 11.4 — ABNORMAL LOW
MCHC: 34.6
MCHC: 34.7
MCHC: 35.3
MCHC: 34.8
MCHC: 35.3
MCV: 86.2
MCV: 86.8
MCV: 86.6
MCV: 87
Platelets: 157
Platelets: 160
Platelets: 161
Platelets: 177
Platelets: 221
RBC: 3.72 — ABNORMAL LOW
RBC: 3.81 — ABNORMAL LOW
RDW: 14.6 — ABNORMAL HIGH
RDW: 15.7 — ABNORMAL HIGH
WBC: 2.6 — ABNORMAL LOW
WBC: 2.9 — ABNORMAL LOW
WBC: 3.2 — ABNORMAL LOW

## 2011-05-17 LAB — LIPASE, BLOOD: Lipase: 63 — ABNORMAL HIGH

## 2011-05-17 LAB — RAPID URINE DRUG SCREEN, HOSP PERFORMED
Barbiturates: NOT DETECTED
Benzodiazepines: NOT DETECTED
Cocaine: NOT DETECTED
Opiates: NOT DETECTED

## 2011-05-17 LAB — HIV ANTIBODY (ROUTINE TESTING W REFLEX): HIV: NONREACTIVE

## 2011-05-17 LAB — MAGNESIUM: Magnesium: 1.9

## 2011-05-20 LAB — DIFFERENTIAL
Basophils Absolute: 0
Basophils Absolute: 0
Lymphocytes Relative: 54 — ABNORMAL HIGH
Lymphs Abs: 1.5
Monocytes Relative: 10
Monocytes Relative: 13 — ABNORMAL HIGH
Neutro Abs: 0.6 — ABNORMAL LOW
Neutro Abs: 0.8 — ABNORMAL LOW
Neutrophils Relative %: 26 — ABNORMAL LOW

## 2011-05-20 LAB — CBC
HCT: 34.4 — ABNORMAL LOW
HCT: 34.5 — ABNORMAL LOW
HCT: 35.4 — ABNORMAL LOW
HCT: 37.7 — ABNORMAL LOW
Hemoglobin: 12.1 — ABNORMAL LOW
Hemoglobin: 12.2 — ABNORMAL LOW
Hemoglobin: 12.4 — ABNORMAL LOW
Hemoglobin: 12.5 — ABNORMAL LOW
Hemoglobin: 13.3
MCHC: 35.1
MCHC: 35.3
MCHC: 35.4
MCV: 85.1
MCV: 85.5
MCV: 85.9
Platelets: 224
RBC: 4.02 — ABNORMAL LOW
RBC: 4.16 — ABNORMAL LOW
RBC: 4.41
RDW: 14.2 — ABNORMAL HIGH
RDW: 14.3 — ABNORMAL HIGH
RDW: 14.7 — ABNORMAL HIGH
WBC: 2.7 — ABNORMAL LOW
WBC: 3.3 — ABNORMAL LOW

## 2011-05-20 LAB — COMPREHENSIVE METABOLIC PANEL
AST: 24
AST: 29
Albumin: 3 — ABNORMAL LOW
Alkaline Phosphatase: 65
BUN: 10
BUN: 12
BUN: 15
CO2: 24
CO2: 28
Calcium: 8.6
Calcium: 9
Chloride: 107
Chloride: 109
Creatinine, Ser: 1.44
Creatinine, Ser: 1.51 — ABNORMAL HIGH
Creatinine, Ser: 1.74 — ABNORMAL HIGH
GFR calc Af Amer: 50 — ABNORMAL LOW
GFR calc Af Amer: 59 — ABNORMAL LOW
GFR calc non Af Amer: 49 — ABNORMAL LOW
GFR calc non Af Amer: 52 — ABNORMAL LOW
Glucose, Bld: 114 — ABNORMAL HIGH
Glucose, Bld: 94
Potassium: 4.4
Total Bilirubin: 0.5
Total Bilirubin: 0.9
Total Protein: 6.1
Total Protein: 7.8

## 2011-05-20 LAB — PROTIME-INR: INR: 1

## 2011-05-20 LAB — APTT: aPTT: 35

## 2011-05-20 LAB — PHOSPHORUS: Phosphorus: 3.7

## 2011-05-20 LAB — ETHANOL: Alcohol, Ethyl (B): 227 — ABNORMAL HIGH

## 2011-05-20 LAB — URINALYSIS, ROUTINE W REFLEX MICROSCOPIC
Bilirubin Urine: NEGATIVE
Ketones, ur: NEGATIVE
Nitrite: NEGATIVE
Protein, ur: >300 — AB
Urobilinogen, UA: 0.2

## 2011-05-20 LAB — PATHOLOGIST SMEAR REVIEW

## 2011-05-20 LAB — RAPID URINE DRUG SCREEN, HOSP PERFORMED
Amphetamines: NOT DETECTED
Benzodiazepines: POSITIVE — AB
Cocaine: NOT DETECTED
Opiates: NOT DETECTED
Tetrahydrocannabinol: NOT DETECTED

## 2011-05-20 LAB — MAGNESIUM: Magnesium: 1.7

## 2011-05-20 LAB — SEDIMENTATION RATE: Sed Rate: 43 — ABNORMAL HIGH

## 2011-05-20 LAB — CALCIUM: Calcium: 8 — ABNORMAL LOW

## 2011-05-21 LAB — DIFFERENTIAL
Basophils Absolute: 0
Basophils Relative: 0
Lymphocytes Relative: 20
Monocytes Absolute: 0.5
Neutro Abs: 4.3
Neutrophils Relative %: 70

## 2011-05-21 LAB — BASIC METABOLIC PANEL
Calcium: 8.3 — ABNORMAL LOW
Creatinine, Ser: 1.66 — ABNORMAL HIGH
GFR calc Af Amer: 53 — ABNORMAL LOW
GFR calc non Af Amer: 44 — ABNORMAL LOW
Glucose, Bld: 110 — ABNORMAL HIGH
Sodium: 136

## 2011-05-21 LAB — CBC
Hemoglobin: 11.1 — ABNORMAL LOW
MCHC: 35.2
RBC: 3.66 — ABNORMAL LOW
RDW: 13.6

## 2011-05-21 LAB — RENAL FUNCTION PANEL
Albumin: 1.8 — ABNORMAL LOW
Chloride: 102
GFR calc Af Amer: 55 — ABNORMAL LOW
GFR calc non Af Amer: 45 — ABNORMAL LOW
Potassium: 4.7

## 2011-05-21 LAB — HEPATITIS PANEL, ACUTE
HCV Ab: POSITIVE — AB
Hep A IgM: NEGATIVE
Hepatitis B Surface Ag: NEGATIVE

## 2011-05-21 LAB — HEPATITIS C ANTIBODY: HCV Ab: POSITIVE — AB

## 2011-05-22 LAB — BASIC METABOLIC PANEL
BUN: 10
BUN: 12
BUN: 19
BUN: 8
CO2: 25
CO2: 26
Chloride: 100
Chloride: 103
Chloride: 104
Creatinine, Ser: 1.48
Creatinine, Ser: 2.23 — ABNORMAL HIGH
GFR calc non Af Amer: 54 — ABNORMAL LOW
GFR calc non Af Amer: 57 — ABNORMAL LOW
Glucose, Bld: 104 — ABNORMAL HIGH
Glucose, Bld: 108 — ABNORMAL HIGH
Glucose, Bld: 93
Potassium: 4.3
Potassium: 5

## 2011-05-22 LAB — RAPID URINE DRUG SCREEN, HOSP PERFORMED
Amphetamines: POSITIVE — AB
Benzodiazepines: POSITIVE — AB
Cocaine: NOT DETECTED
Opiates: POSITIVE — AB
Tetrahydrocannabinol: NOT DETECTED

## 2011-05-22 LAB — URINE CULTURE: Colony Count: NO GROWTH

## 2011-05-22 LAB — COMPREHENSIVE METABOLIC PANEL
ALT: 34
AST: 42 — ABNORMAL HIGH
Albumin: 2.2 — ABNORMAL LOW
BUN: 24 — ABNORMAL HIGH
CO2: 20
Calcium: 7.8 — ABNORMAL LOW
Calcium: 8.1 — ABNORMAL LOW
Chloride: 108
Creatinine, Ser: 1.53 — ABNORMAL HIGH
GFR calc Af Amer: 31 — ABNORMAL LOW
GFR calc non Af Amer: 25 — ABNORMAL LOW
Sodium: 136
Total Bilirubin: 0.4
Total Bilirubin: 0.5
Total Protein: 5.6 — ABNORMAL LOW

## 2011-05-22 LAB — CBC
HCT: 37 — ABNORMAL LOW
MCHC: 34.7
MCV: 86.6
Platelets: 121 — ABNORMAL LOW
RBC: 4.56
RDW: 14.2 — ABNORMAL HIGH
WBC: 5.4

## 2011-05-22 LAB — LIPID PANEL
Cholesterol: 191
HDL: 65
LDL Cholesterol: 107 — ABNORMAL HIGH
Total CHOL/HDL Ratio: 2.9
Triglycerides: 95

## 2011-05-22 LAB — LIPASE, BLOOD: Lipase: 18

## 2011-05-22 LAB — URINE MICROSCOPIC-ADD ON

## 2011-05-22 LAB — URINALYSIS, ROUTINE W REFLEX MICROSCOPIC
Bilirubin Urine: NEGATIVE
Bilirubin Urine: NEGATIVE
Glucose, UA: NEGATIVE
Hgb urine dipstick: NEGATIVE
Ketones, ur: NEGATIVE
Ketones, ur: NEGATIVE
Nitrite: NEGATIVE
pH: 5.5
pH: 6

## 2011-05-22 LAB — DIFFERENTIAL
Eosinophils Absolute: 0
Eosinophils Relative: 1
Lymphs Abs: 2.1

## 2011-05-22 LAB — PROTEIN, URINE, 24 HOUR: Urine Total Volume-UPROT: 2450

## 2011-06-15 IMAGING — CR DG CHEST 2V
2 series · 2 of 2 positions shown · non-contrast
Comparison: 09/28/2010

CLINICAL DATA: Chronic pancreatitis.  Short of breath. Renal
failure on dialysis.

CHEST - 2 VIEW

[w chest lat]
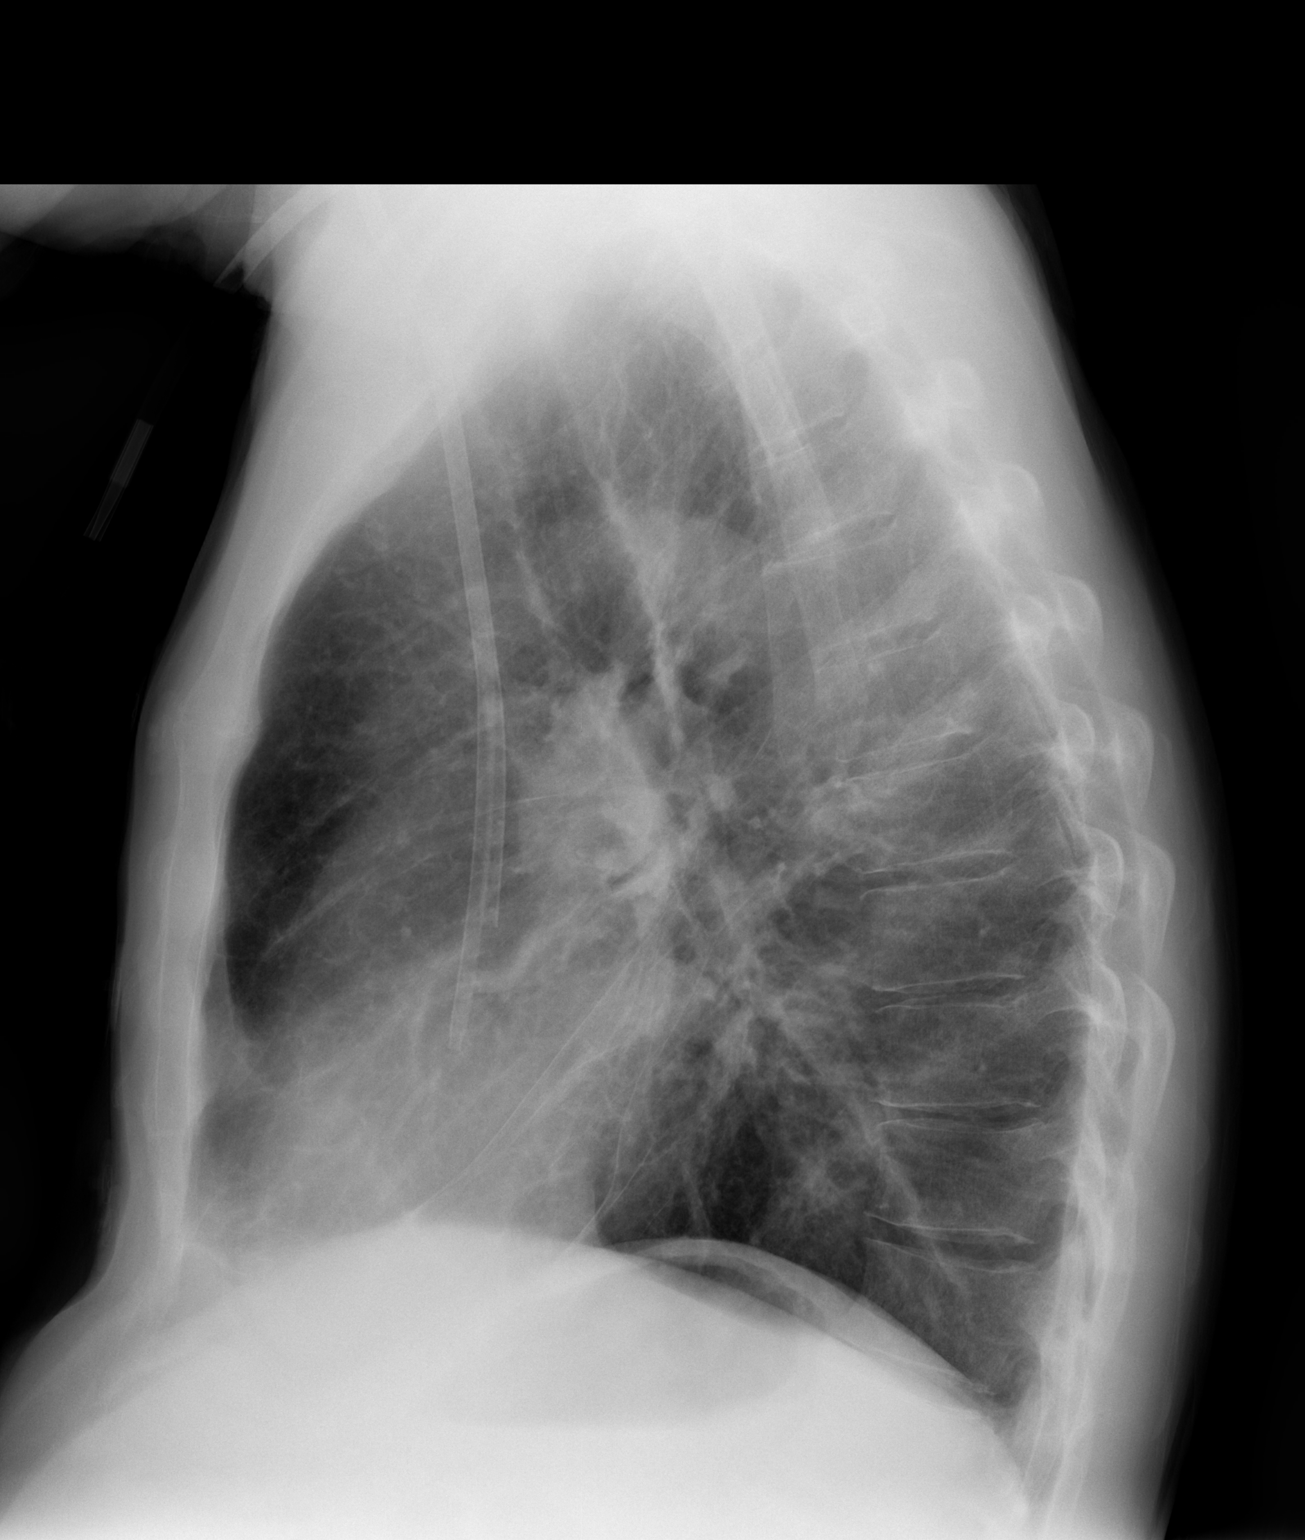

[w chest pa]
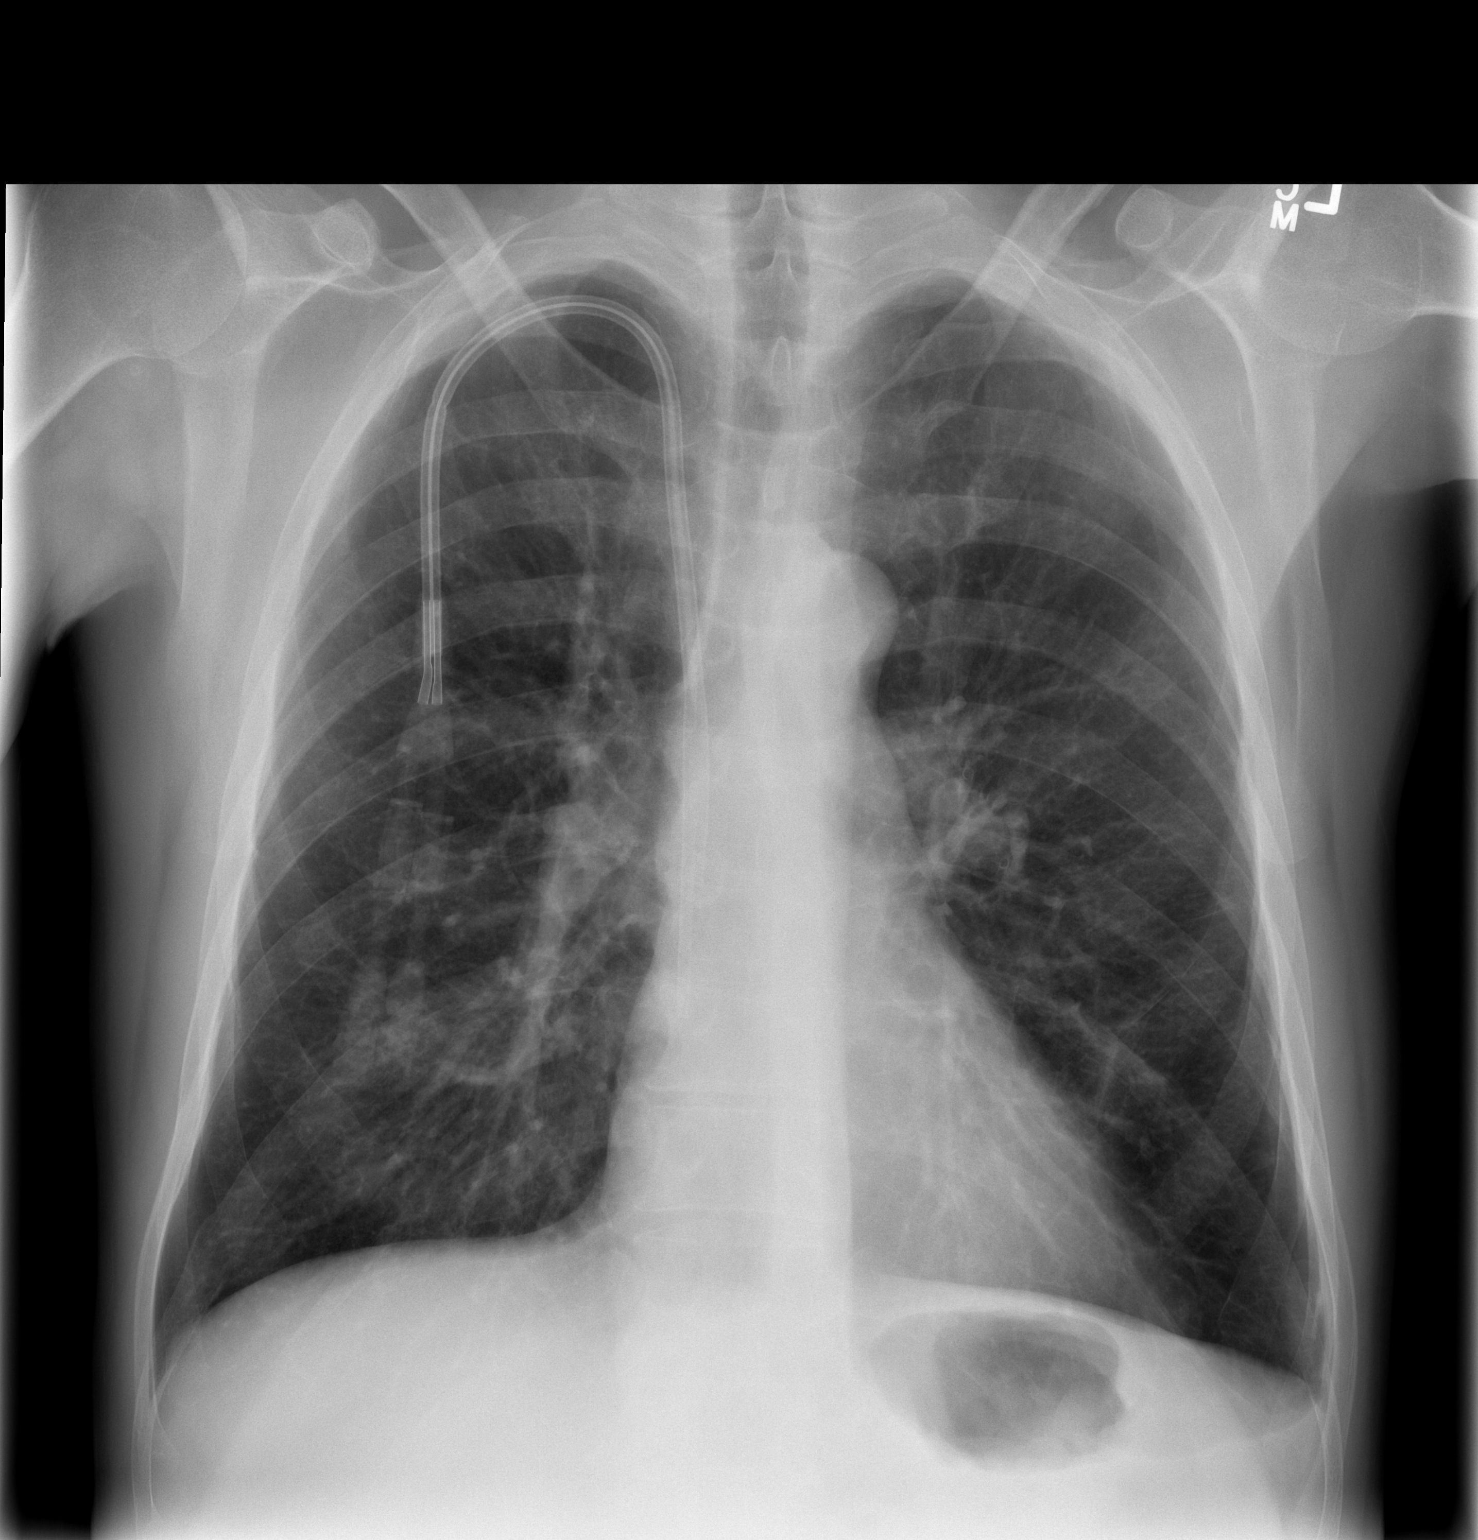

[2 of 2 positions shown; findings below may reference images not displayed]

FINDINGS: Left pleural effusion has resolved since prior exam.
Heart size is normal.  Both lungs are clear.  No evidence of
pleural effusion.  No mass or adenopathy identified.  Right jugular
dual lumen center venous catheter remains in appropriate position.
IMPRESSION: No active disease.

## 2011-07-11 ENCOUNTER — Other Ambulatory Visit: Payer: Self-pay | Admitting: *Deleted

## 2011-07-12 ENCOUNTER — Other Ambulatory Visit (HOSPITAL_COMMUNITY): Payer: Self-pay | Admitting: Nephrology

## 2011-07-12 DIAGNOSIS — N186 End stage renal disease: Secondary | ICD-10-CM

## 2011-07-13 ENCOUNTER — Ambulatory Visit (HOSPITAL_COMMUNITY)
Admission: RE | Admit: 2011-07-13 | Discharge: 2011-07-13 | Disposition: A | Discharge status: Home / Self Care | Payer: Medicare Other | Source: Ambulatory Visit | Attending: Nephrology | Admitting: Nephrology

## 2011-07-13 ENCOUNTER — Encounter (HOSPITAL_COMMUNITY): Payer: Self-pay

## 2011-07-13 ENCOUNTER — Emergency Department (HOSPITAL_COMMUNITY): Payer: Medicare Other

## 2011-07-13 ENCOUNTER — Other Ambulatory Visit (HOSPITAL_COMMUNITY): Payer: Self-pay | Admitting: Nephrology

## 2011-07-13 ENCOUNTER — Encounter (HOSPITAL_COMMUNITY): Payer: Self-pay | Admitting: Emergency Medicine

## 2011-07-13 ENCOUNTER — Inpatient Hospital Stay (HOSPITAL_COMMUNITY)
Admission: EM | Admit: 2011-07-13 | Discharge: 2011-07-16 | DRG: 673 | Disposition: A | Discharge status: Home / Self Care | Payer: Medicare Other | Attending: Internal Medicine | Admitting: Internal Medicine

## 2011-07-13 ENCOUNTER — Other Ambulatory Visit: Payer: Self-pay

## 2011-07-13 VITALS — BP 175/111 | HR 87 | Resp 20

## 2011-07-13 DIAGNOSIS — N186 End stage renal disease: Secondary | ICD-10-CM

## 2011-07-13 DIAGNOSIS — K859 Acute pancreatitis without necrosis or infection, unspecified: Secondary | ICD-10-CM

## 2011-07-13 DIAGNOSIS — J449 Chronic obstructive pulmonary disease, unspecified: Secondary | ICD-10-CM | POA: Diagnosis present

## 2011-07-13 DIAGNOSIS — J4489 Other specified chronic obstructive pulmonary disease: Secondary | ICD-10-CM | POA: Diagnosis present

## 2011-07-13 DIAGNOSIS — F1020 Alcohol dependence, uncomplicated: Secondary | ICD-10-CM | POA: Diagnosis present

## 2011-07-13 DIAGNOSIS — E872 Acidosis, unspecified: Secondary | ICD-10-CM | POA: Diagnosis present

## 2011-07-13 DIAGNOSIS — J810 Acute pulmonary edema: Secondary | ICD-10-CM

## 2011-07-13 DIAGNOSIS — R0603 Acute respiratory distress: Secondary | ICD-10-CM | POA: Diagnosis present

## 2011-07-13 DIAGNOSIS — I120 Hypertensive chronic kidney disease with stage 5 chronic kidney disease or end stage renal disease: Principal | ICD-10-CM | POA: Diagnosis present

## 2011-07-13 DIAGNOSIS — K861 Other chronic pancreatitis: Secondary | ICD-10-CM | POA: Diagnosis present

## 2011-07-13 DIAGNOSIS — Y849 Medical procedure, unspecified as the cause of abnormal reaction of the patient, or of later complication, without mention of misadventure at the time of the procedure: Secondary | ICD-10-CM | POA: Insufficient documentation

## 2011-07-13 DIAGNOSIS — E876 Hypokalemia: Secondary | ICD-10-CM | POA: Diagnosis not present

## 2011-07-13 DIAGNOSIS — F1010 Alcohol abuse, uncomplicated: Secondary | ICD-10-CM | POA: Clinically undetermined

## 2011-07-13 DIAGNOSIS — T82898A Other specified complication of vascular prosthetic devices, implants and grafts, initial encounter: Secondary | ICD-10-CM | POA: Insufficient documentation

## 2011-07-13 DIAGNOSIS — R1084 Generalized abdominal pain: Secondary | ICD-10-CM | POA: Diagnosis present

## 2011-07-13 DIAGNOSIS — Z992 Dependence on renal dialysis: Secondary | ICD-10-CM

## 2011-07-13 DIAGNOSIS — E875 Hyperkalemia: Secondary | ICD-10-CM | POA: Diagnosis present

## 2011-07-13 DIAGNOSIS — T85698A Other mechanical complication of other specified internal prosthetic devices, implants and grafts, initial encounter: Secondary | ICD-10-CM | POA: Diagnosis present

## 2011-07-13 DIAGNOSIS — J811 Chronic pulmonary edema: Secondary | ICD-10-CM | POA: Diagnosis present

## 2011-07-13 DIAGNOSIS — Z8619 Personal history of other infectious and parasitic diseases: Secondary | ICD-10-CM

## 2011-07-13 LAB — COMPREHENSIVE METABOLIC PANEL
AST: 61 U/L — ABNORMAL HIGH (ref 0–37)
CO2: 18 mEq/L — ABNORMAL LOW (ref 19–32)
Calcium: 9.3 mg/dL (ref 8.4–10.5)
Creatinine, Ser: 10.01 mg/dL — ABNORMAL HIGH (ref 0.50–1.35)
GFR calc non Af Amer: 5 mL/min — ABNORMAL LOW (ref >90)

## 2011-07-13 LAB — DIFFERENTIAL
Basophils Absolute: 0 10*3/uL (ref 0.0–0.1)
Basophils Relative: 1 % (ref 0–1)
Eosinophils Relative: 2 % (ref 0–5)
Lymphocytes Relative: 16 % (ref 12–46)
Monocytes Absolute: 0.2 10*3/uL (ref 0.1–1.0)

## 2011-07-13 LAB — POCT I-STAT, CHEM 8
Calcium, Ion: 1.11 mmol/L — ABNORMAL LOW (ref 1.12–1.32)
Chloride: 109 mEq/L (ref 96–112)
HCT: 40 % (ref 39.0–52.0)

## 2011-07-13 LAB — CBC
HCT: 36.6 % — ABNORMAL LOW (ref 39.0–52.0)
MCH: 32.1 pg (ref 26.0–34.0)
MCHC: 33.6 g/dL (ref 30.0–36.0)
MCV: 95.6 fL (ref 78.0–100.0)
RDW: 19.4 % — ABNORMAL HIGH (ref 11.5–15.5)

## 2011-07-13 LAB — CARDIAC PANEL(CRET KIN+CKTOT+MB+TROPI)
CK, MB: 6.7 ng/mL (ref 0.3–4.0)
Total CK: 136 U/L (ref 7–232)

## 2011-07-13 LAB — APTT: aPTT: 35 seconds (ref 24–37)

## 2011-07-13 MED ORDER — SODIUM BICARBONATE 8.4 % IV SOLN
INTRAVENOUS | Status: AC
  Administered 2011-07-13: 50 meq via INTRAVENOUS
  Filled 2011-07-13: qty 50

## 2011-07-13 MED ORDER — LIDOCAINE HCL (PF) 1 % IJ SOLN
5.0000 mL | INTRAMUSCULAR | Status: DC | PRN
  Filled 2011-07-13: qty 5

## 2011-07-13 MED ORDER — INSULIN ASPART 100 UNIT/ML ~~LOC~~ SOLN
10.0000 [IU] | Freq: Once | SUBCUTANEOUS | Status: AC
  Administered 2011-07-13: 10 [IU] via INTRAVENOUS
  Filled 2011-07-13: qty 1

## 2011-07-13 MED ORDER — ALTEPLASE 100 MG IV SOLR
2.0000 mg | Freq: Once | INTRAVENOUS | Status: DC
Start: 2011-07-13 — End: 2011-07-14
  Filled 2011-07-13: qty 2

## 2011-07-13 MED ORDER — MIDAZOLAM HCL 2 MG/2ML IJ SOLN
1.0000 mg | INTRAMUSCULAR | Status: DC | PRN
  Filled 2011-07-13: qty 10

## 2011-07-13 MED ORDER — MORPHINE SULFATE 2 MG/ML IJ SOLN
2.0000 mg | INTRAMUSCULAR | Status: DC | PRN
  Administered 2011-07-13 – 2011-07-16 (×14): 2 mg via INTRAVENOUS
  Filled 2011-07-13 (×12): qty 1

## 2011-07-13 MED ORDER — HYDRALAZINE HCL 20 MG/ML IJ SOLN
INTRAMUSCULAR | Status: AC
  Filled 2011-07-13: qty 1

## 2011-07-13 MED ORDER — CLONIDINE HCL 0.1 MG PO TABS
0.1000 mg | ORAL_TABLET | Freq: Once | ORAL | Status: AC
  Administered 2011-07-13: 0.1 mg via ORAL
  Filled 2011-07-13: qty 1

## 2011-07-13 MED ORDER — HEPARIN SODIUM (PORCINE) 1000 UNIT/ML IJ SOLN
INTRAMUSCULAR | Status: AC
  Filled 2011-07-13: qty 1

## 2011-07-13 MED ORDER — IOHEXOL 300 MG/ML  SOLN: 32.0000 mL | Freq: Once | INTRAMUSCULAR | Status: DC | PRN

## 2011-07-13 MED ORDER — HEPARIN SODIUM (PORCINE) 1000 UNIT/ML DIALYSIS
1000.0000 [IU] | INTRAMUSCULAR | Status: DC | PRN
  Filled 2011-07-13: qty 1

## 2011-07-13 MED ORDER — PENTAFLUOROPROP-TETRAFLUOROETH EX AERO: 1.0000 "application " | INHALATION_SPRAY | CUTANEOUS | Status: DC | PRN

## 2011-07-13 MED ORDER — FENTANYL CITRATE 0.05 MG/ML IJ SOLN
INTRAMUSCULAR | Status: AC
  Filled 2011-07-13: qty 4

## 2011-07-13 MED ORDER — LIDOCAINE-PRILOCAINE 2.5-2.5 % EX CREA: 1.0000 "application " | TOPICAL_CREAM | CUTANEOUS | Status: DC | PRN

## 2011-07-13 MED ORDER — ONDANSETRON HCL 4 MG/2ML IJ SOLN
4.0000 mg | Freq: Once | INTRAMUSCULAR | Status: AC
  Administered 2011-07-13: 4 mg via INTRAVENOUS
  Filled 2011-07-13: qty 2

## 2011-07-13 MED ORDER — HYDROMORPHONE HCL PF 1 MG/ML IJ SOLN
1.0000 mg | Freq: Once | INTRAMUSCULAR | Status: AC
  Administered 2011-07-13: 1 mg via INTRAVENOUS
  Filled 2011-07-13: qty 1

## 2011-07-13 MED ORDER — FENTANYL CITRATE 0.05 MG/ML IJ SOLN
25.0000 ug | INTRAMUSCULAR | Status: DC | PRN
Start: 2011-07-13 — End: 2011-07-14

## 2011-07-13 MED ORDER — SODIUM CHLORIDE 0.9 % IV SOLN: 100.0000 mL | INTRAVENOUS | Status: DC | PRN

## 2011-07-13 MED ORDER — DEXTROSE 50 % IV SOLN
25.0000 g | Freq: Once | INTRAVENOUS | Status: AC
  Administered 2011-07-13: 25 g via INTRAVENOUS
  Filled 2011-07-13: qty 50

## 2011-07-13 MED ORDER — ALTEPLASE 2 MG IJ SOLR
2.0000 mg | Freq: Once | INTRAMUSCULAR | Status: AC | PRN
  Filled 2011-07-13: qty 2

## 2011-07-13 MED ORDER — SODIUM CHLORIDE 0.9 % IV SOLN
1.0000 g | INTRAVENOUS | Status: AC
  Administered 2011-07-13: 1 g via INTRAVENOUS
  Filled 2011-07-13: qty 10

## 2011-07-13 MED ORDER — NEPRO/CARBSTEADY PO LIQD
237.0000 mL | ORAL | Status: DC | PRN
  Filled 2011-07-13: qty 237

## 2011-07-13 MED ORDER — SODIUM BICARBONATE 8.4 % IV SOLN
50.0000 meq | Freq: Once | INTRAVENOUS | Status: AC
  Administered 2011-07-13: 50 meq via INTRAVENOUS

## 2011-07-13 MED ORDER — MIDAZOLAM HCL 2 MG/2ML IJ SOLN
INTRAMUSCULAR | Status: AC
  Filled 2011-07-13: qty 6

## 2011-07-13 MED ORDER — HEPARIN SODIUM (PORCINE) 1000 UNIT/ML DIALYSIS
100.0000 [IU]/kg | INTRAMUSCULAR | Status: DC | PRN
  Administered 2011-07-14: 7000 [IU] via INTRAVENOUS_CENTRAL

## 2011-07-13 MED ORDER — SODIUM BICARBONATE 4.2 % IV SOLN: 50.0000 meq | Freq: Once | INTRAVENOUS | Status: DC

## 2011-07-13 NOTE — ED Notes (Signed)
Pt reports shortness of breath and pancreatitis. Pt is a dialysis pt and did not have dialysis on Friday due to clotted graft.

## 2011-07-13 NOTE — ED Notes (Addendum)
Pt graft open. Pt procedure for declot cancelled Pt did not receive any sedation meds wasted

## 2011-07-13 NOTE — Consults (Addendum)
[name] is an 55 y.o. male.  HPI: He presents with SOB to the ED. He was in resp distress, but feels somewhat better on facemask 02.  He says he had a partial HD on Wed with poor blood flows, then no HD yesterday due to malfunctioning AVF.  Patient says that today he underwent a declot of his L forearm AVF in radiology, however, the radiology note from this am says that the AV graft was open and that the procedure for declot was cancelled.         Afterwards at home he developed wheezing and severe SOB.  He also has been having L sided abd pain.  No nausea or vomiting. No fevers or diarrhea. No productive cough. Dialyzes at [facility] on MWF schedule.   Past Medical History  Diagnosis Date  . Pancytopenia      chronic  . Polysubstance abuse     chronic most notable for alcohol  . End-stage renal disease on hemodialysis     HD on MWF, [facility]  . Malignant hypertension   . Hepatitis C   . COPD (chronic obstructive pulmonary disease)   . Chronic recurrent pancreatitis     likely secondary to alcoholism  . Smoker   . Alcohol abuse   . Respiratory failure Jan 2012    Hx of VDRF   . Alcohol withdrawal   . Pancreatitis, alcoholic   . Renal disorder     Allergies: No Known Allergies  Medications: {medication reviewed/display:[id number] Results for orders placed during the hospital encounter of 07/13/11 (from the past 48 hour(s))  CBC     Status: Abnormal   Collection Time   07/13/11  7:29 PM      Component Value Range Comment   WBC 4.3  4.0 - 10.5 (K/uL)    RBC 3.83 (*) 4.22 - 5.81 (MIL/uL)    Hemoglobin 12.3 (*) 13.0 - 17.0 (g/dL)    HCT 16.1 (*) 09.6 - 52.0 (%)    MCV 95.6  78.0 - 100.0 (fL)    MCH 32.1  26.0 - 34.0 (pg)    MCHC 33.6  30.0 - 36.0 (g/dL)    RDW 04.5 (*) 40.9 - 15.5 (%)    Platelets 92 (*) 150 - 400 (K/uL) PLATELET COUNT CONFIRMED BY SMEAR  DIFFERENTIAL     Status: Normal   Collection Time   07/13/11  7:29 PM      Component Value Range  Comment   Neutrophils Relative 77  43 - 77 (%)    Neutro Abs 3.3  1.7 - 7.7 (K/uL)    Lymphocytes Relative 16  12 - 46 (%)    Lymphs Abs 0.7  0.7 - 4.0 (K/uL)    Monocytes Relative 5  3 - 12 (%)    Monocytes Absolute 0.2  0.1 - 1.0 (K/uL)    Eosinophils Relative 2  0 - 5 (%)    Eosinophils Absolute 0.1  0.0 - 0.7 (K/uL)    Basophils Relative 1  0 - 1 (%)    Basophils Absolute 0.0  0.0 - 0.1 (K/uL)   COMPREHENSIVE METABOLIC PANEL     Status: Abnormal   Collection Time   07/13/11  7:29 PM      Component Value Range Comment   Sodium 133 (*) 135 - 145 (mEq/L)    Potassium 6.0 (*) 3.5 - 5.1 (mEq/L)    Chloride 96  96 - 112 (mEq/L)    CO2 18 (*) 19 -  32 (mEq/L)    Glucose, Bld 89  70 - 99 (mg/dL)    BUN 58 (*) 6 - 23 (mg/dL)    Creatinine, Ser 04.54 (*) 0.50 - 1.35 (mg/dL)    Calcium 9.3  8.4 - 10.5 (mg/dL)    Total Protein 7.7  6.0 - 8.3 (g/dL)    Albumin 3.0 (*) 3.5 - 5.2 (g/dL)    AST 61 (*) 0 - 37 (U/L)    ALT 49  0 - 53 (U/L)    Alkaline Phosphatase 82  39 - 117 (U/L)    Total Bilirubin 0.3  0.3 - 1.2 (mg/dL)    GFR calc non Af Amer 5 (*) >90 (mL/min)    GFR calc Af Amer 6 (*) >90 (mL/min)   LIPASE, BLOOD     Status: Normal   Collection Time   07/13/11  7:29 PM      Component Value Range Comment   Lipase 39  11 - 59 (U/L)   CARDIAC PANEL(CRET KIN+CKTOT+MB+TROPI)     Status: Abnormal   Collection Time   07/13/11  7:29 PM      Component Value Range Comment   Total CK 136  7 - 232 (U/L)    CK, MB 6.7 (*) 0.3 - 4.0 (ng/mL)    Troponin I <0.30  <0.30 (ng/mL)    Relative Index 4.9 (*) 0.0 - 2.5    APTT     Status: Normal   Collection Time   07/13/11  7:29 PM      Component Value Range Comment   aPTT 35  24 - 37 (seconds)   PROTIME-INR     Status: Normal   Collection Time   07/13/11  7:29 PM      Component Value Range Comment   Prothrombin Time 12.8  11.6 - 15.2 (seconds)    INR 0.94  0.00 - 1.49    POCT I-STAT, CHEM 8     Status: Abnormal   Collection Time   07/13/11  8:23  PM      Component Value Range Comment   Sodium 134 (*) 135 - 145 (mEq/L)    Potassium 6.2 (*) 3.5 - 5.1 (mEq/L)    Chloride 109  96 - 112 (mEq/L)    BUN 59 (*) 6 - 23 (mg/dL)    Creatinine, Ser 09.81 (*) 0.50 - 1.35 (mg/dL)    Glucose, Bld 91  70 - 99 (mg/dL)    Calcium, Ion 1.91 (*) 1.12 - 1.32 (mmol/L)    TCO2 20  0 - 100 (mmol/L)    Hemoglobin 13.6  13.0 - 17.0 (g/dL)    HCT 47.8  29.5 - 62.1 (%)     Dg Abd 1 View  07/13/2011  *RADIOLOGY REPORT*  Clinical Data: Abdominal pain and distension.  Shortness of breath and nausea.  ABDOMEN - 1 VIEW  Comparison: 09/27/2010  Findings: Supine views of the abdomen demonstrate gas and stool throughout the colon without small or large bowel distension.  No obvious wall thickening.  No radiopaque stones.  Calcified phleboliths in the pelvis.  Degenerative changes in the lumbar spine.  IMPRESSION: Stool filled colon.  Nonobstructive bowel gas pattern.  Original Report Authenticated By: Marlon Pel, M.D.   Dg Chest Port 1 View  07/13/2011  *RADIOLOGY REPORT*  Clinical Data: As of breath.  PORTABLE CHEST - 1 VIEW  Comparison: Portal chest 04/04/2011.  Findings: The heart is mildly enlarged.  Mild pulmonary vascular congestion is evident.  Minimal bibasilar atelectasis  is present. There is improved aeration at the right lung base.  The upper lung fields are clear.  The visualized soft tissues and bony thorax are unremarkable.  IMPRESSION:  1.  Borderline cardiomegaly and pulmonary vascular congestion. This could represent early congestive heart failure. 2.  Minimal bibasilar atelectasis.  Original Report Authenticated By: Jamesetta Orleans. MATTERN, M.D.   Ir Shuntogram/fistulagram Left  07/13/2011  *RADIOLOGY REPORT*  Clinical Data: Poor function of left arm straight graft  AV SHUNTOGRAM  Procedure:  The left arm was prepped and draped in a sterile fashion.  An 18 gauge Angiocath was inserted into the AV graft. Contrast was injected.  The Angiocath was  removed.  Hemostasis was achieved with direct pressure. No complication.  Findings: Arterial anastomosis, graft, venous anastomosis and central venous structures are all widely patent.  Small pseudoaneurysm in the distal graft is noted.  IMPRESSION: No significant stenosis in the left arm AV graft circuit.  Original Report Authenticated By: Donavan Burnet, M.D.    Blood pressure 190/108, pulse 104, temperature 99.6 F (37.6 C), temperature source Oral, resp. rate 26, SpO2 100.00%.  Gen- alert, breathing hard Neck - JVD Chest - bibasilar crackles and generally poor air movement throughout Cor - no rub or gallop, reg rhythm Abd-  Tender throughout with some voluntary guarding EXt-  + pitting edema bilat pretibial  Good bruit and thrill over LUA access Neuro -  Alert, ox3, nonfocal  Assessment/Plan: 1 Dypsnea and resp distress due to pulm edema superimposed on underlying COPD.  Has had poor HD this week due to access problems.  Needs acute HD tonight ASAP.  Use BiPap temporarily.   2. ESRD: see above 3. Hx COPD 4. Hx chronic pancreatitis 5. Hx prior etoh abuse 6. HTN, poorly controlled.  Remove volume with HD.  7.  Hyperkalemia > dialysis.  8.  Left AVG malfunction ? Declot this am (or possibly not according to IR notes).  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    Vivan Agostino D 07/13/2011, 11:28 PM

## 2011-07-13 NOTE — ED Provider Notes (Signed)
History     CSN: [id number] Arrival date & time: 07/13/2011  6:03 PM   First MD Initiated Contact with Patient 07/13/11 1911      Chief Complaint  Patient presents with  . Pancreatitis  . Shortness of Breath    (Consider location/radiation/quality/duration/timing/severity/associated sxs/prior treatment) HPI Comments: Patient is a dialysis patient and on Wednesday he was fistula begin to clot and he was unable to get dialysis on Friday. He got his fistula declotted today however when he arrived home he became very short of breath and developed abdominal pain.  Patient is a 55 y.o. male presenting with abdominal pain and shortness of breath. The history is provided by the patient.  Abdominal Pain The primary symptoms of the illness include abdominal pain, shortness of breath, nausea and vomiting. The primary symptoms of the illness do not include fever or diarrhea. The current episode started 3 to 5 hours ago. The onset of the illness was gradual. The problem has been gradually worsening.  The abdominal pain is located in the LUQ. The abdominal pain does not radiate. The severity of the abdominal pain is 9/10. The abdominal pain is relieved by nothing. The abdominal pain is exacerbated by vomiting.  The vomiting began today. Vomiting occurs 2 to 5 times per day. The emesis contains stomach contents (No blood).  Additional symptoms associated with the illness include anorexia. Associated medical issues comments: History pancreatitis.  Shortness of Breath  The current episode started today. The onset was sudden. The problem occurs continuously. The problem has been unchanged. The problem is moderate. The symptoms are relieved by rest (Sitting upright). The symptoms are aggravated by activity and a supine position. Associated symptoms include cough and shortness of breath. Pertinent negatives include no fever.    Past Medical History  Diagnosis Date  . Pancytopenia      chronic  .  Polysubstance abuse     chronic most notable for alcohol  . End-stage renal disease on hemodialysis   . Malignant hypertension   . Hepatitis C   . COPD (chronic obstructive pulmonary disease)   . Chronic recurrent pancreatitis     likely secondary to alcoholism  . Smoker   . Alcohol abuse   . Respiratory failure Jan 2012    Hx of VDRF   . Alcohol withdrawal   . Pancreatitis, alcoholic   . Renal disorder     Past Surgical History  Procedure Date  . Av fistula placement     Family History  Problem Relation Age of Onset  . Hypertension Mother   . Alcohol abuse    . Anxiety disorder    . Hyperlipidemia    . Stroke      History  Substance Use Topics  . Smoking status: Current Everyday Smoker -- 0.3 packs/day for 15 years    Types: Cigarettes  . Smokeless tobacco: Not on file  . Alcohol Use: 25.0 oz/week    50 drink(s) per week     02/28/11: currently not drinking, 04/04/11: drinking a fifth (25 ounces/75ccs) of vodka  3 days/week      Review of Systems  Constitutional: Negative for fever.  Respiratory: Positive for cough and shortness of breath.        Nonproductive cough  Gastrointestinal: Positive for nausea, vomiting, abdominal pain and anorexia. Negative for diarrhea.  All other systems reviewed and are negative.    Allergies  Review of patient's allergies indicates no known allergies.  Home Medications   Current Outpatient Rx  Name Route Sig Dispense Refill  . ALBUTEROL SULFATE HFA 108 (90 BASE) MCG/ACT IN AERS Inhalation Inhale 2 puffs into the lungs every 6 (six) hours as needed. For shortness of breath.    . AMYLASE-LIPASE-PROTEASE 33.09-15-35.5 MU PO CPEP Oral Take 3 capsules by mouth 3 (three) times daily with meals.      Marland Kitchen RENA-VITE RX 1 MG PO TABS Oral Take 1 tablet by mouth daily.      Marland Kitchen CALCIUM ACETATE 667 MG PO CAPS Oral Take 667 mg by mouth 3 (three) times daily with meals.      Marland Kitchen CLONIDINE HCL 0.2 MG PO TABS Oral Take 0.2 mg by mouth 2 (two)  times daily.      Marland Kitchen FOLIC ACID 1 MG PO TABS Oral Take 1 mg by mouth daily.      Marland Kitchen GABAPENTIN 100 MG PO CAPS Oral Take 100 mg by mouth 3 (three) times daily.      Marland Kitchen ONDANSETRON HCL 4 MG PO TABS Oral Take 4 mg by mouth every 8 (eight) hours as needed. For nausea.     Marland Kitchen PANTOPRAZOLE SODIUM 40 MG PO TBEC Oral Take 40 mg by mouth daily.      . THIAMINE HCL 100 MG PO TABS Oral Take 100 mg by mouth daily.      Marland Kitchen TIOTROPIUM BROMIDE MONOHYDRATE 18 MCG IN CAPS Inhalation Place 18 mcg into inhaler and inhale daily.        BP 189/106  Pulse 99  Temp(Src) 98.9 F (37.2 C) (Oral)  Resp 22  SpO2 92%  Physical Exam  Nursing note and vitals reviewed. Constitutional: He is oriented to person, place, and time. He appears well-developed and well-nourished. He appears distressed.  HENT:  Head: Normocephalic and atraumatic.  Mouth/Throat: Oropharynx is clear and moist.  Eyes: Conjunctivae and EOM are normal. Pupils are equal, round, and reactive to light.  Neck: Normal range of motion. Neck supple.  Cardiovascular: Normal rate, regular rhythm and intact distal pulses.   No murmur heard. Pulmonary/Chest: Effort normal. No respiratory distress. He has no wheezes. He has rales in the right lower field and the left lower field.  Abdominal: Soft. He exhibits no distension. There is tenderness in the left upper quadrant. There is guarding. There is no rebound and no CVA tenderness.  Musculoskeletal: Normal range of motion. He exhibits no edema and no tenderness.       Good thrill over the left AV fistula  Neurological: He is alert and oriented to person, place, and time.  Skin: Skin is warm and dry. No rash noted. No erythema.  Psychiatric: He has a normal mood and affect. His behavior is normal.    ED Course  Procedures (including critical care time)   Labs Reviewed  APTT  PROTIME-INR  CBC  DIFFERENTIAL  COMPREHENSIVE METABOLIC PANEL  LIPASE, BLOOD  I-STAT, CHEM 8  CARDIAC PANEL(CRET  KIN+CKTOT+MB+TROPI)   Ir Shuntogram/fistulagram Left  07/13/2011  *RADIOLOGY REPORT*  Clinical Data: Poor function of left arm straight graft  AV SHUNTOGRAM  Procedure:  The left arm was prepped and draped in a sterile fashion.  An 18 gauge Angiocath was inserted into the AV graft. Contrast was injected.  The Angiocath was removed.  Hemostasis was achieved with direct pressure. No complication.  Findings: Arterial anastomosis, graft, venous anastomosis and central venous structures are all widely patent.  Small pseudoaneurysm in the distal graft is noted.  IMPRESSION: No significant stenosis in the left arm AV graft circuit.  Original Report Authenticated By: Donavan Burnet, M.D.    Date: 07/13/2011  Rate: 96  Rhythm: normal sinus rhythm  QRS Axis: normal  Intervals: normal  ST/T Wave abnormalities: nonspecific ST/T changes  Conduction Disutrbances:none  Narrative Interpretation: peaked t-waves concerning for hyperkalemia  Old EKG Reviewed: changes noted    No diagnosis found.    MDM   Patient with history of end-stage renal disease on dialysis. His AV fistula clotted on Wednesday's session and he did not receive dialysis on Friday. Patient went to interventional radiology today and his graft was declotted however on arrival home he became extremely short of breath worse with lying down and exertion. Also he has a history of chronic pancreatitis and he began to develop severe left upper quadrant pain, and vomiting. Patient denies fever or productive cough. He denies chest pain.  On arrival here patient's O2 sats are 92% on room air he is hypertensive at 189/106. Patient is moderately distressed due to pain is in his abdomen. EKG showed peaked T waves concerning for hyperkalemia. We will get i-STAT, CBC, CMP, lipase, chest x-ray. On exam patient has rales three fourths of the way up. It seems to be fairly comfortable on oxygen at this time and do not feel that he needs BiPAP.   9:51  PM Plan come showed pulmonary congestion. I-STAT with hyperkalemia at 6.0. CBC within normal limits and lipase within normal limits. Patient with persistent abdominal pain and vomiting. Spoke with nephrology and they'll dialyze patient. Spoke with hospitalist will admit for persistent abdominal pain or vomiting.     Gwyneth Sprout, MD 07/13/11 2152

## 2011-07-13 NOTE — Procedures (Signed)
LUE AVG patent.

## 2011-07-13 NOTE — H&P (Signed)
[name] is an 55 y.o. male.   Chief Complaint: Clotted Lt upper arm graft; minimally palpable thrill and pulse HPI: Left arm graft shuntogram; possible thrombolysis/angioplasty/stent placement; possible dialysis catheter placement  Past Medical History  Diagnosis Date  . Pancytopenia      chronic  . Polysubstance abuse     chronic most notable for alcohol  . End-stage renal disease on hemodialysis   . Malignant hypertension   . Hepatitis C   . COPD (chronic obstructive pulmonary disease)   . Chronic recurrent pancreatitis     likely secondary to alcoholism  . Smoker   . Alcohol abuse   . Respiratory failure Jan 2012    Hx of VDRF   . Alcohol withdrawal   . Pancreatitis, alcoholic     History reviewed. No pertinent past surgical history.  Family History  Problem Relation Age of Onset  . Hypertension Mother   . Alcohol abuse    . Anxiety disorder    . Hyperlipidemia    . Stroke     Social History:  reports that he has been smoking Cigarettes.  He has a 4.5 pack-year smoking history. He does not have any smokeless tobacco history on file. He reports that he drinks about 25 ounces of alcohol per week. He reports that he does not use illicit drugs.  Allergies: No Known Allergies  Medications Prior to Admission  Medication Sig Dispense Refill  . albuterol (VENTOLIN HFA) 108 (90 BASE) MCG/ACT inhaler Inhale 2 puffs into the lungs every 6 (six) hours as needed.        Marland Kitchen amylase-lipase-protease (LIPRAM-CR10) 33.09-15-35.5 MU per capsule Take 3 capsules by mouth 3 (three) times daily with meals.        . B Complex-C-Folic Acid (RENA-VITE RX) 1 MG TABS Take 1 tablet by mouth daily.        . calcium acetate (PHOSLO) 667 MG capsule Take 667 mg by mouth 3 (three) times daily with meals.        . cloNIDine (CATAPRES) 0.2 MG tablet Take 1 tablet (0.2 mg total) by mouth 2 (two) times daily.  60 tablet  0  . darbepoetin (ARANESP, ALB FREE, SURECLICK) 200 MCG/0.4ML SOLN Inject 200  mcg into the vein every 30 (thirty) days.        . folic acid (FOLVITE) 1 MG tablet Take 1 mg by mouth daily.        . Iron Sucrose (VENOFER IV) Inject into the vein.        Marland Kitchen ondansetron (ZOFRAN) 4 MG tablet Take 1 tablet (4 mg total) by mouth every 8 (eight) hours as needed for nausea.  30 tablet  0  . pantoprazole (PROTONIX) 40 MG tablet Take 1 tablet (40 mg total) by mouth daily.  30 tablet  1  . paricalcitol (ZEMPLAR) 5 MCG/ML injection Inject 5 mcg into the vein 3 (three) times a week.        . thiamine 100 MG tablet Take 100 mg by mouth daily.        Marland Kitchen tiotropium (SPIRIVA) 18 MCG inhalation capsule Place 18 mcg into inhaler and inhale daily.        Marland Kitchen gabapentin (NEURONTIN) 100 MG capsule Take 1 capsule (100 mg total) by mouth 3 (three) times daily.  90 capsule  2   Medications Prior to Admission  Medication Dose Route Frequency Provider Last Rate Last Dose  . alteplase (ACTIVASE) injection 2 mg  2 mg Intracatheter Once Art A  Hoss      . fentaNYL (SUBLIMAZE) injection 25-200 mcg  25-200 mcg Intravenous Q5 min PRN Art A Hoss      . midazolam (VERSED) injection 1-10 mg  1-10 mg Intravenous Q5 min PRN Art A Hoss        No results found for this or any previous visit (from the past 48 hour(s)). No results found.  Review of Systems  Constitutional: Negative for fever and chills.  Respiratory: Negative for cough.   Cardiovascular: Negative for chest pain.  Gastrointestinal: Negative for nausea, vomiting, abdominal pain and diarrhea.  Neurological: Negative for dizziness and headaches.    Blood pressure 168/99, pulse 68, resp. rate 18, SpO2 93.00%. Physical Exam  Constitutional: He is oriented to person, place, and time. He appears well-developed and well-nourished.  HENT:  Head: Normocephalic.  Eyes: EOM are normal.  Neck: Normal range of motion.  Cardiovascular: Normal rate, regular rhythm and normal heart sounds.   No murmur heard. Respiratory: Effort normal and breath sounds  normal. He has no wheezes.  GI: Soft. Bowel sounds are normal. He exhibits no mass. There is no tenderness.  Musculoskeletal: Normal range of motion.  Neurological: He is alert and oriented to person, place, and time.  Skin: Skin is warm and dry.     Assessment/Plan Pt scheduled today for Lt arm graft shuntogram and possible thrombolysis/pta/stent placement and/or  Possible dialysis catheter placement Pt aware of procedure benefit and risks and agreeable to proceed. Consent signed.  Younique Casad A 07/13/2011, 8:28 AM

## 2011-07-13 NOTE — H&P (Signed)
PCP:   Melida Quitter, MD   Chief Complaint:  Acute onset of respiratory distress  HPI: [name] is a 55 y.o. male   has a past medical history of Pancytopenia; Polysubstance abuse; End-stage renal disease on hemodialysis; Malignant hypertension; Hepatitis C; COPD (chronic obstructive pulmonary disease); Chronic recurrent pancreatitis; Smoker; Alcohol abuse; Respiratory failure (Jan 2012); Alcohol withdrawal; Pancreatitis, alcoholic; and Renal disorder.   Presented with  Trouble breathing for the past few hours. Patient received only half a piece hemodialysis on Wednesday because his access had clotted. He was supposed to get this declotted today. He did not go for his hemodialysis on Friday. Today he had IR procedure to declot his graft. After procedure he went home and started to develop rapid onset of rest respiratory distress he was struggling to breathe. No chest pain associated this. No cough no fever no chills. He did not endorse wheezing. Patient is very tremulous. He denies any recent alcohol abuse. He states that he has abdominal pain on and off back at recently has become severe and it's in the left side of his abdomen. His abdomen also feels distended. He also had some nausea and vomiting associated with eating.  The patient usually makes very little amount of urine  Review of Systems:    Pertinent Positives: Shortness of breath abdominal distention nausea vomiting, left upper quadrant abdominal pain  Pertinent Negatives: No chest pain, no neurological complaints Otherwise ROS are negative except for above, 10 systems were reviewed  Past Medical History: Past Medical History  Diagnosis Date  . Pancytopenia      chronic  . Polysubstance abuse     chronic most notable for alcohol  . End-stage renal disease on hemodialysis     HD on MWF, [facility]  . Malignant hypertension   . Hepatitis C   . COPD (chronic obstructive pulmonary disease)   . Chronic  recurrent pancreatitis     likely secondary to alcoholism  . Smoker   . Alcohol abuse   . Respiratory failure Jan 2012    Hx of VDRF   . Alcohol withdrawal   . Pancreatitis, alcoholic   . Renal disorder    Past Surgical History  Procedure Date  . Av fistula placement      Medications: Prior to Admission medications   Medication Sig Start Date End Date Taking? Authorizing Provider  albuterol (VENTOLIN HFA) 108 (90 BASE) MCG/ACT inhaler Inhale 2 puffs into the lungs every 6 (six) hours as needed. For shortness of breath.   Yes Historical Provider, MD  amylase-lipase-protease (LIPRAM-CR10) 33.09-15-35.5 MU per capsule Take 3 capsules by mouth 3 (three) times daily with meals.     Yes Historical Provider, MD  B Complex-C-Folic Acid (RENA-VITE RX) 1 MG TABS Take 1 tablet by mouth daily.     Yes Historical Provider, MD  calcium acetate (PHOSLO) 667 MG capsule Take 667 mg by mouth 3 (three) times daily with meals.     Yes Historical Provider, MD  cloNIDine (CATAPRES) 0.2 MG tablet Take 0.2 mg by mouth 2 (two) times daily.   03/13/11 03/12/12 Yes Lawrence D Klima  folic acid (FOLVITE) 1 MG tablet Take 1 mg by mouth daily.     Yes Historical Provider, MD  ondansetron (ZOFRAN) 4 MG tablet Take 4 mg by mouth every 8 (eight) hours as needed. For nausea.  02/01/11 02/01/12 Yes Michele Ho  pantoprazole (PROTONIX) 40 MG tablet Take 40 mg by mouth daily.   12/17/10 12/17/11  Yes Darnelle Maffucci  thiamine 100 MG tablet Take 100 mg by mouth daily.     Yes Historical Provider, MD  tiotropium (SPIRIVA) 18 MCG inhalation capsule Place 18 mcg into inhaler and inhale daily.     Yes Historical Provider, MD    Allergies:  No Known Allergies  Social History:  reports that he has quit smoking. His smoking use included Cigarettes. He has a 4.5 pack-year smoking history. He does not have any smokeless tobacco history on file. He reports that he drinks about 25 ounces of alcohol per week. He reports that he does not use  illicit drugs.   Family History: family history includes Alcohol abuse in an unspecified family member; Anxiety disorder in an unspecified family member; Hyperlipidemia in an unspecified family member; Hypertension in his mother; and Stroke in his mother and unspecified family member.    Physical Exam: Patient Vitals for the past 24 hrs:  BP Temp Temp src Pulse Resp SpO2  07/13/11 2119 202/119 mmHg 99.6 F (37.6 C) - 110  29  98 %  07/13/11 2045 190/106 mmHg - - 99  17  100 %  07/13/11 2030 200/115 mmHg - - 109  22  100 %  07/13/11 2000 - - - 101  25  97 %  07/13/11 1930 - - - 113  36  99 %  07/13/11 1803 189/106 mmHg 98.9 F (37.2 C) Oral 99  22  92 %   Alert and Oriented in No Acute distress, tremulous,  able to speak in full sentences oxygen saturation currently 100% on nonrebreather, somewhat disheveled Mucous Membranes and Skin: Moist mucous membranes skin normal Head Non traumatic, neck supple Heart: Rapid but regular no murmurs appreciate Lungs: Distant breath sounds bilaterally occasional crackles occasional mild wheezes, somewhat diminished air movement throughout Abdomen: Distended no CVA tenderness no rebound tenderness, no peritoneal signs Lower extremities: No clubbing cyanosis or edema Neurologically Grossly intact Skin  Dry and intact no rash body mass index is unknown because there is no height or weight on file.   Labs on Admission:   [facility] 07/13/11 2023 07/13/11 1929  NA 134* 133*  K 6.2* 6.0*  CL 109 96  CO2 -- 18*  GLUCOSE 91 89  BUN 59* 58*  CREATININE 10.10* 10.01*  CALCIUM -- 9.3  MG -- --  PHOS -- --    Basename 07/13/11 1929  AST 61*  ALT 49  ALKPHOS 82  BILITOT 0.3  PROT 7.7  ALBUMIN 3.0*    Basename 07/13/11 1929  LIPASE 39  AMYLASE --    Basename 07/13/11 2023 07/13/11 1929  WBC -- 4.3  NEUTROABS -- 3.3  HGB 13.6 12.3*  HCT 40.0 36.6*  MCV -- 95.6  PLT -- 92*    Basename 07/13/11 1929  CKTOTAL 136  CKMB 6.7*    CKMBINDEX --  TROPONINI <0.30   No results found for this basename: TSH,T4TOTAL,FREET3,T3FREE,THYROIDAB in the last 72 hours No results found for this basename: VITAMINB12:2,FOLATE:2,FERRITIN:2,TIBC:2,IRON:2,RETICCTPCT:2 in the last 72 hours No results found for this basename: HGBA1C    The CrCl is unknown because both a height and weight (above a minimum accepted value) are required for this calculation. ABG    Component Value Date/Time   PHART 7.273* 12/08/2010 2203   HCO3 20.4 12/08/2010 2203   TCO2 20 07/13/2011 2023   ACIDBASEDEF 6.0* 12/08/2010 2203   O2SAT 70.0 12/08/2010 2203     Lab Results  Component Value Date   DDIMER 2.73* 01/29/2011  Other results: ED ECG REPORT  Rate: 90  Rhythm: Sinus rhythm  ST&T Change: Peaked T waves very pronounced    Blood Culture    Component Value Date/Time   SDES BLOOD HAND RIGHT 01/08/2011 1450   SDES BLOOD HAND RIGHT 01/08/2011 1450   SPECREQUEST BOTTLES DRAWN AEROBIC AND ANAEROBIC 10CC 01/08/2011 1450   SPECREQUEST BOTTLES DRAWN AEROBIC AND ANAEROBIC 10CC 01/08/2011 1450   CULT        BLOOD CULTURE RECEIVED NO GROWTH TO DATE CULTURE WILL BE HELD FOR 5 DAYS BEFORE ISSUING A FINAL NEGATIVE REPORT 01/08/2011 1450   CULT NO GROWTH 5 DAYS 01/08/2011 1450   REPTSTATUS PENDING 01/08/2011 1450   REPTSTATUS 01/29/2011 FINAL 01/08/2011 1450       Radiological Exams on Admission: Dg Chest Port 1 View  07/13/2011  *RADIOLOGY REPORT*  Clinical Data: As of breath.  PORTABLE CHEST - 1 VIEW  Comparison: Portal chest 04/04/2011.  Findings: The heart is mildly enlarged.  Mild pulmonary vascular congestion is evident.  Minimal bibasilar atelectasis is present. There is improved aeration at the right lung base.  The upper lung fields are clear.  The visualized soft tissues and bony thorax are unremarkable.  IMPRESSION:  1.  Borderline cardiomegaly and pulmonary vascular congestion. This could represent early congestive heart failure. 2.  Minimal bibasilar  atelectasis.  Original Report Authenticated By: Jamesetta Orleans. MATTERN, M.D.   Ir Shuntogram/fistulagram Left  07/13/2011  *RADIOLOGY REPORT*  Clinical Data: Poor function of left arm straight graft  AV SHUNTOGRAM  Procedure:  The left arm was prepped and draped in a sterile fashion.  An 18 gauge Angiocath was inserted into the AV graft. Contrast was injected.  The Angiocath was removed.  Hemostasis was achieved with direct pressure. No complication.  Findings: Arterial anastomosis, graft, venous anastomosis and central venous structures are all widely patent.  Small pseudoaneurysm in the distal graft is noted.  IMPRESSION: No significant stenosis in the left arm AV graft circuit.  Original Report Authenticated By: Donavan Burnet, M.D.    Assessment/Plan  Present on Admission:  .End stage renal disease on dialysis - patient missed his dialysis times almost 2 days, he is currently in pulmonary edema and hyperkalemia with changes on EKGs and metabolic acidosis. Renal has been aware I have spoke to Dr. Adella Hare who will come and evaluate the patient is a later time he is likely to be dialyzed in the near future. Meanwhile place him in the step down.   Marland KitchenRespiratory distress, acute - likely secondary to pulmonary edema , although patient COPD slightly planar always well we will need to dialyze in the nearest future  .Hyperkalemia - will treat with amp of bicarbonate calcium gluconate, insulin and D50 and then repeat bmet .Metabolic acidosis - likely secondary to 2 renal failure, needs dialysis .Chronic recurrent pancreatitis - patient has history of alcohol abuse his abdominal pain is somewhat atypical for pancreatitis his lipase is normal we'll make him n.p.o. for now except for ice chips, morphine for pain management  .Abdominal pain, generalized - - his abdomen appears to be distended the left upper quadrant pain, we'll start with plain KUB right now and in the morning get abdominal ultrasound to  evaluate for ascites. Or splenomegaly. Of note patient has history of hepatitis  .Alcohol abuse - he's currently denies alcohol abuse but does not consistently his past records, patient is tremulous will put him on CIWA protocol, or alcohol level  COPD -  this is chronic likely  contributing to his respiratory distress we'll make sure he has albuterol and Xopenex available  Prophylaxis:Heparin subcutaneous and Protonix   CODE STATUS: Full code   Zohra Clavel 07/13/2011, 10:58 PM

## 2011-07-14 ENCOUNTER — Inpatient Hospital Stay (HOSPITAL_COMMUNITY): Payer: Medicare Other

## 2011-07-14 ENCOUNTER — Emergency Department (HOSPITAL_COMMUNITY): Payer: Medicare Other

## 2011-07-14 DIAGNOSIS — R0602 Shortness of breath: Secondary | ICD-10-CM

## 2011-07-14 DIAGNOSIS — K861 Other chronic pancreatitis: Secondary | ICD-10-CM

## 2011-07-14 LAB — CBC
HCT: 37.1 % — ABNORMAL LOW (ref 39.0–52.0)
Hemoglobin: 12.5 g/dL — ABNORMAL LOW (ref 13.0–17.0)
RDW: 19.3 % — ABNORMAL HIGH (ref 11.5–15.5)
WBC: 2.8 10*3/uL — ABNORMAL LOW (ref 4.0–10.5)

## 2011-07-14 LAB — BASIC METABOLIC PANEL WITH GFR
BUN: 47 mg/dL — ABNORMAL HIGH (ref 6–23)
CO2: 24 meq/L (ref 19–32)
Calcium: 8.7 mg/dL (ref 8.4–10.5)
Chloride: 98 meq/L (ref 96–112)
Creatinine, Ser: 8.17 mg/dL — ABNORMAL HIGH (ref 0.50–1.35)
GFR calc Af Amer: 8 mL/min — ABNORMAL LOW
GFR calc non Af Amer: 6 mL/min — ABNORMAL LOW
Glucose, Bld: 45 mg/dL — ABNORMAL LOW (ref 70–99)
Potassium: 4.1 meq/L (ref 3.5–5.1)
Sodium: 139 meq/L (ref 135–145)

## 2011-07-14 LAB — COMPREHENSIVE METABOLIC PANEL
BUN: 27 mg/dL — ABNORMAL HIGH (ref 6–23)
Calcium: 9 mg/dL (ref 8.4–10.5)
Creatinine, Ser: 5.86 mg/dL — ABNORMAL HIGH (ref 0.50–1.35)
GFR calc Af Amer: 11 mL/min — ABNORMAL LOW (ref >90)
Glucose, Bld: 70 mg/dL (ref 70–99)
Total Protein: 7.9 g/dL (ref 6.0–8.3)

## 2011-07-14 LAB — CARDIAC PANEL(CRET KIN+CKTOT+MB+TROPI)
CK, MB: 7.8 ng/mL (ref 0.3–4.0)
CK, MB: 7.1 ng/mL (ref 0.3–4.0)
Troponin I: <0.3 ng/mL (ref <0.30)

## 2011-07-14 LAB — GLUCOSE, CAPILLARY: Glucose-Capillary: 71 mg/dL (ref 70–99)

## 2011-07-14 MED ORDER — DIAZEPAM 5 MG PO TABS
10.0000 mg | ORAL_TABLET | Freq: Three times a day (TID) | ORAL | Status: DC
  Administered 2011-07-14 – 2011-07-16 (×7): 10 mg via ORAL
  Filled 2011-07-14 (×2): qty 2
  Filled 2011-07-14: qty 1
  Filled 2011-07-14 (×3): qty 2
  Filled 2011-07-14 (×3): qty 1

## 2011-07-14 MED ORDER — CALCIUM ACETATE 667 MG PO CAPS
667.0000 mg | ORAL_CAPSULE | Freq: Three times a day (TID) | ORAL | Status: DC
  Administered 2011-07-15 – 2011-07-16 (×2): 667 mg via ORAL
  Filled 2011-07-14 (×10): qty 1

## 2011-07-14 MED ORDER — NICOTINE 14 MG/24HR TD PT24
14.0000 mg | MEDICATED_PATCH | Freq: Every day | TRANSDERMAL | Status: DC
  Administered 2011-07-15 – 2011-07-16 (×3): 14 mg via TRANSDERMAL
  Filled 2011-07-14 (×3): qty 1

## 2011-07-14 MED ORDER — ONDANSETRON HCL 4 MG PO TABS: 4.0000 mg | ORAL_TABLET | Freq: Three times a day (TID) | ORAL | Status: DC | PRN

## 2011-07-14 MED ORDER — HEPARIN SODIUM (PORCINE) 5000 UNIT/ML IJ SOLN
5000.0000 [IU] | Freq: Three times a day (TID) | INTRAMUSCULAR | Status: DC
Start: 2011-07-14 — End: 2011-07-14
  Filled 2011-07-14 (×4): qty 1

## 2011-07-14 MED ORDER — HEPARIN SODIUM (PORCINE) 1000 UNIT/ML DIALYSIS
20.0000 [IU]/kg | INTRAMUSCULAR | Status: DC | PRN
  Filled 2011-07-14: qty 2

## 2011-07-14 MED ORDER — IPRATROPIUM BROMIDE 0.02 % IN SOLN
0.5000 mg | Freq: Four times a day (QID) | RESPIRATORY_TRACT | Status: DC
  Administered 2011-07-14 – 2011-07-16 (×6): 0.5 mg via RESPIRATORY_TRACT
  Filled 2011-07-14 (×7): qty 2.5

## 2011-07-14 MED ORDER — PANCRELIPASE (LIP-PROT-AMYL) 12000-38000 UNITS PO CPEP
3.0000 | ORAL_CAPSULE | Freq: Three times a day (TID) | ORAL | Status: DC
  Administered 2011-07-14: 3 via ORAL
  Filled 2011-07-14 (×4): qty 3

## 2011-07-14 MED ORDER — GUAIFENESIN-DM 100-10 MG/5ML PO SYRP
5.0000 mL | ORAL_SOLUTION | ORAL | Status: DC | PRN
  Filled 2011-07-14: qty 5

## 2011-07-14 MED ORDER — CLONIDINE HCL 0.2 MG PO TABS
0.2000 mg | ORAL_TABLET | Freq: Two times a day (BID) | ORAL | Status: DC
  Administered 2011-07-14 – 2011-07-16 (×5): 0.2 mg via ORAL
  Filled 2011-07-14 (×7): qty 1

## 2011-07-14 MED ORDER — THERA M PLUS PO TABS
1.0000 | ORAL_TABLET | Freq: Every day | ORAL | Status: DC
  Filled 2011-07-14: qty 1

## 2011-07-14 MED ORDER — FOLIC ACID 1 MG PO TABS
1.0000 mg | ORAL_TABLET | Freq: Every day | ORAL | Status: DC
  Administered 2011-07-15 – 2011-07-16 (×2): 1 mg via ORAL
  Filled 2011-07-14 (×2): qty 1

## 2011-07-14 MED ORDER — VITAMIN B-1 100 MG PO TABS
100.0000 mg | ORAL_TABLET | Freq: Every day | ORAL | Status: DC
  Filled 2011-07-14: qty 1

## 2011-07-14 MED ORDER — VITAMIN B-1 100 MG PO TABS
100.0000 mg | ORAL_TABLET | Freq: Every day | ORAL | Status: DC
  Administered 2011-07-15 – 2011-07-16 (×2): 100 mg via ORAL
  Filled 2011-07-14 (×2): qty 1

## 2011-07-14 MED ORDER — LORAZEPAM 2 MG/ML IJ SOLN
1.0000 mg | Freq: Four times a day (QID) | INTRAMUSCULAR | Status: DC | PRN
  Administered 2011-07-14: 1 mg via INTRAVENOUS

## 2011-07-14 MED ORDER — GUAIFENESIN ER 600 MG PO TB12
600.0000 mg | ORAL_TABLET | Freq: Two times a day (BID) | ORAL | Status: DC
  Filled 2011-07-14 (×2): qty 1

## 2011-07-14 MED ORDER — SODIUM CHLORIDE 0.9 % IJ SOLN: 3.0000 mL | INTRAMUSCULAR | Status: DC | PRN

## 2011-07-14 MED ORDER — ACETAMINOPHEN 325 MG PO TABS
650.0000 mg | ORAL_TABLET | Freq: Four times a day (QID) | ORAL | Status: DC | PRN
  Administered 2011-07-15 (×2): 650 mg via ORAL
  Filled 2011-07-14: qty 2

## 2011-07-14 MED ORDER — ONDANSETRON HCL 4 MG PO TABS: 4.0000 mg | ORAL_TABLET | Freq: Four times a day (QID) | ORAL | Status: DC | PRN

## 2011-07-14 MED ORDER — LORAZEPAM 1 MG PO TABS: 1.0000 mg | ORAL_TABLET | Freq: Four times a day (QID) | ORAL | Status: DC | PRN

## 2011-07-14 MED ORDER — SODIUM CHLORIDE 0.9 % IV SOLN: 250.0000 mL | INTRAVENOUS | Status: DC | PRN

## 2011-07-14 MED ORDER — ACETAMINOPHEN 650 MG RE SUPP: 650.0000 mg | Freq: Four times a day (QID) | RECTAL | Status: DC | PRN

## 2011-07-14 MED ORDER — SODIUM CHLORIDE 0.9 % IJ SOLN
3.0000 mL | Freq: Two times a day (BID) | INTRAMUSCULAR | Status: DC
  Administered 2011-07-14 – 2011-07-16 (×5): 3 mL via INTRAVENOUS

## 2011-07-14 MED ORDER — MORPHINE SULFATE 2 MG/ML IJ SOLN
INTRAMUSCULAR | Status: AC
  Filled 2011-07-14: qty 1

## 2011-07-14 MED ORDER — LORAZEPAM 2 MG/ML IJ SOLN
1.0000 mg | INTRAMUSCULAR | Status: DC | PRN
  Administered 2011-07-15 – 2011-07-16 (×2): 1 mg via INTRAVENOUS
  Filled 2011-07-14 (×4): qty 1

## 2011-07-14 MED ORDER — CHLORHEXIDINE GLUCONATE CLOTH 2 % EX PADS
6.0000 | MEDICATED_PAD | Freq: Every day | CUTANEOUS | Status: DC
  Administered 2011-07-14 – 2011-07-15 (×2): 6 via TOPICAL

## 2011-07-14 MED ORDER — PANTOPRAZOLE SODIUM 40 MG IV SOLR
40.0000 mg | INTRAVENOUS | Status: DC
  Administered 2011-07-14 – 2011-07-15 (×2): 40 mg via INTRAVENOUS
  Filled 2011-07-14 (×3): qty 40

## 2011-07-14 MED ORDER — PANTOPRAZOLE SODIUM 40 MG IV SOLR: 40.0000 mg | INTRAVENOUS | Status: DC

## 2011-07-14 MED ORDER — MUPIROCIN 2 % EX OINT
1.0000 "application " | TOPICAL_OINTMENT | Freq: Two times a day (BID) | CUTANEOUS | Status: DC
  Administered 2011-07-14 – 2011-07-16 (×5): 1 via NASAL
  Filled 2011-07-14 (×3): qty 22

## 2011-07-14 MED ORDER — LEVALBUTEROL HCL 0.63 MG/3ML IN NEBU
0.6300 mg | INHALATION_SOLUTION | Freq: Four times a day (QID) | RESPIRATORY_TRACT | Status: DC | PRN
  Filled 2011-07-14: qty 3

## 2011-07-14 MED ORDER — FOLIC ACID 1 MG PO TABS
1.0000 mg | ORAL_TABLET | Freq: Every day | ORAL | Status: DC
  Filled 2011-07-14: qty 1

## 2011-07-14 MED ORDER — THIAMINE HCL 100 MG/ML IJ SOLN
100.0000 mg | Freq: Every day | INTRAMUSCULAR | Status: DC
  Filled 2011-07-14 (×2): qty 1

## 2011-07-14 MED ORDER — HEPARIN SODIUM (PORCINE) 1000 UNIT/ML DIALYSIS
100.0000 [IU]/kg | INTRAMUSCULAR | Status: DC | PRN
  Administered 2011-07-15: 7000 [IU] via INTRAVENOUS_CENTRAL
  Filled 2011-07-14: qty 7

## 2011-07-14 MED ORDER — LORAZEPAM 2 MG/ML IJ SOLN
1.0000 mg | Freq: Four times a day (QID) | INTRAMUSCULAR | Status: DC | PRN
  Administered 2011-07-14: 1 mg via INTRAVENOUS
  Filled 2011-07-14 (×2): qty 1

## 2011-07-14 MED ORDER — ONDANSETRON HCL 4 MG/2ML IJ SOLN: 4.0000 mg | Freq: Four times a day (QID) | INTRAMUSCULAR | Status: DC | PRN

## 2011-07-14 MED ORDER — PANTOPRAZOLE SODIUM 40 MG PO TBEC
40.0000 mg | DELAYED_RELEASE_TABLET | Freq: Every day | ORAL | Status: DC
  Administered 2011-07-14: 40 mg via ORAL
  Filled 2011-07-14: qty 1

## 2011-07-14 NOTE — Progress Notes (Signed)
Subjective: Transferred from hospitalist service, pt reports improvement in sob since hemodialysis today, c/o of "pancreas pain", reports last alcohol "months ago", continues to smoke ~1/2ppd  Objective: Vital signs in last 24 hours: Filed Vitals:   07/14/11 1400 07/14/11 1409 07/14/11 1500 07/14/11 1600  BP: 155/92  148/96   Pulse: 77  80   Temp:    98.1 F (36.7 C)  TempSrc:    Oral  Resp: 14  19   Height:      Weight:      SpO2: 92% 97% 93%      Intake/Output Summary (Last 24 hours) at 07/14/11 1743 Last data filed at 07/14/11 1400  Gross per 24 hour  Intake    710 ml  Output   4256 ml  Net  -3546 ml   Physical Exam: General: Vital signs reviewed and noted. Well-developed, well-nourished, in no acute distress but tremulous on exam; alert, appropriate and cooperative throughout examination. Head: Normocephalic, atraumatic. Eyes: PERRL, EOMI, No signs of anemia or jaundice. Neck: No deformities, masses, or tenderness noted.Supple, No carotid Bruits, no JVD. Lungs: Normal respiratory effort. Clear to auscultation BL anteriorly without crackles or wheezes. Heart: RRR. S1 and S2 normal without gallop, murmur, or rubs. Abdomen: BS hyperactive, taut, tender to palpation epigastric and LUQ Extremities: No pretibial edema. Bandage to right groin, clean, dry and intact Neurologic: non-focal, A&Ox3, tremulous    Lab Results: Basic Metabolic Panel:  Lab 07/14/11 4540 07/14/11 0100  NA 135 139  K 5.0 4.1  CL 96 98  CO2 23 24  GLUCOSE 70 45*  BUN 27* 47*  CREATININE 5.86* 8.17*  CALCIUM 9.0 8.7  MG -- --  PHOS -- --   Liver Function Tests:  Lab 07/14/11 0919 07/13/11 1929  AST 71* 61*  ALT 53 49  ALKPHOS 86 82  BILITOT 0.6 0.3  PROT 7.9 7.7  ALBUMIN 3.0* 3.0*    Lab 07/13/11 1929  LIPASE 39  AMYLASE --   CBC:  Lab 07/14/11 0919 07/13/11 2023 07/13/11 1929  WBC 2.8* -- 4.3  NEUTROABS -- -- 3.3  HGB 12.5* 13.6 --  HCT 37.1* 40.0 --  MCV 95.1 -- 95.6    PLT 83* -- 92*   Cardiac Enzymes:  Lab 07/14/11 1620 07/14/11 0919 07/13/11 1929  CKTOTAL 186 161 136  CKMB 7.1* 7.8* 6.7*  CKMBINDEX -- -- --  TROPONINI <0.30 <0.30 <0.30   CBG:  Lab 07/14/11 0651  GLUCAP 71   Thyroid Function Tests:  Lab 07/14/11 0919  TSH 0.744  T4TOTAL --  FREET4 --  T3FREE --  THYROIDAB --   Coagulation:  Lab 07/13/11 1929  LABPROT 12.8  INR 0.94    Urine Drug Screen: Drugs of Abuse     Component Value Date/Time   LABOPIA NEG 09/27/2010 1551   COCAINSCRNUR NEG 09/27/2010 1551   COCAINSCRNUR NONE DETECTED 09/03/2010 1852   LABBENZ NEG 09/27/2010 1551   LABBENZ POSITIVE* 09/03/2010 1852   AMPHETMU NEG 09/27/2010 1551   AMPHETMU NONE DETECTED 09/03/2010 1852   THCU NONE DETECTED 09/03/2010 1852   LABBARB  Value: NONE DETECTED        DRUG SCREEN FOR MEDICAL PURPOSES ONLY.  IF CONFIRMATION IS NEEDED FOR ANY PURPOSE, NOTIFY LAB WITHIN 5 DAYS.        LOWEST DETECTABLE LIMITS FOR URINE DRUG SCREEN Drug Class       Cutoff (ng/mL) Amphetamine      1000 Barbiturate      200 Benzodiazepine  200 Tricyclics       300 Opiates          300 Cocaine          300 THC              50 09/03/2010 1852    Alcohol Level:  Lab 07/14/11 0919  ETH <11    Micro Results: Recent Results (from the past 240 hour(s))  MRSA PCR SCREENING     Status: Abnormal   Collection Time   07/14/11  6:41 AM      Component Value Range Status Comment   MRSA by PCR POSITIVE (*) NEGATIVE  Final    Studies/Results: Dg Abd 1 View  07/13/2011  *RADIOLOGY REPORT*  Clinical Data: Abdominal pain and distension.  Shortness of breath and nausea.  ABDOMEN - 1 VIEW  Comparison: 09/27/2010  Findings: Supine views of the abdomen demonstrate gas and stool throughout the colon without small or large bowel distension.  No obvious wall thickening.  No radiopaque stones.  Calcified phleboliths in the pelvis.  Degenerative changes in the lumbar spine.  IMPRESSION: Stool filled colon.  Nonobstructive bowel  gas pattern.  Original Report Authenticated By: Marlon Pel, M.D.   US Abdomen Complete  07/14/2011  *RADIOLOGY REPORT*  Clinical Data:  Hepatitis C, abdominal pain, nausea/vomiting, pancreatitis  COMPLETE ABDOMINAL ULTRASOUND  Comparison:  CT abdomen pelvis dated 04/03/2011  Findings:  Gallbladder:  No gallstones, gallbladder wall thickening, or pericholecystic fluid.  Negative sonographic Murphy's sign.  Common bile duct:  Measures 4 mm.  Liver:  Coarse, hyperechoic hepatic parenchyma, suggesting hepatic steatosis.  No focal lesion identified.  IVC:  Appears normal.  Pancreas:  Visualized portions are grossly unremarkable.  Spleen:  Measures 10.7 cm.  Right Kidney:  Cortical atrophy with echogenic renal parenchyma, compatible with end-stage renal disease.  Measures 8.4 cm.  No hydronephrosis.  Left Kidney:   Cortical atrophy with echogenic renal parenchyma, compatible with end-stage renal disease.  Measures 8.8 cm.  No hydronephrosis.  Abdominal aorta:  No aneurysm identified.  Additional comments: Small right pleural effusion.  IMPRESSION: Suspected hepatic steatosis.  Bilateral renal atrophy with echogenic renal parenchyma, compatible with end-stage renal disease.  Original Report Authenticated By: Charline Bills, M.D.   Dg Chest Port 1 View  07/13/2011  *RADIOLOGY REPORT*  Clinical Data: As of breath.  PORTABLE CHEST - 1 VIEW  Comparison: Portal chest 04/04/2011.  Findings: The heart is mildly enlarged.  Mild pulmonary vascular congestion is evident.  Minimal bibasilar atelectasis is present. There is improved aeration at the right lung base.  The upper lung fields are clear.  The visualized soft tissues and bony thorax are unremarkable.  IMPRESSION:  1.  Borderline cardiomegaly and pulmonary vascular congestion. This could represent early congestive heart failure. 2.  Minimal bibasilar atelectasis.  Original Report Authenticated By: Jamesetta Orleans. MATTERN, M.D.   Ir Shuntogram/fistulagram  Left  07/13/2011  *RADIOLOGY REPORT*  Clinical Data: Poor function of left arm straight graft  AV SHUNTOGRAM  Procedure:  The left arm was prepped and draped in a sterile fashion.  An 18 gauge Angiocath was inserted into the AV graft. Contrast was injected.  The Angiocath was removed.  Hemostasis was achieved with direct pressure. No complication.  Findings: Arterial anastomosis, graft, venous anastomosis and central venous structures are all widely patent.  Small pseudoaneurysm in the distal graft is noted.  IMPRESSION: No significant stenosis in the left arm AV graft circuit.  Original Report Authenticated By: Merton Border  Joseph Art, M.D.   Medications:  Scheduled Meds:   . calcium acetate  667 mg Oral TID WC  . calcium gluconate  1 g Intravenous To Major  . Chlorhexidine Gluconate Cloth  6 each Topical Q0600  . cloNIDine  0.1 mg Oral Once  . cloNIDine  0.2 mg Oral BID  . insulin aspart  10 Units Intravenous Once   Followed by  . dextrose  25 g Intravenous Once  . diazepam  10 mg Oral TID  . folic acid  1 mg Oral Daily  .  HYDROmorphone (DILAUDID) injection  1 mg Intravenous Once  . ipratropium  0.5 mg Nebulization Q6H  . mupirocin ointment  1 application Nasal BID  . ondansetron  4 mg Intravenous Once  . pantoprazole (PROTONIX) IV  40 mg Intravenous Q24H  . sodium bicarbonate  50 mEq Intravenous Once  . sodium chloride  3 mL Intravenous Q12H  . thiamine  100 mg Oral Daily   Or  . thiamine  100 mg Intravenous Daily  . DISCONTD: folic acid  1 mg Oral Daily  . DISCONTD: guaiFENesin  600 mg Oral BID  . DISCONTD: heparin  5,000 Units Subcutaneous Q8H  . DISCONTD: lipase/protease/amylase  3 capsule Oral TID WC  . DISCONTD: multivitamins ther. w/minerals  1 tablet Oral Daily  . DISCONTD: pantoprazole  40 mg Oral Daily  . DISCONTD: sodium bicarbonate  50 mEq Intravenous Once  . DISCONTD: thiamine  100 mg Oral Daily   Continuous Infusions:  PRN Meds:.sodium chloride, sodium chloride, sodium  chloride, acetaminophen, acetaminophen, alteplase, feeding supplement (NEPRO CARB STEADY), heparin, heparin, heparin, levalbuterol, lidocaine, lidocaine-prilocaine, LORazepam, LORazepam, LORazepam, LORazepam, morphine injection, ondansetron (ZOFRAN) IV, ondansetron, pentafluoroprop-tetrafluoroeth, sodium chloride, DISCONTD: guaiFENesin-dextromethorphan, DISCONTD: heparin, DISCONTD: ondansetron  Assessment/Plan: 55 yo with hx significant for ESRD on HD and alcoholic pancreatitis, admitted with acute pulmonary edema and respiratory distress   1. Respiratory distress, acute: secondary to pulmonary edema from missed hemodialysis treatments x2, currently hemodynamically stable, COPD stable Plan -continue monitoring in Step-down for now -continue Albuterol and Xopenex prn  2. ESRD on HD:  5kg fluid removed via HD on admission, metabolic acidosis resolving with anion gap = 16 (5 on admission) -will continue HD per Renal, appreciate recommendations -renal function panel and CBC tomorrow morning   3. Abd Pain: pt with h/o peptic ulcer disease and chronic pancreatitis, lipase is normal, KUB with stool filled colon and nonobstructive gas pattern, Abd U/S with suspected steatosis, bilateral renal atrophy Plan -continue morphine prn for pain -continue protonix  -will consider constipation therapy once patient sitting upright  4. Hypertension: bp increasing to 170/100's Plan -continue Clonidine 0.1 mg bid  5. ETOH abuse/Tobacco Abuse: reports sobriety with negative alcohol level although patient with impressive generalized tremor, smokes 1/2ppd Plan -will start Valium 10mg  IV tid -will continue CIWA protocol with Ativan -will continue nicotine patch  6. DVT ppx: will d/c Heparin given prior oozing of right femoral HD site Plan -continue SCDs    LOS: 1 day   [name] 07/14/2011, 5:43 PM

## 2011-07-14 NOTE — Consult Note (Signed)
[facility]      Consult Note  Patient name: [name] MRN: [id number] DOB: 11/27/1955 Sex: male  Consulting Physician:  Renal  Reason for Consult:  Chief Complaint  Patient presents with  . Pancreatitis  . Shortness of Breath    HISTORY OF PRESENT ILLNESS: The patient was admitted for shortness of breath.  He has had problems with pulling clots at dialysis.  He required a femoral catheter yesterday to dialyse.  This was removed this morning due to bleeding.  A recent graft study by radiology does not show any stenoses within the graft.  I am asked to provide assistance.  Past Medical History  Diagnosis Date  . Pancytopenia      chronic  . Polysubstance abuse     chronic most notable for alcohol  . End-stage renal disease on hemodialysis     HD on MWF, [facility]  . Malignant hypertension   . Hepatitis C   . COPD (chronic obstructive pulmonary disease)   . Chronic recurrent pancreatitis     likely secondary to alcoholism  . Smoker   . Alcohol abuse   . Respiratory failure Jan 2012    Hx of VDRF   . Alcohol withdrawal   . Pancreatitis, alcoholic   . Renal disorder     Past Surgical History  Procedure Date  . Av fistula placement     History   Social History  . Marital Status: Divorced    Spouse Name: N/A    Number of Children: N/A  . Years of Education: N/A   Occupational History  . Not on file.   Social History Main Topics  . Smoking status: Former Smoker -- 0.3 packs/day for 15 years    Types: Cigarettes  . Smokeless tobacco: Not on file  . Alcohol Use: 25.0 oz/week    50 drink(s) per week     02/28/11: currently not drinking, 04/04/11: drinking a fifth (25 ounces/75ccs) of vodka  3 days/week, 07/13/11 now says have not had any alcohol for 1 year but is tremolous.  . Drug Use: No  . Sexually Active: Not on file         Other Topics Concern  . Not on file   Social History Narrative    unemployed  divorced , used to drink one bottle of whiskey for years. States he is currently drinking 1 5th of the cheapest vodka they have.As of 02/28/2011:Lives [address] House, sister-in-law's house, lives there alone    Family History  Problem Relation Age of Onset  . Hypertension Mother   . Stroke Mother   . Alcohol abuse    . Anxiety disorder    . Hyperlipidemia    . Stroke      Allergies as of 07/13/2011  . (No Known Allergies)    Current Facility-Administered Medications on File Prior to Encounter  Medication Dose Route Frequency Provider Last Rate Last Dose  . morphine 2 MG/ML injection           . DISCONTD: alteplase (ACTIVASE) injection 2 mg  2 mg Intracatheter Once Art A Hoss      . DISCONTD: fentaNYL (SUBLIMAZE) injection 25-200 mcg  25-200 mcg Intravenous Q5 min PRN Art A Hoss      . DISCONTD: iohexol (OMNIPAQUE) 300 MG/ML solution 32 mL  32 mL Intravenous Once PRN Art A Hoss      . DISCONTD: midazolam (VERSED) injection 1-10 mg  1-10   mg Intravenous Q5 min PRN Art A Hoss       Current Outpatient Prescriptions on File Prior to Encounter  Medication Sig Dispense Refill  . albuterol (VENTOLIN HFA) 108 (90 BASE) MCG/ACT inhaler Inhale 2 puffs into the lungs every 6 (six) hours as needed. For shortness of breath.      . amylase-lipase-protease (LIPRAM-CR10) 33.09-15-35.5 MU per capsule Take 3 capsules by mouth 3 (three) times daily with meals.        . B Complex-C-Folic Acid (RENA-VITE RX) 1 MG TABS Take 1 tablet by mouth daily.        . calcium acetate (PHOSLO) 667 MG capsule Take 667 mg by mouth 3 (three) times daily with meals.        . folic acid (FOLVITE) 1 MG tablet Take 1 mg by mouth daily.        . thiamine 100 MG tablet Take 100 mg by mouth daily.        . tiotropium (SPIRIVA) 18 MCG inhalation capsule Place 18 mcg into inhaler and inhale daily.        . DISCONTD: pantoprazole (PROTONIX) 40 MG tablet Take 1 tablet (40 mg total) by mouth daily.  30 tablet  1     REVIEW  OF SYSTEMS: See H&P from 12/8.  No changes  PHYSICAL EXAMINATION: General: The patient appears their stated age.  Vital signs are BP 149/87  Pulse 75  Temp(Src) 98.7 F (37.1 C) (Oral)  Resp 12  Ht 6' (1.829 m)  Wt 166 lb 10.7 oz (75.6 kg)  BMI 22.60 kg/m2  SpO2 94% Pulmonary: Respirations are non-labored HEENT:  No gross abnormalities Abdomen: mild tenderness Musculoskeletal: There are no major deformities.   Neurologic: No focal weakness or paresthesias are detected, Skin: There are no ulcer or rashes noted. Psychiatric: The patient has normal affect. Cardiovascular: thrill over access site in L UE .  Diagnostic Studies: I have reviewed his recent IR study.  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  The only explanation I see for difficulty is that the graft is worn out.  It that is the case, the patient will need new access along with a catheter.     V. Wells Brabham IV, M.D. [facility] [place] Office: [phone] Pager:  [phone] 

## 2011-07-14 NOTE — Progress Notes (Addendum)
Subjective:  Alert, feels much better after HD. We were pulling clots from the AVG, as was happening at the outpatient center.  I spoke with IR -- they did not do declot yesterday, but did do a shuntogram which showed no occlusion or stenosis, "wide open" according to Dr. Bonnielee Haff.  Fem cath placed last night and  5 kg removed, no further SOB and BiPap d/c'd.  Slight cough.  Fem cath is bleeding significantly, will remove today.  Objective:    Vital signs in last 24 hours: Filed Vitals:   07/14/11 0430 07/14/11 0500 07/14/11 0519 07/14/11 0600  BP: 164/107 214/119 202/118   Pulse: 73 93 79   Temp:   98.2 F (36.8 C)   TempSrc:   Oral   Resp: 13 14 16    Height:    6' (1.829 m)  Weight:    70.2 kg (154 lb 12.2 oz)  SpO2: 94% 99% 97%    Weight change:   Intake/Output Summary (Last 24 hours) at 07/14/11 0805 Last data filed at 07/14/11 0519  Gross per 24 hour  Intake      0 ml  Output   4006 ml  Net  -4006 ml   Labs: Basic Metabolic Panel:  Lab 07/14/11 1610 2011-08-05 2023 08/05/2011 1929 2011/08/05 0814  NA 139 134* 133* --  K 4.1 6.2* 6.0* 5.5*  CL 98 109 96 --  CO2 24 -- 18* --  GLUCOSE 45* 91 89 --  BUN 47* 59* 58* --  CREATININE 8.17* 10.10* 10.01* --  ALB -- -- -- --  CALCIUM 8.7 -- 9.3 --  PHOS -- -- -- --   Liver Function Tests:  Lab 08/05/2011 1929  AST 61*  ALT 49  ALKPHOS 82  BILITOT 0.3  PROT 7.7  ALBUMIN 3.0*    Lab 05-Aug-2011 1929  LIPASE 39  AMYLASE --   No results found for this basename: AMMONIA:3 in the last 168 hours CBC:  Lab 08/05/11 2023 Aug 05, 2011 1929  WBC -- 4.3  NEUTROABS -- 3.3  HGB 13.6 12.3*  HCT 40.0 36.6*  MCV -- 95.6  PLT -- 92*   Cardiac Enzymes:  Lab 08-05-11 1929  CKTOTAL 136  CKMB 6.7*  CKMBINDEX --  TROPONINI <0.30   CBG:  Lab 07/14/11 0651  GLUCAP 71    Iron Studies: No results found for this basename: IRON:30,TIBC:30,SATURATION RATIOS:30,TRANSFERRIN:30,FERRITIN:30 in the last 168 hours Studies/Results: Dg Abd 1  View  08-05-11  *RADIOLOGY REPORT*  Clinical Data: Abdominal pain and distension.  Shortness of breath and nausea.  ABDOMEN - 1 VIEW  Comparison: 09/27/2010  Findings: Supine views of the abdomen demonstrate gas and stool throughout the colon without small or large bowel distension.  No obvious wall thickening.  No radiopaque stones.  Calcified phleboliths in the pelvis.  Degenerative changes in the lumbar spine.  IMPRESSION: Stool filled colon.  Nonobstructive bowel gas pattern.  Original Report Authenticated By: Marlon Pel, M.D.   Dg Chest Port 1 View  08-05-2011  *RADIOLOGY REPORT*  Clinical Data: As of breath.  PORTABLE CHEST - 1 VIEW  Comparison: Portal chest 04/04/2011.  Findings: The heart is mildly enlarged.  Mild pulmonary vascular congestion is evident.  Minimal bibasilar atelectasis is present. There is improved aeration at the right lung base.  The upper lung fields are clear.  The visualized soft tissues and bony thorax are unremarkable.  IMPRESSION:  1.  Borderline cardiomegaly and pulmonary vascular congestion. This could represent early congestive heart failure.  2.  Minimal bibasilar atelectasis.  Original Report Authenticated By: Jamesetta Orleans. MATTERN, M.D.   Ir Shuntogram/fistulagram Left  07/13/2011  *RADIOLOGY REPORT*  Clinical Data: Poor function of left arm straight graft  AV SHUNTOGRAM  Procedure:  The left arm was prepped and draped in a sterile fashion.  An 18 gauge Angiocath was inserted into the AV graft. Contrast was injected.  The Angiocath was removed.  Hemostasis was achieved with direct pressure. No complication.  Findings: Arterial anastomosis, graft, venous anastomosis and central venous structures are all widely patent.  Small pseudoaneurysm in the distal graft is noted.  IMPRESSION: No significant stenosis in the left arm AV graft circuit.  Original Report Authenticated By: Donavan Burnet, M.D.   Medications:      . calcium acetate  667 mg Oral TID WC  .  calcium gluconate  1 g Intravenous To Major  . cloNIDine  0.1 mg Oral Once  . cloNIDine  0.2 mg Oral BID  . insulin aspart  10 Units Intravenous Once   Followed by  . dextrose  25 g Intravenous Once  . folic acid  1 mg Oral Daily  . guaiFENesin  600 mg Oral BID  . heparin  5,000 Units Subcutaneous Q8H  .  HYDROmorphone (DILAUDID) injection  1 mg Intravenous Once  . ipratropium  0.5 mg Nebulization Q6H  . lipase/protease/amylase  3 capsule Oral TID WC  . multivitamins ther. w/minerals  1 tablet Oral Daily  . ondansetron  4 mg Intravenous Once  . pantoprazole  40 mg Oral Daily  . sodium bicarbonate  50 mEq Intravenous Once  . sodium chloride  3 mL Intravenous Q12H  . thiamine  100 mg Oral Daily  . DISCONTD: sodium bicarbonate  50 mEq Intravenous Once    I  have reviewed scheduled and prn medications.  Physical Exam: General: alert nad, breathing better  Heart: regular, no rub Lungs: cta bilat Abdomen: soft Extremities: oozing BRB at HD cath site Neuro: alert and 0x3  Problem/Plan: 1. ESRD- access issues left arm AVG.  Prob infiltrated or bleeding into pseudoaneurysms which are then being stuck.  Will ask [facility] for their input.  Remove fem HD cath secondary to bleeding. Usual HD is MWF.   2. SOB due to pulm edema and COPD.  Better. 3. Abd pain, chronic pancreatitis - per primary 4. HTN/volume- prob still has extra volume.  BP high, go ahead with po meds.     Rockwell Zentz D  07/14/2011,8:05 AM  LOS: 1 day  Cell #  [phone]

## 2011-07-15 ENCOUNTER — Inpatient Hospital Stay (HOSPITAL_COMMUNITY): Payer: Medicare Other

## 2011-07-15 ENCOUNTER — Ambulatory Visit (HOSPITAL_COMMUNITY): Payer: Medicare Other

## 2011-07-15 ENCOUNTER — Other Ambulatory Visit (HOSPITAL_COMMUNITY): Payer: Medicare Other

## 2011-07-15 DIAGNOSIS — K861 Other chronic pancreatitis: Secondary | ICD-10-CM

## 2011-07-15 DIAGNOSIS — J449 Chronic obstructive pulmonary disease, unspecified: Secondary | ICD-10-CM

## 2011-07-15 DIAGNOSIS — N186 End stage renal disease: Secondary | ICD-10-CM

## 2011-07-15 DIAGNOSIS — E875 Hyperkalemia: Secondary | ICD-10-CM

## 2011-07-15 LAB — CBC
HCT: 38.1 % — ABNORMAL LOW (ref 39.0–52.0)
Hemoglobin: 12.7 g/dL — ABNORMAL LOW (ref 13.0–17.0)
MCH: 32 pg (ref 26.0–34.0)
RBC: 3.97 MIL/uL — ABNORMAL LOW (ref 4.22–5.81)

## 2011-07-15 LAB — RENAL FUNCTION PANEL
Calcium: 9.5 mg/dL (ref 8.4–10.5)
GFR calc Af Amer: 8 mL/min — ABNORMAL LOW (ref >90)
GFR calc non Af Amer: 7 mL/min — ABNORMAL LOW (ref >90)
Glucose, Bld: 75 mg/dL (ref 70–99)
Phosphorus: 6 mg/dL — ABNORMAL HIGH (ref 2.3–4.6)
Sodium: 134 mEq/L — ABNORMAL LOW (ref 135–145)

## 2011-07-15 LAB — MAGNESIUM: Magnesium: 2 mg/dL (ref 1.5–2.5)

## 2011-07-15 MED ORDER — DIAZEPAM 5 MG PO TABS
ORAL_TABLET | ORAL | Status: AC
  Administered 2011-07-15: 10 mg via ORAL
  Filled 2011-07-15: qty 2

## 2011-07-15 MED ORDER — ACETAMINOPHEN 325 MG PO TABS
ORAL_TABLET | ORAL | Status: AC
  Administered 2011-07-15: 650 mg via ORAL
  Filled 2011-07-15: qty 2

## 2011-07-15 MED ORDER — LORAZEPAM 2 MG/ML IJ SOLN
2.0000 mg | Freq: Once | INTRAMUSCULAR | Status: AC
  Administered 2011-07-15: 2 mg via INTRAVENOUS

## 2011-07-15 MED ORDER — MORPHINE SULFATE 2 MG/ML IJ SOLN
INTRAMUSCULAR | Status: AC
  Filled 2011-07-15: qty 1

## 2011-07-15 NOTE — Progress Notes (Signed)
Internal Medicine Teaching Service Attending Note Date: 07/15/2011  Patient name: [name]  Medical record number: [id number]  Date of birth: Mar 26, 1956    This patient has been seen and discussed with the house staff. Please see their note for complete details. Also, please see H&P from hospitalist service for initial plan.  He is less SOB and BP is improved with dialysis.  He also tells me that his tremors are chronic and at his baseline.  He states he has had them since childhood, therefore I do not see any evidence for DTs and his elevated BP is from fluid overload, now resolving with dialysis.  He will be transferred out of step down.   [name] 07/15/2011, 1:06 PM

## 2011-07-15 NOTE — Progress Notes (Signed)
Subjective: Pt feeling well today.  Reports continued abdominal pain that remains unchanged but states he is hungry and would like to advance diet.  No other concerns or complaints.  Objective: Vital signs in last 24 hours: Filed Vitals:   07/15/11 1012 07/15/11 1030 07/15/11 1100 07/15/11 1113  BP: 168/89 158/90 153/91 146/89  Pulse: 100 101 113 106  Temp:    96.9 F (36.1 C)  TempSrc:    Oral  Resp: 16 12 19 20   Height:      Weight:      SpO2:    92%   Weight change: 11 lb 14.5 oz (5.4 kg)  Intake/Output Summary (Last 24 hours) at 07/15/11 1147 Last data filed at 07/14/11 2250  Gross per 24 hour  Intake    250 ml  Output    600 ml  Net   -350 ml    Physical Exam: General: Vital signs reviewed and noted. Well-developed, well-nourished, in no acute distress but tremulous on exam; alert, appears comfortable during HD treatment Head: Normocephalic, atraumatic. Eyes: PERRL, EOMI, No signs of anemia or jaundice. Neck: No deformities, masses, or tenderness noted.Supple, No carotid Bruits, no JVD. Lungs: Normal respiratory effort. Clear to auscultation BL anteriorly without crackles or wheezes. Heart: RRR. S1 and S2 normal without gallop, murmur, or rubs. Abdomen: BS hyperactive, taut, tender to palpation epigastric and LUQ Extremities: No pretibial edema. Bandage to right groin, clean, dry and intact, RUE HD access currently in use Neurologic: non-focal, A&Ox3, tremulous  Lab Results: Basic Metabolic Panel:  Lab 07/15/11 4540 07/14/11 0919  NA 134* 135  K 5.1 5.0  CL 96 96  CO2 20 23  GLUCOSE 75 70  BUN 42* 27*  CREATININE 7.56* 5.86*  CALCIUM 9.5 9.0  MG 2.0 --  PHOS 6.0* --   Liver Function Tests:  Lab 07/15/11 0550 07/14/11 0919 07/13/11 1929  AST -- 71* 61*  ALT -- 53 49  ALKPHOS -- 86 82  BILITOT -- 0.6 0.3  PROT -- 7.9 7.7  ALBUMIN 3.0* 3.0* --    Lab 07/13/11 1929  LIPASE 39  AMYLASE --   CBC:  Lab 07/15/11 0550 07/14/11 0919 07/13/11 1929  WBC  4.2 2.8* --  NEUTROABS -- -- 3.3  HGB 12.7* 12.5* --  HCT 38.1* 37.1* --  MCV 96.0 95.1 --  PLT 102* 83* --   Cardiac Enzymes:  Lab 07/14/11 1620 07/14/11 0919 07/13/11 1929  CKTOTAL 186 161 136  CKMB 7.1* 7.8* 6.7*  CKMBINDEX -- -- --  TROPONINI <0.30 <0.30 <0.30   CBG:  Lab 07/14/11 0651  GLUCAP 71   Thyroid Function Tests:  Lab 07/14/11 0919  TSH 0.744  T4TOTAL --  FREET4 --  T3FREE --  THYROIDAB --   Coagulation:  Lab 07/13/11 1929  LABPROT 12.8  INR 0.94    Drugs of Abuse     Component Value Date/Time   LABOPIA NEG 09/27/2010 1551   COCAINSCRNUR NEG 09/27/2010 1551   COCAINSCRNUR NONE DETECTED 09/03/2010 1852   LABBENZ NEG 09/27/2010 1551   LABBENZ POSITIVE* 09/03/2010 1852   AMPHETMU NEG 09/27/2010 1551   AMPHETMU NONE DETECTED 09/03/2010 1852   THCU NONE DETECTED 09/03/2010 1852   LABBARB  Value: NONE DETECTED        DRUG SCREEN FOR MEDICAL PURPOSES ONLY.  IF CONFIRMATION IS NEEDED FOR ANY PURPOSE, NOTIFY LAB WITHIN 5 DAYS.        LOWEST DETECTABLE LIMITS FOR URINE DRUG SCREEN Drug Class  Cutoff (ng/mL) Amphetamine      1000 Barbiturate      200 Benzodiazepine   200 Tricyclics       300 Opiates          300 Cocaine          300 THC              50 09/03/2010 1852    Alcohol Level:  Lab 07/14/11 0919  ETH <11     Micro Results: Recent Results (from the past 240 hour(s))  MRSA PCR SCREENING     Status: Abnormal   Collection Time   07/14/11  6:41 AM      Component Value Range Status Comment   MRSA by PCR POSITIVE (*) NEGATIVE  Final      Medications: I have reviewed the patient's current medications.  Assessment/Plan: 1. Respiratory distress, resolved: secondary to pulmonary edema from missed hemodialysis treatments x2, currently hemodynamically stable, COPD stable.  Pt receiving 2nd HD treatment today.   Plan  -continue monitoring in Step-down for now  -continue Albuterol and Xopenex prn   2. ETOH abuse/: reports sobriety with negative alcohol  level although patient with impressive generalized tremor, tachycardia, HTN, and some agitation concerning for withdrawal. Pt has significant hx for severe EtOH w/d and DTs. Plan  -continue close obs in SDU - continue Valium 10mg  PO tid  -will continue CIWA protocol with additional IV Ativan   3. Abd Pain: pt with h/o peptic ulcer disease and chronic pancreatitis, lipase is normal, KUB with stool filled colon and nonobstructive gas pattern, Abd U/S with suspected steatosis, bilateral renal atrophy  Plan  -continue morphine prn for pain  -continue protonix  -will consider constipation therapy once patient sitting upright   4. Hypertension: bp increasing to 170/100's  likely ESRD and EtOH withdrawl Plan  -continue Clonidine 0.1 mg bid  - ensure EtOH w/d treated with adequate dosing of benzos  5. ESRD on HD: 5kg fluid removed via HD on admission, metabolic acidosis resolving with anion gap = 16 (5 on admission). -will continue HD per Renal, appreciate recommendations   Tobacco Abuse: smokes 1/2ppd  - continue nicotine patch  6. DVT ppx: will d/c Heparin given prior oozing of right femoral HD site  Plan  -continue SCDs   [name] 07/15/2011, 11:47 AM

## 2011-07-15 NOTE — Progress Notes (Signed)
Subjective: Interval History: has no complaint of pain. He did have successful hemodialysis today but did have some clotting...   Objective: Vital signs in last 24 hours: Temp:  [96.9 F (36.1 C)-99.3 F (37.4 C)] 97.8 F (36.6 C) (12/10 1558) Pulse Rate:  [69-113] 105  (12/10 1558) Resp:  [10-22] 16  (12/10 1558) BP: (125-181)/(74-105) 134/93 mmHg (12/10 1558) SpO2:  [91 %-100 %] 94 % (12/10 1558) Weight:  [143 lb 15.4 oz (65.3 kg)-166 lb 10.7 oz (75.6 kg)] 143 lb 15.4 oz (65.3 kg) (12/10 1149)  Intake/Output from previous day: 12/09 0701 - 12/10 0700 In: 900 [P.O.:890; IV Piggyback:10] Out: 600 [Urine:600] Intake/Output this shift: Total I/O In: 600 [P.O.:600] Out: 3550 [Other:3550]  Extremities: Good thrill in his left upper arm AV graft diffuse tenderness and diffuse small false aneurysm formation.  Lab Results:  [facility] 07/15/11 0550 07/14/11 0919  WBC 4.2 2.8*  HGB 12.7* 12.5*  HCT 38.1* 37.1*  PLT 102* 83*   BMET  Basename 07/15/11 0550 07/14/11 0919  NA 134* 135  K 5.1 5.0  CL 96 96  CO2 20 23  GLUCOSE 75 70  BUN 42* 27*  CREATININE 7.56* 5.86*  CALCIUM 9.5 9.0    Studies/Results: Dg Abd 1 View  07/13/2011  *RADIOLOGY REPORT*  Clinical Data: Abdominal pain and distension.  Shortness of breath and nausea.  ABDOMEN - 1 VIEW  Comparison: 09/27/2010  Findings: Supine views of the abdomen demonstrate gas and stool throughout the colon without small or large bowel distension.  No obvious wall thickening.  No radiopaque stones.  Calcified phleboliths in the pelvis.  Degenerative changes in the lumbar spine.  IMPRESSION: Stool filled colon.  Nonobstructive bowel gas pattern.  Original Report Authenticated By: [name], M.D.   US Abdomen Complete  07/14/2011  *RADIOLOGY REPORT*  Clinical Data:  Hepatitis C, abdominal pain, nausea/vomiting, pancreatitis  COMPLETE ABDOMINAL ULTRASOUND  Comparison:  CT abdomen pelvis dated 04/03/2011  Findings:  Gallbladder:   No gallstones, gallbladder wall thickening, or pericholecystic fluid.  Negative sonographic Murphy's sign.  Common bile duct:  Measures 4 mm.  Liver:  Coarse, hyperechoic hepatic parenchyma, suggesting hepatic steatosis.  No focal lesion identified.  IVC:  Appears normal.  Pancreas:  Visualized portions are grossly unremarkable.  Spleen:  Measures 10.7 cm.  Right Kidney:  Cortical atrophy with echogenic renal parenchyma, compatible with end-stage renal disease.  Measures 8.4 cm.  No hydronephrosis.  Left Kidney:   Cortical atrophy with echogenic renal parenchyma, compatible with end-stage renal disease.  Measures 8.8 cm.  No hydronephrosis.  Abdominal aorta:  No aneurysm identified.  Additional comments: Small right pleural effusion.  IMPRESSION: Suspected hepatic steatosis.  Bilateral renal atrophy with echogenic renal parenchyma, compatible with end-stage renal disease.  Original Report Authenticated By: [name], M.D.   Dg Chest Port 1 View  07/13/2011  *RADIOLOGY REPORT*  Clinical Data: As of breath.  PORTABLE CHEST - 1 VIEW  Comparison: Portal chest 04/04/2011.  Findings: The heart is mildly enlarged.  Mild pulmonary vascular congestion is evident.  Minimal bibasilar atelectasis is present. There is improved aeration at the right lung base.  The upper lung fields are clear.  The visualized soft tissues and bony thorax are unremarkable.  IMPRESSION:  1.  Borderline cardiomegaly and pulmonary vascular congestion. This could represent [name] congestive heart failure. 2.  Minimal bibasilar atelectasis.  Original Report Authenticated By: [name]. [name], M.D.   Ir Shuntogram/fistulagram Left  07/13/2011  *RADIOLOGY REPORT*  Clinical  Data: Poor function of left arm straight graft  AV SHUNTOGRAM  Procedure:  The left arm was prepped and draped in a sterile fashion.  An 18 gauge Angiocath was inserted into the AV graft. Contrast was injected.  The Angiocath was removed.  Hemostasis was achieved  with direct pressure. No complication.  Findings: Arterial anastomosis, graft, venous anastomosis and central venous structures are all widely patent.  Small pseudoaneurysm in the distal graft is noted.  IMPRESSION: No significant stenosis in the left arm AV graft circuit.  Original Report Authenticated By: [name], M.D.   Anti-infectives: Anti-infectives    None      Assessment/Plan: s/p Procedure(s): INSERTION OF ARTERIOVENOUS (AV) GORE-TEX GRAFT ARM INSERTION OF DIALYSIS CATHETER I reviewed the patient's shuntogram from December 8. This shows no evidence of stenosis. He does have diffuse small aneurysmal formation. I discussed this with the patient. He continues to have difficulty with access despite no identifiable correctable problems with his graft with diffuse degeneration. I discussed the option of continued use of this graft versus abandoning his left upper arm graft. He understands this would work well require placement of a hemodialysis catheter and the new graft. I would place a new graft around the old graft in his left upper arm since his recent venogram showed good venous outflow. The patient is not willing to continue use of this graft the 2 ongoing difficulty with access and pain. We will plan elective new graft placement within the operative schedule permits this is tentatively scheduled for Friday, December 14 if the patient i likely stable he can be discharged and have this done as an outpatient.   LOS: 2 days   [name] F 07/15/2011, 4:31 PM

## 2011-07-15 NOTE — Consults (Signed)
Problem/Plan:  1. ESRD- access issues left arm AVG. Negative shuntogram. Usual HD is MWF.  2. SOB due to pulm edema and COPD. Better.  3. Abd pain, chronic pancreatitis - per primary  4. HTN/volume- meds and volume removal  S:still c/o access difficulties but running currently.  RN suggests longer 1 and 1/4" needle. O:BP 168/104  Pulse 74  Temp(Src) 97.7 F (36.5 C) (Oral)  Resp 10  Ht 6' (1.829 m)  Wt 69.1 kg (152 lb 5.4 oz)  BMI 20.66 kg/m2  SpO2 92%  Intake/Output Summary (Last 24 hours) at 07/15/11 0826 Last data filed at 07/14/11 2250  Gross per 24 hour  Intake    250 ml  Output    600 ml  Net   -350 ml   On dialysis BFR 300cc/min. Goal 5000cc Weight change: 5.4 kg (11 lb 14.5 oz) NWG:NFAO CVS:RRR ZHY:QMVH Ext:AVG with two needles with aneurysms Awake and alert, oriented    . calcium acetate  667 mg Oral TID WC  . Chlorhexidine Gluconate Cloth  6 each Topical Q0600  . cloNIDine  0.2 mg Oral BID  . diazepam  10 mg Oral TID  . folic acid  1 mg Oral Daily  . ipratropium  0.5 mg Nebulization Q6H  . morphine      . mupirocin ointment  1 application Nasal BID  . nicotine  14 mg Transdermal Daily  . pantoprazole (PROTONIX) IV  40 mg Intravenous Q24H  . sodium chloride  3 mL Intravenous Q12H  . thiamine  100 mg Oral Daily   Or  . thiamine  100 mg Intravenous Daily  . DISCONTD: folic acid  1 mg Oral Daily  . DISCONTD: guaiFENesin  600 mg Oral BID  . DISCONTD: heparin  5,000 Units Subcutaneous Q8H  . DISCONTD: lipase/protease/amylase  3 capsule Oral TID WC  . DISCONTD: multivitamins ther. w/minerals  1 tablet Oral Daily  . DISCONTD: pantoprazole  40 mg Oral Daily  . DISCONTD: pantoprazole (PROTONIX) IV  40 mg Intravenous Q24H  . DISCONTD: thiamine  100 mg Oral Daily   Dg Abd 1 View  07/13/2011  *RADIOLOGY REPORT*  Clinical Data: Abdominal pain and distension.  Shortness of breath and nausea.  ABDOMEN - 1 VIEW  Comparison: 09/27/2010  Findings: Supine views of the  abdomen demonstrate gas and stool throughout the colon without small or large bowel distension.  No obvious wall thickening.  No radiopaque stones.  Calcified phleboliths in the pelvis.  Degenerative changes in the lumbar spine.  IMPRESSION: Stool filled colon.  Nonobstructive bowel gas pattern.  Original Report Authenticated By: Marlon Pel, M.D.   US Abdomen Complete  07/14/2011  *RADIOLOGY REPORT*  Clinical Data:  Hepatitis C, abdominal pain, nausea/vomiting, pancreatitis  COMPLETE ABDOMINAL ULTRASOUND  Comparison:  CT abdomen pelvis dated 04/03/2011  Findings:  Gallbladder:  No gallstones, gallbladder wall thickening, or pericholecystic fluid.  Negative sonographic Murphy's sign.  Common bile duct:  Measures 4 mm.  Liver:  Coarse, hyperechoic hepatic parenchyma, suggesting hepatic steatosis.  No focal lesion identified.  IVC:  Appears normal.  Pancreas:  Visualized portions are grossly unremarkable.  Spleen:  Measures 10.7 cm.  Right Kidney:  Cortical atrophy with echogenic renal parenchyma, compatible with end-stage renal disease.  Measures 8.4 cm.  No hydronephrosis.  Left Kidney:   Cortical atrophy with echogenic renal parenchyma, compatible with end-stage renal disease.  Measures 8.8 cm.  No hydronephrosis.  Abdominal aorta:  No aneurysm identified.  Additional comments: Small right pleural  effusion.  IMPRESSION: Suspected hepatic steatosis.  Bilateral renal atrophy with echogenic renal parenchyma, compatible with end-stage renal disease.  Original Report Authenticated By: Charline Bills, M.D.   Dg Chest Port 1 View  07/13/2011  *RADIOLOGY REPORT*  Clinical Data: As of breath.  PORTABLE CHEST - 1 VIEW  Comparison: Portal chest 04/04/2011.  Findings: The heart is mildly enlarged.  Mild pulmonary vascular congestion is evident.  Minimal bibasilar atelectasis is present. There is improved aeration at the right lung base.  The upper lung fields are clear.  The visualized soft tissues and bony  thorax are unremarkable.  IMPRESSION:  1.  Borderline cardiomegaly and pulmonary vascular congestion. This could represent early congestive heart failure. 2.  Minimal bibasilar atelectasis.  Original Report Authenticated By: Jamesetta Orleans. MATTERN, M.D.   Ir Shuntogram/fistulagram Left  07/13/2011  *RADIOLOGY REPORT*  Clinical Data: Poor function of left arm straight graft  AV SHUNTOGRAM  Procedure:  The left arm was prepped and draped in a sterile fashion.  An 18 gauge Angiocath was inserted into the AV graft. Contrast was injected.  The Angiocath was removed.  Hemostasis was achieved with direct pressure. No complication.  Findings: Arterial anastomosis, graft, venous anastomosis and central venous structures are all widely patent.  Small pseudoaneurysm in the distal graft is noted.  IMPRESSION: No significant stenosis in the left arm AV graft circuit.  Original Report Authenticated By: Donavan Burnet, M.D.   BMET  Lab 07/15/11 0550 07/14/11 0919 07/14/11 0100 07/13/11 2023 07/13/11 1929 07/13/11 0814  NA 134* 135 139 134* 133* --  K 5.1 5.0 4.1 6.2* 6.0* 5.5*  CL 96 96 98 109 96 --  CO2 20 23 24  -- 18* --  GLUCOSE 75 70 45* 91 89 --  BUN 42* 27* 47* 59* 58* --  CREATININE 7.56* 5.86* 8.17* 10.10* 10.01* --  ALB -- -- -- -- -- --  CALCIUM 9.5 9.0 8.7 -- 9.3 --  PHOS 6.0* -- -- -- -- --   CBC  Lab 07/15/11 0550 07/14/11 0919 07/13/11 2023 07/13/11 1929  WBC 4.2 2.8* -- 4.3  NEUTROABS -- -- -- 3.3  HGB 12.7* 12.5* 13.6 12.3*  HCT 38.1* 37.1* 40.0 36.6*  MCV 96.0 95.1 -- 95.6  PLT 102* 83* -- 92*    [name] C

## 2011-07-16 DIAGNOSIS — J810 Acute pulmonary edema: Secondary | ICD-10-CM

## 2011-07-16 MED ORDER — HYDROCODONE-ACETAMINOPHEN 5-325 MG PO TABS: 2.0000 | ORAL_TABLET | Freq: Four times a day (QID) | ORAL | Status: DC | PRN

## 2011-07-16 MED ORDER — DIPHENHYDRAMINE HCL 50 MG/ML IJ SOLN
INTRAMUSCULAR | Status: AC
  Administered 2011-07-16: 25 mg via INTRAVENOUS
  Filled 2011-07-16: qty 1

## 2011-07-16 MED ORDER — TRAMADOL HCL 50 MG PO TABS: 100.0000 mg | ORAL_TABLET | Freq: Four times a day (QID) | ORAL | Status: DC | PRN

## 2011-07-16 MED ORDER — LORAZEPAM 2 MG/ML IJ SOLN
1.0000 mg | Freq: Once | INTRAMUSCULAR | Status: AC
  Administered 2011-07-16: 1 mg via INTRAVENOUS

## 2011-07-16 MED ORDER — DIPHENHYDRAMINE HCL 50 MG/ML IJ SOLN
25.0000 mg | Freq: Four times a day (QID) | INTRAMUSCULAR | Status: DC | PRN
  Administered 2011-07-16: 25 mg via INTRAVENOUS

## 2011-07-16 MED ORDER — RENA-VITE PO TABS: 1.0000 | ORAL_TABLET | Freq: Every day | ORAL | Status: DC

## 2011-07-16 MED ORDER — RENA-VITE PO TABS
1.0000 | ORAL_TABLET | Freq: Every day | ORAL | Status: DC
  Administered 2011-07-16: 1 via ORAL
  Filled 2011-07-16: qty 1

## 2011-07-16 MED ORDER — TRAMADOL HCL 50 MG PO TABS
100.0000 mg | ORAL_TABLET | Freq: Four times a day (QID) | ORAL | Status: DC
  Filled 2011-07-16 (×4): qty 2

## 2011-07-16 NOTE — Progress Notes (Signed)
As noted above . Dyspnea resolved with HD. Ready for discharge.  His access issues are being addressed by [facility].

## 2011-07-16 NOTE — Progress Notes (Signed)
Subjective:  No sob ,+" want some food before I go home" Objective Vital signs in last 24 hours: Filed Vitals:   07/16/11 0553 07/16/11 0800 07/16/11 0924 07/16/11 0945  BP: 150/87 155/103  144/83  Pulse: 78 95  81  Temp: 97 F (36.1 C)   97.5 F (36.4 C)  TempSrc: Oral   Oral  Resp: 18   20  Height:      Weight:      SpO2: 100%  100% 100%   Weight change: -10.3 kg (-22 lb 11.3 oz)  Intake/Output Summary (Last 24 hours) at 07/16/11 1118 Last data filed at 07/16/11 0954  Gross per 24 hour  Intake    603 ml  Output   3625 ml  Net  -3022 ml   Labs: Basic Metabolic Panel:  Lab 07/15/11 1478 07/14/11 0919 07/14/11 0100  NA 134* 135 139  K 5.1 5.0 4.1  CL 96 96 98  CO2 20 23 24   GLUCOSE 75 70 45*  BUN 42* 27* 47*  CREATININE 7.56* 5.86* 8.17*  CALCIUM 9.5 9.0 8.7  ALB -- -- --  PHOS 6.0* -- --   Liver Function Tests:  Lab 07/15/11 0550 07/14/11 0919 07/13/11 1929  AST -- 71* 61*  ALT -- 53 49  ALKPHOS -- 86 82  BILITOT -- 0.6 0.3  PROT -- 7.9 7.7  ALBUMIN 3.0* 3.0* 3.0*    Lab 07/13/11 1929  LIPASE 39  AMYLASE --   No results found for this basename: AMMONIA:3 in the last 168 hours CBC:  Lab 07/15/11 0550 07/14/11 0919 07/13/11 2023 07/13/11 1929  WBC 4.2 2.8* -- 4.3  NEUTROABS -- -- -- 3.3  HGB 12.7* 12.5* 13.6 --  HCT 38.1* 37.1* 40.0 --  MCV 96.0 95.1 -- 95.6  PLT 102* 83* -- 92*   Cardiac Enzymes:  Lab 07/14/11 1620 07/14/11 0919 07/13/11 1929  CKTOTAL 186 161 136  CKMB 7.1* 7.8* 6.7*  CKMBINDEX -- -- --  TROPONINI <0.30 <0.30 <0.30   CBG:  Lab 07/14/11 0651  GLUCAP 71    Iron Studies: No results found for this basename: IRON,TIBC,TRANSFERRIN,FERRITIN in the last 72 hours Studies/Results: US Abdomen Complete  07/14/2011  *RADIOLOGY REPORT*  Clinical Data:  Hepatitis C, abdominal pain, nausea/vomiting, pancreatitis  COMPLETE ABDOMINAL ULTRASOUND  Comparison:  CT abdomen pelvis dated 04/03/2011  Findings:  Gallbladder:  No gallstones,  gallbladder wall thickening, or pericholecystic fluid.  Negative sonographic Murphy's sign.  Common bile duct:  Measures 4 mm.  Liver:  Coarse, hyperechoic hepatic parenchyma, suggesting hepatic steatosis.  No focal lesion identified.  IVC:  Appears normal.  Pancreas:  Visualized portions are grossly unremarkable.  Spleen:  Measures 10.7 cm.  Right Kidney:  Cortical atrophy with echogenic renal parenchyma, compatible with end-stage renal disease.  Measures 8.4 cm.  No hydronephrosis.  Left Kidney:   Cortical atrophy with echogenic renal parenchyma, compatible with end-stage renal disease.  Measures 8.8 cm.  No hydronephrosis.  Abdominal aorta:  No aneurysm identified.  Additional comments: Small right pleural effusion.  IMPRESSION: Suspected hepatic steatosis.  Bilateral renal atrophy with echogenic renal parenchyma, compatible with end-stage renal disease.  Original Report Authenticated By: Charline Bills, M.D.   Medications:      . calcium acetate  667 mg Oral TID WC  . Chlorhexidine Gluconate Cloth  6 each Topical Q0600  . cloNIDine  0.2 mg Oral BID  . diazepam  10 mg Oral TID  . folic acid  1 mg Oral  Daily  . ipratropium  0.5 mg Nebulization Q6H  . LORazepam  1 mg Intravenous Once  . LORazepam  2 mg Intravenous Once  . mupirocin ointment  1 application Nasal BID  . nicotine  14 mg Transdermal Daily  . pantoprazole (PROTONIX) IV  40 mg Intravenous Q24H  . sodium chloride  3 mL Intravenous Q12H  . thiamine  100 mg Oral Daily   Or  . thiamine  100 mg Intravenous Daily  . traMADol  100 mg Oral Q6H   I  have reviewed scheduled and prn medications.  Physical Exam: General:alert nad Heart:RRR Lungs:sl decr. At bases, otherwise clear Abdomen:bspos. Sl hyper. Soft, min . epigastric tenderness Extremities: Dialysis Access:pos. Bruit left upper arm avgg,,some erythema and aneurysm formation.  no pedal edema  Problem/Plan: 1.Pulmonay Edema sec. To missed HD Secondary to Access IssuesSOB  resolved with hd. 2. Abd. Pain, chronic pancreatitis = asking for food, rx per TS 3. AVGG Issue= noted plans per Dr. Arbie Cookey new left avgg  And perm cath. Fri Dec 14. . 4. ESRD -MWF at [facility] 5. Anemia - no epo with hgb > 12.6 Secondary hyperparathyroidism - 6.0 phos, binders 7. . HTN/volume - see 1. Lenny Pastel, PA-C [facility] Beeper [phone] 07/16/2011,11:18 AM  LOS: 3 days

## 2011-07-16 NOTE — Progress Notes (Signed)
Patient complaint of itching at 0325.  MD notified because no anti-itch medication was on order.  MD to put in an order for an extra dose of ativan now and will be adding benadryl PRN.

## 2011-07-18 ENCOUNTER — Other Ambulatory Visit: Payer: Self-pay | Admitting: *Deleted

## 2011-07-18 ENCOUNTER — Ambulatory Visit (HOSPITAL_COMMUNITY): Admit: 2011-07-18 | Payer: Self-pay | Admitting: Vascular Surgery

## 2011-07-18 ENCOUNTER — Encounter (HOSPITAL_COMMUNITY): Payer: Self-pay

## 2011-07-18 SURGERY — ASSESSMENT, SHUNT FUNCTION, WITH CONTRAST RADIOGRAPHIC STUDY
Anesthesia: LOCAL | Laterality: Left

## 2011-07-18 NOTE — Discharge Summary (Signed)
Internal Medicine Teaching [facility] Discharge Note  Name: [name] MRN: [id number] DOB: [birth year] 55 y.o.  Date of Admission: 07/13/2011 Date of Discharge: 07/16/2011 Attending Physician: Staci Righter, MD  Discharge Diagnosis: Principal Problem:  *Respiratory distress, acute Active Problems:  End stage renal disease on dialysis  Pulmonary edema, acute  HYPERTENSION, ESSENTIAL, UNCONTROLLED  Chronic recurrent pancreatitis  Abdominal pain, generalized  Metabolic acidosis   Discharge Medications: .  ALBUTEROL SULFATE HFA 108 (90 BASE) MCG/ACT IN AERS  Inhalation  Inhale 2 puffs into the lungs every 6 (six) hours as needed. For shortness of breath.  .  AMYLASE-LIPASE-PROTEASE 33.09-15-35.5 MU PO CPEP  Oral  Take 3 capsules by mouth 3 (three) times daily with meals.  Marland Kitchen  RENA-VITE RX 1 MG PO TABS  Oral  Take 1 tablet by mouth daily.  Marland Kitchen  CALCIUM ACETATE 667 MG PO CAPS  Oral  Take 667 mg by mouth 3 (three) times daily with meals.  Marland Kitchen  CLONIDINE HCL 0.2 MG PO TABS  Oral  Take 0.2 mg by mouth 2 (two) times daily.  Marland Kitchen  FOLIC ACID 1 MG PO TABS  Oral  Take 1 mg by mouth daily.  Marland Kitchen  GABAPENTIN 100 MG PO CAPS  Oral  Take 100 mg by mouth 3 (three) times daily.  Marland Kitchen  ONDANSETRON HCL 4 MG PO TABS  Oral  Take 4 mg by mouth every 8 (eight) hours as needed. For nausea.  Marland Kitchen  PANTOPRAZOLE SODIUM 40 MG PO TBEC  Oral  Take 40 mg by mouth daily.  .  THIAMINE HCL 100 MG PO TABS  Oral  Take 100 mg by mouth daily.  Marland Kitchen  TIOTROPIUM BROMIDE MONOHYDRATE 18 MCG IN CAPS  Inhalation  Place 18 mcg into inhaler and inhale daily.   HYDROcodone-acetaminophen (NORCO) 5-325 MG per tablet  Oral Take 2 tablets by mouth every 6 (six) hours as needed. For pain.   Disposition and follow-up:   Mr.[name] was discharged from [facility] in Improved and Stable condition.    Follow-up Appointments: Continue HD per [facility]  MWF  Consultations:   Renal Service Vascular Surgery Pulmonary Critical Care Medicine [facility]  Procedures Performed:  Dg Abd 1 View  07/13/2011  *RADIOLOGY REPORT*  Clinical Data: Abdominal pain and distension.  Shortness of breath and nausea.  ABDOMEN - 1 VIEW  Comparison: 09/27/2010  Findings: Supine views of the abdomen demonstrate gas and stool throughout the colon without small or large bowel distension.  No obvious wall thickening.  No radiopaque stones.  Calcified phleboliths in the pelvis.  Degenerative changes in the lumbar spine.  IMPRESSION: Stool filled colon.  Nonobstructive bowel gas pattern.  Original Report Authenticated By: Marlon Pel, M.D.   US Abdomen Complete  07/14/2011  *RADIOLOGY REPORT*  Clinical Data:  Hepatitis C, abdominal pain, nausea/vomiting, pancreatitis  COMPLETE ABDOMINAL ULTRASOUND  Comparison:  CT abdomen pelvis dated 04/03/2011  Findings:  Gallbladder:  No gallstones, gallbladder wall thickening, or pericholecystic fluid.  Negative sonographic Murphy's sign.  Common bile duct:  Measures 4 mm.  Liver:  Coarse, hyperechoic hepatic parenchyma, suggesting hepatic steatosis.  No focal lesion identified.  IVC:  Appears normal.  Pancreas:  Visualized portions are grossly unremarkable.  Spleen:  Measures 10.7 cm.  Right Kidney:  Cortical atrophy with echogenic renal parenchyma, compatible with end-stage renal disease.  Measures 8.4 cm.  No hydronephrosis.  Left Kidney:   Cortical atrophy with echogenic renal parenchyma, compatible  with end-stage renal disease.  Measures 8.8 cm.  No hydronephrosis.  Abdominal aorta:  No aneurysm identified.  Additional comments: Small right pleural effusion.  IMPRESSION: Suspected hepatic steatosis.  Bilateral renal atrophy with echogenic renal parenchyma, compatible with end-stage renal disease.  Original Report Authenticated By: Charline Bills, M.D.   Dg Chest Port 1 View  07/13/2011  *RADIOLOGY REPORT*  Clinical  Data: As of breath.  PORTABLE CHEST - 1 VIEW  Comparison: Portal chest 04/04/2011.  Findings: The heart is mildly enlarged.  Mild pulmonary vascular congestion is evident.  Minimal bibasilar atelectasis is present. There is improved aeration at the right lung base.  The upper lung fields are clear.  The visualized soft tissues and bony thorax are unremarkable.  IMPRESSION:  1.  Borderline cardiomegaly and pulmonary vascular congestion. This could represent early congestive heart failure. 2.  Minimal bibasilar atelectasis.  Original Report Authenticated By: Jamesetta Orleans. MATTERN, M.D.   Ir Shuntogram/fistulagram Left  07/13/2011  *RADIOLOGY REPORT*  Clinical Data: Poor function of left arm straight graft  AV SHUNTOGRAM  Procedure:  The left arm was prepped and draped in a sterile fashion.  An 18 gauge Angiocath was inserted into the AV graft. Contrast was injected.  The Angiocath was removed.  Hemostasis was achieved with direct pressure. No complication.  Findings: Arterial anastomosis, graft, venous anastomosis and central venous structures are all widely patent.  Small pseudoaneurysm in the distal graft is noted.  IMPRESSION: No significant stenosis in the left arm AV graft circuit.  Original Report Authenticated By: Donavan Burnet, M.D.     Admission ZOX:[name] is a 55 y.o. male  has a past medical history of Pancytopenia; Polysubstance abuse; End-stage renal disease on hemodialysis; Malignant hypertension; Hepatitis C; COPD (chronic obstructive pulmonary disease); Chronic recurrent pancreatitis; Smoker; Alcohol abuse; Respiratory failure (Jan 2012); Alcohol withdrawal; Pancreatitis, alcoholic; and Renal disorder.  Presented with  Trouble breathing for the past few hours. Patient received only half a piece hemodialysis on Wednesday because his access had clotted. He was supposed to get this declotted today. He did not go for his hemodialysis on Friday. Today he had IR procedure to declot his  graft. After procedure he went home and started to develop rapid onset of rest respiratory distress he was struggling to breathe. No chest pain associated this. No cough no fever no chills. He did not endorse wheezing. Patient is very tremulous. He denies any recent alcohol abuse.  He states that he has abdominal pain on and off back at recently has become severe and it's in the left side of his abdomen. His abdomen also feels distended. He also had some nausea and vomiting associated with eating.  The patient usually makes very little amount of urine  Physical Exam:  Alert and Oriented in No Acute distress, tremulous, able to speak in full sentences oxygen saturation currently 100% on nonrebreather, somewhat disheveled  Mucous Membranes and Skin: Moist mucous membranes skin normal  Head Non traumatic, neck supple  Heart: Rapid but regular no murmurs appreciate  Lungs: Distant breath sounds bilaterally occasional crackles occasional mild wheezes, somewhat diminished air movement throughout  Abdomen: Distended no CVA tenderness no rebound tenderness, no peritoneal signs  Lower extremities: No clubbing cyanosis or edema  Neurologically Grossly intact  Skin Dry and intact no rash  body mass index is unknown because there is no height or weight on file.   Hospital Course by problem list:  1. Respiratory distress, acute: Patient was admitted to  ICU in respiratory distress, hyperkalemic, and in metabolic acidosis secondary to pulmonary edema from missed hemodialysis treatments x2.  He was treated with sodium bicarbonate, calcium gluconate, insulin D50, and emergently hemodialyzed.  Post-emergent hemodialysis he remained hemodynamically stable and was subsequent transferred to Step-down Unit.  During management on regular medical floor, he was transitioned to handheld inhalers and oxygenating at 100% on room air.  He will resume Spiriva and Albuterol prn as outpatient.  2. ESRD on HD: Patient  experienced clotting of his AV graft and therefore missed two hemodialysis treatments prior to admission. Renal Service was consulted and reports that patient underwent shuntogram which showed no occlusion or stenosis as outpatient but clots continued to be pulled from his AVG during this admission.  He therefore had a fem cath placed for HD access which had significantly bleeding which subsided after extending manual pressure time.  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 He was discharged with plans for new left AVG and perm cath per Dr. Arbie Cookey 07/19/11 and continue HD per [facility].  3. Abd Pain: Patient has h/o peptic ulcer disease and chronic pancreatitis with normal lipase on admission, KUB with stool filled colon and nonobstructive gas pattern, Abd U/S with suspected steatosis, and  bilateral renal atrophy.  He was continued on Protonix, treated with morphine for his pain,  and resumed his outpatient regimen of constipation therapy by time of discharge.  Of note the patient reported that Tramadol made him "nauseous" and requested an alternative analgesic before discharge home. He was subsequently prescribed a 3-4 day course of Vicodin 5-325mg .  4. Hypertension: Although improved from initial blood pressure measurements of  180-200's/90's-100's , Mr. Livers remained hypertensive throughout this admission.  He was managed with hemodialysis and titrated up to Clonidine 0.3 mg po bid with bp on discharge 137-166/79-105 with pulse 75-95.  5. ETOH abuse/Tobacco Abuse: Patient reported sobriety of several  months and had negative alcohol level.  He continues to smoke 1/2 ppd.  He was started on Valium 10mg  IV tid for prophylactic treatment of alcohol withdrawal, CIWA protocol with Ativan and nicotine patches.  He has been counseled extensively concerning the effects of alcohol and tobacco on his health.  Discharge Vitals:   T 97.2, P 77-95,  R20, Bp 138/79-166/105, O2 sat 100%  Discharge Labs:   Admission on 07/13/2011, Discharged on 07/16/2011  Component Date Value Range Status  . Glucose-Capillary (mg/dL) 16/05/9603 71  54-09 Final  . Sodium (mEq/L) 07/15/2011 134* 135-145 Final  . Potassium (mEq/L) 07/15/2011 5.1  3.5-5.1 Final  . Chloride (mEq/L) 07/15/2011 96  96-112 Final  . CO2 (mEq/L) 07/15/2011 20  19-32 Final  . Glucose, Bld (mg/dL) 81/19/1478 75  29-56 Final  . BUN (mg/dL) 21/30/8657 42* 8-46 Final   DELTA CHECK NOTED  . Creatinine, Ser (mg/dL) 96/29/5284 1.32* 4.40-1.02 Final  . Calcium (mg/dL) 72/53/6644 9.5  0.3-47.4 Final  . Phosphorus (mg/dL) 25/95/6387 6.0* 5.6-4.3 Final  . Albumin (g/dL) 32/95/1884 3.0* 1.6-6.0 Final  . GFR calc non Af Amer (mL/min) 07/15/2011 7* >90 Final  . GFR calc Af Amer (mL/min) 07/15/2011 8* >90 Final   Comment:                                 The eGFR has been calculated  using the CKD EPI equation.                          This calculation has not been                          validated in all clinical                          situations.                          eGFR's persistently                          <90 mL/min signify                          possible Chronic Kidney Disease.  Marland Kitchen HIV  07/14/2011 NON REACTIVE  NON REACTIVE Final  . WBC (K/uL) 07/15/2011 4.2  4.0-10.5 Final  . RBC (MIL/uL) 07/15/2011 3.97* 4.22-5.81 Final  . Hemoglobin (g/dL) 16/05/9603 54.0* 98.1-19.1 Final  . HCT (%) 07/15/2011 38.1* 39.0-52.0 Final  . MCV (fL) 07/15/2011 96.0  78.0-100.0 Final  . MCH (pg) 07/15/2011 32.0  26.0-34.0 Final  . MCHC (g/dL) 47/82/9562 13.0  86.5-78.4 Final  . RDW (%) 07/15/2011 19.4* 11.5-15.5 Final  . Platelets (K/uL) 07/15/2011 102* 150-400 Final   CONSISTENT WITH PREVIOUS RESULT  . Magnesium (mg/dL) 69/62/9528 2.0  4.1-3.2 Final     Signed: Kristie Cowman 07/18/2011, 2:58 PM

## 2011-07-18 NOTE — Progress Notes (Signed)
Called Dr Bosie Helper office, left voicemail for [place] requesting orders.

## 2011-07-19 ENCOUNTER — Inpatient Hospital Stay (HOSPITAL_COMMUNITY): Payer: Medicare Other

## 2011-07-19 ENCOUNTER — Encounter (HOSPITAL_COMMUNITY): Payer: Self-pay | Admitting: Certified Registered"

## 2011-07-19 ENCOUNTER — Inpatient Hospital Stay (HOSPITAL_COMMUNITY): Payer: Medicare Other | Admitting: Certified Registered"

## 2011-07-19 ENCOUNTER — Ambulatory Visit (HOSPITAL_COMMUNITY)
Admission: AD | Admit: 2011-07-19 | Discharge: 2011-07-19 | Disposition: A | Discharge status: Home / Self Care | Payer: Medicare Other | Source: Ambulatory Visit | Attending: Vascular Surgery | Admitting: Vascular Surgery

## 2011-07-19 ENCOUNTER — Encounter (HOSPITAL_COMMUNITY)
Admission: AD | Disposition: A | Discharge status: Home / Self Care | Payer: Self-pay | Source: Ambulatory Visit | Attending: Vascular Surgery

## 2011-07-19 ENCOUNTER — Encounter (HOSPITAL_COMMUNITY): Payer: Self-pay | Admitting: *Deleted

## 2011-07-19 DIAGNOSIS — E872 Acidosis, unspecified: Secondary | ICD-10-CM | POA: Insufficient documentation

## 2011-07-19 DIAGNOSIS — K861 Other chronic pancreatitis: Secondary | ICD-10-CM | POA: Insufficient documentation

## 2011-07-19 DIAGNOSIS — J4489 Other specified chronic obstructive pulmonary disease: Secondary | ICD-10-CM | POA: Insufficient documentation

## 2011-07-19 DIAGNOSIS — Z992 Dependence on renal dialysis: Secondary | ICD-10-CM

## 2011-07-19 DIAGNOSIS — J449 Chronic obstructive pulmonary disease, unspecified: Secondary | ICD-10-CM

## 2011-07-19 DIAGNOSIS — R1084 Generalized abdominal pain: Secondary | ICD-10-CM | POA: Insufficient documentation

## 2011-07-19 DIAGNOSIS — I10 Essential (primary) hypertension: Secondary | ICD-10-CM | POA: Insufficient documentation

## 2011-07-19 DIAGNOSIS — N186 End stage renal disease: Secondary | ICD-10-CM

## 2011-07-19 DIAGNOSIS — I120 Hypertensive chronic kidney disease with stage 5 chronic kidney disease or end stage renal disease: Secondary | ICD-10-CM | POA: Insufficient documentation

## 2011-07-19 DIAGNOSIS — R0602 Shortness of breath: Secondary | ICD-10-CM | POA: Insufficient documentation

## 2011-07-19 DIAGNOSIS — R0603 Acute respiratory distress: Secondary | ICD-10-CM | POA: Diagnosis present

## 2011-07-19 DIAGNOSIS — J810 Acute pulmonary edema: Secondary | ICD-10-CM | POA: Insufficient documentation

## 2011-07-19 HISTORY — DX: Burn of unspecified body region, unspecified degree: T30.0

## 2011-07-19 HISTORY — PX: AV FISTULA PLACEMENT: SHX1204

## 2011-07-19 HISTORY — PX: INSERTION OF DIALYSIS CATHETER: SHX1324

## 2011-07-19 LAB — POCT I-STAT 4, (NA,K, GLUC, HGB,HCT): Glucose, Bld: 87 mg/dL (ref 70–99)

## 2011-07-19 LAB — PROTIME-INR
INR: 0.94 (ref 0.00–1.49)
Prothrombin Time: 12.8 seconds (ref 11.6–15.2)

## 2011-07-19 SURGERY — INSERTION OF ARTERIOVENOUS (AV) GORE-TEX GRAFT ARM
Anesthesia: General | Site: Neck | Laterality: Right | Wound class: Clean

## 2011-07-19 MED ORDER — CEFAZOLIN SODIUM 1-5 GM-% IV SOLN
INTRAVENOUS | Status: AC
  Filled 2011-07-19: qty 50

## 2011-07-19 MED ORDER — HEPARIN SODIUM (PORCINE) 1000 UNIT/ML IJ SOLN
INTRAMUSCULAR | Status: DC | PRN
  Administered 2011-07-19: 1000 [IU]

## 2011-07-19 MED ORDER — CEFAZOLIN SODIUM 1-5 GM-% IV SOLN
INTRAVENOUS | Status: DC | PRN
  Administered 2011-07-19: 1 g via INTRAVENOUS

## 2011-07-19 MED ORDER — SODIUM CHLORIDE 0.9 % IR SOLN
Status: DC | PRN
  Administered 2011-07-19: 1000 mL

## 2011-07-19 MED ORDER — MIDAZOLAM HCL 5 MG/5ML IJ SOLN
INTRAMUSCULAR | Status: DC | PRN
  Administered 2011-07-19: 2 mg via INTRAVENOUS

## 2011-07-19 MED ORDER — HYDROMORPHONE HCL PF 1 MG/ML IJ SOLN: 0.5000 mg | INTRAMUSCULAR | Status: DC | PRN

## 2011-07-19 MED ORDER — SODIUM CHLORIDE 0.9 % IR SOLN
Status: DC | PRN
  Administered 2011-07-19: 10:00:00

## 2011-07-19 MED ORDER — PROPOFOL 10 MG/ML IV EMUL
INTRAVENOUS | Status: DC | PRN
  Administered 2011-07-19: 200 mL via INTRAVENOUS

## 2011-07-19 MED ORDER — HYDROMORPHONE HCL PF 1 MG/ML IJ SOLN
0.2500 mg | INTRAMUSCULAR | Status: DC | PRN
  Administered 2011-07-19 (×5): 0.5 mg via INTRAVENOUS

## 2011-07-19 MED ORDER — ONDANSETRON HCL 4 MG/2ML IJ SOLN: 4.0000 mg | Freq: Once | INTRAMUSCULAR | Status: DC | PRN

## 2011-07-19 MED ORDER — SODIUM CHLORIDE 0.9 % IV SOLN
INTRAVENOUS | Status: DC
  Administered 2011-07-19: 10:00:00 via INTRAVENOUS

## 2011-07-19 MED ORDER — PHENYLEPHRINE HCL 10 MG/ML IJ SOLN
INTRAMUSCULAR | Status: DC | PRN
  Administered 2011-07-19: 80 ug via INTRAVENOUS

## 2011-07-19 MED ORDER — LIDOCAINE HCL (CARDIAC) 20 MG/ML IV SOLN
INTRAVENOUS | Status: DC | PRN
  Administered 2011-07-19: 100 mg via INTRAVENOUS

## 2011-07-19 MED ORDER — ONDANSETRON HCL 4 MG/2ML IJ SOLN
INTRAMUSCULAR | Status: DC | PRN
  Administered 2011-07-19: 4 mg via INTRAVENOUS

## 2011-07-19 MED ORDER — OXYCODONE-ACETAMINOPHEN 5-325 MG PO TABS: 1.0000 | ORAL_TABLET | ORAL | Status: AC | PRN

## 2011-07-19 SURGICAL SUPPLY — 60 items
BAG DECANTER FOR FLEXI CONT (MISCELLANEOUS) IMPLANT
BENZOIN TINCTURE PRP APPL 2/3 (GAUZE/BANDAGES/DRESSINGS) ×3 IMPLANT
CANISTER SUCTION 2500CC (MISCELLANEOUS) ×3 IMPLANT
CATH CANNON HEMO 15F 50CM (CATHETERS) IMPLANT
CATH CANNON HEMO 15FR 19 (HEMODIALYSIS SUPPLIES) IMPLANT
CATH CANNON HEMO 15FR 23CM (HEMODIALYSIS SUPPLIES) ×3 IMPLANT
CATH CANNON HEMO 15FR 31CM (HEMODIALYSIS SUPPLIES) IMPLANT
CATH CANNON HEMO 15FR 32CM (HEMODIALYSIS SUPPLIES) IMPLANT
CLIP LIGATING EXTRA MED SLVR (CLIP) ×3 IMPLANT
CLIP LIGATING EXTRA SM BLUE (MISCELLANEOUS) ×3 IMPLANT
CLOTH BEACON ORANGE TIMEOUT ST (SAFETY) ×3 IMPLANT
COVER PROBE W GEL 5X96 (DRAPES) IMPLANT
COVER SURGICAL LIGHT HANDLE (MISCELLANEOUS) ×6 IMPLANT
DECANTER SPIKE VIAL GLASS SM (MISCELLANEOUS) ×3 IMPLANT
DRAPE C-ARM 42X72 X-RAY (DRAPES) ×3 IMPLANT
DRAPE CHEST BREAST 15X10 FENES (DRAPES) ×3 IMPLANT
ELECT REM PT RETURN 9FT ADLT (ELECTROSURGICAL) ×3
ELECTRODE REM PT RTRN 9FT ADLT (ELECTROSURGICAL) ×2 IMPLANT
GAUZE SPONGE 2X2 8PLY STRL LF (GAUZE/BANDAGES/DRESSINGS) ×2 IMPLANT
GAUZE SPONGE 4X4 16PLY XRAY LF (GAUZE/BANDAGES/DRESSINGS) ×3 IMPLANT
GEL ULTRASOUND 20GR AQUASONIC (MISCELLANEOUS) IMPLANT
GLOVE BIO SURGEON STRL SZ 6.5 (GLOVE) ×3 IMPLANT
GLOVE BIOGEL PI IND STRL 6.5 (GLOVE) ×8 IMPLANT
GLOVE BIOGEL PI INDICATOR 6.5 (GLOVE) ×4
GLOVE ECLIPSE 6.5 STRL STRAW (GLOVE) ×6 IMPLANT
GLOVE SS BIOGEL STRL SZ 7.5 (GLOVE) ×2 IMPLANT
GLOVE SUPERSENSE BIOGEL SZ 7.5 (GLOVE) ×1
GOWN STRL NON-REIN LRG LVL3 (GOWN DISPOSABLE) ×9 IMPLANT
GRAFT GORETEX 6X40 (Vascular Products) ×3 IMPLANT
KIT BASIN OR (CUSTOM PROCEDURE TRAY) IMPLANT
KIT ROOM TURNOVER OR (KITS) ×3 IMPLANT
NEEDLE 18GX1X1/2 (RX/OR ONLY) (NEEDLE) ×3 IMPLANT
NEEDLE 22X1 1/2 (OR ONLY) (NEEDLE) ×3 IMPLANT
NEEDLE HYPO 25GX1X1/2 BEV (NEEDLE) ×3 IMPLANT
NS IRRIG 1000ML POUR BTL (IV SOLUTION) ×3 IMPLANT
PACK CHEST (CUSTOM PROCEDURE TRAY) IMPLANT
PACK CV ACCESS (CUSTOM PROCEDURE TRAY) ×3 IMPLANT
PACK SURGICAL SETUP 50X90 (CUSTOM PROCEDURE TRAY) IMPLANT
PAD ARMBOARD 7.5X6 YLW CONV (MISCELLANEOUS) ×6 IMPLANT
SOAP 2 % CHG 4 OZ (WOUND CARE) ×3 IMPLANT
SPONGE GAUZE 2X2 STER 10/PKG (GAUZE/BANDAGES/DRESSINGS) ×1
SPONGE GAUZE 4X4 12PLY (GAUZE/BANDAGES/DRESSINGS) ×3 IMPLANT
STRIP CLOSURE SKIN 1/2X4 (GAUZE/BANDAGES/DRESSINGS) ×3 IMPLANT
SUT ETHILON 3 0 PS 1 (SUTURE) ×3 IMPLANT
SUT PROLENE 6 0 CC (SUTURE) ×6 IMPLANT
SUT SILK 2 0 FS (SUTURE) IMPLANT
SUT VIC AB 3-0 SH 27 (SUTURE) ×2
SUT VIC AB 3-0 SH 27X BRD (SUTURE) ×4 IMPLANT
SUT VICRYL 4-0 PS2 18IN ABS (SUTURE) ×3 IMPLANT
SYR 20CC LL (SYRINGE) ×3 IMPLANT
SYR 30ML LL (SYRINGE) IMPLANT
SYR 5ML LL (SYRINGE) ×6 IMPLANT
SYR CONTROL 10ML LL (SYRINGE) IMPLANT
SYRINGE 10CC LL (SYRINGE) ×3 IMPLANT
TAPE CLOTH SURG 4X10 WHT LF (GAUZE/BANDAGES/DRESSINGS) ×6 IMPLANT
TAPE STRIPS DRAPE STRL (GAUZE/BANDAGES/DRESSINGS) ×3 IMPLANT
TOWEL OR 17X24 6PK STRL BLUE (TOWEL DISPOSABLE) ×3 IMPLANT
TOWEL OR 17X26 10 PK STRL BLUE (TOWEL DISPOSABLE) ×3 IMPLANT
UNDERPAD 30X30 INCONTINENT (UNDERPADS AND DIAPERS) ×3 IMPLANT
WATER STERILE IRR 1000ML POUR (IV SOLUTION) ×3 IMPLANT

## 2011-07-19 NOTE — Addendum Note (Signed)
Addendum  created 07/19/11 1406 by Rivka Barbara, MD   Modules edited:Orders

## 2011-07-19 NOTE — Transfer of Care (Signed)
Immediate Anesthesia Transfer of Care Note  Patient: MARKEY DEADY  Procedure(s) Performed:  INSERTION OF ARTERIOVENOUS (AV) GORE-TEX GRAFT ARM - 6mm x 40cm standard wall goretex graft inserted left upper arm surgical time 1031-1136; INSERTION OF DIALYSIS CATHETER - Inserted 28cm Dialysis catheter right internal jugular  Surgical time 1150-1203  Patient Location: PACU  Anesthesia Type: General  Level of Consciousness: awake, alert  and oriented  Airway & Oxygen Therapy: Patient Spontanous Breathing and Patient connected to nasal cannula oxygen  Post-op Assessment: Report given to PACU RN, Post -op Vital signs reviewed and stable and Patient moving all extremities X 4  Post vital signs: Reviewed and stable  Complications: No apparent anesthesia complications

## 2011-07-19 NOTE — H&P (View-Only) (Signed)
Vascular and Vein Specialist of       Consult Note  Patient name: [name] MRN: [id number] DOB: August 24, 1955 Sex: male  Consulting Physician:  Renal  Reason for Consult:  Chief Complaint  Patient presents with  . Pancreatitis  . Shortness of Breath    HISTORY OF PRESENT ILLNESS: The patient was admitted for shortness of breath.  He has had problems with pulling clots at dialysis.  He required a femoral catheter yesterday to dialyse.  This was removed this morning due to bleeding.  A recent graft study by radiology does not show any stenoses within the graft.  I am asked to provide assistance.  Past Medical History  Diagnosis Date  . Pancytopenia      chronic  . Polysubstance abuse     chronic most notable for alcohol  . End-stage renal disease on hemodialysis     HD on MWF, [facility]  . Malignant hypertension   . Hepatitis C   . COPD (chronic obstructive pulmonary disease)   . Chronic recurrent pancreatitis     likely secondary to alcoholism  . Smoker   . Alcohol abuse   . Respiratory failure Jan 2012    Hx of VDRF   . Alcohol withdrawal   . Pancreatitis, alcoholic   . Renal disorder     Past Surgical History  Procedure Date  . Av fistula placement     History   Social History  . Marital Status: Divorced    Spouse Name: N/A    Number of Children: N/A  . Years of Education: N/A   Occupational History  . Not on file.   Social History Main Topics  . Smoking status: Former Smoker -- 0.3 packs/day for 15 years    Types: Cigarettes  . Smokeless tobacco: Not on file  . Alcohol Use: 25.0 oz/week    50 drink(s) per week     02/28/11: currently not drinking, 04/04/11: drinking a fifth (25 ounces/75ccs) of vodka  3 days/week, 07/13/11 now says have not had any alcohol for 1 year but is tremolous.  . Drug Use: No  . Sexually Active: Not on file         Other Topics Concern  . Not on file   Social History Narrative    unemployed  divorced , used to drink one bottle of whiskey for years. States he is currently drinking 1 5th of the cheapest vodka they have.As of 02/28/2011:Lives [address] House, sister-in-law's house, lives there alone    Family History  Problem Relation Age of Onset  . Hypertension Mother   . Stroke Mother   . Alcohol abuse    . Anxiety disorder    . Hyperlipidemia    . Stroke      Allergies as of 07/13/2011  . (No Known Allergies)    Current Facility-Administered Medications on File Prior to Encounter  Medication Dose Route Frequency Provider Last Rate Last Dose  . morphine 2 MG/ML injection           . DISCONTD: alteplase (ACTIVASE) injection 2 mg  2 mg Intracatheter Once Art A Hoss      . DISCONTD: fentaNYL (SUBLIMAZE) injection 25-200 mcg  25-200 mcg Intravenous Q5 min PRN Art A Hoss      . DISCONTD: iohexol (OMNIPAQUE) 300 MG/ML solution 32 mL  32 mL Intravenous Once PRN Art A Hoss      . DISCONTD: midazolam (VERSED) injection 1-10 mg  1-10  mg Intravenous Q5 min PRN Art A Hoss       Current Outpatient Prescriptions on File Prior to Encounter  Medication Sig Dispense Refill  . albuterol (VENTOLIN HFA) 108 (90 BASE) MCG/ACT inhaler Inhale 2 puffs into the lungs every 6 (six) hours as needed. For shortness of breath.      Marland Kitchen amylase-lipase-protease (LIPRAM-CR10) 33.09-15-35.5 MU per capsule Take 3 capsules by mouth 3 (three) times daily with meals.        . B Complex-C-Folic Acid (RENA-VITE RX) 1 MG TABS Take 1 tablet by mouth daily.        . calcium acetate (PHOSLO) 667 MG capsule Take 667 mg by mouth 3 (three) times daily with meals.        . folic acid (FOLVITE) 1 MG tablet Take 1 mg by mouth daily.        Marland Kitchen thiamine 100 MG tablet Take 100 mg by mouth daily.        Marland Kitchen tiotropium (SPIRIVA) 18 MCG inhalation capsule Place 18 mcg into inhaler and inhale daily.        Marland Kitchen DISCONTD: pantoprazole (PROTONIX) 40 MG tablet Take 1 tablet (40 mg total) by mouth daily.  30 tablet  1     REVIEW  OF SYSTEMS: See H&P from 12/8.  No changes  PHYSICAL EXAMINATION: General: The patient appears their stated age.  Vital signs are BP 149/87  Pulse 75  Temp(Src) 98.7 F (37.1 C) (Oral)  Resp 12  Ht 6' (1.829 m)  Wt 166 lb 10.7 oz (75.6 kg)  BMI 22.60 kg/m2  SpO2 94% Pulmonary: Respirations are non-labored HEENT:  No gross abnormalities Abdomen: mild tenderness Musculoskeletal: There are no major deformities.   Neurologic: No focal weakness or paresthesias are detected, Skin: There are no ulcer or rashes noted. Psychiatric: The patient has normal affect. Cardiovascular: thrill over access site in L UE .  Diagnostic Studies: I have reviewed his recent IR study.  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  The only explanation I see for difficulty is that the graft is worn out.  It that is the case, the patient will need new access along with a catheter.     Jorge Ny, M.D. [facility] [place] Office: [phone] Pager:  [phone]

## 2011-07-19 NOTE — Progress Notes (Signed)
Dr. Katrinka Blazing notified that pt. Still c/o pain to L upper arm and R upper chest. Order given.

## 2011-07-19 NOTE — Op Note (Signed)
OPERATIVE REPORT  DATE OF SURGERY: 07/19/2011  PATIENT: [name], 55 y.o. male MRN: [id number]  DOB: [birth year]  PRE-OPERATIVE DIAGNOSIS: End-stage renal disease with poorly functioning left upper arm AV graft  POST-OPERATIVE DIAGNOSIS:  Same  PROCEDURE: #1 left upper arm AV Gore-Tex graft, #2 right IJ dietetic catheter with ultrasound and visualization  SURGEON:  Gretta Began, M.D.  PHYSICIAN ASSISTANT: Nurse  ANESTHESIA:  Gen.  EBL: Minimal ml  Total I/O In: 300 [I.V.:300] Out: -   BLOOD ADMINISTERED: None  DRAINS: None  SPECIMEN: None  COUNTS CORRECT:  YES  PLAN OF CARE: Discharge following PACU   PATIENT DISPOSITION:  PACU - hemodynamically stable  PROCEDURE DETAILS: The patient was taken to the operating room placed supine position where the area of the left arm and left exit were prepped and draped in the usual sterile fashion. An incision was made over the brachial anastomosis and the axillary anastomosis. The Gore-Tex graft was exposed at both of these. The patient had a widely patient patent axillary vein. A new tunnel was created lateral to the old graft and a 6 mm Gore-Tex graft was brought through the tunnel. The axillary vein was occluded proximally and the graft was occluded near the brachial artery anastomosis. The venous anastomosis was resected and the vein was spatulated. The new graft was spatulated and sewn end-to-end to the axillary vein. The graft was flushed with heparinized and reoccluded. Next the graft was transected near the brachial artery anastomosis and then the graft was sewn end-to-end to the old graft with running 6-0 Prolene suture. Clamps removed and excellent thrill was noted. Wound irrigated with saline and hemostasis was obtained with electrocautery. This wound was closed with 3-0 Vicryl in the subcutaneous and subcuticular tissues.  Attention was then turned to the neck where the patient was placed in Trendelenburg position. The right  and left neck were imaged had widely patent jugular veins bilaterally. The patient neck was prepped in a sterile fashion. The patient was placed in Trendelenburg position. Using local anesthesia and a finder needle the right internal jugular vein was accessed. Next using Seldinger technique a guidewire was passed to the level of the right atrium. The dilator and peel-away sheath was passed over the guidewire and the dilator and guidewire removed. A 28 cm hemodialysis cath was passed through the peel-away sheath which was then removed. Catheter brought through subcutaneous tunnel through a separate stab incision. A 2 lm ports were attached. Both lumens flushed and aspirated easily reluctant without unit per cc heparin. The cath was secured to the skin with a 3-0 nylon stitch and the underside for the for subcuticular Vicryl stitch. A dressing was applied and the patient was taken to the recovery room where chest x-ray is pending   Gretta Began, M.D. 07/19/2011 12:32 PM

## 2011-07-19 NOTE — Preoperative (Signed)
Beta Blockers   Reason not to administer Beta Blockers:Not Applicable 

## 2011-07-19 NOTE — Anesthesia Postprocedure Evaluation (Signed)
  Anesthesia Post-op Note  Patient: [name]  Procedure(s) Performed:  INSERTION OF ARTERIOVENOUS (AV) GORE-TEX GRAFT ARM - 6mm x 40cm standard wall goretex graft inserted left upper arm surgical time 1031-1136; INSERTION OF DIALYSIS CATHETER - Inserted 28cm Dialysis catheter right internal jugular  Surgical time 1150-1203  Patient Location: PACU  Anesthesia Type: General  Level of Consciousness: awake, alert , oriented and patient cooperative  Airway and Oxygen Therapy: Patient Spontanous Breathing and Patient connected to nasal cannula oxygen  Post-op Pain: moderate  Post-op Assessment: Post-op Vital signs reviewed, Patient's Cardiovascular Status Stable, Respiratory Function Stable, Patent Airway, No signs of Nausea or vomiting and Pain level controlled  Post-op Vital Signs: stable  Complications: No apparent anesthesia complications

## 2011-07-19 NOTE — Anesthesia Preprocedure Evaluation (Addendum)
Anesthesia Evaluation  Patient identified by MRN, date of birth, ID band Patient awake    Reviewed: Allergy & Precautions, H&P , NPO status , Patient's Chart, lab work & pertinent test results, reviewed documented beta blocker date and time   History of Anesthesia Complications (+) DIFFICULT AIRWAY  Airway Mallampati: I TM Distance: >3 FB Neck ROM: full    Dental   Pulmonary COPD         Cardiovascular hypertension, regular Normal    Neuro/Psych PSYCHIATRIC DISORDERS Negative Neurological ROS     GI/Hepatic PUD, (+) Hepatitis -, C  Endo/Other    Renal/GU ESRF, CRF and Dialysis  Genitourinary negative   Musculoskeletal   Abdominal   Peds  Hematology negative hematology ROS (+)   Anesthesia Other Findings   Reproductive/Obstetrics                          Anesthesia Physical Anesthesia Plan  ASA: III  Anesthesia Plan: MAC and General   Post-op Pain Management:    Induction: Intravenous  Airway Management Planned: LMA  Additional Equipment:   Intra-op Plan:   Post-operative Plan: Extubation in OR  Informed Consent: I have reviewed the patients History and Physical, chart, labs and discussed the procedure including the risks, benefits and alternatives for the proposed anesthesia with the patient or authorized representative who has indicated his/her understanding and acceptance.     Plan Discussed with: Anesthesiologist, CRNA and Surgeon  Anesthesia Plan Comments:       Anesthesia Quick Evaluation

## 2011-07-19 NOTE — Interval H&P Note (Signed)
History and Physical Interval Note:  07/19/2011 9:48 AM  [name]  has presented today for surgery, with the diagnosis of ESRD  The various methods of treatment have been discussed with the patient and family. After consideration of risks, benefits and other options for treatment, the patient has consented to  Procedure(s): INSERTION OF ARTERIOVENOUS (AV) GORE-TEX GRAFT ARM INSERTION OF DIALYSIS CATHETER as a surgical intervention .  The patients' history has been reviewed, patient examined, no change in status, stable for surgery.  I have reviewed the patients' chart and labs.  Questions were answered to the patient's satisfaction.     Larina Earthly

## 2011-07-19 NOTE — OR Nursing (Signed)
Late entry: Radiology tech. And machine/AMasonRN

## 2011-07-23 ENCOUNTER — Encounter (HOSPITAL_COMMUNITY): Payer: Self-pay | Admitting: Vascular Surgery

## 2011-08-06 IMAGING — CT CT ABD-PELV W/ CM
2 of 6 series · 17 of 46 positions shown, 19 images · IV contrast (APPLIED)
Comparison: 09/28/2010

CLINICAL DATA: Right abdominal pain, nausea.

CT ABDOMEN AND PELVIS WITH CONTRAST
TECHNIQUE: Multidetector CT imaging of the abdomen and pelvis was
performed following the standard protocol during bolus
administration of intravenous contrast.
Contrast: 100 ml Omnipaque 300 IV.

[Series 2: abd/pelv with 5.0 b31f st · axial · 0.64mm/px · z∈[-393,+2]mm · 14 of 91 slices shown, 16 images]
[im 6/91  soft-tissue]
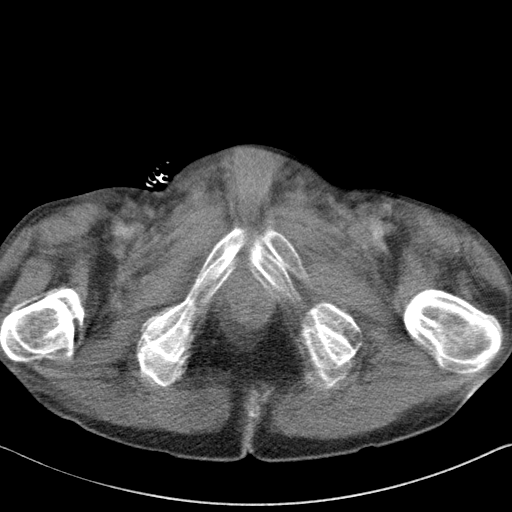
[im 6/91  bone]
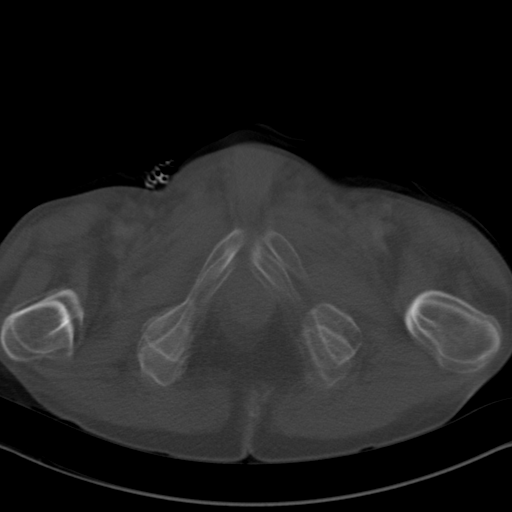
[im 12/91  soft-tissue]
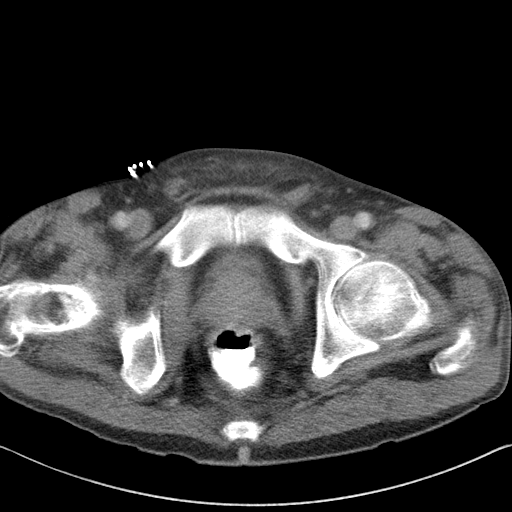
[im 17/91  soft-tissue]
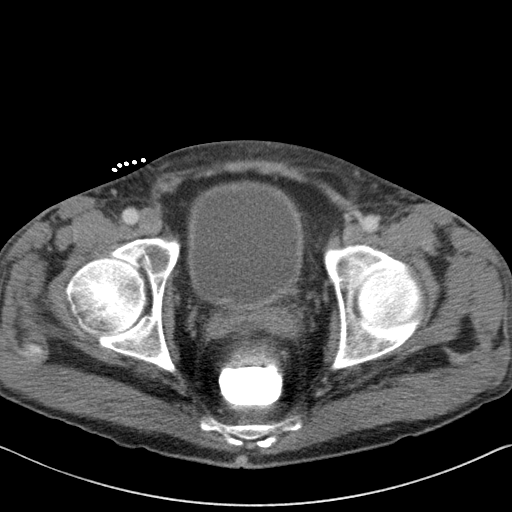
[im 23/91  soft-tissue]
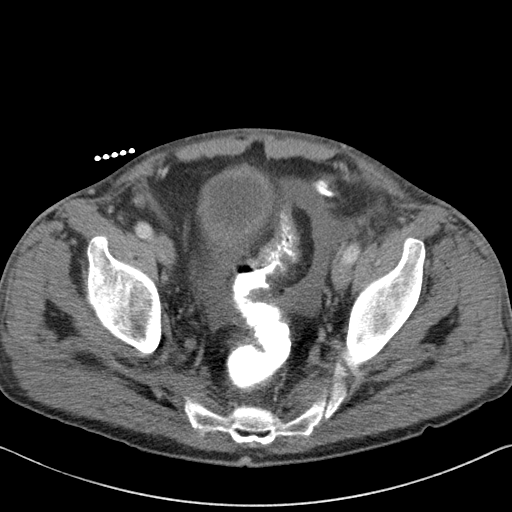
[im 29/91  soft-tissue]
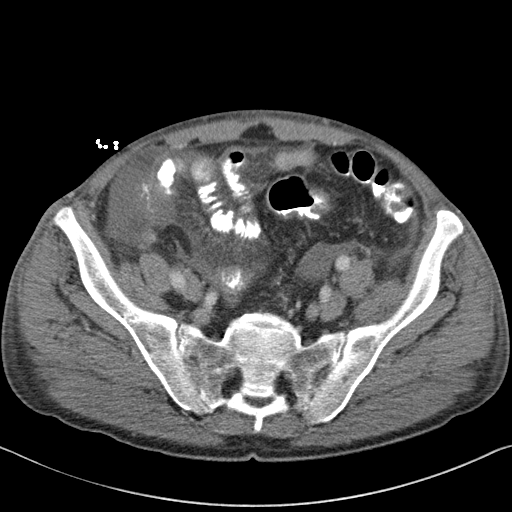
[im 34/91  soft-tissue]
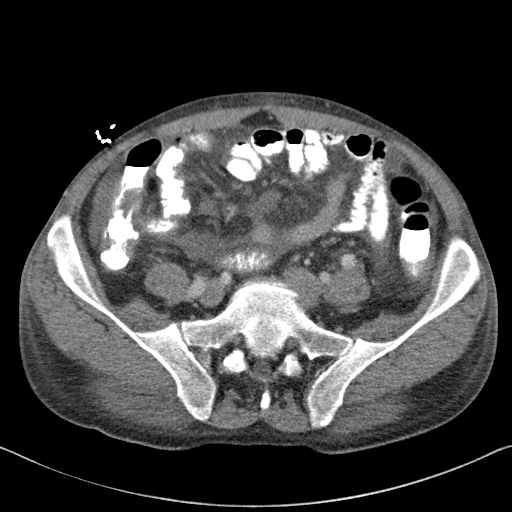
[im 40/91  soft-tissue]
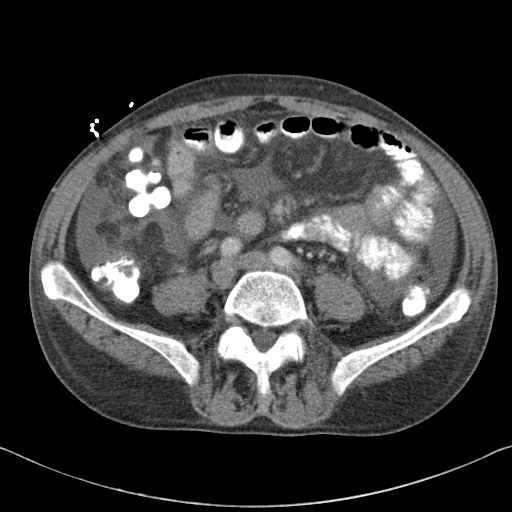
[im 51/91  soft-tissue]
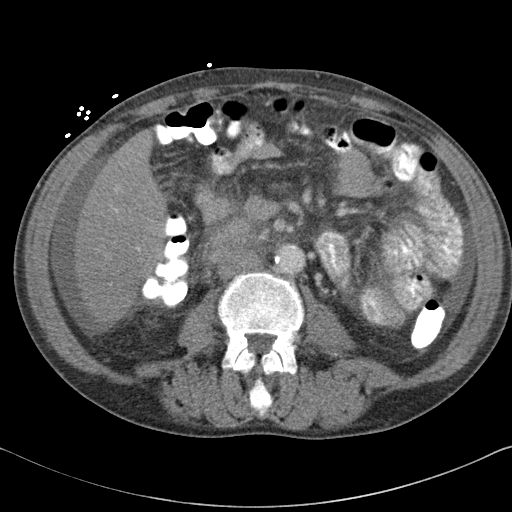
[im 57/91  soft-tissue]
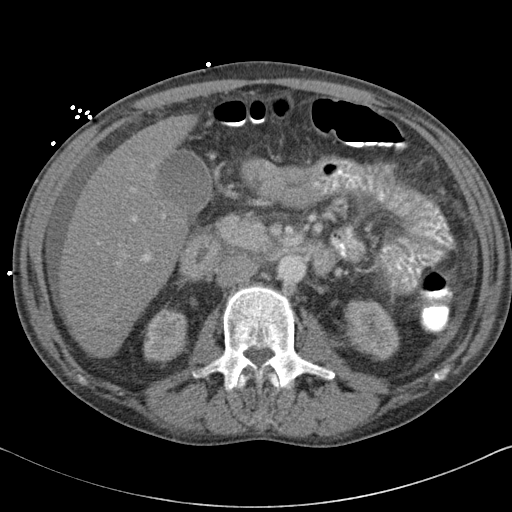
[im 57/91  bone]
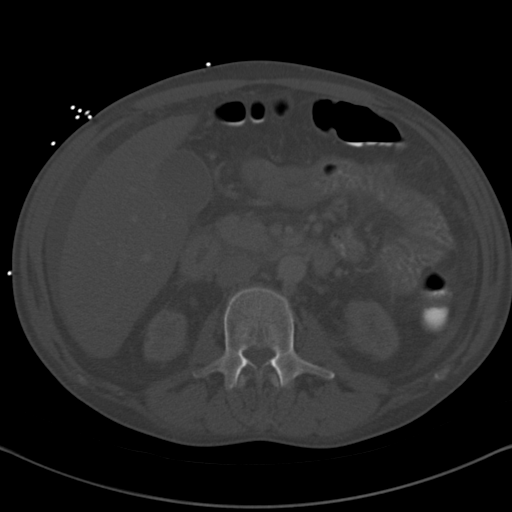
[im 62/91  soft-tissue]
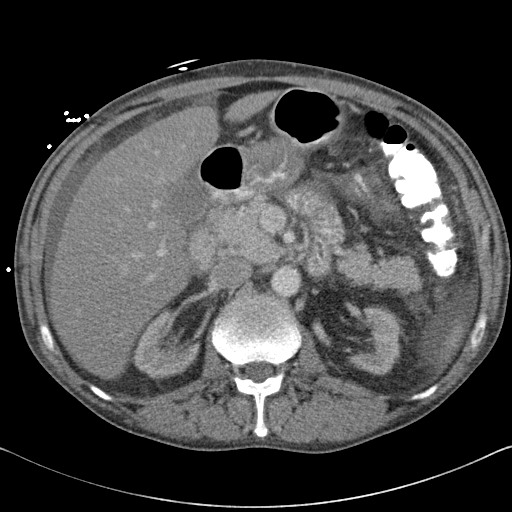
[im 68/91  soft-tissue]
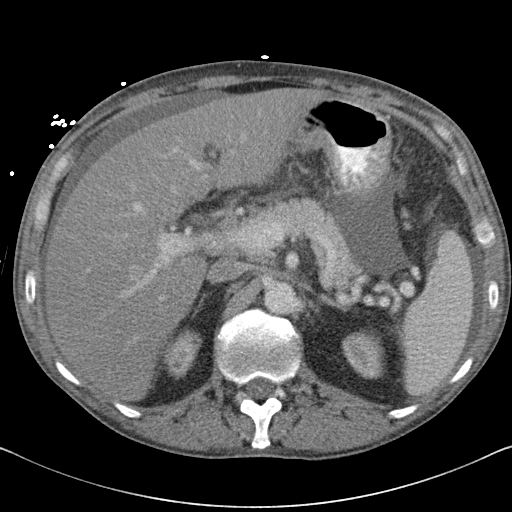
[im 74/91  soft-tissue]
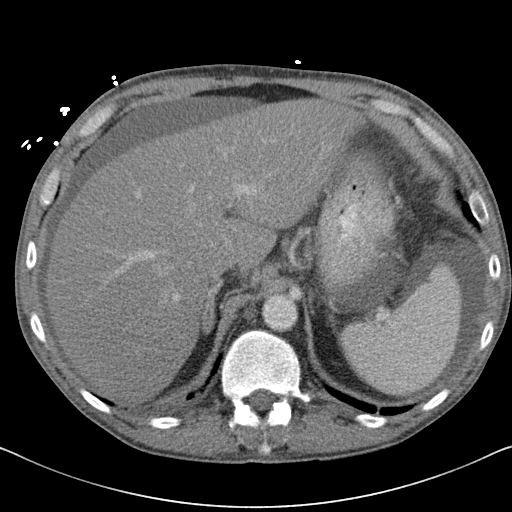
[im 79/91  soft-tissue]
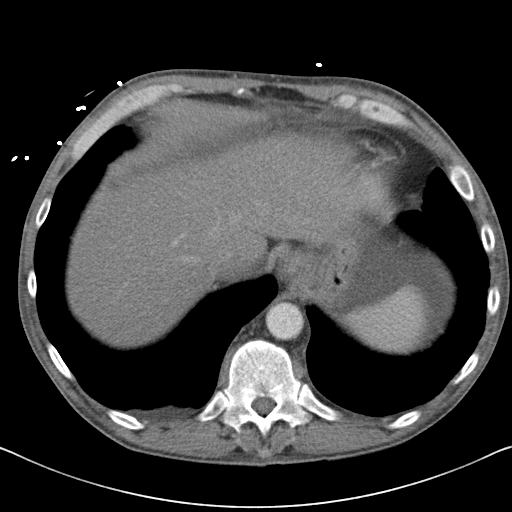
[im 85/91  soft-tissue]
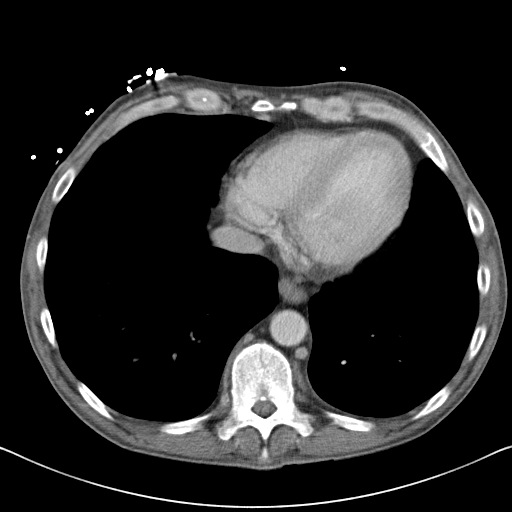

[Series 602: cor · coronal · 0.89mm/px · 3 of 119 slices shown]
[im 40/119  soft-tissue]
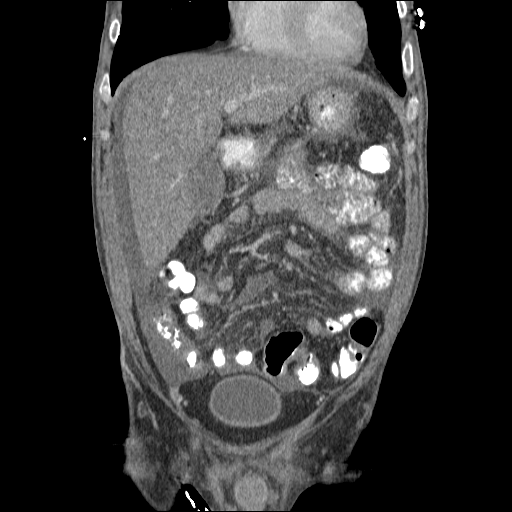
[im 53/119  soft-tissue]
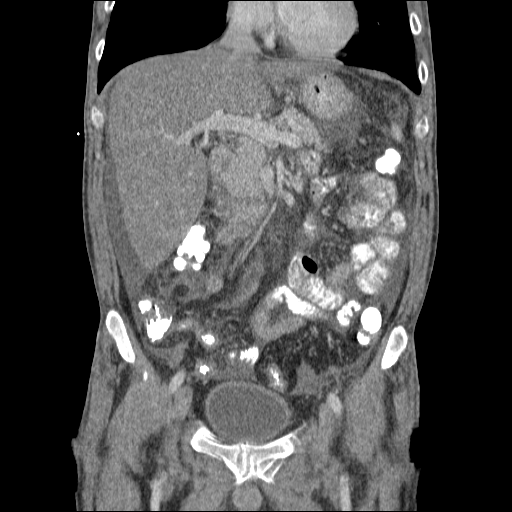
[im 66/119  soft-tissue]
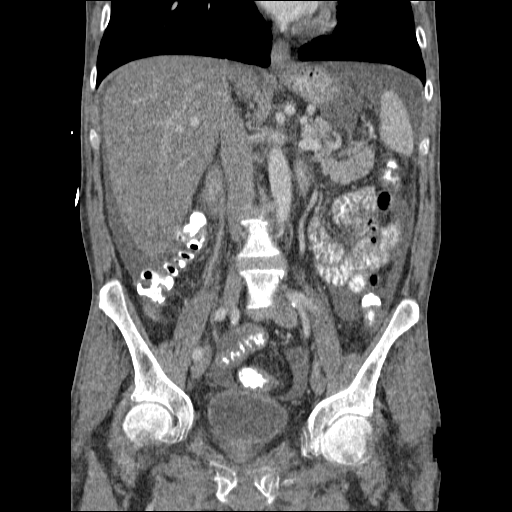

[17 of 46 positions shown; findings below may reference images not displayed]

FINDINGS: Trace right pleural effusion with right base atelectasis.
Heart is normal size.  No left effusion or focal airspace opacity.

Suspect mild fatty infiltration of the liver.  Small to moderate
free fluid in the abdomen and pelvis, predominately around the
liver and spleen.  This is new since prior study.  This is of
unknown etiology.  No focal lesion in the liver, spleen, pancreas,
adrenals or kidneys.  Kidneys appear slightly small.  No
hydronephrosis.  Gallbladder unremarkable.

Large and small bowel grossly unremarkable.  Aorta is normal
caliber.  No free air or adenopathy.  Urinary bladder is
unremarkable.  No acute bony abnormality.
IMPRESSION: New mild to moderate ascites of unknown etiology.  Question liver
disease.  Recommend correlation with LFTs.

Suspect mild fatty infiltration of the liver.

Trace right pleural effusions with right base atelectasis.

## 2011-08-07 ENCOUNTER — Other Ambulatory Visit: Payer: Self-pay | Admitting: Internal Medicine

## 2011-08-22 IMAGING — CR DG CHEST 2V
2 series · 2 of 2 positions shown · non-contrast
Comparison: 12/08/2010

CLINICAL DATA: Left chest pain and vomiting

CHEST - 2 VIEW

[w chest lat]
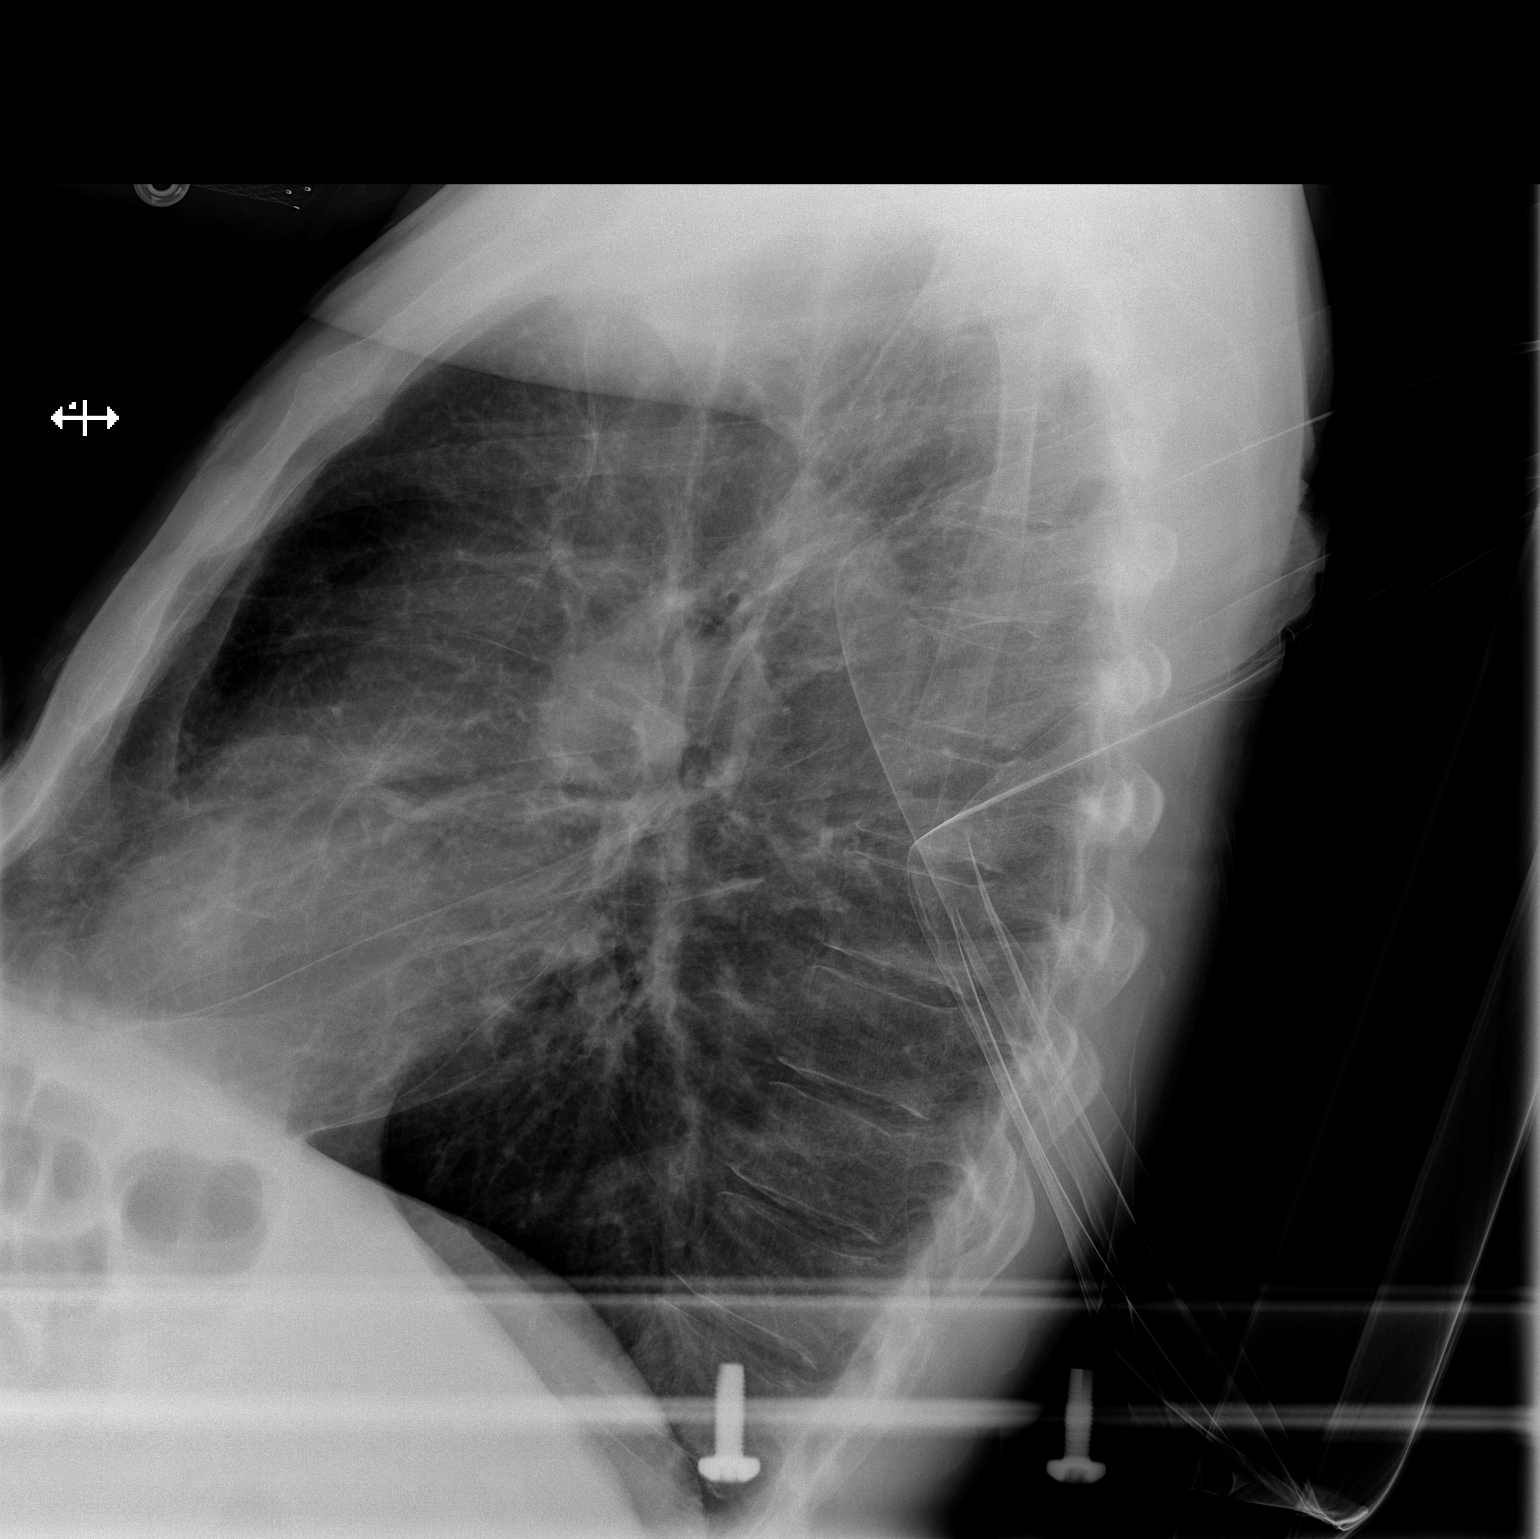

[x chest ap]
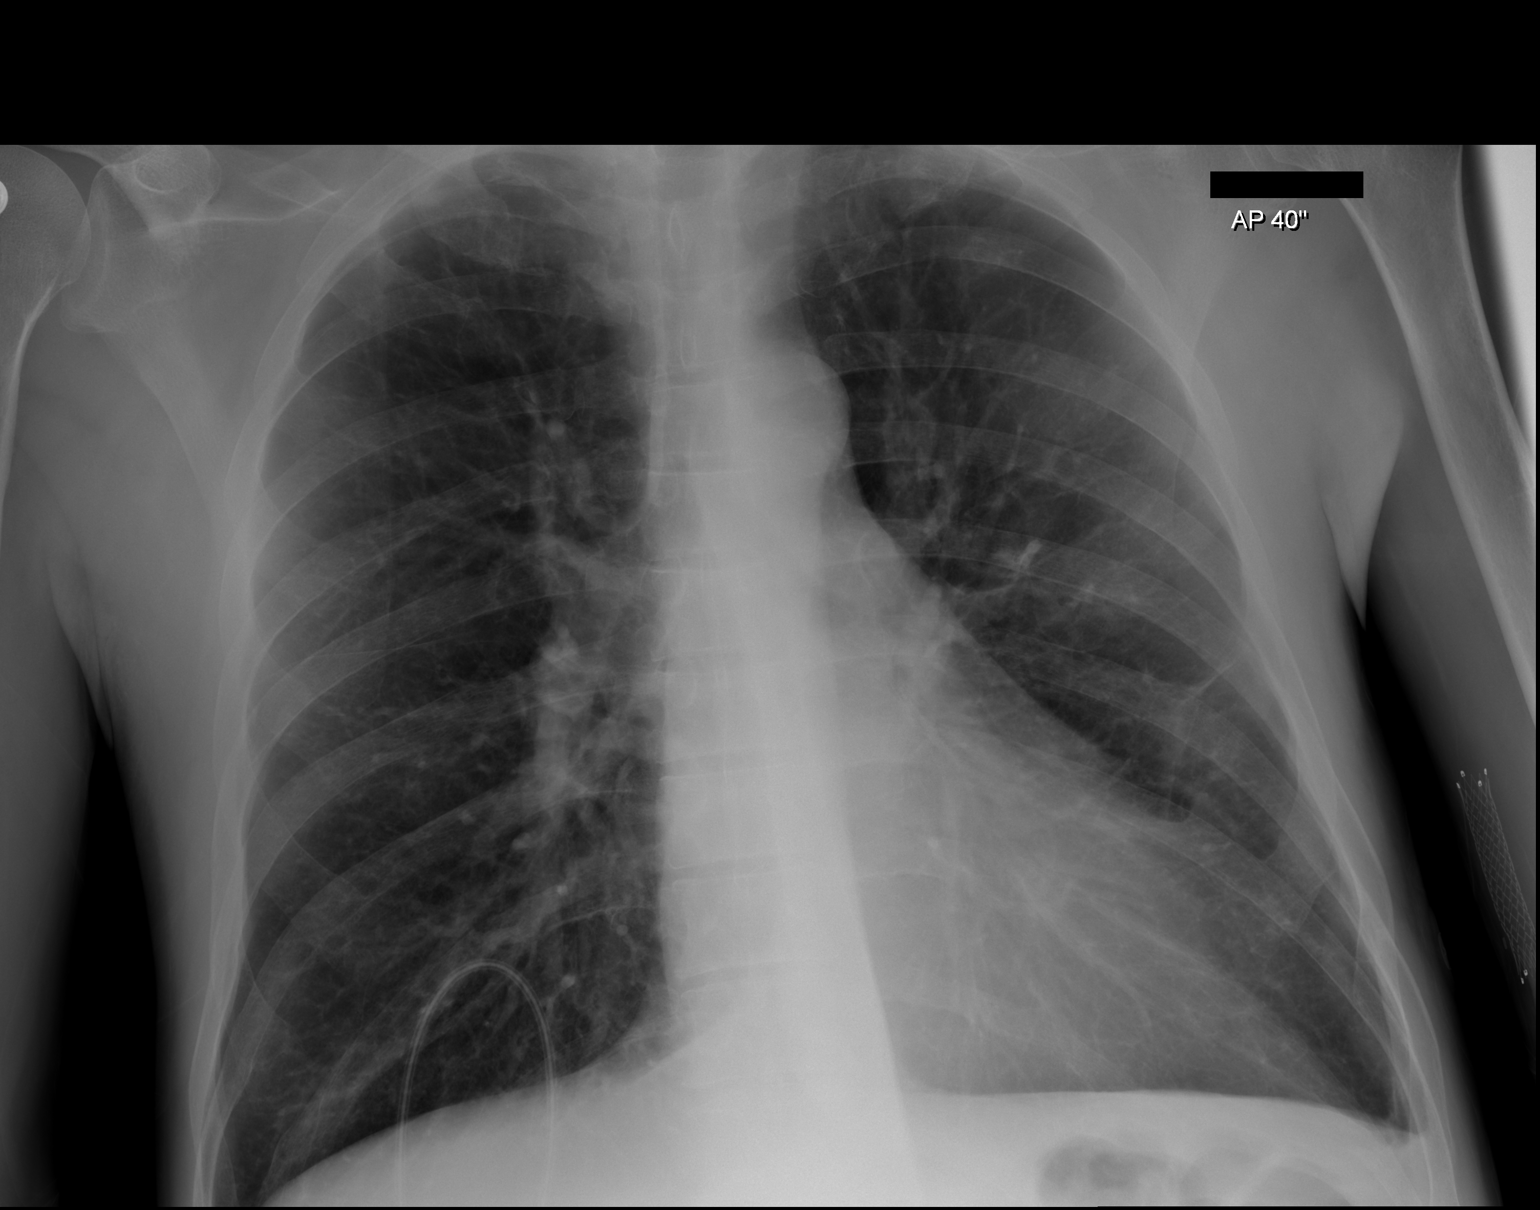

[2 of 2 positions shown; findings below may reference images not displayed]

FINDINGS: Emphysematous changes in the chest.  Scattered fibrosis.
Normal heart size and pulmonary vascularity.  No focal airspace
consolidation.  No blunting of costophrenic angles.  No
pneumothorax.  No significant changes since the previous study
other than interval removal of the central venous catheter.
IMPRESSION: Emphysematous changes.  No evidence of active pulmonary disease.

## 2011-09-03 ENCOUNTER — Ambulatory Visit
Admission: RE | Admit: 2011-09-03 | Discharge: 2011-09-03 | Disposition: A | Discharge status: Home / Self Care | Payer: Medicare Other | Source: Ambulatory Visit | Attending: Nephrology | Admitting: Nephrology

## 2011-09-03 ENCOUNTER — Other Ambulatory Visit: Payer: Self-pay | Admitting: Nephrology

## 2011-09-03 DIAGNOSIS — R05 Cough: Secondary | ICD-10-CM

## 2011-09-03 DIAGNOSIS — R0602 Shortness of breath: Secondary | ICD-10-CM

## 2011-09-05 ENCOUNTER — Other Ambulatory Visit (HOSPITAL_COMMUNITY): Payer: Self-pay | Admitting: Nephrology

## 2011-09-05 DIAGNOSIS — N186 End stage renal disease: Secondary | ICD-10-CM

## 2011-09-10 ENCOUNTER — Ambulatory Visit (HOSPITAL_COMMUNITY)
Admission: RE | Admit: 2011-09-10 | Discharge: 2011-09-10 | Disposition: A | Discharge status: Home / Self Care | Payer: Medicare Other | Source: Ambulatory Visit | Attending: Nephrology | Admitting: Nephrology

## 2011-09-10 DIAGNOSIS — Z4901 Encounter for fitting and adjustment of extracorporeal dialysis catheter: Secondary | ICD-10-CM | POA: Insufficient documentation

## 2011-09-10 DIAGNOSIS — N186 End stage renal disease: Secondary | ICD-10-CM | POA: Insufficient documentation

## 2011-09-10 MED ORDER — CHLORHEXIDINE GLUCONATE 4 % EX LIQD
CUTANEOUS | Status: AC
  Filled 2011-09-10: qty 30

## 2011-09-10 NOTE — Procedures (Signed)
RT IJ HD cath removal No comp Stable Functioning LUE AVG with thrill Full report dictated in PACS

## 2011-09-12 ENCOUNTER — Telehealth (HOSPITAL_COMMUNITY): Payer: Self-pay

## 2011-09-15 ENCOUNTER — Emergency Department (HOSPITAL_COMMUNITY): Payer: Medicare Other

## 2011-09-15 ENCOUNTER — Inpatient Hospital Stay (HOSPITAL_COMMUNITY)
Admission: EM | Admit: 2011-09-15 | Discharge: 2011-09-19 | DRG: 438 | Discharge status: Against Medical Advice | Payer: Medicare Other | Attending: Internal Medicine | Admitting: Internal Medicine

## 2011-09-15 ENCOUNTER — Encounter (HOSPITAL_COMMUNITY): Payer: Self-pay

## 2011-09-15 DIAGNOSIS — K859 Acute pancreatitis without necrosis or infection, unspecified: Principal | ICD-10-CM | POA: Diagnosis present

## 2011-09-15 DIAGNOSIS — F10239 Alcohol dependence with withdrawal, unspecified: Secondary | ICD-10-CM | POA: Diagnosis present

## 2011-09-15 DIAGNOSIS — K861 Other chronic pancreatitis: Secondary | ICD-10-CM

## 2011-09-15 DIAGNOSIS — B1920 Unspecified viral hepatitis C without hepatic coma: Secondary | ICD-10-CM | POA: Diagnosis present

## 2011-09-15 DIAGNOSIS — F1020 Alcohol dependence, uncomplicated: Secondary | ICD-10-CM | POA: Diagnosis present

## 2011-09-15 DIAGNOSIS — R0602 Shortness of breath: Secondary | ICD-10-CM

## 2011-09-15 DIAGNOSIS — D649 Anemia, unspecified: Secondary | ICD-10-CM | POA: Diagnosis present

## 2011-09-15 DIAGNOSIS — J449 Chronic obstructive pulmonary disease, unspecified: Secondary | ICD-10-CM | POA: Diagnosis present

## 2011-09-15 DIAGNOSIS — Z992 Dependence on renal dialysis: Secondary | ICD-10-CM

## 2011-09-15 DIAGNOSIS — N186 End stage renal disease: Secondary | ICD-10-CM

## 2011-09-15 DIAGNOSIS — F10939 Alcohol use, unspecified with withdrawal, unspecified: Secondary | ICD-10-CM | POA: Diagnosis present

## 2011-09-15 DIAGNOSIS — J4489 Other specified chronic obstructive pulmonary disease: Secondary | ICD-10-CM | POA: Diagnosis present

## 2011-09-15 DIAGNOSIS — F1010 Alcohol abuse, uncomplicated: Secondary | ICD-10-CM

## 2011-09-15 DIAGNOSIS — E8779 Other fluid overload: Secondary | ICD-10-CM | POA: Diagnosis present

## 2011-09-15 DIAGNOSIS — F17200 Nicotine dependence, unspecified, uncomplicated: Secondary | ICD-10-CM | POA: Diagnosis present

## 2011-09-15 DIAGNOSIS — Z79899 Other long term (current) drug therapy: Secondary | ICD-10-CM

## 2011-09-15 DIAGNOSIS — I120 Hypertensive chronic kidney disease with stage 5 chronic kidney disease or end stage renal disease: Secondary | ICD-10-CM | POA: Diagnosis present

## 2011-09-15 LAB — COMPREHENSIVE METABOLIC PANEL
ALT: 29 U/L (ref 0–53)
AST: 30 U/L (ref 0–37)
Albumin: 3.2 g/dL — ABNORMAL LOW (ref 3.5–5.2)
Calcium: 8.8 mg/dL (ref 8.4–10.5)
GFR calc Af Amer: 7 mL/min — ABNORMAL LOW (ref >90)
Potassium: 5 mEq/L (ref 3.5–5.1)
Sodium: 133 mEq/L — ABNORMAL LOW (ref 135–145)
Total Protein: 7.8 g/dL (ref 6.0–8.3)

## 2011-09-15 LAB — DIFFERENTIAL
Basophils Absolute: 0 10*3/uL (ref 0.0–0.1)
Basophils Relative: 0 % (ref 0–1)
Eosinophils Absolute: 0.1 10*3/uL (ref 0.0–0.7)
Eosinophils Relative: 2 % (ref 0–5)
Neutrophils Relative %: 62 % (ref 43–77)

## 2011-09-15 LAB — CBC
MCH: 33.8 pg (ref 26.0–34.0)
MCV: 100.3 fL — ABNORMAL HIGH (ref 78.0–100.0)
Platelets: 189 10*3/uL (ref 150–400)
RDW: 16.6 % — ABNORMAL HIGH (ref 11.5–15.5)

## 2011-09-15 LAB — PRO B NATRIURETIC PEPTIDE: Pro B Natriuretic peptide (BNP): 34172 pg/mL — ABNORMAL HIGH (ref 0–125)

## 2011-09-15 LAB — LIPASE, BLOOD: Lipase: 87 U/L — ABNORMAL HIGH (ref 11–59)

## 2011-09-15 LAB — LACTIC ACID, PLASMA: Lactic Acid, Venous: 1.5 mmol/L (ref 0.5–2.2)

## 2011-09-15 LAB — POCT I-STAT 3, VENOUS BLOOD GAS (G3P V)
O2 Saturation: 91 %
pCO2, Ven: 35.1 mmHg — ABNORMAL LOW (ref 45.0–50.0)
pH, Ven: 7.433 — ABNORMAL HIGH (ref 7.250–7.300)

## 2011-09-15 LAB — POCT I-STAT TROPONIN I

## 2011-09-15 MED ORDER — LORAZEPAM 2 MG/ML IJ SOLN
1.0000 mg | Freq: Once | INTRAMUSCULAR | Status: AC
  Administered 2011-09-15: 1 mg via INTRAVENOUS

## 2011-09-15 MED ORDER — MORPHINE SULFATE 4 MG/ML IJ SOLN
4.0000 mg | Freq: Once | INTRAMUSCULAR | Status: AC
  Administered 2011-09-15: 4 mg via INTRAVENOUS
  Filled 2011-09-15: qty 1

## 2011-09-15 MED ORDER — FUROSEMIDE 10 MG/ML IJ SOLN
40.0000 mg | Freq: Once | INTRAMUSCULAR | Status: AC
  Administered 2011-09-15: 40 mg via INTRAVENOUS
  Filled 2011-09-15: qty 4

## 2011-09-15 MED ORDER — LORAZEPAM 2 MG/ML IJ SOLN
1.0000 mg | Freq: Once | INTRAMUSCULAR | Status: AC
  Administered 2011-09-15: 1 mg via INTRAVENOUS
  Filled 2011-09-15: qty 1

## 2011-09-15 MED ORDER — FUROSEMIDE 10 MG/ML IJ SOLN: 40.0000 mg | Freq: Once | INTRAMUSCULAR | Status: DC

## 2011-09-15 MED ORDER — LORAZEPAM 2 MG/ML IJ SOLN
INTRAMUSCULAR | Status: AC
  Filled 2011-09-15: qty 1

## 2011-09-15 MED ORDER — NITROGLYCERIN 0.4 MG SL SUBL: 0.4000 mg | SUBLINGUAL_TABLET | SUBLINGUAL | Status: DC | PRN

## 2011-09-15 NOTE — ED Notes (Signed)
Pt continues to appear anxious intermittently and appears to intentionally hyperventilate with numerous support from staff. Dr Alto Denver at bedside and aware. No further orders received. Will continue to monitor.

## 2011-09-15 NOTE — ED Notes (Signed)
Patient having some anxiety and hyperventilation. Dr Alto Denver aware and patient medicated for same.

## 2011-09-15 NOTE — ED Notes (Signed)
Pt appears more calm at this time. Resting with eyes closed. Equal rise and fall of chest noted. NSR no monitor.

## 2011-09-15 NOTE — ED Notes (Signed)
Pt resting calmly with eyes closed at this time. Resp are unlabored and O2 sats 95% on 4L [place].

## 2011-09-15 NOTE — ED Notes (Signed)
Pt transported to floor stable and in no acute distress via monitor with RN.

## 2011-09-15 NOTE — ED Notes (Signed)
Pt via ems to tx rm with c/o pain to old dialysis site to rt upper chest/neck area.
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
## 2011-09-15 NOTE — ED Notes (Signed)
Nurse unable to take report at this time.

## 2011-09-16 ENCOUNTER — Inpatient Hospital Stay (HOSPITAL_COMMUNITY): Payer: Medicare Other

## 2011-09-16 ENCOUNTER — Other Ambulatory Visit: Payer: Self-pay

## 2011-09-16 DIAGNOSIS — K861 Other chronic pancreatitis: Secondary | ICD-10-CM

## 2011-09-16 DIAGNOSIS — F1010 Alcohol abuse, uncomplicated: Secondary | ICD-10-CM

## 2011-09-16 DIAGNOSIS — N186 End stage renal disease: Secondary | ICD-10-CM

## 2011-09-16 LAB — CREATININE, SERUM: GFR calc Af Amer: 7 mL/min — ABNORMAL LOW (ref >90)

## 2011-09-16 LAB — CBC
HCT: 35.3 % — ABNORMAL LOW (ref 39.0–52.0)
MCV: 101.4 fL — ABNORMAL HIGH (ref 78.0–100.0)
RBC: 3.48 MIL/uL — ABNORMAL LOW (ref 4.22–5.81)
RDW: 16.8 % — ABNORMAL HIGH (ref 11.5–15.5)
WBC: 8.2 10*3/uL (ref 4.0–10.5)

## 2011-09-16 LAB — CARDIAC PANEL(CRET KIN+CKTOT+MB+TROPI)
Relative Index: 2.7 — ABNORMAL HIGH (ref 0.0–2.5)
Relative Index: 3.3 — ABNORMAL HIGH (ref 0.0–2.5)
Total CK: 148 U/L (ref 7–232)
Troponin I: <0.3 ng/mL (ref <0.30)
Troponin I: <0.3 ng/mL (ref <0.30)

## 2011-09-16 LAB — HEPATITIS B SURFACE ANTIGEN: Hepatitis B Surface Ag: NEGATIVE

## 2011-09-16 MED ORDER — NICOTINE 14 MG/24HR TD PT24
14.0000 mg | MEDICATED_PATCH | Freq: Every day | TRANSDERMAL | Status: DC
  Administered 2011-09-16 – 2011-09-19 (×4): 14 mg via TRANSDERMAL
  Filled 2011-09-16 (×5): qty 1

## 2011-09-16 MED ORDER — THIAMINE HCL 100 MG/ML IJ SOLN
100.0000 mg | Freq: Every day | INTRAMUSCULAR | Status: DC
  Filled 2011-09-16 (×3): qty 1

## 2011-09-16 MED ORDER — HEPARIN SODIUM (PORCINE) 5000 UNIT/ML IJ SOLN
5000.0000 [IU] | Freq: Three times a day (TID) | INTRAMUSCULAR | Status: DC
  Administered 2011-09-16 – 2011-09-19 (×8): 5000 [IU] via SUBCUTANEOUS
  Filled 2011-09-16 (×12): qty 1

## 2011-09-16 MED ORDER — LORAZEPAM 1 MG PO TABS
0.0000 mg | ORAL_TABLET | Freq: Four times a day (QID) | ORAL | Status: DC
  Administered 2011-09-16: 1 mg via ORAL
  Filled 2011-09-16: qty 1

## 2011-09-16 MED ORDER — ACETAMINOPHEN 325 MG PO TABS
650.0000 mg | ORAL_TABLET | Freq: Four times a day (QID) | ORAL | Status: DC | PRN
  Administered 2011-09-16 – 2011-09-19 (×3): 650 mg via ORAL
  Filled 2011-09-16 (×3): qty 2

## 2011-09-16 MED ORDER — HEPARIN SODIUM (PORCINE) 1000 UNIT/ML IJ SOLN
1500.0000 [IU] | Freq: Once | INTRAMUSCULAR | Status: AC
  Administered 2011-09-16: 1500 [IU] via INTRAVENOUS

## 2011-09-16 MED ORDER — ADULT MULTIVITAMIN W/MINERALS CH: 1.0000 | ORAL_TABLET | Freq: Every day | ORAL | Status: DC

## 2011-09-16 MED ORDER — CALCIUM ACETATE 667 MG PO CAPS
667.0000 mg | ORAL_CAPSULE | Freq: Three times a day (TID) | ORAL | Status: DC
  Administered 2011-09-16 – 2011-09-19 (×9): 667 mg via ORAL
  Filled 2011-09-16 (×13): qty 1

## 2011-09-16 MED ORDER — PARICALCITOL 5 MCG/ML IV SOLN
6.0000 ug | INTRAVENOUS | Status: DC
  Administered 2011-09-16 – 2011-09-18 (×2): 6 ug via INTRAVENOUS
  Filled 2011-09-16 (×3): qty 1.2

## 2011-09-16 MED ORDER — RENA-VITE PO TABS
1.0000 | ORAL_TABLET | Freq: Every day | ORAL | Status: DC
  Administered 2011-09-16 – 2011-09-18 (×3): 1 via ORAL
  Filled 2011-09-16 (×7): qty 1

## 2011-09-16 MED ORDER — SODIUM CHLORIDE 0.9 % IV SOLN
125.0000 mg | INTRAVENOUS | Status: DC
  Administered 2011-09-16: 125 mg via INTRAVENOUS
  Filled 2011-09-16 (×2): qty 10

## 2011-09-16 MED ORDER — TRAMADOL HCL 50 MG PO TABS
50.0000 mg | ORAL_TABLET | Freq: Four times a day (QID) | ORAL | Status: AC | PRN
  Administered 2011-09-16 (×2): 50 mg via ORAL
  Filled 2011-09-16 (×2): qty 1

## 2011-09-16 MED ORDER — LORAZEPAM 1 MG PO TABS: 1.0000 mg | ORAL_TABLET | Freq: Once | ORAL | Status: DC

## 2011-09-16 MED ORDER — VITAMIN B-1 100 MG PO TABS
100.0000 mg | ORAL_TABLET | Freq: Every day | ORAL | Status: DC
  Administered 2011-09-16 – 2011-09-19 (×4): 100 mg via ORAL
  Filled 2011-09-16 (×5): qty 1

## 2011-09-16 MED ORDER — ALBUTEROL SULFATE (5 MG/ML) 0.5% IN NEBU
2.5000 mg | INHALATION_SOLUTION | RESPIRATORY_TRACT | Status: DC | PRN
  Administered 2011-09-16 – 2011-09-19 (×3): 2.5 mg via RESPIRATORY_TRACT
  Filled 2011-09-16 (×3): qty 0.5

## 2011-09-16 MED ORDER — PANCRELIPASE (LIP-PROT-AMYL) 12000-38000 UNITS PO CPEP
1.0000 | ORAL_CAPSULE | Freq: Three times a day (TID) | ORAL | Status: DC
  Administered 2011-09-16 – 2011-09-19 (×10): 1 via ORAL
  Filled 2011-09-16 (×16): qty 1

## 2011-09-16 MED ORDER — SODIUM CHLORIDE 0.9 % IJ SOLN: 3.0000 mL | INTRAMUSCULAR | Status: DC | PRN

## 2011-09-16 MED ORDER — ACETAMINOPHEN 650 MG RE SUPP
650.0000 mg | Freq: Four times a day (QID) | RECTAL | Status: DC | PRN
  Filled 2011-09-16: qty 1

## 2011-09-16 MED ORDER — CLONIDINE HCL 0.3 MG PO TABS
0.3000 mg | ORAL_TABLET | Freq: Two times a day (BID) | ORAL | Status: DC
  Administered 2011-09-16 – 2011-09-19 (×8): 0.3 mg via ORAL
  Filled 2011-09-16 (×13): qty 1

## 2011-09-16 MED ORDER — ONDANSETRON HCL 4 MG/2ML IJ SOLN: 4.0000 mg | Freq: Four times a day (QID) | INTRAMUSCULAR | Status: DC | PRN

## 2011-09-16 MED ORDER — LORAZEPAM 1 MG PO TABS
0.0000 mg | ORAL_TABLET | ORAL | Status: DC
  Administered 2011-09-16 (×2): 2 mg via ORAL
  Administered 2011-09-17 (×2): 1 mg via ORAL
  Administered 2011-09-17 (×2): 2 mg via ORAL
  Administered 2011-09-17: 1 mg via ORAL
  Administered 2011-09-18 (×2): 2 mg via ORAL
  Filled 2011-09-16: qty 2
  Filled 2011-09-16: qty 3
  Filled 2011-09-16: qty 2
  Filled 2011-09-16: qty 1
  Filled 2011-09-16 (×2): qty 2
  Filled 2011-09-16: qty 3
  Filled 2011-09-16: qty 4
  Filled 2011-09-16: qty 1
  Filled 2011-09-16: qty 2

## 2011-09-16 MED ORDER — LORAZEPAM 1 MG PO TABS
1.0000 mg | ORAL_TABLET | Freq: Once | ORAL | Status: AC
  Administered 2011-09-19: 1 mg via ORAL
  Filled 2011-09-16: qty 1
  Filled 2011-09-16: qty 2

## 2011-09-16 MED ORDER — LORAZEPAM 2 MG/ML IJ SOLN
1.0000 mg | Freq: Four times a day (QID) | INTRAMUSCULAR | Status: AC | PRN
  Filled 2011-09-16 (×4): qty 1

## 2011-09-16 MED ORDER — ONDANSETRON HCL 4 MG PO TABS: 4.0000 mg | ORAL_TABLET | Freq: Four times a day (QID) | ORAL | Status: DC | PRN

## 2011-09-16 MED ORDER — TIOTROPIUM BROMIDE MONOHYDRATE 18 MCG IN CAPS
18.0000 ug | ORAL_CAPSULE | Freq: Every day | RESPIRATORY_TRACT | Status: DC
Start: 2011-09-16 — End: 2011-09-19
  Administered 2011-09-17 – 2011-09-19 (×3): 18 ug via RESPIRATORY_TRACT
  Filled 2011-09-16: qty 5

## 2011-09-16 MED ORDER — FOLIC ACID 1 MG PO TABS
1.0000 mg | ORAL_TABLET | Freq: Every day | ORAL | Status: DC
  Administered 2011-09-16 – 2011-09-19 (×4): 1 mg via ORAL
  Filled 2011-09-16 (×5): qty 1

## 2011-09-16 MED ORDER — LORAZEPAM 1 MG PO TABS: 0.0000 mg | ORAL_TABLET | Freq: Two times a day (BID) | ORAL | Status: DC

## 2011-09-16 MED ORDER — SODIUM CHLORIDE 0.9 % IJ SOLN
3.0000 mL | Freq: Two times a day (BID) | INTRAMUSCULAR | Status: DC
  Administered 2011-09-16 – 2011-09-18 (×4): 3 mL via INTRAVENOUS

## 2011-09-16 MED ORDER — SODIUM CHLORIDE 0.9 % IV SOLN: 250.0000 mL | INTRAVENOUS | Status: DC | PRN

## 2011-09-16 MED ORDER — LORAZEPAM 2 MG/ML IJ SOLN
1.0000 mg | Freq: Four times a day (QID) | INTRAMUSCULAR | Status: DC
  Administered 2011-09-17 – 2011-09-19 (×7): 1 mg via INTRAVENOUS
  Filled 2011-09-16 (×4): qty 1

## 2011-09-16 MED ORDER — TRAMADOL HCL 50 MG PO TABS
50.0000 mg | ORAL_TABLET | Freq: Once | ORAL | Status: AC
  Administered 2011-09-16: 50 mg via ORAL
  Filled 2011-09-16: qty 1

## 2011-09-16 MED ORDER — PANTOPRAZOLE SODIUM 40 MG PO TBEC
40.0000 mg | DELAYED_RELEASE_TABLET | Freq: Every day | ORAL | Status: DC
  Administered 2011-09-16 – 2011-09-19 (×4): 40 mg via ORAL
  Filled 2011-09-16 (×4): qty 1

## 2011-09-16 MED ORDER — DARBEPOETIN ALFA-POLYSORBATE 40 MCG/0.4ML IJ SOLN
40.0000 ug | INTRAMUSCULAR | Status: DC
  Filled 2011-09-16: qty 0.4

## 2011-09-16 MED ORDER — LORAZEPAM 1 MG PO TABS
1.0000 mg | ORAL_TABLET | Freq: Four times a day (QID) | ORAL | Status: AC | PRN
  Filled 2011-09-16: qty 1

## 2011-09-16 NOTE — Progress Notes (Signed)
Subjective: He reports continued abd pain.  He just spoke with renal team member.  When asked about his tremulousness, pressured speech, and agitation he agrees that he is withdrawing from alcohol.  Reiterated that he drink one fifth of liquor at baseline each day.   Objective: Vital signs in last 24 hours: Filed Vitals:   09/16/11 0245 09/16/11 0248 09/16/11 0514 09/16/11 0930  BP: 181/93  173/112 138/74  Pulse: 86  89 88  Temp:  100.1 F (37.8 C) 100 F (37.8 C)   TempSrc:   Oral   Resp:   23   Height:      Weight:      SpO2: 91%  90%    Physical Exam:   General: Agitated, tremulous, cooperative with exam.  In mild-moderate distress. Head: normocephalic and atraumatic.  Neck: no JVD, and no carotid bruits. Bruising right side neck. Lungs: normal respiratory effort, no accessory muscle use, coarse breath sounds bilaterally. Expiratory wheezing noted. Bibasilar lung sounds a few crackles. Heart: normal rate, regular rhythm, no murmur, no gallop, and no rub.   Abdomen: tender in epigastric area, RUQ, RLQ. normal bowel sounds, no distention. Pulses: 2+ DP/PT pulses bilaterally Neurologic: alert & oriented X3, cranial nerves II-XII intact, strength normal in all extremities.  Diffusely very tremulous.   Skin: turgor normal and no rashes.     Lab Results: Basic Metabolic Panel:  Lab 09/16/11 5621 09/15/11 1835  NA -- 133*  K -- 5.0  CL -- 92*  CO2 -- 23  GLUCOSE -- 87  BUN -- 50*  CREATININE 9.19* 8.70*  CALCIUM -- 8.8  MG -- --  PHOS -- --   Liver Function Tests:  Lab 09/15/11 1835  AST 30  ALT 29  ALKPHOS 79  BILITOT 0.2*  PROT 7.8  ALBUMIN 3.2*    Lab 09/15/11 1835  LIPASE 87*  AMYLASE --   CBC:  Lab 09/16/11 0159 09/15/11 1835  WBC 8.2 5.2  NEUTROABS -- 3.2  HGB 11.6* 11.3*  HCT 35.3* 33.5*  MCV 101.4* 100.3*  PLT 184 189   Cardiac Enzymes:  Lab 09/16/11 0945 09/16/11 0158  CKTOTAL 128 148  CKMB 3.7 4.0  CKMBINDEX -- --  TROPONINI <0.30  <0.30   BNP:  Lab 09/15/11 1836  PROBNP 34172.0*   Coagulation:  Lab 09/15/11 1835  LABPROT 12.6  INR 0.92    Micro Results: Recent Results (from the past 240 hour(s))  MRSA PCR SCREENING     Status: Normal   Collection Time   09/16/11  2:18 AM      Component Value Range Status Comment   MRSA by PCR NEGATIVE  NEGATIVE  Final    Studies/Results: Dg Chest Port 1v Same Day  09/15/2011  *RADIOLOGY REPORT*  Clinical Data: The respiratory distress.  Right neck pain following removal of dialysis catheter.  PORTABLE CHEST - 1 VIEW SAME DAY  Comparison: Two-view chest x-ray 09/03/2011.  Findings: The heart is mildly enlarged.  Mild pulmonary vascular congestion is superimposed on baseline of COPD.  Mild bibasilar atelectasis is present.  IMPRESSION:  1.  Borderline cardiomegaly and mild pulmonary vascular congestion.  Original Report Authenticated By: Jamesetta Orleans. MATTERN, M.D.   Medications: I have reviewed the patient's current medications. Scheduled Meds:   . calcium acetate  667 mg Oral TID WC  . cloNIDine  0.3 mg Oral BID  . folic acid  1 mg Oral Daily  . furosemide  40 mg Intravenous Once  . heparin  5,000 Units Subcutaneous Q8H  . lipase/protease/amylase  1 capsule Oral TID AC  . LORazepam      . LORazepam  1 mg Intravenous Once  . LORazepam  1 mg Intravenous Once  . LORazepam  0-4 mg Oral Q6H   Followed by  . LORazepam  0-4 mg Oral Q12H  . LORazepam  1 mg Oral Once  . LORazepam  1 mg Oral Once  .  morphine injection  4 mg Intravenous Once  . multivitamin  1 tablet Oral QHS  . nicotine  14 mg Transdermal Q0600  . pantoprazole  40 mg Oral Daily  . sodium chloride  3 mL Intravenous Q12H  . thiamine  100 mg Oral Daily   Or  . thiamine  100 mg Intravenous Daily  . tiotropium  18 mcg Inhalation Daily  . traMADol  50 mg Oral Once  . DISCONTD: furosemide  40 mg Intravenous Once  . DISCONTD: mulitivitamin with minerals  1 tablet Oral Daily   Continuous Infusions:  PRN  Meds:.sodium chloride, acetaminophen, acetaminophen, albuterol, LORazepam, LORazepam, ondansetron (ZOFRAN) IV, ondansetron, sodium chloride, traMADol, DISCONTD: nitroGLYCERIN    Assessment/Plan:  # shortness of breath, likely due to fluid overload  Patient had full HD on 09/13/11 and due for HD today.  I spoke with renal team and he is to get HD this afternoon. He presents with acute onset shortness breath associate with mild wheezing. Bilateral crackles and wheezing noted. afebrile and no leukocytosis. Chest x-ray indicates borderline cardiomegaly and mild pulmonary vascular congestion. No recent sick contact or sickness. The influenza or PNA is less likely . The etiology of SOB is likely due to fluid overload.  The likelihood of PE is low. Geneva score is 3 which indicates low probability of PE. Denies recent travel. No s/s lower Ext. He desats when he removes his nasal cannula and moves around in bed. Plan  - monitor O2 Sat and respiratory status, continue supplemental O2   # ESRD on HD. MWF schedule  - renal following, getting HD today   # Abdominal Pain  This has been chronic issue in the past. Patient does have history of chronic recurrent alcoholic pancreatitis and hepatitis C. He was evaluated during admission in December 2012 with no significant abnormality noted by KUB and abdominal ultrasound. His abdominal tenderness is inconsistent during the assessment with stethoscope vs manual palpation. His lipase is mildly elevated at 87, which may a/c ESRD.  Plan  - Keep NPO  -Tylenol, use narcotics sparingly as to not complicate his alcohol withdrawal -resume Creon-pancrease oral Tx once diet advanced.    # Alcohol withdrawal: Patient says he drinks one fifth per day.  Currently appears clinically to be withdrawing due to tremulousness/agitation/pressured speech.  HRs ok but BPs are high.   Plan  -CIWA protocol -Continue clonidine  # COPD, he was found to have a mild wheezing on  admission which is likely his baseline.  - continue home inhaler and neb.   # VTE: Heparin        LOS: 1 day   Yaakov Guthrie, BRAD 09/16/2011, 2:01 PM

## 2011-09-16 NOTE — Progress Notes (Signed)
Patient observed sleeping earlier around 2350. Md currently assessing patient. Admission orders pending.

## 2011-09-16 NOTE — Progress Notes (Signed)
Call MD about pt's pain. Tramadol order. Waiting for pharmacy to send BP med. Pt will transfer to another floor because telemetry is not ordered. Will monitor until transfer.

## 2011-09-16 NOTE — Progress Notes (Signed)
Utilization review complete 

## 2011-09-16 NOTE — H&P (Signed)
Internal Medicine Attending Admission Note Date: 09/16/2011  Patient name: EMERSON SCHREIFELS Medical record number: [id number] Date of birth: October 29, 1955 Age: 56 y.o. Gender: male  I saw and evaluated the patient. I reviewed the resident's note and I agree with the resident's findings and plan as documented in the resident's note.  Chief Complaint(s):  Abdominal pain and shortness of breath  History - key components related to admission:  Mr. Pantano is well known to the internal medicine teaching service. He presents with the acute onset of severe abdominal pain with no precipitants or relievers. He also notes progressive shortness of breath. Although he initially denied drinking alcohol, this history is consistent with our previous experiences with Mr. Bordonaro and he eventually admitted to having drank up to 2 days prior to admission. Other than some right neck pain where an internal central venous catheter was recently removed he is without new complaints. Of note the abdominal pain is dull and without radiation.  Physical Exam - key components related to admission:  Filed Vitals:   09/16/11 0245 09/16/11 0248 09/16/11 0514 09/16/11 0930  BP: 181/93  173/112 138/74  Pulse: 86  89 88  Temp:  100.1 F (37.8 C) 100 F (37.8 C)   TempSrc:   Oral   Resp:   23   Height:      Weight:      SpO2: 91%  90%    General: Well-developed well-nourished man sitting in bed jittery but conversant. HEENT: A small hematoma and bruising over the right clavicle. Lungs: bibasilar inspiratory crackles without wheezes or rhonchi. Heart: Tachycardic regular rate and rhythm without murmurs. Abdomen: When distracted it was soft, non tender, active bowel sounds, without masses or hepatosplenomegaly. There was no rebound. Extremities: Without edema.  Lab results:  Basic Metabolic Panel:  Basename 09/16/11 0159 09/15/11 1835  NA -- 133*  K -- 5.0  CL -- 92*  CO2 -- 23  GLUCOSE -- 87  BUN -- 50*  CREATININE  9.19* 8.70*  CALCIUM -- 8.8  MG -- --  PHOS -- --   Liver Function Tests:  [facility] 09/15/11 1835  AST 30  ALT 29  ALKPHOS 79  BILITOT 0.2*  PROT 7.8  ALBUMIN 3.2*    Basename 09/15/11 1835  LIPASE 87*  AMYLASE --   CBC:  Basename 09/16/11 0159 09/15/11 1835  WBC 8.2 5.2  NEUTROABS -- 3.2  HGB 11.6* 11.3*  HCT 35.3* 33.5*  MCV 101.4* 100.3*  PLT 184 189   Cardiac Enzymes:  Basename 09/16/11 0945 09/16/11 0158  CKTOTAL 128 148  CKMB 3.7 4.0  CKMBINDEX -- --  TROPONINI <0.30 <0.30   Coagulation:  Basename 09/15/11 1835  INR 0.92   Imaging results:  Dg Chest Port 1v Same Day  09/15/2011  *RADIOLOGY REPORT*  Clinical Data: The respiratory distress.  Right neck pain following removal of dialysis catheter.  PORTABLE CHEST - 1 VIEW SAME DAY  Comparison: Two-view chest x-ray 09/03/2011.  Findings: The heart is mildly enlarged.  Mild pulmonary vascular congestion is superimposed on baseline of COPD.  Mild bibasilar atelectasis is present.  IMPRESSION:  1.  Borderline cardiomegaly and mild pulmonary vascular congestion.  Original Report Authenticated By: Jamesetta Orleans. MATTERN, M.D.    Assessment & Plan by Problem:  Mr. Laufer is a 56 year old man with a history of chronic alcohol abuse and recurrent pancreatitis complicated by end-stage renal disease requiring hemodialysis who presents with the onset of abdominal pain, progressive dyspnea on exertion, and now  jitteriness and agitation. It is quite possible he has a mild case of acute on chronic pancreatitis leading to his abdominal pain. His exam, when distracted, is very benign. The shortness of breath is likely related to volume overload. Although it appears he has been compliant with his dialysis therapy, he does admit to some indiscretion with his fluid intake. Finally his agitation and jitteriness are now likely secondary to alcohol withdrawal given his recent illness limiting his alcohol intake.  1) Acute on chronic  pancreatitis:  We will keep Mr. Ramsburg n.p.o. And manage his abdominal pain with acetaminophen and tramadol given the risks associated with his polysubstance abuse.  2) Volume overload: Mr. Pang is scheduled to undergo hemodialysis and we will do so today. It is hoped that during this run his slight volume overload can be corrected.  3) Alcohol withdrawal: Wew ill aggressively treat Mr. Guinn's alcohol withdrawal with the CIWA protocol.  4) Disposition: It is hoped that once his volume overload is corrected, his acute on chronic pancreatitis calms down, and his alcohol withdrawal his treated we will discharge him back to his current living situation.
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
## 2011-09-16 NOTE — Progress Notes (Signed)
Pt up at nurses station c/o severe rt abd pain, pt tremulous, anxious. Escorted back to room, placed back to bed.  O2 sats 89 to 90 or ra, pulse 107. Pt very anxious.  In talking with patient, pt stated it had been 2 days since he had had any alcohol, states he drinks vodka daily but had run out of money for this.  Stated he was embarrassed to admit etoh use. Dr. Josem Kaufmann notified of above by Enzo Montgomery, RN and orders given . Ativan 1mg  IV given  At 0925.

## 2011-09-16 NOTE — Progress Notes (Signed)
MD notify regarding Pt's BP 173/112 and pain.  No intervention order for BP at this time. Will give Ultram 50 mg now per MD order.  Will continue monitor pt
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
## 2011-09-16 NOTE — Progress Notes (Signed)
Pt. Complaining of severe abd.pain. Dr. Vicente Masson was notified but no new orders were given. Pt. Signed AMA papers but then came up to nurses station shouting and anxious. Pt. Escorted to bed and Dr. Vicente Masson notified. 1mg  Ativan was ordered and given. Pt. Was also put on CIWA protocol. Pt. Now resting. Hansel Starling, RN

## 2011-09-17 DIAGNOSIS — E8779 Other fluid overload: Secondary | ICD-10-CM

## 2011-09-17 MED ORDER — LISINOPRIL 10 MG PO TABS
10.0000 mg | ORAL_TABLET | Freq: Every day | ORAL | Status: DC
  Administered 2011-09-17 – 2011-09-19 (×3): 10 mg via ORAL
  Filled 2011-09-17 (×3): qty 1

## 2011-09-17 MED ORDER — MORPHINE SULFATE 2 MG/ML IJ SOLN
1.0000 mg | INTRAMUSCULAR | Status: DC | PRN
  Administered 2011-09-17 – 2011-09-19 (×9): 2 mg via INTRAVENOUS
  Filled 2011-09-17 (×8): qty 1

## 2011-09-17 NOTE — Progress Notes (Signed)
Subjective:  Feels better, shaking a lot, but oriented.  Asking if he can have home oxygen.    Objective:    Vital signs in last 24 hours: Filed Vitals:   09/17/11 0527 09/17/11 0600 09/17/11 0807 09/17/11 0927  BP: 167/104 160/108  159/84  Pulse: 80 82  82  Temp: 98.3 F (36.8 C)     TempSrc:      Resp: 20     Height:      Weight:      SpO2: 91%  91%    Weight change: -2.743 kg (-6 lb 0.7 oz)  Intake/Output Summary (Last 24 hours) at 09/17/11 1153 Last data filed at 09/17/11 0500  Gross per 24 hour  Intake      0 ml  Output   5484 ml  Net  -5484 ml   Labs: Basic Metabolic Panel:  Lab 09/16/11 2130 09/15/11 1835  NA -- 133*  K -- 5.0  CL -- 92*  CO2 -- 23  GLUCOSE -- 87  BUN -- 50*  CREATININE 9.19* 8.70*  ALB -- --  CALCIUM -- 8.8  PHOS -- --   Liver Function Tests:  Lab 09/15/11 1835  AST 30  ALT 29  ALKPHOS 79  BILITOT 0.2*  PROT 7.8  ALBUMIN 3.2*    Lab 09/15/11 1835  LIPASE 87*  AMYLASE --   No results found for this basename: AMMONIA:3 in the last 168 hours CBC:  Lab 09/16/11 0159 09/15/11 1835  WBC 8.2 5.2  NEUTROABS -- 3.2  HGB 11.6* 11.3*  HCT 35.3* 33.5*  MCV 101.4* 100.3*  PLT 184 189   Cardiac Enzymes:  Lab 09/16/11 1708 09/16/11 0945 09/16/11 0158  CKTOTAL 114 128 148  CKMB 3.8 3.7 4.0  CKMBINDEX -- -- --  TROPONINI <0.30 <0.30 <0.30   CBG: No results found for this basename: GLUCAP:5 in the last 168 hours  Iron Studies: No results found for this basename: IRON:30,TIBC:30,SATURATION RATIOS:30,TRANSFERRIN:30,FERRITIN:30 in the last 168 hours Studies/Results: Dg Chest Port 1v Same Day  09/15/2011  *RADIOLOGY REPORT*  Clinical Data: The respiratory distress.  Right neck pain following removal of dialysis catheter.  PORTABLE CHEST - 1 VIEW SAME DAY  Comparison: Two-view chest x-ray 09/03/2011.  Findings: The heart is mildly enlarged.  Mild pulmonary vascular congestion is superimposed on baseline of COPD.  Mild bibasilar  atelectasis is present.  IMPRESSION:  1.  Borderline cardiomegaly and mild pulmonary vascular congestion.  Original Report Authenticated By: Jamesetta Orleans. MATTERN, M.D.   Medications:      . calcium acetate  667 mg Oral TID WC  . cloNIDine  0.3 mg Oral BID  . darbepoetin (ARANESP) injection - DIALYSIS  40 mcg Intravenous Q Wed-HD  . ferric gluconate (FERRLECIT/NULECIT) IV  125 mg Intravenous Weekly  . folic acid  1 mg Oral Daily  . heparin  1,500 Units Intravenous Once  . heparin  5,000 Units Subcutaneous Q8H  . lipase/protease/amylase  1 capsule Oral TID AC  . LORazepam  1 mg Intravenous Q6H  . LORazepam  0-4 mg Oral Q4H   Followed by  . LORazepam  0-4 mg Oral Q12H  . LORazepam  1 mg Oral Once  . LORazepam  1 mg Oral Once  . multivitamin  1 tablet Oral QHS  . nicotine  14 mg Transdermal Q0600  . pantoprazole  40 mg Oral Daily  . paricalcitol  6 mcg Intravenous Q M,W,F-1800  . sodium chloride  3 mL Intravenous Q12H  .  thiamine  100 mg Oral Daily   Or  . thiamine  100 mg Intravenous Daily  . tiotropium  18 mcg Inhalation Daily  . DISCONTD: LORazepam  0-4 mg Oral Q6H  . DISCONTD: LORazepam  0-4 mg Oral Q12H    I  have reviewed scheduled and prn medications.  Physical Exam:  Blood pressure 159/84, pulse 82, temperature 98.3 F (36.8 C), temperature source Oral, resp. rate 20, height 6' (1.829 m), weight 67.7 kg (149 lb 4 oz), SpO2 91.00%.  General:Alert. Anxious thin chronically ill white male, tremulous, oriented x 3  HEENT:[place], mm dry Eyes:,eomi  Neck:small hemotoma, erythema rt. Upper chest/clavicle  Heart:reg. tachy  Lungs:exp. Wheezes/crackles  Abdomen:bspos. Soft, tender epigastric area  Extremities:no pedal edema  Skin:warm ,dry  Neuro:ox3 anxious , jittery.no acute deficits  Dialysis Access:pos. Bruit left upper arm avgg.   Assessment/Plan:  1. Volume overload/pulm edema - resolved, euvolemic today. 2. Acute on chronic pancreatitis - per primary team 3 ESRD -  mwf usual schedule.  4. Hypertension/volume - clonidine at home dose.  Will add fosinopril 10 mg day, as at home per pt (not on home med list, though) 4. Anemia - epo and iron on hd  5. Metabolic bone disease - zemplar and binders, Phoslo  6. COPD - check room air SaO2 7. Etoh abuse/ withdrawal= continues to drink, + withdrawal symptoms. Per primary svc, getting ativan prn  Vinson Moselle  MD [facility] [phone] pgr    [phone] cell 09/17/2011, 11:53 AM

## 2011-09-17 NOTE — Progress Notes (Signed)
Subjective:  Feeling a bit better today but complains of RUQ/epigastric abdominal pain still.   Objective: Vital signs in last 24 hours: Filed Vitals:   09/17/11 0527 09/17/11 0600 09/17/11 0807 09/17/11 0927  BP: 167/104 160/108  159/84  Pulse: 80 82  82  Temp: 98.3 F (36.8 C)     TempSrc:      Resp: 20     Height:      Weight:      SpO2: 91%  91%    Weight change: -6 lb 0.7 oz (-2.743 kg)  Intake/Output Summary (Last 24 hours) at 09/17/11 1239 Last data filed at 09/17/11 9629  Gross per 24 hour  Intake      0 ml  Output   5484 ml  Net  -5484 ml   Physical Exam: General: Agitation and tremulousness remain but are improved from yesterday, cooperative with exam. In mild distress. Head: normocephalic and atraumatic.  Neck: no JVD, and no carotid bruits. Lungs: normal respiratory effort, no accessory muscle use, some coarse breath sounds scattered bilaterally. A few crackles in left mid-lower lung field.   Heart: normal rate, regular rhythm, no murmur, no gallop, and no rub.   Abdomen: tender in epigastric area, RUQ. normal bowel sounds, no distention.  Tenderness is partially distractible but not fully distractible. Pulses: 2+ DP/PT pulses bilaterally Neurologic: alert & oriented X3, cranial nerves II-XII intact, strength normal in all extremities. Diffusely tremulous.  Skin: turgor normal and no rashes.    Lab Results: Basic Metabolic Panel:  Lab 09/16/11 5284 09/15/11 1835  NA -- 133*  K -- 5.0  CL -- 92*  CO2 -- 23  GLUCOSE -- 87  BUN -- 50*  CREATININE 9.19* 8.70*  CALCIUM -- 8.8  MG -- --  PHOS -- --   Liver Function Tests:  Lab 09/15/11 1835  AST 30  ALT 29  ALKPHOS 79  BILITOT 0.2*  PROT 7.8  ALBUMIN 3.2*    Lab 09/15/11 1835  LIPASE 87*  AMYLASE --   CBC:  Lab 09/16/11 0159 09/15/11 1835  WBC 8.2 5.2  NEUTROABS -- 3.2  HGB 11.6* 11.3*  HCT 35.3* 33.5*  MCV 101.4* 100.3*  PLT 184 189   Cardiac Enzymes:  Lab 09/16/11 1708 09/16/11  0945 09/16/11 0158  CKTOTAL 114 128 148  CKMB 3.8 3.7 4.0  CKMBINDEX -- -- --  TROPONINI <0.30 <0.30 <0.30   BNP:  Lab 09/15/11 1836  PROBNP 34172.0*   Coagulation:  Lab 09/15/11 1835  LABPROT 12.6  INR 0.92   Urine Drug Screen: Drugs of Abuse     Component Value Date/Time   LABOPIA NEG 09/27/2010 1551   COCAINSCRNUR NEG 09/27/2010 1551   COCAINSCRNUR NONE DETECTED 09/03/2010 1852   LABBENZ NEG 09/27/2010 1551   LABBENZ POSITIVE* 09/03/2010 1852   AMPHETMU NEG 09/27/2010 1551   AMPHETMU NONE DETECTED 09/03/2010 1852   THCU NONE DETECTED 09/03/2010 1852   LABBARB  Value: NONE DETECTED        DRUG SCREEN FOR MEDICAL PURPOSES ONLY.  IF CONFIRMATION IS NEEDED FOR ANY PURPOSE, NOTIFY LAB WITHIN 5 DAYS.        LOWEST DETECTABLE LIMITS FOR URINE DRUG SCREEN Drug Class       Cutoff (ng/mL) Amphetamine      1000 Barbiturate      200 Benzodiazepine   200 Tricyclics       300 Opiates          300 Cocaine  300 THC              50 09/03/2010 1852      Micro Results: Recent Results (from the past 240 hour(s))  MRSA PCR SCREENING     Status: Normal   Collection Time   09/16/11  2:18 AM      Component Value Range Status Comment   MRSA by PCR NEGATIVE  NEGATIVE  Final    Studies/Results: Dg Chest Port 1v Same Day  09/15/2011  *RADIOLOGY REPORT*  Clinical Data: The respiratory distress.  Right neck pain following removal of dialysis catheter.  PORTABLE CHEST - 1 VIEW SAME DAY  Comparison: Two-view chest x-ray 09/03/2011.  Findings: The heart is mildly enlarged.  Mild pulmonary vascular congestion is superimposed on baseline of COPD.  Mild bibasilar atelectasis is present.  IMPRESSION:  1.  Borderline cardiomegaly and mild pulmonary vascular congestion.  Original Report Authenticated By: Jamesetta Orleans. MATTERN, M.D.   Medications: I have reviewed the patient's current medications. Scheduled Meds:   . calcium acetate  667 mg Oral TID WC  . cloNIDine  0.3 mg Oral BID  . darbepoetin  (ARANESP) injection - DIALYSIS  40 mcg Intravenous Q Wed-HD  . ferric gluconate (FERRLECIT/NULECIT) IV  125 mg Intravenous Weekly  . folic acid  1 mg Oral Daily  . heparin  1,500 Units Intravenous Once  . heparin  5,000 Units Subcutaneous Q8H  . lipase/protease/amylase  1 capsule Oral TID AC  . lisinopril  10 mg Oral Daily  . LORazepam  1 mg Intravenous Q6H  . LORazepam  0-4 mg Oral Q4H   Followed by  . LORazepam  0-4 mg Oral Q12H  . LORazepam  1 mg Oral Once  . LORazepam  1 mg Oral Once  . multivitamin  1 tablet Oral QHS  . nicotine  14 mg Transdermal Q0600  . pantoprazole  40 mg Oral Daily  . paricalcitol  6 mcg Intravenous Q M,W,F-1800  . sodium chloride  3 mL Intravenous Q12H  . thiamine  100 mg Oral Daily   Or  . thiamine  100 mg Intravenous Daily  . tiotropium  18 mcg Inhalation Daily  . DISCONTD: LORazepam  0-4 mg Oral Q6H  . DISCONTD: LORazepam  0-4 mg Oral Q12H   Continuous Infusions:  PRN Meds:.sodium chloride, acetaminophen, acetaminophen, albuterol, LORazepam, LORazepam, morphine injection, ondansetron (ZOFRAN) IV, ondansetron, sodium chloride   Assessment/Plan:   # shortness of breath, likely due to fluid overload  Patient left HD an hour early on Friday and ate a lot over the weekend, likely fluid overloaded on admission. Received HD and had a lot of fluid removed yesterday. Since then his SOB, desats, and lung exam have improved.  Will keep him here at least through his Wednesday dialysis treatment, especially in setting of his alcohol withdrawal.   - monitor O2 Sat and respiratory status, continue supplemental O2    # ESRD on HD. MWF schedule  - renal following, getting HD Wednesday   # Abdominal Pain  This has been chronic issue in the past. Patient does have history of chronic recurrent alcoholic pancreatitis and hepatitis C. He was evaluated during admission in December 2012 with no significant abnormality noted by KUB and abdominal ultrasound. While his  tenderness is partially distractible, there seems to be tenderness regardless of distraction. His lipase is mildly elevated at 87, which may a/c ESRD, but is above his baseline.   Plan  - Keep NPO  -Tylenol, morphine PRN for  pain control -resume Creon-pancrease oral Tx once diet advanced and decrease morphine once diet advanced   # Alcohol withdrawal:  Patient says he drinks one fifth per day. Currently appears clinically to be withdrawing due to tremulousness/agitation/pressured speech, both of which are better than when I saw him yesterday. HRs ok but BPs are high. Has ativan 1 mg Q6H IV plus the CIWA protocol PO ativan.  Seems to be doing a bit better than yesterday.  Will emphasize to nursing need to keep up on the CIWA checks and doses.  Will continue clonidine for help with BPs, which are likely elevated due to withdrawal.    # COPD: continues to have a mild wheezing which is likely his baseline.  - continue home inhaler and neb.    # VTE: Heparin     LOS: 2 days   Blanca Friend 09/17/2011, 12:39 PM

## 2011-09-17 NOTE — Progress Notes (Signed)
Internal Medicine Attending  Date: 09/17/2011  Patient name: [name] Medical record number: [id number] Date of birth: 01-Jul-1956 Age: 56 y.o. Gender: male  I saw and evaluated the patient. I reviewed the resident's note by Dr. Yaakov Guthrie and I agree with the resident's findings and plans as documented in his progress note.  [name] fluid overload has been corrected with his most recent dialysis run. He continues to have pain likely associated with his acute on chronic pancreatitis. He still has no appetite and we will increase his analgesic regimen until this process improves. Finally, alcohol withdrawal remains an issue although we have so far been able to keep ahead of it using the CIWA protocol. We will continue current supportive care.

## 2011-09-18 ENCOUNTER — Inpatient Hospital Stay (HOSPITAL_COMMUNITY): Payer: Medicare Other

## 2011-09-18 LAB — CBC
MCH: 32.8 pg (ref 26.0–34.0)
MCHC: 32.4 g/dL (ref 30.0–36.0)
MCV: 101.1 fL — ABNORMAL HIGH (ref 78.0–100.0)
Platelets: 180 10*3/uL (ref 150–400)
RDW: 16.7 % — ABNORMAL HIGH (ref 11.5–15.5)

## 2011-09-18 LAB — RENAL FUNCTION PANEL
Albumin: 3.4 g/dL — ABNORMAL LOW (ref 3.5–5.2)
BUN: 43 mg/dL — ABNORMAL HIGH (ref 6–23)
Creatinine, Ser: 8.18 mg/dL — ABNORMAL HIGH (ref 0.50–1.35)
GFR calc non Af Amer: 6 mL/min — ABNORMAL LOW (ref >90)
Phosphorus: 9 mg/dL — ABNORMAL HIGH (ref 2.3–4.6)

## 2011-09-18 MED ORDER — LORAZEPAM 1 MG PO TABS
0.5000 mg | ORAL_TABLET | ORAL | Status: DC
  Administered 2011-09-18: 4 mg via ORAL
  Administered 2011-09-18 (×2): 2 mg via ORAL
  Administered 2011-09-19: 1 mg via ORAL
  Administered 2011-09-19: 2 mg via ORAL
  Administered 2011-09-19: 1 mg via ORAL
  Filled 2011-09-18: qty 1
  Filled 2011-09-18: qty 2
  Filled 2011-09-18: qty 4
  Filled 2011-09-18: qty 1
  Filled 2011-09-18: qty 2

## 2011-09-18 MED ORDER — MORPHINE SULFATE 2 MG/ML IJ SOLN
INTRAMUSCULAR | Status: AC
  Administered 2011-09-18: 2 mg via INTRAVENOUS
  Filled 2011-09-18: qty 1

## 2011-09-18 MED ORDER — PARICALCITOL 5 MCG/ML IV SOLN
INTRAVENOUS | Status: AC
  Administered 2011-09-18: 6 ug via INTRAVENOUS
  Filled 2011-09-18: qty 2

## 2011-09-18 NOTE — Progress Notes (Signed)
Clinical Social Work-CSW met briefly with pt who was awaiting xray of ribs post fall. Pt remembered CSW from previous admissions and remembered our conversation re: ALF. Pt clear that he was not interested in ALF- he requested housing assistance with regards to finding his own apartment. Pt relayed that he has a check and simply needs assistance locating an apartment. CSW will visit again tomorrow and re-introduce ALF option as pt was clearly anxious about missing x-rays and unable to focus on our conversation. CSW following-Leilany Digeronimo-MSW, [phone]

## 2011-09-18 NOTE — Progress Notes (Signed)
Internal Medicine Attending  Date: 09/18/2011  Patient name: [name] Medical record number: [id number] Date of birth: [birth year] Age: 56 y.o. Gender: male  I saw and evaluated the patient. I reviewed the resident's note by Dr. Max Sane I agree with the resident's findings and plans as documented in his progress note.  [name] looks much improved today while in the hemodialysis unit. He is not jittery and is able to carry on a decent conversation. He denies shortness of breath. He states he continues to have abdominal pain and is not interested in eating at this time. He did mention that he has severe pain over the right lower rib cage. Examination of this area reveals significant point tenderness and some excoriations. When asked about this he states that he fell against the bed rail up on the general medical ward. I agree with the housestaff's plan to obtain a rib films over the area of tenderness to assure he has not broken a rib. We will continue with the CIWA protocol and maintain his n.p.o. status waiting for resolution of his acute on chronic alcoholic pancreatitis.

## 2011-09-18 NOTE — Progress Notes (Signed)
Subjective:  In dialysis this morning. Believes his withdrawal is doing better.  Abd pain remains but is improving.  He is a little hungry but wishes to hold off on food for a bit longer at this time.   Objective: Vital signs in last 24 hours: Filed Vitals:   09/18/11 0815 09/18/11 0830 09/18/11 0900 09/18/11 0930  BP: 179/106 158/80 148/79 168/108  Pulse: 78 71 92 93  Temp:      TempSrc:      Resp: 18 18 16 18   Height:      Weight:      SpO2:       Weight change: -9 lb 7.7 oz (-4.3 kg)  Intake/Output Summary (Last 24 hours) at 09/18/11 1610 Last data filed at 09/17/11 1500  Gross per 24 hour  Intake    120 ml  Output    300 ml  Net   -180 ml   Physical Exam: General: Resting in no distress while getting dialysis. Head: normocephalic and atraumatic.  Neck: no JVD, and no carotid bruits. Lungs: normal respiratory effort, no accessory muscle use, some coarse breath sounds scattered bilaterally. A few crackles in left mid-lower lung field.  Heart: normal rate, regular rhythm, no murmur, no gallop, and no rub.   Abdomen: tender in epigastric area, RUQ when palpated, but non-tender to pressure with stethoscope over these areas. Normal bowel sounds, no distention.  Pulses: 2+ DP/PT pulses bilaterally Neurologic: alert & oriented X3, cranial nerves II-XII intact, strength normal in all extremities. Diffusely tremulous.  Skin: turgor normal and no rashes.     Lab Results: Basic Metabolic Panel:  Lab 09/18/11 9604 09/16/11 0159 09/15/11 1835  NA 134* -- 133*  K 4.8 -- 5.0  CL 94* -- 92*  CO2 22 -- 23  GLUCOSE 85 -- 87  BUN 43* -- 50*  CREATININE 8.18* 9.19* --  CALCIUM 9.8 -- 8.8  MG -- -- --  PHOS 9.0* -- --   Liver Function Tests:  Lab 09/18/11 0738 09/15/11 1835  AST -- 30  ALT -- 29  ALKPHOS -- 79  BILITOT -- 0.2*  PROT -- 7.8  ALBUMIN 3.4* 3.2*    Lab 09/15/11 1835  LIPASE 87*  AMYLASE --   CBC:  Lab 09/18/11 0738 09/16/11 0159 09/15/11 1835  WBC  3.2* 8.2 --  NEUTROABS -- -- 3.2  HGB 11.7* 11.6* --  HCT 36.1* 35.3* --  MCV 101.1* 101.4* --  PLT 180 184 --   Cardiac Enzymes:  Lab 09/16/11 1708 09/16/11 0945 09/16/11 0158  CKTOTAL 114 128 148  CKMB 3.8 3.7 4.0  CKMBINDEX -- -- --  TROPONINI <0.30 <0.30 <0.30   BNP:  Lab 09/15/11 1836  PROBNP 34172.0*   Coagulation:  Lab 09/15/11 1835  LABPROT 12.6  INR 0.92   Urine Drug Screen: Drugs of Abuse     Component Value Date/Time   LABOPIA NEG 09/27/2010 1551   COCAINSCRNUR NEG 09/27/2010 1551   COCAINSCRNUR NONE DETECTED 09/03/2010 1852   LABBENZ NEG 09/27/2010 1551   LABBENZ POSITIVE* 09/03/2010 1852   AMPHETMU NEG 09/27/2010 1551   AMPHETMU NONE DETECTED 09/03/2010 1852   THCU NONE DETECTED 09/03/2010 1852   LABBARB  Value: NONE DETECTED        DRUG SCREEN FOR MEDICAL PURPOSES ONLY.  IF CONFIRMATION IS NEEDED FOR ANY PURPOSE, NOTIFY LAB WITHIN 5 DAYS.        LOWEST DETECTABLE LIMITS FOR URINE DRUG SCREEN Drug Class  Cutoff (ng/mL) Amphetamine      1000 Barbiturate      200 Benzodiazepine   200 Tricyclics       300 Opiates          300 Cocaine          300 THC              50 09/03/2010 1852      Micro Results: Recent Results (from the past 240 hour(s))  MRSA PCR SCREENING     Status: Normal   Collection Time   09/16/11  2:18 AM      Component Value Range Status Comment   MRSA by PCR NEGATIVE  NEGATIVE  Final    Medications: I have reviewed the patient's current medications. Scheduled Meds:   . calcium acetate  667 mg Oral TID WC  . cloNIDine  0.3 mg Oral BID  . darbepoetin (ARANESP) injection - DIALYSIS  40 mcg Intravenous Q Wed-HD  . ferric gluconate (FERRLECIT/NULECIT) IV  125 mg Intravenous Weekly  . folic acid  1 mg Oral Daily  . heparin  5,000 Units Subcutaneous Q8H  . lipase/protease/amylase  1 capsule Oral TID AC  . lisinopril  10 mg Oral Daily  . LORazepam  1 mg Intravenous Q6H  . LORazepam  0-4 mg Oral Q4H   Followed by  . LORazepam  0-4 mg Oral  Q12H  . LORazepam  1 mg Oral Once  . LORazepam  1 mg Oral Once  . multivitamin  1 tablet Oral QHS  . nicotine  14 mg Transdermal Q0600  . pantoprazole  40 mg Oral Daily  . paricalcitol  6 mcg Intravenous Q M,W,F-1800  . sodium chloride  3 mL Intravenous Q12H  . thiamine  100 mg Oral Daily   Or  . thiamine  100 mg Intravenous Daily  . tiotropium  18 mcg Inhalation Daily   Continuous Infusions:  PRN Meds:.sodium chloride, acetaminophen, acetaminophen, albuterol, LORazepam, LORazepam, morphine injection, ondansetron (ZOFRAN) IV, ondansetron, sodium chloride    Assessment/Plan:   # shortness of breath, likely due to fluid overload  Patient left HD an hour early on Friday and ate a lot over the weekend, likely fluid overloaded on admission. Received HD and had a lot of fluid removed Monday. Since then his SOB, desats, and lung exam have improved. Only mild improvement on lung exam from yesterday, but dialysis was just start when I saw him this morning.  - monitor O2 Sat and respiratory status, continue supplemental O2   # ESRD on HD. MWF schedule  - getting HD now, renal is following  # Abdominal Pain  This has been chronic issue in the past. Patient does have history of chronic recurrent alcoholic pancreatitis and hepatitis C. He was evaluated during admission in December 2012 with no significant abnormality noted by KUB and abdominal ultrasound. While his tenderness is partially distractible, there seems to be tenderness regardless of distraction. His lipase is mildly elevated at 87, which may a/c ESRD, but is above his baseline. Likely related to his chronic pancreatitis, perhaps an acute exacerbation given his drinking over the weekend.  He also reported to my attending this morning falling in hospital bed in the midst of his alcohol withdrawal, and my attending found point tenderness to palpation over lower right side ribs anteriorly.  Plan  - Keep NPO for today as patient is not ready  to decrease pain meds in order to eat -Tylenol, morphine PRN for pain control  -  Right rib XRays to rule out fracture -resume Creon-pancrease oral Tx once diet advanced and decrease morphine once diet advanced   # Alcohol withdrawal:  Patient says he drinks one fifth per day. Withdrawal definitely improved since yesterday.  Will stay the course for now with CIWA protocol plus extra Ativan 1 mg IV Q6H.   # HTN: Continued high pressures 168/108.  May still be due to withdrawal, but since withdrawal signs and symptoms are otherwise improving renal started back lisinopril in addition to clonidine.  Will continue to monitor.    # COPD: continues to have a mild wheezing which is likely his baseline.  - continue home inhaler and neb.   # VTE: Heparin   #Dispo: CSW to look into possible placement options to get him out of environment at homeless shelter that is encouraging him to drink     LOS: 3 days   Yaakov Guthrie, BRAD 09/18/2011, 9:52 AM

## 2011-09-18 NOTE — Progress Notes (Signed)
Subjective:   Seen on HD, no current complaints, breathing well  Objective: Vital signs in last 24 hours: Temp:  [97.5 F (36.4 C)-98.5 F (36.9 C)] 98.5 F (36.9 C) (02/13 0643) Pulse Rate:  [65-94] 65  (02/13 0719) Resp:  [16-18] 18  (02/13 0719) BP: (140-176)/(84-107) 176/99 mmHg (02/13 0719) SpO2:  [88 %-98 %] 92 % (02/13 0643) FiO2 (%):  [3 %] 3 % (02/12 1400) Weight:  [68.3 kg (150 lb 9.2 oz)] 68.3 kg (150 lb 9.2 oz) (02/13 0643) Weight change: -4.3 kg (-9 lb 7.7 oz)  Intake/Output from previous day: 02/12 0701 - 02/13 0700 In: 120 [P.O.:120] Out: 300 [Urine:300]   EXAM: General appearance:  Alert, in no apparent distress Resp:  CTA bilaterally Cardio:  RRR without murmur  GI: + BS, soft and nontender Extremities:  No edema Access:  AVG @ L[facility], on HD  Lab Results:  [facility] 09/16/11 0159 09/15/11 1835  WBC 8.2 5.2  HGB 11.6* 11.3*  HCT 35.3* 33.5*  PLT 184 189   BMET:  Basename 09/16/11 0159 09/15/11 1835  NA -- 133*  K -- 5.0  CL -- 92*  CO2 -- 23  GLUCOSE -- 87  BUN -- 50*  CREATININE 9.19* 8.70*  CALCIUM -- 8.8  ALBUMIN -- 3.2*   No results found for this basename: PTH:2 in the last 72 hours Iron Studies: No results found for this basename: IRON,TIBC,TRANSFERRIN,FERRITIN in the last 72 hours  Assessment/Plan: 1.  Volume overload - resolved with HD, now euvolemic, currently on HD with UF goal of 1.5 L. 2.  Acute on chronic pancreatitis - per Primary Team. 3.  ESRD - on HD on MWF @ [facility], stable on HD. 4.  HTN/volume - BP still high, on Clonidine 0.3 mg bid, restarted Lisinopril 10 mg qd (formulary substitution for fosinopril 10 mg/d). 5.  Anemia - Hgb 11.6 on Aranesp 40 mcg on Wed, IV Fe on Mon. 6.  Metabolic bone disease - on Zemplar 6 mcg per HD, on Phoslo 1 with meals. 7.  COPD - stable. 8.  Alcohol abuse/withdrawal - recently moved into homeless shelter [facility] where he was exposed to alcohol, after none for a long period; consider  discharge to a different environment. Less tremulous today.   LOS: 3 days   LYLES,CHARLES 09/18/2011,7:47 AM  Patient seen and examined and agree with assessment and plan as above.  Looks better from withdrawal standpoint, up and dressed walking in halls.  Dialyzed this am without incident.   Vinson Moselle  MD [facility] [phone] pgr    [phone] cell 09/18/2011, 12:34 PM

## 2011-09-19 ENCOUNTER — Emergency Department (HOSPITAL_COMMUNITY)
Admission: EM | Admit: 2011-09-19 | Discharge: 2011-09-19 | Discharge status: Against Medical Advice | Payer: Medicare Other | Attending: Emergency Medicine | Admitting: Emergency Medicine

## 2011-09-19 ENCOUNTER — Encounter (HOSPITAL_COMMUNITY): Payer: Self-pay | Admitting: Emergency Medicine

## 2011-09-19 DIAGNOSIS — M542 Cervicalgia: Secondary | ICD-10-CM | POA: Insufficient documentation

## 2011-09-19 DIAGNOSIS — F1020 Alcohol dependence, uncomplicated: Secondary | ICD-10-CM

## 2011-09-19 DIAGNOSIS — F10239 Alcohol dependence with withdrawal, unspecified: Secondary | ICD-10-CM

## 2011-09-19 MED ORDER — ACETAMINOPHEN 650 MG RE SUPP: 650.0000 mg | Freq: Four times a day (QID) | RECTAL | Status: DC | PRN

## 2011-09-19 MED ORDER — ACETAMINOPHEN 325 MG PO TABS: 650.0000 mg | ORAL_TABLET | Freq: Four times a day (QID) | ORAL | Status: DC | PRN

## 2011-09-19 MED ORDER — LORAZEPAM 1 MG PO TABS: 1.0000 mg | ORAL_TABLET | Freq: Four times a day (QID) | ORAL | Status: DC

## 2011-09-19 NOTE — Progress Notes (Signed)
Subjective:   Larey Seat in hallway last night, c/o right rib pain, lots of complaints, wants IV pain meds, +abd pain.   Objective: Vital signs in last 24 hours: Temp:  [97.5 F (36.4 C)-98.1 F (36.7 C)] 97.5 F (36.4 C) (02/14 0529) Pulse Rate:  [64-95] 92  (02/14 1000) Resp:  [18-20] 20  (02/14 0529) BP: (147-180)/(87-91) 180/90 mmHg (02/14 1000) SpO2:  [93 %-97 %] 93 % (02/14 0843) Weight change: -1.4 kg (-3 lb 1.4 oz)  Intake/Output from previous day: 02/13 0701 - 02/14 0700 In: 0  Out: 1396    EXAM: General appearance:  Alert, in no apparent distress Resp:  CTA bilaterally Cardio:  RRR without murmur  GI: + BS, soft and nontender Extremities:  No edema Access:  AVG @ [facility], on HD  Lab Results:  Basename 09/18/11 0738  WBC 3.2*  HGB 11.7*  HCT 36.1*  PLT 180   BMET:   Basename 09/18/11 0738  NA 134*  K 4.8  CL 94*  CO2 22  GLUCOSE 85  BUN 43*  CREATININE 8.18*  CALCIUM 9.8  ALBUMIN 3.4*   No results found for this basename: PTH:2 in the last 72 hours Iron Studies: No results found for this basename: IRON,TIBC,TRANSFERRIN,FERRITIN in the last 72 hours  Assessment/Plan: 1.  SOB/pulm edema- resolved with HD, now euvolemic, HD yesterday. 2.  Acute on chronic pancreatitis - per Primary Team. 3.  ESRD - on HD on MWF @ [facility].  HD tomorrow (friday). 4.  HTN/volume - clonidine and fosinopril (lisinopril here, sub per pharm) 5.  Anemia - Hgb 11.6 on Aranesp 40 mcg on Wed, IV Fe on Mon. 6.  Metabolic bone disease - on Zemplar 6 mcg per HD, on Phoslo 1 with meals. 7.  COPD - stable. 8.  Alcohol abuse/withdrawal  Vinson Moselle  MD [facility] [phone] pgr    [phone] cell 09/19/2011, 11:49 AM

## 2011-09-19 NOTE — Progress Notes (Signed)
Internal Medicine Attending  Date: 09/19/2011  Patient name: [name] Medical record number: [id number] Date of birth: 1955/11/25 Age: 56 y.o. Gender: male  I saw and evaluated the patient. I reviewed the resident's note by Dr. Yaakov Guthrie and I agree with the resident's findings and plans as documented in his progress note/discharge summary.  [name] was seen on rounds this morning with the housestaff. He remained slightly jittery but was conversive and without dyspnea. The right anterior ribs were bruised and this had progressed from yesterday. Apparently soon after we had completed our rounds [name] decided to leave against medical advice. We were not notified until after he left, so we did not have an opportunity to arrange specific medical therapy or follow up.

## 2011-09-19 NOTE — Discharge Summary (Signed)
Internal Medicine Teaching Program Hospital Discharge Note  Name: [name] MRN: [id number] DOB: 12/07/1955 56 y.o.  Date of Admission: 09/15/2011  6:16 PM Date Patient Left Against Medical Advice: 09/19/2011 Attending Physician: Dr. Lawrence Klima  Discharge Diagnosis: Fluid overload related to ESRD Abdominal Pain Alcohol Withdrawal HTN COPD Polysubstance Abuse Hepatitis C Tobacco Use  Discharge Medications: Patient left the hospital against medical advice.  By the time my team, his primary team, was notified he had already left the hospital.  No discharge medications were discussed with him.   Disposition and follow-up:   Patient left the hospital against medical advice.  By the time my team, his primary team, was notified he had already left the hospital.  No discharge appointments or discharge medications were discussed with him.   Consultations:  Renal  Procedures Performed:  Dg Chest 2 View  09/03/2011  *RADIOLOGY REPORT*  Clinical Data: Cough.  Short of breath.  CHEST - 2 VIEW  Comparison: 07/19/2011  Findings: Upper normal heart size.  Stable right internal jugular tunneled dialysis catheter.  No pneumothorax and no pleural effusion.  No edema or consolidation.  IMPRESSION: No active cardiopulmonary disease.  Original Report Authenticated By: ARTHUR A. HOSS, M.D.   Dg Ribs Unilateral W/chest Right  09/18/2011  *RADIOLOGY REPORT*  Clinical Data: Fall.  Right chest pain  RIGHT RIBS AND CHEST - 3+ VIEW  Comparison: 09/15/2011  Findings: COPD with hyperinflation of the lungs.  Lungs are clear without infiltrate or effusion.  No pneumothorax.  Right rib detail reveals no rib fracture.  IMPRESSION: COPD.  No acute abnormality.  Negative for right rib fracture.  Original Report Authenticated By: DAVID C. CLARK, M.D.   Ir Removal Tun Cv Cath W/o Fl  09/10/2011  *RADIOLOGY REPORT*  Clinical Data: End-stage renal disease, functioning left upper arm AV graft, catheter access no  longer required  RIGHT INTERNAL JUGULAR TUNNELED HEMODIAYLISIS CATHER REMOVAL  Date:  09/10/2011 12:00:00  Radiologist:  M. Trevor Shick, M.D.  Medications:  1% lidocaine locally  Complications:  No immediate  PROCEDURE/FINDINGS:  Informed consent was obtained from the patient following explanation of the procedure, risks, benefits and alternatives. The patient understands, agrees and consents for the procedure. All questions were addressed.  A time out was performed.  Maximal barrier sterile technique utilized including caps, mask, sterile gowns, sterile gloves, large sterile drape, hand hygiene, and Chloroprep.  1% lidocaine was injected under sterile conditions along the subcutaneous tunnel.  The right internal jugulartunneled catheter was removed utilizing blunt and sharp dissection to release the retention cuff.  The catheter was removed entirely.  Hemostasis was obtained with compression.  No immediate complication.  Sterile dressing applied.  The patient tolerated the procedure well.  IMPRESSION: Successful right internal jugulartunnel dialysis catheter removal  Original Report Authenticated By: M. TREVOR SHICK, M.D.   Dg Chest Port 1v Same Day  09/15/2011  *RADIOLOGY REPORT*  Clinical Data: The respiratory distress.  Right neck pain following removal of dialysis catheter.  PORTABLE CHEST - 1 VIEW SAME DAY  Comparison: Two-view chest x-ray 09/03/2011.  Findings: The heart is mildly enlarged.  Mild pulmonary vascular congestion is superimposed on baseline of COPD.  Mild bibasilar atelectasis is present.  IMPRESSION:  1.  Borderline cardiomegaly and mild pulmonary vascular congestion.  Original Report Authenticated By: CHRISTOPHER W. MATTERN, M.D.    Admission HPI:  [name] is a 56 y.o. male with PMH of multiple medical problems including ESRD on HD, COPD, respiratory failure (  Jan 2012), chronic recurrent pancreatitis and polysubstance abuse who presents with shortness of breath today. Patient  received of full hemodialysis on Friday and had an uneventful weekend. He states that he started to have shortness of breath and wheezing today. He also complaints of diffused vague abdominal pain, dull, 5/10, no radiation. No precipitating factors, alleviating factors or aggravating factors. Patient is poor historian and very anxious and difficult to interview. Patient presents to the ED with a decreased O2 sats 84% while breathing shallowly, which was quickly corrected was O2 supplement.  Denies headache, fever, or sore throat. No chest pain, chest pressure or palpitation No nausea, vomiting. No melena, diarrhea or incontinence. No muscle weakness. Denies depression. No appetite or weight changes.  Of note, Patient has had Lt Arm AV fistula placement and insertion of right IJ HD catheter on 07/19/11.  patient had right IJ HD catheter removal on 09/10/11 with self-reported bleeding afterwards. Bruising noted on his right neck. He reports right-sided neck pain since the catheter removal.   Hospital Course by problem list:   Fluid overload related to ESRD:  Patient left HD an hour early on Friday two days before admission and he ate a lot and drank a lot of alcohol in those two days before admission.  Admission CXRay showed mild pulmonary vascular congestion, and crackles bibasilar present on lung exam.  Likely fluid overloaded on admission. Renal was consulted and he had dialysis twice during this admission.  A total of over 7L fluid removed, following which renal consultant thought patient was euvolemic. Pt. initially was desatting to 70s with movement and yanking off his nasal cannula, but this improved after first dialysis treatment.  Lung exam also improved, but mild scattered crackles remained on day that he left against medical advice.  He left against medical advice before we felt that he was safely ready to leave the hospital.  Abdominal Pain: Borderline elevated lipase on admission 87, above  three most recent values of lipase for him.  Has history of chronic pancreatitis.  He was made NPO on admission.  He reported RUQ pain with RUQ tenderness that was usually 100% distractible on exam.  Has history of narcotic seeking behavior.  One day during this hospitalization I felt that his RUQ tenderness was not fully distractible, and morphine PRN was started.  He never missed asking for a dose of this.  On last day of hospitalization, morphine was stopped and he was placed on clear liquid diet.  He left the hospital against medical advice soon after this.    Alcohol Withdrawal: Patient began to show signs and symptoms of alcohol withdrawal starting day after admission.  He reported drinking a full fifth (full bottle) of liquor per day at baseline.  On first day after admission he was diffusely tremulous with elevated blood pressures and tachycardia.  He was placed on CIWA ativan protocol starting on admission, and additional scheduled Ativan 1mg IV Q6H was started on first day after admission.  His tremulousness improved slowly over the next few days until day he left the hospital.  No discharge planning for his alcohol abuse / withdrawal could be discussed.  Primary team did not feel that his withdrawal was improved enough for him to leave the hospital on the day that he walked out.    HTN: Pressures as high as 190s/100s during hospitalization.  Most likely due mainly to his alcohol withdrawal.  Clonidine was continued on admission.  HTN initially possibly due to his fluid overload   as well.  Pressures started to improve after dialysis x 2 and lisinopril started.  However, he left against medical advice before a final regimen could be decided on.  Last four blood pressures before he walked out of the hospital: 149/87, 150/88, 147/91, 180/90.   COPD: Diffuse mild wheezing present on exam through hospitalization.  Treated with nebulizers here.  We were not able to plan a home regimen for him after  discharge as he left against medical advice and was out of the hospital before we were notified.    Tobacco Use: Nicotine patch given during hospitalization.  Assistance for quitting outside the hospital not discussed before he left against medical advice.    Discharge Vitals:  BP 180/90  Pulse 92  Temp(Src) 97.5 F (36.4 C) (Oral)  Resp 20  Ht 6' (1.829 m)  Wt 147 lb 7.8 oz (66.9 kg)  BMI 20.00 kg/m2  SpO2 93%  Discharge Labs: No results found for this or any previous visit (from the past 24 hour(s)).  Signed: Chelsye Suhre, BRAD 09/19/2011, 1:34 PM   

## 2011-09-19 NOTE — ED Notes (Signed)
Pt left without being seen; did removed PIV from right FA

## 2011-09-19 NOTE — ED Notes (Signed)
Pt sts signed out AMA from upstairs to go get his backpack; pt sts pain in right flank pain from pancreatitis and pain in neck; pt still have PIV in arm; pt sts signed out [facility]

## 2011-09-19 NOTE — Progress Notes (Signed)
Internal Medicine Teaching [facility] Discharge Note  Name: [name] MRN: [id number] DOB: 02-23-1956 56 y.o.  Date of Admission: 09/15/2011  6:16 PM Date Patient Left Against Medical Advice: 09/19/2011 Attending Physician: Dr. Doneen Poisson  Discharge Diagnosis: Fluid overload related to ESRD Abdominal Pain Alcohol Withdrawal HTN COPD Polysubstance Abuse Hepatitis C Tobacco Use  Discharge Medications: Patient left the hospital against medical advice.  By the time my team, his primary team, was notified he had already left the hospital.  No discharge medications were discussed with him.   Disposition and follow-up:   Patient left the hospital against medical advice.  By the time my team, his primary team, was notified he had already left the hospital.  No discharge appointments or discharge medications were discussed with him.   Consultations:  Renal  Procedures Performed:  Dg Chest 2 View  09/03/2011  *RADIOLOGY REPORT*  Clinical Data: Cough.  Short of breath.  CHEST - 2 VIEW  Comparison: 07/19/2011  Findings: Upper normal heart size.  Stable right internal jugular tunneled dialysis catheter.  No pneumothorax and no pleural effusion.  No edema or consolidation.  IMPRESSION: No active cardiopulmonary disease.  Original Report Authenticated By: Donavan Burnet, M.D.   Dg Ribs Unilateral W/chest Right  09/18/2011  *RADIOLOGY REPORT*  Clinical Data: Fall.  Right chest pain  RIGHT RIBS AND CHEST - 3+ VIEW  Comparison: 09/15/2011  Findings: COPD with hyperinflation of the lungs.  Lungs are clear without infiltrate or effusion.  No pneumothorax.  Right rib detail reveals no rib fracture.  IMPRESSION: COPD.  No acute abnormality.  Negative for right rib fracture.  Original Report Authenticated By: Camelia Phenes, M.D.   Ir Removal Tun Cv Cath W/o Fl  09/10/2011  *RADIOLOGY REPORT*  Clinical Data: End-stage renal disease, functioning left upper arm AV graft, catheter access no  longer required  RIGHT INTERNAL JUGULAR TUNNELED HEMODIAYLISIS CATHER REMOVAL  Date:  09/10/2011 12:00:00  Radiologist:  M. Ruel Favors, M.D.  Medications:  1% lidocaine locally  Complications:  No immediate  PROCEDURE/FINDINGS:  Informed consent was obtained from the patient following explanation of the procedure, risks, benefits and alternatives. The patient understands, agrees and consents for the procedure. All questions were addressed.  A time out was performed.  Maximal barrier sterile technique utilized including caps, mask, sterile gowns, sterile gloves, large sterile drape, hand hygiene, and Chloroprep.  1% lidocaine was injected under sterile conditions along the subcutaneous tunnel.  The right internal jugulartunneled catheter was removed utilizing blunt and sharp dissection to release the retention cuff.  The catheter was removed entirely.  Hemostasis was obtained with compression.  No immediate complication.  Sterile dressing applied.  The patient tolerated the procedure well.  IMPRESSION: Successful right internal jugulartunnel dialysis catheter removal  Original Report Authenticated By: Judie Petit. Ruel Favors, M.D.   Dg Chest Port 1v Same Day  09/15/2011  *RADIOLOGY REPORT*  Clinical Data: The respiratory distress.  Right neck pain following removal of dialysis catheter.  PORTABLE CHEST - 1 VIEW SAME DAY  Comparison: Two-view chest x-ray 09/03/2011.  Findings: The heart is mildly enlarged.  Mild pulmonary vascular congestion is superimposed on baseline of COPD.  Mild bibasilar atelectasis is present.  IMPRESSION:  1.  Borderline cardiomegaly and mild pulmonary vascular congestion.  Original Report Authenticated By: Jamesetta Orleans. MATTERN, M.D.    Admission HPI:  [name] is a 56 y.o. male with PMH of multiple medical problems including ESRD on HD, COPD, respiratory failure (  Jan 2012), chronic recurrent pancreatitis and polysubstance abuse who presents with shortness of breath today. Patient  received of full hemodialysis on Friday and had an uneventful weekend. He states that he started to have shortness of breath and wheezing today. He also complaints of diffused vague abdominal pain, dull, 5/10, no radiation. No precipitating factors, alleviating factors or aggravating factors. Patient is poor historian and very anxious and difficult to interview. Patient presents to the ED with a decreased O2 sats 84% while breathing shallowly, which was quickly corrected was O2 supplement.  Denies headache, fever, or sore throat. No chest pain, chest pressure or palpitation No nausea, vomiting. No melena, diarrhea or incontinence. No muscle weakness. Denies depression. No appetite or weight changes.  Of note, Patient has had Lt Arm AV fistula placement and insertion of right IJ HD catheter on 07/19/11.  patient had right IJ HD catheter removal on 09/10/11 with self-reported bleeding afterwards. Bruising noted on his right neck. He reports right-sided neck pain since the catheter removal.   Hospital Course by problem list:   Fluid overload related to ESRD:  Patient left HD an hour early on Friday two days before admission and he ate a lot and drank a lot of alcohol in those two days before admission.  Admission CXRay showed mild pulmonary vascular congestion, and crackles bibasilar present on lung exam.  Likely fluid overloaded on admission. Renal was consulted and he had dialysis twice during this admission.  A total of over 7L fluid removed, following which renal consultant thought patient was euvolemic. Pt. initially was desatting to 70s with movement and yanking off his nasal cannula, but this improved after first dialysis treatment.  Lung exam also improved, but mild scattered crackles remained on day that he left against medical advice.  He left against medical advice before we felt that he was safely ready to leave the hospital.  Abdominal Pain: Borderline elevated lipase on admission 87, above  three most recent values of lipase for him.  Has history of chronic pancreatitis.  He was made NPO on admission.  He reported RUQ pain with RUQ tenderness that was usually 100% distractible on exam.  Has history of narcotic seeking behavior.  One day during this hospitalization I felt that his RUQ tenderness was not fully distractible, and morphine PRN was started.  He never missed asking for a dose of this.  On last day of hospitalization, morphine was stopped and he was placed on clear liquid diet.  He left the hospital against medical advice soon after this.    Alcohol Withdrawal: Patient began to show signs and symptoms of alcohol withdrawal starting day after admission.  He reported drinking a full fifth (full bottle) of liquor per day at baseline.  On first day after admission he was diffusely tremulous with elevated blood pressures and tachycardia.  He was placed on CIWA ativan protocol starting on admission, and additional scheduled Ativan 1mg  IV Q6H was started on first day after admission.  His tremulousness improved slowly over the next few days until day he left the hospital.  No discharge planning for his alcohol abuse / withdrawal could be discussed.  Primary team did not feel that his withdrawal was improved enough for him to leave the hospital on the day that he walked out.    HTN: Pressures as high as 190s/100s during hospitalization.  Most likely due mainly to his alcohol withdrawal.  Clonidine was continued on admission.  HTN initially possibly due to his fluid overload  as well.  Pressures started to improve after dialysis x 2 and lisinopril started.  However, he left against medical advice before a final regimen could be decided on.  Last four blood pressures before he walked out of the hospital: 149/87, 150/88, 147/91, 180/90.   COPD: Diffuse mild wheezing present on exam through hospitalization.  Treated with nebulizers here.  We were not able to plan a home regimen for him after  discharge as he left against medical advice and was out of the hospital before we were notified.    Tobacco Use: Nicotine patch given during hospitalization.  Assistance for quitting outside the hospital not discussed before he left against medical advice.    Discharge Vitals:  BP 180/90  Pulse 92  Temp(Src) 97.5 F (36.4 C) (Oral)  Resp 20  Ht 6' (1.829 m)  Wt 147 lb 7.8 oz (66.9 kg)  BMI 20.00 kg/m2  SpO2 93%  Discharge Labs: No results found for this or any previous visit (from the past 24 hour(s)).  Signed: Javier Gell, BRAD 09/19/2011, 1:34 PM

## 2011-09-23 NOTE — Discharge Summary (Deleted)
Internal Medicine Teaching Program Hospital Discharge Note  Name: [name] MRN: [id number] DOB: 07/11/1956 55 y.o.  Date of Admission: 09/15/2011  6:16 PM Date Patient Left Against Medical Advice: 09/19/2011 Attending Physician: Dr. Lawrence Klima  Discharge Diagnosis: Fluid overload related to ESRD Abdominal Pain Alcohol Withdrawal HTN COPD Polysubstance Abuse Hepatitis C Tobacco Use  Discharge Medications: Patient left the hospital against medical advice.  By the time my team, his primary team, was notified he had already left the hospital.  No discharge medications were discussed with him.   Disposition and follow-up:   Patient left the hospital against medical advice.  By the time my team, his primary team, was notified he had already left the hospital.  No discharge appointments or discharge medications were discussed with him.   Consultations:  Renal  Procedures Performed:  Dg Chest 2 View  09/03/2011  *RADIOLOGY REPORT*  Clinical Data: Cough.  Short of breath.  CHEST - 2 VIEW  Comparison: 07/19/2011  Findings: Upper normal heart size.  Stable right internal jugular tunneled dialysis catheter.  No pneumothorax and no pleural effusion.  No edema or consolidation.  IMPRESSION: No active cardiopulmonary disease.  Original Report Authenticated By: ARTHUR A. HOSS, M.D.   Dg Ribs Unilateral W/chest Right  09/18/2011  *RADIOLOGY REPORT*  Clinical Data: Fall.  Right chest pain  RIGHT RIBS AND CHEST - 3+ VIEW  Comparison: 09/15/2011  Findings: COPD with hyperinflation of the lungs.  Lungs are clear without infiltrate or effusion.  No pneumothorax.  Right rib detail reveals no rib fracture.  IMPRESSION: COPD.  No acute abnormality.  Negative for right rib fracture.  Original Report Authenticated By: DAVID C. CLARK, M.D.   Ir Removal Tun Cv Cath W/o Fl  09/10/2011  *RADIOLOGY REPORT*  Clinical Data: End-stage renal disease, functioning left upper arm AV graft, catheter access no  longer required  RIGHT INTERNAL JUGULAR TUNNELED HEMODIAYLISIS CATHER REMOVAL  Date:  09/10/2011 12:00:00  Radiologist:  M. Trevor Shick, M.D.  Medications:  1% lidocaine locally  Complications:  No immediate  PROCEDURE/FINDINGS:  Informed consent was obtained from the patient following explanation of the procedure, risks, benefits and alternatives. The patient understands, agrees and consents for the procedure. All questions were addressed.  A time out was performed.  Maximal barrier sterile technique utilized including caps, mask, sterile gowns, sterile gloves, large sterile drape, hand hygiene, and Chloroprep.  1% lidocaine was injected under sterile conditions along the subcutaneous tunnel.  The right internal jugulartunneled catheter was removed utilizing blunt and sharp dissection to release the retention cuff.  The catheter was removed entirely.  Hemostasis was obtained with compression.  No immediate complication.  Sterile dressing applied.  The patient tolerated the procedure well.  IMPRESSION: Successful right internal jugulartunnel dialysis catheter removal  Original Report Authenticated By: M. TREVOR SHICK, M.D.   Dg Chest Port 1v Same Day  09/15/2011  *RADIOLOGY REPORT*  Clinical Data: The respiratory distress.  Right neck pain following removal of dialysis catheter.  PORTABLE CHEST - 1 VIEW SAME DAY  Comparison: Two-view chest x-ray 09/03/2011.  Findings: The heart is mildly enlarged.  Mild pulmonary vascular congestion is superimposed on baseline of COPD.  Mild bibasilar atelectasis is present.  IMPRESSION:  1.  Borderline cardiomegaly and mild pulmonary vascular congestion.  Original Report Authenticated By: CHRISTOPHER W. MATTERN, M.D.    Admission HPI:  [name] is a 55 y.o. male with PMH of multiple medical problems including ESRD on HD, COPD, respiratory failure (  Jan 2012), chronic recurrent pancreatitis and polysubstance abuse who presents with shortness of breath today. Patient  received of full hemodialysis on Friday and had an uneventful weekend. He states that he started to have shortness of breath and wheezing today. He also complaints of diffused vague abdominal pain, dull, 5/10, no radiation. No precipitating factors, alleviating factors or aggravating factors. Patient is poor historian and very anxious and difficult to interview. Patient presents to the ED with a decreased O2 sats 84% while breathing shallowly, which was quickly corrected was O2 supplement.  Denies headache, fever, or sore throat. No chest pain, chest pressure or palpitation No nausea, vomiting. No melena, diarrhea or incontinence. No muscle weakness. Denies depression. No appetite or weight changes.  Of note, Patient has had Lt Arm AV fistula placement and insertion of right IJ HD catheter on 07/19/11.  patient had right IJ HD catheter removal on 09/10/11 with self-reported bleeding afterwards. Bruising noted on his right neck. He reports right-sided neck pain since the catheter removal.   Hospital Course by problem list:   Fluid overload related to ESRD:  Patient left HD an hour early on Friday two days before admission and he ate a lot and drank a lot of alcohol in those two days before admission.  Admission CXRay showed mild pulmonary vascular congestion, and crackles bibasilar present on lung exam.  Likely fluid overloaded on admission. Renal was consulted and he had dialysis twice during this admission.  A total of over 7L fluid removed, following which renal consultant thought patient was euvolemic. Pt. initially was desatting to 70s with movement and yanking off his nasal cannula, but this improved after first dialysis treatment.  Lung exam also improved, but mild scattered crackles remained on day that he left against medical advice.  He left against medical advice before we felt that he was safely ready to leave the hospital.  Abdominal Pain: Borderline elevated lipase on admission 87, above  three most recent values of lipase for him.  Has history of chronic pancreatitis.  He was made NPO on admission.  He reported RUQ pain with RUQ tenderness that was usually 100% distractible on exam.  Has history of narcotic seeking behavior.  One day during this hospitalization I felt that his RUQ tenderness was not fully distractible, and morphine PRN was started.  He never missed asking for a dose of this.  On last day of hospitalization, morphine was stopped and he was placed on clear liquid diet.  He left the hospital against medical advice soon after this.    Alcohol Withdrawal: Patient began to show signs and symptoms of alcohol withdrawal starting day after admission.  He reported drinking a full fifth (full bottle) of liquor per day at baseline.  On first day after admission he was diffusely tremulous with elevated blood pressures and tachycardia.  He was placed on CIWA ativan protocol starting on admission, and additional scheduled Ativan 1mg IV Q6H was started on first day after admission.  His tremulousness improved slowly over the next few days until day he left the hospital.  No discharge planning for his alcohol abuse / withdrawal could be discussed.  Primary team did not feel that his withdrawal was improved enough for him to leave the hospital on the day that he walked out.    HTN: Pressures as high as 190s/100s during hospitalization.  Most likely due mainly to his alcohol withdrawal.  Clonidine was continued on admission.  HTN initially possibly due to his fluid overload   as well.  Pressures started to improve after dialysis x 2 and lisinopril started.  However, he left against medical advice before a final regimen could be decided on.  Last four blood pressures before he walked out of the hospital: 149/87, 150/88, 147/91, 180/90.   COPD: Diffuse mild wheezing present on exam through hospitalization.  Treated with nebulizers here.  We were not able to plan a home regimen for him after  discharge as he left against medical advice and was out of the hospital before we were notified.    Tobacco Use: Nicotine patch given during hospitalization.  Assistance for quitting outside the hospital not discussed before he left against medical advice.    Discharge Vitals:  BP 180/90  Pulse 92  Temp(Src) 97.5 F (36.4 C) (Oral)  Resp 20  Ht 6' (1.829 m)  Wt 147 lb 7.8 oz (66.9 kg)  BMI 20.00 kg/m2  SpO2 93%  Discharge Labs: No results found for this or any previous visit (from the past 24 hour(s)).  Signed: Ivelisse Culverhouse, BRAD 09/19/2011, 1:34 PM   

## 2011-09-30 ENCOUNTER — Encounter: Payer: Self-pay | Admitting: Vascular Surgery

## 2011-10-01 ENCOUNTER — Encounter (INDEPENDENT_AMBULATORY_CARE_PROVIDER_SITE_OTHER): Payer: Medicare Other | Admitting: *Deleted

## 2011-10-01 ENCOUNTER — Encounter: Payer: Self-pay | Admitting: Vascular Surgery

## 2011-10-01 ENCOUNTER — Ambulatory Visit (INDEPENDENT_AMBULATORY_CARE_PROVIDER_SITE_OTHER): Payer: Medicare Other | Admitting: Vascular Surgery

## 2011-10-01 VITALS — BP 169/96 | HR 79 | Resp 18 | Ht 72.0 in | Wt 160.0 lb

## 2011-10-01 DIAGNOSIS — M79609 Pain in unspecified limb: Secondary | ICD-10-CM | POA: Insufficient documentation

## 2011-10-01 DIAGNOSIS — N186 End stage renal disease: Secondary | ICD-10-CM

## 2011-10-01 NOTE — Progress Notes (Signed)
The patient presents today for evaluation of a closure of the pain. He is known to me from a recent placement of a left upper arm AV graft and hemodialysis catheter. He is having good function of his graft currently. This was placed in December 2012. He presents today for evaluation of lower extremity pain. He reports this is severe and intolerable. This occurs constantly with an aching discomfort from his knees distally. He cannot get any relief from this. He does not have any history of DVT or arterial insufficiency.  Physical exam well-developed well-nourished white male no acute distress. Left upper arm AV graft functioning quite nicely with well-healed antecubital and axillary incision. Normal bilateral femoral popliteal and pedal pulses with no evidence of tissue loss  Vascular lab: Normal lower surety arterial study with normal pressures and normal triphasic waveforms.  Impression and plan: Bilateral lower extremity pain of unclear etiology. I discussed with Mr. Schnebly I do not see any evidence of arterial or venous pathology to cause this. I have referred him to neurology for consultation determine if this is neurogenic. He does have a history of degenerative disc disease in his back. He is requesting pain medication and I explained that I cannot do this since he does not have a vascular etiology for his pain. He will see [place] on an as-needed basis

## 2011-10-03 ENCOUNTER — Ambulatory Visit: Payer: Medicare Other | Admitting: Vascular Surgery

## 2011-10-09 IMAGING — CT CT ABD-PELV W/O CM
2 of 4 series · 17 of 46 positions shown, 19 images · non-contrast
Comparison: 01/29/2011

CLINICAL DATA: Left-sided abdominal pain and weakness

CT ABDOMEN AND PELVIS WITHOUT CONTRAST
TECHNIQUE: Multidetector CT imaging of the abdomen and pelvis was
performed following the standard protocol without intravenous
contrast.

[Series 2: abd/pelv w/o 5.0 b31f st · axial · non-contrast · 0.69mm/px · z∈[-736,-346]mm · 14 of 86 slices shown, 16 images]
[im 4/86  soft-tissue]
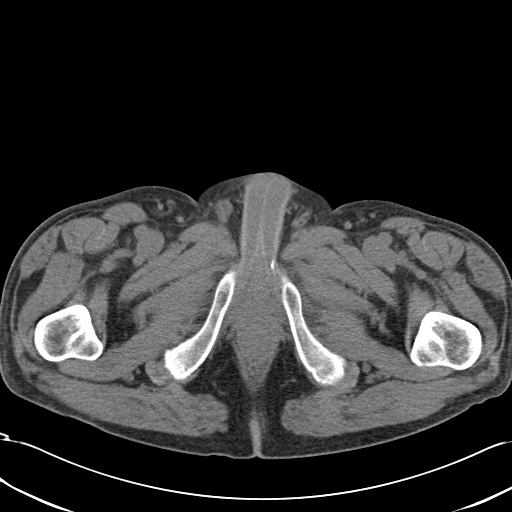
[im 4/86  bone]
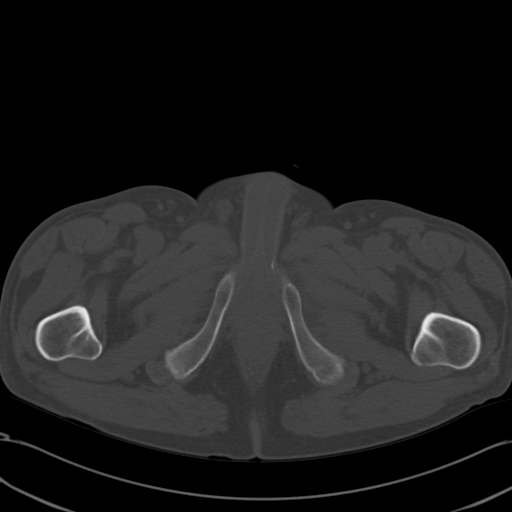
[im 10/86  soft-tissue]
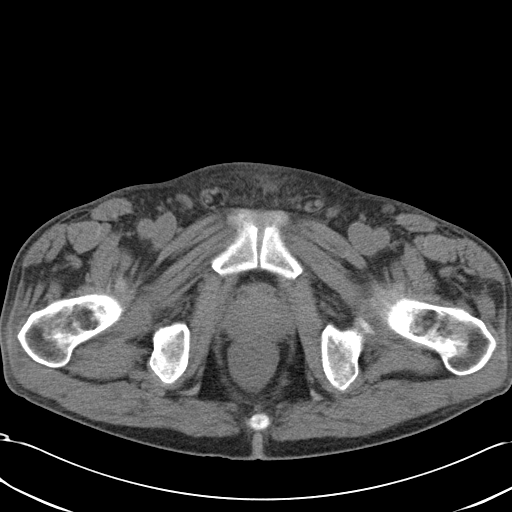
[im 17/86  soft-tissue]
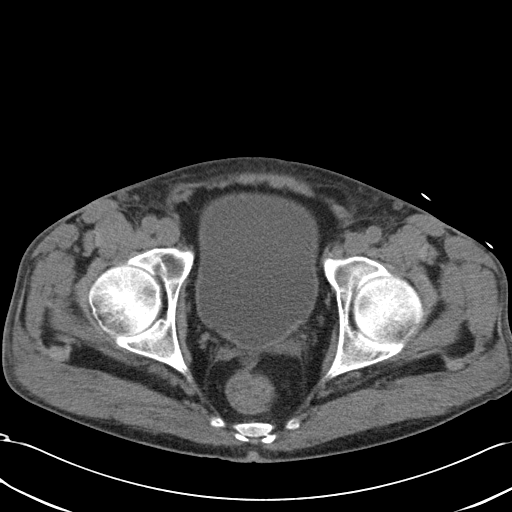
[im 23/86  soft-tissue]
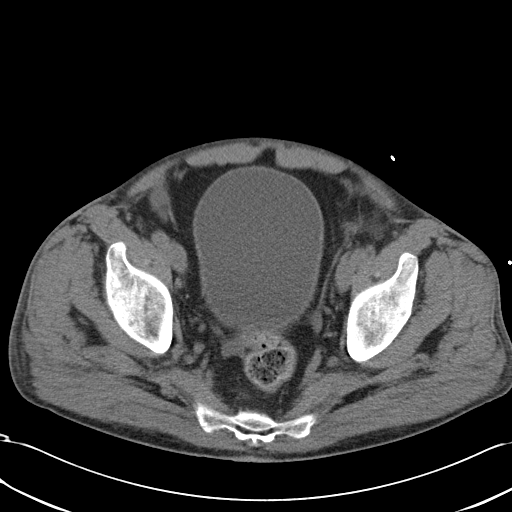
[im 30/86  soft-tissue]
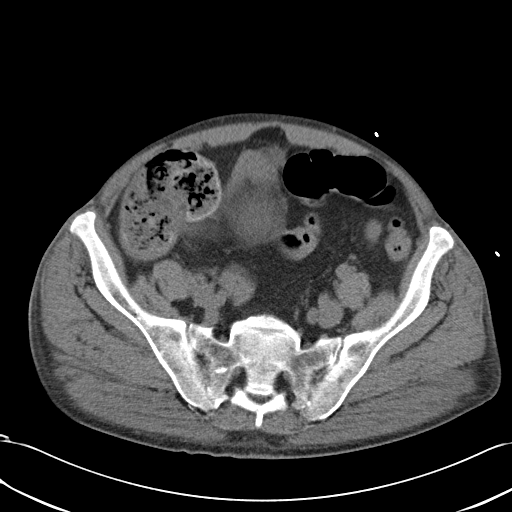
[im 33/86  soft-tissue]
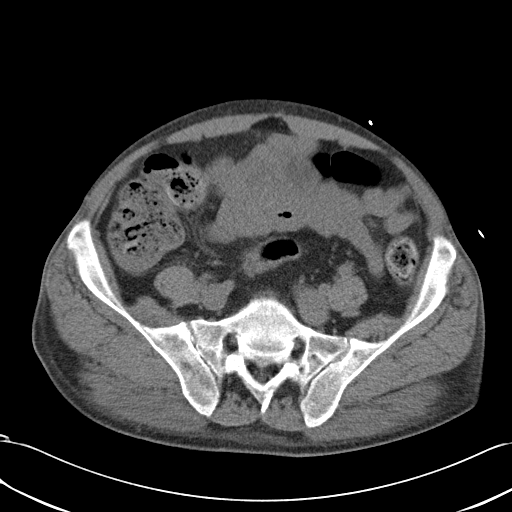
[im 40/86  soft-tissue]
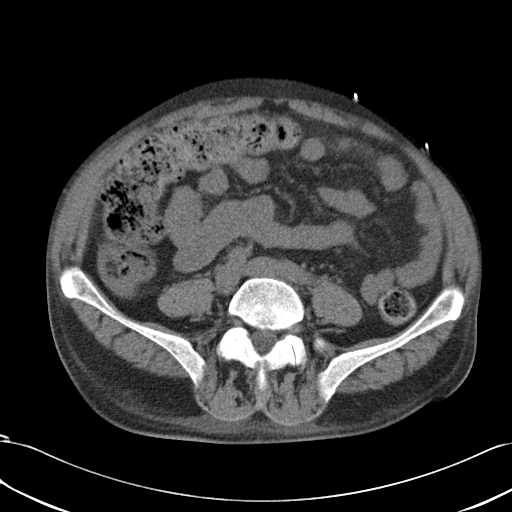
[im 46/86  soft-tissue]
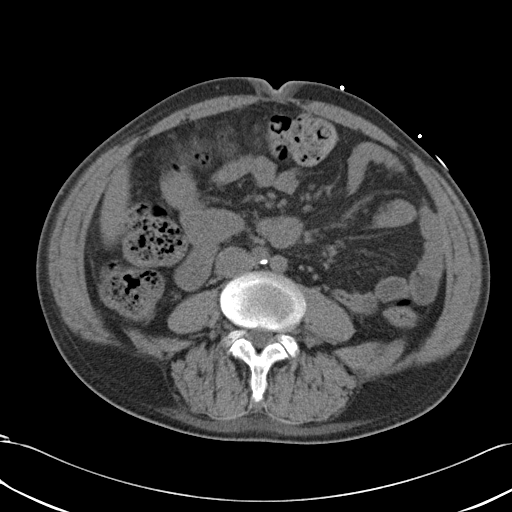
[im 53/86  soft-tissue]
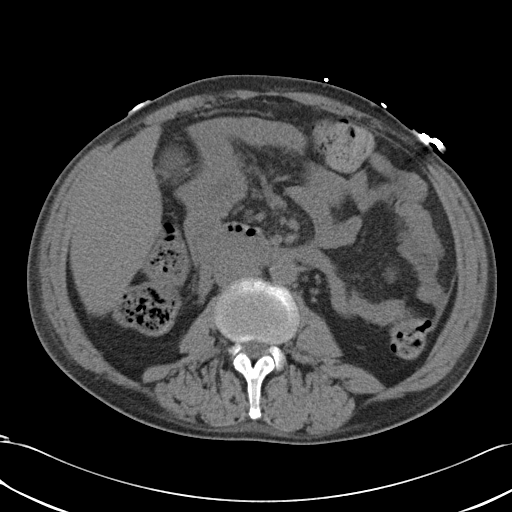
[im 53/86  bone]
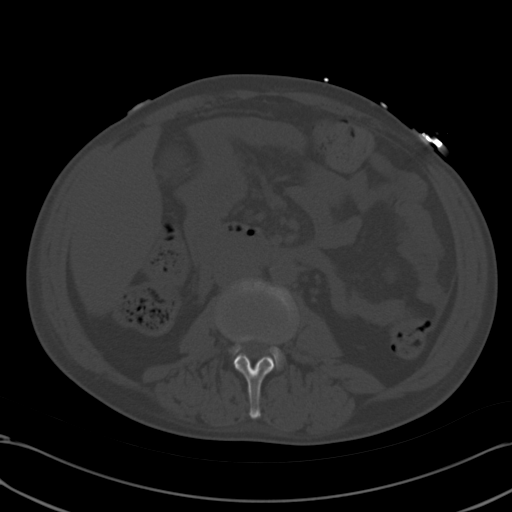
[im 56/86  soft-tissue]
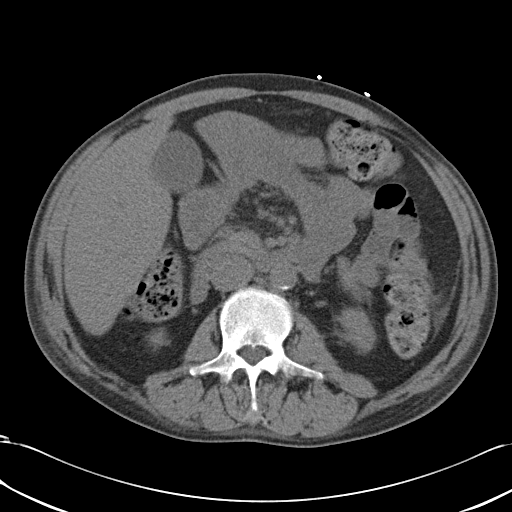
[im 63/86  soft-tissue]
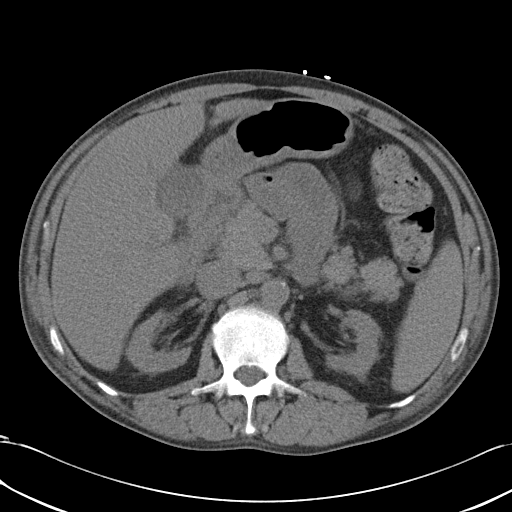
[im 69/86  soft-tissue]
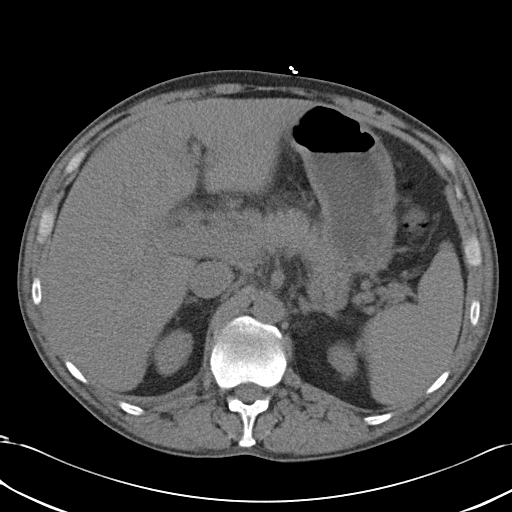
[im 76/86  soft-tissue]
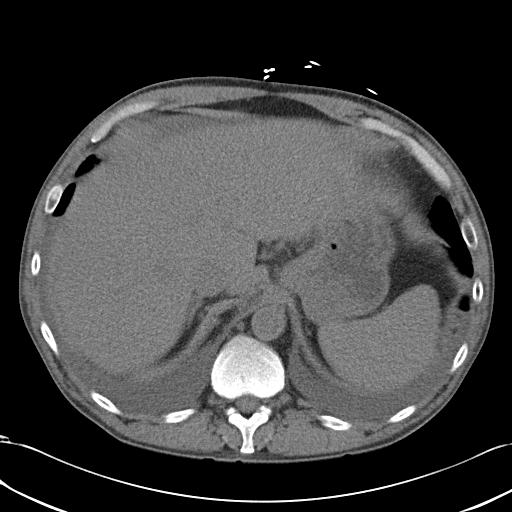
[im 82/86  soft-tissue]
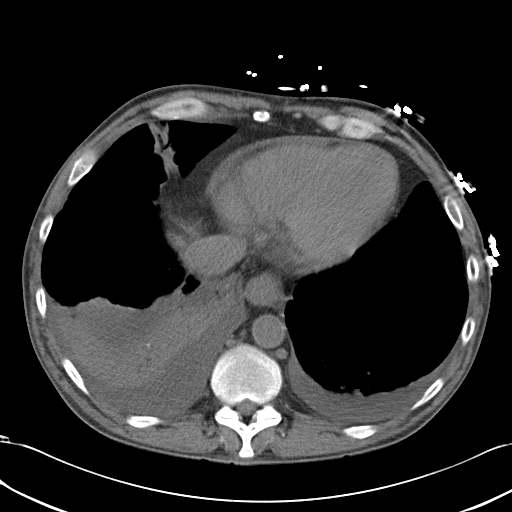

[Series 5: abd/pelv w/o 2.0 spo thins · coronal · non-contrast · 1.04mm/px · 3 of 117 slices shown]
[im 39/117  soft-tissue]
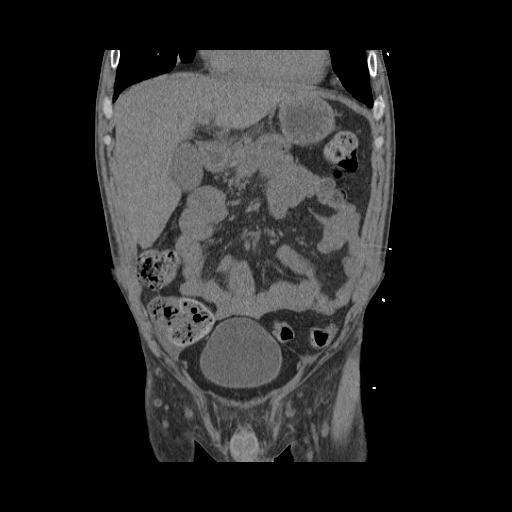
[im 52/117  soft-tissue]
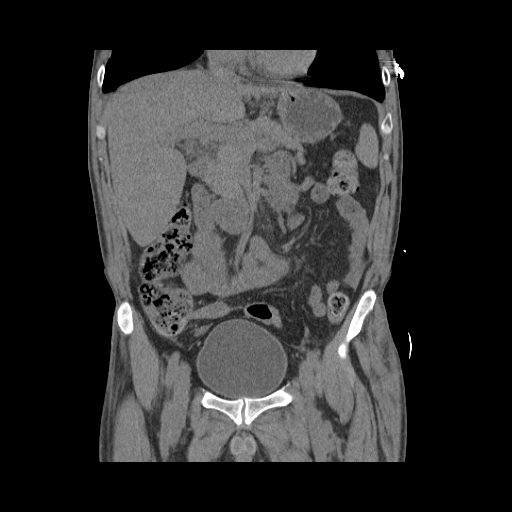
[im 65/117  soft-tissue]
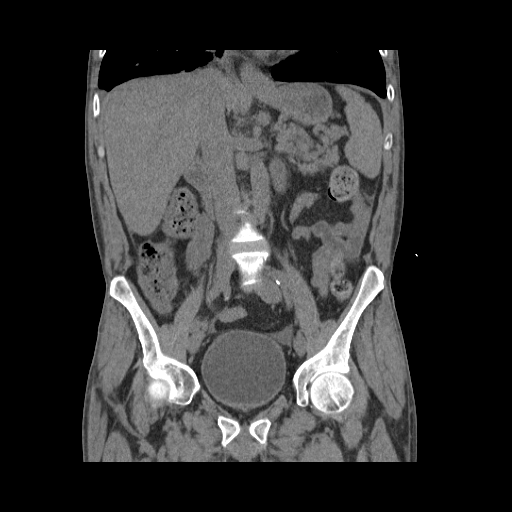

[17 of 46 positions shown; findings below may reference images not displayed]

FINDINGS: Right greater than left bilateral pleural effusions,
right lower lobe consolidation likely indicating compressive
atelectasis, and linear right middle lobe consolidation are noted.
There is interlobular septal thickening.

Trace perihepatic ascites.  Unenhanced liver is unremarkable.
Gallbladder, adrenal glands, kidneys, and pancreas are normal.
Bilateral renal cortical atrophy noted.  5 mm exophytic left lower
renal pole cyst or trace perinephric fluid is noted.  This may have
not been previously seen due to differences in technique.
Splenorenal varices are present.  This is unchanged.  Trace
pericardial effusion.

Unenhanced bowel unremarkable.  Trace pelvic ascites is noted.
Subcutaneous mild edema is noted.  No bowel wall thickening or
focal segmental dilatation.  Mild atherosclerotic aortic changes
again noted.  Lumbar spine degenerative change is present.
IMPRESSION: Interstitial pulmonary edema again noted with effusions and
associated probable compressive atelectasis.  Superimposed
pneumonia is not excluded.

Trace abdominal ascites and subcutaneous edema may suggest third
spacing in the setting of volume overload.

Splenorenal collaterals suggest portal hypertension or possibly
underlying liver cirrhosis.

No acute abnormality to explain the history of left-sided abdominal
pain.

## 2011-10-09 IMAGING — CR DG CHEST 1V PORT
2 series · 2 of 2 positions shown · non-contrast
Comparison: 02/14/2011

CLINICAL DATA: Weakness and vomiting

PORTABLE CHEST - 1 VIEW

[AP (1 of 2)]
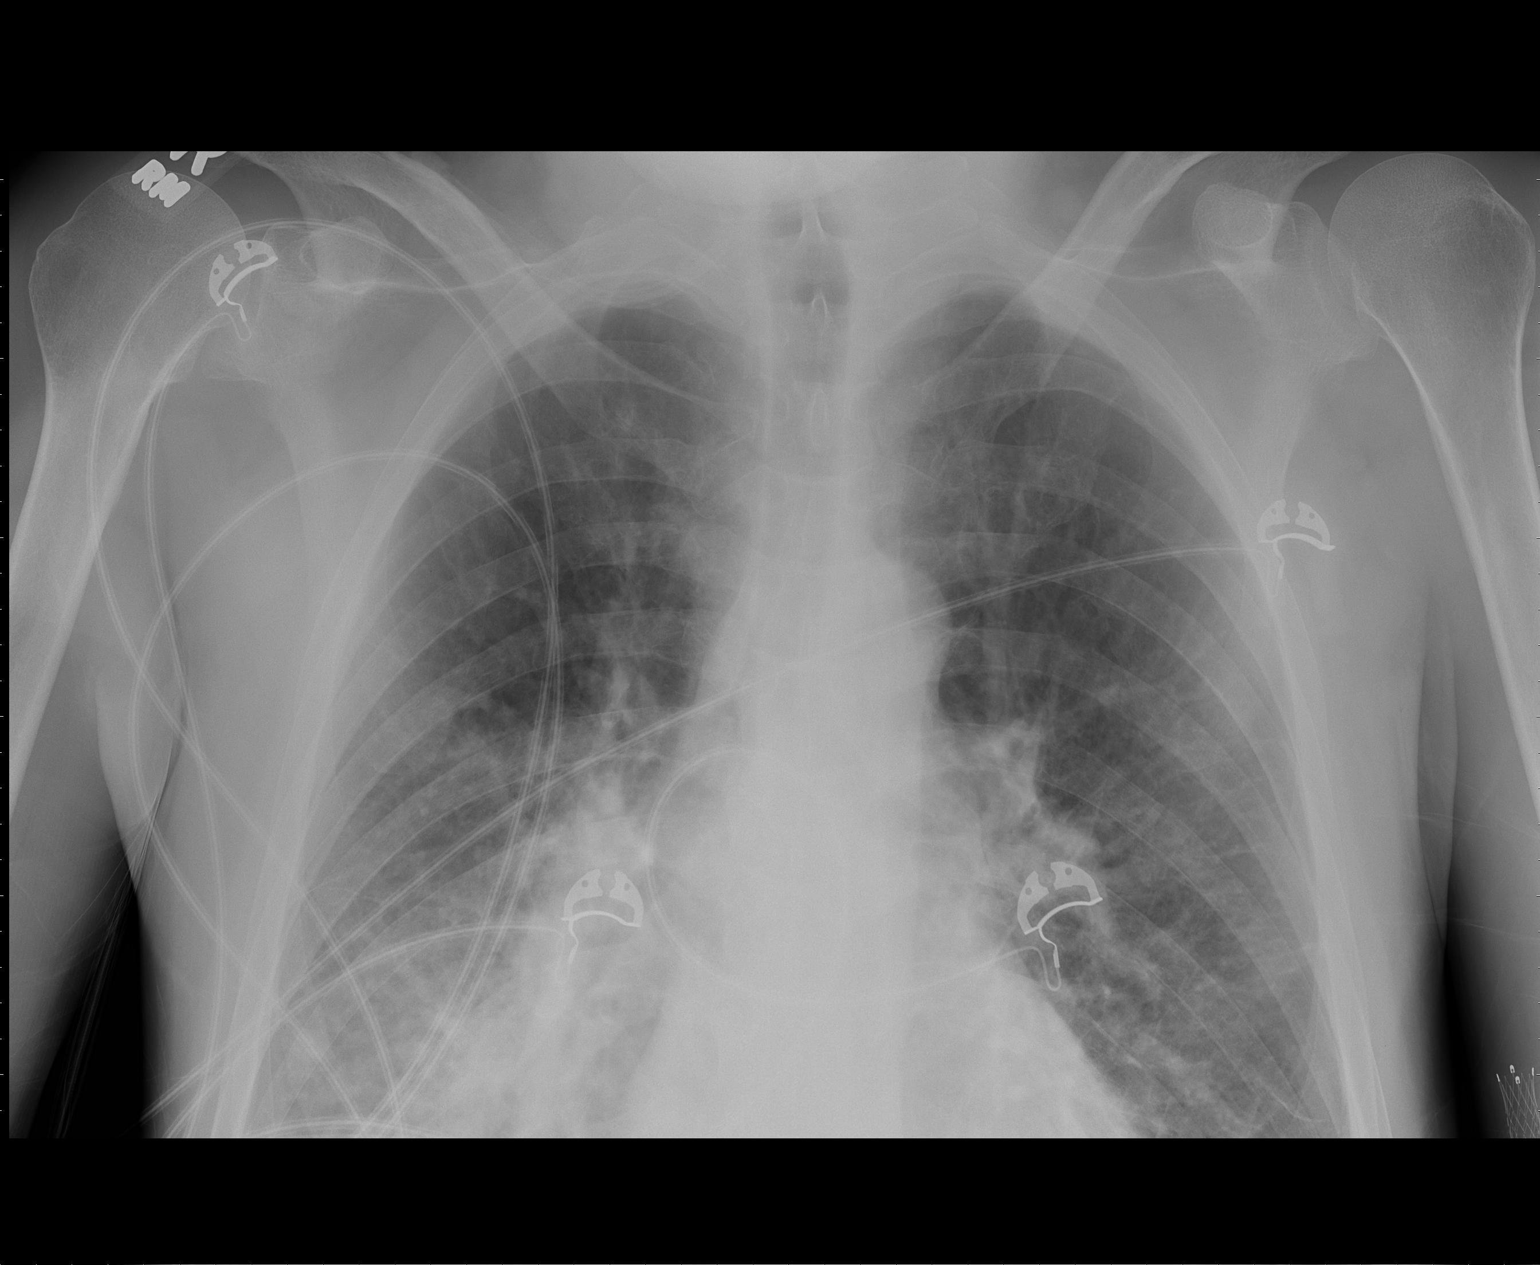

[AP (2 of 2)]
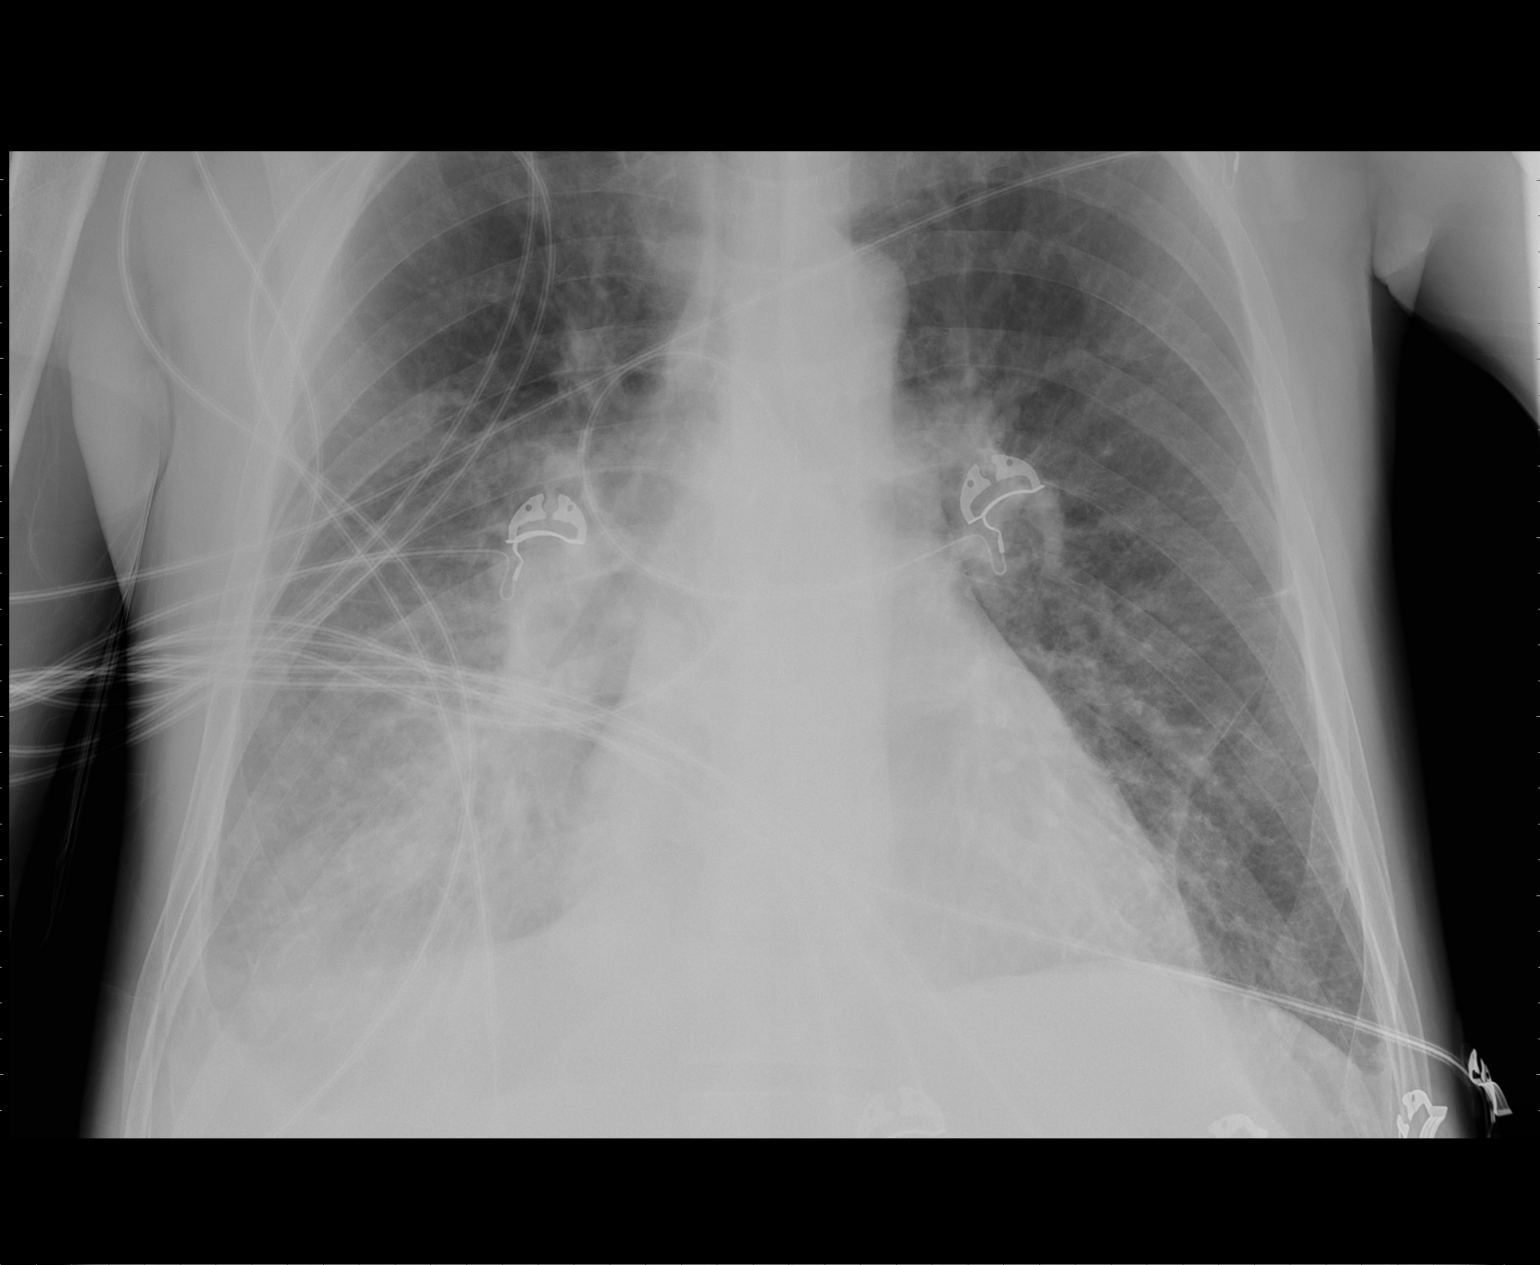

[2 of 2 positions shown; findings below may reference images not displayed]

FINDINGS: New hazy right lower lung zone airspace opacification
with trace bilateral effusions.  There are interstitial Kerley B
lines with mild enlargement cardiomediastinal silhouette. No new
osseous abnormality.
IMPRESSION: Interstitial Kerley B lines with trace effusions, likely indicating
edema.

New hazy right lower lobe opacification could reflect asymmetric
alveolar edema, although superimposed pneumonia or other alveolar
filling process could have a similar appearance.

## 2011-10-10 IMAGING — CR DG CHEST 1V PORT
1 series · 1 of 1 positions shown · non-contrast
Comparison: 04/03/2011

CLINICAL DATA: Pneumonia.

PORTABLE CHEST - 1 VIEW

[AP]
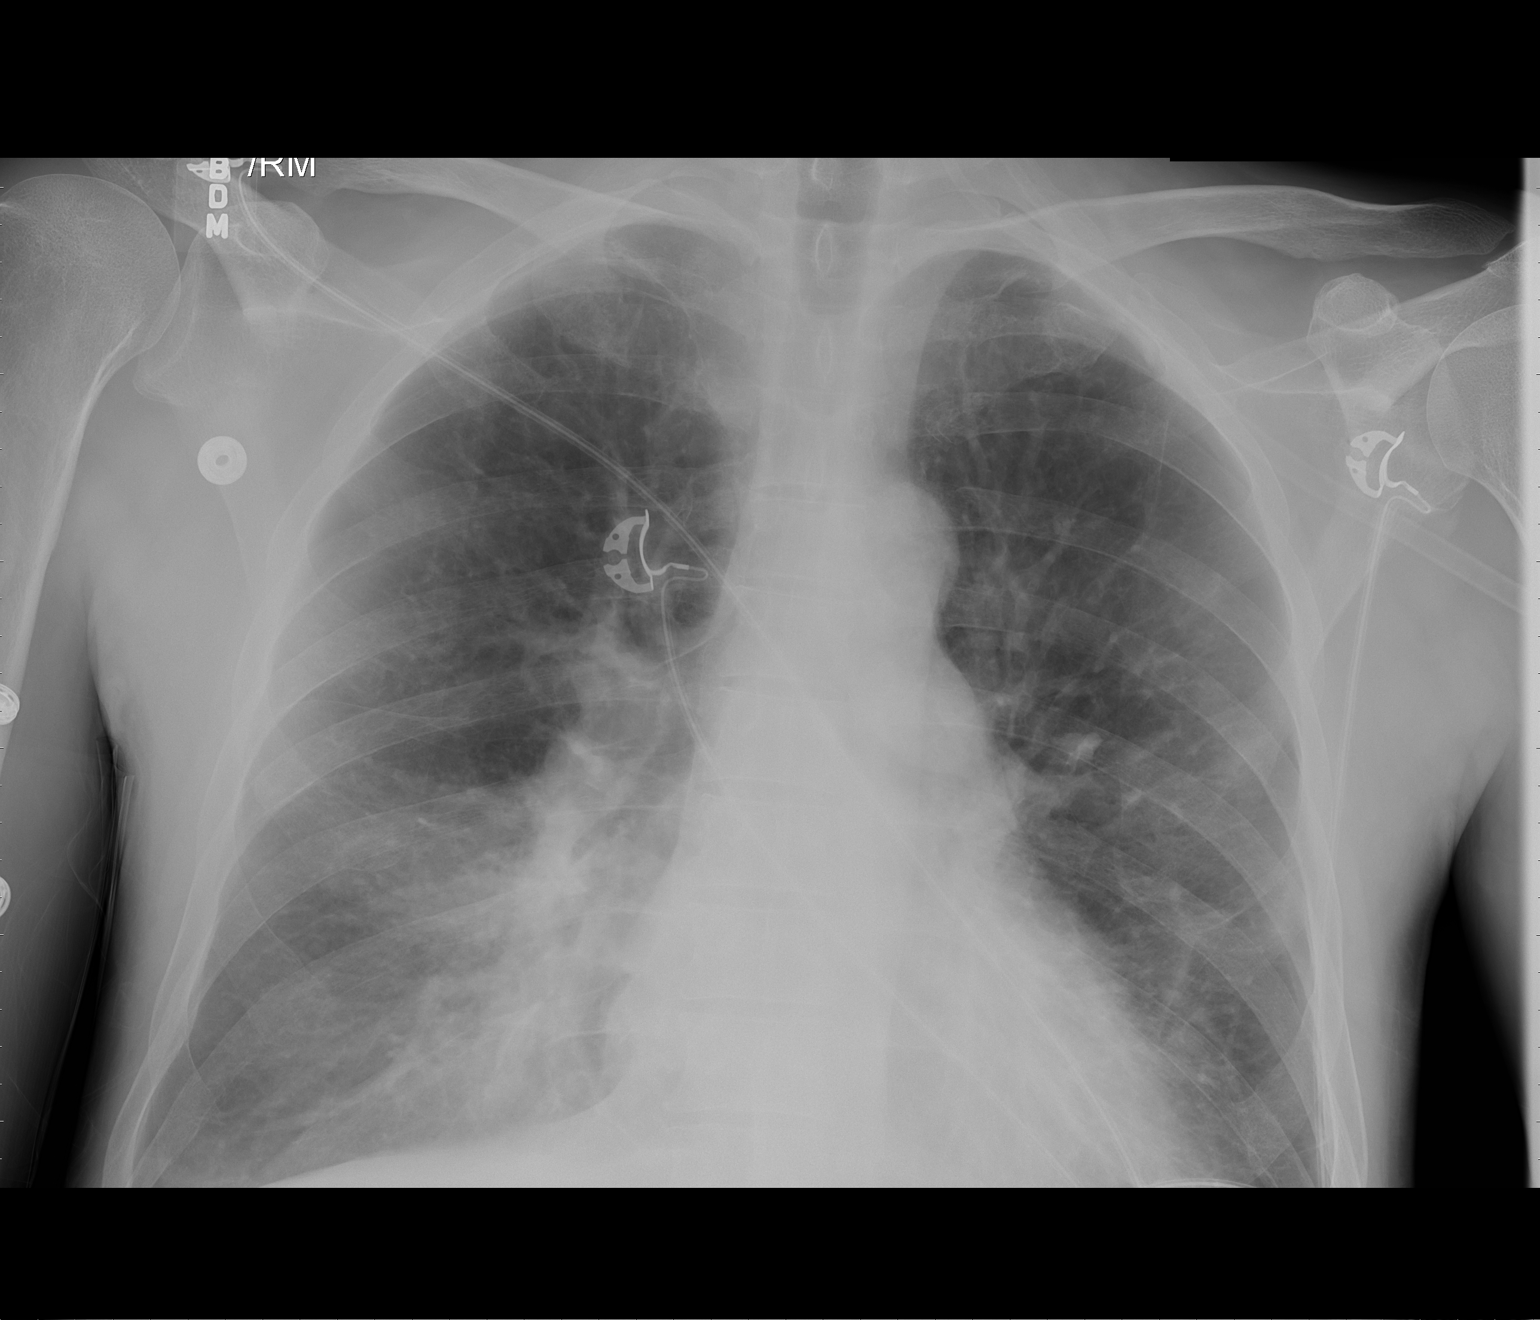

[1 of 1 positions shown; findings below may reference images not displayed]

FINDINGS: Right lower lobe consolidation appears improved.  No
overt edema.  Stable COPD.
IMPRESSION: Improved right lower lobe consolidation.

## 2011-10-15 ENCOUNTER — Encounter: Payer: Self-pay | Admitting: Internal Medicine

## 2011-10-15 ENCOUNTER — Ambulatory Visit (INDEPENDENT_AMBULATORY_CARE_PROVIDER_SITE_OTHER): Payer: Medicare Other | Admitting: Internal Medicine

## 2011-10-15 DIAGNOSIS — R110 Nausea: Secondary | ICD-10-CM

## 2011-10-15 DIAGNOSIS — R112 Nausea with vomiting, unspecified: Secondary | ICD-10-CM | POA: Insufficient documentation

## 2011-10-15 DIAGNOSIS — R197 Diarrhea, unspecified: Secondary | ICD-10-CM

## 2011-10-15 DIAGNOSIS — R1110 Vomiting, unspecified: Secondary | ICD-10-CM

## 2011-10-15 DIAGNOSIS — Z23 Encounter for immunization: Secondary | ICD-10-CM

## 2011-10-15 DIAGNOSIS — Z1211 Encounter for screening for malignant neoplasm of colon: Secondary | ICD-10-CM | POA: Insufficient documentation

## 2011-10-15 DIAGNOSIS — I10 Essential (primary) hypertension: Secondary | ICD-10-CM

## 2011-10-15 DIAGNOSIS — M79609 Pain in unspecified limb: Secondary | ICD-10-CM

## 2011-10-15 DIAGNOSIS — N186 End stage renal disease: Secondary | ICD-10-CM

## 2011-10-15 DIAGNOSIS — R1084 Generalized abdominal pain: Secondary | ICD-10-CM

## 2011-10-15 LAB — BASIC METABOLIC PANEL
BUN: 38 mg/dL — ABNORMAL HIGH (ref 6–23)
Calcium: 8.6 mg/dL (ref 8.4–10.5)
Chloride: 94 mEq/L — ABNORMAL LOW (ref 96–112)
Creat: 8.17 mg/dL — ABNORMAL HIGH (ref 0.50–1.35)

## 2011-10-15 LAB — CBC
Hemoglobin: 10.5 g/dL — ABNORMAL LOW (ref 13.0–17.0)
MCH: 32.2 pg (ref 26.0–34.0)
Platelets: 173 10*3/uL (ref 150–400)
RBC: 3.26 MIL/uL — ABNORMAL LOW (ref 4.22–5.81)
RDW: 15.5 % (ref 11.5–15.5)

## 2011-10-15 MED ORDER — PANTOPRAZOLE SODIUM 40 MG PO TBEC: 40.0000 mg | DELAYED_RELEASE_TABLET | Freq: Every day | ORAL | Status: DC

## 2011-10-15 MED ORDER — THIAMINE HCL 100 MG PO TABS: 100.0000 mg | ORAL_TABLET | Freq: Every day | ORAL | Status: DC

## 2011-10-15 MED ORDER — TIOTROPIUM BROMIDE MONOHYDRATE 18 MCG IN CAPS: 18.0000 ug | ORAL_CAPSULE | Freq: Every day | RESPIRATORY_TRACT | Status: DC

## 2011-10-15 MED ORDER — ALBUTEROL SULFATE HFA 108 (90 BASE) MCG/ACT IN AERS: 2.0000 | INHALATION_SPRAY | RESPIRATORY_TRACT | Status: DC | PRN

## 2011-10-15 MED ORDER — FOLIC ACID 1 MG PO TABS: 1.0000 mg | ORAL_TABLET | Freq: Every day | ORAL | Status: DC

## 2011-10-15 MED ORDER — FOSINOPRIL SODIUM 10 MG PO TABS: 10.0000 mg | ORAL_TABLET | Freq: Every day | ORAL | Status: DC

## 2011-10-15 MED ORDER — AMYLASE-LIPASE-PROTEASE 33.2-10-37.5 MU PO CPEP: 3.0000 | ORAL_CAPSULE | Freq: Three times a day (TID) | ORAL | Status: DC

## 2011-10-15 MED ORDER — ZOLPIDEM TARTRATE 5 MG PO TABS: 5.0000 mg | ORAL_TABLET | Freq: Every evening | ORAL | Status: DC | PRN

## 2011-10-15 MED ORDER — CLONIDINE HCL 0.3 MG PO TABS: 0.3000 mg | ORAL_TABLET | Freq: Two times a day (BID) | ORAL | Status: DC

## 2011-10-15 MED ORDER — RENA-VITE RX 1 MG PO TABS: 1.0000 | ORAL_TABLET | Freq: Every day | ORAL | Status: DC

## 2011-10-15 MED ORDER — CALCIUM ACETATE 667 MG PO CAPS: 667.0000 mg | ORAL_CAPSULE | Freq: Three times a day (TID) | ORAL | Status: DC

## 2011-10-15 MED ORDER — ONDANSETRON HCL 4 MG PO TABS: 4.0000 mg | ORAL_TABLET | Freq: Three times a day (TID) | ORAL | Status: DC | PRN

## 2011-10-15 MED ORDER — CLONAZEPAM 0.5 MG PO TABS: 0.5000 mg | ORAL_TABLET | Freq: Two times a day (BID) | ORAL | Status: DC | PRN

## 2011-10-15 NOTE — Assessment & Plan Note (Signed)
Will administer vaccination for diphtheria, tetanus, and acellular pertussis today

## 2011-10-15 NOTE — Assessment & Plan Note (Addendum)
Patient reports nausea with one episode of non-bloody, non-bilious emesis on the evening prior to his office visit. He has not continued to vomit is able to tolerate by mouth medications.  Will refill his Zofran.  It is possible that his single episode of vomiting as well as to loose bowel movements other results of a mild viral gastroenteritis.  As he is currently able to tolerate oral intake and is afebrile and hemodynamically stable; will manage as an outpatient with Zofran for symptomatic relief.

## 2011-10-15 NOTE — Progress Notes (Signed)
Subjective:     Patient ID: [name], male   DOB: 1955-10-22, 56 y.o.   MRN: [id number]  HPI  Pt is here for routine f/u and medication refills.  He has no acute complaints.  He requests narcotic medications for treatment of pancreatic pain.  Informed patient that he should only be experiencing pancreatic pain and during an acute flare of pancreatitis.  Informed patient that should he experience acute abdominal pain consistent with pancreatitis it is critical that he come to the emergency room for evaluation and treatment as pancreatitis may be fatal.  Patient continues to request narcotic medications.  Informed patient that it would not be wise to prescribe narcotics to be used at home for an acute pancreatic flare as this may result in delayed arrival to the emergency room, delayed treatment, and increased risk of poor outcome, including death.  Patient expresses his frustration at lack of treatment for his pain.  Told patient I am not comfortable prescribing him narcotic medications for pain that is intermittent in nature and may represent a serious illness.  Informed him he may talk to his primary care provider about management of his pain, but that he currently does not have any indications for chronic narcotics.  After pt informed he will not be prescribed narcotics, he reports vomiting on the evening prior to his OV with 2 episodes of diarrhea today.  He admits to blood present with wiping after a bowel movement.    More than 40 minutes with at least 50% face-to-face time was spent counseling patient about his chronic medical conditions and acute medical concerns; time also devoted to chart review and coordinating care with her healthcare providers.  Review of Systems  Constitutional: Negative for fever, chills, diaphoresis, activity change, appetite change, fatigue and unexpected weight change.  HENT: Negative for hearing loss, congestion and neck stiffness.   Eyes: Negative for  photophobia, pain and visual disturbance.  Respiratory: Negative for cough, chest tightness, shortness of breath and wheezing.   Cardiovascular: Negative for chest pain and palpitations.  Gastrointestinal: Positive for abdominal pain and as outlined above Genitourinary: Negative for dysuria, hematuria and difficulty urinating.  Musculoskeletal: Negative for joint swelling.  Neurological: Negative for dizziness, syncope, speech difficulty, weakness, numbness and headaches.      Objective:   Physical Exam VItal signs reviewed and stable. GEN: No apparent distress.  Alert and oriented x 3.  Pleasant, conversant, and cooperative to exam. HEENT: head is autraumatic and normocephalic.  Neck is supple without palpable masses or lymphadenopathy.  No JVD or bruits.  Vision intact.  EOMI.  PERRLA.  Sclerae anicteric.  Conjunctivae without pallor or injection. Mucous membranes are moist.  Oropharynx is without erythema, exudates, or other abnormal lesions.   RESP:  Lungs are clear to ascultation bilaterally with good air movement.  No wheezes, ronchi, or rubs. CARDIOVASCULAR: regular rate, normal rhythm.   ABDOMEN: soft,  non-distended, diffusely TTP.  Bowels sounds present in all quadrants and normoactive.  No palpable masses. EXT: warm and dry.  Peripheral pulses equal, intact, and +2 globally.  No clubbing or cyanosis.  Trace edema in bilateral lower extremities. SKIN: warm and dry with normal turgor.  No rashes or abnormal lesions observed. NEURO: CN II-XII grossly intact.  Muscle strength +5/5 in bilateral upper and lower extremities.  Sensation is grossly intact.  No focal deficit.     Assessment:

## 2011-10-15 NOTE — Patient Instructions (Addendum)
Schedule an appointment with Dr. Baltazar Apo at her earliest available appointment. Continue to take all of your medications as directed. I will call you if any of your blood work is abnormal. We will refer you to a gastroenterologist for a colonoscopy. This is to screen for colon cancer. Ambien is a medicine to help you sleep.  Use as directed

## 2011-10-15 NOTE — Assessment & Plan Note (Signed)
Patient does not believe he has received influenza vaccination. We'll provide today

## 2011-10-15 NOTE — Assessment & Plan Note (Signed)
Will refer the patient to gastroenterology for a colonoscopy to screen for colon cancer

## 2011-10-15 NOTE — Assessment & Plan Note (Signed)
Patient has chronic abdominal pain with diffuse tenderness to palpation present on exam.  This is unchanged from usual, he does not appear in acute distress, and is hemodynamically stable.  There are no indications currently for treatment with narcotic medications as outlined in his history of present illness.

## 2011-10-15 NOTE — Assessment & Plan Note (Addendum)
Patient reports to 2 episodes of diarrhea today.  He does not describe copious amounts of watery, bloody, or dark-colored diarrhea.  He admits to the presence of blood when wiping; this is consistent with hemorrhoids.  I do not believe he is experiencing significant volume or blood loss as he is hemodynamically stable today, does not provide a hx of significant GI bleeding, and appears euvolemic on exam.  Will obtain a CBC to evaluate for possible underlying infection, i.e. gastroenteritis - would expect a leukocytosis on CBC; I expect his Hbg will be within normal limits.  Will also obtain a metabolic panel to assess his electrolyte status

## 2011-10-17 ENCOUNTER — Telehealth: Payer: Self-pay | Admitting: *Deleted

## 2011-10-17 IMAGING — XA IR PTA VENOUS
1 series · 13 of 24 positions shown · non-contrast
Comparison: None available

CLINICAL DATA: end stage renal disease, pulling clots during
dialysis

DIALYSIS SHUNTOGRAM
VENOUS ANGIOPLASTY
TECHNIQUE: An 18-gauge angiocatheter was placed   antegrade into
the   patient's left upper arm synthetic straightAV hemodialysis
fistula just central to the arterial anastomosis for dialysis
fistulography. The angiocatheter and surrounding skin were then
prepped with Betadine, draped in usual sterile fashion, infiltrated
locally with 1% lidocaine. The angiocatheter was exchanged over a
Benson wire for a 6 French vascular sheath, through which a 7 mm x
4 cm Conquest angioplasty balloon was advanced to the level of a
venous anastomotic stenosis for venous angioplasty using 60 second
overlapping inflations at 30 atmospheres. After follow-up
venography, the catheter, sheath, and guidewire were removed and
hemostasis achieved with a 2-0 Ethilon purse-string suture. No
immediate complication.

[Series 1: run · 13 of 30 slices shown]
[im 1/30]
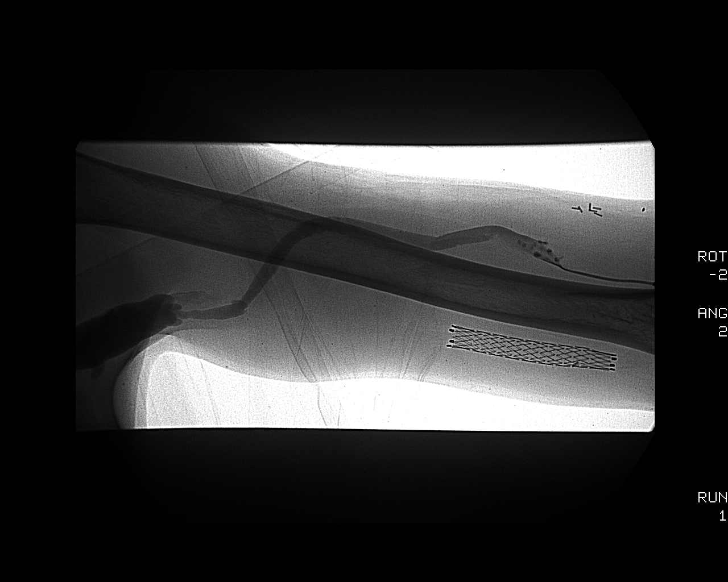
[im 3/30]
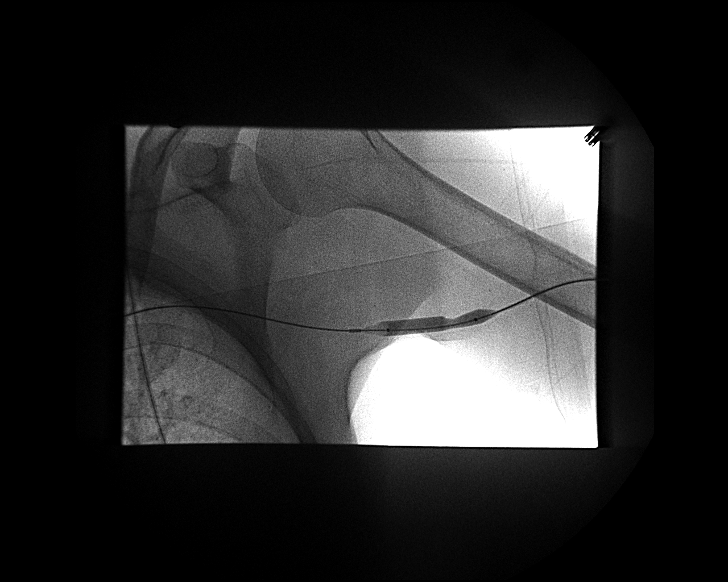
[im 6/30]
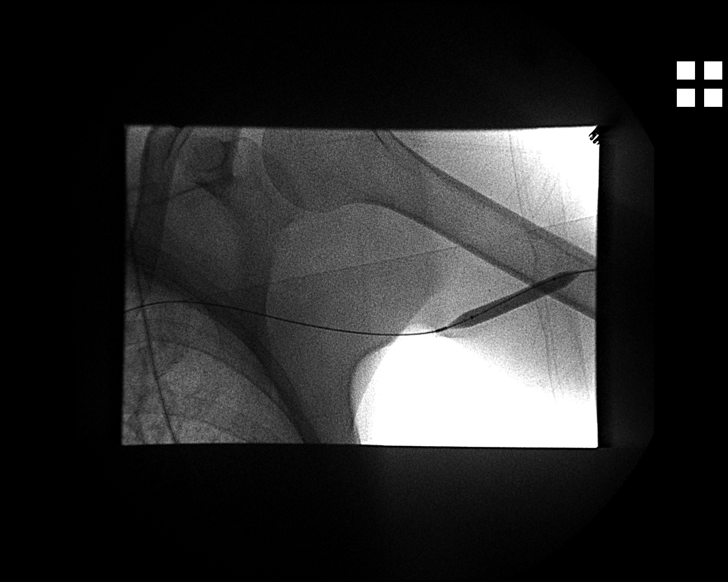
[im 8/30]
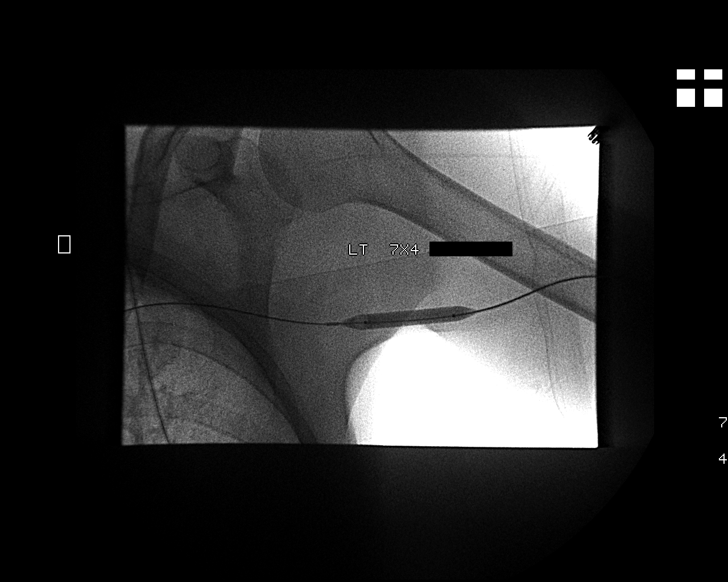
[im 11/30]
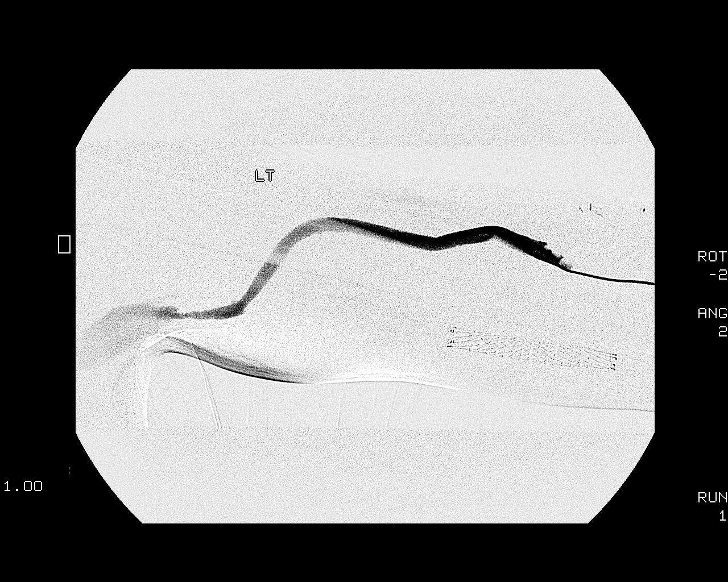
[im 13/30]
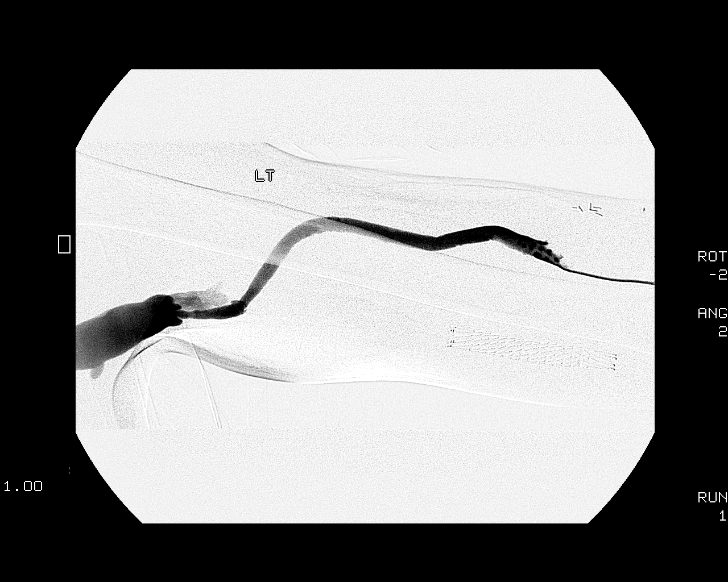
[im 16/30]
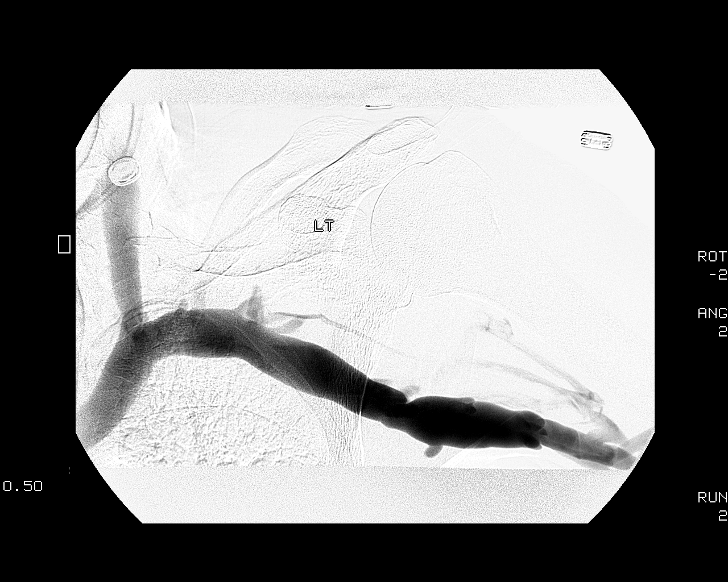
[im 17/30]
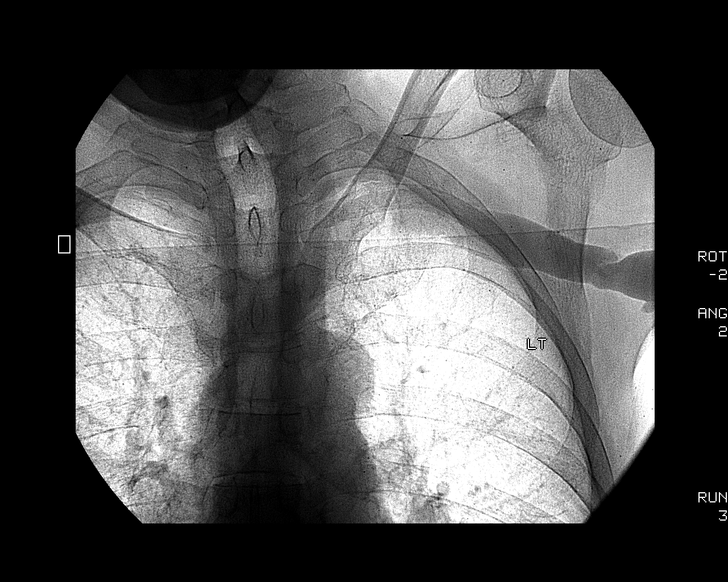
[im 19/30]
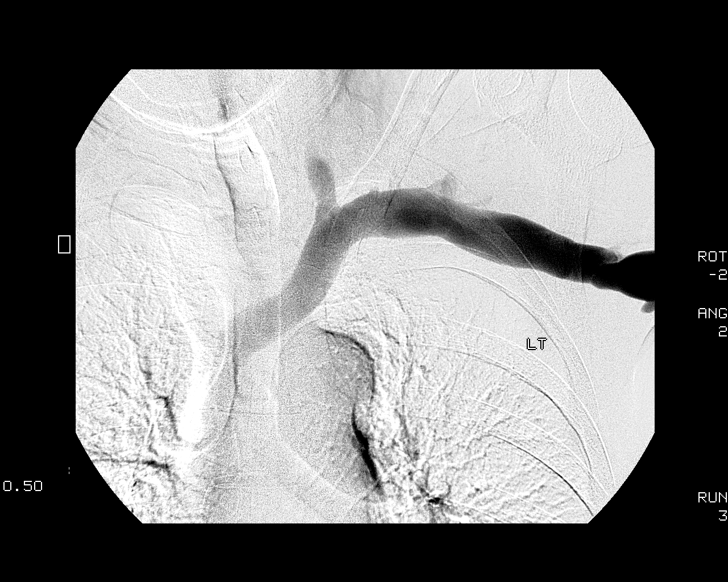
[im 22/30]
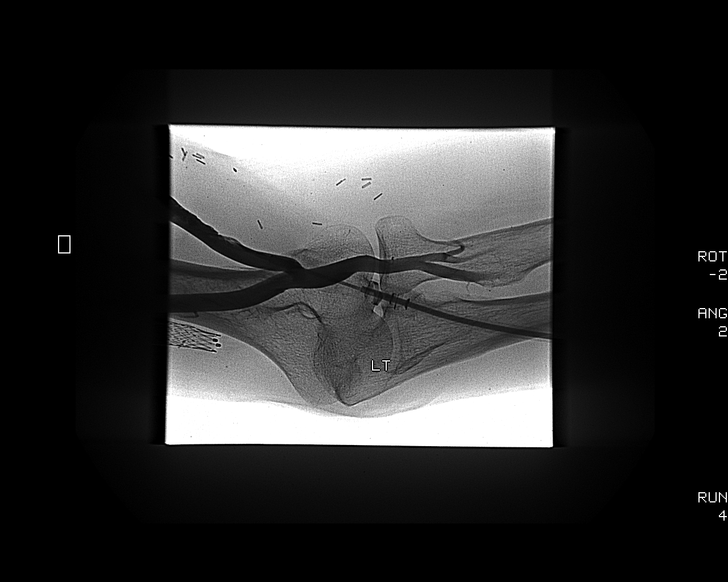
[im 24/30]
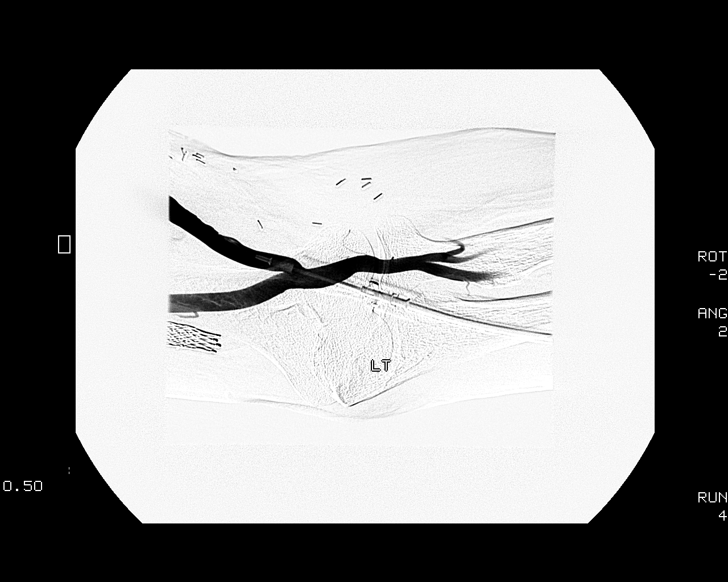
[im 27/30]
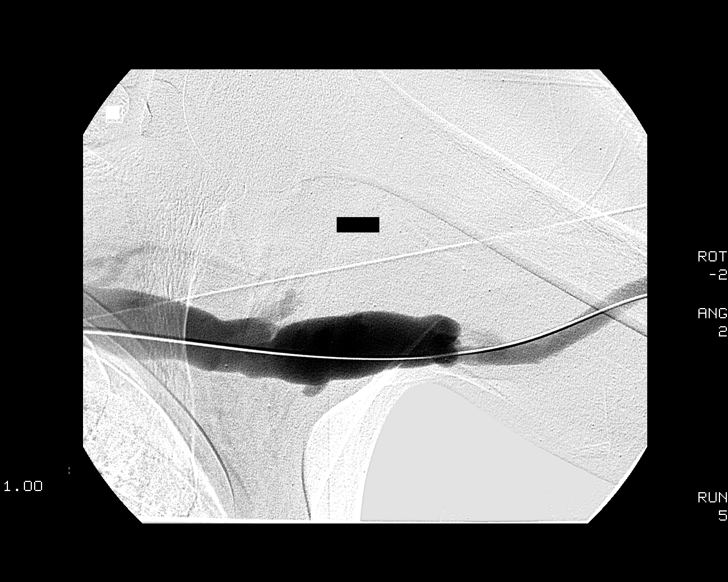
[im 30/30]
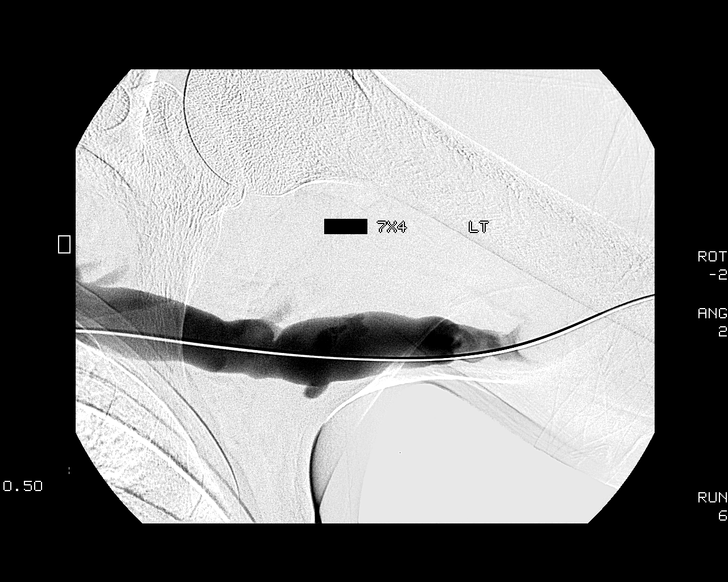

[13 of 24 positions shown; findings below may reference images not displayed]

FINDINGS: There is tapered 75% diameter stenosis at the venous
anastomosis.  The more central native venous outflow through the
SVC is widely patent.  Arterial anastomosis with the brachial
artery above the elbow is widely patent.

The venous anastomotic stenosis responded readily to 7 mm balloon
angioplasty.  Follow-up venography   shows no residual or recurrent
stenosis, extravasation, dissection, or other apparent
complication.  The patient tolerated the procedure well.

IMPRESSION

1.  Venous anastomotic stenosis, with good response to 7 mm balloon
angioplasty.

Access management: Remains approachable for percutaneous
intervention as needed.

## 2011-10-17 MED ORDER — PANCRELIPASE (LIP-PROT-AMYL) 12000-38000 UNITS PO CPEP: 1.0000 | ORAL_CAPSULE | Freq: Three times a day (TID) | ORAL | Status: DC

## 2011-10-17 NOTE — Telephone Encounter (Signed)
Med changed to Creon 12.  New rx sent to pharmacy.  Thank you!

## 2011-10-17 NOTE — Telephone Encounter (Signed)
Pharmacy called lipram is not made any more, you will need to change to ZINPEP 10 or CREON 12. Please send electronically to pharmacy listed

## 2011-10-21 ENCOUNTER — Encounter: Payer: Self-pay | Admitting: Gastroenterology

## 2011-10-23 ENCOUNTER — Encounter (HOSPITAL_COMMUNITY): Payer: Self-pay | Admitting: Emergency Medicine

## 2011-10-23 ENCOUNTER — Other Ambulatory Visit: Payer: Self-pay

## 2011-10-23 ENCOUNTER — Inpatient Hospital Stay (HOSPITAL_COMMUNITY)
Admission: EM | Admit: 2011-10-23 | Discharge: 2011-10-30 | DRG: 438 | Disposition: A | Discharge status: Home / Self Care | Payer: Medicare Other | Attending: Internal Medicine | Admitting: Internal Medicine

## 2011-10-23 DIAGNOSIS — N186 End stage renal disease: Secondary | ICD-10-CM | POA: Diagnosis present

## 2011-10-23 DIAGNOSIS — J4489 Other specified chronic obstructive pulmonary disease: Secondary | ICD-10-CM | POA: Diagnosis present

## 2011-10-23 DIAGNOSIS — F329 Major depressive disorder, single episode, unspecified: Secondary | ICD-10-CM | POA: Diagnosis present

## 2011-10-23 DIAGNOSIS — Z79899 Other long term (current) drug therapy: Secondary | ICD-10-CM

## 2011-10-23 DIAGNOSIS — K861 Other chronic pancreatitis: Secondary | ICD-10-CM

## 2011-10-23 DIAGNOSIS — B1920 Unspecified viral hepatitis C without hepatic coma: Secondary | ICD-10-CM | POA: Diagnosis present

## 2011-10-23 DIAGNOSIS — R1084 Generalized abdominal pain: Secondary | ICD-10-CM | POA: Diagnosis present

## 2011-10-23 DIAGNOSIS — K859 Acute pancreatitis without necrosis or infection, unspecified: Principal | ICD-10-CM | POA: Diagnosis present

## 2011-10-23 DIAGNOSIS — Z91199 Patient's noncompliance with other medical treatment and regimen due to unspecified reason: Secondary | ICD-10-CM

## 2011-10-23 DIAGNOSIS — F3289 Other specified depressive episodes: Secondary | ICD-10-CM | POA: Diagnosis present

## 2011-10-23 DIAGNOSIS — J449 Chronic obstructive pulmonary disease, unspecified: Secondary | ICD-10-CM | POA: Diagnosis present

## 2011-10-23 DIAGNOSIS — Z9119 Patient's noncompliance with other medical treatment and regimen: Secondary | ICD-10-CM

## 2011-10-23 DIAGNOSIS — R110 Nausea: Secondary | ICD-10-CM

## 2011-10-23 DIAGNOSIS — I120 Hypertensive chronic kidney disease with stage 5 chronic kidney disease or end stage renal disease: Secondary | ICD-10-CM | POA: Diagnosis present

## 2011-10-23 DIAGNOSIS — E872 Acidosis: Secondary | ICD-10-CM | POA: Diagnosis present

## 2011-10-23 DIAGNOSIS — M109 Gout, unspecified: Secondary | ICD-10-CM | POA: Diagnosis present

## 2011-10-23 DIAGNOSIS — F1910 Other psychoactive substance abuse, uncomplicated: Secondary | ICD-10-CM | POA: Diagnosis present

## 2011-10-23 DIAGNOSIS — F17200 Nicotine dependence, unspecified, uncomplicated: Secondary | ICD-10-CM | POA: Diagnosis present

## 2011-10-23 DIAGNOSIS — E871 Hypo-osmolality and hyponatremia: Secondary | ICD-10-CM | POA: Diagnosis present

## 2011-10-23 DIAGNOSIS — J441 Chronic obstructive pulmonary disease with (acute) exacerbation: Secondary | ICD-10-CM

## 2011-10-23 DIAGNOSIS — F10239 Alcohol dependence with withdrawal, unspecified: Secondary | ICD-10-CM | POA: Diagnosis not present

## 2011-10-23 DIAGNOSIS — D61818 Other pancytopenia: Secondary | ICD-10-CM | POA: Diagnosis present

## 2011-10-23 DIAGNOSIS — IMO0002 Reserved for concepts with insufficient information to code with codable children: Secondary | ICD-10-CM | POA: Diagnosis not present

## 2011-10-23 DIAGNOSIS — K279 Peptic ulcer, site unspecified, unspecified as acute or chronic, without hemorrhage or perforation: Secondary | ICD-10-CM | POA: Diagnosis present

## 2011-10-23 DIAGNOSIS — I10 Essential (primary) hypertension: Secondary | ICD-10-CM

## 2011-10-23 DIAGNOSIS — F1020 Alcohol dependence, uncomplicated: Secondary | ICD-10-CM | POA: Diagnosis present

## 2011-10-23 DIAGNOSIS — D649 Anemia, unspecified: Secondary | ICD-10-CM | POA: Diagnosis present

## 2011-10-23 DIAGNOSIS — F10939 Alcohol use, unspecified with withdrawal, unspecified: Secondary | ICD-10-CM | POA: Diagnosis present

## 2011-10-23 DIAGNOSIS — Z880 Allergy status to penicillin: Secondary | ICD-10-CM

## 2011-10-23 HISTORY — DX: Peptic ulcer, site unspecified, unspecified as acute or chronic, without hemorrhage or perforation: K27.9

## 2011-10-23 LAB — DIFFERENTIAL
Eosinophils Absolute: 0.1 10*3/uL (ref 0.0–0.7)
Eosinophils Relative: 3 % (ref 0–5)
Lymphs Abs: 2.3 10*3/uL (ref 0.7–4.0)
Monocytes Absolute: 0.2 10*3/uL (ref 0.1–1.0)
Monocytes Relative: 6 % (ref 3–12)

## 2011-10-23 LAB — CBC
HCT: 29.6 % — ABNORMAL LOW (ref 39.0–52.0)
MCH: 31.3 pg (ref 26.0–34.0)
MCV: 92.8 fL (ref 78.0–100.0)
Platelets: 120 10*3/uL — ABNORMAL LOW (ref 150–400)
RBC: 3.19 MIL/uL — ABNORMAL LOW (ref 4.22–5.81)

## 2011-10-23 MED ORDER — HYDROMORPHONE HCL PF 1 MG/ML IJ SOLN
1.0000 mg | Freq: Once | INTRAMUSCULAR | Status: AC
  Administered 2011-10-23: 1 mg via INTRAVENOUS
  Filled 2011-10-23: qty 1

## 2011-10-23 MED ORDER — ALBUTEROL SULFATE (5 MG/ML) 0.5% IN NEBU
INHALATION_SOLUTION | RESPIRATORY_TRACT | Status: AC
  Administered 2011-10-23: 5 mg
  Filled 2011-10-23: qty 1

## 2011-10-23 MED ORDER — ONDANSETRON HCL 4 MG/2ML IJ SOLN
4.0000 mg | Freq: Once | INTRAMUSCULAR | Status: AC
  Administered 2011-10-23: 4 mg via INTRAVENOUS
  Filled 2011-10-23: qty 2

## 2011-10-23 MED ORDER — SODIUM CHLORIDE 0.9 % IV SOLN
INTRAVENOUS | Status: DC
  Administered 2011-10-23: via INTRAVENOUS

## 2011-10-23 MED ORDER — FOLIC ACID 1 MG PO TABS
1.0000 mg | ORAL_TABLET | ORAL | Status: AC
  Administered 2011-10-23: 1 mg via ORAL
  Filled 2011-10-23: qty 1

## 2011-10-23 MED ORDER — THIAMINE HCL 100 MG/ML IJ SOLN
100.0000 mg | Freq: Once | INTRAMUSCULAR | Status: AC
  Administered 2011-10-23: 100 mg via INTRAVENOUS
  Filled 2011-10-23: qty 2

## 2011-10-23 MED ORDER — PREDNISONE 20 MG PO TABS
60.0000 mg | ORAL_TABLET | Freq: Once | ORAL | Status: AC
  Administered 2011-10-23: 60 mg via ORAL
  Filled 2011-10-23: qty 3

## 2011-10-23 MED ORDER — ALBUTEROL SULFATE (5 MG/ML) 0.5% IN NEBU
5.0000 mg | INHALATION_SOLUTION | Freq: Once | RESPIRATORY_TRACT | Status: AC
  Administered 2011-10-23: 5 mg via RESPIRATORY_TRACT
  Filled 2011-10-23: qty 1

## 2011-10-23 NOTE — ED Notes (Signed)
Old and new EKG handed to Dr. Brooke Dare.  Extra copy placed in pt chart

## 2011-10-23 NOTE — ED Notes (Signed)
Acute onset of shortness of breath and abdominal pain after waking up from sleep.  Patient with increased work of breathing.  History of pancreatitis, dialysis patient.

## 2011-10-23 NOTE — ED Notes (Signed)
Conni Slipper (daughter-in-law) [phone]
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
## 2011-10-24 ENCOUNTER — Inpatient Hospital Stay (HOSPITAL_COMMUNITY): Payer: Medicare Other

## 2011-10-24 ENCOUNTER — Encounter (HOSPITAL_COMMUNITY): Payer: Self-pay | Admitting: Internal Medicine

## 2011-10-24 ENCOUNTER — Emergency Department (HOSPITAL_COMMUNITY): Payer: Medicare Other

## 2011-10-24 DIAGNOSIS — F10239 Alcohol dependence with withdrawal, unspecified: Secondary | ICD-10-CM

## 2011-10-24 DIAGNOSIS — IMO0002 Reserved for concepts with insufficient information to code with codable children: Secondary | ICD-10-CM

## 2011-10-24 DIAGNOSIS — F1020 Alcohol dependence, uncomplicated: Secondary | ICD-10-CM

## 2011-10-24 DIAGNOSIS — K861 Other chronic pancreatitis: Secondary | ICD-10-CM

## 2011-10-24 LAB — POCT I-STAT TROPONIN I: Troponin i, poc: 0 ng/mL (ref 0.00–0.08)

## 2011-10-24 LAB — GLUCOSE, CAPILLARY: Glucose-Capillary: 75 mg/dL (ref 70–99)

## 2011-10-24 LAB — URINALYSIS, ROUTINE W REFLEX MICROSCOPIC
Bilirubin Urine: NEGATIVE
Glucose, UA: 100 mg/dL — AB
Ketones, ur: NEGATIVE mg/dL
Leukocytes, UA: NEGATIVE
Nitrite: NEGATIVE
Protein, ur: >300 mg/dL — AB
Specific Gravity, Urine: 1.008 (ref 1.005–1.030)
Urobilinogen, UA: 0.2 mg/dL (ref 0.0–1.0)
pH: 8 (ref 5.0–8.0)

## 2011-10-24 LAB — COMPREHENSIVE METABOLIC PANEL
BUN: 44 mg/dL — ABNORMAL HIGH (ref 6–23)
CO2: 21 mEq/L (ref 19–32)
Calcium: 8.8 mg/dL (ref 8.4–10.5)
Creatinine, Ser: 9.27 mg/dL — ABNORMAL HIGH (ref 0.50–1.35)
GFR calc Af Amer: 6 mL/min — ABNORMAL LOW (ref >90)
GFR calc non Af Amer: 6 mL/min — ABNORMAL LOW (ref >90)
Glucose, Bld: 78 mg/dL (ref 70–99)
Sodium: 125 mEq/L — ABNORMAL LOW (ref 135–145)
Total Protein: 7.5 g/dL (ref 6.0–8.3)

## 2011-10-24 LAB — LACTIC ACID, PLASMA: Lactic Acid, Venous: 2 mmol/L (ref 0.5–2.2)

## 2011-10-24 LAB — LIPASE, BLOOD: Lipase: 80 U/L — ABNORMAL HIGH (ref 11–59)

## 2011-10-24 LAB — BASIC METABOLIC PANEL
BUN: 47 mg/dL — ABNORMAL HIGH (ref 6–23)
CO2: 21 mEq/L (ref 19–32)
Chloride: 85 mEq/L — ABNORMAL LOW (ref 96–112)
GFR calc Af Amer: 6 mL/min — ABNORMAL LOW (ref >90)
Potassium: 4.5 mEq/L (ref 3.5–5.1)

## 2011-10-24 LAB — PHOSPHORUS: Phosphorus: 5.7 mg/dL — ABNORMAL HIGH (ref 2.3–4.6)

## 2011-10-24 LAB — URINE MICROSCOPIC-ADD ON

## 2011-10-24 LAB — MAGNESIUM: Magnesium: 2.2 mg/dL (ref 1.5–2.5)

## 2011-10-24 LAB — ETHANOL: Alcohol, Ethyl (B): <11 mg/dL (ref 0–11)

## 2011-10-24 MED ORDER — ALBUTEROL SULFATE (5 MG/ML) 0.5% IN NEBU
2.5000 mg | INHALATION_SOLUTION | Freq: Four times a day (QID) | RESPIRATORY_TRACT | Status: DC
  Administered 2011-10-24 – 2011-10-27 (×10): 2.5 mg via RESPIRATORY_TRACT
  Filled 2011-10-24 (×12): qty 0.5

## 2011-10-24 MED ORDER — DARBEPOETIN ALFA-POLYSORBATE 25 MCG/0.42ML IJ SOLN
25.0000 ug | Freq: Once | INTRAMUSCULAR | Status: AC
  Administered 2011-10-28: 25 ug via INTRAVENOUS
  Filled 2011-10-24: qty 0.42

## 2011-10-24 MED ORDER — GABAPENTIN 300 MG PO CAPS
300.0000 mg | ORAL_CAPSULE | Freq: Three times a day (TID) | ORAL | Status: DC
  Administered 2011-10-24 – 2011-10-30 (×13): 300 mg via ORAL
  Filled 2011-10-24 (×21): qty 1

## 2011-10-24 MED ORDER — CALCIUM ACETATE 667 MG PO CAPS
667.0000 mg | ORAL_CAPSULE | Freq: Three times a day (TID) | ORAL | Status: DC
  Administered 2011-10-25 – 2011-10-30 (×10): 667 mg via ORAL
  Filled 2011-10-24 (×22): qty 1

## 2011-10-24 MED ORDER — HYDROMORPHONE HCL PF 1 MG/ML IJ SOLN
INTRAMUSCULAR | Status: AC
  Administered 2011-10-24: 1.5 mg via INTRAVENOUS
  Filled 2011-10-24: qty 2

## 2011-10-24 MED ORDER — HYDROMORPHONE HCL PF 1 MG/ML IJ SOLN
1.0000 mg | INTRAMUSCULAR | Status: DC | PRN
  Administered 2011-10-24 (×4): 1 mg via INTRAVENOUS
  Filled 2011-10-24 (×4): qty 1

## 2011-10-24 MED ORDER — TIOTROPIUM BROMIDE MONOHYDRATE 18 MCG IN CAPS
18.0000 ug | ORAL_CAPSULE | Freq: Every day | RESPIRATORY_TRACT | Status: DC
  Administered 2011-10-25 – 2011-10-28 (×4): 18 ug via RESPIRATORY_TRACT
  Filled 2011-10-24 (×2): qty 5

## 2011-10-24 MED ORDER — DARBEPOETIN ALFA-POLYSORBATE 25 MCG/0.42ML IJ SOLN
INTRAMUSCULAR | Status: AC
  Administered 2011-10-24: 25 ug
  Filled 2011-10-24: qty 0.42

## 2011-10-24 MED ORDER — HYDROMORPHONE HCL PF 1 MG/ML IJ SOLN
1.5000 mg | INTRAMUSCULAR | Status: DC | PRN
  Administered 2011-10-24 – 2011-10-25 (×6): 1.5 mg via INTRAVENOUS
  Filled 2011-10-24 (×5): qty 2

## 2011-10-24 MED ORDER — PANTOPRAZOLE SODIUM 40 MG PO TBEC
40.0000 mg | DELAYED_RELEASE_TABLET | Freq: Every day | ORAL | Status: DC
  Administered 2011-10-24 – 2011-10-25 (×2): 40 mg via ORAL
  Filled 2011-10-24 (×2): qty 1

## 2011-10-24 MED ORDER — COLCHICINE 0.6 MG PO TABS
0.6000 mg | ORAL_TABLET | Freq: Every day | ORAL | Status: DC
  Administered 2011-10-24 – 2011-10-25 (×2): 0.6 mg via ORAL
  Filled 2011-10-24 (×4): qty 1

## 2011-10-24 MED ORDER — LISINOPRIL 10 MG PO TABS
10.0000 mg | ORAL_TABLET | Freq: Every day | ORAL | Status: DC
  Administered 2011-10-24: 10 mg via ORAL
  Filled 2011-10-24: qty 1

## 2011-10-24 MED ORDER — HYDROXYZINE HCL 25 MG PO TABS
25.0000 mg | ORAL_TABLET | Freq: Three times a day (TID) | ORAL | Status: DC | PRN
  Filled 2011-10-24: qty 1

## 2011-10-24 MED ORDER — HEPARIN SODIUM (PORCINE) 1000 UNIT/ML DIALYSIS
1000.0000 [IU] | INTRAMUSCULAR | Status: DC | PRN
  Filled 2011-10-24: qty 1

## 2011-10-24 MED ORDER — VITAMIN B-1 100 MG PO TABS
100.0000 mg | ORAL_TABLET | Freq: Every day | ORAL | Status: DC
  Administered 2011-10-24: 100 mg via ORAL
  Filled 2011-10-24: qty 1

## 2011-10-24 MED ORDER — LIDOCAINE-PRILOCAINE 2.5-2.5 % EX CREA
1.0000 "application " | TOPICAL_CREAM | CUTANEOUS | Status: DC | PRN
  Filled 2011-10-24: qty 5

## 2011-10-24 MED ORDER — LANTHANUM CARBONATE 500 MG PO CHEW
1000.0000 mg | CHEWABLE_TABLET | Freq: Three times a day (TID) | ORAL | Status: DC
  Administered 2011-10-25 – 2011-10-30 (×10): 1000 mg via ORAL
  Filled 2011-10-24 (×20): qty 2

## 2011-10-24 MED ORDER — ALBUTEROL SULFATE (5 MG/ML) 0.5% IN NEBU
5.0000 mg | INHALATION_SOLUTION | Freq: Once | RESPIRATORY_TRACT | Status: AC
  Administered 2011-10-24: 5 mg via RESPIRATORY_TRACT
  Filled 2011-10-24 (×2): qty 1

## 2011-10-24 MED ORDER — DOCUSATE SODIUM 283 MG RE ENEM
1.0000 | ENEMA | RECTAL | Status: DC | PRN
  Filled 2011-10-24: qty 1

## 2011-10-24 MED ORDER — SODIUM CHLORIDE 0.9 % IV SOLN: 100.0000 mL | INTRAVENOUS | Status: DC | PRN

## 2011-10-24 MED ORDER — HEPARIN SODIUM (PORCINE) 5000 UNIT/ML IJ SOLN
5000.0000 [IU] | Freq: Three times a day (TID) | INTRAMUSCULAR | Status: DC
  Administered 2011-10-24 – 2011-10-30 (×16): 5000 [IU] via SUBCUTANEOUS
  Filled 2011-10-24 (×22): qty 1

## 2011-10-24 MED ORDER — LORAZEPAM 1 MG PO TABS
1.0000 mg | ORAL_TABLET | Freq: Four times a day (QID) | ORAL | Status: DC | PRN
  Administered 2011-10-25: 1 mg via ORAL
  Filled 2011-10-24: qty 1

## 2011-10-24 MED ORDER — DARBEPOETIN ALFA-POLYSORBATE 25 MCG/0.42ML IJ SOLN
25.0000 ug | INTRAMUSCULAR | Status: DC
  Administered 2011-10-30: 25 ug via INTRAVENOUS
  Filled 2011-10-24: qty 0.42

## 2011-10-24 MED ORDER — CLONIDINE HCL 0.3 MG PO TABS
0.3000 mg | ORAL_TABLET | Freq: Two times a day (BID) | ORAL | Status: DC
  Administered 2011-10-24 – 2011-10-30 (×12): 0.3 mg via ORAL
  Filled 2011-10-24 (×15): qty 1

## 2011-10-24 MED ORDER — ONDANSETRON HCL 4 MG/2ML IJ SOLN: 4.0000 mg | Freq: Four times a day (QID) | INTRAMUSCULAR | Status: DC | PRN

## 2011-10-24 MED ORDER — LORAZEPAM 2 MG/ML IJ SOLN
1.0000 mg | Freq: Four times a day (QID) | INTRAMUSCULAR | Status: DC | PRN
  Administered 2011-10-24 – 2011-10-25 (×4): 1 mg via INTRAVENOUS
  Filled 2011-10-24 (×4): qty 1

## 2011-10-24 MED ORDER — HYDROMORPHONE HCL PF 1 MG/ML IJ SOLN
INTRAMUSCULAR | Status: AC
  Filled 2011-10-24: qty 2

## 2011-10-24 MED ORDER — LIDOCAINE HCL (PF) 1 % IJ SOLN
5.0000 mL | INTRAMUSCULAR | Status: DC | PRN
  Filled 2011-10-24: qty 5

## 2011-10-24 MED ORDER — ZOLPIDEM TARTRATE 5 MG PO TABS: 5.0000 mg | ORAL_TABLET | Freq: Every evening | ORAL | Status: DC | PRN

## 2011-10-24 MED ORDER — HYDROMORPHONE HCL PF 1 MG/ML IJ SOLN
1.0000 mg | Freq: Once | INTRAMUSCULAR | Status: AC
  Administered 2011-10-24: 1 mg via INTRAVENOUS
  Filled 2011-10-24: qty 1

## 2011-10-24 MED ORDER — HEPARIN SODIUM (PORCINE) 1000 UNIT/ML DIALYSIS
2000.0000 [IU] | INTRAMUSCULAR | Status: DC | PRN
  Filled 2011-10-24: qty 2

## 2011-10-24 MED ORDER — CALCIUM CARBONATE-VITAMIN D 500-200 MG-UNIT PO TABS
1.0000 | ORAL_TABLET | Freq: Two times a day (BID) | ORAL | Status: DC
  Administered 2011-10-24: 1 via ORAL
  Filled 2011-10-24 (×3): qty 1

## 2011-10-24 MED ORDER — SORBITOL 70 % SOLN
30.0000 mL | Status: DC | PRN
  Filled 2011-10-24: qty 30

## 2011-10-24 MED ORDER — ACETAMINOPHEN 650 MG RE SUPP: 650.0000 mg | Freq: Four times a day (QID) | RECTAL | Status: DC | PRN

## 2011-10-24 MED ORDER — PARICALCITOL 5 MCG/ML IV SOLN
INTRAVENOUS | Status: AC
  Administered 2011-10-24: 4 ug via INTRAVENOUS
  Filled 2011-10-24: qty 1

## 2011-10-24 MED ORDER — ALTEPLASE 2 MG IJ SOLR: 2.0000 mg | Freq: Once | INTRAMUSCULAR | Status: AC | PRN

## 2011-10-24 MED ORDER — CALCIUM CARBONATE 1250 MG/5ML PO SUSP
500.0000 mg | Freq: Four times a day (QID) | ORAL | Status: DC | PRN
  Filled 2011-10-24: qty 5

## 2011-10-24 MED ORDER — HEPARIN SODIUM (PORCINE) 1000 UNIT/ML DIALYSIS: 2000.0000 [IU] | INTRAMUSCULAR | Status: DC | PRN

## 2011-10-24 MED ORDER — PARICALCITOL 5 MCG/ML IV SOLN
4.0000 ug | INTRAVENOUS | Status: DC
  Administered 2011-10-26 – 2011-10-30 (×3): 4 ug via INTRAVENOUS
  Filled 2011-10-24 (×3): qty 0.8

## 2011-10-24 MED ORDER — CAMPHOR-MENTHOL 0.5-0.5 % EX LOTN
1.0000 "application " | TOPICAL_LOTION | Freq: Three times a day (TID) | CUTANEOUS | Status: DC | PRN
  Filled 2011-10-24: qty 222

## 2011-10-24 MED ORDER — ONDANSETRON HCL 4 MG/2ML IJ SOLN
4.0000 mg | Freq: Four times a day (QID) | INTRAMUSCULAR | Status: DC | PRN
  Administered 2011-10-25 (×2): 4 mg via INTRAVENOUS
  Filled 2011-10-24 (×2): qty 2

## 2011-10-24 MED ORDER — PENTAFLUOROPROP-TETRAFLUOROETH EX AERO
1.0000 "application " | INHALATION_SPRAY | CUTANEOUS | Status: DC | PRN
  Administered 2011-10-28: 1 via TOPICAL

## 2011-10-24 MED ORDER — LISINOPRIL 10 MG PO TABS
10.0000 mg | ORAL_TABLET | Freq: Every day | ORAL | Status: DC
  Administered 2011-10-25 – 2011-10-29 (×4): 10 mg via ORAL
  Filled 2011-10-24 (×6): qty 1

## 2011-10-24 MED ORDER — SODIUM CHLORIDE 0.9 % IV SOLN
INTRAVENOUS | Status: AC
  Administered 2011-10-24: 04:00:00 via INTRAVENOUS

## 2011-10-24 MED ORDER — ONDANSETRON HCL 4 MG PO TABS: 4.0000 mg | ORAL_TABLET | Freq: Four times a day (QID) | ORAL | Status: DC | PRN

## 2011-10-24 MED ORDER — PARICALCITOL 5 MCG/ML IV SOLN
4.0000 ug | Freq: Once | INTRAVENOUS | Status: AC
  Administered 2011-10-24: 4 ug via INTRAVENOUS
  Filled 2011-10-24: qty 0.8

## 2011-10-24 MED ORDER — LORAZEPAM 2 MG/ML IJ SOLN
INTRAMUSCULAR | Status: AC
  Administered 2011-10-24: 1 mg via INTRAVENOUS
  Filled 2011-10-24: qty 1

## 2011-10-24 MED ORDER — RENA-VITE PO TABS
1.0000 | ORAL_TABLET | Freq: Every day | ORAL | Status: DC
  Administered 2011-10-24 – 2011-10-29 (×3): 1 via ORAL
  Filled 2011-10-24 (×7): qty 1

## 2011-10-24 MED ORDER — NEPRO/CARBSTEADY PO LIQD
237.0000 mL | Freq: Three times a day (TID) | ORAL | Status: DC | PRN
  Filled 2011-10-24: qty 237

## 2011-10-24 MED ORDER — ALBUTEROL SULFATE (5 MG/ML) 0.5% IN NEBU
INHALATION_SOLUTION | RESPIRATORY_TRACT | Status: AC
  Filled 2011-10-24: qty 0.5

## 2011-10-24 MED ORDER — PANCRELIPASE (LIP-PROT-AMYL) 12000-38000 UNITS PO CPEP
1.0000 | ORAL_CAPSULE | Freq: Three times a day (TID) | ORAL | Status: DC
  Administered 2011-10-24 – 2011-10-30 (×13): 1 via ORAL
  Filled 2011-10-24 (×22): qty 1

## 2011-10-24 MED ORDER — INFLUENZA VIRUS VACC SPLIT PF IM SUSP
0.5000 mL | INTRAMUSCULAR | Status: AC
  Administered 2011-10-25: 0.5 mL via INTRAMUSCULAR
  Filled 2011-10-24: qty 0.5

## 2011-10-24 MED ORDER — ACETAMINOPHEN 325 MG PO TABS
650.0000 mg | ORAL_TABLET | Freq: Four times a day (QID) | ORAL | Status: DC | PRN
  Administered 2011-10-25 – 2011-10-29 (×6): 650 mg via ORAL
  Filled 2011-10-24 (×5): qty 2

## 2011-10-24 MED ORDER — FOLIC ACID 1 MG PO TABS
1.0000 mg | ORAL_TABLET | Freq: Every day | ORAL | Status: DC
  Administered 2011-10-24: 1 mg via ORAL
  Filled 2011-10-24: qty 1

## 2011-10-24 MED ORDER — HYDROMORPHONE HCL PF 1 MG/ML IJ SOLN
INTRAMUSCULAR | Status: AC
  Filled 2011-10-24: qty 1

## 2011-10-24 MED ORDER — BIOTENE DRY MOUTH MT LIQD
15.0000 mL | Freq: Two times a day (BID) | OROMUCOSAL | Status: DC
  Administered 2011-10-24 – 2011-10-29 (×7): 15 mL via OROMUCOSAL

## 2011-10-24 MED ORDER — PNEUMOCOCCAL VAC POLYVALENT 25 MCG/0.5ML IJ INJ
0.5000 mL | INJECTION | INTRAMUSCULAR | Status: AC
  Administered 2011-10-25: 0.5 mL via INTRAMUSCULAR
  Filled 2011-10-24: qty 0.5
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
## 2011-10-24 NOTE — H&P (Signed)
Please see Dr Denton Ar H&P for further details.  [name] is a 56 yo with dx of chronic pancreatitis although most recent CTs show no stigmata of chronic pancreatitis. Pt states that the abd pain has come on gradually for a few days but excalated sig on night prior to admit and woke him up from sleep. This is his typical pancreatitis flare. He had two episodes of emesis. He is allowed sips of water to take pills and states that it aggravates his pain.  Pt missed yesterday's HD session but has no volume overload sxs.   He denies ETOH use but his ethanol level was elevated on admit.   On exam he is tremulous but the tremors cease when pt concentrates on serial 7's or remembering date and year. The tremor is in his hands and face but not his feet.  ABD : voluntary guarding, + BS all quadrants, No rebound tenderness EXT: warm, no edema Neuro : A&O x 3, did great with serial 7's. No focal.  Lipase elevated at 80 Other labs reviewed CXR reviewed  A/P 1. Acute pancreatitis - Pt is on Creon for exocrine dysfxn but I cannot say that he has chronic pancreatitis as I would expected some abnl on imaging and all CT's have been nl. Regardless, his lipase is elevated and his pain is typical for pancreatitis. ETOH use was the likely culprit. IV opioids while NPO. Follow exam and pain level.  2. ESRD - renal has seen pt and plan on HD today since missed yesterday and a session tomorrow.  3. Tremor - This is not c/w ETOH withdrawal. But agree with CIWA as pt did have ETOH on admit and h/o mild withdrawal sxs.  4. Elevated gap without acidosis - increase in cations (phos) and decrease in anions (chloride). Maybe some RTA also?? This has been chronic for him and stable. His gap is likely falsely low as his Na is low.  5. Hyponatremia - check urine osm and Na to assess.   6. Dispo - pt states usually takes 3-4 days for acute pancreatitis to resolve.

## 2011-10-24 NOTE — Procedures (Signed)
I was present at this session.  I have reviewed the session itself and made appropriate changes.  Will not use Vna with low SNa.   Shayon Trompeter L 3/21/20133:48 PM
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
## 2011-10-24 NOTE — ED Notes (Signed)
Patient complaining of pain, writhing on stretcher and moaning and rubbing abdomen.  MD notified.  New orders per Dr Brooke Dare.

## 2011-10-24 NOTE — ED Notes (Signed)
Pt given urinal and asked to give sample

## 2011-10-24 NOTE — Progress Notes (Signed)
Subjective: Patient still complaining of significant pain, with some relief from pain medications and warm/cold compress; I was called to bedside for concern of CIWA score >33.  No vomiting since hospitalization, only a few times at home  Objective: Vital signs in last 24 hours: Filed Vitals:   10/24/11 0145 10/24/11 0300 10/24/11 0600 10/24/11 0900  BP: 152/89 161/94  158/89  Pulse:  95 84 80  Temp:  98.2 F (36.8 C)    TempSrc:  Oral    Resp: 20 20    Height:  6' (1.829 m)    Weight:  171 lb 1.2 oz (77.6 kg)    SpO2: 98% 94%  99%   Weight change:   Intake/Output Summary (Last 24 hours) at 10/24/11 1009 Last data filed at 10/24/11 0252  Gross per 24 hour  Intake    500 ml  Output      0 ml  Net    500 ml   Physical Exam: Vital signs reveiwed.  General: UE shaking while guarding his abdomen HEENT: PERRL, EOMI, no scleral icterus Cardiac: RRR, no rubs, murmurs or gallops Pulm: clear to auscultation bilaterally, moving normal volumes of air Abd: soft, no rebound tenderness, voluntary guarding, diffusely tender (worse on right side), normoactive BS Ext: warm and well perfused, no pedal edema Neuro: alert and oriented X3, cranial nerves II-XII grossly intact, patient able to subtract serial 7s from 100 (and shaking stops when he is thinking); +tremor with finger to nose, but no LE shaking; patient able to stand without difficulty  Lab Results: Basic Metabolic Panel:  Lab 10/24/11 1610 10/24/11 0220 10/22/11 2330  NA 127* -- 125*  K 4.5 -- 4.4  CL 85* -- 84*  CO2 21 -- 21  GLUCOSE 94 -- 78  BUN 47* -- 44*  CREATININE 9.49* -- 9.27*  CALCIUM 8.4 -- 8.8  MG -- 2.2 --  PHOS -- 5.7* --   Liver Function Tests:  Lab 10/22/11 2330  AST 42*  ALT 39  ALKPHOS 63  BILITOT 0.3  PROT 7.5  ALBUMIN 3.4*    Lab 10/22/11 2330  LIPASE 80*  AMYLASE --   CBC:  Lab 10/22/11 2330  WBC 4.0  NEUTROABS 1.4*  HGB 10.0*  HCT 29.6*  MCV 92.8  PLT 120*   Urine Drug  Screen: Drugs of Abuse     Component Value Date/Time   LABOPIA NEG 09/27/2010 1551   COCAINSCRNUR NEG 09/27/2010 1551   COCAINSCRNUR NONE DETECTED 09/03/2010 1852   LABBENZ NEG 09/27/2010 1551   LABBENZ POSITIVE* 09/03/2010 1852   AMPHETMU NEG 09/27/2010 1551   AMPHETMU NONE DETECTED 09/03/2010 1852   THCU NONE DETECTED 09/03/2010 1852   LABBARB  Value: NONE DETECTED        DRUG SCREEN FOR MEDICAL PURPOSES ONLY.  IF CONFIRMATION IS NEEDED FOR ANY PURPOSE, NOTIFY LAB WITHIN 5 DAYS.        LOWEST DETECTABLE LIMITS FOR URINE DRUG SCREEN Drug Class       Cutoff (ng/mL) Amphetamine      1000 Barbiturate      200 Benzodiazepine   200 Tricyclics       300 Opiates          300 Cocaine          300 THC              50 09/03/2010 1852    Alcohol Level:  Lab 10/24/11 0220  ETH 172*   Urinalysis:  Lab 10/24/11  0206  COLORURINE YELLOW  LABSPEC 1.008  PHURINE 8.0  GLUCOSEU 100*  HGBUR TRACE*  BILIRUBINUR NEGATIVE  KETONESUR NEGATIVE  PROTEINUR >300*  UROBILINOGEN 0.2  NITRITE NEGATIVE  LEUKOCYTESUR NEGATIVE   Studies/Results: Dg Chest 2 View  10/24/2011  *RADIOLOGY REPORT*  Clinical Data: Epigastric pain.  History of pancreatitis  CHEST - 2 VIEW  Comparison: 02/00 pain/13  Findings: Heart size appears normal.  There is no pleural effusion or edema.  Increased lung volumes and coarsened interstitial markings are noted similar to previous exam.  No focal airspace consolidation identified.  IMPRESSION:  1.  No acute cardiopulmonary abnormalities. 2.  Hyperinflation and coarsened interstitial markings suggests COPD.  Original Report Authenticated By: Rosealee Albee, M.D.   Medications: I have reviewed the patient's current medications. Scheduled Meds:   . albuterol  2.5 mg Nebulization Q6H  . albuterol  5 mg Nebulization Once  . albuterol  5 mg Nebulization Once  . albuterol      . antiseptic oral rinse  15 mL Mouth Rinse BID  . calcium acetate  667 mg Oral TID WC  . calcium-vitamin D  1 tablet  Oral BID WC  . cloNIDine  0.3 mg Oral BID  . colchicine  0.6 mg Oral Daily  . folic acid  1 mg Oral To Major  . folic acid  1 mg Oral Daily  . gabapentin  300 mg Oral TID  . heparin  5,000 Units Subcutaneous Q8H  .  HYDROmorphone (DILAUDID) injection  1 mg Intravenous Once  .  HYDROmorphone (DILAUDID) injection  1 mg Intravenous Once  .  HYDROmorphone (DILAUDID) injection  1 mg Intravenous Once  . influenza  inactive virus vaccine  0.5 mL Intramuscular Tomorrow-1000  . lipase/protease/amylase  1 capsule Oral TID AC  . lisinopril  10 mg Oral Daily  . multivitamin  1 tablet Oral Daily  . ondansetron  4 mg Intravenous Once  . pantoprazole  40 mg Oral Q1200  . pneumococcal 23 valent vaccine  0.5 mL Intramuscular Tomorrow-1000  . predniSONE  60 mg Oral Once  . thiamine  100 mg Intravenous Once  . thiamine  100 mg Oral Daily  . tiotropium  18 mcg Inhalation Daily   Continuous Infusions:   . sodium chloride 50 mL/hr at 10/24/11 0405  . DISCONTD: sodium chloride 125 mL/hr at 10/23/11 2346   PRN Meds:.HYDROmorphone, LORazepam, LORazepam, ondansetron (ZOFRAN) IV, ondansetron, zolpidem  Assessment/Plan:  #Acute pancreatitis: Patient seems to be having persistent pain, which is causing increased tremors/shaking/agitation.  I was called to bedside for concern of EtOH withdrawal and CIWA >30.  No vomiting, and nausea is 2/2 pancreatitis, sweats are 2/2 pain (his vitals, labs and physical exam do not suggest further acute pathology such as ruptured pancreas).  New c/o HA likely due to hunger.  No c/o anxiety, nor does patient appear anxious.  No tactile, auditory or visual disturbances.  He is oriented x3 and able to appropriately converse.  No acute abdomen on physical exam.  Though he denies EtOH abuse, his blood level is elevated, and this is likely contributing to his pancreatitis. Review of prior CT scan from 04/03/11 reveals normal pancreas, so uncertain about chronic pancreatitis.  -continue  NPO, continue pain mgmt with dilaudid 1mg  q2h (hesitent to increase dose given renal failure and h/o hepatitis), but please call for increased pain, may consider giving break through dose -continue gentle IVF started at admission (50cc/h x 10h) -plan to restart creon-pancrease once diet starts  #  Elevated Anion Gap: AG 21 this morning, with normal bicarb; EtOH contributing, continue to monitor bmet q12h  #Hyponatremia: likely 2/2 intravascular volume depletion -add on urine sodium and urine osm (he produced uring for UA), follow BMET  #ESRD: MWF schedule, plan for HD tomorrow, renal has been consulted  #COPD: no active issues, albuterol q6h, spiriva  #Anemia: near baseline, 2/2 ESRD, treated with aranesp  #HTN: mild-mod elevated, continue clonidine & lisinopril  #h/o EtOH abuse: though patient continues to deny EtOH abuse, his BAL was elevated -CIWA, will repeat EtOH (add on)  #VTE ppx: heparin    LOS: 1 day   [name], [name] 10/24/2011, 10:09 AM

## 2011-10-24 NOTE — ED Notes (Signed)
OPC in with patient at this time.

## 2011-10-25 DIAGNOSIS — F10939 Alcohol use, unspecified with withdrawal, unspecified: Secondary | ICD-10-CM | POA: Diagnosis not present

## 2011-10-25 DIAGNOSIS — F10239 Alcohol dependence with withdrawal, unspecified: Secondary | ICD-10-CM | POA: Diagnosis not present

## 2011-10-25 LAB — CBC
HCT: 31.4 % — ABNORMAL LOW (ref 39.0–52.0)
Hemoglobin: 10.3 g/dL — ABNORMAL LOW (ref 13.0–17.0)
MCH: 31.9 pg (ref 26.0–34.0)
MCHC: 32.8 g/dL (ref 30.0–36.0)
Platelets: 105 10*3/uL — ABNORMAL LOW (ref 150–400)
RBC: 3.23 MIL/uL — ABNORMAL LOW (ref 4.22–5.81)
WBC: 3.3 10*3/uL — ABNORMAL LOW (ref 4.0–10.5)

## 2011-10-25 LAB — BASIC METABOLIC PANEL
CO2: 25 mEq/L (ref 19–32)
Calcium: 8.8 mg/dL (ref 8.4–10.5)
GFR calc non Af Amer: 11 mL/min — ABNORMAL LOW (ref >90)
Potassium: 4 mEq/L (ref 3.5–5.1)
Sodium: 133 mEq/L — ABNORMAL LOW (ref 135–145)

## 2011-10-25 MED ORDER — DIAZEPAM 5 MG PO TABS
5.0000 mg | ORAL_TABLET | Freq: Four times a day (QID) | ORAL | Status: DC | PRN
  Administered 2011-10-26: 5 mg via ORAL

## 2011-10-25 MED ORDER — HYDROMORPHONE HCL PF 1 MG/ML IJ SOLN
1.0000 mg | INTRAMUSCULAR | Status: DC | PRN
  Administered 2011-10-25 – 2011-10-28 (×19): 1 mg via INTRAVENOUS
  Filled 2011-10-25 (×18): qty 1

## 2011-10-25 MED ORDER — LORAZEPAM 2 MG/ML IJ SOLN
2.0000 mg | INTRAMUSCULAR | Status: DC | PRN
  Administered 2011-10-25 – 2011-10-28 (×11): 2 mg via INTRAVENOUS
  Filled 2011-10-25 (×2): qty 1
  Filled 2011-10-25: qty 2
  Filled 2011-10-25 (×8): qty 1

## 2011-10-25 MED ORDER — DIAZEPAM 5 MG PO TABS
10.0000 mg | ORAL_TABLET | Freq: Four times a day (QID) | ORAL | Status: DC
  Filled 2011-10-25: qty 1

## 2011-10-25 NOTE — Progress Notes (Signed)
[facility] ROUNDING NOTE   Subjective:   Interval History: none.  Objective:  Vital signs in last 24 hours:  Temp:  [97.8 F (36.6 C)-99 F (37.2 C)] 97.9 F (36.6 C) (03/22 0754) Pulse Rate:  [63-86] 63  (03/22 0754) Resp:  [8-22] 20  (03/22 0754) BP: (136-201)/(70-99) 166/84 mmHg (03/22 0754) SpO2:  [92 %-99 %] 97 % (03/22 0824) Weight:  [71.7 kg (158 lb 1.1 oz)-76.2 kg (167 lb 15.9 oz)] 71.7 kg (158 lb 1.1 oz) (03/21 1934)  Weight change: -1.4 kg (-3 lb 1.4 oz) Filed Weights   10/24/11 0300 10/24/11 1509 10/24/11 1934  Weight: 77.6 kg (171 lb 1.2 oz) 76.2 kg (167 lb 15.9 oz) 71.7 kg (158 lb 1.1 oz)    Intake/Output: I/O last 3 completed shifts: In: 500 [I.V.:500] Out: 4620 [Urine:600; Other:4020]   Intake/Output this shift:     General: restless, no acute distress  HEENT: atraumatic, normocephalic. Palatal lesion,  Eyes: sclera nonicteric  Neck: supple  Heart: RRR  Lungs: diminished bases, prolonged expiratory phase, otherwise clear  Abdomen: soft, diffuse tenderness, ND, +BS  Extremities: trace BLE edema  Skin: intact (few old healing abrasions)  Neuro: Alert, little confused but easily re-oriented  Dialysis Access: LUA AVG, +T/B    Basic Metabolic Panel:  Lab 10/25/11 2130 10/24/11 0322 10/24/11 0220 10/22/11 2330  NA 133* 127* -- 125*  K 4.0 4.5 -- 4.4  CL 93* 85* -- 84*  CO2 25 21 -- 21  GLUCOSE 89 94 -- 78  BUN 24* 47* -- 44*  CREATININE 5.41* 9.49* -- 9.27*  CALCIUM 8.8 8.4 -- 8.8  MG -- -- 2.2 --  PHOS -- -- 5.7* --    Liver Function Tests:  Lab 10/22/11 2330  AST 42*  ALT 39  ALKPHOS 63  BILITOT 0.3  PROT 7.5  ALBUMIN 3.4*    Lab 10/22/11 2330  LIPASE 80*  AMYLASE --   No results found for this basename: AMMONIA:3 in the last 168 hours  CBC:  Lab 10/25/11 0535 10/22/11 2330  WBC 3.3* 4.0  NEUTROABS -- 1.4*  HGB 10.3* 10.0*  HCT 31.4* 29.6*  MCV 97.2 92.8  PLT 105* 120*    Cardiac Enzymes: No results  found for this basename: CKTOTAL:5,CKMB:5,CKMBINDEX:5,TROPONINI:5 in the last 168 hours  BNP: No components found with this basename: POCBNP:5  CBG:  Lab 10/24/11 2027  GLUCAP 75    Microbiology: Results for orders placed during the hospital encounter of 10/23/11  MRSA PCR SCREENING     Status: Normal   Collection Time   10/24/11  3:48 AM      Component Value Range Status Comment   MRSA by PCR NEGATIVE  NEGATIVE  Final     Coagulation Studies: No results found for this basename: LABPROT:5,INR:5 in the last 72 hours  Urinalysis:  Basename 10/24/11 0206  COLORURINE YELLOW  LABSPEC 1.008  PHURINE 8.0  GLUCOSEU 100*  HGBUR TRACE*  BILIRUBINUR NEGATIVE  KETONESUR NEGATIVE  PROTEINUR >300*  UROBILINOGEN 0.2  NITRITE NEGATIVE  LEUKOCYTESUR NEGATIVE      Imaging: Dg Chest 2 View  10/24/2011  *RADIOLOGY REPORT*  Clinical Data: Epigastric pain.  History of pancreatitis  CHEST - 2 VIEW  Comparison: 02/00 pain/13  Findings: Heart size appears normal.  There is no pleural effusion or edema.  Increased lung volumes and coarsened interstitial markings are noted similar to previous exam.  No focal airspace consolidation identified.  IMPRESSION:  1.  No acute cardiopulmonary  abnormalities. 2.  Hyperinflation and coarsened interstitial markings suggests COPD.  Original Report Authenticated By: Rosealee Albee, M.D.     Medications:      . sodium chloride 50 mL/hr at 10/24/11 0405      . albuterol  2.5 mg Nebulization Q6H  . albuterol      . antiseptic oral rinse  15 mL Mouth Rinse BID  . calcium acetate  667 mg Oral TID WC  . cloNIDine  0.3 mg Oral BID  . colchicine  0.6 mg Oral Daily  . darbepoetin      . darbepoetin (ARANESP) injection - DIALYSIS  25 mcg Intravenous Once  . darbepoetin (ARANESP) injection - DIALYSIS  25 mcg Intravenous Q Wed-HD  . gabapentin  300 mg Oral TID  . heparin  5,000 Units Subcutaneous Q8H  . influenza  inactive virus vaccine  0.5 mL  Intramuscular Tomorrow-1000  . lanthanum  1,000 mg Oral TID WC  . lipase/protease/amylase  1 capsule Oral TID AC  . lisinopril  10 mg Oral QHS  . multivitamin  1 tablet Oral Daily  . pantoprazole  40 mg Oral Q1200  . paricalcitol  4 mcg Intravenous Once in dialysis  . paricalcitol  4 mcg Intravenous Q M,W,F-HD  . pneumococcal 23 valent vaccine  0.5 mL Intramuscular Tomorrow-1000  . tiotropium  18 mcg Inhalation Daily  . DISCONTD: calcium-vitamin D  1 tablet Oral BID WC  . DISCONTD: folic acid  1 mg Oral Daily  . DISCONTD: lisinopril  10 mg Oral Daily  . DISCONTD: thiamine  100 mg Oral Daily   acetaminophen, acetaminophen, alteplase, calcium carbonate (dosed in mg elemental calcium), camphor-menthol, docusate sodium, feeding supplement (NEPRO CARB STEADY), heparin, heparin, HYDROmorphone, hydrOXYzine, lidocaine, lidocaine-prilocaine, LORazepam, LORazepam, ondansetron (ZOFRAN) IV, ondansetron, pentafluoroprop-tetrafluoroeth, sorbitol, zolpidem, DISCONTD: sodium chloride, DISCONTD: sodium chloride, DISCONTD: heparin DISCONTD: HYDROmorphone, DISCONTD: HYDROmorphone, DISCONTD: ondansetron (ZOFRAN) IV, DISCONTD: ondansetron  Assessment/ Plan:  1. Pancreatitis-stable 2. Polysubstance abuse- no alcohol 3. Tremors- suspect related to ETOH; improved with prn Ativan  4. ESRD - MWF, [facility]  5. Hypertension/volume -stable 7. Anemia - Hgb 10.3  8. Metabolic bone disease - phos 5.7, ca 8.4, on phoslo, Fosrenol, and Zemplar outpt 9. Nutrition - albumin 3.4 10. Medical Noncompliance- stressed compliance  11. Depression/Polysubstance abuse/Alcohol dependence- on folic acid and thiamine; has participated in outpt rehab, AA, and other programs; encouraged ongoing support  12. COPD- as noted CXR; encouraged smoking cessation  13. Disposition- per primary services  14. Palatal lesion needs eval ? bx  Plan dialysis today   LOS: 2 Jaylena Holloway W @TODAY @10 :31 AM

## 2011-10-25 NOTE — Progress Notes (Signed)
Internal Medicine Teaching Service Attending Note Date: 10/25/2011  Patient name: NAPOLEAN SIA  Medical record number: [id number]  Date of birth: March 17, 1956    This patient has been seen and discussed with the house staff. Please see their note for complete details. I concur with their findings with the following additions/corrections:  Mr Lipscomb is lying in bed in NAD. He states his pain is now a five, decreased from 10 on admit. He did not sleep well. On exam, he has no tremors. His ABD has + BS, vol guarding, no tenderness to palpation with stethoscope not hand. For his acute pancreatitis, we will try some clears. If able to tolerate PO, we will change to PO opioids. He may have mild withdrawal sxs but his tremors are not DT related. Anticipate D/C 1-2 days if able to tolerate PO.   Velora Horstman 10/25/2011, 1:00 PM

## 2011-10-25 NOTE — Progress Notes (Signed)
Subjective: No acute events overnight.  Tolerated HD well yesterday.  He reports an episode of vomiting last evening, but night RN reports no vomiting (nor has any emesis been recorded in doc flowsheets), still c/o abdominal pain; patient was sleeping without tremors, and started shaking when I woke him.  11mg  dilaudid in last 24h.   Objective: Vital signs in last 24 hours: Filed Vitals:   10/24/11 2033 10/24/11 2353 10/25/11 0303 10/25/11 0400  BP: 193/97 201/99  179/90  Pulse: 86 76  69  Temp: 98.2 F (36.8 C) 98.9 F (37.2 C)  97.8 F (36.6 C)  TempSrc: Oral Oral  Oral  Resp: 20 20  20   Height:      Weight:      SpO2: 92% 96% 99% 98%   Weight change: -3 lb 1.4 oz (-1.4 kg)  Intake/Output Summary (Last 24 hours) at 10/25/11 0736 Last data filed at 10/25/11 0426  Gross per 24 hour  Intake      0 ml  Output   4620 ml  Net  -4620 ml   Physical Exam: Vital signs reveiwed. Hypertensive. General: resting in bed, NAD, cooperative to exam HEENT: PERRL, EOMI, no scleral icterus Cardiac: RRR, no rubs, murmurs or gallops Pulm: clear to auscultation bilaterally, moving normal volumes of air Abd: soft, not significantly tender to palpation, nondistended, BS normoactive Ext: warm and well perfused, no pedal edema Neuro: alert and oriented X3, cranial nerves II-XII grossly intact  Lab Results: Basic Metabolic Panel:  Lab 10/25/11 1478 10/24/11 0322 10/24/11 0220  NA 133* 127* --  K 4.0 4.5 --  CL 93* 85* --  CO2 25 21 --  GLUCOSE 89 94 --  BUN 24* 47* --  CREATININE 5.41* 9.49* --  CALCIUM 8.8 8.4 --  MG -- -- 2.2  PHOS -- -- 5.7*   Liver Function Tests:  Lab 10/22/11 2330  AST 42*  ALT 39  ALKPHOS 63  BILITOT 0.3  PROT 7.5  ALBUMIN 3.4*    Lab 10/22/11 2330  LIPASE 80*  AMYLASE --   CBC:  Lab 10/25/11 0535 10/22/11 2330  WBC 3.3* 4.0  NEUTROABS -- 1.4*  HGB 10.3* 10.0*  HCT 31.4* 29.6*  MCV 97.2 92.8  PLT 105* 120*   CBG:  Lab 10/24/11 2027  GLUCAP  75     Alcohol Level:  Lab 10/24/11 1128 10/24/11 0220  ETH <11 172*   Studies/Results: Dg Chest 2 View  10/24/2011  *RADIOLOGY REPORT*  Clinical Data: Epigastric pain.  History of pancreatitis  CHEST - 2 VIEW  Comparison: 02/00 pain/13  Findings: Heart size appears normal.  There is no pleural effusion or edema.  Increased lung volumes and coarsened interstitial markings are noted similar to previous exam.  No focal airspace consolidation identified.  IMPRESSION:  1.  No acute cardiopulmonary abnormalities. 2.  Hyperinflation and coarsened interstitial markings suggests COPD.  Original Report Authenticated By: Rosealee Albee, M.D.   Medications: I have reviewed the patient's current medications. Scheduled Meds:   . albuterol  2.5 mg Nebulization Q6H  . albuterol      . antiseptic oral rinse  15 mL Mouth Rinse BID  . calcium acetate  667 mg Oral TID WC  . cloNIDine  0.3 mg Oral BID  . colchicine  0.6 mg Oral Daily  . darbepoetin      . darbepoetin (ARANESP) injection - DIALYSIS  25 mcg Intravenous Once  . darbepoetin (ARANESP) injection - DIALYSIS  25 mcg Intravenous  Q Wed-HD  . gabapentin  300 mg Oral TID  . heparin  5,000 Units Subcutaneous Q8H  . influenza  inactive virus vaccine  0.5 mL Intramuscular Tomorrow-1000  . lanthanum  1,000 mg Oral TID WC  . lipase/protease/amylase  1 capsule Oral TID AC  . lisinopril  10 mg Oral QHS  . multivitamin  1 tablet Oral Daily  . pantoprazole  40 mg Oral Q1200  . paricalcitol  4 mcg Intravenous Once in dialysis  . paricalcitol  4 mcg Intravenous Q M,W,F-HD  . pneumococcal 23 valent vaccine  0.5 mL Intramuscular Tomorrow-1000  . tiotropium  18 mcg Inhalation Daily  . DISCONTD: calcium-vitamin D  1 tablet Oral BID WC  . DISCONTD: folic acid  1 mg Oral Daily  . DISCONTD: lisinopril  10 mg Oral Daily  . DISCONTD: thiamine  100 mg Oral Daily   Continuous Infusions:   . sodium chloride 50 mL/hr at 10/24/11 0405   PRN  Meds:.acetaminophen, acetaminophen, alteplase, calcium carbonate (dosed in mg elemental calcium), camphor-menthol, docusate sodium, feeding supplement (NEPRO CARB STEADY), heparin, heparin, HYDROmorphone, hydrOXYzine, lidocaine, lidocaine-prilocaine, LORazepam, LORazepam, ondansetron (ZOFRAN) IV, ondansetron, pentafluoroprop-tetrafluoroeth, sorbitol, zolpidem, DISCONTD: sodium chloride, DISCONTD: sodium chloride, DISCONTD: heparin DISCONTD: HYDROmorphone, DISCONTD: HYDROmorphone, DISCONTD: ondansetron (ZOFRAN) IV, DISCONTD: ondansetron  Assessment/Plan:  #Acute pancreatitis (on chronic): Pain improved, likely due to EtOH -trial of clear liquids -decrease pain meds to dialaudid 1mg   #Elevated AG: improved to 15 this morning after HD, bicarb wnl -monitor AM bmet  #Hyponatremia: likely 2/2 ESRD, improved with HD  #ESRD: MWF schedule, pt had HD yesterday because apparently he had not been dialyzed since Monday, renal following; HD today  #COPD: no active issues, continue albuterol & spiriva  #Chronic Normocytic Anemia: at baseline; 2/2 ESRD, treated with aranesp  #HTN: signifcantly elevated at 4am, improved later in the day, though was elevated as high as 201/99 last evening; patient treated with clonidine & lisinopril; fluid status managed with HD, continue to monitor  #h/o EtOH abuse: repeat BAL <11; I discussed with renal NP on day of admission who sees him at HD, and this was likely a binge drinking occurrence   #VTE ppx: heparin TID  #Dispo: d/c home in 1-2d pending clinical improvement   LOS: 2 days   KAPADIA, Keishana Klinger 10/25/2011, 7:36 AM

## 2011-10-25 NOTE — Progress Notes (Signed)
Night Float Progress Note:  S: Called to bedside by RN who observed pt acting more confused and having more pain.  Pt says he is having some increased abdominal pain, HA, mild anxiety, and is feeling like he might be starting to withdraw.  He is embarrassed to admit it.  O: Filed Vitals:   10/25/11 2000  BP: 196/100  Pulse: 112  Temp: 101.7 F (38.7 C)  Resp: 22  GEN: NAD, not tremulous, appropriate HEENT: pea sized, flesh colored, palatal lesion on R palate observed CV: tachycardic, 2/6 murmur Chest: CTAB ABD: TTP in RUQ, unchanged from prior examination Psych: A&Ox3, acting appropriately, denies AH/VH  A: Pt currently looks okay at bedside and is acting appropriate. However, definite concern for active withdrawal given tachycardia, hypertension, fever, and report of walking out of room and saying he "needed to go to class."  No intervention between that episode and our evaluation, so not sure why he is relatively well appearing on exam.  No tremors, but pt has h/o DT's and says he feels as though he is heading down that route.  Additionally, having had EtoH on board on admission, pt is now in window for withdrawal.  Has not gotten aggressive benzos during the day.  Alternatively, fever and AMS could be 2/2 infection in the setting of abdominal pain and chronic pancreatitis.  Given the likelihood and h/o withdrawal, we will get more aggressive with tx and monitoring for this patient.  If he con't to worsen, we will consider alternative strategies including CT scan of the abdomen and/or empiric antibiotics.  P: - ativan 2mg  IV q2h PRN - valium 10mg  PO q6h x 4 doses, then 5mg  q6H PRN - transfer to step down (not sure if bed will become available) - consider CT and/or abx if worsens

## 2011-10-26 ENCOUNTER — Inpatient Hospital Stay (HOSPITAL_COMMUNITY): Payer: Medicare Other

## 2011-10-26 LAB — BASIC METABOLIC PANEL
Calcium: 8.5 mg/dL (ref 8.4–10.5)
Chloride: 94 mEq/L — ABNORMAL LOW (ref 96–112)
Creatinine, Ser: 7.65 mg/dL — ABNORMAL HIGH (ref 0.50–1.35)
Sodium: 131 mEq/L — ABNORMAL LOW (ref 135–145)

## 2011-10-26 LAB — CBC
HCT: 29.4 % — ABNORMAL LOW (ref 39.0–52.0)
Hemoglobin: 9.6 g/dL — ABNORMAL LOW (ref 13.0–17.0)
MCH: 31.7 pg (ref 26.0–34.0)
MCHC: 32.7 g/dL (ref 30.0–36.0)
MCV: 97 fL (ref 78.0–100.0)
RDW: 15.9 % — ABNORMAL HIGH (ref 11.5–15.5)

## 2011-10-26 MED ORDER — CYCLOBENZAPRINE HCL 10 MG PO TABS
5.0000 mg | ORAL_TABLET | Freq: Three times a day (TID) | ORAL | Status: DC | PRN
  Administered 2011-10-26: 5 mg via ORAL
  Filled 2011-10-26 (×2): qty 1

## 2011-10-26 MED ORDER — ACETAMINOPHEN 325 MG PO TABS
ORAL_TABLET | ORAL | Status: AC
  Filled 2011-10-26: qty 2

## 2011-10-26 MED ORDER — HYDROMORPHONE HCL PF 1 MG/ML IJ SOLN
INTRAMUSCULAR | Status: AC
  Filled 2011-10-26: qty 1

## 2011-10-26 MED ORDER — PARICALCITOL 5 MCG/ML IV SOLN
INTRAVENOUS | Status: AC
  Filled 2011-10-26: qty 1

## 2011-10-26 MED ORDER — DOXYCYCLINE HYCLATE 100 MG PO TABS
100.0000 mg | ORAL_TABLET | Freq: Two times a day (BID) | ORAL | Status: DC
  Administered 2011-10-26: 100 mg via ORAL
  Filled 2011-10-26 (×3): qty 1

## 2011-10-26 MED ORDER — LORAZEPAM 2 MG/ML IJ SOLN
INTRAMUSCULAR | Status: AC
  Administered 2011-10-26: 2 mg via INTRAVENOUS
  Filled 2011-10-26: qty 1

## 2011-10-26 NOTE — Progress Notes (Signed)
Patient arrived from dialysis unit with c/o severe right shoulder pain. Assessment reveals a 13 cm x 12 cm erythematous area that is extremely tender and warm to touch. He also c/o increased pain with movement. Temp elevated.  Tylenol given previously by dialysis staff.  Dilaudid 1 mg IV given for pain given after IV access re-established as both previous PIVs were nonfunctional upon arrival to unit.  Internal medicine advised of findings.

## 2011-10-26 NOTE — Progress Notes (Signed)
Subjective: Patient  Spiked a fever of 101.7 last night that was associated with tachycardia and HTN and was tremuluous on exam. As per the nursing report patient was also confused and for his symptoms consistent with alcohol withdrawal being in the window period, he was transferred to SDU and was given valium with PRN Ativan.  This morning when I saw the patient, he was still tremulous and was getting HD. He was also complaining of abdominal discomfort and vomiting.   Objective: Vital signs in last 24 hours: Filed Vitals:   10/26/11 0200 10/26/11 0300 10/26/11 0400 10/26/11 0500  BP: 143/77 132/98    Pulse: 83 81    Temp:  100.6 F (38.1 C)    TempSrc:      Resp: 11 16 21 17   Height:      Weight:      SpO2: 98% 100% 95%    Weight change:   Intake/Output Summary (Last 24 hours) at 10/26/11 0713 Last data filed at 10/25/11 1700  Gross per 24 hour  Intake      0 ml  Output      0 ml  Net      0 ml   Physical Exam: Vital signs reveiwed. Hypertensive. General: resting in bed, NAD, cooperative to exam HEENT: PERRL, EOMI, no scleral icterus Cardiac: RRR, no rubs, murmurs or gallops Pulm: clear to auscultation bilaterally, moving normal volumes of air Abd: soft, not significantly tender to palpation, nondistended, BS normoactive Ext: warm and well perfused, no pedal edema Neuro: alert and oriented X3, cranial nerves II-XII grossly intact  Lab Results: Basic Metabolic Panel:  Lab 10/25/11 1610 10/24/11 0322 10/24/11 0220  NA 133* 127* --  K 4.0 4.5 --  CL 93* 85* --  CO2 25 21 --  GLUCOSE 89 94 --  BUN 24* 47* --  CREATININE 5.41* 9.49* --  CALCIUM 8.8 8.4 --  MG -- -- 2.2  PHOS -- -- 5.7*   Liver Function Tests:  Lab 10/22/11 2330  AST 42*  ALT 39  ALKPHOS 63  BILITOT 0.3  PROT 7.5  ALBUMIN 3.4*    Lab 10/22/11 2330  LIPASE 80*  AMYLASE --   CBC:  Lab 10/25/11 0535 10/22/11 2330  WBC 3.3* 4.0  NEUTROABS -- 1.4*  HGB 10.3* 10.0*  HCT 31.4* 29.6*  MCV  97.2 92.8  PLT 105* 120*   CBG:  Lab 10/24/11 2027  GLUCAP 75     Alcohol Level:  Lab 10/24/11 1128 10/24/11 0220  ETH <11 172*   Studies/Results: No results found. Medications: I have reviewed the patient's current medications. Scheduled Meds:    . albuterol  2.5 mg Nebulization Q6H  . antiseptic oral rinse  15 mL Mouth Rinse BID  . calcium acetate  667 mg Oral TID WC  . cloNIDine  0.3 mg Oral BID  . colchicine  0.6 mg Oral Daily  . darbepoetin (ARANESP) injection - DIALYSIS  25 mcg Intravenous Once  . darbepoetin (ARANESP) injection - DIALYSIS  25 mcg Intravenous Q Wed-HD  . diazepam  10 mg Oral Q6H  . gabapentin  300 mg Oral TID  . heparin  5,000 Units Subcutaneous Q8H  . influenza  inactive virus vaccine  0.5 mL Intramuscular Tomorrow-1000  . lanthanum  1,000 mg Oral TID WC  . lipase/protease/amylase  1 capsule Oral TID AC  . lisinopril  10 mg Oral QHS  . multivitamin  1 tablet Oral Daily  . pantoprazole  40 mg Oral  Q1200  . paricalcitol  4 mcg Intravenous Q M,W,F-HD  . pneumococcal 23 valent vaccine  0.5 mL Intramuscular Tomorrow-1000  . tiotropium  18 mcg Inhalation Daily   Continuous Infusions:  PRN Meds:.acetaminophen, acetaminophen, calcium carbonate (dosed in mg elemental calcium), camphor-menthol, diazepam, docusate sodium, feeding supplement (NEPRO CARB STEADY), heparin, heparin, HYDROmorphone, hydrOXYzine, lidocaine, lidocaine-prilocaine, LORazepam, ondansetron (ZOFRAN) IV, ondansetron, pentafluoroprop-tetrafluoroeth, sorbitol, zolpidem, DISCONTD: LORazepam, DISCONTD: LORazepam  Assessment/Plan:  # Alcohol withdrawal: Patient spiked a fever of 101.7 associated with HTN, tachycardia and was in the window for alcohol withdrawal . He was tremulous on exam today .  - Continue ativan and Valium on current PRN schedule based on CIWA protocol. - continue to monitor in SDU  For his fever, other differentials also include necrotizing pancreatitis and sepsis but  based on the exam and lab results, they are less likely on the differential. Blood cultures and urine cultures were drawn at the time of fever which are still pending.   #Acute pancreatitis (on chronic): Pain improved, likely due to EtOH. He states that he could not tolerate clears yesterday very well -NPO today -decrease pain meds to dialaudid 1mg    #Hyponatremia: likely 2/2 ESRD, improved with HD  #ESRD: MWF schedule, pt was getting HD today.  #COPD: no active issues, continue albuterol & spiriva  #Chronic Normocytic Anemia: at baseline; 2/2 ESRD, treated with aranesp  #HTN: elevated likely 2/2 withdrawal. Continue home meds for now.   #VTE ppx: heparin TID  #Dispo: d/c home in 1-2d pending clinical improvement   LOS: 3 days   [name] 10/26/2011, 7:13 AM

## 2011-10-26 NOTE — Progress Notes (Signed)
PT STARTED GETTING RESTLESS 2 HRS INTO HD TX, PRIMARY NURSE SAID THAT A SITTER WAS REQUESTED. PROVIDED ONE-ON ONE CARE, PT WAS PROVIDED WITH ANTI-ANXIETY MEDS, PAIN MEDS, EMOTIONAL SUPPORT, REASSURANCE, AND HE CONSTANTLY TRIED TO PULL AVG NEEDLES, CONTINUOS CLOSE OBSERVATION AND REMINDEDR TO KEEP HA ACCESS UNCOVERED, SAT WITH PT FOR 1/2 OF TX FOR SAFETY AND SUPPORT. PT HAD A TEMP AT END OF TX, TYLENOL GIVEN. ALSO, C/O ABD PAIN THROUGHTOUT TX, MEDS FOR PAIN ADMINISTERED. BUT AT END OF TX PT. C/O PAIN ON RIGHT ARM, NOTICED A RED SPOT ON SHOULDER, HOT, AND THE SIZE OF AN ORANGE. BOTH IV SITES OCCLUDED WHEN FLUSHED. PRIMARY NURSE WAS AWARE OF IV WRIST OCCLUDED. WHEN GIVEN REPORT TO DANA, RN NOTIFIED HER OF THESE FINDINGS. NURSE TO CALL PRIMARY PHYSICIAN.

## 2011-10-26 NOTE — Progress Notes (Signed)
[facility] ROUNDING NOTE   Subjective:   Interval History: Pt became confused and is now on ATivan protocol for ETOH withdrawal.  He is very lethargic, not answering questions today.  He is on HD, held over from yesterday.  Objective:  Vital signs in last 24 hours:  Temp:  [97.8 F (36.6 C)-101.7 F (38.7 C)] 98.1 F (36.7 C) (03/23 0830) Pulse Rate:  [72-112] 92  (03/23 1230) Resp:  [11-22] 16  (03/23 1230) BP: (132-196)/(69-100) 171/95 mmHg (03/23 1230) SpO2:  [93 %-100 %] 94 % (03/23 0830) Weight:  [72.8 kg (160 lb 7.9 oz)] 72.8 kg (160 lb 7.9 oz) (03/23 0830)  Weight change:  Filed Weights   10/24/11 1509 10/24/11 1934 10/26/11 0830  Weight: 76.2 kg (167 lb 15.9 oz) 71.7 kg (158 lb 1.1 oz) 72.8 kg (160 lb 7.9 oz)    Intake/Output: I/O last 3 completed shifts: In: 0  Out: 4620 [Urine:600; Other:4020]   Intake/Output this shift:     General: somnolent, responding with occ grunts, verbalizations HEENT: atraumatic, normocephalic  Eyes: sclera nonicteric  Neck: supple  Heart: RRR  Lungs: diminished bases, prolonged expiratory phase, otherwise clear  Abdomen: soft, diffuse tenderness, ND, +BS  Extremities: trace BLE edema  Skin: intact (few old healing abrasions)  Dialysis Access: LUA AVG, +T/B    Basic Metabolic Panel:  Lab 10/26/11 8119 10/25/11 0535 10/24/11 0322 10/24/11 0220 10/22/11 2330  NA 131* 133* 127* -- 125*  K 4.5 4.0 4.5 -- 4.4  CL 94* 93* 85* -- 84*  CO2 26 25 21  -- 21  GLUCOSE 90 89 94 -- 78  BUN 29* 24* 47* -- 44*  CREATININE 7.65* 5.41* 9.49* -- 9.27*  CALCIUM 8.5 8.8 8.4 -- --  MG -- -- -- 2.2 --  PHOS -- -- -- 5.7* --    Liver Function Tests:  Lab 10/22/11 2330  AST 42*  ALT 39  ALKPHOS 63  BILITOT 0.3  PROT 7.5  ALBUMIN 3.4*    Lab 10/22/11 2330  LIPASE 80*  AMYLASE --   No results found for this basename: AMMONIA:3 in the last 168 hours  CBC:  Lab 10/26/11 0920 10/25/11 0535 10/22/11 2330  WBC 3.7*  3.3* 4.0  NEUTROABS -- -- 1.4*  HGB 9.6* 10.3* 10.0*  HCT 29.4* 31.4* 29.6*  MCV 97.0 97.2 92.8  PLT 99* 105* 120*   Dialysis Orders: Center: [facility] on MWF. Dr. Arrie Aran  EDW 72Kg HD Bath 2K/2.25Ca+ Time 4hrs Heparin 2,000. Access LUA AVG BFR 400 DFR 800  Zemplar 4 mcg IV/HD Epogen 3,400 Units IV/HD Venofer none  Other #15g needles   Assessment/ Plan:  1. Pancreatitis-stable 2. Tremors/ETOH withdrawal- on Ativan protocol  3. ESRD - MWF, [facility]. Had HD yesterday due to holdover from Friday 4. Hypertension/volume -stable 5. Anemia - Hgb 10.3, on ESA with aranesp, no IV iron 6. Metabolic bone disease - phos 5.7, ca 8.4, on phoslo, Fosrenol, and Zemplar outpt 7. Nutrition - albumin 3.4 8. Medical Noncompliance- stressed compliance  9. COPD- stable 10. Disposition- per primary services   Vinson Moselle  MD [facility] [phone] pgr    [phone] cell 10/26/2011, 1:38 PM

## 2011-10-26 NOTE — Progress Notes (Addendum)
S: Patient c/ right shoulder pain x 2-3 days. He reports that he was cleaning some blackberry bush in the yard prior to his pain started.  And he also noticed some skin scraches from blackberry bush. The history is limited by the condition of patient (alcohol withdrawal)  O: general:NAD      Ext: right shoulder deltoid muscle area erythema, swelling and tenderness to touch noted. Area is marked. Limited ROM exam due to tenderness. There are several healing skin abrasion noted right below the erythema area. No Drainage noted.  A/P - Right shoulder X ray rule out any fracture or bony deformity ( patient abuses alcohol>>>? Fall) - If Shoulder X ray negative for fracture>> will consider to start ABx for treatment of cellulitis>>> patient is ESRD, will consider Doxycycline.  - will defer to primary team for further management.

## 2011-10-27 LAB — GLUCOSE, CAPILLARY: Glucose-Capillary: 94 mg/dL (ref 70–99)

## 2011-10-27 MED ORDER — COLCHICINE 0.6 MG PO TABS
0.3000 mg | ORAL_TABLET | ORAL | Status: DC
  Administered 2011-10-28: 12:00:00 via ORAL
  Filled 2011-10-27 (×2): qty 0.5

## 2011-10-27 MED ORDER — HEPARIN SODIUM (PORCINE) 1000 UNIT/ML DIALYSIS
20.0000 [IU]/kg | INTRAMUSCULAR | Status: DC | PRN
  Filled 2011-10-27: qty 2

## 2011-10-27 MED ORDER — PANTOPRAZOLE SODIUM 40 MG IV SOLR
40.0000 mg | Freq: Every day | INTRAVENOUS | Status: DC
  Administered 2011-10-27 – 2011-10-29 (×3): 40 mg via INTRAVENOUS
  Filled 2011-10-27 (×4): qty 40

## 2011-10-27 MED ORDER — DOXYCYCLINE HYCLATE 100 MG IV SOLR
100.0000 mg | Freq: Two times a day (BID) | INTRAVENOUS | Status: DC
  Administered 2011-10-27 – 2011-10-30 (×6): 100 mg via INTRAVENOUS
  Filled 2011-10-27 (×8): qty 100

## 2011-10-27 MED ORDER — ALBUTEROL SULFATE (5 MG/ML) 0.5% IN NEBU: 2.5000 mg | INHALATION_SOLUTION | RESPIRATORY_TRACT | Status: DC | PRN

## 2011-10-27 NOTE — Progress Notes (Signed)
Subjective: Patient reports abdominal pain and right shoulder pain.  He is intermittently confused, thinking that surrounding staff (myself, RN and tech) are college kids playing a trick on him.  No acute distress. 7mg  dilaudid in last 24h. 14mg  ativan in last 24h.   Objective: Vital signs in last 24 hours: Filed Vitals:   10/27/11 0451 10/27/11 0511 10/27/11 0548 10/27/11 0651  BP: 159/84     Pulse: 80     Temp: 102.3 F (39.1 C)  101.7 F (38.7 C) 99.7 F (37.6 C)  TempSrc: Oral  Oral   Resp: 16 17 15 13   Height:      Weight:      SpO2: 91%      Weight change:   Intake/Output Summary (Last 24 hours) at 10/27/11 0816 Last data filed at 10/26/11 1341  Gross per 24 hour  Intake      0 ml  Output   4000 ml  Net  -4000 ml   Physical Exam: Vitals Reviewed.  Patient febrile to 101/7 at 5:48 am General: resting in bed, no acute distress, cooperative to exam HEENT: PERRL, EOMI, no scleral icterus Cardiac: RRR, no rubs, murmurs or gallops Pulm: clear to auscultation bilaterally, moving normal volumes of air Abd: voluntary guarding, diffusely TTP, BS normoactive Ext: warm and well perfused, no pedal edema, RUE erythematous around shoulder extending down to mid bicep area, within outlined markings, several healing scratches on upper arm as source of skin break Neuro: alert and oriented X3, but intermittently confused (thinks that staff are college kids playing a prank), cranial nerves II-XII grossly intact, minimal tremors, able to sit up without difficulty  Lab Results: Basic Metabolic Panel:  Lab 10/26/11 4098 10/25/11 0535 10/24/11 0220  NA 131* 133* --  K 4.5 4.0 --  CL 94* 93* --  CO2 26 25 --  GLUCOSE 90 89 --  BUN 29* 24* --  CREATININE 7.65* 5.41* --  CALCIUM 8.5 8.8 --  MG -- -- 2.2  PHOS -- -- 5.7*    Lab 10/22/11 2330  LIPASE 80*  AMYLASE --   CBC:  Lab 10/26/11 0920 10/25/11 0535 10/22/11 2330  WBC 3.7* 3.3* --  NEUTROABS -- -- 1.4*  HGB 9.6* 10.3* --    HCT 29.4* 31.4* --  MCV 97.0 97.2 --  PLT 99* 105* --   CBG:  Lab 10/24/11 2027  GLUCAP 75   Alcohol Level:  Lab 10/24/11 1128 10/24/11 0220  ETH <11 172*   Urinalysis:  Lab 10/24/11 0206  COLORURINE YELLOW  LABSPEC 1.008  PHURINE 8.0  GLUCOSEU 100*  HGBUR TRACE*  BILIRUBINUR NEGATIVE  KETONESUR NEGATIVE  PROTEINUR >300*  UROBILINOGEN 0.2  NITRITE NEGATIVE  LEUKOCYTESUR NEGATIVE   Studies/Results: Dg Shoulder Right Port  10/26/2011  *RADIOLOGY REPORT*  Clinical Data: Right shoulder pain.  No known injury.  PORTABLE RIGHT SHOULDER - 2+ VIEW  Comparison: None.  Findings: The humerus is located and the acromioclavicular joint is intact.  No notable degenerative disease about the shoulder. Imaged right lung and ribs are unremarkable.  IMPRESSION: Negative study.  Original Report Authenticated By: Bernadene Bell. Maricela Curet, M.D.   Medications: I have reviewed the patient's current medications. Scheduled Meds:   . acetaminophen      . albuterol  2.5 mg Nebulization Q6H  . antiseptic oral rinse  15 mL Mouth Rinse BID  . calcium acetate  667 mg Oral TID WC  . cloNIDine  0.3 mg Oral BID  . colchicine  0.3  mg Oral 2 times weekly  . darbepoetin (ARANESP) injection - DIALYSIS  25 mcg Intravenous Once  . darbepoetin (ARANESP) injection - DIALYSIS  25 mcg Intravenous Q Wed-HD  . doxycycline  100 mg Oral Q12H  . gabapentin  300 mg Oral TID  . heparin  5,000 Units Subcutaneous Q8H  . HYDROmorphone      . lanthanum  1,000 mg Oral TID WC  . lipase/protease/amylase  1 capsule Oral TID AC  . lisinopril  10 mg Oral QHS  . multivitamin  1 tablet Oral Daily  . pantoprazole  40 mg Oral Q1200  . paricalcitol      . paricalcitol  4 mcg Intravenous Q M,W,F-HD  . tiotropium  18 mcg Inhalation Daily  . DISCONTD: colchicine  0.6 mg Oral Daily  . DISCONTD: diazepam  10 mg Oral Q6H   Continuous Infusions:  PRN Meds:.acetaminophen, acetaminophen, calcium carbonate (dosed in mg elemental  calcium), camphor-menthol, cyclobenzaprine, docusate sodium, feeding supplement (NEPRO CARB STEADY), heparin, heparin, HYDROmorphone, hydrOXYzine, lidocaine, lidocaine-prilocaine, LORazepam, ondansetron (ZOFRAN) IV, ondansetron, pentafluoroprop-tetrafluoroeth, sorbitol, zolpidem, DISCONTD: diazepam  Assessment/Plan:  # Alcohol withdrawal: Patient was transferred to SDU yesterday for more aggressive EtOH withdrawal mgmt.  This morning he remains tachycardic, but normal heart rate, and less tremulous.  He is intermittently confused, but able to converse appropriately for the most part.  - Continue ativan and Valium on current PRN schedule based on CIWA protocol.  - continue to monitor in SDU   #Cellulitis of RUE: Doxycycline Day 2.  Scratch on patient's arm appears to be source of skin break for infection to be introduced.  At this point, infection appears to be more superficial. XR did not suggest fracture. Patient was febrile this morning.   -Continue to monitor with doxycycline treatment, and tylenol for symptom mgmt -Regarding other sources of infection, patient has not produced urine for urine sample, blood cultures are in process  #Acute pancreatitis (on chronic): Pain managed with dilaudid, likely due to EtOH.  -maintain NPO status until clinical status improves, will check on him again later today -continue pain mgmt with dilaudid 1mg  q2h  #Hyponatremia: likely 2/2 ESRD, improved with HD   #ESRD: MWF schedule, next session tomorrow  #COPD: no active issues, continue albuterol & spiriva   #Chronic Normocytic Anemia: at baseline; 2/2 ESRD, treated with aranesp   #HTN: elevated likely 2/2 withdrawal.  Continue home meds for now.  Will need be sure to continue clonidine during NPO status (unless he is unable to tolerate) to avoid rebound  #Question of Gout: Today patient tells me he does not have gout, but given intermittent confusion, will need to clarify with chart review/with his PCP.   For now, change to renal dosing of colchicine as suggested by pharmacy to avoid GI symptoms  #VTE ppx: heparin TID   #Dispo:  pending clinical improvement   LOS: 4 days   KAPADIA, Sherylann Vangorden 10/27/2011, 8:16 AM

## 2011-10-27 NOTE — Progress Notes (Signed)
[facility] ROUNDING NOTE   Subjective:   Interval History:  A bit more alert, sitting up on side of bed, still confused, less lethargic. NPO.  Objective:  Vital signs in last 24 hours:  Temp:  [98.7 F (37.1 C)-102.9 F (39.4 C)] 98.7 F (37.1 C) (03/24 1133) Pulse Rate:  [73-95] 80  (03/24 0451) Resp:  [10-23] 16  (03/24 1133) BP: (124-184)/(71-99) 124/71 mmHg (03/24 1041) SpO2:  [86 %-99 %] 97 % (03/24 1210)  Weight change:  Filed Weights   10/24/11 1934 10/26/11 0830 10/26/11 1341  Weight: 71.7 kg (158 lb 1.1 oz) 72.8 kg (160 lb 7.9 oz) 69.3 kg (152 lb 12.5 oz)    Intake/Output: I/O last 3 completed shifts: In: -  Out: 4000 [Other:4000]   Intake/Output this shift:     General: awake, responds verbally, lethargic , sitting at side of bed HEENT: atraumatic, normocephalic  Eyes: sclera nonicteric  Neck: supple  Heart: RRR  Lungs: diminished bases, prolonged expiratory phase, otherwise clear  Abdomen: soft, diffuse tenderness, ND, +BS  Extremities: trace BLE edema  Skin: intact (few old healing abrasions)  Dialysis Access: LUA AVG, +T/B    Basic Metabolic Panel:  Lab 10/26/11 1610 10/25/11 0535 10/24/11 0322 10/24/11 0220 10/22/11 2330  NA 131* 133* 127* -- 125*  K 4.5 4.0 4.5 -- 4.4  CL 94* 93* 85* -- 84*  CO2 26 25 21  -- 21  GLUCOSE 90 89 94 -- 78  BUN 29* 24* 47* -- 44*  CREATININE 7.65* 5.41* 9.49* -- 9.27*  CALCIUM 8.5 8.8 8.4 -- --  MG -- -- -- 2.2 --  PHOS -- -- -- 5.7* --    Liver Function Tests:  Lab 10/22/11 2330  AST 42*  ALT 39  ALKPHOS 63  BILITOT 0.3  PROT 7.5  ALBUMIN 3.4*    Lab 10/22/11 2330  LIPASE 80*  AMYLASE --   No results found for this basename: AMMONIA:3 in the last 168 hours  CBC:  Lab 10/26/11 0920 10/25/11 0535 10/22/11 2330  WBC 3.7* 3.3* 4.0  NEUTROABS -- -- 1.4*  HGB 9.6* 10.3* 10.0*  HCT 29.4* 31.4* 29.6*  MCV 97.0 97.2 92.8  PLT 99* 105* 120*   Dialysis Orders: Center: [facility] on MWF.  Dr. Arrie Aran  EDW 72Kg HD Bath 2K/2.25Ca+ Time 4hrs Heparin 2,000. Access LUA AVG BFR 400 DFR 800  Zemplar 4 mcg IV/HD Epogen 3,400 Units IV/HD Venofer none  Other #15g needles   Assessment/ Plan:  1. Pancreatitis-stable 2. Tremors/ETOH withdrawal- on Ativan protocol  3. ESRD - MWF, [facility]. HD tomorrow 4. Hypertension/volume -stable 5. Anemia - Hgb 10.3, on ESA with aranesp, no IV iron 6. Metabolic bone disease - phos 5.7, ca 8.4, on phoslo, Fosrenol, and Zemplar outpt 7. Nutrition - albumin 3.4 8. Medical Noncompliance- stressed compliance  9. COPD- stable 10. Disposition- per primary services   Vinson Moselle  MD [facility] [phone] pgr    [phone] cell 10/27/2011, 2:19 PM

## 2011-10-28 ENCOUNTER — Inpatient Hospital Stay (HOSPITAL_COMMUNITY): Payer: Medicare Other

## 2011-10-28 LAB — URINE MICROSCOPIC-ADD ON

## 2011-10-28 LAB — RENAL FUNCTION PANEL
Albumin: 3.1 g/dL — ABNORMAL LOW (ref 3.5–5.2)
Calcium: 9.1 mg/dL (ref 8.4–10.5)
GFR calc Af Amer: 7 mL/min — ABNORMAL LOW (ref >90)
Glucose, Bld: 88 mg/dL (ref 70–99)
Phosphorus: 4.4 mg/dL (ref 2.3–4.6)
Potassium: 4 mEq/L (ref 3.5–5.1)
Sodium: 134 mEq/L — ABNORMAL LOW (ref 135–145)

## 2011-10-28 LAB — URINALYSIS, ROUTINE W REFLEX MICROSCOPIC
Bilirubin Urine: NEGATIVE
Glucose, UA: 100 mg/dL — AB
Ketones, ur: NEGATIVE mg/dL
Nitrite: NEGATIVE
Protein, ur: >300 mg/dL — AB
pH: 8 (ref 5.0–8.0)

## 2011-10-28 LAB — CBC
Hemoglobin: 11.1 g/dL — ABNORMAL LOW (ref 13.0–17.0)
MCH: 31.9 pg (ref 26.0–34.0)
MCHC: 32.8 g/dL (ref 30.0–36.0)
MCV: 97.1 fL (ref 78.0–100.0)
RDW: 15.8 % — ABNORMAL HIGH (ref 11.5–15.5)

## 2011-10-28 MED ORDER — DARBEPOETIN ALFA-POLYSORBATE 25 MCG/0.42ML IJ SOLN
INTRAMUSCULAR | Status: AC
  Administered 2011-10-28: 25 ug via INTRAVENOUS
  Filled 2011-10-28: qty 0.42

## 2011-10-28 MED ORDER — HYDROMORPHONE HCL PF 1 MG/ML IJ SOLN
1.0000 mg | INTRAMUSCULAR | Status: DC | PRN
  Administered 2011-10-28 – 2011-10-29 (×3): 1 mg via INTRAVENOUS
  Filled 2011-10-28 (×2): qty 1

## 2011-10-28 MED ORDER — PENTAFLUOROPROP-TETRAFLUOROETH EX AERO
INHALATION_SPRAY | CUTANEOUS | Status: AC
  Administered 2011-10-28: 1 via TOPICAL
  Filled 2011-10-28: qty 103.5

## 2011-10-28 MED ORDER — HYDROMORPHONE HCL PF 1 MG/ML IJ SOLN
INTRAMUSCULAR | Status: AC
  Administered 2011-10-28: 1 mg via INTRAVENOUS
  Filled 2011-10-28: qty 1

## 2011-10-28 MED ORDER — PARICALCITOL 5 MCG/ML IV SOLN
INTRAVENOUS | Status: AC
  Administered 2011-10-28: 4 ug via INTRAVENOUS
  Filled 2011-10-28: qty 1

## 2011-10-28 NOTE — Progress Notes (Signed)
Internal Medicine Teaching Service Attending Note Date: 10/28/2011  Patient name: EVON LOPEZPEREZ  Medical record number: [id number]  Date of birth: May 29, 1956    This patient has been seen and discussed with the house staff. Please see their note for complete details. I concur with their findings with the following additions/corrections:  Pt lying in bed. Able to sit up and transfer to SOB independently. C/O severe pain in R upper arm and abd. 1. Pancreatitis - no radiographic evidence to support dx of chronic pancreatitis. Certain;y did have acute pancreatitis but exam shows no ABD pain with distraction. Po held 2/2 withdrawal. Resume clears ans watch progress  2. ETOH withdrawal - stable. Cont CIWA and ativan. Transfer to tele  3. Cellulitis - I examined arm with Dr Dorthula Rue who confirmed that the superior part is better and the inf just a bit worse (gravitiy likely the culprit). Cont ABX.  Aleyda Gindlesperger 10/28/2011, 3:07 PM

## 2011-10-28 NOTE — Progress Notes (Signed)
[facility] ROUNDING NOTE   Subjective:   Interval History:  A bit more alert, sitting up on side of bed, still confused, less lethargic. NPO.  Objective:  Vital signs in last 24 hours:  Temp:  [98.3 F (36.8 C)-99.7 F (37.6 C)] 98.3 F (36.8 C) (03/25 0752) Pulse Rate:  [70-78] 70  (03/25 0357) Resp:  [11-23] 23  (03/25 0752) BP: (133-191)/(76-95) 157/82 mmHg (03/25 0752) SpO2:  [91 %-97 %] 92 % (03/25 0752) Weight:  [68.1 kg (150 lb 2.1 oz)] 68.1 kg (150 lb 2.1 oz) (03/25 0700)  Weight change: -4.7 kg (-10 lb 5.8 oz) Filed Weights   10/26/11 0830 10/26/11 1341 10/28/11 0700  Weight: 72.8 kg (160 lb 7.9 oz) 69.3 kg (152 lb 12.5 oz) 68.1 kg (150 lb 2.1 oz)    Intake/Output: I/O last 3 completed shifts: In: -  Out: 80 [Urine:80]   Intake/Output this shift:     General: awake, responds verbally, lethargic , sitting at side of bed HEENT: atraumatic, normocephalic  Eyes: sclera nonicteric  Neck: supple  Heart: RRR  Lungs: diminished bases, prolonged expiratory phase, otherwise clear  Abdomen: soft, diffuse tenderness, ND, +BS  Extremities: trace BLE edema  Skin: intact (few old healing abrasions)  Dialysis Access: LUA AVG, +T/B    Basic Metabolic Panel:  Lab 10/26/11 1610 10/25/11 0535 10/24/11 0322 10/24/11 0220 10/22/11 2330  NA 131* 133* 127* -- 125*  K 4.5 4.0 4.5 -- 4.4  CL 94* 93* 85* -- 84*  CO2 26 25 21  -- 21  GLUCOSE 90 89 94 -- 78  BUN 29* 24* 47* -- 44*  CREATININE 7.65* 5.41* 9.49* -- 9.27*  CALCIUM 8.5 8.8 8.4 -- --  MG -- -- -- 2.2 --  PHOS -- -- -- 5.7* --    Liver Function Tests:  Lab 10/22/11 2330  AST 42*  ALT 39  ALKPHOS 63  BILITOT 0.3  PROT 7.5  ALBUMIN 3.4*    Lab 10/22/11 2330  LIPASE 80*  AMYLASE --   No results found for this basename: AMMONIA:3 in the last 168 hours  CBC:  Lab 10/26/11 0920 10/25/11 0535 10/22/11 2330  WBC 3.7* 3.3* 4.0  NEUTROABS -- -- 1.4*  HGB 9.6* 10.3* 10.0*  HCT 29.4* 31.4*  29.6*  MCV 97.0 97.2 92.8  PLT 99* 105* 120*   Dialysis Orders: Center: [facility] on MWF. Dr. Arrie Aran  EDW 72Kg HD Bath 2K/2.25Ca+ Time 4hrs Heparin 2,000. Access LUA AVG BFR 400 DFR 800  Zemplar 4 mcg IV/HD Epogen 3,400 Units IV/HD Venofer none  Other #15g needles   Assessment/ Plan:  1. Pancreatitis-stable 2. Tremors/ETOH withdrawal- on Ativan protocol, improved MS 3. ESRD - MWF, [facility]. HD today 4. Hypertension/volume -stable 5. Anemia - Hgb 10.3, on ESA with aranesp, no IV iron 6. Metabolic bone disease - phos 5.7, ca 8.4, on phoslo, Fosrenol, and Zemplar outpt 7. Nutrition - albumin 3.4 8. Medical Noncompliance- stressed compliance  9. COPD- stable   [name] Moselle  MD [facility] [phone] pgr    [phone] cell 10/28/2011, 10:41 AM

## 2011-10-28 NOTE — Progress Notes (Signed)
Subjective: No acute distress.  No significant events overnight.  C/o abdominal and right arm pain.  Objective: Vital signs in last 24 hours: Filed Vitals:   10/28/11 0700 10/28/11 0718 10/28/11 0752 10/28/11 1147  BP:   157/82   Pulse:      Temp:   98.3 F (36.8 C) 98.2 F (36.8 C)  TempSrc:   Oral Oral  Resp: 11  23   Height:      Weight: 150 lb 2.1 oz (68.1 kg)     SpO2:  92% 92%    Weight change: -10 lb 5.8 oz (-4.7 kg)  Intake/Output Summary (Last 24 hours) at 10/28/11 1153 Last data filed at 10/28/11 0700  Gross per 24 hour  Intake      0 ml  Output     80 ml  Net    -80 ml   Physical Exam: Vital signs reviewed. Afebrile. General: resting in bed, no acute distress, distractable HEENT: PERRL, EOMI, no scleral icterus Cardiac: RRR, no rubs, murmurs or gallops Pulm: b/l crackles Abd: soft, nontender (when distracted), nondistended, BS normoactive Ext: warm and well perfused, no pedal edema, RUE erythema around scratch marks reduced compared to yesterday, no edema Neuro: alert and oriented X3  Lab Results: Basic Metabolic Panel:  Lab 10/26/11 1610 10/25/11 0535 10/24/11 0220  NA 131* 133* --  K 4.5 4.0 --  CL 94* 93* --  CO2 26 25 --  GLUCOSE 90 89 --  BUN 29* 24* --  CREATININE 7.65* 5.41* --  CALCIUM 8.5 8.8 --  MG -- -- 2.2  PHOS -- -- 5.7*   Liver Function Tests:  Lab 10/22/11 2330  AST 42*  ALT 39  ALKPHOS 63  BILITOT 0.3  PROT 7.5  ALBUMIN 3.4*   CBC:  Lab 10/26/11 0920 10/25/11 0535 10/22/11 2330  WBC 3.7* 3.3* --  NEUTROABS -- -- 1.4*  HGB 9.6* 10.3* --  HCT 29.4* 31.4* --  MCV 97.0 97.2 --  PLT 99* 105* --   CBG:  Lab 10/27/11 0705 10/24/11 2027  GLUCAP 94 75     Alcohol Level:  Lab 10/24/11 1128 10/24/11 0220  ETH <11 172*    Micro Results: Recent Results (from the past 240 hour(s))  MRSA PCR SCREENING     Status: Normal   Collection Time   10/24/11  3:48 AM      Component Value Range Status Comment   MRSA by PCR  NEGATIVE  NEGATIVE  Final   CULTURE, BLOOD (ROUTINE X 2)     Status: Normal (Preliminary result)   Collection Time   10/25/11  9:15 PM      Component Value Range Status Comment   Specimen Description BLOOD RIGHT HAND LEFT SIDE RIGHT HAND   Final    Special Requests BOTTLES DRAWN AEROBIC AND ANAEROBIC 10CC   Final    Culture  Setup Time [id number]   Final    Culture     Final    Value:        BLOOD CULTURE RECEIVED NO GROWTH TO DATE CULTURE WILL BE HELD FOR 5 DAYS BEFORE ISSUING A FINAL NEGATIVE REPORT   Report Status PENDING   Incomplete   CULTURE, BLOOD (ROUTINE X 2)     Status: Normal (Preliminary result)   Collection Time   10/25/11  9:20 PM      Component Value Range Status Comment   Specimen Description BLOOD RIGHT HAND MIDDLE RIGHT HAND   Final  Special Requests BOTTLES DRAWN AEROBIC AND ANAEROBIC 10CC   Final    Culture  Setup Time [id number]   Final    Culture     Final    Value:        BLOOD CULTURE RECEIVED NO GROWTH TO DATE CULTURE WILL BE HELD FOR 5 DAYS BEFORE ISSUING A FINAL NEGATIVE REPORT   Report Status PENDING   Incomplete    Studies/Results: Dg Shoulder Right Port  10/26/2011  *RADIOLOGY REPORT*  Clinical Data: Right shoulder pain.  No known injury.  PORTABLE RIGHT SHOULDER - 2+ VIEW  Comparison: None.  Findings: The humerus is located and the acromioclavicular joint is intact.  No notable degenerative disease about the shoulder. Imaged right lung and ribs are unremarkable.  IMPRESSION: Negative study.  Original Report Authenticated By: Bernadene Bell. Maricela Curet, M.D.   Medications: I have reviewed the patient's current medications. Scheduled Meds:   . antiseptic oral rinse  15 mL Mouth Rinse BID  . calcium acetate  667 mg Oral TID WC  . cloNIDine  0.3 mg Oral BID  . colchicine  0.3 mg Oral 2 times weekly  . darbepoetin (ARANESP) injection - DIALYSIS  25 mcg Intravenous Once  . darbepoetin (ARANESP) injection - DIALYSIS  25 mcg Intravenous Q Wed-HD  . doxycycline  (VIBRAMYCIN) IV  100 mg Intravenous Q12H  . gabapentin  300 mg Oral TID  . heparin  5,000 Units Subcutaneous Q8H  . lanthanum  1,000 mg Oral TID WC  . lipase/protease/amylase  1 capsule Oral TID AC  . lisinopril  10 mg Oral QHS  . multivitamin  1 tablet Oral Daily  . pantoprazole (PROTONIX) IV  40 mg Intravenous QHS  . paricalcitol  4 mcg Intravenous Q M,W,F-HD  . tiotropium  18 mcg Inhalation Daily  . DISCONTD: albuterol  2.5 mg Nebulization Q6H   Continuous Infusions:  PRN Meds:.acetaminophen, acetaminophen, albuterol, calcium carbonate (dosed in mg elemental calcium), camphor-menthol, cyclobenzaprine, docusate sodium, feeding supplement (NEPRO CARB STEADY), heparin, HYDROmorphone, hydrOXYzine, lidocaine, lidocaine-prilocaine, LORazepam, ondansetron (ZOFRAN) IV, ondansetron, pentafluoroprop-tetrafluoroeth, sorbitol, DISCONTD: heparin, DISCONTD: heparin  Assessment/Plan:  # Alcohol withdrawal: 4mg  ativan in last 24h. No more withdrawal symptoms. Able to converse appropriately. - Continue CIWA - transfer out of SDU  #Cellulitis of RUE: Improved. Doxycycline Day 3. Scratch on patient's arm appears to be source of skin break for infection to be introduced. At this point, infection appears to be more superficial. XR did not suggest fracture. Patient was afebrile this morning.  -Continue to monitor with doxycycline treatment, and tylenol for symptom mgmt  -Regarding other sources of infection, patient has not produced urine for urine sample, blood cultures are in process   #Acute pancreatitis (on chronic): 6mg  dilaudid in last 24h. Pain managed with dilaudid, likely due to EtOH.  -trial of clear liquid diet today  -change pain mgmt to dilaudid 1mg  q4h   #Hyponatremia: 2/2 ESRD, should improve with HD  #ESRD: MWF schedule, HD today  #COPD: no active issues, continue albuterol & spiriva   #Chronic Normocytic Anemia: at baseline; 2/2 ESRD, treated with aranesp   #HTN: moderately  elevated likely 2/2 fluid overload.  HD today  Continue home meds  #VTE ppx: heparin TID   #Dispo: pending clinical improvement   LOS: 5 days   KAPADIA, Brandun Pinn 10/28/2011, 11:53 AM

## 2011-10-29 LAB — URINE CULTURE
Colony Count: NO GROWTH
Culture  Setup Time: 201303252124

## 2011-10-29 MED ORDER — HYDROCODONE-ACETAMINOPHEN 5-325 MG PO TABS
1.0000 | ORAL_TABLET | ORAL | Status: DC | PRN
  Administered 2011-10-29 – 2011-10-30 (×6): 1 via ORAL
  Filled 2011-10-29 (×5): qty 1

## 2011-10-29 MED ORDER — HEPARIN SODIUM (PORCINE) 1000 UNIT/ML DIALYSIS
2000.0000 [IU] | INTRAMUSCULAR | Status: DC | PRN
  Filled 2011-10-29: qty 2

## 2011-10-29 MED ORDER — HYDROCODONE-ACETAMINOPHEN 5-325 MG PO TABS: 1.0000 | ORAL_TABLET | ORAL | Status: DC | PRN

## 2011-10-29 NOTE — Progress Notes (Signed)
[facility] ROUNDING NOTE   Subjective:   Interval History:  Better, tremors resolved, R arm feels better  Objective:  Vital signs in last 24 hours:  Temp:  [97.4 F (36.3 C)-98.4 F (36.9 C)] 97.8 F (36.6 C) (03/26 0442) Pulse Rate:  [62-73] 69  (03/26 0442) Resp:  [10-19] 19  (03/26 0442) BP: (133-188)/(77-102) 153/89 mmHg (03/26 0442) SpO2:  [91 %-98 %] 96 % (03/26 0442) Weight:  [67 kg (147 lb 11.3 oz)-68.3 kg (150 lb 9.2 oz)] 67.9 kg (149 lb 11.1 oz) (03/26 0442)  Weight change: 0.2 kg (7.1 oz) Filed Weights   10/28/11 1235 10/28/11 1720 10/29/11 0442  Weight: 68.3 kg (150 lb 9.2 oz) 67 kg (147 lb 11.3 oz) 67.9 kg (149 lb 11.1 oz)    Intake/Output: I/O last 3 completed shifts: In: 1790 [P.O.:1040; IV Piggyback:750] Out: 450 [Urine:150; Other:300]   Intake/Output this shift:  Total I/O In: 840 [P.O.:840] Out: -   General: alert, up at side of bed HEENT: atraumatic, normocephalic  Eyes: sclera nonicteric  Neck: supple  Heart: RRR  Lungs: diminished bases, prolonged expiratory phase, otherwise clear  Abdomen: soft, diffuse tenderness, ND, +BS  Extremities: no edema, R arm erythema much better Skin: intact (few old healing abrasions)  Dialysis Access: LUA AVG, +T/B    Basic Metabolic Panel:  Lab 10/28/11 1610 10/26/11 0640 10/25/11 0535 10/24/11 0322 10/24/11 0220 10/22/11 2330  NA 134* 131* 133* 127* -- 125*  K 4.0 4.5 4.0 4.5 -- 4.4  CL 95* 94* 93* 85* -- 84*  CO2 23 26 25 21  -- 21  GLUCOSE 88 90 89 94 -- 78  BUN 30* 29* 24* 47* -- 44*  CREATININE 8.95* 7.65* 5.41* 9.49* -- 9.27*  CALCIUM 9.1 8.5 8.8 -- -- --  MG -- -- -- -- 2.2 --  PHOS 4.4 -- -- -- 5.7* --    Liver Function Tests:  Lab 10/28/11 1314 10/22/11 2330  AST -- 42*  ALT -- 39  ALKPHOS -- 63  BILITOT -- 0.3  PROT -- 7.5  ALBUMIN 3.1* 3.4*    Lab 10/22/11 2330  LIPASE 80*  AMYLASE --   No results found for this basename: AMMONIA:3 in the last 168  hours  CBC:  Lab 10/28/11 1314 10/26/11 0920 10/25/11 0535 10/22/11 2330  WBC 5.1 3.7* 3.3* 4.0  NEUTROABS -- -- -- 1.4*  HGB 11.1* 9.6* 10.3* 10.0*  HCT 33.8* 29.4* 31.4* 29.6*  MCV 97.1 97.0 97.2 92.8  PLT 124* 99* 105* 120*   Dialysis Orders: Center: [facility] on MWF. Dr. Arrie Aran  EDW 72Kg HD Bath 2K/2.25Ca+ Time 4hrs Heparin 2,000. Access LUA AVG BFR 400 DFR 800  Zemplar 4 mcg IV/HD Epogen 3,400 Units IV/HD Venofer none  Other #15g needles   Assessment/ Plan:  1. Pancreatitis- per primary, advancing diet 2. Tremors/ETOH withdrawal- better 3. ESRD - MWF, [facility]. HD tomorrow, no vol excess. 4. Hypertension/volume -stable 5. Anemia - Hgb 10.3, on ESA with aranesp, no IV iron 6. Metabolic bone disease - phos 5.7, ca 8.4, on phoslo, Fosrenol, and Zemplar outpt 7. Nutrition - albumin 3.4 8. Medical Noncompliance- stressed compliance  9. COPD- stable   [name] Moselle  MD [facility] [phone] pgr    [phone] cell 10/29/2011, 10:53 AM

## 2011-10-29 NOTE — Progress Notes (Signed)
Subjective: Still c/o abdominal pain, but improved.  Tolerated liquids yesterday.  Sitting up in bed and talking with sitter when I came in.  Still c/o of right upper arm pain, feels like "fire ants"  Objective: Vital signs in last 24 hours: Filed Vitals:   10/28/11 1720 10/28/11 2000 10/28/11 2103 10/29/11 0442  BP: 183/101 151/81 141/87 153/89  Pulse: 67  70 69  Temp: 97.4 F (36.3 C)  97.7 F (36.5 C) 97.8 F (36.6 C)  TempSrc: Oral  Oral Oral  Resp: 11 11 18 19   Height:      Weight: 147 lb 11.3 oz (67 kg)   149 lb 11.1 oz (67.9 kg)  SpO2: 98%  98% 96%   Weight change: 7.1 oz (0.2 kg)  Intake/Output Summary (Last 24 hours) at 10/29/11 0815 Last data filed at 10/29/11 0045  Gross per 24 hour  Intake   1670 ml  Output    450 ml  Net   1220 ml   Physical Exam: Vital signs reviewed.  BP elevated to 183/101 yesterday, but improved this morning.  General: resting in bed, no acute distress Cardiac: RRR, no rubs, murmurs or gallops Pulm: clear to auscultation bilaterally, moving normal volumes of air Abd: soft, nontender, nondistended, BS normoactive Ext: warm and well perfused, no pedal edema, RUE erythema much improved, some redness tracking further down than yesterday Neuro: alert and oriented X3, cranial nerves II-XII grossly intact  Lab Results: Basic Metabolic Panel:  Lab 10/28/11 1610 10/26/11 0640 10/24/11 0220  NA 134* 131* --  K 4.0 4.5 --  CL 95* 94* --  CO2 23 26 --  GLUCOSE 88 90 --  BUN 30* 29* --  CREATININE 8.95* 7.65* --  CALCIUM 9.1 8.5 --  MG -- -- 2.2  PHOS 4.4 -- 5.7*   Liver Function Tests:  Lab 10/28/11 1314 10/22/11 2330  AST -- 42*  ALT -- 39  ALKPHOS -- 63  BILITOT -- 0.3  PROT -- 7.5  ALBUMIN 3.1* 3.4*    Lab 10/22/11 2330  LIPASE 80*  AMYLASE --   CBC:  Lab 10/28/11 1314 10/26/11 0920 10/22/11 2330  WBC 5.1 3.7* --  NEUTROABS -- -- 1.4*  HGB 11.1* 9.6* --  HCT 33.8* 29.4* --  MCV 97.1 97.0 --  PLT 124* 99* --    CBG:  Lab 10/27/11 0705 10/24/11 2027  GLUCAP 94 75   Alcohol Level:  Lab 10/24/11 1128 10/24/11 0220  ETH <11 172*   Urinalysis:  Lab 10/28/11 2000 10/24/11 0206  COLORURINE YELLOW YELLOW  LABSPEC 1.010 1.008  PHURINE 8.0 8.0  GLUCOSEU 100* 100*  HGBUR TRACE* TRACE*  BILIRUBINUR NEGATIVE NEGATIVE  KETONESUR NEGATIVE NEGATIVE  PROTEINUR >300* >300*  UROBILINOGEN 0.2 0.2  NITRITE NEGATIVE NEGATIVE  LEUKOCYTESUR NEGATIVE NEGATIVE    Micro Results: Recent Results (from the past 240 hour(s))  MRSA PCR SCREENING     Status: Normal   Collection Time   10/24/11  3:48 AM      Component Value Range Status Comment   MRSA by PCR NEGATIVE  NEGATIVE  Final   CULTURE, BLOOD (ROUTINE X 2)     Status: Normal (Preliminary result)   Collection Time   10/25/11  9:15 PM      Component Value Range Status Comment   Specimen Description BLOOD RIGHT HAND LEFT SIDE RIGHT HAND   Final    Special Requests BOTTLES DRAWN AEROBIC AND ANAEROBIC 10CC   Final    Culture  Setup Time [id number]   Final    Culture     Final    Value:        BLOOD CULTURE RECEIVED NO GROWTH TO DATE CULTURE WILL BE HELD FOR 5 DAYS BEFORE ISSUING A FINAL NEGATIVE REPORT   Report Status PENDING   Incomplete   CULTURE, BLOOD (ROUTINE X 2)     Status: Normal (Preliminary result)   Collection Time   10/25/11  9:20 PM      Component Value Range Status Comment   Specimen Description BLOOD RIGHT HAND MIDDLE RIGHT HAND   Final    Special Requests BOTTLES DRAWN AEROBIC AND ANAEROBIC 10CC   Final    Culture  Setup Time [id number]   Final    Culture     Final    Value:        BLOOD CULTURE RECEIVED NO GROWTH TO DATE CULTURE WILL BE HELD FOR 5 DAYS BEFORE ISSUING A FINAL NEGATIVE REPORT   Report Status PENDING   Incomplete    Medications: I have reviewed the patient's current medications. Scheduled Meds:    . antiseptic oral rinse  15 mL Mouth Rinse BID  . calcium acetate  667 mg Oral TID WC  . cloNIDine  0.3 mg Oral  BID  . colchicine  0.3 mg Oral 2 times weekly  . darbepoetin (ARANESP) injection - DIALYSIS  25 mcg Intravenous Once  . darbepoetin (ARANESP) injection - DIALYSIS  25 mcg Intravenous Q Wed-HD  . doxycycline (VIBRAMYCIN) IV  100 mg Intravenous Q12H  . gabapentin  300 mg Oral TID  . heparin  5,000 Units Subcutaneous Q8H  . lanthanum  1,000 mg Oral TID WC  . lipase/protease/amylase  1 capsule Oral TID AC  . lisinopril  10 mg Oral QHS  . multivitamin  1 tablet Oral Daily  . pantoprazole (PROTONIX) IV  40 mg Intravenous QHS  . paricalcitol  4 mcg Intravenous Q M,W,F-HD  . tiotropium  18 mcg Inhalation Daily   Continuous Infusions:  PRN Meds:.acetaminophen, acetaminophen, albuterol, calcium carbonate (dosed in mg elemental calcium), camphor-menthol, cyclobenzaprine, docusate sodium, feeding supplement (NEPRO CARB STEADY), heparin, HYDROmorphone, hydrOXYzine, lidocaine, lidocaine-prilocaine, LORazepam, ondansetron (ZOFRAN) IV, ondansetron, pentafluoroprop-tetrafluoroeth, sorbitol, DISCONTD: HYDROmorphone  Assessment/Plan:  #Cellulitis of RUE: Improved. Doxycycline Day 4. Scratch on patient's arm appears to be source of skin break for infection to be introduced. At this point, infection appears to be more superficial. XR did not suggest fracture. Patient was afebrile this morning.  -Continue to monitor with doxycycline treatment, and tylenol for symptom mgmt   #Acute pancreatitis (on chronic): 5mg  dilaudid in last 24h. Pancreatitis 2/2 to EtOH.  -trial of full liquids today  -change pain mgmt to vicodin 1-2 tab q4h prn  # Alcohol withdrawal: Last dose of ativan was at 11:48 yesterday (2mg ).  No more withdrawal symptoms. Able to converse appropriately.  - d/c ativan   #Hyponatremia: 2/2 ESRD, improved with HD  #ESRD: MWF schedule, HD yesterday  #COPD: no active issues, continue albuterol & spiriva   #Chronic Normocytic Anemia: at baseline; 2/2 ESRD, treated with aranesp   #HTN:  moderately elevated yesterday, improved this morning.  HD yesterday.  Continue home meds   #VTE ppx: heparin TID   #Dispo: pending clinical improvement   LOS: 6 days   KAPADIA, Mcihael Hinderman 10/29/2011, 8:15 AM

## 2011-10-29 NOTE — Progress Notes (Signed)
Internal Medicine Teaching Service Attending Note Date: 10/29/2011  Patient name: [name]  Medical record number: [id number]  Date of birth: Feb 09, 1956    This patient has been seen and discussed with the house staff. Please see their note for complete details. I concur with their findings with the following additions/corrections:  1. Pancreatitis - did great on clears will advance and change opioids to PO. Pt informed of this med change 2. RUE cellulitis - erythema, pain, and edema much improved. Cont ABX 3. ETOH withdrawal - resolved. Pt stable. D/C sitter  Plan is to D/C tomorrow after HD.  Corryn Madewell 10/29/2011, 11:08 AM

## 2011-10-30 ENCOUNTER — Inpatient Hospital Stay (HOSPITAL_COMMUNITY): Payer: Medicare Other

## 2011-10-30 MED ORDER — PARICALCITOL 5 MCG/ML IV SOLN
INTRAVENOUS | Status: AC
  Administered 2011-10-30: 4 ug via INTRAVENOUS
  Filled 2011-10-30: qty 1

## 2011-10-30 MED ORDER — HYDROCODONE-ACETAMINOPHEN 5-325 MG PO TABS: 1.0000 | ORAL_TABLET | ORAL | Status: AC | PRN

## 2011-10-30 MED ORDER — DOXYCYCLINE HYCLATE 100 MG PO CAPS: 100.0000 mg | ORAL_CAPSULE | Freq: Two times a day (BID) | ORAL | Status: AC

## 2011-10-30 MED ORDER — GABAPENTIN 300 MG PO CAPS: 300.0000 mg | ORAL_CAPSULE | Freq: Two times a day (BID) | ORAL | Status: DC

## 2011-10-30 MED ORDER — RENA-VITE PO TABS: 1.0000 | ORAL_TABLET | Freq: Every day | ORAL | Status: DC

## 2011-10-30 MED ORDER — DARBEPOETIN ALFA-POLYSORBATE 25 MCG/0.42ML IJ SOLN
INTRAMUSCULAR | Status: AC
  Administered 2011-10-30: 25 ug via INTRAVENOUS
  Filled 2011-10-30: qty 0.42

## 2011-10-30 MED ORDER — HYDROCODONE-ACETAMINOPHEN 5-325 MG PO TABS
ORAL_TABLET | ORAL | Status: AC
  Administered 2011-10-30: 1 via ORAL
  Filled 2011-10-30: qty 1

## 2011-10-30 NOTE — Discharge Summary (Signed)
Internal Medicine Teaching [employer] Discharge Note  Name: [name] MRN: [id number] DOB: [birth year] 56 y.o.  Date of Admission: 10/23/2011 11:20 PM Date of Discharge: 10/30/2011 Attending Physician: Burns Spain, MD  Discharge Diagnosis: #Acute on (questionable) Chronic Pancreatitis #EtOH withdrawal #HTN #ESRD, HD MWF #Right shoulder Cellulitis  #h/o COPD #Chronic Normocytic Anemia   Discharge Medications: Medication List  As of 10/31/2011 12:12 PM   STOP taking these medications         colchicine 0.6 MG tablet         TAKE these medications         albuterol 108 (90 BASE) MCG/ACT inhaler   Commonly known as: PROVENTIL HFA;VENTOLIN HFA   Inhale 2 puffs into the lungs every 4 (four) hours as needed. For shortness of breath.      calcium acetate 667 MG capsule   Commonly known as: PHOSLO   Take 1 capsule (667 mg total) by mouth 3 (three) times daily with meals.      calcium-vitamin D 500-200 MG-UNIT per tablet   Commonly known as: OSCAL WITH D   Take 1 tablet by mouth every 6 (six) hours as needed.      cloNIDine 0.3 MG tablet   Commonly known as: CATAPRES   Take 1 tablet (0.3 mg total) by mouth 2 (two) times daily.      doxycycline 100 MG capsule   Commonly known as: VIBRAMYCIN   Take 1 capsule (100 mg total) by mouth 2 (two) times daily.      folic acid 1 MG tablet   Commonly known as: FOLVITE   Take 1 tablet (1 mg total) by mouth daily.      fosinopril 10 MG tablet   Commonly known as: MONOPRIL   Take 1 tablet (10 mg total) by mouth at bedtime.      gabapentin 300 MG capsule   Commonly known as: NEURONTIN   Take 1 capsule (300 mg total) by mouth 2 (two) times daily. For burning feet      HYDROcodone-acetaminophen 5-325 MG per tablet   Commonly known as: NORCO   Take 1 tablet by mouth every 4 (four) hours as needed.      lanthanum 1000 MG chewable tablet   Commonly known as: FOSRENOL   Chew 1,000 mg by mouth 3 (three) times daily  with meals.      lipase/protease/amylase 40981 UNITS Cpep   Commonly known as: CREON-10/PANCREASE   Take 1 capsule by mouth 3 (three) times daily before meals.      ondansetron 4 MG tablet   Commonly known as: ZOFRAN   Take 1 tablet (4 mg total) by mouth every 8 (eight) hours as needed. For nausea.      pantoprazole 40 MG tablet   Commonly known as: PROTONIX   Take 1 tablet (40 mg total) by mouth daily.      Rena-Vite Rx 1 MG Tabs   Take 1 tablet (1 mg total) by mouth daily.      thiamine 100 MG tablet   Take 1 tablet (100 mg total) by mouth daily.      tiotropium 18 MCG inhalation capsule   Commonly known as: SPIRIVA   Place 1 capsule (18 mcg total) into inhaler and inhale daily.      zolpidem 5 MG tablet   Commonly known as: AMBIEN   Take 1 tablet (5 mg total) by mouth at bedtime as needed for sleep.  Disposition and follow-up:   [name] was discharged from [facility] in Good condition.  At hospital follow up, please reassess RUE cellulitis and resolution of pancreatitis.   Follow-up Appointments: Follow-up Information    Follow up with LI, NA, MD on 11/12/2011. (8:45am)    Contact information:   [address] [place] [address] [phone]         Discharge Orders    Future Appointments: Provider: Department: Dept Phone: Center:   10/31/2011 4:00 PM [facility] Rm 51 [facility] [phone] [facility]   11/12/2011 9:45 AM Dede Query, MD [facility] [phone] [facility]   11/15/2011 4:00 PM Rachael Fee, MD [facility] [phone] [facility]   12/05/2011 1:15 PM Melida Quitter, MD [facility] [phone] [facility]     Future Orders Please Complete By Expires   Diet - low sodium heart healthy      Scheduling Instructions:   Advance as tolerated   Increase activity slowly      Discharge instructions      Comments:   -Advance your diet slowly at home as tolerated, and please be sure to  restart taking your Creon.   -Please complete your course of doxycycline (antibiotics) for your skin infection of your right arm.   -Please be sure to keep taking your PhosLo daily as prescribed.   -Please be sure to decrease Gabapentin to 300mg  twice daily, NOT THREE TIMES PER DAY -Continue to take fosinopril & clonidine daily for your blood pressure -Please be sure to go to your hospital follow up appointment. Please bring all of your medications with you to follow up. -Continue on your dialysis regimen as scheduled -If you have any new or worsening symptoms, please call the clinic at [phone], or go to your nearest emergency department.   Call MD for:  temperature >100.4      Call MD for:  persistant nausea and vomiting      Call MD for:  severe uncontrolled pain      Call MD for:  difficulty breathing, headache or visual disturbances      Call MD for:  hives      Call MD for:  persistant dizziness or light-headedness      Call MD for:  extreme fatigue         Consultations: Renal  Procedures Performed:  Dg Chest 2 View 10/24/2011   IMPRESSION:  1.  No acute cardiopulmonary abnormalities. 2.  Hyperinflation and coarsened interstitial markings suggests COPD.     Dg Shoulder Right Port 10/26/2011   IMPRESSION: Negative study.      Admission HPI:  [name] is a 56 y.o. male with PMH of multiple medical problems including ESRD on HD, COPD, respiratory failure (Jan 2012), chronic recurrent pancreatitis and polysubstance abuse who presents with abdominal pain. The pain started this evening, awaking the patient from sleep. His pain is in the right upper abdomen underneath his ribs, and feels similar to prior pancreatitis type pains. The pain is currently 10 out of 10, with radiation to the right back. It has no exacerbating or alleviating factors. It is a constant and throbbing and stabbing pain. It is so bad that he feels as though he is losing his breath. It is associated with 2-3  episodes of nausea and vomiting today, no blood. The patient denies chest pain.  The patient did attend hemodialysis today, and is on a MWF schedule. He has  been on dialysis for 3 years, with ESRD thought secondary to hypertension.  Patient has been worked up for bright red blood per rectum as an outpatient, has a colonoscopy scheduled.  Admission Physical Exam:  Filed Vitals:    10/23/11 2329  10/23/11 2345  10/24/11 0100   BP:  164/91  164/97  176/111   Pulse:   98  93   Temp:  98.1 F (36.7 C)     TempSrc:  Oral     Resp:  33  20  13   SpO2:  95%  99%  93%    GEN: No apparent distress. Alert and oriented x 3. Pleasant, conversant, and cooperative to exam.  HEENT: head is autraumatic and normocephalic. EOMI. PERRLA. Sclerae anicteric. Conjunctivae without pallor or injection. Mucous membranes dry. Oropharynx is without erythema, exudates, or other abnormal lesions.  RESP: Lungs are clear to ascultation bilaterally with good air movement. No wheezes, ronchi, or rubs.  CARDIOVASCULAR: regular rate, normal rhythm. Clear S1, S2, 2/6 mid systolic murmer at LSB  ABDOMEN: full, very TTP in RUQ, NABS. No masses.  EXT: warm and dry. Peripheral pulses equal, intact, and +2 globally. No clubbing or cyanosis. No edema in b/l lower extremeties  SKIN: warm and dry with normal turgor. No rashes or abnormal lesions observed.   Admission Lab results:  Basic Metabolic Panel:   [facility]  10/22/11 2330   NA  125*   K  4.4   CL  84*   CO2  21   GLUCOSE  78   BUN  44*   CREATININE  9.27*   CALCIUM  8.8   MG  --   PHOS  --    Liver Function Tests:   [facility]  10/22/11 2330   AST  42*   ALT  39   ALKPHOS  63   BILITOT  0.3   PROT  7.5   ALBUMIN  3.4*     Basename  10/22/11 2330   LIPASE  80*   AMYLASE  --    CBC:   Basename  10/22/11 2330   WBC  4.0   NEUTROABS  1.4*   HGB  10.0*   HCT  29.6*   MCV  92.8   PLT  [facility] Course by problem list:  #Acute  pancreatitis (on chronic): Pancreatitis 2/2 to EtOH. Patient was NPO for several days (4-5d).  Diet was eventually advanced and well tolerated.  No IVF mgmt in setting of ESRD.  He was initially managed with dilaudid and then transitioned to vicodin, 1 tab q4h PRN.  He was discharged with Norco 5-325 q4h prn pain #10. The accuracy of his chronic pancreatitis dx is questionable as his prior ABD imaging studies do not show squelea of chronic pancreatitis. 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  #Cellulitis of RUE: Improved by day of discharge. Treated with Doxycycline for 5 days, and then discharged with additional 10 days.  Scratch on patient's arm appears to be source of skin break for infection to be introduced. Infection appears to be more superficial. XR did not suggest fracture. Patient was febrile once during hospitalization, and thought to be related to EtOH withdrawal, not infection.   # Alcohol withdrawal: Patient was monitored in SDU while undergoing withdrawal, which began 1-2d into hospitalization.  No ativan for greater than 48h prior to discharge. No more withdrawal symptoms. Able to converse appropriately.   #Hyponatremia: 2/2 ESRD, seemed to  always correct with HD   #ESRD: MWF schedule, HD 3 times during hospitalization.  Discharged with appropriate phosphate binders.   #COPD: no active issues, continue albuterol & spiriva   #Chronic Normocytic Anemia: at baseline; 2/2 ESRD, treated with aranesp (Hb ~10)  #HTN: intermittently elevated during hosptialization, likely combination of fluid overload and timing of clonidine, improved by discharge. Discharged with home dose of clonidine and ACEI   Discharge Vitals:  BP 176/85  Pulse 78  Temp(Src) 97.8 F (36.6 C) (Oral)  Resp 16  Ht 6' (1.829 m)  Wt 157 lb 3 oz (71.3 kg)  BMI 21.32 kg/m2  SpO2 98%  Discharge Labs:  Results for [name], [name] (MRN [id number]) as of 10/31/2011 12:29  Ref. Range 10/28/2011 13:14    Sodium Latest Range: 135-145 mEq/L 134 (L)  Potassium Latest Range: 3.5-5.1 mEq/L 4.0  Chloride Latest Range: 96-112 mEq/L 95 (L)  CO2 Latest Range: 19-32 mEq/L 23  BUN Latest Range: 6-23 mg/dL 30 (H)  Creat Latest Range: 0.50-1.35 mg/dL 4.09 (H)  Calcium Latest Range: 8.4-10.5 mg/dL 9.1  GFR calc non Af Amer Latest Range: >90 mL/min 6 (L)  GFR calc Af Amer Latest Range: >90 mL/min 7 (L)  Glucose Latest Range: 70-99 mg/dL 88  Phosphorus Latest Range: 2.3-4.6 mg/dL 4.4  Albumin Latest Range: 55.8-66.1 % 3.1 (L)  WBC Latest Range: 4.0-10.5 K/uL 5.1  RBC Latest Range: 4.22-5.81 MIL/uL 3.48 (L)  HGB Latest Range: 13.0-17.0 g/dL 81.1 (L)  HCT Latest Range: 39.0-52.0 % 33.8 (L)  MCV Latest Range: 78.0-100.0 fL 97.1  MCH Latest Range: 26.0-34.0 pg 31.9  MCHC Latest Range: 30.0-36.0 g/dL 91.4  RDW Latest Range: 11.5-15.5 % 15.8 (H)  Platelets Latest Range: 150-400 K/uL 124 (L)   Signed: KAPADIA, NEEMA 10/30/2011, 12:01 PM

## 2011-10-30 NOTE — Progress Notes (Signed)
Internal Medicine Teaching Service Attending Note Date: 10/30/2011  Patient name: [name]  Medical record number: [id number]  Date of birth: 1956/05/08    This patient has been seen and discussed with the house staff. Please see their note for complete details. I concur with their findings with the following additions/corrections:  [name] was dressed and walking about the room talking on the telephone to his wife. He was in no acute distress whatsoever. He did not mention any difficulties with increased pain or emesis with eating. He states his right shoulder still hurts but is getting better. [name] is stable for discharge to home once his wife is able to come and talk to him. He will followup in the clinic with his PCP.  Asalee Barrette 10/30/2011, 3:25 PM

## 2011-10-30 NOTE — Progress Notes (Signed)
[facility] ROUNDING NOTE   Subjective:   Interval History:  Better, tremors resolved, R arm feels better, wants to go home  Objective:  Vital signs in last 24 hours:  Temp:  [97.8 F (36.6 C)-98.8 F (37.1 C)] 97.8 F (36.6 C) (03/27 0700) Pulse Rate:  [52-78] 78  (03/27 1100) Resp:  [10-22] 16  (03/27 1100) BP: (129-180)/(72-98) 176/85 mmHg (03/27 1030) SpO2:  [94 %-98 %] 98 % (03/27 0700) Weight:  [70.5 kg (155 lb 6.8 oz)-71.3 kg (157 lb 3 oz)] 71.3 kg (157 lb 3 oz) (03/27 0700)  Weight change: 2.2 kg (4 lb 13.6 oz) Filed Weights   10/29/11 0442 10/30/11 0431 10/30/11 0700  Weight: 67.9 kg (149 lb 11.1 oz) 70.5 kg (155 lb 6.8 oz) 71.3 kg (157 lb 3 oz)    Intake/Output: I/O last 3 completed shifts: In: 3150 [P.O.:2400; IV Piggyback:750] Out: 150 [Urine:150]   Intake/Output this shift:     General: alert, on HD HEENT: atraumatic, normocephalic  Eyes: sclera nonicteric  Neck: supple  Heart: RRR  Lungs: diminished bases, prolonged expiratory phase, otherwise clear  Abdomen: soft, diffuse tenderness, ND, +BS  Extremities: no edema, R arm erythema much better Skin: intact (few old healing abrasions)  Dialysis Access: LUA AVG, +T/B    Basic Metabolic Panel:  Lab 10/28/11 0865 10/26/11 0640 10/25/11 0535 10/24/11 0322 10/24/11 0220  NA 134* 131* 133* 127* --  K 4.0 4.5 4.0 4.5 --  CL 95* 94* 93* 85* --  CO2 23 26 25 21  --  GLUCOSE 88 90 89 94 --  BUN 30* 29* 24* 47* --  CREATININE 8.95* 7.65* 5.41* 9.49* --  CALCIUM 9.1 8.5 8.8 -- --  MG -- -- -- -- 2.2  PHOS 4.4 -- -- -- 5.7*    Liver Function Tests:  Lab 10/28/11 1314  AST --  ALT --  ALKPHOS --  BILITOT --  PROT --  ALBUMIN 3.1*   No results found for this basename: LIPASE:5,AMYLASE:5 in the last 168 hours No results found for this basename: AMMONIA:3 in the last 168 hours  CBC:  Lab 10/28/11 1314 10/26/11 0920 10/25/11 0535  WBC 5.1 3.7* 3.3*  NEUTROABS -- -- --  HGB 11.1*  9.6* 10.3*  HCT 33.8* 29.4* 31.4*  MCV 97.1 97.0 97.2  PLT 124* 99* 105*   Dialysis Orders: Center: [facility] on MWF. Dr. Arrie Aran  EDW 72Kg HD Bath 2K/2.25Ca+ Time 4hrs Heparin 2,000. Access LUA AVG BFR 400 DFR 800  Zemplar 4 mcg IV/HD Epogen 3,400 Units IV/HD Venofer none  Other #15g needles   Assessment/ Plan:  1. Pancreatitis- per primary, advancing diet 2. Tremors/ETOH withdrawal- better 3. ESRD - MWF, [facility]. HD today, no vol excess. OK for d/c from renal standpoint after HD. 4. Hypertension/volume -stable on usual meds (ACEI, clonidine), continue at d/c 5. Anemia - Hgb 10.3, on ESA with aranesp, no IV iron 6. Metabolic bone disease - phos 5.7, ca 8.4, on phoslo, Fosrenol, and Zemplar outpt 7. Nutrition - albumin 3.4 8. Medical Noncompliance- stressed compliance  9. COPD- stable   Vinson Moselle  MD [facility] [phone] pgr    [phone] cell 10/30/2011, 11:35 AM

## 2011-10-30 NOTE — Progress Notes (Signed)
Subjective: Seen during HD. He tells me he did not tolerate diet and did not eat jello because too solid, but when I spoke to RN yesterday, he seemed to be tolerating diet reasonably well.  He reports emesis yesterday, but today's RN did not have such report, and no documentation in epic.  Still c/o RUE pain (fire) and abdominal pain. C/o that norco is not enough, but he is not receiving medication ever 4h anyhow.  He is written for norco 5-325 and has received 5 tab in last 24h.    Objective: Vital signs in last 24 hours: Filed Vitals:   10/30/11 0800 10/30/11 0830 10/30/11 0900 10/30/11 0930  BP: 143/76 141/76 180/89 134/80  Pulse: 57 60 77 67  Temp:      TempSrc:      Resp:  12 10 22   Height:      Weight:      SpO2:       Weight change: 4 lb 13.6 oz (2.2 kg)  Intake/Output Summary (Last 24 hours) at 10/30/11 1610 Last data filed at 10/30/11 0641  Gross per 24 hour  Intake   1140 ml  Output      0 ml  Net   1140 ml   Physical Exam: General: resting in bed, NAD, currently on HD Cardiac: RRR, no rubs, murmurs or gallops Pulm: clear to auscultation bilaterally, moving normal volumes of air Abd: soft, tender to deep auscultation, nondistended, BS normoactive Ext: warm and well perfused, no pedal edema Neuro: alert and oriented X3, cranial nerves II-XII grossly intact  Lab Results: Basic Metabolic Panel:  Lab 10/28/11 9604 10/26/11 0640 10/24/11 0220  NA 134* 131* --  K 4.0 4.5 --  CL 95* 94* --  CO2 23 26 --  GLUCOSE 88 90 --  BUN 30* 29* --  CREATININE 8.95* 7.65* --  CALCIUM 9.1 8.5 --  MG -- -- 2.2  PHOS 4.4 -- 5.7*   Liver Function Tests:  Lab 10/28/11 1314  AST --  ALT --  ALKPHOS --  BILITOT --  PROT --  ALBUMIN 3.1*   CBC:  Lab 10/28/11 1314 10/26/11 0920  WBC 5.1 3.7*  NEUTROABS -- --  HGB 11.1* 9.6*  HCT 33.8* 29.4*  MCV 97.1 97.0  PLT 124* 99*   CBG:  Lab 10/27/11 0705 10/24/11 2027  GLUCAP 94 75     Alcohol Level:  Lab 10/24/11 1128  10/24/11 0220  ETH <11 172*    Micro Results: Recent Results (from the past 240 hour(s))  MRSA PCR SCREENING     Status: Normal   Collection Time   10/24/11  3:48 AM      Component Value Range Status Comment   MRSA by PCR NEGATIVE  NEGATIVE  Final   CULTURE, BLOOD (ROUTINE X 2)     Status: Normal (Preliminary result)   Collection Time   10/25/11  9:15 PM      Component Value Range Status Comment   Specimen Description BLOOD RIGHT HAND LEFT SIDE RIGHT HAND   Final    Special Requests BOTTLES DRAWN AEROBIC AND ANAEROBIC 10CC   Final    Culture  Setup Time [id number]   Final    Culture     Final    Value:        BLOOD CULTURE RECEIVED NO GROWTH TO DATE CULTURE WILL BE HELD FOR 5 DAYS BEFORE ISSUING A FINAL NEGATIVE REPORT   Report Status PENDING   Incomplete   CULTURE,  BLOOD (ROUTINE X 2)     Status: Normal (Preliminary result)   Collection Time   10/25/11  9:20 PM      Component Value Range Status Comment   Specimen Description BLOOD RIGHT HAND MIDDLE RIGHT HAND   Final    Special Requests BOTTLES DRAWN AEROBIC AND ANAEROBIC 10CC   Final    Culture  Setup Time [id number]   Final    Culture     Final    Value:        BLOOD CULTURE RECEIVED NO GROWTH TO DATE CULTURE WILL BE HELD FOR 5 DAYS BEFORE ISSUING A FINAL NEGATIVE REPORT   Report Status PENDING   Incomplete   URINE CULTURE     Status: Normal   Collection Time   10/28/11  8:00 PM      Component Value Range Status Comment   Specimen Description URINE, CLEAN CATCH   Final    Special Requests NONE   Final    Culture  Setup Time [id number]   Final    Colony Count NO GROWTH   Final    Culture NO GROWTH   Final    Report Status 10/29/2011 FINAL   Final    Medications: I have reviewed the patient's current medications. Scheduled Meds:   . antiseptic oral rinse  15 mL Mouth Rinse BID  . calcium acetate  667 mg Oral TID WC  . cloNIDine  0.3 mg Oral BID  . colchicine  0.3 mg Oral 2 times weekly  . darbepoetin (ARANESP)  injection - DIALYSIS  25 mcg Intravenous Q Wed-HD  . doxycycline (VIBRAMYCIN) IV  100 mg Intravenous Q12H  . gabapentin  300 mg Oral TID  . heparin  5,000 Units Subcutaneous Q8H  . lanthanum  1,000 mg Oral TID WC  . lipase/protease/amylase  1 capsule Oral TID AC  . lisinopril  10 mg Oral QHS  . multivitamin  1 tablet Oral Daily  . pantoprazole (PROTONIX) IV  40 mg Intravenous QHS  . paricalcitol  4 mcg Intravenous Q M,W,F-HD  . tiotropium  18 mcg Inhalation Daily   Continuous Infusions:  PRN Meds:.acetaminophen, acetaminophen, albuterol, calcium carbonate (dosed in mg elemental calcium), camphor-menthol, cyclobenzaprine, docusate sodium, feeding supplement (NEPRO CARB STEADY), heparin, heparin, HYDROcodone-acetaminophen, hydrOXYzine, lidocaine, lidocaine-prilocaine, ondansetron (ZOFRAN) IV, ondansetron, pentafluoroprop-tetrafluoroeth, sorbitol  Assessment/Plan: #Cellulitis of RUE: Improved. Doxycycline Day 5 (rec duration 5-10d). Scratch on patient's arm appears to be source of skin break for infection to be introduced. Infection appears to be more superficial. XR did not suggest fracture. Patient was afebrile this morning.  -Continue to monitor with doxycycline treatment, and tylenol for symptom mgmt   #Acute pancreatitis (on chronic):  Pancreatitis 2/2 to EtOH.  -advance diet as tolerated -change pain mgmt to vicodin 1 tab q4h prn, not inclined to increased medication dosage as patient is not maxing out current order  # Alcohol withdrawal: Resolved.  No ativan for almost 48h.  No more withdrawal symptoms. Able to converse appropriately.   #Hyponatremia: 2/2 ESRD, will monitor post HD renal function panel, improves with HD   #ESRD: MWF schedule, HD today  #COPD: no active issues, continue albuterol & spiriva   #Chronic Normocytic Anemia: at baseline; 2/2 ESRD, treated with aranesp   #HTN: elevated this morning, likely combination of fluid overload and need for AM medication,  improved this morning. HD today to manage fluid status.  Continue home meds (clonidine BID)  #VTE ppx: heparin TID    LOS: 7 days  KAPADIA, Ash Mcelwain 10/30/2011, 9:38 AM

## 2011-10-31 ENCOUNTER — Encounter: Payer: Self-pay | Admitting: Gastroenterology

## 2011-10-31 ENCOUNTER — Ambulatory Visit (AMBULATORY_SURGERY_CENTER): Payer: Medicare Other | Admitting: *Deleted

## 2011-10-31 DIAGNOSIS — Z1211 Encounter for screening for malignant neoplasm of colon: Secondary | ICD-10-CM

## 2011-10-31 NOTE — Progress Notes (Signed)
Pt has rectal bleeding and abd pain.  OV made per Jerl Santos

## 2011-11-01 LAB — CULTURE, BLOOD (ROUTINE X 2)
Culture  Setup Time: 201303230132
Culture  Setup Time: 201303230132

## 2011-11-08 NOTE — Progress Notes (Signed)
Addended by: Maple Hudson on: 11/08/2011 03:37 PM   Modules accepted: Level of Service

## 2011-11-12 ENCOUNTER — Encounter: Payer: Medicare Other | Admitting: Internal Medicine

## 2011-11-15 ENCOUNTER — Other Ambulatory Visit: Payer: Medicare Other | Admitting: Gastroenterology

## 2011-11-27 ENCOUNTER — Ambulatory Visit: Payer: Medicare Other | Admitting: Gastroenterology

## 2011-11-29 ENCOUNTER — Other Ambulatory Visit: Payer: Self-pay | Admitting: *Deleted

## 2011-12-02 MED ORDER — GABAPENTIN 300 MG PO CAPS: 300.0000 mg | ORAL_CAPSULE | Freq: Two times a day (BID) | ORAL | Status: DC

## 2011-12-02 NOTE — Telephone Encounter (Signed)
Rx called in as written 

## 2011-12-03 ENCOUNTER — Ambulatory Visit (INDEPENDENT_AMBULATORY_CARE_PROVIDER_SITE_OTHER): Payer: Medicare Other | Admitting: Gastroenterology

## 2011-12-03 ENCOUNTER — Encounter: Payer: Self-pay | Admitting: Gastroenterology

## 2011-12-03 ENCOUNTER — Other Ambulatory Visit (INDEPENDENT_AMBULATORY_CARE_PROVIDER_SITE_OTHER): Payer: Medicare Other

## 2011-12-03 VITALS — BP 148/82 | HR 76 | Ht 72.0 in | Wt 162.0 lb

## 2011-12-03 DIAGNOSIS — K625 Hemorrhage of anus and rectum: Secondary | ICD-10-CM

## 2011-12-03 DIAGNOSIS — R109 Unspecified abdominal pain: Secondary | ICD-10-CM

## 2011-12-03 LAB — COMPREHENSIVE METABOLIC PANEL
Albumin: 3.4 g/dL — ABNORMAL LOW (ref 3.5–5.2)
CO2: 23 mEq/L (ref 19–32)
Calcium: 9.4 mg/dL (ref 8.4–10.5)
Chloride: 99 mEq/L (ref 96–112)
GFR: 9.06 mL/min — CL (ref >60.00)
Glucose, Bld: 103 mg/dL — ABNORMAL HIGH (ref 70–99)
Sodium: 137 mEq/L (ref 135–145)
Total Bilirubin: 0.9 mg/dL (ref 0.3–1.2)
Total Protein: 8.1 g/dL (ref 6.0–8.3)

## 2011-12-03 LAB — CBC WITH DIFFERENTIAL/PLATELET
Basophils Absolute: 0.1 10*3/uL (ref 0.0–0.1)
Eosinophils Relative: 6.3 % — ABNORMAL HIGH (ref 0.0–5.0)
HCT: 35.3 % — ABNORMAL LOW (ref 39.0–52.0)
Lymphocytes Relative: 34.4 % (ref 12.0–46.0)
Monocytes Relative: 10.9 % (ref 3.0–12.0)
Neutrophils Relative %: 46 % (ref 43.0–77.0)
Platelets: 202 10*3/uL (ref 150.0–400.0)
WBC: 2.6 10*3/uL — ABNORMAL LOW (ref 4.5–10.5)

## 2011-12-03 LAB — PROTIME-INR
INR: 1 ratio (ref 0.8–1.0)
Prothrombin Time: 11.1 s (ref 10.2–12.4)

## 2011-12-03 MED ORDER — MOVIPREP 100 G PO SOLR: 1.0000 | ORAL | Status: DC
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
## 2011-12-03 NOTE — Patient Instructions (Signed)
You will be set up for a colonoscopy with propofol (LEC) Limit NSAID medicines (ibuprogen and powders) these can cause ulcers, abdominal pains. Continue to completely avoid alcohol. You will have labs checked today in the basement lab.  Please head down after you check out with the front desk  (cbc, cmet, inr).

## 2011-12-05 ENCOUNTER — Encounter: Payer: Self-pay | Admitting: Internal Medicine

## 2011-12-05 ENCOUNTER — Ambulatory Visit (INDEPENDENT_AMBULATORY_CARE_PROVIDER_SITE_OTHER): Payer: Medicare Other | Admitting: Internal Medicine

## 2011-12-05 VITALS — BP 163/93 | HR 89 | Temp 98.1°F | Ht 72.0 in | Wt 163.5 lb

## 2011-12-05 DIAGNOSIS — J449 Chronic obstructive pulmonary disease, unspecified: Secondary | ICD-10-CM

## 2011-12-05 DIAGNOSIS — G589 Mononeuropathy, unspecified: Secondary | ICD-10-CM

## 2011-12-05 DIAGNOSIS — G629 Polyneuropathy, unspecified: Secondary | ICD-10-CM

## 2011-12-05 DIAGNOSIS — I10 Essential (primary) hypertension: Secondary | ICD-10-CM

## 2011-12-05 DIAGNOSIS — R1084 Generalized abdominal pain: Secondary | ICD-10-CM

## 2011-12-05 DIAGNOSIS — F419 Anxiety disorder, unspecified: Secondary | ICD-10-CM | POA: Insufficient documentation

## 2011-12-05 DIAGNOSIS — F1010 Alcohol abuse, uncomplicated: Secondary | ICD-10-CM

## 2011-12-05 DIAGNOSIS — F411 Generalized anxiety disorder: Secondary | ICD-10-CM

## 2011-12-05 MED ORDER — TRAMADOL HCL 50 MG PO TABS: 100.0000 mg | ORAL_TABLET | Freq: Four times a day (QID) | ORAL | Status: DC | PRN

## 2011-12-05 MED ORDER — PREGABALIN 75 MG PO CAPS: 75.0000 mg | ORAL_CAPSULE | Freq: Three times a day (TID) | ORAL | Status: DC

## 2011-12-05 MED ORDER — DIAZEPAM 5 MG PO TABS: 5.0000 mg | ORAL_TABLET | Freq: Two times a day (BID) | ORAL | Status: DC | PRN

## 2011-12-05 MED ORDER — ZOLPIDEM TARTRATE 5 MG PO TABS: 5.0000 mg | ORAL_TABLET | Freq: Every evening | ORAL | Status: DC | PRN

## 2011-12-05 MED ORDER — ONDANSETRON 8 MG PO TBDP: 8.0000 mg | ORAL_TABLET | Freq: Three times a day (TID) | ORAL | Status: AC | PRN

## 2011-12-05 NOTE — Patient Instructions (Signed)
Please follow up in 1-3 months as needed. Please take all medications as directed.  Please follow up with colonoscopy as scheduled.

## 2011-12-05 NOTE — Assessment & Plan Note (Signed)
Lab Results  Component Value Date   NA 137 12/03/2011   K 4.6 12/03/2011   CL 99 12/03/2011   CO2 23 12/03/2011   BUN 28* 12/03/2011   CREATININE 6.7* 12/03/2011   CREATININE 8.17* 10/15/2011    BP Readings from Last 3 Encounters:  12/05/11 163/93  12/03/11 148/82  10/30/11 160/90    Assessment: Hypertension control:  mildly elevated  Progress toward goals:  unchanged Barriers to meeting goals:  usually a little elevated before HD  Plan: Hypertension treatment:  continue current medications

## 2011-12-05 NOTE — Assessment & Plan Note (Signed)
Will change Gabapentin to Lyrica, as patient reports Gabapentin is not effective.

## 2011-12-05 NOTE — Assessment & Plan Note (Signed)
Stable, continue spiriva and albuterol.

## 2011-12-05 NOTE — Assessment & Plan Note (Signed)
Pt sober for over 1 month. No evidence of withdrawal, but does experience generalized anxiety as he is no longer drinking. Patient has been on valium in the past for his anxiety when he was not drinking. He understands the dangers associated with drinking alcohol and taking anti-anxiety medication. He assures me that he does not plan on drinking. Will give him a prescription for Valium and reassess in 1 month.

## 2011-12-05 NOTE — Assessment & Plan Note (Signed)
Likely secondary to chronic pancreatitis and gastritis. It does not help that patient takes 4-5 Goody Powders daily. Both GI and myself have emphasized avoiding NSAIDs, but pt reports nothing else helps. He reports feeling nauseated with plain tylenol. Will give him a prescription for tramadol. No indication for narcotic pain medication.

## 2011-12-05 NOTE — Assessment & Plan Note (Signed)
Sober for 1.5 months. No evidence of withdrawal. Reports going to [employer] Will continue to follow his progress.

## 2011-12-05 NOTE — Progress Notes (Signed)
Patient ID: [name], male   DOB: 05-18-1956, 56 y.o.   MRN: [id number] Subjective:     Patient ID: [name], male   DOB: [birth year], 56 y.o.   MRN: [id number]  Abdominal Pain    Pt is 56 year old man with history of chronic pancreatitis, chronic gastritis 2/2 alcohol and NSAID use, ESRD on HD and HTN who is here for routine f/u and medication refills.  He has no acute complaints.  He requests narcotic medications for treatment of pancreatic pain. He states Dr. Baltazar Apo always gives him pain medication. I then proceeded to tell him that I am Dr. Baltazar Apo and that while I may have given medication on occasion in the past, I do not feel opiate medication will be the answer to his problems. He said the old Dr. Baltazar Apo would have understood. Patient expresses his frustration at lack of treatment for his pain. He has been sober for 1.5 months now. He is currently living with his step son and states he has someone pick him up and drive him to his AA meetings.   Review of Systems  Gastrointestinal: Positive for abdominal pain.    Constitutional: Negative for fever, chills, diaphoresis, activity change, appetite change, fatigue and unexpected weight change.  HENT: Negative for hearing loss, congestion and neck stiffness.   Eyes: Negative for photophobia, pain and visual disturbance.  Respiratory: Negative for cough, chest tightness, shortness of breath and wheezing.   Cardiovascular: Negative for chest pain and palpitations.  Gastrointestinal: Positive for abdominal pain and as outlined above Genitourinary: Negative for dysuria, hematuria and difficulty urinating.  Musculoskeletal: Negative for joint swelling.  Neurological: Negative for dizziness, syncope, speech difficulty, weakness, numbness and headaches.      Objective:   Physical Exam VItal signs reviewed and stable. GEN: No apparent distress.  Alert and oriented x 3.  Pleasant, conversant, and cooperative to exam. HEENT: head is  autraumatic and normocephalic.  Neck is supple without palpable masses or lymphadenopathy.  No JVD or bruits.  Vision intact.  EOMI.  PERRLA.  Sclerae anicteric.  Conjunctivae without pallor or injection. Mucous membranes are moist.  Oropharynx is without erythema, exudates, or other abnormal lesions.   RESP:  Lungs are clear to ascultation bilaterally with good air movement.  No wheezes, ronchi, or rubs. CARDIOVASCULAR: regular rate, normal rhythm.   ABDOMEN: soft,  non-distended, diffusely TTP.  Bowels sounds present in all quadrants and normoactive.  No palpable masses. EXT: warm and dry.  Peripheral pulses equal, intact, and +2 globally.  No clubbing or cyanosis.  Trace edema in bilateral lower extremities. SKIN: warm and dry with normal turgor.  No rashes or abnormal lesions observed. NEURO: non-focal

## 2011-12-09 ENCOUNTER — Encounter (HOSPITAL_COMMUNITY): Payer: Self-pay | Admitting: Emergency Medicine

## 2011-12-09 ENCOUNTER — Emergency Department (HOSPITAL_COMMUNITY)
Admission: EM | Admit: 2011-12-09 | Discharge: 2011-12-09 | Disposition: A | Discharge status: Home / Self Care | Payer: Medicare Other | Attending: Emergency Medicine | Admitting: Emergency Medicine

## 2011-12-09 DIAGNOSIS — F411 Generalized anxiety disorder: Secondary | ICD-10-CM | POA: Insufficient documentation

## 2011-12-09 DIAGNOSIS — R1011 Right upper quadrant pain: Secondary | ICD-10-CM | POA: Insufficient documentation

## 2011-12-09 DIAGNOSIS — E875 Hyperkalemia: Secondary | ICD-10-CM | POA: Insufficient documentation

## 2011-12-09 DIAGNOSIS — N186 End stage renal disease: Secondary | ICD-10-CM | POA: Insufficient documentation

## 2011-12-09 DIAGNOSIS — K859 Acute pancreatitis without necrosis or infection, unspecified: Secondary | ICD-10-CM

## 2011-12-09 DIAGNOSIS — R112 Nausea with vomiting, unspecified: Secondary | ICD-10-CM | POA: Insufficient documentation

## 2011-12-09 DIAGNOSIS — J449 Chronic obstructive pulmonary disease, unspecified: Secondary | ICD-10-CM | POA: Insufficient documentation

## 2011-12-09 DIAGNOSIS — K861 Other chronic pancreatitis: Secondary | ICD-10-CM | POA: Insufficient documentation

## 2011-12-09 DIAGNOSIS — I120 Hypertensive chronic kidney disease with stage 5 chronic kidney disease or end stage renal disease: Secondary | ICD-10-CM | POA: Insufficient documentation

## 2011-12-09 DIAGNOSIS — R259 Unspecified abnormal involuntary movements: Secondary | ICD-10-CM | POA: Insufficient documentation

## 2011-12-09 DIAGNOSIS — Z992 Dependence on renal dialysis: Secondary | ICD-10-CM | POA: Insufficient documentation

## 2011-12-09 DIAGNOSIS — Z8619 Personal history of other infectious and parasitic diseases: Secondary | ICD-10-CM | POA: Insufficient documentation

## 2011-12-09 DIAGNOSIS — Z79899 Other long term (current) drug therapy: Secondary | ICD-10-CM | POA: Insufficient documentation

## 2011-12-09 DIAGNOSIS — J4489 Other specified chronic obstructive pulmonary disease: Secondary | ICD-10-CM | POA: Insufficient documentation

## 2011-12-09 LAB — COMPREHENSIVE METABOLIC PANEL
ALT: 30 U/L (ref 0–53)
Alkaline Phosphatase: 125 U/L — ABNORMAL HIGH (ref 39–117)
BUN: 57 mg/dL — ABNORMAL HIGH (ref 6–23)
Chloride: 87 mEq/L — ABNORMAL LOW (ref 96–112)
GFR calc Af Amer: 7 mL/min — ABNORMAL LOW (ref >90)
Glucose, Bld: 77 mg/dL (ref 70–99)
Potassium: 5.8 mEq/L — ABNORMAL HIGH (ref 3.5–5.1)
Sodium: 129 mEq/L — ABNORMAL LOW (ref 135–145)
Total Bilirubin: 0.2 mg/dL — ABNORMAL LOW (ref 0.3–1.2)

## 2011-12-09 LAB — DIFFERENTIAL
Lymphocytes Relative: 29 % (ref 12–46)
Lymphs Abs: 1.2 10*3/uL (ref 0.7–4.0)
Monocytes Relative: 11 % (ref 3–12)
Neutro Abs: 2.4 10*3/uL (ref 1.7–7.7)
Neutrophils Relative %: 58 % (ref 43–77)

## 2011-12-09 LAB — LIPASE, BLOOD: Lipase: 24 U/L (ref 11–59)

## 2011-12-09 LAB — CBC
Hemoglobin: 11.2 g/dL — ABNORMAL LOW (ref 13.0–17.0)
RBC: 3.48 MIL/uL — ABNORMAL LOW (ref 4.22–5.81)
WBC: 4.2 10*3/uL (ref 4.0–10.5)

## 2011-12-09 MED ORDER — ONDANSETRON HCL 4 MG/2ML IJ SOLN
4.0000 mg | Freq: Once | INTRAMUSCULAR | Status: AC
  Administered 2011-12-09: 4 mg via INTRAVENOUS
  Filled 2011-12-09 (×2): qty 2

## 2011-12-09 MED ORDER — PANTOPRAZOLE SODIUM 40 MG IV SOLR
40.0000 mg | Freq: Once | INTRAVENOUS | Status: AC
  Administered 2011-12-09: 40 mg via INTRAVENOUS
  Filled 2011-12-09 (×2): qty 40

## 2011-12-09 MED ORDER — HYDROMORPHONE HCL PF 1 MG/ML IJ SOLN
1.0000 mg | Freq: Once | INTRAMUSCULAR | Status: AC
  Administered 2011-12-09: 1 mg via INTRAVENOUS
  Filled 2011-12-09 (×2): qty 1

## 2011-12-09 MED ORDER — SODIUM POLYSTYRENE SULFONATE 15 GM/60ML PO SUSP
30.0000 g | Freq: Once | ORAL | Status: AC
  Administered 2011-12-09: 30 g via ORAL
  Filled 2011-12-09: qty 120

## 2011-12-09 MED ORDER — PANTOPRAZOLE SODIUM 40 MG PO TBEC: 40.0000 mg | DELAYED_RELEASE_TABLET | Freq: Every day | ORAL | Status: DC

## 2011-12-09 MED ORDER — SODIUM CHLORIDE 0.9 % IV SOLN
Freq: Once | INTRAVENOUS | Status: AC
  Administered 2011-12-09: 13:00:00 via INTRAVENOUS

## 2011-12-09 MED ORDER — OXYCODONE-ACETAMINOPHEN 5-325 MG PO TABS: 1.0000 | ORAL_TABLET | ORAL | Status: DC | PRN

## 2011-12-09 NOTE — ED Notes (Signed)
While this RN was in the room with pt, pt began to express hopelessness, stating "I don't see the point in living since I'm in so much pain. I'm so sick and I just don't see the point." This RN asked pt if he had any ideation of harming himself and he stated "No, I don't want to hurt myself, I have a daughter and I want to be around for her." Pt primary RN notified.
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
## 2011-12-09 NOTE — ED Notes (Signed)
Patient undressed and in a gown. Blood pressure cuff and pulse oximetry on. 
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
## 2011-12-09 NOTE — Discharge Instructions (Signed)
Call your dialysis doctor to see if you can have your dialysis done tomorrow. Followup with your primary care doctor in 2 days. Return to emergency Department if symptoms worsen.  Acute Pancreatitis The pancreas is a large gland located behind your stomach. It produces (secretes) enzymes. These enzymes help digest food. It also releases the hormones glucagon and insulin. These hormones help regulate blood sugar. When the pancreas becomes inflamed, the disease is called pancreatitis. Inflammation of the pancreas occurs when enzymes from the pancreas begin attacking and digesting the pancreas. CAUSES  Most cases ofsudden onset (acute) pancreatitis are caused by:  Alcohol abuse.   Gallstones.  Other less common causes are:  Some medications.   Exposure to certain chemicals   Infection.   Damage caused by an accident (trauma).   Surgery of the belly (abdomen).  SYMPTOMS  Acute pancreatitis usually begins with pain in the upper abdomen and may radiate to the back. This pain may last a couple days. The constant pain varies from mild to severe. The acute form of this disease may vary from mild, nonspecific abdominal pain to profound shock with coma. About 1 in 5 cases are severe. These patients become dehydrated and develop low blood pressure. In severe cases, bleeding into the pancreas can lead to shock and death. The lungs, heart, and kidneys may fail. DIAGNOSIS  Your caregiver will form a clinical opinion after giving you an exam. Laboratory work is used to confirm this diagnosis. Often,a digestive enzyme from the pancreas (serum amylase) and other enzymes are elevated. Sugars and fats (lipids) in the blood may be elevated. There may also be changes in the following levels: calcium, magnesium, potassium, chloride and bicarbonate (chemicals in the blood). X-rays, a CT scan, or ultrasound of your abdomen may be necessary to search for other causes of your abdominal pain. TREATMENT  Most  pancreatitis requires treatment of symptoms. Most acute attacks last a couple of days. Your caregiver can discuss the treatment options with you.  If complications occur, hospitalization may be necessary for pain control and intravenous (IV) fluid replacement.   Sometimes, a tube may be put into the stomach to control vomiting.   Food may not be allowed for 3 to 4 days. This gives the pancreas time to rest. Giving the pancreas a rest means there is no stimulation that would produce more enzymes and cause more damage.   Medicines (antibiotics) that kill germs may be given if infection is the cause.   Sometimes, surgery may be required.   Following an acute attack, your caregiver will determine the cause, if possible, and offer suggestions to prevent recurrences.  HOME CARE INSTRUCTIONS   Eat smaller, more frequent meals. This reduces the amount of digestive juices the pancreas produces.   Decrease the amount of fat in your diet. This may help reduce loose, diarrheal stools.   Drink enough water and fluids to keep your urine clear or pale yellow. This is to avoid dehydration which can cause increased pain.   Talk to your caregiver about pain relievers or other medicines that may help.   Avoid anything that may have triggered your pancreatitis (for example, alcohol).   Follow the diet advised by your caregiver. Do not advance the diet too soon.   Take medicines as prescribed.   Get plenty of rest.   Check your blood sugar at home as directed by your caregiver.   If your caregiver has given you a follow-up appointment, it is very important to keep  that appointment. Not keeping the appointment could result in a lasting (chronic) or permanent injury, pain, and disability. If there is any problem keeping the appointment, you must call to reschedule.  SEEK MEDICAL CARE IF:   You are not recovering in the time described by your caregiver.   You have persistent pain, weakness, or feel  sick to your stomach (nauseous).   You have recovered and then have another bout of pain.  SEEK IMMEDIATE MEDICAL CARE IF:   You are unable to eat or keep fluids down.   Your pain increases a lot or changes.   You have an oral temperature above 102 F (38.9 C), not controlled by medicine.   Your skin or the white part of your eyes look yellow (jaundice).   You develop vomiting.   You feel dizzy or faint.   Your blood sugar is high (over 300).  MAKE SURE YOU:   Understand these instructions.   Will watch your condition.   Will get help right away if you are not doing well or get worse.  Document Released: 07/22/2005 Document Revised: 07/11/2011 Document Reviewed: 03/04/2008 [facility] Patient Information 2012 [place], [place].  Acetaminophen; Oxycodone tablets What is this medicine? ACETAMINOPHEN; OXYCODONE (a set a MEE noe fen; ox i KOE done) is a pain reliever. It is used to treat mild to moderate pain. This medicine may be used for other purposes; ask your health care provider or pharmacist if you have questions. What should I tell my health care provider before I take this medicine? They need to know if you have any of these conditions: -brain tumor -Crohn's disease, inflammatory bowel disease, or ulcerative colitis -drink more than 3 alcohol containing drinks per day -drug abuse or addiction -head injury -heart or circulation problems -kidney disease or problems going to the bathroom -liver disease -lung disease, asthma, or breathing problems -an unusual or allergic reaction to acetaminophen, oxycodone, other opioid analgesics, other medicines, foods, dyes, or preservatives -pregnant or trying to get pregnant -breast-feeding How should I use this medicine? Take this medicine by mouth with a full glass of water. Follow the directions on the prescription label. Take your medicine at regular intervals. Do not take your medicine more often than directed. Talk to your  pediatrician regarding the use of this medicine in children. Special care may be needed. Patients over 5 years old may have a stronger reaction and need a smaller dose. Overdosage: If you think you have taken too much of this medicine contact a poison control center or emergency room at once. NOTE: This medicine is only for you. Do not share this medicine with others. What if I miss a dose? If you miss a dose, take it as soon as you can. If it is almost time for your next dose, take only that dose. Do not take double or extra doses. What may interact with this medicine? -alcohol or medicines that contain alcohol -antihistamines -barbiturates like amobarbital, butalbital, butabarbital, methohexital, pentobarbital, phenobarbital, thiopental, and secobarbital -benztropine -drugs for bladder problems like solifenacin, trospium, oxybutynin, tolterodine, hyoscyamine, and methscopolamine -drugs for breathing problems like ipratropium and tiotropium -drugs for certain stomach or intestine problems like propantheline, homatropine methylbromide, glycopyrrolate, atropine, belladonna, and dicyclomine -general anesthetics like etomidate, ketamine, nitrous oxide, propofol, desflurane, enflurane, halothane, isoflurane, and sevoflurane -medicines for depression, anxiety, or psychotic disturbances -medicines for pain like codeine, morphine, pentazocine, buprenorphine, butorphanol, nalbuphine, tramadol, and propoxyphene -medicines for sleep -muscle relaxants -naltrexone -phenothiazines like perphenazine, thioridazine, chlorpromazine, mesoridazine, fluphenazine, prochlorperazine,  promazine, and trifluoperazine -scopolamine -trihexyphenidyl This list may not describe all possible interactions. Give your health care provider a list of all the medicines, herbs, non-prescription drugs, or dietary supplements you use. Also tell them if you smoke, drink alcohol, or use illegal drugs. Some items may interact with your  medicine. What should I watch for while using this medicine? Tell your doctor or health care professional if your pain does not go away, if it gets worse, or if you have new or a different type of pain. You may develop tolerance to the medicine. Tolerance means that you will need a higher dose of the medication for pain relief. Tolerance is normal and is expected if you take this medicine for a long time. Do not suddenly stop taking your medicine because you may develop a severe reaction. Your body becomes used to the medicine. This does NOT mean you are addicted. Addiction is a behavior related to getting and using a drug for a nonmedical reason. If you have pain, you have a medical reason to take pain medicine. Your doctor will tell you how much medicine to take. If your doctor wants you to stop the medicine, the dose will be slowly lowered over time to avoid any side effects. You may get drowsy or dizzy. Do not drive, use machinery, or do anything that needs mental alertness until you know how this medicine affects you. Do not stand or sit up quickly, especially if you are an older patient. This reduces the risk of dizzy or fainting spells. Alcohol may interfere with the effect of this medicine. Avoid alcoholic drinks. The medicine will cause constipation. Try to have a bowel movement at least every 2 to 3 days. If you do not have a bowel movement for 3 days, call your doctor or health care professional. Do not take Tylenol (acetaminophen) or medicines that have acetaminophen with this medicine. Too much acetaminophen can be very dangerous. Many nonprescription medicines contain acetaminophen. Always read the labels carefully to avoid taking more acetaminophen. What side effects may I notice from receiving this medicine? Side effects that you should report to your doctor or health care professional as soon as possible: -allergic reactions like skin rash, itching or hives, swelling of the face, lips, or  tongue -breathing difficulties, wheezing -confusion -light headedness or fainting spells -severe stomach pain -yellowing of the skin or the whites of the eyes Side effects that usually do not require medical attention (report to your doctor or health care professional if they continue or are bothersome): -dizziness -drowsiness -nausea -vomiting This list may not describe all possible side effects. Call your doctor for medical advice about side effects. You may report side effects to FDA at 1-800-FDA-1088. Where should I keep my medicine? Keep out of the reach of children. This medicine can be abused. Keep your medicine in a safe place to protect it from theft. Do not share this medicine with anyone. Selling or giving away this medicine is dangerous and against the law. Store at room temperature between 20 and 25 degrees C (68 and 77 degrees F). Keep container tightly closed. Protect from light. Flush any unused medicines down the toilet. Do not use the medicine after the expiration date. NOTE: This sheet is a summary. It may not cover all possible information. If you have questions about this medicine, talk to your doctor, pharmacist, or health care provider.  2012, [employer]. (06/20/2008 10:01:21 AM)  Pantoprazole tablets What is this medicine? PANTOPRAZOLE (pan TOE pra  zole) prevents the production of acid in the stomach. It is used to treat gastroesophageal reflux disease (GERD), inflammation of the esophagus, and Zollinger-Ellison syndrome. This medicine may be used for other purposes; ask your health care provider or pharmacist if you have questions. What should I tell my health care provider before I take this medicine? They need to know if you have any of these conditions: -liver disease -low levels of magnesium in the blood -an unusual or allergic reaction to omeprazole, lansoprazole, pantoprazole, rabeprazole, other medicines, foods, dyes, or preservatives -pregnant  or trying to get pregnant -breast-feeding How should I use this medicine? Take this medicine by mouth. Swallow the tablets whole with a drink of water. Follow the directions on the prescription label. Do not crush, break, or chew. Take your medicine at regular intervals. Do not take your medicine more often than directed. Talk to your pediatrician regarding the use of this medicine in children. While this drug may be prescribed for children as young as 5 years for selected conditions, precautions do apply. Overdosage: If you think you have taken too much of this medicine contact a poison control center or emergency room at once. NOTE: This medicine is only for you. Do not share this medicine with others. What if I miss a dose? If you miss a dose, take it as soon as you can. If it is almost time for your next dose, take only that dose. Do not take double or extra doses. What may interact with this medicine? Do not take this medicine with any of the following medications: -atazanavir -nelfinavir This medicine may also interact with the following medications: -ampicillin -delavirdine -digoxin -diuretics -iron salts -medicines for fungal infections like ketoconazole, itraconazole and voriconazole -warfarin This list may not describe all possible interactions. Give your health care provider a list of all the medicines, herbs, non-prescription drugs, or dietary supplements you use. Also tell them if you smoke, drink alcohol, or use illegal drugs. Some items may interact with your medicine. What should I watch for while using this medicine? It can take several days before your stomach pain gets better. Check with your doctor or health care professional if your condition does not start to get better, or if it gets worse. You may need blood work done while you are taking this medicine. What side effects may I notice from receiving this medicine? Side effects that you should report to your doctor or  health care professional as soon as possible: -allergic reactions like skin rash, itching or hives, swelling of the face, lips, or tongue -bone, muscle or joint pain -breathing problems -chest pain or chest tightness -dark yellow or brown urine -dizziness -fast, irregular heartbeat -feeling faint or lightheaded -fever or sore throat -muscle spasm -palpitations -redness, blistering, peeling or loosening of the skin, including inside the mouth -seizures -tremors -unusual bleeding or bruising -unusually weak or tired -yellowing of the eyes or skin Side effects that usually do not require medical attention (Report these to your doctor or health care professional if they continue or are bothersome.): -constipation -diarrhea -dry mouth -headache -nausea This list may not describe all possible side effects. Call your doctor for medical advice about side effects. You may report side effects to FDA at 1-800-FDA-1088. Where should I keep my medicine? Keep out of the reach of children. Store at room temperature between 15 and 30 degrees C (59 and 86 degrees F). Protect from light and moisture. Throw away any unused medicine after the expiration date.  NOTE: This sheet is a summary. It may not cover all possible information. If you have questions about this medicine, talk to your doctor, pharmacist, or health care provider.  2012, [employer]. (10/10/2009 12:03:53 PM)

## 2011-12-19 ENCOUNTER — Inpatient Hospital Stay (HOSPITAL_COMMUNITY)
Admission: EM | Admit: 2011-12-19 | Discharge: 2011-12-23 | DRG: 640 | Disposition: A | Discharge status: Home / Self Care | Payer: Medicare Other | Attending: Internal Medicine | Admitting: Internal Medicine

## 2011-12-19 ENCOUNTER — Emergency Department (HOSPITAL_COMMUNITY): Payer: Medicare Other

## 2011-12-19 ENCOUNTER — Encounter (HOSPITAL_COMMUNITY): Payer: Self-pay | Admitting: *Deleted

## 2011-12-19 ENCOUNTER — Inpatient Hospital Stay (HOSPITAL_COMMUNITY): Payer: Medicare Other

## 2011-12-19 DIAGNOSIS — F1910 Other psychoactive substance abuse, uncomplicated: Secondary | ICD-10-CM | POA: Diagnosis present

## 2011-12-19 DIAGNOSIS — N186 End stage renal disease: Secondary | ICD-10-CM | POA: Diagnosis present

## 2011-12-19 DIAGNOSIS — K279 Peptic ulcer, site unspecified, unspecified as acute or chronic, without hemorrhage or perforation: Secondary | ICD-10-CM | POA: Diagnosis present

## 2011-12-19 DIAGNOSIS — F1010 Alcohol abuse, uncomplicated: Secondary | ICD-10-CM | POA: Clinically undetermined

## 2011-12-19 DIAGNOSIS — R110 Nausea: Secondary | ICD-10-CM

## 2011-12-19 DIAGNOSIS — R1084 Generalized abdominal pain: Secondary | ICD-10-CM | POA: Diagnosis present

## 2011-12-19 DIAGNOSIS — B1920 Unspecified viral hepatitis C without hepatic coma: Secondary | ICD-10-CM | POA: Diagnosis present

## 2011-12-19 DIAGNOSIS — E874 Mixed disorder of acid-base balance: Secondary | ICD-10-CM | POA: Diagnosis present

## 2011-12-19 DIAGNOSIS — G629 Polyneuropathy, unspecified: Secondary | ICD-10-CM

## 2011-12-19 DIAGNOSIS — E875 Hyperkalemia: Secondary | ICD-10-CM

## 2011-12-19 DIAGNOSIS — R109 Unspecified abdominal pain: Secondary | ICD-10-CM

## 2011-12-19 DIAGNOSIS — I120 Hypertensive chronic kidney disease with stage 5 chronic kidney disease or end stage renal disease: Secondary | ICD-10-CM | POA: Diagnosis present

## 2011-12-19 DIAGNOSIS — F10939 Alcohol use, unspecified with withdrawal, unspecified: Secondary | ICD-10-CM | POA: Diagnosis present

## 2011-12-19 DIAGNOSIS — F1020 Alcohol dependence, uncomplicated: Secondary | ICD-10-CM | POA: Diagnosis present

## 2011-12-19 DIAGNOSIS — K861 Other chronic pancreatitis: Secondary | ICD-10-CM | POA: Diagnosis present

## 2011-12-19 DIAGNOSIS — F419 Anxiety disorder, unspecified: Secondary | ICD-10-CM | POA: Diagnosis present

## 2011-12-19 DIAGNOSIS — J449 Chronic obstructive pulmonary disease, unspecified: Secondary | ICD-10-CM | POA: Diagnosis present

## 2011-12-19 DIAGNOSIS — F10239 Alcohol dependence with withdrawal, unspecified: Secondary | ICD-10-CM

## 2011-12-19 DIAGNOSIS — R55 Syncope and collapse: Secondary | ICD-10-CM | POA: Diagnosis present

## 2011-12-19 DIAGNOSIS — F17200 Nicotine dependence, unspecified, uncomplicated: Secondary | ICD-10-CM | POA: Diagnosis present

## 2011-12-19 DIAGNOSIS — Z992 Dependence on renal dialysis: Secondary | ICD-10-CM

## 2011-12-19 DIAGNOSIS — J4489 Other specified chronic obstructive pulmonary disease: Secondary | ICD-10-CM | POA: Diagnosis present

## 2011-12-19 DIAGNOSIS — I10 Essential (primary) hypertension: Secondary | ICD-10-CM | POA: Diagnosis present

## 2011-12-19 HISTORY — DX: Chronic viral hepatitis C: B18.2

## 2011-12-19 LAB — COMPREHENSIVE METABOLIC PANEL
ALT: 20 U/L (ref 0–53)
AST: 36 U/L (ref 0–37)
AST: 32 U/L (ref 0–37)
Albumin: 3.1 g/dL — ABNORMAL LOW (ref 3.5–5.2)
Alkaline Phosphatase: 104 U/L (ref 39–117)
BUN: 58 mg/dL — ABNORMAL HIGH (ref 6–23)
CO2: 20 mEq/L (ref 19–32)
Calcium: 8.4 mg/dL (ref 8.4–10.5)
Calcium: 8.8 mg/dL (ref 8.4–10.5)
Chloride: 91 mEq/L — ABNORMAL LOW (ref 96–112)
Chloride: 93 mEq/L — ABNORMAL LOW (ref 96–112)
Creatinine, Ser: 11.33 mg/dL — ABNORMAL HIGH (ref 0.50–1.35)
GFR calc Af Amer: 5 mL/min — ABNORMAL LOW (ref >90)
GFR calc non Af Amer: 4 mL/min — ABNORMAL LOW (ref >90)
Glucose, Bld: 23 mg/dL — CL (ref 70–99)
Sodium: 135 mEq/L (ref 135–145)
Total Bilirubin: 0.2 mg/dL — ABNORMAL LOW (ref 0.3–1.2)
Total Bilirubin: 0.2 mg/dL — ABNORMAL LOW (ref 0.3–1.2)
Total Protein: 7.3 g/dL (ref 6.0–8.3)

## 2011-12-19 LAB — DIFFERENTIAL
Basophils Absolute: 0 10*3/uL (ref 0.0–0.1)
Basophils Relative: 0 % (ref 0–1)
Eosinophils Absolute: 0 10*3/uL (ref 0.0–0.7)
Monocytes Absolute: 0.3 10*3/uL (ref 0.1–1.0)
Monocytes Relative: 3 % (ref 3–12)
Neutro Abs: 7.4 10*3/uL (ref 1.7–7.7)

## 2011-12-19 LAB — GLUCOSE, CAPILLARY
Glucose-Capillary: 38 mg/dL — CL (ref 70–99)
Glucose-Capillary: 120 mg/dL — ABNORMAL HIGH (ref 70–99)

## 2011-12-19 LAB — CBC
HCT: 30 % — ABNORMAL LOW (ref 39.0–52.0)
MCH: 31.7 pg (ref 26.0–34.0)
MCHC: 32 g/dL (ref 30.0–36.0)
RDW: 18.3 % — ABNORMAL HIGH (ref 11.5–15.5)

## 2011-12-19 LAB — LIPASE, BLOOD: Lipase: 15 U/L (ref 11–59)

## 2011-12-19 LAB — OCCULT BLOOD, POC DEVICE: Fecal Occult Bld: NEGATIVE

## 2011-12-19 LAB — ETHANOL: Alcohol, Ethyl (B): 25 mg/dL — ABNORMAL HIGH (ref 0–11)

## 2011-12-19 MED ORDER — TIOTROPIUM BROMIDE MONOHYDRATE 18 MCG IN CAPS
18.0000 ug | ORAL_CAPSULE | Freq: Every day | RESPIRATORY_TRACT | Status: DC
  Administered 2011-12-21 – 2011-12-22 (×2): 18 ug via RESPIRATORY_TRACT
  Filled 2011-12-19: qty 5

## 2011-12-19 MED ORDER — PANCRELIPASE (LIP-PROT-AMYL) 12000-38000 UNITS PO CPEP
1.0000 | ORAL_CAPSULE | Freq: Three times a day (TID) | ORAL | Status: DC
  Administered 2011-12-21 – 2011-12-23 (×9): 1 via ORAL
  Filled 2011-12-19 (×13): qty 1

## 2011-12-19 MED ORDER — LORAZEPAM 2 MG/ML IJ SOLN
2.0000 mg | Freq: Four times a day (QID) | INTRAMUSCULAR | Status: DC
  Administered 2011-12-19 – 2011-12-22 (×8): 2 mg via INTRAVENOUS
  Administered 2011-12-23: 1 mg via INTRAVENOUS
  Filled 2011-12-19 (×9): qty 1

## 2011-12-19 MED ORDER — CALCIUM GLUCONATE 10 % IV SOLN
1.0000 g | Freq: Once | INTRAVENOUS | Status: AC
  Administered 2011-12-19: 1 g via INTRAVENOUS

## 2011-12-19 MED ORDER — FOLIC ACID 1 MG PO TABS
1.0000 mg | ORAL_TABLET | Freq: Every day | ORAL | Status: DC
  Administered 2011-12-21 – 2011-12-23 (×3): 1 mg via ORAL
  Filled 2011-12-19 (×4): qty 1

## 2011-12-19 MED ORDER — INSULIN ASPART 100 UNIT/ML ~~LOC~~ SOLN
10.0000 [IU] | Freq: Once | SUBCUTANEOUS | Status: AC
  Administered 2011-12-19: 10 [IU] via INTRAVENOUS
  Filled 2011-12-19: qty 1

## 2011-12-19 MED ORDER — SODIUM POLYSTYRENE SULFONATE 15 GM/60ML PO SUSP
30.0000 g | Freq: Once | ORAL | Status: DC
  Filled 2011-12-19: qty 120

## 2011-12-19 MED ORDER — LANTHANUM CARBONATE 500 MG PO CHEW
1000.0000 mg | CHEWABLE_TABLET | Freq: Three times a day (TID) | ORAL | Status: DC
  Filled 2011-12-19 (×4): qty 2

## 2011-12-19 MED ORDER — VITAMIN B-1 100 MG PO TABS
100.0000 mg | ORAL_TABLET | Freq: Every day | ORAL | Status: DC
  Administered 2011-12-21 – 2011-12-23 (×3): 100 mg via ORAL
  Filled 2011-12-19 (×4): qty 1

## 2011-12-19 MED ORDER — HEPARIN SODIUM (PORCINE) 5000 UNIT/ML IJ SOLN
5000.0000 [IU] | Freq: Three times a day (TID) | INTRAMUSCULAR | Status: DC
  Administered 2011-12-20 – 2011-12-23 (×8): 5000 [IU] via SUBCUTANEOUS
  Filled 2011-12-19 (×14): qty 1

## 2011-12-19 MED ORDER — DEXTROSE 50 % IV SOLN
50.0000 mL | Freq: Once | INTRAVENOUS | Status: AC
  Administered 2011-12-19: 50 mL via INTRAVENOUS

## 2011-12-19 MED ORDER — ALBUTEROL SULFATE HFA 108 (90 BASE) MCG/ACT IN AERS: 2.0000 | INHALATION_SPRAY | RESPIRATORY_TRACT | Status: DC | PRN

## 2011-12-19 MED ORDER — PANTOPRAZOLE SODIUM 40 MG PO TBEC
40.0000 mg | DELAYED_RELEASE_TABLET | Freq: Every day | ORAL | Status: DC
  Administered 2011-12-21 – 2011-12-23 (×3): 40 mg via ORAL
  Filled 2011-12-19 (×3): qty 1

## 2011-12-19 MED ORDER — LATANOPROST 0.005 % OP SOLN
1.0000 [drp] | Freq: Every day | OPHTHALMIC | Status: DC
  Administered 2011-12-20 – 2011-12-23 (×4): 1 [drp] via OPHTHALMIC
  Filled 2011-12-19: qty 2.5

## 2011-12-19 MED ORDER — CALCIUM GLUCONATE 10 % IV SOLN
INTRAVENOUS | Status: AC
  Filled 2011-12-19: qty 10

## 2011-12-19 MED ORDER — SODIUM CHLORIDE 0.9 % IJ SOLN: 3.0000 mL | INTRAMUSCULAR | Status: DC | PRN

## 2011-12-19 MED ORDER — SODIUM CHLORIDE 0.9 % IV SOLN
INTRAVENOUS | Status: AC
  Filled 2011-12-19: qty 100

## 2011-12-19 MED ORDER — ONDANSETRON HCL 4 MG/2ML IJ SOLN
4.0000 mg | Freq: Once | INTRAMUSCULAR | Status: AC
  Administered 2011-12-19: 4 mg via INTRAVENOUS
  Filled 2011-12-19: qty 2

## 2011-12-19 MED ORDER — SODIUM CHLORIDE 0.9 % IV SOLN: 250.0000 mL | INTRAVENOUS | Status: DC | PRN

## 2011-12-19 MED ORDER — SODIUM CHLORIDE 0.9 % IJ SOLN
3.0000 mL | Freq: Two times a day (BID) | INTRAMUSCULAR | Status: DC
  Administered 2011-12-20 – 2011-12-23 (×8): 3 mL via INTRAVENOUS

## 2011-12-19 MED ORDER — CLONIDINE HCL 0.3 MG PO TABS
0.3000 mg | ORAL_TABLET | Freq: Two times a day (BID) | ORAL | Status: DC
  Administered 2011-12-20 – 2011-12-23 (×7): 0.3 mg via ORAL
  Filled 2011-12-19 (×9): qty 1

## 2011-12-19 MED ORDER — DEXTROSE 50 % IV SOLN
25.0000 g | Freq: Once | INTRAVENOUS | Status: AC
  Administered 2011-12-19: 25 g via INTRAVENOUS
  Filled 2011-12-19: qty 50

## 2011-12-19 MED ORDER — CALCIUM CARBONATE-VITAMIN D 500-200 MG-UNIT PO TABS
1.0000 | ORAL_TABLET | Freq: Three times a day (TID) | ORAL | Status: DC
  Filled 2011-12-19 (×2): qty 1

## 2011-12-19 MED ORDER — DEXTROSE 50 % IV SOLN
INTRAVENOUS | Status: AC
  Administered 2011-12-19: 50 mL
  Filled 2011-12-19: qty 50

## 2011-12-19 MED ORDER — DEXTROSE-NACL 5-0.9 % IV SOLN
INTRAVENOUS | Status: DC
  Administered 2011-12-19: 22:00:00 via INTRAVENOUS

## 2011-12-19 MED ORDER — CALCIUM ACETATE 667 MG PO CAPS
667.0000 mg | ORAL_CAPSULE | Freq: Three times a day (TID) | ORAL | Status: DC
  Filled 2011-12-19 (×4): qty 1

## 2011-12-19 MED ORDER — SODIUM CHLORIDE 0.9 % IV SOLN
Freq: Once | INTRAVENOUS | Status: AC
  Administered 2011-12-19: 18:00:00 via INTRAVENOUS

## 2011-12-19 MED ORDER — SODIUM BICARBONATE 8.4 % IV SOLN
50.0000 meq | Freq: Once | INTRAVENOUS | Status: AC
  Administered 2011-12-19: 50 meq via INTRAVENOUS
  Filled 2011-12-19: qty 50

## 2011-12-19 MED ORDER — DEXTROSE 50 % IV SOLN
INTRAVENOUS | Status: AC
  Filled 2011-12-19: qty 50

## 2011-12-19 MED ORDER — LORAZEPAM 2 MG/ML IJ SOLN
1.0000 mg | Freq: Once | INTRAMUSCULAR | Status: AC
  Administered 2011-12-19: 1 mg via INTRAVENOUS
  Filled 2011-12-19: qty 1

## 2011-12-19 MED ORDER — CALCIUM CHLORIDE 10 % IV SOLN
1.0000 g | Freq: Once | INTRAVENOUS | Status: DC
Start: 2011-12-19 — End: 2011-12-19

## 2011-12-19 MED ORDER — ONDANSETRON HCL 4 MG PO TABS: 4.0000 mg | ORAL_TABLET | ORAL | Status: DC | PRN

## 2011-12-19 MED ORDER — PREGABALIN 25 MG PO CAPS
75.0000 mg | ORAL_CAPSULE | Freq: Three times a day (TID) | ORAL | Status: DC
  Administered 2011-12-21 – 2011-12-22 (×5): 75 mg via ORAL
  Filled 2011-12-19 (×5): qty 3

## 2011-12-19 MED ORDER — HYDROMORPHONE HCL PF 1 MG/ML IJ SOLN
1.0000 mg | INTRAMUSCULAR | Status: DC | PRN
  Administered 2011-12-20 – 2011-12-23 (×9): 1 mg via INTRAVENOUS
  Filled 2011-12-19 (×9): qty 1

## 2011-12-19 NOTE — ED Notes (Signed)
Patient transported to X-ray 

## 2011-12-19 NOTE — ED Notes (Signed)
[id number] [id number]

## 2011-12-19 NOTE — ED Notes (Signed)
D5NS at  150 as ordered by Dr. Preston Fleeting

## 2011-12-19 NOTE — ED Notes (Signed)
Pt is from home, called EMS for generalized weakness, abdominal pain r/t pancreatitis, and ETOH withdrawals. Pt normally drinks 6 Four Lokos per day, has only had one today at 0930.

## 2011-12-19 NOTE — ED Notes (Signed)
Lab called with glucose 23 MD notified
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
## 2011-12-19 NOTE — ED Notes (Signed)
Nurse unable to take report.  Number and name given for her to call back.
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
## 2011-12-19 NOTE — ED Notes (Signed)
Patient answers to his name and then grabs his right side of his abd.  Continues to have pain

## 2011-12-19 NOTE — ED Notes (Signed)
2603-01 Ready 

## 2011-12-19 NOTE — ED Notes (Signed)
Dialysis fistula present left upper arm. Bruit and thrill present.

## 2011-12-20 ENCOUNTER — Inpatient Hospital Stay (HOSPITAL_COMMUNITY): Payer: Medicare Other

## 2011-12-20 ENCOUNTER — Encounter (HOSPITAL_COMMUNITY): Payer: Self-pay

## 2011-12-20 DIAGNOSIS — E875 Hyperkalemia: Principal | ICD-10-CM

## 2011-12-20 LAB — BASIC METABOLIC PANEL
BUN: 20 mg/dL (ref 6–23)
CO2: 26 mEq/L (ref 19–32)
Calcium: 9.3 mg/dL (ref 8.4–10.5)
Creatinine, Ser: 5.69 mg/dL — ABNORMAL HIGH (ref 0.50–1.35)
GFR calc Af Amer: 5 mL/min — ABNORMAL LOW (ref >90)
GFR calc non Af Amer: 4 mL/min — ABNORMAL LOW (ref >90)
Glucose, Bld: 70 mg/dL (ref 70–99)
Potassium: 7.9 mEq/L (ref 3.5–5.1)
Sodium: 129 mEq/L — ABNORMAL LOW (ref 135–145)
Sodium: 138 mEq/L (ref 135–145)

## 2011-12-20 LAB — PROTIME-INR
INR: 1.09 (ref 0.00–1.49)
Prothrombin Time: 14.3 seconds (ref 11.6–15.2)

## 2011-12-20 LAB — GLUCOSE, CAPILLARY
Glucose-Capillary: 85 mg/dL (ref 70–99)
Glucose-Capillary: 62 mg/dL — ABNORMAL LOW (ref 70–99)
Glucose-Capillary: 76 mg/dL (ref 70–99)

## 2011-12-20 LAB — BLOOD GAS, ARTERIAL
Acid-Base Excess: 1.3 mmol/L (ref 0.0–2.0)
Bicarbonate: 18.7 mEq/L — ABNORMAL LOW (ref 20.0–24.0)
Bicarbonate: 26.6 mEq/L — ABNORMAL HIGH (ref 20.0–24.0)
O2 Saturation: 92.9 %
Patient temperature: 94.8
TCO2: 20.4 mmol/L (ref 0–100)
pCO2 arterial: 52.3 mmHg — ABNORMAL HIGH (ref 35.0–45.0)
pH, Arterial: 7.188 — CL (ref 7.350–7.450)
pH, Arterial: 7.327 — ABNORMAL LOW (ref 7.350–7.450)
pO2, Arterial: 67.7 mmHg — ABNORMAL LOW (ref 80.0–100.0)

## 2011-12-20 LAB — CARDIAC PANEL(CRET KIN+CKTOT+MB+TROPI)
CK, MB: 7.4 ng/mL (ref 0.3–4.0)
CK, MB: 7 ng/mL (ref 0.3–4.0)
Relative Index: 2.9 — ABNORMAL HIGH (ref 0.0–2.5)
Troponin I: <0.3 ng/mL (ref <0.30)
Troponin I: <0.3 ng/mL (ref <0.30)

## 2011-12-20 LAB — RAPID URINE DRUG SCREEN, HOSP PERFORMED
Amphetamines: NOT DETECTED
Cocaine: NOT DETECTED
Opiates: NOT DETECTED
Tetrahydrocannabinol: NOT DETECTED

## 2011-12-20 LAB — CBC
Hemoglobin: 10.9 g/dL — ABNORMAL LOW (ref 13.0–17.0)
RBC: 3.39 MIL/uL — ABNORMAL LOW (ref 4.22–5.81)

## 2011-12-20 LAB — KETONES, QUALITATIVE: Acetone, Bld: NEGATIVE

## 2011-12-20 MED ORDER — RENA-VITE PO TABS
1.0000 | ORAL_TABLET | Freq: Every day | ORAL | Status: DC
  Administered 2011-12-20 – 2011-12-22 (×3): 1 via ORAL
  Filled 2011-12-20 (×4): qty 1

## 2011-12-20 MED ORDER — PARICALCITOL 5 MCG/ML IV SOLN
1.0000 ug | INTRAVENOUS | Status: DC
  Administered 2011-12-23: 1 ug via INTRAVENOUS
  Filled 2011-12-20: qty 0.2

## 2011-12-20 MED ORDER — LIDOCAINE-PRILOCAINE 2.5-2.5 % EX CREA
1.0000 "application " | TOPICAL_CREAM | CUTANEOUS | Status: DC | PRN
  Filled 2011-12-20: qty 5

## 2011-12-20 MED ORDER — NEPRO/CARBSTEADY PO LIQD
237.0000 mL | ORAL | Status: DC | PRN
  Filled 2011-12-20: qty 237

## 2011-12-20 MED ORDER — DEXTROSE 50 % IV SOLN: 50.0000 mL | INTRAVENOUS | Status: AC

## 2011-12-20 MED ORDER — HEPARIN SODIUM (PORCINE) 1000 UNIT/ML DIALYSIS: 2000.0000 [IU] | Freq: Once | INTRAMUSCULAR | Status: AC

## 2011-12-20 MED ORDER — ALBUTEROL SULFATE (5 MG/ML) 0.5% IN NEBU
5.0000 mg | INHALATION_SOLUTION | RESPIRATORY_TRACT | Status: AC
  Administered 2011-12-20: 5 mg via RESPIRATORY_TRACT
  Filled 2011-12-20: qty 0.5

## 2011-12-20 MED ORDER — DEXTROSE-NACL 5-0.9 % IV SOLN
INTRAVENOUS | Status: DC
Start: 2011-12-20 — End: 2011-12-20
  Administered 2011-12-20 (×2): via INTRAVENOUS

## 2011-12-20 MED ORDER — LANTHANUM CARBONATE 500 MG PO CHEW
1000.0000 mg | CHEWABLE_TABLET | Freq: Three times a day (TID) | ORAL | Status: DC
  Administered 2011-12-21 – 2011-12-23 (×9): 1000 mg via ORAL
  Filled 2011-12-20 (×11): qty 2

## 2011-12-20 MED ORDER — SODIUM BICARBONATE 8.4 % IV SOLN: INTRAVENOUS | Status: DC

## 2011-12-20 MED ORDER — SODIUM CHLORIDE 0.9 % IV SOLN: 100.0000 mL | INTRAVENOUS | Status: DC | PRN

## 2011-12-20 MED ORDER — CALCIUM ACETATE 667 MG PO CAPS
667.0000 mg | ORAL_CAPSULE | Freq: Three times a day (TID) | ORAL | Status: DC
  Administered 2011-12-21 – 2011-12-23 (×9): 667 mg via ORAL
  Filled 2011-12-20 (×11): qty 1

## 2011-12-20 MED ORDER — LORAZEPAM 2 MG/ML IJ SOLN
INTRAMUSCULAR | Status: AC
  Administered 2011-12-20: 2 mg via INTRAVENOUS
  Filled 2011-12-20: qty 1

## 2011-12-20 MED ORDER — ALTEPLASE 2 MG IJ SOLR: 2.0000 mg | Freq: Once | INTRAMUSCULAR | Status: AC | PRN

## 2011-12-20 MED ORDER — INSULIN REGULAR BOLUS VIA INFUSION
10.0000 [IU] | INTRAVENOUS | Status: AC
  Filled 2011-12-20: qty 10

## 2011-12-20 MED ORDER — PENTAFLUOROPROP-TETRAFLUOROETH EX AERO: 1.0000 "application " | INHALATION_SPRAY | CUTANEOUS | Status: DC | PRN

## 2011-12-20 MED ORDER — SODIUM CHLORIDE 0.9 % IV SOLN
1.0000 g | INTRAVENOUS | Status: DC
  Filled 2011-12-20: qty 10

## 2011-12-20 MED ORDER — DARBEPOETIN ALFA-POLYSORBATE 25 MCG/0.42ML IJ SOLN
12.5000 ug | INTRAMUSCULAR | Status: DC
  Administered 2011-12-23: 12.5 ug via INTRAVENOUS
  Filled 2011-12-20: qty 0.42

## 2011-12-20 MED ORDER — HEPARIN SODIUM (PORCINE) 1000 UNIT/ML DIALYSIS: 1000.0000 [IU] | INTRAMUSCULAR | Status: DC | PRN

## 2011-12-20 MED ORDER — LIDOCAINE HCL (PF) 1 % IJ SOLN
5.0000 mL | INTRAMUSCULAR | Status: DC | PRN
  Filled 2011-12-20: qty 5

## 2011-12-20 NOTE — Progress Notes (Signed)
Utilization Review Completed.  Karandeep Resende T  12/20/2011  

## 2011-12-20 NOTE — Progress Notes (Addendum)
Nurse reports that patient fell on the ground. Patient was reported to be on the his knees with his hands trying to reach some coins underneath his bed.  S: patient states that he tried to get out of the bed and fell down on the ground. He states," I do not remember that I hit my head.  : General: mild drowsy with his eyes closed, but easily arousal and able to communicate his needs.      Head: old skin abrasion noted just above his left eyebrow. No skin/bony changes on his face and skull. No obvious trauma noted.      Lungs CTA      Heart: RRR      Skin: no new skin abrasion/breakdown.        Ext: No joint/bony deformity noted. Good ROM on upper and lower extremities (hips, knees and ankles).        Neuro: drowsy, oriented to self and place.       Somewhat difficult neuro exam due to his drowsiness and tremors from alcohol withdrawal vs psychological disease process.       PERRL, no focal neuro deficit noted.   A/P - Given above description. It is less likely that patient hit his head when he fell. Patient denies it and no new trauma or focal neuro deficit indicated on his physical exam. Though he c/o mild headache, it could be explained by his elevated BP.  - He has had head CT yesterday on admission. The risk of radiation outweighs the benefit of repeat head CT given his low probability of head injury.  - will obtain bed alarm, bedside sitter and neuro check every 4 hours - will consider head CT should his mental status changed or he developed any focal neuro deficit. - Above treatment plan discussed with Dr. Rogelia Boga.

## 2011-12-20 NOTE — Progress Notes (Signed)
CRITICAL VALUE ALERT  Critical value received:  K=7.9   Date of notification:  12/20/11  Time of notification:  0756  Critical value read back:yes  Nurse who received alert:  Akshara Blumenthal, RN  MD notified (1st page):  Dr. Conley Rolls  Time of first page:  0757  MD notified (2nd page): Dr. Scot Dock  Time of second page: 0800 MD notified (3rd page): Dr. Rogelia Boga @ [phone]  Responding MD:  Dr. Yaakov Guthrie Time MD responded:  0806  Dr. Yaakov Guthrie is also notified again of pt's last blood gas value and that pt is very lethargic. Will continue monitor pt.
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
## 2011-12-20 NOTE — Progress Notes (Signed)
Subjective: Nurse reported critical hyperkalemia this am and patient was brought to HD unit for urgent hemodialysis.  Patient is in dialysis now.  still lethargic. Easily arousal. Follows simple commands. Hands shaking/tremors noted.  Objective: Vital signs in last 24 hours: Filed Vitals:   12/20/11 1800 12/20/11 1815 12/20/11 1821 12/20/11 1914  BP: 198/107 195/124 186/116 195/108  Pulse: 98 99 101   Temp:      TempSrc:      Resp: 8 23 19    Height:      Weight:      SpO2: 90% 94% 89%    Weight change:   Intake/Output Summary (Last 24 hours) at 12/20/11 2001 Last data filed at 12/20/11 1306  Gross per 24 hour  Intake   1950 ml  Output   5382 ml  Net  -3432 ml   General: mild distress due to acute illness. Eyes: PERRLA. Neck: supple, no masses, no carotid bruits appreciated, no JVD Lungs: CTA B/L Heart: RRR, no M/G/R GU/Abdomen: BS normoactive. Soft, tenderness  to palpation RUQ.  No masses or organomegaly appreciated Extremities: No pretibial edema, distal pedal pulses intact bilaterally, multiple excoriations and ecchymosis on all extremities most notable bilateral hands, left forearm and left shin Neurologic: lethargic, easily arousal and follow simple commands, grossly non-focal, visible hands and arms shakes noted.   Lab Results: Basic Metabolic Panel:  Lab 12/20/11 2130 12/20/11 0655  Cloy Cozzens 138 129*  K 4.1 7.9*  CL 94* 92*  CO2 26 15*  GLUCOSE 70 91  BUN 20 62*  CREATININE 5.69* 11.46*  CALCIUM 9.3 8.4  MG -- --  PHOS -- --   Liver Function Tests:  Lab 12/19/11 2018 12/19/11 1739  AST 32 36  ALT 20 22  ALKPHOS 104 113  BILITOT 0.2* 0.2*  PROT 7.2 7.3  ALBUMIN 3.1* 3.1*    Lab 12/19/11 1739  LIPASE 15  AMYLASE --   CBC:  Lab 12/20/11 0655 12/19/11 1739  WBC 9.2 9.1  NEUTROABS -- 7.4  HGB 10.9* 9.6*  HCT 34.3* 30.0*  MCV 101.2* 99.0  PLT 99* 109*   Cardiac Enzymes:  Lab 12/20/11 1638 12/20/11 0655 12/19/11 2333  CKTOTAL 242* 291* 318*    CKMB 7.0* 7.4* 7.2*  CKMBINDEX -- -- --  TROPONINI <0.30 <0.30 <0.30   CBG:  Lab 12/20/11 1625 12/20/11 1624 12/20/11 0832 12/20/11 0628 12/20/11 0120 12/19/11 2306  GLUCAP 76 62* 99 85 97 71   Coagulation:  Lab 12/19/11 2334  LABPROT 14.3  INR 1.09   Urine Drug Screen: Drugs of Abuse     Component Value Date/Time   LABOPIA NONE DETECTED 12/20/2011 0026   COCAINSCRNUR NONE DETECTED 12/20/2011 0026   COCAINSCRNUR NEG 09/27/2010 1551   LABBENZ POSITIVE* 12/20/2011 0026   LABBENZ NEG 09/27/2010 1551   AMPHETMU NONE DETECTED 12/20/2011 0026   AMPHETMU NEG 09/27/2010 1551   THCU NONE DETECTED 12/20/2011 0026   LABBARB NONE DETECTED 12/20/2011 0026    Alcohol Level:  Lab 12/19/11 1739  ETH 25*   Micro Results: Recent Results (from the past 240 hour(s))  MRSA PCR SCREENING     Status: Normal   Collection Time   12/19/11 11:03 PM      Component Value Range Status Comment   MRSA by PCR NEGATIVE  NEGATIVE  Final    Studies/Results: Dg Chest 1 View  12/19/2011  *RADIOLOGY REPORT*  Clinical Data: Syncope.  Fall.  Pain.  CHEST - 1 VIEW  Comparison: 10/24/2011  Findings:  Cardiomegaly with cephalization of blood flow noted, compatible with pulmonary venous hypertension.  No pneumothorax is observed.  Indistinct density along the lingula probably represents the scarring shown on prior chest CT.  No pneumothorax or pulmonary contusion observed.  IMPRESSION:  1.  Cardiomegaly and pulmonary venous hypertension. 2.  Mild lingular scarring.  Original Report Authenticated By: Dellia Cloud, M.D.   Dg Forearm Left  12/19/2011  *RADIOLOGY REPORT*  Clinical Data: Syncope.  Fall.  Left forearm pain and bruising distally.  LEFT FOREARM - 2 VIEW  Comparison: None.  Findings: Vascular clips and stent projects in the vicinity of the elbow.  Subcutaneous swelling noted along the ulnar side of the mid forearm.  Vascular shunt noted.  No fracture is observed.  IMPRESSION:  1.  Dialysis graft/shunt in  the forearm. 2.  Soft tissue swelling medially. 3.  No fracture or acute bony findings are observed.  Original Report Authenticated By: Dellia Cloud, M.D.   Ct Head Wo Contrast  12/19/2011  *RADIOLOGY REPORT*  Clinical Data:  Fall, generalized weakness  CT HEAD WITHOUT CONTRAST CT CERVICAL SPINE WITHOUT CONTRAST  Technique:  Multidetector CT imaging of the head and cervical spine was performed following the standard protocol without intravenous contrast.  Multiplanar CT image reconstructions of the cervical spine were also generated.  Comparison:  None.  CT HEAD  Findings: No evidence of parenchymal hemorrhage or extra-axial fluid collection. No mass lesion, mass effect, or midline shift.  No CT evidence of acute infarction.  Cortical and central atrophy with secondary ventricular prominence.  The visualized paranasal sinuses are essentially clear. The mastoid air cells are unopacified.  No evidence of calvarial fracture.  IMPRESSION: No evidence of acute intracranial abnormality.  Atrophy with secondary ventricular prominence.  CT CERVICAL SPINE  Findings: Normal cervical lordosis.  Evidence of fracture or dislocation.  Vertebral body heights are maintained.  The dens appears intact.  No prevertebral soft tissue swelling.  Mild multilevel degenerative changes.  Visualized thyroid is unremarkable.  Visualized lung apices are clear.  Small bilateral cervical, supraclavicular, and upper mediastinal lymph nodes which do not meet pathologic CT size criteria.  IMPRESSION: No evidence of traumatic injury to the cervical spine.  Mild multilevel degenerative changes.  Original Report Authenticated By: Charline Bills, M.D.   Ct Cervical Spine Wo Contrast  12/19/2011  *RADIOLOGY REPORT*  Clinical Data:  Fall, generalized weakness  CT HEAD WITHOUT CONTRAST CT CERVICAL SPINE WITHOUT CONTRAST  Technique:  Multidetector CT imaging of the head and cervical spine was performed following the standard protocol without  intravenous contrast.  Multiplanar CT image reconstructions of the cervical spine were also generated.  Comparison:  None.  CT HEAD  Findings: No evidence of parenchymal hemorrhage or extra-axial fluid collection. No mass lesion, mass effect, or midline shift.  No CT evidence of acute infarction.  Cortical and central atrophy with secondary ventricular prominence.  The visualized paranasal sinuses are essentially clear. The mastoid air cells are unopacified.  No evidence of calvarial fracture.  IMPRESSION: No evidence of acute intracranial abnormality.  Atrophy with secondary ventricular prominence.  CT CERVICAL SPINE  Findings: Normal cervical lordosis.  Evidence of fracture or dislocation.  Vertebral body heights are maintained.  The dens appears intact.  No prevertebral soft tissue swelling.  Mild multilevel degenerative changes.  Visualized thyroid is unremarkable.  Visualized lung apices are clear.  Small bilateral cervical, supraclavicular, and upper mediastinal lymph nodes which do not meet pathologic CT size criteria.  IMPRESSION: No  evidence of traumatic injury to the cervical spine.  Mild multilevel degenerative changes.  Original Report Authenticated By: Charline Bills, M.D.   US Abdomen Complete  12/20/2011  *RADIOLOGY REPORT*  Clinical Data:  Abdominal pain, status post fall.  End-stage renal disease, hepatitis C, alcohol use, dialysis.  ABDOMINAL ULTRASOUND COMPLETE  Comparison:  Abdominal ultrasound 07/14/2011  Findings:  Gallbladder:  No gallstones, gallbladder wall thickening, or pericholecystic fluid. Negative sonographic Murphy's sign.  Common Bile Duct:  Within normal limits in caliber. Measures 4 mm.  Liver: No focal mass lesion identified.  Within normal limits in parenchymal echogenicity. Liver contour appears smooth by ultrasound.  Portal vein is patent and demonstrates patent pedal flow.  IVC:  Appears normal.  Pancreas:  Although the pancreas is difficult to visualize in its entirety,  no focal pancreatic abnormality is identified.  Spleen:  Within normal limits in size and echotexture. Measures 9.0 cm in length.  Right kidney:  Small (7 cm) and markedly echogenic, consistent with end-stage renal disease.  Negative for hydronephrosis.  No mass visible by ultrasound.  Left kidney:  A small (7 cm) a markedly echogenic, consistent with end-stage renal disease.  Negative for hydronephrosis.  No mass visualized.  Abdominal Aorta:  No aneurysm identified.Maximal AP diameter is 2.5 cm.  The right pleural effusion is visualized.  IMPRESSION:  1.  No acute findings identified in the abdomen. 2.  Right pleural effusion. 3.  Small and echogenic kidneys, consistent with end-stage renal disease.  Original Report Authenticated By: Britta Mccreedy, M.D.   Medications: I have reviewed the patient's current medications. Scheduled Meds:   . albuterol  5 mg Nebulization STAT  . calcium acetate  667 mg Oral TID WC  . cloNIDine  0.3 mg Oral BID  . darbepoetin (ARANESP) injection - DIALYSIS  12.5 mcg Intravenous Q Mon-HD  . dextrose  50 mL Intravenous Once  . dextrose  50 mL Intravenous STAT  . dextrose      . folic acid  1 mg Oral Daily  . heparin  2,000 Units Dialysis Once in dialysis  . heparin  5,000 Units Subcutaneous Q8H  . insulin regular  10 Units Intravenous STAT  . lanthanum  1,000 mg Oral TID WC  . latanoprost  1 drop Both Eyes Daily  . lipase/protease/amylase  1 capsule Oral TID AC  . LORazepam  2 mg Intravenous Q6H  . multivitamin  1 tablet Oral QHS  . pantoprazole  40 mg Oral Daily  . paricalcitol  1 mcg Intravenous 3 times weekly  . pregabalin  75 mg Oral TID  . sodium chloride  3 mL Intravenous Q12H  . sodium polystyrene  30 g Oral Once  . thiamine  100 mg Oral Daily  . tiotropium  18 mcg Inhalation Daily  . DISCONTD: calcium acetate  667 mg Oral TID WC  . DISCONTD: calcium gluconate  1 g Intravenous STAT  . DISCONTD: calcium-vitamin D  1 tablet Oral TID AC & HS  . DISCONTD:  lanthanum  1,000 mg Oral TID WC   Continuous Infusions:   . DISCONTD: dextrose 5 % and 0.9% NaCl 150 mL/hr at 12/19/11 2227  . DISCONTD: dextrose 5 % and 0.9% NaCl 150 mL/hr at 12/20/11 0755  . DISCONTD:  sodium bicarbonate infusion 1000 mL     PRN Meds:.sodium chloride, sodium chloride, sodium chloride, albuterol, alteplase, feeding supplement (NEPRO CARB STEADY), heparin, HYDROmorphone (DILAUDID) injection, lidocaine, lidocaine-prilocaine, ondansetron, pentafluoroprop-tetrafluoroeth, sodium chloride Assessment/Plan:  56 year old man with past medical history  of end-stage renal disease on dialysis, chronic pancreatitis secondary to alcoholism, recurrent hospital admissions for acute on chronic abdominal pain admitted with hyperkalemia and complaints of increased abdominal pain after a syncopal episode this afternoon.   #1 Syncope: unclear etiology, likely related to his alcohol drinking.  No focal neuro deficit and head CT negative which rule out neurological reasons. No CP,CE positive but Troponin negative X3, EKG unremarkable for acute cardiac events Unable to obtain Orthostatic VS due to his uncooperative from alcohol withdrawal and urgent treatment of HD today  -check orthostatic bp in am -monitor with telemetry    #2 Hyperkalemia in setting of ESRD with metabolic acidosis: This is likely secondary to his ESRD and uremia.  Patient missed one session of HD. His Delta-Delta is 2 indicating mixed metabolic acidosis( renal failure, alcoholic ketoacidosis)and metabolic alkalosis (diuresis from alcohol)  -  Patient is better after HD, repeated BMP and ABG much improved. - Cycle cardiac enzymes >>>negative for ischemic event - hold Fosinopril which can cause hyperkalemia  - renal for HD  #3 Acute on chronic abdominal pain- Likely multifactorial given his presentation with heavy alcohol use, h/o acute on chronic pancreatitis and duodenitis. Given s/p fall and increased pain acute trauma is  a concern.  - CL as tol - Dilaudid 1 mg every 4 hours when necessary for pain, Zofran for nausea  - Gentle IV fluids in setting of renal failure  - Hemoccult negative for GI bleeding.  - Continue home dose of Protonix  - abd U/S negative for acute findings  #4 Alcoholism with alcohol withdrawal- Patient admits to relapse and starts to drink again. He has visible hands shaking/tremors. However, he is noted to have worsening of shaking/tremors when around staff.   - step down unit  - Ativan and CIWa - monitor mental status closely>>>he has been very lethargic>>>caution use of Ativan. - Monitor electrolytes, continue thiamine and folic acid  - check UDS >>>postive for Benzo - consider SW consult to assess current needs    #5 H/O Peptic ulcer disease - Hemoccult negative. Most recent EGD with duodenitis w/o ulcers, followed by Dr. Christella Hartigan of [facility], last evaluated ~ 2 weeks ago recommended colonoscopy for his intermittent rectal bleeding in setting of NSAID use with recommendation for colonoscopy as outpt.  -cont Protonix  -follow hemoglobin   #6 hepatitis C- stable liver enzymes  - abdominal ultrasound >>> no acute findings, right pleural effusion.>>HD will remove fluid  #7 Anemia of chronic disease-secondary to ESRD, hemoglobin of 9.6 (baseline 10-11), no evidence of GI bleed and neg FOBT  -continue to monitor hemoglobin  -Aranesp per Renal Service   #8 End-stage renal disease- MWF   He missed his HD on 5/15 due to drinking.   -he is in hemodialysis now. .   #9 COPD- no respiratory distress.  -Continue albuterol when necessary and Spiriva   # Hypertension:  -hold fosinopril in setting of hyperkalemia  -cont home regimen of Clonidine   #VTE heparin subcutaneous   # CODE STATUS-full code      LOS: 1 day   Teri Diltz 12/20/2011, 8:01 PM

## 2011-12-20 NOTE — Procedures (Signed)
I was present at this dialysis session. I have reviewed the session itself and made appropriate changes.   Rob Herschell Virani, MD [facility] 12/20/2011, 12:58 PM   

## 2011-12-20 NOTE — Progress Notes (Signed)
1615: I was notified by another nurse (Meridith, RN) that she found pt on the floor on his knees and hands. Pt was helped back in bed. Pt stated that he does not remember hitting his head. Pt complains of no pain except a slight headache. MD is notified. CT of head was discussed with MD, but not ordered at this time. Will follow up with MD.   1730: with sitter at bedside, pt managed to pull his 2 IV out. Will continue to monitor pt.

## 2011-12-20 NOTE — Progress Notes (Signed)
No need for head CT per MD. Will continue to monitor pt

## 2011-12-20 NOTE — Progress Notes (Signed)
Pt has been notified of pt's progressively increasing BP. Clonidine given as order.  Report given incoming nurse.

## 2011-12-21 ENCOUNTER — Encounter (HOSPITAL_COMMUNITY): Payer: Self-pay | Admitting: *Deleted

## 2011-12-21 ENCOUNTER — Inpatient Hospital Stay (HOSPITAL_COMMUNITY): Payer: Medicare Other

## 2011-12-21 LAB — RENAL FUNCTION PANEL
Albumin: 3.1 g/dL — ABNORMAL LOW (ref 3.5–5.2)
BUN: 26 mg/dL — ABNORMAL HIGH (ref 6–23)
CO2: 25 mEq/L (ref 19–32)
Calcium: 8.8 mg/dL (ref 8.4–10.5)
Chloride: 94 mEq/L — ABNORMAL LOW (ref 96–112)
Creatinine, Ser: 6.81 mg/dL — ABNORMAL HIGH (ref 0.50–1.35)
GFR calc Af Amer: 9 mL/min — ABNORMAL LOW (ref >90)
GFR calc non Af Amer: 8 mL/min — ABNORMAL LOW (ref >90)
Glucose, Bld: 95 mg/dL (ref 70–99)
Phosphorus: 9.9 mg/dL — ABNORMAL HIGH (ref 2.3–4.6)
Potassium: 6.2 mEq/L — ABNORMAL HIGH (ref 3.5–5.1)
Sodium: 136 mEq/L (ref 135–145)

## 2011-12-21 LAB — CBC
HCT: 33.9 % — ABNORMAL LOW (ref 39.0–52.0)
Hemoglobin: 10.8 g/dL — ABNORMAL LOW (ref 13.0–17.0)
MCH: 31.9 pg (ref 26.0–34.0)
MCHC: 31.9 g/dL (ref 30.0–36.0)
MCV: 100 fL (ref 78.0–100.0)
Platelets: 109 10*3/uL — ABNORMAL LOW (ref 150–400)
RBC: 3.39 MIL/uL — ABNORMAL LOW (ref 4.22–5.81)
RDW: 18.5 % — ABNORMAL HIGH (ref 11.5–15.5)
WBC: 4.3 10*3/uL (ref 4.0–10.5)

## 2011-12-21 LAB — GLUCOSE, CAPILLARY
Glucose-Capillary: 105 mg/dL — ABNORMAL HIGH (ref 70–99)
Glucose-Capillary: 102 mg/dL — ABNORMAL HIGH (ref 70–99)
Glucose-Capillary: 90 mg/dL (ref 70–99)
Glucose-Capillary: 122 mg/dL — ABNORMAL HIGH (ref 70–99)
Glucose-Capillary: 92 mg/dL (ref 70–99)

## 2011-12-21 MED ORDER — NICOTINE 21 MG/24HR TD PT24
21.0000 mg | MEDICATED_PATCH | Freq: Every day | TRANSDERMAL | Status: DC
  Administered 2011-12-21 – 2011-12-23 (×3): 21 mg via TRANSDERMAL
  Filled 2011-12-21 (×3): qty 1

## 2011-12-21 MED ORDER — HEPARIN SODIUM (PORCINE) 1000 UNIT/ML DIALYSIS
2000.0000 [IU] | Freq: Once | INTRAMUSCULAR | Status: AC
  Administered 2011-12-21: 2000 [IU] via INTRAVENOUS_CENTRAL
  Filled 2011-12-21: qty 2

## 2011-12-21 MED ORDER — LORAZEPAM 2 MG/ML IJ SOLN
0.5000 mg | INTRAMUSCULAR | Status: DC | PRN
  Administered 2011-12-22 – 2011-12-23 (×3): 1 mg via INTRAVENOUS
  Filled 2011-12-21 (×2): qty 1

## 2011-12-21 NOTE — Progress Notes (Signed)
Subjective:  Main complaint is "my pancreas is killing me". Large UF with HD yest, weight down 2 kg over dry wt.   Objective:    Vital signs in last 24 hours: Filed Vitals:   12/21/11 0000 12/21/11 0500 12/21/11 0737 12/21/11 0750  BP: 176/102 140/67  161/90  Pulse: 96 96  77  Temp: 98.1 F (36.7 C) 97.7 F (36.5 C)  98.1 F (36.7 C)  TempSrc: Oral Oral  Oral  Resp: 15 15  17   Height:      Weight: 72.4 kg (159 lb 9.8 oz)     SpO2: 95% 100% 97% 97%   Weight change: 0.8 kg (1 lb 12.2 oz)  Intake/Output Summary (Last 24 hours) at 12/21/11 0853 Last data filed at 12/20/11 1306  Gross per 24 hour  Intake      0 ml  Output   4982 ml  Net  -4982 ml   Labs: Basic Metabolic Panel:  Lab 12/21/11 1610 12/20/11 1638 12/20/11 0655 12/19/11 2018 12/19/11 1739  NA 136 138 129* 135 132*  K 6.2* 4.1 7.9* 5.4* 7.3*  CL 94* 94* 92* 93* 91*  CO2 25 26 15* 20 17*  GLUCOSE 95 70 91 23* 80  BUN 26* 20 62* 60* 58*  CREATININE 6.81* 5.69* 11.46* 11.36* 11.33*  ALB -- -- -- -- --  CALCIUM 8.8 9.3 8.4 8.8 8.4  PHOS 9.9* -- -- -- --   Liver Function Tests:  Lab 12/21/11 0415 12/19/11 2018 12/19/11 1739  AST -- 32 36  ALT -- 20 22  ALKPHOS -- 104 113  BILITOT -- 0.2* 0.2*  PROT -- 7.2 7.3  ALBUMIN 3.1* 3.1* 3.1*    Lab 12/19/11 1739  LIPASE 15  AMYLASE --   No results found for this basename: AMMONIA:3 in the last 168 hours CBC:  Lab 12/21/11 0415 12/20/11 0655 12/19/11 1739  WBC 4.3 9.2 9.1  NEUTROABS -- -- 7.4  HGB 10.8* 10.9* 9.6*  HCT 33.9* 34.3* 30.0*  MCV 100.0 101.2* 99.0  PLT 109* 99* 109*   Cardiac Enzymes:  Lab 12/20/11 1638 12/20/11 0655 12/19/11 2333  CKTOTAL 242* 291* 318*  CKMB 7.0* 7.4* 7.2*  CKMBINDEX -- -- --  TROPONINI <0.30 <0.30 <0.30   CBG:  Lab 12/21/11 0120 12/20/11 2059 12/20/11 1625 12/20/11 1624 12/20/11 0832  GLUCAP 102* 105* 76 62* 99     Physical Exam:  Blood pressure 161/90, pulse 77, temperature 98.1 F (36.7 C), temperature source  Oral, resp. rate 17, height 6' (1.829 m), weight 72.4 kg (159 lb 9.8 oz), SpO2 97.00%.  General: less tremulous and more coherent Neck: Supple. JVD not elevated.  Chest: clear bilat  Heart: RRR with S1 S2. No rub or gallop. Abdomen: Soft, tender mid abdomen Lower extremities: without edema or ischemic changes  Neuro: Alert and oriented X 3. Skin: pale  Dialysis Access:left upper AVGG patent   Dialysis Orders: Center: [facility] on MWF  EDW 70 HD Bath 2K 2.25 Ca Time 4 Heparin 2000. Access left upper AVGG BFR 400 DFR 800  Zemplar 1 mcg IV/HD Epogen 4200 Units IV/HD Venofer 100/week  Other profile 4; MRSA + status    Assessment/Plan 1. Abdominal pain - in the setting of etoh abuse with hx pancreatitis; lipase is normal; advance diet as tolerate; on enzymes and PPI. May benefit from chronic pain regimen.  2. HTN/volume - was 8kg over dry wt on admission, now looks euvolemic after HD yesterday. On clonidine 0.3 bid, fosinopril  10/hs. Just getting the clonidine here for now.  3. ESRD / MWF at [facility]- extra HD for K+ 4. Hyperkalemia- improved after HD but up again.  Will do extra HD today x 3 hrs. 5. ETOH abuse- chronic issue 6. Anemia - Hgb stable - continue ESA and IV Fe as outpt 7. MBD - continue binders and zemplar 1; d/c ca with vit D; give phoslo and fosrenol only. Phos 9.9 8. Nutrition - advance diet as able. 9. MRSA + outpt; PCR neg this admission   Vinson Moselle  MD [facility] [phone] pgr    [phone] cell 12/21/2011, 8:53 AM

## 2011-12-21 NOTE — Progress Notes (Signed)
Subjective:  Patient still in pain. Has severe abdominal pain- not able to tolerate diet. Did not have any nausea vomiting for past 3 days. Has no chest pain or short of breath.  Objective: Vital signs in last 24 hours: Filed Vitals:   12/21/11 0000 12/21/11 0500 12/21/11 0737 12/21/11 0750  BP: 176/102 140/67  161/90  Pulse: 96 96  77  Temp: 98.1 F (36.7 C) 97.7 F (36.5 C)  98.1 F (36.7 C)  TempSrc: Oral Oral  Oral  Resp: 15 15  17   Height:      Weight: 159 lb 9.8 oz (72.4 kg)     SpO2: 95% 100% 97% 97%   Weight change: 1 lb 12.2 oz (0.8 kg)  Intake/Output Summary (Last 24 hours) at 12/21/11 1038 Last data filed at 12/20/11 1306  Gross per 24 hour  Intake      0 ml  Output   4982 ml  Net  -4982 ml   General: mild distress due to acute illness. Eyes: PERRLA. Neck: supple, no masses, no carotid bruits appreciated, no JVD Lungs: CTA B/L Heart: RRR, no M/G/R GU/Abdomen: BS normoactive. Soft, tenderness  to palpation RUQ.  No masses or organomegaly appreciated Extremities: No pretibial edema, distal pedal pulses intact bilaterally, multiple excoriations and ecchymosis on all extremities most notable bilateral hands, left forearm and left shin Neurologic: lethargic, easily arousal and follow simple commands, grossly non-focal, visible hands and arms shakes noted.   Lab Results: Basic Metabolic Panel:  Lab 12/21/11 1610 12/20/11 1638  NA 136 138  K 6.2* 4.1  CL 94* 94*  CO2 25 26  GLUCOSE 95 70  BUN 26* 20  CREATININE 6.81* 5.69*  CALCIUM 8.8 9.3  MG -- --  PHOS 9.9* --   Liver Function Tests:  Lab 12/21/11 0415 12/19/11 2018 12/19/11 1739  AST -- 32 36  ALT -- 20 22  ALKPHOS -- 104 113  BILITOT -- 0.2* 0.2*  PROT -- 7.2 7.3  ALBUMIN 3.1* 3.1* --    Lab 12/19/11 1739  LIPASE 15  AMYLASE --   CBC:  Lab 12/21/11 0415 12/20/11 0655 12/19/11 1739  WBC 4.3 9.2 --  NEUTROABS -- -- 7.4  HGB 10.8* 10.9* --  HCT 33.9* 34.3* --  MCV 100.0 101.2* --    PLT 109* 99* --   Cardiac Enzymes:  Lab 12/20/11 1638 12/20/11 0655 12/19/11 2333  CKTOTAL 242* 291* 318*  CKMB 7.0* 7.4* 7.2*  CKMBINDEX -- -- --  TROPONINI <0.30 <0.30 <0.30   CBG:  Lab 12/21/11 0754 12/21/11 0120 12/20/11 2059 12/20/11 1625 12/20/11 1624 12/20/11 0832  GLUCAP 90 102* 105* 76 62* 99   Coagulation:  Lab 12/19/11 2334  LABPROT 14.3  INR 1.09   Urine Drug Screen: Drugs of Abuse     Component Value Date/Time   LABOPIA NONE DETECTED 12/20/2011 0026   COCAINSCRNUR NONE DETECTED 12/20/2011 0026   COCAINSCRNUR NEG 09/27/2010 1551   LABBENZ POSITIVE* 12/20/2011 0026   LABBENZ NEG 09/27/2010 1551   AMPHETMU NONE DETECTED 12/20/2011 0026   AMPHETMU NEG 09/27/2010 1551   THCU NONE DETECTED 12/20/2011 0026   LABBARB NONE DETECTED 12/20/2011 0026    Alcohol Level:  Lab 12/19/11 1739  ETH 25*   Micro Results: Recent Results (from the past 240 hour(s))  MRSA PCR SCREENING     Status: Normal   Collection Time   12/19/11 11:03 PM      Component Value Range Status Comment   MRSA by  PCR NEGATIVE  NEGATIVE  Final   CULTURE, BLOOD (ROUTINE X 2)     Status: Normal (Preliminary result)   Collection Time   12/20/11  9:00 AM      Component Value Range Status Comment   Specimen Description BLOOD HEMODIALYSIS GRAFT   Final    Special Requests BOTTLES DRAWN AEROBIC AND ANAEROBIC 10CC   Final    Culture  Setup Time [id number]   Final    Culture     Final    Value:        BLOOD CULTURE RECEIVED NO GROWTH TO DATE CULTURE WILL BE HELD FOR 5 DAYS BEFORE ISSUING A FINAL NEGATIVE REPORT   Report Status PENDING   Incomplete   CULTURE, BLOOD (ROUTINE X 2)     Status: Normal (Preliminary result)   Collection Time   12/20/11  9:15 AM      Component Value Range Status Comment   Specimen Description BLOOD HEMODIALYSIS GRAFT   Final    Special Requests BOTTLES DRAWN AEROBIC AND ANAEROBIC 10CC   Final    Culture  Setup Time [id number]   Final    Culture     Final    Value:         BLOOD CULTURE RECEIVED NO GROWTH TO DATE CULTURE WILL BE HELD FOR 5 DAYS BEFORE ISSUING A FINAL NEGATIVE REPORT   Report Status PENDING   Incomplete    Studies/Results: Dg Chest 1 View  12/19/2011  *RADIOLOGY REPORT*  Clinical Data: Syncope.  Fall.  Pain.  CHEST - 1 VIEW  Comparison: 10/24/2011  Findings: Cardiomegaly with cephalization of blood flow noted, compatible with pulmonary venous hypertension.  No pneumothorax is observed.  Indistinct density along the lingula probably represents the scarring shown on prior chest CT.  No pneumothorax or pulmonary contusion observed.  IMPRESSION:  1.  Cardiomegaly and pulmonary venous hypertension. 2.  Mild lingular scarring.  Original Report Authenticated By: Dellia Cloud, M.D.   Dg Forearm Left  12/19/2011  *RADIOLOGY REPORT*  Clinical Data: Syncope.  Fall.  Left forearm pain and bruising distally.  LEFT FOREARM - 2 VIEW  Comparison: None.  Findings: Vascular clips and stent projects in the vicinity of the elbow.  Subcutaneous swelling noted along the ulnar side of the mid forearm.  Vascular shunt noted.  No fracture is observed.  IMPRESSION:  1.  Dialysis graft/shunt in the forearm. 2.  Soft tissue swelling medially. 3.  No fracture or acute bony findings are observed.  Original Report Authenticated By: Dellia Cloud, M.D.   Ct Head Wo Contrast  12/19/2011  *RADIOLOGY REPORT*  Clinical Data:  Fall, generalized weakness  CT HEAD WITHOUT CONTRAST CT CERVICAL SPINE WITHOUT CONTRAST  Technique:  Multidetector CT imaging of the head and cervical spine was performed following the standard protocol without intravenous contrast.  Multiplanar CT image reconstructions of the cervical spine were also generated.  Comparison:  None.  CT HEAD  Findings: No evidence of parenchymal hemorrhage or extra-axial fluid collection. No mass lesion, mass effect, or midline shift.  No CT evidence of acute infarction.  Cortical and central atrophy with secondary  ventricular prominence.  The visualized paranasal sinuses are essentially clear. The mastoid air cells are unopacified.  No evidence of calvarial fracture.  IMPRESSION: No evidence of acute intracranial abnormality.  Atrophy with secondary ventricular prominence.  CT CERVICAL SPINE  Findings: Normal cervical lordosis.  Evidence of fracture or dislocation.  Vertebral body heights are maintained.  The dens  appears intact.  No prevertebral soft tissue swelling.  Mild multilevel degenerative changes.  Visualized thyroid is unremarkable.  Visualized lung apices are clear.  Small bilateral cervical, supraclavicular, and upper mediastinal lymph nodes which do not meet pathologic CT size criteria.  IMPRESSION: No evidence of traumatic injury to the cervical spine.  Mild multilevel degenerative changes.  Original Report Authenticated By: Charline Bills, M.D.   Ct Cervical Spine Wo Contrast  12/19/2011  *RADIOLOGY REPORT*  Clinical Data:  Fall, generalized weakness  CT HEAD WITHOUT CONTRAST CT CERVICAL SPINE WITHOUT CONTRAST  Technique:  Multidetector CT imaging of the head and cervical spine was performed following the standard protocol without intravenous contrast.  Multiplanar CT image reconstructions of the cervical spine were also generated.  Comparison:  None.  CT HEAD  Findings: No evidence of parenchymal hemorrhage or extra-axial fluid collection. No mass lesion, mass effect, or midline shift.  No CT evidence of acute infarction.  Cortical and central atrophy with secondary ventricular prominence.  The visualized paranasal sinuses are essentially clear. The mastoid air cells are unopacified.  No evidence of calvarial fracture.  IMPRESSION: No evidence of acute intracranial abnormality.  Atrophy with secondary ventricular prominence.  CT CERVICAL SPINE  Findings: Normal cervical lordosis.  Evidence of fracture or dislocation.  Vertebral body heights are maintained.  The dens appears intact.  No prevertebral soft  tissue swelling.  Mild multilevel degenerative changes.  Visualized thyroid is unremarkable.  Visualized lung apices are clear.  Small bilateral cervical, supraclavicular, and upper mediastinal lymph nodes which do not meet pathologic CT size criteria.  IMPRESSION: No evidence of traumatic injury to the cervical spine.  Mild multilevel degenerative changes.  Original Report Authenticated By: Charline Bills, M.D.   US Abdomen Complete  12/20/2011  *RADIOLOGY REPORT*  Clinical Data:  Abdominal pain, status post fall.  End-stage renal disease, hepatitis C, alcohol use, dialysis.  ABDOMINAL ULTRASOUND COMPLETE  Comparison:  Abdominal ultrasound 07/14/2011  Findings:  Gallbladder:  No gallstones, gallbladder wall thickening, or pericholecystic fluid. Negative sonographic Murphy's sign.  Common Bile Duct:  Within normal limits in caliber. Measures 4 mm.  Liver: No focal mass lesion identified.  Within normal limits in parenchymal echogenicity. Liver contour appears smooth by ultrasound.  Portal vein is patent and demonstrates patent pedal flow.  IVC:  Appears normal.  Pancreas:  Although the pancreas is difficult to visualize in its entirety, no focal pancreatic abnormality is identified.  Spleen:  Within normal limits in size and echotexture. Measures 9.0 cm in length.  Right kidney:  Small (7 cm) and markedly echogenic, consistent with end-stage renal disease.  Negative for hydronephrosis.  No mass visible by ultrasound.  Left kidney:  A small (7 cm) a markedly echogenic, consistent with end-stage renal disease.  Negative for hydronephrosis.  No mass visualized.  Abdominal Aorta:  No aneurysm identified.Maximal AP diameter is 2.5 cm.  The right pleural effusion is visualized.  IMPRESSION:  1.  No acute findings identified in the abdomen. 2.  Right pleural effusion. 3.  Small and echogenic kidneys, consistent with end-stage renal disease.  Original Report Authenticated By: Britta Mccreedy, M.D.   Medications: I have  reviewed the patient's current medications. Scheduled Meds:    . calcium acetate  667 mg Oral TID WC  . cloNIDine  0.3 mg Oral BID  . darbepoetin (ARANESP) injection - DIALYSIS  12.5 mcg Intravenous Q Mon-HD  . dextrose  50 mL Intravenous STAT  . folic acid  1 mg Oral Daily  .  heparin  2,000 Units Dialysis Once in dialysis  . heparin  2,000 Units Dialysis Once in dialysis  . heparin  5,000 Units Subcutaneous Q8H  . insulin regular  10 Units Intravenous STAT  . lanthanum  1,000 mg Oral TID WC  . latanoprost  1 drop Both Eyes Daily  . lipase/protease/amylase  1 capsule Oral TID AC  . LORazepam  2 mg Intravenous Q6H  . multivitamin  1 tablet Oral QHS  . nicotine  21 mg Transdermal Daily  . pantoprazole  40 mg Oral Daily  . paricalcitol  1 mcg Intravenous 3 times weekly  . pregabalin  75 mg Oral TID  . sodium chloride  3 mL Intravenous Q12H  . sodium polystyrene  30 g Oral Once  . thiamine  100 mg Oral Daily  . tiotropium  18 mcg Inhalation Daily  . DISCONTD: calcium acetate  667 mg Oral TID WC  . DISCONTD: calcium gluconate  1 g Intravenous STAT  . DISCONTD: calcium-vitamin D  1 tablet Oral TID AC & HS  . DISCONTD: lanthanum  1,000 mg Oral TID WC   Continuous Infusions:  PRN Meds:.sodium chloride, sodium chloride, sodium chloride, albuterol, alteplase, feeding supplement (NEPRO CARB STEADY), heparin, HYDROmorphone (DILAUDID) injection, lidocaine, lidocaine-prilocaine, LORazepam, ondansetron, pentafluoroprop-tetrafluoroeth, sodium chloride Assessment/Plan:  56 year old man with past medical history of end-stage renal disease on dialysis, chronic pancreatitis secondary to alcoholism, recurrent hospital admissions for acute on chronic abdominal pain admitted with hyperkalemia and complaints of increased abdominal pain after a syncopal episode  #1 Syncope: unclear etiology, likely related to his alcohol drinking.  No focal neuro deficit and head CT negative which rule out neurological  reasons. No CP,CE positive but Troponin negative X3, EKG unremarkable for acute cardiac events Unable to obtain Orthostatic VS due to his uncooperative from alcohol withdrawal and urgent treatment of HD today  - BP labile- elevated this morning. Likely from severe pain. - monitor with telemetry    #2 Hyperkalemia in setting of ESRD with metabolic acidosis: This is likely secondary to his ESRD and uremia.  Patient missed one session of HD. His Delta-Delta is 2 indicating mixed metabolic acidosis( renal failure, alcoholic ketoacidosis)and metabolic alkalosis (diuresis from alcohol)  -  Patient is better after HD, repeated BMP and ABG much improved. - hold Fosinopril which can cause hyperkalemia  - renal for HD- HD today for hyperkalemia  #3 Acute on chronic abdominal pain- Likely multifactorial given his presentation with heavy alcohol use, h/o acute on chronic pancreatitis and duodenitis. Given s/p fall and increased pain acute trauma is a concern.  - CL as tol - Dilaudid 1 mg every 4 hours when necessary for pain, Zofran for nausea  - Gentle IV fluids in setting of renal failure  - Hemoccult negative for GI bleeding.  - Continue home dose of Protonix  - abd U/S negative for acute findings  #4 Alcoholism with alcohol withdrawal- Patient admits to relapse and starts to drink again. He has visible hands shaking/tremors. However, he is noted to have worsening of shaking/tremors when around staff.   - step down unit  - Ativan and CIWa - monitor mental status closely>>>he has been very lethargic>>>caution use of Ativan. - Monitor electrolytes, continue thiamine and folic acid  - check UDS >>>postive for Benzo - consider SW consult to assess current needs    #5 H/O Peptic ulcer disease - Hemoccult negative. Most recent EGD with duodenitis w/o ulcers, followed by Dr. Christella Hartigan of [facility], last evaluated ~ 2  weeks ago recommended colonoscopy for his intermittent rectal bleeding in setting  of NSAID use with recommendation for colonoscopy as outpt.  -cont Protonix  -follow hemoglobin   #6 hepatitis C- stable liver enzymes  - abdominal ultrasound >>> no acute findings, right pleural effusion.>>HD will remove fluid  #7 Anemia of chronic disease-secondary to ESRD, hemoglobin of 9.6 (baseline 10-11), no evidence of GI bleed and neg FOBT  -continue to monitor hemoglobin  -Aranesp per Renal Service   #8 End-stage renal disease- MWF   He missed his HD on 5/15 due to drinking.  - HD again today.  #9 COPD- no respiratory distress.  -Continue albuterol when necessary and Spiriva   # Hypertension:  -hold fosinopril in setting of hyperkalemia  -cont home regimen of Clonidine   #VTE heparin subcutaneous   # CODE STATUS-full code      LOS: 2 days   Deneen Slager 12/21/2011, 10:38 AM

## 2011-12-21 NOTE — Procedures (Signed)
I was present at this dialysis session. I have reviewed the session itself and made appropriate changes.   Vinson Moselle, MD [employer] 12/21/2011, 4:06 PM

## 2011-12-22 LAB — RENAL FUNCTION PANEL
Albumin: 3.1 g/dL — ABNORMAL LOW (ref 3.5–5.2)
Chloride: 95 mEq/L — ABNORMAL LOW (ref 96–112)
GFR calc non Af Amer: 11 mL/min — ABNORMAL LOW (ref >90)
Glucose, Bld: 153 mg/dL — ABNORMAL HIGH (ref 70–99)
Phosphorus: 4.4 mg/dL (ref 2.3–4.6)
Potassium: 3.7 mEq/L (ref 3.5–5.1)
Sodium: 135 mEq/L (ref 135–145)

## 2011-12-22 LAB — GLUCOSE, CAPILLARY
Glucose-Capillary: 101 mg/dL — ABNORMAL HIGH (ref 70–99)
Glucose-Capillary: 133 mg/dL — ABNORMAL HIGH (ref 70–99)

## 2011-12-22 LAB — CBC
Platelets: 118 10*3/uL — ABNORMAL LOW (ref 150–400)
RBC: 3.66 MIL/uL — ABNORMAL LOW (ref 4.22–5.81)
RDW: 17.9 % — ABNORMAL HIGH (ref 11.5–15.5)
WBC: 3 10*3/uL — ABNORMAL LOW (ref 4.0–10.5)

## 2011-12-22 MED ORDER — PREGABALIN 75 MG PO CAPS
75.0000 mg | ORAL_CAPSULE | Freq: Every day | ORAL | Status: DC
  Administered 2011-12-23: 75 mg via ORAL
  Filled 2011-12-22: qty 1

## 2011-12-22 MED ORDER — HEPARIN SODIUM (PORCINE) 1000 UNIT/ML DIALYSIS
2000.0000 [IU] | INTRAMUSCULAR | Status: DC | PRN
  Filled 2011-12-22: qty 2

## 2011-12-22 NOTE — Progress Notes (Signed)
Subjective:  Abdominal pain improved. Minimal pain now, nausea or vomiting for past 3-4 days.He feels that he is stable to go home. Has no chest pain or short of breath.  Objective: Vital signs in last 24 hours: Filed Vitals:   12/22/11 0804 12/22/11 0952 12/22/11 0955 12/22/11 0956  BP:  153/89 138/77 133/77  Pulse:  90 79 79  Temp:      TempSrc:      Resp:  19 11 11   Height:      Weight:      SpO2: 97% 93% 90% 92%   Weight change: -13 lb 14.2 oz (-6.3 kg)  Intake/Output Summary (Last 24 hours) at 12/22/11 1056 Last data filed at 12/22/11 1000  Gross per 24 hour  Intake    360 ml  Output   2794 ml  Net  -2434 ml   General: mild distress due to acute illness. Eyes: PERRLA. Neck: supple, no masses, no carotid bruits appreciated, no JVD Lungs: CTA B/L Heart: RRR, no M/G/R GU/Abdomen: BS normoactive. Soft, tenderness  to palpation RUQ.  No masses or organomegaly appreciated Extremities: No pretibial edema, distal pedal pulses intact bilaterally, multiple excoriations and ecchymosis on all extremities most notable bilateral hands, left forearm and left shin Neurologic: lethargic, easily arousal and follow simple commands, grossly non-focal, visible hands and arms shakes noted.   Lab Results: Basic Metabolic Panel:  Lab 12/22/11 1610 12/21/11 0415  NA 135 136  K 3.7 6.2*  CL 95* 94*  CO2 27 25  GLUCOSE 153* 95  BUN 14 26*  CREATININE 5.11* 6.81*  CALCIUM 9.4 8.8  MG -- --  PHOS 4.4 9.9*   Liver Function Tests:  Lab 12/22/11 0425 12/21/11 0415 12/19/11 2018 12/19/11 1739  AST -- -- 32 36  ALT -- -- 20 22  ALKPHOS -- -- 104 113  BILITOT -- -- 0.2* 0.2*  PROT -- -- 7.2 7.3  ALBUMIN 3.1* 3.1* -- --    Lab 12/19/11 1739  LIPASE 15  AMYLASE --   CBC:  Lab 12/22/11 0425 12/21/11 0415 12/19/11 1739  WBC 3.0* 4.3 --  NEUTROABS -- -- 7.4  HGB 11.5* 10.8* --  HCT 35.6* 33.9* --  MCV 97.3 100.0 --  PLT 118* 109* --   Cardiac Enzymes:  Lab 12/20/11 1638  12/20/11 0655 12/19/11 2333  CKTOTAL 242* 291* 318*  CKMB 7.0* 7.4* 7.2*  CKMBINDEX -- -- --  TROPONINI <0.30 <0.30 <0.30   CBG:  Lab 12/21/11 2016 12/21/11 1157 12/21/11 0754 12/21/11 0120 12/20/11 2059 12/20/11 1625  GLUCAP 92 122* 90 102* 105* 76   Coagulation:  Lab 12/19/11 2334  LABPROT 14.3  INR 1.09   Urine Drug Screen: Drugs of Abuse     Component Value Date/Time   LABOPIA NONE DETECTED 12/20/2011 0026   COCAINSCRNUR NONE DETECTED 12/20/2011 0026   COCAINSCRNUR NEG 09/27/2010 1551   LABBENZ POSITIVE* 12/20/2011 0026   LABBENZ NEG 09/27/2010 1551   AMPHETMU NONE DETECTED 12/20/2011 0026   AMPHETMU NEG 09/27/2010 1551   THCU NONE DETECTED 12/20/2011 0026   LABBARB NONE DETECTED 12/20/2011 0026    Alcohol Level:  Lab 12/19/11 1739  ETH 25*   Micro Results: Recent Results (from the past 240 hour(s))  MRSA PCR SCREENING     Status: Normal   Collection Time   12/19/11 11:03 PM      Component Value Range Status Comment   MRSA by PCR NEGATIVE  NEGATIVE  Final   CULTURE, BLOOD (ROUTINE  X 2)     Status: Normal (Preliminary result)   Collection Time   12/20/11  9:00 AM      Component Value Range Status Comment   Specimen Description BLOOD HEMODIALYSIS GRAFT   Final    Special Requests BOTTLES DRAWN AEROBIC AND ANAEROBIC 10CC   Final    Culture  Setup Time [id number]   Final    Culture     Final    Value:        BLOOD CULTURE RECEIVED NO GROWTH TO DATE CULTURE WILL BE HELD FOR 5 DAYS BEFORE ISSUING A FINAL NEGATIVE REPORT   Report Status PENDING   Incomplete   CULTURE, BLOOD (ROUTINE X 2)     Status: Normal (Preliminary result)   Collection Time   12/20/11  9:15 AM      Component Value Range Status Comment   Specimen Description BLOOD HEMODIALYSIS GRAFT   Final    Special Requests BOTTLES DRAWN AEROBIC AND ANAEROBIC 10CC   Final    Culture  Setup Time [id number]   Final    Culture     Final    Value:        BLOOD CULTURE RECEIVED NO GROWTH TO DATE CULTURE WILL BE HELD  FOR 5 DAYS BEFORE ISSUING A FINAL NEGATIVE REPORT   Report Status PENDING   Incomplete    Studies/Results: US Abdomen Complete  12/20/2011  *RADIOLOGY REPORT*  Clinical Data:  Abdominal pain, status post fall.  End-stage renal disease, hepatitis C, alcohol use, dialysis.  ABDOMINAL ULTRASOUND COMPLETE  Comparison:  Abdominal ultrasound 07/14/2011  Findings:  Gallbladder:  No gallstones, gallbladder wall thickening, or pericholecystic fluid. Negative sonographic Murphy's sign.  Common Bile Duct:  Within normal limits in caliber. Measures 4 mm.  Liver: No focal mass lesion identified.  Within normal limits in parenchymal echogenicity. Liver contour appears smooth by ultrasound.  Portal vein is patent and demonstrates patent pedal flow.  IVC:  Appears normal.  Pancreas:  Although the pancreas is difficult to visualize in its entirety, no focal pancreatic abnormality is identified.  Spleen:  Within normal limits in size and echotexture. Measures 9.0 cm in length.  Right kidney:  Small (7 cm) and markedly echogenic, consistent with end-stage renal disease.  Negative for hydronephrosis.  No mass visible by ultrasound.  Left kidney:  A small (7 cm) a markedly echogenic, consistent with end-stage renal disease.  Negative for hydronephrosis.  No mass visualized.  Abdominal Aorta:  No aneurysm identified.Maximal AP diameter is 2.5 cm.  The right pleural effusion is visualized.  IMPRESSION:  1.  No acute findings identified in the abdomen. 2.  Right pleural effusion. 3.  Small and echogenic kidneys, consistent with end-stage renal disease.  Original Report Authenticated By: Britta Mccreedy, M.D.   Medications: I have reviewed the patient's current medications. Scheduled Meds:    . calcium acetate  667 mg Oral TID WC  . cloNIDine  0.3 mg Oral BID  . darbepoetin (ARANESP) injection - DIALYSIS  12.5 mcg Intravenous Q Mon-HD  . folic acid  1 mg Oral Daily  . heparin  2,000 Units Dialysis Once in dialysis  . heparin   5,000 Units Subcutaneous Q8H  . lanthanum  1,000 mg Oral TID WC  . latanoprost  1 drop Both Eyes Daily  . lipase/protease/amylase  1 capsule Oral TID AC  . LORazepam  2 mg Intravenous Q6H  . multivitamin  1 tablet Oral QHS  . nicotine  21 mg Transdermal Daily  .  pantoprazole  40 mg Oral Daily  . paricalcitol  1 mcg Intravenous 3 times weekly  . pregabalin  75 mg Oral TID  . sodium chloride  3 mL Intravenous Q12H  . sodium polystyrene  30 g Oral Once  . thiamine  100 mg Oral Daily  . tiotropium  18 mcg Inhalation Daily   Continuous Infusions:  PRN Meds:.sodium chloride, sodium chloride, sodium chloride, albuterol, feeding supplement (NEPRO CARB STEADY), heparin, HYDROmorphone (DILAUDID) injection, lidocaine, lidocaine-prilocaine, LORazepam, ondansetron, pentafluoroprop-tetrafluoroeth, sodium chloride Assessment/Plan:  56 year old man with past medical history of end-stage renal disease on dialysis, chronic pancreatitis secondary to alcoholism, recurrent hospital admissions for acute on chronic abdominal pain admitted with hyperkalemia and complaints of increased abdominal pain after a syncopal episode  #1 Syncope: unclear etiology, likely related to his alcohol drinking.  No focal neuro deficit and head CT negative which rule out neurological reasons. No CP,CE positive but Troponin negative X3, EKG unremarkable for acute cardiac events Unable to obtain Orthostatic VS due to his uncooperative from alcohol withdrawal and urgent treatment of HD today  - BP much stable. Orthostatics negative today. - monitor with telemetry - no apparent cardiac etiology noted. - Will get physical therapy evaluation for possible discharge today/tomorrow.   #2 Hyperkalemia in setting of ESRD with metabolic acidosis: This is likely secondary to his ESRD and uremia.  Patient missed one session of HD. His Delta-Delta is 2 indicating mixed metabolic acidosis( renal failure, alcoholic ketoacidosis)and metabolic  alkalosis (diuresis from alcohol)  -  Patient is better after HD, repeated BMP and ABG much improved. - hold Fosinopril which can cause hyperkalemia  - renal for HD  #3 Acute on chronic abdominal pain- Likely multifactorial given his presentation with heavy alcohol use, h/o acute on chronic pancreatitis and duodenitis. Given s/p fall and increased pain acute trauma is a concern.  - CL as tol - Dilaudid 1 mg every 4 hours when necessary for pain, Zofran for nausea  - Gentle IV fluids in setting of renal failure  - Hemoccult negative for GI bleeding.  - Continue home dose of Protonix  - abd U/S negative for acute findings  #4 Alcoholism with alcohol withdrawal- Patient admits to relapse and starts to drink again. He has visible hands shaking/tremors. However, he is noted to have worsening of shaking/tremors when around staff.   - Transfer to telemetry today - Ativan and CIWa - monitor mental status closely>>>he has been very lethargic>>>caution use of Ativan. - Monitor electrolytes, continue thiamine and folic acid  - check UDS >>>postive for Benzo - consider SW consult to assess current needs    #5 H/O Peptic ulcer disease - Hemoccult negative. Most recent EGD with duodenitis w/o ulcers, followed by Dr. Christella Hartigan of [facility], last evaluated ~ 2 weeks ago recommended colonoscopy for his intermittent rectal bleeding in setting of NSAID use with recommendation for colonoscopy as outpt.  -cont Protonix  -follow hemoglobin   #6 hepatitis C- stable liver enzymes  - abdominal ultrasound >>> no acute findings, right pleural effusion.>>HD will remove fluid  #7 Anemia of chronic disease-secondary to ESRD, hemoglobin of 9.6 (baseline 10-11), no evidence of GI bleed and neg FOBT  -continue to monitor hemoglobin  -Aranesp per Renal Service   #8 End-stage renal disease- MWF   He missed his HD on 5/15 due to drinking.  - HD again today.  #9 COPD- no respiratory distress.  -Continue albuterol  when necessary and Spiriva   # Hypertension:  -hold fosinopril in setting  of hyperkalemia  -cont home regimen of Clonidine   #VTE heparin subcutaneous   # CODE STATUS-full code      LOS: 3 days   Kent Riendeau 12/22/2011, 10:56 AM

## 2011-12-22 NOTE — Progress Notes (Signed)
Subjective:  Abd pain better.   Objective:    Vital signs in last 24 hours: Filed Vitals:   12/22/11 0956 12/22/11 1058 12/22/11 1100 12/22/11 1229  BP: 133/77 146/93 162/99 153/99  Pulse: 79  76 76  Temp:    97.9 F (36.6 C)  TempSrc:    Oral  Resp: 11  15 21   Height:      Weight:      SpO2: 92%  96% 97%   Weight change: -6.3 kg (-13 lb 14.2 oz)  Intake/Output Summary (Last 24 hours) at 12/22/11 1432 Last data filed at 12/22/11 1100  Gross per 24 hour  Intake    480 ml  Output   2844 ml  Net  -2364 ml   Labs: Basic Metabolic Panel:  Lab 12/22/11 1610 12/21/11 0415 12/20/11 1638 12/20/11 0655 12/19/11 2018 12/19/11 1739  NA 135 136 138 129* 135 132*  K 3.7 6.2* 4.1 7.9* 5.4* 7.3*  CL 95* 94* 94* 92* 93* 91*  CO2 27 25 26  15* 20 17*  GLUCOSE 153* 95 70 91 23* 80  BUN 14 26* 20 62* 60* 58*  CREATININE 5.11* 6.81* 5.69* 11.46* 11.36* 11.33*  ALB -- -- -- -- -- --  CALCIUM 9.4 8.8 9.3 8.4 8.8 8.4  PHOS 4.4 9.9* -- -- -- --   Liver Function Tests:  Lab 12/22/11 0425 12/21/11 0415 12/19/11 2018 12/19/11 1739  AST -- -- 32 36  ALT -- -- 20 22  ALKPHOS -- -- 104 113  BILITOT -- -- 0.2* 0.2*  PROT -- -- 7.2 7.3  ALBUMIN 3.1* 3.1* 3.1* --    Lab 12/19/11 1739  LIPASE 15  AMYLASE --   No results found for this basename: AMMONIA:3 in the last 168 hours CBC:  Lab 12/22/11 0425 12/21/11 0415 12/20/11 0655 12/19/11 1739  WBC 3.0* 4.3 9.2 9.1  NEUTROABS -- -- -- 7.4  HGB 11.5* 10.8* 10.9* 9.6*  HCT 35.6* 33.9* 34.3* 30.0*  MCV 97.3 100.0 101.2* 99.0  PLT 118* 109* 99* 109*   Cardiac Enzymes:  Lab 12/20/11 1638 12/20/11 0655 12/19/11 2333  CKTOTAL 242* 291* 318*  CKMB 7.0* 7.4* 7.2*  CKMBINDEX -- -- --  TROPONINI <0.30 <0.30 <0.30   CBG:  Lab 12/21/11 2016 12/21/11 1157 12/21/11 0754 12/21/11 0120 12/20/11 2059  GLUCAP 92 122* 90 102* 105*     Physical Exam:  Blood pressure 153/99, pulse 76, temperature 97.9 F (36.6 C), temperature source Oral, resp.  rate 21, height 6' (1.829 m), weight 71 kg (156 lb 8.4 oz), SpO2 97.00%.  General: looks better Neck: Supple. JVD not elevated.  Chest: clear bilat  Heart: RRR with S1 S2. No rub or gallop. Abdomen: Soft, tender mid abdomen Lower extremities: without edema or ischemic changes  Neuro: Alert and oriented X 3. Skin: pale  Dialysis Access:left upper AVGG patent   Dialysis Orders: Center: [facility] on MWF  EDW 70 HD Bath 2K 2.25 Ca Time 4 Heparin 2000. Access left upper AVGG BFR 400 DFR 800  Zemplar 1 mcg IV/HD Epogen 4200 Units IV/HD Venofer 100/week  Other profile 4; MRSA + status    Assessment/Plan 1. Abdominal pain - in the setting of etoh abuse with hx pancreatitis; improving. Lipase is normal, on enzymes and PPI. May benefit from chronic pain regimen.  2. HTN/volume - On clonidine 0.3 bid, fosinopril 10/hs at home. Just getting the clonidine here for now.  3. ESRD / MWF at [facility]- had extra HD sat  for hyperkalemia. HD first shift in am. He is 1 kg over dry wt.  4. Hyperkalemia- resolved 5. ETOH abuse- chronic issue 6. Anemia - Hgb stable - continue ESA and IV Fe as outpt 7. MBD - continue binders and zemplar 1; d/c ca with vit D; give phoslo and fosrenol only. Phos 9.9   Vinson Moselle  MD [facility] [phone] pgr    [phone] cell 12/22/2011, 2:32 PM

## 2011-12-23 ENCOUNTER — Inpatient Hospital Stay (HOSPITAL_COMMUNITY): Payer: Medicare Other

## 2011-12-23 LAB — GLUCOSE, CAPILLARY: Glucose-Capillary: 107 mg/dL — ABNORMAL HIGH (ref 70–99)

## 2011-12-23 LAB — RENAL FUNCTION PANEL
BUN: 29 mg/dL — ABNORMAL HIGH (ref 6–23)
Glucose, Bld: 126 mg/dL — ABNORMAL HIGH (ref 70–99)
Phosphorus: 5.8 mg/dL — ABNORMAL HIGH (ref 2.3–4.6)
Potassium: 3.6 mEq/L (ref 3.5–5.1)

## 2011-12-23 LAB — CBC
HCT: 33.1 % — ABNORMAL LOW (ref 39.0–52.0)
Hemoglobin: 11 g/dL — ABNORMAL LOW (ref 13.0–17.0)
MCHC: 33.2 g/dL (ref 30.0–36.0)

## 2011-12-23 MED ORDER — PARICALCITOL 5 MCG/ML IV SOLN
INTRAVENOUS | Status: AC
  Administered 2011-12-23: 1 ug via INTRAVENOUS
  Filled 2011-12-23: qty 1

## 2011-12-23 MED ORDER — HYDROMORPHONE HCL PF 1 MG/ML IJ SOLN
INTRAMUSCULAR | Status: AC
  Administered 2011-12-23: 1 mg via INTRAVENOUS
  Filled 2011-12-23: qty 1

## 2011-12-23 MED ORDER — HYDROCODONE-ACETAMINOPHEN 5-325 MG PO TABS
2.0000 | ORAL_TABLET | Freq: Once | ORAL | Status: AC
  Administered 2011-12-23: 2 via ORAL
  Filled 2011-12-23: qty 2

## 2011-12-23 MED ORDER — LORAZEPAM 2 MG/ML IJ SOLN
INTRAMUSCULAR | Status: AC
  Filled 2011-12-23: qty 1

## 2011-12-23 MED ORDER — DARBEPOETIN ALFA-POLYSORBATE 25 MCG/0.42ML IJ SOLN
INTRAMUSCULAR | Status: AC
  Administered 2011-12-23: 12.5 ug via INTRAVENOUS
  Filled 2011-12-23: qty 0.42

## 2011-12-23 MED ORDER — NEPRO/CARBSTEADY PO LIQD: 237.0000 mL | Freq: Three times a day (TID) | ORAL | Status: DC

## 2011-12-23 MED ORDER — LORAZEPAM 1 MG PO TABS
1.0000 mg | ORAL_TABLET | Freq: Once | ORAL | Status: AC
  Administered 2011-12-23: 1 mg via ORAL
  Filled 2011-12-23: qty 1

## 2011-12-23 MED ORDER — RENA-VITE PO TABS: 1.0000 | ORAL_TABLET | Freq: Every day | ORAL | Status: DC

## 2011-12-23 NOTE — Progress Notes (Signed)
S: feels well now. No c/o nausea/vomiting, minimum abdominal pain. Feels stable to go home O: General: mild distress due to acute illness. Eyes: PERRLA. Neck: supple, no masses, no carotid bruits appreciated, no JVD Lungs: CTA B/L Heart: RRR, no M/G/R GU/Abdomen: BS normoactive. Soft, mild tenderness to palpation right abdominal wall. No masses or organomegaly appreciated  Extremities: No pretibial edema, distal pedal pulses intact bilaterally, multiple excoriations and ecchymosis on all extremities most notable bilateral hands, left forearm and left shin Neurologic: lethargic, easily arousal and follow simple commands, grossly non-focal, visible hands and arms shakes noted.   A/P  - Patient is stable to go home - he states that he has already enrolled in a alcohol cessation program.  - he states that he will try to stop drinking - he is reinforced on his HD schedules as well.' - will set up for him to be followed as outpatient in 2 weeks. I will send a request to front desk pool at clinic for them to call pt in am.

## 2011-12-23 NOTE — Evaluation (Signed)
Physical Therapy Evaluation Patient Details Name: [name] MRN: [id number] DOB: 1955/09/07 Today's Date: 12/23/2011 Time: 3474-2595 PT Time Calculation (min): 20 min  PT Assessment / Plan / Recommendation Clinical Impression  Patient presents with abdominal pain and chronic pancreatitis. He is at baseline for mobility and has no discharge needs.    PT Assessment  Patent does not need any further PT services    Follow Up Recommendations  No PT follow up    Barriers to Discharge  None      lEquipment Recommendations  None recommended by PT    Recommendations for Other Services  None   Frequency  N/A   Precautions / Restrictions Precautions Precautions: None   Pertinent Vitals/Pain VSS / No pain reported      Mobility  Bed Mobility Bed Mobility: Supine to Sit;Sitting - Scoot to Edge of Bed;Sit to Supine Supine to Sit: 7: Independent Sitting - Scoot to Edge of Bed: 7: Independent Sit to Supine: 7: Independent Transfers Transfers: Sit to Stand;Stand to Sit Sit to Stand: From bed;Without upper extremity assist;With upper extremity assist;7: Independent Stand to Sit: 7: Independent;To bed;Without upper extremity assist;With upper extremity assist Ambulation/Gait Ambulation/Gait Assistance: 7: Independent Ambulation Distance (Feet): 280 Feet Assistive device: None Gait Pattern: Within Functional Limits       Visit Information  Last PT Received On: 12/23/11    Subjective Data  Subjective: Patient reports he wishes to go home today Patient Stated Goal: Home   Prior Functioning  Home Living Lives With:  (step son) Available Help at Discharge: Family;Available PRN/intermittently Type of Home: House Home Access: Level entry Home Layout: One level Home Adaptive Equipment: Walker - rolling;Straight cane Prior Function Level of Independence: Independent Driving: No Communication Communication: No difficulties    Cognition  Overall Cognitive Status:  Appears within functional limits for tasks assessed/performed Arousal/Alertness: Awake/alert Orientation Level: Appears intact for tasks assessed Behavior During Session: [facility] for tasks performed    Extremity/Trunk Assessment Right Lower Extremity Assessment RLE ROM/Strength/Tone: Within functional levels RLE Sensation: WFL - Light Touch;WFL - Proprioception RLE Coordination: WFL - gross/fine motor Left Lower Extremity Assessment LLE ROM/Strength/Tone: Within functional levels LLE Sensation: WFL - Light Touch;WFL - Proprioception LLE Coordination: WFL - gross/fine motor Trunk Assessment Trunk Assessment: Normal   Balance Standardized Balance Assessment Standardized Balance Assessment: Berg Balance Test Berg Balance Test Sit to Stand: Able to stand without using hands and stabilize independently Standing Unsupported: Able to stand safely 2 minutes Sitting with Back Unsupported but Feet Supported on Floor or Stool: Able to sit safely and securely 2 minutes Stand to Sit: Sits safely with minimal use of hands Transfers: Able to transfer safely, minor use of hands Standing Unsupported with Eyes Closed: Able to stand 10 seconds safely Standing Ubsupported with Feet Together: Able to place feet together independently and stand 1 minute safely From Standing, Reach Forward with Outstretched Arm: Can reach confidently >25 cm (10") From Standing Position, Pick up Object from Floor: Able to pick up shoe safely and easily From Standing Position, Turn to Look Behind Over each Shoulder: Looks behind from both sides and weight shifts well Turn 360 Degrees: Able to turn 360 degrees safely in 4 seconds or less Standing Unsupported, Alternately Place Feet on Step/Stool: Able to stand independently and safely and complete 8 steps in 20 seconds Standing Unsupported, One Foot in Front: Able to plae foot ahead of the other independently and hold 30 seconds Standing on One Leg: Able to lift leg independently  and hold > 10 seconds Total Score: 55   End of Session PT - End of Session Equipment Utilized During Treatment: Gait belt Activity Tolerance: Patient tolerated treatment well Patient left: in bed Psychiatrist) Nurse Communication: Mobility status   Edwyna Perfect, PT  Pager [phone]  12/23/2011, 1:49 PM

## 2011-12-23 NOTE — Discharge Instructions (Signed)
1. Follow up with internal medicine outpatient clinic in 2 weeks 2. Stop drinking. 3. Go to your hemodialysis as scheduled.

## 2011-12-23 NOTE — Progress Notes (Signed)
Internal Medicine Teaching Service Attending Note Date: 12/23/2011  Patient name: SAMI FROH  Medical record number: [id number]  Date of birth: Apr 27, 1956    This patient has been seen and discussed with the house staff. Please see their note for complete details. I concur with their findings with the following additions/corrections: Mr Pink is at baseline. He was seen during his HD session. He was not tremulous and able to eat and drink normally. He has not been a candidate for inpt ETOH tx due to his need to leave for HD sessions. He is already plugged into oupt ETOH tx (AA I believe) and will resume at D/C. Stabel fro D/C home today.  Zhoey Blackstock 12/23/2011, 1:15 PM

## 2011-12-23 NOTE — Progress Notes (Signed)
Subjective:  No complaints.  Objective:    Vital signs in last 24 hours: Filed Vitals:   12/23/11 1000 12/23/11 1026 12/23/11 1048 12/23/11 1100  BP: 177/104 171/103 179/107 168/106  Pulse: 71 73 67 69  Temp:      TempSrc:      Resp: 12 13 11 11   Height:      Weight:      SpO2:       Weight change:  No intake or output data in the 24 hours ending 12/23/11 1151 Labs: Basic Metabolic Panel:  Lab 12/23/11 1478 12/22/11 0425 12/21/11 0415 12/20/11 1638 12/20/11 0655 12/19/11 2018 12/19/11 1739  NA 134* 135 136 138 129* 135 132*  K 3.6 3.7 6.2* 4.1 7.9* 5.4* 7.3*  CL 94* 95* 94* 94* 92* 93* 91*  CO2 23 27 25 26  15* 20 17*  GLUCOSE 126* 153* 95 70 91 23* 80  BUN 29* 14 26* 20 62* 60* 58*  CREATININE 7.45* 5.11* 6.81* 5.69* 11.46* 11.36* 11.33*  ALB -- -- -- -- -- -- --  CALCIUM 9.0 9.4 8.8 9.3 8.4 8.8 8.4  PHOS 5.8* 4.4 9.9* -- -- -- --   Liver Function Tests:  Lab 12/23/11 0747 12/22/11 0425 12/21/11 0415 12/19/11 2018 12/19/11 1739  AST -- -- -- 32 36  ALT -- -- -- 20 22  ALKPHOS -- -- -- 104 113  BILITOT -- -- -- 0.2* 0.2*  PROT -- -- -- 7.2 7.3  ALBUMIN 2.7* 3.1* 3.1* -- --    Lab 12/19/11 1739  LIPASE 15  AMYLASE --   No results found for this basename: AMMONIA:3 in the last 168 hours CBC:  Lab 12/23/11 0747 12/22/11 0425 12/21/11 0415 12/20/11 0655 12/19/11 1739  WBC 3.3* 3.0* 4.3 9.2 --  NEUTROABS -- -- -- -- 7.4  HGB 11.0* 11.5* 10.8* 10.9* --  HCT 33.1* 35.6* 33.9* 34.3* --  MCV 95.9 97.3 100.0 101.2* --  PLT 116* 118* 109* 99* --   Cardiac Enzymes:  Lab 12/20/11 1638 12/20/11 0655 12/19/11 2333  CKTOTAL 242* 291* 318*  CKMB 7.0* 7.4* 7.2*  CKMBINDEX -- -- --  TROPONINI <0.30 <0.30 <0.30   CBG:  Lab 12/22/11 2101 12/22/11 1657 12/21/11 2016 12/21/11 1157 12/21/11 0754  GLUCAP 133* 101* 92 122* 90     Physical Exam:  Blood pressure 168/106, pulse 69, temperature 97.8 F (36.6 C), temperature source Oral, resp. rate 11, height 6' (1.829 m),  weight 71.6 kg (157 lb 13.6 oz), SpO2 97.00%.  General: looks better Neck: Supple. JVD not elevated.  Chest: clear bilat  Heart: RRR with S1 S2. No rub or gallop. Abdomen: Soft, tender mid abdomen Lower extremities: without edema or ischemic changes  Neuro: Alert and oriented X 3. Skin: pale  Dialysis Access:left upper AVGG patent   Dialysis Orders: Center: [facility] on MWF  EDW 70 HD Bath 2K 2.25 Ca Time 4 Heparin 2000. Access left upper AVGG BFR 400 DFR 800  Zemplar 1 mcg IV/HD Epogen 4200 Units IV/HD Venofer 100/week  Other profile 4; MRSA + status    Assessment/Plan 1. Abdominal pain - in the setting of etoh abuse with hx pancreatitis; improving. Lipase is normal, on enzymes and PPI. May benefit from chronic pain regimen.  2. HTN/volume - On clonidine 0.3 bid, fosinopril 10/hs at home. Just getting the clonidine here for now.  3. ESRD / MWF at [facility]- had extra HD sat for hyperkalemia. HD today, 2kg UF. Just over dry wt.  4. Hyperkalemia- resolved 5. ETOH abuse- chronic issue 6. Anemia - Hgb stable - continue ESA and IV Fe as outpt 7. MBD - continue binders and zemplar 1; d/c ca with vit D; give phoslo 1ac and fosrenol 1gm ac tid only. Phos 9.9 8. Dispo- per primary   Vinson Moselle  MD [facility] [phone] pgr    [phone] cell 12/23/2011, 11:51 AM

## 2011-12-23 NOTE — Progress Notes (Signed)
Pt received discharge instructions with no further questions. Received new prescriptions and is aware of what medications to stop taking. Pt got medications back from pharmacy and pocket knife back from security. He is encouraged to stop drinking and continue with AA. He knows he will have a follow up appointment in 2 weeks. IV DC'd. Stable for DC. NT to take in wheelchair for friend, Kriste Basque to drive home. Duwaine Maxin, RN

## 2011-12-24 NOTE — Discharge Summary (Signed)
Internal Medicine [facility] Discharge Note  Name: [name] MRN: [id number] DOB: 01/21/1956 56 y.o.  Date of Admission: 12/19/2011  4:08 PM Date of Discharge: 12/23/2011 Attending Physician: Dr. Blanch Media  Discharge Diagnosis: 1. Syncope 2. Hyperkalemia 3. Metabolic acidosis and metabolic alkolosis 4. ESRD with HD  5. Abdominal pain, generalized 6. Alcoholism with alcohol withdrawal 7. History of peptic ulcer disease 8. Hepatitis C 9. Anemia of chronic disease 10.  Hypertension 11. COPD (chronic obstructive pulmonary disease)  Discharge Medications: Medication List  As of 12/24/2011  3:20 PM   STOP taking these medications         diazepam 5 MG tablet      zolpidem 5 MG tablet         TAKE these medications         albuterol 108 (90 BASE) MCG/ACT inhaler   Commonly known as: PROVENTIL HFA;VENTOLIN HFA   Inhale 2 puffs into the lungs every 4 (four) hours as needed. For shortness of breath.      calcium acetate 667 MG capsule   Commonly known as: PHOSLO   Take 1 capsule (667 mg total) by mouth 3 (three) times daily with meals.      calcium-vitamin D 500-200 MG-UNIT per tablet   Commonly known as: OSCAL WITH D   Take 1 tablet by mouth every 6 (six) hours as needed.      cloNIDine 0.3 MG tablet   Commonly known as: CATAPRES   Take 1 tablet (0.3 mg total) by mouth 2 (two) times daily.      feeding supplement (NEPRO CARB STEADY) Liqd   Take 237 mLs by mouth 3 (three) times daily with meals.      folic acid 1 MG tablet   Commonly known as: FOLVITE   Take 1 tablet (1 mg total) by mouth daily.      fosinopril 10 MG tablet   Commonly known as: MONOPRIL   Take 1 tablet (10 mg total) by mouth at bedtime.      lanthanum 1000 MG chewable tablet   Commonly known as: FOSRENOL   Chew 1,000 mg by mouth 3 (three) times daily with meals.      latanoprost 0.005 % ophthalmic solution   Commonly known as: XALATAN   Place 1 drop into both eyes  daily.      lipase/protease/amylase 40981 UNITS Cpep   Commonly known as: CREON-10/PANCREASE   Take 1 capsule by mouth 3 (three) times daily before meals.      multivitamin Tabs tablet   Take 1 tablet by mouth at bedtime.      ondansetron 4 MG tablet   Commonly known as: ZOFRAN   Take 1 tablet (4 mg total) by mouth every 8 (eight) hours as needed. For nausea.      pantoprazole 40 MG tablet   Commonly known as: PROTONIX   Take 1 tablet (40 mg total) by mouth daily.      pregabalin 75 MG capsule   Commonly known as: LYRICA   Take 1 capsule (75 mg total) by mouth 3 (three) times daily.      thiamine 100 MG tablet   Take 1 tablet (100 mg total) by mouth daily.      tiotropium 18 MCG inhalation capsule   Commonly known as: SPIRIVA   Place 1 capsule (18 mcg total) into inhaler and inhale daily.      traMADol 50 MG tablet   Commonly known as: [name]  Take 2 tablets (100 mg total) by mouth every 6 (six) hours as needed for pain.            Disposition and follow-up:   [name] was discharged from [facility] in Stable condition.   Follow-up Appointments: Follow-up Information    Please follow up with Dr. Melida Quitter at [facility] internal medicine at 145 pm on 01/09/12   1. Patient was admitted for syncope likely due to alcohol intoxication vs withdrawal. He is instructed to follow up with the alcohol cessation program ([facility]). 2. Please check his BMP 3. Please assess medical compliance with his meds and HD sessions     Discharge Orders    Future Appointments: Provider: Department: Dept Phone: Center:   01/09/2012 1:45 PM Melida Quitter, MD [facility] [phone] [facility]   01/22/2012 8:30 AM Rachael Fee, MD [facility] [phone] [id number]      Consultations: Treatment Team:  Maree Krabbe, MD  Procedures Performed:  Dg Chest 1 View  12/19/2011  *RADIOLOGY REPORT*  Clinical Data: Syncope.  Fall.  Pain.  CHEST - 1 VIEW   Comparison: 10/24/2011  Findings: Cardiomegaly with cephalization of blood flow noted, compatible with pulmonary venous hypertension.  No pneumothorax is observed.  Indistinct density along the lingula probably represents the scarring shown on prior chest CT.  No pneumothorax or pulmonary contusion observed.  IMPRESSION:  1.  Cardiomegaly and pulmonary venous hypertension. 2.  Mild lingular scarring.  Original Report Authenticated By: Dellia Cloud, M.D.   Dg Forearm Left  12/19/2011  *RADIOLOGY REPORT*  Clinical Data: Syncope.  Fall.  Left forearm pain and bruising distally.  LEFT FOREARM - 2 VIEW  Comparison: None.  Findings: Vascular clips and stent projects in the vicinity of the elbow.  Subcutaneous swelling noted along the ulnar side of the mid forearm.  Vascular shunt noted.  No fracture is observed.  IMPRESSION:  1.  Dialysis graft/shunt in the forearm. 2.  Soft tissue swelling medially. 3.  No fracture or acute bony findings are observed.  Original Report Authenticated By: Dellia Cloud, M.D.   Ct Head Wo Contrast  12/19/2011  *RADIOLOGY REPORT*  Clinical Data:  Fall, generalized weakness  CT HEAD WITHOUT CONTRAST CT CERVICAL SPINE WITHOUT CONTRAST  Technique:  Multidetector CT imaging of the head and cervical spine was performed following the standard protocol without intravenous contrast.  Multiplanar CT image reconstructions of the cervical spine were also generated.  Comparison:  None.  CT HEAD  Findings: No evidence of parenchymal hemorrhage or extra-axial fluid collection. No mass lesion, mass effect, or midline shift.  No CT evidence of acute infarction.  Cortical and central atrophy with secondary ventricular prominence.  The visualized paranasal sinuses are essentially clear. The mastoid air cells are unopacified.  No evidence of calvarial fracture.  IMPRESSION: No evidence of acute intracranial abnormality.  Atrophy with secondary ventricular prominence.  CT CERVICAL SPINE   Findings: Normal cervical lordosis.  Evidence of fracture or dislocation.  Vertebral body heights are maintained.  The dens appears intact.  No prevertebral soft tissue swelling.  Mild multilevel degenerative changes.  Visualized thyroid is unremarkable.  Visualized lung apices are clear.  Small bilateral cervical, supraclavicular, and upper mediastinal lymph nodes which do not meet pathologic CT size criteria.  IMPRESSION: No evidence of traumatic injury to the cervical spine.  Mild multilevel degenerative changes.  Original Report Authenticated By: Charline Bills, M.D.   Ct Cervical Spine Wo Contrast  12/19/2011  *RADIOLOGY REPORT*  Clinical Data:  Fall, generalized weakness  CT HEAD WITHOUT CONTRAST CT CERVICAL SPINE WITHOUT CONTRAST  Technique:  Multidetector CT imaging of the head and cervical spine was performed following the standard protocol without intravenous contrast.  Multiplanar CT image reconstructions of the cervical spine were also generated.  Comparison:  None.  CT HEAD  Findings: No evidence of parenchymal hemorrhage or extra-axial fluid collection. No mass lesion, mass effect, or midline shift.  No CT evidence of acute infarction.  Cortical and central atrophy with secondary ventricular prominence.  The visualized paranasal sinuses are essentially clear. The mastoid air cells are unopacified.  No evidence of calvarial fracture.  IMPRESSION: No evidence of acute intracranial abnormality.  Atrophy with secondary ventricular prominence.  CT CERVICAL SPINE  Findings: Normal cervical lordosis.  Evidence of fracture or dislocation.  Vertebral body heights are maintained.  The dens appears intact.  No prevertebral soft tissue swelling.  Mild multilevel degenerative changes.  Visualized thyroid is unremarkable.  Visualized lung apices are clear.  Small bilateral cervical, supraclavicular, and upper mediastinal lymph nodes which do not meet pathologic CT size criteria.  IMPRESSION: No evidence of  traumatic injury to the cervical spine.  Mild multilevel degenerative changes.  Original Report Authenticated By: Charline Bills, M.D.   US Abdomen Complete  12/20/2011  *RADIOLOGY REPORT*  Clinical Data:  Abdominal pain, status post fall.  End-stage renal disease, hepatitis C, alcohol use, dialysis.  ABDOMINAL ULTRASOUND COMPLETE  Comparison:  Abdominal ultrasound 07/14/2011  Findings:  Gallbladder:  No gallstones, gallbladder wall thickening, or pericholecystic fluid. Negative sonographic Murphy's sign.  Common Bile Duct:  Within normal limits in caliber. Measures 4 mm.  Liver: No focal mass lesion identified.  Within normal limits in parenchymal echogenicity. Liver contour appears smooth by ultrasound.  Portal vein is patent and demonstrates patent pedal flow.  IVC:  Appears normal.  Pancreas:  Although the pancreas is difficult to visualize in its entirety, no focal pancreatic abnormality is identified.  Spleen:  Within normal limits in size and echotexture. Measures 9.0 cm in length.  Right kidney:  Small (7 cm) and markedly echogenic, consistent with end-stage renal disease.  Negative for hydronephrosis.  No mass visible by ultrasound.  Left kidney:  A small (7 cm) a markedly echogenic, consistent with end-stage renal disease.  Negative for hydronephrosis.  No mass visualized.  Abdominal Aorta:  No aneurysm identified.Maximal AP diameter is 2.5 cm.  The right pleural effusion is visualized.  IMPRESSION:  1.  No acute findings identified in the abdomen. 2.  Right pleural effusion. 3.  Small and echogenic kidneys, consistent with end-stage renal disease.  Original Report Authenticated By: Britta Mccreedy, M.D.   Admission HPI:  Mr. Prosch is 56 yo man with hx significant for chronic alcoholism, chronic recurrent pancreatitis, ESRD on HD, chronic abdominal pain, PUD, COPD and hypertension who presents to ED with reports of passing out and falling into a rose bush this morning. He reports that he arrived to  hemodialysis yesterday with blood pressure in the "200s". He states that although he only received dialysis for 2.5 hours due to leg cramping that he had some fluid removed and blood pressure "got to normal". He states that he had been without alcohol for ~ 1 month but starting drinking again about 3 weeks ago. Prior to presentation in the ED he went from 5pm to 9am without drinking alcohol but ended up drinking alcohol a little after 9am this morning. He states that  he was in his usual state of health without chest pain or shortness of breath today. Later this afternoon he walked out into his yard, felt the yard "spinning" and the next thing he remembers is waking up in his neighbors rose bush. States that neighbor witnessed the event, denies seizure activity, denies post-syncopal disorientation, dyspnea, loss of urine or stool or tongue biting with spontaneous return to alertness. He denies prodromal symptoms specifically no nausea, diaphoresis, lightheadedness or changes in vision but complains of worsening abdominal pain above his baseline with new tenderness especially on right side of abdomen.  ED course: received Kayexalate, dextrose, calcium gluconate and insulin 10 units for elevated potassium of 7.3; Ativan 1 mg x1, Zofran and 1 amp sodium bicarbonate  Hospital Course by problem list: 1. Syncope     Patient is well known to our service. He presented with one episode of syncope on admission in the setting of Alcohol abuse relapse.  He had no focal neuro deficit and head CT was negative which ruled out neurological reasons. He denied chest pain. His cardiac enzymes were positive but TROPONIN were negative and EKG was unremarkable for acute cardiac events. He was orthostatic by pulse and was given gentle hydration. The etiology was likely either alcohol intoxication or withdrawal. Patient was treated with CIWA protocol and gentle IVF infusion. He underwent urgent HD to correct hyperkalemia and Metabolic  acidosis. He has improved significantly upon discharge. No more syncope episodes or dizziness/lightheadedness. He is stable and will be discharged home with instruction to stop drinking alcohol and follow up with the alcohol cessation program that he has enrolled in. Patient agrees with the plan and will follow up with the [facility] clinic at 145 pm on 01/09/12.  2. Hyperkalemia     Patient missed one session of his HD prior to admission because he was drinking. He was noted to have potassium of 7.3 which was treated with IV calcium gluconate, insulin, Kayexalate on admission. His nephrologist was consulted and an urgent hemodialysis was done. He subsequently received HD per renal during this admission and his hyperkalemia resolved. His K is 3.6 upon discharge. He will continue to follow up with his Nephrologist for HD as an outpatient.  3. Mixed Metabolic acidosis and metabolic alkalosis associated with missing his HD session.      His ABG was 7.19/49/71 with Cow of 17, and his Delta-Delta ratio was 2 indicating mixed metabolic acidosis (renal failure and alcoholic ketoacidosis) and metabolic alkalosis (diuresis from alcohol). He underwent HD by renal with his CO2 back to 23 upon discharge. He still has a AG of 17 which is chronic when reviewing his previous labs, and the etiology is likely secondary to his ESRD.   4. ESRD with HD, MWF   5. Abdominal pain, generalized, in the setting of chronic abdominal pain.      He has had chronic abdominal pain. His lipase was normal, FOBT was negtive for bleeding and his abdominal U/S was negative for acute findings. The etiology is likely multifactorial related to his heavy alcohol abuse, acute electrolyte imbalance and acidotic change 2/2 missing HD. Patient was treated with gentle IVF hydration, pain management and correcting underlying disease process.   His symptoms is largely resolved and he is stable upon discharge.   6. Alcoholism with alcohol withdrawal      This is his chronic problem. He has not been a candidate for inpt ETOH treatment due to his need to leave for HD sessions. He has already  been set up with an outpatient alcohol cessation program (I believes it is [facility]). He always has tremors.   During this admission, he was noted to have bilateral arms/hands shaking and tremors which significantly increased when staff around. It was also noted that his tremors would stop when phlebotomy tech drew his blood. Nursing staff was suggested  to observe him at doorway for CIWA assessment. He received several dose of Ativan per CIWA. And He is stable without any tremors upon discharge. He is able to drink and eat and ambulate normally.  He will discharged home with continuous outpatient alcohol tx.   7. History of peptic ulcer disease, stable, continue PPI 8. All other chronic problems are stable.  Discharge Vitals:  BP 156/73  Pulse 84  Temp(Src) 97.5 F (36.4 C) (Axillary)  Resp 18  Ht 6' (1.829 m)  Wt 152 lb 12.5 oz (69.3 kg)  BMI 20.72 kg/m2  SpO2 100%  Discharge Labs:  Results for orders placed during the hospital encounter of 12/19/11 (from the past 24 hour(s))  GLUCOSE, CAPILLARY     Status: Normal   Collection Time   12/23/11  4:10 PM      Component Value Range   Glucose-Capillary 96  70 - 99 (mg/dL)   Comment 1 Notify RN      Signed: Aylssa Herrig 12/24/2011, 3:20 PM   Time Spent on Discharge: 45 minutes

## 2011-12-26 LAB — CULTURE, BLOOD (ROUTINE X 2)
Culture  Setup Time: 201305171348
Culture: NO GROWTH

## 2012-01-09 ENCOUNTER — Encounter: Payer: Medicare Other | Admitting: Internal Medicine

## 2012-01-10 ENCOUNTER — Encounter (HOSPITAL_COMMUNITY): Payer: Self-pay | Admitting: Emergency Medicine

## 2012-01-10 ENCOUNTER — Other Ambulatory Visit: Payer: Self-pay

## 2012-01-10 ENCOUNTER — Emergency Department (HOSPITAL_COMMUNITY): Payer: Medicare Other

## 2012-01-10 ENCOUNTER — Inpatient Hospital Stay (HOSPITAL_COMMUNITY)
Admission: EM | Admit: 2012-01-10 | Discharge: 2012-01-14 | DRG: 391 | Disposition: A | Discharge status: Home / Self Care | Payer: Medicare Other | Attending: Infectious Disease | Admitting: Infectious Disease

## 2012-01-10 DIAGNOSIS — Z992 Dependence on renal dialysis: Secondary | ICD-10-CM

## 2012-01-10 DIAGNOSIS — E871 Hypo-osmolality and hyponatremia: Secondary | ICD-10-CM | POA: Diagnosis present

## 2012-01-10 DIAGNOSIS — K2920 Alcoholic gastritis without bleeding: Secondary | ICD-10-CM | POA: Diagnosis present

## 2012-01-10 DIAGNOSIS — I10 Essential (primary) hypertension: Secondary | ICD-10-CM | POA: Diagnosis present

## 2012-01-10 DIAGNOSIS — F1010 Alcohol abuse, uncomplicated: Secondary | ICD-10-CM | POA: Diagnosis present

## 2012-01-10 DIAGNOSIS — R109 Unspecified abdominal pain: Secondary | ICD-10-CM

## 2012-01-10 DIAGNOSIS — Z87891 Personal history of nicotine dependence: Secondary | ICD-10-CM

## 2012-01-10 DIAGNOSIS — F10929 Alcohol use, unspecified with intoxication, unspecified: Secondary | ICD-10-CM

## 2012-01-10 DIAGNOSIS — J449 Chronic obstructive pulmonary disease, unspecified: Secondary | ICD-10-CM | POA: Diagnosis present

## 2012-01-10 DIAGNOSIS — B182 Chronic viral hepatitis C: Secondary | ICD-10-CM | POA: Diagnosis present

## 2012-01-10 DIAGNOSIS — Z79899 Other long term (current) drug therapy: Secondary | ICD-10-CM

## 2012-01-10 DIAGNOSIS — N186 End stage renal disease: Secondary | ICD-10-CM | POA: Diagnosis present

## 2012-01-10 DIAGNOSIS — E46 Unspecified protein-calorie malnutrition: Secondary | ICD-10-CM | POA: Diagnosis present

## 2012-01-10 DIAGNOSIS — F1020 Alcohol dependence, uncomplicated: Secondary | ICD-10-CM | POA: Diagnosis present

## 2012-01-10 DIAGNOSIS — N2581 Secondary hyperparathyroidism of renal origin: Secondary | ICD-10-CM | POA: Diagnosis present

## 2012-01-10 DIAGNOSIS — E875 Hyperkalemia: Secondary | ICD-10-CM | POA: Diagnosis present

## 2012-01-10 DIAGNOSIS — J4489 Other specified chronic obstructive pulmonary disease: Secondary | ICD-10-CM | POA: Diagnosis present

## 2012-01-10 DIAGNOSIS — E872 Acidosis, unspecified: Secondary | ICD-10-CM | POA: Diagnosis present

## 2012-01-10 DIAGNOSIS — I120 Hypertensive chronic kidney disease with stage 5 chronic kidney disease or end stage renal disease: Secondary | ICD-10-CM | POA: Diagnosis present

## 2012-01-10 DIAGNOSIS — F10939 Alcohol use, unspecified with withdrawal, unspecified: Secondary | ICD-10-CM | POA: Diagnosis not present

## 2012-01-10 DIAGNOSIS — K861 Other chronic pancreatitis: Secondary | ICD-10-CM | POA: Diagnosis present

## 2012-01-10 DIAGNOSIS — D61818 Other pancytopenia: Secondary | ICD-10-CM | POA: Diagnosis present

## 2012-01-10 DIAGNOSIS — F419 Anxiety disorder, unspecified: Secondary | ICD-10-CM | POA: Diagnosis present

## 2012-01-10 DIAGNOSIS — F10239 Alcohol dependence with withdrawal, unspecified: Secondary | ICD-10-CM | POA: Diagnosis not present

## 2012-01-10 DIAGNOSIS — R1084 Generalized abdominal pain: Principal | ICD-10-CM | POA: Diagnosis present

## 2012-01-10 LAB — COMPREHENSIVE METABOLIC PANEL
ALT: 18 U/L (ref 0–53)
AST: 28 U/L (ref 0–37)
CO2: 17 mEq/L — ABNORMAL LOW (ref 19–32)
Chloride: 83 mEq/L — ABNORMAL LOW (ref 96–112)
GFR calc non Af Amer: 5 mL/min — ABNORMAL LOW (ref >90)
Sodium: 123 mEq/L — ABNORMAL LOW (ref 135–145)
Total Bilirubin: 0.3 mg/dL (ref 0.3–1.2)

## 2012-01-10 LAB — CBC
Platelets: 135 10*3/uL — ABNORMAL LOW (ref 150–400)
RDW: 17.1 % — ABNORMAL HIGH (ref 11.5–15.5)
WBC: 3.7 10*3/uL — ABNORMAL LOW (ref 4.0–10.5)

## 2012-01-10 LAB — DIFFERENTIAL
Basophils Absolute: 0 10*3/uL (ref 0.0–0.1)
Lymphocytes Relative: 45 % (ref 12–46)
Neutro Abs: 1.9 10*3/uL (ref 1.7–7.7)
Neutrophils Relative %: 50 % (ref 43–77)

## 2012-01-10 MED ORDER — SODIUM POLYSTYRENE SULFONATE 15 GM/60ML PO SUSP
60.0000 g | Freq: Once | ORAL | Status: AC
  Administered 2012-01-11: 60 g via ORAL
  Filled 2012-01-10 (×3): qty 240

## 2012-01-10 MED ORDER — SODIUM CHLORIDE 0.9 % IJ SOLN: 3.0000 mL | Freq: Two times a day (BID) | INTRAMUSCULAR | Status: DC

## 2012-01-10 MED ORDER — ALBUTEROL SULFATE (5 MG/ML) 0.5% IN NEBU: 2.5000 mg | INHALATION_SOLUTION | RESPIRATORY_TRACT | Status: DC | PRN

## 2012-01-10 MED ORDER — CLONIDINE HCL 0.3 MG/24HR TD PTWK
0.3000 mg | MEDICATED_PATCH | TRANSDERMAL | Status: DC
  Administered 2012-01-11: 0.3 mg via TRANSDERMAL
  Filled 2012-01-10 (×2): qty 1

## 2012-01-10 MED ORDER — SODIUM CHLORIDE 0.9 % IJ SOLN: 3.0000 mL | INTRAMUSCULAR | Status: DC | PRN

## 2012-01-10 MED ORDER — ADULT MULTIVITAMIN W/MINERALS CH
1.0000 | ORAL_TABLET | Freq: Every day | ORAL | Status: DC
  Administered 2012-01-11 – 2012-01-14 (×3): 1 via ORAL
  Filled 2012-01-10 (×6): qty 1

## 2012-01-10 MED ORDER — METOPROLOL TARTRATE 1 MG/ML IV SOLN
5.0000 mg | Freq: Three times a day (TID) | INTRAVENOUS | Status: DC
  Administered 2012-01-11 – 2012-01-14 (×10): 5 mg via INTRAVENOUS
  Filled 2012-01-10 (×20): qty 5

## 2012-01-10 MED ORDER — VITAMIN B-1 100 MG PO TABS
100.0000 mg | ORAL_TABLET | Freq: Every day | ORAL | Status: DC
  Administered 2012-01-11 – 2012-01-14 (×3): 100 mg via ORAL
  Filled 2012-01-10 (×5): qty 1

## 2012-01-10 MED ORDER — MORPHINE SULFATE 2 MG/ML IJ SOLN
2.0000 mg | INTRAMUSCULAR | Status: DC | PRN
  Administered 2012-01-11 – 2012-01-12 (×4): 4 mg via INTRAVENOUS
  Administered 2012-01-12: 2 mg via INTRAVENOUS
  Administered 2012-01-12: 4 mg via INTRAVENOUS
  Administered 2012-01-12 (×2): 2 mg via INTRAVENOUS
  Administered 2012-01-13: 4 mg via INTRAVENOUS
  Filled 2012-01-10: qty 1
  Filled 2012-01-10 (×6): qty 2
  Filled 2012-01-10: qty 1
  Filled 2012-01-10: qty 2
  Filled 2012-01-10: qty 1

## 2012-01-10 MED ORDER — ONDANSETRON HCL 4 MG/2ML IJ SOLN: 4.0000 mg | Freq: Once | INTRAMUSCULAR | Status: DC

## 2012-01-10 MED ORDER — IPRATROPIUM BROMIDE 0.02 % IN SOLN
0.5000 mg | Freq: Four times a day (QID) | RESPIRATORY_TRACT | Status: DC
  Administered 2012-01-10 – 2012-01-11 (×3): 0.5 mg via RESPIRATORY_TRACT
  Filled 2012-01-10 (×3): qty 2.5

## 2012-01-10 MED ORDER — MORPHINE SULFATE 4 MG/ML IJ SOLN
4.0000 mg | Freq: Once | INTRAMUSCULAR | Status: DC
  Administered 2012-01-11: 4 mg via INTRAVENOUS

## 2012-01-10 MED ORDER — ACETAMINOPHEN 325 MG PO TABS
650.0000 mg | ORAL_TABLET | Freq: Four times a day (QID) | ORAL | Status: DC | PRN
  Administered 2012-01-11: 650 mg via ORAL
  Filled 2012-01-10: qty 2

## 2012-01-10 MED ORDER — SODIUM CHLORIDE 0.9 % IV BOLUS (SEPSIS)
1000.0000 mL | Freq: Once | INTRAVENOUS | Status: AC
  Administered 2012-01-10: 1000 mL via INTRAVENOUS

## 2012-01-10 MED ORDER — ONDANSETRON HCL 4 MG/2ML IJ SOLN
4.0000 mg | Freq: Four times a day (QID) | INTRAMUSCULAR | Status: DC | PRN
  Administered 2012-01-11: 4 mg via INTRAVENOUS
  Filled 2012-01-10 (×3): qty 2

## 2012-01-10 MED ORDER — PANTOPRAZOLE SODIUM 40 MG IV SOLR
40.0000 mg | INTRAVENOUS | Status: DC
  Administered 2012-01-11 – 2012-01-13 (×4): 40 mg via INTRAVENOUS
  Filled 2012-01-10 (×8): qty 40

## 2012-01-10 MED ORDER — FUROSEMIDE 10 MG/ML IJ SOLN
INTRAMUSCULAR | Status: AC
  Administered 2012-01-10: 17:00:00
  Filled 2012-01-10: qty 2

## 2012-01-10 MED ORDER — SODIUM CHLORIDE 0.9 % IJ SOLN
3.0000 mL | Freq: Two times a day (BID) | INTRAMUSCULAR | Status: DC
  Administered 2012-01-11 – 2012-01-14 (×5): 3 mL via INTRAVENOUS

## 2012-01-10 MED ORDER — HEPARIN SODIUM (PORCINE) 5000 UNIT/ML IJ SOLN
5000.0000 [IU] | Freq: Three times a day (TID) | INTRAMUSCULAR | Status: DC
  Administered 2012-01-11 – 2012-01-14 (×11): 5000 [IU] via SUBCUTANEOUS
  Filled 2012-01-10 (×18): qty 1

## 2012-01-10 MED ORDER — ACETAMINOPHEN 650 MG RE SUPP: 650.0000 mg | Freq: Four times a day (QID) | RECTAL | Status: DC | PRN

## 2012-01-10 MED ORDER — HYDROMORPHONE HCL PF 1 MG/ML IJ SOLN
1.0000 mg | Freq: Once | INTRAMUSCULAR | Status: AC
  Administered 2012-01-10: 1 mg via INTRAVENOUS
  Filled 2012-01-10: qty 1

## 2012-01-10 MED ORDER — ALBUTEROL SULFATE (5 MG/ML) 0.5% IN NEBU
2.5000 mg | INHALATION_SOLUTION | Freq: Four times a day (QID) | RESPIRATORY_TRACT | Status: DC
  Administered 2012-01-10 – 2012-01-11 (×3): 2.5 mg via RESPIRATORY_TRACT
  Filled 2012-01-10 (×4): qty 0.5

## 2012-01-10 MED ORDER — ONDANSETRON HCL 4 MG PO TABS: 4.0000 mg | ORAL_TABLET | Freq: Four times a day (QID) | ORAL | Status: DC | PRN

## 2012-01-10 MED ORDER — THIAMINE HCL 100 MG/ML IJ SOLN
100.0000 mg | Freq: Every day | INTRAMUSCULAR | Status: DC
  Administered 2012-01-11: 100 mg via INTRAVENOUS
  Filled 2012-01-10 (×7): qty 1

## 2012-01-10 MED ORDER — LORAZEPAM 1 MG PO TABS: 1.0000 mg | ORAL_TABLET | Freq: Four times a day (QID) | ORAL | Status: DC | PRN

## 2012-01-10 MED ORDER — FOLIC ACID 1 MG PO TABS
1.0000 mg | ORAL_TABLET | Freq: Every day | ORAL | Status: DC
  Administered 2012-01-11 – 2012-01-14 (×3): 1 mg via ORAL
  Filled 2012-01-10 (×6): qty 1

## 2012-01-10 MED ORDER — FUROSEMIDE 10 MG/ML IJ SOLN
INTRAMUSCULAR | Status: AC
  Administered 2012-01-10: 19:00:00
  Filled 2012-01-10: qty 8

## 2012-01-10 MED ORDER — IPRATROPIUM BROMIDE 0.02 % IN SOLN
0.5000 mg | RESPIRATORY_TRACT | Status: DC | PRN
  Filled 2012-01-10: qty 2.5

## 2012-01-10 MED ORDER — ONDANSETRON HCL 4 MG/2ML IJ SOLN
4.0000 mg | Freq: Once | INTRAMUSCULAR | Status: AC
  Administered 2012-01-10: 4 mg via INTRAVENOUS
  Filled 2012-01-10: qty 2

## 2012-01-10 MED ORDER — LORAZEPAM 2 MG/ML IJ SOLN
1.0000 mg | Freq: Four times a day (QID) | INTRAMUSCULAR | Status: DC | PRN
  Administered 2012-01-11 (×2): 1 mg via INTRAVENOUS
  Filled 2012-01-10 (×4): qty 1
  Filled 2012-01-10: qty 2

## 2012-01-10 NOTE — ED Notes (Signed)
IV team paged to start IV.   
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
## 2012-01-10 NOTE — Progress Notes (Signed)
Patient arrived via EMS on CPAP.  Patients initial complaint per EMS was pancreatitis pain.  Medic stated that patient had not had dialysis since Monday and that Medic heard Rales on scene.  Medic then placed patient on CPAP.  Upon arrival patient did not seem to be in any Respiratory distress.  RT attempted to take patient of CPAP and place on a 4L nasal cannula.  Patient is tolerating well at this time.  Sats are 99% on 4L .  RR is 18.  BBS are diminished. Dr Ignacia Palma notified.

## 2012-01-10 NOTE — ED Notes (Signed)
Pt brought in for pancreatitis pain and sob. Pt smells of ETOH upon arrival to ED. EMS reports pts last HD was on Monday. Pts HD days are MWF. Pt placed on c-pap by EMS for bilateral rales in all lung fields. Pt given 8mg  morphine IV, SL nitro x3 and 68mg  IV lasix by EMS. Pt taken off Cpap by RT once pt in ED. Pt maintaining O2 sats with [place]

## 2012-01-10 NOTE — ED Notes (Signed)
Pt not co-operating with assessment. Refusing to speak or answer questions

## 2012-01-10 NOTE — ED Provider Notes (Signed)
4:27 PM  Date: 01/10/2012  Rate: 72  Rhythm: normal sinus rhythm  QRS Axis: normal  Intervals: PR prolonged  ST/T Wave abnormalities: normal  Conduction Disutrbances:nonspecific intraventricular conduction delay  Narrative Interpretation: Abnormal EKG  Old EKG Reviewed: unchanged    Carleene Cooper III, MD 01/10/12 [phone]
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
## 2012-01-10 NOTE — ED Notes (Addendum)
Called and gave report to Cybill.

## 2012-01-11 ENCOUNTER — Observation Stay (HOSPITAL_COMMUNITY): Payer: Medicare Other

## 2012-01-11 ENCOUNTER — Encounter (HOSPITAL_COMMUNITY): Payer: Self-pay | Admitting: *Deleted

## 2012-01-11 DIAGNOSIS — D61818 Other pancytopenia: Secondary | ICD-10-CM

## 2012-01-11 DIAGNOSIS — R109 Unspecified abdominal pain: Secondary | ICD-10-CM

## 2012-01-11 LAB — RENAL FUNCTION PANEL
CO2: 26 mEq/L (ref 19–32)
Calcium: 8.9 mg/dL (ref 8.4–10.5)
Chloride: 97 mEq/L (ref 96–112)
GFR calc Af Amer: 12 mL/min — ABNORMAL LOW (ref >90)
Glucose, Bld: 117 mg/dL — ABNORMAL HIGH (ref 70–99)
Potassium: 3.5 mEq/L (ref 3.5–5.1)
Sodium: 140 mEq/L (ref 135–145)

## 2012-01-11 LAB — GLUCOSE, CAPILLARY
Glucose-Capillary: 589 mg/dL (ref 70–99)
Glucose-Capillary: 161 mg/dL — ABNORMAL HIGH (ref 70–99)

## 2012-01-11 LAB — CBC
MCHC: 34.1 g/dL (ref 30.0–36.0)
MCV: 95.2 fL (ref 78.0–100.0)
Platelets: 140 10*3/uL — ABNORMAL LOW (ref 150–400)
RDW: 17.2 % — ABNORMAL HIGH (ref 11.5–15.5)
WBC: 13.4 10*3/uL — ABNORMAL HIGH (ref 4.0–10.5)

## 2012-01-11 MED ORDER — ALBUTEROL SULFATE (5 MG/ML) 0.5% IN NEBU: 2.5000 mg | INHALATION_SOLUTION | Freq: Three times a day (TID) | RESPIRATORY_TRACT | Status: DC

## 2012-01-11 MED ORDER — LORAZEPAM 2 MG/ML IJ SOLN
INTRAMUSCULAR | Status: AC
  Administered 2012-01-11: 2 mg via INTRAVENOUS
  Filled 2012-01-11: qty 1

## 2012-01-11 MED ORDER — IPRATROPIUM BROMIDE 0.02 % IN SOLN
0.5000 mg | Freq: Three times a day (TID) | RESPIRATORY_TRACT | Status: DC
  Administered 2012-01-12 – 2012-01-14 (×6): 0.5 mg via RESPIRATORY_TRACT
  Filled 2012-01-11 (×7): qty 2.5

## 2012-01-11 MED ORDER — SODIUM CHLORIDE 0.9 % IV SOLN
1.0000 g | Freq: Once | INTRAVENOUS | Status: AC
  Administered 2012-01-11: 1 g via INTRAVENOUS
  Filled 2012-01-11: qty 10

## 2012-01-11 MED ORDER — IPRATROPIUM BROMIDE 0.02 % IN SOLN: 0.5000 mg | Freq: Three times a day (TID) | RESPIRATORY_TRACT | Status: DC

## 2012-01-11 MED ORDER — DEXTROSE 50 % IV SOLN
1.0000 | Freq: Once | INTRAVENOUS | Status: AC
  Administered 2012-01-11: 50 mL via INTRAVENOUS
  Filled 2012-01-11: qty 50

## 2012-01-11 MED ORDER — LORAZEPAM 2 MG/ML IJ SOLN
0.0000 mg | Freq: Four times a day (QID) | INTRAMUSCULAR | Status: DC
  Administered 2012-01-11: 2 mg via INTRAVENOUS
  Administered 2012-01-11: 4 mg via INTRAVENOUS
  Administered 2012-01-11 – 2012-01-12 (×2): 2 mg via INTRAVENOUS
  Filled 2012-01-11: qty 2

## 2012-01-11 MED ORDER — MORPHINE SULFATE 4 MG/ML IJ SOLN
INTRAMUSCULAR | Status: AC
  Administered 2012-01-11: 4 mg via INTRAVENOUS
  Filled 2012-01-11: qty 1

## 2012-01-11 MED ORDER — DEXTROSE 50 % IV SOLN
INTRAVENOUS | Status: AC
  Filled 2012-01-11: qty 50

## 2012-01-11 MED ORDER — INSULIN ASPART 100 UNIT/ML ~~LOC~~ SOLN: 5.0000 [IU] | Freq: Once | SUBCUTANEOUS | Status: DC

## 2012-01-11 MED ORDER — LORAZEPAM 2 MG/ML IJ SOLN
2.0000 mg | INTRAMUSCULAR | Status: DC
  Administered 2012-01-11 – 2012-01-13 (×8): 2 mg via INTRAVENOUS
  Filled 2012-01-11 (×11): qty 1

## 2012-01-11 MED ORDER — ALBUTEROL SULFATE (5 MG/ML) 0.5% IN NEBU
2.5000 mg | INHALATION_SOLUTION | Freq: Three times a day (TID) | RESPIRATORY_TRACT | Status: DC
  Administered 2012-01-12 – 2012-01-14 (×6): 2.5 mg via RESPIRATORY_TRACT
  Filled 2012-01-11 (×7): qty 0.5

## 2012-01-11 MED ORDER — TRAMADOL HCL 50 MG PO TABS
50.0000 mg | ORAL_TABLET | Freq: Once | ORAL | Status: AC
  Administered 2012-01-11: 50 mg via ORAL
  Filled 2012-01-11: qty 1

## 2012-01-11 MED ORDER — INSULIN REGULAR HUMAN 100 UNIT/ML IJ SOLN: 5.0000 [IU] | Freq: Once | INTRAMUSCULAR | Status: DC

## 2012-01-11 MED ORDER — HEPARIN SODIUM (PORCINE) 1000 UNIT/ML DIALYSIS
20.0000 [IU]/kg | INTRAMUSCULAR | Status: DC | PRN
  Administered 2012-01-13: 1500 [IU] via INTRAVENOUS_CENTRAL
  Filled 2012-01-11: qty 2

## 2012-01-11 MED ORDER — SODIUM POLYSTYRENE SULFONATE 15 GM/60ML PO SUSP
15.0000 g | Freq: Once | ORAL | Status: AC
  Administered 2012-01-11: 15 g via ORAL
  Filled 2012-01-11: qty 60

## 2012-01-11 MED ORDER — IPRATROPIUM BROMIDE 0.02 % IN SOLN: 0.5000 mg | Freq: Four times a day (QID) | RESPIRATORY_TRACT | Status: DC

## 2012-01-11 MED ORDER — PARICALCITOL 5 MCG/ML IV SOLN
1.0000 ug | INTRAVENOUS | Status: DC
  Administered 2012-01-13: 1 ug via INTRAVENOUS
  Filled 2012-01-11: qty 0.2

## 2012-01-11 MED ORDER — LORAZEPAM 2 MG/ML IJ SOLN: 0.0000 mg | Freq: Two times a day (BID) | INTRAMUSCULAR | Status: DC
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
## 2012-01-11 NOTE — ED Provider Notes (Signed)
Medical screening examination/treatment/procedure(s) were conducted as a shared visit with non-physician practitioner(s) and myself.  I personally evaluated the patient during the encounter Pt with ESRD who has missed dialysis for 4 days, alcoholic with ETOH intoxication presents with shortness of breath, exam suggests CHF, confirmed by lab tests.  Will need admission for dialysis.   Carleene Cooper III, MD 01/11/12 [phone]

## 2012-01-11 NOTE — Progress Notes (Signed)
Critical lab: Potassium 7.1. MD notified. Orders received.
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
## 2012-01-11 NOTE — Progress Notes (Signed)
Subjective: The patient is lying in bed having a breathing treatment when I enter. He states that he is feeling okay this morning but hurting a little in his belly. When asked, he states that he thinks he wants to eat this morning. Talked with him about missing dialysis and the implications of possibly dying and he seemed to understand that he could die from missing dialysis. He stated that he did not want to die and that he was on a binge. I spoke with him about binges having the ability to cause him to die and he seemed to understand and was clear that he wanted to continue with dialysis but stated that he does not like dialysis. He was concerned that the renal doctors would kick him out of the dialysis program. He thinks he might be able to go home today after dialysis. His breathing is okay but desaturates off oxygen. He thinks the breathing treatments help him. He denies SI/HI but does feel down about his life right now. No chest pain. Some SOB.   Objective: Vital signs in last 24 hours: Filed Vitals:   01/11/12 0213 01/11/12 0451 01/11/12 0922 01/11/12 0924  BP: 150/90 140/82  151/78  Pulse: 82 81  79  Temp: 98.6 F (37 C) 99.3 F (37.4 C)  99.1 F (37.3 C)  TempSrc: Oral Oral  Oral  Resp: 16 16  16   Height:      Weight:      SpO2: 96% 94% 94% 95%   Weight change:   Intake/Output Summary (Last 24 hours) at 01/11/12 1114 Last data filed at 01/11/12 0900  Gross per 24 hour  Intake      0 ml  Output    500 ml  Net   -500 ml   General: resting in bed, no acute distress, doing breathing treatment HEENT: PERRL, EOMI, no scleral icterus, laceration on nose  Cardiac: RRR, no rubs, murmurs or gallops  Pulm: clear to auscultation bilaterally, poor effort, no overt wheezing Abd: soft, tender to epigastric region, normoactive bowel sounds, no palpable masses/pulses  Ext: warm and well perfused, no pedal edema  Neuro: alert and oriented X3, cranial nerves II-XII grossly intact  Lab  Results: Basic Metabolic Panel:  Lab 01/11/12 1610 01/11/12 0553 01/10/12 2235 01/10/12 1725  NA -- 125* -- 123*  K -- 7.1* -- 5.9*  CL -- 85* -- 83*  CO2 -- 13* -- 17*  GLUCOSE 48* 57* -- --  BUN -- 90* -- 80*  CREATININE -- 11.27* -- 10.62*  CALCIUM -- 7.7* -- 7.9*  MG -- -- -- --  PHOS -- 13.5* 13.1* --   Liver Function Tests:  Lab 01/11/12 0553 01/10/12 1725  AST -- 28  ALT -- 18  ALKPHOS -- 102  BILITOT -- 0.3  PROT -- 7.5  ALBUMIN 2.9* 3.1*    Lab 01/10/12 1725  LIPASE 54  AMYLASE --   CBC:  Lab 01/11/12 0553 01/10/12 1725  WBC 13.4* 3.7*  NEUTROABS -- 1.9  HGB 11.4* 11.2*  HCT 33.4* 32.6*  MCV 95.2 93.9  PLT 140* 135*   CBG:  Lab 01/11/12 0816 01/11/12 0806  GLUCAP 69* 589*   Alcohol Level:  Lab 01/10/12 1725  ETH 230*   Studies/Results: Dg Chest 2 View  01/10/2012  *RADIOLOGY REPORT*  Clinical Data: Chest pain, shortness of breath, weakness  CHEST - 2 VIEW  Comparison: 12/19/2011  Findings: Cardiomegaly with pulmonary vascular congestion.  No frank interstitial edema. No pleural  effusion or pneumothorax.  Mild degenerative changes of the visualized thoracolumbar spine.  IMPRESSION: Cardiomegaly with pulmonary vascular congestion.  No frank interstitial edema.  Original Report Authenticated By: Charline Bills, M.D.   Medications: I have reviewed the patient's current medications. Scheduled Meds:   . albuterol  2.5 mg Nebulization Q6H   And  . ipratropium  0.5 mg Nebulization Q6H  . calcium gluconate  1 g Intravenous Once  . cloNIDine  0.3 mg Transdermal Weekly  . dextrose  1 ampule Intravenous Once  . dextrose      . folic acid  1 mg Oral Daily  . furosemide      . furosemide      . heparin  5,000 Units Subcutaneous Q8H  .  HYDROmorphone (DILAUDID) injection  1 mg Intravenous Once  . insulin aspart  5 Units Subcutaneous Once  . LORazepam  0-4 mg Intravenous Q6H   Followed by  . LORazepam  0-4 mg Intravenous Q12H  . metoprolol  5 mg  Intravenous Q8H  . multivitamin with minerals  1 tablet Oral Daily  . ondansetron  4 mg Intravenous Once  . pantoprazole (PROTONIX) IV  40 mg Intravenous Q24H  . paricalcitol  1 mcg Intravenous 3 times weekly  . sodium chloride  1,000 mL Intravenous Once  . sodium chloride  3 mL Intravenous Q12H  . sodium chloride  3 mL Intravenous Q12H  . sodium polystyrene  15 g Oral Once  . sodium polystyrene  60 g Oral Once  . thiamine  100 mg Oral Daily   Or  . thiamine  100 mg Intravenous Daily  . DISCONTD: insulin regular  5 Units Intravenous Once  . DISCONTD:  morphine injection  4 mg Intravenous Once  . DISCONTD: ondansetron  4 mg Intravenous Once   Continuous Infusions:  PRN Meds:.acetaminophen, acetaminophen, albuterol, heparin, ipratropium, LORazepam, LORazepam, morphine injection, ondansetron (ZOFRAN) IV, ondansetron, sodium chloride Assessment/Plan:  Abdominal pain, generalized - Pt does come in and out of the hospital with this problem and is likely related to alcoholic gastritis or acute on chronic pancreatitis. Is getting protonix IV daily which may be helping and would like to eat this morning. If doing well after dialysis may be stable for discharge today.  Hyperkalemia - The patient was admitted with K of 5.9 from missing dialysis and elevated to 7.1 this AM and calcium gluconate and insulin with D50 was given. Did call renal and they will do dialysis this morning.   Pancytopenia - Likely due to his alcoholism. WBC does trend up and down in the past. Has been high in the past and will check after dialysis.   HYPERTENSION, ESSENTIAL - Pt is on metoprolol and lasix and clonidine patch. Blood pressure are somewhat elevated due to his medication not being restarted and will be restarted at admission post dialysis due to chance to increase potassium.      End stage renal disease on dialysis - Came in having missed two dialysis sessions due to binge drinking and will receive dialysis this  morning. Does have electrolyte abnormalities from missing dialysis such as hyperkalemia, AG.     Alcohol abuse - The patient was on binge causing him to miss dialysis. I did advise him that this habit could be lethal and he seemed to want to stop.    Protein malnutrition - Pt on renal diet as of this morning.   Disposition - Pt needs dialysis this morning and may be stable for discharge tomorrow morning.  LOS: 1 day   Genella Mech 01/11/2012, 11:14 AM
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
## 2012-01-11 NOTE — Progress Notes (Signed)
Hypoglycemic event: CBG at 8:06 read 589 on the glucometer. When I rechecked it at 8:16, it read 69 on the glucometer. A STAT glucose serum level was drawn to determine accurate level, and it resulted as 48. D50 was then given IV. At 10:49, his CBG was 161. MD notified.

## 2012-01-12 LAB — CBC
Hemoglobin: 11.1 g/dL — ABNORMAL LOW (ref 13.0–17.0)
MCHC: 33.6 g/dL (ref 30.0–36.0)
MCV: 97.7 fL (ref 78.0–100.0)
Platelets: 80 10*3/uL — ABNORMAL LOW (ref 150–400)
RBC: 3.39 MIL/uL — ABNORMAL LOW (ref 4.22–5.81)
RBC: 3.48 MIL/uL — ABNORMAL LOW (ref 4.22–5.81)
WBC: 9.1 10*3/uL (ref 4.0–10.5)

## 2012-01-12 LAB — RENAL FUNCTION PANEL
Albumin: 2.9 g/dL — ABNORMAL LOW (ref 3.5–5.2)
Albumin: 3 g/dL — ABNORMAL LOW (ref 3.5–5.2)
BUN: 90 mg/dL — ABNORMAL HIGH (ref 6–23)
Chloride: 96 mEq/L (ref 96–112)
GFR calc Af Amer: 10 mL/min — ABNORMAL LOW (ref >90)
GFR calc non Af Amer: 8 mL/min — ABNORMAL LOW (ref >90)
Phosphorus: 13.5 mg/dL — ABNORMAL HIGH (ref 2.3–4.6)
Phosphorus: 9.1 mg/dL — ABNORMAL HIGH (ref 2.3–4.6)
Potassium: 7.1 mEq/L (ref 3.5–5.1)
Potassium: 3.5 mEq/L (ref 3.5–5.1)
Sodium: 125 mEq/L — ABNORMAL LOW (ref 135–145)
Sodium: 139 mEq/L (ref 135–145)

## 2012-01-12 LAB — BASIC METABOLIC PANEL
CO2: 25 mEq/L (ref 19–32)
Chloride: 97 mEq/L (ref 96–112)
Creatinine, Ser: 6.9 mg/dL — ABNORMAL HIGH (ref 0.50–1.35)

## 2012-01-12 MED ORDER — LORAZEPAM 2 MG/ML IJ SOLN
0.0000 mg | INTRAMUSCULAR | Status: DC
  Administered 2012-01-12 (×2): 2 mg via INTRAVENOUS
  Filled 2012-01-12 (×2): qty 1

## 2012-01-12 MED ORDER — LORAZEPAM 2 MG/ML IJ SOLN: 0.0000 mg | Freq: Two times a day (BID) | INTRAMUSCULAR | Status: DC

## 2012-01-12 MED ORDER — FOLIC ACID 1 MG PO TABS: 1.0000 mg | ORAL_TABLET | Freq: Every day | ORAL | Status: DC

## 2012-01-12 MED ORDER — HEPARIN SODIUM (PORCINE) 1000 UNIT/ML DIALYSIS: 20.0000 [IU]/kg | INTRAMUSCULAR | Status: DC | PRN

## 2012-01-12 MED ORDER — VITAMIN B-1 100 MG PO TABS: 100.0000 mg | ORAL_TABLET | Freq: Every day | ORAL | Status: DC

## 2012-01-12 MED ORDER — METOPROLOL TARTRATE 1 MG/ML IV SOLN
5.0000 mg | Freq: Once | INTRAVENOUS | Status: AC
  Administered 2012-01-12: 5 mg via INTRAVENOUS
  Filled 2012-01-12: qty 5

## 2012-01-12 MED ORDER — THIAMINE HCL 100 MG/ML IJ SOLN: 100.0000 mg | Freq: Every day | INTRAMUSCULAR | Status: DC

## 2012-01-12 MED ORDER — LORAZEPAM 1 MG PO TABS
1.0000 mg | ORAL_TABLET | Freq: Four times a day (QID) | ORAL | Status: DC | PRN
  Administered 2012-01-12 – 2012-01-14 (×3): 1 mg via ORAL
  Filled 2012-01-12 (×3): qty 1

## 2012-01-12 MED ORDER — ADULT MULTIVITAMIN W/MINERALS CH: 1.0000 | ORAL_TABLET | Freq: Every day | ORAL | Status: DC

## 2012-01-12 MED ORDER — LORAZEPAM 2 MG/ML IJ SOLN
1.0000 mg | Freq: Four times a day (QID) | INTRAMUSCULAR | Status: DC | PRN
  Administered 2012-01-13: 1 mg via INTRAVENOUS
  Administered 2012-01-13: 08:00:00 via INTRAVENOUS
  Administered 2012-01-13: 1 mg via INTRAVENOUS
  Filled 2012-01-12 (×2): qty 1

## 2012-01-12 MED ORDER — LORAZEPAM 2 MG/ML IJ SOLN
0.0000 mg | INTRAMUSCULAR | Status: DC
  Administered 2012-01-12: 4 mg via INTRAMUSCULAR

## 2012-01-12 MED ORDER — LORAZEPAM 2 MG/ML IJ SOLN
0.0000 mg | INTRAMUSCULAR | Status: DC | PRN
  Administered 2012-01-12 (×2): 4 mg via INTRAVENOUS
  Filled 2012-01-12 (×2): qty 2

## 2012-01-12 NOTE — Progress Notes (Signed)
Patient continues to become increasingly agitated.  His CIWA score remains above a 28.  His most recent blood pressure is 164/100.  Per MD, CIWA are now Q 1 hour.  Per MD, ok to transfer to stepdown.  Leron Stoffers, Justine Null

## 2012-01-12 NOTE — Progress Notes (Addendum)
Subjective: He appears uncomfortable- reports having some left quadrant abdominal pain, nausea. Nurse denies noticing any vomiting in her shift. He also reports having headaches." I feel sick". " I might die if I don not quit drinking or miss HD".  Objective: Vital signs in last 24 hours: Filed Vitals:   01/12/12 0128 01/12/12 0409 01/12/12 0523 01/12/12 0643  BP: 171/93 186/102 164/100 168/91  Pulse: 79 86 85 80  Temp: 98.8 F (37.1 C) 97.9 F (36.6 C) 97.8 F (36.6 C) 98.4 F (36.9 C)  TempSrc: Oral Oral Oral Oral  Resp: 18 19 20 15   Height:    6' (1.829 m)  Weight:    155 lb 6.8 oz (70.5 kg)  SpO2: 93% 93% 92% 95%   Weight change: -1 lb 15.7 oz (-0.9 kg)  Intake/Output Summary (Last 24 hours) at 01/12/12 0726 Last data filed at 01/12/12 0015  Gross per 24 hour  Intake    200 ml  Output   4250 ml  Net  -4050 ml   General: resting in bed, no acute distress, doing breathing treatment HEENT: PERRL, EOMI, no scleral icterus, laceration on nose  Cardiac: RRR, no rubs, murmurs or gallops  Pulm: clear to auscultation bilaterally, poor effort, no overt wheezing Abd: soft, tender to epigastric region, normoactive bowel sounds, no palpable masses/pulses  Ext: warm and well perfused, no pedal edema  Neuro: alert and oriented X3, cranial nerves II-XII grossly intact  Lab Results: Basic Metabolic Panel:  Lab 01/11/12 1610 01/11/12 0821 01/11/12 0553  NA 140 -- 125*  K 3.5 -- 7.1*  CL 97 -- 85*  CO2 26 -- 13*  GLUCOSE 117* 48* --  BUN 28* -- 90*  CREATININE 5.42* -- 11.27*  CALCIUM 8.9 -- 7.7*  MG -- -- --  PHOS 7.8* -- 13.5*   Liver Function Tests:  Lab 01/11/12 1839 01/11/12 0553 01/10/12 1725  AST -- -- 28  ALT -- -- 18  ALKPHOS -- -- 102  BILITOT -- -- 0.3  PROT -- -- 7.5  ALBUMIN 3.2* 2.9* --    Lab 01/10/12 1725  LIPASE 54  AMYLASE --   CBC:  Lab 01/12/12 0615 01/11/12 0553 01/10/12 1725  WBC 10.3 13.4* --  NEUTROABS -- -- 1.9  HGB 11.1* 11.4* --    HCT 33.0* 33.4* --  MCV 97.3 95.2 --  PLT PENDING 140* --   CBG:  Lab 01/11/12 1049 01/11/12 0816 01/11/12 0806  GLUCAP 161* 69* 589*   Alcohol Level:  Lab 01/10/12 1725  ETH 230*   Studies/Results: Dg Chest 2 View  01/10/2012  *RADIOLOGY REPORT*  Clinical Data: Chest pain, shortness of breath, weakness  CHEST - 2 VIEW  Comparison: 12/19/2011  Findings: Cardiomegaly with pulmonary vascular congestion.  No frank interstitial edema. No pleural effusion or pneumothorax.  Mild degenerative changes of the visualized thoracolumbar spine.  IMPRESSION: Cardiomegaly with pulmonary vascular congestion.  No frank interstitial edema.  Original Report Authenticated By: Charline Bills, M.D.   Medications: I have reviewed the patient's current medications. Scheduled Meds:    . ipratropium  0.5 mg Nebulization TID   And  . albuterol  2.5 mg Nebulization TID  . calcium gluconate  1 g Intravenous Once  . cloNIDine  0.3 mg Transdermal Weekly  . dextrose  1 ampule Intravenous Once  . folic acid  1 mg Oral Daily  . heparin  5,000 Units Subcutaneous Q8H  . insulin aspart  5 Units Subcutaneous Once  . LORazepam  0-4 mg Intravenous Q1H   Followed by  . LORazepam  0-4 mg Intravenous Q12H  . LORazepam  2 mg Intravenous Q4H  . metoprolol  5 mg Intravenous Q8H  . metoprolol  5 mg Intravenous Once  . multivitamin with minerals  1 tablet Oral Daily  . pantoprazole (PROTONIX) IV  40 mg Intravenous Q24H  . paricalcitol  1 mcg Intravenous 3 times weekly  . sodium chloride  3 mL Intravenous Q12H  . sodium chloride  3 mL Intravenous Q12H  . sodium polystyrene  15 g Oral Once  . thiamine  100 mg Oral Daily   Or  . thiamine  100 mg Intravenous Daily  . traMADol  50 mg Oral Once  . DISCONTD: albuterol  2.5 mg Nebulization Q6H  . DISCONTD: albuterol  2.5 mg Nebulization TID  . DISCONTD: albuterol  2.5 mg Nebulization TID  . DISCONTD: insulin regular  5 Units Intravenous Once  . DISCONTD: ipratropium   0.5 mg Nebulization Q6H  . DISCONTD: ipratropium  0.5 mg Nebulization Q6H  . DISCONTD: ipratropium  0.5 mg Nebulization TID  . DISCONTD: LORazepam  0-4 mg Intravenous Q6H  . DISCONTD: LORazepam  0-4 mg Intravenous Q12H  . DISCONTD: LORazepam  0-4 mg Intravenous Q12H   Continuous Infusions:  PRN Meds:.acetaminophen, acetaminophen, albuterol, heparin, ipratropium, morphine injection, ondansetron (ZOFRAN) IV, ondansetron, sodium chloride, DISCONTD: LORazepam, DISCONTD: LORazepam, DISCONTD: LORazepam Assessment/Plan:  1.Abdominal pain, generalized - Pt does come in and out of the hospital with this problem and is likely related to alcoholic gastritis or acute on chronic pancreatitis. -NPO( also given the risk of aspiration with alcohol withdrawal ) -IV protonix  2. Alcohol withdrawal: The patient was on binge causing him to miss dialysis. His blood alcohol level  was 230 on admission. He started having symptoms of withdrawal last night  including headache, HTN, Shaking, agitation with  CIWA score of 27 , due to  which he was transferred to SDU.  He has received 12 mg of ativan since yesterday. Still very agitated. -Continue Ativan per CIWA  3. Hyperkalemia - Resolved. K- 3.5 , HD yesterday.The patient was admitted with K of 5.9 from missing dialysis and elevated to 7.1 this AM and calcium gluconate and insulin with D50 was given.   4. Pancytopenia - Likely due to his alcoholism. WBC does trend up and down in the past. Has been high in the past and will check after dialysis.  WBC - 10.3 today.   5. HYPERTENSION, ESSENTIAL - BP elevated secondary to withdrawal . He is on clonidine and fosinopril at home. Fosinopril was held with hyperkalemia.Metoprolol IV was started during this admission with elevated BP  - Restart fosinopril  in AM if his K continues to be WNL. - continue Metoprolol for now.    6. End stage renal disease on dialysis - Came in having missed two dialysis sessions due to binge  drinking and will receive dialysis this morning. Does have electrolyte abnormalities from missing dialysis such as hyperkalemia, AG. S/p HD yesterday     7.Alcohol abuse -  I did advise him that this habit could be lethal and he seemed to want to stop.     Disposition - Continue to monitor in step down unit.   LOS: 2 days   SAWHNEY,MEGHA 01/12/2012, 7:26 AM  Internal Medicine Teaching Service Attending Note Date: 01/12/2012  Patient name: [name]  Medical record number: [id number]  Date of birth: Mar 27, 1956    This patient has  been seen and discussed with the house staff. Please see their note for complete details. I concur with their findings with the following additions/corrections: Patient being treated for Etoh withdrawal. Patient can be complicated do to his drug-seeking behavior bed to exists with his actual genuine alcohol withdrawal. When I examined him this afternoon he was somnolent but when questioned about whether or not he was going into withdrawal he began to then apparently voluntarily shaking his right hand. I would be very careful with over sedation in this patient. We will watch him closely in the step down unit.  Paulette Blanch Dam 01/12/2012, 1:54 PM

## 2012-01-12 NOTE — Progress Notes (Signed)
Pt continues to require Ativan Q hour. Pt IV access was lost, obtained order for IM ativan admin. Will continue to monitor and maintain dialogue with DR. Sitter bedside for safety.

## 2012-01-12 NOTE — Progress Notes (Signed)
Patient's CIWA scores are consistently elevated:  @ 2024 - CIWA 35, @ 0011 - CIWA 28, @0233  - CIWA 31.  Rapid Response and MD called after the 2024 CIWA.  Both came and assessed the patient and it was determined that patient was appropriate for the floor.  Patient moved to Camera Room due to increasing agitation.  MD called again and made aware of CIWA score and continuing elevated blood pressures.  One time dose of IV Metoprolol 5 mg received and medication given.  RN verified that Clonidine patch still in place on patient's right shoulder.  Will continue to monitor patient.  Stacie Glaze  01/12/2012

## 2012-01-12 NOTE — Progress Notes (Signed)
Pt transferred from 6700; CIWA on transfer 27; pt alert to self and place; will continue to monitor

## 2012-01-12 NOTE — Progress Notes (Signed)
Subjective:   Severe alcohol withdrawal overnight, with Posey belt and mitts, no new complaints.  Objective: Vital signs in last 24 hours: Temp:  [97.3 F (36.3 C)-99.1 F (37.3 C)] 98.8 F (37.1 C) (06/09 0734) Pulse Rate:  [79-100] 80  (06/09 0643) Resp:  [12-20] 15  (06/09 0643) BP: (128-200)/(62-103) 168/91 mmHg (06/09 0643) SpO2:  [92 %-96 %] 95 % (06/09 0643) Weight:  [69.6 kg (153 lb 7 oz)-74.7 kg (164 lb 10.9 oz)] 70.5 kg (155 lb 6.8 oz) (06/09 0643) Weight change: -0.9 kg (-1 lb 15.7 oz)  Intake/Output from previous day: 06/08 0701 - 06/09 0700 In: 200 [IV Piggyback:120] Out: 4250 [Urine:150]   EXAM: General appearance:  Alert and oriented, but in mild distress Resp:  CTA bilaterally Cardio:  RRR without murmur GI:  + BS, soft and nontender Extremities:  No edema Access:  AVG @ LUA with + bruit  Lab Results:  Basename 01/12/12 0615 01/11/12 0553  WBC 10.3 13.4*  HGB 11.1* 11.4*  HCT 33.0* 33.4*  PLT 90* 140*   BMET:  Basename 01/12/12 0615 01/11/12 1839  NA 139 140  K 3.5 3.5  CL 96 97  CO2 25 26  GLUCOSE 108* 117*  BUN 35* 28*  CREATININE 6.68* 5.42*  CALCIUM 8.9 8.9  ALBUMIN 3.0* 3.2*   No results found for this basename: PTH:2 in the last 72 hours Iron Studies: No results found for this basename: IRON,TIBC,TRANSFERRIN,FERRITIN in the last 72 hours  Assessment/Plan: 1. Epigastric pain/nausea and vomiting - secondary to gastritis/acute-on-chronic pancreatitis due to alcoholism and binge drinking; better on IV Protonix.  2. ESRD - HD on MWF at [facility]; last HD on 6/3 until yesterday. Next HD tomorrow on regular schedule.  3. Hyperkalemia - secondary to missed HD; K 7.1 yesterday, received calcium gluconate, insulin, and D50 in ED, then HD with 1K bath for 1 hr, then 2K; post-HD K 3.5.  4. Hypertension/volume - BP most recently 168/91 on Clonidine 0.3 mg patch weekly and Metoprolol IV (outpatient meds are Clonidine 0.3 mg bid and Fosinopril 40 mg qd);  post-HD wt yesterday 70.5 kg s/p net UF of 4.1 L (EDW of 70.5 kg).  5. Anemia - Hgb 11.1 on Epogen 4200 U. Hold today.  6. Metabolic bone disease - Ca 8.9, last P 9.1, on Fosrenol 1 g and Phoslo 3 with meals, Phoslo 1 with snacks, also Zemplar.  7. Nutrition - last Alb 3.  8. Alcohol abuse - level of 230 on admission, repeated binges, but indicates desire to stop; currently with severe withdrawal (on IV Ativan, morphine).   LOS: 2 days   LYLES,CHARLES 01/12/2012,8:41 AM  Patient seen and examined and agree with assessment and plan as above.  Vinson Moselle  MD [facility] [phone] pgr    [phone] cell 01/12/2012, 1:00 PM

## 2012-01-13 ENCOUNTER — Observation Stay (HOSPITAL_COMMUNITY): Payer: Medicare Other

## 2012-01-13 LAB — CBC
Hemoglobin: 10.2 g/dL — ABNORMAL LOW (ref 13.0–17.0)
MCH: 32.7 pg (ref 26.0–34.0)
Platelets: 77 10*3/uL — ABNORMAL LOW (ref 150–400)
RBC: 3.12 MIL/uL — ABNORMAL LOW (ref 4.22–5.81)
WBC: 4.8 10*3/uL (ref 4.0–10.5)

## 2012-01-13 LAB — BASIC METABOLIC PANEL
Calcium: 9.1 mg/dL (ref 8.4–10.5)
GFR calc non Af Amer: >90 mL/min (ref >90)
Glucose, Bld: 121 mg/dL — ABNORMAL HIGH (ref 70–99)
Potassium: 3.1 mEq/L — ABNORMAL LOW (ref 3.5–5.1)
Sodium: 138 mEq/L (ref 135–145)

## 2012-01-13 LAB — RENAL FUNCTION PANEL
CO2: 19 mEq/L (ref 19–32)
Calcium: 9 mg/dL (ref 8.4–10.5)
Chloride: 95 mEq/L — ABNORMAL LOW (ref 96–112)
GFR calc Af Amer: 6 mL/min — ABNORMAL LOW (ref >90)
GFR calc non Af Amer: 6 mL/min — ABNORMAL LOW (ref >90)
Glucose, Bld: 69 mg/dL — ABNORMAL LOW (ref 70–99)
Potassium: 3.5 mEq/L (ref 3.5–5.1)
Sodium: 139 mEq/L (ref 135–145)

## 2012-01-13 MED ORDER — HYDROCODONE-ACETAMINOPHEN 5-325 MG PO TABS
ORAL_TABLET | ORAL | Status: AC
  Filled 2012-01-13: qty 1

## 2012-01-13 MED ORDER — LISINOPRIL 10 MG PO TABS
10.0000 mg | ORAL_TABLET | Freq: Every day | ORAL | Status: DC
  Administered 2012-01-13: 10 mg via ORAL
  Filled 2012-01-13 (×3): qty 1

## 2012-01-13 MED ORDER — PARICALCITOL 5 MCG/ML IV SOLN
INTRAVENOUS | Status: AC
  Administered 2012-01-13: 1 ug via INTRAVENOUS
  Filled 2012-01-13: qty 1

## 2012-01-13 MED ORDER — LISINOPRIL 10 MG PO TABS
10.0000 mg | ORAL_TABLET | Freq: Every day | ORAL | Status: DC
  Filled 2012-01-13: qty 1

## 2012-01-13 MED ORDER — ACETAMINOPHEN 650 MG RE SUPP: 650.0000 mg | Freq: Four times a day (QID) | RECTAL | Status: DC | PRN

## 2012-01-13 MED ORDER — PARICALCITOL 5 MCG/ML IV SOLN: 1.0000 ug | INTRAVENOUS | Status: DC

## 2012-01-13 MED ORDER — HYDRALAZINE HCL 20 MG/ML IJ SOLN
10.0000 mg | Freq: Four times a day (QID) | INTRAMUSCULAR | Status: DC | PRN
  Administered 2012-01-13 – 2012-01-14 (×2): 10 mg via INTRAVENOUS
  Filled 2012-01-13 (×2): qty 0.5

## 2012-01-13 MED ORDER — FOSINOPRIL SODIUM 10 MG PO TABS: 10.0000 mg | ORAL_TABLET | Freq: Every day | ORAL | Status: DC

## 2012-01-13 MED ORDER — BOOST / RESOURCE BREEZE PO LIQD
1.0000 | Freq: Two times a day (BID) | ORAL | Status: DC
  Administered 2012-01-13 – 2012-01-14 (×3): 1 via ORAL

## 2012-01-13 MED ORDER — HYDROCODONE-ACETAMINOPHEN 7.5-325 MG PO TABS
1.0000 | ORAL_TABLET | ORAL | Status: DC | PRN
  Administered 2012-01-13 – 2012-01-14 (×4): 1 via ORAL
  Filled 2012-01-13 (×5): qty 1

## 2012-01-13 MED ORDER — LORAZEPAM 2 MG/ML IJ SOLN
INTRAMUSCULAR | Status: AC
  Administered 2012-01-13: 1 mg via INTRAVENOUS
  Filled 2012-01-13: qty 1

## 2012-01-13 MED ORDER — ACETAMINOPHEN 325 MG PO TABS: 650.0000 mg | ORAL_TABLET | Freq: Four times a day (QID) | ORAL | Status: DC | PRN

## 2012-01-13 NOTE — Progress Notes (Signed)
Late entry: Pt went to HD this am, then TX to 6715, called report.

## 2012-01-13 NOTE — Progress Notes (Signed)
Hemodialysis-See flowsheet Pt transferred from HD to 6700, VS stable. Ativan given pre transport, pt has been very anxious/restless, reported to primary RN.

## 2012-01-13 NOTE — Progress Notes (Signed)
Subjective: He appears sleepy but arousable. Getting dialysis when I enter and does not appear to be in withdrawal. Does have medical restraints and belt on from the previous evening. Not trying to pull at any lines or move around. States his throat is dry and hurts. Would like to drink some liquids. Still having some abdominal pain. No chest pain, SOB, nausea, vomiting, diarrhea.   Objective: Vital signs in last 24 hours: Filed Vitals:   01/13/12 0744 01/13/12 0855 01/13/12 0859 01/13/12 0930  BP: 189/116 167/102 174/96   Pulse:  104 98 104  Temp: 99.4 F (37.4 C) 100.9 F (38.3 C)    TempSrc: Oral Oral    Resp:  17  17  Height:      Weight:  153 lb 3.5 oz (69.5 kg)    SpO2: 96% 96%     Weight change: -12 lb 2 oz (-5.5 kg) No intake or output data in the 24 hours ending 01/13/12 0953 General: resting in bed, no acute distress, at dialysis HEENT: PERRL, EOMI, no scleral icterus, laceration on nose  Cardiac: RRR, no rubs, murmurs or gallops  Pulm: clear to auscultation bilaterally, poor effort, no overt wheezing Abd: soft, tender to epigastric region, normoactive bowel sounds, no palpable masses/pulses  Ext: warm and well perfused, no pedal edema  Neuro: alert and oriented X3, cranial nerves II-XII grossly intact  Lab Results: Basic Metabolic Panel:  Lab 01/13/12 1610 01/12/12 0900 01/12/12 0615 01/11/12 1839  NA 138 138 -- --  K 3.1* 3.6 -- --  CL 104 97 -- --  CO2 21 25 -- --  GLUCOSE 121* 101* -- --  BUN 17 36* -- --  CREATININE 0.76 6.90* -- --  CALCIUM 9.1 9.1 -- --  MG -- -- -- --  PHOS -- -- 9.1* 7.8*   Liver Function Tests:  Lab 01/12/12 0615 01/11/12 1839 01/10/12 1725  AST -- -- 28  ALT -- -- 18  ALKPHOS -- -- 102  BILITOT -- -- 0.3  PROT -- -- 7.5  ALBUMIN 3.0* 3.2* --    Lab 01/10/12 1725  LIPASE 54  AMYLASE --   CBC:  Lab 01/13/12 0917 01/12/12 0900 01/10/12 1725  WBC 4.8 9.1 --  NEUTROABS -- -- 1.9  HGB 10.2* 11.4* --  HCT 31.2* 34.0* --    MCV 100.0 97.7 --  PLT 77* 80* --   CBG:  Lab 01/11/12 1049 01/11/12 0816 01/11/12 0806  GLUCAP 161* 69* 589*   Alcohol Level:  Lab 01/10/12 1725  ETH 230*   Studies/Results: No results found. Medications: I have reviewed the patient's current medications. Scheduled Meds:    . ipratropium  0.5 mg Nebulization TID   And  . albuterol  2.5 mg Nebulization TID  . cloNIDine  0.3 mg Transdermal Weekly  . folic acid  1 mg Oral Daily  . heparin  5,000 Units Subcutaneous Q8H  . insulin aspart  5 Units Subcutaneous Once  . lisinopril  10 mg Oral QHS  . metoprolol  5 mg Intravenous Q8H  . multivitamin with minerals  1 tablet Oral Daily  . pantoprazole (PROTONIX) IV  40 mg Intravenous Q24H  . paricalcitol  1 mcg Intravenous 3 times weekly  . sodium chloride  3 mL Intravenous Q12H  . sodium chloride  3 mL Intravenous Q12H  . thiamine  100 mg Oral Daily   Or  . thiamine  100 mg Intravenous Daily  . DISCONTD: folic acid  1 mg Oral  Daily  . DISCONTD: fosinopril  10 mg Oral QHS  . DISCONTD: lisinopril  10 mg Oral QHS  . DISCONTD: LORazepam  0-4 mg Intravenous Q1H  . DISCONTD: LORazepam  0-4 mg Intravenous Q12H  . DISCONTD: LORazepam  0-4 mg Intramuscular Q1H  . DISCONTD: LORazepam  0-4 mg Intramuscular Q12H  . DISCONTD: LORazepam  2 mg Intravenous Q4H  . DISCONTD: multivitamin with minerals  1 tablet Oral Daily  . DISCONTD: thiamine  100 mg Intravenous Daily  . DISCONTD: thiamine  100 mg Oral Daily   Continuous Infusions:  PRN Meds:.acetaminophen, acetaminophen, albuterol, heparin, heparin, HYDROcodone-acetaminophen, ipratropium, LORazepam, LORazepam, ondansetron, sodium chloride, DISCONTD:  morphine injection, DISCONTD: ondansetron (ZOFRAN) IV Assessment/Plan:  1.Abdominal pain, generalized - Pt does come in and out of the hospital with this problem and is likely related to alcoholic gastritis or acute on chronic pancreatitis. -Clear liquids as more awake today -IV  protonix  2. Alcohol withdrawal: The patient was on binge causing him to miss dialysis. His blood alcohol level  was 230 on admission. He started having symptoms of withdrawal Saturday which seem to have been treated to oversedation and stopping scheduled ativan today and transfer from SDU to med surg. This patient does tend to exaggerate his symptoms in past hospitalizations and this stay to get more ativan. -Continue Ativan PRN per CIWA  3. Hyperkalemia - Resolved. K- 3.5 , HD today.The patient was admitted elevated K from missing dialysis and normalized with dialysis.   4. Pancytopenia - Likely due to his alcoholism. WBC does trend up and down in the past. Has been high in the past and will check after dialysis.  WBC - 4.8 today.   5. HYPERTENSION, ESSENTIAL - BP elevated secondary to withdrawal . He is on clonidine and fosinopril at home. Fosinopril restarted today.Metoprolol IV was started during this admission with elevated BP  - Fosinopril today - continue Metoprolol for now.    6. End stage renal disease on dialysis - Came in having missed two dialysis sessions due to binge drinking. Has had two dialysis sessions since. Normally MWF.    7.Alcohol abuse -  I did advise him that this habit could be lethal and he seemed to want to stop.    Disposition - Clear liquids and med surgery bed today. Possible D/C tomorrow.   LOS: 3 days   Genella Mech 01/13/2012, 9:53 AM

## 2012-01-13 NOTE — Progress Notes (Signed)
Pt arrived from [facility] as a transfer from 3300. Pt is Alert to self only and confused. Pt has scattered scabs on legs, arms, and nose. Pt came with sitter at bedside. Will continue to monitor.

## 2012-01-13 NOTE — Progress Notes (Signed)
Covering Clinical Child psychotherapist (CSW) attempted to visit pt room to complete substance abuse referral however pt not in the room appears to be in dialysis.   Full psychosocial to follow.  Theresia Bough, MSW, Theresia Majors [phone]

## 2012-01-13 NOTE — Progress Notes (Signed)
Utilization review completed.  
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
## 2012-01-13 NOTE — Progress Notes (Signed)
Spoke with Dr.Thomas about pt BP being 182/90 and not having any prn BP medications ordered. MD said that she would review his medications and put an order in.

## 2012-01-13 NOTE — Care Management Note (Signed)
    Page 1 of 1   01/13/2012     10:20:34 AM   CARE MANAGEMENT NOTE 01/13/2012  Patient:  [name], [name]   Account Number:  [id number]  Date Initiated:  01/13/2012  Documentation initiated by:  Donn Pierini  Subjective/Objective Assessment:   Pt admitted with abdominal pain, hx HD     Action/Plan:   PTA pt lived at home with children, independent   Anticipated DC Date:  01/15/2012   Anticipated DC Plan:  HOME/SELF CARE      DC Planning Services  CM consult      Choice offered to / List presented to:             Status of service:  In process, will continue to follow Medicare Important Message given?   (If response is "NO", the following Medicare IM given date fields will be blank) Date Medicare IM given:   Date Additional Medicare IM given:    Discharge Disposition:    Per UR Regulation:  Reviewed for med. necessity/level of care/duration of stay  If discussed at Long Length of Stay Meetings, dates discussed:    Comments:  PCP- Sidhu  01/13/12- 1015- Donn Pierini RN, BSN [phone] UR completed, pt in HD this am, to tx out of SD today. NCM to follow.

## 2012-01-14 MED ORDER — HEPARIN SODIUM (PORCINE) 1000 UNIT/ML DIALYSIS: 20.0000 [IU]/kg | INTRAMUSCULAR | Status: DC | PRN

## 2012-01-14 MED ORDER — METOPROLOL TARTRATE 1 MG/ML IV SOLN
5.0000 mg | Freq: Three times a day (TID) | INTRAVENOUS | Status: DC
  Administered 2012-01-14: 5 mg via INTRAVENOUS
  Filled 2012-01-14 (×3): qty 5

## 2012-01-14 MED ORDER — LORAZEPAM 0.5 MG PO TABS: 0.5000 mg | ORAL_TABLET | Freq: Four times a day (QID) | ORAL | Status: DC | PRN

## 2012-01-14 MED ORDER — LISINOPRIL 20 MG PO TABS
20.0000 mg | ORAL_TABLET | Freq: Every day | ORAL | Status: DC
  Filled 2012-01-14: qty 1

## 2012-01-14 MED ORDER — AMLODIPINE BESYLATE 10 MG PO TABS
10.0000 mg | ORAL_TABLET | Freq: Every day | ORAL | Status: DC
  Administered 2012-01-14: 10 mg via ORAL
  Filled 2012-01-14: qty 1

## 2012-01-14 MED ORDER — PANTOPRAZOLE SODIUM 40 MG PO TBEC
40.0000 mg | DELAYED_RELEASE_TABLET | Freq: Every day | ORAL | Status: DC
  Administered 2012-01-14: 40 mg via ORAL
  Filled 2012-01-14: qty 1

## 2012-01-14 MED ORDER — CLONIDINE HCL 0.3 MG PO TABS
0.3000 mg | ORAL_TABLET | Freq: Two times a day (BID) | ORAL | Status: DC
  Administered 2012-01-14: 0.3 mg via ORAL
  Filled 2012-01-14 (×2): qty 1

## 2012-01-14 MED ORDER — LORAZEPAM 2 MG/ML IJ SOLN: 0.5000 mg | Freq: Four times a day (QID) | INTRAMUSCULAR | Status: DC | PRN

## 2012-01-14 NOTE — Discharge Instructions (Signed)
You were in the hospital because you were drinking and missed dialysis. This is very dangerous and you should not miss dialysis. You have dialysis Monday, Wednesday, and Fridays and it is very important that you go. Please call a doctor or seek medical advice for fevers or shortness of breath.
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
## 2012-01-14 NOTE — Progress Notes (Signed)
Subjective:  Somnolent, no current complaints  Objective: Vital signs in last 24 hours: Temp:  [97.8 F (36.6 C)-99.7 F (37.6 C)] 97.8 F (36.6 C) (06/11 0835) Pulse Rate:  [86-104] 86  (06/11 0835) Resp:  [15-25] 22  (06/11 0835) BP: (151-204)/(88-125) 171/88 mmHg (06/11 0835) SpO2:  [90 %-98 %] 98 % (06/11 0835) Weight:  [67.9 kg (149 lb 11.1 oz)] 67.9 kg (149 lb 11.1 oz) (06/10 1300) Weight change: 0.3 kg (10.6 oz)  Intake/Output from previous day: 06/10 0701 - 06/11 0700 In: 363 [P.O.:360; I.V.:3] Out: 1662    EXAM: General appearance:  Alert, in no apparent distress Resp:  CTA bilaterally  Cardio:  RRR without murmur GI:  + BS, soft and nontender Extremities:  No edema  Access:  AVG @ LUA with + bruit  Lab Results:  Basename 01/13/12 0917 01/12/12 0900  WBC 4.8 9.1  HGB 10.2* 11.4*  HCT 31.2* 34.0*  PLT 77* 80*   BMET:  Basename 01/13/12 0918 01/13/12 0421 01/12/12 0615  NA 139 138 --  K 3.5 3.1* --  CL 95* 104 --  CO2 19 21 --  GLUCOSE 69* 121* --  BUN 49* 17 --  CREATININE 9.29* 0.76 --  CALCIUM 9.0 9.1 --  ALBUMIN 3.0* -- 3.0*   No results found for this basename: PTH:2 in the last 72 hours Iron Studies: No results found for this basename: IRON,TIBC,TRANSFERRIN,FERRITIN in the last 72 hours  Assessment/Plan: 1. Epigastric pain/nausea and vomiting - secondary to gastritis/acute-on-chronic pancreatitis due to alcoholism and binge drinking; on IV Protonix and clear liquids.  2. ESRD - HD on MWF at [facility]; last K 3.5. Next HD tomorrow.  3. Hyperkalemia - secondary to missed HD; K 7.1 on admission, received calcium gluconate, insulin, and D50 in ED, then HD with 1K bath for 1 hr, then 2K; now 3.5.  4. Hypertension/volume - BP most recently 171/88 on Clonidine 0.3 mg patch weekly, Amlodipine 10 mg qd, lisinopril 20 mg qhs, and Metoprolol 5 mg IV q8h (outpatient meds are Clonidine 0.3 mg bid and Fosinopril 40 mg qd); post-HD wt yesterday 67.9 kg s/p net UF of  1662 ml (EDW of 70.5 kg).  5. Anemia - Hgb 10.2 on Epogen 4200 U. On hold.  6. Metabolic bone disease - Ca 9, P 8.7, on Fosrenol 1 g and Phoslo 3 with meals, Phoslo 1 with snacks, also Zemplar.  7. Nutrition - last Alb 3.  8. Alcohol abuse - level of 230 on admission, repeated binges, but indicates desire to stop; Ativan held secondary to oversedation..  LOS: 4 days   [name] 01/14/2012,9:20 AM

## 2012-01-14 NOTE — Progress Notes (Signed)
I have seen and examined this patient and agree with the plan of care, dialysis in AM .  [facility] W 01/14/2012, 11:46 AM

## 2012-01-14 NOTE — Progress Notes (Signed)
Aware of need for substance abuse assessment and needs as well as noncompliance with HD.  Attempted to meet with patient, however he is currently going through DT's from alcohol.  Patient is in bed pulling at sheets and has mitts on for safety.  Patient is unable to appropriately participate in discussion and sitter at the bedside who reports patient has been restless and agitated all morning.  Will re-attempt tomorrow if patient is able to participate.  Will discuss with unit CSW as well regarding referral and needs.  Ashley Jacobs, MSW LCSW [phone]  Coverage for [facility] Fava

## 2012-01-14 NOTE — Progress Notes (Addendum)
Subjective: He appears sleepy and is falling asleep multiple times during our conversation. Does not admit to any pain anywhere and is oriented times 2. Not oriented to place. He states he did not drink anything yesterday and will try to drink something this morning. Still in soft restraints.   Objective: Vital signs in last 24 hours: Filed Vitals:   01/13/12 1814 01/13/12 1930 01/13/12 2139 01/14/12 0530  BP: 182/90  191/94 187/98  Pulse:   99 94  Temp:   99.3 F (37.4 C) 99.7 F (37.6 C)  TempSrc:   Oral Oral  Resp:   21 20  Height:      Weight:      SpO2:  92% 97% 92%   Weight change: 10.6 oz (0.3 kg)  Intake/Output Summary (Last 24 hours) at 01/14/12 0757 Last data filed at 01/13/12 1800  Gross per 24 hour  Intake    363 ml  Output   1662 ml  Net  -1299 ml   General: resting in bed, very lethargic HEENT: PERRL, EOMI, no scleral icterus, laceration on nose  Cardiac: RRR, no rubs, murmurs or gallops  Pulm: clear to auscultation bilaterally, poor effort, no overt wheezing Abd: soft, mildly tender, normoactive bowel sounds, no palpable masses/pulses  Ext: warm and well perfused, no pedal edema  Neuro: alert and oriented X2, cranial nerves II-XII grossly intact  Lab Results: Basic Metabolic Panel:  Lab 01/13/12 4782 01/13/12 0421 01/12/12 0615  NA 139 138 --  K 3.5 3.1* --  CL 95* 104 --  CO2 19 21 --  GLUCOSE 69* 121* --  BUN 49* 17 --  CREATININE 9.29* 0.76 --  CALCIUM 9.0 9.1 --  MG -- -- --  PHOS 8.7* -- 9.1*   Liver Function Tests:  Lab 01/13/12 0918 01/12/12 0615 01/10/12 1725  AST -- -- 28  ALT -- -- 18  ALKPHOS -- -- 102  BILITOT -- -- 0.3  PROT -- -- 7.5  ALBUMIN 3.0* 3.0* --    Lab 01/10/12 1725  LIPASE 54  AMYLASE --   CBC:  Lab 01/13/12 0917 01/12/12 0900 01/10/12 1725  WBC 4.8 9.1 --  NEUTROABS -- -- 1.9  HGB 10.2* 11.4* --  HCT 31.2* 34.0* --  MCV 100.0 97.7 --  PLT 77* 80* --   Alcohol Level:  Lab 01/10/12 1725  ETH 230*    Medications: I have reviewed the patient's current medications. Scheduled Meds:    . ipratropium  0.5 mg Nebulization TID   And  . albuterol  2.5 mg Nebulization TID  . cloNIDine  0.3 mg Transdermal Weekly  . feeding supplement  1 Container Oral BID BM  . folic acid  1 mg Oral Daily  . heparin  5,000 Units Subcutaneous Q8H  . HYDROcodone-acetaminophen      . lisinopril  10 mg Oral QHS  . metoprolol  5 mg Intravenous Q8H  . multivitamin with minerals  1 tablet Oral Daily  . pantoprazole (PROTONIX) IV  40 mg Intravenous Q24H  . paricalcitol  1 mcg Intravenous Q M,W,F-HD  . sodium chloride  3 mL Intravenous Q12H  . thiamine  100 mg Oral Daily   Or  . thiamine  100 mg Intravenous Daily  . DISCONTD: fosinopril  10 mg Oral QHS  . DISCONTD: insulin aspart  5 Units Subcutaneous Once  . DISCONTD: lisinopril  10 mg Oral QHS  . DISCONTD: LORazepam  2 mg Intravenous Q4H  . DISCONTD: paricalcitol  1 mcg  Intravenous 3 times weekly  . DISCONTD: sodium chloride  3 mL Intravenous Q12H   Continuous Infusions:  PRN Meds:.acetaminophen, acetaminophen, albuterol, heparin, heparin, hydrALAZINE, HYDROcodone-acetaminophen, ipratropium, LORazepam, LORazepam, ondansetron, DISCONTD: acetaminophen, DISCONTD: acetaminophen, DISCONTD:  morphine injection, DISCONTD: ondansetron (ZOFRAN) IV, DISCONTD: sodium chloride Assessment/Plan:  1.Abdominal pain, generalized - Pt does come in and out of the hospital with this problem and is likely related to alcoholic gastritis or acute on chronic pancreatitis. -Clear liquids as more awake today -IV protonix  2. Alcohol withdrawal: The patient was on binge causing him to miss dialysis. His blood alcohol level  was 230 on admission. He started having symptoms of withdrawal Saturday which seem to have been treated to oversedation and stopping scheduled ativan today and transfer from SDU to med surg. This patient does tend to exaggerate his symptoms in past  hospitalizations and this stay to get more ativan. -Continue Ativan PRN per CIWA, will reduce dosing  3. Hyperkalemia - Resolved. K- 3.5 , HD 6/10.The patient was admitted elevated K from missing dialysis and normalized with dialysis.   4. Pancytopenia - Likely due to his alcoholism. WBC does trend up and down in the past. Has been high in the past and will check after dialysis.  WBC - 4.8 on 6/10.   5. HYPERTENSION, ESSENTIAL - BP elevated secondary to withdrawal . He is on clonidine and fosinopril at home. Fosinopril restarted today.Metoprolol IV was started during this admission with elevated BP  - Fosinopril today - continue Metoprolol for now.    6. End stage renal disease on dialysis - Came in having missed two dialysis sessions due to binge drinking. Has had two dialysis sessions since. Normally MWF.    7.Alcohol abuse -  I did advise him that this habit could be lethal and he seemed to want to stop.    Disposition - Clear liquids today. Possible D/C tomorrow. Will re-evaluate mental status later today.    LOS: 4 days   Genella Mech 01/14/2012, 7:57 AM  Internal Medicine Teaching Service Attending Note Date: 01/14/2012  Patient name: [name]  Medical record number: [id number]  Date of birth: [birth year]    This patient has been seen and discussed with the house staff. Please see their note for complete details. I concur with their findings with the following additions/corrections: 56 year old alcoholic who misses his HD appts and came in with volume overload abdominal pain and hyepkalemia. He received excess ativan which is going to be slow to clear renally. His hx of theatrics to obtain sedatives and narcotics can make assessment of actual withdrawal tricky. We will lighten his CIWA meds.  Paulette Blanch Dam 01/14/2012, 12:08 PM

## 2012-01-18 IMAGING — XA IR SHUNTOGRAM/ FISTULAGRAM
1 series · 13 of 15 positions shown · non-contrast
Comparison: none

CLINICAL DATA: Poor function of left arm straight graft

AV SHUNTOGRAM
Procedure:  The left arm was prepped and draped in a sterile
fashion.  An 18 gauge Angiocath was inserted into the AV graft.
Contrast was injected.  The Angiocath was removed.  Hemostasis was
achieved with direct pressure. No complication.

[Series 1: run · 13 of 15 slices shown]
[im 1/15]
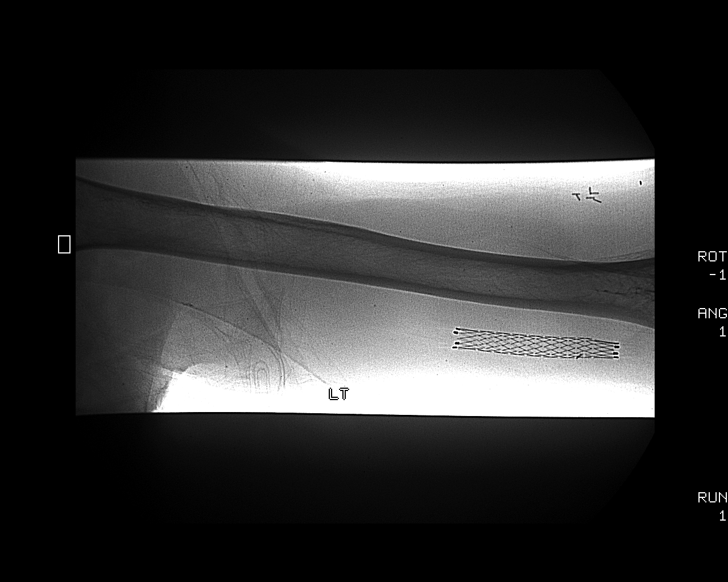
[im 2/15]
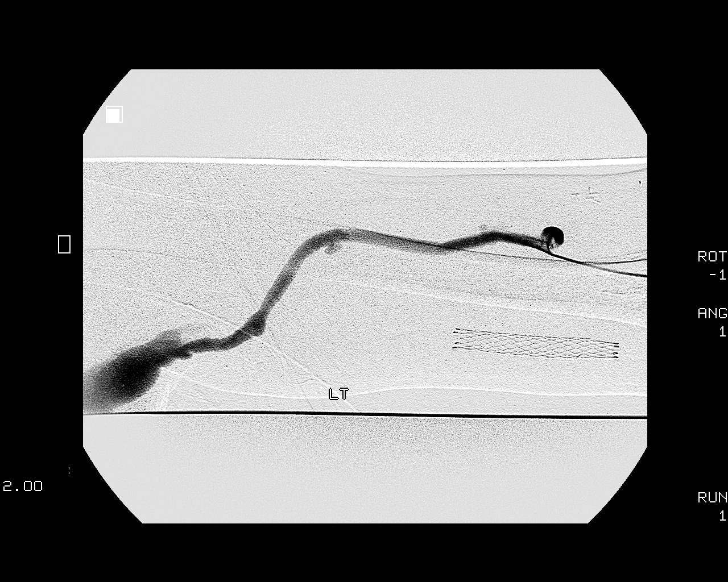
[im 3/15]
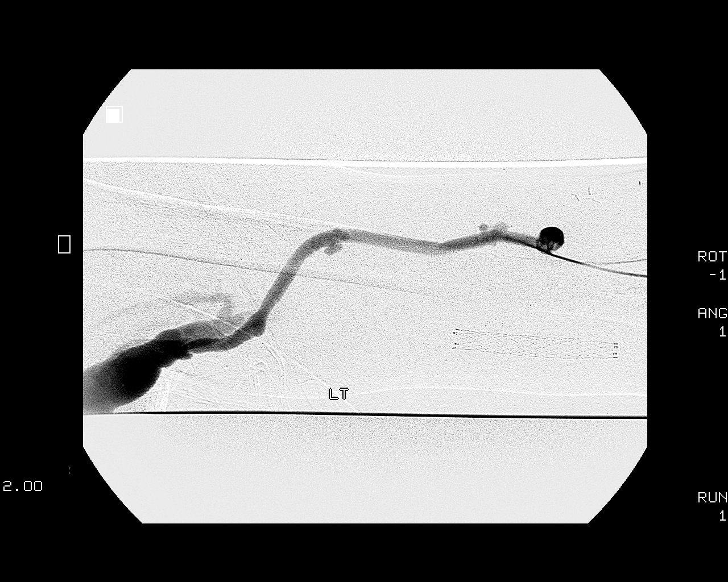
[im 5/15]
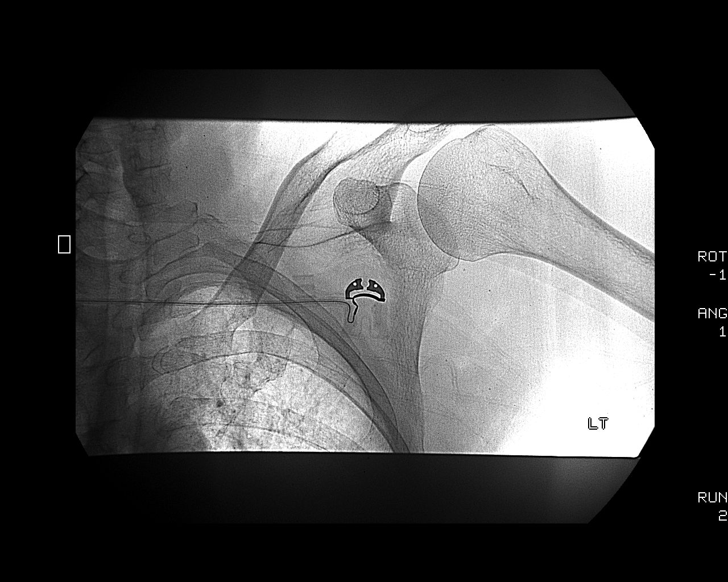
[im 6/15]
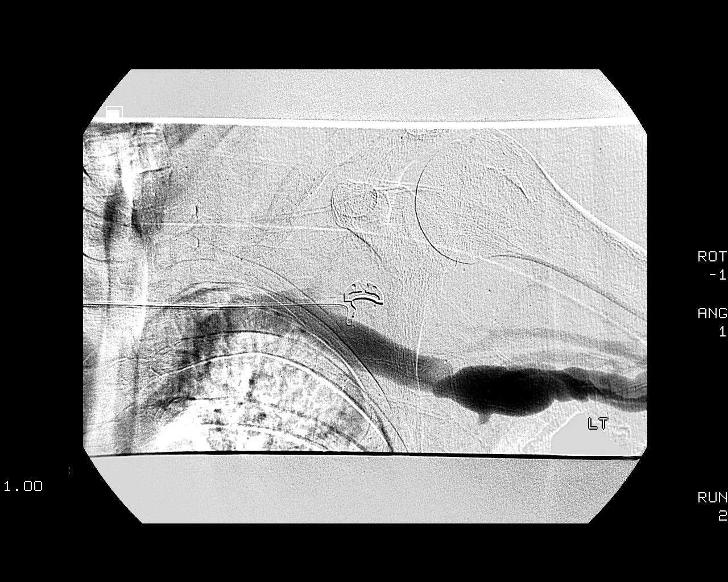
[im 7/15]
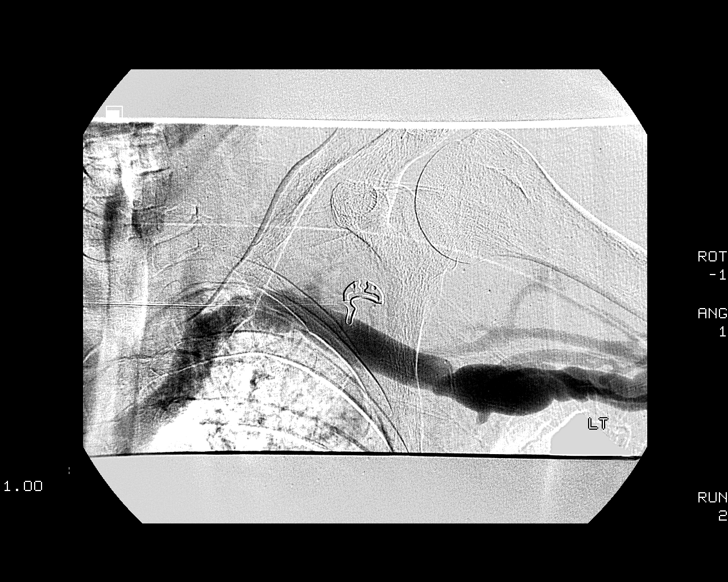
[im 8/15]
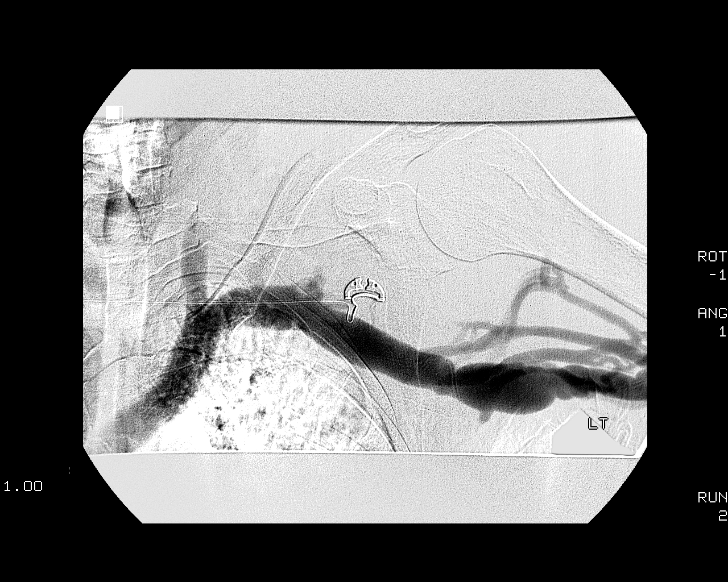
[im 9/15]
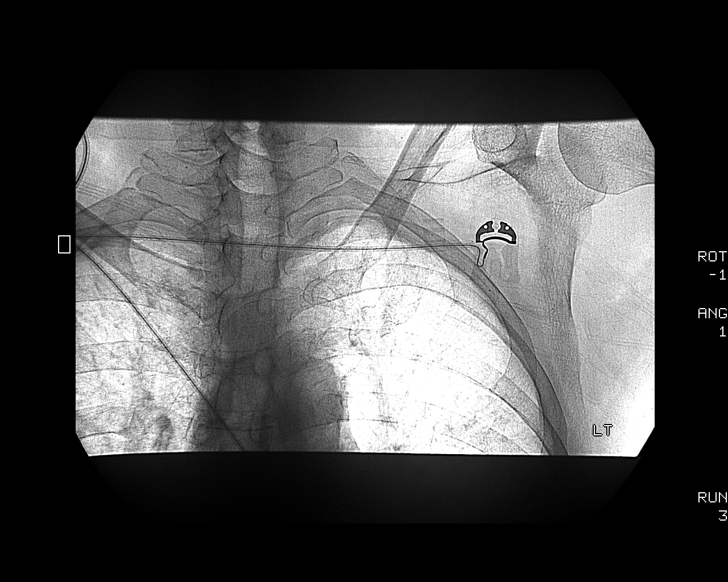
[im 10/15]
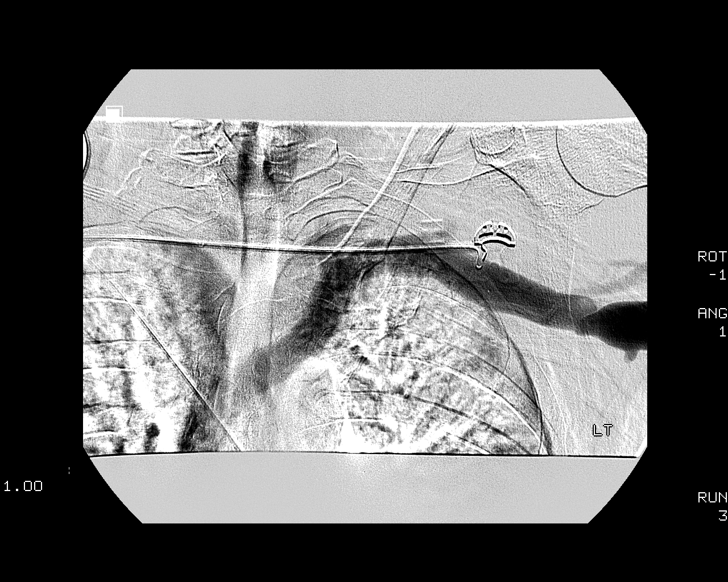
[im 11/15]
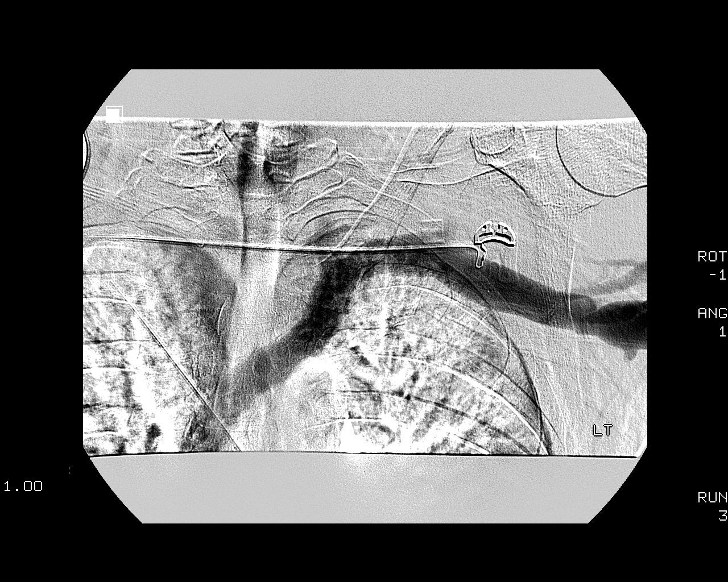
[im 13/15]
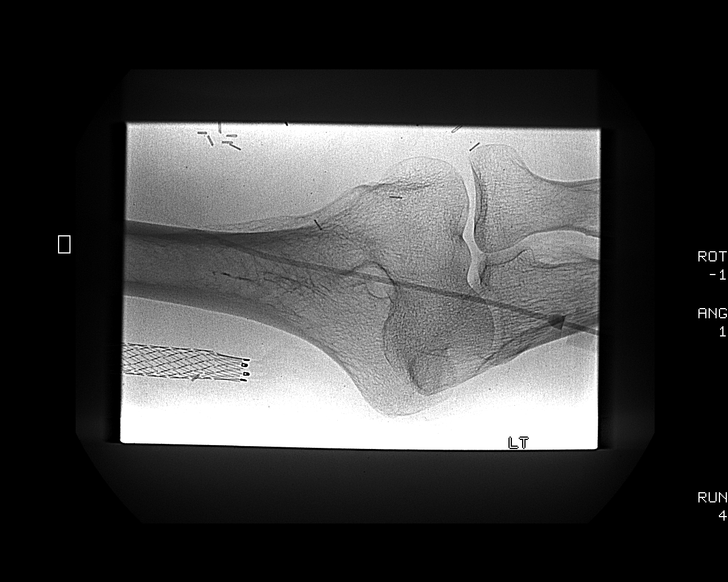
[im 14/15]
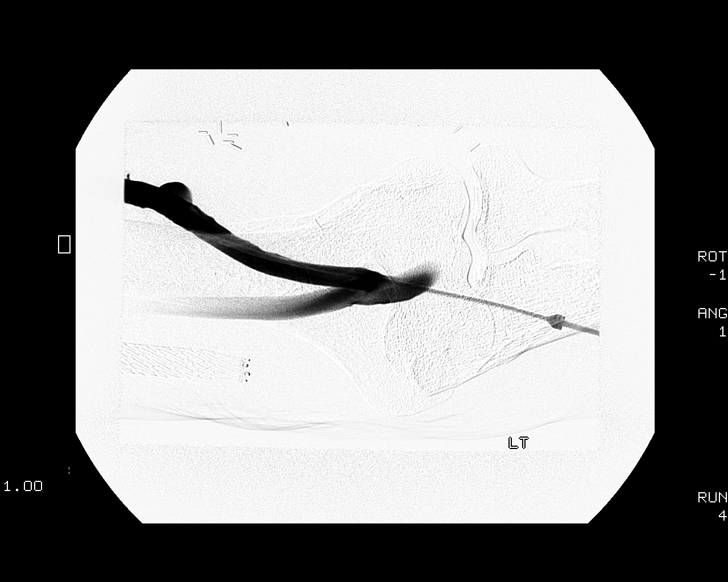
[im 15/15]
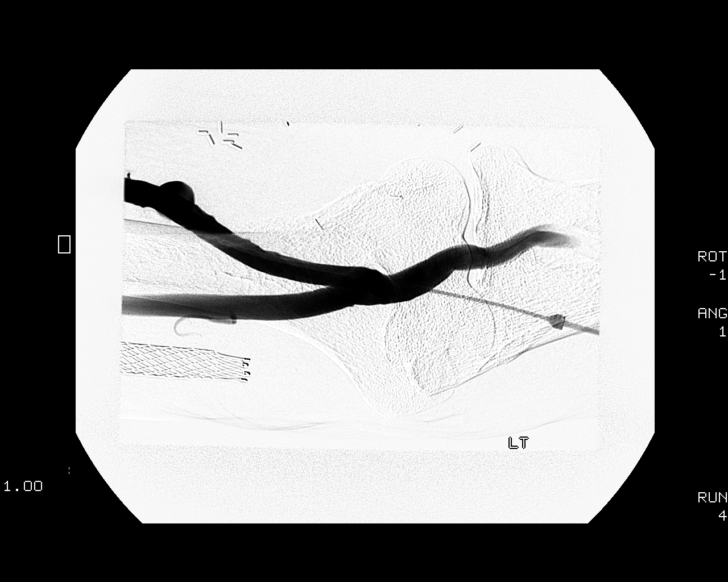

[13 of 15 positions shown; findings below may reference images not displayed]

FINDINGS: Arterial anastomosis, graft, venous anastomosis and
central venous structures are all widely patent.  Small
pseudoaneurysm in the distal graft is noted.
IMPRESSION: No significant stenosis in the left arm AV graft circuit.

## 2012-01-18 IMAGING — CR DG CHEST 1V PORT
1 series · 1 of 1 positions shown · non-contrast
Comparison: Portal chest 04/04/2011.

CLINICAL DATA: As of breath.

PORTABLE CHEST - 1 VIEW

[AP]
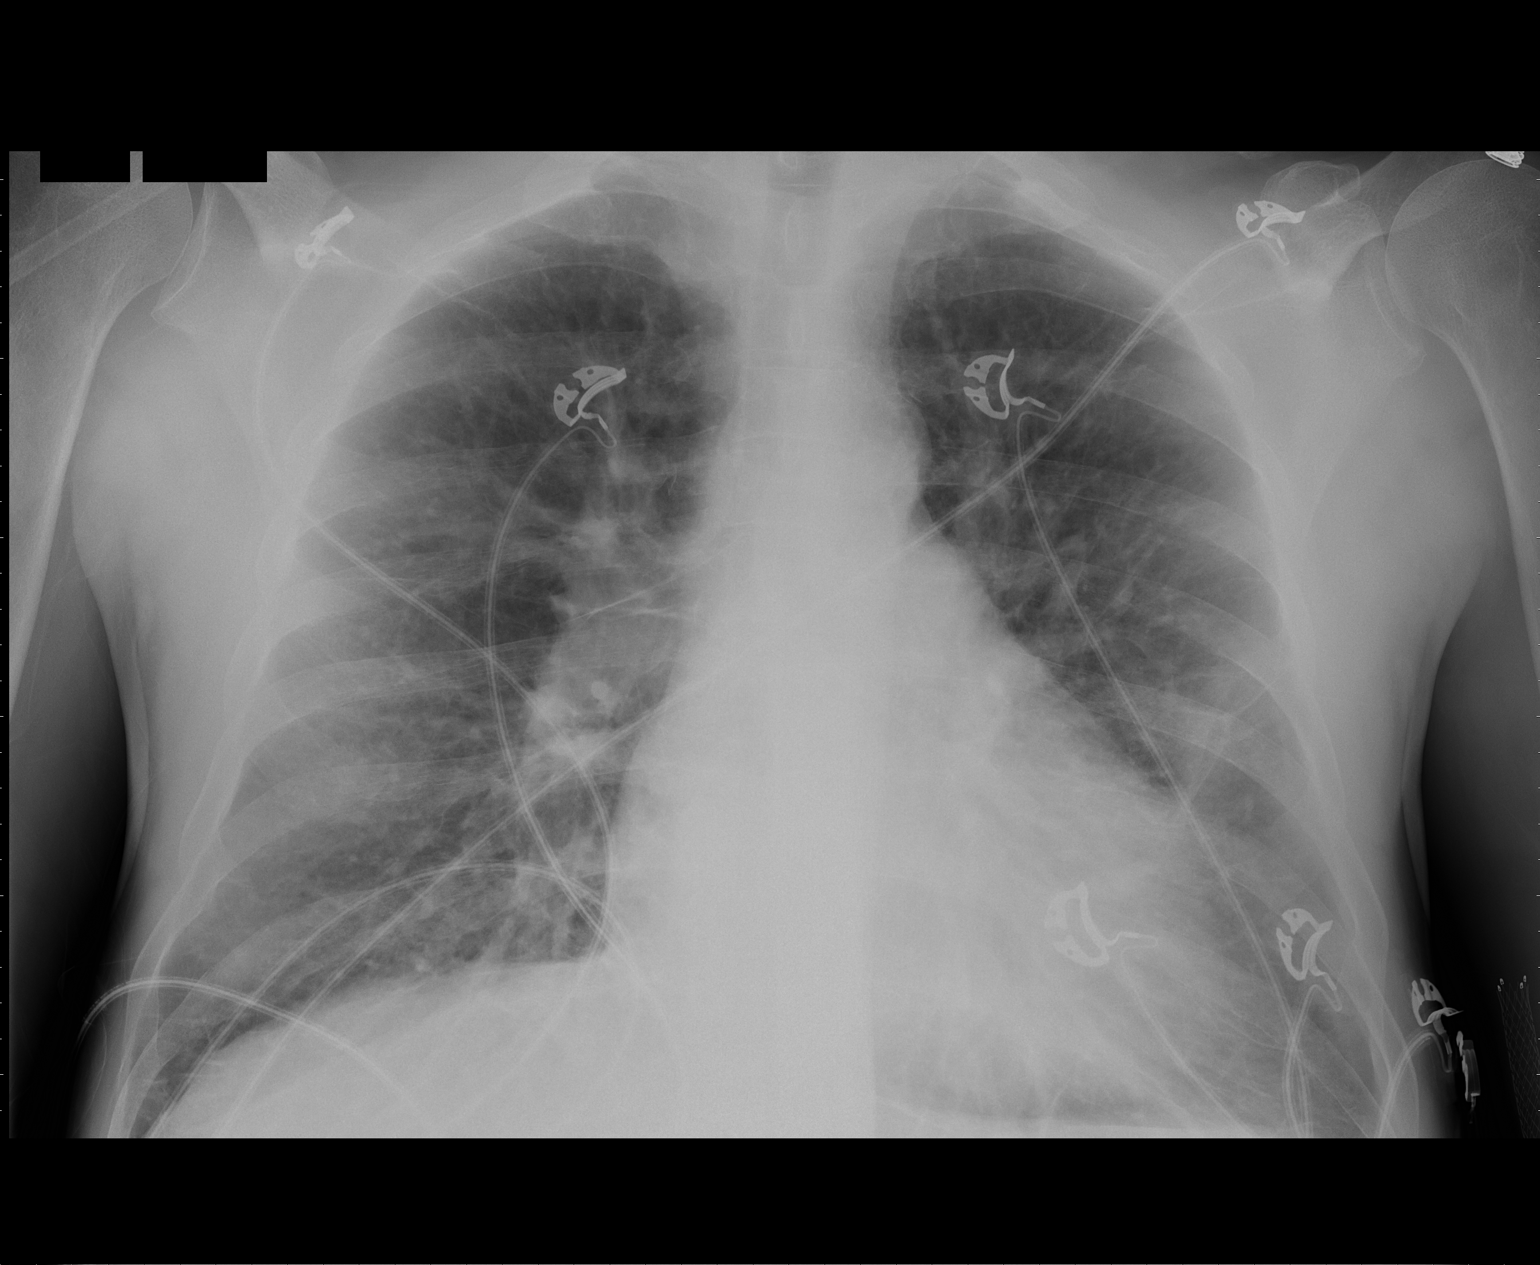

[1 of 1 positions shown; findings below may reference images not displayed]

FINDINGS: The heart is mildly enlarged.  Mild pulmonary vascular
congestion is evident.  Minimal bibasilar atelectasis is present.
There is improved aeration at the right lung base.  The upper lung
fields are clear.  The visualized soft tissues and bony thorax are
unremarkable.
IMPRESSION: 1.  Borderline cardiomegaly and pulmonary vascular congestion.
This could represent early congestive heart failure.
2.  Minimal bibasilar atelectasis.

## 2012-01-18 IMAGING — CR DG ABDOMEN 1V
2 series · 2 of 2 positions shown · non-contrast
Comparison: 09/27/2010

CLINICAL DATA: Abdominal pain and distension.  Shortness of breath
and nausea.

ABDOMEN - 1 VIEW

[t abdomen supine (1 of 2)]
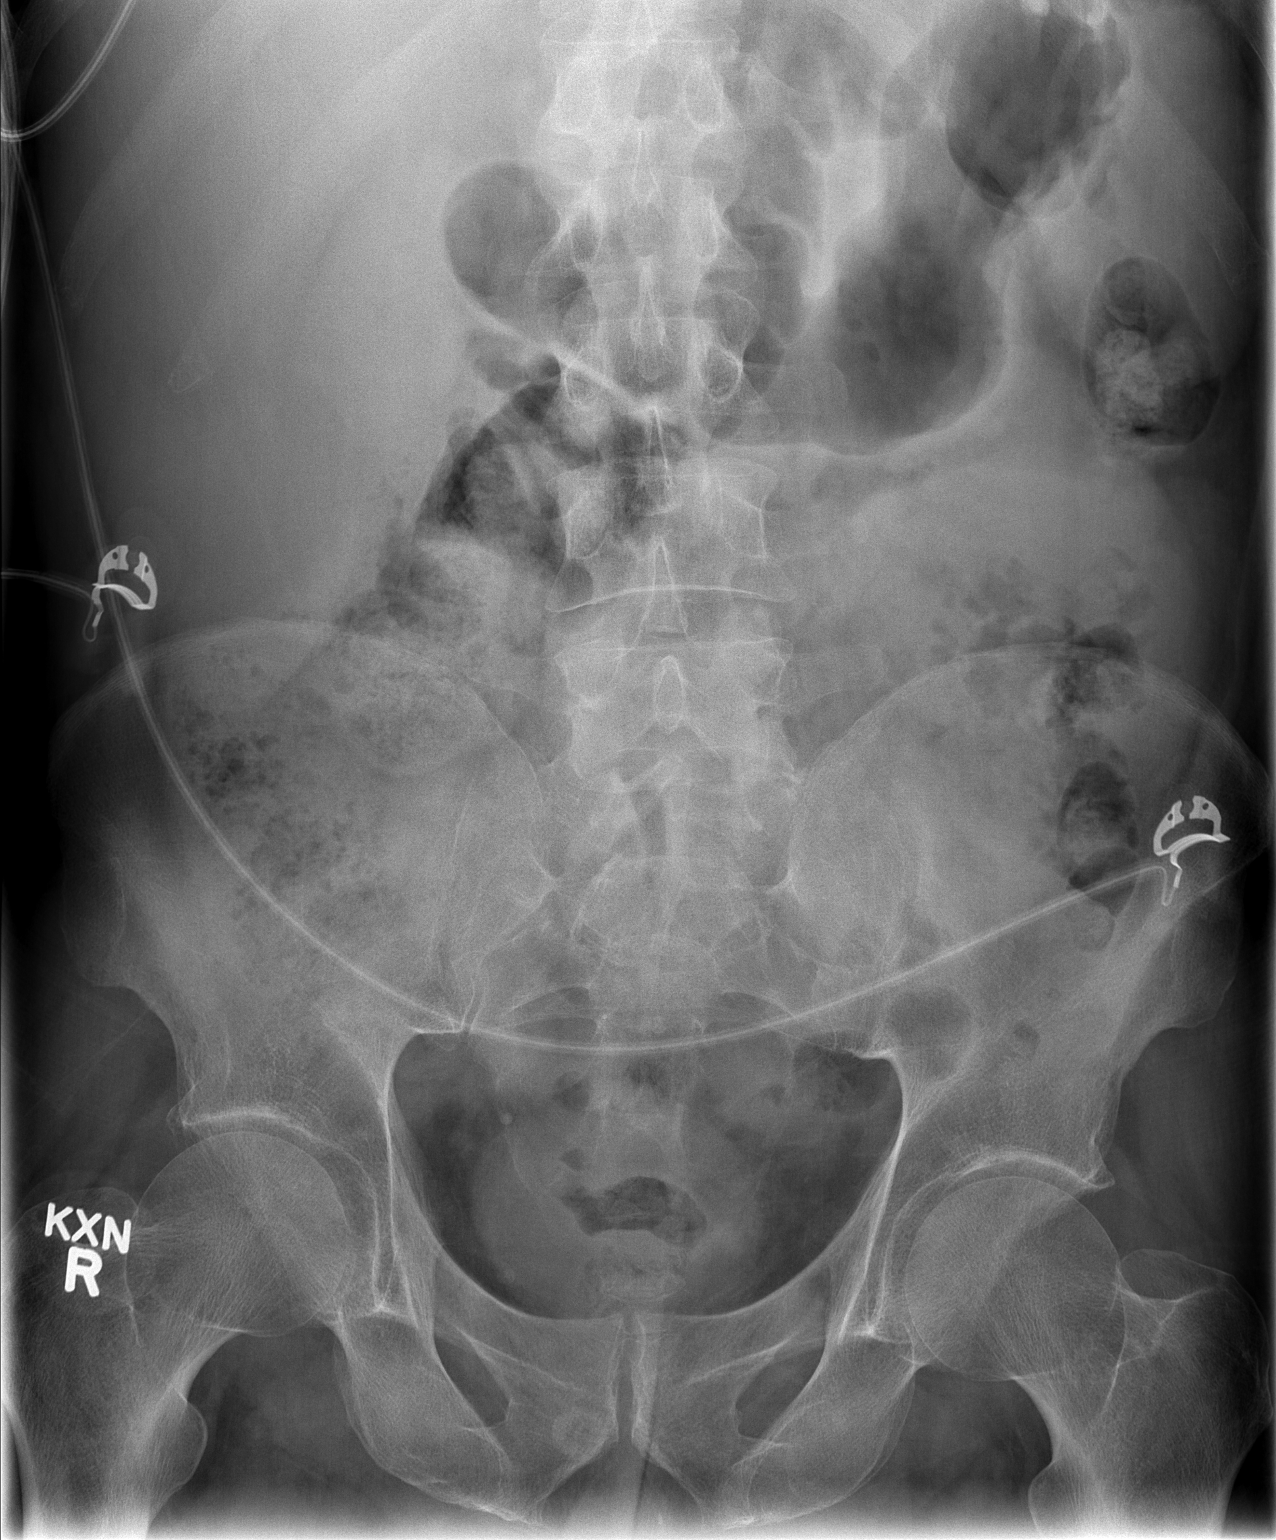

[t abdomen supine (2 of 2)]
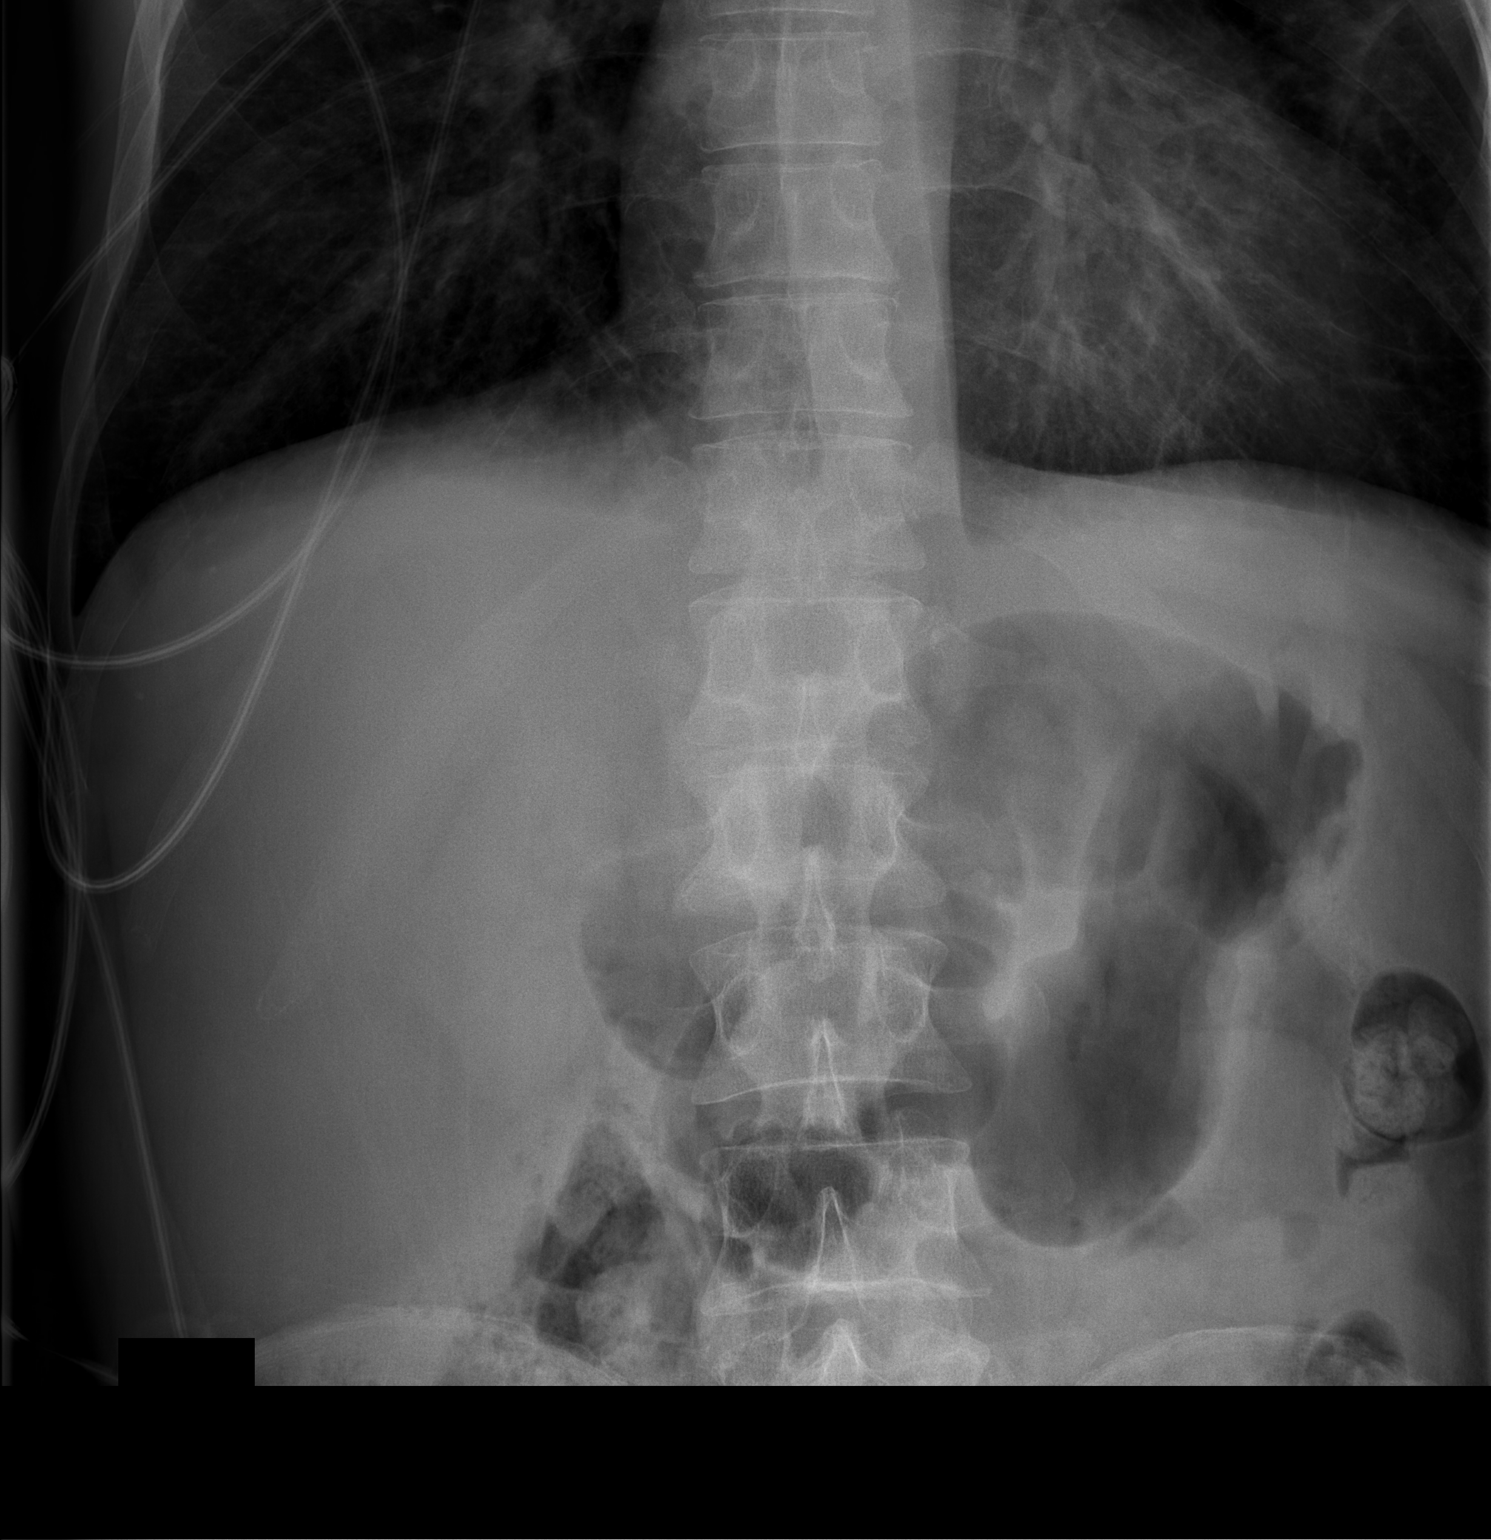

[2 of 2 positions shown; findings below may reference images not displayed]

FINDINGS: Supine views of the abdomen demonstrate gas and stool
throughout the colon without small or large bowel distension.  No
obvious wall thickening.  No radiopaque stones.  Calcified
phleboliths in the pelvis.  Degenerative changes in the lumbar
spine.
IMPRESSION: Stool filled colon.  Nonobstructive bowel gas pattern.

## 2012-01-19 IMAGING — US US ABDOMEN COMPLETE
1 series · 14 of 25 positions shown · non-contrast
Comparison: CT abdomen pelvis dated 04/03/2011

CLINICAL DATA: Hepatitis C, abdominal pain, nausea/vomiting,
pancreatitis

COMPLETE ABDOMINAL ULTRASOUND

[Series 1: us abdomen complete · 0.35mm/px · 14 of 56 slices shown]
[im 1/56]
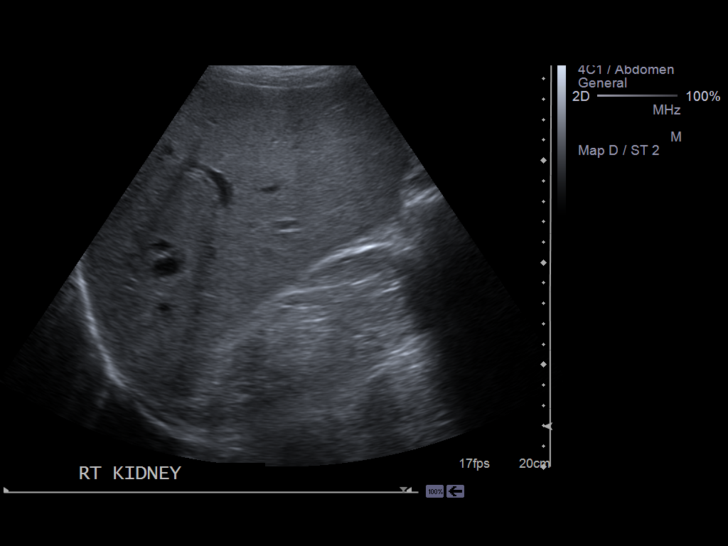
[im 5/56]
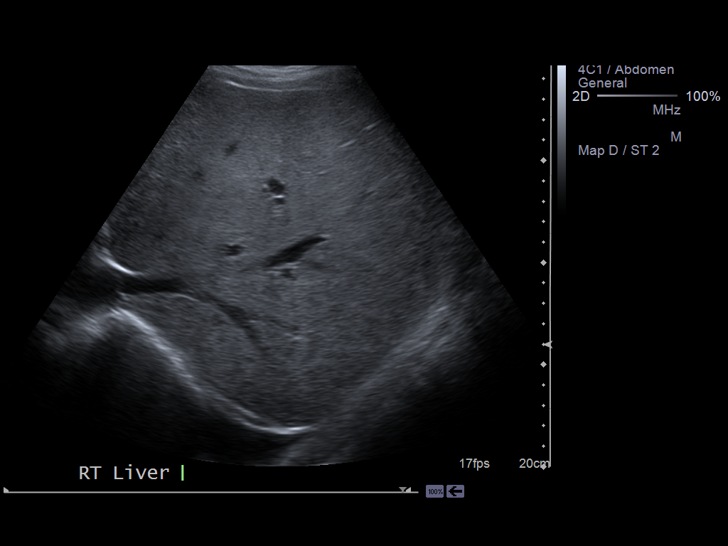
[im 10/56]
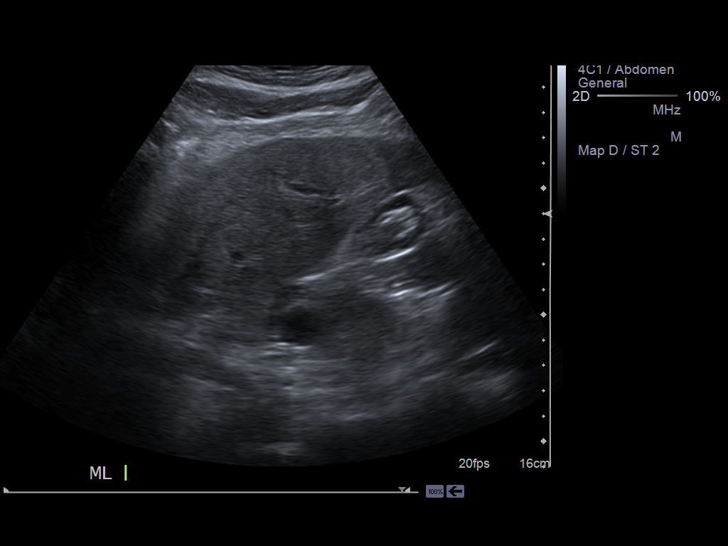
[im 14/56]
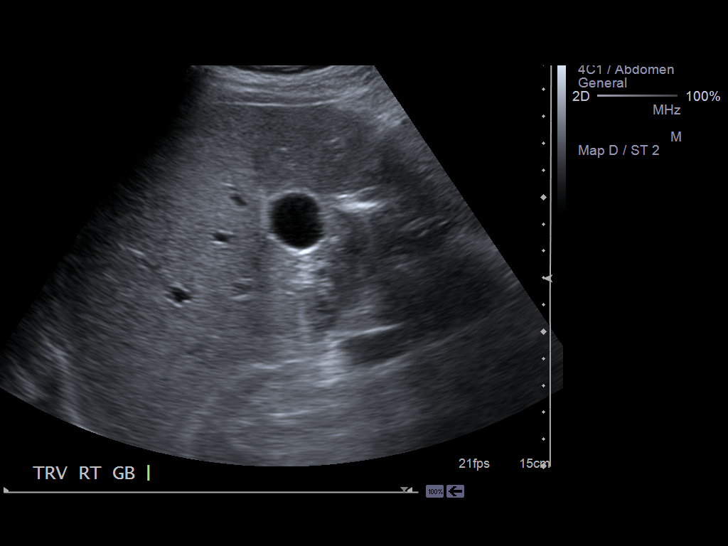
[im 19/56]
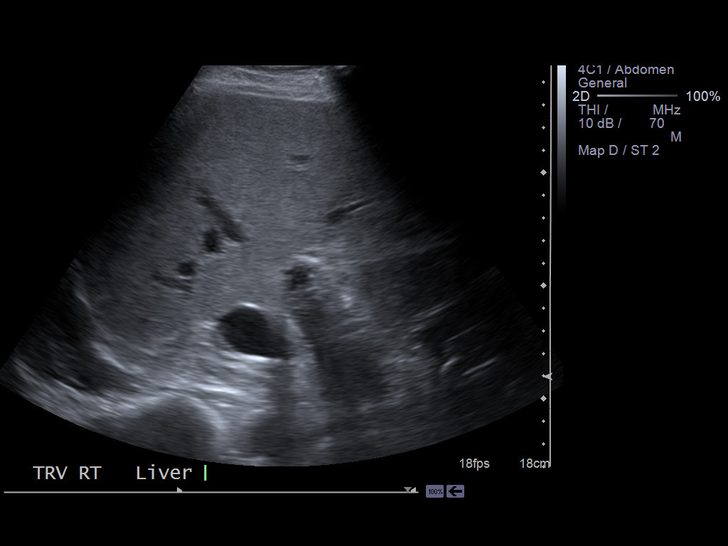
[im 21/56]
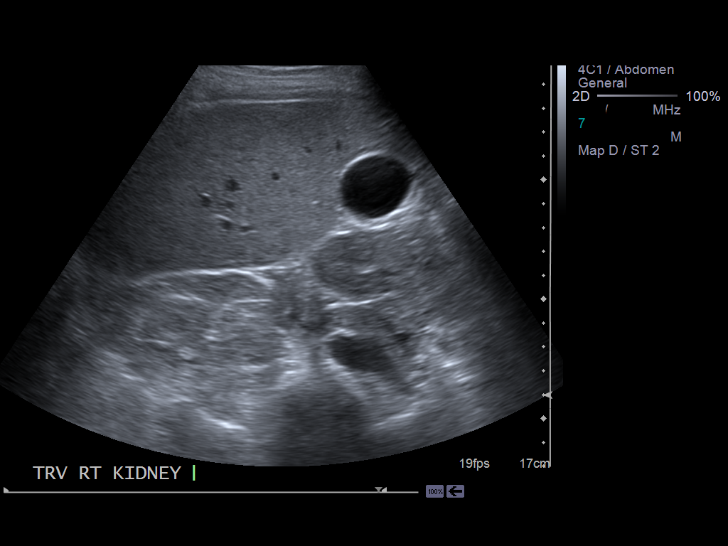
[im 26/56]
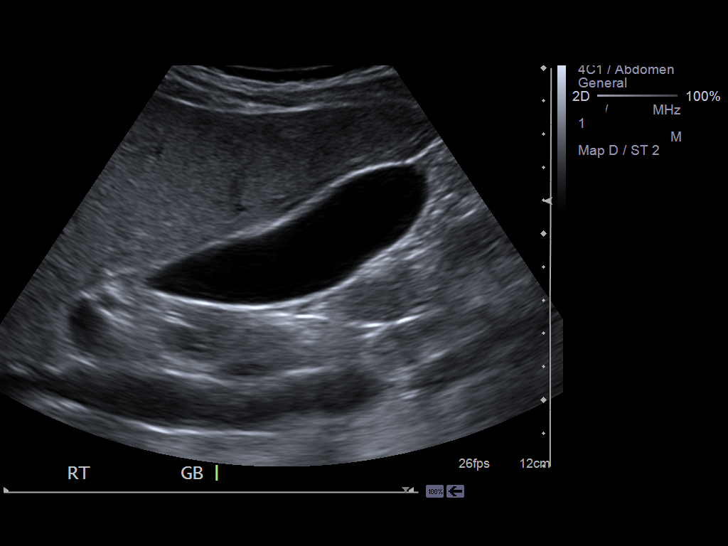
[im 30/56]
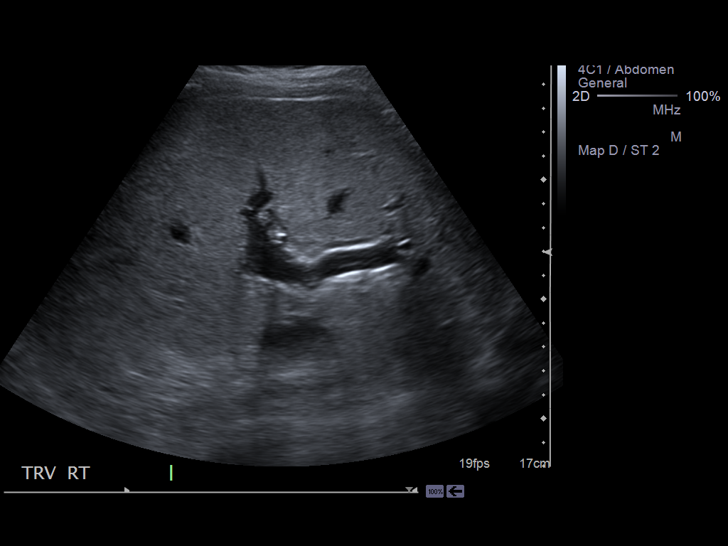
[im 35/56]
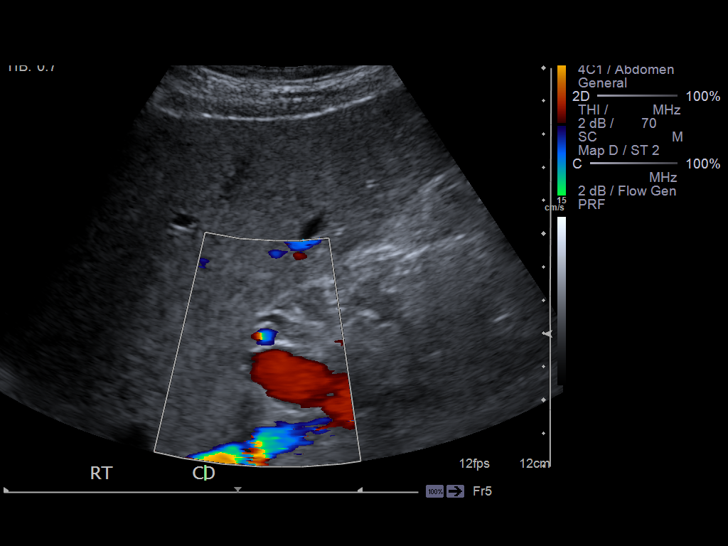
[im 37/56]
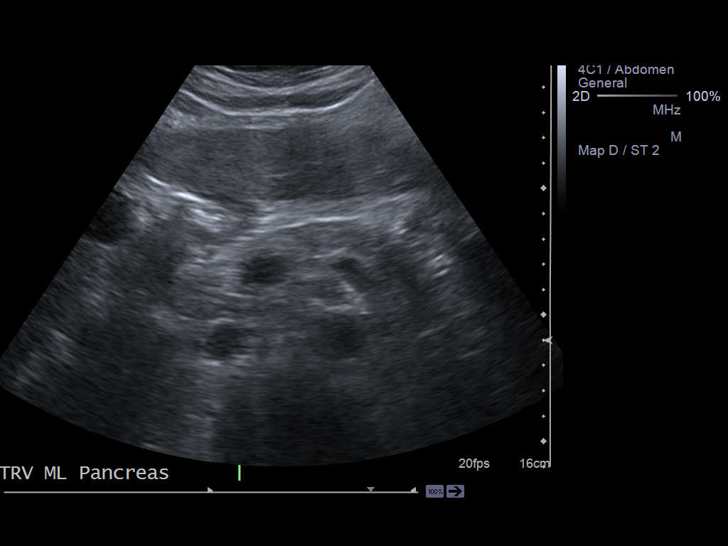
[im 42/56]
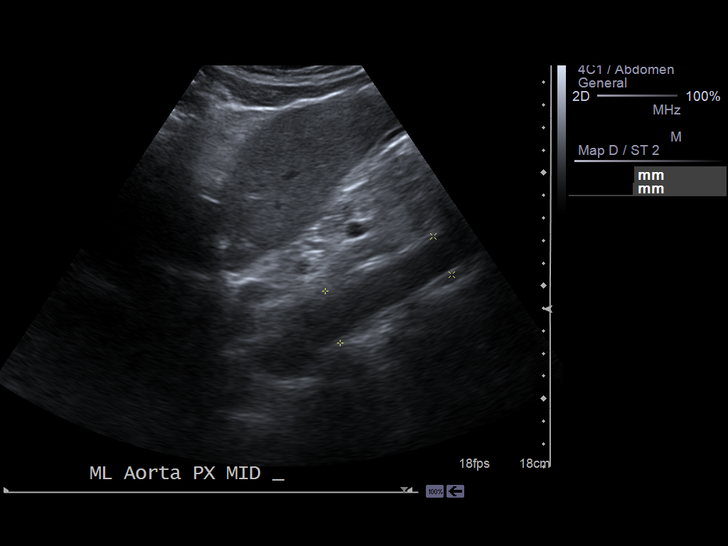
[im 46/56]
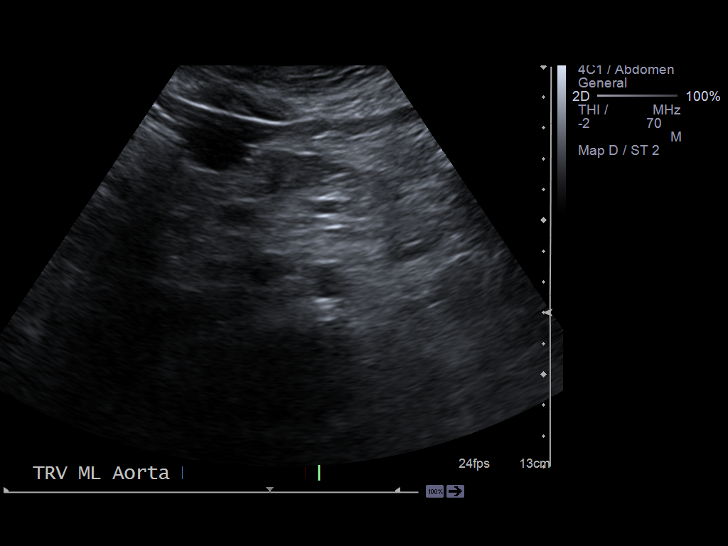
[im 51/56]
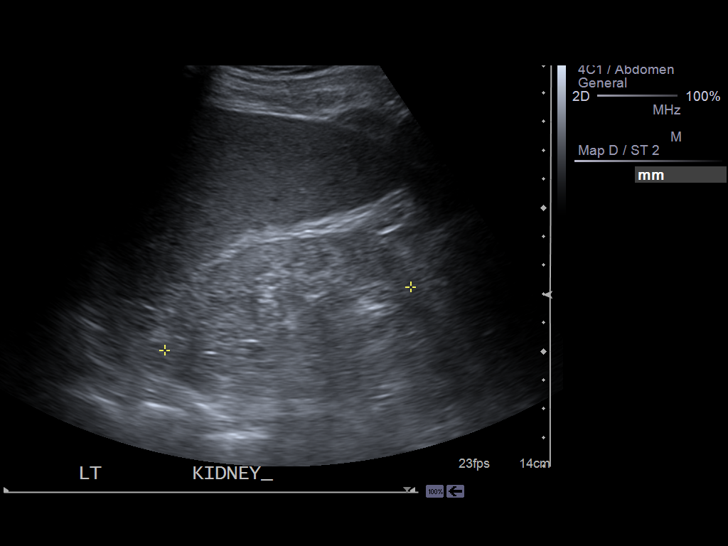
[im 56/56]
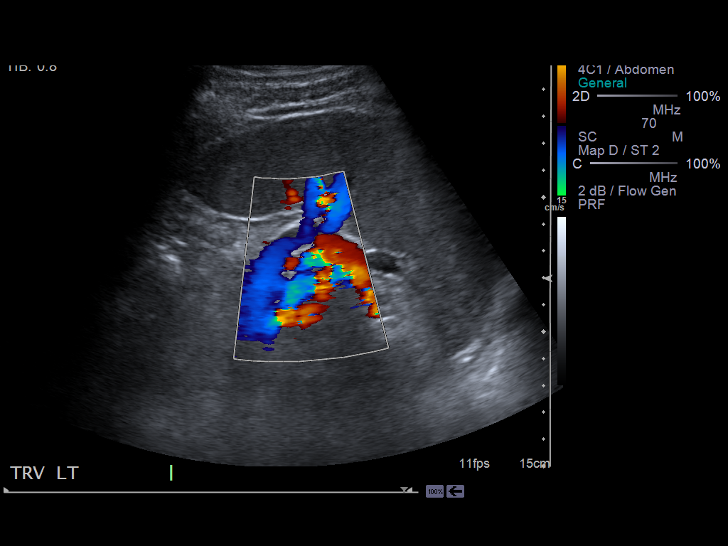

[14 of 25 positions shown; findings below may reference images not displayed]

FINDINGS: Gallbladder:  No gallstones, gallbladder wall thickening, or
pericholecystic fluid.  Negative sonographic Murphy's sign.

Common bile duct:  Measures 4 mm.

Liver:  Coarse, hyperechoic hepatic parenchyma, suggesting hepatic
steatosis.  No focal lesion identified.

IVC:  Appears normal.

Pancreas:  Visualized portions are grossly unremarkable.

Spleen:  Measures 10.7 cm.

Right Kidney:  Cortical atrophy with echogenic renal parenchyma,
compatible with end-stage renal disease.  Measures 8.4 cm.  No
hydronephrosis.

Left Kidney:   Cortical atrophy with echogenic renal parenchyma,
compatible with end-stage renal disease.  Measures 8.8 cm.  No
hydronephrosis.

Abdominal aorta:  No aneurysm identified.

Additional comments: Small right pleural effusion.
IMPRESSION: Suspected hepatic steatosis.

Bilateral renal atrophy with echogenic renal parenchyma, compatible
with end-stage renal disease.

## 2012-01-21 ENCOUNTER — Other Ambulatory Visit: Payer: Self-pay | Admitting: Gastroenterology

## 2012-01-21 MED ORDER — MOVIPREP 100 G PO SOLR: 1.0000 | ORAL | Status: DC

## 2012-01-21 NOTE — Telephone Encounter (Signed)
Prep resent

## 2012-01-22 ENCOUNTER — Ambulatory Visit (AMBULATORY_SURGERY_CENTER): Payer: Medicare Other | Admitting: Gastroenterology

## 2012-01-22 ENCOUNTER — Encounter: Payer: Self-pay | Admitting: Gastroenterology

## 2012-01-22 VITALS — BP 164/87 | HR 64 | Temp 96.6°F | Resp 16 | Ht 72.0 in | Wt 162.0 lb

## 2012-01-22 DIAGNOSIS — K625 Hemorrhage of anus and rectum: Secondary | ICD-10-CM

## 2012-01-22 DIAGNOSIS — D126 Benign neoplasm of colon, unspecified: Secondary | ICD-10-CM

## 2012-01-22 DIAGNOSIS — R109 Unspecified abdominal pain: Secondary | ICD-10-CM

## 2012-01-22 DIAGNOSIS — K644 Residual hemorrhoidal skin tags: Secondary | ICD-10-CM

## 2012-01-22 MED ORDER — SODIUM CHLORIDE 0.9 % IV SOLN: 500.0000 mL | INTRAVENOUS | Status: DC

## 2012-01-22 NOTE — Op Note (Signed)
[facility] [address] [place], [place]  [address]  COLONOSCOPY PROCEDURE REPORT  PATIENT:  [name], [name]  MR#:  [id number] BIRTHDATE:  1956/08/03, 56 yrs. old  GENDER:  male ENDOSCOPIST:  Rachael Fee, MD REFERRING:  [name] PROCEDURE DATE:  01/22/2012 PROCEDURE:  Colonoscopy with snare polypectomy ASA CLASS:  Class III INDICATIONS:  intermittent rectal bleeding, alcoholic, renal failure MEDICATIONS:   propofol (Diprivan) 150 mcg IV DESCRIPTION OF PROCEDURE:   After the risks benefits and alternatives of the procedure were thoroughly explained, informed consent was obtained.  Digital rectal exam was performed and revealed no rectal masses.   The LB [id number] endoscope was introduced through the anus and advanced to the cecum, which was identified by both the appendix and ileocecal valve, without limitations.  The quality of the prep was good..  The instrument was then slowly withdrawn as the colon was fully examined. <<PROCEDUREIMAGES>> FINDINGS:   Two 5mm sessile polyps were removed with cold snare from the descending colon and sent to pathology (jar 1) (see image3).  Internal and external Hemorrhoids were found.  This was otherwise a normal examination of the colon (see image1, image2, and image5).   Retroflexed views in the rectum revealed no abnormalities. COMPLICATIONS:  None ENDOSCOPIC IMPRESSION: 1) Two small  polyps were removed and sent to pathology 2) Internal and external hemorrhoids 3) Otherwise normal examination  RECOMMENDATIONS: 1) If the polyp(s) removed today are proven to be adenomatous (pre-cancerous) polyps, you will need a repeat colonoscopy in 5 years. Otherwise you should continue to follow colorectal cancer screening guidelines for "routine risk" patients with colonoscopy in 10 years. You will receive a letter within 1-2 weeks with the results of your biopsy as well as final recommendations. Please call my office  if you have not received a letter after 3 weeks. 2) You should try to stop drinking alcohol. It is likely causing your abdominal pains. Use OTC hemorrhoidal cream as needed (such as preparation H).  ______________________________ Rachael Fee, MD  n. eSIGNED:   Rachael Fee at 01/22/2012 08:51 AM  Antonietta Barcelona, [phone]

## 2012-01-22 NOTE — Progress Notes (Signed)
Patient did not experience any of the following events: a burn prior to discharge; a fall within the facility; wrong site/side/patient/procedure/implant event; or a hospital transfer or hospital admission upon discharge from the facility. (G8907) Patient did not have preoperative order for IV antibiotic SSI prophylaxis. (G8918)  

## 2012-01-22 NOTE — Patient Instructions (Addendum)
YOU NEED TO STOP DRINKING ALCOHOL YOU NEED TO STOP TAKING NSAID TYPE PAIN MEDS (ADVIL, ALLEVE, MOTRIN, IBUPROFEN, ETC) YOU HAD AN ENDOSCOPIC PROCEDURE TODAY AT THE [facility]: Refer to the procedure report that was given to you for any specific questions about what was found during the examination.  If the procedure report does not answer your questions, please call your gastroenterologist to clarify.  If you requested that your care partner not be given the details of your procedure findings, then the procedure report has been included in a sealed envelope for you to review at your convenience later.  YOU SHOULD EXPECT: Some feelings of bloating in the abdomen. Passage of more gas than usual.  Walking can help get rid of the air that was put into your GI tract during the procedure and reduce the bloating. If you had a lower endoscopy (such as a colonoscopy or flexible sigmoidoscopy) you may notice spotting of blood in your stool or on the toilet paper. If you underwent a bowel prep for your procedure, then you may not have a normal bowel movement for a few days.  DIET: Your first meal following the procedure should be a light meal and then it is ok to progress to your normal diet.  A half-sandwich or bowl of soup is an example of a good first meal.  Heavy or fried foods are harder to digest and may make you feel nauseous or bloated.  Likewise meals heavy in dairy and vegetables can cause extra gas to form and this can also increase the bloating.  Drink plenty of fluids but you should avoid alcoholic beverages for 24 hours.  ACTIVITY: Your care partner should take you home directly after the procedure.  You should plan to take it easy, moving slowly for the rest of the day.  You can resume normal activity the day after the procedure however you should NOT DRIVE or use heavy machinery for 24 hours (because of the sedation medicines used during the test).    SYMPTOMS TO REPORT IMMEDIATELY: A  gastroenterologist can be reached at any hour.  During normal business hours, 8:30 AM to 5:00 PM Monday through Friday, call [phone].  After hours and on weekends, please call the GI answering service at [phone] who will take a message and have the physician on call contact you.   Following lower endoscopy (colonoscopy or flexible sigmoidoscopy):  Excessive amounts of blood in the stool  Significant tenderness or worsening of abdominal pains  Swelling of the abdomen that is new, acute  Fever of 100F or higher  ACTIVITY: Your care partner should take you home directly after the procedure.  You should plan to take it easy, moving slowly for the rest of the day.  You can resume normal activity the day after the procedure however you should NOT DRIVE or use heavy machinery for 24 hours (because of the sedation medicines used during the test).    SYMPTOMS TO REPORT IMMEDIATELY: A gastroenterologist can be reached at any hour.  During normal business hours, 8:30 AM to 5:00 PM Monday through Friday, call [phone].  After hours and on weekends, please call the GI answering service at [phone] who will take a message and have the physician on call contact you.   Following lower endoscopy (colonoscopy or flexible sigmoidoscopy):  Excessive amounts of blood in the stool  Significant tenderness or worsening of abdominal pains  Swelling of the abdomen that is new, acute  Fever of 100F or higher   FOLLOW UP: If any biopsies were taken you will be contacted by phone or by letter within the next 1-3 weeks.  Call your gastroenterologist if you have not heard about the biopsies in 3 weeks.  Our staff will call the home number listed on your records the next business day following your procedure to check on you and address any questions or concerns that you may have at that time regarding the information given to you following your procedure. This is a courtesy call and so if there  is no answer at the home number and we have not heard from you through the emergency physician on call, we will assume that you have returned to your regular daily activities without incident.  SIGNATURES/CONFIDENTIALITY: You and/or your care partner have signed paperwork which will be entered into your electronic medical record.  These signatures attest to the fact that that the information above on your After Visit Summary has been reviewed and is understood.  Full responsibility of the confidentiality of this discharge information lies with you and/or your care-partner.   Resume medications. Information given on polyps, hemorrhoids and high fiber diet with discharge instructions.

## 2012-01-23 ENCOUNTER — Telehealth: Payer: Self-pay | Admitting: *Deleted

## 2012-01-23 NOTE — Telephone Encounter (Signed)
  Follow up Call-  Call back number 01/22/2012  Post procedure Call Back phone  # [phone]  Permission to leave phone message Yes     [facility]

## 2012-01-24 ENCOUNTER — Telehealth: Payer: Self-pay | Admitting: *Deleted

## 2012-01-24 ENCOUNTER — Telehealth: Payer: Self-pay | Admitting: Gastroenterology
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
## 2012-01-24 IMAGING — CR DG CHEST 1V PORT
1 series · 1 of 1 positions shown · non-contrast
Comparison: Earlier today at [DATE]

CLINICAL DATA: hemodialysis catheter placement

PORTABLE CHEST - 1 VIEW

[view not recorded]
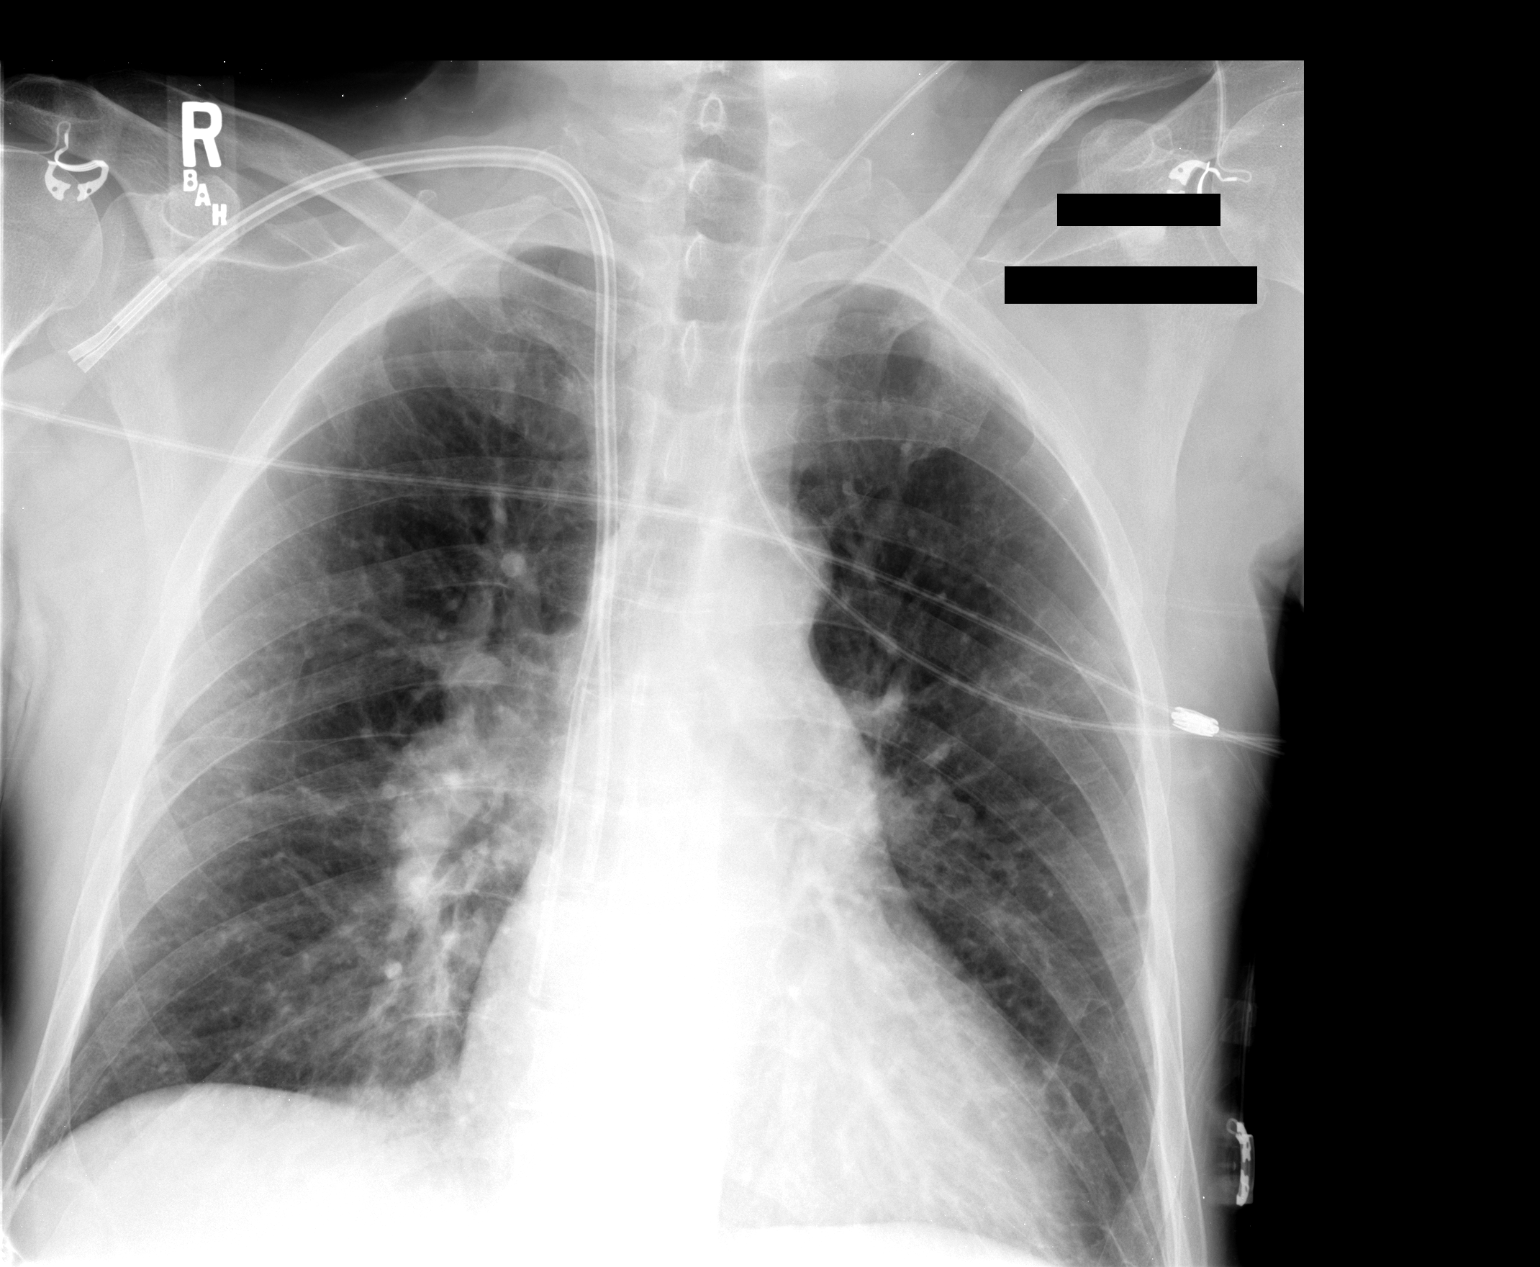

[1 of 1 positions shown; findings below may reference images not displayed]

FINDINGS: A right subclavian double-lumen catheter is now in place
with the tips at the cavoatrial junction and right atrium.  No
pneumothorax.  There is mild cardiomegaly and pulmonary vascular
cephalization, unchanged.
IMPRESSION: Catheter tips are at the cavoatrial junction and right atrium.  No
pneumothorax.

## 2012-01-24 NOTE — Telephone Encounter (Signed)
Called patient

## 2012-01-24 NOTE — Telephone Encounter (Signed)
He needs to go to the ER for evaluation. 

## 2012-01-24 NOTE — Telephone Encounter (Signed)
Patient returned my call and I told him I had left a message on his voicemail for which he did not check.  I advised him that Dr. Christella Hartigan recommended that he go straight to the E.D. To be evaluated.  He agreed to do so.

## 2012-01-24 NOTE — Telephone Encounter (Signed)
Called patient at the above number and there was no answer.  I left a message on his voicemail for him to go to E.D. And be evaluated per Dr. Christella Hartigan.  I left our number in admitting in case he needed to call us with further questions.

## 2012-01-24 NOTE — Telephone Encounter (Signed)
Dr. Christella Hartigan, here is the note you requested to be forwarded to you.

## 2012-01-26 ENCOUNTER — Inpatient Hospital Stay (HOSPITAL_COMMUNITY): Payer: Medicare Other

## 2012-01-26 ENCOUNTER — Inpatient Hospital Stay (HOSPITAL_COMMUNITY)
Admission: EM | Admit: 2012-01-26 | Discharge: 2012-02-21 | DRG: 004 | Disposition: A | Discharge status: Other Acute Inpatient Hospital | Payer: Medicare Other | Attending: Pulmonary Disease | Admitting: Pulmonary Disease

## 2012-01-26 ENCOUNTER — Emergency Department (HOSPITAL_COMMUNITY): Payer: Medicare Other

## 2012-01-26 DIAGNOSIS — N2581 Secondary hyperparathyroidism of renal origin: Secondary | ICD-10-CM | POA: Diagnosis present

## 2012-01-26 DIAGNOSIS — J9600 Acute respiratory failure, unspecified whether with hypoxia or hypercapnia: Secondary | ICD-10-CM | POA: Diagnosis not present

## 2012-01-26 DIAGNOSIS — F17200 Nicotine dependence, unspecified, uncomplicated: Secondary | ICD-10-CM | POA: Diagnosis present

## 2012-01-26 DIAGNOSIS — F1020 Alcohol dependence, uncomplicated: Secondary | ICD-10-CM | POA: Diagnosis present

## 2012-01-26 DIAGNOSIS — J9601 Acute respiratory failure with hypoxia: Secondary | ICD-10-CM | POA: Diagnosis present

## 2012-01-26 DIAGNOSIS — R7881 Bacteremia: Secondary | ICD-10-CM

## 2012-01-26 DIAGNOSIS — D61818 Other pancytopenia: Secondary | ICD-10-CM | POA: Diagnosis present

## 2012-01-26 DIAGNOSIS — E872 Acidosis, unspecified: Secondary | ICD-10-CM | POA: Diagnosis present

## 2012-01-26 DIAGNOSIS — F411 Generalized anxiety disorder: Secondary | ICD-10-CM | POA: Diagnosis present

## 2012-01-26 DIAGNOSIS — I120 Hypertensive chronic kidney disease with stage 5 chronic kidney disease or end stage renal disease: Secondary | ICD-10-CM | POA: Diagnosis present

## 2012-01-26 DIAGNOSIS — D696 Thrombocytopenia, unspecified: Secondary | ICD-10-CM | POA: Diagnosis present

## 2012-01-26 DIAGNOSIS — E875 Hyperkalemia: Secondary | ICD-10-CM | POA: Diagnosis present

## 2012-01-26 DIAGNOSIS — D62 Acute posthemorrhagic anemia: Secondary | ICD-10-CM | POA: Diagnosis not present

## 2012-01-26 DIAGNOSIS — A409 Streptococcal sepsis, unspecified: Secondary | ICD-10-CM | POA: Diagnosis not present

## 2012-01-26 DIAGNOSIS — J449 Chronic obstructive pulmonary disease, unspecified: Secondary | ICD-10-CM | POA: Diagnosis present

## 2012-01-26 DIAGNOSIS — A4152 Sepsis due to Pseudomonas: Principal | ICD-10-CM | POA: Diagnosis not present

## 2012-01-26 DIAGNOSIS — R55 Syncope and collapse: Secondary | ICD-10-CM | POA: Diagnosis not present

## 2012-01-26 DIAGNOSIS — G629 Polyneuropathy, unspecified: Secondary | ICD-10-CM

## 2012-01-26 DIAGNOSIS — E46 Unspecified protein-calorie malnutrition: Secondary | ICD-10-CM | POA: Diagnosis not present

## 2012-01-26 DIAGNOSIS — Z9119 Patient's noncompliance with other medical treatment and regimen: Secondary | ICD-10-CM

## 2012-01-26 DIAGNOSIS — K861 Other chronic pancreatitis: Secondary | ICD-10-CM

## 2012-01-26 DIAGNOSIS — B1920 Unspecified viral hepatitis C without hepatic coma: Secondary | ICD-10-CM | POA: Diagnosis present

## 2012-01-26 DIAGNOSIS — J690 Pneumonitis due to inhalation of food and vomit: Secondary | ICD-10-CM | POA: Diagnosis not present

## 2012-01-26 DIAGNOSIS — N186 End stage renal disease: Secondary | ICD-10-CM | POA: Diagnosis present

## 2012-01-26 DIAGNOSIS — A0472 Enterocolitis due to Clostridium difficile, not specified as recurrent: Secondary | ICD-10-CM

## 2012-01-26 DIAGNOSIS — D638 Anemia in other chronic diseases classified elsewhere: Secondary | ICD-10-CM | POA: Diagnosis present

## 2012-01-26 DIAGNOSIS — R1084 Generalized abdominal pain: Secondary | ICD-10-CM | POA: Diagnosis present

## 2012-01-26 DIAGNOSIS — K648 Other hemorrhoids: Secondary | ICD-10-CM | POA: Diagnosis present

## 2012-01-26 DIAGNOSIS — F1910 Other psychoactive substance abuse, uncomplicated: Secondary | ICD-10-CM

## 2012-01-26 DIAGNOSIS — F1010 Alcohol abuse, uncomplicated: Secondary | ICD-10-CM

## 2012-01-26 DIAGNOSIS — G92 Toxic encephalopathy: Secondary | ICD-10-CM | POA: Diagnosis not present

## 2012-01-26 DIAGNOSIS — D126 Benign neoplasm of colon, unspecified: Secondary | ICD-10-CM | POA: Diagnosis present

## 2012-01-26 DIAGNOSIS — E871 Hypo-osmolality and hyponatremia: Secondary | ICD-10-CM | POA: Diagnosis present

## 2012-01-26 DIAGNOSIS — J4489 Other specified chronic obstructive pulmonary disease: Secondary | ICD-10-CM | POA: Diagnosis present

## 2012-01-26 DIAGNOSIS — D649 Anemia, unspecified: Secondary | ICD-10-CM

## 2012-01-26 DIAGNOSIS — Z91199 Patient's noncompliance with other medical treatment and regimen due to unspecified reason: Secondary | ICD-10-CM

## 2012-01-26 DIAGNOSIS — E876 Hypokalemia: Secondary | ICD-10-CM | POA: Diagnosis present

## 2012-01-26 DIAGNOSIS — R6520 Severe sepsis without septic shock: Secondary | ICD-10-CM | POA: Diagnosis not present

## 2012-01-26 DIAGNOSIS — A401 Sepsis due to streptococcus, group B: Secondary | ICD-10-CM

## 2012-01-26 DIAGNOSIS — I509 Heart failure, unspecified: Secondary | ICD-10-CM | POA: Diagnosis present

## 2012-01-26 DIAGNOSIS — A419 Sepsis, unspecified organism: Secondary | ICD-10-CM | POA: Diagnosis not present

## 2012-01-26 DIAGNOSIS — K644 Residual hemorrhoidal skin tags: Secondary | ICD-10-CM | POA: Diagnosis present

## 2012-01-26 DIAGNOSIS — S2249XA Multiple fractures of ribs, unspecified side, initial encounter for closed fracture: Secondary | ICD-10-CM | POA: Diagnosis present

## 2012-01-26 DIAGNOSIS — I469 Cardiac arrest, cause unspecified: Secondary | ICD-10-CM

## 2012-01-26 DIAGNOSIS — R570 Cardiogenic shock: Secondary | ICD-10-CM | POA: Diagnosis present

## 2012-01-26 DIAGNOSIS — G929 Unspecified toxic encephalopathy: Secondary | ICD-10-CM | POA: Diagnosis not present

## 2012-01-26 DIAGNOSIS — K279 Peptic ulcer, site unspecified, unspecified as acute or chronic, without hemorrhage or perforation: Secondary | ICD-10-CM

## 2012-01-26 DIAGNOSIS — Y849 Medical procedure, unspecified as the cause of abnormal reaction of the patient, or of later complication, without mention of misadventure at the time of the procedure: Secondary | ICD-10-CM | POA: Diagnosis present

## 2012-01-26 DIAGNOSIS — G589 Mononeuropathy, unspecified: Secondary | ICD-10-CM | POA: Diagnosis not present

## 2012-01-26 DIAGNOSIS — Z992 Dependence on renal dialysis: Secondary | ICD-10-CM

## 2012-01-26 DIAGNOSIS — T17308A Unspecified foreign body in larynx causing other injury, initial encounter: Secondary | ICD-10-CM | POA: Diagnosis not present

## 2012-01-26 DIAGNOSIS — IMO0002 Reserved for concepts with insufficient information to code with codable children: Secondary | ICD-10-CM | POA: Diagnosis not present

## 2012-01-26 LAB — URINE MICROSCOPIC-ADD ON

## 2012-01-26 LAB — DIFFERENTIAL
Basophils Absolute: 0 10*3/uL (ref 0.0–0.1)
Eosinophils Relative: 0 % (ref 0–5)
Lymphocytes Relative: 18 % (ref 12–46)
Lymphocytes Relative: 7 % — ABNORMAL LOW (ref 12–46)
Lymphs Abs: 1.6 10*3/uL (ref 0.7–4.0)
Monocytes Absolute: 0.3 10*3/uL (ref 0.1–1.0)
Monocytes Relative: 4 % (ref 3–12)
Neutro Abs: 7.1 10*3/uL (ref 1.7–7.7)
Neutro Abs: 6.6 10*3/uL (ref 1.7–7.7)

## 2012-01-26 LAB — COMPREHENSIVE METABOLIC PANEL
Albumin: 2.2 g/dL — ABNORMAL LOW (ref 3.5–5.2)
Alkaline Phosphatase: 102 U/L (ref 39–117)
BUN: 118 mg/dL — ABNORMAL HIGH (ref 6–23)
CO2: 13 mEq/L — ABNORMAL LOW (ref 19–32)
Chloride: 90 mEq/L — ABNORMAL LOW (ref 96–112)
Creatinine, Ser: 13.28 mg/dL — ABNORMAL HIGH (ref 0.50–1.35)
GFR calc non Af Amer: 4 mL/min — ABNORMAL LOW (ref >90)
Potassium: 5.1 mEq/L (ref 3.5–5.1)
Total Bilirubin: 0.2 mg/dL — ABNORMAL LOW (ref 0.3–1.2)

## 2012-01-26 LAB — POCT I-STAT, CHEM 8
BUN: >140 mg/dL — ABNORMAL HIGH (ref 6–23)
Chloride: 96 mEq/L (ref 96–112)
Creatinine, Ser: 14.1 mg/dL — ABNORMAL HIGH (ref 0.50–1.35)
Potassium: 6.4 mEq/L (ref 3.5–5.1)
Sodium: 123 mEq/L — ABNORMAL LOW (ref 135–145)

## 2012-01-26 LAB — URINALYSIS, ROUTINE W REFLEX MICROSCOPIC
Glucose, UA: NEGATIVE mg/dL
Protein, ur: >300 mg/dL — AB
Specific Gravity, Urine: 1.011 (ref 1.005–1.030)
pH: 6.5 (ref 5.0–8.0)

## 2012-01-26 LAB — POCT I-STAT 3, ART BLOOD GAS (G3+)
Acid-base deficit: 15 mmol/L — ABNORMAL HIGH (ref 0.0–2.0)
Acid-base deficit: 18 mmol/L — ABNORMAL HIGH (ref 0.0–2.0)
Bicarbonate: 14.3 mEq/L — ABNORMAL LOW (ref 20.0–24.0)
O2 Saturation: 100 %
Patient temperature: 96.4
TCO2: 16 mmol/L (ref 0–100)
pO2, Arterial: 119 mmHg — ABNORMAL HIGH (ref 80.0–100.0)

## 2012-01-26 LAB — PROTIME-INR
INR: 1.24 (ref 0.00–1.49)
Prothrombin Time: 15.9 seconds — ABNORMAL HIGH (ref 11.6–15.2)

## 2012-01-26 LAB — PHOSPHORUS: Phosphorus: 12.8 mg/dL — ABNORMAL HIGH (ref 2.3–4.6)

## 2012-01-26 LAB — CBC
HCT: 30.3 % — ABNORMAL LOW (ref 39.0–52.0)
Hemoglobin: 11.6 g/dL — ABNORMAL LOW (ref 13.0–17.0)
Hemoglobin: 10.3 g/dL — ABNORMAL LOW (ref 13.0–17.0)
MCH: 32.8 pg (ref 26.0–34.0)
MCHC: 33.9 g/dL (ref 30.0–36.0)
MCV: 95.9 fL (ref 78.0–100.0)
RDW: 15.9 % — ABNORMAL HIGH (ref 11.5–15.5)
WBC: 7.3 10*3/uL (ref 4.0–10.5)

## 2012-01-26 LAB — GLUCOSE, CAPILLARY

## 2012-01-26 LAB — MAGNESIUM: Magnesium: 1.9 mg/dL (ref 1.5–2.5)

## 2012-01-26 LAB — AMYLASE: Amylase: 140 U/L — ABNORMAL HIGH (ref 0–105)

## 2012-01-26 LAB — CARDIAC PANEL(CRET KIN+CKTOT+MB+TROPI)
CK, MB: 28.3 ng/mL (ref 0.3–4.0)
Troponin I: 0.65 ng/mL (ref <0.30)

## 2012-01-26 LAB — LACTIC ACID, PLASMA: Lactic Acid, Venous: 0.7 mmol/L (ref 0.5–2.2)

## 2012-01-26 MED ORDER — FENTANYL CITRATE 0.05 MG/ML IJ SOLN
100.0000 ug | Freq: Once | INTRAMUSCULAR | Status: AC
  Administered 2012-01-26: 100 ug via INTRAVENOUS

## 2012-01-26 MED ORDER — DEXTROSE 50 % IV SOLN
50.0000 mL | Freq: Once | INTRAVENOUS | Status: AC
  Administered 2012-01-26 (×2): 50 mL via INTRAVENOUS

## 2012-01-26 MED ORDER — HEPARIN SODIUM (PORCINE) 5000 UNIT/ML IJ SOLN
5000.0000 [IU] | Freq: Three times a day (TID) | INTRAMUSCULAR | Status: DC
  Filled 2012-01-26 (×2): qty 1

## 2012-01-26 MED ORDER — SODIUM CHLORIDE 0.9 % IV SOLN
INTRAVENOUS | Status: DC
  Administered 2012-01-26 – 2012-02-02 (×2): via INTRAVENOUS

## 2012-01-26 MED ORDER — CISATRACURIUM BOLUS VIA INFUSION
0.1000 mg/kg | Freq: Once | INTRAVENOUS | Status: AC
  Administered 2012-01-26: 7.4 mg via INTRAVENOUS
  Filled 2012-01-26: qty 8

## 2012-01-26 MED ORDER — DEXTROSE 50 % IV SOLN
INTRAVENOUS | Status: AC
  Administered 2012-01-26: 50 mL via INTRAVENOUS
  Filled 2012-01-26: qty 50

## 2012-01-26 MED ORDER — NEPRO/CARBSTEADY PO LIQD
237.0000 mL | ORAL | Status: DC | PRN
  Filled 2012-01-26: qty 237

## 2012-01-26 MED ORDER — ASPIRIN 81 MG PO CHEW
CHEWABLE_TABLET | ORAL | Status: AC
  Filled 2012-01-26: qty 4

## 2012-01-26 MED ORDER — SODIUM CHLORIDE 0.9 % IV SOLN: 100.0000 mL | INTRAVENOUS | Status: DC | PRN

## 2012-01-26 MED ORDER — SODIUM CHLORIDE 0.9 % IV SOLN
0.5000 ug/kg/min | INTRAVENOUS | Status: DC
  Administered 2012-01-26: 1.5 ug/kg/min via INTRAVENOUS
  Filled 2012-01-26: qty 20

## 2012-01-26 MED ORDER — SODIUM CHLORIDE 0.9 % IV SOLN
1.0000 ug/kg/min | INTRAVENOUS | Status: DC
  Administered 2012-01-26: 1.5 ug/kg/min via INTRAVENOUS
  Administered 2012-01-27 – 2012-01-28 (×2): 3 ug/kg/min via INTRAVENOUS
  Filled 2012-01-26 (×3): qty 20

## 2012-01-26 MED ORDER — DEXTROSE 50 % IV SOLN
INTRAVENOUS | Status: AC
  Filled 2012-01-26: qty 50

## 2012-01-26 MED ORDER — FENTANYL CITRATE 0.05 MG/ML IJ SOLN: 100.0000 ug | Freq: Once | INTRAMUSCULAR | Status: AC | PRN

## 2012-01-26 MED ORDER — SODIUM CHLORIDE 0.9 % IV BOLUS (SEPSIS)
1000.0000 mL | Freq: Once | INTRAVENOUS | Status: AC
  Administered 2012-01-26: 1000 mL via INTRAVENOUS

## 2012-01-26 MED ORDER — HEPARIN SODIUM (PORCINE) 5000 UNIT/ML IJ SOLN
5000.0000 [IU] | Freq: Three times a day (TID) | INTRAMUSCULAR | Status: DC
  Administered 2012-01-26 – 2012-01-28 (×5): 5000 [IU] via SUBCUTANEOUS
  Filled 2012-01-26 (×8): qty 1

## 2012-01-26 MED ORDER — ARTIFICIAL TEARS OP OINT
1.0000 "application " | TOPICAL_OINTMENT | Freq: Three times a day (TID) | OPHTHALMIC | Status: DC
  Administered 2012-01-26 – 2012-01-28 (×5): 1 via OPHTHALMIC
  Filled 2012-01-26: qty 3.5

## 2012-01-26 MED ORDER — PIPERACILLIN-TAZOBACTAM IN DEX 2-0.25 GM/50ML IV SOLN
2.2500 g | Freq: Three times a day (TID) | INTRAVENOUS | Status: DC
  Administered 2012-01-26 – 2012-01-30 (×12): 2.25 g via INTRAVENOUS
  Filled 2012-01-26 (×14): qty 50

## 2012-01-26 MED ORDER — NOREPINEPHRINE BITARTRATE 1 MG/ML IJ SOLN
2.0000 ug/min | INTRAVENOUS | Status: DC
  Filled 2012-01-26: qty 4

## 2012-01-26 MED ORDER — HEPARIN SODIUM (PORCINE) 1000 UNIT/ML DIALYSIS: 1000.0000 [IU] | INTRAMUSCULAR | Status: DC | PRN

## 2012-01-26 MED ORDER — NOREPINEPHRINE BITARTRATE 1 MG/ML IJ SOLN
2.0000 ug/min | INTRAVENOUS | Status: DC
  Administered 2012-01-26: 10 ug/min via INTRAVENOUS
  Filled 2012-01-26: qty 4

## 2012-01-26 MED ORDER — SODIUM CHLORIDE 0.9 % IV SOLN
10.0000 ug/h | INTRAVENOUS | Status: DC
  Filled 2012-01-26: qty 50

## 2012-01-26 MED ORDER — ALTEPLASE 2 MG IJ SOLR
2.0000 mg | Freq: Once | INTRAMUSCULAR | Status: AC | PRN
  Filled 2012-01-26: qty 2

## 2012-01-26 MED ORDER — CHLORHEXIDINE GLUCONATE 0.12 % MT SOLN
OROMUCOSAL | Status: AC
  Administered 2012-01-26: 15 mL
  Filled 2012-01-26: qty 15

## 2012-01-26 MED ORDER — DEXTROSE 10 % IV SOLN
INTRAVENOUS | Status: DC
  Administered 2012-01-26 – 2012-01-27 (×2): via INTRAVENOUS

## 2012-01-26 MED ORDER — PENTAFLUOROPROP-TETRAFLUOROETH EX AERO: 1.0000 "application " | INHALATION_SPRAY | CUTANEOUS | Status: DC | PRN

## 2012-01-26 MED ORDER — DEXTROSE 5 % IV SOLN
Freq: Once | INTRAVENOUS | Status: AC
  Administered 2012-01-26: 20:00:00 via INTRAVENOUS

## 2012-01-26 MED ORDER — CISATRACURIUM BOLUS VIA INFUSION
0.0500 mg/kg | Freq: Once | INTRAVENOUS | Status: AC | PRN
  Filled 2012-01-26: qty 4

## 2012-01-26 MED ORDER — SODIUM CHLORIDE 0.9 % IV SOLN
1.0000 g | Freq: Once | INTRAVENOUS | Status: DC
  Administered 2012-01-26: 1 g via INTRAVENOUS
  Filled 2012-01-26: qty 10

## 2012-01-26 MED ORDER — SODIUM CHLORIDE 0.9 % IV SOLN
Freq: Once | INTRAVENOUS | Status: AC
  Administered 2012-01-26: 20:00:00 via INTRAVENOUS

## 2012-01-26 MED ORDER — INSULIN ASPART 100 UNIT/ML ~~LOC~~ SOLN
SUBCUTANEOUS | Status: AC
  Administered 2012-01-26: 18:00:00
  Filled 2012-01-26: qty 3

## 2012-01-26 MED ORDER — LIDOCAINE-PRILOCAINE 2.5-2.5 % EX CREA: 1.0000 "application " | TOPICAL_CREAM | CUTANEOUS | Status: DC | PRN

## 2012-01-26 MED ORDER — DEXTROSE 50 % IV SOLN
INTRAVENOUS | Status: AC
  Administered 2012-01-26: 50 mL
  Filled 2012-01-26: qty 50

## 2012-01-26 MED ORDER — FENTANYL CITRATE 0.05 MG/ML IJ SOLN
25.0000 ug/h | INTRAMUSCULAR | Status: DC
  Administered 2012-01-26: 50 ug/h via INTRAVENOUS
  Administered 2012-01-27 – 2012-01-28 (×2): 300 ug/h via INTRAVENOUS
  Filled 2012-01-26 (×4): qty 50

## 2012-01-26 MED ORDER — DEXTROSE 5 % IV SOLN
Freq: Once | INTRAVENOUS | Status: AC
  Administered 2012-01-26: 125 mL via INTRAVENOUS

## 2012-01-26 MED ORDER — SODIUM BICARBONATE 8.4 % IV SOLN
50.0000 meq | Freq: Once | INTRAVENOUS | Status: AC
  Administered 2012-01-26: 50 meq via INTRAVENOUS

## 2012-01-26 MED ORDER — INSULIN ASPART 100 UNIT/ML ~~LOC~~ SOLN: 0.0000 [IU] | SUBCUTANEOUS | Status: DC

## 2012-01-26 MED ORDER — SODIUM BICARBONATE 8.4 % IV SOLN
INTRAVENOUS | Status: AC
  Administered 2012-01-26: 07:00:00
  Filled 2012-01-26: qty 50

## 2012-01-26 MED ORDER — SODIUM CHLORIDE 0.9 % IV SOLN
250.0000 mL | INTRAVENOUS | Status: DC | PRN
  Administered 2012-02-05: 22:00:00 via INTRAVENOUS

## 2012-01-26 MED ORDER — SODIUM CHLORIDE 0.9 % IV SOLN
INTRAVENOUS | Status: DC
  Administered 2012-01-26: 22:00:00 via INTRAVENOUS

## 2012-01-26 MED ORDER — VANCOMYCIN HCL 1000 MG IV SOLR
1500.0000 mg | Freq: Once | INTRAVENOUS | Status: AC
  Administered 2012-01-27: 1500 mg via INTRAVENOUS
  Filled 2012-01-26: qty 1500

## 2012-01-26 MED ORDER — ASPIRIN 300 MG RE SUPP
300.0000 mg | RECTAL | Status: AC
  Administered 2012-01-26: 300 mg via RECTAL
  Filled 2012-01-26 (×3): qty 1

## 2012-01-26 MED ORDER — MIDAZOLAM HCL 5 MG/ML IJ SOLN
1.0000 mg/h | INTRAMUSCULAR | Status: DC
  Administered 2012-01-26: 2 mg/h via INTRAVENOUS
  Filled 2012-01-26: qty 10

## 2012-01-26 MED ORDER — CALCIUM CHLORIDE 10 % IV SOLN
1.0000 g | Freq: Once | INTRAVENOUS | Status: AC
  Administered 2012-01-26: 1 g via INTRAVENOUS

## 2012-01-26 MED ORDER — PROPOFOL 10 MG/ML IV EMUL: 5.0000 ug/kg/min | INTRAVENOUS | Status: DC

## 2012-01-26 MED ORDER — SODIUM CHLORIDE 0.9 % IV SOLN
Freq: Once | INTRAVENOUS | Status: AC
  Administered 2012-01-26: 1000 mL via INTRAVENOUS

## 2012-01-26 MED ORDER — ASPIRIN 81 MG PO CHEW
CHEWABLE_TABLET | ORAL | Status: AC
  Filled 2012-01-26: qty 1

## 2012-01-26 MED ORDER — PANTOPRAZOLE SODIUM 40 MG IV SOLR
40.0000 mg | Freq: Every day | INTRAVENOUS | Status: DC
  Administered 2012-01-26 – 2012-01-30 (×5): 40 mg via INTRAVENOUS
  Filled 2012-01-26 (×6): qty 40

## 2012-01-26 MED ORDER — SODIUM CHLORIDE 0.9 % IV SOLN
1.0000 mg/h | INTRAVENOUS | Status: DC
  Administered 2012-01-26: 2 mg/h via INTRAVENOUS
  Administered 2012-01-27 – 2012-01-28 (×2): 4 mg/h via INTRAVENOUS
  Administered 2012-01-28: 8 mg/h via INTRAVENOUS
  Administered 2012-01-28: 6 mg/h via INTRAVENOUS
  Filled 2012-01-26 (×5): qty 10

## 2012-01-26 MED ORDER — FENTANYL BOLUS VIA INFUSION
50.0000 ug | INTRAVENOUS | Status: DC | PRN
  Administered 2012-01-28 (×2): 50 ug via INTRAVENOUS
  Filled 2012-01-26: qty 50

## 2012-01-26 MED ORDER — DEXTROSE 10 % IV SOLN
INTRAVENOUS | Status: DC | PRN
  Filled 2012-01-26: qty 1000

## 2012-01-26 MED ORDER — LIDOCAINE HCL (PF) 1 % IJ SOLN: 5.0000 mL | INTRAMUSCULAR | Status: DC | PRN

## 2012-01-26 MED ORDER — SODIUM CHLORIDE 0.9 % IV SOLN
10.0000 ug/h | INTRAVENOUS | Status: AC
  Filled 2012-01-26: qty 50

## 2012-01-26 MED ORDER — ALBUTEROL SULFATE HFA 108 (90 BASE) MCG/ACT IN AERS
4.0000 | INHALATION_SPRAY | RESPIRATORY_TRACT | Status: DC | PRN
  Filled 2012-01-26: qty 6.7

## 2012-01-26 NOTE — Progress Notes (Signed)
Responded to pg to ED for pt with CPR.  Provided pastoral presence to staff while pt examined and treated.  Pt intubated.  Per EMS, family was "some time away".  Remained present in ED but family had not arrived.  Will follow-up as needed or requested.
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
## 2012-01-26 NOTE — ED Provider Notes (Signed)
I saw and evaluated the patient, reviewed the resident's note and I agree with the findings and plan. The patient is a 56 year old, male, who gets dialysis who presented to the emergency department after he collapsed.  He called EMS and reported chest pain.  When they arrived.  He had a PEA.  Episode, and they intubated him gave him calcium chloride, and brought him to the emergency department for evaluation.  Upon arrival, he was unresponsive, and intubated.  He had a pulse and mild tachycardia.  And I had been established in his right tibia.  We gave him additional calcium, glucose, and insulin, and bicarbonate, because he had persistent T-wave elevations on his EKG, with mild widening of the QRS complex.  Chest x-ray, and laboratory testing, was performed in consultation was made to the critical care physicians, and nephrologist.  The nephrologist agreed to perform emergent dialysis and critical care physician requested that the optic.  Son be performed.  CRITICAL CARE Performed by: Nicholes Stairs   Total critical care time: 30 min  Critical care time was exclusive of separately billable procedures and treating other patients.  Critical care was necessary to treat or prevent imminent or life-threatening deterioration.  Critical care was time spent personally by me on the following activities: development of treatment plan with patient and/or surrogate as well as nursing, discussions with consultants, evaluation of patient's response to treatment, examination of patient, obtaining history from patient or surrogate, ordering and performing treatments and interventions, ordering and review of laboratory studies, ordering and review of radiographic studies, pulse oximetry and re-evaluation of patient's condition.  Cheri Guppy, MD 01/26/12 [phone]
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
## 2012-01-26 NOTE — ED Notes (Signed)
Pt. Called ems for cp; when Medic arrived pt. Unresponsive, PEA; [pt. Intubated and assist breathing; pulses came back and then back to PEA. EMS continued CPR; after pulses returned, ECG showed peak T-waves, and they gave one amp of calcium chloride through an IO (rt. Lower leg); upon arrival pulses present, ST and assist breathing. Pt. Intubated with 8.0 ET/26 at the lip. Pt on LSB. Pt. Is a dialysis pt. It is believed he has missed dialysis.
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
## 2012-01-26 NOTE — Progress Notes (Signed)
eLink Physician-Brief Progress Note Patient Name: [name] DOB: January 19, 1956 MRN: [id number]  Date of Service  01/26/2012   HPI/Events of Note  cxr review   eICU Interventions  Ok to use cvl  Advance et tube by 2cm   Intervention Category Intermediate Interventions: Diagnostic test evaluation  Daina Cara 01/26/2012, 9:26 PM

## 2012-01-26 NOTE — ED Notes (Signed)
Dr. Noe Gens aware of pts.dec. In bp, inc. Temp, no blood in og tube, abd. Soft, weak pulses extremities, skin color ashy/pink, and good urine outputt; pt. Placed in trendelenburg

## 2012-01-26 NOTE — ED Notes (Signed)
cbg 33...notified nurse Shary Key

## 2012-01-26 NOTE — Procedures (Signed)
Arterial Catheter Insertion Procedure Note TOU HAYNER [id number] [birth year]  Procedure: Insertion of Arterial Catheter  Indications: Blood pressure monitoring  Procedure Details Consent: Unable to obtain consent because of emergent medical necessity. Time Out: Verified patient identification, verified procedure, site/side was marked, verified correct patient position, special equipment/implants available, medications/allergies/relevent history reviewed, required imaging and test results available.  Performed  Maximum sterile technique was used including antiseptics, gloves, gown, hand hygiene and sheet. Skin prep: Chlorhexidine; local anesthetic administered 20 gauge catheter was inserted into right radial artery using the Seldinger technique.  Evaluation Blood flow good; BP tracing good. Complications: No apparent complications.   Fidela Salisbury 01/26/2012

## 2012-01-27 ENCOUNTER — Encounter (HOSPITAL_COMMUNITY): Payer: Self-pay

## 2012-01-27 DIAGNOSIS — J9601 Acute respiratory failure with hypoxia: Secondary | ICD-10-CM | POA: Diagnosis present

## 2012-01-27 DIAGNOSIS — E872 Acidosis: Secondary | ICD-10-CM

## 2012-01-27 DIAGNOSIS — E875 Hyperkalemia: Secondary | ICD-10-CM

## 2012-01-27 DIAGNOSIS — I059 Rheumatic mitral valve disease, unspecified: Secondary | ICD-10-CM

## 2012-01-27 DIAGNOSIS — R55 Syncope and collapse: Secondary | ICD-10-CM

## 2012-01-27 DIAGNOSIS — K861 Other chronic pancreatitis: Secondary | ICD-10-CM

## 2012-01-27 DIAGNOSIS — J9600 Acute respiratory failure, unspecified whether with hypoxia or hypercapnia: Secondary | ICD-10-CM

## 2012-01-27 DIAGNOSIS — R079 Chest pain, unspecified: Secondary | ICD-10-CM

## 2012-01-27 LAB — BASIC METABOLIC PANEL
BUN: 61 mg/dL — ABNORMAL HIGH (ref 6–23)
BUN: 32 mg/dL — ABNORMAL HIGH (ref 6–23)
BUN: 52 mg/dL — ABNORMAL HIGH (ref 6–23)
BUN: 54 mg/dL — ABNORMAL HIGH (ref 6–23)
CO2: 12 mEq/L — ABNORMAL LOW (ref 19–32)
CO2: 18 mEq/L — ABNORMAL LOW (ref 19–32)
Calcium: 7.8 mg/dL — ABNORMAL LOW (ref 8.4–10.5)
Calcium: 7.7 mg/dL — ABNORMAL LOW (ref 8.4–10.5)
Calcium: 7.6 mg/dL — ABNORMAL LOW (ref 8.4–10.5)
Calcium: 7.8 mg/dL — ABNORMAL LOW (ref 8.4–10.5)
Calcium: 8 mg/dL — ABNORMAL LOW (ref 8.4–10.5)
Calcium: 7.9 mg/dL — ABNORMAL LOW (ref 8.4–10.5)
Chloride: 82 mEq/L — ABNORMAL LOW (ref 96–112)
Chloride: 93 mEq/L — ABNORMAL LOW (ref 96–112)
Creatinine, Ser: 4.1 mg/dL — ABNORMAL HIGH (ref 0.50–1.35)
Creatinine, Ser: 6.32 mg/dL — ABNORMAL HIGH (ref 0.50–1.35)
Creatinine, Ser: 6.81 mg/dL — ABNORMAL HIGH (ref 0.50–1.35)
Creatinine, Ser: 7.25 mg/dL — ABNORMAL HIGH (ref 0.50–1.35)
Creatinine, Ser: 7.3 mg/dL — ABNORMAL HIGH (ref 0.50–1.35)
Creatinine, Ser: 7.46 mg/dL — ABNORMAL HIGH (ref 0.50–1.35)
Creatinine, Ser: 7.53 mg/dL — ABNORMAL HIGH (ref 0.50–1.35)
GFR calc Af Amer: 8 mL/min — ABNORMAL LOW (ref >90)
GFR calc Af Amer: 17 mL/min — ABNORMAL LOW (ref >90)
GFR calc Af Amer: 10 mL/min — ABNORMAL LOW (ref >90)
GFR calc Af Amer: 9 mL/min — ABNORMAL LOW (ref >90)
GFR calc Af Amer: 8 mL/min — ABNORMAL LOW (ref >90)
GFR calc non Af Amer: 7 mL/min — ABNORMAL LOW (ref >90)
GFR calc non Af Amer: 15 mL/min — ABNORMAL LOW (ref >90)
GFR calc non Af Amer: 7 mL/min — ABNORMAL LOW (ref >90)
GFR calc non Af Amer: 7 mL/min — ABNORMAL LOW (ref >90)
GFR calc non Af Amer: 7 mL/min — ABNORMAL LOW (ref >90)
GFR calc non Af Amer: 7 mL/min — ABNORMAL LOW (ref >90)
Glucose, Bld: 104 mg/dL — ABNORMAL HIGH (ref 70–99)
Glucose, Bld: 151 mg/dL — ABNORMAL HIGH (ref 70–99)
Glucose, Bld: 187 mg/dL — ABNORMAL HIGH (ref 70–99)
Potassium: 2.5 mEq/L — CL (ref 3.5–5.1)
Potassium: 2.9 mEq/L — ABNORMAL LOW (ref 3.5–5.1)
Sodium: 134 mEq/L — ABNORMAL LOW (ref 135–145)
Sodium: 130 mEq/L — ABNORMAL LOW (ref 135–145)
Sodium: 131 mEq/L — ABNORMAL LOW (ref 135–145)
Sodium: 124 mEq/L — ABNORMAL LOW (ref 135–145)
Sodium: 130 mEq/L — ABNORMAL LOW (ref 135–145)

## 2012-01-27 LAB — GLUCOSE, CAPILLARY
Glucose-Capillary: 114 mg/dL — ABNORMAL HIGH (ref 70–99)
Glucose-Capillary: 118 mg/dL — ABNORMAL HIGH (ref 70–99)
Glucose-Capillary: 147 mg/dL — ABNORMAL HIGH (ref 70–99)
Glucose-Capillary: 92 mg/dL (ref 70–99)
Glucose-Capillary: 137 mg/dL — ABNORMAL HIGH (ref 70–99)
Glucose-Capillary: 144 mg/dL — ABNORMAL HIGH (ref 70–99)
Glucose-Capillary: 144 mg/dL — ABNORMAL HIGH (ref 70–99)
Glucose-Capillary: 138 mg/dL — ABNORMAL HIGH (ref 70–99)
Glucose-Capillary: 107 mg/dL — ABNORMAL HIGH (ref 70–99)

## 2012-01-27 LAB — BLOOD GAS, ARTERIAL
Acid-base deficit: 7.1 mmol/L — ABNORMAL HIGH (ref 0.0–2.0)
Bicarbonate: 18.2 mEq/L — ABNORMAL LOW (ref 20.0–24.0)
FIO2: 0.4 %
O2 Saturation: 97.1 %
pCO2 arterial: 31.5 mmHg — ABNORMAL LOW (ref 35.0–45.0)
pO2, Arterial: 88.8 mmHg (ref 80.0–100.0)

## 2012-01-27 LAB — CARDIAC PANEL(CRET KIN+CKTOT+MB+TROPI)
CK, MB: 34.8 ng/mL (ref 0.3–4.0)
CK, MB: 33.2 ng/mL (ref 0.3–4.0)
Total CK: 1165 U/L — ABNORMAL HIGH (ref 7–232)
Total CK: 1093 U/L — ABNORMAL HIGH (ref 7–232)
Troponin I: 0.58 ng/mL (ref <0.30)
Troponin I: 0.48 ng/mL (ref <0.30)

## 2012-01-27 LAB — CBC
Hemoglobin: 11.3 g/dL — ABNORMAL LOW (ref 13.0–17.0)
MCV: 94.6 fL (ref 78.0–100.0)
Platelets: 103 10*3/uL — ABNORMAL LOW (ref 150–400)
RBC: 3.49 MIL/uL — ABNORMAL LOW (ref 4.22–5.81)
WBC: 3.6 10*3/uL — ABNORMAL LOW (ref 4.0–10.5)

## 2012-01-27 LAB — MAGNESIUM: Magnesium: 1.6 mg/dL (ref 1.5–2.5)

## 2012-01-27 LAB — PHOSPHORUS: Phosphorus: 5.2 mg/dL — ABNORMAL HIGH (ref 2.3–4.6)

## 2012-01-27 MED ORDER — CHLORHEXIDINE GLUCONATE 0.12 % MT SOLN
15.0000 mL | Freq: Two times a day (BID) | OROMUCOSAL | Status: DC
  Administered 2012-01-27 – 2012-02-05 (×18): 15 mL via OROMUCOSAL
  Filled 2012-01-27 (×16): qty 15

## 2012-01-27 MED ORDER — BIOTENE DRY MOUTH MT LIQD
15.0000 mL | Freq: Four times a day (QID) | OROMUCOSAL | Status: DC
  Administered 2012-01-27 – 2012-02-05 (×39): 15 mL via OROMUCOSAL

## 2012-01-27 MED ORDER — POTASSIUM CHLORIDE 10 MEQ/100ML IV SOLN
INTRAVENOUS | Status: AC
  Filled 2012-01-27: qty 100

## 2012-01-27 MED ORDER — NOREPINEPHRINE BITARTRATE 1 MG/ML IJ SOLN
2.0000 ug/min | INTRAVENOUS | Status: DC
  Administered 2012-01-27: 7 ug/min via INTRAVENOUS
  Administered 2012-01-28: 10 ug/min via INTRAVENOUS
  Administered 2012-01-28: 6.4 ug/min via INTRAVENOUS
  Administered 2012-01-28: 25 ug/min via INTRAVENOUS
  Administered 2012-01-29: 8 ug/min via INTRAVENOUS
  Administered 2012-01-29: 25 ug/min via INTRAVENOUS
  Administered 2012-01-29: 20 ug/min via INTRAVENOUS
  Administered 2012-01-29: 10 ug/min via INTRAVENOUS
  Administered 2012-01-30: 6 ug/min via INTRAVENOUS
  Administered 2012-01-30: 5 ug/min via INTRAVENOUS
  Filled 2012-01-27 (×4): qty 16

## 2012-01-27 MED ORDER — CIPROFLOXACIN IN D5W 400 MG/200ML IV SOLN
400.0000 mg | Freq: Two times a day (BID) | INTRAVENOUS | Status: DC
  Administered 2012-01-27: 400 mg via INTRAVENOUS
  Filled 2012-01-27 (×3): qty 200

## 2012-01-27 MED ORDER — POTASSIUM CHLORIDE 20 MEQ/15ML (10%) PO LIQD
40.0000 meq | Freq: Once | ORAL | Status: AC
  Administered 2012-01-27: 40 meq
  Filled 2012-01-27: qty 30

## 2012-01-27 MED ORDER — POTASSIUM CHLORIDE 20 MEQ/15ML (10%) PO LIQD
ORAL | Status: AC
  Filled 2012-01-27: qty 30

## 2012-01-27 MED ORDER — POTASSIUM CHLORIDE 10 MEQ/100ML IV SOLN
10.0000 meq | Freq: Once | INTRAVENOUS | Status: AC
  Administered 2012-01-27: 10 meq via INTRAVENOUS

## 2012-01-27 NOTE — Progress Notes (Signed)
01/27/2012 11:52 PM Pt able to raise eyebrows when name called. Versed gtt increased to 6mg /hr. Bereket Gernert, Linnell Fulling

## 2012-01-27 NOTE — Care Management Note (Addendum)
    Page 1 of 1   02/18/2012     10:15:31 AM   CARE MANAGEMENT NOTE 02/18/2012  Patient:  [name], [name]   Account Number:  [id number]  Date Initiated:  01/27/2012  Documentation initiated by:  Junius Creamer  Subjective/Objective Assessment:   adm w pulessness, cpr at home     Action/Plan:   lives w stepson, pcp dr sidhu   Anticipated DC Date:     Anticipated DC Plan:    In-house referral  Clinical Social Worker      DC Planning Services  CM consult      Choice offered to / List presented to:             Status of service:   Medicare Important Message given?   (If response is "NO", the following Medicare IM given date fields will be blank) Date Medicare IM given:   Date Additional Medicare IM given:    Discharge Disposition:    Per UR Regulation:  Reviewed for med. necessity/level of care/duration of stay  If discussed at Long Length of Stay Meetings, dates discussed:   02/05/2012  02/11/2012  02/13/2012  02/18/2012    Comments:  7/16 10:12a debbie Kyrie Fludd rn,bsn spoke w dr Molli Knock and he would like ltac eval  for elidg, trying to reach fam about trach and will also need peg.  7/15 11:24a debbie Nyashia Raney rn,bsn back in icu on vent. iv amiod. to get trach. nse to ask md about ltac consult for future.  7/8 11:30a debbie dowellrn,bsn was reintubated on 7-6.  7/5 10:48a debbie Yicel Shannon rn,bsn spoke w kristine sw she is working on snf for pt.  7/3 12:18p debbie Thornton Dohrmann rn,bsn off vent on 7-2, anxious, stach today, daily heoodialysis. await pt starting to get up and will assist w dc needs.  7/1 13:23p debbie Ludie Hudon rn,bsn remains on vent.  6/27 12:11p debbie Alanys Godino rn,bsn remains on vent.  6/24 12:45p debbie Alverto Shedd rn,bsn [phone]
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
## 2012-01-27 NOTE — Progress Notes (Signed)
Patient ID: [name], male   DOB: 10/24/1955, 56 y.o.   MRN: [id number]  [facility] Progress Note    Subjective:   Intubated/sedated   Objective:   BP 133/75  Pulse 81  Temp 91.8 F (33.2 C) (Core (Comment))  Resp 24  Ht 6' (1.829 m)  Wt 73.53 kg (162 lb 1.7 oz)  BMI 21.99 kg/m2  SpO2 100%  Physical Exam: Gen:WD WN WM intubated and sedated CVS:RRR Resp:occ rhonchi o/w CTA MVH:QIONGE XBM:WUXLK pretib edema, LUE AVF +T/B  Labs: BMET  Lab 01/27/12 0800 01/27/12 0445 01/27/12 0345 01/27/12 0137 01/26/12 2345 01/26/12 2053 01/26/12 1754 01/26/12 1740  NA 131* 132* 130* 132* 134* 127* 123* --  K 3.1* 2.9* 2.6* 2.5* 2.8* 5.1 6.4* --  CL 93* 93* 92* 95* 96 90* 96 --  CO2 19 18* 18* 21 19 13* -- 12*  GLUCOSE 187* 151* 144* 122* 104* 135* 77 --  BUN 52* 49* 48* 32* 61* 118* >140* --  CREATININE 7.25* 6.81* 6.32* 4.10* 7.45* 13.28* 14.10* --  ALB -- -- -- -- -- -- -- --  CALCIUM 7.6* 7.8* 7.7* 8.1* 7.8* 7.8* -- 11.0*  PHOS -- 5.2* -- -- -- 12.8* -- --   CBC  Lab 01/27/12 0445 01/26/12 2053 01/26/12 1754 01/26/12 1734  WBC 3.6* 7.3 -- 8.9  NEUTROABS -- 6.6 -- 7.1  HGB 11.3* 10.3* 12.6* 11.6*  HCT 33.0* 30.3* 37.0* 34.2*  MCV 94.6 95.9 -- 96.6  PLT 103* 155 -- 210    @IMGRELPRIORS @ Medications:      . sodium chloride   Intravenous Once  . sodium chloride   Intravenous Once  . antiseptic oral rinse  15 mL Mouth Rinse QID  . artificial tears  1 application Both Eyes Q8H  . aspirin  300 mg Rectal NOW  . calcium chloride  1 g Intravenous Once  . chlorhexidine  15 mL Mouth/Throat BID  . chlorhexidine      . cisatracurium  0.1 mg/kg Intravenous Once  . dextrose   Intravenous Once  . dextrose   Intravenous Once  . dextrose  50 mL Intravenous Once  . dextrose      . dextrose      . fentaNYL infusion INTRAVENOUS  10 mcg/hr Intravenous To ER  . fentaNYL  100 mcg Intravenous Once  . heparin  5,000 Units Subcutaneous Q8H  . insulin aspart      .  insulin aspart  0-3 Units Subcutaneous Q4H  . pantoprazole (PROTONIX) IV  40 mg Intravenous QHS  . piperacillin-tazobactam (ZOSYN)  IV  2.25 g Intravenous Q8H  . potassium chloride  10 mEq Intravenous Once  . potassium chloride      . sodium bicarbonate  50 mEq Intravenous Once  . sodium chloride  1,000 mL Intravenous Once  . vancomycin  1,500 mg Intravenous Once  . DISCONTD: calcium chloride  IV  1 g Intravenous Once  . DISCONTD: cisatracurium (NIMBEX) infusion  0.5-10 mcg/kg/min Intravenous To ER  . DISCONTD: heparin  5,000 Units Subcutaneous Q8H  . DISCONTD: midazolam (VERSED) infusion  1 mg/hr Intravenous To ER  . DISCONTD: norepinephrine (LEVOPHED) Adult infusion  2-50 mcg/min Intravenous To ER     Assessment/ Plan:   1. PEA arrest- presumably due to hyperkalemia and metabolic acidosis due to nonadherence with HD vs Etoh induced arrythmias vs CAD 2. ESRD: normally MWF, off schedule.  Will follow 3. Anemia:resume epo if hgb <11 4. CKD-MBD:elevated post arrest- plan per  primary svc 5. Nutrition:poor due to alcoholism, cont with supplements 6. Cardiogenic shock post arrest:warming per PCCM.  Cards to eval today 7. Alcoholism- high risk for seizures, recommend withdrawal precautions once sedation is lightened 8. VDRF- per PCCM 9. Hyperkalemia- resolved post HD 10. H/o chronic pancreatitis and GIB- follow H/H, cont protonix  Zacheriah Stumpe A 01/27/2012, 10:18 AM

## 2012-01-27 NOTE — Progress Notes (Signed)
eLink Physician-Brief Progress Note Patient Name: [name] DOB: Dec 13, 1955 MRN: [id number]  Date of Service  01/27/2012   HPI/Events of Note   Aerobic blood cx positive for GPC  eICU Interventions  Restart vancomycin (stopped today) until speciated   Intervention Category Major Interventions: Infection - evaluation and management  [name] S. 01/27/2012, 3:31 PM

## 2012-01-27 NOTE — Clinical Social Work Psychosocial (Signed)
     Clinical Social Work Department BRIEF PSYCHOSOCIAL ASSESSMENT 01/27/2012  Patient:  [name], [name]     Account Number:  [id number]     Admit date:  01/26/2012  Clinical Social Worker:  Hulan Fray  Date/Time:  01/27/2012 02:16 PM  Referred by:  Physician  Date Referred:  01/27/2012 Referred for  Behavioral Health Issues   Other Referral:   Interview type:  Other - See comment Other interview type:   Unit RN    PSYCHOSOCIAL DATA Living Status:  FAMILY Admitted from facility:   Level of care:   Primary support name:  unknown Primary support relationship to patient:  NONE Degree of support available:   unknown    CURRENT CONCERNS Current Concerns  Other - See comment   Other Concerns:   Patient is currently intubated and sedated    SOCIAL WORK ASSESSMENT / PLAN Clinical Social Worker received referral for behavioral health issues. Patient is unable to participate in assessment due to being intubated and sedated.    CSW reviewed chart and patient has been non-compliant with dialysis and has polysubstance abuse issues. Per chart review patient is living with his his step-son. Patient receives HD MWF. Per chart review, during previous admissions, the unit CSW has made attempts to address the substance abuse, but patient was either in HD or going through DT's from alcohol abuse and unable to participate. CSW will continue to follow along.   Assessment/plan status:  Psychosocial Support/Ongoing Assessment of Needs Other assessment/ plan:   CSW will continue to follow   Information/referral to community resources:   patient is sedated    PATIENTS/FAMILYS RESPONSE TO PLAN OF CARE: Patient is currently sedated.

## 2012-01-27 NOTE — Progress Notes (Signed)
eLink Physician-Brief Progress Note Patient Name: [name] DOB: 12-12-1955 MRN: [id number]  Date of Service  01/27/2012   HPI/Events of Note     eICU Interventions  Kcl 10 meq x 1 run Will not replace more since ESRD & just dialysed   Intervention Category Intermediate Interventions: Electrolyte abnormality - evaluation and management  Rilan Eiland V. 01/27/2012, 5:19 AM

## 2012-01-27 NOTE — Progress Notes (Signed)
Pt has been anuric all shift. Pt was bladder scanned and had 47 mL. Foley was also flushed with 10 mL and only  10 mL was returned. Will continue to monitor.

## 2012-01-27 NOTE — Progress Notes (Signed)
01/27/2012 9:52 PM +BC called from [facility]:  Called to E-link Physician.  Gram neg rods in both bottles. Holland Nickson, Linnell Fulling

## 2012-01-27 NOTE — Procedures (Signed)
Central Venous Catheter Insertion Procedure Note [name] [id number] 1956/02/03  Procedure: Insertion of Central Venous Catheter Indications: Assessment of intravascular volume, Drug and/or fluid administration and Frequent blood sampling  Procedure Details Consent: Unable to obtain consent because of altered level of consciousness. Time Out: Verified patient identification, verified procedure, site/side was marked, verified correct patient position, special equipment/implants available, medications/allergies/relevent history reviewed, required imaging and test results available.  Performed  Maximum sterile technique was used including antiseptics, cap, gloves, gown, hand hygiene, mask and sheet. Skin prep: Chlorhexidine; local anesthetic administered A antimicrobial bonded/coated triple lumen catheter was placed in the left internal jugular vein using the Seldinger technique.  Evaluation Blood flow good Complications: No apparent complications Patient did tolerate procedure well. Chest X-ray ordered to verify placement.  CXR: normal.  Overton Mam, M.D. Pulmonary and Critical Care Medicine Call E-link with questions [phone] 01/27/2012, 2:00 AM 2

## 2012-01-27 NOTE — Progress Notes (Signed)
eLink Physician-Brief Progress Note Patient Name: [name] DOB: 1955-12-10 MRN: [id number]  Date of Service  01/27/2012   HPI/Events of Note   Most recent info on blood cx's - 2 of 2 GNR from art line, 1 of 2 GPC from C[facility].   eICU Interventions  Added back vanco, will now add cipro to zosyn to double cover GNR until speciation.    Intervention Category Major Interventions: Infection - evaluation and management  [name] S. 01/27/2012, 10:09 PM
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
## 2012-01-27 NOTE — Progress Notes (Signed)
eLink Physician-Brief Progress Note Patient Name: [name] DOB: 01-14-1956 MRN: [id number]  Date of Service  01/27/2012   HPI/Events of Note     eICU Interventions  Increased titration range on cisatracurium to 1-3 mcg/kg/min   Intervention Category Major Interventions: Infection - evaluation and management Intermediate Interventions: Medication change / dose adjustment  Laryn Venning S. 01/27/2012, 3:33 PM
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
## 2012-01-27 NOTE — Progress Notes (Signed)
  Echocardiogram 2D Echocardiogram has been performed.  Vitaly Wanat 01/27/2012, 10:10 AM

## 2012-01-28 ENCOUNTER — Encounter: Payer: Self-pay | Admitting: Gastroenterology

## 2012-01-28 LAB — BASIC METABOLIC PANEL
BUN: 59 mg/dL — ABNORMAL HIGH (ref 6–23)
Calcium: 7.9 mg/dL — ABNORMAL LOW (ref 8.4–10.5)
Calcium: 7.9 mg/dL — ABNORMAL LOW (ref 8.4–10.5)
Calcium: 8 mg/dL — ABNORMAL LOW (ref 8.4–10.5)
Calcium: 8.2 mg/dL — ABNORMAL LOW (ref 8.4–10.5)
Chloride: 91 mEq/L — ABNORMAL LOW (ref 96–112)
Creatinine, Ser: 7.5 mg/dL — ABNORMAL HIGH (ref 0.50–1.35)
Creatinine, Ser: 7.65 mg/dL — ABNORMAL HIGH (ref 0.50–1.35)
Creatinine, Ser: 7.69 mg/dL — ABNORMAL HIGH (ref 0.50–1.35)
GFR calc Af Amer: 8 mL/min — ABNORMAL LOW (ref >90)
GFR calc Af Amer: 8 mL/min — ABNORMAL LOW (ref >90)
GFR calc Af Amer: 8 mL/min — ABNORMAL LOW (ref >90)
GFR calc non Af Amer: 7 mL/min — ABNORMAL LOW (ref >90)
GFR calc non Af Amer: 7 mL/min — ABNORMAL LOW (ref >90)
GFR calc non Af Amer: 7 mL/min — ABNORMAL LOW (ref >90)
GFR calc non Af Amer: 7 mL/min — ABNORMAL LOW (ref >90)
Glucose, Bld: 103 mg/dL — ABNORMAL HIGH (ref 70–99)
Potassium: 3.5 mEq/L (ref 3.5–5.1)
Sodium: 130 mEq/L — ABNORMAL LOW (ref 135–145)

## 2012-01-28 LAB — URINE CULTURE
Colony Count: >=100000
Culture  Setup Time: 201306240240
Special Requests: NORMAL

## 2012-01-28 LAB — GLUCOSE, CAPILLARY
Glucose-Capillary: 97 mg/dL (ref 70–99)
Glucose-Capillary: 100 mg/dL — ABNORMAL HIGH (ref 70–99)
Glucose-Capillary: 27 mg/dL — CL (ref 70–99)
Glucose-Capillary: 37 mg/dL — CL (ref 70–99)
Glucose-Capillary: 101 mg/dL — ABNORMAL HIGH (ref 70–99)

## 2012-01-28 MED ORDER — FENTANYL CITRATE 0.05 MG/ML IJ SOLN: 50.0000 ug | INTRAMUSCULAR | Status: DC | PRN

## 2012-01-28 MED ORDER — SODIUM CHLORIDE 0.9 % IV BOLUS (SEPSIS)
1000.0000 mL | Freq: Once | INTRAVENOUS | Status: AC
  Administered 2012-01-28: 1000 mL via INTRAVENOUS

## 2012-01-28 MED ORDER — DEXTROSE 50 % IV SOLN
25.0000 mL | Freq: Once | INTRAVENOUS | Status: AC | PRN
  Administered 2012-01-28: 25 mL via INTRAVENOUS

## 2012-01-28 MED ORDER — MIDAZOLAM HCL 2 MG/2ML IJ SOLN
2.0000 mg | INTRAMUSCULAR | Status: DC | PRN
  Administered 2012-01-29 – 2012-01-30 (×6): 2 mg via INTRAVENOUS
  Administered 2012-01-31: 4 mg via INTRAVENOUS
  Administered 2012-01-31: 2 mg via INTRAVENOUS
  Administered 2012-01-31 – 2012-02-01 (×4): 4 mg via INTRAVENOUS
  Filled 2012-01-28: qty 4
  Filled 2012-01-28 (×7): qty 2
  Filled 2012-01-28 (×3): qty 4
  Filled 2012-01-28 (×2): qty 2

## 2012-01-28 MED ORDER — HEPARIN SODIUM (PORCINE) 5000 UNIT/ML IJ SOLN
5000.0000 [IU] | Freq: Three times a day (TID) | INTRAMUSCULAR | Status: DC
  Administered 2012-01-28 – 2012-01-30 (×6): 5000 [IU] via SUBCUTANEOUS
  Filled 2012-01-28 (×9): qty 1

## 2012-01-28 MED ORDER — DEXTROSE 50 % IV SOLN: 50.0000 mL | Freq: Once | INTRAVENOUS | Status: DC | PRN

## 2012-01-28 MED ORDER — HEPARIN SODIUM (PORCINE) 1000 UNIT/ML DIALYSIS
20.0000 [IU]/kg | INTRAMUSCULAR | Status: DC | PRN
  Administered 2012-01-29: 1500 [IU] via INTRAVENOUS_CENTRAL

## 2012-01-28 MED ORDER — DEXTROSE 50 % IV SOLN
INTRAVENOUS | Status: AC
  Administered 2012-01-28: 50 mL
  Filled 2012-01-28: qty 50

## 2012-01-28 MED ORDER — CIPROFLOXACIN IN D5W 400 MG/200ML IV SOLN
400.0000 mg | INTRAVENOUS | Status: DC
  Administered 2012-01-28: 400 mg via INTRAVENOUS
  Filled 2012-01-28: qty 200

## 2012-01-28 MED ORDER — FENTANYL CITRATE 0.05 MG/ML IJ SOLN
50.0000 ug | INTRAMUSCULAR | Status: DC | PRN
  Administered 2012-01-29 – 2012-02-05 (×6): 100 ug via INTRAVENOUS
  Filled 2012-01-28 (×5): qty 2

## 2012-01-28 MED ORDER — ACETAMINOPHEN 325 MG PO TABS
650.0000 mg | ORAL_TABLET | Freq: Four times a day (QID) | ORAL | Status: DC | PRN
  Filled 2012-01-28: qty 2

## 2012-01-28 MED ORDER — HEPARIN SODIUM (PORCINE) 1000 UNIT/ML IJ SOLN
5000.0000 [IU] | Freq: Three times a day (TID) | INTRAMUSCULAR | Status: DC
  Filled 2012-01-28 (×3): qty 5

## 2012-01-28 MED ORDER — DEXTROSE 50 % IV SOLN
INTRAVENOUS | Status: AC
  Filled 2012-01-28: qty 50

## 2012-01-28 MED ORDER — MIDAZOLAM HCL 2 MG/2ML IJ SOLN: 2.0000 mg | INTRAMUSCULAR | Status: DC | PRN

## 2012-01-28 NOTE — Progress Notes (Signed)
Name: [name] MRN: [id number] DOB: [birth year]    LOS: 2 Date of admit 01/26/2012  5:09 PM  PULMONARY / CRITICAL CARE MEDICINE  BRIEF  56 year old male with PMH relevant for HTN, COPD, Hep C, polysubstance abuse, chronic recurrent pancreatitis, PUD and ESRD on hemodialysis. He initially called EMS from home complaining of chest pain. At arrival EMS found him down in PEA arrest. He was intubated in the field, advanced CPR was initiated and return to spontaneous circulation was obtained after 5 to 6 minutes. Initial EKG showed peaked T waves and at arrival had K of 6.4. He received bicarbonate, calcium and glucose/insuline. At arrival was intubated, unresponsive even to painful stimuli, monitor showed sinus tachycardia.  He has an IO access placed in the field. No family is available to take more history. As per the ER, the patient had missed dialysis for the past week. PMH and other components of the H&P obtained from previous records.   KEY SOCIAL HX  - Patient's sister, [name] [phone]). Patient has brothers as well. Ms. Hylton  Most involved and admitted to patient problems with non compliance and substance abuse   EVENTS    SUBJECTIVE/OVERNIGHT/INTERVAL HX  Growing GNR in blood from aline and GPC from blood in CVC  Rewarming in progress  Vital Signs: Temp:  [91.2 F (32.9 C)-96.1 F (35.6 C)] 95.9 F (35.5 C) (06/25 0915) Pulse Rate:  [71-116] 113  (06/25 0915) Resp:  [15-26] 24  (06/25 0915) BP: (69-226)/(40-110) 87/50 mmHg (06/25 0905) SpO2:  [89 %-100 %] 95 % (06/25 0915) FiO2 (%):  [40 %] 40 % (06/25 0900) Weight:  [75.8 kg (167 lb 1.7 oz)] 75.8 kg (167 lb 1.7 oz) (06/25 0500)  Physical Examination: General:  Intubated, mechanically ventilated, no acute distress. Neuro:  Synchronous,BIS score < 40 under deep sedation and paralytic HEENT:  PERRL, pink conjunctivae, moist membranes. Neck:  Supple, no JVD   Cardiovascular:  RRR, no M/R/G Lungs:   Bilateral diminished air entry, coarse breath sounds bilaterally, no wheezing.  Abdomen:  Tense?, mildly distended, diminished bowel sounds but present. Musculoskeletal:  No pedal edema, cyanosis or clubbing. Skin:  No rash. Multiple skin superficial erosions, bruises and crusts.     ASSESSMENT AND PLAN: 56 year old male with PMH relevant for HTN, COPD, Hep C, polysubstance abuse, chronic recurrent pancreatitis, PUD and ESRD on hemodialysis. He initially called EMS from home complaining of chest pain. At arrival EMS found him down in PEA arrest. Advanced CPR was started with return to spontaneous circulation after 5 to 6 minutes. At arrival intubated and unresponsive. EKG showed sinus rhythm with peak T waves. Initial K was 6.4. Chest X ray showed cardiomegaly and pulmonary edema but no focal infiltrates.    Patient Active Problem List  Diagnosis  . Pancytopenia  . SUBSTANCE ABUSE, MULTIPLE  . HYPERTENSION, ESSENTIAL, UNCONTROLLED  . PEPTIC ULCER DISEASE  . Chronic recurrent pancreatitis  . End stage renal disease on dialysis  . Abdominal pain, generalized  . Alcohol abuse  . COPD (chronic obstructive pulmonary disease)  . Neuropathy  . Anxiety  . Syncope  . Protein malnutrition  . PEA (Pulseless electrical activity)  . Acute respiratory failure with hypoxia  . Hyperkalemia       PULMONARY  Lab 01/27/12 0520 01/26/12 2217 01/26/12 1723  PHART 7.354 6.988* 7.020*  PCO2ART 31.5* 58.6* 64.5*  PO2ART 88.8 119.0* 511.0*  HCO3 18.2* 14.3* 16.6*  O2SAT 97.1 96.0 100.0   Ventilator  Settings: Vent Mode:  [-] PRVC FiO2 (%):  [40 %] 40 % Set Rate:  [24 bmp] 24 bmp Vt Set:  [550 mL] 550 mL PEEP:  [5 cmH20] 5 cmH20 Plateau Pressure:  [21 cmH20-25 cmH20] 21 cmH20 CXR:  Devices in good posiiton ETT:  01/26/2012 >>    A:   1) Respiratory failure secondary to cardiac arrest 2) History of COPD 3) Respiratory and metabolic acidosis   on 01/28/12:  Not an SBT candidate due to  ongoing induced hypothermia; rewarming in progress  P:   1) Full vent support 3) Albuterol/ipatropium   CARDIOVASCULAR  Lab 01/27/12 1200 01/27/12 0445 01/26/12 2045  TROPONINI 0.48* 0.58* 0.65*  LATICACIDVEN -- -- 0.7  PROBNP -- -- 22037.0*   ECG:  Peak T waves, Sinus rhythm at admit on 6/23 -> normalized 6/24 Lines: Left IJ placed. Adequate position verified on chest X ray.  A:  1) PEA arrest. Unclear etiology, likely metabolic derrangements secondary to non compliance with dialysis and sepsis 2) History of hypertension 3) Mild bump in tropnin since admit probably secondary to sepsis  P:  - Hypothermia protocol to continue; rewarm later 01/28/12 - appreciate [employer] cards; recommends conservative Rx  RENAL  Lab 01/28/12 0738 01/28/12 0400 01/27/12 2340 01/27/12 1940 01/27/12 1630 01/27/12 0445 01/26/12 2053  NA 128* 130* 129* 130* 130* -- --  K 3.8 3.7 -- -- -- -- --  CL 91* 93* 92* 93* 93* -- --  CO2 18* 18* 18* 18* 19 -- --  BUN 61* 60* 59* 57* 57* -- --  CREATININE 7.65* 7.50* 7.50* 7.53* 7.46* -- --  CALCIUM 8.2* 7.9* 7.9* 7.9* 8.0* -- --  MG -- -- -- -- -- 1.6 1.9  PHOS -- -- -- -- -- 5.2* 12.8*   Intake/Output      06/24 0701 - 06/25 0700 06/25 0701 - 06/26 0700   I.V. (mL/kg) 1816 (24) 225.6 (3)   NG/GT 70 20   IV Piggyback 310    Total Intake(mL/kg) 2196 (29) 245.6 (3.2)   Urine (mL/kg/hr)     Other     Total Output 0    Net +2196 +245.6        Stool Occurrence 1 x     Foley:  Placed at arrival  A:   1) ESRD on hemodialysis 2) Hyperkalemia, hyponatremia at admit on 01/26/2012. S/p emergent HD on 01/26/12    -  P:   1) Nephrology input  2) Monitor CMP every two hrs: will not correct low K aggressively due to ESRD  GASTROINTESTINAL  Lab 01/26/12 2053  AST 42*  ALT 27  ALKPHOS 102  BILITOT 0.2*  PROT 5.8*  ALBUMIN 2.2*     A:  1) History of PUD but lactate normal at admit  P:   1) NGT in place 2) NPO for now 3) Protonix  IV  HEMATOLOGIC  Lab 01/27/12 0453 01/27/12 0445 01/26/12 2053 01/26/12 1754 01/26/12 1734  HGB -- 11.3* 10.3* 12.6* 11.6*  HCT -- 33.0* 30.3* 37.0* 34.2*  PLT -- 103* 155 -- 210  INR 1.12 -- 1.24 -- --  APTT 44* -- 42* -- --   A:  Anemic or critical illness P:   - PRBC for hgb </= 6.9gm%    - exceptions are   -  if ACS susepcted/confirmed then transfuse for hgb </= 8.0gm%,  or    -  If septic shock first 24h and scvo2 < 70% then transfuse for hgb </=  9.0gm%   - active bleeding with hemodynamic instability, then transfuse regardless of hemoglobin value   At at all times try to transfuse 1 unit prbc as possible with exception of active hemorrhage    INFECTIOUS  Lab 01/27/12 0445 01/26/12 2053 01/26/12 2045 01/26/12 1734  WBC 3.6* 7.3 -- 8.9  PROCALCITON -- -- 3.80 --   Cultures:  Results for orders placed during the hospital encounter of 01/26/12  CULTURE, BLOOD (ROUTINE X 2)     Status: Normal (Preliminary result)   Collection Time   01/26/12  8:50 PM      Component Value Range Status Comment   Specimen Description BLOOD   Final    Special Requests     Final    Value: LEFT IJ BOTTLES DRAWN AEROBIC AND ANAEROBIC 10CC EACH   Culture  Setup Time [id number]   Final    Culture     Final    Value: GROUP B STREP(S.AGALACTIAE)ISOLATED     Note: Gram Stain Report Called to,Read Back By and Verified With: Mariana Kaufman @ 1444 01/27/12 BY KRAWS   Report Status PENDING   Incomplete   URINE CULTURE     Status: Normal   Collection Time   01/26/12  9:39 PM      Component Value Range Status Comment   Specimen Description URINE, CATHETERIZED   Final    Special Requests Normal   Final    Culture  Setup Time [id number]   Final    Colony Count >=100,000 COLONIES/ML   Final    Culture     Final    Value: GROUP B STREP(S.AGALACTIAE)ISOLATED     Note: TESTING AGAINST S. AGALACTIAE NOT ROUTINELY PERFORMED DUE TO PREDICTABILITY OF AMP/PEN/VAN SUSCEPTIBILITY.   Report Status 01/28/2012  FINAL   Final   CULTURE, BLOOD (ROUTINE X 2)     Status: Normal (Preliminary result)   Collection Time   01/26/12 10:06 PM      Component Value Range Status Comment   Specimen Description BLOOD RIGHT RADIAL A-LINE DRAW   Final    Special Requests BOTTLES DRAWN AEROBIC AND ANAEROBIC [facility]   Final    Culture  Setup Time [id number]   Final    Culture     Final    Value: GRAM NEGATIVE RODS     Note: Gram Stain Report Called to,Read Back By and Verified With: ROSE COLLOM @ 2150 ON 01/27/12 BY GOLLD   Report Status PENDING   Incomplete   MRSA PCR SCREENING     Status: Normal   Collection Time   01/26/12 10:48 PM      Component Value Range Status Comment   MRSA by PCR NEGATIVE  NEGATIVE Final      A:   1) UTI. No clear evidence of other sites of infection. Normal WBC at admission. No fever documented. 2) History of MRSA but MRSA Pcr negative on admit 01/26/2012 3) History of Hep C 4) On 6/25: urine and blood cultures positive for Group B Strep Agalactiae  P:  1) Continue Zosyn  2) Will follow lactate, pro-calcitonin.  ENDOCRINE  Lab 01/28/12 0356 01/28/12 0150 01/27/12 1933 01/27/12 1744 01/27/12 1606  GLUCAP 100* 97 101* 97 107*   A:   1) Hypoglycemia  P:   1) Monitor BS q 1 hr 2) Dextrose 50% as needed per protocol 3) Dextrose 10% at 75 cc/hr and will adjust as necessary 4) ICU hyperglycemia protocol ordered   NEUROLOGIC  A:  1) Unresponsive after PEA arrest P:   1) On hypothermia protocol. 2) Analgesia and sedation with propofol and fentanyl 3) Paralysis per hypothermia protocol.  BEST PRACTICE / DISPOSITION - Level of Care:  ICU - Primary Service:  Critical care - Consultants:  Nephrology - Code Status:  Full code - Diet:  NPO - DVT Px:  Heparin - GI Px:  Protonix - Skin Integrity:  No decubitus ulcers at admission - Social / Family:  No family available at the time of my exam on 01/28/2012. Social work gave numbe for sister [name] who is main health  care provider. Patient has siblings   The patient is critically ill with multiple organ systems failure and requires high complexity decision making for assessment and support, frequent evaluation and titration of therapies, application of advanced monitoring technologies and extensive interpretation of multiple databases.   Critical Care Time devoted to patient care services described in this note is: 45 minutes   Dr. Kalman Shan, M.D., [facility].C.P Pulmonary and Critical Care Medicine Staff Physician [facility] [place] [facility] Pager: [phone], If no answer or between  15:00h - 7:00h: call [phone]  01/28/2012 10:02 AM

## 2012-01-28 NOTE — Progress Notes (Signed)
Pt CBG at 1600 was 55. Pt given 25 mL of D50. Rechecked CBG 15 minutes later and it was 101. Pt is currently resting and will continue to monitor.

## 2012-01-28 NOTE — Progress Notes (Signed)
01/28/2012 3:25 AM Abrasion to Lt Forearm has a 4x5 cm hematoma surrounding it. Ahmiyah Coil, Linnell Fulling

## 2012-01-28 NOTE — Progress Notes (Signed)
Clinical Social Worker received consult for contacting patient's next of kin. CSW reviewed chart and found patient's sister, Legrande Hao [phone]) phone number. Ms. Jowers spoke with CSW and stated that she is next of kin and patient has brothers as well. Ms. Standage discussed with CSW that she "takes care of everything" for the patient, from taking him to medical appointments, when she can get off work and help with bills. Ms. Schwandt described the concern for patient's substance abuse and that there have been previous attempts made to get patient into an inpatient rehab facility, but was unsuccessful due to his need for HD. Ms. Oien stated that she would call and visit patient about 2-3 times a day, but when he "fuss and get irate" she leaves. Ms. Bazen stated that patient currently lives with one of her sons, but that son is not at home all the time, so he is at the house by himself. Ms. Bynum reports that patient's drinking has been a concern for a while. Ms. Klasen stated that she will be happy to talk with the MD regarding patient's prognosis. CSW will sign off as social work intervention is no longer needed.   Rozetta Nunnery MSW, [employer] [phone]

## 2012-01-28 NOTE — Progress Notes (Signed)
eLink Physician-Brief Progress Note Patient Name: [name] DOB: 20-Feb-1956 MRN: [id number]  Date of Service  01/28/2012   HPI/Events of Note   Heparin subcut d/c'd when hypothermia ended  eICU Interventions  Add back heparin q8h   Intervention Category Intermediate Interventions: Best-practice therapies (e.g. DVT, beta blocker, etc.)  Izabel Chim S. 01/28/2012, 3:28 PM

## 2012-01-28 NOTE — Progress Notes (Addendum)
Continuous sedation turned off. WIS 80 mL of Fentanyl and 40 mL of Versed.  [name]

## 2012-01-28 NOTE — Progress Notes (Signed)
01/28/2012  1:00 AM Re-warming started. Verified by Fletcher Anon RN and [facility]. [name], [name]

## 2012-01-28 NOTE — Progress Notes (Signed)
Patient ID: [name], male   DOB: [birth year], 56 y.o.   MRN: [id number]  [facility] Progress Note    Subjective:   Intubated/sedated   Objective:   BP 129/63  Pulse 112  Temp 96.4 F (35.8 C) (Core (Comment))  Resp 24  Ht 6' (1.829 m)  Wt 75.8 kg (167 lb 1.7 oz)  BMI 22.66 kg/m2  SpO2 100%  Physical Exam: Gen:WD WN WM intubated/sedated NWG:NFAOZ, no rub Resp:scattered rhonchi HYQ:MVHQIO NGE:XBMWU pretib edema on right, minimal on left, AVG LUE +T/B  Labs: BMET  Lab 01/28/12 0738 01/28/12 0400 01/27/12 2340 01/27/12 1940 01/27/12 1630 01/27/12 1200 01/27/12 0800 01/27/12 0445 01/26/12 2053  NA 128* 130* 129* 130* 130* 130* 131* -- --  K 3.8 3.7 3.5 3.5 3.5 2.9* 3.1* -- --  CL 91* 93* 92* 93* 93* 92* 93* -- --  CO2 18* 18* 18* 18* 19 19 19  -- --  GLUCOSE 79 103* 122* 120* 130* 154* 187* -- --  BUN 61* 60* 59* 57* 57* 54* 52* -- --  CREATININE 7.65* 7.50* 7.50* 7.53* 7.46* 7.30* 7.25* -- --  ALB -- -- -- -- -- -- -- -- --  CALCIUM 8.2* 7.9* 7.9* 7.9* 8.0* 7.8* 7.6* -- --  PHOS -- -- -- -- -- -- -- 5.2* 12.8*   CBC  Lab 01/27/12 0445 01/26/12 2053 01/26/12 1754 01/26/12 1734  WBC 3.6* 7.3 -- 8.9  NEUTROABS -- 6.6 -- 7.1  HGB 11.3* 10.3* 12.6* 11.6*  HCT 33.0* 30.3* 37.0* 34.2*  MCV 94.6 95.9 -- 96.6  PLT 103* 155 -- 210    @IMGRELPRIORS @ Medications:      . antiseptic oral rinse  15 mL Mouth Rinse QID  . artificial tears  1 application Both Eyes Q8H  . chlorhexidine  15 mL Mouth/Throat BID  . ciprofloxacin  400 mg Intravenous Q24H  . fentaNYL infusion INTRAVENOUS  10 mcg/hr Intravenous To ER  . heparin  5,000 Units Subcutaneous Q8H  . insulin aspart  0-3 Units Subcutaneous Q4H  . pantoprazole (PROTONIX) IV  40 mg Intravenous QHS  . piperacillin-tazobactam (ZOSYN)  IV  2.25 g Intravenous Q8H  . potassium chloride      . potassium chloride  40 mEq Per Tube Once  . potassium chloride      . DISCONTD: ciprofloxacin  400 mg Intravenous  Q12H     Assessment/ Plan:   1. PEA arrest/SIRS- appreciate cards and PCCM input/care.   2. VDRF- per PCCM 3. ID- blood cultures 2/2 + for GNR and GPC in cath.  On vanc/cipro/zosyn per PCCM 4. ESRDcont with HD qMWF 5. Anemia:stable 6. CKD-MBD:per cards 7. Nutrition:low albumin due to etoh abuse  8. Hypertension:stable 9. Vascular access- +T/B 10. Hyponatremia- should improve with HD   Jalin Alicea A 01/28/2012, 10:45 AM

## 2012-01-28 NOTE — Progress Notes (Signed)
SUBJECTIVE: The patient remains sedated on the vent.  Now rewarming.  + blood cultures noted.     Marland Kitchen antiseptic oral rinse  15 mL Mouth Rinse QID  . artificial tears  1 application Both Eyes Q8H  . chlorhexidine  15 mL Mouth/Throat BID  . ciprofloxacin  400 mg Intravenous Q12H  . fentaNYL infusion INTRAVENOUS  10 mcg/hr Intravenous To ER  . heparin  5,000 Units Subcutaneous Q8H  . insulin aspart  0-3 Units Subcutaneous Q4H  . pantoprazole (PROTONIX) IV  40 mg Intravenous QHS  . piperacillin-tazobactam (ZOSYN)  IV  2.25 g Intravenous Q8H  . potassium chloride      . potassium chloride  40 mEq Per Tube Once  . potassium chloride          . sodium chloride 10 mL/hr at 01/26/12 2152  . sodium chloride 10 mL/hr at 01/26/12 2153  . cisatracurium (NIMBEX) infusion 3 mcg/kg/min (01/28/12 0700)  . dextrose    . fentaNYL infusion INTRAVENOUS 300 mcg/hr (01/28/12 0753)  . midazolam (VERSED) infusion 4 mg/hr (01/28/12 0700)  . norepinephrine (LEVOPHED) Adult infusion 15 mcg/min (01/28/12 0700)  . DISCONTD: dextrose 75 mL/hr at 01/27/12 0529  . DISCONTD: norepinephrine (LEVOPHED) Adult infusion 6 mcg/min (01/27/12 1500)    OBJECTIVE: Physical Exam: Filed Vitals:   01/28/12 0630 01/28/12 0642 01/28/12 0700 01/28/12 0733  BP:  123/63 126/61 154/64  Pulse: 97 98 98 103  Temp: 94.5 F (34.7 C) 94.6 F (34.8 C) 94.8 F (34.9 C)   TempSrc:      Resp: 24 24 20 24   Height:      Weight:      SpO2: 100% 100% 100% 100%    Intake/Output Summary (Last 24 hours) at 01/28/12 0757 Last data filed at 01/28/12 0730  Gross per 24 hour  Intake 2215.97 ml  Output      0 ml  Net 2215.97 ml    Telemetry reveals sinus rhythm  GEN- The patient is ill appearing,   Head- normocephalic, atraumatic Eyes-  Sclera clear, conjunctiva pink Oropharynx- ETT Neck- supple Lungs- decreased BS throughout Heart- Regular rate and rhythm,   GI- soft, NT, ND, + BS Extremities- no clubbing, cyanosis, or  edema Skin- no rash or lesion Psych- sedated Neuro- sedated  LABS: Basic Metabolic Panel:  Basename 01/28/12 0400 01/27/12 2340 01/27/12 0445 01/26/12 2053  NA 130* 129* -- --  K 3.7 3.5 -- --  CL 93* 92* -- --  CO2 18* 18* -- --  GLUCOSE 103* 122* -- --  BUN 60* 59* -- --  CREATININE 7.50* 7.50* -- --  CALCIUM 7.9* 7.9* -- --  MG -- -- 1.6 1.9  PHOS -- -- 5.2* 12.8*   Liver Function Tests:  [facility] 01/26/12 2053  AST 42*  ALT 27  ALKPHOS 102  BILITOT 0.2*  PROT 5.8*  ALBUMIN 2.2*    Basename 01/26/12 2053  LIPASE 99*  AMYLASE 140*   CBC:  Basename 01/27/12 0445 01/26/12 2053 01/26/12 1734  WBC 3.6* 7.3 --  NEUTROABS -- 6.6 7.1  HGB 11.3* 10.3* --  HCT 33.0* 30.3* --  MCV 94.6 95.9 --  PLT 103* 155 --   Cardiac Enzymes:  Basename 01/27/12 1200 01/27/12 0445 01/26/12 2045  CKTOTAL 1093* 1165* 678*  CKMB 33.2* 34.8* 28.3*  CKMBINDEX -- -- --  TROPONINI 0.48* 0.58* 0.65*   ASSESSMENT AND PLAN:  Principal Problem:  *PEA (Pulseless electrical activity) Active Problems:  End stage renal disease on  dialysis  Abdominal pain, generalized  Alcohol abuse  Syncope  Acute respiratory failure with hypoxia  Hyperkalemia   1. PEA arrest  2. Chest pain  3. Ventilator-dependent respiratory failure  4. Flash pulmonary edema  5. Hyperkalemia  6. Hypertension  7. ESRD on HD  8. Alcohol/polysubstance abuse   DISCUSSION/PLAN:   Presently rewarming.  Remains sedated.  CMs reviewed and are consistent with CPR.  Echo reveals EF 50-55%. Clinically without much change. Continue strict I/Os, daily weights. Would favor waiting for improvement from a respiratory, renal and neurological standpoint prior to ischemic evaluation. PEA Arrest was in the setting of noncompliance with dialysis and resultant metabolic derangement.     We will follow with you.    Hillis Range, MD 01/28/2012 7:57 AM

## 2012-01-28 NOTE — Progress Notes (Signed)
eLink Physician-Brief Progress Note Patient Name: [name] DOB: May 09, 1956 MRN: [id number]  Date of Service  01/28/2012   HPI/Events of Note   Hypotension progress through the pm, norepi currently at 35  eICU Interventions  NS bolus and follow response   Intervention Category Major Interventions: Shock - evaluation and management  Amiah Frohlich S. 01/28/2012, 9:29 PM

## 2012-01-28 NOTE — Progress Notes (Signed)
Pt CBG was 66 at 1200. Pt was placed on hypoglycemic protocol and was given 25 mL of D50. Rechecked CBG 15 minutes later and it was up to 124. Pt is resting comfortably and will continue to monitor.

## 2012-01-28 NOTE — Progress Notes (Signed)
01/28/2012 1950 CBG 39, 1 Amp D50 given IV. Repeat CBG 105. Per hypogycemia protocol. Gurney Balthazor, Linnell Fulling

## 2012-01-28 NOTE — Progress Notes (Signed)
01/28/2012 2150  IV NS bolus 1000 ml started for decreased BP

## 2012-01-29 ENCOUNTER — Inpatient Hospital Stay (HOSPITAL_COMMUNITY): Payer: Medicare Other

## 2012-01-29 DIAGNOSIS — J449 Chronic obstructive pulmonary disease, unspecified: Secondary | ICD-10-CM

## 2012-01-29 DIAGNOSIS — I4589 Other specified conduction disorders: Secondary | ICD-10-CM

## 2012-01-29 LAB — RENAL FUNCTION PANEL
CO2: 18 mEq/L — ABNORMAL LOW (ref 19–32)
Calcium: 7.5 mg/dL — ABNORMAL LOW (ref 8.4–10.5)
Creatinine, Ser: 7.84 mg/dL — ABNORMAL HIGH (ref 0.50–1.35)
GFR calc Af Amer: 8 mL/min — ABNORMAL LOW (ref >90)
GFR calc non Af Amer: 7 mL/min — ABNORMAL LOW (ref >90)
Glucose, Bld: 324 mg/dL — ABNORMAL HIGH (ref 70–99)
Phosphorus: 7.8 mg/dL — ABNORMAL HIGH (ref 2.3–4.6)
Sodium: 125 mEq/L — ABNORMAL LOW (ref 135–145)

## 2012-01-29 LAB — CULTURE, BLOOD (ROUTINE X 2)

## 2012-01-29 LAB — GLUCOSE, CAPILLARY
Glucose-Capillary: 110 mg/dL — ABNORMAL HIGH (ref 70–99)
Glucose-Capillary: 119 mg/dL — ABNORMAL HIGH (ref 70–99)
Glucose-Capillary: 139 mg/dL — ABNORMAL HIGH (ref 70–99)
Glucose-Capillary: 74 mg/dL (ref 70–99)
Glucose-Capillary: 74 mg/dL (ref 70–99)
Glucose-Capillary: 99 mg/dL (ref 70–99)

## 2012-01-29 MED ORDER — PRO-STAT SUGAR FREE PO LIQD
30.0000 mL | Freq: Three times a day (TID) | ORAL | Status: DC
  Administered 2012-01-29 – 2012-02-03 (×16): 30 mL
  Filled 2012-01-29 (×21): qty 30

## 2012-01-29 MED ORDER — DEXTROSE 10 % IV SOLN
INTRAVENOUS | Status: DC
  Administered 2012-01-29: via INTRAVENOUS
  Administered 2012-01-29: 1000 mL via INTRAVENOUS
  Administered 2012-01-29 – 2012-02-02 (×6): via INTRAVENOUS
  Administered 2012-02-03: 500 mL via INTRAVENOUS

## 2012-01-29 MED ORDER — VANCOMYCIN HCL 1000 MG IV SOLR
750.0000 mg | INTRAVENOUS | Status: DC
  Filled 2012-01-29: qty 750

## 2012-01-29 MED ORDER — FENTANYL BOLUS VIA INFUSION
50.0000 ug | Freq: Four times a day (QID) | INTRAVENOUS | Status: DC | PRN
  Administered 2012-01-31 – 2012-02-02 (×3): 100 ug via INTRAVENOUS
  Filled 2012-01-29: qty 100

## 2012-01-29 MED ORDER — DEXTROSE 50 % IV SOLN
INTRAVENOUS | Status: AC
  Administered 2012-01-29: 50 mL
  Filled 2012-01-29: qty 50

## 2012-01-29 MED ORDER — SODIUM CHLORIDE 0.9 % IV SOLN
50.0000 ug/h | INTRAVENOUS | Status: DC
  Administered 2012-01-29: 50 ug/h via INTRAVENOUS
  Administered 2012-01-30: 200 ug/h via INTRAVENOUS
  Administered 2012-01-30: 150 ug/h via INTRAVENOUS
  Administered 2012-01-31: 350 ug/h via INTRAVENOUS
  Administered 2012-01-31 (×2): 300 ug/h via INTRAVENOUS
  Administered 2012-02-01: 200 ug/h via INTRAVENOUS
  Administered 2012-02-01: 350 ug/h via INTRAVENOUS
  Administered 2012-02-02 – 2012-02-03 (×2): 200 ug/h via INTRAVENOUS
  Filled 2012-01-29 (×11): qty 50

## 2012-01-29 MED ORDER — NEPRO/CARBSTEADY PO LIQD
1000.0000 mL | ORAL | Status: DC
  Filled 2012-01-29: qty 1000

## 2012-01-29 MED ORDER — VASOPRESSIN 20 UNIT/ML IJ SOLN
0.0300 [IU]/min | INTRAVENOUS | Status: DC
  Filled 2012-01-29: qty 2.5

## 2012-01-29 MED ORDER — NEPRO/CARBSTEADY PO LIQD
1000.0000 mL | ORAL | Status: DC
  Administered 2012-01-29 – 2012-02-02 (×4): 1000 mL
  Filled 2012-01-29 (×8): qty 1000

## 2012-01-29 NOTE — Progress Notes (Signed)
Patient: [name] Date of Encounter: 01/29/2012, 8:50 AM Admit date: 01/26/2012     Subjective  Briefly, [name] is a 56 year old male with PMH significant for HTN, COPD, Hep C, polysubstance abuse, chronic recurrent pancreatitis, PUD and ESRD on hemodialysis. He initially called EMS from home complaining of chest pain. At arrival EMS found him down in PEA arrest. Advanced CPR was started with return to spontaneous circulation after 5 to 6 minutes. At arrival intubated and unresponsive. EKG showed sinus rhythm with peak T waves. Initial K was 6.4. Chest X ray showed cardiomegaly and pulmonary edema but no focal infiltrates.   This AM he remains intubated. Overnight he required up-titration of IV pressors.   Objective  Physical Exam: Vitals: BP 135/67  Pulse 112  Temp 99.8 F (37.7 C) (Oral)  Resp 24  Ht 6' (1.829 m)  Wt 173 lb 4.5 oz (78.6 kg)  BMI 23.50 kg/m2  SpO2 97% General: Ill appearing 56 year old male who is intubated. Neck: Supple. JVD not elevated. Lungs: Coarse breath sounds throughout. Heart: RRR S1 S2 without murmurs, rubs, or gallops.  Abdomen: Soft, non-distended. Extremities: No clubbing or cyanosis. No edema.  Distal pedal pulses are 2+ and equal bilaterally.  Intake/Output: Intake/Output Summary (Last 24 hours) at 01/29/12 0850 Last data filed at 01/29/12 0700  Gross per 24 hour  Intake 2707.38 ml  Output      0 ml  Net 2707.38 ml   Inpatient Medications:  . antiseptic oral rinse  15 mL Mouth Rinse QID  . chlorhexidine  15 mL Mouth/Throat BID  . heparin subcutaneous  5,000 Units Subcutaneous Q8H  . insulin aspart  0-3 Units Subcutaneous Q4H  . pantoprazole (PROTONIX) IV  40 mg Intravenous QHS  . piperacillin-tazobactam (ZOSYN)  IV  2.25 g Intravenous Q8H  . sodium chloride  1,000 mL Intravenous Once   . sodium chloride 10 mL/hr at 01/26/12 2152  . sodium chloride 10 mL/hr at 01/26/12 2153  . dextrose 50 mL/hr at 01/29/12 0023  .  norepinephrine (LEVOPHED) Adult infusion 25 mcg/min (01/29/12 0506)   Labs: [name] 01/29/12 0424 01/28/12 1200 01/27/12 0445 01/26/12 2053  NA 125* 129* -- --  K 5.4* 4.7 -- --  CL 90* 93* -- --  CO2 18* 18* -- --  GLUCOSE 324* 70 -- --  BUN 65* 61* -- --  CREATININE 7.84* 7.69* -- --  CALCIUM 7.5* 8.0* -- --  MG -- -- 1.6 1.9  PHOS 7.8* -- 5.2* --   Basename 01/29/12 0424 01/26/12 2053  AST -- 42*  ALT -- 27  ALKPHOS -- 102  BILITOT -- 0.2*  PROT -- 5.8*  ALBUMIN 1.6* 2.2*    Basename 01/26/12 2053  LIPASE 99*  AMYLASE 140*    Basename 01/27/12 0445 01/26/12 2053 01/26/12 1734  WBC 3.6* 7.3 --  NEUTROABS -- 6.6 7.1  HGB 11.3* 10.3* --  HCT 33.0* 30.3* --  MCV 94.6 95.9 --  PLT 103* 155 --    Basename 01/27/12 1200 01/27/12 0445 01/26/12 2045  CKTOTAL 1093* 1165* 678*  CKMB 33.2* 34.8* 28.3*  TROPONINI 0.48* 0.58* 0.65*    Blood and urine cultures +Group B Strep  Radiology/Studies: 01/29/2012  *RADIOLOGY REPORT*  Clinical Data: Follow-up for edema.  The patient is on hemodialysis.  PORTABLE CHEST - 1 VIEW  Comparison: Chest x-ray 01/26/2012.  Findings: An endotracheal tube is in place with tip 6.4 cm above the carina. There is a  left-sided internal jugular central venous catheter with tip terminating in the mid superior vena cava. A nasogastric tube is seen extending into the stomach, however, the tip of the nasogastric tube extends below the lower margin of the image.  Significantly worsening bibasilar opacities may reflect a combination of atelectasis and/or consolidation, likely with moderate left and small right-sided pleural effusions.  There appears to be only mild pulmonary venous congestion.  Cardiac silhouette is completely obscured.  Mediastinal contours are unremarkable allowing for the mild patient rotation to the right.  IMPRESSION: 1.  Significantly worsened bibasilar aeration which may represent areas of atelectasis and/or consolidation (from a large  volume aspiration or developing multilobar infection).  There are also moderate left and small right-sided pleural effusions which have significantly increased. 2.  Support apparatus, as above.  Original Report Authenticated By: Florencia Reasons, M.D.   Echocardiogram: - Left ventricle: The cavity size was normal. Wall thickness was normal. Systolic function was normal. The estimated ejection fraction was in the range of 50% to 55%. Wall motion was normal; there were no regional wall motion abnormalities. Doppler parameters are consistent with abnormal left ventricular relaxation (grade 1 diastolic dysfunction). - Mitral valve: Calcified annulus. Mildly thickened leaflets. Mild regurgitation. - Pulmonary arteries: Systolic pressure was mildly increased. PA peak pressure: 38mm Hg (S).  Telemetry: sinus tachycardia with frequent PVCs   Assessment and Plan  1. PEA arrest  2. Chest pain  3. Ventilator-dependent respiratory failure  4. Sepsis 5. Hyperkalemia  6. Hypertension  7. ESRD on HD  8. Alcohol/polysubstance abuse   Continue strict I/Os and daily weights. Would favor waiting for improvement from a respiratory, renal and neurological standpoint prior to ischemic evaluation. PEA arrest was in the setting of noncompliance with dialysis and resultant metabolic derangement. Will follow along with you.  Signed, Lumir Demetriou PA-C

## 2012-01-29 NOTE — Progress Notes (Signed)
Name: [name] MRN: [id number] DOB: Sep 13, 1955    LOS: 3 Date of admit 01/26/2012  5:09 PM  PULMONARY / CRITICAL CARE MEDICINE  BRIEF  56 year old male with PMH relevant for HTN, COPD, Hep C, polysubstance abuse, chronic recurrent pancreatitis, PUD and ESRD on hemodialysis. He initially called EMS from home complaining of chest pain. At arrival EMS found him down in PEA arrest. He was intubated in the field, advanced CPR was initiated and return to spontaneous circulation was obtained after 5 to 6 minutes. Initial EKG showed peaked T waves and at arrival had K of 6.4. He received bicarbonate, calcium and glucose/insuline. At arrival was intubated, unresponsive even to painful stimuli, monitor showed sinus tachycardia.  He has an IO access placed in the field. No family is available to take more history. As per the ER, the patient had missed dialysis for the past week.   KEY SOCIAL HX  - Patient's sister, [name] [phone]). Patient has brothers as well. [name]  Most involved and admitted to patient problems with non compliance and substance abuse   EVENTS 6/25 increasing pressors, fluid bolus for low bp, hypoglycemic requiring D10   SUBJECTIVE/OVERNIGHT/INTERVAL HX 6/25 increasing pressors, fluid bolus for low bp, hypoglycemic requiring D10  Rewarmed, febrile  Vital Signs: Temp:  [95.2 F (35.1 C)-101.1 F (38.4 C)] 99.8 F (37.7 C) (06/26 0400) Pulse Rate:  [92-119] 99  (06/26 0731) Resp:  [9-26] 24  (06/26 0731) BP: (69-226)/(38-94) 119/55 mmHg (06/26 0731) SpO2:  [89 %-100 %] 99 % (06/26 0731) FiO2 (%):  [40 %] 40 % (06/26 0731) Weight:  [78.6 kg (173 lb 4.5 oz)] 78.6 kg (173 lb 4.5 oz) (06/26 0433)  Physical Examination: General:  Intubated, mechanically ventilated, no acute distress. Neuro:  Arouses, follows commands, moves BUEs 3/5,less LE 2/5 HEENT:  PERRL, pink conjunctivae, moist membranes. Neck:  Supple, no JVD   Cardiovascular:  RRR, no  M/R/G Lungs:  Bilateral diminished air entry, coarse breath sounds bilaterally, no wheezing.  Abdomen:  soft, mildly distended, diminished bowel sounds but present. Musculoskeletal:  No pedal edema, cyanosis or clubbing. Skin:  No rash. Multiple skin superficial erosions, bruises and crusts.     ASSESSMENT AND PLAN: 56 year old male with PMH relevant for HTN, COPD, Hep C, polysubstance abuse, chronic recurrent pancreatitis, PUD and ESRD on hemodialysis. He initially called EMS from home complaining of chest pain. At arrival EMS found him down in PEA arrest. Advanced CPR was started with return to spontaneous circulation after 5 to 6 minutes. At arrival intubated and unresponsive. EKG showed sinus rhythm with peak T waves. Initial K was 6.4. Chest X ray showed cardiomegaly and pulmonary edema but no focal infiltrates.    Patient Active Problem List  Diagnosis  . Pancytopenia  . SUBSTANCE ABUSE, MULTIPLE  . HYPERTENSION, ESSENTIAL, UNCONTROLLED  . PEPTIC ULCER DISEASE  . Chronic recurrent pancreatitis  . End stage renal disease on dialysis  . Abdominal pain, generalized  . Alcohol abuse  . COPD (chronic obstructive pulmonary disease)  . Neuropathy  . Anxiety  . Syncope  . Protein malnutrition  . PEA (Pulseless electrical activity)  . Acute respiratory failure with hypoxia  . Hyperkalemia       PULMONARY  Lab 01/27/12 0520 01/26/12 2217 01/26/12 1723  PHART 7.354 6.988* 7.020*  PCO2ART 31.5* 58.6* 64.5*  PO2ART 88.8 119.0* 511.0*  HCO3 18.2* 14.3* 16.6*  O2SAT 97.1 96.0 100.0   Ventilator Settings: Vent Mode:  [-]  PRVC FiO2 (%):  [40 %] 40 % Set Rate:  [24 bmp] 24 bmp Vt Set:  [550 mL] 550 mL PEEP:  [5 cmH20] 5 cmH20 Plateau Pressure:  [19 cmH20-25 cmH20] 19 cmH20 CXR:  Devices in good posiiton ETT:  01/26/2012 >>    A:   1) Respiratory failure secondary to cardiac arrest 2) History of COPD 3) Respiratory and metabolic acidosis   P:   No weaning while on  pressors Albuterol/ipatropium  CXR today CARDIOVASCULAR  Lab 01/27/12 1200 01/27/12 0445 01/26/12 2045  TROPONINI 0.48* 0.58* 0.65*  LATICACIDVEN -- -- 0.7  PROBNP -- -- 22037.0*   ECG:  Peak T waves, Sinus rhythm at admit on 6/23 -> normalized 6/24 Lines: Left IJ placed. Adequate position verified on chest X ray.  A:  1) PEA arrest. Unclear etiology, likely metabolic derrangements secondary to non compliance with dialysis and sepsis 2) History of hypertension 3) Mild bump in tropnin since admit probably secondary to sepsis  P:  - rewarmed - Add vaso gtt , will Rx as septic/ cardiogenic shock - appreciate [place] cards; recommends conservative Rx  RENAL  Lab 01/29/12 0424 01/28/12 1200 01/28/12 0738 01/28/12 0400 01/27/12 2340 01/27/12 0445 01/26/12 2053  NA 125* 129* 128* 130* 129* -- --  K 5.4* 4.7 -- -- -- -- --  CL 90* 93* 91* 93* 92* -- --  CO2 18* 18* 18* 18* 18* -- --  BUN 65* 61* 61* 60* 59* -- --  CREATININE 7.84* 7.69* 7.65* 7.50* 7.50* -- --  CALCIUM 7.5* 8.0* 8.2* 7.9* 7.9* -- --  MG -- -- -- -- -- 1.6 1.9  PHOS 7.8* -- -- -- -- 5.2* 12.8*   Intake/Output      06/25 0701 - 06/26 0700 06/26 0701 - 06/27 0700   I.V. (mL/kg) 1389.7 (17.7)    NG/GT 110    IV Piggyback 1410    Total Intake(mL/kg) 2909.7 (37)    Total Output 0    Net +2909.7          Foley:  Placed at arrival  A:   1) ESRD on hemodialysis 2) Hyperkalemia, hyponatremia at admit on 01/26/2012. S/p emergent HD on 01/26/12    -  P:  Nephrology input  HD today for high K  - POS 4 L, but keep even  Today given low BP   GASTROINTESTINAL  Lab 01/29/12 0424 01/26/12 2053  AST -- 42*  ALT -- 27  ALKPHOS -- 102  BILITOT -- 0.2*  PROT -- 5.8*  ALBUMIN 1.6* 2.2*     A:  1) History of PUD but lactate normal at admit  P:   1) NGT in place 2)start nepro 3) Protonix IV  HEMATOLOGIC  Lab 01/27/12 0453 01/27/12 0445 01/26/12 2053 01/26/12 1754 01/26/12 1734  HGB -- 11.3* 10.3* 12.6*  11.6*  HCT -- 33.0* 30.3* 37.0* 34.2*  PLT -- 103* 155 -- 210  INR 1.12 -- 1.24 -- --  APTT 44* -- 42* -- --   A:  Anemic or critical illness P:   - goal Hb 7 & above ok  INFECTIOUS  Lab 01/27/12 0445 01/26/12 2053 01/26/12 2045 01/26/12 1734  WBC 3.6* 7.3 -- 8.9  PROCALCITON -- -- 3.80 --   Cultures:  6/23 urine >> strep agalactiae 6/23 resp >>strep agalactiae 6/23 bld >> GNR >>  A:   1) G Neg sepsis 2) History of MRSA but MRSA Pcr negative on admit 01/26/2012 3) History of Hep C  4) On 6/25: urine and blood cultures positive for Group B Strep Agalactiae  P:  Continue Zosyn , dc cipro  --> simplify in 24 h   ENDOCRINE  Lab 01/29/12 0426 01/29/12 0403 01/29/12 0002 01/28/12 2335 01/28/12 2009  GLUCAP 139* 63* 99 52* 105*   A:   1) Hypoglycemia  P:    Monitor BS q 1 hr  Dextrose 10% at 50 cc/hr and will adjust as necessary  ICU hyperglycemia protocol ordered   NEUROLOGIC  A:  Encephalopathy - improved P:   S/p  hypothermia protocol.  Analgesia and sedation with  Fentanyl prn Not moving BLEs as much - follow   BEST PRACTICE / DISPOSITION - Level of Care:  ICU - Primary Service:  Critical care - Consultants:  Nephrology - Code Status:  Full code - Diet:  NPO - DVT Px:  Heparin - GI Px:  Protonix - Skin Integrity:  No decubitus ulcers at admission - Social / Family:  No family available at the time of my exam on 01/29/2012. Social work gave numbe for sister [name] who is main health care provider. Patient has siblings   The patient is critically ill with multiple organ systems failure and requires high complexity decision making for assessment and support, frequent evaluation and titration of therapies, application of advanced monitoring technologies and extensive interpretation of multiple databases.   Critical Care Time devoted to patient care services described in this note is: 45 minutes   Cyril Mourning MD. FCCP. [facility] Pager [phone] If no response call [phone]    01/29/2012 7:44 AM

## 2012-01-29 NOTE — Progress Notes (Signed)
Nutrition Follow-up/consult for TF initiation and management   Pt remains intubated and sedated, s/p hypothermia protocol. Required increase in pressors and fluid bolus for low bp overnight. Has had several episodes of hypoglycemia.  Pt for HD today.  TF was initiated, Nepro @10  advance to goal of 30 ml/hr. Goal rate will provide 1296 kcal, 58 gm protein, and 523 ml free water.   Diet Order:  NPO  Meds: Scheduled Meds:   . antiseptic oral rinse  15 mL Mouth Rinse QID  . chlorhexidine  15 mL Mouth/Throat BID  . dextrose      . dextrose      . dextrose      . dextrose      . heparin subcutaneous  5,000 Units Subcutaneous Q8H  . insulin aspart  0-3 Units Subcutaneous Q4H  . pantoprazole (PROTONIX) IV  40 mg Intravenous QHS  . piperacillin-tazobactam (ZOSYN)  IV  2.25 g Intravenous Q8H  . sodium chloride  1,000 mL Intravenous Once  . DISCONTD: artificial tears  1 application Both Eyes Q8H  . DISCONTD: ciprofloxacin  400 mg Intravenous Q12H  . DISCONTD: ciprofloxacin  400 mg Intravenous Q24H  . DISCONTD: heparin  5,000 Units Subcutaneous Q8H  . DISCONTD: heparin  5,000 Units Intravenous Q8H  . DISCONTD: vancomycin  750 mg Intravenous Q M,W,F-HD   Continuous Infusions:   . sodium chloride 10 mL/hr at 01/26/12 2152  . sodium chloride 10 mL/hr at 01/26/12 2153  . dextrose    . dextrose 50 mL/hr at 01/29/12 0023  . feeding supplement (NEPRO CARB STEADY)    . norepinephrine (LEVOPHED) Adult infusion 25 mcg/min (01/29/12 0506)  . vasopressin (PITRESSIN) infusion - *FOR SHOCK*    . DISCONTD: cisatracurium (NIMBEX) infusion Stopped (01/28/12 1050)  . DISCONTD: fentaNYL infusion INTRAVENOUS Stopped (01/28/12 1300)  . DISCONTD: midazolam (VERSED) infusion Stopped (01/28/12 1300)   PRN Meds:.sodium chloride, sodium chloride, sodium chloride, acetaminophen, albuterol, dextrose, dextrose, dextrose, fentaNYL, heparin, heparin, lidocaine, lidocaine-prilocaine, midazolam,  pentafluoroprop-tetrafluoroeth, DISCONTD: dextrose, DISCONTD: feeding supplement (NEPRO CARB STEADY), DISCONTD: fentaNYL, DISCONTD: fentaNYL, DISCONTD: midazolam  Labs:  CMP     Component Value Date/Time   NA 125* 01/29/2012 0424   K 5.4* 01/29/2012 0424   CL 90* 01/29/2012 0424   CO2 18* 01/29/2012 0424   GLUCOSE 324* 01/29/2012 0424   BUN 65* 01/29/2012 0424   CREATININE 7.84* 01/29/2012 0424   CREATININE 8.17* 10/15/2011 1709   CALCIUM 7.5* 01/29/2012 0424   CALCIUM 7.6* 04/04/2011 0315   PROT 5.8* 01/26/2012 2053   ALBUMIN 1.6* 01/29/2012 0424   AST 42* 01/26/2012 2053   ALT 27 01/26/2012 2053   ALKPHOS 102 01/26/2012 2053   BILITOT 0.2* 01/26/2012 2053   GFRNONAA 7* 01/29/2012 0424   GFRAA 8* 01/29/2012 0424   Magnesium  Date/Time Value Range Status  01/27/2012  4:45 AM 1.6  1.5 - 2.5 mg/dL Final   Phosphorus  Date/Time Value Range Status  01/29/2012  4:24 AM 7.8* 2.3 - 4.6 mg/dL Final      Intake/Output Summary (Last 24 hours) at 01/29/12 0845 Last data filed at 01/29/12 0700  Gross per 24 hour  Intake 2707.38 ml  Output      0 ml  Net 2707.38 ml    Weight Status:  173 lbs, up 11 lbs from admission weight, 6 lb increase overnight. Weight is likely increased r/t positive fluid balance and missing HD for 1 week prior to admission.   Re-estimated needs:  2027 kcal, 115-130 gm protein  Using admission weight, 73.6 kg Tmax- 37.7 C VE- 13.1  Nutrition Dx:  Inadequate oral intake r/t inability to eat AEB NPO, vent dependant  Goal:  If pt to remain intubated, initiate EN- met New Goal: EN to provide >/=90% estimated nutrition needs  Intervention:   1. RD will increase Nepro to a goal rate of 40 ml/hr 2. Add 30 ml Pro-stat TID via OG tube  This will provide 2028 kcal, 123 gm protein, and 698 ml free water.  3. If IV fluids are d/c'd pt will require additional free water flushes to meet hydration needs.  4. RD will continue to follow  Monitor:  TF, vent status, weight,  HD   Rudean Haskell Pager #:  [phone]

## 2012-01-29 NOTE — Progress Notes (Signed)
Patient ID: [name], male   DOB: Jun 21, 1956, 56 y.o.   MRN: [id number]  [facility] Progress Note    Subjective:   Patient was seen on dialysis and the procedure was supervised. BFR 400 Via LUE AVG BP is 137/63.  Patient had an initial drop in BP so is out of UF, intubated and sedated.    Objective:   BP 127/73  Pulse 118  Temp 99.8 F (37.7 C) (Oral)  Resp 24  Ht 6' (1.829 m)  Wt 78.6 kg (173 lb 4.5 oz)  BMI 23.50 kg/m2  SpO2 98%  Physical Exam: Gen:WD WN WM intubated and sedated JYN:WGNFA Resp:scattered rhonchi OZH:YQMVHQ Ext:1+ pitting edema RLE, trace on left  Labs: BMET  Lab 01/29/12 0424 01/28/12 1200 01/28/12 0738 01/28/12 0400 01/27/12 2340 01/27/12 1940 01/27/12 1630 01/27/12 0445 01/26/12 2053  NA 125* 129* 128* 130* 129* 130* 130* -- --  K 5.4* 4.7 3.8 3.7 3.5 3.5 3.5 -- --  CL 90* 93* 91* 93* 92* 93* 93* -- --  CO2 18* 18* 18* 18* 18* 18* 19 -- --  GLUCOSE 324* 70 79 103* 122* 120* 130* -- --  BUN 65* 61* 61* 60* 59* 57* 57* -- --  CREATININE 7.84* 7.69* 7.65* 7.50* 7.50* 7.53* 7.46* -- --  ALBUMIN 1.6* -- -- -- -- -- -- -- 2.2*  CALCIUM 7.5* 8.0* 8.2* 7.9* 7.9* 7.9* 8.0* -- --  PHOS 7.8* -- -- -- -- -- -- 5.2* 12.8*   CBC  Lab 01/27/12 0445 01/26/12 2053 01/26/12 1754 01/26/12 1734  WBC 3.6* 7.3 -- 8.9  NEUTROABS -- 6.6 -- 7.1  HGB 11.3* 10.3* 12.6* 11.6*  HCT 33.0* 30.3* 37.0* 34.2*  MCV 94.6 95.9 -- 96.6  PLT 103* 155 -- 210    @IMGRELPRIORS @ Medications:      . antiseptic oral rinse  15 mL Mouth Rinse QID  . chlorhexidine  15 mL Mouth/Throat BID  . dextrose      . dextrose      . dextrose      . dextrose      . feeding supplement  30 mL Per Tube TID  . heparin subcutaneous  5,000 Units Subcutaneous Q8H  . insulin aspart  0-3 Units Subcutaneous Q4H  . pantoprazole (PROTONIX) IV  40 mg Intravenous QHS  . piperacillin-tazobactam (ZOSYN)  IV  2.25 g Intravenous Q8H  . sodium chloride  1,000 mL Intravenous Once  .  DISCONTD: artificial tears  1 application Both Eyes Q8H  . DISCONTD: ciprofloxacin  400 mg Intravenous Q24H  . DISCONTD: heparin  5,000 Units Subcutaneous Q8H  . DISCONTD: heparin  5,000 Units Intravenous Q8H  . DISCONTD: vancomycin  750 mg Intravenous Q M,W,F-HD     Assessment/ Plan:   1. PEA arrest- appreciate cards and PCCM input/care. Still requiring pressors 2. VDRF- per PCCM 3. SIRS/ID- blood cultures 2/2 + for GNR and GPC in cath. On vanc/cipro/zosyn per PCCM 4. ESRDcont with HD qMWF 5. Anemia:stable 6. CKD-MBD:per cards 7. Nutrition:low albumin due to etoh abuse  8. Hypertension:stable 9. Vascular access- +T/B 10. Hyponatremia- should improve with HD.  Check cortisol to r/o AI    [name] 01/29/2012, 10:58 AM

## 2012-01-29 NOTE — Progress Notes (Signed)
ANTIBIOTIC CONSULT NOTE - FOLLOW UP  Pharmacy Consult for vancomycin Indication: +Cx  Labs:  Basename 01/29/12 0424 01/28/12 1200 01/28/12 0738 01/27/12 0445 01/26/12 2053 01/26/12 1754 01/26/12 1734  WBC -- -- -- 3.6* 7.3 -- 8.9  HGB -- -- -- 11.3* 10.3* 12.6* --  PLT -- -- -- 103* 155 -- 210  LABCREA -- -- -- -- -- -- --  CREATININE 7.84* 7.69* 7.65* -- -- -- --   Estimated Creatinine Clearance: 11.4 ml/min (by C-G formula based on Cr of 7.84).  Microbiology: Recent Results (from the past 720 hour(s))  MRSA PCR SCREENING     Status: Normal   Collection Time   01/11/12  7:46 AM      Component Value Range Status Comment   MRSA by PCR NEGATIVE  NEGATIVE Final   CULTURE, BLOOD (ROUTINE X 2)     Status: Normal (Preliminary result)   Collection Time   01/26/12  8:50 PM      Component Value Range Status Comment   Specimen Description BLOOD   Final    Special Requests     Final    Value: LEFT IJ BOTTLES DRAWN AEROBIC AND ANAEROBIC 10CC EACH   Culture  Setup Time [id number]   Final    Culture     Final    Value: GROUP B STREP(S.AGALACTIAE)ISOLATED     Note: Gram Stain Report Called to,Read Back By and Verified With: Mariana Kaufman @ 1444 01/27/12 BY KRAWS   Report Status PENDING   Incomplete   URINE CULTURE     Status: Normal   Collection Time   01/26/12  9:39 PM      Component Value Range Status Comment   Specimen Description URINE, CATHETERIZED   Final    Special Requests Normal   Final    Culture  Setup Time [id number]   Final    Colony Count >=100,000 COLONIES/ML   Final    Culture     Final    Value: GROUP B STREP(S.AGALACTIAE)ISOLATED     Note: TESTING AGAINST S. AGALACTIAE NOT ROUTINELY PERFORMED DUE TO PREDICTABILITY OF AMP/PEN/VAN SUSCEPTIBILITY.   Report Status 01/28/2012 FINAL   Final   CULTURE, BLOOD (ROUTINE X 2)     Status: Normal (Preliminary result)   Collection Time   01/26/12 10:06 PM      Component Value Range Status Comment   Specimen Description BLOOD  RIGHT RADIAL A-LINE DRAW   Final    Special Requests BOTTLES DRAWN AEROBIC AND ANAEROBIC [facility]   Final    Culture  Setup Time [id number]   Final    Culture     Final    Value: GRAM NEGATIVE RODS     Note: Gram Stain Report Called to,Read Back By and Verified With: ROSE COLLOM @ 2150 ON 01/27/12 BY GOLLD   Report Status PENDING   Incomplete   MRSA PCR SCREENING     Status: Normal   Collection Time   01/26/12 10:48 PM      Component Value Range Status Comment   MRSA by PCR NEGATIVE  NEGATIVE Final     Anti-infectives     Start     Dose/Rate Route Frequency Ordered Stop   01/28/12 2308   ciprofloxacin (CIPRO) IVPB 400 mg        400 mg 200 mL/hr over 60 Minutes Intravenous Every 24 hours 01/28/12 0941     01/27/12 2230   ciprofloxacin (CIPRO) IVPB 400 mg  Status:  Discontinued  400 mg 200 mL/hr over 60 Minutes Intravenous Every 12 hours 01/27/12 2209 01/28/12 0941   01/26/12 2300   vancomycin (VANCOCIN) 1,500 mg in sodium chloride 0.9 % 500 mL IVPB        1,500 mg 250 mL/hr over 120 Minutes Intravenous  Once 01/26/12 2120 01/27/12 0226   01/26/12 2200  piperacillin-tazobactam (ZOSYN) IVPB 2.25 g       2.25 g 100 mL/hr over 30 Minutes Intravenous 3 times per day 01/26/12 2118            Assessment: 56yo male with + blood Cx showing GPC now resuming HD; rec'd load earlier in admission.  Goal of Therapy:  Pre-HD vanc level 15-25  Plan:  Will start vancomycin 750mg  IV after each HD and continue to follow.  Colleen Can PharmD BCPS 01/29/2012,6:10 AM

## 2012-01-29 NOTE — Progress Notes (Signed)
01/29/2012 0400 CBG 63 1 amp D50 IV given. Repeat CBG @0415  139 Mittens placed on hands bilat. For safety.  Tiani Stanbery, Linnell Fulling

## 2012-01-29 NOTE — Progress Notes (Signed)
Name: [name] MRN: [id number] DOB: 1956-06-13  ELECTRONIC ICU PHYSICIAN NOTE  Problem:  Hypoglycemia in esrf pt  Intervention:  Add d10w at 50 cc / h  Sandrea Hughs 01/29/2012, 12:00 AM

## 2012-01-30 ENCOUNTER — Inpatient Hospital Stay (HOSPITAL_COMMUNITY): Payer: Medicare Other

## 2012-01-30 DIAGNOSIS — R7881 Bacteremia: Secondary | ICD-10-CM

## 2012-01-30 DIAGNOSIS — B965 Pseudomonas (aeruginosa) (mallei) (pseudomallei) as the cause of diseases classified elsewhere: Secondary | ICD-10-CM | POA: Diagnosis present

## 2012-01-30 LAB — BASIC METABOLIC PANEL
BUN: 26 mg/dL — ABNORMAL HIGH (ref 6–23)
CO2: 24 mEq/L (ref 19–32)
Calcium: 8.1 mg/dL — ABNORMAL LOW (ref 8.4–10.5)
Creatinine, Ser: 4.16 mg/dL — ABNORMAL HIGH (ref 0.50–1.35)
GFR calc non Af Amer: 15 mL/min — ABNORMAL LOW (ref >90)
Glucose, Bld: 113 mg/dL — ABNORMAL HIGH (ref 70–99)

## 2012-01-30 LAB — POCT I-STAT 3, ART BLOOD GAS (G3+)
O2 Saturation: 89 %
pCO2 arterial: 66.3 mmHg (ref 35.0–45.0)

## 2012-01-30 LAB — BLOOD GAS, ARTERIAL
Bicarbonate: 26.3 mEq/L — ABNORMAL HIGH (ref 20.0–24.0)
FIO2: 0.4 %
MECHVT: 0.55 mL
TCO2: 27.8 mmol/L (ref 0–100)
pCO2 arterial: 48.1 mmHg — ABNORMAL HIGH (ref 35.0–45.0)
pH, Arterial: 7.356 (ref 7.350–7.450)
pO2, Arterial: 68.4 mmHg — ABNORMAL LOW (ref 80.0–100.0)

## 2012-01-30 LAB — PROTIME-INR
INR: 1.18 (ref 0.00–1.49)
Prothrombin Time: 15.3 seconds — ABNORMAL HIGH (ref 11.6–15.2)

## 2012-01-30 LAB — CBC
MCH: 32.8 pg (ref 26.0–34.0)
MCHC: 34.3 g/dL (ref 30.0–36.0)
MCV: 95.4 fL (ref 78.0–100.0)
Platelets: 46 10*3/uL — ABNORMAL LOW (ref 150–400)
RDW: 16.4 % — ABNORMAL HIGH (ref 11.5–15.5)

## 2012-01-30 LAB — GLUCOSE, CAPILLARY
Glucose-Capillary: 100 mg/dL — ABNORMAL HIGH (ref 70–99)
Glucose-Capillary: 74 mg/dL (ref 70–99)
Glucose-Capillary: 75 mg/dL (ref 70–99)

## 2012-01-30 LAB — CLOSTRIDIUM DIFFICILE BY PCR: Toxigenic C. Difficile by PCR: POSITIVE — AB

## 2012-01-30 LAB — CULTURE, BLOOD (ROUTINE X 2): Culture  Setup Time: 201306240241

## 2012-01-30 MED ORDER — METRONIDAZOLE IN NACL 5-0.79 MG/ML-% IV SOLN
500.0000 mg | Freq: Three times a day (TID) | INTRAVENOUS | Status: DC
  Administered 2012-01-30 – 2012-02-05 (×17): 500 mg via INTRAVENOUS
  Filled 2012-01-30 (×22): qty 100

## 2012-01-30 MED ORDER — DEXTROSE 5 % IV SOLN
1.0000 g | INTRAVENOUS | Status: AC
  Administered 2012-01-30: 1 g via INTRAVENOUS
  Filled 2012-01-30: qty 1

## 2012-01-30 MED ORDER — DEXTROSE 5 % IV SOLN
2.0000 g | INTRAVENOUS | Status: DC
  Administered 2012-01-31 – 2012-02-03 (×2): 2 g via INTRAVENOUS
  Filled 2012-01-30 (×4): qty 2

## 2012-01-30 MED ORDER — CIPROFLOXACIN IN D5W 400 MG/200ML IV SOLN
400.0000 mg | INTRAVENOUS | Status: AC
  Administered 2012-01-30: 400 mg via INTRAVENOUS
  Filled 2012-01-30: qty 200

## 2012-01-30 MED ORDER — MUPIROCIN CALCIUM 2 % EX CREA
1.0000 "application " | TOPICAL_CREAM | Freq: Two times a day (BID) | CUTANEOUS | Status: DC
  Administered 2012-01-30 – 2012-02-21 (×44): 1 via TOPICAL
  Filled 2012-01-30 (×5): qty 15

## 2012-01-30 MED ORDER — VANCOMYCIN 50 MG/ML ORAL SOLUTION
500.0000 mg | Freq: Four times a day (QID) | ORAL | Status: AC
  Administered 2012-01-30 – 2012-02-13 (×50): 500 mg via ORAL
  Filled 2012-01-30 (×58): qty 10

## 2012-01-30 MED ORDER — CIPROFLOXACIN IN D5W 400 MG/200ML IV SOLN: 400.0000 mg | INTRAVENOUS | Status: DC

## 2012-01-30 MED ORDER — DEXTROSE 5 % IV SOLN: 2.0000 g | Freq: Three times a day (TID) | INTRAVENOUS | Status: DC

## 2012-01-30 NOTE — Consult Note (Signed)
WOC consult Note Reason for Consult: Wound consult requested for right thigh wound and left arm skin tear. Wound type:  Right thigh full thickness wound.  1X1X.3cm.  Small amt yellow drainage, no odor, 80% red, 20% yellow. Smaller deep area in center of wound is .1X.1cm Left arm with partial thickness skin tear 1X3X.1cm, red wound bed, skin approximated over 50% of wound, minimal pink drainage, no odor. Dressing procedure/placement/frequency: Bactroban to provide antimicrobial benefits. Foam dressing to protect and promote healing to leg wound and arm skin tear. Will not plan to follow further unless re-consulted.  [address] RN, MSN, [employer]  [phone]

## 2012-01-30 NOTE — Progress Notes (Signed)
ANTIBIOTIC CONSULT NOTE - INITIAL  Pharmacy Consult for Cipro Indication: pneumonia; sepsis  Allergies  Allergen Reactions  . Penicillins     "childhood reaction"    Patient Measurements: Height: 6' (182.9 cm) Weight: 176 lb 5.9 oz (80 kg) IBW/kg (Calculated) : 77.6  Adjusted Body Weight:  Vital Signs: Temp: 99.6 F (37.6 C) (06/27 0725) Temp src: Oral (06/27 0725) BP: 119/45 mmHg (06/27 0833) Pulse Rate: 123  (06/27 0833) Intake/Output from previous day: 06/26 0701 - 06/27 0700 In: 2523.3 [I.V.:1823.3; NG/GT:590; IV Piggyback:110] Out: 2 [Stool:2] Intake/Output from this shift: Total I/O In: 209.4 [I.V.:169.4; NG/GT:40] Out: -   Labs:  Basename 01/30/12 0430 01/29/12 0424 01/28/12 1200  WBC 13.0* -- --  HGB 7.9* -- --  PLT 46* -- --  LABCREA -- -- --  CREATININE 4.16* 7.84* 7.69*   Estimated Creatinine Clearance: 21.5 ml/min (by C-G formula based on Cr of 4.16). No results found for this basename: VANCOTROUGH:2,VANCOPEAK:2,VANCORANDOM:2,GENTTROUGH:2,GENTPEAK:2,GENTRANDOM:2,TOBRATROUGH:2,TOBRAPEAK:2,TOBRARND:2,AMIKACINPEAK:2,AMIKACINTROU:2,AMIKACIN:2, in the last 72 hours   Microbiology: Recent Results (from the past 720 hour(s))  MRSA PCR SCREENING     Status: Normal   Collection Time   01/11/12  7:46 AM      Component Value Range Status Comment   MRSA by PCR NEGATIVE  NEGATIVE Final   CULTURE, BLOOD (ROUTINE X 2)     Status: Normal   Collection Time   01/26/12  8:50 PM      Component Value Range Status Comment   Specimen Description BLOOD   Final    Special Requests     Final    Value: LEFT IJ BOTTLES DRAWN AEROBIC AND ANAEROBIC 10CC EACH   Culture  Setup Time [id number]   Final    Culture     Final    Value: GROUP B STREP(S.AGALACTIAE)ISOLATED     STAPHYLOCOCCUS SPECIES (COAGULASE NEGATIVE)     Note: THE SIGNIFICANCE OF ISOLATING THIS ORGANISM FROM A SINGLE SET OF BLOOD CULTURES WHEN MULTIPLE SETS ARE DRAWN IS UNCERTAIN. PLEASE NOTIFY THE MICROBIOLOGY  DEPARTMENT WITHIN ONE WEEK IF SPECIATION AND SENSITIVITIES ARE REQUIRED.     Note: Gram Stain Report Called to,Read Back By and Verified With: LESLIE WILSON @ 1444 01/27/12 BY KRAWS   Report Status 01/29/2012 FINAL   Final    Organism ID, Bacteria GROUP B STREP(S.AGALACTIAE)ISOLATED   Final   URINE CULTURE     Status: Normal   Collection Time   01/26/12  9:39 PM      Component Value Range Status Comment   Specimen Description URINE, CATHETERIZED   Final    Special Requests Normal   Final    Culture  Setup Time [id number]   Final    Colony Count >=100,000 COLONIES/ML   Final    Culture     Final    Value: GROUP B STREP(S.AGALACTIAE)ISOLATED     Note: TESTING AGAINST S. AGALACTIAE NOT ROUTINELY PERFORMED DUE TO PREDICTABILITY OF AMP/PEN/VAN SUSCEPTIBILITY.   Report Status 01/28/2012 FINAL   Final   CULTURE, BLOOD (ROUTINE X 2)     Status: Normal   Collection Time   01/26/12 10:06 PM      Component Value Range Status Comment   Specimen Description BLOOD RIGHT RADIAL A-LINE DRAW   Final    Special Requests BOTTLES DRAWN AEROBIC AND ANAEROBIC [facility]   Final    Culture  Setup Time [id number]   Final    Culture     Final    Value: PSEUDOMONAS AERUGINOSA  Note: Gram Stain Report Called to,Read Back By and Verified With: ROSE COLLOM @ 2150 ON 01/27/12 BY GOLLD   Report Status 01/30/2012 FINAL   Final    Organism ID, Bacteria PSEUDOMONAS AERUGINOSA   Final   MRSA PCR SCREENING     Status: Normal   Collection Time   01/26/12 10:48 PM      Component Value Range Status Comment   MRSA by PCR NEGATIVE  NEGATIVE Final   CULTURE, RESPIRATORY     Status: Normal (Preliminary result)   Collection Time   01/29/12 12:06 AM      Component Value Range Status Comment   Specimen Description TRACHEAL ASPIRATE   Final    Special Requests NONE   Final    Gram Stain     Final    Value: NO WBC SEEN     NO SQUAMOUS EPITHELIAL CELLS SEEN     FEW GRAM POSITIVE COCCI IN PAIRS   Culture FEW GRAM NEGATIVE  RODS   Final    Report Status PENDING   Incomplete   CLOSTRIDIUM DIFFICILE BY PCR     Status: Abnormal   Collection Time   01/30/12  4:00 AM      Component Value Range Status Comment   C difficile by pcr POSITIVE (*) NEGATIVE Final     Medical History: Past Medical History  Diagnosis Date  . Pancytopenia     chronic  . Polysubstance abuse     chronic most notable for alcohol  . Malignant hypertension   . Hepatitis C   . COPD (chronic obstructive pulmonary disease)   . Chronic recurrent pancreatitis     likely secondary to alcoholism  . Smoker   . Alcohol abuse   . Respiratory failure Jan 2012    Hx of VDRF   . Burn   . PUD (peptic ulcer disease)     two small ulcers on 2011 EGD, duodenitis noted on 2012 EGD w/o presence of ulcers  . Hep C w/o coma, chronic   . End-stage renal disease on hemodialysis     HD on MWF, [facility]    Medications:  See med rec  Assessment: Patient is a 56 y.o. M on zosyn day #5 for sepsis and PNA with Vancomycin and cipro d/ced on 6/26.  To resume Cipro for noted Pseudomonas in blood cx from 6/23.  Plan:  1) cipro 400mg  IV q24h 2) continue zosyn 2.25gm IV q8h  Mayerly Kaman P 01/30/2012,9:36 AM

## 2012-01-30 NOTE — Progress Notes (Signed)
Name: DESHANE COTRONEO MRN: [id number] DOB: 05-04-1956  ELECTRONIC ICU PHYSICIAN NOTE  Problem:  Diarrhea, copious  Intervention:  flexiseal ordered C diff ordered  Ilir Mahrt 01/30/2012, 3:52 AM

## 2012-01-30 NOTE — Progress Notes (Signed)
eLink Physician-Brief Progress Note Patient Name: [name] DOB: 1956-01-07 MRN: [id number]  Date of Service  01/30/2012   HPI/Events of Note   No DVT prophylaxis  eICU Interventions  SCD's ordered   Intervention Category Intermediate Interventions: Best-practice therapies (e.g. DVT, beta blocker, etc.)  [name] S. 01/30/2012, 3:49 PM

## 2012-01-30 NOTE — Progress Notes (Addendum)
Name: [name] MRN: [id number] DOB: 1955-09-01    LOS: 4 Date of admit 01/26/2012  5:09 PM  PULMONARY / CRITICAL CARE MEDICINE  BRIEF  56 year old male with PMH relevant for HTN, COPD, Hep C, polysubstance abuse, chronic recurrent pancreatitis, PUD and ESRD on hemodialysis. He initially called EMS from home complaining of chest pain. At arrival EMS found him down in PEA arrest. He was intubated in the field, advanced CPR was initiated and return to spontaneous circulation was obtained after 5 to 6 minutes. Initial EKG showed peaked T waves and at arrival had K of 6.4. He received bicarbonate, calcium and glucose/insuline. At arrival was intubated, unresponsive even to painful stimuli, monitor showed sinus tachycardia.  He has an IO access placed in the field. No family is available to take more history. As per the ER, the patient had missed dialysis for the past week.   KEY SOCIAL HX  - Patient's sister, [name] [phone]). Patient has brothers as well. [name]  Most involved and admitted to patient problems with non compliance and substance abuse   EVENTS 6/25 increasing pressors, fluid bolus for low bp, hypoglycemic requiring D10 6/26 agitation, some bleeding from upper airway, diarrhea overnight--> flexiseal   SUBJECTIVE/OVERNIGHT/INTERVAL HX  6/26 agitation, some bleeding from upper airway, diarrhea overnight--> flexiseal Low grade febrile, mild agitation on WUA On isolation now  Vital Signs: Temp:  [97.8 F (36.6 C)-100 F (37.8 C)] 100 F (37.8 C) (06/27 0400) Pulse Rate:  [98-140] 131  (06/27 0725) Resp:  [5-25] 11  (06/27 0725) BP: (95-157)/(42-85) 140/70 mmHg (06/27 0725) SpO2:  [95 %-100 %] 97 % (06/27 0725) FiO2 (%):  [40 %] 40 % (06/27 0725) Weight:  [78.7 kg (173 lb 8 oz)-80 kg (176 lb 5.9 oz)] 80 kg (176 lb 5.9 oz) (06/27 0453)  Physical Examination: General:  Intubated, mechanically ventilated, no acute distress. Neuro:  Arouses, follows  commands, moves BUEs 3/5,less LE 2/5 HEENT:  PERRL, pink conjunctivae, moist membranes. Neck:  Supple, no JVD   Cardiovascular:  RRR, no M/R/G Lungs:  Bilateral diminished air entry, coarse breath sounds bilaterally, no wheezing.  Abdomen:  soft, mildly distended, diminished bowel sounds but present. Musculoskeletal:  No pedal edema, cyanosis or clubbing. Skin:  No rash. Multiple skin superficial erosions, bruises and crusts.     ASSESSMENT AND PLAN: 56 year old male with PMH relevant for HTN, COPD, Hep C, polysubstance abuse, chronic recurrent pancreatitis, PUD and ESRD on hemodialysis. He initially called EMS from home complaining of chest pain. At arrival EMS found him down in PEA arrest. Advanced CPR was started with return to spontaneous circulation after 5 to 6 minutes. At arrival intubated and unresponsive. EKG showed sinus rhythm with peak T waves. Initial K was 6.4. Chest X ray showed cardiomegaly and pulmonary edema but no focal infiltrates.    Patient Active Problem List  Diagnosis  . Pancytopenia  . SUBSTANCE ABUSE, MULTIPLE  . HYPERTENSION, ESSENTIAL, UNCONTROLLED  . PEPTIC ULCER DISEASE  . Chronic recurrent pancreatitis  . End stage renal disease on dialysis  . Abdominal pain, generalized  . Alcohol abuse  . COPD (chronic obstructive pulmonary disease)  . Neuropathy  . Anxiety  . Syncope  . Protein malnutrition  . PEA (Pulseless electrical activity)  . Acute respiratory failure with hypoxia  . Hyperkalemia       PULMONARY  Lab 01/30/12 0450 01/27/12 0520 01/26/12 2217 01/26/12 1723  PHART 7.356 7.354 6.988* 7.020*  PCO2ART 48.1*  31.5* 58.6* 64.5*  PO2ART 68.4* 88.8 119.0* 511.0*  HCO3 26.3* 18.2* 14.3* 16.6*  O2SAT 93.1 97.1 96.0 100.0   Ventilator Settings: Vent Mode:  [-] CPAP;PSV FiO2 (%):  [40 %] 40 % Set Rate:  [24 bmp] 24 bmp Vt Set:  [550 mL] 550 mL PEEP:  [5 cmH20] 5 cmH20 Pressure Support:  [5 cmH20] 5 cmH20 Plateau Pressure:  [20 cmH20-25  cmH20] 20 cmH20 CXR:  Devices in good posiiton, BL ASD  ETT:  01/26/2012 >>    A:   1) Respiratory failure secondary to cardiac arrest 2) History of COPD 3) Respiratory and metabolic acidosis 4) ? Aspiration pna   P:   SBTs even though on low dose pressors - chk ABG on PS 5/5, close to extubation Albuterol/ipatropium   CARDIOVASCULAR  Lab 01/27/12 1200 01/27/12 0445 01/26/12 2045  TROPONINI 0.48* 0.58* 0.65*  LATICACIDVEN -- -- 0.7  PROBNP -- -- 22037.0*   ECG:  Peak T waves, Sinus rhythm at admit on 6/23 -> normalized 6/24 Lines: Left IJ placed. Adequate position verified on chest X ray.  A:  1) PEA arrest. Unclear etiology, likely metabolic derrangements secondary to non compliance with dialysis and sepsis -  septic/ cardiogenic shock 2) History of hypertension 3) Mild bump in tropnin since admit probably secondary to sepsis  P:  - rewarmed - Did not require vaso, on lower levo - appreciate [place] cards; recommends conservative Rx  RENAL  Lab 01/30/12 0430 01/29/12 0424 01/28/12 1200 01/28/12 0738 01/28/12 0400 01/27/12 0445 01/26/12 2053  NA 134* 125* 129* 128* 130* -- --  K 3.5 5.4* -- -- -- -- --  CL 97 90* 93* 91* 93* -- --  CO2 24 18* 18* 18* 18* -- --  BUN 26* 65* 61* 61* 60* -- --  CREATININE 4.16* 7.84* 7.69* 7.65* 7.50* -- --  CALCIUM 8.1* 7.5* 8.0* 8.2* 7.9* -- --  MG -- -- -- -- -- 1.6 1.9  PHOS -- 7.8* -- -- -- 5.2* 12.8*   Intake/Output      06/26 0701 - 06/27 0700 06/27 0701 - 06/28 0700   I.V. (mL/kg) 1823.3 (22.8)    NG/GT 590    IV Piggyback 110    Total Intake(mL/kg) 2523.3 (31.5)    Stool 2    Total Output 2    Net +2521.3          Foley:  Placed at arrival  A:   1) ESRD on hemodialysis 2) Hyperkalemia, hyponatremia at admit on 01/26/2012. S/p emergent HD on 01/26/12    -  P:  Nephrology input  HD 6/26   GASTROINTESTINAL  Lab 01/29/12 0424 01/26/12 2053  AST -- 42*  ALT -- 27  ALKPHOS -- 102  BILITOT -- 0.2*  PROT --  5.8*  ALBUMIN 1.6* 2.2*     A:  1) History of PUD but lactate normal at admit  P:   1) NGT in place 2)start nepro 3) Protonix IV  HEMATOLOGIC  Lab 01/30/12 0430 01/27/12 0453 01/27/12 0445 01/26/12 2053 01/26/12 1754 01/26/12 1734  HGB 7.9* -- 11.3* 10.3* 12.6* 11.6*  HCT 23.0* -- 33.0* 30.3* 37.0* 34.2*  PLT 46* -- 103* 155 -- 210  INR -- 1.12 -- 1.24 -- --  APTT -- 44* -- 42* -- --   A:  Anemic or critical illness P:   - goal Hb 7 & above ok -upper airway bleeding - ? Trauma from intubation, watch HB - can transfuse if needed  on next HD -chk guiaic  INFECTIOUS  Lab 01/30/12 0430 01/27/12 0445 01/26/12 2053 01/26/12 2045 01/26/12 1734  WBC 13.0* 3.6* 7.3 -- 8.9  PROCALCITON -- -- -- 3.80 --   Cultures:  6/23 urine >> strep agalactiae 6/23 resp >>strep agalactiae 6/23 bld >> pseudomonas, strep agalactiae  A:   1) G Neg sepsis 2) History of MRSA but MRSA Pcr negative on admit 01/26/2012 3) History of Hep C 4) On 6/25: urine and blood cultures positive for Group B Strep Agalactiae  P:  Continue Zosyn , resume cipro  --> await sens on pseud ID input C diff pCR pending - doubt Wound care consult   ENDOCRINE  Lab 01/29/12 2354 01/29/12 1941 01/29/12 1603 01/29/12 1155 01/29/12 0824  GLUCAP 119* 110* 99 70 74   A:   1) Hypoglycemia  P:    Monitor BS q 1 hr  Dextrose 10% at 50 cc/hr and will adjust as necessary  ICU hyperglycemia protocol ordered   NEUROLOGIC  A:  Encephalopathy - improved P:   S/p  hypothermia protocol.  Analgesia and sedation with  Fentanyl prn Not moving BLEs as much - follow Resume home meds once extubated   BEST PRACTICE / DISPOSITION - Level of Care:  ICU - Primary Service:  Critical care - Consultants:  Nephrology - Code Status:  Full code - Diet:  NPO - DVT Px:  Heparin - GI Px:  Protonix - Skin Integrity:  No decubitus ulcers at admission - Social / Family:  No family available at the time of my exam on 01/30/2012.  Social work gave numbe for sister Tyshon Fanning who is main health care provider. Patient has siblings   The patient is critically ill with multiple organ systems failure and requires high complexity decision making for assessment and support, frequent evaluation and titration of therapies, application of advanced monitoring technologies and extensive interpretation of multiple databases.   Critical Care Time devoted to patient care services described in this note is: 45 minutes   Cyril Mourning MD. FCCP. [facility] Pager [phone] If no response call [phone]    01/30/2012 7:43 AM

## 2012-01-30 NOTE — Consult Note (Signed)
Infectious Diseases Initial Consultation  Reason for Consultation: antibiotic management   HPI: [name] is a 56 y.o. male with HTN, COPD, Hep C, polysubstance abuse, chronic recurrent pancreatitis, PUD and ESRD on hemodialysis. Recently discharge within the last month for electrolyte abnormalities secondary to noncomplaince with HD. He initially called EMS from home complaining of chest pain. At arrival EMS found him down in PEA arrest. He was intubated in the field, advanced CPR was initiated and return to spontaneous circulation was obtained after 5 to 6 minutes. Initial EKG showed peaked T waves and at arrival had K of 6.4. He received bicarbonate, calcium and glucose/insuline. At arrival was intubated, unresponsive even to painful stimuli, monitor showed sinus tachycardia. He has an IO access placed in the field.He remains intubated and recently weaned off of vasopressors. He was pan-cultured and started on empiric antibiotics of vanco, piptazo and cipro as his blood cultures isolated bacteria within 24hrs. He has been having diarrhea during his hospitalization, but it is unclear if this had started prior to admission. He was found to have c.difficile infection in addition to having polymicrobial bacteremia and group b strep uti. He has been afebrile for the last 48hrs. His WBC has been trending up from 3.8 to 13.  Abtx: piptazo #5 cipro #3 vanco x 1 Metro IV #1  Past Medical History  Diagnosis Date  . Pancytopenia     chronic  . Polysubstance abuse     chronic most notable for alcohol  . Malignant hypertension   . Hepatitis C   . COPD (chronic obstructive pulmonary disease)   . Chronic recurrent pancreatitis     likely secondary to alcoholism  . Smoker   . Alcohol abuse   . Respiratory failure Jan 2012    Hx of VDRF   . Burn   . PUD (peptic ulcer disease)     two small ulcers on 2011 EGD, duodenitis noted on 2012 EGD w/o presence of ulcers  . Hep C w/o coma, chronic   .  End-stage renal disease on hemodialysis     HD on MWF, [facility]    Allergies:  Allergies  Allergen Reactions  . Penicillins     "childhood reaction"   MEDICATIONS:    . antiseptic oral rinse  15 mL Mouth Rinse QID  . chlorhexidine  15 mL Mouth/Throat BID  . ciprofloxacin  400 mg Intravenous NOW  . ciprofloxacin  400 mg Intravenous Q24H  . feeding supplement  30 mL Per Tube TID  . insulin aspart  0-3 Units Subcutaneous Q4H  . vancomycin  500 mg Oral Q6H   And  . metronidazole  500 mg Intravenous Q8H  . mupirocin cream  1 application Topical BID  . pantoprazole (PROTONIX) IV  40 mg Intravenous QHS  . piperacillin-tazobactam (ZOSYN)  IV  2.25 g Intravenous Q8H  . DISCONTD: heparin subcutaneous  5,000 Units Subcutaneous Q8H    History  Substance Use Topics  . Smoking status: Former Smoker -- 1.0 packs/day for 40 years    Types: Cigarettes  . Smokeless tobacco: Never Used  . Alcohol Use: 7.0 oz/week    14 drink(s) per week     12/19/2011 resumed drinking 4-24 oz cans wine coolers daily     Family History  Problem Relation Age of Onset  . Hypertension Mother   . Stroke Mother   . Alcohol abuse    . Anxiety disorder    . Hyperlipidemia    . Stroke    .  Colon cancer Neg Hx      Review of Systems  Sedated, unable to discuss ROS  OBJECTIVE: Temp:  [97.8 F (36.6 C)-100 F (37.8 C)] 99.7 F (37.6 C) (06/27 1245) Pulse Rate:  [90-140] 122  (06/27 1400) Resp:  [11-25] 20  (06/27 1400) BP: (107-143)/(45-88) 142/71 mmHg (06/27 1400) SpO2:  [95 %-100 %] 99 % (06/27 1400) FiO2 (%):  [40 %] 40 % (06/27 1158) Weight:  [176 lb 5.9 oz (80 kg)] 176 lb 5.9 oz (80 kg) (06/27 0453) BP 130/79  Pulse 103  Temp 99.1 F (37.3 C) (Oral)  Resp 22  Ht 6' (1.829 m)  Wt 176 lb 5.9 oz (80 kg)  BMI 23.92 kg/m2  SpO2 100%  General Appearance:    Sedated, dissheveled, intubated, does not open eyes to verbal stimulus  Head:    Normocephalic, without obvious  abnormality, atraumatic  Eyes:    PERRL, conjunctiva/corneas clear,       Nose:   Nares normal, septum midline, mucosa normal, no drainage   or sinus tenderness  Throat:   Dry lips with ETT in place  Neck:   Supple, symmetrical, trachea midline, no adenopathy;       Left IJ triple lumen     Lungs:     Clear to auscultation bilaterally on anterior, vented  Chest wall:    No tenderness or deformity  Heart:    Regular rate and rhythm, S1 and S2 normal, no murmur, rub   or gallop  Abdomen:     Soft, non-tender, decreased BS; no organomegaly        Extremities:   Extremities normal, atraumatic, no cyanosis or edema  Pulses:   2+ and symmetric all extremities  Skin:   Dry skin; numerous echymoses on forearms.  Lymph nodes:   Cervical, supraclavicular, and axillary nodes normal  Neurologic:   Unable to fully assess while sedated. Moves upper extremities voluntarily   LABS: Results for orders placed during the hospital encounter of 01/26/12 (from the past 48 hour(s))  GLUCOSE, CAPILLARY     Status: Abnormal   Collection Time   01/28/12  3:52 PM      Component Value Range Comment   Glucose-Capillary 37 (*) 70 - 99 mg/dL    Comment 1 Documented in Chart      Comment 2 Notify RN     GLUCOSE, CAPILLARY     Status: Abnormal   Collection Time   01/28/12  3:58 PM      Component Value Range Comment   Glucose-Capillary 55 (*) 70 - 99 mg/dL   GLUCOSE, CAPILLARY     Status: Abnormal   Collection Time   01/28/12  4:35 PM      Component Value Range Comment   Glucose-Capillary 101 (*) 70 - 99 mg/dL   GLUCOSE, CAPILLARY     Status: Abnormal   Collection Time   01/28/12  7:43 PM      Component Value Range Comment   Glucose-Capillary 39 (*) 70 - 99 mg/dL    Comment 1 Documented in Chart      Comment 2 Notify RN     GLUCOSE, CAPILLARY     Status: Abnormal   Collection Time   01/28/12  8:09 PM      Component Value Range Comment   Glucose-Capillary 105 (*) 70 - 99 mg/dL   GLUCOSE, CAPILLARY      Status: Abnormal   Collection Time   01/28/12 11:35 PM  Component Value Range Comment   Glucose-Capillary 52 (*) 70 - 99 mg/dL   GLUCOSE, CAPILLARY     Status: Normal   Collection Time   01/29/12 12:02 AM      Component Value Range Comment   Glucose-Capillary 99  70 - 99 mg/dL   CULTURE, RESPIRATORY     Status: Normal (Preliminary result)   Collection Time   01/29/12 12:06 AM      Component Value Range Comment   Specimen Description TRACHEAL ASPIRATE      Special Requests NONE      Gram Stain        Value: NO WBC SEEN     NO SQUAMOUS EPITHELIAL CELLS SEEN     FEW GRAM POSITIVE COCCI IN PAIRS   Culture FEW GRAM NEGATIVE RODS      Report Status PENDING     GLUCOSE, CAPILLARY     Status: Abnormal   Collection Time   01/29/12  4:03 AM      Component Value Range Comment   Glucose-Capillary 63 (*) 70 - 99 mg/dL   RENAL FUNCTION PANEL     Status: Abnormal   Collection Time   01/29/12  4:24 AM      Component Value Range Comment   Sodium 125 (*) 135 - 145 mEq/L    Potassium 5.4 (*) 3.5 - 5.1 mEq/L    Chloride 90 (*) 96 - 112 mEq/L    CO2 18 (*) 19 - 32 mEq/L    Glucose, Bld 324 (*) 70 - 99 mg/dL    BUN 65 (*) 6 - 23 mg/dL    Creatinine, Ser 1.61 (*) 0.50 - 1.35 mg/dL    Calcium 7.5 (*) 8.4 - 10.5 mg/dL    Phosphorus 7.8 (*) 2.3 - 4.6 mg/dL    Albumin 1.6 (*) 3.5 - 5.2 g/dL    GFR calc non Af Amer 7 (*) >90 mL/min    GFR calc Af Amer 8 (*) >90 mL/min   GLUCOSE, CAPILLARY     Status: Abnormal   Collection Time   01/29/12  4:26 AM      Component Value Range Comment   Glucose-Capillary 139 (*) 70 - 99 mg/dL   GLUCOSE, CAPILLARY     Status: Normal   Collection Time   01/29/12  8:24 AM      Component Value Range Comment   Glucose-Capillary 74  70 - 99 mg/dL   GLUCOSE, CAPILLARY     Status: Normal   Collection Time   01/29/12 11:55 AM      Component Value Range Comment   Glucose-Capillary 70  70 - 99 mg/dL   GLUCOSE, CAPILLARY     Status: Normal   Collection Time   01/29/12   4:03 PM      Component Value Range Comment   Glucose-Capillary 99  70 - 99 mg/dL   GLUCOSE, CAPILLARY     Status: Abnormal   Collection Time   01/29/12  7:41 PM      Component Value Range Comment   Glucose-Capillary 110 (*) 70 - 99 mg/dL   GLUCOSE, CAPILLARY     Status: Abnormal   Collection Time   01/29/12 11:54 PM      Component Value Range Comment   Glucose-Capillary 119 (*) 70 - 99 mg/dL   CLOSTRIDIUM DIFFICILE BY PCR     Status: Abnormal   Collection Time   01/30/12  4:00 AM      Component Value Range Comment  C difficile by pcr POSITIVE (*) NEGATIVE   BASIC METABOLIC PANEL     Status: Abnormal   Collection Time   01/30/12  4:30 AM      Component Value Range Comment   Sodium 134 (*) 135 - 145 mEq/L DELTA CHECK NOTED   Potassium 3.5  3.5 - 5.1 mEq/L DELTA CHECK NOTED   Chloride 97  96 - 112 mEq/L    CO2 24  19 - 32 mEq/L    Glucose, Bld 113 (*) 70 - 99 mg/dL    BUN 26 (*) 6 - 23 mg/dL DELTA CHECK NOTED   Creatinine, Ser 4.16 (*) 0.50 - 1.35 mg/dL DELTA CHECK NOTED   Calcium 8.1 (*) 8.4 - 10.5 mg/dL    GFR calc non Af Amer 15 (*) >90 mL/min    GFR calc Af Amer 17 (*) >90 mL/min   CBC     Status: Abnormal   Collection Time   01/30/12  4:30 AM      Component Value Range Comment   WBC 13.0 (*) 4.0 - 10.5 K/uL    RBC 2.41 (*) 4.22 - 5.81 MIL/uL    Hemoglobin 7.9 (*) 13.0 - 17.0 g/dL    HCT 16.1 (*) 09.6 - 52.0 %    MCV 95.4  78.0 - 100.0 fL    MCH 32.8  26.0 - 34.0 pg    MCHC 34.3  30.0 - 36.0 g/dL    RDW 04.5 (*) 40.9 - 15.5 %    Platelets 46 (*) 150 - 400 K/uL CONSISTENT WITH PREVIOUS RESULT  BLOOD GAS, ARTERIAL     Status: Abnormal   Collection Time   01/30/12  4:50 AM      Component Value Range Comment   FIO2 0.40      Delivery systems VENTILATOR      Mode PRESSURE REGULATED VOLUME CONTROL      VT 0.550      Rate 24      Peep/cpap 5.0      pH, Arterial 7.356  7.350 - 7.450    pCO2 arterial 48.1 (*) 35.0 - 45.0 mmHg    pO2, Arterial 68.4 (*) 80.0 - 100.0 mmHg     Bicarbonate 26.3 (*) 20.0 - 24.0 mEq/L    TCO2 27.8  0 - 100 mmol/L    Acid-Base Excess 1.4  0.0 - 2.0 mmol/L    O2 Saturation 93.1      Patient temperature 98.6      Collection site A-LINE      Drawn by [id number]      Sample type ARTERIAL     POCT I-STAT 3, BLOOD GAS (G3+)     Status: Abnormal   Collection Time   01/30/12  8:26 AM      Component Value Range Comment   pH, Arterial 7.255 (*) 7.350 - 7.450    pCO2 arterial 66.3 (*) 35.0 - 45.0 mmHg    pO2, Arterial 70.0 (*) 80.0 - 100.0 mmHg    Bicarbonate 29.1 (*) 20.0 - 24.0 mEq/L    TCO2 31  0 - 100 mmol/L    O2 Saturation 89.0      Acid-Base Excess 1.0  0.0 - 2.0 mmol/L    Patient temperature 37.8 C      Collection site ARTERIAL LINE      Drawn by Operator      Sample type ARTERIAL      Comment NOTIFIED PHYSICIAN     GLUCOSE, CAPILLARY  Status: Abnormal   Collection Time   01/30/12  8:26 AM      Component Value Range Comment   Glucose-Capillary 100 (*) 70 - 99 mg/dL   PROTIME-INR     Status: Abnormal   Collection Time   01/30/12 10:00 AM      Component Value Range Comment   Prothrombin Time 15.3 (*) 11.6 - 15.2 seconds    INR 1.18  0.00 - 1.49   OCCULT BLOOD X 1 CARD TO LAB, STOOL     Status: Normal   Collection Time   01/30/12 11:26 AM      Component Value Range Comment   Fecal Occult Bld POSITIVE     GLUCOSE, CAPILLARY     Status: Abnormal   Collection Time   01/30/12 12:44 PM      Component Value Range Comment   Glucose-Capillary 125 (*) 70 - 99 mg/dL     MICRO: 1/61 blood: group b strep and CoNs 6/23 blood: pseudomonas 6/23 urine: group b strep 100,000 CFU 6/26 respiratory: GNR ,pending 6/26 cdiff POSITIVE 6/27 wound cx: PENDING  IMAGING: Dg Chest Port 1 View  01/30/2012  *RADIOLOGY REPORT*  Clinical Data: Evaluate endotracheal tube position.  PORTABLE CHEST - 1 VIEW  Comparison: Chest x-ray 01/29/2012.  Findings: An endotracheal tube is in place with tip 7.3 cm above the carina. There is a left-sided  internal jugular central venous catheter with tip terminating in the proximal superior vena cava. A nasogastric tube is seen extending into the stomach, however, the tip of the nasogastric tube extends below the lower margin of the image.  Lung volumes are normal.  There continues to be extensive interstitial and airspace opacities throughout the mid and lower lungs bilaterally, concerning for sequela of large volume aspiration and/or developing multilobar pneumonia.  A component of atelectasis is also likely present.  Small - moderate left pleural effusion and small right-sided pleural effusion have decreased slightly compared to yesterday's examination.  Pulmonary vascular crowding without frank pulmonary edema.  The heart size is upper limits of normal.  Mediastinal contours are unremarkable.  IMPRESSION: 1.  Support apparatus, as above. 2.  Slight improvement in aeration throughout the mid and lower lungs bilaterally, indicative of decreasing bilateral pleural effusions and slight improvement in interstitial and airspace opacities which are favored to represent sequelae of large volume aspiration and/or multilobar pneumonia.  Original Report Authenticated By: Florencia Reasons, M.D.   Dg Chest Port 1 View  01/29/2012  *RADIOLOGY REPORT*  Clinical Data: Follow-up for edema.  The patient is on hemodialysis.  PORTABLE CHEST - 1 VIEW  Comparison: Chest x-ray 01/26/2012.  Findings: An endotracheal tube is in place with tip 6.4 cm above the carina. There is a left-sided internal jugular central venous catheter with tip terminating in the mid superior vena cava. A nasogastric tube is seen extending into the stomach, however, the tip of the nasogastric tube extends below the lower margin of the image.  Significantly worsening bibasilar opacities may reflect a combination of atelectasis and/or consolidation, likely with moderate left and small right-sided pleural effusions.  There appears to be only mild pulmonary  venous congestion.  Cardiac silhouette is completely obscured.  Mediastinal contours are unremarkable allowing for the mild patient rotation to the right.  IMPRESSION: 1.  Significantly worsened bibasilar aeration which may represent areas of atelectasis and/or consolidation (from a large volume aspiration or developing multilobar infection).  There are also moderate left and small right-sided pleural effusions which have significantly  increased. 2.  Support apparatus, as above.  Original Report Authenticated By: Florencia Reasons, M.D.    HISTORICAL MICRO/IMAGING  Assessment/Plan:  Polymicrobial bacteremia = often thought to be a reflection of improper collection vs. Sepsis related to GI source, where bacteria translocates from gut to blood. Group B strep found in blood and urine, suggestive of disseminated disease. PsA also concerning as where the etiology of pseudomonal bacteremia came from. Can treat with cefepime to treat both pathogens.   - Would repeat blood cultures to document clearance  - treat for 14 days  - once patient is stable, consider getting abd/pelvis ct to find GI source of polymicrobial bacteremia,   c.difficile sepsis = continue with po vancomycin and IV metronidazole for sepsis due to c.difficile. After finishing course of therapy for GNR and group b strep bacteremia, will attempt to minimize any unnecessary abtx exposure  Group b strep = if persistent bacteremia, may also need to consider endocarditis work-up. Will check repeat blood cultures now.  Duke Salvia Drue Second MD MPH [facility] [phone]

## 2012-01-30 NOTE — Progress Notes (Signed)
PROGRESS NOTE  Subjective:   [name] is a 56 yo admitted after a PEA arrest in the setting of skipped dialysis, acidosis, hyperkalemia.  Also has hx of  HTN, COPD, Hep C, polysubstance abuse, chronic recurrent pancreatitis, PUD .  He is still tachycardic and is intubated.  Did not respond to my questions.  Nurse was doing oral care / ETT care.   Objective:    Vital Signs:   Temp:  [97.8 F (36.6 C)-100 F (37.8 C)] 100 F (37.8 C) (06/27 0400) Pulse Rate:  [98-140] 131  (06/27 0725) Resp:  [5-25] 11  (06/27 0725) BP: (95-157)/(42-85) 140/70 mmHg (06/27 0725) SpO2:  [95 %-100 %] 97 % (06/27 0725) FiO2 (%):  [40 %] 40 % (06/27 0725) Weight:  [173 lb 8 oz (78.7 kg)-176 lb 5.9 oz (80 kg)] 176 lb 5.9 oz (80 kg) (06/27 0453)  Last BM Date: 01/28/12   24-hour weight change: Weight change: 3.5 oz (0.1 kg)  Weight trends: Filed Weights   01/29/12 0433 01/29/12 1130 01/30/12 0453  Weight: 173 lb 4.5 oz (78.6 kg) 173 lb 8 oz (78.7 kg) 176 lb 5.9 oz (80 kg)    Intake/Output:  06/26 0701 - 06/27 0700 In: 2523.3 [I.V.:1823.3; NG/GT:590; IV Piggyback:110] Out: 2 [Stool:2]     Physical Exam: BP 140/70  Pulse 131  Temp 100 F (37.8 C) (Oral)  Resp 11  Ht 6' (1.829 m)  Wt 176 lb 5.9 oz (80 kg)  BMI 23.92 kg/m2  SpO2 97%  General: Vital signs reviewed and noted.   Head: intubated  Eyes: conjunctivae/corneas clear.  EOM's intact.   Throat: normal  Neck: Supple. Normal carotids. No JVD  Lungs:  On vent  Heart: Regular rate,  tachycardic  Abdomen:  Soft, non-tender, non-distended with normoactive bowel sounds. No hepatomegaly. No rebound/guarding. No abdominal masses.  Extremities: Distal pedal pulses are 2+ .  No edema.    Neurologic: Would not respond  Psych: na    Labs: BMET:  Basename 01/30/12 0430 01/29/12 0424  NA 134* 125*  K 3.5 5.4*  CL 97 90*  CO2 24 18*  GLUCOSE 113* 324*  BUN 26* 65*  CREATININE 4.16* 7.84*  CALCIUM 8.1* 7.5*  MG -- --  PHOS --  7.8*    Liver function tests:  Basename 01/29/12 0424  AST --  ALT --  ALKPHOS --  BILITOT --  PROT --  ALBUMIN 1.6*   No results found for this basename: LIPASE:2,AMYLASE:2 in the last 72 hours  CBC:  Basename 01/30/12 0430  WBC 13.0*  NEUTROABS --  HGB 7.9*  HCT 23.0*  MCV 95.4  PLT 46*    Cardiac Enzymes:  Basename 01/27/12 1200  CKTOTAL 1093*  CKMB 33.2*  TROPONINI 0.48*    Coagulation Studies: No results found for this basename: LABPROT:5,INR:5 in the last 72 hours  Other:   Tele:  Sinus tach  Medications:    Infusions:    . sodium chloride 10 mL/hr at 01/26/12 2152  . sodium chloride 10 mL/hr at 01/26/12 2153  . dextrose    . dextrose 50 mL/hr at 01/29/12 2155  . feeding supplement (NEPRO CARB STEADY) 1,000 mL (01/29/12 2014)  . fentaNYL infusion INTRAVENOUS 200 mcg/hr (01/30/12 0014)  . norepinephrine (LEVOPHED) Adult infusion 5 mcg/min (01/30/12 0146)  . DISCONTD: feeding supplement (NEPRO CARB STEADY)    . DISCONTD: vasopressin (PITRESSIN) infusion - *FOR SHOCK*      Scheduled Medications:    . antiseptic oral  rinse  15 mL Mouth Rinse QID  . chlorhexidine  15 mL Mouth/Throat BID  . feeding supplement  30 mL Per Tube TID  . heparin subcutaneous  5,000 Units Subcutaneous Q8H  . insulin aspart  0-3 Units Subcutaneous Q4H  . pantoprazole (PROTONIX) IV  40 mg Intravenous QHS  . piperacillin-tazobactam (ZOSYN)  IV  2.25 g Intravenous Q8H    Assessment/ Plan:    1. Cardiac arrest - due to PEA arrest.  This was likely due to multiple factors.  He missed dialysis for a week prior to arrest.  He has many chronic medical conditions.   Plans per CCM.  Disposition:  Length of Stay: 4  Vesta Mixer, [name]., MD, [facility] 01/30/2012, 8:24 AM Office [phone] Pager [phone]

## 2012-01-30 NOTE — Progress Notes (Signed)
Patient ID: GURSHAN SETTLEMIRE, male   DOB: 1956-07-05, 56 y.o.   MRN: [id number]  [facility] Progress Note    Subjective:   Pt intubated and sedated   Objective:   BP 119/45  Pulse 123  Temp 99.6 F (37.6 C) (Oral)  Resp 24  Ht 6' (1.829 m)  Wt 80 kg (176 lb 5.9 oz)  BMI 23.92 kg/m2  SpO2 97%  Physical Exam: Gen:WD WN WM intubated and sedated LKG:MWNUU Resp:scattered rhonchi VOZ:DGUYQI Ext:1+pitting edema RLE, trace on LLE, LUE AVG +T/B, laceration on left forearm  Labs: BMET  Lab 01/30/12 0430 01/29/12 0424 01/28/12 1200 01/28/12 0738 01/28/12 0400 01/27/12 2340 01/27/12 1940 01/27/12 0445 01/26/12 2053  NA 134* 125* 129* 128* 130* 129* 130* -- --  K 3.5 5.4* 4.7 3.8 3.7 3.5 3.5 -- --  CL 97 90* 93* 91* 93* 92* 93* -- --  CO2 24 18* 18* 18* 18* 18* 18* -- --  GLUCOSE 113* 324* 70 79 103* 122* 120* -- --  BUN 26* 65* 61* 61* 60* 59* 57* -- --  CREATININE 4.16* 7.84* 7.69* 7.65* 7.50* 7.50* 7.53* -- --  ALBUMIN -- 1.6* -- -- -- -- -- -- 2.2*  CALCIUM 8.1* 7.5* 8.0* 8.2* 7.9* 7.9* 7.9* -- --  PHOS -- 7.8* -- -- -- -- -- 5.2* 12.8*   CBC  Lab 01/30/12 0430 01/27/12 0445 01/26/12 2053 01/26/12 1754 01/26/12 1734  WBC 13.0* 3.6* 7.3 -- 8.9  NEUTROABS -- -- 6.6 -- 7.1  HGB 7.9* 11.3* 10.3* 12.6* --  HCT 23.0* 33.0* 30.3* 37.0* --  MCV 95.4 94.6 95.9 -- 96.6  PLT 46* 103* 155 -- 210    @IMGRELPRIORS @ Medications:      . antiseptic oral rinse  15 mL Mouth Rinse QID  . chlorhexidine  15 mL Mouth/Throat BID  . feeding supplement  30 mL Per Tube TID  . heparin subcutaneous  5,000 Units Subcutaneous Q8H  . insulin aspart  0-3 Units Subcutaneous Q4H  . pantoprazole (PROTONIX) IV  40 mg Intravenous QHS  . piperacillin-tazobactam (ZOSYN)  IV  2.25 g Intravenous Q8H     Assessment/ Plan:   1. PEA arrest- appreciate cards and PCCM input/care. Still requiring pressors 2. VDRF- per PCCM 3. SIRS/ID- blood cultures 2/2 + for GNR and GPC in cath. On  vanc/cipro/zosyn per PCCM.  Now with diarrhea and r/o C. Diff 4. ESRDcont with HD qMWF.  Plan for HD tomorrow. Will keep even per PCCM due to ongoing SIRS/hypotension and need for pressors 5. Anemia:ABLA- Hgb dropped from 11.3 to 7.9.  Will guaiac stool and no heparin HD.  May be source of ongoing hypotension.  Pt has h/o ongoing etoh abuse, pancreatitis, and PUD.  Recommend GI eval if heme + stool/melena 6. Thrombocytopenia-  Worsening over last 48 hours. ?DIC.  Check HIT panel 7. CKD-MBD:per cards 8. Nutrition:low albumin due to etoh abuse  9. Vascular access- +T/B 10. Hyponatremia- should improved with HD. Check cortisol to r/o AI     Skylar Flynt A 01/30/2012, 9:23 AM

## 2012-01-30 NOTE — Progress Notes (Addendum)
ANTIBIOTIC CONSULT NOTE - INITIAL  Pharmacy Consult for Cefepime Indication: Polymicrobial (Group B Strept and PSA) Bacteremia  Allergies  Allergen Reactions  . Penicillins     "childhood reaction"    Patient Measurements: Height: 6' (182.9 cm) Weight: 176 lb 5.9 oz (80 kg) IBW/kg (Calculated) : 77.6   Vital Signs: Temp: 99.1 F (37.3 C) (06/27 1546) Temp src: Oral (06/27 1546) BP: 130/79 mmHg (06/27 1600) Pulse Rate: 103  (06/27 1600) Intake/Output from previous day: 06/26 0701 - 06/27 0700 In: 2523.3 [I.V.:1823.3; NG/GT:590; IV Piggyback:110] Out: 2 [Stool:2] Intake/Output from this shift: Total I/O In: 1647.1 [I.V.:817.1; NG/GT:480; IV Piggyback:350] Out: 290 [Emesis/NG output:50; Stool:240]  Labs:  Basename 01/30/12 0430 01/29/12 0424 01/28/12 1200  WBC 13.0* -- --  HGB 7.9* -- --  PLT 46* -- --  LABCREA -- -- --  CREATININE 4.16* 7.84* 7.69*   Estimated Creatinine Clearance: 21.5 ml/min (by C-G formula based on Cr of 4.16). No results found for this basename: VANCOTROUGH:2,VANCOPEAK:2,VANCORANDOM:2,GENTTROUGH:2,GENTPEAK:2,GENTRANDOM:2,TOBRATROUGH:2,TOBRAPEAK:2,TOBRARND:2,AMIKACINPEAK:2,AMIKACINTROU:2,AMIKACIN:2, in the last 72 hours   Microbiology: Recent Results (from the past 720 hour(s))  MRSA PCR SCREENING     Status: Normal   Collection Time   01/11/12  7:46 AM      Component Value Range Status Comment   MRSA by PCR NEGATIVE  NEGATIVE Final   CULTURE, BLOOD (ROUTINE X 2)     Status: Normal   Collection Time   01/26/12  8:50 PM      Component Value Range Status Comment   Specimen Description BLOOD   Final    Special Requests     Final    Value: LEFT IJ BOTTLES DRAWN AEROBIC AND ANAEROBIC 10CC EACH   Culture  Setup Time [id number]   Final    Culture     Final    Value: GROUP B STREP(S.AGALACTIAE)ISOLATED     STAPHYLOCOCCUS SPECIES (COAGULASE NEGATIVE)     Note: THE SIGNIFICANCE OF ISOLATING THIS ORGANISM FROM A SINGLE SET OF BLOOD CULTURES WHEN  MULTIPLE SETS ARE DRAWN IS UNCERTAIN. PLEASE NOTIFY THE MICROBIOLOGY DEPARTMENT WITHIN ONE WEEK IF SPECIATION AND SENSITIVITIES ARE REQUIRED.     Note: Gram Stain Report Called to,Read Back By and Verified With: LESLIE WILSON @ 1444 01/27/12 BY KRAWS   Report Status 01/29/2012 FINAL   Final    Organism ID, Bacteria GROUP B STREP(S.AGALACTIAE)ISOLATED   Final   URINE CULTURE     Status: Normal   Collection Time   01/26/12  9:39 PM      Component Value Range Status Comment   Specimen Description URINE, CATHETERIZED   Final    Special Requests Normal   Final    Culture  Setup Time [id number]   Final    Colony Count >=100,000 COLONIES/ML   Final    Culture     Final    Value: GROUP B STREP(S.AGALACTIAE)ISOLATED     Note: TESTING AGAINST S. AGALACTIAE NOT ROUTINELY PERFORMED DUE TO PREDICTABILITY OF AMP/PEN/VAN SUSCEPTIBILITY.   Report Status 01/28/2012 FINAL   Final   CULTURE, BLOOD (ROUTINE X 2)     Status: Normal   Collection Time   01/26/12 10:06 PM      Component Value Range Status Comment   Specimen Description BLOOD RIGHT RADIAL A-LINE DRAW   Final    Special Requests BOTTLES DRAWN AEROBIC AND ANAEROBIC [facility]   Final    Culture  Setup Time [id number]   Final    Culture     Final  Value: PSEUDOMONAS AERUGINOSA     Note: Gram Stain Report Called to,Read Back By and Verified With: ROSE COLLOM @ 2150 ON 01/27/12 BY GOLLD   Report Status 01/30/2012 FINAL   Final    Organism ID, Bacteria PSEUDOMONAS AERUGINOSA   Final   MRSA PCR SCREENING     Status: Normal   Collection Time   01/26/12 10:48 PM      Component Value Range Status Comment   MRSA by PCR NEGATIVE  NEGATIVE Final   CULTURE, RESPIRATORY     Status: Normal (Preliminary result)   Collection Time   01/29/12 12:06 AM      Component Value Range Status Comment   Specimen Description TRACHEAL ASPIRATE   Final    Special Requests NONE   Final    Gram Stain     Final    Value: NO WBC SEEN     NO SQUAMOUS EPITHELIAL CELLS  SEEN     FEW GRAM POSITIVE COCCI IN PAIRS   Culture FEW GRAM NEGATIVE RODS   Final    Report Status PENDING   Incomplete   CLOSTRIDIUM DIFFICILE BY PCR     Status: Abnormal   Collection Time   01/30/12  4:00 AM      Component Value Range Status Comment   C difficile by pcr POSITIVE (*) NEGATIVE Final     Medical History: Past Medical History  Diagnosis Date  . Pancytopenia     chronic  . Polysubstance abuse     chronic most notable for alcohol  . Malignant hypertension   . Hepatitis C   . COPD (chronic obstructive pulmonary disease)   . Chronic recurrent pancreatitis     likely secondary to alcoholism  . Smoker   . Alcohol abuse   . Respiratory failure Jan 2012    Hx of VDRF   . Burn   . PUD (peptic ulcer disease)     two small ulcers on 2011 EGD, duodenitis noted on 2012 EGD w/o presence of ulcers  . Hep C w/o coma, chronic   . End-stage renal disease on hemodialysis     HD on MWF, [facility]    Assessment: 56 y.o. M with ESRD to switch from Ciprofloxacin/Vancomycin to Cefepime for treatment of polymicrobial (Group B Strept and Pseudomonas  Aeruginosa) bacteremia. Per the sensitivity results and ID recommendations, Cefepime should cover both pathogens and will need to repeat blood cultures to document clearance.  The patient has ESRD and receives HD on MWF. His last session was on 6/26 and he is planned to receive HD again on 6/28. He received a dose of Ciprofloxacin around 1200 today (PSA is noted to be sensitive to Cipro). Will go ahead and give Cefepime 1g today and start with post-HD dosing on 6/28 after his HD session. We will continue to watch for any changes in his HD schedule and re-access dosing as needed.   The patient is noted to have an allergy listed to penicillins as "childhood reaction" however he has tolerated Zosyn this admission without any complications. Will proceed with Cefepime dosing.   Goal of Therapy:  Eradication of  Infection  Plan:  1. Cefepime 1g IV x 1 dose now 2. Cefepime 2g IV post HD sessions on MWF starting on 6/28.  3. Will continue to follow HD schedule/duration, culture results, LOT, and antibiotic de-escalation plans   Georgina Pillion, PharmD, BCPS Clinical Pharmacist Pager: [phone] 01/30/2012 5:31 PM

## 2012-01-30 NOTE — Plan of Care (Signed)
Problem: Phase II Progression Outcomes Goal: Electrolytes normal or corrected Outcome: Not Met (add Reason) END STAGE RENAL/DIALYSIS DEPENDENT

## 2012-01-31 ENCOUNTER — Inpatient Hospital Stay (HOSPITAL_COMMUNITY): Payer: Medicare Other

## 2012-01-31 ENCOUNTER — Encounter (HOSPITAL_COMMUNITY): Payer: Self-pay | Admitting: Radiology

## 2012-01-31 LAB — CBC
HCT: 20.4 % — ABNORMAL LOW (ref 39.0–52.0)
MCV: 95.8 fL (ref 78.0–100.0)
Platelets: 34 10*3/uL — ABNORMAL LOW (ref 150–400)
RBC: 2.13 MIL/uL — ABNORMAL LOW (ref 4.22–5.81)
RDW: 15.9 % — ABNORMAL HIGH (ref 11.5–15.5)
WBC: 8.5 10*3/uL (ref 4.0–10.5)

## 2012-01-31 LAB — BASIC METABOLIC PANEL
CO2: 23 mEq/L (ref 19–32)
Calcium: 8.2 mg/dL — ABNORMAL LOW (ref 8.4–10.5)
Chloride: 96 mEq/L (ref 96–112)
Creatinine, Ser: 5.14 mg/dL — ABNORMAL HIGH (ref 0.50–1.35)
GFR calc Af Amer: 13 mL/min — ABNORMAL LOW (ref >90)
Sodium: 132 mEq/L — ABNORMAL LOW (ref 135–145)

## 2012-01-31 LAB — GLUCOSE, CAPILLARY
Glucose-Capillary: 113 mg/dL — ABNORMAL HIGH (ref 70–99)
Glucose-Capillary: 105 mg/dL — ABNORMAL HIGH (ref 70–99)
Glucose-Capillary: 93 mg/dL (ref 70–99)

## 2012-01-31 LAB — POCT I-STAT 3, ART BLOOD GAS (G3+)
Acid-base deficit: 2 mmol/L (ref 0.0–2.0)
Bicarbonate: 24.8 mEq/L — ABNORMAL HIGH (ref 20.0–24.0)

## 2012-01-31 LAB — PREPARE RBC (CROSSMATCH)

## 2012-01-31 LAB — CULTURE, RESPIRATORY W GRAM STAIN

## 2012-01-31 MED ORDER — LORAZEPAM 2 MG/ML IJ SOLN
4.0000 mg | Freq: Once | INTRAMUSCULAR | Status: AC
  Administered 2012-01-31: 4 mg via INTRAVENOUS

## 2012-01-31 MED ORDER — LORAZEPAM 2 MG/ML IJ SOLN
INTRAMUSCULAR | Status: AC
  Filled 2012-01-31: qty 1

## 2012-01-31 MED ORDER — PANTOPRAZOLE SODIUM 40 MG IV SOLR
40.0000 mg | Freq: Two times a day (BID) | INTRAVENOUS | Status: DC
  Administered 2012-01-31 – 2012-02-05 (×11): 40 mg via INTRAVENOUS
  Filled 2012-01-31 (×13): qty 40

## 2012-01-31 MED ORDER — NOREPINEPHRINE BITARTRATE 1 MG/ML IJ SOLN
2.0000 ug/min | INTRAVENOUS | Status: DC
  Administered 2012-02-01: 2 ug/min via INTRAVENOUS
  Filled 2012-01-31: qty 4

## 2012-01-31 MED ORDER — DIPHENHYDRAMINE HCL 50 MG/ML IJ SOLN
25.0000 mg | Freq: Once | INTRAMUSCULAR | Status: AC
  Administered 2012-01-31: 25 mg via INTRAVENOUS
  Filled 2012-01-31: qty 1

## 2012-01-31 MED ORDER — IOHEXOL 300 MG/ML  SOLN
20.0000 mL | INTRAMUSCULAR | Status: AC
  Administered 2012-01-31 (×2): 20 mL via ORAL

## 2012-01-31 NOTE — Significant Event (Signed)
Pt noted to have increased agitation associated with increased heart rate and blood pressure.  Not improved with increased fentanyl.  Had partial response to versed.  Has hx of ETOH.  Will give 4 mg ativan IV x one and monitor.  Coralyn Helling, MD 01/31/2012, 3:28 PM

## 2012-01-31 NOTE — Progress Notes (Signed)
PROGRESS NOTE  Subjective:   [name] is a 56 yo admitted after a PEA arrest in the setting of skipped dialysis, acidosis, hyperkalemia.  Also has hx of  HTN, COPD, Hep C, polysubstance abuse, chronic recurrent pancreatitis, PUD .  He is still tachycardic and is intubated.  Did not respond to my questions.  Nurse was doing oral care / ETT care.   Objective:    Vital Signs:   Temp:  [98.6 F (37 C)-99.8 F (37.7 C)] 99.8 F (37.7 C) (06/28 0400) Pulse Rate:  [94-135] 130  (06/28 0900) Resp:  [6-25] 15  (06/28 0900) BP: (105-206)/(58-100) 130/61 mmHg (06/28 0900) SpO2:  [87 %-100 %] 98 % (06/28 0900) FiO2 (%):  [40 %] 40 % (06/28 0900) Weight:  [179 lb 14.3 oz (81.6 kg)] 179 lb 14.3 oz (81.6 kg) (06/28 0500)  Last BM Date: 01/30/12   24-hour weight change: Weight change: 6 lb 6.3 oz (2.9 kg)  Weight trends: Filed Weights   01/29/12 1130 01/30/12 0453 01/31/12 0500  Weight: 173 lb 8 oz (78.7 kg) 176 lb 5.9 oz (80 kg) 179 lb 14.3 oz (81.6 kg)    Intake/Output:  06/27 0701 - 06/28 0700 In: 3237.1 [I.V.:1727.1; NG/GT:1010; IV Piggyback:500] Out: 290 [Emesis/NG output:50; Stool:240]     Physical Exam: BP 130/61  Pulse 130  Temp 99.8 F (37.7 C) (Axillary)  Resp 15  Ht 6' (1.829 m)  Wt 179 lb 14.3 oz (81.6 kg)  BMI 24.40 kg/m2  SpO2 98%  General: Vital signs reviewed and noted.   Head: intubated  Eyes: conjunctivae/corneas clear.  EOM's intact.   Throat: intubated  Neck: Supple. Normal carotids. No JVD  Lungs:  On vent  Heart: Regular rate,  tachycardic  Abdomen:  Soft, non-tender, non-distended with normoactive bowel sounds. No hepatomegaly. No rebound/guarding. No abdominal masses.  Extremities: Distal pedal pulses are 2+ .  No edema.    Neurologic: Would not respond  Psych: na    Labs: BMET:  Basename 01/31/12 0515 01/30/12 0430 01/29/12 0424  NA 132* 134* --  K 3.0* 3.5 --  CL 96 97 --  CO2 23 24 --  GLUCOSE 115* 113* --  BUN 36* 26* --    CREATININE 5.14* 4.16* --  CALCIUM 8.2* 8.1* --  MG -- -- --  PHOS -- -- 7.8*    Liver function tests:  Basename 01/29/12 0424  AST --  ALT --  ALKPHOS --  BILITOT --  PROT --  ALBUMIN 1.6*   No results found for this basename: LIPASE:2,AMYLASE:2 in the last 72 hours  CBC:  Basename 01/31/12 0515 01/30/12 0430  WBC 8.5 13.0*  NEUTROABS -- --  HGB 7.0* 7.9*  HCT 20.4* 23.0*  MCV 95.8 95.4  PLT 34* 46*    Cardiac Enzymes: No results found for this basename: CKTOTAL:4,CKMB:4,TROPONINI:4 in the last 72 hours  Coagulation Studies:  Basename 01/30/12 1000  LABPROT 15.3*  INR 1.18    Other:   Tele:  Sinus tach  Medications:    Infusions:    . sodium chloride 10 mL/hr at 01/26/12 2152  . sodium chloride 10 mL/hr at 01/26/12 2153  . dextrose    . dextrose 50 mL/hr at 01/31/12 0323  . feeding supplement (NEPRO CARB STEADY) 1,000 mL (01/31/12 0041)  . fentaNYL infusion INTRAVENOUS 300 mcg/hr (01/31/12 0427)  . DISCONTD: norepinephrine (LEVOPHED) Adult infusion Stopped (01/30/12 1152)    Scheduled Medications:    . antiseptic oral rinse  15 mL  Mouth Rinse QID  . ceFEPime (MAXIPIME) IV  1 g Intravenous NOW  . ceFEPime (MAXIPIME) IV  2 g Intravenous Q M,W,F-HD  . chlorhexidine  15 mL Mouth/Throat BID  . ciprofloxacin  400 mg Intravenous NOW  . diphenhydrAMINE  25 mg Intravenous Once  . feeding supplement  30 mL Per Tube TID  . insulin aspart  0-3 Units Subcutaneous Q4H  . iohexol  20 mL Oral Q1 Hr x 2  . vancomycin  500 mg Oral Q6H   And  . metronidazole  500 mg Intravenous Q8H  . mupirocin cream  1 application Topical BID  . pantoprazole (PROTONIX) IV  40 mg Intravenous Q12H  . DISCONTD: ceFEPime (MAXIPIME) IV  2 g Intravenous Q8H  . DISCONTD: ciprofloxacin  400 mg Intravenous Q24H  . DISCONTD: pantoprazole (PROTONIX) IV  40 mg Intravenous QHS  . DISCONTD: piperacillin-tazobactam (ZOSYN)  IV  2.25 g Intravenous Q8H    Assessment/ Plan:    1.  Cardiac arrest - due to PEA arrest.  This was  due to multiple factors.  He missed dialysis for a week prior to arrest.   He was hyperkalemic and acidotic.   He has many chronic medical conditions.    2. Anemia:  3. ESRD:    4. Pulmonary : still intubated. Plans per CCM  Plans per CCM.  Disposition:  Length of Stay: 5  Vesta Mixer, [name]., MD, [facility] 01/31/2012, 9:33 AM Office [phone] Pager [phone]

## 2012-01-31 NOTE — Progress Notes (Signed)
Nutrition Follow-up  Intervention:   1. No change in EN regimen at this time 2. RD will continue to follow and adjust EN as needed.   Assessment:  Pt remains intubated and sedated, but close to extubation per MD notes. Respiratory working with pt at this time. RN reports pt not ready for extubation, still with agitation requiring sedation. Continues with HD treatments, plan for another treatment today.  Current EN regimen is Nepro @ 40 ml/hr and 30 ml Pro-stat TID. This is providing 2028 kcal (100%), 123 gm protein (>100%), and 698 ml free water.    Diet Order:  NPO  Meds: Scheduled Meds:   . antiseptic oral rinse  15 mL Mouth Rinse QID  . ceFEPime (MAXIPIME) IV  1 g Intravenous NOW  . ceFEPime (MAXIPIME) IV  2 g Intravenous Q M,W,F-HD  . chlorhexidine  15 mL Mouth/Throat BID  . ciprofloxacin  400 mg Intravenous NOW  . diphenhydrAMINE  25 mg Intravenous Once  . feeding supplement  30 mL Per Tube TID  . insulin aspart  0-3 Units Subcutaneous Q4H  . iohexol  20 mL Oral Q1 Hr x 2  . vancomycin  500 mg Oral Q6H   And  . metronidazole  500 mg Intravenous Q8H  . mupirocin cream  1 application Topical BID  . pantoprazole (PROTONIX) IV  40 mg Intravenous Q12H  . DISCONTD: ceFEPime (MAXIPIME) IV  2 g Intravenous Q8H  . DISCONTD: ciprofloxacin  400 mg Intravenous Q24H  . DISCONTD: pantoprazole (PROTONIX) IV  40 mg Intravenous QHS  . DISCONTD: piperacillin-tazobactam (ZOSYN)  IV  2.25 g Intravenous Q8H   Continuous Infusions:   . sodium chloride 10 mL/hr at 01/26/12 2152  . sodium chloride 10 mL/hr at 01/26/12 2153  . dextrose    . dextrose 50 mL/hr at 01/31/12 0323  . feeding supplement (NEPRO CARB STEADY) 1,000 mL (01/31/12 0041)  . fentaNYL infusion INTRAVENOUS 300 mcg/hr (01/31/12 0427)  . DISCONTD: norepinephrine (LEVOPHED) Adult infusion Stopped (01/30/12 1152)   PRN Meds:.sodium chloride, sodium chloride, sodium chloride, acetaminophen, albuterol, dextrose, fentaNYL, fentaNYL,  heparin, heparin, lidocaine, lidocaine-prilocaine, midazolam, pentafluoroprop-tetrafluoroeth  Labs:  CMP     Component Value Date/Time   NA 132* 01/31/2012 0515   K 3.0* 01/31/2012 0515   CL 96 01/31/2012 0515   CO2 23 01/31/2012 0515   GLUCOSE 115* 01/31/2012 0515   BUN 36* 01/31/2012 0515   CREATININE 5.14* 01/31/2012 0515   CREATININE 8.17* 10/15/2011 1709   CALCIUM 8.2* 01/31/2012 0515   CALCIUM 7.6* 04/04/2011 0315   PROT 5.8* 01/26/2012 2053   ALBUMIN 1.6* 01/29/2012 0424   AST 42* 01/26/2012 2053   ALT 27 01/26/2012 2053   ALKPHOS 102 01/26/2012 2053   BILITOT 0.2* 01/26/2012 2053   GFRNONAA 11* 01/31/2012 0515   GFRAA 13* 01/31/2012 0515     Intake/Output Summary (Last 24 hours) at 01/31/12 0933 Last data filed at 01/31/12 0500  Gross per 24 hour  Intake 3027.67 ml  Output    290 ml  Net 2737.67 ml    Weight Status:  179 lbs, trending up. Positive fluid balance today.  Admission weight: 162 lbs Tmax:37.7 C Ve: 13.2 Re-estimated needs:  2031 kcal, 115-139 gm protein  Nutrition Dx:  Inadequate oral intake r/t inability to eat AEB NPO, vent dependant   Goal:  EN to provide >/=90% estimated nutrition needs, met  Monitor:  Ven/t, weight, labs, HD treatments   Clarene Duke RD, LDN Pager [phone]

## 2012-01-31 NOTE — Progress Notes (Signed)
Pt awake ,alert,eyes open w/occ yet brief eye contact .Pt nodding or shaking head appropriately to "yes/no" questions. Pt shrugged shoulders when asked re comfort . Pt denied pain . Hr suddenly increased from 70's to 150 ,SBP suddenly increased to > 200 . Vent denoted synchrony w/resps . Fentanyl 100 mcg bolus given IV from gtt bag ,then versed 4 mg given IV .SEE also VS  Flow sheet section . E-Link Dr made aware . No changes in cephalocaudel assessment other than the above mentioned neuro  Changes.

## 2012-01-31 NOTE — Progress Notes (Signed)
Name: [name] MRN: [id number] DOB: February 13, 1956    LOS: 5 Date of admit 01/26/2012  5:09 PM  PULMONARY / CRITICAL CARE MEDICINE  BRIEF  56 year old male with PMH relevant for HTN, COPD, Hep C, polysubstance abuse, chronic recurrent pancreatitis, PUD and ESRD on hemodialysis. He initially called EMS from home complaining of chest pain. At arrival EMS found him down in PEA arrest. He was intubated in the field, advanced CPR was initiated and return to spontaneous circulation was obtained after 5 to 6 minutes. Initial EKG showed peaked T waves and at arrival had K of 6.4. He received bicarbonate, calcium and glucose/insuline. At arrival was intubated, unresponsive even to painful stimuli, monitor showed sinus tachycardia.  He has an IO access placed in the field. No family is available to take more history. As per the ER, the patient had missed dialysis for the past week.   KEY SOCIAL HX  - Patient's sister, Donshay Lupinski [phone]). Patient has brothers as well. Ms. Lofaso  Most involved and admitted to patient problems with non compliance and substance abuse   EVENTS 6/25 increasing pressors, fluid bolus for low bp, hypoglycemic requiring D10 6/26 agitation, some bleeding from upper airway, diarrhea overnight--> flexiseal   SUBJECTIVE/OVERNIGHT/INTERVAL HX Off pressors, afebrile, agitation requiring increased fent gtt overnight  Vital Signs: Temp:  [98.6 F (37 C)-99.8 F (37.7 C)] 99.8 F (37.7 C) (06/28 0400) Pulse Rate:  [94-135] 102  (06/28 0800) Resp:  [6-25] 22  (06/28 0800) BP: (105-206)/(58-100) 121/79 mmHg (06/28 0800) SpO2:  [87 %-100 %] 100 % (06/28 0800) FiO2 (%):  [40 %] 40 % (06/28 0849) Weight:  [81.6 kg (179 lb 14.3 oz)] 81.6 kg (179 lb 14.3 oz) (06/28 0500)  Physical Examination: General:  Intubated, mechanically ventilated, no acute distress. Neuro:  Arouses, follows commands, moves BUEs 3/5,less LE 2/5 HEENT:  PERRL, pink conjunctivae, moist  membranes. Neck:  Supple, no JVD   Cardiovascular:  RRR, no M/R/G Lungs:  Bilateral diminished air entry, coarse breath sounds bilaterally, no wheezing.  Abdomen:  soft, mildly distended, diminished bowel sounds but present. Musculoskeletal:  No pedal edema, cyanosis or clubbing. Skin:  No rash. Multiple skin superficial erosions, bruises and crusts.     ASSESSMENT AND PLAN: 56 year old male with PMH relevant for HTN, COPD, Hep C, polysubstance abuse, chronic recurrent pancreatitis, PUD and ESRD on hemodialysis. He initially called EMS from home complaining of chest pain. At arrival EMS found him down in PEA arrest. Advanced CPR was started with return to spontaneous circulation after 5 to 6 minutes. At arrival intubated and unresponsive. EKG showed sinus rhythm with peak T waves. Initial K was 6.4. Chest X ray showed cardiomegaly and pulmonary edema but no focal infiltrates.    Patient Active Problem List  Diagnosis  . Pancytopenia  . SUBSTANCE ABUSE, MULTIPLE  . HYPERTENSION, ESSENTIAL, UNCONTROLLED  . PEPTIC ULCER DISEASE  . Chronic recurrent pancreatitis  . End stage renal disease on dialysis  . Abdominal pain, generalized  . Alcohol abuse  . COPD (chronic obstructive pulmonary disease)  . Neuropathy  . Anxiety  . Syncope  . Protein malnutrition  . PEA (Pulseless electrical activity)  . Acute respiratory failure with hypoxia  . Hyperkalemia  . Bacteremia due to Pseudomonas       PULMONARY  Lab 01/30/12 0826 01/30/12 0450 01/27/12 0520 01/26/12 2217 01/26/12 1723  PHART 7.255* 7.356 7.354 6.988* 7.020*  PCO2ART 66.3* 48.1* 31.5* 58.6* 64.5*  PO2ART 70.0* 68.4*  88.8 119.0* 511.0*  HCO3 29.1* 26.3* 18.2* 14.3* 16.6*  O2SAT 89.0 93.1 97.1 96.0 100.0   Ventilator Settings: Vent Mode:  [-] CPAP FiO2 (%):  [40 %] 40 % Set Rate:  [24 bmp] 24 bmp Vt Set:  [550 mL] 550 mL PEEP:  [5 cmH20] 5 cmH20 Pressure Support:  [5 cmH20] 5 cmH20 Plateau Pressure:  [17 cmH20-23  cmH20] 20 cmH20 CXR:  Devices in good posiiton, BL ASD  ETT:  01/26/2012 >>    A:   1) Respiratory failure secondary to cardiac arrest 2) History of COPD 3) Respiratory and metabolic acidosis 4) ? Aspiration pna   P:   SBTs  - chk ABG on PS 5/5, close to extubation - prefer to dialyse before with fluid removal, prior ABgs have shown resp acidosis Albuterol/ipatropium nebs  CARDIOVASCULAR  Lab 01/27/12 1200 01/27/12 0445 01/26/12 2045  TROPONINI 0.48* 0.58* 0.65*  LATICACIDVEN -- -- 0.7  PROBNP -- -- 22037.0*   ECG:  Peak T waves, Sinus rhythm at admit on 6/23 -> normalized 6/24 Lines: Left IJ placed. Adequate position verified on chest X ray.  A:  1) PEA arrest. Unclear etiology, likely metabolic derrangements secondary to non compliance with dialysis and sepsis -  septic/ cardiogenic shock 2) History of hypertension 3) Mild bump in tropnin since admit probably secondary to sepsis  P:  - rewarmed - Off levo - appreciate [place] cards; recommends conservative Rx  RENAL  Lab 01/31/12 0515 01/30/12 0430 01/29/12 0424 01/28/12 1200 01/28/12 0738 01/27/12 0445 01/26/12 2053  NA 132* 134* 125* 129* 128* -- --  K 3.0* 3.5 -- -- -- -- --  CL 96 97 90* 93* 91* -- --  CO2 23 24 18* 18* 18* -- --  BUN 36* 26* 65* 61* 61* -- --  CREATININE 5.14* 4.16* 7.84* 7.69* 7.65* -- --  CALCIUM 8.2* 8.1* 7.5* 8.0* 8.2* -- --  MG -- -- -- -- -- 1.6 1.9  PHOS -- -- 7.8* -- -- 5.2* 12.8*   Intake/Output      06/27 0701 - 06/28 0700 06/28 0701 - 06/29 0700   I.V. (mL/kg) 1727.1 (21.2)    NG/GT 1010    IV Piggyback 500    Total Intake(mL/kg) 3237.1 (39.7)    Emesis/NG output 50    Stool 240    Total Output 290    Net +2947.1          Foley:  Placed at arrival  A:   1) ESRD on hemodialysis 2) Hyperkalemia, hyponatremia at admit on 01/26/2012. S/p emergent HD on 01/26/12    -  P:  Nephrology input  HD 6/28 with 1 U PRBC   GASTROINTESTINAL  Lab 01/29/12 0424 01/26/12 2053   AST -- 42*  ALT -- 27  ALKPHOS -- 102  BILITOT -- 0.2*  PROT -- 5.8*  ALBUMIN 1.6* 2.2*     A:  1) History of PUD but lactate normal at admit  P:   1) NGT in place 2)on nepro 3) Protonix IV q 12h  HEMATOLOGIC  Lab 01/31/12 0515 01/30/12 1000 01/30/12 0430 01/27/12 0453 01/27/12 0445 01/26/12 2053 01/26/12 1754 01/26/12 1734  HGB 7.0* -- 7.9* -- 11.3* 10.3* 12.6* --  HCT 20.4* -- 23.0* -- 33.0* 30.3* 37.0* --  PLT 34* -- 46* -- 103* 155 -- 210  INR -- 1.18 -- 1.12 -- 1.24 -- --  APTT -- -- -- 44* -- 42* -- --   A:  Anemic of critical  illness / Thrombocytopenia P:   - goal Hb 7 & above ok -upper airway bleeding - ? Trauma from intubation, watch HB - can transfuse if needed on next HD -guiaic POS - ? UGI B vs  Upper airway bleed - hold off GI consult -dc heparin, chk HITTpanel, use SCds  INFECTIOUS  Lab 01/31/12 0515 01/30/12 0430 01/27/12 0445 01/26/12 2053 01/26/12 2045 01/26/12 1734  WBC 8.5 13.0* 3.6* 7.3 -- 8.9  PROCALCITON -- -- -- -- 3.80 --   Cultures:  6/23 urine >> strep agalactiae 6/23 resp >>strep agalactiae 6/23 bld >> pseudomonas, strep agalactiae 6/26 C diff pCR POS  A:   1) Pseudomonas sepsis 2) History of MRSA but MRSA Pcr negative on admit 01/26/2012 3) History of Hep C 4)  Group B Strep Agalactiae bacteremia  P:  Cefepime per ID CT abdomen -PO contrast only to r/o fistula, abdominal source Wound care consult   ENDOCRINE  Lab 01/31/12 0324 01/30/12 2322 01/30/12 1957 01/30/12 1620 01/30/12 1244  GLUCAP 98 113* 75 74 125*   A:   1) Hypoglycemia  P:    Monitor BS q 1 hr  Dextrose 10% at 50 cc/hr and will adjust as necessary  ICU hyperglycemia protocol ordered   NEUROLOGIC  A:  Encephalopathy - improved P:   S/p  hypothermia protocol.  Analgesia and sedation with  Fentanyl prn Not moving BLEs as much - follow Resume home meds once extubated   BEST PRACTICE / DISPOSITION - Level of Care:  ICU - Primary Service:  Critical  care - Consultants:  Nephrology - Code Status:  Full code - Diet:  NPO - DVT Px:  Heparin - GI Px:  Protonix - Skin Integrity:  No decubitus ulcers at admission - Social / Family:  No family available at the time of my exam on 01/31/2012. Attempted to call sister & left message.   sister Sahan Pen  is main health care provider. Patient has siblings   The patient is critically ill with multiple organ systems failure and requires high complexity decision making for assessment and support, frequent evaluation and titration of therapies, application of advanced monitoring technologies and extensive interpretation of multiple databases.   Critical Care Time devoted to patient care services described in this note is: 45 minutes   Cyril Mourning MD. FCCP. [facility] Pager [phone] If no response call [phone]    01/31/2012 8:52 AM

## 2012-01-31 NOTE — Progress Notes (Signed)
Patient ID: ZYRION COEY, male   DOB: 1956-06-06, 56 y.o.   MRN: [id number]  [facility] Progress Note    Subjective:   Intubated/sedated   Objective:   BP 121/79  Pulse 102  Temp 99.8 F (37.7 C) (Axillary)  Resp 22  Ht 6' (1.829 m)  Wt 81.6 kg (179 lb 14.3 oz)  BMI 24.40 kg/m2  SpO2 100%  Physical Exam: Gen:WD/WN, WM intubated/sedated  WUJ:WJXBJ at 127  Resp:occ rhonchi YNW:GNFAOZ Ext:+edema RLE>LLE  Labs: BMET  Lab 01/31/12 0515 01/30/12 0430 01/29/12 0424 01/28/12 1200 01/28/12 0738 01/28/12 0400 01/27/12 2340 01/27/12 0445 01/26/12 2053  NA 132* 134* 125* 129* 128* 130* 129* -- --  K 3.0* 3.5 5.4* 4.7 3.8 3.7 3.5 -- --  CL 96 97 90* 93* 91* 93* 92* -- --  CO2 23 24 18* 18* 18* 18* 18* -- --  GLUCOSE 115* 113* 324* 70 79 103* 122* -- --  BUN 36* 26* 65* 61* 61* 60* 59* -- --  CREATININE 5.14* 4.16* 7.84* 7.69* 7.65* 7.50* 7.50* -- --  ALBUMIN -- -- 1.6* -- -- -- -- -- 2.2*  CALCIUM 8.2* 8.1* 7.5* 8.0* 8.2* 7.9* 7.9* -- --  PHOS -- -- 7.8* -- -- -- -- 5.2* 12.8*   CBC  Lab 01/31/12 0515 01/30/12 0430 01/27/12 0445 01/26/12 2053 01/26/12 1734  WBC 8.5 13.0* 3.6* 7.3 --  NEUTROABS -- -- -- 6.6 7.1  HGB 7.0* 7.9* 11.3* 10.3* --  HCT 20.4* 23.0* 33.0* 30.3* --  MCV 95.8 95.4 94.6 95.9 --  PLT 34* 46* 103* 155 --    @IMGRELPRIORS @ Medications:      . antiseptic oral rinse  15 mL Mouth Rinse QID  . ceFEPime (MAXIPIME) IV  1 g Intravenous NOW  . ceFEPime (MAXIPIME) IV  2 g Intravenous Q M,W,F-HD  . chlorhexidine  15 mL Mouth/Throat BID  . ciprofloxacin  400 mg Intravenous NOW  . feeding supplement  30 mL Per Tube TID  . insulin aspart  0-3 Units Subcutaneous Q4H  . vancomycin  500 mg Oral Q6H   And  . metronidazole  500 mg Intravenous Q8H  . mupirocin cream  1 application Topical BID  . pantoprazole (PROTONIX) IV  40 mg Intravenous QHS  . DISCONTD: ceFEPime (MAXIPIME) IV  2 g Intravenous Q8H  . DISCONTD: ciprofloxacin  400 mg  Intravenous Q24H  . DISCONTD: heparin subcutaneous  5,000 Units Subcutaneous Q8H  . DISCONTD: piperacillin-tazobactam (ZOSYN)  IV  2.25 g Intravenous Q8H     Assessment/ Plan:   1. PEA arrest- appreciate cards and PCCM input/care. Still requiring pressors 2. VDRF- per PCCM 3. SIRS/ID-polymicrobial infection with Psuedomonas and Strep.  ID following and have simplified abx and treatment for 2 weeks.  Will need imaging of GI to look for source 4. C. Diff Colitis- per PCCM/ID 5. ESRDcont with HD qMWF. Plan for HD today. Will keep even per PCCM due to ongoing SIRS/hypotension and need for pressors and use added K bath.  Off pressors 6. Hypokalemia- as above 7. Anemia:ABLA- Hgb dropped from 11.3 to 7.9 and now down to 7. No heparin HD. May be contributing source of ongoing hypotension. Pt has h/o ongoing etoh abuse, pancreatitis, and PUD. Recommend GI eval if heme + stool/melena.  Will transfuse with HD 8. Thrombocytopenia- Worsening over last 48 hours. ?DIC. Check HIT panel 9. Alcoholism and h/o Withdrawals/DT's.  On intermittent versed 10. Nutrition:low albumin due to etoh abuse  11. Vascular access- +T/B  12. Hyponatremia- should improved with HD. Check cortisol to r/o AI  Tlaloc Taddei A 01/31/2012, 8:52 AM

## 2012-02-01 ENCOUNTER — Inpatient Hospital Stay (HOSPITAL_COMMUNITY): Payer: Medicare Other

## 2012-02-01 DIAGNOSIS — R7881 Bacteremia: Secondary | ICD-10-CM

## 2012-02-01 LAB — WOUND CULTURE: Gram Stain: NONE SEEN

## 2012-02-01 LAB — BASIC METABOLIC PANEL
BUN: 19 mg/dL (ref 6–23)
Calcium: 8.3 mg/dL — ABNORMAL LOW (ref 8.4–10.5)
Creatinine, Ser: 3.28 mg/dL — ABNORMAL HIGH (ref 0.50–1.35)
GFR calc non Af Amer: 19 mL/min — ABNORMAL LOW (ref >90)
Glucose, Bld: 146 mg/dL — ABNORMAL HIGH (ref 70–99)
Sodium: 137 mEq/L (ref 135–145)

## 2012-02-01 LAB — TYPE AND SCREEN
ABO/RH(D): O NEG
Antibody Screen: NEGATIVE
Unit division: 0

## 2012-02-01 LAB — GLUCOSE, CAPILLARY
Glucose-Capillary: 112 mg/dL — ABNORMAL HIGH (ref 70–99)
Glucose-Capillary: 128 mg/dL — ABNORMAL HIGH (ref 70–99)

## 2012-02-01 LAB — CBC
MCH: 32.1 pg (ref 26.0–34.0)
MCHC: 33.9 g/dL (ref 30.0–36.0)
Platelets: 40 10*3/uL — ABNORMAL LOW (ref 150–400)
RDW: 17.5 % — ABNORMAL HIGH (ref 11.5–15.5)

## 2012-02-01 MED ORDER — CLONAZEPAM 1 MG PO TABS
1.0000 mg | ORAL_TABLET | Freq: Two times a day (BID) | ORAL | Status: DC
  Administered 2012-02-01 – 2012-02-02 (×4): 1 mg via ORAL
  Filled 2012-02-01 (×5): qty 1

## 2012-02-01 MED ORDER — POTASSIUM CHLORIDE 20 MEQ/15ML (10%) PO LIQD
40.0000 meq | Freq: Once | ORAL | Status: AC
  Administered 2012-02-01: 40 meq
  Filled 2012-02-01: qty 30

## 2012-02-01 MED ORDER — SODIUM CHLORIDE 0.9 % IV SOLN
0.2000 ug/kg/h | INTRAVENOUS | Status: DC
  Administered 2012-02-01: 1.2 ug/kg/h via INTRAVENOUS
  Administered 2012-02-01 (×2): 1 ug/kg/h via INTRAVENOUS
  Administered 2012-02-01 (×2): 1.2 ug/kg/h via INTRAVENOUS
  Administered 2012-02-01: 1 ug/kg/h via INTRAVENOUS
  Administered 2012-02-01: 0.4 ug/kg/h via INTRAVENOUS
  Filled 2012-02-01 (×7): qty 2

## 2012-02-01 MED ORDER — MIDAZOLAM HCL 2 MG/2ML IJ SOLN
2.0000 mg | INTRAMUSCULAR | Status: DC | PRN
  Administered 2012-02-01 – 2012-02-02 (×3): 4 mg via INTRAVENOUS
  Administered 2012-02-03 (×2): 2 mg via INTRAVENOUS
  Filled 2012-02-01 (×3): qty 2
  Filled 2012-02-01 (×2): qty 4
  Filled 2012-02-01: qty 8
  Filled 2012-02-01: qty 4

## 2012-02-01 NOTE — Progress Notes (Signed)
PROGRESS NOTE  Subjective:   [name] is a 56 yo admitted after a PEA arrest in the setting of skipped dialysis, acidosis, hyperkalemia.  Also has hx of  HTN, COPD, Hep C, polysubstance abuse, chronic recurrent pancreatitis, PUD .  He is still tachycardic and is intubated.  Did not respond to my questions.  Nurse was doing oral care / ETT care.  He becomes aggitated when his sedation is reduced.  Objective:    Vital Signs:   Temp:  [98.7 F (37.1 C)-100 F (37.8 C)] 99.5 F (37.5 C) (06/29 0747) Pulse Rate:  [73-128] 113  (06/29 0842) Resp:  [11-27] 19  (06/29 0842) BP: (90-165)/(42-87) 160/73 mmHg (06/29 0747) SpO2:  [88 %-100 %] 100 % (06/29 0842) FiO2 (%):  [40 %] 40 % (06/29 0747) Weight:  [175 lb 14.8 oz (79.8 kg)-182 lb 15.7 oz (83 kg)] 178 lb 2.1 oz (80.8 kg) (06/29 0500)  Last BM Date: 01/31/12   24-hour weight change: Weight change: 3 lb 1.4 oz (1.4 kg)  Weight trends: Filed Weights   01/31/12 1100 01/31/12 1529 02/01/12 0500  Weight: 182 lb 15.7 oz (83 kg) 175 lb 14.8 oz (79.8 kg) 178 lb 2.1 oz (80.8 kg)    Intake/Output:  06/28 0701 - 06/29 0700 In: 3330 [I.V.:2080; Blood:350; NG/GT:650; IV Piggyback:250] Out: 4831 [Stool:800]     Physical Exam: BP 160/73  Pulse 113  Temp 99.5 F (37.5 C) (Axillary)  Resp 19  Ht 6' (1.829 m)  Wt 178 lb 2.1 oz (80.8 kg)  BMI 24.16 kg/m2  SpO2 100%  General: Vital signs reviewed and noted.   Head: intubated  Eyes: conjunctivae/corneas clear.  EOM's intact.   Throat: intubated  Neck: Supple. Normal carotids. No JVD  Lungs:  On vent  Heart: Regular rate,  tachycardic  Abdomen:  Soft, non-tender, non-distended with normoactive bowel sounds. No hepatomegaly. No rebound/guarding. No abdominal masses.  Extremities: Distal pedal pulses are 2+ .  No edema.    Neurologic: Would not respond  Psych: na    Labs: BMET:  Basename 02/01/12 0415 01/31/12 0515  NA 137 132*  K 3.1* 3.0*  CL 99 96  CO2 27 23  GLUCOSE  146* 115*  BUN 19 36*  CREATININE 3.28* 5.14*  CALCIUM 8.3* 8.2*  MG -- --  PHOS -- --    Liver function tests: No results found for this basename: AST:2,ALT:2,ALKPHOS:2,BILITOT:2,PROT:2,ALBUMIN:2 in the last 72 hours No results found for this basename: LIPASE:2,AMYLASE:2 in the last 72 hours  CBC:  Basename 02/01/12 0415 01/31/12 0515  WBC 6.9 8.5  NEUTROABS -- --  HGB 7.5* 7.0*  HCT 22.1* 20.4*  MCV 94.4 95.8  PLT 40* 34*    Cardiac Enzymes: No results found for this basename: CKTOTAL:4,CKMB:4,TROPONINI:4 in the last 72 hours  Coagulation Studies:  Basename 01/30/12 1000  LABPROT 15.3*  INR 1.18    Other:   Tele:  Sinus tach  Medications:    Infusions:    . sodium chloride 10 mL/hr at 01/26/12 2152  . sodium chloride 10 mL/hr at 01/26/12 2153  . dexmedetomidine (PRECEDEX) IV infusion 0.4 mcg/kg/hr (02/01/12 0856)  . dextrose    . dextrose 50 mL/hr at 02/01/12 0012  . feeding supplement (NEPRO CARB STEADY) 1,000 mL (02/01/12 0402)  . fentaNYL infusion INTRAVENOUS 350 mcg/hr (02/01/12 1610)  . norepinephrine (LEVOPHED) Adult infusion 2 mcg/min (02/01/12 0843)    Scheduled Medications:    . antiseptic oral rinse  15 mL Mouth Rinse QID  .  ceFEPime (MAXIPIME) IV  2 g Intravenous Q M,W,F-HD  . chlorhexidine  15 mL Mouth/Throat BID  . clonazePAM  1 mg Oral BID  . diphenhydrAMINE  25 mg Intravenous Once  . feeding supplement  30 mL Per Tube TID  . insulin aspart  0-3 Units Subcutaneous Q4H  . iohexol  20 mL Oral Q1 Hr x 2  . LORazepam      . LORazepam      . LORazepam  4 mg Intravenous Once  . vancomycin  500 mg Oral Q6H   And  . metronidazole  500 mg Intravenous Q8H  . mupirocin cream  1 application Topical BID  . pantoprazole (PROTONIX) IV  40 mg Intravenous Q12H  . potassium chloride  40 mEq Per Tube Once    Assessment/ Plan:    1. Cardiac arrest - due to PEA arrest.  This was  due to multiple factors.  He missed dialysis for a week prior to  arrest.   He was hyperkalemic and severely acidotic.   He has many chronic medical conditions.  2. Anemia:  3. ESRD:    4. Pulmonary : still intubated. Plans per CCM  Plans per CCM.  We will sign off.  His PEA arrest was due to his severe metabolic acidosis and electrolyte abnormalities due to skipping dialysis for a week.  Call for question.   Length of Stay: 6  Vesta Mixer, [name]., MD, [facility] 02/01/2012, 9:02 AM Office [phone] Pager [phone]

## 2012-02-01 NOTE — Progress Notes (Signed)
INFECTIOUS DISEASE PROGRESS NOTE  ID: [name] is a 56 y.o. male with HTN, COPD, Hep C, polysubstance abuse, chronic recurrent pancreatitis, PUD and ESRD on hemodialysis. Recently discharge within the last month for electrolyte abnormalities secondary to noncomplaince with HD. He initially called EMS from home complaining of chest pain. Found down at home s/p rescusciated from PEA code. Found to have polymicrobial bacteremia and c.difficile. Remains critically ill, continues to be ventilated   Subjective: Afebrile. Received blood transfusion in dialysis. plt remain low at 40. Unclear if stool output improved. Received addn sedation due to agitation on vent overnight.  Abtx: #7 of antibiotics vanco PO#3 Metronidazole Iv #3 Cefepime IV #3  Medications:     . antiseptic oral rinse  15 mL Mouth Rinse QID  . ceFEPime (MAXIPIME) IV  2 g Intravenous Q M,W,F-HD  . chlorhexidine  15 mL Mouth/Throat BID  . clonazePAM  1 mg Oral BID  . feeding supplement  30 mL Per Tube TID  . insulin aspart  0-3 Units Subcutaneous Q4H  . LORazepam      . LORazepam      . LORazepam  4 mg Intravenous Once  . vancomycin  500 mg Oral Q6H   And  . metronidazole  500 mg Intravenous Q8H  . mupirocin cream  1 application Topical BID  . pantoprazole (PROTONIX) IV  40 mg Intravenous Q12H  . potassium chloride  40 mEq Per Tube Once    Objective: Vital signs in last 24 hours: Temp:  [99.4 F (37.4 C)-100 F (37.8 C)] 99.8 F (37.7 C) (06/29 1200) Pulse Rate:  [73-128] 103  (06/29 1304) Resp:  [11-27] 22  (06/29 1304) BP: (98-160)/(49-83) 130/76 mmHg (06/29 1200) SpO2:  [88 %-100 %] 95 % (06/29 1304) FiO2 (%):  [40 %] 40 % (06/29 1158) Weight:  [175 lb 14.8 oz (79.8 kg)-178 lb 2.1 oz (80.8 kg)] 178 lb 2.1 oz (80.8 kg) (06/29 0500)  Physical Exam  Constitutional: sedated on vent, disheveled  HENT:  Mouth/Throat: Oropharynx is clear and moist. No oropharyngeal exudate.  Cardiovascular: Normal rate,  regular rhythm and normal heart sounds. Exam reveals no gallop and no friction rub.  No murmur heard.  Pulmonary/Chest: Effort normal and breath sounds normal. No respiratory distress. He has no wheezes.  Abdominal: Soft. Bowel sounds are normal. He exhibits no distension. There is no tenderness.  Lymphadenopathy:  He has no cervical adenopathy.  Neurological: He is alert and oriented to person, place, and time.  Skin: Skin is warm and dry. No rash noted. No erythema.  Psychiatric: He has a normal mood and affect. His behavior is normal.    Lab Results  [facility] 02/01/12 0415 01/31/12 0515  WBC 6.9 8.5  HGB 7.5* 7.0*  HCT 22.1* 20.4*  NA 137 132*  K 3.1* 3.0*  CL 99 96  CO2 27 23  BUN 19 36*  CREATININE 3.28* 5.14*  GLU -- --    Microbiology: Recent Results (from the past 240 hour(s))  CULTURE, BLOOD (ROUTINE X 2)     Status: Normal   Collection Time   01/26/12  8:50 PM      Component Value Range Status Comment   Specimen Description BLOOD   Final    Special Requests     Final    Value: LEFT IJ BOTTLES DRAWN AEROBIC AND ANAEROBIC 10CC EACH   Culture  Setup Time [id number]   Final    Culture     Final  Value: GROUP B STREP(S.AGALACTIAE)ISOLATED     STAPHYLOCOCCUS SPECIES (COAGULASE NEGATIVE)     Note: THE SIGNIFICANCE OF ISOLATING THIS ORGANISM FROM A SINGLE SET OF BLOOD CULTURES WHEN MULTIPLE SETS ARE DRAWN IS UNCERTAIN. PLEASE NOTIFY THE MICROBIOLOGY DEPARTMENT WITHIN ONE WEEK IF SPECIATION AND SENSITIVITIES ARE REQUIRED.     Note: Gram Stain Report Called to,Read Back By and Verified With: LESLIE WILSON @ 1444 01/27/12 BY KRAWS   Report Status 01/29/2012 FINAL   Final    Organism ID, Bacteria GROUP B STREP(S.AGALACTIAE)ISOLATED   Final   URINE CULTURE     Status: Normal   Collection Time   01/26/12  9:39 PM      Component Value Range Status Comment   Specimen Description URINE, CATHETERIZED   Final    Special Requests Normal   Final    Culture  Setup Time  [id number]   Final    Colony Count >=100,000 COLONIES/ML   Final    Culture     Final    Value: GROUP B STREP(S.AGALACTIAE)ISOLATED     Note: TESTING AGAINST S. AGALACTIAE NOT ROUTINELY PERFORMED DUE TO PREDICTABILITY OF AMP/PEN/VAN SUSCEPTIBILITY.   Report Status 01/28/2012 FINAL   Final   CULTURE, BLOOD (ROUTINE X 2)     Status: Normal   Collection Time   01/26/12 10:06 PM      Component Value Range Status Comment   Specimen Description BLOOD RIGHT RADIAL A-LINE DRAW   Final    Special Requests BOTTLES DRAWN AEROBIC AND ANAEROBIC [facility]   Final    Culture  Setup Time [id number]   Final    Culture     Final    Value: PSEUDOMONAS AERUGINOSA     Note: Gram Stain Report Called to,Read Back By and Verified With: ROSE COLLOM @ 2150 ON 01/27/12 BY GOLLD   Report Status 01/30/2012 FINAL   Final    Organism ID, Bacteria PSEUDOMONAS AERUGINOSA   Final   MRSA PCR SCREENING     Status: Normal   Collection Time   01/26/12 10:48 PM      Component Value Range Status Comment   MRSA by PCR NEGATIVE  NEGATIVE Final   CULTURE, RESPIRATORY     Status: Normal   Collection Time   01/29/12 12:06 AM      Component Value Range Status Comment   Specimen Description TRACHEAL ASPIRATE   Final    Special Requests NONE   Final    Gram Stain     Final    Value: NO WBC SEEN     NO SQUAMOUS EPITHELIAL CELLS SEEN     FEW GRAM POSITIVE COCCI IN PAIRS   Culture FEW PSEUDOMONAS AERUGINOSA   Final    Report Status 01/31/2012 FINAL   Final    Organism ID, Bacteria PSEUDOMONAS AERUGINOSA   Final   CLOSTRIDIUM DIFFICILE BY PCR     Status: Abnormal   Collection Time   01/30/12  4:00 AM      Component Value Range Status Comment   C difficile by pcr POSITIVE (*) NEGATIVE Final   WOUND CULTURE     Status: Normal   Collection Time   01/30/12 12:55 PM      Component Value Range Status Comment   Specimen Description WOUND RIGHT LEG   Final    Special Requests NONE   Final    Gram Stain     Final    Value: NO WBC  SEEN     NO  SQUAMOUS EPITHELIAL CELLS SEEN     NO ORGANISMS SEEN   Culture NO GROWTH 2 DAYS   Final    Report Status 02/01/2012 FINAL   Final   CULTURE, BLOOD (SINGLE)     Status: Normal (Preliminary result)   Collection Time   01/30/12  9:35 PM      Component Value Range Status Comment   Specimen Description BLOOD CENTRAL LINE   Final    Special Requests BOTTLES DRAWN AEROBIC AND ANAEROBIC 10CC   Final    Culture  Setup Time 01/31/2012 03:48   Final    Culture     Final    Value:        BLOOD CULTURE RECEIVED NO GROWTH TO DATE CULTURE WILL BE HELD FOR 5 DAYS BEFORE ISSUING A FINAL NEGATIVE REPORT   Report Status PENDING   Incomplete     Studies/Results: Ct Abdomen Pelvis Wo Contrast  01/31/2012  *RADIOLOGY REPORT*  Clinical Data: Synovitis bacteremia.  Evaluate for potential fistula.  CT ABDOMEN AND PELVIS WITHOUT CONTRAST  Technique:  Multidetector CT imaging of the abdomen and pelvis was performed following the standard protocol without intravenous contrast.  Comparison: CT of abdomen and pelvis 04/03/2011.  Findings:  Lung Bases: Extensive air space consolidation throughout the lung bases bilaterally, including what appears to be complete left lower lobe consolidation, with patchy air space disease in the inferior aspect of the lingula, right middle lobe and right lower lobe. Some dependent atelectasis is also noted in the right lower lobe. Small bilateral pleural effusions are incompletely visualized. Calcifications of the mitral annulus.  Abdomen/Pelvis:  The unenhanced appearance of the liver, gallbladder, pancreas and bilateral adrenal glands is unremarkable. The kidneys are atrophic bilaterally, but otherwise unremarkable. Spleen is mildly enlarged (14.1 cm), increased compared to the prior examination.  A rectal bag is in place.  No pathologic distension of bowel.  No ascites or pneumoperitoneum.  No definite pathologic lymphadenopathy identified within the abdomen or pelvis.  Atherosclerosis throughout the abdominal and pelvic vasculature, without definite aneurysm.  Urinary bladder is unremarkable in appearance.  Musculoskeletal: There are no aggressive appearing lytic or blastic lesions noted in the visualized portions of the skeleton. Nondisplaced fractures of the anterolateral aspect of the right sixth, seventh and eighth ribs are incidentally noted.  IMPRESSION: 1.  No acute findings in the abdomen or pelvis. 2.  Extensive air space consolidation throughout the visualized lung bases, particularly in the left lower lobe where there is near complete consolidation of the visualized lung. This presumably represents severe multilobar pneumonia. In addition, there are small bilateral pleural effusions. 3.  There is splenomegaly which has increased compared to prior examination. 4.  Bilateral renal atrophy. 5.  Acute nondisplaced fractures of the anterolateral aspect of the right 6th-8th ribs.  Original Report Authenticated By: Florencia Reasons, M.D.   Dg Chest Port 1 View  02/01/2012  *RADIOLOGY REPORT*  Clinical Data: Pleural effusion  PORTABLE CHEST - 1 VIEW  Comparison:   the previous day's study  Findings: Endotracheal tube, nasogastric tube, and left IJ central line are stable in position.  Mild perihilar and bibasilar interstitial edema, improved since previous exam.  Persistent central pulmonary vascular congestion.  Small left pleural effusion with adjacent atelectasis/consolidation at the left lung base as before.  Stable cardiomegaly.  IMPRESSION:  1.  Some interval improvement in interstitial edema. 2.  Persistent cardiomegaly and small left effusion. 3. Support hardware stable in position.  Original Report Authenticated By: Lysle Rubens  Andria Rhein, M.D.   Dg Chest Port 1 View  01/31/2012  *RADIOLOGY REPORT*  Clinical Data: Shortness of breath.  Pulmonary edema.  Evaluate endotracheal tube.  PORTABLE CHEST - 1 VIEW  Comparison: 1 day prior  Findings: Endotracheal tube  terminates at the level of the clavicles.  Nasogastric extends beyond the  inferior aspect of the film.  Left IJ central line with tip at mid SVC.  Mild cardiomegaly.  Layering small left pleural effusion. Numerous leads and wires project over the chest.  No pneumothorax.  Moderate interstitial edema is similar.  Bibasilar airspace disease is also not significantly changed.  IMPRESSION:  1. No significant change since one day prior. 2.  Interstitial edema with bibasilar airspace disease and a small left pleural effusion.  Original Report Authenticated By: Consuello Bossier, M.D.     Assessment/Plan: Polymicrobial bacteremia (group B strep and Pseudomonas) = often thought to be a reflection of improper collection vs. Sepsis related to GI source, where bacteria translocates from gut to blood. Group B strep found in blood and urine, suggestive of disseminated disease. PsA also concerning as where the etiology of pseudomonal bacteremia came from. Can treat with cefepime to treat both pathogens.  Needs only 7 more days of treatment for a total of 14 days  - once patient is stable, consider getting abd/pelvis ct to find GI source of polymicrobial bacteremia if does not appear to clinically responding to therapy  c.difficile sepsis = continue with po vancomycin and IV metronidazole for sepsis due to c.difficile. -  After finishing course of therapy for GNR and group b strep bacteremia, will attempt to minimize any unnecessary abtx exposure   Thrombocytopenia = presumably low from sepsis, but recommend to work up for DIC  Anemia/GI blood loss= per primary team management. Renal provided 1 unit rbc in HD, hct bumped from 20 to 22, but his baseline is at in low 30s.   Shrika Milos Infectious Diseases 02/01/2012, 1:57 PM

## 2012-02-01 NOTE — Progress Notes (Signed)
Patient ID: [name], male   DOB: 1956/04/26, 56 y.o.   MRN: [id number]  [facility] Progress Note    Subjective:   Sedated/intubated   Objective:   BP 114/56  Pulse 95  Temp 99.5 F (37.5 C) (Axillary)  Resp 20  Ht 6' (1.829 m)  Wt 80.8 kg (178 lb 2.1 oz)  BMI 24.16 kg/m2  SpO2 98%  Physical Exam: CVS:RRR Resp:occ rhonchi ZHY:QMVHQI ONG:EXBMW upper ext edema  Labs: BMET  Lab 02/01/12 0415 01/31/12 0515 01/30/12 0430 01/29/12 0424 01/28/12 1200 01/28/12 0738 01/28/12 0400 01/27/12 0445 01/26/12 2053  NA 137 132* 134* 125* 129* 128* 130* -- --  K 3.1* 3.0* 3.5 5.4* 4.7 3.8 3.7 -- --  CL 99 96 97 90* 93* 91* 93* -- --  CO2 27 23 24  18* 18* 18* 18* -- --  GLUCOSE 146* 115* 113* 324* 70 79 103* -- --  BUN 19 36* 26* 65* 61* 61* 60* -- --  CREATININE 3.28* 5.14* 4.16* 7.84* 7.69* 7.65* 7.50* -- --  ALBUMIN -- -- -- 1.6* -- -- -- -- 2.2*  CALCIUM 8.3* 8.2* 8.1* 7.5* 8.0* 8.2* 7.9* -- --  PHOS -- -- -- 7.8* -- -- -- 5.2* 12.8*   CBC  Lab 02/01/12 0415 01/31/12 0515 01/30/12 0430 01/27/12 0445 01/26/12 2053 01/26/12 1734  WBC 6.9 8.5 13.0* 3.6* -- --  NEUTROABS -- -- -- -- 6.6 7.1  HGB 7.5* 7.0* 7.9* 11.3* -- --  HCT 22.1* 20.4* 23.0* 33.0* -- --  MCV 94.4 95.8 95.4 94.6 -- --  PLT 40* 34* 46* 103* -- --    @IMGRELPRIORS @ Medications:      . antiseptic oral rinse  15 mL Mouth Rinse QID  . ceFEPime (MAXIPIME) IV  2 g Intravenous Q M,W,F-HD  . chlorhexidine  15 mL Mouth/Throat BID  . clonazePAM  1 mg Oral BID  . diphenhydrAMINE  25 mg Intravenous Once  . feeding supplement  30 mL Per Tube TID  . insulin aspart  0-3 Units Subcutaneous Q4H  . LORazepam      . LORazepam      . LORazepam  4 mg Intravenous Once  . vancomycin  500 mg Oral Q6H   And  . metronidazole  500 mg Intravenous Q8H  . mupirocin cream  1 application Topical BID  . pantoprazole (PROTONIX) IV  40 mg Intravenous Q12H  . potassium chloride  40 mEq Per Tube Once      Assessment/ Plan:   1. PEA arrest- appreciate cards and PCCM input/care. Still requiring pressors 2. VDRF- per PCCM 3. SIRS/ID-polymicrobial infection with Psuedomonas and Strep. ID following and have simplified abx and treatment for 2 weeks. Will need imaging of GI to look for source 4. C. Diff Colitis- per PCCM/ID 5. ESRDcont with HD qMWF. Removed 4 Liters yesterday. Off pressors 6. Hypokalemia- as above 7. Anemia:ABLA- Hgb dropped from 11.3 to 7.9 and now down to 7. No heparin HD. May be contributing source of ongoing hypotension. Pt has h/o ongoing etoh abuse, pancreatitis, and PUD. Recommend GI eval if heme + stool/melena. Will transfuse with HD 8. Thrombocytopenia- stable over last 24hrs. ?DIC. Check HIT panel 9. Alcoholism and h/o Withdrawals/DT's. On intermittent versed 10. Nutrition:low albumin due to etoh abuse  11. Vascular access- +T/B 12. Hyponatremia- improved with HD. 13. Agitation- likely will require higher doses of benzo's given his etoh abuse  Naveyah Iacovelli A 02/01/2012, 11:09 AM

## 2012-02-01 NOTE — Progress Notes (Signed)
Name: [name] MRN: [id number] DOB: [birth year]    LOS: 6 Date of admit 01/26/2012  5:09 PM  PULMONARY / CRITICAL CARE MEDICINE  BRIEF  55 year old male with PMH relevant for HTN, COPD, Hep C, polysubstance abuse, chronic recurrent pancreatitis, PUD and ESRD on hemodialysis. He initially called EMS from home complaining of chest pain. At arrival EMS found him down in PEA arrest. He was intubated in the field, advanced CPR was initiated and return to spontaneous circulation was obtained after 5 to 6 minutes. Initial EKG showed peaked T waves and at arrival had K of 6.4. He received bicarbonate, calcium and glucose/insuline. At arrival was intubated, unresponsive even to painful stimuli, monitor showed sinus tachycardia.  He has an IO access placed in the field. No family is available to take more history. As per the ER, the patient had missed dialysis for the past week.   KEY SOCIAL HX  - Patient's sister, Rilyn Upshaw [phone]). Patient has brothers as well. Ms. Kargbo  Most involved and admitted to patient problems with non compliance and substance abuse   EVENTS 6/25 increasing pressors, fluid bolus for low bp, hypoglycemic requiring D10 6/26 agitation, some bleeding from upper airway, diarrhea overnight--> flexiseal 6/28 Severe agitation requiring increased fent gtt overnight, Transfused 1 u on HD CT abd  - fracture rt 6-8 ribs, splenomegaly, LLL consolidation   SUBJECTIVE/OVERNIGHT/INTERVAL HX Remained Off pressors, low gr febrile, Severe agitation requiring increased fent gtt overnight Transfused 1 u on HD  Vital Signs: Temp:  [98.7 F (37.1 C)-100 F (37.8 C)] 100 F (37.8 C) (06/29 0319) Pulse Rate:  [73-130] 104  (06/29 0600) Resp:  [15-27] 24  (06/29 0600) BP: (90-165)/(42-87) 118/70 mmHg (06/29 0400) SpO2:  [88 %-100 %] 100 % (06/29 0600) FiO2 (%):  [40 %] 40 % (06/29 0600) Weight:  [79.8 kg (175 lb 14.8 oz)-83 kg (182 lb 15.7 oz)] 80.8 kg (178 lb 2.1 oz) (06/29  0500)  Physical Examination: General:  Intubated, mechanically ventilated, no acute distress. Neuro:  Arouses, follows commands, moves BUEs 3/5,less LE 2/5 HEENT:  PERRL, pink conjunctivae, moist membranes. Neck:  Supple, no JVD   Cardiovascular:  RRR, no M/R/G Lungs:  Bilateral diminished air entry, coarse breath sounds bilaterally, no wheezing.  Abdomen:  soft, mildly distended, diminished bowel sounds but present. Musculoskeletal:  No pedal edema, cyanosis or clubbing. Skin:  No rash. Multiple skin superficial erosions, bruises and crusts.     ASSESSMENT AND PLAN: 56 year old male with PMH relevant for HTN, COPD, Hep C, polysubstance abuse, chronic recurrent pancreatitis, PUD and ESRD on hemodialysis. He initially called EMS from home complaining of chest pain. At arrival EMS found him down in PEA arrest. Advanced CPR was started with return to spontaneous circulation after 5 to 6 minutes. At arrival intubated and unresponsive. EKG showed sinus rhythm with peak T waves. Initial K was 6.4. Chest X ray showed cardiomegaly and pulmonary edema but no focal infiltrates.    Patient Active Problem List  Diagnosis  . Pancytopenia  . SUBSTANCE ABUSE, MULTIPLE  . HYPERTENSION, ESSENTIAL, UNCONTROLLED  . PEPTIC ULCER DISEASE  . Chronic recurrent pancreatitis  . End stage renal disease on dialysis  . Abdominal pain, generalized  . Alcohol abuse  . COPD (chronic obstructive pulmonary disease)  . Neuropathy  . Anxiety  . Syncope  . Protein malnutrition  . PEA (Pulseless electrical activity)  . Acute respiratory failure with hypoxia  . Hyperkalemia  . Bacteremia due to  Pseudomonas       PULMONARY  Lab 01/31/12 0951 01/30/12 0826 01/30/12 0450 01/27/12 0520 01/26/12 2217  PHART 7.276* 7.255* 7.356 7.354 6.988*  PCO2ART 53.2* 66.3* 48.1* 31.5* 58.6*  PO2ART 87.0 70.0* 68.4* 88.8 119.0*  HCO3 24.8* 29.1* 26.3* 18.2* 14.3*  O2SAT 95.0 89.0 93.1 97.1 96.0   Ventilator  Settings: Vent Mode:  [-] PRVC FiO2 (%):  [40 %] 40 % Set Rate:  [24 bmp] 24 bmp Vt Set:  [550 mL] 550 mL PEEP:  [5 cmH20] 5 cmH20 Pressure Support:  [5 cmH20-8 cmH20] 8 cmH20 Plateau Pressure:  [17 cmH20-25 cmH20] 17 cmH20 CXR: LLL  ASD, rt appears to be clearing 6/29  ETT:  01/26/2012 >>    A:   1) Respiratory failure secondary to cardiac arrest 2) History of COPD 3) Respiratory and metabolic acidosis 4) LLL Aspiration pna 5) Fracture rt 6-8 ribs with cpr   P:   Ct SBTs  -  - prefer to control agitation better before extuban, prior ABgs have shown resp acidosis on PS/CPAP Albuterol/ipatropium nebs  CARDIOVASCULAR  Lab 01/27/12 1200 01/27/12 0445 01/26/12 2045  TROPONINI 0.48* 0.58* 0.65*  LATICACIDVEN -- -- 0.7  PROBNP -- -- 22037.0*   ECG:  Peak T waves, Sinus rhythm at admit on 6/23 -> normalized 6/24 Lines: Left IJ placed. Adequate position verified on chest X ray.  A:  1) PEA arrest. Unclear etiology, likely metabolic derrangements secondary to non compliance with dialysis and sepsis -  septic/ cardiogenic shock 2) History of hypertension 3) Mild bump in tropnin since admit probably secondary to sepsis  P:  - rewarmed - Off levo - appreciate [place] cards; recommends conservative Rx  RENAL  Lab 02/01/12 0415 01/31/12 0515 01/30/12 0430 01/29/12 0424 01/28/12 1200 01/27/12 0445 01/26/12 2053  NA 137 132* 134* 125* 129* -- --  K 3.1* 3.0* -- -- -- -- --  CL 99 96 97 90* 93* -- --  CO2 27 23 24  18* 18* -- --  BUN 19 36* 26* 65* 61* -- --  CREATININE 3.28* 5.14* 4.16* 7.84* 7.69* -- --  CALCIUM 8.3* 8.2* 8.1* 7.5* 8.0* -- --  MG -- -- -- -- -- 1.6 1.9  PHOS -- -- -- 7.8* -- 5.2* 12.8*   Intake/Output      06/28 0701 - 06/29 0700 06/29 0701 - 06/30 0700   I.V. (mL/kg) 2080 (25.7)    Blood 350    NG/GT 650    IV Piggyback 250    Total Intake(mL/kg) 3330 (41.2)    Emesis/NG output     Other 4031    Stool 800    Total Output 4831    Net -1501           Foley:  Placed at arrival  A:   1) ESRD on hemodialysis 2) Hyperkalemia, hyponatremia at admit on 01/26/2012. S/p emergent HD on 01/26/12    -  P:  Nephrology input  HD 6/28 with 1 U PRBC   GASTROINTESTINAL  Lab 01/29/12 0424 01/26/12 2053  AST -- 42*  ALT -- 27  ALKPHOS -- 102  BILITOT -- 0.2*  PROT -- 5.8*  ALBUMIN 1.6* 2.2*     A:  1) History of PUD but lactate normal at admit  P:   1) may need GI input if Hb continues to drop, unclear if occult blood in stool from upper airway source 2)on nepro 3) Protonix IV q 12h  HEMATOLOGIC  Lab 02/01/12 0415 01/31/12  0515 01/30/12 1000 01/30/12 0430 01/27/12 0453 01/27/12 0445 01/26/12 2053  HGB 7.5* 7.0* -- 7.9* -- 11.3* 10.3*  HCT 22.1* 20.4* -- 23.0* -- 33.0* 30.3*  PLT 40* 34* -- 46* -- 103* 155  INR -- -- 1.18 -- 1.12 -- 1.24  APTT -- -- -- -- 44* -- 42*   A:  Anemic of critical illness / Thrombocytopenia P:   - goal Hb 7 & above ok -upper airway bleeding - ? Trauma from intubation, watch HB - can transfuse if needed on next HD -guiaic POS - ? UGI B vs  Upper airway bleed - hold off GI consult -dc heparin, await HITTpanel, use SCds  INFECTIOUS  Lab 02/01/12 0415 01/31/12 0515 01/30/12 0430 01/27/12 0445 01/26/12 2053 01/26/12 2045  WBC 6.9 8.5 13.0* 3.6* 7.3 --  PROCALCITON -- -- -- -- -- 3.80   Cultures:  6/23 urine >> strep agalactiae 6/23 resp >>strep agalactiae 6/23 bld >> pseudomonas, strep agalactiae 6/26 C diff pCR POS  A:   1) Pseudomonas sepsis, CT abdomen 6/28 does not reveal GI source 2) History of MRSA but MRSA Pcr negative on admit 01/26/2012 3) History of Hep C 4)  Group B Strep Agalactiae bacteremia 5) c. Diff colitis  P:  Cefepime per ID PO vanc / IV flagyl Wound care consult   ENDOCRINE  Lab 02/01/12 0317 01/31/12 2324 01/31/12 2009 01/31/12 1607 01/31/12 1207  GLUCAP 118* 96 93 92 105*   A:   1) Hypoglycemia  P:    Monitor BS q 1 hr  Dextrose 10% at 50 cc/hr and will  adjust as necessary  ICU hyperglycemia protocol ordered   NEUROLOGIC  A:  Encephalopathy - improved P:   S/p  hypothermia protocol. Goal RASS 0 to enable wean Analgesia and sedation with  Fentanyl gtt - requiring high dose Add precedex 6/29 Not moving BLEs as much - follow Resume home meds - klonopin   BEST PRACTICE / DISPOSITION - Level of Care:  ICU - Primary Service:  Critical care - Consultants:  Nephrology - Code Status:  Full code - Diet:  NPO - DVT Px:  Heparin - GI Px:  Protonix - Skin Integrity:  No decubitus ulcers at admission - Social / Family:  No family available at the time of my exam on 02/01/2012. Attempted to call sister & left message.   sister Latrel Szymczak  is main health care provider. Patient has siblings   The patient is critically ill with multiple organ systems failure and requires high complexity decision making for assessment and support, frequent evaluation and titration of therapies, application of advanced monitoring technologies and extensive interpretation of multiple databases.   Critical Care Time devoted to patient care services described in this note is: 45 minutes   Cyril Mourning MD. FCCP. [place] [facility] [facility] Pager [phone] If no response call [phone]    02/01/2012 7:51 AM

## 2012-02-02 ENCOUNTER — Inpatient Hospital Stay (HOSPITAL_COMMUNITY): Payer: Medicare Other

## 2012-02-02 LAB — BASIC METABOLIC PANEL
CO2: 23 mEq/L (ref 19–32)
Calcium: 9.4 mg/dL (ref 8.4–10.5)
Creatinine, Ser: 4.31 mg/dL — ABNORMAL HIGH (ref 0.50–1.35)
Glucose, Bld: 109 mg/dL — ABNORMAL HIGH (ref 70–99)

## 2012-02-02 LAB — CBC
MCH: 32.2 pg (ref 26.0–34.0)
MCV: 94.6 fL (ref 78.0–100.0)
Platelets: 46 10*3/uL — ABNORMAL LOW (ref 150–400)
RDW: 16.8 % — ABNORMAL HIGH (ref 11.5–15.5)

## 2012-02-02 MED ORDER — SODIUM CHLORIDE 0.9 % IV SOLN
0.4000 ug/kg/h | INTRAVENOUS | Status: DC
  Administered 2012-02-02 – 2012-02-03 (×8): 1.2 ug/kg/h via INTRAVENOUS
  Filled 2012-02-02 (×8): qty 4

## 2012-02-02 MED ORDER — POTASSIUM CHLORIDE 20 MEQ/15ML (10%) PO LIQD
40.0000 meq | Freq: Once | ORAL | Status: AC
  Administered 2012-02-02: 40 meq
  Filled 2012-02-02: qty 30

## 2012-02-02 MED ORDER — POTASSIUM CHLORIDE 20 MEQ/15ML (10%) PO LIQD
ORAL | Status: AC
  Filled 2012-02-02: qty 30

## 2012-02-02 NOTE — Progress Notes (Signed)
Patient ID: [name], male   DOB: 1956-08-04, 56 y.o.   MRN: [id number]  [facility] Progress Note    Subjective:   Intubated/sedated   Objective:   BP 131/73  Pulse 78  Temp 98.6 F (37 C) (Oral)  Resp 24  Ht 6' (1.829 m)  Wt 83.3 kg (183 lb 10.3 oz)  BMI 24.91 kg/m2  SpO2 97%  Physical Exam: CVS:rrr Resp:occ rhonchi WUJ:[id number], +BS, soft Ext:+1edema RLE, tr on left, Lt AVG +T/B  Labs: BMET  Lab 02/02/12 0500 02/01/12 0415 01/31/12 0515 01/30/12 0430 01/29/12 0424 01/28/12 1200 01/28/12 0738 01/27/12 0445 01/26/12 2053  NA 134* 137 132* 134* 125* 129* 128* -- --  K 3.1* 3.1* 3.0* 3.5 5.4* 4.7 3.8 -- --  CL 99 99 96 97 90* 93* 91* -- --  CO2 23 27 23 24  18* 18* 18* -- --  GLUCOSE 109* 146* 115* 113* 324* 70 79 -- --  BUN 39* 19 36* 26* 65* 61* 61* -- --  CREATININE 4.31* 3.28* 5.14* 4.16* 7.84* 7.69* 7.65* -- --  ALBUMIN -- -- -- -- 1.6* -- -- -- 2.2*  CALCIUM 9.4 8.3* 8.2* 8.1* 7.5* 8.0* 8.2* -- --  PHOS -- -- -- -- 7.8* -- -- 5.2* 12.8*   CBC  Lab 02/02/12 0500 02/01/12 0415 01/31/12 0515 01/30/12 0430 01/26/12 2053 01/26/12 1734  WBC 5.9 6.9 8.5 13.0* -- --  NEUTROABS -- -- -- -- 6.6 7.1  HGB 8.4* 7.5* 7.0* 7.9* -- --  HCT 24.7* 22.1* 20.4* 23.0* -- --  MCV 94.6 94.4 95.8 95.4 -- --  PLT 46* 40* 34* 46* -- --    @IMGRELPRIORS @ Medications:      . antiseptic oral rinse  15 mL Mouth Rinse QID  . ceFEPime (MAXIPIME) IV  2 g Intravenous Q M,W,F-HD  . chlorhexidine  15 mL Mouth/Throat BID  . clonazePAM  1 mg Oral BID  . feeding supplement  30 mL Per Tube TID  . insulin aspart  0-3 Units Subcutaneous Q4H  . vancomycin  500 mg Oral Q6H   And  . metronidazole  500 mg Intravenous Q8H  . mupirocin cream  1 application Topical BID  . pantoprazole (PROTONIX) IV  40 mg Intravenous Q12H  . potassium chloride  40 mEq Per Tube Once  . potassium chloride         Assessment/ Plan:   1. SIRS/Pseudomonas and group B Strep sepsis (unclear  source) 2. C Diff colitis 3. VDRF 4. ESRD (TTS) 5. Alcoholism 6. Noncompliance 7. PUD 8. Anemia:/ACDz and ABLA 9. thrombocytopenia 10. PEA Arrest 11. Malnutrition 12. Hypertension  Plan: 1. Cont with current level of care and wean as per PCCM 2. Cont with HD qTTS 3. Cont with cefepime 4. Cont with supplemental feeds 5. Cont with IV protonix 6. Clonazepam to prevent withdrawals  Diasha Castleman A 02/02/2012, 10:12 AM

## 2012-02-02 NOTE — Progress Notes (Deleted)
Pt nodding or shaking head appropriately to "yes/no" questions. Appears calm yet Hr and B/P   Very increased .Denied pain.shrugged shoulders when asked about comfort .eye contact brief  On/off during conversation .Fentanyl bolus 100 mcg given IV ,  then versed 4 mg given IV . E-Link Dr notified due to sudden onset of elevated  HR( 140's to 150 bpm ) and elevated b/p (sbp > 200).

## 2012-02-02 NOTE — Progress Notes (Signed)
Name: [name] MRN: [id number] DOB: 1956-03-02    LOS: 7 Date of admit 01/26/2012  5:09 PM  PULMONARY / CRITICAL CARE MEDICINE  BRIEF  56 year old male with PMH relevant for HTN, COPD, Hep C, polysubstance abuse, chronic recurrent pancreatitis, PUD and ESRD on hemodialysis. He initially called EMS from home complaining of chest pain. At arrival EMS found him down in PEA arrest. He was intubated in the field, advanced CPR was initiated and return to spontaneous circulation was obtained after 5 to 6 minutes. Initial EKG showed peaked T waves and at arrival had K of 6.4. He received bicarbonate, calcium and glucose/insuline. At arrival was intubated, unresponsive even to painful stimuli, monitor showed sinus tachycardia.  He has an IO access placed in the field. No family is available to take more history. As per the ER, the patient had missed dialysis for the past week.   KEY SOCIAL HX  - Patient's sister, [name] [phone]). Patient has brothers as well. Ms. Sigg  Most involved and admitted to patient problems with non compliance and substance abuse   EVENTS 6/25 increasing pressors, fluid bolus for low bp, hypoglycemic requiring D10 6/26 agitation, some bleeding from upper airway, diarrhea overnight--> flexiseal 6/28 Severe agitation requiring increased fent gtt overnight, Transfused 1 u on HD CT abd  - fracture rt 6-8 ribs, splenomegaly, LLL consolidation 6/28 precedex/ klonopin added with decreased fent gtt   SUBJECTIVE/OVERNIGHT/INTERVAL HX Remained Off pressors, Afebrile, Lesser agitation requiring lower fent gtt overnight Still arouses on suctioning,mouth care etc  Vital Signs: Temp:  [95.3 F (35.2 C)-99.8 F (37.7 C)] 98.6 F (37 C) (06/30 0733) Pulse Rate:  [60-128] 79  (06/30 0700) Resp:  [11-24] 24  (06/30 0700) BP: (102-160)/(48-85) 121/66 mmHg (06/30 0700) SpO2:  [91 %-100 %] 98 % (06/30 0700) FiO2 (%):  [40 %] 40 % (06/30 0700) Weight:  [83.3 kg (183  lb 10.3 oz)] 83.3 kg (183 lb 10.3 oz) (06/30 0500)  Physical Examination: General:  Intubated, mechanically ventilated, no acute distress. Neuro:  Arouses, follows commands, moves BUEs 3/5,less LE 2/5 - RASS -2 currently HEENT:  PERRL, pink conjunctivae, moist membranes. Neck:  Supple, no JVD   Cardiovascular:  RRR, no M/R/G Lungs:  Bilateral diminished air entry, coarse breath sounds bilaterally, no wheezing.  Abdomen:  soft, mildly distended, diminished bowel sounds but present. Musculoskeletal:  No pedal edema, cyanosis or clubbing. Skin:  No rash. Multiple skin superficial erosions, bruises and crusts.     ASSESSMENT AND PLAN: 56 year old male with PMH relevant for HTN, COPD, Hep C, polysubstance abuse, chronic recurrent pancreatitis, PUD and ESRD on hemodialysis. He initially called EMS from home complaining of chest pain. At arrival EMS found him down in PEA arrest. Advanced CPR was started with return to spontaneous circulation after 5 to 6 minutes. At arrival intubated and unresponsive. EKG showed sinus rhythm with peak T waves. Initial K was 6.4. Chest X ray showed cardiomegaly and pulmonary edema but no focal infiltrates.    Patient Active Problem List  Diagnosis  . Pancytopenia  . SUBSTANCE ABUSE, MULTIPLE  . HYPERTENSION, ESSENTIAL, UNCONTROLLED  . PEPTIC ULCER DISEASE  . Chronic recurrent pancreatitis  . End stage renal disease on dialysis  . Abdominal pain, generalized  . Alcohol abuse  . COPD (chronic obstructive pulmonary disease)  . Neuropathy  . Anxiety  . Syncope  . Protein malnutrition  . PEA (Pulseless electrical activity)  . Acute respiratory failure with hypoxia  .  Hyperkalemia  . Bacteremia due to Pseudomonas       PULMONARY  Lab 01/31/12 0951 01/30/12 0826 01/30/12 0450 01/27/12 0520 01/26/12 2217  PHART 7.276* 7.255* 7.356 7.354 6.988*  PCO2ART 53.2* 66.3* 48.1* 31.5* 58.6*  PO2ART 87.0 70.0* 68.4* 88.8 119.0*  HCO3 24.8* 29.1* 26.3* 18.2*  14.3*  O2SAT 95.0 89.0 93.1 97.1 96.0   Ventilator Settings: Vent Mode:  [-] PRVC FiO2 (%):  [40 %] 40 % Set Rate:  [24 bmp] 24 bmp Vt Set:  [550 mL] 550 mL PEEP:  [5 cmH20] 5 cmH20 Pressure Support:  [5 cmH20] 5 cmH20 Plateau Pressure:  [20 cmH20-24 cmH20] 21 cmH20 CXR: LLL  ASD, rt appears to be clearing 6/29  ETT:  01/26/2012 >>    A:   1) Respiratory failure secondary to cardiac arrest 2) History of COPD 3) Respiratory and metabolic acidosis 4) LLL Aspiration pna 5) Fracture rt 6-8 ribs with cpr   P:   Ct SBTs  -  - agitation better controlled & neg balance priro to extubn, prior ABgs have shown resp acidosis on PS/CPAP Albuterol/ipatropium nebs  CARDIOVASCULAR  Lab 01/27/12 1200 01/27/12 0445 01/26/12 2045  TROPONINI 0.48* 0.58* 0.65*  LATICACIDVEN -- -- 0.7  PROBNP -- -- 22037.0*   ECG:  Peak T waves, Sinus rhythm at admit on 6/23 -> normalized 6/24 Lines: Left IJ placed. Adequate position verified on chest X ray.  A:  1) PEA arrest. Unclear etiology, likely metabolic derrangements secondary to non compliance with dialysis and sepsis -  septic/ cardiogenic shock 2) History of hypertension 3) Mild bump in tropnin since admit probably secondary to sepsis  P:  - rewarmed - Off levo - appreciate [place] cards; recommend conservative Rx  RENAL  Lab 02/02/12 0500 02/01/12 0415 01/31/12 0515 01/30/12 0430 01/29/12 0424 01/27/12 0445 01/26/12 2053  NA 134* 137 132* 134* 125* -- --  K 3.1* 3.1* -- -- -- -- --  CL 99 99 96 97 90* -- --  CO2 23 27 23 24  18* -- --  BUN 39* 19 36* 26* 65* -- --  CREATININE 4.31* 3.28* 5.14* 4.16* 7.84* -- --  CALCIUM 9.4 8.3* 8.2* 8.1* 7.5* -- --  MG -- -- -- -- -- 1.6 1.9  PHOS -- -- -- -- 7.8* 5.2* 12.8*   Intake/Output      06/29 0701 - 06/30 0700 06/30 0701 - 07/01 0700   I.V. (mL/kg) 2514.8 (30.2)    Blood     NG/GT 1210    IV Piggyback 300    Total Intake(mL/kg) 4024.8 (48.3)    Other     Stool 800    Total Output  800    Net +3224.8          Foley:  Placed at arrival  A:   1) ESRD on hemodialysis 2) Hyperkalemia, hyponatremia at admit on 01/26/2012. S/p emergent HD on 01/26/12    -  P:  Nephrology input  HD 6/28 with 1 U PRBC, Needs neg balance now that BP better Replete K - ? GI loss   GASTROINTESTINAL  Lab 01/29/12 0424 01/26/12 2053  AST -- 42*  ALT -- 27  ALKPHOS -- 102  BILITOT -- 0.2*  PROT -- 5.8*  ALBUMIN 1.6* 2.2*     A:  1) History of PUD but lactate normal at admit  P:   1) may need GI input - hb stable, unclear if occult blood in stool from upper airway source 2)on nepro  3) Protonix IV q 12h  HEMATOLOGIC  Lab 02/02/12 0500 02/01/12 0415 01/31/12 0515 01/30/12 1000 01/30/12 0430 01/27/12 0453 01/27/12 0445 01/26/12 2053  HGB 8.4* 7.5* 7.0* -- 7.9* -- 11.3* --  HCT 24.7* 22.1* 20.4* -- 23.0* -- 33.0* --  PLT 46* 40* 34* -- 46* -- 103* --  INR -- -- -- 1.18 -- 1.12 -- 1.24  APTT -- -- -- -- -- 44* -- 42*   A:  Anemic of critical illness / Thrombocytopenia P:   - goal Hb 7 & above ok -upper airway bleeding - ? Trauma from intubation, watch HB - can transfuse if needed on next HD -guiaic POS - ? UGI B vs  Upper airway bleed - hold off GI consult -dc heparin, await HITTpanel, use SCds  INFECTIOUS  Lab 02/02/12 0500 02/01/12 0415 01/31/12 0515 01/30/12 0430 01/27/12 0445 01/26/12 2045  WBC 5.9 6.9 8.5 13.0* 3.6* --  PROCALCITON -- -- -- -- -- 3.80   Cultures:  6/23 urine >> strep agalactiae 6/23 resp >>strep agalactiae 6/23 bld >> pseudomonas, strep agalactiae 6/26 C diff pCR POS  ABx Pip/ Tazo 6/23 >> 6/27 cipro 6/23 >>6/27 vanc 6/23 >> 6/27 Cefepime 6/27 >> PO vanc 6/27 >> Flagyl 6/27 >> A:   1) Pseudomonas sepsis, CT abdomen 6/28 does not reveal GI source 2) History of MRSA but MRSA Pcr negative on admit 01/26/2012 3) History of Hep C 4)  Group B Strep Agalactiae bacteremia 5) c. Diff colitis  P:  Cefepime per ID PO vanc / IV flagyl Wound  care consult   ENDOCRINE  Lab 02/02/12 0337 02/02/12 0004 02/01/12 2000 02/01/12 1547 02/01/12 1206  GLUCAP 128* 126* 124* 128* 126*   A:   1) Hypoglycemia  P:    Monitor BS q 1 hr  Dextrose 10% at 50 cc/hr and will adjust as necessary  ICU hyperglycemia protocol ordered   NEUROLOGIC  A:  Encephalopathy - improved P:   S/p  hypothermia protocol. Goal RASS 0 to enable wean Analgesia and sedation with  Fentanyl gtt - lower dose, use methadone if needed Added  precedex & klonopin (home med) 6/29 with good results  Not moving BLEs as much - follow    BEST PRACTICE / DISPOSITION - Level of Care:  ICU - Primary Service:  Critical care - Consultants:  Nephrology - Code Status:  Full code - Diet:  NPO - DVT Px:  Heparin - GI Px:  Protonix - Skin Integrity:  No decubitus ulcers at admission - Social / Family: Updated sister [name]  is main health care provider 6/30. Patient has siblings   The patient is critically ill with multiple organ systems failure and requires high complexity decision making for assessment and support, frequent evaluation and titration of therapies, application of advanced monitoring technologies and extensive interpretation of multiple databases.   Critical Care Time devoted to patient care services described in this note is: 45 minutes   Cyril Mourning MD. FCCP. [place] [facility] Pager [phone] If no response call [phone]    02/02/2012 7:36 AM

## 2012-02-02 NOTE — Progress Notes (Signed)
ANTIBIOTIC CONSULT NOTE - FOLLOW UP  Pharmacy Consult for cefepime Indication: pseudomonas pneumonia/bacterermia and group B strep bacteremia  Allergies  Allergen Reactions  . Penicillins     "childhood reaction"    Patient Measurements: Height: 6' (182.9 cm) Weight: 183 lb 10.3 oz (83.3 kg) IBW/kg (Calculated) : 77.6    Vital Signs: Temp: 99.7 F (37.6 C) (06/30 1106) Temp src: Oral (06/30 1106) BP: 146/64 mmHg (06/30 1106) Pulse Rate: 79  (06/30 1106) Intake/Output from previous day: 06/29 0701 - 06/30 0700 In: 4024.8 [I.V.:2514.8; NG/GT:1210; IV Piggyback:300] Out: 800 [Stool:800] Intake/Output from this shift:    Labs:  Basename 02/02/12 0500 02/01/12 0415 01/31/12 0515  WBC 5.9 6.9 8.5  HGB 8.4* 7.5* 7.0*  PLT 46* 40* 34*  LABCREA -- -- --  CREATININE 4.31* 3.28* 5.14*   Estimated Creatinine Clearance: 20.8 ml/min (by C-G formula based on Cr of 4.31). No results found for this basename: VANCOTROUGH:2,VANCOPEAK:2,VANCORANDOM:2,GENTTROUGH:2,GENTPEAK:2,GENTRANDOM:2,TOBRATROUGH:2,TOBRAPEAK:2,TOBRARND:2,AMIKACINPEAK:2,AMIKACINTROU:2,AMIKACIN:2, in the last 72 hours   Microbiology: Recent Results (from the past 720 hour(s))  MRSA PCR SCREENING     Status: Normal   Collection Time   01/11/12  7:46 AM      Component Value Range Status Comment   MRSA by PCR NEGATIVE  NEGATIVE Final   CULTURE, BLOOD (ROUTINE X 2)     Status: Normal   Collection Time   01/26/12  8:50 PM      Component Value Range Status Comment   Specimen Description BLOOD   Final    Special Requests     Final    Value: LEFT IJ BOTTLES DRAWN AEROBIC AND ANAEROBIC 10CC EACH   Culture  Setup Time [id number]   Final    Culture     Final    Value: GROUP B STREP(S.AGALACTIAE)ISOLATED     STAPHYLOCOCCUS SPECIES (COAGULASE NEGATIVE)     Note: THE SIGNIFICANCE OF ISOLATING THIS ORGANISM FROM A SINGLE SET OF BLOOD CULTURES WHEN MULTIPLE SETS ARE DRAWN IS UNCERTAIN. PLEASE NOTIFY THE MICROBIOLOGY DEPARTMENT  WITHIN ONE WEEK IF SPECIATION AND SENSITIVITIES ARE REQUIRED.     Note: Gram Stain Report Called to,Read Back By and Verified With: LESLIE WILSON @ 1444 01/27/12 BY KRAWS   Report Status 01/29/2012 FINAL   Final    Organism ID, Bacteria GROUP B STREP(S.AGALACTIAE)ISOLATED   Final   URINE CULTURE     Status: Normal   Collection Time   01/26/12  9:39 PM      Component Value Range Status Comment   Specimen Description URINE, CATHETERIZED   Final    Special Requests Normal   Final    Culture  Setup Time [id number]   Final    Colony Count >=100,000 COLONIES/ML   Final    Culture     Final    Value: GROUP B STREP(S.AGALACTIAE)ISOLATED     Note: TESTING AGAINST S. AGALACTIAE NOT ROUTINELY PERFORMED DUE TO PREDICTABILITY OF AMP/PEN/VAN SUSCEPTIBILITY.   Report Status 01/28/2012 FINAL   Final   CULTURE, BLOOD (ROUTINE X 2)     Status: Normal   Collection Time   01/26/12 10:06 PM      Component Value Range Status Comment   Specimen Description BLOOD RIGHT RADIAL A-LINE DRAW   Final    Special Requests BOTTLES DRAWN AEROBIC AND ANAEROBIC [facility]   Final    Culture  Setup Time [id number]   Final    Culture     Final    Value: PSEUDOMONAS AERUGINOSA     Note: Gram  Stain Report Called to,Read Back By and Verified With: ROSE COLLOM @ 2150 ON 01/27/12 BY GOLLD   Report Status 01/30/2012 FINAL   Final    Organism ID, Bacteria PSEUDOMONAS AERUGINOSA   Final   MRSA PCR SCREENING     Status: Normal   Collection Time   01/26/12 10:48 PM      Component Value Range Status Comment   MRSA by PCR NEGATIVE  NEGATIVE Final   CULTURE, RESPIRATORY     Status: Normal   Collection Time   01/29/12 12:06 AM      Component Value Range Status Comment   Specimen Description TRACHEAL ASPIRATE   Final    Special Requests NONE   Final    Gram Stain     Final    Value: NO WBC SEEN     NO SQUAMOUS EPITHELIAL CELLS SEEN     FEW GRAM POSITIVE COCCI IN PAIRS   Culture FEW PSEUDOMONAS AERUGINOSA   Final    Report  Status 01/31/2012 FINAL   Final    Organism ID, Bacteria PSEUDOMONAS AERUGINOSA   Final   CLOSTRIDIUM DIFFICILE BY PCR     Status: Abnormal   Collection Time   01/30/12  4:00 AM      Component Value Range Status Comment   C difficile by pcr POSITIVE (*) NEGATIVE Final   WOUND CULTURE     Status: Normal   Collection Time   01/30/12 12:55 PM      Component Value Range Status Comment   Specimen Description WOUND RIGHT LEG   Final    Special Requests NONE   Final    Gram Stain     Final    Value: NO WBC SEEN     NO SQUAMOUS EPITHELIAL CELLS SEEN     NO ORGANISMS SEEN   Culture NO GROWTH 2 DAYS   Final    Report Status 02/01/2012 FINAL   Final   CULTURE, BLOOD (SINGLE)     Status: Normal (Preliminary result)   Collection Time   01/30/12  9:35 PM      Component Value Range Status Comment   Specimen Description BLOOD CENTRAL LINE   Final    Special Requests BOTTLES DRAWN AEROBIC AND ANAEROBIC 10CC   Final    Culture  Setup Time 01/31/2012 03:48   Final    Culture     Final    Value:        BLOOD CULTURE RECEIVED NO GROWTH TO DATE CULTURE WILL BE HELD FOR 5 DAYS BEFORE ISSUING A FINAL NEGATIVE REPORT   Report Status PENDING   Incomplete     Anti-infectives     Start     Dose/Rate Route Frequency Ordered Stop   01/31/12 1200   ceFEPIme (MAXIPIME) 2 g in dextrose 5 % 50 mL IVPB        2 g 100 mL/hr over 30 Minutes Intravenous Every M-W-F (Hemodialysis) 01/30/12 1736     01/31/12 1000   ciprofloxacin (CIPRO) IVPB 400 mg  Status:  Discontinued        400 mg 200 mL/hr over 60 Minutes Intravenous Every 24 hours 01/30/12 1006 01/30/12 1705   01/30/12 1800   ceFEPIme (MAXIPIME) 1 g in dextrose 5 % 50 mL IVPB        1 g 100 mL/hr over 30 Minutes Intravenous NOW 01/30/12 1736 01/30/12 2020   01/30/12 1730   ceFEPIme (MAXIPIME) 2 g in dextrose 5 % 50 mL IVPB  Status:  Discontinued        2 g 100 mL/hr over 30 Minutes Intravenous 3 times per day 01/30/12 1724 01/30/12 1732   01/30/12 1200    vancomycin (VANCOCIN) 50 mg/mL oral solution 500 mg        500 mg Oral 4 times per day 01/30/12 1001 02/13/12 1159   01/30/12 1100   metroNIDAZOLE (FLAGYL) IVPB 500 mg        500 mg 100 mL/hr over 60 Minutes Intravenous Every 8 hours 01/30/12 1001 02/13/12 1059   01/30/12 1015   ciprofloxacin (CIPRO) IVPB 400 mg        400 mg 200 mL/hr over 60 Minutes Intravenous NOW 01/30/12 1006 01/30/12 1251   01/29/12 1200   vancomycin (VANCOCIN) 750 mg in sodium chloride 0.9 % 150 mL IVPB  Status:  Discontinued        750 mg 150 mL/hr over 60 Minutes Intravenous Every M-W-F (Hemodialysis) 01/29/12 0615 01/29/12 0755   01/28/12 2308   ciprofloxacin (CIPRO) IVPB 400 mg  Status:  Discontinued        400 mg 200 mL/hr over 60 Minutes Intravenous Every 24 hours 01/28/12 0941 01/29/12 0755   01/27/12 2230   ciprofloxacin (CIPRO) IVPB 400 mg  Status:  Discontinued        400 mg 200 mL/hr over 60 Minutes Intravenous Every 12 hours 01/27/12 2209 01/28/12 0941   01/26/12 2300   vancomycin (VANCOCIN) 1,500 mg in sodium chloride 0.9 % 500 mL IVPB        1,500 mg 250 mL/hr over 120 Minutes Intravenous  Once 01/26/12 2120 01/27/12 0226   01/26/12 2200   piperacillin-tazobactam (ZOSYN) IVPB 2.25 g  Status:  Discontinued        2.25 g 100 mL/hr over 30 Minutes Intravenous 3 times per day 01/26/12 2118 01/30/12 1705          Assessment: Patient is a 56 y.o with cefepime started on 6/28 for pseudomonas PNA/bacteriemia and Group B strep bacteremia.  Plan to treat with abx for 14 days per ID.     Plan:  1) continue cefepime 2 gm after each HD  Ginna Schuur P 02/02/2012,1:12 PM

## 2012-02-02 NOTE — Progress Notes (Signed)
INFECTIOUS DISEASE PROGRESS NOTE  ID: [name] is a 56 y.o. male with HTN, COPD, Hep C, polysubstance abuse, chronic recurrent pancreatitis, PUD and ESRD on hemodialysis. Recently discharge within the last month for electrolyte abnormalities secondary to noncomplaince with HD. He initially called EMS from home complaining of chest pain. Found down at home s/p rescusciated from PEA code. Found to have polymicrobial bacteremia and c.difficile. Remains critically ill, continues to be ventilated   Subjective: Afebrile. However, a few readings of 96 and 95.56F, unclear if that is correct. Remains on 40% FIO2  Abtx: #8 of antibiotics vanco PO#3 Metronidazole Iv #3 Cefepime IV #3  Medications:     . antiseptic oral rinse  15 mL Mouth Rinse QID  . ceFEPime (MAXIPIME) IV  2 g Intravenous Q M,W,F-HD  . chlorhexidine  15 mL Mouth/Throat BID  . clonazePAM  1 mg Oral BID  . feeding supplement  30 mL Per Tube TID  . insulin aspart  0-3 Units Subcutaneous Q4H  . vancomycin  500 mg Oral Q6H   And  . metronidazole  500 mg Intravenous Q8H  . mupirocin cream  1 application Topical BID  . pantoprazole (PROTONIX) IV  40 mg Intravenous Q12H  . potassium chloride  40 mEq Per Tube Once  . potassium chloride        Objective: Vital signs in last 24 hours: Temp:  [95.3 F (35.2 C)-99.8 F (37.7 C)] 98.6 F (37 C) (06/30 0733) Pulse Rate:  [60-154] 78  (06/30 0900) Resp:  [16-40] 24  (06/30 0900) BP: (102-226)/(48-92) 131/73 mmHg (06/30 0900) SpO2:  [92 %-100 %] 97 % (06/30 0900) FiO2 (%):  [40 %] 40 % (06/30 0800) Weight:  [183 lb 10.3 oz (83.3 kg)] 183 lb 10.3 oz (83.3 kg) (06/30 0500)  Physical Exam  Constitutional: sedated on vent, disheveled  HENT: ETT in place Mouth/Throat: Oropharynx is clear and moist. No oropharyngeal exudate.  Cardiovascular: Normal rate, regular rhythm and normal heart sounds. Exam reveals no gallop and no friction rub.  No murmur heard.  Pulmonary/Chest: mild  rhonchi bilaterally. No respiratory distress. He has no wheezes.  Abdominal: Soft. Bowel sounds are normal. He exhibits no distension. There is no tenderness.  Lymphadenopathy:  He has no cervical adenopathy.  Neurological: sedated Skin: Skin is warm and dry. Some areas of dry flaky skin and ecchymosis   Lab Results  Basename 02/02/12 0500 02/01/12 0415  WBC 5.9 6.9  HGB 8.4* 7.5*  HCT 24.7* 22.1*  NA 134* 137  K 3.1* 3.1*  CL 99 99  CO2 23 27  BUN 39* 19  CREATININE 4.31* 3.28*  GLU -- --    Microbiology: Recent Results (from the past 240 hour(s))  CULTURE, BLOOD (ROUTINE X 2)     Status: Normal   Collection Time   01/26/12  8:50 PM      Component Value Range Status Comment   Specimen Description BLOOD   Final    Special Requests     Final    Value: LEFT IJ BOTTLES DRAWN AEROBIC AND ANAEROBIC 10CC EACH   Culture  Setup Time [id number]   Final    Culture     Final    Value: GROUP B STREP(S.AGALACTIAE)ISOLATED     STAPHYLOCOCCUS SPECIES (COAGULASE NEGATIVE)     Note: THE SIGNIFICANCE OF ISOLATING THIS ORGANISM FROM A SINGLE SET OF BLOOD CULTURES WHEN MULTIPLE SETS ARE DRAWN IS UNCERTAIN. PLEASE NOTIFY THE MICROBIOLOGY DEPARTMENT WITHIN ONE WEEK IF SPECIATION AND  SENSITIVITIES ARE REQUIRED.     Note: Gram Stain Report Called to,Read Back By and Verified With: LESLIE WILSON @ 1444 01/27/12 BY KRAWS   Report Status 01/29/2012 FINAL   Final    Organism ID, Bacteria GROUP B STREP(S.AGALACTIAE)ISOLATED   Final   URINE CULTURE     Status: Normal   Collection Time   01/26/12  9:39 PM      Component Value Range Status Comment   Specimen Description URINE, CATHETERIZED   Final    Special Requests Normal   Final    Culture  Setup Time [id number]   Final    Colony Count >=100,000 COLONIES/ML   Final    Culture     Final    Value: GROUP B STREP(S.AGALACTIAE)ISOLATED     Note: TESTING AGAINST S. AGALACTIAE NOT ROUTINELY PERFORMED DUE TO PREDICTABILITY OF AMP/PEN/VAN  SUSCEPTIBILITY.   Report Status 01/28/2012 FINAL   Final   CULTURE, BLOOD (ROUTINE X 2)     Status: Normal   Collection Time   01/26/12 10:06 PM      Component Value Range Status Comment   Specimen Description BLOOD RIGHT RADIAL A-LINE DRAW   Final    Special Requests BOTTLES DRAWN AEROBIC AND ANAEROBIC [facility]   Final    Culture  Setup Time [id number]   Final    Culture     Final    Value: PSEUDOMONAS AERUGINOSA     Note: Gram Stain Report Called to,Read Back By and Verified With: ROSE COLLOM @ 2150 ON 01/27/12 BY GOLLD   Report Status 01/30/2012 FINAL   Final    Organism ID, Bacteria PSEUDOMONAS AERUGINOSA   Final   MRSA PCR SCREENING     Status: Normal   Collection Time   01/26/12 10:48 PM      Component Value Range Status Comment   MRSA by PCR NEGATIVE  NEGATIVE Final   CULTURE, RESPIRATORY     Status: Normal   Collection Time   01/29/12 12:06 AM      Component Value Range Status Comment   Specimen Description TRACHEAL ASPIRATE   Final    Special Requests NONE   Final    Gram Stain     Final    Value: NO WBC SEEN     NO SQUAMOUS EPITHELIAL CELLS SEEN     FEW GRAM POSITIVE COCCI IN PAIRS   Culture FEW PSEUDOMONAS AERUGINOSA   Final    Report Status 01/31/2012 FINAL   Final    Organism ID, Bacteria PSEUDOMONAS AERUGINOSA   Final   CLOSTRIDIUM DIFFICILE BY PCR     Status: Abnormal   Collection Time   01/30/12  4:00 AM      Component Value Range Status Comment   C difficile by pcr POSITIVE (*) NEGATIVE Final   WOUND CULTURE     Status: Normal   Collection Time   01/30/12 12:55 PM      Component Value Range Status Comment   Specimen Description WOUND RIGHT LEG   Final    Special Requests NONE   Final    Gram Stain     Final    Value: NO WBC SEEN     NO SQUAMOUS EPITHELIAL CELLS SEEN     NO ORGANISMS SEEN   Culture NO GROWTH 2 DAYS   Final    Report Status 02/01/2012 FINAL   Final   CULTURE, BLOOD (SINGLE)     Status: Normal (Preliminary result)   Collection Time  01/30/12  9:35 PM      Component Value Range Status Comment   Specimen Description BLOOD CENTRAL LINE   Final    Special Requests BOTTLES DRAWN AEROBIC AND ANAEROBIC 10CC   Final    Culture  Setup Time 01/31/2012 03:48   Final    Culture     Final    Value:        BLOOD CULTURE RECEIVED NO GROWTH TO DATE CULTURE WILL BE HELD FOR 5 DAYS BEFORE ISSUING A FINAL NEGATIVE REPORT   Report Status PENDING   Incomplete     Studies/Results: Ct Abdomen Pelvis Wo Contrast  01/31/2012  *RADIOLOGY REPORT*  Clinical Data: Synovitis bacteremia.  Evaluate for potential fistula.  CT ABDOMEN AND PELVIS WITHOUT CONTRAST  Technique:  Multidetector CT imaging of the abdomen and pelvis was performed following the standard protocol without intravenous contrast.  Comparison: CT of abdomen and pelvis 04/03/2011.  Findings:  Lung Bases: Extensive air space consolidation throughout the lung bases bilaterally, including what appears to be complete left lower lobe consolidation, with patchy air space disease in the inferior aspect of the lingula, right middle lobe and right lower lobe. Some dependent atelectasis is also noted in the right lower lobe. Small bilateral pleural effusions are incompletely visualized. Calcifications of the mitral annulus.  Abdomen/Pelvis:  The unenhanced appearance of the liver, gallbladder, pancreas and bilateral adrenal glands is unremarkable. The kidneys are atrophic bilaterally, but otherwise unremarkable. Spleen is mildly enlarged (14.1 cm), increased compared to the prior examination.  A rectal bag is in place.  No pathologic distension of bowel.  No ascites or pneumoperitoneum.  No definite pathologic lymphadenopathy identified within the abdomen or pelvis. Atherosclerosis throughout the abdominal and pelvic vasculature, without definite aneurysm.  Urinary bladder is unremarkable in appearance.  Musculoskeletal: There are no aggressive appearing lytic or blastic lesions noted in the visualized  portions of the skeleton. Nondisplaced fractures of the anterolateral aspect of the right sixth, seventh and eighth ribs are incidentally noted.  IMPRESSION: 1.  No acute findings in the abdomen or pelvis. 2.  Extensive air space consolidation throughout the visualized lung bases, particularly in the left lower lobe where there is near complete consolidation of the visualized lung. This presumably represents severe multilobar pneumonia. In addition, there are small bilateral pleural effusions. 3.  There is splenomegaly which has increased compared to prior examination. 4.  Bilateral renal atrophy. 5.  Acute nondisplaced fractures of the anterolateral aspect of the right 6th-8th ribs.  Original Report Authenticated By: Florencia Reasons, M.D.   Dg Chest Port 1 View  02/02/2012  *RADIOLOGY REPORT*  Clinical Data: Pneumonia  PORTABLE CHEST - 1 VIEW  Comparison:   the previous day's study  Findings: Endotracheal tube, nasogastric tube, and left IJ central line are stable in position.  Dense left lower lung consolidation / atelectasis with probable adjacent effusion.  Mild perihilar and bibasilar interstitial and patchy alveolar edema or infiltrates as before.  Heart size appears mildly enlarged.  IMPRESSION:  1.  Little change from previous day's portable exam.  Original Report Authenticated By: Osa Craver, M.D.   Dg Chest Port 1 View  02/01/2012  *RADIOLOGY REPORT*  Clinical Data: Pleural effusion  PORTABLE CHEST - 1 VIEW  Comparison:   the previous day's study  Findings: Endotracheal tube, nasogastric tube, and left IJ central line are stable in position.  Mild perihilar and bibasilar interstitial edema, improved since previous exam.  Persistent central pulmonary vascular congestion.  Small left pleural effusion with adjacent atelectasis/consolidation at the left lung base as before.  Stable cardiomegaly.  IMPRESSION:  1.  Some interval improvement in interstitial edema. 2.  Persistent cardiomegaly  and small left effusion. 3. Support hardware stable in position.  Original Report Authenticated By: Osa Craver, M.D.     Assessment/Plan: sepsis due to Pseudomonas pneumonia and bacteremia plus disseminated group B strep infection of blood and urine.  Polymicrobial bacteremia (group B strep and Pseudomonas) = initially thought to be  sepsis related to GI source, but CT scan on 6/28 did not suggest any GI abn, other than splenomegaly. . Group B strep and pseudomonas can both be treated with cefepime.would treat for a  total of 14 days for bacteremia nad pneumonia  Pseudomonas pna = treat for 14 days total  c.difficile sepsis = continue with po vancomycin and IV metronidazole for sepsis due to c.difficile. -  After finishing course of therapy for GNR and group b strep bacteremia, will attempt to minimize any unnecessary abtx exposure   Thrombocytopenia = presumably low from sepsis, slightly improved today at 46 up from 40.  Anemia/GI blood loss= per primary team management.    Jimma Ortman Infectious Diseases 02/02/2012, 9:47 AM

## 2012-02-03 ENCOUNTER — Inpatient Hospital Stay (HOSPITAL_COMMUNITY): Payer: Medicare Other

## 2012-02-03 DIAGNOSIS — A0472 Enterocolitis due to Clostridium difficile, not specified as recurrent: Secondary | ICD-10-CM | POA: Diagnosis present

## 2012-02-03 DIAGNOSIS — A401 Sepsis due to streptococcus, group B: Secondary | ICD-10-CM | POA: Diagnosis present

## 2012-02-03 DIAGNOSIS — D696 Thrombocytopenia, unspecified: Secondary | ICD-10-CM

## 2012-02-03 DIAGNOSIS — D649 Anemia, unspecified: Secondary | ICD-10-CM

## 2012-02-03 LAB — BASIC METABOLIC PANEL
CO2: 22 mEq/L (ref 19–32)
Chloride: 96 mEq/L (ref 96–112)
GFR calc non Af Amer: 12 mL/min — ABNORMAL LOW (ref >90)
Glucose, Bld: 114 mg/dL — ABNORMAL HIGH (ref 70–99)
Potassium: 3.5 mEq/L (ref 3.5–5.1)
Sodium: 130 mEq/L — ABNORMAL LOW (ref 135–145)

## 2012-02-03 LAB — GLUCOSE, CAPILLARY: Glucose-Capillary: 126 mg/dL — ABNORMAL HIGH (ref 70–99)

## 2012-02-03 LAB — HEPARIN INDUCED THROMBOCYTOPENIA PNL
Heparin Induced Plt Ab: NEGATIVE
UFH Low Dose 0.1 IU/mL: 0 % Release
UFH Low Dose 0.1 IU/mL: 0 % Release
UFH Low Dose 0.5 IU/mL: 0 % Release
UFH Low Dose 0.5 IU/mL: 0 % Release

## 2012-02-03 LAB — CBC
HCT: 26.2 % — ABNORMAL LOW (ref 39.0–52.0)
MCHC: 33.6 g/dL (ref 30.0–36.0)
Platelets: 72 10*3/uL — ABNORMAL LOW (ref 150–400)
RDW: 16.5 % — ABNORMAL HIGH (ref 11.5–15.5)
WBC: 8 10*3/uL (ref 4.0–10.5)

## 2012-02-03 LAB — PROCALCITONIN: Procalcitonin: 3.23 ng/mL

## 2012-02-03 MED ORDER — CLONAZEPAM 1 MG PO TABS
1.0000 mg | ORAL_TABLET | Freq: Three times a day (TID) | ORAL | Status: DC
  Administered 2012-02-03 – 2012-02-14 (×30): 1 mg via ORAL
  Filled 2012-02-03 (×2): qty 1
  Filled 2012-02-03: qty 2
  Filled 2012-02-03 (×6): qty 1
  Filled 2012-02-03: qty 2
  Filled 2012-02-03 (×13): qty 1
  Filled 2012-02-03: qty 2
  Filled 2012-02-03 (×5): qty 1

## 2012-02-03 MED ORDER — HEPARIN SODIUM (PORCINE) 1000 UNIT/ML DIALYSIS: 20.0000 [IU]/kg | INTRAMUSCULAR | Status: DC | PRN

## 2012-02-03 MED ORDER — ALBUTEROL SULFATE (5 MG/ML) 0.5% IN NEBU
2.5000 mg | INHALATION_SOLUTION | Freq: Four times a day (QID) | RESPIRATORY_TRACT | Status: DC
  Administered 2012-02-03 – 2012-02-07 (×16): 2.5 mg via RESPIRATORY_TRACT
  Filled 2012-02-03 (×18): qty 0.5

## 2012-02-03 MED ORDER — SODIUM CHLORIDE 0.9 % IV SOLN
200.0000 ug/h | INTRAVENOUS | Status: AC
  Administered 2012-02-03: 200 ug/h via INTRAVENOUS
  Filled 2012-02-03: qty 50

## 2012-02-03 MED ORDER — FENTANYL 50 MCG/HR TD PT72
150.0000 ug | MEDICATED_PATCH | TRANSDERMAL | Status: DC
  Administered 2012-02-03: 150 ug via TRANSDERMAL
  Filled 2012-02-03: qty 2
  Filled 2012-02-03: qty 1

## 2012-02-03 MED ORDER — IPRATROPIUM BROMIDE 0.02 % IN SOLN
0.5000 mg | Freq: Four times a day (QID) | RESPIRATORY_TRACT | Status: DC
  Administered 2012-02-03 – 2012-02-07 (×16): 0.5 mg via RESPIRATORY_TRACT
  Filled 2012-02-03 (×17): qty 2.5

## 2012-02-03 MED ORDER — ALBUTEROL SULFATE (5 MG/ML) 0.5% IN NEBU: 2.5000 mg | INHALATION_SOLUTION | RESPIRATORY_TRACT | Status: DC | PRN

## 2012-02-03 MED ORDER — DEXMEDETOMIDINE HCL 100 MCG/ML IV SOLN
0.4000 ug/kg/h | INTRAVENOUS | Status: AC
  Administered 2012-02-03 – 2012-02-04 (×6): 1.2 ug/kg/h via INTRAVENOUS
  Filled 2012-02-03 (×8): qty 4

## 2012-02-03 NOTE — Progress Notes (Signed)
Name: [name] MRN: [id number] DOB: 1956/06/05    LOS: 8 Date of admit 01/26/2012  5:09 PM  PULMONARY / CRITICAL CARE MEDICINE  BRIEF  56 year old male with PMH of HTN, COPD, Hep C, polysubstance abuse, chronic recurrent pancreatitis, PUD and ESRD. He initially called EMS from home complaining of chest pain. At arrival EMS found him in PEA arrest. He was intubated in the field, advanced CPR was initiated and return to spontaneous circulation was obtained after 5 to 6 minutes. Initial EKG showed peaked T waves and K+ of 6.4. At arrival was unresponsive to painful stimuli. Patient reportedly had missed dialysis for the week PTA.   KEY SOCIAL HX  - Patient's sister, Casten Floren [phone]). Patient has brothers as well. Ms. Timothy  Most involved and admitted to patient problems with non compliance and substance abuse   EVENTS 6/25 increasing pressors, fluid bolus for low bp, hypoglycemic requiring D10 6/26 agitation, some bleeding from upper airway, diarrhea overnight--> flexiseal 6/28 Severe agitation requiring increased fent gtt overnight, Transfused 1 u on HD CT abd  - fracture rt 6-8 ribs, splenomegaly, LLL consolidation 6/28 precedex/ klonopin added with decreased fent gtt 7/01 Not weanable due to agitation on WUA  SUBJECTIVE/OVERNIGHT/INTERVAL HX RASS 0- +1 on fentanyl gtt  Vital Signs: Temp:  [98.7 F (37.1 C)-100.3 F (37.9 C)] 98.9 F (37.2 C) (07/01 1359) Pulse Rate:  [70-153] 75  (07/01 1445) Resp:  [15-35] 16  (07/01 1445) BP: (93-176)/(54-106) 93/56 mmHg (07/01 1445) SpO2:  [91 %-100 %] 97 % (07/01 1400) FiO2 (%):  [40 %] 40 % (07/01 1422)  Physical Examination: General:  Intubated, mechanically ventilated Neuro:  Arouses, follows commands intermittently, MAEs HEENT:  PERRL, pink conjunctivae, moist membranes. Neck:  Supple, no JVD   Cardiovascular:  RRR, no M/R/G Lungs: coarse breath sounds bilaterally, no wheezing.  Abdomen:  soft, mildly distended,  diminished bowel sounds but present. Musculoskeletal:  No pedal edema, cyanosis or clubbing. Skin:  No rash. Multiple skin superficial erosions, bruises and crusts.     ASSESSMENT AND PLAN:    Patient Active Problem List  Diagnosis  . SUBSTANCE ABUSE, MULTIPLE  . PEPTIC ULCER DISEASE  . Chronic recurrent pancreatitis  . End stage renal disease on dialysis  . Alcohol abuse  . COPD (chronic obstructive pulmonary disease)  . Neuropathy  . Protein malnutrition  . PEA (Pulseless electrical activity)  . Acute respiratory failure with hypoxia  . Hyperkalemia  . Bacteremia due to Pseudomonas  . Sepsis due to Streptococcus, group B  . C. difficile colitis  . Anemia  . Thrombocytopenia       PULMONARY  Lab 01/31/12 0951 01/30/12 0826 01/30/12 0450  PHART 7.276* 7.255* 7.356  PCO2ART 53.2* 66.3* 48.1*  PO2ART 87.0 70.0* 68.4*  HCO3 24.8* 29.1* 26.3*  O2SAT 95.0 89.0 93.1   Ventilator Settings: Vent Mode:  [-] PRVC FiO2 (%):  [40 %] 40 % Set Rate:  [16 bmp-24 bmp] 16 bmp Vt Set:  [500 mL-550 mL] 550 mL PEEP:  [5 cmH20] 5 cmH20 Pressure Support:  [5 cmH20] 5 cmH20 Plateau Pressure:  [17 cmH20-28 cmH20] 19 cmH20 CXR: increased bilateral AS dz c/w edema ETT:  01/26/2012 >>  A:   1) Respiratory failure secondary to cardiac arrest 2) History of COPD 3) Respiratory and metabolic acidosis - resolved 4) LLL Aspiration pna 5) Fracture rt 6-8 ribs with cpr   P:   Ct SBTs  -  - agitation needs better controlled &  neg balance prior to extubn, prior Cont albuterol/ipatropium nebs  CARDIOVASCULAR No results found for this basename: TROPONINI:5,LATICACIDVEN:5, O2SATVEN:5,PROBNP:5 in the last 168 hours ECG:  Peak T waves, Sinus rhythm at admit on 6/23 -> normalized 6/24 Lines: Left IJ CVL 6/24 >>   A:  1) PEA arrest. Unclear etiology, likely metabolic derrangements secondary to non compliance with dialysis and sepsis -  septic/ cardiogenic shock 2) History of  hypertension 3) Mild bump in troponin since admit probably secondary to sepsis  P:  - Off pressors - LHC Cards following  RENAL  Lab 02/03/12 0330 02/02/12 0500 02/01/12 0415 01/31/12 0515 01/30/12 0430 01/29/12 0424  NA 130* 134* 137 132* 134* --  K 3.5 3.1* -- -- -- --  CL 96 99 99 96 97 --  CO2 22 23 27 23 24  --  BUN 54* 39* 19 36* 26* --  CREATININE 5.00* 4.31* 3.28* 5.14* 4.16* --  CALCIUM 10.2 9.4 8.3* 8.2* 8.1* --  MG -- -- -- -- -- --  PHOS -- -- -- -- -- 7.8*   Intake/Output      06/30 0701 - 07/01 0700 07/01 0701 - 07/02 0700   I.V. (mL/kg) 2459.2 (29.5) 569.4 (6.8)   NG/GT 850 240   IV Piggyback 110    Total Intake(mL/kg) 3419.2 (41) 809.4 (9.7)   Stool 300    Total Output 300    Net +3119.2 +809.4         Foley:  6/23 >>   A:   1) ESRD on hemodialysis 2) Hyperkalemia, hyponatremia at admit on 01/26/2012. S/p emergent HD on 01/26/12    -  P:  HD per Renal   GASTROINTESTINAL  Lab 01/29/12 0424  AST --  ALT --  ALKPHOS --  BILITOT --  PROT --  ALBUMIN 1.6*    A:  1) History of PUD  P:   1) may need GI input - hb stable, unclear if occult blood in stool from upper airway source 2) Cont TFs - Nepro 3) Protonix IV q 12h  HEMATOLOGIC  Lab 02/03/12 0330 02/02/12 0500 02/01/12 0415 01/31/12 0515 01/30/12 1000 01/30/12 0430  HGB 8.8* 8.4* 7.5* 7.0* -- 7.9*  HCT 26.2* 24.7* 22.1* 20.4* -- 23.0*  PLT 72* 46* 40* 34* -- 46*  INR -- -- -- -- 1.18 --  APTT -- -- -- -- -- --   A:  Anemic of critical illness / Thrombocytopenia P:   - goal Hb 7 & above ok -upper airway bleeding - ? Trauma from intubation, watch HB - can transfuse if needed on next HD -guiaic POS - ? UGI B vs  Upper airway bleed - hold off GI consult -dc heparin, await HITTpanel, use SCds  INFECTIOUS  Lab 02/03/12 0330 02/02/12 0500 02/01/12 0415 01/31/12 0515 01/30/12 0430  WBC 8.0 5.9 6.9 8.5 13.0*  PROCALCITON 3.23 -- -- -- --   Cultures:  6/23 urine >> strep  agalactiae 6/23 resp >>strep agalactiae 6/23 bld >> pseudomonas, strep agalactiae 6/26 C diff pCR POS 6/27 blood X 2  >>   ABx Pip/ Tazo 6/23 >> 6/27 cipro 6/23 >>6/27 vanc 6/23 >> 6/27 Cefepime 6/27 >> PO vanc 6/27 >> Flagyl 6/27 >> A:   1) Pseudomonas sepsis, CT abdomen 6/28 does not reveal GI source 2) History of MRSA but MRSA Pcr negative on admit 01/26/2012 3) History of Hep C 4)  Group B Strep Agalactiae bacteremia 5) c. Diff colitis  P:  Abx per ID  ENDOCRINE  Lab 02/03/12 0837 02/03/12 0324 02/03/12 0007 02/02/12 2030 02/02/12 1540  GLUCAP 101* 109* 126* 116* 108*   A:   1) Hypoglycemia - resolved P:    D/C CBGs/SSI 7/01  NEUROLOGIC  A:  Encephalopathy - agitated delirium, h/o polysubstance abuse P:   Transition from fentanyl gtt to Duragesic patch Increase Klonopin and D/C versed gtt Cont Precedex    BEST PRACTICE / DISPOSITION - Level of Care:  ICU - Primary Service:  Critical care - Consultants:  Nephrology, ID - Code Status:  Full code - Diet:  TFs - DVT Px:  SCDs - GI Px:  Protonix - Skin Integrity:  No decubitus ulcers at admission - Social / Family: Updated sister Rishard Delange  is main health care provider 6/30. Patient has siblings   The patient is critically ill with multiple organ systems failure and requires high complexity decision making for assessment and support, frequent evaluation and titration of therapies, application of advanced monitoring technologies and extensive interpretation of multiple databases.   Critical Care Time devoted to patient care services described in this note is: 35 minutes    Billy Fischer, MD ; [facility] [phone].  After 5:30 PM or weekends, call [phone]

## 2012-02-03 NOTE — Progress Notes (Addendum)
Patient ID: [name], male   DOB: 04/18/1956, 56 y.o.   MRN: [id number]    [facility]    Date of Admission:  01/26/2012   Total days of antibiotics 9 for Pseudomonas        Day 5 cefepime        Day 5 metronidazole        Day 5 oral vancomycin Principal Problem:  *PEA (Pulseless electrical activity) Active Problems:  End stage renal disease on dialysis  Abdominal pain, generalized  Alcohol abuse  Syncope  Acute respiratory failure with hypoxia  Hyperkalemia  Bacteremia due to Pseudomonas  Sepsis due to Streptococcus, group B  C. difficile colitis      . albuterol  2.5 mg Nebulization Q6H  . antiseptic oral rinse  15 mL Mouth Rinse QID  . ceFEPime (MAXIPIME) IV  2 g Intravenous Q M,W,F-HD  . chlorhexidine  15 mL Mouth/Throat BID  . clonazePAM  1 mg Oral TID  . feeding supplement  30 mL Per Tube TID  . fentaNYL  150 mcg Transdermal Q72H  . ipratropium  0.5 mg Nebulization Q6H  . vancomycin  500 mg Oral Q6H   And  . metronidazole  500 mg Intravenous Q8H  . mupirocin cream  1 application Topical BID  . pantoprazole (PROTONIX) IV  40 mg Intravenous Q12H  . potassium chloride      . DISCONTD: clonazePAM  1 mg Oral BID  . DISCONTD: insulin aspart  0-3 Units Subcutaneous Q4H   Objective: Temp:  [98.7 F (37.1 C)-100.3 F (37.9 C)] 100.3 F (37.9 C) (07/01 0800) Pulse Rate:  [70-153] 80  (07/01 1100) Resp:  [15-35] 15  (07/01 1100) BP: (100-176)/(54-106) 100/54 mmHg (07/01 1100) SpO2:  [91 %-100 %] 97 % (07/01 1100) FiO2 (%):  [40 %] 40 % (07/01 1100)  General: He remains sedated on the ventilator. Moderate tan endotracheal tube secretions Skin: Scattered abrasions and bruises Lungs: Clear anteriorly Cor: Distant heart sounds with a regular S1 and S2 and no murmurs Abdomen: Soft, quiet bowel sounds  Lab Results Lab Results  Component Value Date   WBC 8.0 02/03/2012   HGB 8.8* 02/03/2012   HCT 26.2* 02/03/2012   MCV 96.3 02/03/2012   PLT  72* 02/03/2012    Lab Results  Component Value Date   CREATININE 5.00* 02/03/2012   BUN 54* 02/03/2012   NA 130* 02/03/2012   K 3.5 02/03/2012   CL 96 02/03/2012   CO2 22 02/03/2012    Lab Results  Component Value Date   ALT 27 01/26/2012   AST 42* 01/26/2012   ALKPHOS 102 01/26/2012   BILITOT 0.2* 01/26/2012      Microbiology: Recent Results (from the past 240 hour(s))  CULTURE, BLOOD (ROUTINE X 2)     Status: Normal   Collection Time   01/26/12  8:50 PM      Component Value Range Status Comment   Specimen Description BLOOD   Final    Special Requests     Final    Value: LEFT IJ BOTTLES DRAWN AEROBIC AND ANAEROBIC 10CC EACH   Culture  Setup Time [id number]   Final    Culture     Final    Value: GROUP B STREP(S.AGALACTIAE)ISOLATED     STAPHYLOCOCCUS SPECIES (COAGULASE NEGATIVE)     Note: THE SIGNIFICANCE OF ISOLATING THIS ORGANISM FROM A SINGLE SET OF BLOOD CULTURES WHEN MULTIPLE SETS ARE DRAWN IS UNCERTAIN. PLEASE NOTIFY THE MICROBIOLOGY  DEPARTMENT WITHIN ONE WEEK IF SPECIATION AND SENSITIVITIES ARE REQUIRED.     Note: Gram Stain Report Called to,Read Back By and Verified With: LESLIE WILSON @ 1444 01/27/12 BY KRAWS   Report Status 01/29/2012 FINAL   Final    Organism ID, Bacteria GROUP B STREP(S.AGALACTIAE)ISOLATED   Final   URINE CULTURE     Status: Normal   Collection Time   01/26/12  9:39 PM      Component Value Range Status Comment   Specimen Description URINE, CATHETERIZED   Final    Special Requests Normal   Final    Culture  Setup Time [id number]   Final    Colony Count >=100,000 COLONIES/ML   Final    Culture     Final    Value: GROUP B STREP(S.AGALACTIAE)ISOLATED     Note: TESTING AGAINST S. AGALACTIAE NOT ROUTINELY PERFORMED DUE TO PREDICTABILITY OF AMP/PEN/VAN SUSCEPTIBILITY.   Report Status 01/28/2012 FINAL   Final   CULTURE, BLOOD (ROUTINE X 2)     Status: Normal   Collection Time   01/26/12 10:06 PM      Component Value Range Status Comment   Specimen Description  BLOOD RIGHT RADIAL A-LINE DRAW   Final    Special Requests BOTTLES DRAWN AEROBIC AND ANAEROBIC [facility]   Final    Culture  Setup Time [id number]   Final    Culture     Final    Value: PSEUDOMONAS AERUGINOSA     Note: Gram Stain Report Called to,Read Back By and Verified With: ROSE COLLOM @ 2150 ON 01/27/12 BY GOLLD   Report Status 01/30/2012 FINAL   Final    Organism ID, Bacteria PSEUDOMONAS AERUGINOSA   Final   MRSA PCR SCREENING     Status: Normal   Collection Time   01/26/12 10:48 PM      Component Value Range Status Comment   MRSA by PCR NEGATIVE  NEGATIVE Final   CULTURE, RESPIRATORY     Status: Normal   Collection Time   01/29/12 12:06 AM      Component Value Range Status Comment   Specimen Description TRACHEAL ASPIRATE   Final    Special Requests NONE   Final    Gram Stain     Final    Value: NO WBC SEEN     NO SQUAMOUS EPITHELIAL CELLS SEEN     FEW GRAM POSITIVE COCCI IN PAIRS   Culture FEW PSEUDOMONAS AERUGINOSA   Final    Report Status 01/31/2012 FINAL   Final    Organism ID, Bacteria PSEUDOMONAS AERUGINOSA   Final   CLOSTRIDIUM DIFFICILE BY PCR     Status: Abnormal   Collection Time   01/30/12  4:00 AM      Component Value Range Status Comment   C difficile by pcr POSITIVE (*) NEGATIVE Final   WOUND CULTURE     Status: Normal   Collection Time   01/30/12 12:55 PM      Component Value Range Status Comment   Specimen Description WOUND RIGHT LEG   Final    Special Requests NONE   Final    Gram Stain     Final    Value: NO WBC SEEN     NO SQUAMOUS EPITHELIAL CELLS SEEN     NO ORGANISMS SEEN   Culture NO GROWTH 2 DAYS   Final    Report Status 02/01/2012 FINAL   Final   CULTURE, BLOOD (SINGLE)     Status: Normal (  Preliminary result)   Collection Time   01/30/12  9:35 PM      Component Value Range Status Comment   Specimen Description BLOOD CENTRAL LINE   Final    Special Requests BOTTLES DRAWN AEROBIC AND ANAEROBIC 10CC   Final    Culture  Setup Time 01/31/2012  03:48   Final    Culture     Final    Value:        BLOOD CULTURE RECEIVED NO GROWTH TO DATE CULTURE WILL BE HELD FOR 5 DAYS BEFORE ISSUING A FINAL NEGATIVE REPORT   Report Status PENDING   Incomplete     Studies/Results: Dg Chest Port 1 View  02/03/2012  *RADIOLOGY REPORT*  Clinical Data: Pneumonia  PORTABLE CHEST - 1 VIEW  Comparison: 02/02/2012  Findings: Endotracheal tube, NG tube, left internal jugular vein center venous catheter are stable.  Diffuse but predominately central basilar airspace disease and vascular congestion are worse. Haziness at the left base unchanged.  No pneumothorax.  IMPRESSION: Worsening diffuse airspace disease or pulmonary edema.  Left basilar opacity is not significantly changed, but keep in mind that airspace disease has become more homogenized and diffuse.  Original Report Authenticated By: Donavan Burnet, M.D.   Dg Chest Port 1 View  02/02/2012  *RADIOLOGY REPORT*  Clinical Data: Pneumonia  PORTABLE CHEST - 1 VIEW  Comparison:   the previous day's study  Findings: Endotracheal tube, nasogastric tube, and left IJ central line are stable in position.  Dense left lower lung consolidation / atelectasis with probable adjacent effusion.  Mild perihilar and bibasilar interstitial and patchy alveolar edema or infiltrates as before.  Heart size appears mildly enlarged.  IMPRESSION:  1.  Little change from previous day's portable exam.  Original Report Authenticated By: Osa Craver, M.D.    Assessment: His polymicrobial bacteremia do to Pseudomonas and group B strep may have been due to translocation of bacteria during his arrest. I will plan on 5 more days of cefepime for his Pseudomonas bacteremia and pneumonia. I will continue his therapy for C. difficile colitis for one more week after completing cefepime.  Plan: 1. Continue current antibiotics 2. I will followup July 3. Please call if needed sooner.  Cliffton Asters, MD [facility] for Infectious  D[facility] [phone] pager   [phone] cell 02/03/2012, 11:15 AM

## 2012-02-03 NOTE — Progress Notes (Signed)
Subjective:  On vent  Objective:    Vital signs in last 24 hours: Filed Vitals:   02/03/12 0800 02/03/12 0802 02/03/12 0830 02/03/12 0900  BP:  176/106 125/70 176/105  Pulse:  153 88 134  Temp: 100.3 F (37.9 C)     TempSrc: Oral     Resp:  35 24 26  Height:      Weight:      SpO2:  97% 98% 100%   Weight change:   Intake/Output Summary (Last 24 hours) at 02/03/12 1015 Last data filed at 02/03/12 0900  Gross per 24 hour  Intake   3140 ml  Output    300 ml  Net   2840 ml   Labs: Basic Metabolic Panel:  Lab 02/03/12 1308 02/02/12 0500 02/01/12 0415 01/31/12 0515 01/30/12 0430 01/29/12 0424 01/28/12 1200  NA 130* 134* 137 132* 134* 125* 129*  K 3.5 3.1* 3.1* 3.0* 3.5 5.4* 4.7  CL 96 99 99 96 97 90* 93*  CO2 22 23 27 23 24  18* 18*  GLUCOSE 114* 109* 146* 115* 113* 324* 70  BUN 54* 39* 19 36* 26* 65* 61*  CREATININE 5.00* 4.31* 3.28* 5.14* 4.16* 7.84* 7.69*  ALB -- -- -- -- -- -- --  CALCIUM 10.2 9.4 8.3* 8.2* 8.1* 7.5* 8.0*  PHOS -- -- -- -- -- 7.8* --   Liver Function Tests:  Lab 01/29/12 0424  AST --  ALT --  ALKPHOS --  BILITOT --  PROT --  ALBUMIN 1.6*   No results found for this basename: LIPASE:3,AMYLASE:3 in the last 168 hours No results found for this basename: AMMONIA:3 in the last 168 hours CBC:  Lab 02/03/12 0330 02/02/12 0500 02/01/12 0415 01/31/12 0515  WBC 8.0 5.9 6.9 8.5  NEUTROABS -- -- -- --  HGB 8.8* 8.4* 7.5* 7.0*  HCT 26.2* 24.7* 22.1* 20.4*  MCV 96.3 94.6 94.4 95.8  PLT 72* 46* 40* 34*   Cardiac Enzymes:  Lab 01/27/12 1200  CKTOTAL 1093*  CKMB 33.2*  CKMBINDEX --  TROPONINI 0.48*   CBG:  Lab 02/03/12 0837 02/03/12 0324 02/03/12 0007 02/02/12 2030 02/02/12 1540  GLUCAP 101* 109* 126* 116* 108*    Iron Studies: No results found for this basename: IRON:30,TIBC:30,SATURATION RATIOS:30,TRANSFERRIN:30,FERRITIN:30 in the last 168 hours  Physical Exam:  Blood pressure 176/105, pulse 134, temperature 100.3 F (37.9 C), temperature  source Oral, resp. rate 26, height 6' (1.829 m), weight 83.3 kg (183 lb 10.3 oz), SpO2 100.00%.  CVS:rrr  Resp:occ rhonchi  MVH:QIONGEXBM, +BS, soft  Ext:+1edema RLE, tr on left, Lt AVG +T/B  Outpatient Dialysis: [facility] LUA AVGG. 4 hr, EDW 69.5 kg. EPO 4600u. Zemplar 1 ug. Neprox 1. Heparin 2000u.  3.0K/2.25Ca bath. 400/800.  Assessment/Plan   1. SIRS/Pseudomonas PNA/bacteremia and Grp B strep UTI/bacteremia- per ID/primary, 14d abx 2. C Diff colitis- po vanc/IV flagyl 3. VDRF- worsening infiltrates bilat on CXR, up 10kg with anasarca and pulm edema. Daily HD until volume controlled.  4. ESRD TTS HD- HD today, extra. 5. Alcoholism 6. Anemia:/ACDz and ABLA 7. thrombocytopenia 8. s/p PEA Arrest 9. Malnutrition 1. HTN- was on fosinopril 10/d clonidine 0.3 bid at home. BP meds on hold here.  Vinson Moselle  MD [facility] [phone] pgr    [phone] cell 02/03/2012, 10:15 AM

## 2012-02-04 ENCOUNTER — Inpatient Hospital Stay (HOSPITAL_COMMUNITY): Payer: Medicare Other

## 2012-02-04 LAB — COMPREHENSIVE METABOLIC PANEL
AST: 19 U/L (ref 0–37)
CO2: 26 mEq/L (ref 19–32)
Chloride: 98 mEq/L (ref 96–112)
Creatinine, Ser: 3.78 mg/dL — ABNORMAL HIGH (ref 0.50–1.35)
GFR calc non Af Amer: 16 mL/min — ABNORMAL LOW (ref >90)
Total Bilirubin: 0.2 mg/dL — ABNORMAL LOW (ref 0.3–1.2)

## 2012-02-04 LAB — GLUCOSE, CAPILLARY
Glucose-Capillary: 110 mg/dL — ABNORMAL HIGH (ref 70–99)
Glucose-Capillary: 96 mg/dL (ref 70–99)
Glucose-Capillary: 94 mg/dL (ref 70–99)

## 2012-02-04 LAB — CBC
HCT: 26.1 % — ABNORMAL LOW (ref 39.0–52.0)
Hemoglobin: 8.6 g/dL — ABNORMAL LOW (ref 13.0–17.0)
MCV: 97 fL (ref 78.0–100.0)
Platelets: 98 10*3/uL — ABNORMAL LOW (ref 150–400)
RBC: 2.69 MIL/uL — ABNORMAL LOW (ref 4.22–5.81)
WBC: 10 10*3/uL (ref 4.0–10.5)

## 2012-02-04 MED ORDER — ONDANSETRON HCL 4 MG/2ML IJ SOLN
4.0000 mg | Freq: Four times a day (QID) | INTRAMUSCULAR | Status: DC | PRN
  Administered 2012-02-04 – 2012-02-07 (×3): 4 mg via INTRAVENOUS
  Filled 2012-02-04 (×3): qty 2

## 2012-02-04 MED ORDER — LORAZEPAM 2 MG/ML IJ SOLN
1.0000 mg | INTRAMUSCULAR | Status: DC
  Administered 2012-02-04 – 2012-02-05 (×3): 1 mg via INTRAVENOUS
  Filled 2012-02-04 (×3): qty 1

## 2012-02-04 MED ORDER — HEPARIN SODIUM (PORCINE) 1000 UNIT/ML DIALYSIS
2000.0000 [IU] | INTRAMUSCULAR | Status: DC | PRN
  Administered 2012-02-05: 2000 [IU] via INTRAVENOUS_CENTRAL

## 2012-02-04 MED ORDER — LORAZEPAM 2 MG/ML IJ SOLN
1.0000 mg | Freq: Once | INTRAMUSCULAR | Status: AC
  Administered 2012-02-04: 1 mg via INTRAVENOUS
  Filled 2012-02-04: qty 1

## 2012-02-04 NOTE — Procedures (Signed)
Extubation Procedure Note  Patient Details:   Name: QUANAH MAJKA DOB: [birth year] MRN: [id number]   Airway Documentation:  AIRWAYS 8 mm (Active)  Secured at (cm) 26 cm 02/01/2012  8:00 AM  Measured From Lips 02/02/2012  8:00 PM  Secured By [employer] 02/01/2012  8:00 AM    Evaluation  O2 sats: stable throughout Complications: No apparent complications Patient did tolerate procedure well. Bilateral Breath Sounds: Rhonchi Suctioning: Airway  Yes  Patient extubated per MD order. Placed on 4L [place], HR 125, RR 25, SpO2 93. No stridor noted. Patient has audible rhonchi. Patient tolerated well.  Laray Anger D 02/04/2012, 8:49 AM

## 2012-02-04 NOTE — Progress Notes (Signed)
Subjective:  Extubated, awake, responsive. Doesn't recall events. 4kg off with HD yesterday  Objective:    Vital signs in last 24 hours: Filed Vitals:   02/04/12 1315 02/04/12 1330 02/04/12 1345 02/04/12 1400  BP: 136/62 146/80 149/77 107/73  Pulse: 132 133 135 131  Temp:      TempSrc:      Resp: 23 22 24 26   Height:      Weight:      SpO2: 93% 95% 95% 94%   Weight change:   Intake/Output Summary (Last 24 hours) at 02/04/12 1418 Last data filed at 02/04/12 0800  Gross per 24 hour  Intake   1364 ml  Output   3850 ml  Net  -2486 ml   Labs: Basic Metabolic Panel:  Lab 02/04/12 1610 02/03/12 0330 02/02/12 0500 02/01/12 0415 01/31/12 0515 01/30/12 0430 01/29/12 0424  NA 132* 130* 134* 137 132* 134* 125*  K 3.7 3.5 3.1* 3.1* 3.0* 3.5 5.4*  CL 98 96 99 99 96 97 90*  CO2 26 22 23 27 23 24  18*  GLUCOSE 128* 114* 109* 146* 115* 113* 324*  BUN 39* 54* 39* 19 36* 26* 65*  CREATININE 3.78* 5.00* 4.31* 3.28* 5.14* 4.16* 7.84*  ALB -- -- -- -- -- -- --  CALCIUM 9.7 10.2 9.4 8.3* 8.2* 8.1* 7.5*  PHOS -- -- -- -- -- -- 7.8*   Liver Function Tests:  Lab 02/04/12 0434 01/29/12 0424  AST 19 --  ALT 12 --  ALKPHOS 135* --  BILITOT 0.2* --  PROT 6.3 --  ALBUMIN 1.6* 1.6*   No results found for this basename: LIPASE:3,AMYLASE:3 in the last 168 hours No results found for this basename: AMMONIA:3 in the last 168 hours CBC:  Lab 02/04/12 0434 02/03/12 0330 02/02/12 0500 02/01/12 0415  WBC 10.0 8.0 5.9 6.9  NEUTROABS -- -- -- --  HGB 8.6* 8.8* 8.4* 7.5*  HCT 26.1* 26.2* 24.7* 22.1*  MCV 97.0 96.3 94.6 94.4  PLT 98* 72* 46* 40*    Physical Exam:  Blood pressure 107/73, pulse 131, temperature 99.9 F (37.7 C), temperature source Oral, resp. rate 26, height 6' (1.829 m), weight 84.5 kg (186 lb 4.6 oz), SpO2 94.00%.  Gen: awake, extubated, alert, no distress, not fully oriented RUE:AVWUJWJ, no rub or gallop  Resp:occ rhonchi  XBJ:YNWGNFAOZ, +BS, soft  Ext- diffuse edema/anasarca  of all ext and scrotum, Lt AVG +T/B  Outpatient Dialysis: [facility] LUA AVGG. 4 hr, EDW 69.5 kg. EPO 4600u. Zemplar 1 ug. Neprox 1. Heparin 2000u.  3.0K/2.25Ca bath. 400/800.  Assessment/Plan   1. SIRS/Pseudomonas PNA/bacteremia and Grp B strep UTI/bacteremia- per ID/primary, 14d abx 2. C Diff colitis- po vanc/IV flagyl 3. Volume- up 10-15kg with anasarca and pulm edema. Daily HD until volume controlled.  4. Resp failure- off vent now, better 5. ESRD TTS HD- HD today and again tomorrow. 6. Alcoholism 7. Anemia:/ACDz and ABLA 8. thrombocytopenia 9. s/p PEA Arrest 10. Malnutrition 11. HTN- was on fosinopril 10/d clonidine 0.3 bid at home. BP meds on hold here.  Vinson Moselle  MD [employer] [phone] pgr    [phone] cell 02/04/2012, 2:18 PM

## 2012-02-04 NOTE — Progress Notes (Signed)
Chaplain responded to a patient request for a visit. Chaplain visited with the patient's ICU and dialysis nurses and the patient. Chaplain experienced the patient as anxious and shaky. The patient stated that he is feeling scared of dying. He stated that this fear is related to the pain he feels in his chest. The patient also stated that he is afraid of being alone in the room for fear that he will die. Chaplain reassured patient that he is monitored by care providers even when they are not in the room with him. Chaplain observed the patient as suffering from some memory loss as he repeated the same medical questions in spite of having received answers from medical staff in the room. Chaplain also experienced the patient's anxiety and fear as connected to his memory loss. Chaplain listened and prayed with the patient per request of the patient. Chaplain also brought the patient a prayer blanket as a reminder that he is not alone in his illness. Patient would likely benefit from a social work consult. Chaplain plans to do a follow up visit with the patient tomorrow.

## 2012-02-04 NOTE — Progress Notes (Signed)
Name: [name] MRN: [id number] DOB: [birth year]    LOS: 9 Date of admit 01/26/2012  5:09 PM  PULMONARY / CRITICAL CARE MEDICINE  BRIEF  56 year old male with PMH of HTN, COPD, Hep C, polysubstance abuse, chronic recurrent pancreatitis, PUD and ESRD. He initially called EMS from home complaining of chest pain. At arrival EMS found him in PEA arrest. He was intubated in the field, advanced CPR was initiated and return to spontaneous circulation was obtained after 5 to 6 minutes. Initial EKG showed peaked T waves and K+ of 6.4. At arrival was unresponsive to painful stimuli. Patient reportedly had missed dialysis for the week PTA.   KEY SOCIAL HX  - Patient's sister, [name] [phone]). Patient has brothers as well. Ms. Faria  Most involved and admitted to patient problems with non compliance and substance abuse   EVENTS 6/25 increasing pressors, fluid bolus for low bp, hypoglycemic requiring D10 6/26 agitation, some bleeding from upper airway, diarrhea overnight--> flexiseal 6/28 Severe agitation requiring increased fent gtt overnight, Transfused 1 u on HD CT abd  - fracture rt 6-8 ribs, splenomegaly, LLL consolidation 6/28 precedex/ klonopin added with decreased fent gtt 7/01 Not weanable due to agitation on WUA  SUBJECTIVE/OVERNIGHT/INTERVAL HX RASS 0- +1 on precedex drip 1.36mcg,  Much more calm at this time  Vital Signs: Temp:  [98 F (36.7 C)-100.3 F (37.9 C)] 98.8 F (37.1 C) (07/02 0400) Pulse Rate:  [64-153] 64  (07/02 0700) Resp:  [14-35] 16  (07/02 0700) BP: (81-176)/(49-106) 114/65 mmHg (07/02 0700) SpO2:  [91 %-100 %] 100 % (07/02 0700) FiO2 (%):  [40 %] 40 % (07/02 0700) Weight:  [82.8 kg (182 lb 8.7 oz)-84.5 kg (186 lb 4.6 oz)] 84.5 kg (186 lb 4.6 oz) (07/02 0500)  Physical Examination: General:  Intubated, mechanically ventilated Neuro:  Arouses, follows commands intermittently, MAEs, not agitated HEENT:  PERRL, pink conjunctivae, moist  membranes. Neck:  Supple, no JVD   Cardiovascular:  RRR, no M/R/G Lungs: better BS, less rhonchi Abdomen:  soft, mildly distended, diminished bowel sounds but present. Musculoskeletal:  No pedal edema, cyanosis or clubbing. Skin:  No rash. Multiple skin superficial erosions, bruises and crusts.     ASSESSMENT AND PLAN:    Patient Active Problem List  Diagnosis  . SUBSTANCE ABUSE, MULTIPLE  . PEPTIC ULCER DISEASE  . Chronic recurrent pancreatitis  . End stage renal disease on dialysis  . Alcohol abuse  . COPD (chronic obstructive pulmonary disease)  . Neuropathy  . Protein malnutrition  . PEA (Pulseless electrical activity)  . Acute respiratory failure with hypoxia  . Hyperkalemia  . Bacteremia due to Pseudomonas  . Sepsis due to Streptococcus, group B  . C. difficile colitis  . Anemia  . Thrombocytopenia       PULMONARY  Lab 01/31/12 0951 01/30/12 0826 01/30/12 0450  PHART 7.276* 7.255* 7.356  PCO2ART 53.2* 66.3* 48.1*  PO2ART 87.0 70.0* 68.4*  HCO3 24.8* 29.1* 26.3*  O2SAT 95.0 89.0 93.1   Ventilator Settings: Vent Mode:  [-] PRVC FiO2 (%):  [40 %] 40 % Set Rate:  [16 bmp-24 bmp] 16 bmp Vt Set:  [550 mL] 550 mL PEEP:  [5 cmH20] 5 cmH20 Pressure Support:  [5 cmH20] 5 cmH20 Plateau Pressure:  [12 cmH20-28 cmH20] 14 cmH20 CXR: increased bilateral AS dz c/w edema ETT:  02/04/12 >> bilateral pulm edema.  ETT  A:   1) Respiratory failure secondary to cardiac arrest 2) History of COPD 3) Respiratory  and metabolic acidosis - resolved 4) LLL Aspiration pna 5) Fracture rt 6-8 ribs with cpr   P:   Cont SBT daily.  Reduce precedex and poss extubation today Cont albuterol/ipatropium nebs  CARDIOVASCULAR  Lab 02/04/12 0433  TROPONINI --  LATICACIDVEN --  PROBNP 30359.0*   ECG:  Peak T waves, Sinus rhythm at admit on 6/23 -> normalized 6/24 Lines: Left IJ CVL 6/24 >>   A:  1) PEA arrest. Due to  metabolic derrangements secondary to non compliance with  dialysis and sepsis -  septic/ cardiogenic shock 2) History of hypertension 3) Mild bump in troponin since admit probably secondary to sepsis  P:  - Off pressors - LHC Cards following  RENAL  Lab 02/04/12 0434 02/03/12 0330 02/02/12 0500 02/01/12 0415 01/31/12 0515 01/29/12 0424  NA 132* 130* 134* 137 132* --  K 3.7 3.5 -- -- -- --  CL 98 96 99 99 96 --  CO2 26 22 23 27 23  --  BUN 39* 54* 39* 19 36* --  CREATININE 3.78* 5.00* 4.31* 3.28* 5.14* --  CALCIUM 9.7 10.2 9.4 8.3* 8.2* --  MG -- -- -- -- -- --  PHOS -- -- -- -- -- 7.8*   Intake/Output      07/01 0701 - 07/02 0700 07/02 0701 - 07/03 0700   I.V. (mL/kg) 1200.8 (14.2)    NG/GT 920    IV Piggyback 100    Total Intake(mL/kg) 2220.8 (26.3)    Other 3600    Stool 250    Total Output 3850    Net -1629.2          Foley:  6/23 >>   A:   1) ESRD on hemodialysis 2) Hyperkalemia, hyponatremia at admit on 01/26/2012. S/p emergent HD on 01/26/12    -  P:  HD per Renal on 7/1  ?repeat again today. Renal numbers are better    GASTROINTESTINAL  Lab 02/04/12 0434 01/29/12 0424  AST 19 --  ALT 12 --  ALKPHOS 135* --  BILITOT 0.2* --  PROT 6.3 --  ALBUMIN 1.6* 1.6*    A:  1) History of PUD  P:   1) may need GI input - hb stable, unclear if occult blood in stool from upper airway source 2) Cont TFs - Nepro 3) Protonix IV q 12h  HEMATOLOGIC  Lab 02/04/12 0434 02/03/12 0330 02/02/12 0500 02/01/12 0415 01/31/12 0515 01/30/12 1000  HGB 8.6* 8.8* 8.4* 7.5* 7.0* --  HCT 26.1* 26.2* 24.7* 22.1* 20.4* --  PLT 98* 72* 46* 40* 34* --  INR -- -- -- -- -- 1.18  APTT -- -- -- -- -- --   A:  Anemic of critical illness / Thrombocytopenia P:   - goal Hb 7 & above ok -upper airway bleeding - ? Trauma from intubation, watch HB - can transfuse if needed on next HD -guaic  POS - ? UGI B vs  Upper airway bleed - suspect d/t C Diff colitis +/- gastritis -dc heparin, await HITTpanel, use SCds  INFECTIOUS  Lab 02/04/12 0434  02/03/12 0330 02/02/12 0500 02/01/12 0415 01/31/12 0515  WBC 10.0 8.0 5.9 6.9 8.5  PROCALCITON 3.16 3.23 -- -- --   Cultures:  6/23 urine >> strep agalactiae 6/23 resp >>strep agalactiae 6/23 bld >> pseudomonas, strep agalactiae 6/26 C diff pCR POS 6/27 blood X 2  >>   ABx Pip/ Tazo 6/23 >> 6/27 cipro 6/23 >>6/27 vanc 6/23 >> 6/27 Cefepime 6/27 >> PO  vanc 6/27 >> Flagyl 6/27 >> A:   1) Pseudomonas sepsis, CT abdomen 6/28 does not reveal GI source 2) History of MRSA but MRSA Pcr negative on admit 01/26/2012 3) History of Hep C 4)  Group B Strep Agalactiae bacteremia 5) c. Diff colitis  P:  Abx per ID   ENDOCRINE  Lab 02/03/12 2330 02/03/12 1939 02/03/12 1553 02/03/12 0837 02/03/12 0324  GLUCAP 114* 120* 87 101* 109*   A:   1) Hypoglycemia - resolved P:    D/C CBGs/SSI 7/01  NEUROLOGIC  A:  Encephalopathy - agitated delirium, h/o polysubstance abuse P:   Cont  Duragesic patch Increase Klonopin  Cont Precedex, reduce dose and push SBT    BEST PRACTICE / DISPOSITION - Level of Care:  ICU - Primary Service:  Critical care - Consultants:  Nephrology, ID - Code Status:  Full code - Diet:  TFs - DVT Px:  SCDs - GI Px:  Protonix - Skin Integrity:  No decubitus ulcers at admission - Social / Family: Updated sister [name]  is main health care provider 6/30. Patient has siblings   The patient is critically ill with multiple organ systems failure and requires high complexity decision making for assessment and support, frequent evaluation and titration of therapies, application of advanced monitoring technologies and extensive interpretation of multiple databases.   Critical Care Time devoted to patient care services described in this note is: 35 minutes   Shan Levans  Beeper  [phone]  Cell  [phone]  If no response or cell goes to voicemail, call beeper [phone]
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
## 2012-02-05 ENCOUNTER — Inpatient Hospital Stay (HOSPITAL_COMMUNITY): Payer: Medicare Other

## 2012-02-05 DIAGNOSIS — R7881 Bacteremia: Secondary | ICD-10-CM

## 2012-02-05 LAB — CBC
MCHC: 32.5 g/dL (ref 30.0–36.0)
RDW: 16.1 % — ABNORMAL HIGH (ref 11.5–15.5)

## 2012-02-05 LAB — BASIC METABOLIC PANEL
BUN: 23 mg/dL (ref 6–23)
Creatinine, Ser: 3.01 mg/dL — ABNORMAL HIGH (ref 0.50–1.35)
GFR calc Af Amer: 25 mL/min — ABNORMAL LOW (ref >90)
GFR calc non Af Amer: 22 mL/min — ABNORMAL LOW (ref >90)

## 2012-02-05 MED ORDER — CLONAZEPAM 0.5 MG PO TABS
ORAL_TABLET | ORAL | Status: AC
  Filled 2012-02-05: qty 2

## 2012-02-05 MED ORDER — BIOTENE DRY MOUTH MT LIQD
15.0000 mL | Freq: Two times a day (BID) | OROMUCOSAL | Status: DC
  Administered 2012-02-05 – 2012-02-12 (×13): 15 mL via OROMUCOSAL

## 2012-02-05 MED ORDER — PENTAFLUOROPROP-TETRAFLUOROETH EX AERO
INHALATION_SPRAY | CUTANEOUS | Status: AC
  Administered 2012-02-05: 1
  Filled 2012-02-05: qty 103.5

## 2012-02-05 MED ORDER — HEPARIN SODIUM (PORCINE) 1000 UNIT/ML DIALYSIS
2000.0000 [IU] | INTRAMUSCULAR | Status: DC | PRN
  Administered 2012-02-06: 2000 [IU] via INTRAVENOUS_CENTRAL

## 2012-02-05 MED ORDER — LORAZEPAM 1 MG PO TABS
2.0000 mg | ORAL_TABLET | Freq: Four times a day (QID) | ORAL | Status: DC
  Administered 2012-02-05 – 2012-02-07 (×8): 2 mg via ORAL
  Filled 2012-02-05 (×7): qty 2

## 2012-02-05 MED ORDER — FAMOTIDINE 20 MG PO TABS
20.0000 mg | ORAL_TABLET | Freq: Every day | ORAL | Status: DC
  Administered 2012-02-05 – 2012-02-14 (×8): 20 mg via ORAL
  Filled 2012-02-05 (×11): qty 1

## 2012-02-05 MED ORDER — CLONIDINE HCL 0.3 MG/24HR TD PTWK
0.3000 mg | MEDICATED_PATCH | TRANSDERMAL | Status: DC
  Administered 2012-02-05 – 2012-02-12 (×2): 0.3 mg via TRANSDERMAL
  Filled 2012-02-05 (×2): qty 1

## 2012-02-05 MED ORDER — LISINOPRIL 10 MG PO TABS
10.0000 mg | ORAL_TABLET | Freq: Every day | ORAL | Status: DC
  Administered 2012-02-05 – 2012-02-13 (×7): 10 mg via ORAL
  Filled 2012-02-05 (×10): qty 1

## 2012-02-05 MED ORDER — CEFEPIME HCL 1 G IJ SOLR
1.0000 g | INTRAMUSCULAR | Status: DC
  Administered 2012-02-05 – 2012-02-07 (×3): 1 g via INTRAVENOUS
  Filled 2012-02-05 (×5): qty 1

## 2012-02-05 NOTE — Progress Notes (Signed)
[name] 1956/01/22 LOS: 10  56 year old male with PMH relevant for HTN, COPD, Hep C, polysubstance abuse, chronic recurrent pancreatitis, PUD and ESRD on hemodialysis.   Summary of events while in ICU: The patient did have PEA arrest on 6/23 and was found down by EMS, he was admitted to the ICU after resuscitation and hypothermia protocol. 6/26 agitation, some bleeding from upper airway, diarrhea overnight--> flexiseal  6/28 Severe agitation requiring increased fent gtt overnight, Transfused 1 u on HD  CT abd - fracture rt 6-8 ribs, splenomegaly, LLL consolidation  7/2 Extubated and doing well with IV ativan 7/3 Transferred to 2900 and talking with no recollection of ICU time. Eating, still with diarrhea out flexiseal. Changed from IV metronidazole to oral vancomycin.   Subjective: The patient is awake and sitting up in bed. He states that he tried to eat some food earlier and was sick to his stomach. He is still having some SOB with cough and some tenderness in his chest. He is not having belly pain but still has flexiseal in and having drainage from that. He does not remember most of the ICU events but states that he is going to have his pastor help him "get straight". When confronted with the fact that he could have died and still could in the future. Advised strongly that he cease alcohol and come to dialysis regularly if he does want to continue living his life. He seemed to be aware of the seriousness of the hospital stay.   Objective: Vital signs in last 24 hours: Filed Vitals:   02/05/12 1700 02/05/12 1730 02/05/12 1736 02/05/12 2022  BP: 150/86 152/84 139/88   Pulse: 117 118 118   Temp:   99.1 F (37.3 C)   TempSrc:   Oral   Resp: 20 19 20    Height:      Weight:   167 lb 15.9 oz (76.2 kg)   SpO2:   95% 91%   Weight change: -6 lb 6.3 oz (-2.9 kg)  Intake/Output Summary (Last 24 hours) at 02/05/12 2139 Last data filed at 02/05/12 1736  Gross per 24 hour  Intake       0 ml  Output   4000 ml  Net  -4000 ml   General: resting in bed, pleasant HEENT: PERRL, several abrasions on his head Cardiac: S1 S2 heard Pulm: air flow heard bilaterally, some crackles appreciated, diffuse tenderness on right chest wall Abd: soft, nontender, nondistended, BS present Ext: warm and well perfused, no pedal edema Neuro: alert and oriented X3, cranial nerves II-XII grossly intact, no recollection of most of his hospital stay  Lab Results: Basic Metabolic Panel:  Lab 02/05/12 4782 02/04/12 0434  NA 138 132*  K 4.1 3.7  CL 99 98  CO2 28 26  GLUCOSE 96 128*  BUN 23 39*  CREATININE 3.01* 3.78*  CALCIUM 9.5 9.7  MG -- --  PHOS -- --   Liver Function Tests:  Lab 02/04/12 0434  AST 19  ALT 12  ALKPHOS 135*  BILITOT 0.2*  PROT 6.3  ALBUMIN 1.6*   CBC:  Lab 02/05/12 0406 02/04/12 0434  WBC 10.6* 10.0  NEUTROABS -- --  HGB 8.6* 8.6*  HCT 26.5* 26.1*  MCV 98.9 97.0  PLT 138* 98*   BNP:  Lab 02/04/12 0433  PROBNP 30359.0*   CBG:  Lab 02/05/12 0821 02/04/12 2320 02/04/12 1652 02/04/12 0801 02/03/12 2330 02/03/12 1939  GLUCAP 101* 94 96 110* 114* 120*  Coagulation:  Lab 01/30/12 1000  LABPROT 15.3*  INR 1.18    Micro Results: Recent Results (from the past 240 hour(s))  CULTURE, BLOOD (ROUTINE X 2)     Status: Normal   Collection Time   01/26/12 10:06 PM      Component Value Range Status Comment   Specimen Description BLOOD RIGHT RADIAL A-LINE DRAW   Final    Special Requests BOTTLES DRAWN AEROBIC AND ANAEROBIC [facility]   Final    Culture  Setup Time [id number]   Final    Culture     Final    Value: PSEUDOMONAS AERUGINOSA     Note: Gram Stain Report Called to,Read Back By and Verified With: ROSE COLLOM @ 2150 ON 01/27/12 BY GOLLD   Report Status 01/30/2012 FINAL   Final    Organism ID, Bacteria PSEUDOMONAS AERUGINOSA   Final   MRSA PCR SCREENING     Status: Normal   Collection Time   01/26/12 10:48 PM      Component Value Range Status  Comment   MRSA by PCR NEGATIVE  NEGATIVE Final   CULTURE, RESPIRATORY     Status: Normal   Collection Time   01/29/12 12:06 AM      Component Value Range Status Comment   Specimen Description TRACHEAL ASPIRATE   Final    Special Requests NONE   Final    Gram Stain     Final    Value: NO WBC SEEN     NO SQUAMOUS EPITHELIAL CELLS SEEN     FEW GRAM POSITIVE COCCI IN PAIRS   Culture FEW PSEUDOMONAS AERUGINOSA   Final    Report Status 01/31/2012 FINAL   Final    Organism ID, Bacteria PSEUDOMONAS AERUGINOSA   Final   CLOSTRIDIUM DIFFICILE BY PCR     Status: Abnormal   Collection Time   01/30/12  4:00 AM      Component Value Range Status Comment   C difficile by pcr POSITIVE (*) NEGATIVE Final   WOUND CULTURE     Status: Normal   Collection Time   01/30/12 12:55 PM      Component Value Range Status Comment   Specimen Description WOUND RIGHT LEG   Final    Special Requests NONE   Final    Gram Stain     Final    Value: NO WBC SEEN     NO SQUAMOUS EPITHELIAL CELLS SEEN     NO ORGANISMS SEEN   Culture NO GROWTH 2 DAYS   Final    Report Status 02/01/2012 FINAL   Final   CULTURE, BLOOD (SINGLE)     Status: Normal (Preliminary result)   Collection Time   01/30/12  9:35 PM      Component Value Range Status Comment   Specimen Description BLOOD CENTRAL LINE   Final    Special Requests BOTTLES DRAWN AEROBIC AND ANAEROBIC 10CC   Final    Culture  Setup Time 01/31/2012 03:48   Final    Culture     Final    Value:        BLOOD CULTURE RECEIVED NO GROWTH TO DATE CULTURE WILL BE HELD FOR 5 DAYS BEFORE ISSUING A FINAL NEGATIVE REPORT   Report Status PENDING   Incomplete    Studies/Results: Dg Chest Port 1 View  02/05/2012  *RADIOLOGY REPORT*  Clinical Data: Pulmonary edema  PORTABLE CHEST - 1 VIEW  Comparison: Chest radiograph 02/04/2012  Findings: Interval extubation and removal of  NG tube.  Left venous line remains.  Stable mildly enlarged heart silhouette.  There is dense bibasilar atelectasis.   There is a stable air space disease in left lower lobe.  Increased atelectasis at the right lung base. No pneumothorax.  IMPRESSION:  1.  New right basilar atelectasis following extubation. 2.  Stable dense left lower lobe atelectasis. 3.  Left basilar air space disease unchanged.  Original Report Authenticated By: Genevive Bi, M.D.   Dg Chest Port 1 View  02/04/2012  *RADIOLOGY REPORT*  Clinical Data: Assess endotracheal tube.  Respiratory failure.  PORTABLE CHEST - 1 VIEW  Comparison: 02/03/2012.  Findings: Endotracheal tube position new high at 82 mm from the carina.  This is at the level of the clavicles.  Enteric tube remains present.  Left IJ central line terminates in the upper SVC. Cardiomegaly, bilateral basilar atelectasis and basilar airspace disease remains present.  Small bilateral pleural effusions also present.  IMPRESSION:  1.  Little interval change in pulmonary aeration and support apparatus. 2.  Endotracheal tube tip 82 mm from the carina.  This could be advanced for more secure positioning.  Original Report Authenticated By: Andreas Newport, M.D.   Medications: I have reviewed the patient's current medications. Scheduled Meds:   . albuterol  2.5 mg Nebulization Q6H  . antiseptic oral rinse  15 mL Mouth Rinse BID  . ceFEPime (MAXIPIME) IV  1 g Intravenous Q24H  . clonazePAM      . clonazePAM  1 mg Oral TID  . cloNIDine  0.3 mg Transdermal Weekly  . famotidine  20 mg Oral Daily  . fentaNYL  150 mcg Transdermal Q72H  . ipratropium  0.5 mg Nebulization Q6H  . lisinopril  10 mg Oral Daily  . LORazepam  2 mg Oral Q6H  . mupirocin cream  1 application Topical BID  . pentafluoroprop-tetrafluoroeth      . vancomycin  500 mg Oral Q6H  . DISCONTD: antiseptic oral rinse  15 mL Mouth Rinse QID  . DISCONTD: ceFEPime (MAXIPIME) IV  2 g Intravenous Q M,W,F-HD  . DISCONTD: chlorhexidine  15 mL Mouth/Throat BID  . DISCONTD: LORazepam  1 mg Intravenous Q4H  . DISCONTD: metronidazole  500  mg Intravenous Q8H  . DISCONTD: pantoprazole (PROTONIX) IV  40 mg Intravenous Q12H   Continuous Infusions:  PRN Meds:.sodium chloride, sodium chloride, sodium chloride, acetaminophen, albuterol, heparin, heparin, heparin, heparin, heparin, lidocaine, lidocaine-prilocaine, ondansetron (ZOFRAN) IV, pentafluoroprop-tetrafluoroeth, DISCONTD: fentaNYL Assessment/Plan:  PEA (Pulseless electrical activity) - Found down at home by EMS and s/p resuscitation and hypothermia protocol. Likely induced by hyperkalemia from missing dialysis sessions.   End stage renal disease on dialysis - Normally MWF but off schedule. Last dialysis 7/3 and per renal will get HD tomorrow as well.   Alcohol abuse - Clonazepam and lorazepam scheduled and does seem to be stable and not actively withdrawing at this time.    Acute respiratory failure with hypoxia - Resolved and off vent succesfully. Will monitor closely.   Hyperkalemia - Due to missing dialysis and is getting regular dialysis now and resolved.   Encephalopathy - Now resolving. Will continue to wean off ativan and clonazepam as able.     Bacteremia due to Pseudomonas - Blood culture drawn 6/23 did grow pseudomonas and this was in tracheal aspirate as well. He did also grow some group B strep from his hemodialysis access and urine. Repeat blood culture on 6/27 was NTD. Currently on Day 11 of therapy. He does need an  additional 3 days of cefepime therapy to complete 14 day treatment for pseudomonas pneumonia and polymicrobial bacteremia.    Sepsis due to Streptococcus, group B - See above.    C. difficile colitis - Oral vancomycin and will need additional 10 days of treatment. Per ID if he is discharged sooner and is unable to afford this can be switched to oral metronidazole. Still with flexiseal draining dark brown liquidy stool. C Dif positive on 6/27.  Anemia - acute blood loss anemia on top of chronic disease anemia. Has received a unit of blood with  dialysis during this stay. No current blood loss noted. CBCs have been stable the last several days. Following BID but may be able to switch to daily.   Thrombocytopenia - chronic from end stage organ dysfunction, likely acutely worsened due to sepsis and bacteremia. Now have normalized to his baseline around 140 today.   DVT ppx - SCDs  Disposition - The patient will be taken over by the internal medicine teaching service starting 02/06/12. We have reviewed his chart and examined him in person.     LOS: 10 days   Genella Mech 02/05/2012, 9:39 PM

## 2012-02-05 NOTE — Progress Notes (Signed)
Chaplain followed up with patient. Chaplain and patient revisited the patient's fear of dying. Chaplain also attempted to explore with patient what makes life meaningful for him. Patient identified surviving his current health crisis and his daughter as meaning in his life at this time. Patient stated that he continues to have pain in his chest. He also stated that he has a great deal of pain in his feet. Chaplain plans to follow up with patient tomorrow.

## 2012-02-05 NOTE — Progress Notes (Addendum)
Name: [name] MRN: [id number] DOB: 1955/12/09    LOS: 10 Date of admit 01/26/2012  5:09 PM  PULMONARY / CRITICAL CARE MEDICINE  BRIEF  56 year old male with PMH of HTN, COPD, Hep C, polysubstance abuse, chronic recurrent pancreatitis, PUD and ESRD. He initially called EMS from home complaining of chest pain. At arrival EMS found him in PEA arrest. He was intubated in the field, advanced CPR was initiated and return to spontaneous circulation was obtained after 5 to 6 minutes. Initial EKG showed peaked T waves and K+ of 6.4. At arrival was unresponsive to painful stimuli. Patient reportedly had missed dialysis for the week PTA.   KEY SOCIAL HX  - Patient's sister, Ghazi Rumpf [phone]). Patient has brothers as well. Ms. Larue  Most involved and admitted to patient problems with non compliance and substance abuse   EVENTS 6/25 increasing pressors, fluid bolus for low bp, hypoglycemic requiring D10 6/26 agitation, some bleeding from upper airway, diarrhea overnight--> flexiseal 6/28 Severe agitation requiring increased fent gtt overnight, Transfused 1 u on HD CT abd  - fracture rt 6-8 ribs, splenomegaly, LLL consolidation 6/28 precedex/ klonopin added with decreased fent gtt 7/01 Not weanable due to agitation on WUA  SUBJECTIVE/OVERNIGHT/INTERVAL HX tol extubation well but mild agitation overnight, better with IV ativan  Vital Signs: Temp:  [97.8 F (36.6 C)-99.9 F (37.7 C)] 99.2 F (37.3 C) (07/03 0400) Pulse Rate:  [113-135] 117  (07/03 0600) Resp:  [16-27] 20  (07/03 0600) BP: (107-207)/(60-102) 158/89 mmHg (07/03 0600) SpO2:  [87 %-100 %] 93 % (07/03 0600) FiO2 (%):  [40 %] 40 % (07/02 0805) Weight:  [78.9 kg (173 lb 15.1 oz)-79.9 kg (176 lb 2.4 oz)] 78.9 kg (173 lb 15.1 oz) (07/03 0500)  Physical Examination: General: no resp distress Neuro: oriented to place and time.  Mild tremors HEENT:  PERRL, pink conjunctivae, moist membranes. Neck:  Supple, no JVD     Cardiovascular:  RRR, no M/R/G Lungs: better BS, less rhonchi Abdomen:  soft, mildly distended, diminished bowel sounds but present. Musculoskeletal:  No pedal edema, cyanosis or clubbing. Skin:  No rash. Multiple skin superficial erosions, bruises and crusts.     ASSESSMENT AND PLAN:    Patient Active Problem List  Diagnosis  . SUBSTANCE ABUSE, MULTIPLE  . PEPTIC ULCER DISEASE  . Chronic recurrent pancreatitis  . End stage renal disease on dialysis  . Alcohol abuse  . COPD (chronic obstructive pulmonary disease)  . Neuropathy  . Protein malnutrition  . PEA (Pulseless electrical activity)  . Acute respiratory failure with hypoxia  . Hyperkalemia  . Bacteremia due to Pseudomonas  . Sepsis due to Streptococcus, group B  . C. difficile colitis  . Anemia  . Thrombocytopenia       PULMONARY  Lab 01/31/12 0951 01/30/12 0826 01/30/12 0450  PHART 7.276* 7.255* 7.356  PCO2ART 53.2* 66.3* 48.1*  PO2ART 87.0 70.0* 68.4*  HCO3 24.8* 29.1* 26.3*  O2SAT 95.0 89.0 93.1    CXR: improved bilateral airspace disease ETT: 7/1>>>7/3  A:   1) Respiratory failure secondary to cardiac arrest>>>resolved 2) History of COPD 3) Respiratory and metabolic acidosis - resolved 4) LLL Aspiration pna d/t pseudomonas, improved  5) Fracture rt 6-8 ribs with cpr   P:   Titrate oxygen tfr to SDU  CARDIOVASCULAR  Lab 02/04/12 0433  TROPONINI --  LATICACIDVEN --  PROBNP 30359.0*   ECG:  Peak T waves, Sinus rhythm at admit on 6/23 -> normalized 6/24  Lines: Left IJ CVL 6/24 >> 7/3   A:  1) PEA arrest. Due to  metabolic derrangements secondary to non compliance with dialysis and sepsis -  septic/ cardiogenic shock>>>resolved  2) History of hypertension>>>BP rising  3) Mild bump in troponin since admit probably secondary to sepsis  P:  - Off pressors - LHC Cards following -resume clonidene and lisinopril for BP  RENAL  Lab 02/05/12 0406 02/04/12 0434 02/03/12 0330 02/02/12 0500  02/01/12 0415  NA 138 132* 130* 134* 137  K 4.1 3.7 -- -- --  CL 99 98 96 99 99  CO2 28 26 22 23 27   BUN 23 39* 54* 39* 19  CREATININE 3.01* 3.78* 5.00* 4.31* 3.28*  CALCIUM 9.5 9.7 10.2 9.4 8.3*  MG -- -- -- -- --  PHOS -- -- -- -- --   Intake/Output      07/02 0701 - 07/03 0700 07/03 0701 - 07/04 0700   I.V. (mL/kg) 12.6 (0.2)    NG/GT     IV Piggyback     Total Intake(mL/kg) 12.6 (0.2)    Other 4381    Stool 700    Total Output 5081    Net -5068.4          Foley:  6/23 >> out  A:   1) ESRD on hemodialysis 2) Hyperkalemia, hyponatremia at admit on 01/26/2012. S/p emergent HD on 01/26/12    -  P:  HD per Renal repeated again 7/3    GASTROINTESTINAL  Lab 02/04/12 0434  AST 19  ALT 12  ALKPHOS 135*  BILITOT 0.2*  PROT 6.3  ALBUMIN 1.6*    A:  1) History of PUD, CDIFF Colitis with pos C Diff PCR  P:   1)flagyl 2) d/c PPI 3) H2  4) monitor H/H daily  HEMATOLOGIC  Lab 02/05/12 0406 02/04/12 0434 02/03/12 0330 02/02/12 0500 02/01/12 0415 01/30/12 1000  HGB 8.6* 8.6* 8.8* 8.4* 7.5* --  HCT 26.5* 26.1* 26.2* 24.7* 22.1* --  PLT 138* 98* 72* 46* 40* --  INR -- -- -- -- -- 1.18  APTT -- -- -- -- -- --   A:  Anemic of critical illness / Thrombocytopenia  Stable, PLT improving P:   - goal Hb 7 & above ok  INFECTIOUS  Lab 02/05/12 0406 02/04/12 0434 02/03/12 0330 02/02/12 0500 02/01/12 0415  WBC 10.6* 10.0 8.0 5.9 6.9  PROCALCITON -- 3.16 3.23 -- --   Cultures:  6/23 urine >> strep agalactiae 6/23 resp >>strep agalactiae 6/23 bld >> pseudomonas, strep agalactiae 6/26 C diff pCR POS 6/27 blood X 2  >> neg  ABx  Per ID Svc Pip/ Tazo 6/23 >> 6/27 cipro 6/23 >>6/27 vanc 6/23 >> 6/27 Cefepime 6/27 (pseudomonas, Strep agalactiae)>> PO vanc 6/27 (CDIFF)>> Flagyl 6/27 (CDIFF)>> A:   1) Pseudomonas sepsis, d/t LLL PNA d/t pseudomonas 2) History of MRSA but MRSA Pcr negative on admit 01/26/2012 3) History of Hep C 4)  Group B Strep Agalactiae  bacteremia 5) c. Diff colitis  P:  Abx per ID   ENDOCRINE  Lab 02/04/12 2320 02/04/12 1652 02/04/12 0801 02/03/12 2330 02/03/12 1939  GLUCAP 94 96 110* 114* 120*   A:   1) Hypoglycemia - resolved P:    D/C CBGs/SSI 7/01  NEUROLOGIC  A:  Encephalopathy - agitated delirium, h/o polysubstance abuse P:   Cont  Duragesic patch Increase Klonopin  IV ativan and CIWA protocol   BEST PRACTICE / DISPOSITION - Level of Care:  ICU>>>tfr to SDU - Primary Service:  Critical care>>>ask TRH to assume care in am 7/4. - Consultants:  Nephrology, ID - Code Status:  Full code - Diet:  PO - DVT Px:  SCDs - GI Px: H2 - Skin Integrity:  No decubitus ulcers at admission - Social / Family: no family 7/3 at bedside  The patient is critically ill with multiple organ systems failure and requires high complexity decision making for assessment and support, frequent evaluation and titration of therapies, application of advanced monitoring technologies and extensive interpretation of multiple databases.   Critical Care Time devoted to patient care services described in this note is: 35 minutes   Shan Levans  Beeper  [phone]  Cell  [phone]  If no response or cell goes to voicemail, call beeper [phone]

## 2012-02-05 NOTE — Progress Notes (Signed)
Patient ID: [name], male   DOB: 1955-11-20, 56 y.o.   MRN: [id number]    [facility]    Date of Admission:  01/26/2012   Total days of Pseudomonas therapy 11        Day 7 cefepime        Day 7 metronidazole        Day 7 oral vancomycin Principal Problem:  *PEA (Pulseless electrical activity) Active Problems:  End stage renal disease on dialysis  Alcohol abuse  Acute respiratory failure with hypoxia  Hyperkalemia  Bacteremia due to Pseudomonas  Sepsis due to Streptococcus, group B  C. difficile colitis  Anemia  Thrombocytopenia      . albuterol  2.5 mg Nebulization Q6H  . antiseptic oral rinse  15 mL Mouth Rinse QID  . ceFEPime (MAXIPIME) IV  1 g Intravenous Q24H  . chlorhexidine  15 mL Mouth/Throat BID  . clonazePAM  1 mg Oral TID  . cloNIDine  0.3 mg Transdermal Weekly  . fentaNYL  150 mcg Transdermal Q72H  . ipratropium  0.5 mg Nebulization Q6H  . lisinopril  10 mg Oral Daily  . LORazepam  1 mg Intravenous Once  . LORazepam  2 mg Oral Q6H  . vancomycin  500 mg Oral Q6H   And  . metronidazole  500 mg Intravenous Q8H  . mupirocin cream  1 application Topical BID  . pantoprazole (PROTONIX) IV  40 mg Intravenous Q12H  . DISCONTD: ceFEPime (MAXIPIME) IV  2 g Intravenous Q M,W,F-HD  . DISCONTD: LORazepam  1 mg Intravenous Q4H    Subjective: He is extubated and feeling much better. He is having some nausea but no vomiting.  Objective: Temp:  [97.8 F (36.6 C)-99.9 F (37.7 C)] 99.2 F (37.3 C) (07/03 0400) Pulse Rate:  [113-135] 122  (07/03 0800) Resp:  [16-27] 21  (07/03 0800) BP: (107-207)/(60-102) 178/94 mmHg (07/03 0800) SpO2:  [87 %-100 %] 93 % (07/03 0840) Weight:  [78.9 kg (173 lb 15.1 oz)-79.9 kg (176 lb 2.4 oz)] 78.9 kg (173 lb 15.1 oz) (07/03 0500)  General: He is alert and comfortable Skin: Scattered abrasions of extremities are healing Lungs: Clear Cor: Regular S1 and S2 no murmurs Abdomen: Soft, quiet and  nontender  Studies/Results: Dg Chest Port 1 View  02/05/2012  *RADIOLOGY REPORT*  Clinical Data: Pulmonary edema  PORTABLE CHEST - 1 VIEW  Comparison: Chest radiograph 02/04/2012  Findings: Interval extubation and removal of NG tube.  Left venous line remains.  Stable mildly enlarged heart silhouette.  There is dense bibasilar atelectasis.  There is a stable air space disease in left lower lobe.  Increased atelectasis at the right lung base. No pneumothorax.  IMPRESSION:  1.  New right basilar atelectasis following extubation. 2.  Stable dense left lower lobe atelectasis. 3.  Left basilar air space disease unchanged.  Original Report Authenticated By: Genevive Bi, M.D.   Dg Chest Port 1 View  02/04/2012  *RADIOLOGY REPORT*  Clinical Data: Assess endotracheal tube.  Respiratory failure.  PORTABLE CHEST - 1 VIEW  Comparison: 02/03/2012.  Findings: Endotracheal tube position new high at 82 mm from the carina.  This is at the level of the clavicles.  Enteric tube remains present.  Left IJ central line terminates in the upper SVC. Cardiomegaly, bilateral basilar atelectasis and basilar airspace disease remains present.  Small bilateral pleural effusions also present.  IMPRESSION:  1.  Little interval change in pulmonary aeration and support apparatus. 2.  Endotracheal tube tip 82 mm from the carina.  This could be advanced for more secure positioning.  Original Report Authenticated By: Andreas Newport, M.D.    Assessment: He is improving on therapy for her Pseudomonas pneumonia, polymicrobial bacteremia and C. difficile colitis. He needs 3 more days of cefepime to complete 2 weeks of therapy for Pseudomonas pneumonia. I will stop his IV metronidazole now but would recommend continuing the oral vancomycin for 10 more days, giving him one week of therapy after completing cefepime. If he is discharged before completing oral vancomycin and does not have a means of paying for it I would switch him to oral  metronidazole to complete therapy.  Plan: 1. Continue cefepime for 3 more days 2. Continue oral vancomycin for 10 more days 3. Discontinue metronidazole 4. Please call me if I can be of further assistance while he is here  Cliffton Asters, MD [facility] [phone] pager   [phone] cell 02/05/2012, 9:21 AM

## 2012-02-05 NOTE — Progress Notes (Signed)
Nutrition Follow-up  Intervention:   1. If pt unable to safely transition back to PO intake, recommend placing panda for EN.  2. RD will continue to follow.    Assessment:  Pt was extubated on 7/2 am. Pt TF was d/c'd with extubation.Bedside swallow eval on 7/2 recommended pt remain NPO at this time. TO be reassessed today for ability to advance diet.   Pt continues on HD daily until volume controlled, then will transition back to TTS schedule.   Pt reports he does not think he has lost weight recently, reports going to HD regularly until this episode of missing HD for one week. Occasionally drinks Nepro shakes at HD.    Diet Order:  NPO  Meds: Scheduled Meds:    . albuterol  2.5 mg Nebulization Q6H  . antiseptic oral rinse  15 mL Mouth Rinse QID  . ceFEPime (MAXIPIME) IV  1 g Intravenous Q24H  . chlorhexidine  15 mL Mouth/Throat BID  . clonazePAM  1 mg Oral TID  . cloNIDine  0.3 mg Transdermal Weekly  . fentaNYL  150 mcg Transdermal Q72H  . ipratropium  0.5 mg Nebulization Q6H  . lisinopril  10 mg Oral Daily  . LORazepam  1 mg Intravenous Once  . LORazepam  2 mg Oral Q6H  . mupirocin cream  1 application Topical BID  . pantoprazole (PROTONIX) IV  40 mg Intravenous Q12H  . vancomycin  500 mg Oral Q6H  . DISCONTD: ceFEPime (MAXIPIME) IV  2 g Intravenous Q M,W,F-HD  . DISCONTD: LORazepam  1 mg Intravenous Q4H  . DISCONTD: metronidazole  500 mg Intravenous Q8H   Continuous Infusions:  PRN Meds:.sodium chloride, sodium chloride, sodium chloride, acetaminophen, albuterol, fentaNYL, heparin, heparin, heparin, heparin, lidocaine, lidocaine-prilocaine, ondansetron (ZOFRAN) IV, pentafluoroprop-tetrafluoroeth  Labs:  CMP     Component Value Date/Time   NA 138 02/05/2012 0406   K 4.1 02/05/2012 0406   CL 99 02/05/2012 0406   CO2 28 02/05/2012 0406   GLUCOSE 96 02/05/2012 0406   BUN 23 02/05/2012 0406   CREATININE 3.01* 02/05/2012 0406   CREATININE 8.17* 10/15/2011 1709   CALCIUM 9.5 02/05/2012  0406   CALCIUM 7.6* 04/04/2011 0315   PROT 6.3 02/04/2012 0434   ALBUMIN 1.6* 02/04/2012 0434   AST 19 02/04/2012 0434   ALT 12 02/04/2012 0434   ALKPHOS 135* 02/04/2012 0434   BILITOT 0.2* 02/04/2012 0434   GFRNONAA 22* 02/05/2012 0406   GFRAA 25* 02/05/2012 0406     Intake/Output Summary (Last 24 hours) at 02/05/12 1018 Last data filed at 02/04/12 1700  Gross per 24 hour  Intake      0 ml  Output   5081 ml  Net  -5081 ml    Weight Status:  173 lbs, trending down with daily HD,  Admission weight: 162 lbs  Re-estimated needs:  2200-2400 kcal, 90-105 gm protein  Nutrition Dx:  Inadequate oral intake r/t inability to eat AEB NPO   Goal:  EN to provide >/=90% estimated nutrition needs, no longer applicable  New Goal: Diet to advance or initiation of EN   Monitor:  Diet advance, weight, labs, HD treatments   Clarene Duke RD, LDN Pager [phone] After Hours pager [phone]

## 2012-02-05 NOTE — Progress Notes (Signed)
SLP Cancellation Note  Treatment cancelled today due to patient receiving procedure or test (HD).  Will f/u in early a.m as patient will likely be able to initiate a PO diet, based on RN's report of tolerating PO liquid medication today.  Myra Rude, M.S.,CCC-SLP Pager [phone] 02/05/2012, 3:14 PM

## 2012-02-05 NOTE — Progress Notes (Addendum)
ANTIBIOTIC CONSULT NOTE - FOLLOW UP  Pharmacy Consult for cefepime Indication: pseudomonas pneumonia/bacterermia and group B strep bacteremia  Allergies  Allergen Reactions  . Penicillins     "childhood reaction"    Patient Measurements: Height: 6' (182.9 cm) Weight: 173 lb 15.1 oz (78.9 kg) IBW/kg (Calculated) : 77.6    Vital Signs: Temp: 99.2 F (37.3 C) (07/03 0400) BP: 178/94 mmHg (07/03 0800) Pulse Rate: 122  (07/03 0800) Intake/Output from previous day: 07/02 0701 - 07/03 0700 In: 12.6 [I.V.:12.6] Out: 5081 [Stool:700] Intake/Output from this shift:    Labs:  Basename 02/05/12 0406 02/04/12 0434 02/03/12 0330  WBC 10.6* 10.0 8.0  HGB 8.6* 8.6* 8.8*  PLT 138* 98* 72*  LABCREA -- -- --  CREATININE 3.01* 3.78* 5.00*   Estimated Creatinine Clearance: 29.7 ml/min (by C-G formula based on Cr of 3.01). No results found for this basename: VANCOTROUGH:2,VANCOPEAK:2,VANCORANDOM:2,GENTTROUGH:2,GENTPEAK:2,GENTRANDOM:2,TOBRATROUGH:2,TOBRAPEAK:2,TOBRARND:2,AMIKACINPEAK:2,AMIKACINTROU:2,AMIKACIN:2, in the last 72 hours   Microbiology: Recent Results (from the past 720 hour(s))  MRSA PCR SCREENING     Status: Normal   Collection Time   01/11/12  7:46 AM      Component Value Range Status Comment   MRSA by PCR NEGATIVE  NEGATIVE Final   CULTURE, BLOOD (ROUTINE X 2)     Status: Normal   Collection Time   01/26/12  8:50 PM      Component Value Range Status Comment   Specimen Description BLOOD   Final    Special Requests     Final    Value: LEFT IJ BOTTLES DRAWN AEROBIC AND ANAEROBIC 10CC EACH   Culture  Setup Time [id number]   Final    Culture     Final    Value: GROUP B STREP(S.AGALACTIAE)ISOLATED     STAPHYLOCOCCUS SPECIES (COAGULASE NEGATIVE)     Note: THE SIGNIFICANCE OF ISOLATING THIS ORGANISM FROM A SINGLE SET OF BLOOD CULTURES WHEN MULTIPLE SETS ARE DRAWN IS UNCERTAIN. PLEASE NOTIFY THE MICROBIOLOGY DEPARTMENT WITHIN ONE WEEK IF SPECIATION AND SENSITIVITIES ARE  REQUIRED.     Note: Gram Stain Report Called to,Read Back By and Verified With: LESLIE WILSON @ 1444 01/27/12 BY KRAWS   Report Status 01/29/2012 FINAL   Final    Organism ID, Bacteria GROUP B STREP(S.AGALACTIAE)ISOLATED   Final   URINE CULTURE     Status: Normal   Collection Time   01/26/12  9:39 PM      Component Value Range Status Comment   Specimen Description URINE, CATHETERIZED   Final    Special Requests Normal   Final    Culture  Setup Time [id number]   Final    Colony Count >=100,000 COLONIES/ML   Final    Culture     Final    Value: GROUP B STREP(S.AGALACTIAE)ISOLATED     Note: TESTING AGAINST S. AGALACTIAE NOT ROUTINELY PERFORMED DUE TO PREDICTABILITY OF AMP/PEN/VAN SUSCEPTIBILITY.   Report Status 01/28/2012 FINAL   Final   CULTURE, BLOOD (ROUTINE X 2)     Status: Normal   Collection Time   01/26/12 10:06 PM      Component Value Range Status Comment   Specimen Description BLOOD RIGHT RADIAL A-LINE DRAW   Final    Special Requests BOTTLES DRAWN AEROBIC AND ANAEROBIC [facility]   Final    Culture  Setup Time [id number]   Final    Culture     Final    Value: PSEUDOMONAS AERUGINOSA     Note: Gram Stain Report Called to,Read Back By and Verified  With: ROSE COLLOM @ 2150 ON 01/27/12 BY GOLLD   Report Status 01/30/2012 FINAL   Final    Organism ID, Bacteria PSEUDOMONAS AERUGINOSA   Final   MRSA PCR SCREENING     Status: Normal   Collection Time   01/26/12 10:48 PM      Component Value Range Status Comment   MRSA by PCR NEGATIVE  NEGATIVE Final   CULTURE, RESPIRATORY     Status: Normal   Collection Time   01/29/12 12:06 AM      Component Value Range Status Comment   Specimen Description TRACHEAL ASPIRATE   Final    Special Requests NONE   Final    Gram Stain     Final    Value: NO WBC SEEN     NO SQUAMOUS EPITHELIAL CELLS SEEN     FEW GRAM POSITIVE COCCI IN PAIRS   Culture FEW PSEUDOMONAS AERUGINOSA   Final    Report Status 01/31/2012 FINAL   Final    Organism ID,  Bacteria PSEUDOMONAS AERUGINOSA   Final   CLOSTRIDIUM DIFFICILE BY PCR     Status: Abnormal   Collection Time   01/30/12  4:00 AM      Component Value Range Status Comment   C difficile by pcr POSITIVE (*) NEGATIVE Final   WOUND CULTURE     Status: Normal   Collection Time   01/30/12 12:55 PM      Component Value Range Status Comment   Specimen Description WOUND RIGHT LEG   Final    Special Requests NONE   Final    Gram Stain     Final    Value: NO WBC SEEN     NO SQUAMOUS EPITHELIAL CELLS SEEN     NO ORGANISMS SEEN   Culture NO GROWTH 2 DAYS   Final    Report Status 02/01/2012 FINAL   Final   CULTURE, BLOOD (SINGLE)     Status: Normal (Preliminary result)   Collection Time   01/30/12  9:35 PM      Component Value Range Status Comment   Specimen Description BLOOD CENTRAL LINE   Final    Special Requests BOTTLES DRAWN AEROBIC AND ANAEROBIC 10CC   Final    Culture  Setup Time 01/31/2012 03:48   Final    Culture     Final    Value:        BLOOD CULTURE RECEIVED NO GROWTH TO DATE CULTURE WILL BE HELD FOR 5 DAYS BEFORE ISSUING A FINAL NEGATIVE REPORT   Report Status PENDING   Incomplete     Anti-infectives     Start     Dose/Rate Route Frequency Ordered Stop   02/05/12 1000   ceFEPIme (MAXIPIME) 1 g in dextrose 5 % 50 mL IVPB        1 g 100 mL/hr over 30 Minutes Intravenous Every 24 hours 02/05/12 0842     01/31/12 1200   ceFEPIme (MAXIPIME) 2 g in dextrose 5 % 50 mL IVPB  Status:  Discontinued        2 g 100 mL/hr over 30 Minutes Intravenous Every M-W-F (Hemodialysis) 01/30/12 1736 02/05/12 0842   01/31/12 1000   ciprofloxacin (CIPRO) IVPB 400 mg  Status:  Discontinued        400 mg 200 mL/hr over 60 Minutes Intravenous Every 24 hours 01/30/12 1006 01/30/12 1705   01/30/12 1800   ceFEPIme (MAXIPIME) 1 g in dextrose 5 % 50 mL IVPB  1 g 100 mL/hr over 30 Minutes Intravenous NOW 01/30/12 1736 01/30/12 2020   01/30/12 1730   ceFEPIme (MAXIPIME) 2 g in dextrose 5 % 50 mL  IVPB  Status:  Discontinued        2 g 100 mL/hr over 30 Minutes Intravenous 3 times per day 01/30/12 1724 01/30/12 1732   01/30/12 1200   vancomycin (VANCOCIN) 50 mg/mL oral solution 500 mg        500 mg Oral 4 times per day 01/30/12 1001 02/13/12 1159   01/30/12 1100   metroNIDAZOLE (FLAGYL) IVPB 500 mg        500 mg 100 mL/hr over 60 Minutes Intravenous Every 8 hours 01/30/12 1001 02/13/12 1059   01/30/12 1015   ciprofloxacin (CIPRO) IVPB 400 mg        400 mg 200 mL/hr over 60 Minutes Intravenous NOW 01/30/12 1006 01/30/12 1251   01/29/12 1200   vancomycin (VANCOCIN) 750 mg in sodium chloride 0.9 % 150 mL IVPB  Status:  Discontinued        750 mg 150 mL/hr over 60 Minutes Intravenous Every M-W-F (Hemodialysis) 01/29/12 0615 01/29/12 0755   01/28/12 2308   ciprofloxacin (CIPRO) IVPB 400 mg  Status:  Discontinued        400 mg 200 mL/hr over 60 Minutes Intravenous Every 24 hours 01/28/12 0941 01/29/12 0755   01/27/12 2230   ciprofloxacin (CIPRO) IVPB 400 mg  Status:  Discontinued        400 mg 200 mL/hr over 60 Minutes Intravenous Every 12 hours 01/27/12 2209 01/28/12 0941   01/26/12 2300   vancomycin (VANCOCIN) 1,500 mg in sodium chloride 0.9 % 500 mL IVPB        1,500 mg 250 mL/hr over 120 Minutes Intravenous  Once 01/26/12 2120 01/27/12 0226   01/26/12 2200   piperacillin-tazobactam (ZOSYN) IVPB 2.25 g  Status:  Discontinued        2.25 g 100 mL/hr over 30 Minutes Intravenous 3 times per day 01/26/12 2118 01/30/12 1705          Assessment: Patient is a 56 y.o with cefepime started on 6/28 for pseudomonas PNA/bacteriemia and Group B strep bacteremia.  Plan to treat with abx for 14 days per ID.  Today is cefepime day #7; antibiotic day #11.  Pt has been going to HD more often than MWF, but cefepime is scheduled MWF post-HD.   Plan:  1) Change cefepime to 1g IV daily until HD back to only 3 times a week. 2) Will follow.  Reece Leader, Pharm D 02/05/2012 8:51  AM

## 2012-02-05 NOTE — Progress Notes (Signed)
Subjective:  Extubated, awake, responsive. Doesn't recall events. 4kg off with HD yesterday  Objective:    Vital signs in last 24 hours: Filed Vitals:   02/05/12 0600 02/05/12 0700 02/05/12 0800 02/05/12 0840  BP: 158/89 167/92 178/94   Pulse: 117 117 122   Temp:      TempSrc:      Resp: 20 20 21    Height:      Weight:      SpO2: 93% 92% 95% 93%   Weight change: -2.9 kg (-6 lb 6.3 oz)  Intake/Output Summary (Last 24 hours) at 02/05/12 1234 Last data filed at 02/04/12 1700  Gross per 24 hour  Intake      0 ml  Output   5081 ml  Net  -5081 ml   Labs: Basic Metabolic Panel:  Lab 02/05/12 4098 02/04/12 0434 02/03/12 0330 02/02/12 0500 02/01/12 0415 01/31/12 0515 01/30/12 0430  NA 138 132* 130* 134* 137 132* 134*  K 4.1 3.7 3.5 3.1* 3.1* 3.0* 3.5  CL 99 98 96 99 99 96 97  CO2 28 26 22 23 27 23 24   GLUCOSE 96 128* 114* 109* 146* 115* 113*  BUN 23 39* 54* 39* 19 36* 26*  CREATININE 3.01* 3.78* 5.00* 4.31* 3.28* 5.14* 4.16*  ALB -- -- -- -- -- -- --  CALCIUM 9.5 9.7 10.2 9.4 8.3* 8.2* 8.1*  PHOS -- -- -- -- -- -- --   Liver Function Tests:  Lab 02/04/12 0434  AST 19  ALT 12  ALKPHOS 135*  BILITOT 0.2*  PROT 6.3  ALBUMIN 1.6*   No results found for this basename: LIPASE:3,AMYLASE:3 in the last 168 hours No results found for this basename: AMMONIA:3 in the last 168 hours CBC:  Lab 02/05/12 0406 02/04/12 0434 02/03/12 0330 02/02/12 0500  WBC 10.6* 10.0 8.0 5.9  NEUTROABS -- -- -- --  HGB 8.6* 8.6* 8.8* 8.4*  HCT 26.5* 26.1* 26.2* 24.7*  MCV 98.9 97.0 96.3 94.6  PLT 138* 98* 72* 46*    Physical Exam:  Blood pressure 178/94, pulse 122, temperature 99.2 F (37.3 C), temperature source Oral, resp. rate 21, height 6' (1.829 m), weight 78.9 kg (173 lb 15.1 oz), SpO2 93.00%.  Gen: awake, extubated, alert, no distress JXB:JYNWGNF, no rub or gallop  Resp:occ rhonchi  AOZ:HYQMVHQIO, +BS, soft  Ext- improving anasarca, scrotal edema Lt AVG +T/B  Outpatient Dialysis:  [facility] LUA AVGG. 4 hr, EDW 69.5 kg. EPO 4600u. Zemplar 1 ug. Neprox 1. Heparin 2000u.  3.0K/2.25Ca bath. 400/800.  Assessment/Plan   1. PNA/bacteremia/UTI/Cdif-  per ID/primary 2. Volume- improving, HD again today and tomorrow.  3. Resp failure- off vent now, better 4. ESRD TTS HD- HD today off schedule 5. Alcoholism 6. Anemia:/ACDz and ABLA 7. thrombocytopenia 8. s/p PEA Arrest 9. Malnutrition 10. HTN- was on fosinopril 10/d clonidine 0.3 bid at home.  Getting TTS-3 patch and lisinopril (Rx sub) here.  Vinson Moselle  MD [facility] [phone] pgr    [phone] cell 02/05/2012, 12:34 PM

## 2012-02-06 ENCOUNTER — Inpatient Hospital Stay (HOSPITAL_COMMUNITY): Payer: Medicare Other

## 2012-02-06 LAB — BASIC METABOLIC PANEL
GFR calc Af Amer: 23 mL/min — ABNORMAL LOW (ref >90)
GFR calc non Af Amer: 20 mL/min — ABNORMAL LOW (ref >90)
Glucose, Bld: 98 mg/dL (ref 70–99)
Potassium: 3.6 mEq/L (ref 3.5–5.1)
Sodium: 137 mEq/L (ref 135–145)

## 2012-02-06 LAB — CBC
Hemoglobin: 8.3 g/dL — ABNORMAL LOW (ref 13.0–17.0)
MCH: 31.2 pg (ref 26.0–34.0)
RBC: 2.66 MIL/uL — ABNORMAL LOW (ref 4.22–5.81)

## 2012-02-06 LAB — GLUCOSE, CAPILLARY: Glucose-Capillary: 97 mg/dL (ref 70–99)

## 2012-02-06 MED ORDER — NITROGLYCERIN 0.4 MG SL SUBL
SUBLINGUAL_TABLET | SUBLINGUAL | Status: AC
  Administered 2012-02-06: 0.4 mg
  Filled 2012-02-06: qty 75

## 2012-02-06 MED ORDER — LORAZEPAM 0.5 MG PO TABS
ORAL_TABLET | ORAL | Status: AC
  Administered 2012-02-06: 2 mg via ORAL
  Filled 2012-02-06: qty 4

## 2012-02-06 MED ORDER — RENA-VITE PO TABS
1.0000 | ORAL_TABLET | Freq: Every day | ORAL | Status: DC
  Administered 2012-02-06 – 2012-02-13 (×8): 1 via ORAL
  Filled 2012-02-06 (×11): qty 1

## 2012-02-06 NOTE — Progress Notes (Signed)
Chaplain followed up with patient per patient's request. Chaplain revisited the patient's fear of dying with him. The patient stated that he no longer felt afraid and that he "was in his comfort zone." Chaplain listened and provided emotional support. Follow up as needed.

## 2012-02-06 NOTE — Progress Notes (Signed)
Speech Language Pathology Dysphagia Treatment Patient Details Name: [name] MRN: [id number] DOB: [birth year] Today's Date: 02/06/2012 Time: 4098-1191 SLP Time Calculation (min): 21 min  Assessment / Plan / Recommendation Clinical Impression  Treatment focused on skilled observation of swallowing function with current diet.  Patient lethargic and although easily aroused, required mod-max verbal and tactile cues to maintain adequate level of arousal for safe po intake. SLP assisted in self-feeding po trials. Vocal quality remains hoarse at baseline and continued to be hoarse through out po trials. Patient present with s/s of aspiration with thin liquids characterized by throat clearing and coughing post swallow due to suspected delayed swallow initiation. Appeared to be protecting airway with pureed solid trials with more timely hyo-laryngeal elevation and excursion. Dysphagia noted today likely multi-factorial including prolongued intubation, vocal dysfunction, and AMS all impacting function and increasing aspiration risk.  Per RN, night shift reported overall good tolerance of advanced diet and that patient received Ativan during HD which may be impacting alertness. Will continue current diet at this time however patient should only be allowed to eat when fully alert and should be monitored closely for s/s of aspiration given multiple risk factors. SLP will f/u closely. Please resume NPO status if patient presents with s/s of aspiration with pos this evening.     Diet Recommendation  Continue with Current Diet: Regular;Thin liquid       Pertinent Vitals/Pain n/a   Swallowing Goals  SLP Swallowing Goals Goal #3: Patient will consume PO trials of various consistencies without evidence of a pharyngeal phase dysphagia and minimal verbal cues (in order to determine diet or need for an objective test). Swallow Study Goal #3 - Progress: Progressing toward goal  General Temperature Spikes Noted:  No Respiratory Status: Supplemental O2 delivered via (comment) (4 L nasal cannula) Behavior/Cognition: Lethargic;Cooperative;Requires cueing;Doesn't follow directions;Decreased sustained attention Oral Cavity - Dentition: Edentulous Patient Positioning: Upright in bed       Dysphagia Treatment Treatment focused on: Skilled observation of diet tolerance;Utilization of compensatory strategies Treatment Methods/Modalities: Skilled observation;Differential diagnosis Patient observed directly with PO's: Yes Type of PO's observed: Dysphagia 1 (puree);Thin liquids Feeding: Able to feed self;Needs assist Liquids provided via: Cup Oral Phase Signs & Symptoms: Anterior loss/spillage Pharyngeal Phase Signs & Symptoms: Suspected delayed swallow initiation;Multiple swallows;Immediate cough;Delayed cough;Delayed throat clear Type of cueing: Verbal;Tactile Amount of cueing: Maximal   GO   Ferdinand Lango MA, CCC-SLP [phone]   Danene Montijo Meryl 02/06/2012, 2:23 PM

## 2012-02-06 NOTE — Progress Notes (Signed)
Internal Medicine Teaching Service Attending Note This patient has been seen and discussed with the house staff. Please see their note for complete details. I concur with their findings with the following additions/corrections:  Hospital Course: 56 yr. Old WM w/ hx HTN, COPD, Hepatitis C, polysubstance abuse, chronic pancreatitis, PUD, ESRD on HD M/W/F, admitted s/p PEA. The patient had reportedly missed HD for one week. EMS found the patient in PEA arrest, intubated him, ACLS initiated with return to spontaneous circulation in 6 minutes. He was noted to have EKG w/ peaked T waves and a K of 6.4. He was treated with bicarbonate, calcium gluconate, and insulin. He was emergently dialyzed and started on hypothermia protocol. His CXR showed evidence of pulmonary edema which improved with emergent HD. Cardiology initially evaluated patient for cardiac arrest and EKG changes, noted to be secondary to hyperkalemia and fluid overload secondary to missed HD.  He was maintained on pressors as well and slowly weaned off. He was pancultured on admission and started on Vanc/Zosyn/Cipro. He was found to have evidence of strep agalactiae in urine, urine, and blood from 6/23 BC. He was also noted to have pseudomonas in blood and evidence for LLL PNA. Due to significant diarrhea, stool C diff sent with was positive.  Zosyn given from 6/23 to 6/27, Cipro given from 6/23 to 6/27, Vanc IV from 6/23 to 6/27. PO Vanc initiated on 6/27 for C diff colitis. Flagyl initiated on 6/27 for C diff colitis and stopped today 7/4. Cefepime initiated on 6/27 for coverage of pseudomonas and strep agalactiae bacteremia. He needs two more days, including today 7/4, of cefepime to complete 14 days of treatment for Pseudomonas pneumonia. Oral Vancomycin should be continued for 9 more days, including today 7/4. ID has been closely following the patient. He had dialysis today and was noted to be confused early in the AM. He had an episode of CP,  given NTG, and EKG ordered without acute ST changes.  Subjective: Currently he admits to some SOB, CP that can be reproduced with palpating his chest. Discussed with RN who states he was confused in HD and received Ativan because he was pulling on lines.  Objective: Vital signs in last 24 hours: Temp:  [98.6 F (37 C)-100.1 F (37.8 C)] 100.1 F (37.8 C) (07/04 1130) Pulse Rate:  [106-125] 120  (07/04 1130) Resp:  [16-27] 22  (07/04 1130) BP: (116-173)/(62-101) 142/78 mmHg (07/04 1130) SpO2:  [91 %-99 %] 93 % (07/04 1130) Weight:  [70.1 kg (154 lb 8.7 oz)-76.2 kg (167 lb 15.9 oz)] 70.1 kg (154 lb 8.7 oz) (07/04 1130) Weight change: -0.2 kg (-7.1 oz) Last BM Date: 02/05/12  Intake/Output from previous day: 07/03 0701 - 07/04 0700 In: 100 [P.O.:60; I.V.:40] Out: 4200 [Stool:200] Intake/Output this shift: Total I/O In: -  Out: 3343 [Other:3343]  Gen: Alert, oriented to person, place and situation but not to time. HEENT: EOMI, PERRLA, no icterus. CV: S1S2, no m/r/g, tachycardic. PULM: CTA bilat. ABD/GI: Soft, +BS, diffuse tenderness to deep palpation but no guarding or reboud tenderness. LE: 2/4 pulses, no c/c/e. Neuro: CN II-XII intact. Moves all extremities.  Lab Results:  Basename 02/06/12 0545 02/05/12 0406  WBC 7.0 10.6*  HGB 8.3* 8.6*  HCT 26.3* 26.5*  PLT 167 138*   BMET  Basename 02/06/12 0545 02/05/12 0406  NA 137 138  K 3.6 4.1  CL 99 99  CO2 28 28  GLUCOSE 98 96  BUN 25* 23  CREATININE 3.20* 3.01*  CALCIUM 9.5 9.5     Studies/Results: Dg Chest Port 1 View  02/05/2012  *RADIOLOGY REPORT*  Clinical Data: Pulmonary edema  PORTABLE CHEST - 1 VIEW  Comparison: Chest radiograph 02/04/2012  Findings: Interval extubation and removal of NG tube.  Left venous line remains.  Stable mildly enlarged heart silhouette.  There is dense bibasilar atelectasis.  There is a stable air space disease in left lower lobe.  Increased atelectasis at the right lung base. No  pneumothorax.  IMPRESSION:  1.  New right basilar atelectasis following extubation. 2.  Stable dense left lower lobe atelectasis. 3.  Left basilar air space disease unchanged.  Original Report Authenticated By: Genevive Bi, M.D.    Medications:  I have reviewed the patient's current medications. Prior to Admission:  Prescriptions prior to admission  Medication Sig Dispense Refill  . albuterol (PROVENTIL HFA;VENTOLIN HFA) 108 (90 BASE) MCG/ACT inhaler Inhale 2 puffs into the lungs every 4 (four) hours as needed. For shortness of breath.      . calcium acetate (PHOSLO) 667 MG capsule Take 667 mg by mouth 3 (three) times daily with meals.      . clonazePAM (KLONOPIN) 0.5 MG tablet Take 0.5 mg by mouth 2 (two) times daily as needed. For anxiety      . cloNIDine (CATAPRES) 0.3 MG tablet Take 0.3 mg by mouth 2 (two) times daily.      . diazepam (VALIUM) 5 MG tablet Take 20 mg by mouth every 12 (twelve) hours as needed. For anxiety or sleep      . fosinopril (MONOPRIL) 10 MG tablet Take 10 mg by mouth at bedtime.      . gabapentin (NEURONTIN) 300 MG capsule Take 300 mg by mouth at bedtime. For burning feet      . lidocaine-prilocaine (EMLA) cream Apply 1 application topically as directed. Apply to dialysis site 30 minutes prior to treatment      . oxyCODONE-acetaminophen (PERCOCET) 5-325 MG per tablet Take 1 tablet by mouth every 4 (four) hours as needed. For pain      . pregabalin (LYRICA) 75 MG capsule Take 75 mg by mouth 3 (three) times daily.      Marland Kitchen tiotropium (SPIRIVA) 18 MCG inhalation capsule Place 18 mcg into inhaler and inhale daily.      . traMADol (ULTRAM) 50 MG tablet Take 100 mg by mouth every 6 (six) hours as needed.      . zolpidem (AMBIEN) 5 MG tablet Take 5 mg by mouth at bedtime as needed. For sleep      . latanoprost (XALATAN) 0.005 % ophthalmic solution Place 1 drop into both eyes daily.      . peg 3350 powder (MOVIPREP) 100 G SOLR Take 1 kit by mouth as directed. Name brand  only       Scheduled:   . albuterol  2.5 mg Nebulization Q6H  . antiseptic oral rinse  15 mL Mouth Rinse BID  . ceFEPime (MAXIPIME) IV  1 g Intravenous Q24H  . clonazePAM      . clonazePAM  1 mg Oral TID  . cloNIDine  0.3 mg Transdermal Weekly  . famotidine  20 mg Oral Daily  . fentaNYL  150 mcg Transdermal Q72H  . ipratropium  0.5 mg Nebulization Q6H  . lisinopril  10 mg Oral Daily  . LORazepam  2 mg Oral Q6H  . multivitamin  1 tablet Oral QHS  . mupirocin cream  1 application Topical BID  . nitroGLYCERIN      .  vancomycin  500 mg Oral Q6H  . DISCONTD: antiseptic oral rinse  15 mL Mouth Rinse QID  . DISCONTD: chlorhexidine  15 mL Mouth/Throat BID  . DISCONTD: pantoprazole (PROTONIX) IV  40 mg Intravenous Q12H   Continuous:  GNF:AOZHYQ chloride, acetaminophen, albuterol, heparin, heparin, heparin, heparin, heparin, lidocaine, lidocaine-prilocaine, ondansetron (ZOFRAN) IV, pentafluoroprop-tetrafluoroeth, DISCONTD: sodium chloride, DISCONTD: sodium chloride, DISCONTD: fentaNYL  Assessment/Plan: 56 yr. Old WM w/ hx HTN, COPD, Hepatitis C, polysubstance abuse, alcohol abuse, chronic pancreatitis, PUD, ESRD on HD M/W/F, admitted s/p PEA, with polymicrobial bacteremia, sepsis, C diff colitis.  1) Pseudomonas pneumonia/sepsis/polymicrobial bacteremia: ID following. Continue Cefepime to complete 14 days of therapy as described in hospital course. 2) C diff colitis: Continue Vanc PO for 9 more days, including today.  3) Delirium: Multifactorial, secondary to infection and hx EtOH abuse. Watch closely and wean off if does not improve. He appears more oriented this afternoon compared to the description given to my by RN as to how he was this morning. 4) ESRD on HD M/W/F: Nephrology following, received HD this AM as well. 5) Anemia of Chronic disease: Per Nephro recs. Hold iron therapy for now. Aranesp per Nephro. Rest per resident note.  Jonah Blue 02/06/2012, 5:15 PM

## 2012-02-06 NOTE — Progress Notes (Signed)
Subjective: Episode of chest pain in HD today, EKG showed no acute changes. Objective: Vital signs in last 24 hours: Filed Vitals:   02/06/12 1000 02/06/12 1030 02/06/12 1120 02/06/12 1130  BP: 136/90 135/84 127/83 142/78  Pulse: 117 122 123 120  Temp:    100.1 F (37.8 C)  TempSrc:    Oral  Resp: 26 27 21 22   Height:      Weight:    154 lb 8.7 oz (70.1 kg)  SpO2:    93%   Weight change: -7.1 oz (-0.2 kg)  Intake/Output Summary (Last 24 hours) at 02/06/12 1318 Last data filed at 02/06/12 1130  Gross per 24 hour  Intake    100 ml  Output   7543 ml  Net  -7443 ml   Vitals reviewed. General: Propped up in bed, having some resp. difficulty HEENT: PERRL, EOMI Cardiac: Tachycardic Pulm: Course, shallow breaths Abd: Soft, diffusely TTP, ecchymoses from subcutaneous heparin injections Ext: Warm and well perfused, no pedal edema, SCDs in place Neuro: Disoriented, not alert or oriented- unable to give name or location  Lab Results: Basic Metabolic Panel:  Lab 02/06/12 1610 02/05/12 0406  NA 137 138  K 3.6 4.1  CL 99 99  CO2 28 28  GLUCOSE 98 96  BUN 25* 23  CREATININE 3.20* 3.01*  CALCIUM 9.5 9.5  MG -- --  PHOS -- --   Liver Function Tests:  Lab 02/04/12 0434  AST 19  ALT 12  ALKPHOS 135*  BILITOT 0.2*  PROT 6.3  ALBUMIN 1.6*   CBC:  Lab 02/06/12 0545 02/05/12 0406  WBC 7.0 10.6*  NEUTROABS -- --  HGB 8.3* 8.6*  HCT 26.3* 26.5*  MCV 98.9 98.9  PLT 167 138*   BNP:  Lab 02/04/12 0433  PROBNP 30359.0*   CBG:  Lab 02/05/12 0821 02/04/12 2320 02/04/12 1652 02/04/12 0801 02/03/12 2330 02/03/12 1939  GLUCAP 101* 94 96 110* 114* 120*   Urine Drug Screen: Drugs of Abuse     Component Value Date/Time   LABOPIA NONE DETECTED 12/20/2011 0026   COCAINSCRNUR NONE DETECTED 12/20/2011 0026   COCAINSCRNUR NEG 09/27/2010 1551   LABBENZ POSITIVE* 12/20/2011 0026   LABBENZ NEG 09/27/2010 1551   AMPHETMU NONE DETECTED 12/20/2011 0026   AMPHETMU NEG 09/27/2010 1551     THCU NONE DETECTED 12/20/2011 0026   LABBARB NONE DETECTED 12/20/2011 0026   Micro Results: Recent Results (from the past 240 hour(s))  CULTURE, RESPIRATORY     Status: Normal   Collection Time   01/29/12 12:06 AM      Component Value Range Status Comment   Specimen Description TRACHEAL ASPIRATE   Final    Special Requests NONE   Final    Gram Stain     Final    Value: NO WBC SEEN     NO SQUAMOUS EPITHELIAL CELLS SEEN     FEW GRAM POSITIVE COCCI IN PAIRS   Culture FEW PSEUDOMONAS AERUGINOSA   Final    Report Status 01/31/2012 FINAL   Final    Organism ID, Bacteria PSEUDOMONAS AERUGINOSA   Final   CLOSTRIDIUM DIFFICILE BY PCR     Status: Abnormal   Collection Time   01/30/12  4:00 AM      Component Value Range Status Comment   C difficile by pcr POSITIVE (*) NEGATIVE Final   WOUND CULTURE     Status: Normal   Collection Time   01/30/12 12:55 PM  Component Value Range Status Comment   Specimen Description WOUND RIGHT LEG   Final    Special Requests NONE   Final    Gram Stain     Final    Value: NO WBC SEEN     NO SQUAMOUS EPITHELIAL CELLS SEEN     NO ORGANISMS SEEN   Culture NO GROWTH 2 DAYS   Final    Report Status 02/01/2012 FINAL   Final   CULTURE, BLOOD (SINGLE)     Status: Normal   Collection Time   01/30/12  9:35 PM      Component Value Range Status Comment   Specimen Description BLOOD CENTRAL LINE   Final    Special Requests BOTTLES DRAWN AEROBIC AND ANAEROBIC 10CC   Final    Culture  Setup Time 01/31/2012 03:48   Final    Culture NO GROWTH 5 DAYS   Final    Report Status 02/06/2012 FINAL   Final    Studies/Results: Dg Chest Port 1 View  02/05/2012  *RADIOLOGY REPORT*  Clinical Data: Pulmonary edema  PORTABLE CHEST - 1 VIEW  Comparison: Chest radiograph 02/04/2012  Findings: Interval extubation and removal of NG tube.  Left venous line remains.  Stable mildly enlarged heart silhouette.  There is dense bibasilar atelectasis.  There is a stable air space disease in  left lower lobe.  Increased atelectasis at the right lung base. No pneumothorax.  IMPRESSION:  1.  New right basilar atelectasis following extubation. 2.  Stable dense left lower lobe atelectasis. 3.  Left basilar air space disease unchanged.  Original Report Authenticated By: Genevive Bi, M.D.   Medications: I have reviewed the patient's current medications. Scheduled Meds:   . albuterol  2.5 mg Nebulization Q6H  . antiseptic oral rinse  15 mL Mouth Rinse BID  . ceFEPime (MAXIPIME) IV  1 g Intravenous Q24H  . clonazePAM      . clonazePAM  1 mg Oral TID  . cloNIDine  0.3 mg Transdermal Weekly  . famotidine  20 mg Oral Daily  . fentaNYL  150 mcg Transdermal Q72H  . ipratropium  0.5 mg Nebulization Q6H  . lisinopril  10 mg Oral Daily  . LORazepam  2 mg Oral Q6H  . multivitamin  1 tablet Oral QHS  . mupirocin cream  1 application Topical BID  . nitroGLYCERIN      . pentafluoroprop-tetrafluoroeth      . vancomycin  500 mg Oral Q6H  . DISCONTD: antiseptic oral rinse  15 mL Mouth Rinse QID  . DISCONTD: chlorhexidine  15 mL Mouth/Throat BID  . DISCONTD: pantoprazole (PROTONIX) IV  40 mg Intravenous Q12H   Continuous Infusions:  PRN Meds:.sodium chloride, acetaminophen, albuterol, heparin, heparin, heparin, heparin, heparin, lidocaine, lidocaine-prilocaine, ondansetron (ZOFRAN) IV, pentafluoroprop-tetrafluoroeth, DISCONTD: sodium chloride, DISCONTD: sodium chloride, DISCONTD: fentaNYL Assessment/Plan: 56yo M with ESRD on HD was found down in his home with PEA after missing dialysis for 1 week. 1. PEA (Pulseless electrical activity) - Found down at home by EMS and s/p resuscitation and hypothermia protocol. Likely induced by hyperkalemia from missing dialysis sessions.   2. End stage renal disease on dialysis - Normally MWF but off schedule. Last dialysis 7/3 and per renal will get HD tomorrow as well.   3. Alcohol abuse - Clonazepam and lorazepam scheduled - Continue meds.   4. Acute  respiratory failure with hypoxia - Resolved and off vent succesfully. - Continue to monitor closely.   5. Hyperkalemia - From missing dialysis- getting  regular dialysis now and resolved. K 3.6 today. 6. Encephalopathy - Resolving.  - Will wean off ativan and clonazepam as able.   7. Bacteremia due to Pseudomonas - Blood culture drawn 6/23 did grow pseudomonas and this was in tracheal aspirate as well. He did also grow some group B strep from his hemodialysis access and urine. Repeat blood culture on 6/27 was NTD. Currently on Day 11 of therapy. He does need an additional 3 days of cefepime therapy to complete 14 day treatment for pseudomonas pneumonia and polymicrobial bacteremia.  - Continue Cefepime x 14days  8. Sepsis due to Streptococcus, group B: - Continue abx.   9. C. difficile colitis -  - Continue Vancomycin x10 days tot. (day 1), per ID  10. Anemia - acute blood loss anemia on top of chronic disease anemia. Received one unit of blood with dialysis during this stay. No current blood loss noted. CBCs have been stable the last several days.  - am CBC   11. Thrombocytopenia - chronic from end stage organ dysfunction, likely acutely worsened due to sepsis and bacteremia. Platelets 167 today.  12. DVT ppx - SCDs     LOS: 11 days   Genelle Gather 02/06/2012, 1:18 PM

## 2012-02-06 NOTE — Progress Notes (Signed)
Subjective: " I' m on the fishing boat", reoriented  Recognizing on hd/ Earlier Episode of Chest pain with hd  Had htn   Sl ntg given with  bp stable/ ekg no acute t wave changes , also Ativan given with cp resolved Objective Vital signs in last 24 hours: Filed Vitals:   02/06/12 0800 02/06/12 0829 02/06/12 0835 02/06/12 0855  BP: 132/75 170/99 169/97 173/101  Pulse: 106 114 115 121  Temp:      TempSrc:      Resp: 18 23 26 24   Height:      Weight:      SpO2:       Weight change: -0.2 kg (-7.1 oz)  Intake/Output Summary (Last 24 hours) at 02/06/12 1005 Last data filed at 02/06/12 0200  Gross per 24 hour  Intake    100 ml  Output   4200 ml  Net  -4100 ml   Labs: Basic Metabolic Panel:  Lab 02/06/12 6213 02/05/12 0406 02/04/12 0434  NA 137 138 132*  K 3.6 4.1 3.7  CL 99 99 98  CO2 28 28 26   GLUCOSE 98 96 128*  BUN 25* 23 39*  CREATININE 3.20* 3.01* 3.78*  CALCIUM 9.5 9.5 9.7  ALB -- -- --  PHOS -- -- --   Liver Function Tests:  Lab 02/04/12 0434  AST 19  ALT 12  ALKPHOS 135*  BILITOT 0.2*  PROT 6.3  ALBUMIN 1.6*   No results found for this basename: LIPASE:3,AMYLASE:3 in the last 168 hours No results found for this basename: AMMONIA:3 in the last 168 hours CBC:  Lab 02/06/12 0545 02/05/12 0406 02/04/12 0434 02/03/12 0330 02/02/12 0500  WBC 7.0 10.6* 10.0 -- --  NEUTROABS -- -- -- -- --  HGB 8.3* 8.6* 8.6* -- --  HCT 26.3* 26.5* 26.1* -- --  MCV 98.9 98.9 97.0 96.3 94.6  PLT 167 138* 98* -- --   Cardiac Enzymes: No results found for this basename: CKTOTAL:5,CKMB:5,CKMBINDEX:5,TROPONINI:5 in the last 168 hours CBG:  Lab 02/05/12 0821 02/04/12 2320 02/04/12 1652 02/04/12 0801 02/03/12 2330  GLUCAP 101* 94 96 110* 114*    Physical Exam: General: Slightly confused on  Hd, sleeping with ativan given , awoken no sob and cp resolved Heart: Tachy regular, no rub heard Lungs:rare rhonchi Abdomen: Bs=+, soft , tender epigastric area Extremities: Dialysis  Access: no pedal edema, patent left upper arm avgg with   Area of errosin upper portion  Outpatient Dialysis: [facility] LUA AVGG. 4 hr, EDW 69.5 kg. EPO 4600u. Zemplar 1 ug. Neprox 1. Heparin 2000u. 3.0K/2.25Ca bath. 400/800.  Assessment/Plan  1. PNA/bacteremia/UTI/Cdif- per ID/primary 2. Volume excess/anasarca-  74.4 kg today with op edw 69.5 kg. Had extra HD this week, much better.  3. ESRD TTS/[facility]- HD today and then resume usual TTS schedule  4. Alcoholism 5. Anemia:/ACDz and ABLA= hgb 8.3 start Aranesp60mcg today op epo was 4600/ not on iron with sepsis 6. thrombocytopenia 7. s/p PEA Arrest 8. Malnutrition 9. HTN- was on fosinopril 10/d clonidine 0.3 bid at home. Getting TTS-3 patch and lisinopril (Rx sub) here 10. Secondary Hyperparathyroidism=Ca=9.5 with alb 1.6  Hold Zemplar fu phos and ca levels/ no binders now. Lenny Pastel, PA-C [facility] [phone] 02/06/2012,10:05 AM  LOS: 11 days   Patient seen and examined and agree with assessment and plan as above with additions as indicated.  Vinson Moselle  MD [facility] [phone] pgr    [phone] cell 02/06/2012, 11:55  AM     

## 2012-02-06 NOTE — Procedures (Signed)
I was present at this dialysis session. I have reviewed the session itself and made appropriate changes.   Vinson Moselle, MD [employer] 02/06/2012, 11:52 AM

## 2012-02-06 NOTE — Progress Notes (Signed)
Speech Pathology Cancellation Note  Treatment cancelled today due to medical issues with patient which prohibited therapy and patient receiving procedure or test.   Patient in HD and c/o chest pain to RN upon arrival with elevated BP.  RN ordering a stat EKG. SLP will f/u later this morning or when patient more stable, to complete a bedside swallow reassessment.  Patient's vocal quality was audibly improved.  Expect patient's swallow issues to be resolved.   Myra Rude, M.S.,CCC-SLP Pager [phone] 02/06/2012, 8:30 AM

## 2012-02-07 ENCOUNTER — Inpatient Hospital Stay (HOSPITAL_COMMUNITY): Payer: Medicare Other

## 2012-02-07 DIAGNOSIS — N186 End stage renal disease: Secondary | ICD-10-CM

## 2012-02-07 DIAGNOSIS — R7881 Bacteremia: Secondary | ICD-10-CM

## 2012-02-07 DIAGNOSIS — B965 Pseudomonas (aeruginosa) (mallei) (pseudomallei) as the cause of diseases classified elsewhere: Secondary | ICD-10-CM

## 2012-02-07 DIAGNOSIS — A0472 Enterocolitis due to Clostridium difficile, not specified as recurrent: Secondary | ICD-10-CM

## 2012-02-07 DIAGNOSIS — A409 Streptococcal sepsis, unspecified: Secondary | ICD-10-CM

## 2012-02-07 DIAGNOSIS — B951 Streptococcus, group B, as the cause of diseases classified elsewhere: Secondary | ICD-10-CM

## 2012-02-07 DIAGNOSIS — A419 Sepsis, unspecified organism: Secondary | ICD-10-CM

## 2012-02-07 DIAGNOSIS — F1010 Alcohol abuse, uncomplicated: Secondary | ICD-10-CM

## 2012-02-07 LAB — RENAL FUNCTION PANEL
CO2: 28 mEq/L (ref 19–32)
Chloride: 99 mEq/L (ref 96–112)
GFR calc Af Amer: 21 mL/min — ABNORMAL LOW (ref >90)
GFR calc non Af Amer: 18 mL/min — ABNORMAL LOW (ref >90)
Potassium: 3.7 mEq/L (ref 3.5–5.1)
Sodium: 139 mEq/L (ref 135–145)

## 2012-02-07 LAB — CBC
Hemoglobin: 8.1 g/dL — ABNORMAL LOW (ref 13.0–17.0)
RBC: 2.53 MIL/uL — ABNORMAL LOW (ref 4.22–5.81)
WBC: 4.5 10*3/uL (ref 4.0–10.5)

## 2012-02-07 MED ORDER — HEPARIN SODIUM (PORCINE) 1000 UNIT/ML DIALYSIS: 2000.0000 [IU] | INTRAMUSCULAR | Status: DC | PRN

## 2012-02-07 MED ORDER — LORAZEPAM 1 MG PO TABS
1.0000 mg | ORAL_TABLET | ORAL | Status: DC | PRN
  Administered 2012-02-07 – 2012-02-08 (×4): 1 mg via ORAL
  Filled 2012-02-07 (×4): qty 1

## 2012-02-07 MED ORDER — HYDROCODONE-ACETAMINOPHEN 5-325 MG PO TABS
1.0000 | ORAL_TABLET | ORAL | Status: DC | PRN
  Administered 2012-02-07: 1 via ORAL
  Filled 2012-02-07: qty 1

## 2012-02-07 NOTE — Progress Notes (Signed)
Subjective: No acute events. Objective: Vital signs in last 24 hours: Filed Vitals:   02/07/12 0800 02/07/12 0803 02/07/12 0828 02/07/12 1132  BP: 167/96 167/96  168/89  Pulse: 98 101  104  Temp:  99.7 F (37.6 C)  99.6 F (37.6 C)  TempSrc:  Oral  Oral  Resp:      Height:      Weight:      SpO2:  96% 100% 94%   Weight change: -11 lb 11 oz (-5.3 kg)  Intake/Output Summary (Last 24 hours) at 02/07/12 1409 Last data filed at 02/07/12 0200  Gross per 24 hour  Intake    230 ml  Output      0 ml  Net    230 ml   Vitals reviewed.  General: Propped up in bed  HEENT: PERRL, EOMI  Cardiac: Tachycardic  Pulm: Course, shallow breaths  Abd: Soft, diffusely TTP, ecchymoses from subcutaneous heparin injections  Ext: Warm and well perfused, no pedal edema, SCDs in place  Neuro: Disoriented on initial evaluation, but with the team 2hs later he was A&O to person, place, and city.  Lab Results: Basic Metabolic Panel:  Lab 02/07/12 1610 02/06/12 0545  NA 139 137  K 3.7 3.6  CL 99 99  CO2 28 28  GLUCOSE 104* 98  BUN 21 25*  CREATININE 3.42* 3.20*  CALCIUM 9.5 9.5  MG -- --  PHOS 3.1 --   Liver Function Tests:  Lab 02/07/12 0530 02/04/12 0434  AST -- 19  ALT -- 12  ALKPHOS -- 135*  BILITOT -- 0.2*  PROT -- 6.3  ALBUMIN 1.9* 1.6*   CBC:  Lab 02/07/12 0530 02/06/12 0545  WBC 4.5 7.0  NEUTROABS -- --  HGB 8.1* 8.3*  HCT 24.9* 26.3*  MCV 98.4 98.9  PLT 169 167  BNP:  Lab 02/04/12 0433  PROBNP 30359.0*   CBG:  Lab 02/06/12 1640 02/05/12 0821 02/04/12 2320 02/04/12 1652 02/04/12 0801 02/03/12 2330  GLUCAP 97 101* 94 96 110* 114*    Urine Drug Screen: Drugs of Abuse     Component Value Date/Time   LABOPIA NONE DETECTED 12/20/2011 0026   COCAINSCRNUR NONE DETECTED 12/20/2011 0026   COCAINSCRNUR NEG 09/27/2010 1551   LABBENZ POSITIVE* 12/20/2011 0026   LABBENZ NEG 09/27/2010 1551   AMPHETMU NONE DETECTED 12/20/2011 0026   AMPHETMU NEG 09/27/2010 1551   THCU NONE  DETECTED 12/20/2011 0026   LABBARB NONE DETECTED 12/20/2011 0026     Micro Results: Recent Results (from the past 240 hour(s))  CULTURE, RESPIRATORY     Status: Normal   Collection Time   01/29/12 12:06 AM      Component Value Range Status Comment   Specimen Description TRACHEAL ASPIRATE   Final    Special Requests NONE   Final    Gram Stain     Final    Value: NO WBC SEEN     NO SQUAMOUS EPITHELIAL CELLS SEEN     FEW GRAM POSITIVE COCCI IN PAIRS   Culture FEW PSEUDOMONAS AERUGINOSA   Final    Report Status 01/31/2012 FINAL   Final    Organism ID, Bacteria PSEUDOMONAS AERUGINOSA   Final   CLOSTRIDIUM DIFFICILE BY PCR     Status: Abnormal   Collection Time   01/30/12  4:00 AM      Component Value Range Status Comment   C difficile by pcr POSITIVE (*) NEGATIVE Final   WOUND CULTURE     Status:  Normal   Collection Time   01/30/12 12:55 PM      Component Value Range Status Comment   Specimen Description WOUND RIGHT LEG   Final    Special Requests NONE   Final    Gram Stain     Final    Value: NO WBC SEEN     NO SQUAMOUS EPITHELIAL CELLS SEEN     NO ORGANISMS SEEN   Culture NO GROWTH 2 DAYS   Final    Report Status 02/01/2012 FINAL   Final   CULTURE, BLOOD (SINGLE)     Status: Normal   Collection Time   01/30/12  9:35 PM      Component Value Range Status Comment   Specimen Description BLOOD CENTRAL LINE   Final    Special Requests BOTTLES DRAWN AEROBIC AND ANAEROBIC 10CC   Final    Culture  Setup Time 01/31/2012 03:48   Final    Culture NO GROWTH 5 DAYS   Final    Report Status 02/06/2012 FINAL   Final    Medications: I have reviewed the patient's current medications. Scheduled Meds:    . albuterol  2.5 mg Nebulization Q6H  . antiseptic oral rinse  15 mL Mouth Rinse BID  . ceFEPime (MAXIPIME) IV  1 g Intravenous Q24H  . clonazePAM  1 mg Oral TID  . cloNIDine  0.3 mg Transdermal Weekly  . famotidine  20 mg Oral Daily  . fentaNYL  150 mcg Transdermal Q72H  . ipratropium   0.5 mg Nebulization Q6H  . lisinopril  10 mg Oral Daily  . multivitamin  1 tablet Oral QHS  . mupirocin cream  1 application Topical BID  . vancomycin  500 mg Oral Q6H  . DISCONTD: LORazepam  2 mg Oral Q6H   Continuous Infusions:  PRN Meds:.sodium chloride, acetaminophen, albuterol, heparin, heparin, heparin, heparin, heparin, heparin, lidocaine, lidocaine-prilocaine, LORazepam, ondansetron (ZOFRAN) IV, pentafluoroprop-tetrafluoroeth  Assessment/Plan: 56 yr. Old WM w/ hx HTN, COPD, Hepatitis C, polysubstance abuse, alcohol abuse, chronic pancreatitis, PUD, ESRD on HD M/W/F, admitted s/p PEA, with polymicrobial bacteremia, sepsis, C diff colitis.  1. PEA (Pulseless electrical activity) - Found down at home by EMS and s/p resuscitation and hypothermia protocol. Likely induced by hyperkalemia from missing dialysis sessions. Now with broken ribs from CPR.  2. End stage renal disease on dialysis - Normally MWF but off schedule. Last dialysis 7/4. Dialysis tomorrow per Nephrology.  3. Acute respiratory failure with hypoxia - Resolved and off vent succesfully. On 2-3L [place]. - Continue to monitor closely.   4. Alcohol abuse - Clonazepam scheduled, Lorazepam changed to PRN. - Continue meds.    5. Delirium - Multifactorial, secondary to infection and hx EtOH abuse. Resolving.  - Will wean off ativan and clonazepam as able. Ativan changed to PRN instead of scheduled.  6. Hyperkalemia - From missing dialysis- getting regular dialysis now and resolved. K 3.7 today.   7. Dysphasia - Difficulty with liquids per Speech Therapy. Passed Modified Barium Swallow study today with Speech, on diet. - Continue diet.  8. Pseudomonas PNA/sepsis/polymicrobial becteremia - Blood culture drawn 6/23 did grow pseudomonas and this was in tracheal aspirate as well. He did also grow some group B strep from his hemodialysis access and urine. Repeat blood culture on 6/27 was NTD. Currently on Day 12 of therapy. He does  need an additional 2 days of cefepime therapy to complete 14 day treatment for pseudomonas pneumonia and polymicrobial bacteremia.  - Continue Cefepime x  14days total  9. C. difficile colitis -  - Continue Vancomycin x15 days tot. (day 9/15), per ID. Shceduled to end 7/11 @1200 .   10. Anemia of Chronic Disease - Acute blood loss anemia on top of chronic disease anemia. Received one unit of blood with dialysis during this stay. No current blood loss noted. CBCs have been stable the last several days.  - F/u am CBC   11. Thrombocytopenia - chronic from end stage organ dysfunction, likely acutely worsened due to sepsis and bacteremia. Platelets 16 today.   12. DVT ppx - SCDs    LOS: 12 days   Genelle Gather 02/07/2012, 2:09 PM

## 2012-02-07 NOTE — Progress Notes (Signed)
Internal Medicine Attending  Date: 02/07/2012  Patient name: [name] Medical record number: [id number] Date of birth: 1956/01/11 Age: 56 y.o. Gender: male  I saw and evaluated the patient. I reviewed the resident's note by Dr. Sherrine Maples and I agree with the resident's findings and plans as documented in her progress note, with the exception that the plts this AM were 169K.  When [name] was seen on rounds he was alert and oriented to person and place.  He still notes some pain with deep breaths related to his broken ribs.  His swallowing evaluation was not too bad and his benzo needs seem to be decreasing (which may improve mental status).  I agree with the housestaff's plan to back down on psychoactive medications, complete the 2 week course of antibiotics for his Pseudomonas pneumonia complicated by bacteremia and group B strep from his HD access and urine.  We will also complete his course of oral vancomycin for his C. Diff colitis.  Appreciate Renal's assistance with dialysis.  I suspect he is now stable for transfer to the general medical wards for further care within the next 24 hours.

## 2012-02-07 NOTE — Progress Notes (Signed)
Subjective:  Alert, in bed. Has not been out of bed.  Objective Vital signs in last 24 hours: Filed Vitals:   02/07/12 0400 02/07/12 0426 02/07/12 0803 02/07/12 0828  BP: 153/69  167/96   Pulse: 103  101   Temp:   99.7 F (37.6 C)   TempSrc:   Oral   Resp: 19     Height:      Weight:  70.1 kg (154 lb 8.7 oz)    SpO2: 92%  96% 100%   Weight change: -5.3 kg (-11 lb 11 oz)  Intake/Output Summary (Last 24 hours) at 02/07/12 1123 Last data filed at 02/07/12 0200  Gross per 24 hour  Intake    230 ml  Output   3343 ml  Net  -3113 ml   Labs: Basic Metabolic Panel:  Lab 02/07/12 1914 02/06/12 0545 02/05/12 0406  NA 139 137 138  K 3.7 3.6 4.1  CL 99 99 99  CO2 28 28 28   GLUCOSE 104* 98 96  BUN 21 25* 23  CREATININE 3.42* 3.20* 3.01*  CALCIUM 9.5 9.5 9.5  ALB -- -- --  PHOS 3.1 -- --   Liver Function Tests:  Lab 02/07/12 0530 02/04/12 0434  AST -- 19  ALT -- 12  ALKPHOS -- 135*  BILITOT -- 0.2*  PROT -- 6.3  ALBUMIN 1.9* 1.6*   No results found for this basename: LIPASE:3,AMYLASE:3 in the last 168 hours No results found for this basename: AMMONIA:3 in the last 168 hours CBC:  Lab 02/07/12 0530 02/06/12 0545 02/05/12 0406 02/04/12 0434 02/03/12 0330  WBC 4.5 7.0 10.6* -- --  NEUTROABS -- -- -- -- --  HGB 8.1* 8.3* 8.6* -- --  HCT 24.9* 26.3* 26.5* -- --  MCV 98.4 98.9 98.9 97.0 96.3  PLT 169 167 138* -- --   Cardiac Enzymes: No results found for this basename: CKTOTAL:5,CKMB:5,CKMBINDEX:5,TROPONINI:5 in the last 168 hours CBG:  Lab 02/06/12 1640 02/05/12 0821 02/04/12 2320 02/04/12 1652 02/04/12 0801  GLUCAP 97 101* 94 96 110*    Physical Exam: General: oriented to year and place today Heart: Tachy regular, no rub heard Lungs:rare rhonchi Abdomen: Bs=+, soft , tender epigastric area Extremities: Dialysis Access: edema resolved, patent left upper arm avgg with   Area of errosin upper portion  Outpatient Dialysis: [facility] LUA AVGG. 4 hr, EDW 69.5 kg.  EPO 4600u. Zemplar 1 ug. Nepro x 1. Heparin 2000u. 3.0K/2.25Ca bath. 400/800.  Assessment/Plan  1. PNA/bacteremia/UTI/Cdif- per ID/primary 2. Volume excess/anasarca- resolved.   3. ESRD TTS/[facility]- dialysis tomorrow 4. Alcoholism- per primary 5. Anemia:/ACDz and ABLA= hgb 8.3 Aranesp 60 mcg/wk op epo was 4600/ not on iron with sepsis 6. s/p PEA Arrest 7. HTN- was on fosinopril 10/d clonidine 0.3 bid at home. Getting TTS-3 patch and lisinopril (Rx sub) here 8. Secondary Hyperparathyroidism=Ca=9.5 with alb 1.6  Hold Zemplar fu phos and ca levels/ no binders now.  Vinson Moselle  MD [facility] [phone] pgr    [phone] cell 02/07/2012, 11:23 AM
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
## 2012-02-07 NOTE — Progress Notes (Addendum)
MD paged about pt's consistently high blood pressure; MD can not concerned; gave parameters  to call with SBP > 190 or DBP >100; will continue to monitor closely and update as needed

## 2012-02-07 NOTE — Progress Notes (Signed)
Clinical Social Work-CSW received report pt now on IMTS service line-this CSW very familiar with pt from previous admissions. CSW attempted to visit with pt who displays confusion and is not oriented to person place or time-it is unclear at this time if this is pt new baseline or if there are any long lasting cognitive impairments.  CSW will follow for d/c planning and or psychosocial support PRN- ( Please see initial assessment by L.Judkins-[email or web address] on 01/27/12 for additional details.)  Jodean Lima, [phone]

## 2012-02-07 NOTE — Progress Notes (Signed)
Speech Language Pathology Dysphagia Treatment Patient Details Name: [name] MRN: [id number] DOB: [birth year] Today's Date: 02/07/2012 Time: 6578-4696 SLP Time Calculation (min): 21 min  Assessment / Plan / Recommendation Clinical Impression  Treatment focused on diagnostic-treatment of swallow function.  Skilled observation revealed overt clinical indicators of likely aspiration with thin liquids via delayed, wet coughing which elicited prolonged coughing fits that likely mobilized tracheal/pharyngeal secretions.  Patient appears much more confused (yet cooperative) as compared to my personal treatment session 2 days ago. Concerned about patient's ability to safely tolerate his current diet in light of increased temperatures, increased confusion and clinical risk factors for aspiration (prolonged intubation, vocal cord dysfunction).  Additionally, patient appears deconditioned and was unable to self feed, dropping spoon.  He would likely benefit from skilled PT/OT evaluations.  Will consult with MD regarding completing a MBSS versus a FEES today to determine the safest diet.     Diet Recommendation  Initiate / Change Diet: Dysphagia 1 (puree);Nectar-thick liquid    SLP Plan New goals to be determined pending objective testing   Pertinent Vitals/Pain n/a   Swallowing Goals  SLP Swallowing Goals Swallow Study Goal #3 - Progress: Progressing toward goal  General Temperature Spikes Noted:  (100.5 @0400  7/5) Respiratory Status: Supplemental O2 delivered via (comment) (4 liters n/c) Behavior/Cognition: Alert;Confused;Decreased sustained attention Oral Cavity - Dentition: Edentulous Patient Positioning: Upright in bed  Oral Cavity - Oral Hygiene Does patient have any of the following "at risk" factors?: Oxygen therapy - cannula, mask, simple oxygen devices Patient is AT RISK - Oral Care Protocol followed (see row info): Yes   Dysphagia Treatment Treatment focused on: Skilled  observation of diet tolerance;Facilitation of pharyngeal phase Treatment Methods/Modalities: Skilled observation;Differential diagnosis Patient observed directly with PO's: Yes Type of PO's observed: Dysphagia 3 (soft);Regular;Thin liquids Feeding: Needs assist Liquids provided via: Cup;Straw Pharyngeal Phase Signs & Symptoms: Suspected delayed swallow initiation;Audible swallow;Delayed throat clear;Delayed cough;Other (comment) (wet cough) Type of cueing: Tactile;Verbal Amount of cueing: Total   Myra Rude, M.S.,CCC-SLP Pager [phone] 02/07/2012, 8:00 AM

## 2012-02-07 NOTE — Evaluation (Signed)
Physical Therapy Evaluation Patient Details Name: [name] MRN: [id number] DOB: [birth year] Today's Date: 02/07/2012 Time: 1440-1506 PT Time Calculation (min): 26 min  PT Assessment / Plan / Recommendation Clinical Impression  Patient is a 56 y.o, male admitted PEA arrest and renal failure on hemodialysis.  He presents with decreased activity tolerance, decreased strength, decreased balance and poor awareness and attention limiting independence and safety with mobility.  He will benefit from skilled PT in the acute setting to maximize independence prior to discharge to rehab facility in prep for d/c home.    PT Assessment  Patient needs continued PT services    Follow Up Recommendations  Inpatient Rehab;Supervision/Assistance - 24 hour    Barriers to Discharge        Equipment Recommendations  Defer to next venue    Recommendations for Other Services Rehab consult   Frequency Min 4X/week    Precautions / Restrictions Precautions Precautions: Fall Precaution Comments: Contact Isolation for  C-Diff   Pertinent Vitals/Pain Denies pain; vitals after therapy: HR 99; Sp[O2 96 on 2 L O2       Mobility  Bed Mobility Bed Mobility: Supine to Sit;Sitting - Scoot to [place] of Bed;Sit to Supine;Scooting to [facility] Supine to Sit: HOB elevated;4: Min assist (to lift trunk upright) Sitting - Scoot to Edge of Bed: 3: Mod assist (using pad under patient) Sit to Supine: 2: Max assist;HOB flat Scooting to HOB: 3: Mod assist (sitting at edge of bed to lateral scoot up to HOE) Transfers Transfers: Sit to Stand;Stand to Sit Sit to Stand: 3: Mod assist;From elevated surface;From bed;Without upper extremity assist Stand to Sit: 4: Min assist;With upper extremity assist;To bed Details for Transfer Assistance: attempted stand pivot to recliner, patient unable due to posterior bias with decreased ankle ROM and LE weakness Ambulation/Gait Ambulation/Gait Assistance: Not tested (comment)    Exercises      PT Diagnosis: Abnormality of gait;Generalized weakness  PT Problem List: Decreased strength;Decreased range of motion;Decreased mobility;Decreased balance;Decreased activity tolerance;Decreased safety awareness PT Treatment Interventions: DME instruction;Gait training;Functional mobility training;Balance training;Patient/family education;Therapeutic activities;Therapeutic exercise;Manual techniques   PT Goals Acute Rehab PT Goals PT Goal Formulation: Patient unable to participate in goal setting Time For Goal Achievement: 02/21/12 Potential to Achieve Goals: Fair Pt will go Supine/Side to Sit: with supervision;with rail;with cues (comment type and amount) PT Goal: Supine/Side to Sit - Progress: Goal set today Pt will Sit at [facility] of Bed: with supervision;3-5 min;with unilateral upper extremity support;Other (comment) (during functional activity) PT Goal: Sit at [facility] Of Bed - Progress: Goal set today Pt will go Sit to Supine/Side: with min assist;with rail PT Goal: Sit to Supine/Side - Progress: Goal set today Pt will go Sit to Stand: with supervision PT Goal: Sit to Stand - Progress: Goal set today Pt will go Stand to Sit: with supervision PT Goal: Stand to Sit - Progress: Goal set today Pt will Transfer Bed to Chair/Chair to Bed: with min assist PT Transfer Goal: Bed to Chair/Chair to Bed - Progress: Goal set today Pt will Stand: with supervision;with bilateral upper extremity support;1 - 2 min;with cues (comment type and amount) PT Goal: Stand - Progress: Goal set today Pt will Ambulate: 16 - 50 feet;with min assist;with rolling walker PT Goal: Ambulate - Progress: Goal set today  Visit Information  Last PT Received On: 02/07/12 Assistance Needed: +2 (for ambulation)    Subjective Data  Subjective: Cal you help me call "Becky?" Patient Stated Goal: None stated (patient confused)  Prior Functioning  Home Living Lives With: Son (step-son per pt report) Available Help at  Discharge: Family;Available PRN/intermittently Type of Home: House Home Access: Other (comment) (unknown: pt did not answer when asked about home entry.) Home Layout: Two level;Bed/bath upstairs Alternate Level Stairs-Number of Steps: flight  Alternate Level Stairs-Rails: Right Bathroom Shower/Tub: Walk-in shower Home Adaptive Equipment: Shower chair with back;Walker - rolling Prior Function Level of Independence: Needs assistance Needs Assistance: Meal Prep;Light Housekeeping Able to Take Stairs?: Yes Driving: No Comments: patient not reliable historian; does report history of falls  Communication Communication: No difficulties    Cognition  Overall Cognitive Status: Impaired Area of Impairment: Attention;Safety/judgement;Awareness of deficits Arousal/Alertness: Awake/alert Orientation Level: Appears intact for tasks assessed Behavior During Session: [facility] for tasks performed Current Attention Level: Sustained Attention - Other Comments: Although able to answer orientation questions, later in session begins to ask where family members are and did not remember he was not at home. Safety/Judgement: Decreased awareness of need for assistance Safety/Judgement - Other Comments: felt he could maintain balance at edge of bed unassisted, then with LOB posterior needing assist to recover    Extremity/Trunk Assessment Right Lower Extremity Assessment RLE ROM/Strength/Tone: Deficits RLE ROM/Strength/Tone Deficits: hip flexion 2+/5, knee extension 3+/5, ankle dorsiflexion 2-/5; ankle DF PROM approx -20 degrees from neutral Left Lower Extremity Assessment LLE ROM/Strength/Tone: Deficits LLE ROM/Strength/Tone Deficits: hip flexion 2+/5, knee extension 3+/5, ankle dorsiflexion 2-/5;  ankle DF PROM approx -20 degrees from neutral Trunk Assessment Trunk Assessment: Kyphotic   Balance Balance Balance Assessed: Yes Static Sitting Balance Static Sitting - Balance Support: Bilateral upper extremity  supported;Feet supported Static Sitting - Level of Assistance: 5: Stand by assistance Static Sitting - Comment/# of Minutes: sat briefly with supervision and cues, then when lifting feet for donning socks LOB backwards with assist to recover. Static Standing Balance Static Standing - Balance Support: Bilateral upper extremity supported Static Standing - Level of Assistance: 1: +2 Total assist;Patient percentage (comment) (30-40%) Static Standing - Comment/# of Minutes: leans back in standing needing assist to prevent posterior LOB.  End of Session PT - End of Session Equipment Utilized During Treatment: Gait belt Activity Tolerance: Patient limited by fatigue Patient left: in bed;with call bell/phone within reach;with family/visitor present;with nursing in room Nurse Communication: Mobility status  GP     [facility] 02/07/2012, 4:29 PM

## 2012-02-08 ENCOUNTER — Inpatient Hospital Stay (HOSPITAL_COMMUNITY): Payer: Medicare Other

## 2012-02-08 DIAGNOSIS — E40 Kwashiorkor: Secondary | ICD-10-CM

## 2012-02-08 LAB — POCT I-STAT 3, ART BLOOD GAS (G3+)
Acid-Base Excess: 5 mmol/L — ABNORMAL HIGH (ref 0.0–2.0)
O2 Saturation: 89 %
O2 Saturation: 96 %
Patient temperature: 98.5
TCO2: 31 mmol/L (ref 0–100)
TCO2: 31 mmol/L (ref 0–100)
pCO2 arterial: 39.5 mmHg (ref 35.0–45.0)
pCO2 arterial: 39 mmHg (ref 35.0–45.0)

## 2012-02-08 LAB — CBC
Hemoglobin: 8.5 g/dL — ABNORMAL LOW (ref 13.0–17.0)
MCH: 32.4 pg (ref 26.0–34.0)
MCHC: 32.9 g/dL (ref 30.0–36.0)
MCV: 98.5 fL (ref 78.0–100.0)

## 2012-02-08 LAB — RENAL FUNCTION PANEL
BUN: 15 mg/dL (ref 6–23)
CO2: 27 mEq/L (ref 19–32)
Calcium: 9.2 mg/dL (ref 8.4–10.5)
GFR calc Af Amer: 25 mL/min — ABNORMAL LOW (ref >90)
Glucose, Bld: 113 mg/dL — ABNORMAL HIGH (ref 70–99)
Phosphorus: 3.8 mg/dL (ref 2.3–4.6)
Sodium: 137 mEq/L (ref 135–145)

## 2012-02-08 LAB — BASIC METABOLIC PANEL
BUN: 32 mg/dL — ABNORMAL HIGH (ref 6–23)
CO2: 25 mEq/L (ref 19–32)
Calcium: 9.6 mg/dL (ref 8.4–10.5)
Creatinine, Ser: 5.18 mg/dL — ABNORMAL HIGH (ref 0.50–1.35)
GFR calc non Af Amer: 11 mL/min — ABNORMAL LOW (ref >90)
Glucose, Bld: 97 mg/dL (ref 70–99)
Sodium: 139 mEq/L (ref 135–145)

## 2012-02-08 LAB — GLUCOSE, CAPILLARY: Glucose-Capillary: 113 mg/dL — ABNORMAL HIGH (ref 70–99)

## 2012-02-08 MED ORDER — MIDAZOLAM HCL 2 MG/2ML IJ SOLN
INTRAMUSCULAR | Status: AC
  Administered 2012-02-08: 2 mg
  Filled 2012-02-08: qty 4

## 2012-02-08 MED ORDER — ROCURONIUM BROMIDE 50 MG/5ML IV SOLN
INTRAVENOUS | Status: AC
  Administered 2012-02-08: 50 mg
  Filled 2012-02-08: qty 2

## 2012-02-08 MED ORDER — METOPROLOL TARTRATE 1 MG/ML IV SOLN
2.5000 mg | Freq: Two times a day (BID) | INTRAVENOUS | Status: DC
  Administered 2012-02-08: 2.5 mg via INTRAVENOUS

## 2012-02-08 MED ORDER — SODIUM CHLORIDE 0.9 % IV SOLN
50.0000 ug/h | INTRAVENOUS | Status: DC
  Administered 2012-02-08 – 2012-02-10 (×3): 100 ug/h via INTRAVENOUS
  Administered 2012-02-11 – 2012-02-12 (×2): 125 ug/h via INTRAVENOUS
  Filled 2012-02-08 (×5): qty 50

## 2012-02-08 MED ORDER — ETOMIDATE 2 MG/ML IV SOLN
INTRAVENOUS | Status: AC
  Administered 2012-02-08: 20 mg
  Filled 2012-02-08: qty 20

## 2012-02-08 MED ORDER — LORAZEPAM 1 MG PO TABS
1.0000 mg | ORAL_TABLET | ORAL | Status: DC
  Administered 2012-02-08 – 2012-02-09 (×3): 1 mg via ORAL
  Filled 2012-02-08 (×3): qty 1

## 2012-02-08 MED ORDER — FENTANYL BOLUS VIA INFUSION
50.0000 ug | Freq: Four times a day (QID) | INTRAVENOUS | Status: DC | PRN
  Administered 2012-02-11: 50 ug via INTRAVENOUS
  Filled 2012-02-08: qty 100

## 2012-02-08 MED ORDER — HYDRALAZINE HCL 20 MG/ML IJ SOLN: 5.0000 mg | INTRAMUSCULAR | Status: DC | PRN

## 2012-02-08 MED ORDER — LORAZEPAM 2 MG/ML IJ SOLN
2.0000 mg | Freq: Once | INTRAMUSCULAR | Status: AC
  Administered 2012-02-08: 2 mg via INTRAVENOUS

## 2012-02-08 MED ORDER — PROPOFOL 10 MG/ML IV EMUL
5.0000 ug/kg/min | INTRAVENOUS | Status: DC
  Administered 2012-02-08: 30 ug/kg/min via INTRAVENOUS
  Administered 2012-02-09: 30.071 ug/kg/min via INTRAVENOUS
  Administered 2012-02-09: 30 ug/kg/min via INTRAVENOUS
  Administered 2012-02-10: 40 ug/kg/min via INTRAVENOUS
  Administered 2012-02-10: 30 ug/kg/min via INTRAVENOUS
  Administered 2012-02-11: 40 ug/kg/min via INTRAVENOUS
  Administered 2012-02-11: 35 ug/kg/min via INTRAVENOUS
  Administered 2012-02-11: 40 ug/kg/min via INTRAVENOUS
  Administered 2012-02-11 – 2012-02-12 (×2): 101.937 ug/kg/min via INTRAVENOUS
  Filled 2012-02-08 (×12): qty 100

## 2012-02-08 MED ORDER — DEXTROSE-NACL 5-0.45 % IV SOLN
INTRAVENOUS | Status: DC
  Administered 2012-02-08: 50 mL/h via INTRAVENOUS
  Administered 2012-02-09 – 2012-02-11 (×2): via INTRAVENOUS

## 2012-02-08 MED ORDER — LABETALOL HCL 5 MG/ML IV SOLN
10.0000 mg | INTRAVENOUS | Status: DC | PRN
  Administered 2012-02-12 – 2012-02-14 (×9): 10 mg via INTRAVENOUS
  Filled 2012-02-08 (×7): qty 4

## 2012-02-08 MED ORDER — LORAZEPAM 2 MG/ML IJ SOLN
INTRAMUSCULAR | Status: AC
  Administered 2012-02-08: 2 mg via INTRAVENOUS
  Filled 2012-02-08: qty 1

## 2012-02-08 MED ORDER — BIOTENE DRY MOUTH MT LIQD
15.0000 mL | Freq: Four times a day (QID) | OROMUCOSAL | Status: DC
  Administered 2012-02-09 – 2012-02-12 (×16): 15 mL via OROMUCOSAL

## 2012-02-08 MED ORDER — DEXTROSE 5 % IV SOLN
2.0000 g | INTRAVENOUS | Status: DC
  Administered 2012-02-08: 2 g via INTRAVENOUS
  Filled 2012-02-08 (×2): qty 2

## 2012-02-08 MED ORDER — CHLORHEXIDINE GLUCONATE 0.12 % MT SOLN
15.0000 mL | Freq: Two times a day (BID) | OROMUCOSAL | Status: DC
  Administered 2012-02-08 – 2012-02-12 (×8): 15 mL via OROMUCOSAL
  Filled 2012-02-08 (×9): qty 15

## 2012-02-08 MED ORDER — IPRATROPIUM BROMIDE 0.02 % IN SOLN
0.5000 mg | Freq: Four times a day (QID) | RESPIRATORY_TRACT | Status: DC | PRN
  Administered 2012-02-13: 0.5 mg via RESPIRATORY_TRACT
  Filled 2012-02-08: qty 2.5

## 2012-02-08 MED ORDER — FENTANYL CITRATE 0.05 MG/ML IJ SOLN
INTRAMUSCULAR | Status: AC
  Administered 2012-02-08: 100 ug
  Filled 2012-02-08: qty 4

## 2012-02-08 MED ORDER — LIDOCAINE HCL (CARDIAC) 20 MG/ML IV SOLN
INTRAVENOUS | Status: AC
  Filled 2012-02-08: qty 5

## 2012-02-08 MED ORDER — SUCCINYLCHOLINE CHLORIDE 20 MG/ML IJ SOLN
INTRAMUSCULAR | Status: AC
  Filled 2012-02-08: qty 10

## 2012-02-08 MED ORDER — PROPOFOL 10 MG/ML IV EMUL
INTRAVENOUS | Status: AC
  Filled 2012-02-08: qty 100

## 2012-02-08 MED ORDER — METOPROLOL TARTRATE 1 MG/ML IV SOLN
INTRAVENOUS | Status: AC
  Administered 2012-02-08: 2.5 mg via INTRAVENOUS
  Filled 2012-02-08: qty 5

## 2012-02-08 MED ORDER — ALBUTEROL SULFATE (5 MG/ML) 0.5% IN NEBU
2.5000 mg | INHALATION_SOLUTION | Freq: Four times a day (QID) | RESPIRATORY_TRACT | Status: DC | PRN
  Administered 2012-02-13 – 2012-02-19 (×2): 2.5 mg via RESPIRATORY_TRACT
  Filled 2012-02-08 (×4): qty 0.5

## 2012-02-08 NOTE — Procedures (Signed)
Intubation Procedure Note JACAI KIPP [id number] [birth year]  Procedure: Intubation Indications: Respiratory insufficiency  Procedure Details Consent: Risks of procedure as well as the alternatives and risks of each were explained to the (patient/caregiver).  Consent for procedure obtained. Time Out: Verified patient identification, verified procedure, site/side was marked, verified correct patient position, special equipment/implants available, medications/allergies/relevent history reviewed, required imaging and test results available.  Performed  Maximum sterile technique was used including cap, gloves, gown, hand hygiene, mask and sheet.  MAC    Evaluation Hemodynamic Status: BP stable throughout; O2 sats: stable throughout Patient's Current Condition: stable Complications: No apparent complications Patient did tolerate procedure well. Chest X-ray ordered to verify placement.  CXR: pending.   Arthuro Canelo, Rudy Jew 02/08/2012

## 2012-02-08 NOTE — Progress Notes (Signed)
Patient returned from dialysis.  Assessment reveals patient in respiratory distress as evidenced by tachypnea, suprasternal retractions, and rhonchi/rales audible throughout lung fields.  Pt. Also noted to be tachycardic and hypertensive.  Level of consciousness decreased compared to initial am assessment today.  Internal medicine MD paged and requested to assess patient.  RT notified of patient's respiratory distress.

## 2012-02-08 NOTE — Progress Notes (Signed)
Subjective:  Drowsy, on dialysis, disruptive occasionally, raising his arms. .  Objective Vital signs in last 24 hours: Filed Vitals:   02/08/12 0500 02/08/12 0600 02/08/12 0735 02/08/12 0805  BP: 186/95 186/95 178/86   Pulse: 102 101 103   Temp:   99.2 F (37.3 C)   TempSrc:   Oral   Resp: 24 24 26    Height:      Weight:      SpO2: 96% 100% 92% 90%   Weight change:   Intake/Output Summary (Last 24 hours) at 02/08/12 0959 Last data filed at 02/08/12 0600  Gross per 24 hour  Intake    216 ml  Output    200 ml  Net     16 ml   Labs: Basic Metabolic Panel:  Lab 02/08/12 1610 02/07/12 0530 02/06/12 0545  NA 139 139 137  K 4.1 3.7 3.6  CL 99 99 99  CO2 25 28 28   GLUCOSE 97 104* 98  BUN 32* 21 25*  CREATININE 5.18* 3.42* 3.20*  CALCIUM 9.6 9.5 9.5  ALB -- -- --  PHOS -- 3.1 --   Liver Function Tests:  Lab 02/07/12 0530 02/04/12 0434  AST -- 19  ALT -- 12  ALKPHOS -- 135*  BILITOT -- 0.2*  PROT -- 6.3  ALBUMIN 1.9* 1.6*   No results found for this basename: LIPASE:3,AMYLASE:3 in the last 168 hours No results found for this basename: AMMONIA:3 in the last 168 hours CBC:  Lab 02/08/12 0558 02/07/12 0530 02/06/12 0545 02/05/12 0406 02/04/12 0434  WBC 4.6 4.5 7.0 -- --  NEUTROABS -- -- -- -- --  HGB 8.5* 8.1* 8.3* -- --  HCT 25.8* 24.9* 26.3* -- --  MCV 98.5 98.4 98.9 98.9 97.0  PLT 198 169 167 -- --   Cardiac Enzymes: No results found for this basename: CKTOTAL:5,CKMB:5,CKMBINDEX:5,TROPONINI:5 in the last 168 hours CBG:  Lab 02/06/12 1640 02/05/12 0821 02/04/12 2320 02/04/12 1652 02/04/12 0801  GLUCAP 97 101* 94 96 110*    Physical Exam: General: oriented to place, not year today, confused Heart: Tachy regular, no rub heard Lungs:rare rhonchi Abdomen: Bs=+, soft , tender epigastric area Extremities: Dialysis Access: edema resolved, patent left upper arm avgg with   Area of erosion upper portion  Outpatient Dialysis: [facility] LUA AVGG. 4 hr, EDW 69.5  kg. EPO 4600u. Zemplar 1 ug. Nepro x 1. Heparin 2000u. 3.0K/2.25Ca bath. 400/800.  Assessment/Plan  1. AMS- waxing and waning. TME, multifactorial 2. Volume excess- better. 70 kg yesterday, dry wt was 69.5kg. HD today, UF as tolerated.    3. 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 PNA/bacteremia/UTI/Cdif- per ID/primary 5. Alcoholism- per primary 6. Anemia:/ACDz and ABLA= hgb 8.3 Aranesp 60 mcg/wk op epo was 4600/ not on iron with sepsis 7. s/p PEA Arrest 8. HTN- was on fosinopril 10/d clonidine 0.3 bid at home. Getting TTS-3 patch and lisinopril (Rx sub) here. BP's high, pull fluid with HD today.  9. Secondary Hyperparathyroidism=Ca=9.5 low albumin,  Hold Zemplar. Low phos 3-4 range, no binders for now.  Vinson Moselle  MD [employer] [phone] pgr    [phone] cell 02/08/2012, 9:59 AM

## 2012-02-08 NOTE — Procedures (Signed)
I was present at this dialysis session. I have reviewed the session itself and made appropriate changes.   Vinson Moselle, MD [employer] 02/08/2012, 2:22 PM

## 2012-02-08 NOTE — Progress Notes (Signed)
Got called for worsening respiratory and Cardiovascular status when patient got back from HD. He was tachypneic ( 30's-40's) , tachycardic ( 120-140's ) and BP in 190-200's /110-120's. Pt denied any pain or distress though he looked in similar distress as in HD- a little worse.  PE: Restless and moving in bed.        Pulm: heavy, shallow breathing, tachypneic, crackles upto mid lungs B/L, no wheezes.        CVS: tachycardic, no murmurs.        Neuro: Altered and restless- little worse than before.  A&P: Likely Alcohol Withdrawal with pulm edema. Although removed 3.8L in HD today. - stat ABG, CXR, Renal panel, CE x1 and EKG. EKG- sinus tachy with peaked T waves in V3-5. ABG- Ph- 7.48, PCO2- 39 and PO2- 53- on 6 L O2. - Started on venti mask per RT. - PCCM consulted for help with management. - Discussed with Dr. Kem Kays.

## 2012-02-08 NOTE — Progress Notes (Signed)
ANTIBIOTIC CONSULT NOTE - FOLLOW UP  Pharmacy Consult for cefepime Indication: pseudomonas pneumonia/bacterermia and group B strep bacteremia  Allergies  Allergen Reactions  . Penicillins     "childhood reaction"    Patient Measurements: Height: 6' (182.9 cm) Weight: 154 lb 8.7 oz (70.1 kg) IBW/kg (Calculated) : 77.6    Vital Signs: Temp: 99.2 F (37.3 C) (07/06 0735) Temp src: Oral (07/06 0735) BP: 178/86 mmHg (07/06 0735) Pulse Rate: 103  (07/06 0735) Intake/Output from previous day: 07/05 0701 - 07/06 0700 In: 216 [P.O.:120; IV Piggyback:56] Out: 200 [Stool:200] Intake/Output from this shift:    Labs:  Basename 02/08/12 0558 02/07/12 0530 02/06/12 0545  WBC 4.6 4.5 7.0  HGB 8.5* 8.1* 8.3*  PLT 198 169 167  LABCREA -- -- --  CREATININE 5.18* 3.42* 3.20*   Estimated Creatinine Clearance: 15.6 ml/min (by C-G formula based on Cr of 5.18). No results found for this basename: VANCOTROUGH:2,VANCOPEAK:2,VANCORANDOM:2,GENTTROUGH:2,GENTPEAK:2,GENTRANDOM:2,TOBRATROUGH:2,TOBRAPEAK:2,TOBRARND:2,AMIKACINPEAK:2,AMIKACINTROU:2,AMIKACIN:2, in the last 72 hours   Microbiology: Recent Results (from the past 720 hour(s))  MRSA PCR SCREENING     Status: Normal   Collection Time   01/11/12  7:46 AM      Component Value Range Status Comment   MRSA by PCR NEGATIVE  NEGATIVE Final   CULTURE, BLOOD (ROUTINE X 2)     Status: Normal   Collection Time   01/26/12  8:50 PM      Component Value Range Status Comment   Specimen Description BLOOD   Final    Special Requests     Final    Value: LEFT IJ BOTTLES DRAWN AEROBIC AND ANAEROBIC 10CC EACH   Culture  Setup Time [id number]   Final    Culture     Final    Value: GROUP B STREP(S.AGALACTIAE)ISOLATED     STAPHYLOCOCCUS SPECIES (COAGULASE NEGATIVE)     Note: THE SIGNIFICANCE OF ISOLATING THIS ORGANISM FROM A SINGLE SET OF BLOOD CULTURES WHEN MULTIPLE SETS ARE DRAWN IS UNCERTAIN. PLEASE NOTIFY THE MICROBIOLOGY DEPARTMENT WITHIN ONE WEEK IF  SPECIATION AND SENSITIVITIES ARE REQUIRED.     Note: Gram Stain Report Called to,Read Back By and Verified With: LESLIE WILSON @ 1444 01/27/12 BY KRAWS   Report Status 01/29/2012 FINAL   Final    Organism ID, Bacteria GROUP B STREP(S.AGALACTIAE)ISOLATED   Final   URINE CULTURE     Status: Normal   Collection Time   01/26/12  9:39 PM      Component Value Range Status Comment   Specimen Description URINE, CATHETERIZED   Final    Special Requests Normal   Final    Culture  Setup Time [id number]   Final    Colony Count >=100,000 COLONIES/ML   Final    Culture     Final    Value: GROUP B STREP(S.AGALACTIAE)ISOLATED     Note: TESTING AGAINST S. AGALACTIAE NOT ROUTINELY PERFORMED DUE TO PREDICTABILITY OF AMP/PEN/VAN SUSCEPTIBILITY.   Report Status 01/28/2012 FINAL   Final   CULTURE, BLOOD (ROUTINE X 2)     Status: Normal   Collection Time   01/26/12 10:06 PM      Component Value Range Status Comment   Specimen Description BLOOD RIGHT RADIAL A-LINE DRAW   Final    Special Requests BOTTLES DRAWN AEROBIC AND ANAEROBIC [facility]   Final    Culture  Setup Time [id number]   Final    Culture     Final    Value: PSEUDOMONAS AERUGINOSA     Note: Gram Stain  Report Called to,Read Back By and Verified With: ROSE COLLOM @ 2150 ON 01/27/12 BY GOLLD   Report Status 01/30/2012 FINAL   Final    Organism ID, Bacteria PSEUDOMONAS AERUGINOSA   Final   MRSA PCR SCREENING     Status: Normal   Collection Time   01/26/12 10:48 PM      Component Value Range Status Comment   MRSA by PCR NEGATIVE  NEGATIVE Final   CULTURE, RESPIRATORY     Status: Normal   Collection Time   01/29/12 12:06 AM      Component Value Range Status Comment   Specimen Description TRACHEAL ASPIRATE   Final    Special Requests NONE   Final    Gram Stain     Final    Value: NO WBC SEEN     NO SQUAMOUS EPITHELIAL CELLS SEEN     FEW GRAM POSITIVE COCCI IN PAIRS   Culture FEW PSEUDOMONAS AERUGINOSA   Final    Report Status 01/31/2012 FINAL    Final    Organism ID, Bacteria PSEUDOMONAS AERUGINOSA   Final   CLOSTRIDIUM DIFFICILE BY PCR     Status: Abnormal   Collection Time   01/30/12  4:00 AM      Component Value Range Status Comment   C difficile by pcr POSITIVE (*) NEGATIVE Final   WOUND CULTURE     Status: Normal   Collection Time   01/30/12 12:55 PM      Component Value Range Status Comment   Specimen Description WOUND RIGHT LEG   Final    Special Requests NONE   Final    Gram Stain     Final    Value: NO WBC SEEN     NO SQUAMOUS EPITHELIAL CELLS SEEN     NO ORGANISMS SEEN   Culture NO GROWTH 2 DAYS   Final    Report Status 02/01/2012 FINAL   Final   CULTURE, BLOOD (SINGLE)     Status: Normal   Collection Time   01/30/12  9:35 PM      Component Value Range Status Comment   Specimen Description BLOOD CENTRAL LINE   Final    Special Requests BOTTLES DRAWN AEROBIC AND ANAEROBIC 10CC   Final    Culture  Setup Time 01/31/2012 03:48   Final    Culture NO GROWTH 5 DAYS   Final    Report Status 02/06/2012 FINAL   Final     Anti-infectives     Start     Dose/Rate Route Frequency Ordered Stop   02/05/12 1000   ceFEPIme (MAXIPIME) 1 g in dextrose 5 % 50 mL IVPB        1 g 100 mL/hr over 30 Minutes Intravenous Every 24 hours 02/05/12 0842     01/31/12 1200   ceFEPIme (MAXIPIME) 2 g in dextrose 5 % 50 mL IVPB  Status:  Discontinued        2 g 100 mL/hr over 30 Minutes Intravenous Every M-W-F (Hemodialysis) 01/30/12 1736 02/05/12 0842   01/31/12 1000   ciprofloxacin (CIPRO) IVPB 400 mg  Status:  Discontinued        400 mg 200 mL/hr over 60 Minutes Intravenous Every 24 hours 01/30/12 1006 01/30/12 1705   01/30/12 1800   ceFEPIme (MAXIPIME) 1 g in dextrose 5 % 50 mL IVPB        1 g 100 mL/hr over 30 Minutes Intravenous NOW 01/30/12 1736 01/30/12 2020   01/30/12 1730  ceFEPIme (MAXIPIME) 2 g in dextrose 5 % 50 mL IVPB  Status:  Discontinued        2 g 100 mL/hr over 30 Minutes Intravenous 3 times per day 01/30/12 1724  01/30/12 1732   01/30/12 1200   vancomycin (VANCOCIN) 50 mg/mL oral solution 500 mg        500 mg Oral 4 times per day 01/30/12 1001 02/13/12 1159   01/30/12 1100   metroNIDAZOLE (FLAGYL) IVPB 500 mg  Status:  Discontinued        500 mg 100 mL/hr over 60 Minutes Intravenous Every 8 hours 01/30/12 1001 02/05/12 0926   01/30/12 1015   ciprofloxacin (CIPRO) IVPB 400 mg        400 mg 200 mL/hr over 60 Minutes Intravenous NOW 01/30/12 1006 01/30/12 1251   01/29/12 1200   vancomycin (VANCOCIN) 750 mg in sodium chloride 0.9 % 150 mL IVPB  Status:  Discontinued        750 mg 150 mL/hr over 60 Minutes Intravenous Every M-W-F (Hemodialysis) 01/29/12 0615 01/29/12 0755   01/28/12 2308   ciprofloxacin (CIPRO) IVPB 400 mg  Status:  Discontinued        400 mg 200 mL/hr over 60 Minutes Intravenous Every 24 hours 01/28/12 0941 01/29/12 0755   01/27/12 2230   ciprofloxacin (CIPRO) IVPB 400 mg  Status:  Discontinued        400 mg 200 mL/hr over 60 Minutes Intravenous Every 12 hours 01/27/12 2209 01/28/12 0941   01/26/12 2300   vancomycin (VANCOCIN) 1,500 mg in sodium chloride 0.9 % 500 mL IVPB        1,500 mg 250 mL/hr over 120 Minutes Intravenous  Once 01/26/12 2120 01/27/12 0226   01/26/12 2200   piperacillin-tazobactam (ZOSYN) IVPB 2.25 g  Status:  Discontinued        2.25 g 100 mL/hr over 30 Minutes Intravenous 3 times per day 01/26/12 2118 01/30/12 1705          Assessment: Patient is a 56 y.o with cefepime started on 6/28 for pseudomonas PNA/bacteriemia and Group B strep bacteremia.  Plan to treat with abx for 14 days per ID.  Today is cefepime day #9 of planned 14 day course.  Pt had been going to HD more often than TTS, so cefepime was changed to 1g daily.  No further extra HD scheduled at this point.   Plan:  1) Change cefepime back to 2g IV after each HD session. 2) Will follow-up for d/c of cefepime after 14 days completed (7/11).  Reece Leader, Pharm D 02/08/2012 9:15  AM

## 2012-02-08 NOTE — Procedures (Signed)
Central Venous Catheter Insertion Procedure Note [name] [id number] 1955/12/30  Procedure: Insertion of Central Venous Catheter Indications: Drug and/or fluid administration  Procedure Details Consent: Unable to obtain consent because of emergent medical necessity. Time Out: Verified patient identification, verified procedure, site/side was marked, verified correct patient position, special equipment/implants available, medications/allergies/relevent history reviewed, required imaging and test results available.  Performed  Maximum sterile technique was used including antiseptics, cap, gloves, gown, hand hygiene, mask and sheet. Skin prep: Chlorhexidine; local anesthetic administered A antimicrobial bonded/coated triple lumen catheter was placed in the left internal jugular vein using the Seldinger technique. Ultrasound used for guidance.  Evaluation Blood flow good Complications: No apparent complications Patient did tolerate procedure well. Chest X-ray ordered to verify placement.  CXR: normal.  [name] 02/08/2012, 5:48 PM

## 2012-02-08 NOTE — Progress Notes (Signed)
Was paged by nurse about pt's HBP and went to evaluate. Pt was alert and oriented to place and self-usual baseline. Did not complain of any HA, CP, SOB, dizziness, blurry vision, pain, anxiety, or any nausea/vomiting. Pt doesn't seem to be in HTN encephalopathy and was asking for alcohol.  Filed Vitals:   02/08/12 0023  BP: 182/97  Pulse: 96  Temp:   Resp: 21   On PE pt did have crackles bilaterally and slightly elevated JVD 4-5cm, no LE pitting edema, RRR, no murmurs, clonidine patch on pts left arm, Pt waiting for dialysis tomorrow and doesn't appear to be withdrawing from alcohol-he just received Ativan before evaluation.  Did not feel that BP needed to be acutely managed did write for prn hydralazine if systolic >200 or diastolic  >120. Pt already receiving clonidine 0.3mg  patch and lisinopril 10mg  for underlying HTN.  Will continue to monitor vitals and mentation.  Thanks.   Christen Bame, PGY1 IM resident Pager: [phone]

## 2012-02-08 NOTE — Progress Notes (Signed)
CRITICAL VALUE ALERT  Critical value received: left lung collapse Date of notification: 02/08/12  Time of notification:  2150 Critical value read back: yes  Nurse who received alert: B.Evonnie Dawes  MD notified (1st page):  Dr Frederico Hamman (E-link) Time of first page:  2152 MD notified (2nd page):  Time of second page:  Responding MD: Dr. Frederico Hamman Time MD responded: 2152

## 2012-02-08 NOTE — Progress Notes (Signed)
Name: [name] MRN: [id number] DOB: March 23, 1956    LOS: 13 Date of admit 01/26/2012  5:09 PM    PULMONARY / CRITICAL CARE MEDICINE  BRIEF  56 year old male with PMH of HTN, COPD, Hep C, polysubstance abuse, chronic recurrent pancreatitis, PUD and ESRD. He initially called EMS from home complaining of chest pain. At arrival EMS found him in PEA arrest. He was intubated in the field, advanced CPR was initiated and return to spontaneous circulation was obtained after 5 to 6 minutes. Initial EKG showed peaked T waves and K+ of 6.4. At arrival was unresponsive to painful stimuli. Patient reportedly had missed dialysis for the week PTA.   KEY SOCIAL HX Patient's sister, [name] [phone]). Patient has brothers as well. Ms. Ellery  Most involved and admitted to patient problems with non compliance and substance abuse   EVENTS 6/25 - increasing pressors, fluid bolus for low bp, hypoglycemic requiring D10 6/26 - agitation, some bleeding from upper airway, diarrhea overnight--> flexiseal 6/28 - Severe agitation requiring increased fent gtt overnight, Transfused 1 u on HD            CT abd  - fracture rt 6-8 ribs, splenomegaly, LLL consolidation 6/28 - precedex/ klonopin added with decreased fent gtt 7/01 - Not weanable due to agitation on WUA 7/2 - improved, extubated 7/3 - Tx to IM TS 7/6 - Hypertensive, tachycardic, respiratory distress, intubated  SUBJECTIVE/OVERNIGHT/INTERVAL HX Tachypnea, tachycardia, HTN.  To HD with 3.5 L removed, remained distressed.    Vital Signs: Temp:  [97.8 F (36.6 C)-99.5 F (37.5 C)] 99.5 F (37.5 C) (07/06 1614) Pulse Rate:  [93-123] 120  (07/06 1340) Resp:  [19-35] 30  (07/06 1340) BP: (163-210)/(86-126) 185/105 mmHg (07/06 1340) SpO2:  [88 %-100 %] 96 % (07/06 1340) Weight:  [144 lb 2.9 oz (65.4 kg)-150 lb 9.2 oz (68.3 kg)] 144 lb 2.9 oz (65.4 kg) (07/06 1340)  Physical Examination: General: chronically ill  Neuro: somnolent, minimal  response to name HEENT:  PERRL, pink conjunctivae, dry membranes.   Neck:  Supple, no JVD   Cardiovascular:  RRR, no M/R/G Lungs: resp's shallow, labored with accessory muscle use, lungs bilaterally diminished on L, coarse on R Abdomen:  soft, mildly distended, diminished bowel sounds but present. Musculoskeletal:  No pedal edema, cyanosis or clubbing. Skin:  No rash. Multiple skin superficial erosions, bruises and crusts.     ASSESSMENT AND PLAN:    Patient Active Problem List  Diagnosis  . SUBSTANCE ABUSE, MULTIPLE  . PEPTIC ULCER DISEASE  . Chronic recurrent pancreatitis  . End stage renal disease on dialysis  . Alcohol abuse  . COPD (chronic obstructive pulmonary disease)  . Neuropathy  . Protein malnutrition  . PEA (Pulseless electrical activity)  . Acute respiratory failure with hypoxia  . Hyperkalemia  . Bacteremia due to Pseudomonas  . Sepsis due to Streptococcus, group B  . C. difficile colitis  . Anemia  . Thrombocytopenia     PULMONARY  Lab 02/08/12 1537  PHART 7.480*  PCO2ART 39.5  PO2ART 53.0*  HCO3 29.4*  O2SAT 89.0    CXR: 7/6 worsened collapse LLL ETT:  7/1>>>7/3            7/6>>>  A:   Respiratory failure secondary to cardiac arrest>>>resolved.  New failure on 7/6 likely related to mucus plugging.  History of COPD Respiratory and metabolic acidosis - resolved LLL Aspiration pna d/t pseudomonas, improved  Fracture rt 6-8 ribs with cpr  P:   -  intubation  -pcxr post line / intubation to confirm placement -hopeful positive pressure will open up LLL, may require bronch -sedation protocol / propofol in setting of HTN -f/u abg  CARDIOVASCULAR  Lab 02/04/12 0433  TROPONINI --  LATICACIDVEN --  PROBNP 30359.0*   ECG:  Peak T waves, Sinus rhythm at admit on 6/23 -> normalized 6/24 Lines: Left IJ CVL 6/24 >> 7/3              A:  PEA arrest. Due to  metabolic derrangements secondary to non compliance with dialysis and sepsis -  septic/  cardiogenic shock --resolved  History of hypertension -BP significantly elevated Mild bump in troponin since admit probably secondary to sepsis.  Recheck enzymes with decompensation 7/6   P:  -continue clonidine, lisinopril -PRN labetalol -assess cardiac enzymes, EKG  RENAL  Lab 02/08/12 0558 02/07/12 0530 02/06/12 0545 02/05/12 0406 02/04/12 0434  NA 139 139 137 138 132*  K 4.1 3.7 -- -- --  CL 99 99 99 99 98  CO2 25 28 28 28 26   BUN 32* 21 25* 23 39*  CREATININE 5.18* 3.42* 3.20* 3.01* 3.78*  CALCIUM 9.6 9.5 9.5 9.5 9.7  MG -- -- -- -- --  PHOS -- 3.1 -- -- --   Intake/Output      07/05 0701 - 07/06 0700 07/06 0701 - 07/07 0700   P.O. 120    Other 40    IV Piggyback 56    Total Intake(mL/kg) 216 (3.1)    Other  3800   Stool 200    Total Output 200 3800   Net +16 -3800         Foley:  6/23 >> out  A:   ESRD on hemodialysis Hyperkalemia, hyponatremia at admit on 01/26/2012. S/p emergent HD on 01/26/12.     P:   -HD per Nephrology (last 7/6 with 3.5 L removed)   GASTROINTESTINAL  Lab 02/07/12 0530 02/04/12 0434  AST -- 19  ALT -- 12  ALKPHOS -- 135*  BILITOT -- 0.2*  PROT -- 6.3  ALBUMIN 1.9* 1.6*    A:  History of PUD, CDIFF Colitis with pos C Diff PCR  P:   -oral vanco -->see ID -H2  -monitor H/H daily  HEMATOLOGIC  Lab 02/08/12 0558 02/07/12 0530 02/06/12 0545 02/05/12 0406 02/04/12 0434  HGB 8.5* 8.1* 8.3* 8.6* 8.6*  HCT 25.8* 24.9* 26.3* 26.5* 26.1*  PLT 198 169 167 138* 98*  INR -- -- -- -- --  APTT -- -- -- -- --   A:  Anemic of critical illness / Thrombocytopenia  Stable, PLT improving  P:  - goal Hb 7 & above   INFECTIOUS  Lab 02/08/12 0558 02/07/12 0530 02/06/12 0545 02/05/12 0406 02/04/12 0434 02/03/12 0330  WBC 4.6 4.5 7.0 10.6* 10.0 --  PROCALCITON -- -- -- -- 3.16 3.23   Cultures:  6/23 urine >> strep agalactiae 6/23 resp >>strep agalactiae 6/23 bld >> pseudomonas, strep agalactiae 6/26 C diff pCR POS 6/27 blood X 2   >> neg  7/6 Sputum >>>   ABx   Pip/ Tazo 6/23 >> 6/27 cipro 6/23 >>6/27 vanc 6/23 >> 6/27 Cefepime 6/27 (pseudomonas, Strep agalactiae)>> PO vanc 6/27 (CDIFF)>> Flagyl 6/27 (CDIFF)>>7/3  A:   Pseudomonas sepsis, d/t LLL PNA d/t pseudomonas History of MRSA but MRSA Pcr negative on admit 01/26/2012 History of Hep C Group B Strep Agalactiae bacteremia C. Diff colitis  P:   -Abx per ID -re-check sputum,  pct   ENDOCRINE  Lab 02/06/12 1640 02/05/12 0821 02/04/12 2320 02/04/12 1652 02/04/12 0801  GLUCAP 97 101* 94 96 110*   A:   Hypoglycemia - resolved  P:   -check CBG's Q6 -add D5 maintenence while NPO  NEUROLOGIC  A:  Encephalopathy - agitated delirium, h/o polysubstance abuse  P:   -Cont Duragesic patch, Klonopin  -propofol for sedation while intubated (HTN'sive)   BEST PRACTICE / DISPOSITION - Level of Care:  ICU - Primary Service:  IMTS - Consultants:  Nephrology, ID - Code Status:  Full code - Diet:  PO - DVT Px:  SCDs - GI Px: H2 - Skin Integrity:  Multiple open ulcerations on legs with crusting - Social / Family: no family 7/6 at bedside, attempted to call family with no response to update on patients decline and needed procedure consent.     [name], NP-C [facility] Pgr: [phone] or [phone]  I have seen and examined the patient with nurse practitioner/resident and agree with the note above.   CC time 45 minutes  Yolonda Kida PCCM Pager: [phone] Cell: [phone] If no response, call [phone]

## 2012-02-08 NOTE — Progress Notes (Signed)
Subjective: Patient in dialysis. Shaking and moaning intermittently. But says he feels fine and has no pain anywhere. Blood pressure in 180s/ 120s, tachycardic in the 120s and 130s, tachypneic - 30's. Although alert and oriented x3.  Objective: Vital signs in last 24 hours: Filed Vitals:   02/08/12 1130 02/08/12 1200 02/08/12 1230 02/08/12 1300  BP: 177/107 210/121 191/104 174/114  Pulse: 120 123 115 117  Temp:      TempSrc:      Resp: 30 34 35 25  Height:      Weight:      SpO2:  89% 90% 98%   Weight change:   Intake/Output Summary (Last 24 hours) at 02/08/12 1411 Last data filed at 02/08/12 0600  Gross per 24 hour  Intake    166 ml  Output    200 ml  Net    -34 ml   Vitals reviewed.  General: Propped up in bed - while getting HD HEENT: PERRL, EOMI  Cardiac: Tachycardic , no murmurs.  Chest wall tenderness to palpation- left parasternal. Pulm: Course, shallow breaths  Abd: Soft, nontender, ecchymoses from subcutaneous heparin injections  Ext: Warm and well perfused, no pedal edema, SCDs in place  Neuro: Alert and oriented x3. Waxing and waning. Shaking and moving restlessly intermittently.  Lab Results: Basic Metabolic Panel:  Lab 02/08/12 4098 02/07/12 0530  NA 139 139  K 4.1 3.7  CL 99 99  CO2 25 28  GLUCOSE 97 104*  BUN 32* 21  CREATININE 5.18* 3.42*  CALCIUM 9.6 9.5  MG -- --  PHOS -- 3.1   Liver Function Tests:  Lab 02/07/12 0530 02/04/12 0434  AST -- 19  ALT -- 12  ALKPHOS -- 135*  BILITOT -- 0.2*  PROT -- 6.3  ALBUMIN 1.9* 1.6*   CBC:  Lab 02/08/12 0558 02/07/12 0530  WBC 4.6 4.5  NEUTROABS -- --  HGB 8.5* 8.1*  HCT 25.8* 24.9*  MCV 98.5 98.4  PLT 198 169  BNP:  Lab 02/04/12 0433  PROBNP 30359.0*   CBG:  Lab 02/06/12 1640 02/05/12 0821 02/04/12 2320 02/04/12 1652 02/04/12 0801 02/03/12 2330  GLUCAP 97 101* 94 96 110* 114*    Urine Drug Screen: Drugs of Abuse     Component Value Date/Time   LABOPIA NONE DETECTED 12/20/2011  0026   COCAINSCRNUR NONE DETECTED 12/20/2011 0026   COCAINSCRNUR NEG 09/27/2010 1551   LABBENZ POSITIVE* 12/20/2011 0026   LABBENZ NEG 09/27/2010 1551   AMPHETMU NONE DETECTED 12/20/2011 0026   AMPHETMU NEG 09/27/2010 1551   THCU NONE DETECTED 12/20/2011 0026   LABBARB NONE DETECTED 12/20/2011 0026     Micro Results: Recent Results (from the past 240 hour(s))  CLOSTRIDIUM DIFFICILE BY PCR     Status: Abnormal   Collection Time   01/30/12  4:00 AM      Component Value Range Status Comment   C difficile by pcr POSITIVE (*) NEGATIVE Final   WOUND CULTURE     Status: Normal   Collection Time   01/30/12 12:55 PM      Component Value Range Status Comment   Specimen Description WOUND RIGHT LEG   Final    Special Requests NONE   Final    Gram Stain     Final    Value: NO WBC SEEN     NO SQUAMOUS EPITHELIAL CELLS SEEN     NO ORGANISMS SEEN   Culture NO GROWTH 2 DAYS   Final    Report  Status 02/01/2012 FINAL   Final   CULTURE, BLOOD (SINGLE)     Status: Normal   Collection Time   01/30/12  9:35 PM      Component Value Range Status Comment   Specimen Description BLOOD CENTRAL LINE   Final    Special Requests BOTTLES DRAWN AEROBIC AND ANAEROBIC 10CC   Final    Culture  Setup Time 01/31/2012 03:48   Final    Culture NO GROWTH 5 DAYS   Final    Report Status 02/06/2012 FINAL   Final    Medications: I have reviewed the patient's current medications. Scheduled Meds:    . antiseptic oral rinse  15 mL Mouth Rinse BID  . ceFEPime (MAXIPIME) IV  2 g Intravenous Q T,Th,Sat-1800  . clonazePAM  1 mg Oral TID  . cloNIDine  0.3 mg Transdermal Weekly  . famotidine  20 mg Oral Daily  . fentaNYL  150 mcg Transdermal Q72H  . lisinopril  10 mg Oral Daily  . LORazepam  2 mg Intravenous Once  . LORazepam  1 mg Oral Q4H  . metoprolol  2.5 mg Intravenous Q12H  . multivitamin  1 tablet Oral QHS  . mupirocin cream  1 application Topical BID  . vancomycin  500 mg Oral Q6H  . DISCONTD: albuterol  2.5 mg  Nebulization Q6H  . DISCONTD: ceFEPime (MAXIPIME) IV  1 g Intravenous Q24H  . DISCONTD: ipratropium  0.5 mg Nebulization Q6H   Continuous Infusions:  PRN Meds:.sodium chloride, acetaminophen, albuterol, heparin, heparin, heparin, heparin, heparin, heparin, hydrALAZINE, HYDROcodone-acetaminophen, ipratropium, lidocaine, lidocaine-prilocaine, ondansetron (ZOFRAN) IV, pentafluoroprop-tetrafluoroeth, DISCONTD: albuterol, DISCONTD: LORazepam  Assessment/Plan: 56 yr. Old WM w/ hx HTN, COPD, Hepatitis C, polysubstance abuse, alcohol abuse, chronic pancreatitis, PUD, ESRD on HD M/W/F, admitted s/p PEA, with polymicrobial bacteremia, sepsis, C diff colitis.  1. PEA (Pulseless electrical activity) - Found down at home by EMS and s/p resuscitation and hypothermia protocol. Likely induced by hyperkalemia from missing dialysis sessions. Now with broken ribs from CPR.  2. End stage renal disease on dialysis - Normally MWF but off schedule. Last dialysis 7/4. Getting Dialysis.  3. Acute respiratory failure with hypoxia - Resolved and off vent succesfully. On 2-3L [place]. - Tachypneic- will getting hemodialysis. Likely from alcohol withdrawal.  - Will get portable chest x-ray and appropriately re-consult PCCM if needed - Continue to monitor closely.   4. Alcohol abuse - Clonazepam scheduled, Lorazepam- changed back to scheduled- 1 mg every 4 hours by mouth- due to increased agitation, severely elevated blood pressure and tachycardia along with tachypnea - Will increase dose of Ativan if needed.  5. Delirium - Multifactorial, secondary to infection and hx EtOH abuse. Resolving.  - Will wean off ativan and clonazepam as able. Ativan changed to PRN instead of scheduled.  6. Hyperkalemia - From missing dialysis- getting regular dialysis now and resolved. K 3.7 today.   7. Dysphasia - Difficulty with liquids per Speech Therapy. Passed Modified Barium Swallow study today with Speech, on diet. - Continue  diet.  8. Pseudomonas PNA/sepsis/polymicrobial becteremia - Blood culture drawn 6/23 did grow pseudomonas and this was in tracheal aspirate as well. He did also grow some group B strep from his hemodialysis access and urine. Repeat blood culture on 6/27 was NTD. Currently on Day 12 of therapy. He does need an additional 2 days of cefepime therapy to complete 14 day treatment for pseudomonas pneumonia and polymicrobial bacteremia.  - Continue Cefepime x 14days total  9. C. difficile colitis -  -  Continue Vancomycin x15 days tot. (day 9/15), per ID. Shceduled to end 7/11 @1200 .   10. Anemia of Chronic Disease - Acute blood loss anemia on top of chronic disease anemia. Received one unit of blood with dialysis during this stay. No current blood loss noted. CBCs have been stable the last several days.  - F/u am CBC - stable   11. Thrombocytopenia - chronic from end stage organ dysfunction, likely acutely worsened due to sepsis and bacteremia. Platelets 198 today.   12. DVT ppx - SCDs    LOS: 13 days   Rajveer Handler 02/08/2012, 2:11 PM

## 2012-02-08 NOTE — Progress Notes (Signed)
MD paged again for consistently high blood pressure; MD came to bedside, orders received for hydralazine; will continue to monitor closely and update as needed

## 2012-02-08 NOTE — Procedures (Signed)
Intubation Procedure Note [name] [id number] Apr 18, 1956  Procedure: Intubation Indications: Respiratory insufficiency  Procedure Details Consent: Unable to obtain consent because of emergent medical necessity. Time Out: Verified patient identification, verified procedure, site/side was marked, verified correct patient position, special equipment/implants available, medications/allergies/relevent history reviewed, required imaging and test results available.  Performed  Drugs: Etomidate 20mg  Rocuronium 50mg  Fentanyl Versed 2mg  DLx1MAC and 4 Grade 1 view, 7-5 ETT passed between cords under direct visualization, confirmed by color change on ET CO2, smoke in tube, bilat breath sounds   Evaluation Hemodynamic Status: BP stable throughout; O2 sats: stable throughout Patient's Current Condition: stable Complications: No apparent complications Patient did tolerate procedure well. Chest X-ray ordered to verify placement.  CXR: pending.   [name] 02/08/2012

## 2012-02-09 ENCOUNTER — Inpatient Hospital Stay (HOSPITAL_COMMUNITY): Payer: Medicare Other

## 2012-02-09 LAB — CARDIAC PANEL(CRET KIN+CKTOT+MB+TROPI)
CK, MB: 1.5 ng/mL (ref 0.3–4.0)
CK, MB: 1.6 ng/mL (ref 0.3–4.0)
CK, MB: 1.9 ng/mL (ref 0.3–4.0)
Relative Index: INVALID (ref 0.0–2.5)
Relative Index: INVALID (ref 0.0–2.5)
Relative Index: INVALID (ref 0.0–2.5)
Total CK: 25 U/L (ref 7–232)
Total CK: 22 U/L (ref 7–232)
Total CK: 16 U/L (ref 7–232)
Total CK: 16 U/L (ref 7–232)
Troponin I: <0.3 ng/mL (ref <0.30)
Troponin I: <0.3 ng/mL (ref <0.30)
Troponin I: <0.3 ng/mL (ref <0.30)

## 2012-02-09 LAB — POCT I-STAT 3, ART BLOOD GAS (G3+)
Acid-Base Excess: 4 mmol/L — ABNORMAL HIGH (ref 0.0–2.0)
O2 Saturation: 92 %

## 2012-02-09 MED ORDER — VANCOMYCIN HCL 1000 MG IV SOLR
1500.0000 mg | Freq: Once | INTRAVENOUS | Status: AC
  Administered 2012-02-09: 1500 mg via INTRAVENOUS
  Filled 2012-02-09: qty 1500

## 2012-02-09 MED ORDER — VANCOMYCIN HCL 1000 MG IV SOLR
750.0000 mg | INTRAVENOUS | Status: DC
  Filled 2012-02-09: qty 750

## 2012-02-09 NOTE — Procedures (Signed)
Bedside Bronchoscopy Procedure Note [name] [id number] [birth year]  Procedure: Bronchoscopy Indications: Obtain specimens for culture and/or other diagnostic studies  Procedure Details: ET Tube Size: ET Tube secured at lip (cm): Bite block in place: Yes In preparation for procedure, Patient hyper-oxygenated with 20 % FiO2 Airway entered and the following bronchi were examined: Bronchi.   Bronchoscope removed.    Evaluation BP 143/77  Pulse 94  Temp 97.9 F (36.6 C) (Axillary)  Resp 22  Ht 6' (1.829 m)  Wt 143 lb 11.8 oz (65.2 kg)  BMI 19.49 kg/m2  SpO2 99% Breath Sounds:Diminished O2 sats: stable throughout Patient's Current Condition: stable Specimens:  Sent purulent fluid Complications: No apparent complications Patient did tolerate procedure well.   Closson, Terie Purser 02/09/2012, 8:49 AM

## 2012-02-09 NOTE — Progress Notes (Signed)
ANTIBIOTIC CONSULT NOTE - FOLLOW UP  Pharmacy Consult for cefepime; add vancomycin today Indication: pseudomonas pneumonia/bacterermia and group B strep bacteremia  Allergies  Allergen Reactions  . Penicillins     "childhood reaction"    Patient Measurements: Height: 6' (182.9 cm) Weight: 143 lb 11.8 oz (65.2 kg) IBW/kg (Calculated) : 77.6    Vital Signs: Temp: 97.9 F (36.6 C) (07/07 0700) Temp src: Axillary (07/07 0700) BP: 143/77 mmHg (07/07 0814) Pulse Rate: 94  (07/07 0814) Intake/Output from previous day: 07/06 0701 - 07/07 0700 In: 799.8 [I.V.:749.8; IV Piggyback:50] Out: 3800  Intake/Output from this shift:    Labs:  Basename 02/08/12 1914 02/08/12 0558 02/07/12 0530  WBC -- 4.6 4.5  HGB -- 8.5* 8.1*  PLT -- 198 169  LABCREA -- -- --  CREATININE 2.99* 5.18* 3.42*   Estimated Creatinine Clearance: 25.1 ml/min (by C-G formula based on Cr of 2.99). No results found for this basename: VANCOTROUGH:2,VANCOPEAK:2,VANCORANDOM:2,GENTTROUGH:2,GENTPEAK:2,GENTRANDOM:2,TOBRATROUGH:2,TOBRAPEAK:2,TOBRARND:2,AMIKACINPEAK:2,AMIKACINTROU:2,AMIKACIN:2, in the last 72 hours   Microbiology: Recent Results (from the past 720 hour(s))  MRSA PCR SCREENING     Status: Normal   Collection Time   01/11/12  7:46 AM      Component Value Range Status Comment   MRSA by PCR NEGATIVE  NEGATIVE Final   CULTURE, BLOOD (ROUTINE X 2)     Status: Normal   Collection Time   01/26/12  8:50 PM      Component Value Range Status Comment   Specimen Description BLOOD   Final    Special Requests     Final    Value: LEFT IJ BOTTLES DRAWN AEROBIC AND ANAEROBIC 10CC EACH   Culture  Setup Time [id number]   Final    Culture     Final    Value: GROUP B STREP(S.AGALACTIAE)ISOLATED     STAPHYLOCOCCUS SPECIES (COAGULASE NEGATIVE)     Note: THE SIGNIFICANCE OF ISOLATING THIS ORGANISM FROM A SINGLE SET OF BLOOD CULTURES WHEN MULTIPLE SETS ARE DRAWN IS UNCERTAIN. PLEASE NOTIFY THE MICROBIOLOGY DEPARTMENT  WITHIN ONE WEEK IF SPECIATION AND SENSITIVITIES ARE REQUIRED.     Note: Gram Stain Report Called to,Read Back By and Verified With: LESLIE WILSON @ 1444 01/27/12 BY KRAWS   Report Status 01/29/2012 FINAL   Final    Organism ID, Bacteria GROUP B STREP(S.AGALACTIAE)ISOLATED   Final   URINE CULTURE     Status: Normal   Collection Time   01/26/12  9:39 PM      Component Value Range Status Comment   Specimen Description URINE, CATHETERIZED   Final    Special Requests Normal   Final    Culture  Setup Time [id number]   Final    Colony Count >=100,000 COLONIES/ML   Final    Culture     Final    Value: GROUP B STREP(S.AGALACTIAE)ISOLATED     Note: TESTING AGAINST S. AGALACTIAE NOT ROUTINELY PERFORMED DUE TO PREDICTABILITY OF AMP/PEN/VAN SUSCEPTIBILITY.   Report Status 01/28/2012 FINAL   Final   CULTURE, BLOOD (ROUTINE X 2)     Status: Normal   Collection Time   01/26/12 10:06 PM      Component Value Range Status Comment   Specimen Description BLOOD RIGHT RADIAL A-LINE DRAW   Final    Special Requests BOTTLES DRAWN AEROBIC AND ANAEROBIC [facility]   Final    Culture  Setup Time [id number]   Final    Culture     Final    Value: PSEUDOMONAS AERUGINOSA  Note: Gram Stain Report Called to,Read Back By and Verified With: ROSE COLLOM @ 2150 ON 01/27/12 BY GOLLD   Report Status 01/30/2012 FINAL   Final    Organism ID, Bacteria PSEUDOMONAS AERUGINOSA   Final   MRSA PCR SCREENING     Status: Normal   Collection Time   01/26/12 10:48 PM      Component Value Range Status Comment   MRSA by PCR NEGATIVE  NEGATIVE Final   CULTURE, RESPIRATORY     Status: Normal   Collection Time   01/29/12 12:06 AM      Component Value Range Status Comment   Specimen Description TRACHEAL ASPIRATE   Final    Special Requests NONE   Final    Gram Stain     Final    Value: NO WBC SEEN     NO SQUAMOUS EPITHELIAL CELLS SEEN     FEW GRAM POSITIVE COCCI IN PAIRS   Culture FEW PSEUDOMONAS AERUGINOSA   Final    Report  Status 01/31/2012 FINAL   Final    Organism ID, Bacteria PSEUDOMONAS AERUGINOSA   Final   CLOSTRIDIUM DIFFICILE BY PCR     Status: Abnormal   Collection Time   01/30/12  4:00 AM      Component Value Range Status Comment   C difficile by pcr POSITIVE (*) NEGATIVE Final   WOUND CULTURE     Status: Normal   Collection Time   01/30/12 12:55 PM      Component Value Range Status Comment   Specimen Description WOUND RIGHT LEG   Final    Special Requests NONE   Final    Gram Stain     Final    Value: NO WBC SEEN     NO SQUAMOUS EPITHELIAL CELLS SEEN     NO ORGANISMS SEEN   Culture NO GROWTH 2 DAYS   Final    Report Status 02/01/2012 FINAL   Final   CULTURE, BLOOD (SINGLE)     Status: Normal   Collection Time   01/30/12  9:35 PM      Component Value Range Status Comment   Specimen Description BLOOD CENTRAL LINE   Final    Special Requests BOTTLES DRAWN AEROBIC AND ANAEROBIC 10CC   Final    Culture  Setup Time 01/31/2012 03:48   Final    Culture NO GROWTH 5 DAYS   Final    Report Status 02/06/2012 FINAL   Final     Anti-infectives     Start     Dose/Rate Route Frequency Ordered Stop   02/11/12 1200   vancomycin (VANCOCIN) 750 mg in sodium chloride 0.9 % 150 mL IVPB        750 mg 150 mL/hr over 60 Minutes Intravenous Every T-Th-Sa (Hemodialysis) 02/09/12 0827     02/09/12 0830   vancomycin (VANCOCIN) 1,500 mg in sodium chloride 0.9 % 500 mL IVPB        1,500 mg 250 mL/hr over 120 Minutes Intravenous  Once 02/09/12 0827     02/08/12 1800   ceFEPIme (MAXIPIME) 2 g in dextrose 5 % 50 mL IVPB        2 g 100 mL/hr over 30 Minutes Intravenous Every T-Th-Sa (1800) 02/08/12 0920     02/05/12 1000   ceFEPIme (MAXIPIME) 1 g in dextrose 5 % 50 mL IVPB  Status:  Discontinued        1 g 100 mL/hr over 30 Minutes Intravenous Every 24 hours 02/05/12  1610 02/08/12 0920   01/31/12 1200   ceFEPIme (MAXIPIME) 2 g in dextrose 5 % 50 mL IVPB  Status:  Discontinued        2 g 100 mL/hr over 30 Minutes  Intravenous Every M-W-F (Hemodialysis) 01/30/12 1736 02/05/12 0842   01/31/12 1000   ciprofloxacin (CIPRO) IVPB 400 mg  Status:  Discontinued        400 mg 200 mL/hr over 60 Minutes Intravenous Every 24 hours 01/30/12 1006 01/30/12 1705   01/30/12 1800   ceFEPIme (MAXIPIME) 1 g in dextrose 5 % 50 mL IVPB        1 g 100 mL/hr over 30 Minutes Intravenous NOW 01/30/12 1736 01/30/12 2020   01/30/12 1730   ceFEPIme (MAXIPIME) 2 g in dextrose 5 % 50 mL IVPB  Status:  Discontinued        2 g 100 mL/hr over 30 Minutes Intravenous 3 times per day 01/30/12 1724 01/30/12 1732   01/30/12 1200   vancomycin (VANCOCIN) 50 mg/mL oral solution 500 mg        500 mg Oral 4 times per day 01/30/12 1001 02/13/12 1159   01/30/12 1100   metroNIDAZOLE (FLAGYL) IVPB 500 mg  Status:  Discontinued        500 mg 100 mL/hr over 60 Minutes Intravenous Every 8 hours 01/30/12 1001 02/05/12 0926   01/30/12 1015   ciprofloxacin (CIPRO) IVPB 400 mg        400 mg 200 mL/hr over 60 Minutes Intravenous NOW 01/30/12 1006 01/30/12 1251   01/29/12 1200   vancomycin (VANCOCIN) 750 mg in sodium chloride 0.9 % 150 mL IVPB  Status:  Discontinued        750 mg 150 mL/hr over 60 Minutes Intravenous Every M-W-F (Hemodialysis) 01/29/12 0615 01/29/12 0755   01/28/12 2308   ciprofloxacin (CIPRO) IVPB 400 mg  Status:  Discontinued        400 mg 200 mL/hr over 60 Minutes Intravenous Every 24 hours 01/28/12 0941 01/29/12 0755   01/27/12 2230   ciprofloxacin (CIPRO) IVPB 400 mg  Status:  Discontinued        400 mg 200 mL/hr over 60 Minutes Intravenous Every 12 hours 01/27/12 2209 01/28/12 0941   01/26/12 2300   vancomycin (VANCOCIN) 1,500 mg in sodium chloride 0.9 % 500 mL IVPB        1,500 mg 250 mL/hr over 120 Minutes Intravenous  Once 01/26/12 2120 01/27/12 0226   01/26/12 2200   piperacillin-tazobactam (ZOSYN) IVPB 2.25 g  Status:  Discontinued        2.25 g 100 mL/hr over 30 Minutes Intravenous 3 times per day 01/26/12 2118  01/30/12 1705          Assessment: Patient is a 56 y.o with PNA, pharmacy asked to add vancomycin today for HCAP.  Also on cefepime started on 6/28 for pseudomonas PNA/bacteriemia and Group B strep bacteremia.  Plan to treat with abx for 14 days per ID.  Today is cefepime day #10 of planned 14 day course.   Plan:  1) Add vancomycin 1500 mg IV x 1 now, then 750 mg IV q HD session.  Will check trough level as necessary. 2) Continue cefepime 2g IV after each HD session. 3) Will follow-up for d/c of cefepime after 14 days completed (7/11).  Reece Leader, Pharm D 02/09/2012 8:40 AM

## 2012-02-09 NOTE — Procedures (Signed)
PCCM Bronchoscopy Procedure Note  The patient was informed of the risks (including but not limited to bleeding, infection, respiratory failure, lung injury, tooth/oral injury) and benefits of the procedure and gave consent, see chart.  Indication: Mucus plugging L lung  Location: 2914  Condition pre procedure: sedated on vent  Medications for procedure: propofol and fentanyl gtt  Procedure description: The bronchoscope was introduced through the  endotracheal tube and passed to the bilateral lungs to the level of the subsegmental bronchi throughout the tracheobronchial tree.  Airway exam revealed normal mucosa and anatomy on right without airway lesion.  The left mainstem bronchus was occluded by a thick, brown mucus plug which was removed by suctioning.  Washings of the left lung with 20cc of saline injected was sent for culture.  Procedures performed: therapeutic aspiration, bronch wash  Specimens sent: bronch wash  Condition post procedure: sedated on vent  Patient instructions: f/u culture  [name] PCCM Pager: [phone] Cell: [phone] If no response, call [phone]

## 2012-02-09 NOTE — Progress Notes (Signed)
Pt's family member Ulysees Robarts called to get updates on patient's status. I explained to him, I could not release information over the phone without a password set up since it will be a violation of the patient's privacy and confidentiality. He got angry, cursed me over the phone and told me to make sure "you pay his bill when he comes home then". Then he hang up the phone. I informed the charge nurse.

## 2012-02-09 NOTE — Progress Notes (Addendum)
Subjective:  Drowsy, on dialysis, disruptive occasionally, raising his arms. .  Objective Vital signs in last 24 hours: Filed Vitals:   02/09/12 0500 02/09/12 0600 02/09/12 0700 02/09/12 0814  BP: 120/79 101/67 106/67 143/77  Pulse: 91 91 88 94  Temp:   97.9 F (36.6 C)   TempSrc:   Axillary   Resp: 18 16 16 22   Height:      Weight: 65.2 kg (143 lb 11.8 oz)     SpO2: 100% 100% 100% 99%   Weight change:   Intake/Output Summary (Last 24 hours) at 02/09/12 1052 Last data filed at 02/09/12 0600  Gross per 24 hour  Intake  799.8 ml  Output   3800 ml  Net -3000.2 ml   Labs: Basic Metabolic Panel:  Lab 02/08/12 0454 02/08/12 0558 02/07/12 0530  NA 137 139 139  K 4.6 4.1 3.7  CL 99 99 99  CO2 27 25 28   GLUCOSE 113* 97 104*  BUN 15 32* 21  CREATININE 2.99* 5.18* 3.42*  CALCIUM 9.2 9.6 9.5  ALB -- -- --  PHOS 3.8 -- 3.1   Liver Function Tests:  Lab 02/08/12 1914 02/07/12 0530 02/04/12 0434  AST -- -- 19  ALT -- -- 12  ALKPHOS -- -- 135*  BILITOT -- -- 0.2*  PROT -- -- 6.3  ALBUMIN 2.3* 1.9* 1.6*   No results found for this basename: LIPASE:3,AMYLASE:3 in the last 168 hours No results found for this basename: AMMONIA:3 in the last 168 hours CBC:  Lab 02/08/12 0558 02/07/12 0530 02/06/12 0545 02/05/12 0406 02/04/12 0434  WBC 4.6 4.5 7.0 -- --  NEUTROABS -- -- -- -- --  HGB 8.5* 8.1* 8.3* -- --  HCT 25.8* 24.9* 26.3* -- --  MCV 98.5 98.4 98.9 98.9 97.0  PLT 198 169 167 -- --   Cardiac Enzymes:  Lab 02/09/12 0315 02/09/12 0046 02/08/12 1913  CKTOTAL 22 25 28   CKMB 1.6 1.5 1.9  CKMBINDEX -- -- --  TROPONINI <0.30 <0.30 <0.30   CBG:  Lab 02/09/12 0603 02/08/12 2327 02/08/12 2104 02/06/12 1640 02/05/12 0821  GLUCAP 101* 140* 113* 97 101*    Physical Exam: General: oriented to place, not year today, confused Heart: Tachy regular, no rub heard Lungs:rare rhonchi Abdomen: Bs=+, soft , tender epigastric area Extremities: Dialysis Access: edema resolved,  patent left upper arm avgg with   Area of erosion upper portion  Outpatient Dialysis: [facility] TTS LUA AVGG. 4 hr, EDW 69.5 kg. EPO 4600u. Zemplar 1 ug. Nepro x 1. Heparin 2000u. 3.0K/2.25Ca bath. 400/800.  Assessment/Plan  1. Resp- mucous plugging, intubation and bronch yest per CCM.  On vent now.  2. ESRD TTS/[facility]- dialysis Tuesday. Is below dry weight by 4 kg.  3. PNA/bacteremia/UTI/Cdif- per ID/primary 4. Alcohol abuse 5. Anemia:/ACDz and ABLA= hgb 8.3 Aranesp 60 mcg/wk op epo was 4600/ not on iron with sepsis 6. s/p PEA Arrest 7. HTN- was on fosinopril 10/d clonidine 0.3 bid at home. Getting TTS-3 patch and lisinopril (Rx sub) here. BP's low side, may be able to decrease meds if persists. Below EDW now. 8. Secondary Hyperparathyroidism=Ca=9.5 low albumin,  Hold Zemplar. Low phos 3-4 range, no binders for now.  Vinson Moselle  MD [employer] [phone] pgr    [phone] cell 02/09/2012, 10:52 AM

## 2012-02-09 NOTE — Progress Notes (Addendum)
Name: [name] MRN: [id number] DOB: 1955/08/23    LOS: 14 Date of admit 01/26/2012  5:09 PM    PULMONARY / CRITICAL CARE MEDICINE  BRIEF  56 year old male with PMH of HTN, COPD, Hep C, polysubstance abuse, chronic recurrent pancreatitis, PUD and ESRD. He initially called EMS from home complaining of chest pain. At arrival EMS found him in PEA arrest. He was intubated in the field, advanced CPR was initiated and return to spontaneous circulation was obtained after 5 to 6 minutes. Initial EKG showed peaked T waves and K+ of 6.4. At arrival was unresponsive to painful stimuli. Patient reportedly had missed dialysis for the week PTA.   KEY SOCIAL HX Patient's sister, [name] [phone]). Patient has brothers as well. [name]  Most involved and admitted to patient problems with non compliance and substance abuse   EVENTS 6/25 - increasing pressors, fluid bolus for low bp, hypoglycemic requiring D10 6/26 - agitation, some bleeding from upper airway, diarrhea overnight--> flexiseal 6/28 - Severe agitation requiring increased fent gtt overnight, Transfused 1 u on HD            CT abd  - fracture rt 6-8 ribs, splenomegaly, LLL consolidation 6/28 - precedex/ klonopin added with decreased fent gtt 7/01 - Not weanable due to agitation on WUA 7/2 - improved, extubated 7/3 - Tx to IM TS 7/6 - Hypertensive, tachycardic, respiratory distress, intubated  SUBJECTIVE/OVERNIGHT/INTERVAL HX Intubated 7/6, left lung collapse despite bag lavage  Vital Signs: Temp:  [97.8 F (36.6 C)-99.5 F (37.5 C)] 97.9 F (36.6 C) (07/07 0700) Pulse Rate:  [83-130] 88  (07/07 0700) Resp:  [16-37] 16  (07/07 0700) BP: (101-216)/(67-127) 106/67 mmHg (07/07 0700) SpO2:  [89 %-100 %] 100 % (07/07 0700) FiO2 (%):  [100 %] 100 % (07/07 0319) Weight:  [65.2 kg (143 lb 11.8 oz)-68.3 kg (150 lb 9.2 oz)] 65.2 kg (143 lb 11.8 oz) (07/07 0500)  Physical Examination: Gen: sedated on vent HEENT: NCAT,  ETT PULM: diminished on L, Rhonchi R CV; RRR, systolic murmur Ab: BS+, soft, nontender Ext: warm, no edema Neuro: sedated on vent    ASSESSMENT AND PLAN:    Patient Active Problem List  Diagnosis  . SUBSTANCE ABUSE, MULTIPLE  . PEPTIC ULCER DISEASE  . Chronic recurrent pancreatitis  . End stage renal disease on dialysis  . Alcohol abuse  . COPD (chronic obstructive pulmonary disease)  . Neuropathy  . Protein malnutrition  . PEA (Pulseless electrical activity)  . Acute respiratory failure with hypoxia  . Hyperkalemia  . Bacteremia due to Pseudomonas  . Sepsis due to Streptococcus, group B  . C. difficile colitis  . Anemia  . Thrombocytopenia     PULMONARY  Lab 02/08/12 1806 02/08/12 1537  PHART 7.487* 7.480*  PCO2ART 39.0 39.5  PO2ART 74.0* 53.0*  HCO3 29.6* 29.4*  O2SAT 96.0 89.0    CXR: 7/6 worsened collapse LLL ETT:  7/1>>>7/3            7/6>>>  A:   Respiratory failure secondary to cardiac arrest>>>resolved.  New failure on 7/6 likely related to mucus plugging.  History of COPD Respiratory and metabolic acidosis - resolved LLL Aspiration pna d/t pseudomonas, improved  Fracture rt 6-8 ribs with cpr  P:   -bronch today as minimal secretions with bag/lavage overnight -daily CXR/WUA/WBT/ABG -full vent support today, wean FiO2 post bronch  CARDIOVASCULAR  Lab 02/09/12 0315 02/09/12 0046 02/08/12 1913 02/04/12 0433  TROPONINI <0.30 <0.30 <0.30 --  LATICACIDVEN -- -- -- --  PROBNP -- -- -- 30359.0*   ECG:  Peak T waves, Sinus rhythm at admit on 6/23 -> normalized 6/24 Lines: Left IJ CVL 6/24 >> 7/3              A:  PEA arrest. Due to  metabolic derrangements secondary to non compliance with dialysis and sepsis -  septic/ cardiogenic shock --resolved  History of hypertension -BP significantly elevated Mild bump in troponin since admit probably secondary to sepsis.     P:  -7/6 hypotension, hold BP meds, decrease sedation -PRN labetalol -assess  cardiac enzymes, EKG  RENAL  Lab 02/08/12 1914 02/08/12 0558 02/07/12 0530 02/06/12 0545 02/05/12 0406  NA 137 139 139 137 138  K 4.6 4.1 -- -- --  CL 99 99 99 99 99  CO2 27 25 28 28 28   BUN 15 32* 21 25* 23  CREATININE 2.99* 5.18* 3.42* 3.20* 3.01*  CALCIUM 9.2 9.6 9.5 9.5 9.5  MG -- -- -- -- --  PHOS 3.8 -- 3.1 -- --   Intake/Output      07/06 0701 - 07/07 0700 07/07 0701 - 07/08 0700   P.O.     I.V. (mL/kg) 749.8 (11.5)    Other     IV Piggyback 50    Total Intake(mL/kg) 799.8 (12.3)    Other 3800    Stool     Total Output 3800    Net -3000.2         Urine Occurrence 1 x     Foley:  6/23 >> out  A:   ESRD on hemodialysis Hyperkalemia, hyponatremia at admit on 01/26/2012. S/p emergent HD on 01/26/12.     P:   -HD per Nephrology (last 7/6 with 3.5 L removed)   GASTROINTESTINAL  Lab 02/08/12 1914 02/07/12 0530 02/04/12 0434  AST -- -- 19  ALT -- -- 12  ALKPHOS -- -- 135*  BILITOT -- -- 0.2*  PROT -- -- 6.3  ALBUMIN 2.3* 1.9* 1.6*    A:  History of PUD, CDIFF Colitis with pos C Diff PCR  P:   -oral vanco -->see ID -H2  -monitor H/H daily  HEMATOLOGIC  Lab 02/08/12 0558 02/07/12 0530 02/06/12 0545 02/05/12 0406 02/04/12 0434  HGB 8.5* 8.1* 8.3* 8.6* 8.6*  HCT 25.8* 24.9* 26.3* 26.5* 26.1*  PLT 198 169 167 138* 98*  INR -- -- -- -- --  APTT -- -- -- -- --   A:  Anemic of critical illness / Thrombocytopenia  Stable, PLT improving  P:  - goal Hb 7 & above   INFECTIOUS  Lab 02/08/12 1913 02/08/12 0558 02/07/12 0530 02/06/12 0545 02/05/12 0406 02/04/12 0434 02/03/12 0330  WBC -- 4.6 4.5 7.0 10.6* 10.0 --  PROCALCITON 3.11 -- -- -- -- 3.16 3.23   Cultures:  6/23 urine >> strep agalactiae 6/23 resp >>strep agalactiae 6/23 bld >> pseudomonas, strep agalactiae 6/26 C diff pCR POS 6/27 blood X 2  >> neg  7/6 Sputum >>>   ABx   Pip/ Tazo 6/23 >> 6/27 cipro 6/23 >>6/27 vanc 6/23 >> 6/27 Cefepime 6/27 (pseudomonas, Strep agalactiae)>> PO  vanc 6/27 (CDIFF)>> Flagyl 6/27 (CDIFF)>>7/3 Vanc (HCAP) >>  A:   Pseudomonas sepsis, d/t LLL PNA d/t pseudomonas History of Hep C Group B Strep Agalactiae bacteremia C. Diff colitis Mucus plug, likely HCAP on 7/6  P:   -add Vanc IV for HCAP in addition to Cefepime -if spikes, detiorates change cefepime  to meropenem -f/u re-check sputum, pct   ENDOCRINE  Lab 02/08/12 2327 02/08/12 2104 02/06/12 1640 02/05/12 0821 02/04/12 2320  GLUCAP 140* 113* 97 101* 94   A:   Hypoglycemia - resolved  P:   -check CBG's Q6 -add D5 maintenence while NPO  NEUROLOGIC  A:  Encephalopathy - agitated delirium, h/o polysubstance abuse  P:   -Cont Duragesic patch, Klonopin  -propofol and fentanyl for sedation while intubated (HTN'sive) -d/c scheduled ativan   BEST PRACTICE / DISPOSITION - Level of Care:  ICU - Primary Service:  IMTS - Consultants:  Nephrology, ID - Code Status:  Full code - Diet:  PO - DVT Px:  SCDs - GI Px: H2 - Skin Integrity:  Multiple open ulcerations on legs with crusting - Social / Family: no family 7/6 at bedside, attempted to call family with no response to update on patients decline and needed procedure consent.     CC time 40 minutes  Yolonda Kida PCCM Pager: [phone] Cell: [phone] If no response, call [phone]

## 2012-02-10 ENCOUNTER — Inpatient Hospital Stay (HOSPITAL_COMMUNITY): Payer: Medicare Other

## 2012-02-10 LAB — CBC WITH DIFFERENTIAL/PLATELET
Basophils Absolute: 0.1 10*3/uL (ref 0.0–0.1)
Basophils Relative: 1 % (ref 0–1)
Eosinophils Absolute: 0.2 10*3/uL (ref 0.0–0.7)
Eosinophils Relative: 3 % (ref 0–5)
HCT: 23.8 % — ABNORMAL LOW (ref 39.0–52.0)
MCH: 31.3 pg (ref 26.0–34.0)
MCHC: 31.5 g/dL (ref 30.0–36.0)
Monocytes Absolute: 0.6 10*3/uL (ref 0.1–1.0)
Neutro Abs: 3.8 10*3/uL (ref 1.7–7.7)
RDW: 15.4 % (ref 11.5–15.5)

## 2012-02-10 LAB — CULTURE, RESPIRATORY W GRAM STAIN

## 2012-02-10 LAB — BLOOD GAS, ARTERIAL
Drawn by: 362741
O2 Saturation: 98.2 %
PEEP: 5 cmH2O
Patient temperature: 98.6
RATE: 16 resp/min

## 2012-02-10 LAB — BASIC METABOLIC PANEL
BUN: 28 mg/dL — ABNORMAL HIGH (ref 6–23)
Chloride: 101 mEq/L (ref 96–112)
Creatinine, Ser: 5.24 mg/dL — ABNORMAL HIGH (ref 0.50–1.35)
GFR calc Af Amer: 13 mL/min — ABNORMAL LOW (ref >90)
GFR calc non Af Amer: 11 mL/min — ABNORMAL LOW (ref >90)

## 2012-02-10 LAB — CARDIAC PANEL(CRET KIN+CKTOT+MB+TROPI)
CK, MB: 2.3 ng/mL (ref 0.3–4.0)
Relative Index: INVALID (ref 0.0–2.5)
Total CK: 18 U/L (ref 7–232)
Total CK: 16 U/L (ref 7–232)
Total CK: 18 U/L (ref 7–232)
Troponin I: <0.3 ng/mL (ref <0.30)

## 2012-02-10 LAB — GLUCOSE, CAPILLARY
Glucose-Capillary: 85 mg/dL (ref 70–99)
Glucose-Capillary: 101 mg/dL — ABNORMAL HIGH (ref 70–99)

## 2012-02-10 MED ORDER — HEPARIN SODIUM (PORCINE) 1000 UNIT/ML DIALYSIS
2000.0000 [IU] | INTRAMUSCULAR | Status: DC | PRN
  Administered 2012-02-11: 2000 [IU] via INTRAVENOUS_CENTRAL
  Filled 2012-02-10: qty 2

## 2012-02-10 MED ORDER — PRO-STAT SUGAR FREE PO LIQD
30.0000 mL | Freq: Three times a day (TID) | ORAL | Status: DC
  Administered 2012-02-10 – 2012-02-11 (×5): 30 mL
  Filled 2012-02-10 (×9): qty 30

## 2012-02-10 MED ORDER — NEPRO/CARBSTEADY PO LIQD
1000.0000 mL | ORAL | Status: DC
  Filled 2012-02-10: qty 1000

## 2012-02-10 MED ORDER — NEPRO/CARBSTEADY PO LIQD
1000.0000 mL | ORAL | Status: DC
  Administered 2012-02-10 – 2012-02-11 (×2): 1000 mL
  Filled 2012-02-10 (×4): qty 1000

## 2012-02-10 NOTE — Progress Notes (Signed)
Clinical Social Work-service line CSW reviewed chart-provided handoff report to unit based CSW L.Judkins LCSWA-SW will continue to follow as appropriate and available PRN for psychosocial support and any other needs that may arise- Jodean Lima, [phone]

## 2012-02-10 NOTE — Progress Notes (Signed)
Name: [name] MRN: [id number] DOB: [birth year]    LOS: 15 Date of admit 01/26/2012  5:09 PM    PULMONARY / CRITICAL CARE MEDICINE  BRIEF  56 year old male with PMH of HTN, COPD, Hep C, polysubstance abuse, chronic recurrent pancreatitis, PUD and ESRD. He initially called EMS from home complaining of chest pain. At arrival EMS found him in PEA arrest. He was intubated in the field, advanced CPR was initiated and return to spontaneous circulation was obtained after 5 to 6 minutes. Initial EKG showed peaked T waves and K+ of 6.4. At arrival was unresponsive to painful stimuli. Patient reportedly had missed dialysis for the week PTA.   KEY SOCIAL HX Patient's sister, [name] [phone]). Patient has brothers as well. [name]  Most involved and admitted to patient problems with non compliance and substance abuse   EVENTS 6/25 - increasing pressors, fluid bolus for low bp, hypoglycemic requiring D10 6/26 - agitation, some bleeding from upper airway, diarrhea overnight--> flexiseal 6/28 - Severe agitation requiring increased fent gtt overnight, Transfused 1 u on HD            CT abd  - fracture rt 6-8 ribs, splenomegaly, LLL consolidation 6/28 - precedex/ klonopin added with decreased fent gtt 7/01 - Not weanable due to agitation on WUA 7/2 - improved, extubated 7/3 - Tx to IM TS 7/6 - Hypertensive, tachycardic, respiratory distress, intubated 7/7- bronch for collapse  SUBJECTIVE/OVERNIGHT/INTERVAL HX improved  Vital Signs: Temp:  [98 F (36.7 C)-99.5 F (37.5 C)] 98.9 F (37.2 C) (07/08 0800) Pulse Rate:  [65-113] 105  (07/08 0800) Resp:  [16-30] 30  (07/08 0800) BP: (89-167)/(54-91) 167/78 mmHg (07/08 0800) SpO2:  [98 %-100 %] 99 % (07/08 0800) FiO2 (%):  [50 %-60 %] 50 % (07/08 0800) Weight:  [67.4 kg (148 lb 9.4 oz)] 67.4 kg (148 lb 9.4 oz) (07/08 0318)  Physical Examination: Gen: sedated on vent HEENT: NCAT, ETT PULM: coarse, reduced CV; RRR, systolic  murmur Ab: BS+, soft, nontender Ext: warm, no edema Neuro: sedated on vent, rass 1  ASSESSMENT AND PLAN:    Patient Active Problem List  Diagnosis  . SUBSTANCE ABUSE, MULTIPLE  . PEPTIC ULCER DISEASE  . Chronic recurrent pancreatitis  . End stage renal disease on dialysis  . Alcohol abuse  . COPD (chronic obstructive pulmonary disease)  . Neuropathy  . Protein malnutrition  . PEA (Pulseless electrical activity)  . Acute respiratory failure with hypoxia  . Hyperkalemia  . Bacteremia due to Pseudomonas  . Sepsis due to Streptococcus, group B  . C. difficile colitis  . Anemia  . Thrombocytopenia     PULMONARY  Lab 02/10/12 0440 02/09/12 1015 02/08/12 1806 02/08/12 1537  PHART 7.389 7.449 7.487* 7.480*  PCO2ART 41.0 40.6 39.0 39.5  PO2ART 82.4 61.0* 74.0* 53.0*  HCO3 24.2* 28.3* 29.6* 29.4*  O2SAT 98.2 92.0 96.0 89.0    CXR: 7/6 worsened collapse LLL ETT:  7/1>>>7/3            7/6>>>  A:   Respiratory failure secondary to cardiac arrest>>>resolved.  New failure on 7/6 collapse History of COPD Respiratory and metabolic acidosis - resolved LLL Aspiration pna d/t pseudomonas, improved  Fracture rt 6-8 ribs with cpr  P:   -pcxr reviewed, some increase  -may need to keep peep at 8 overnight, currently found on 5 -wean sbt , cpap 5ps5 , assess 2 hours rsbi -follow secretion status, thick now -pcxr in am  -chest  pt to left  CARDIOVASCULAR  Lab 02/10/12 0430 02/09/12 2150 02/09/12 1607 02/09/12 1115 02/09/12 0315 02/04/12 0433  TROPONINI <0.30 <0.30 <0.30 <0.30 <0.30 --  LATICACIDVEN -- -- -- -- -- --  PROBNP -- -- -- -- -- 30359.0*   ECG:  Peak T waves, Sinus rhythm at admit on 6/23 -> normalized 6/24 Lines: Left IJ CVL 6/24 >> 7/3              A:  PEA arrest. Due to  metabolic derrangements secondary to non compliance with dialysis and sepsis -  septic/ cardiogenic shock --resolved  History of hypertension -BP significantly elevated Mild bump in troponin  since admit probably secondary to sepsis.    P:  -PRN labetalol -tele -trop neg  RENAL  Lab 02/10/12 0430 02/08/12 1914 02/08/12 0558 02/07/12 0530 02/06/12 0545  NA 136 137 139 139 137  K 4.1 4.6 -- -- --  CL 101 99 99 99 99  CO2 24 27 25 28 28   BUN 28* 15 32* 21 25*  CREATININE 5.24* 2.99* 5.18* 3.42* 3.20*  CALCIUM 9.1 9.2 9.6 9.5 9.5  MG -- -- -- -- --  PHOS -- 3.8 -- 3.1 --   Intake/Output      07/07 0701 - 07/08 0700 07/08 0701 - 07/09 0700   I.V. (mL/kg) 2103.2 (31.2) 81.8 (1.2)   IV Piggyback     Total Intake(mL/kg) 2103.2 (31.2) 81.8 (1.2)   Other     Total Output     Net +2103.2 +81.8        Stool Occurrence 1 x     Foley:  6/23 >> out  A:   ESRD on hemodialysis Hyperkalemia, hyponatremia at admit on 01/26/2012. S/p emergent HD on 01/26/12.     P:   -HD per Nephrology (last 7/6 with 3.5 L removed) Per renal  GASTROINTESTINAL  Lab 02/08/12 1914 02/07/12 0530 02/04/12 0434  AST -- -- 19  ALT -- -- 12  ALKPHOS -- -- 135*  BILITOT -- -- 0.2*  PROT -- -- 6.3  ALBUMIN 2.3* 1.9* 1.6*    A:  History of PUD, CDIFF Colitis with pos C Diff PCR  P:   -oral vanco -->see ID -H2  -monitor H/H daily Start TF as unlikely to extubate  HEMATOLOGIC  Lab 02/10/12 0430 02/08/12 0558 02/07/12 0530 02/06/12 0545 02/05/12 0406  HGB 7.5* 8.5* 8.1* 8.3* 8.6*  HCT 23.8* 25.8* 24.9* 26.3* 26.5*  PLT 235 198 169 167 138*  INR -- -- -- -- --  APTT -- -- -- -- --   A:  Anemic of critical illness / Thrombocytopenia  Stable, PLT improving  P:  - goal Hb 7 & above scd Contraindication sub q hep?  INFECTIOUS  Lab 02/10/12 0430 02/08/12 1913 02/08/12 0558 02/07/12 0530 02/06/12 0545 02/05/12 0406 02/04/12 0434  WBC 5.9 -- 4.6 4.5 7.0 10.6* --  PROCALCITON -- 3.11 -- -- -- -- 3.16   Cultures:  6/23 urine >> strep agalactiae 6/23 resp >>strep agalactiae 6/23 bld >> pseudomonas, strep agalactiae 6/26 C diff pCR POS 6/27 blood X 2  >> neg  7/6 Sputum >>>rare  candida 7/7 BAL bronch>>>  ABx   Pip/ Tazo 6/23 >> 6/27 cipro 6/23 >>6/27 vanc 6/23 >> 6/27 Cefepime 6/27 (pseudomonas, Strep agalactiae)>> PO vanc 6/27 (CDIFF)>> Flagyl 6/27 (CDIFF)>>7/3 Vanc (HCAP) >>  A:   Pseudomonas sepsis, d/t LLL PNA d/t pseudomonas History of Hep C Group B Strep Agalactiae bacteremia C. Diff colitis  Mucus plug, likely HCAP on 7/6  P:   -add Vanc IV for HCAP in addition to Cefepime -if spikes, detiorates change cefepime to meropenem -will have to limit course abx in setting cdiff  ENDOCRINE  Lab 02/10/12 0005 02/09/12 1941 02/09/12 1717 02/09/12 0603 02/08/12 2327  GLUCAP 96 101* 99 101* 140*   A:   Hypoglycemia - resolved  P:   -check CBG's Q6 -add D5 maintenence while NPO  NEUROLOGIC  A:  Encephalopathy - agitated delirium, h/o polysubstance abuse  P:   -Cont Duragesic patch, Klonopin  -propofol and fentanyl for sedation while intubated (HTN'sive) -d/c scheduled ativan, use short acting meds  BEST PRACTICE / DISPOSITION - Level of Care:  ICU - Primary Service:  IMTS - Consultants:  Nephrology, ID - Code Status:  Full code - Diet:  TF 7/8 - DVT Px:  SCDs - GI Px: H2 - Skin Integrity:  Multiple open ulcerations on legs with crusting - Social / Family: no family 7/6 at bedside   CC time 30 minutes  Kizzie Bane PCCM Pager: [phone] Cell: [phone] If no response, call [phone]

## 2012-02-10 NOTE — Progress Notes (Signed)
Patient ID: [name], male   DOB: 16-Feb-1956, 56 y.o.   MRN: [id number]   [facility] Progress Note    Subjective:   No acute events overnight- nods 'no' when asked if in pain   Objective:   BP 167/78  Pulse 105  Temp 98.9 F (37.2 C) (Axillary)  Resp 30  Ht 6' (1.829 m)  Wt 67.4 kg (148 lb 9.4 oz)  BMI 20.15 kg/m2  SpO2 99%  Physical Exam: WUJ:[id number]/AOZHY on vent QMV:HQION regular tachycardia, normal S1 and S2  Resp:Coarse BS bilaterally, no rales GEX:BMWU, flat, NT, BS normal Ext:No LE edema  Labs: BMET  Lab 02/10/12 0430 02/08/12 1914 02/08/12 0558 02/07/12 0530 02/06/12 0545 02/05/12 0406 02/04/12 0434  NA 136 137 139 139 137 138 132*  K 4.1 4.6 4.1 3.7 3.6 4.1 3.7  CL 101 99 99 99 99 99 98  CO2 24 27 25 28 28 28 26   GLUCOSE 95 113* 97 104* 98 96 128*  BUN 28* 15 32* 21 25* 23 39*  CREATININE 5.24* 2.99* 5.18* 3.42* 3.20* 3.01* 3.78*  ALB -- -- -- -- -- -- --  CALCIUM 9.1 9.2 9.6 9.5 9.5 9.5 9.7  PHOS -- 3.8 -- 3.1 -- -- --   CBC  Lab 02/10/12 0430 02/08/12 0558 02/07/12 0530 02/06/12 0545  WBC 5.9 4.6 4.5 7.0  NEUTROABS 3.8 -- -- --  HGB 7.5* 8.5* 8.1* 8.3*  HCT 23.8* 25.8* 24.9* 26.3*  MCV 99.2 98.5 98.4 98.9  PLT 235 198 169 167    Medications:      . antiseptic oral rinse  15 mL Mouth Rinse BID  . antiseptic oral rinse  15 mL Mouth Rinse QID  . ceFEPime (MAXIPIME) IV  2 g Intravenous Q T,Th,Sat-1800  . chlorhexidine  15 mL Mouth Rinse BID  . clonazePAM  1 mg Oral TID  . cloNIDine  0.3 mg Transdermal Weekly  . famotidine  20 mg Oral Daily  . lisinopril  10 mg Oral Daily  . multivitamin  1 tablet Oral QHS  . mupirocin cream  1 application Topical BID  . vancomycin  1,500 mg Intravenous Once  . vancomycin  500 mg Oral Q6H  . vancomycin  750 mg Intravenous Q T,Th,Sa-HD     Assessment/ Plan:   1. VDRF- secondary to mucous plugging and now s/p bronchoscopy.  2. ESRD TTS/[facility]- dialysis tomorrow. Is below dry weight  by 4 kg due to prolonged hospitalization.  3. PNA/bacteremia/UTI/Cdiff- on PO and IV vancomycin 4. Alcohol abuse- chronic and relapsed multiple times after attempts at rehab/abstinence. On scheduled clonazepam for management of withdrawal 5. Anemia:/ACDz and ABLA= hgb 7.5 Aranesp 60 mcg/wk op epo was 4600/ not on iron with sepsis- plan for PRBCs if <7 6. s/p PEA Arrest 7. HTN- on lisinopril and catapress patch with fair contorl 8. Secondary Hyperparathyroidism=Ca=9.5 low albumin, Hold Zemplar. Low phos 3-4 range, no binders for now.   Zetta Bills, MD 02/10/2012, 8:37 AM

## 2012-02-10 NOTE — Progress Notes (Signed)
Thank you for consult on Mr. [name]. Chart reviewed and note that patient was re intubated on 07/06. Will recheck in am to complete formal evaluation if patient extubated and able to re initiate therapy.

## 2012-02-10 NOTE — Progress Notes (Signed)
Nutrition Follow-up/ consult TF initiation and management   Intervention:   1. Decrease Nepro to 20 ml/hr 2. Add 30 ml Pro-stat via OG tube 3 times daily  EN + propofol will provide 1476 kcal, 84 gm protein, and 349 ml free water.  3. If IV fluids are d/c'd, pt will needs additional free water to meet hydration needs.  4. RD will continue to follow   *Currently unable to meet 100% estimated nutrition needs with extra kcal from propofol.    Assessment:   7/2 extubated -->heart healthy diet 7/5 down graded to D1 with nectar thick with increased confusion 7/5 MBS completed, upgraded to D3 with thin liquids  Pt continues on HD per schedule. After HD on 7/6 pt in respiratory distress and required intubation. Propofol for sedation, 11.8 ml/hr, providing 312 kcal per day from lipids.  Per nephrology notes, pt now below dry weight.    Diet Order:  NPO TF: Nepro with goal rate of 30 ml/hr, to provide 1296 kcal, 58 gm protein, and 523 ml free water.   Meds: Scheduled Meds:   . antiseptic oral rinse  15 mL Mouth Rinse BID  . antiseptic oral rinse  15 mL Mouth Rinse QID  . ceFEPime (MAXIPIME) IV  2 g Intravenous Q T,Th,Sat-1800  . chlorhexidine  15 mL Mouth Rinse BID  . clonazePAM  1 mg Oral TID  . cloNIDine  0.3 mg Transdermal Weekly  . famotidine  20 mg Oral Daily  . lisinopril  10 mg Oral Daily  . multivitamin  1 tablet Oral QHS  . mupirocin cream  1 application Topical BID  . vancomycin  1,500 mg Intravenous Once  . vancomycin  500 mg Oral Q6H  . vancomycin  750 mg Intravenous Q T,Th,Sa-HD   Continuous Infusions:   . dextrose 5 % and 0.45% NaCl 50 mL/hr at 02/09/12 1343  . fentaNYL infusion INTRAVENOUS 100 mcg/hr (02/09/12 1428)  . propofol 30 mcg/kg/min (02/10/12 0650)   PRN Meds:.sodium chloride, acetaminophen, albuterol, fentaNYL, heparin, ipratropium, labetalol, lidocaine, lidocaine-prilocaine, ondansetron (ZOFRAN) IV, pentafluoroprop-tetrafluoroeth, DISCONTD: heparin,  DISCONTD: heparin, DISCONTD: heparin, DISCONTD: heparin, DISCONTD: heparin, DISCONTD: heparin  Labs:  CMP     Component Value Date/Time   NA 136 02/10/2012 0430   K 4.1 02/10/2012 0430   CL 101 02/10/2012 0430   CO2 24 02/10/2012 0430   GLUCOSE 95 02/10/2012 0430   BUN 28* 02/10/2012 0430   CREATININE 5.24* 02/10/2012 0430   CREATININE 8.17* 10/15/2011 1709   CALCIUM 9.1 02/10/2012 0430   CALCIUM 7.6* 04/04/2011 0315   PROT 6.3 02/04/2012 0434   ALBUMIN 2.3* 02/08/2012 1914   AST 19 02/04/2012 0434   ALT 12 02/04/2012 0434   ALKPHOS 135* 02/04/2012 0434   BILITOT 0.2* 02/04/2012 0434   GFRNONAA 11* 02/10/2012 0430   GFRAA 13* 02/10/2012 0430   Magnesium  Date Value Range Status  01/27/2012 1.6  1.5 - 2.5 mg/dL Final   Phosphorus  Date Value Range Status  02/08/2012 3.8  2.3 - 4.6 mg/dL Final     Intake/Output Summary (Last 24 hours) at 02/10/12 1030 Last data filed at 02/10/12 0800  Gross per 24 hour  Intake 1717.8 ml  Output      0 ml  Net 1717.8 ml    Weight Status:  148 lbs (67.4 kg)  Dry weight: 69.5 kg  Body mass index is 20.15 kg/(m^2). Ve: 10.4 Tmax: 37.5 C Re-estimated needs:  1495 kcal, 100-110 gm protein  Nutrition Dx:  Inadequate  oral intake r/t inability to eat AEB NPO--ongoing   Goal:  Diet to advance or initiation of EN, unmet New Goal: EN of TF while pt remains intubated  Monitor:  Vent status, weight, HD, labs   Clarene Duke RD, LDN Pager [phone] After Hours pager [phone]

## 2012-02-10 NOTE — Progress Notes (Signed)
PT Cancellation Note  Treatment cancelled today due to medical issues with patient which prohibited therapy: pt reintubated and on sedation.   Sherley Mckenney 02/10/2012, 10:54 AM Pager [phone]

## 2012-02-11 ENCOUNTER — Inpatient Hospital Stay (HOSPITAL_COMMUNITY): Payer: Medicare Other

## 2012-02-11 LAB — CBC WITH DIFFERENTIAL/PLATELET
Basophils Absolute: 0.1 10*3/uL (ref 0.0–0.1)
Basophils Relative: 1 % (ref 0–1)
Eosinophils Relative: 4 % (ref 0–5)
HCT: 25.4 % — ABNORMAL LOW (ref 39.0–52.0)
Hemoglobin: 8.3 g/dL — ABNORMAL LOW (ref 13.0–17.0)
Lymphocytes Relative: 23 % (ref 12–46)
MCV: 97.7 fL (ref 78.0–100.0)
Monocytes Relative: 12 % (ref 3–12)
Neutro Abs: 3.7 10*3/uL (ref 1.7–7.7)
RBC: 2.6 MIL/uL — ABNORMAL LOW (ref 4.22–5.81)

## 2012-02-11 LAB — GLUCOSE, CAPILLARY
Glucose-Capillary: 92 mg/dL (ref 70–99)
Glucose-Capillary: 86 mg/dL (ref 70–99)
Glucose-Capillary: 80 mg/dL (ref 70–99)

## 2012-02-11 LAB — COMPREHENSIVE METABOLIC PANEL
ALT: 16 U/L (ref 0–53)
AST: 33 U/L (ref 0–37)
Alkaline Phosphatase: 143 U/L — ABNORMAL HIGH (ref 39–117)
CO2: 22 mEq/L (ref 19–32)
Chloride: 97 mEq/L (ref 96–112)
Creatinine, Ser: 6.55 mg/dL — ABNORMAL HIGH (ref 0.50–1.35)
GFR calc non Af Amer: 8 mL/min — ABNORMAL LOW (ref >90)
Potassium: 4.2 mEq/L (ref 3.5–5.1)
Sodium: 133 mEq/L — ABNORMAL LOW (ref 135–145)
Total Bilirubin: 0.4 mg/dL (ref 0.3–1.2)

## 2012-02-11 LAB — CARDIAC PANEL(CRET KIN+CKTOT+MB+TROPI)
CK, MB: 3 ng/mL (ref 0.3–4.0)
Relative Index: INVALID (ref 0.0–2.5)
Relative Index: INVALID (ref 0.0–2.5)
Total CK: 21 U/L (ref 7–232)
Total CK: 19 U/L (ref 7–232)

## 2012-02-11 LAB — TRIGLYCERIDES: Triglycerides: 206 mg/dL — ABNORMAL HIGH (ref <150)

## 2012-02-11 MED ORDER — MEROPENEM 500 MG IV SOLR
500.0000 mg | INTRAVENOUS | Status: DC
  Administered 2012-02-11 – 2012-02-20 (×10): 500 mg via INTRAVENOUS
  Filled 2012-02-11 (×13): qty 0.5

## 2012-02-11 MED ORDER — DEXTROSE 5 % IV SOLN
INTRAVENOUS | Status: DC
  Administered 2012-02-11: 50 mL via INTRAVENOUS
  Administered 2012-02-12 (×2): via INTRAVENOUS

## 2012-02-11 MED ORDER — HEPARIN SODIUM (PORCINE) 5000 UNIT/ML IJ SOLN
5000.0000 [IU] | Freq: Three times a day (TID) | INTRAMUSCULAR | Status: DC
  Administered 2012-02-11 – 2012-02-21 (×29): 5000 [IU] via SUBCUTANEOUS
  Filled 2012-02-11 (×33): qty 1

## 2012-02-11 MED ORDER — SODIUM CHLORIDE 0.9 % IV SOLN
500.0000 mg | INTRAVENOUS | Status: AC
  Administered 2012-02-11: 500 mg via INTRAVENOUS
  Filled 2012-02-11: qty 0.5

## 2012-02-11 MED ORDER — DARBEPOETIN ALFA-POLYSORBATE 100 MCG/0.5ML IJ SOLN
INTRAMUSCULAR | Status: AC
  Administered 2012-02-11: 100 ug
  Filled 2012-02-11: qty 0.5

## 2012-02-11 MED ORDER — DARBEPOETIN ALFA-POLYSORBATE 100 MCG/0.5ML IJ SOLN
100.0000 ug | INTRAMUSCULAR | Status: DC
  Filled 2012-02-11: qty 0.5

## 2012-02-11 NOTE — Progress Notes (Signed)
Chart reviewed.  No acute events in the last 24 hours.  Patient still intubated and being treated for HCAP.  Please reconsult once extubated and therapies re-initiated.

## 2012-02-11 NOTE — Progress Notes (Signed)
ANTIBIOTIC CONSULT NOTE - INITIAL  Pharmacy Consult:  Merrem Indication:  Sepsis, pseudomonas PNA/bacteremia, group B strep bacteremia, now HCAP  Allergies  Allergen Reactions  . Penicillins     "childhood reaction"    Patient Measurements: Height: 6' (182.9 cm) Weight: 154 lb 5.2 oz (70 kg) IBW/kg (Calculated) : 77.6    Vital Signs: Temp: 98 F (36.7 C) (07/09 0800) Temp src: Oral (07/09 0800) BP: 143/81 mmHg (07/09 1113) Pulse Rate: 89  (07/09 1113) Intake/Output from previous day: 07/08 0701 - 07/09 0700 In: 2398.7 [I.V.:1858.7; NG/GT:540] Out: -  Intake/Output from this shift: Total I/O In: 202.8 [I.V.:162.8; NG/GT:40] Out: -   Labs:  Basename 02/11/12 0407 02/10/12 0430 02/08/12 1914  WBC 6.1 5.9 --  HGB 8.3* 7.5* --  PLT 282 235 --  LABCREA -- -- --  CREATININE 6.55* 5.24* 2.99*   Estimated Creatinine Clearance: 12.3 ml/min (by C-G formula based on Cr of 6.55). No results found for this basename: VANCOTROUGH:2,VANCOPEAK:2,VANCORANDOM:2,GENTTROUGH:2,GENTPEAK:2,GENTRANDOM:2,TOBRATROUGH:2,TOBRAPEAK:2,TOBRARND:2,AMIKACINPEAK:2,AMIKACINTROU:2,AMIKACIN:2, in the last 72 hours   Microbiology: Recent Results (from the past 720 hour(s))  CULTURE, BLOOD (ROUTINE X 2)     Status: Normal   Collection Time   01/26/12  8:50 PM      Component Value Range Status Comment   Specimen Description BLOOD   Final    Special Requests     Final    Value: LEFT IJ BOTTLES DRAWN AEROBIC AND ANAEROBIC 10CC EACH   Culture  Setup Time [id number]   Final    Culture     Final    Value: GROUP B STREP(S.AGALACTIAE)ISOLATED     STAPHYLOCOCCUS SPECIES (COAGULASE NEGATIVE)     Note: THE SIGNIFICANCE OF ISOLATING THIS ORGANISM FROM A SINGLE SET OF BLOOD CULTURES WHEN MULTIPLE SETS ARE DRAWN IS UNCERTAIN. PLEASE NOTIFY THE MICROBIOLOGY DEPARTMENT WITHIN ONE WEEK IF SPECIATION AND SENSITIVITIES ARE REQUIRED.     Note: Gram Stain Report Called to,Read Back By and Verified With: LESLIE WILSON @  1444 01/27/12 BY KRAWS   Report Status 01/29/2012 FINAL   Final    Organism ID, Bacteria GROUP B STREP(S.AGALACTIAE)ISOLATED   Final   URINE CULTURE     Status: Normal   Collection Time   01/26/12  9:39 PM      Component Value Range Status Comment   Specimen Description URINE, CATHETERIZED   Final    Special Requests Normal   Final    Culture  Setup Time [id number]   Final    Colony Count >=100,000 COLONIES/ML   Final    Culture     Final    Value: GROUP B STREP(S.AGALACTIAE)ISOLATED     Note: TESTING AGAINST S. AGALACTIAE NOT ROUTINELY PERFORMED DUE TO PREDICTABILITY OF AMP/PEN/VAN SUSCEPTIBILITY.   Report Status 01/28/2012 FINAL   Final   CULTURE, BLOOD (ROUTINE X 2)     Status: Normal   Collection Time   01/26/12 10:06 PM      Component Value Range Status Comment   Specimen Description BLOOD RIGHT RADIAL A-LINE DRAW   Final    Special Requests BOTTLES DRAWN AEROBIC AND ANAEROBIC [facility]   Final    Culture  Setup Time [id number]   Final    Culture     Final    Value: PSEUDOMONAS AERUGINOSA     Note: Gram Stain Report Called to,Read Back By and Verified With: ROSE COLLOM @ 2150 ON 01/27/12 BY GOLLD   Report Status 01/30/2012 FINAL   Final    Organism ID, Bacteria PSEUDOMONAS  AERUGINOSA   Final   MRSA PCR SCREENING     Status: Normal   Collection Time   01/26/12 10:48 PM      Component Value Range Status Comment   MRSA by PCR NEGATIVE  NEGATIVE Final   CULTURE, RESPIRATORY     Status: Normal   Collection Time   01/29/12 12:06 AM      Component Value Range Status Comment   Specimen Description TRACHEAL ASPIRATE   Final    Special Requests NONE   Final    Gram Stain     Final    Value: NO WBC SEEN     NO SQUAMOUS EPITHELIAL CELLS SEEN     FEW GRAM POSITIVE COCCI IN PAIRS   Culture FEW PSEUDOMONAS AERUGINOSA   Final    Report Status 01/31/2012 FINAL   Final    Organism ID, Bacteria PSEUDOMONAS AERUGINOSA   Final   CLOSTRIDIUM DIFFICILE BY PCR     Status: Abnormal    Collection Time   01/30/12  4:00 AM      Component Value Range Status Comment   C difficile by pcr POSITIVE (*) NEGATIVE Final   WOUND CULTURE     Status: Normal   Collection Time   01/30/12 12:55 PM      Component Value Range Status Comment   Specimen Description WOUND RIGHT LEG   Final    Special Requests NONE   Final    Gram Stain     Final    Value: NO WBC SEEN     NO SQUAMOUS EPITHELIAL CELLS SEEN     NO ORGANISMS SEEN   Culture NO GROWTH 2 DAYS   Final    Report Status 02/01/2012 FINAL   Final   CULTURE, BLOOD (SINGLE)     Status: Normal   Collection Time   01/30/12  9:35 PM      Component Value Range Status Comment   Specimen Description BLOOD CENTRAL LINE   Final    Special Requests BOTTLES DRAWN AEROBIC AND ANAEROBIC 10CC   Final    Culture  Setup Time 01/31/2012 03:48   Final    Culture NO GROWTH 5 DAYS   Final    Report Status 02/06/2012 FINAL   Final   CULTURE, RESPIRATORY     Status: Normal   Collection Time   02/08/12  6:18 PM      Component Value Range Status Comment   Specimen Description TRACHEAL ASPIRATE   Final    Special Requests NONE   Final    Gram Stain     Final    Value: RARE WBC PRESENT, PREDOMINANTLY PMN     RARE SQUAMOUS EPITHELIAL CELLS PRESENT     NO ORGANISMS SEEN   Culture RARE CANDIDA ALBICANS   Final    Report Status 02/10/2012 FINAL   Final   CULTURE, RESPIRATORY     Status: Normal (Preliminary result)   Collection Time   02/09/12  9:29 AM      Component Value Range Status Comment   Specimen Description TRACHEAL ASPIRATE   Final    Special Requests NONE   Final    Gram Stain     Final    Value: MODERATE WBC PRESENT, PREDOMINANTLY PMN     FEW SQUAMOUS EPITHELIAL CELLS PRESENT     NO ORGANISMS SEEN   Culture FEW PSEUDOMONAS AERUGINOSA   Final    Report Status PENDING   Incomplete     Medical History: Past  Medical History  Diagnosis Date  . Pancytopenia     chronic  . Polysubstance abuse     chronic most notable for alcohol  .  Malignant hypertension   . Hepatitis C   . COPD (chronic obstructive pulmonary disease)   . Chronic recurrent pancreatitis     likely secondary to alcoholism  . Smoker   . Alcohol abuse   . Respiratory failure Jan 2012    Hx of VDRF   . Burn   . PUD (peptic ulcer disease)     two small ulcers on 2011 EGD, duodenitis noted on 2012 EGD w/o presence of ulcers  . Hep C w/o coma, chronic   . End-stage renal disease on hemodialysis     HD on MWF, [facility]       Assessment: 89 YOM was brought in by EMS with PEA arrest.  Patient has sepsis, Pseudomonal PNA and bacteremia, group B strep bacteremia, and HCAP previously on vancomycin and Cefepime.  Antibiotic adjusted to Merrem monotherapy today, day#12 of antibiotics.  He also has ESRD with his HD schedule now on TTS.   Patient continues on vancomycin PO for C.diff colitis.  Vanc 7/7 >> 7/9 Cefepime 6/28 >> 7/9, clinical failure Flagyl 6/27 >> 7/3 Vanc PO 6/27>> plan thru 7/11 Merrem 7/9 >>  6/23 urine >> strep agalactiae 6/23 bld x2 >> Pseudomonas, group B strep, CNS (1/2) FINAL 6/26 TA >>pseudomonas 6/27 C diff PCR>> positive 6/27 BCX >>Negative  6/27 right leg wound>> Negative. 7/6 sputum >> rare candida albicans  7/7 sputum>>    Plan:  - Merrem 500mg  IV Q24H, give post HD on HD days - Monitor clinical course - Consider changing D5W to NS d/t hyponatremia (on tube feeding)     Kenyen Candy D. Laney Potash, PharmD, BCPS Pager:  [phone] 02/11/2012, 11:57 AM

## 2012-02-11 NOTE — Progress Notes (Signed)
PT Cancellation Note  Treatment cancelled today due to medical issues with patient which prohibited therapy.  Issa Kosmicki 02/11/2012, 9:43 AM

## 2012-02-11 NOTE — Progress Notes (Signed)
Name: [name] MRN: [id number] DOB: Oct 30, 1955    LOS: 16 Date of admit 01/26/2012  5:09 PM    PULMONARY / CRITICAL CARE MEDICINE  BRIEF  56 year old male with PMH of HTN, COPD, Hep C, polysubstance abuse, chronic recurrent pancreatitis, PUD and ESRD. He initially called EMS from home complaining of chest pain. At arrival EMS found him in PEA arrest. He was intubated in the field, advanced CPR was initiated and return to spontaneous circulation was obtained after 5 to 6 minutes. Initial EKG showed peaked T waves and K+ of 6.4. At arrival was unresponsive to painful stimuli. Patient reportedly had missed dialysis for the week PTA.   KEY SOCIAL HX Patient's sister, [name] [phone]). Patient has brothers as well. Ms. Maue  Most involved and admitted to patient problems with non compliance and substance abuse   EVENTS 6/25 - increasing pressors, fluid bolus for low bp, hypoglycemic requiring D10 6/26 - agitation, some bleeding from upper airway, diarrhea overnight--> flexiseal 6/28 - Severe agitation requiring increased fent gtt overnight, Transfused 1 u on HD            CT abd  - fracture rt 6-8 ribs, splenomegaly, LLL consolidation 6/28 - precedex/ klonopin added with decreased fent gtt 7/01 - Not weanable due to agitation on WUA 7/2 - improved, extubated 7/3 - Tx to IM TS 7/6 - Hypertensive, tachycardic, respiratory distress, intubated 7/7- bronch for collapse 7/8- did not recollapse  SUBJECTIVE/OVERNIGHT/INTERVAL HX improved  Vital Signs: Temp:  [97.4 F (36.3 C)-98.9 F (37.2 C)] 98 F (36.7 C) (07/09 0800) Pulse Rate:  [73-109] 95  (07/09 0900) Resp:  [14-25] 14  (07/09 0900) BP: (123-214)/(65-108) 167/97 mmHg (07/09 0900) SpO2:  [91 %-100 %] 100 % (07/09 0900) FiO2 (%):  [40 %-50 %] 40 % (07/09 0900) Weight:  [70 kg (154 lb 5.2 oz)] 70 kg (154 lb 5.2 oz) (07/09 0500)  Physical Examination: Gen: sedated on vent, rass -2 HEENT: NCAT, ETT PULM: coarse,  reduced, improved left CV; RRR, systolic murmur Ab: BS+, soft, nontender Ext: warm, no edema Neuro: sedated on vent, rass 1  ASSESSMENT AND PLAN:    Patient Active Problem List  Diagnosis  . SUBSTANCE ABUSE, MULTIPLE  . PEPTIC ULCER DISEASE  . Chronic recurrent pancreatitis  . End stage renal disease on dialysis  . Alcohol abuse  . COPD (chronic obstructive pulmonary disease)  . Neuropathy  . Protein malnutrition  . PEA (Pulseless electrical activity)  . Acute respiratory failure with hypoxia  . Hyperkalemia  . Bacteremia due to Pseudomonas  . Sepsis due to Streptococcus, group B  . C. difficile colitis  . Anemia  . Thrombocytopenia     PULMONARY  Lab 02/10/12 0440 02/09/12 1015 02/08/12 1806 02/08/12 1537  PHART 7.389 7.449 7.487* 7.480*  PCO2ART 41.0 40.6 39.0 39.5  PO2ART 82.4 61.0* 74.0* 53.0*  HCO3 24.2* 28.3* 29.6* 29.4*  O2SAT 98.2 92.0 96.0 89.0    CXR: 7/9- lll aeration questionable, ett wnl ETT:  7/1>>>7/3            7/6>>>  A:   Respiratory failure secondary to cardiac arrest>>>resolved.  New failure on 7/6 collapse History of COPD Respiratory and metabolic acidosis - resolved LLL Aspiration pna d/t pseudomonas, improved  Fracture rt 6-8 ribs with cpr  P:   -pcxr reviewed, some increase  -may need to keep peep at 8 overnight, currently found on 5 -wean sbt , cpap 5 ps 5 , assess 1  hours rsbi, if fai, may need repeat bronch -secretions improved -pcxr in am  -chest pt to left dc in 24 hrs Add mucomyst's if no shortage  CARDIOVASCULAR  Lab 02/11/12 0407 02/10/12 2207 02/10/12 1555 02/10/12 1007 02/10/12 0430  TROPONINI <0.30 <0.30 <0.30 <0.30 <0.30  LATICACIDVEN -- -- -- -- --  PROBNP -- -- -- -- --   ECG:  Peak T waves, Sinus rhythm at admit on 6/23 -> normalized 6/24 Lines: Left IJ CVL 6/24 >> 7/3              A:  PEA arrest. Due to  metabolic derrangements secondary to non compliance with dialysis and sepsis -  septic/ cardiogenic  shock --resolved  History of hypertension -BP significantly elevated Mild bump in troponin since admit probably secondary to sepsis.    P:  -PRN labetalol -tele  RENAL  Lab 02/11/12 0407 02/10/12 0430 02/08/12 1914 02/08/12 0558 02/07/12 0530  NA 133* 136 137 139 139  K 4.2 4.1 -- -- --  CL 97 101 99 99 99  CO2 22 24 27 25 28   BUN 40* 28* 15 32* 21  CREATININE 6.55* 5.24* 2.99* 5.18* 3.42*  CALCIUM 9.1 9.1 9.2 9.6 9.5  MG -- -- -- -- --  PHOS -- -- 3.8 -- 3.1   Intake/Output      07/08 0701 - 07/09 0700 07/09 0701 - 07/10 0700   I.V. (mL/kg) 1858.7 (26.6) 162.8 (2.3)   NG/GT 540 40   Total Intake(mL/kg) 2398.7 (34.3) 202.8 (2.9)   Net +2398.7 +202.8         Foley:  6/23 >> out  A:   ESRD on hemodialysis Hyperkalemia, hyponatremia at admit on 01/26/2012. S/p emergent HD on 01/26/12.     P:   -HD per Nephrology (last 7/6 with 3.5 L removed) Per renal  GASTROINTESTINAL  Lab 02/11/12 0407 02/08/12 1914 02/07/12 0530  AST 33 -- --  ALT 16 -- --  ALKPHOS 143* -- --  BILITOT 0.4 -- --  PROT 8.0 -- --  ALBUMIN 1.9* 2.3* 1.9*    A:  History of PUD, CDIFF Colitis with pos C Diff PCR  P:   -oral vanco -->see ID -H2  TF started  HEMATOLOGIC  Lab 02/11/12 0407 02/10/12 0430 02/08/12 0558 02/07/12 0530 02/06/12 0545  HGB 8.3* 7.5* 8.5* 8.1* 8.3*  HCT 25.4* 23.8* 25.8* 24.9* 26.3*  PLT 282 235 198 169 167  INR -- -- -- -- --  APTT -- -- -- -- --   A:  Anemic of critical illness / Thrombocytopenia  Stable, PLT improving  P:  - goal Hb 7 & above scd Add sub q hep, high risk dvt  INFECTIOUS  Lab 02/11/12 0407 02/10/12 0430 02/08/12 1913 02/08/12 0558 02/07/12 0530 02/06/12 0545  WBC 6.1 5.9 -- 4.6 4.5 7.0  PROCALCITON -- -- 3.11 -- -- --   Cultures:  6/23 urine >> strep agalactiae 6/23 resp >>strep agalactiae 6/23 bld >> pseudomonas, strep agalactiae 6/26 C diff pCR POS 6/27 blood X 2  >> neg  7/6 Sputum >>>rare candida 7/7 BAL bronch>>>few  speudomonas  ABx   Pip/ Tazo 6/23 >> 6/27 cipro 6/23 >>6/27 vanc 6/23 >> 6/27 Cefepime 6/27 (pseudomonas, Strep agalactiae)>> PO vanc 6/27 (CDIFF)>> Flagyl 6/27 (CDIFF)>>7/3 Vanc (HCAP) >>  A:   Pseudomonas sepsis, d/t LLL PNA d/t pseudomonas History of Hep C Group B Strep Agalactiae bacteremia C. Diff colitis Mucus plug, likely HCAP on 7/6  P:   -  dc vanc -Clinical failure to cefipime, dc, , add merop, may need to consider inhaled ABX / ID consult -will have to limit course abx in setting cdiff  ENDOCRINE  Lab 02/11/12 0528 02/11/12 0429 02/10/12 2316 02/10/12 1543 02/10/12 1156  GLUCAP 86 92 85 101* 85   A:   Hypoglycemia - resolved  P:   -check CBG's Q6 -add D5 maintenence while NPO  NEUROLOGIC  A:  Encephalopathy - agitated delirium, h/o polysubstance abuse  P:   -Cont Duragesic patch, Klonopin  -propofol and fentanyl for sedation while intubated (HTN'sive)  BEST PRACTICE / DISPOSITION - Level of Care:  ICU - Primary Service:  IMTS - Consultants:  Nephrology, ID - Code Status:  Full code - Diet:  TF 7/8 - DVT Px:  SCDs - GI Px: H2 - Skin Integrity:  Multiple open ulcerations on legs with crusting - Social / Family: no family 7/6 at bedside   CC time 30 minutes  Kizzie Bane PCCM Pager: [phone] Cell: [phone] If no response, call [phone]

## 2012-02-11 NOTE — Progress Notes (Addendum)
Patient ID: [name], male   DOB: 01-Jan-1956, 56 y.o.   MRN: [id number]   [facility] Progress Note    Subjective:   No acute events noted overnight   Objective:   BP 138/69  Pulse 78  Temp 98.7 F (37.1 C) (Oral)  Resp 16  Ht 6' (1.829 m)  Wt 70 kg (154 lb 5.2 oz)  BMI 20.93 kg/m2  SpO2 100%  Physical Exam: WUJ:WJXBJYNWG, awakens to voice/touch NFA:OZHYQ RRR, S1 and S2 with ESM Resp:CTA/mechanical breath sounds bilaterally- no rales/rhonchi  MVH:QION, flat, NT, BS normal Ext: Trace-1+ LE edema  Labs: BMET  Lab 02/11/12 0407 02/10/12 0430 02/08/12 1914 02/08/12 0558 02/07/12 0530 02/06/12 0545 02/05/12 0406  NA 133* 136 137 139 139 137 138  K 4.2 4.1 4.6 4.1 3.7 3.6 4.1  CL 97 101 99 99 99 99 99  CO2 22 24 27 25 28 28 28   GLUCOSE 96 95 113* 97 104* 98 96  BUN 40* 28* 15 32* 21 25* 23  CREATININE 6.55* 5.24* 2.99* 5.18* 3.42* 3.20* 3.01*  ALB -- -- -- -- -- -- --  CALCIUM 9.1 9.1 9.2 9.6 9.5 9.5 9.5  PHOS -- -- 3.8 -- 3.1 -- --   CBC  Lab 02/11/12 0407 02/10/12 0430 02/08/12 0558 02/07/12 0530  WBC 6.1 5.9 4.6 4.5  NEUTROABS 3.7 3.8 -- --  HGB 8.3* 7.5* 8.5* 8.1*  HCT 25.4* 23.8* 25.8* 24.9*  MCV 97.7 99.2 98.5 98.4  PLT 282 235 198 169    Medications:      . antiseptic oral rinse  15 mL Mouth Rinse BID  . antiseptic oral rinse  15 mL Mouth Rinse QID  . ceFEPime (MAXIPIME) IV  2 g Intravenous Q T,Th,Sat-1800  . chlorhexidine  15 mL Mouth Rinse BID  . clonazePAM  1 mg Oral TID  . cloNIDine  0.3 mg Transdermal Weekly  . famotidine  20 mg Oral Daily  . feeding supplement (NEPRO CARB STEADY)  1,000 mL Per Tube Q24H  . feeding supplement  30 mL Per Tube TID  . lisinopril  10 mg Oral Daily  . multivitamin  1 tablet Oral QHS  . mupirocin cream  1 application Topical BID  . vancomycin  500 mg Oral Q6H  . vancomycin  750 mg Intravenous Q T,Th,Sa-HD     Assessment/ Plan:   1. VDRF- secondary to mucous plugging and s/p bronchoscopy-  Remains on FiO2 0.5 and anticipated to wean through the course of the day.  2. ESRD TTS/[facility]- dialysis today. Is below dry weight by 4 kg due to prolonged hospitalization. Will switch to D5% for hypoglycemia (getting DS1/2NS at 77mL/hr) 3. PNA/bacteremia/UTI/Cdiff- on PO Vancomycin and IV vancomycin/Cefepime 4. Alcohol abuse- chronic and relapsed multiple times after attempts at rehab/abstinence. On scheduled clonazepam for management of withdrawal 5. Anemia:/ACDz and ABLA= hgb 8.3, Increase aranesp to Q week- plan for PRBCs if <7 6. s/p PEA Arrest 7. HTN- on lisinopril and catapress patch with fair control- will monitor for drops during HD 8. Secondary Hyperparathyroidism=Ca=9.5 low albumin, Hold Zemplar. Low phos 3-4 range, no binders for now.   Zetta Bills, MD 02/11/2012, 7:50 AM

## 2012-02-12 ENCOUNTER — Inpatient Hospital Stay (HOSPITAL_COMMUNITY): Payer: Medicare Other

## 2012-02-12 LAB — CULTURE, RESPIRATORY W GRAM STAIN

## 2012-02-12 LAB — BLOOD GAS, ARTERIAL
Acid-Base Excess: 4.1 mmol/L — ABNORMAL HIGH (ref 0.0–2.0)
Bicarbonate: 28.3 mEq/L — ABNORMAL HIGH (ref 20.0–24.0)
Drawn by: 362741
O2 Content: 6 L/min
pCO2 arterial: 43.6 mmHg (ref 35.0–45.0)
pO2, Arterial: 68.2 mmHg — ABNORMAL LOW (ref 80.0–100.0)

## 2012-02-12 LAB — GLUCOSE, CAPILLARY
Glucose-Capillary: 88 mg/dL (ref 70–99)
Glucose-Capillary: 100 mg/dL — ABNORMAL HIGH (ref 70–99)
Glucose-Capillary: 82 mg/dL (ref 70–99)

## 2012-02-12 MED ORDER — HEPARIN SODIUM (PORCINE) 1000 UNIT/ML DIALYSIS
2000.0000 [IU] | INTRAMUSCULAR | Status: DC | PRN
  Administered 2012-02-13: 2000 [IU] via INTRAVENOUS_CENTRAL
  Filled 2012-02-12: qty 2

## 2012-02-12 MED ORDER — BIOTENE DRY MOUTH MT LIQD
15.0000 mL | Freq: Two times a day (BID) | OROMUCOSAL | Status: DC
  Administered 2012-02-12 – 2012-02-16 (×7): 15 mL via OROMUCOSAL

## 2012-02-12 MED ORDER — CHLORHEXIDINE GLUCONATE 0.12 % MT SOLN
15.0000 mL | Freq: Two times a day (BID) | OROMUCOSAL | Status: DC
  Administered 2012-02-12 – 2012-02-16 (×9): 15 mL via OROMUCOSAL
  Filled 2012-02-12 (×10): qty 15

## 2012-02-12 MED ORDER — FENTANYL CITRATE 0.05 MG/ML IJ SOLN
25.0000 ug | INTRAMUSCULAR | Status: DC | PRN
  Administered 2012-02-12 (×3): 25 ug via INTRAVENOUS
  Filled 2012-02-12 (×4): qty 2

## 2012-02-12 MED ORDER — LORAZEPAM 2 MG/ML IJ SOLN
1.0000 mg | Freq: Once | INTRAMUSCULAR | Status: AC
  Administered 2012-02-12: 1 mg via INTRAVENOUS
  Filled 2012-02-12: qty 1

## 2012-02-12 MED ORDER — BIOTENE DRY MOUTH MT LIQD: 15.0000 mL | Freq: Two times a day (BID) | OROMUCOSAL | Status: DC

## 2012-02-12 NOTE — Procedures (Signed)
Extubation Procedure Note  Patient Details:   Name: [name] DOB: 23-Feb-1956 MRN: [id number]   Airway Documentation:     Evaluation  O2 sats: stable throughout Complications: No apparent complications Patient did tolerate procedure well. Bilateral Breath Sounds: Rhonchi Suctioning: Airway Patient able to speak Patient self extubated to 6L [place]. MD assessed patient at bedside, no distress or stridor noted.   Derryl [place] 02/12/2012, 5:43 AM

## 2012-02-12 NOTE — Progress Notes (Signed)
RT placed pt on Bipap @ 1325. After RT and multiple RN's attempting to keep pt on bipap, pt placed back on nasal cannula. Pt would pull at the mask and attempt to get it off. Pt on Nasal Cannul and tolerating well.

## 2012-02-12 NOTE — Progress Notes (Addendum)
Patient ID: [name], male   DOB: [birth year], 56 y.o.   MRN: [id number]   [facility] Progress Note    Subjective:   Self-extubated this morning- remains off vent. Voices no emerging complaints   Objective:   BP 180/101  Pulse 118  Temp 98.9 F (37.2 C) (Oral)  Resp 26  Ht 6' (1.829 m)  Wt 67.4 kg (148 lb 9.4 oz)  BMI 20.15 kg/m2  SpO2 98%  Physical Exam: WUJ:WJXBJYNW aand anxious resting in bed GNF:AOZHY regular tachycardia, S1 and S2 with ESM Resp:Coarse BS bilaterally- no rales/wheeze (On O2 via [place]) QMV:HQIO, flat, NT, BS normal Ext:No LE edema  Labs: BMET  Lab 02/11/12 0407 02/10/12 0430 02/08/12 1914 02/08/12 0558 02/07/12 0530 02/06/12 0545  NA 133* 136 137 139 139 137  K 4.2 4.1 4.6 4.1 3.7 3.6  CL 97 101 99 99 99 99  CO2 22 24 27 25 28 28   GLUCOSE 96 95 113* 97 104* 98  BUN 40* 28* 15 32* 21 25*  CREATININE 6.55* 5.24* 2.99* 5.18* 3.42* 3.20*  ALB -- -- -- -- -- --  CALCIUM 9.1 9.1 9.2 9.6 9.5 9.5  PHOS -- -- 3.8 -- 3.1 --   CBC  Lab 02/11/12 0407 02/10/12 0430 02/08/12 0558 02/07/12 0530  WBC 6.1 5.9 4.6 4.5  NEUTROABS 3.7 3.8 -- --  HGB 8.3* 7.5* 8.5* 8.1*  HCT 25.4* 23.8* 25.8* 24.9*  MCV 97.7 99.2 98.5 98.4  PLT 282 235 198 169    Medications:      . antiseptic oral rinse  15 mL Mouth Rinse BID  . antiseptic oral rinse  15 mL Mouth Rinse QID  . chlorhexidine  15 mL Mouth Rinse BID  . clonazePAM  1 mg Oral TID  . cloNIDine  0.3 mg Transdermal Weekly  . darbepoetin      . darbepoetin (ARANESP) injection - DIALYSIS  100 mcg Intravenous Q Tue-HD  . famotidine  20 mg Oral Daily  . feeding supplement (NEPRO CARB STEADY)  1,000 mL Per Tube Q24H  . feeding supplement  30 mL Per Tube TID  . heparin subcutaneous  5,000 Units Subcutaneous Q8H  . lisinopril  10 mg Oral Daily  . LORazepam  1 mg Intravenous Once  . meropenem (MERREM) IV  500 mg Intravenous NOW  . meropenem (MERREM) IV  500 mg Intravenous Q24H  . multivitamin  1  tablet Oral QHS  . mupirocin cream  1 application Topical BID  . vancomycin  500 mg Oral Q6H  . DISCONTD: ceFEPime (MAXIPIME) IV  2 g Intravenous Q T,Th,Sat-1800  . DISCONTD: vancomycin  750 mg Intravenous Q T,Th,Sa-HD     Assessment/ Plan:   1. VDRF- secondary to mucous plugging and s/p bronchoscopy-Patient inadvertently self-extubated himself this morning, however, was able to remain extubated after bridging on NRB/O2 via  at 6L/min. Lung exam still suggestive of secretions in airways and will need close monitoring for compromise- ?nebs atrovent 2. ESRD TTS/[facility]- dialysis tomorrow. Weight loss from prolonged hospitalization with decreased EDW. DC D5% if started on PO diet. 3. PNA/bacteremia/UTI/Cdiff- on PO Vancomycin for C diff colitis and Meropenem for HCAP 4. Alcohol abuse- chronic and relapsed multiple times after attempts at rehab/abstinence. On scheduled clonazepam for management of withdrawal 5. Anemia:/ACDz and ABLA= hgb 8.3 yesterday, On aranesp to Q week- plan for PRBCs if <7 6. s/p PEA Arrest 7. HTN- on lisinopril and catapress patch with fair control- will monitor for drops during  HD 8. Secondary Hyperparathyroidism=Ca=9.5 low albumin, Hold Zemplar. Recheck phosphorus tomorrow, no binders for now (restart when on POs).  Zetta Bills, MD 02/12/2012, 7:46 AM

## 2012-02-12 NOTE — Progress Notes (Signed)
RT called to room due to patient self dislodgement.. RN at bedside, MD notified and verbally ordered to extubate over ELink. Patient placed on 6L nasal cannula. No stridor. SpO2 97 CCM MD assesed patient at bedside. RT will continue to monitor.

## 2012-02-12 NOTE — Progress Notes (Signed)
PULMONARY / CRITICAL CARE MEDICINE  BRIEF 56 year old male with PMH of HTN, COPD, Hep C, polysubstance abuse, chronic recurrent pancreatitis, PUD and ESRD. He initially called EMS from home complaining of chest pain. At arrival EMS found him in PEA arrest. He was intubated in the field, advanced CPR was initiated and return to spontaneous circulation was obtained after 5 to 6 minutes. Initial EKG showed peaked T waves and K+ of 6.4. At arrival was unresponsive to painful stimuli. Patient reportedly had missed dialysis for the week PTA.  KEY SOCIAL HX  Patient's sister, [name] [phone]). Patient has brothers as well. [name] involved and admitted to patient problems with non compliance and substance abuse  EVENTS  6/25 - increasing pressors, fluid bolus for low bp, hypoglycemic requiring D10  6/26 - agitation, some bleeding from upper airway, diarrhea overnight--> flexiseal  6/28 - Severe agitation requiring increased fent gtt overnight, Transfused 1 u on HD  CT abd - fracture rt 6-8 ribs, splenomegaly, LLL consolidation  6/28 - precedex/ klonopin added with decreased fent gtt  7/01 - Not weanable due to agitation on WUA  7/2 - improved, extubated  7/3 - Tx to IM TS  7/6 - Hypertensive, tachycardic, respiratory distress, intubated  7/7- bronch for collapse  7/8- did not recollapse 7/9-self extubation   S: Called to evaluate patient after self-extubation. Patient was reportedly found with ETT dislodged . An attempt was made to move the tube back into position but emesis occurred and patient unable to keep ETT down.   O: BP 157/89  Pulse 107  Temp 98.9 F (37.2 C) (Oral)  Resp 24  Ht 6' (1.829 m)  Wt 151 lb 3.8 oz (68.6 kg)  BMI 20.51 kg/m2  SpO2 100% Gen: anxious appearing, restless, alert, initially on nonrebreather Neuro: disoriented, confused Pulm: coarse bilateral breath sounds, air movement equal. Abdominal breathing noted and tachypneic ranging from 20-30. No  retractions noted.  CV: Regular rhythm, tachycardic to 120s GI: soft, nondistended Ext: warm well perfused, no edema  A/P:   1. Self extubation-patient in some respiratory distress given abdominal breathing and tachypnea-could also be related to anxiety from self extubating. Patient was weaned from nonrebreather to 6L [place] while I was in the room and was able to maintain saturations.   -no emergent need for reintubation-will monitor.   -ABG 1 hour post intubation  2. Concern for aspiration-obtaining CXR at this time.   3. Anxiety/agitation-patient already on klonopin scheduled. Will give 1x dose of ativan.   4. HTN- Patient is also hypertensive to approximately 200/100 so hope is that blood pressure will respond when patient less agitated  as well.  Tana Conch, MD, PGY2 02/12/2012 5:51 AM

## 2012-02-12 NOTE — Progress Notes (Signed)
Name: [name] MRN: [id number] DOB: [birth year]    LOS: 17 Date of admit 01/26/2012  5:09 PM    PULMONARY / CRITICAL CARE MEDICINE  BRIEF  56 year old male with PMH of HTN, COPD, Hep C, polysubstance abuse, chronic recurrent pancreatitis, PUD and ESRD. He initially called EMS from home complaining of chest pain. At arrival EMS found him in PEA arrest. He was intubated in the field, advanced CPR was initiated and return to spontaneous circulation was obtained after 5 to 6 minutes. Initial EKG showed peaked T waves and K+ of 6.4. At arrival was unresponsive to painful stimuli. Patient reportedly had missed dialysis for the week PTA.   KEY SOCIAL HX Patient's sister, [name] [phone]). Patient has brothers as well. [name]  Most involved and admitted to patient problems with non compliance and substance abuse   EVENTS 6/25 - increasing pressors, fluid bolus for low bp, hypoglycemic requiring D10 6/26 - agitation, some bleeding from upper airway, diarrhea overnight--> flexiseal 6/28 - Severe agitation requiring increased fent gtt overnight, Transfused 1 u on HD            CT abd  - fracture rt 6-8 ribs, splenomegaly, LLL consolidation 6/28 - precedex/ klonopin added with decreased fent gtt 7/01 - Not weanable due to agitation on WUA 7/2 - improved, extubated 7/3 - Tx to IM TS 7/6 - Hypertensive, tachycardic, respiratory distress, intubated 7/7- bronch for collapse 7/8- did not recollapse 7/9- self extubated, marginal  SUBJECTIVE/OVERNIGHT/INTERVAL HX improved  Vital Signs: Temp:  [97.8 F (36.6 C)-99.3 F (37.4 C)] 98.9 F (37.2 C) (07/10 0400) Pulse Rate:  [74-119] 118  (07/10 0600) Resp:  [7-28] 26  (07/10 0600) BP: (104-206)/(51-101) 180/101 mmHg (07/10 0600) SpO2:  [82 %-100 %] 98 % (07/10 0600) FiO2 (%):  [40 %] 40 % (07/10 0400) Weight:  [67.4 kg (148 lb 9.4 oz)-70.1 kg (154 lb 8.7 oz)] 67.4 kg (148 lb 9.4 oz) (07/10 0500)  Physical Examination: Gen:  awake, mild distress, rass 1 HEENT: NCAT PULM: coarse, reduced bases CV; RRR, systolic murmur Ab: BS+, soft, nontender Ext: warm, no edema Neuro: awake, speaking  ASSESSMENT AND PLAN:    Patient Active Problem List  Diagnosis  . SUBSTANCE ABUSE, MULTIPLE  . PEPTIC ULCER DISEASE  . Chronic recurrent pancreatitis  . End stage renal disease on dialysis  . Alcohol abuse  . COPD (chronic obstructive pulmonary disease)  . Neuropathy  . Protein malnutrition  . PEA (Pulseless electrical activity)  . Acute respiratory failure with hypoxia  . Hyperkalemia  . Bacteremia due to Pseudomonas  . Sepsis due to Streptococcus, group B  . C. difficile colitis  . Anemia  . Thrombocytopenia     PULMONARY  Lab 02/12/12 0615 02/10/12 0440 02/09/12 1015 02/08/12 1806 02/08/12 1537  PHART 7.427 7.389 7.449 7.487* 7.480*  PCO2ART 43.6 41.0 40.6 39.0 39.5  PO2ART 68.2* 82.4 61.0* 74.0* 53.0*  HCO3 28.3* 24.2* 28.3* 29.6* 29.4*  O2SAT 93.8 98.2 92.0 96.0 89.0    CXR: 7/10- aeration bases, bibasilr atx ETT:  7/1>>>7/3            7/6>>>  A:   Respiratory failure secondary to cardiac arrest>>>resolved.  New failure on 7/6 collapse History of COPD Respiratory and metabolic acidosis - resolved LLL Aspiration pna d/t pseudomonas, improved  Fracture rt 6-8 ribs with cpr Self extubated 7/9  P:   -pcxr reviewed, some aeration bases -may require reintubation, need to control splinting / pain and  encourage pulm toilet -Add NIMV 2 hours on 4 hours off, if tolerates -narcotics to control pain, IS -secretions control important -flutter -upright position -pcxr in am  -chest pt to left dc in 24 hrs -abg reviewed -dont see flale segment on exam, may require Ct chest  CARDIOVASCULAR  Lab 02/11/12 1607 02/11/12 1010 02/11/12 0407 02/10/12 2207 02/10/12 1555  TROPONINI <0.30 <0.30 <0.30 <0.30 <0.30  LATICACIDVEN -- -- -- -- --  PROBNP -- -- -- -- --   ECG:  Peak T waves, Sinus rhythm at  admit on 6/23 -> normalized 6/24 Lines: Left IJ CVL 6/24 >> 7/3              A:  PEA arrest. Due to  metabolic derrangements secondary to non compliance with dialysis and sepsis -  septic/ cardiogenic shock --resolved  History of hypertension -BP significantly elevated Mild bump in troponin since admit probably secondary to sepsis.    P:  -PRN labetalol -tele -treat resp failure  RENAL  Lab 02/11/12 0407 02/10/12 0430 02/08/12 1914 02/08/12 0558 02/07/12 0530  NA 133* 136 137 139 139  K 4.2 4.1 -- -- --  CL 97 101 99 99 99  CO2 22 24 27 25 28   BUN 40* 28* 15 32* 21  CREATININE 6.55* 5.24* 2.99* 5.18* 3.42*  CALCIUM 9.1 9.1 9.2 9.6 9.5  MG -- -- -- -- --  PHOS -- -- 3.8 -- 3.1   Intake/Output      07/09 0701 - 07/10 0700 07/10 0701 - 07/11 0700   I.V. (mL/kg) 1782.9 (26.5)    NG/GT 390    IV Piggyback 50    Total Intake(mL/kg) 2222.9 (33)    Other 3500    Stool 200    Total Output 3700    Net -1477.1          Foley:  6/23 >> out  A:   ESRD on hemodialysis Hyperkalemia, hyponatremia at admit on 01/26/2012. S/p emergent HD on 01/26/12.     P:   -HD per Nephrology (last 7/9, neg) Per renal  GASTROINTESTINAL  Lab 02/11/12 0407 02/08/12 1914 02/07/12 0530  AST 33 -- --  ALT 16 -- --  ALKPHOS 143* -- --  BILITOT 0.4 -- --  PROT 8.0 -- --  ALBUMIN 1.9* 2.3* 1.9*    A:  History of PUD, CDIFF Colitis with pos C Diff PCR  P:   -oral vanco -->see ID -NPO , until resp status improved  HEMATOLOGIC  Lab 02/11/12 0407 02/10/12 0430 02/08/12 0558 02/07/12 0530 02/06/12 0545  HGB 8.3* 7.5* 8.5* 8.1* 8.3*  HCT 25.4* 23.8* 25.8* 24.9* 26.3*  PLT 282 235 198 169 167  INR -- -- -- -- --  APTT -- -- -- -- --   A:  Anemic of critical illness / Thrombocytopenia  Stable, PLT improving  P:  Add sub q hep, high risk dvt hemoconcentration after hd  INFECTIOUS  Lab 02/11/12 0407 02/10/12 0430 02/08/12 1913 02/08/12 0558 02/07/12 0530 02/06/12 0545  WBC 6.1 5.9 --  4.6 4.5 7.0  PROCALCITON -- -- 3.11 -- -- --   Cultures:  6/23 urine >> strep agalactiae 6/23 resp >>strep agalactiae 6/23 bld >> pseudomonas, strep agalactiae 6/26 C diff pCR POS 6/27 blood X 2  >> neg  7/6 Sputum >>>rare candida 7/7 BAL bronch>>>few speudomonas, pansens  ABx   Pip/ Tazo 6/23 >> 6/27 cipro 6/23 >>6/27 vanc 6/23 >> 6/27 Cefepime 6/27 (pseudomonas, Strep agalactiae)>>7/9 clinical failure?  Meropenem 7/9>>> PO vanc 6/27 (CDIFF)>> Flagyl 6/27 (CDIFF)>>7/3 Vanc (HCAP) >>  A:   Pseudomonas sepsis, d/t LLL PNA d/t pseudomonas History of Hep C Group B Strep Agalactiae bacteremia C. Diff colitis Mucus plug, likely HCAP on 7/6  P:   -Clinical failure to cefipime, with collapse, concner to limit duration after 13 days already of abx with cdiff- will plan 7 days meropenem -will have to limit course abx in setting cdiff  ENDOCRINE  Lab 02/12/12 0618 02/11/12 2328 02/11/12 1833 02/11/12 1156 02/11/12 0806  GLUCAP 88 98 98 100* 80   A:   Hypoglycemia - resolved  P:   -check CBG's Q6 -add D5 maintenence while NPO  NEUROLOGIC  A:  Encephalopathy - agitated delirium, h/o polysubstance abuse  P:   -Klonopin  -fent q1h for chest pain fx, splinting  BEST PRACTICE / DISPOSITION - Level of Care:  ICU - Primary Service:  IMTS - Consultants:  Nephrology, ID - Code Status:  Full code - Diet:  TF 7/8 - DVT Px:  SCDs - GI Px: H2 - Skin Integrity:  Multiple open ulcerations on legs with crusting - Social / Family: no family 7/6 at bedside  CC time 30 minutes  Kizzie Bane PCCM Pager: [phone] Cell: [phone] If no response, call [phone]

## 2012-02-12 NOTE — Progress Notes (Signed)
02/12/2012 0500  Pt dislodged ETT. E-link MD called. Extubated and bagged.  Pt became more stable with resp. Placed on 100% NRB with sats 100%  Weaned to 6 L [place]. Resident came and evaluated PT.  Very congested. Able to vocalize and respond to questions appro.  [employer], Linnell Fulling

## 2012-02-13 ENCOUNTER — Inpatient Hospital Stay (HOSPITAL_COMMUNITY): Payer: Medicare Other

## 2012-02-13 LAB — BASIC METABOLIC PANEL
CO2: 23 mEq/L (ref 19–32)
GFR calc non Af Amer: 11 mL/min — ABNORMAL LOW (ref >90)
Glucose, Bld: 98 mg/dL (ref 70–99)
Potassium: 3.5 mEq/L (ref 3.5–5.1)
Sodium: 128 mEq/L — ABNORMAL LOW (ref 135–145)

## 2012-02-13 LAB — CBC
HCT: 25 % — ABNORMAL LOW (ref 39.0–52.0)
MCH: 31.5 pg (ref 26.0–34.0)
MCHC: 33.6 g/dL (ref 30.0–36.0)
RDW: 14.7 % (ref 11.5–15.5)

## 2012-02-13 LAB — GLUCOSE, CAPILLARY
Glucose-Capillary: 106 mg/dL — ABNORMAL HIGH (ref 70–99)
Glucose-Capillary: 94 mg/dL (ref 70–99)

## 2012-02-13 MED ORDER — FLUCONAZOLE 200 MG PO TABS
200.0000 mg | ORAL_TABLET | Freq: Once | ORAL | Status: AC
  Administered 2012-02-13: 200 mg via ORAL
  Filled 2012-02-13: qty 1

## 2012-02-13 MED ORDER — FLUCONAZOLE 100 MG PO TABS
100.0000 mg | ORAL_TABLET | Freq: Every day | ORAL | Status: DC
  Administered 2012-02-14 – 2012-02-15 (×2): 100 mg via ORAL
  Filled 2012-02-13 (×3): qty 1

## 2012-02-13 MED ORDER — DOPAMINE-DEXTROSE 3.2-5 MG/ML-% IV SOLN
INTRAVENOUS | Status: AC
  Filled 2012-02-13: qty 250

## 2012-02-13 NOTE — Progress Notes (Signed)
Nutrition Follow-up  Intervention:   1. Diet advancements when medically able  2. Nepro shakes BID once diet advances, each 8 oz provides 425 kcal and 19 gm.  3. RD will continue to follow    Assessment:   Pt self extubated on 7/9. Per notes, remains NPO until respiratory status improves. Planned for HD today, up to unit? Pt with continued weight loss, likely related to Hd but also with limited intake from stopped/held TF and NPO diets.   RN reports pt being transferred to stepdown, swallow eval ordered. States he has been doing well with applesauce and thin liquids for meds today. Anticipates diet advance soon.   Diet Order:  NPO TF: none- d/c'd after extubation  Meds: Scheduled Meds:   . antiseptic oral rinse  15 mL Mouth Rinse q12n4p  . chlorhexidine  15 mL Mouth Rinse BID  . clonazePAM  1 mg Oral TID  . cloNIDine  0.3 mg Transdermal Weekly  . darbepoetin (ARANESP) injection - DIALYSIS  100 mcg Intravenous Q Tue-HD  . famotidine  20 mg Oral Daily  . fluconazole  200 mg Oral Once   Followed by  . fluconazole  100 mg Oral Daily  . heparin subcutaneous  5,000 Units Subcutaneous Q8H  . lisinopril  10 mg Oral Daily  . meropenem (MERREM) IV  500 mg Intravenous Q24H  . multivitamin  1 tablet Oral QHS  . mupirocin cream  1 application Topical BID  . vancomycin  500 mg Oral Q6H  . DISCONTD: antiseptic oral rinse  15 mL Mouth Rinse BID  . DISCONTD: antiseptic oral rinse  15 mL Mouth Rinse QID  . DISCONTD: antiseptic oral rinse  15 mL Mouth Rinse BID  . DISCONTD: chlorhexidine  15 mL Mouth Rinse BID  . DISCONTD: feeding supplement (NEPRO CARB STEADY)  1,000 mL Per Tube Q24H  . DISCONTD: feeding supplement  30 mL Per Tube TID   Continuous Infusions:   . dextrose 50 mL/hr at 02/12/12 2127   PRN Meds:.sodium chloride, acetaminophen, albuterol, fentaNYL, heparin, heparin, ipratropium, labetalol, lidocaine, lidocaine-prilocaine, ondansetron (ZOFRAN) IV,  pentafluoroprop-tetrafluoroeth  Labs:  CMP     Component Value Date/Time   NA 128* 02/13/2012 0500   K 3.5 02/13/2012 0500   CL 92* 02/13/2012 0500   CO2 23 02/13/2012 0500   GLUCOSE 98 02/13/2012 0500   BUN 26* 02/13/2012 0500   CREATININE 5.28* 02/13/2012 0500   CREATININE 8.17* 10/15/2011 1709   CALCIUM 8.8 02/13/2012 0500   CALCIUM 7.6* 04/04/2011 0315   PROT 8.0 02/11/2012 0407   ALBUMIN 1.9* 02/11/2012 0407   AST 33 02/11/2012 0407   ALT 16 02/11/2012 0407   ALKPHOS 143* 02/11/2012 0407   BILITOT 0.4 02/11/2012 0407   GFRNONAA 11* 02/13/2012 0500   GFRAA 13* 02/13/2012 0500     Intake/Output Summary (Last 24 hours) at 02/13/12 1016 Last data filed at 02/13/12 0700  Gross per 24 hour  Intake   1135 ml  Output      0 ml  Net   1135 ml    Weight Status:  149 lbs, overall trending down  Admission weight: 162 lbs  Re-estimated needs:  2100-2300 kcal, 85-95 gm protein  Nutrition Dx:  Inadequate oral intake, ongoing   Goal:  TF while pt intubated, no longer applicable  New Goal: Diet advance to meet nutrition needs  Monitor:  Diet advance, weight, labs, HD   Clarene Duke RD, LDN Pager [phone] After Hours pager [phone]

## 2012-02-13 NOTE — Progress Notes (Signed)
Pt. Has not tolerated the bipap that well. Pt. Has pulled his mask off numerous times. RN placed pt. On 5L nasal cannula for now.

## 2012-02-13 NOTE — Progress Notes (Signed)
Patient currently off BIPAP, on 5LNC with 02 saturations 96%. CPT done manually to left side, flutter also done as well. Spontaneous cough. Patient tolerated well, no complications

## 2012-02-13 NOTE — Progress Notes (Signed)
Patient ID: [name], male   DOB: [birth year], 56 y.o.   MRN: [id number]   [facility] Progress Note    Subjective:   On BIPAP overnight (with difficulty as patient took mask off) and now maintained on O2 via [place]. He currently complaints of some shortness of breath. RN reports 'yeast-like" rash over buttocks   Objective:   BP 185/89  Pulse 92  Temp 98.9 F (37.2 C) (Oral)  Resp 27  Ht 6' (1.829 m)  Wt 67.7 kg (149 lb 4 oz)  BMI 20.24 kg/m2  SpO2 97%  Physical Exam: JXB:[id number] resting in bed- visibly dyspneic MVH:QIONG regular tachycardia, normal s1 and s2  Resp:Coarse BS bilaterally, no rales EXB:MWUX, flat, NT, BS normal Ext:No LE edema  Labs: BMET  Lab 02/13/12 0500 02/11/12 0407 02/10/12 0430 02/08/12 1914 02/08/12 0558 02/07/12 0530  NA 128* 133* 136 137 139 139  K 3.5 4.2 4.1 4.6 4.1 3.7  CL 92* 97 101 99 99 99  CO2 23 22 24 27 25 28   GLUCOSE 98 96 95 113* 97 104*  BUN 26* 40* 28* 15 32* 21  CREATININE 5.28* 6.55* 5.24* 2.99* 5.18* 3.42*  ALB -- -- -- -- -- --  CALCIUM 8.8 9.1 9.1 9.2 9.6 9.5  PHOS -- -- -- 3.8 -- 3.1   CBC  Lab 02/11/12 0407 02/10/12 0430 02/08/12 0558 02/07/12 0530  WBC 6.1 5.9 4.6 4.5  NEUTROABS 3.7 3.8 -- --  HGB 8.3* 7.5* 8.5* 8.1*  HCT 25.4* 23.8* 25.8* 24.9*  MCV 97.7 99.2 98.5 98.4  PLT 282 235 198 169    Medications:      . antiseptic oral rinse  15 mL Mouth Rinse q12n4p  . chlorhexidine  15 mL Mouth Rinse BID  . clonazePAM  1 mg Oral TID  . cloNIDine  0.3 mg Transdermal Weekly  . darbepoetin (ARANESP) injection - DIALYSIS  100 mcg Intravenous Q Tue-HD  . famotidine  20 mg Oral Daily  . heparin subcutaneous  5,000 Units Subcutaneous Q8H  . lisinopril  10 mg Oral Daily  . meropenem (MERREM) IV  500 mg Intravenous Q24H  . multivitamin  1 tablet Oral QHS  . mupirocin cream  1 application Topical BID  . vancomycin  500 mg Oral Q6H  . DISCONTD: antiseptic oral rinse  15 mL Mouth Rinse BID  .  DISCONTD: antiseptic oral rinse  15 mL Mouth Rinse QID  . DISCONTD: antiseptic oral rinse  15 mL Mouth Rinse BID  . DISCONTD: chlorhexidine  15 mL Mouth Rinse BID  . DISCONTD: feeding supplement (NEPRO CARB STEADY)  1,000 mL Per Tube Q24H  . DISCONTD: feeding supplement  30 mL Per Tube TID     Assessment/ Plan:   1. VDRF- secondary to mucous plugging and s/p bronchoscopy-Patient self-extubated himself yesterday and currently transitioned to O2 via [place] after NIPPV. Lung exam still suggestive of secretions in airways and will need close monitoring for respiratory compromise. No obvious edema noted on CXR and left lung re-expansion seems to be good. 2. ESRD TTS/[facility]- dialysis today (can likely go up to HD unit). Has significant weight loss from prolonged hospitalization with decreased EDW. DC D5% if started on PO diet. 3. PNA/bacteremia/UTI/Cdiff- on PO Vancomycin for C diff colitis and Meropenem for HCAP- afebrile with normal WBC counts 4. Alcohol abuse- chronic and relapsed multiple times after attempts at rehab/abstinence. On scheduled clonazepam for management of withdrawal 5. Anemia:/ACDz and ABLA= hgb 8.3 tuesday, On aranesp to  Q week- plan for PRBCs if <7 6. s/p PEA Arrest 7. HTN- on lisinopril and catapress patch with poor control- will monitor post-HD 8. Secondary Hyperparathyroidism=Ca=9.5 low albumin, Hold Zemplar. Recheck phosphorus today, no binders for now (restart when on consistent POs).   Zetta Bills, MD 02/13/2012, 7:39 AM

## 2012-02-13 NOTE — Progress Notes (Signed)
Name: [name] MRN: [id number] DOB: 1955/12/29    LOS: 18 Date of admit 01/26/2012  5:09 PM    PULMONARY / CRITICAL CARE MEDICINE  BRIEF  56 year old male with PMH of HTN, COPD, Hep C, polysubstance abuse, chronic recurrent pancreatitis, PUD and ESRD. He initially called EMS from home complaining of chest pain. At arrival EMS found him in PEA arrest. He was intubated in the field, advanced CPR was initiated and return to spontaneous circulation was obtained after 5 to 6 minutes. Initial EKG showed peaked T waves and K+ of 6.4. At arrival was unresponsive to painful stimuli. Patient reportedly had missed dialysis for the week PTA.   KEY SOCIAL HX Patient's sister, Garris Melhorn [phone]). Patient has brothers as well. Ms. Kiester  Most involved and admitted to patient problems with non compliance and substance abuse   EVENTS 6/25 - increasing pressors, fluid bolus for low bp, hypoglycemic requiring D10 6/26 - agitation, some bleeding from upper airway, diarrhea overnight--> flexiseal 6/28 - Severe agitation requiring increased fent gtt overnight, Transfused 1 u on HD            CT abd  - fracture rt 6-8 ribs, splenomegaly, LLL consolidation 6/28 - precedex/ klonopin added with decreased fent gtt 7/01 - Not weanable due to agitation on WUA 7/2 - improved, extubated 7/3 - Tx to IM TS 7/6 - Hypertensive, tachycardic, respiratory distress, intubated 7/7- bronch for collapse 7/8- did not recollapse 7/9- self extubated, marginal 7/10- improved resp status, NIMV  SUBJECTIVE/OVERNIGHT/INTERVAL HX improved  Vital Signs: Temp:  [97.4 F (36.3 C)-98.9 F (37.2 C)] 98.9 F (37.2 C) (07/11 0400) Pulse Rate:  [77-147] 92  (07/11 0700) Resp:  [18-29] 27  (07/11 0700) BP: (166-212)/(79-116) 185/89 mmHg (07/11 0700) SpO2:  [91 %-100 %] 97 % (07/11 0700) FiO2 (%):  [40 %] 40 % (07/11 0000) Weight:  [67.7 kg (149 lb 4 oz)] 67.7 kg (149 lb 4 oz) (07/11 0400)  Physical  Examination: Gen: awake, mild distress remains, improved HEENT: NCAT PULM: reduced bases CV; RRR, systolic murmur Ab: BS+, soft, nontender Ext: warm, no edema Neuro: awake, speaking  ASSESSMENT AND PLAN:    Patient Active Problem List  Diagnosis  . SUBSTANCE ABUSE, MULTIPLE  . PEPTIC ULCER DISEASE  . Chronic recurrent pancreatitis  . End stage renal disease on dialysis  . Alcohol abuse  . COPD (chronic obstructive pulmonary disease)  . Neuropathy  . Protein malnutrition  . PEA (Pulseless electrical activity)  . Acute respiratory failure with hypoxia  . Hyperkalemia  . Bacteremia due to Pseudomonas  . Sepsis due to Streptococcus, group B  . C. difficile colitis  . Anemia  . Thrombocytopenia     PULMONARY  Lab 02/12/12 0615 02/10/12 0440 02/09/12 1015 02/08/12 1806 02/08/12 1537  PHART 7.427 7.389 7.449 7.487* 7.480*  PCO2ART 43.6 41.0 40.6 39.0 39.5  PO2ART 68.2* 82.4 61.0* 74.0* 53.0*  HCO3 28.3* 24.2* 28.3* 29.6* 29.4*  O2SAT 93.8 98.2 92.0 96.0 89.0    CXR: 7/10- slight increasing atx/infiltrate rt greater left ETT:  7/1>>>7/3            7/6>>>7/10 self extub  A:   Respiratory failure secondary to cardiac arrest>>>resolved.  New failure on 7/6 collapse History of COPD Respiratory and metabolic acidosis - resolved LLL Aspiration pna d/t pseudomonas, improved  Fracture rt 6-8 ribs with cpr Self extubated 7/9  P:   -pcxr reviewed, repeat in am  -NIMV used minimally, dc -upright, push  PT -narcotics to control pain, IS -secretions control important -flutter -dont see flale segment on exam, may require Ct chest  CARDIOVASCULAR  Lab 02/11/12 1607 02/11/12 1010 02/11/12 0407 02/10/12 2207 02/10/12 1555  TROPONINI <0.30 <0.30 <0.30 <0.30 <0.30  LATICACIDVEN -- -- -- -- --  PROBNP -- -- -- -- --   ECG:  Peak T waves, Sinus rhythm at admit on 6/23 -> normalized 6/24 Lines: Left IJ CVL 6/24 >> 7/3              A:  PEA arrest. Due to  metabolic  derrangements secondary to non compliance with dialysis and sepsis -  septic/ cardiogenic shock --resolved  History of hypertension -BP significantly elevated Mild bump in troponin since admit probably secondary to sepsis.    P:  -tele  RENAL  Lab 02/13/12 0500 02/11/12 0407 02/10/12 0430 02/08/12 1914 02/08/12 0558 02/07/12 0530  NA 128* 133* 136 137 139 --  K 3.5 4.2 -- -- -- --  CL 92* 97 101 99 99 --  CO2 23 22 24 27 25  --  BUN 26* 40* 28* 15 32* --  CREATININE 5.28* 6.55* 5.24* 2.99* 5.18* --  CALCIUM 8.8 9.1 9.1 9.2 9.6 --  MG -- -- -- -- -- --  PHOS -- -- -- 3.8 -- 3.1   Intake/Output      07/10 0701 - 07/11 0700 07/11 0701 - 07/12 0700   P.O. 60    I.V. (mL/kg) 1175 (17.4)    NG/GT     IV Piggyback 50    Total Intake(mL/kg) 1285 (19)    Other     Stool     Total Output     Net +1285         Urine Occurrence 1 x     Foley:  6/23 >> out  A:   ESRD on hemodialysis Hyperkalemia, hyponatremia at admit on 01/26/2012. S/p emergent HD on 01/26/12.     P:   -HD per Nephrology , for today hd Per renal  GASTROINTESTINAL  Lab 02/11/12 0407 02/08/12 1914 02/07/12 0530  AST 33 -- --  ALT 16 -- --  ALKPHOS 143* -- --  BILITOT 0.4 -- --  PROT 8.0 -- --  ALBUMIN 1.9* 2.3* 1.9*    A:  History of PUD, CDIFF Colitis with pos C Diff PCR  P:   -oral vanco -->see ID -swallow eval  HEMATOLOGIC  Lab 02/11/12 0407 02/10/12 0430 02/08/12 0558 02/07/12 0530  HGB 8.3* 7.5* 8.5* 8.1*  HCT 25.4* 23.8* 25.8* 24.9*  PLT 282 235 198 169  INR -- -- -- --  APTT -- -- -- --   A:  Anemic of critical illness / Thrombocytopenia  Stable, PLT improving  P:  sub q hep  INFECTIOUS  Lab 02/11/12 0407 02/10/12 0430 02/08/12 1913 02/08/12 0558 02/07/12 0530  WBC 6.1 5.9 -- 4.6 4.5  PROCALCITON -- -- 3.11 -- --   Cultures:  6/23 urine >> strep agalactiae 6/23 resp >>strep agalactiae 6/23 bld >> pseudomonas, strep agalactiae 6/26 C diff pCR POS 6/27 blood X 2  >>  neg  7/6 Sputum >>>rare candida 7/7 BAL bronch>>>few speudomonas, pansens  ABx   Pip/ Tazo 6/23 >> 6/27 cipro 6/23 >>6/27 vanc 6/23 >> 6/27 Cefepime 6/27 (pseudomonas, Strep agalactiae)>>7/9 clinical failure? Meropenem 7/9>>> PO vanc 6/27 (CDIFF)>> Flagyl 6/27 (CDIFF)>>7/3 Vanc (HCAP) >>  A:   Pseudomonas sepsis, d/t LLL PNA d/t pseudomonas History of Hep C Group B Strep Agalactiae  bacteremia C. Diff colitis Mucus plug, likely HCAP on 7/6  P:   -Clinical failure to cefipime, with collapse, concner to limit duration after 13 days already of abx with cdiff- will plan 7 days meropenem -will have to limit course abx in setting cdiff, when able -follow pcxr rt in am , may prolong course if increasing>?  ENDOCRINE  Lab 02/13/12 0523 02/12/12 2324 02/12/12 1232 02/12/12 0618 02/11/12 2328  GLUCAP 106* 82 100* 88 98   A:   Hypoglycemia - resolved  P:   -check CBG's Q6 -add D5 maintenence while NPO  NEUROLOGIC  A:  Encephalopathy - agitated delirium, h/o polysubstance abuse  P:   -Klonopin  -fent q1h for chest pain fx, splinting pt  BEST PRACTICE / DISPOSITION - Level of Care:  ICU - Primary Service:  IMTS - Consultants:  Nephrology, ID - Code Status:  Full code - Diet:  TF 7/8 - DVT Px:  SCDs - GI Px: H2 - Skin Integrity:  - Social / Family: no family 7/6 at bedside  To sdu tele  Kizzie Bane PCCM Pager: [phone] Cell: [phone] If no response, call [phone]

## 2012-02-13 NOTE — Progress Notes (Signed)
PT Cancellation Note  Treatment cancelled today due to patient receiving procedure or test.  [name] 02/13/2012, 6:30 PM

## 2012-02-13 NOTE — Procedures (Signed)
Pt seen on HD.  Ap 70 Vp 260.  BP 222/116  Given labetalol.  On 4K.  Will dc variable sodium.

## 2012-02-13 NOTE — Progress Notes (Signed)
Placed pt on bipap 12/6 50% for increased wob/sob with accessory muscle use and worsening sats on 5lnc.  RN aware.  RT will continue to monitor pt.

## 2012-02-13 NOTE — Progress Notes (Signed)
Pt in HD, will defer swallow eval to tomorrow. Harlon Ditty, MA CCC-SLP [phone]

## 2012-02-14 ENCOUNTER — Inpatient Hospital Stay (HOSPITAL_COMMUNITY): Payer: Medicare Other

## 2012-02-14 LAB — GLUCOSE, CAPILLARY
Glucose-Capillary: 91 mg/dL (ref 70–99)
Glucose-Capillary: 105 mg/dL — ABNORMAL HIGH (ref 70–99)

## 2012-02-14 LAB — BLOOD GAS, ARTERIAL
Acid-Base Excess: 3.5 mmol/L — ABNORMAL HIGH (ref 0.0–2.0)
Bicarbonate: 27 mEq/L — ABNORMAL HIGH (ref 20.0–24.0)
Drawn by: 275531
O2 Content: 5 L/min
pCO2 arterial: 36.9 mmHg (ref 35.0–45.0)
pO2, Arterial: 61.3 mmHg — ABNORMAL LOW (ref 80.0–100.0)

## 2012-02-14 LAB — POCT I-STAT 3, ART BLOOD GAS (G3+)
Acid-Base Excess: 4 mmol/L — ABNORMAL HIGH (ref 0.0–2.0)
Bicarbonate: 28.5 mEq/L — ABNORMAL HIGH (ref 20.0–24.0)
O2 Saturation: 99 %
Patient temperature: 98.5
TCO2: 30 mmol/L (ref 0–100)

## 2012-02-14 MED ORDER — FENTANYL BOLUS VIA INFUSION
50.0000 ug | Freq: Four times a day (QID) | INTRAVENOUS | Status: DC | PRN
  Filled 2012-02-14: qty 100

## 2012-02-14 MED ORDER — CARVEDILOL 6.25 MG PO TABS
6.2500 mg | ORAL_TABLET | Freq: Two times a day (BID) | ORAL | Status: DC
  Administered 2012-02-14: 6.25 mg via ORAL
  Filled 2012-02-14 (×3): qty 1

## 2012-02-14 MED ORDER — SODIUM CHLORIDE 0.9 % IV SOLN
50.0000 ug/h | INTRAVENOUS | Status: DC
  Administered 2012-02-14: 200 ug/h via INTRAVENOUS
  Administered 2012-02-15: 220 ug/h via INTRAVENOUS
  Administered 2012-02-15 – 2012-02-17 (×4): 200 ug/h via INTRAVENOUS
  Administered 2012-02-18 (×3): 300 ug/h via INTRAVENOUS
  Administered 2012-02-19: 200 ug/h via INTRAVENOUS
  Filled 2012-02-14 (×16): qty 50

## 2012-02-14 MED ORDER — LISINOPRIL 20 MG PO TABS
20.0000 mg | ORAL_TABLET | Freq: Every day | ORAL | Status: DC
  Filled 2012-02-14 (×2): qty 1

## 2012-02-14 MED ORDER — NEPRO/CARBSTEADY PO LIQD
1000.0000 mL | ORAL | Status: DC
  Administered 2012-02-14 – 2012-02-17 (×5): 1000 mL
  Filled 2012-02-14 (×6): qty 1000

## 2012-02-14 MED ORDER — HEPARIN SODIUM (PORCINE) 1000 UNIT/ML DIALYSIS
40.0000 [IU]/kg | INTRAMUSCULAR | Status: DC | PRN
  Filled 2012-02-14: qty 3

## 2012-02-14 MED ORDER — MIDAZOLAM BOLUS VIA INFUSION
1.0000 mg | INTRAVENOUS | Status: DC | PRN
  Filled 2012-02-14: qty 2

## 2012-02-14 MED ORDER — FAMOTIDINE 20 MG PO TABS: 20.0000 mg | ORAL_TABLET | Freq: Every day | ORAL | Status: DC

## 2012-02-14 MED ORDER — ALBUTEROL SULFATE (5 MG/ML) 0.5% IN NEBU
2.5000 mg | INHALATION_SOLUTION | Freq: Four times a day (QID) | RESPIRATORY_TRACT | Status: DC
  Administered 2012-02-14 – 2012-02-18 (×18): 2.5 mg via RESPIRATORY_TRACT
  Filled 2012-02-14 (×16): qty 0.5

## 2012-02-14 MED ORDER — CARVEDILOL 6.25 MG PO TABS
6.2500 mg | ORAL_TABLET | Freq: Two times a day (BID) | ORAL | Status: DC
  Administered 2012-02-14 – 2012-02-21 (×11): 6.25 mg
  Filled 2012-02-14 (×17): qty 1

## 2012-02-14 MED ORDER — CLONAZEPAM 1 MG PO TABS
1.0000 mg | ORAL_TABLET | Freq: Three times a day (TID) | ORAL | Status: DC
  Administered 2012-02-14 – 2012-02-17 (×7): 1 mg
  Filled 2012-02-14 (×7): qty 1

## 2012-02-14 MED ORDER — SODIUM CHLORIDE 0.9 % IV SOLN
2.0000 mg/h | INTRAVENOUS | Status: DC
  Administered 2012-02-14: 6 mg/h via INTRAVENOUS
  Administered 2012-02-14: 4 mg/h via INTRAVENOUS
  Administered 2012-02-15: 8 mg/h via INTRAVENOUS
  Administered 2012-02-15 (×2): 6 mg/h via INTRAVENOUS
  Administered 2012-02-16: 4 mg/h via INTRAVENOUS
  Administered 2012-02-16: 2 mg/h via INTRAVENOUS
  Administered 2012-02-17 (×2): 4 mg/h via INTRAVENOUS
  Administered 2012-02-18: 8 mg/h via INTRAVENOUS
  Administered 2012-02-18: 4 mg/h via INTRAVENOUS
  Administered 2012-02-18 – 2012-02-19 (×2): 8 mg/h via INTRAVENOUS
  Administered 2012-02-19: 5 mg/h via INTRAVENOUS
  Administered 2012-02-20: 4 mg/h via INTRAVENOUS
  Administered 2012-02-20 (×2): 6 mg/h via INTRAVENOUS
  Administered 2012-02-21: 5 mg/h via INTRAVENOUS
  Filled 2012-02-14 (×20): qty 10

## 2012-02-14 MED ORDER — FAMOTIDINE 40 MG/5ML PO SUSR
20.0000 mg | Freq: Every day | ORAL | Status: DC
  Administered 2012-02-15 – 2012-02-21 (×7): 20 mg
  Filled 2012-02-14 (×7): qty 2.5

## 2012-02-14 MED ORDER — PRO-STAT SUGAR FREE PO LIQD
30.0000 mL | Freq: Three times a day (TID) | ORAL | Status: DC
  Administered 2012-02-14 – 2012-02-21 (×17): 30 mL via ORAL
  Filled 2012-02-14 (×23): qty 30

## 2012-02-14 MED ORDER — LISINOPRIL 20 MG PO TABS
20.0000 mg | ORAL_TABLET | Freq: Every day | ORAL | Status: DC
  Administered 2012-02-14: 20 mg via ORAL
  Filled 2012-02-14: qty 1

## 2012-02-14 NOTE — Progress Notes (Addendum)
Patient ID: [name], male   DOB: 1956/06/30, 56 y.o.   MRN: [id number]   [facility] Progress Note    Subjective:   Tolerated dialysis without problems yesterday. Awaiting swallow evaluation today. Hypertensive overnight. Denies any pain and states SOB better.   Objective:   BP 177/95  Pulse 103  Temp 98.3 F (36.8 C) (Oral)  Resp 30  Ht 6' (1.829 m)  Wt 65.8 kg (145 lb 1 oz)  BMI 19.67 kg/m2  SpO2 95%  Physical Exam: NWG:NFAOZHYQMVH resting in bed- sitter by bedside QIO:NGEXB Regular tachycardia, normal S1 and S2 Resp:Coarse BS bilaterally, no rales/wheeze MWU:XLKG, flat, NT, BS normal Ext:No LE edema  Labs: BMET  Lab 02/13/12 0500 02/11/12 0407 02/10/12 0430 02/08/12 1914 02/08/12 0558  NA 128* 133* 136 137 139  K 3.5 4.2 4.1 4.6 4.1  CL 92* 97 101 99 99  CO2 23 22 24 27 25   GLUCOSE 98 96 95 113* 97  BUN 26* 40* 28* 15 32*  CREATININE 5.28* 6.55* 5.24* 2.99* 5.18*  ALB -- -- -- -- --  CALCIUM 8.8 9.1 9.1 9.2 9.6  PHOS 7.4* -- -- 3.8 --   CBC  Lab 02/13/12 1205 02/11/12 0407 02/10/12 0430 02/08/12 0558  WBC 4.8 6.1 5.9 4.6  NEUTROABS -- 3.7 3.8 --  HGB 8.4* 8.3* 7.5* 8.5*  HCT 25.0* 25.4* 23.8* 25.8*  MCV 93.6 97.7 99.2 98.5  PLT 395 282 235 198    Medications:      . antiseptic oral rinse  15 mL Mouth Rinse q12n4p  . chlorhexidine  15 mL Mouth Rinse BID  . clonazePAM  1 mg Oral TID  . cloNIDine  0.3 mg Transdermal Weekly  . darbepoetin (ARANESP) injection - DIALYSIS  100 mcg Intravenous Q Tue-HD  . famotidine  20 mg Oral Daily  . fluconazole  200 mg Oral Once   Followed by  . fluconazole  100 mg Oral Daily  . heparin subcutaneous  5,000 Units Subcutaneous Q8H  . lisinopril  20 mg Oral Daily  . meropenem (MERREM) IV  500 mg Intravenous Q24H  . multivitamin  1 tablet Oral QHS  . mupirocin cream  1 application Topical BID  . vancomycin  500 mg Oral Q6H  . DISCONTD: lisinopril  10 mg Oral Daily     Assessment/ Plan:    1. VDRF- Resolved-secondary to mucous plugging and s/p bronchoscopy-Remains visibly dyspneic but not hypoxic. 2. ESRD TTS/[facility]- dialysis tomorrow (can likely go up to HD unit). Has significant weight loss from prolonged hospitalization with decreased EDW.  3. PNA/bacteremia/UTI/Cdiff- on PO Vancomycin for C diff colitis and Meropenem for HCAP- afebrile with normal WBC counts 4. Alcohol abuse- chronic and relapsed multiple times after attempts at rehab/abstinence. On scheduled clonazepam for management of withdrawal 5. Anemia:/ACDz and ABLA= hgb 8.4 yesterday, On aranesp to Q week- plan for PRBCs if <7 6. s/p PEA Arrest 7. HTN- on lisinopril and catapress patch with poor control- will increase lisinopril dose and start BID carvedilol       8.   Secondary Hyperparathyroidism=Ca=9.5 low albumin, Hold Zemplar. Restart binders when cleared to eat.   Zetta Bills, MD 02/14/2012, 7:56 AM

## 2012-02-14 NOTE — Progress Notes (Signed)
Nutrition Follow-up  Intervention:  1.  Enteral nutrition; resume Nepro at 10 mL/hr continuous once access obtained.  Advance by 10 mL q 4 hrs to 30 mL/hr goal. 2.  Supplements; Prostat TID  Nutrition provided per regimen as prescribed by RD:  1596 kcal, 103g protein, 523 mL free water which meets 100% of pt needs.  Assessment:   Pt self-extubated (7/9), unable to sustain and reintubated.  Pt previously maintained on Nepro with tolerance.  Patient is currently intubated on ventilator support.  BMI: 22.1  MV: 8.7 Temp: 36.9 C No diprivan.  Pt without access at this time, RN reports impending OGT placement.  Diet Order:  NPO  Meds: Scheduled Meds:   . albuterol  2.5 mg Nebulization Q6H  . antiseptic oral rinse  15 mL Mouth Rinse q12n4p  . carvedilol  6.25 mg Per Tube BID WC  . chlorhexidine  15 mL Mouth Rinse BID  . clonazePAM  1 mg Per Tube TID  . cloNIDine  0.3 mg Transdermal Weekly  . darbepoetin (ARANESP) injection - DIALYSIS  100 mcg Intravenous Q Tue-HD  . famotidine  20 mg Per Tube Daily  . fluconazole  100 mg Oral Daily  . heparin subcutaneous  5,000 Units Subcutaneous Q8H  . lisinopril  20 mg Per Tube Daily  . meropenem (MERREM) IV  500 mg Intravenous Q24H  . mupirocin cream  1 application Topical BID  . DISCONTD: carvedilol  6.25 mg Oral BID WC  . DISCONTD: clonazePAM  1 mg Oral TID  . DISCONTD: famotidine  20 mg Oral Daily  . DISCONTD: famotidine  20 mg Per Tube Daily  . DISCONTD: lisinopril  10 mg Oral Daily  . DISCONTD: lisinopril  20 mg Oral Daily  . DISCONTD: multivitamin  1 tablet Oral QHS   Continuous Infusions:   . fentaNYL infusion INTRAVENOUS    . midazolam (VERSED) infusion    . DISCONTD: dextrose 50 mL/hr at 02/12/12 2127   PRN Meds:.sodium chloride, acetaminophen, albuterol, fentaNYL, heparin, heparin, labetalol, lidocaine, lidocaine-prilocaine, midazolam, pentafluoroprop-tetrafluoroeth, DISCONTD: fentaNYL, DISCONTD: heparin, DISCONTD:  ipratropium, DISCONTD: ondansetron (ZOFRAN) IV  Labs:  CMP     Component Value Date/Time   NA 128* 02/13/2012 0500   K 3.5 02/13/2012 0500   CL 92* 02/13/2012 0500   CO2 23 02/13/2012 0500   GLUCOSE 98 02/13/2012 0500   BUN 26* 02/13/2012 0500   CREATININE 5.28* 02/13/2012 0500   CREATININE 8.17* 10/15/2011 1709   CALCIUM 8.8 02/13/2012 0500   CALCIUM 7.6* 04/04/2011 0315   PROT 8.0 02/11/2012 0407   ALBUMIN 1.9* 02/11/2012 0407   AST 33 02/11/2012 0407   ALT 16 02/11/2012 0407   ALKPHOS 143* 02/11/2012 0407   BILITOT 0.4 02/11/2012 0407   GFRNONAA 11* 02/13/2012 0500   GFRAA 13* 02/13/2012 0500     Intake/Output Summary (Last 24 hours) at 02/14/12 1602 Last data filed at 02/14/12 1400  Gross per 24 hour  Intake   1125 ml  Output   2745 ml  Net  -1620 ml    Weight Status:  145 lbs, continues to trend toward loss Admission wt: 162 lbs Dry wt: 69.5 kg  Re-estimated needs:  1590-1680 kcal, 100-120g protein  Nutrition Dx:  Inadequate oral intake, ongoing  Monitor:   1.  Enteral nutrition; initiation with tolerance 2.  Wt/wt change; deter further loss.  Monitor for significant change below estimated dry wt. 3.  Nutrition-related labs; pt on HD, to resume Renal formula.   Loyce Dys, MS  RD LDN Clinical Inpatient Dietitian Pager: [phone]

## 2012-02-14 NOTE — Progress Notes (Signed)
Name: [name] MRN: [id number] DOB: Sep 13, 1955    LOS: 19 Date of admit 01/26/2012  5:09 PM    PULMONARY / CRITICAL CARE MEDICINE 56 year old male with PMH of HTN, COPD, Hep C, polysubstance abuse, chronic recurrent pancreatitis, PUD and ESRD. He initially called EMS from home on 6/24 complaining of chest pain. At arrival EMS found him in PEA arrest. Intubated, got emergent HD for hyperkalemia, also treated for shock. Found to have pseudomonas PNA & bacteremia, also strep agalactiae in blood and urine. Treated w/ antibiotics. Respiratory status marginal. Required re-intubation on 7/6. Developed C diff on 6/26. Transferred to SDU 7/11 still had left sided atx.   KEY SOCIAL HX Patient's sister, Clayson Riling [phone]). Patient has brothers as well. Ms. Baldwin  Most involved and admitted to patient problems with non compliance and substance abuse  Procedures ETT:  7/1>>>7/3            7/6>>>7/10 self extub Lines: Left IJ CVL 6/24 >> 7/3   Culture data 6/23 urine >> strep agalactiae 6/23 resp >>strep agalactiae 6/23 bld >> pseudomonas, strep agalactiae 6/26 C diff pCR POS 6/27 blood X 2  >> neg 7/6 Sputum >>>rare candida 7/7 BAL bronch>>>few speudomonas, pansens ABx   Pip/ Tazo 6/23 >> 6/27 cipro 6/23 >>6/27 vanc 6/23 >> 6/27 Cefepime 6/27 (pseudomonas, Strep agalactiae)>>7/9 clinical failure? Meropenem 7/9>>> PO vanc 6/27 (CDIFF)>> Flagyl 6/27 (CDIFF)>>7/3 Vanc (HCAP) >>  EVENTS 6/25 - increasing pressors, fluid bolus for low bp, hypoglycemic requiring D10 6/26 - agitation, some bleeding from upper airway, diarrhea overnight--> flexiseal 6/28 - Severe agitation requiring increased fent gtt overnight, Transfused 1 u on HD            CT abd  - fracture rt 6-8 ribs, splenomegaly, LLL consolidation 6/28 - precedex/ klonopin added with decreased fent gtt 7/01 - Not weanable due to agitation on WUA 7/2 - improved, extubated 7/3 - Tx to IM TS 7/6 - Hypertensive,  tachycardic, respiratory distress, intubated 7/7- bronch for collapse 7/8- did not recollapse 7/9- self extubated, marginal 7/10- improved resp status, NIMV  SUBJECTIVE/OVERNIGHT/INTERVAL HX States that he is a little more SOB today.   Vital Signs: Temp:  [97.6 F (36.4 C)-100.7 F (38.2 C)] 98.3 F (36.8 C) (07/12 0719) Pulse Rate:  [88-120] 103  (07/12 0719) Resp:  [20-36] 30  (07/12 0719) BP: (162-213)/(84-116) 177/95 mmHg (07/12 0719) SpO2:  [83 %-100 %] 95 % (07/12 0719) Weight:  [63.9 kg (140 lb 14 oz)-66.9 kg (147 lb 7.8 oz)] 65.8 kg (145 lb 1 oz) (07/12 0000) 5 liters Physical Examination: Gen: awake, mild distress , speaks in 1 word phrases.  HEENT: NCAT PULM: diffuse rhonchi, decreased on left.  CV; RRR, systolic murmur Ab: BS+, soft, nontender Ext: warm, no edema Neuro: awake, speaking   Lab 02/13/12 0500 02/11/12 0407 02/10/12 0430  NA 128* 133* 136  K 3.5 4.2 4.1  CL 92* 97 101  CO2 23 22 24   BUN 26* 40* 28*  CREATININE 5.28* 6.55* 5.24*  GLUCOSE 98 96 95    Lab 02/13/12 1205 02/11/12 0407 02/10/12 0430  HGB 8.4* 8.3* 7.5*  HCT 25.0* 25.4* 23.8*  WBC 4.8 6.1 5.9  PLT 395 282 235    PCXR: decreased aeration, worsening consolidation on left.   ASSESSMENT AND PLAN:  *PEA (Pulseless electrical activity)-->resolved Active Problems:  End stage renal disease on dialysis  Alcohol abuse  Acute respiratory failure with hypoxia due to left sided atelectasis and collapse.  Hyperkalemia--->resolved  Bacteremia due to Pseudomonas  Sepsis due to Streptococcus, group B  C. difficile colitis  Anemia  Thrombocytopenia-->resolved   LLL Aspiration pna d/t pseudomonas, complicated by underlying COPD & Fracture rt 6-8 ribs with cpr. Aeration Worse on CXR.   P:   -upright, push PT -narcotics to control pain, IS -flutter, and IS, add IPPB -see ID section below -swallow eval (MBS pending)  History of hypertension -BP significantly elevated P:   -tele -cont home meds -HD per renal  ESRD on hemodialysis P:   -HD per Nephrology , for today hd -Per renal  A:  History of PUD P:   -H2 blocker  A:  Anemic of critical illness / Thrombocytopenia  Stable, PLT improving P:  sub q hep  A:   Pseudomonas sepsis, d/t LLL PNA d/t pseudomonas History of Hep C Group B Strep Agalactiae bacteremia C. Diff colitis -Clinical failure to cefipime, with collapse, Concern to limit duration after 13 days already of abx with cdiff--> however CXR still remarkably abnormal.  P:  -cont current oral vanc for C diff -for now cont merrem, had initially hoped to d/c after 7d, now d 3, will follow clinical status to determine course.   Hypoglycemia - resolved  P:   -check CBG's Q6 -add D5 maintenence while NPO  A:  Encephalopathy - agitated delirium, h/o polysubstance abuse  P:   -Klonopin  -fent q1h for chest pain fx, splinting pt  BEST PRACTICE / DISPOSITION - Level of Care: SDU - Primary Service:  IMTS - Consultants:  Nephrology, ID - Code Status:  Full code - Diet:  TF 7/8 - DVT Px:  SCDs - GI Px: H2 - Skin Integrity:  - Social / Family: no family 7/6 at bedside  To sdu tele  Joanell Rising PCCM Pager: [phone] Cell: [phone] If no response, call [phone]  Patient seen and examined, agree with above note.  I dictated the care and orders written for this patient under my direction.  Koren Bound, M.D. [phone]

## 2012-02-14 NOTE — Progress Notes (Signed)
Physical Therapy Treatment Patient Details Name: [name] MRN: [id number] DOB: 08-Oct-1955 Today's Date: 02/14/2012 Time: 4098-1191 PT Time Calculation (min): 23 min  PT Assessment / Plan / Recommendation Comments on Treatment Session  Patient s/p PEA and VDRF.  Will benefit from PT to address endurance and balance issues secondary to deconditioning.      Follow Up Recommendations  Skilled nursing facility;Supervision/Assistance - 24 hour    Barriers to Discharge        Equipment Recommendations  Defer to next venue    Recommendations for Other Services    Frequency Min 3X/week   Plan Discharge plan needs to be updated;Frequency needs to be updated    Precautions / Restrictions Precautions Precautions: Fall Precaution Comments: Contact isolation for C-diff Restrictions Weight Bearing Restrictions: No   Pertinent Vitals/Pain Overall VSS, No pain    Mobility  Bed Mobility Bed Mobility: Rolling Left;Left Sidelying to Sit;Sitting - Scoot to Edge of Bed Rolling Left: 1: +2 Total assist Rolling Left: Patient Percentage: 60% Left Sidelying to Sit: 1: +2 Total assist;HOB flat Left Sidelying to Sit: Patient Percentage: 60% Supine to Sit: Not tested (comment) Sitting - Scoot to Edge of Bed: 3: Mod assist Sit to Supine: Not Tested (comment) Scooting to [facility]: Not tested (comment) Details for Bed Mobility Assistance: cues for technique, assist for elevation of trunk Transfers Transfers: Sit to Stand;Stand to Sit;Stand Pivot Transfers Sit to Stand: 1: +2 Total assist;From elevated surface;With upper extremity assist;From bed Sit to Stand: Patient Percentage: 60% Stand to Sit: 1: +2 Total assist;With upper extremity assist;With armrests;To chair/3-in-1 Stand to Sit: Patient Percentage: 60% Stand Pivot Transfers: 1: +2 Total assist;From elevated surface;With armrests Stand Pivot Transfers: Patient Percentage: 60% Details for Transfer Assistance: Patient attempted sit to stand  to RW but leaning too far posteriorly.  Patient assisted back to sitting and just performed stand pivot with 2 person assist with patient holding onto PTs' UE's and using gait belt.  Needed assist to move LEs for pivot transfer as patient did not move them himself.  DOE 3/4.   Ambulation/Gait Ambulation/Gait Assistance: Not tested (comment) Stairs: No Wheelchair Mobility Wheelchair Mobility: No    PT Goals Acute Rehab PT Goals PT Goal: Supine/Side to Sit - Progress: Progressing toward goal PT Goal: Sit at Edge Of Bed - Progress: Progressing toward goal PT Goal: Sit to Stand - Progress: Progressing toward goal PT Goal: Stand to Sit - Progress: Progressing toward goal PT Transfer Goal: Bed to Chair/Chair to Bed - Progress: Progressing toward goal PT Goal: Stand - Progress: Progressing toward goal  Visit Information  Last PT Received On: 02/14/12 Assistance Needed: +2    Subjective Data  Subjective: Garbled speech overall secondary to DOE at 3/4.   Cognition  Overall Cognitive Status: Impaired Area of Impairment: Attention;Safety/judgement;Awareness of deficits Arousal/Alertness: Awake/alert Orientation Level: Appears intact for tasks assessed Behavior During Session: [facility] for tasks performed Current Attention Level: Sustained Safety/Judgement: Decreased awareness of need for assistance Safety/Judgement - Other Comments: Patient with poor safety and judgement overall, slightly impulsive and unaware of safety    Balance  Static Sitting Balance Static Sitting - Balance Support: Bilateral upper extremity supported;Feet supported Static Sitting - Level of Assistance: 4: Min assist Static Sitting - Comment/# of Minutes: 2 minutes, loses balance posteriorly if not cued and steadied constantly.   Static Standing Balance Static Standing - Balance Support: Bilateral upper extremity supported;During functional activity Static Standing - Level of Assistance: 1: +2 Total assist (pt=  60%)  Static Standing - Comment/# of Minutes: leans back significantly in standing.  End of Session PT - End of Session Equipment Utilized During Treatment: Gait belt Activity Tolerance: Patient limited by fatigue Patient left: in chair;with call bell/phone within reach Nurse Communication: Mobility status       INGOLD,Ivianna Notch 02/14/2012, 12:58 PM  [facility] Acute Rehabilitation [phone] (pager)
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
## 2012-02-14 NOTE — Significant Event (Signed)
ICU Course summary  56 year old male with PMH of HTN, COPD, Hep C, polysubstance abuse, chronic recurrent pancreatitis, PUD and ESRD. He initially called EMS on 6/24 from home complaining of chest pain. At arrival EMS found him in PEA arrest. He was intubated in the field, advanced CPR was initiated and return to spontaneous circulation was obtained after 5 to 6 minutes. Initial EKG showed peaked T waves and K+ of 6.4. At arrival was unresponsive to painful stimuli. Patient reportedly had missed dialysis for the week PTA. Admitted to the Critical care service. Had Acute Respiratory failure secondary to cardiac arrest, due to hyperkalemia, metabolic derangements and sepsis. Was admitted to the ICU,  Neprohology assisted in his care.Treatment included emergent HD, empiric antibiotics and end organ support w/ volume resuscitation and pressors. He did have mild troponin bump after CPR and complicated by sepsis. His hemodynamics improved. Culture data demonstrated Pseudomonas PNA (felt to be an aspiration), Pseudomonas in blood and urine, as well as strep agalactiae in blood and urine His antibiotic course was narrowed as noted in ABX flow below.  He was initially extubated on 7/3. He required re-intubation on 7/6 due to antibiotic failure and new atelectasis. At this point he was re-intubated and then placed on meropenem and Cefepime was stopped. He self extubated 7/10. Course also c/b Cdiff colits. At time of transfer of service his CXR continues to show left sided atx. Respiratory status remains marginal due to residual atelectasis, deconditioning and underlying COPD.    [name] ACNP-BC [facility] Pulmonary/Critical Care Pager # [phone] OR # [phone] if no answer

## 2012-02-14 NOTE — Procedures (Addendum)
Intubation Procedure Note [name] [id number] 1956-02-20  Procedure: Intubation Indications: Respiratory insufficiency  Procedure Details Consent: Unable to obtain consent because of altered level of consciousness. Time Out: Verified patient identification, verified procedure, site/side was marked, verified correct patient position, special equipment/implants available, medications/allergies/relevent history reviewed, required imaging and test results available.  Performed  Maximum sterile technique was used including antiseptics, cap, gloves, hand hygiene and mask.  MAC    Evaluation Hemodynamic Status: BP stable throughout; O2 sats: stable throughout Patient's Current Condition: stable Complications: No apparent complications Patient did tolerate procedure well. Chest X-ray ordered to verify placement.  CXR: pending.   YACOUB,WESAM 02/14/2012   During the procedure pt rec'd 30mg  etomidate, 50mg  rocuronium, 4mg  versed, and Fentanyl.  Eliane Decree, RN,

## 2012-02-14 NOTE — Progress Notes (Signed)
Attempted cpt.  Pt unable to tolerate,asking me to "stop".  RN notified.

## 2012-02-14 NOTE — Progress Notes (Signed)
Pt transferred to 2912. Rapid Sequence Intubation at bedside on 2900.

## 2012-02-14 NOTE — Progress Notes (Signed)
ANTIBIOTIC CONSULT NOTE - follow-up  Pharmacy Consult:  Meropenem Indication:  Sepsis, pseudomonas PNA/bacteremia, group B strep bacteremia, now HCAP  Allergies  Allergen Reactions  . Penicillins     "childhood reaction"    Patient Measurements: Height: 6' (182.9 cm) Weight: 145 lb 1 oz (65.8 kg) IBW/kg (Calculated) : 77.6    Vital Signs: Temp: 98.3 F (36.8 C) (07/12 0719) Temp src: Oral (07/12 0719) BP: 177/95 mmHg (07/12 0719) Pulse Rate: 103  (07/12 0719) Intake/Output from previous day: 07/11 0701 - 07/12 0700 In: 925 [I.V.:875; IV Piggyback:50] Out: 2700 [Urine:200] Intake/Output from this shift: Total I/O In: 50 [I.V.:50] Out: -   Labs:  Basename 02/13/12 1205 02/13/12 0500  WBC 4.8 --  HGB 8.4* --  PLT 395 --  LABCREA -- --  CREATININE -- 5.28*   Estimated Creatinine Clearance: 14.4 ml/min (by C-G formula based on Cr of 5.28). No results found for this basename: VANCOTROUGH:2,VANCOPEAK:2,VANCORANDOM:2,GENTTROUGH:2,GENTPEAK:2,GENTRANDOM:2,TOBRATROUGH:2,TOBRAPEAK:2,TOBRARND:2,AMIKACINPEAK:2,AMIKACINTROU:2,AMIKACIN:2, in the last 72 hours   Microbiology: Recent Results (from the past 720 hour(s))  CULTURE, BLOOD (ROUTINE X 2)     Status: Normal   Collection Time   01/26/12  8:50 PM      Component Value Range Status Comment   Specimen Description BLOOD   Final    Special Requests     Final    Value: LEFT IJ BOTTLES DRAWN AEROBIC AND ANAEROBIC 10CC EACH   Culture  Setup Time [id number]   Final    Culture     Final    Value: GROUP B STREP(S.AGALACTIAE)ISOLATED     STAPHYLOCOCCUS SPECIES (COAGULASE NEGATIVE)     Note: THE SIGNIFICANCE OF ISOLATING THIS ORGANISM FROM A SINGLE SET OF BLOOD CULTURES WHEN MULTIPLE SETS ARE DRAWN IS UNCERTAIN. PLEASE NOTIFY THE MICROBIOLOGY DEPARTMENT WITHIN ONE WEEK IF SPECIATION AND SENSITIVITIES ARE REQUIRED.     Note: Gram Stain Report Called to,Read Back By and Verified With: LESLIE WILSON @ 1444 01/27/12 BY KRAWS   Report  Status 01/29/2012 FINAL   Final    Organism ID, Bacteria GROUP B STREP(S.AGALACTIAE)ISOLATED   Final   URINE CULTURE     Status: Normal   Collection Time   01/26/12  9:39 PM      Component Value Range Status Comment   Specimen Description URINE, CATHETERIZED   Final    Special Requests Normal   Final    Culture  Setup Time [id number]   Final    Colony Count >=100,000 COLONIES/ML   Final    Culture     Final    Value: GROUP B STREP(S.AGALACTIAE)ISOLATED     Note: TESTING AGAINST S. AGALACTIAE NOT ROUTINELY PERFORMED DUE TO PREDICTABILITY OF AMP/PEN/VAN SUSCEPTIBILITY.   Report Status 01/28/2012 FINAL   Final   CULTURE, BLOOD (ROUTINE X 2)     Status: Normal   Collection Time   01/26/12 10:06 PM      Component Value Range Status Comment   Specimen Description BLOOD RIGHT RADIAL A-LINE DRAW   Final    Special Requests BOTTLES DRAWN AEROBIC AND ANAEROBIC [facility]   Final    Culture  Setup Time [id number]   Final    Culture     Final    Value: PSEUDOMONAS AERUGINOSA     Note: Gram Stain Report Called to,Read Back By and Verified With: ROSE COLLOM @ 2150 ON 01/27/12 BY GOLLD   Report Status 01/30/2012 FINAL   Final    Organism ID, Bacteria PSEUDOMONAS AERUGINOSA   Final   MRSA  PCR SCREENING     Status: Normal   Collection Time   01/26/12 10:48 PM      Component Value Range Status Comment   MRSA by PCR NEGATIVE  NEGATIVE Final   CULTURE, RESPIRATORY     Status: Normal   Collection Time   01/29/12 12:06 AM      Component Value Range Status Comment   Specimen Description TRACHEAL ASPIRATE   Final    Special Requests NONE   Final    Gram Stain     Final    Value: NO WBC SEEN     NO SQUAMOUS EPITHELIAL CELLS SEEN     FEW GRAM POSITIVE COCCI IN PAIRS   Culture FEW PSEUDOMONAS AERUGINOSA   Final    Report Status 01/31/2012 FINAL   Final    Organism ID, Bacteria PSEUDOMONAS AERUGINOSA   Final   CLOSTRIDIUM DIFFICILE BY PCR     Status: Abnormal   Collection Time   01/30/12  4:00 AM       Component Value Range Status Comment   C difficile by pcr POSITIVE (*) NEGATIVE Final   WOUND CULTURE     Status: Normal   Collection Time   01/30/12 12:55 PM      Component Value Range Status Comment   Specimen Description WOUND RIGHT LEG   Final    Special Requests NONE   Final    Gram Stain     Final    Value: NO WBC SEEN     NO SQUAMOUS EPITHELIAL CELLS SEEN     NO ORGANISMS SEEN   Culture NO GROWTH 2 DAYS   Final    Report Status 02/01/2012 FINAL   Final   CULTURE, BLOOD (SINGLE)     Status: Normal   Collection Time   01/30/12  9:35 PM      Component Value Range Status Comment   Specimen Description BLOOD CENTRAL LINE   Final    Special Requests BOTTLES DRAWN AEROBIC AND ANAEROBIC 10CC   Final    Culture  Setup Time 01/31/2012 03:48   Final    Culture NO GROWTH 5 DAYS   Final    Report Status 02/06/2012 FINAL   Final   CULTURE, RESPIRATORY     Status: Normal   Collection Time   02/08/12  6:18 PM      Component Value Range Status Comment   Specimen Description TRACHEAL ASPIRATE   Final    Special Requests NONE   Final    Gram Stain     Final    Value: RARE WBC PRESENT, PREDOMINANTLY PMN     RARE SQUAMOUS EPITHELIAL CELLS PRESENT     NO ORGANISMS SEEN   Culture RARE CANDIDA ALBICANS   Final    Report Status 02/10/2012 FINAL   Final   CULTURE, RESPIRATORY     Status: Normal   Collection Time   02/09/12  9:29 AM      Component Value Range Status Comment   Specimen Description TRACHEAL ASPIRATE   Final    Special Requests NONE   Final    Gram Stain     Final    Value: MODERATE WBC PRESENT, PREDOMINANTLY PMN     FEW SQUAMOUS EPITHELIAL CELLS PRESENT     NO ORGANISMS SEEN   Culture     Final    Value: FEW PSEUDOMONAS AERUGINOSA     RARE CANDIDA ALBICANS   Report Status 02/12/2012 FINAL   Final  Organism ID, Bacteria PSEUDOMONAS AERUGINOSA   Final     Medical History: Past Medical History  Diagnosis Date  . Pancytopenia     chronic  . Polysubstance abuse      chronic most notable for alcohol  . Malignant hypertension   . Hepatitis C   . COPD (chronic obstructive pulmonary disease)   . Chronic recurrent pancreatitis     likely secondary to alcoholism  . Smoker   . Alcohol abuse   . Respiratory failure Jan 2012    Hx of VDRF   . Burn   . PUD (peptic ulcer disease)     two small ulcers on 2011 EGD, duodenitis noted on 2012 EGD w/o presence of ulcers  . Hep C w/o coma, chronic   . End-stage renal disease on hemodialysis     HD on MWF, [facility]    Assessment: 21 YOM was brought in by EMS with PEA arrest.  Patient with sepsis d/t Pseudomonal PNA/bacteremia and Group B strep bacteremia, and HCAP previously on vancomycin and Cefepime.  Antibiotic adjusted to Merrem monotherapy today, day#15 of antibiotics/Day#4 meropenem.  He also has ESRD with his HD schedule now on TTS.   Patient continues on vancomycin PO for C.diff colitis.  Vanc 7/7 >> 7/9 Cefepime 6/28 >> 7/9, clinical failure Flagyl 6/27 >> 7/3 Vanc PO 6/27>> plan thru 7/11 Merrem 7/9 >> Fluconazole 7/12 >> (for yeast-like rash on buttocks)  6/23 urine >> group B strep 6/23 blood cx from A-line >> Pseudomonas 6/23 blooc cx from L IJ >> group B strep, CNS 6/26 TA >>pseudomonas 6/27 C diff PCR>> positive 6/27 BCX >>Negative  6/27 right leg wound>> Negative. 7/6 sputum >> rare candida albicans  7/7 TA>> Few pseudomonas (pan-susc), few C. albicans  Plan:  - Merrem 500mg  IV Q24H, give post HD on HD days - ? If 6/7 trach aspirate culture with pseudomonas is colonization.  - Monitor clinical course and length of meropenem therapy  Juliette Alcide, PharmD, BCPS.  Pager: [phone] 02/14/2012, 9:07 AM

## 2012-02-14 NOTE — Procedures (Signed)
Bronchoscopy Procedure Note AASHIR UMHOLTZ [id number] 12/03/1955  Procedure: Bronchoscopy Indications: Diagnostic evaluation of the airways, Obtain specimens for culture and/or other diagnostic studies and Remove secretions  Procedure Details Consent: Unable to obtain consent because of altered level of consciousness. Time Out: Verified patient identification, verified procedure, site/side was marked, verified correct patient position, special equipment/implants available, medications/allergies/relevent history reviewed, required imaging and test results available.  Performed  In preparation for procedure, patient was given 100% FiO2 and bronchoscope lubricated. Sedation: Benzodiazepines, Muscle relaxants and Etomidate  Airway entered and the following bronchi were examined: RUL, RML, RLL, LUL, LLL and Bronchi.   Procedures performed: Brushings performed Bronchoscope removed.    Evaluation Hemodynamic Status: BP stable throughout; O2 sats: stable throughout Patient's Current Condition: stable Specimens:  Sent purulent fluid Complications: No apparent complications Patient did tolerate procedure well.   Koren Bound 02/14/2012

## 2012-02-14 NOTE — Progress Notes (Signed)
Name: [name] MRN: [id number] DOB: 1956-03-25    LOS: 19 Date of admit 01/26/2012  5:09 PM    PULMONARY / CRITICAL CARE MEDICINE  BRIEF    56 year old male with PMH of HTN, COPD, Hep C, polysubstance abuse, chronic recurrent pancreatitis, PUD and ESRD. Had PEA arrest on 6/24. Intubated, got emergent HD for hyperkalemia, also treated for shock (sepsis and acidosis). Found to have pseudomonas PNA & bacteremia, also strep agalactiae in blood and urine. Treated w/ antibiotics.Developed C diff on 6/26.  Respiratory status marginal. Required re-intubation on 7/6, self extubated 7/10.  Transferred to SDU 7/11 still had left sided atx. 7/12, back to ICU for worsening collapse on left and acute respiratory failure.   KEY SOCIAL HX Patient's sister, [name] [phone]). Patient has brothers as well. Ms. Battershell  Most involved and admitted to patient problems with non compliance and substance abuse   EVENTS 6/25 - increasing pressors, fluid bolus for low bp, hypoglycemic requiring D10 6/26 - agitation, some bleeding from upper airway, diarrhea overnight--> flexiseal 6/28 - Severe agitation requiring increased fent gtt overnight, Transfused 1 u on HD            CT abd  - fracture rt 6-8 ribs, splenomegaly, LLL consolidation 6/28 - precedex/ klonopin added with decreased fent gtt 7/01 - Not weanable due to agitation on WUA 7/2 - improved, extubated 7/3 - Tx to IM TS 7/6 - Hypertensive, tachycardic, respiratory distress, intubated 7/7- bronch for collapse 7/8- did not recollapse 7/9- self extubated, marginal 7/10- improved resp status, NIMV 7/12: acute on chronic resp failure due to recurrent plugging and collapse. Moved back to the ICU   SUBJECTIVE/OVERNIGHT/INTERVAL HX More confused. Respiratory effort increased.   Vital Signs: Temp:  [97.6 F (36.4 C)-100.7 F (38.2 C)] 98.5 F (36.9 C) (07/12 1100) Pulse Rate:  [88-120] 111  (07/12 1246) Resp:  [20-35] 33  (07/12  1246) BP: (153-200)/(84-112) 192/98 mmHg (07/12 1246) SpO2:  [83 %-100 %] 94 % (07/12 1413) Weight:  [63.9 kg (140 lb 14 oz)-65.8 kg (145 lb 1 oz)] 65.8 kg (145 lb 1 oz) (07/12 0000)   Physical Examination: Gen: awake, confused.  HEENT: NCAT PULM: more rhonchus and now w/ increased accessory muscle use.  CV; RRR, systolic murmur Ab: BS+, soft, nontender Ext: warm, no edema Neuro: awake, speaking  ASSESSMENT AND PLAN:    Patient Active Problem List  Diagnosis  . SUBSTANCE ABUSE, MULTIPLE  . PEPTIC ULCER DISEASE  . Chronic recurrent pancreatitis  . End stage renal disease on dialysis  . Alcohol abuse  . COPD (chronic obstructive pulmonary disease)  . Neuropathy  . Protein malnutrition  . PEA (Pulseless electrical activity)  . Acute respiratory failure with hypoxia  . Hyperkalemia  . Bacteremia due to Pseudomonas  . Sepsis due to Streptococcus, group B  . C. difficile colitis  . Anemia  . Thrombocytopenia   See significant events note on 7/12 for ICU course.   PULMONARY  Lab 02/12/12 0615 02/10/12 0440 02/09/12 1015 02/08/12 1806 02/08/12 1537  PHART 7.427 7.389 7.449 7.487* 7.480*  PCO2ART 43.6 41.0 40.6 39.0 39.5  PO2ART 68.2* 82.4 61.0* 74.0* 53.0*  HCO3 28.3* 24.2* 28.3* 29.6* 29.4*  O2SAT 93.8 98.2 92.0 96.0 89.0    CXR: 7/10- slight increasing atx/infiltrate rt greater left ETT:  7/1>>>7/3            7/6>>>7/10 self extub  A:  Acute On Chronic Respiratory due to LLL Aspiration pna d/t  pseudomonas, complicated by underlying COPD & Fracture rt 6-8 ribs with cpr. Aeration Worse on CXR. Looks worse this afternoon.   P:   -intubate/bronch, re-culture, likely will need Trach at this point.  -narcotics to control pain, IS -f/u CXR and ABG  CARDIOVASCULAR  Lab 02/11/12 1607 02/11/12 1010 02/11/12 0407 02/10/12 2207 02/10/12 1555  TROPONINI <0.30 <0.30 <0.30 <0.30 <0.30  LATICACIDVEN -- -- -- -- --  PROBNP -- -- -- -- --   ECG:  Peak T waves, Sinus rhythm  at admit on 6/23 -> normalized 6/24 Lines: Left IJ CVL 6/24 >> 7/3              A: History of hypertension -BP significantly elevated  P:  -tele  -cont home meds  -HD per renal   RENAL  Lab 02/13/12 0500 02/11/12 0407 02/10/12 0430 02/08/12 1914 02/08/12 0558  NA 128* 133* 136 137 139  K 3.5 4.2 -- -- --  CL 92* 97 101 99 99  CO2 23 22 24 27 25   BUN 26* 40* 28* 15 32*  CREATININE 5.28* 6.55* 5.24* 2.99* 5.18*  CALCIUM 8.8 9.1 9.1 9.2 9.6  MG 1.8 -- -- -- --  PHOS 7.4* -- -- 3.8 --   Intake/Output      07/11 0701 - 07/12 0700 07/12 0701 - 07/13 0700   P.O.     I.V. (mL/kg) 875 (13.3) 350 (5.3)   IV Piggyback 50    Total Intake(mL/kg) 925 (14.1) 350 (5.3)   Urine (mL/kg/hr) 200 (0.1)    Other 2500    Stool  45   Total Output 2700 45   Net -1775 +305         Foley:  6/23 >> out  A:   ESRD on hemodialysis   P:   -HD per Nephrology , for today hd Per renal  GASTROINTESTINAL  Lab 02/11/12 0407 02/08/12 1914  AST 33 --  ALT 16 --  ALKPHOS 143* --  BILITOT 0.4 --  PROT 8.0 --  ALBUMIN 1.9* 2.3*    A:  History of PUD, CDIFF Colitis with pos C Diff PCR  P:   -oral vanco -->see ID  HEMATOLOGIC  Lab 02/13/12 1205 02/11/12 0407 02/10/12 0430 02/08/12 0558  HGB 8.4* 8.3* 7.5* 8.5*  HCT 25.0* 25.4* 23.8* 25.8*  PLT 395 282 235 198  INR -- -- -- --  APTT -- -- -- --   A:  Anemic of critical illness / Thrombocytopenia  Stable, PLT improving P:  sub q hep  INFECTIOUS  Lab 02/13/12 1205 02/11/12 0407 02/10/12 0430 02/08/12 1913 02/08/12 0558  WBC 4.8 6.1 5.9 -- 4.6  PROCALCITON -- -- -- 3.11 --   Cultures:  6/23 urine >> strep agalactiae 6/23 resp >>strep agalactiae 6/23 bld >> pseudomonas, strep agalactiae 6/26 C diff pCR POS 6/27 blood X 2  >> neg  7/6 Sputum >>>rare candida 7/7 BAL bronch>>>few speudomonas, pansens 7/12 BAL>>>  ABx   Pip/ Tazo 6/23 >> 6/27 cipro 6/23 >>6/27 vanc 6/23 >> 6/27 Cefepime 6/27 (pseudomonas, Strep  agalactiae)>>7/9 clinical failure? Meropenem 7/9>>> PO vanc 6/27 (CDIFF)>> Flagyl 6/27 (CDIFF)>>7/3 Vanc (HCAP) >>off  A:   Pseudomonas sepsis, d/t LLL PNA d/t pseudomonas History of Hep C Group B Strep Agalactiae bacteremia C. Diff colitis Mucus plug, likely HCAP on 7/6 -Clinical failure to cefipime, with collapse, Concern to limit duration after 13 days already of abx with cdiff--> however CXR still remarkably abnormal.  P:  -cont current  oral vanc for C diff  -for now cont merrem, had initially hoped to d/c after 7d, now d 3, will follow clinical status to determine course.    ENDOCRINE  Lab 02/14/12 1132 02/14/12 0636 02/14/12 0029 02/13/12 1829 02/13/12 1137  GLUCAP 115* 105* 108* 91 94   A:   Hypoglycemia - resolved  P:   -check CBG's Q6 -add D5 maintenence while NPO  NEUROLOGIC  A:  Encephalopathy - agitated delirium, h/o polysubstance abuse. His confusion is worse, most likely due to hypoxia.   P:   -Klonopin  -fent q1h for chest pain fx, splinting Pt Continuous sedation drips now.  BEST PRACTICE / DISPOSITION - Level of Care:  ICU - Primary Service:  IMTS - Consultants:  Nephrology, ID - Code Status:  Full code - Diet:  TF 7/8 - DVT Px:  SCDs - GI Px: H2 - Skin Integrity:  - Social / Family: no family 7/6 at bedside  CC time of 55 min.  Patient seen and examined, agree with above note.  I dictated the care and orders written for this patient under my direction.  Koren Bound, M.D. [phone]

## 2012-02-15 ENCOUNTER — Inpatient Hospital Stay (HOSPITAL_COMMUNITY): Payer: Medicare Other

## 2012-02-15 LAB — POCT I-STAT 3, ART BLOOD GAS (G3+)
Acid-Base Excess: 3 mmol/L — ABNORMAL HIGH (ref 0.0–2.0)
Patient temperature: 99.6
pH, Arterial: 7.43 (ref 7.350–7.450)

## 2012-02-15 LAB — RENAL FUNCTION PANEL
Albumin: 1.8 g/dL — ABNORMAL LOW (ref 3.5–5.2)
BUN: 30 mg/dL — ABNORMAL HIGH (ref 6–23)
Calcium: 9.2 mg/dL (ref 8.4–10.5)
Creatinine, Ser: 5.76 mg/dL — ABNORMAL HIGH (ref 0.50–1.35)
Glucose, Bld: 122 mg/dL — ABNORMAL HIGH (ref 70–99)
Phosphorus: 5.9 mg/dL — ABNORMAL HIGH (ref 2.3–4.6)

## 2012-02-15 LAB — CBC
HCT: 24.7 % — ABNORMAL LOW (ref 39.0–52.0)
HCT: 24.6 % — ABNORMAL LOW (ref 39.0–52.0)
MCH: 30.7 pg (ref 26.0–34.0)
MCH: 31 pg (ref 26.0–34.0)
MCHC: 31.6 g/dL (ref 30.0–36.0)
MCV: 97.2 fL (ref 78.0–100.0)
MCV: 97.6 fL (ref 78.0–100.0)
Platelets: 454 10*3/uL — ABNORMAL HIGH (ref 150–400)
RDW: 15.2 % (ref 11.5–15.5)
RDW: 15.4 % (ref 11.5–15.5)
WBC: 5.8 10*3/uL (ref 4.0–10.5)

## 2012-02-15 LAB — GLUCOSE, CAPILLARY
Glucose-Capillary: 102 mg/dL — ABNORMAL HIGH (ref 70–99)
Glucose-Capillary: 105 mg/dL — ABNORMAL HIGH (ref 70–99)
Glucose-Capillary: 92 mg/dL (ref 70–99)
Glucose-Capillary: 94 mg/dL (ref 70–99)

## 2012-02-15 LAB — PHOSPHORUS: Phosphorus: 5.5 mg/dL — ABNORMAL HIGH (ref 2.3–4.6)

## 2012-02-15 LAB — MAGNESIUM: Magnesium: 2 mg/dL (ref 1.5–2.5)

## 2012-02-15 LAB — PROCALCITONIN: Procalcitonin: 1.39 ng/mL

## 2012-02-15 LAB — COMPREHENSIVE METABOLIC PANEL
Albumin: 1.8 g/dL — ABNORMAL LOW (ref 3.5–5.2)
BUN: 23 mg/dL (ref 6–23)
Chloride: 102 mEq/L (ref 96–112)
Creatinine, Ser: 5.17 mg/dL — ABNORMAL HIGH (ref 0.50–1.35)
Total Bilirubin: 0.3 mg/dL (ref 0.3–1.2)
Total Protein: 7.8 g/dL (ref 6.0–8.3)

## 2012-02-15 MED ORDER — DARBEPOETIN ALFA-POLYSORBATE 100 MCG/0.5ML IJ SOLN
100.0000 ug | Freq: Once | INTRAMUSCULAR | Status: AC
  Administered 2012-02-15: 100 ug via INTRAVENOUS

## 2012-02-15 MED ORDER — DARBEPOETIN ALFA-POLYSORBATE 100 MCG/0.5ML IJ SOLN
INTRAMUSCULAR | Status: AC
  Administered 2012-02-15: 100 ug via INTRAVENOUS
  Filled 2012-02-15: qty 0.5

## 2012-02-15 MED ORDER — DARBEPOETIN ALFA-POLYSORBATE 200 MCG/0.4ML IJ SOLN: 200.0000 ug | INTRAMUSCULAR | Status: DC

## 2012-02-15 MED ORDER — VANCOMYCIN 50 MG/ML ORAL SOLUTION
500.0000 mg | Freq: Four times a day (QID) | ORAL | Status: DC
  Administered 2012-02-15 – 2012-02-17 (×7): 500 mg
  Filled 2012-02-15 (×12): qty 10

## 2012-02-15 NOTE — Progress Notes (Signed)
Increased sedation while on HD---HD RN at bedside--pt shivering and wide awke--appears more comfortable after the increase.

## 2012-02-15 NOTE — Progress Notes (Signed)
Name: [name] MRN: [id number] DOB: [birth year]    LOS: 20 Date of admit 01/26/2012  5:09 PM    PULMONARY / CRITICAL CARE MEDICINE  BRIEF    56 year old male with PMH of HTN, COPD, Hep C, polysubstance abuse, chronic recurrent pancreatitis, PUD and ESRD. Had PEA arrest on 6/24. Intubated, got emergent HD for hyperkalemia, also treated for shock (sepsis and acidosis). Found to have pseudomonas PNA & bacteremia, also strep agalactiae in blood and urine. Treated w/ antibiotics.Developed C diff on 6/26.  Respiratory status marginal. Required re-intubation on 7/6, self extubated 7/10.  Transferred to SDU 7/11 still had left sided atx. 7/12, back to ICU for worsening collapse on left and acute respiratory failure.   KEY SOCIAL HX Patient's sister, [name] [phone]). Patient has brothers as well. [name]  Most involved and admitted to patient problems with non compliance and substance abuse   EVENTS 6/25 - increasing pressors, fluid bolus for low bp, hypoglycemic requiring D10 6/26 - agitation, some bleeding from upper airway, diarrhea overnight--> flexiseal 6/28 - Severe agitation requiring increased fent gtt overnight, Transfused 1 u on HD            CT abd  - fracture rt 6-8 ribs, splenomegaly, LLL consolidation 6/28 - precedex/ klonopin added with decreased fent gtt 7/01 - Not weanable due to agitation on WUA 7/2 - improved, extubated 7/3 - Tx to IM TS 7/6 - Hypertensive, tachycardic, respiratory distress, intubated 7/7- bronch for collapse 7/8- did not recollapse 7/9- self extubated, marginal 7/10- improved resp status, NIMV 7/12: acute on chronic resp failure due to recurrent plugging and collapse. Moved back to the ICU   SUBJECTIVE/OVERNIGHT/INTERVAL HX Intubated, sedated  Vital Signs: Temp:  [98.1 F (36.7 C)-99.6 F (37.6 C)] 99 F (37.2 C) (07/13 0800) Pulse Rate:  [77-111] 80  (07/13 0900) Resp:  [17-35] 18  (07/13 0900) BP: (85-195)/(44-109) 86/46  mmHg (07/13 0900) SpO2:  [88 %-99 %] 96 % (07/13 0900) FiO2 (%):  [40 %-100 %] 40 % (07/13 0900) Weight:  [65.6 kg (144 lb 10 oz)] 65.6 kg (144 lb 10 oz) (07/13 0500)   Physical Examination: Gen: intubated, sedated HEENT: NCAT PULM: more rhonchus and now w/ increased accessory muscle use.  CV; RRR, systolic murmur Ab: BS+, soft, nontender Ext: warm, no edema Neuro: follows commands, moves all ext  ASSESSMENT AND PLAN:    Patient Active Problem List  Diagnosis  . SUBSTANCE ABUSE, MULTIPLE  . PEPTIC ULCER DISEASE  . Chronic recurrent pancreatitis  . End stage renal disease on dialysis  . Alcohol abuse  . COPD (chronic obstructive pulmonary disease)  . Neuropathy  . Protein malnutrition  . PEA (Pulseless electrical activity)  . Acute respiratory failure with hypoxia  . Hyperkalemia  . Bacteremia due to Pseudomonas  . Sepsis due to Streptococcus, group B  . C. difficile colitis  . Anemia  . Thrombocytopenia   See significant events note on 7/12 for ICU course.   PULMONARY  Lab 02/14/12 1656 02/14/12 1428 02/12/12 0615 02/10/12 0440 02/09/12 1015  PHART 7.446 7.477* 7.427 7.389 7.449  PCO2ART 41.3 36.9 43.6 41.0 40.6  PO2ART 136.0* 61.3* 68.2* 82.4 61.0*  HCO3 28.5* 27.0* 28.3* 24.2* 28.3*  O2SAT 99.0 93.5 93.8 98.2 92.0    CXR: 7/13 >> improvement in LLL collapse compared with priors ETT:  7/1>>>7/3            7/6>>>7/10 self extub  A:  Acute On Chronic Respiratory due  to LLL Aspiration pna d/t pseudomonas, complicated by underlying COPD & Fracture rt 6-8 ribs with cpr.   P:   -MV, work towards PSV but suspect he will need trach due to failure to manage secretions.  -sedation protocol -f/u CXR and ABG  CARDIOVASCULAR  Lab 02/11/12 1607 02/11/12 1010 02/11/12 0407 02/10/12 2207 02/10/12 1555  TROPONINI <0.30 <0.30 <0.30 <0.30 <0.30  LATICACIDVEN -- -- -- -- --  PROBNP -- -- -- -- --   ECG:  Peak T waves, Sinus rhythm at admit on 6/23 -> normalized  6/24 Lines: Left IJ CVL 6/24 >> 7/3              A: History of hypertension -BP significantly elevated  P:  -tele  -cont home meds  -HD per renal   RENAL  Lab 02/15/12 0430 02/13/12 0500 02/11/12 0407 02/10/12 0430 02/08/12 1914  NA 138 128* 133* 136 137  K 4.0 3.5 -- -- --  CL 102 92* 97 101 99  CO2 26 23 22 24 27   BUN 23 26* 40* 28* 15  CREATININE 5.17* 5.28* 6.55* 5.24* 2.99*  CALCIUM 9.2 8.8 9.1 9.1 9.2  MG 2.0 1.8 -- -- --  PHOS 5.5* 7.4* -- -- 3.8   Intake/Output      07/12 0701 - 07/13 0700 07/13 0701 - 07/14 0700   I.V. (mL/kg) 1133.8 (17.3) 48 (0.7)   NG/GT 150 130   IV Piggyback 50 40   Total Intake(mL/kg) 1333.8 (20.3) 218 (3.3)   Urine (mL/kg/hr)     Other     Stool 45    Total Output 45    Net +1288.8 +218         Foley:  6/23 >> out  A:   ESRD on hemodialysis   P:   -HD per Nephrology , for today hd  GASTROINTESTINAL  Lab 02/15/12 0430 02/11/12 0407 02/08/12 1914  AST 23 33 --  ALT 12 16 --  ALKPHOS 107 143* --  BILITOT 0.3 0.4 --  PROT 7.8 8.0 --  ALBUMIN 1.8* 1.9* 2.3*    A:  History of PUD, CDIFF Colitis with pos C Diff PCR  P:   -oral vanco -->see ID  HEMATOLOGIC  Lab 02/15/12 0430 02/13/12 1205 02/11/12 0407 02/10/12 0430  HGB 7.8* 8.4* 8.3* 7.5*  HCT 24.7* 25.0* 25.4* 23.8*  PLT 452* 395 282 235  INR -- -- -- --  APTT -- -- -- --   A:  Anemic of critical illness / Thrombocytopenia  Stable, PLT improving P:  sub q hep  INFECTIOUS  Lab 02/15/12 0430 02/13/12 1205 02/11/12 0407 02/10/12 0430 02/08/12 1913  WBC 6.3 4.8 6.1 5.9 --  PROCALCITON 1.39 -- -- -- 3.11   Cultures:  6/23 urine >> strep agalactiae 6/23 resp >>strep agalactiae 6/23 bld >> pseudomonas, strep agalactiae 6/26 C diff pCR POS 6/27 blood X 2  >> neg  7/6 Sputum >>>rare candida 7/7 BAL bronch>>>few speudomonas, pansens 7/12 BAL>>>  ABx   Pip/ Tazo 6/23 >> 6/27 cipro 6/23 >>6/27 vanc 6/23 >> 6/27 Cefepime 6/27 (pseudomonas, Strep  agalactiae)>>7/9 clinical failure? Meropenem 7/9>>> PO vanc 6/27 (CDIFF)>> Flagyl 6/27 (CDIFF)>>7/3 Vanc (HCAP) >>off  A:   Pseudomonas sepsis, d/t LLL PNA d/t pseudomonas History of Hep C Group B Strep Agalactiae bacteremia C. Diff colitis Mucus plug, likely HCAP on 7/6 -? Clinical failure to cefipime, with collapse, Concern to limit duration after 13 days already of abx with cdiff--> however CXR still  remarkably abnormal.  P:  -cont current oral vanc for C diff  -for now cont merrem, had initially hoped to d/c after 7d, now d 3, will follow clinical status to determine course.    ENDOCRINE  Lab 02/15/12 0755 02/15/12 0400 02/15/12 0006 02/14/12 1934 02/14/12 1657  GLUCAP 105* 102* 92 96 103*   A:   Hypoglycemia - resolved  P:   -check CBG's Q6 -add D5 maintenence while NPO  NEUROLOGIC  A:  Encephalopathy - agitated delirium, h/o polysubstance abuse. Now sedated  P:   -Klonopin TID -fent q1h for chest pain fx, splinting -Continuous sedation drips now.   BEST PRACTICE / DISPOSITION - Level of Care:  ICU - Primary Service:  IMTS - Consultants:  Nephrology, ID - Code Status:  Full code - Diet:  TF 7/8 - DVT Px:  SCDs - GI Px: H2 - Skin Integrity:  - Social / Family: no family 7/6 at bedside  CC time of 30 min.  Levy Pupa, MD, PhD 02/15/2012, 9:46 AM [facility] [phone] or if no answer [phone]

## 2012-02-15 NOTE — Progress Notes (Signed)
Patient ID: [name], male   DOB: 01/28/1956, 56 y.o.   MRN: [id number]   [facility] Progress Note    Subjective:   Re-intubated yesterday for hypoxic respiratory failure- underwent diagnostic bronchoscopy   Objective:   BP 86/46  Pulse 80  Temp 99 F (37.2 C) (Oral)  Resp 18  Ht 6' (1.829 m)  Wt 65.6 kg (144 lb 10 oz)  BMI 19.61 kg/m2  SpO2 96%  Physical Exam: JYN:WGNFAOZHY/QMVHQIO NGE:XBMWU RRR, normal S1 and S2  Resp:Coarse/mechanical BS bilaterally XLK:GMWN, flat, NT, BS normal Ext:No LE edema  Labs: BMET  Lab 02/15/12 0430 02/13/12 0500 02/11/12 0407 02/10/12 0430 02/08/12 1914  NA 138 128* 133* 136 137  K 4.0 3.5 4.2 4.1 4.6  CL 102 92* 97 101 99  CO2 26 23 22 24 27   GLUCOSE 107* 98 96 95 113*  BUN 23 26* 40* 28* 15  CREATININE 5.17* 5.28* 6.55* 5.24* 2.99*  ALB -- -- -- -- --  CALCIUM 9.2 8.8 9.1 9.1 9.2  PHOS 5.5* 7.4* -- -- 3.8   CBC  Lab 02/15/12 0430 02/13/12 1205 02/11/12 0407 02/10/12 0430  WBC 6.3 4.8 6.1 5.9  NEUTROABS -- -- 3.7 3.8  HGB 7.8* 8.4* 8.3* 7.5*  HCT 24.7* 25.0* 25.4* 23.8*  MCV 97.2 93.6 97.7 99.2  PLT 452* 395 282 235    Medications:      . albuterol  2.5 mg Nebulization Q6H  . antiseptic oral rinse  15 mL Mouth Rinse q12n4p  . carvedilol  6.25 mg Per Tube BID WC  . chlorhexidine  15 mL Mouth Rinse BID  . clonazePAM  1 mg Per Tube TID  . cloNIDine  0.3 mg Transdermal Weekly  . darbepoetin (ARANESP) injection - DIALYSIS  100 mcg Intravenous Q Tue-HD  . famotidine  20 mg Per Tube Daily  . feeding supplement (NEPRO CARB STEADY)  1,000 mL Per Tube Q24H  . feeding supplement  30 mL Oral TID WC  . fluconazole  100 mg Oral Daily  . heparin subcutaneous  5,000 Units Subcutaneous Q8H  . lisinopril  20 mg Per Tube Daily  . meropenem (MERREM) IV  500 mg Intravenous Q24H  . mupirocin cream  1 application Topical BID  . DISCONTD: carvedilol  6.25 mg Oral BID WC  . DISCONTD: clonazePAM  1 mg Oral TID  .  DISCONTD: famotidine  20 mg Oral Daily  . DISCONTD: famotidine  20 mg Per Tube Daily  . DISCONTD: lisinopril  20 mg Oral Daily  . DISCONTD: multivitamin  1 tablet Oral QHS     Assessment/ Plan:   1. VDRF- Re-intubated yesterday for progressive respiratory distress and worsening LLL ATX. Underwent bronchoscopy and BAL samples sent for microbiology/cultures. 2. ESRD TTS/[facility]- dialysis today. Has significant weight loss from prolonged hospitalization with decreased EDW. Blood pressure borderline but appears that he may be able to tolerate HD 3. PNA/bacteremia/UTI/Cdiff- on PO Vancomycin for C diff colitis and Meropenem for HCAP (psudomonas from prior respiratory cultures)- afebrile with normal WBC counts 4. Alcohol abuse- chronic and relapsed multiple times after attempts at rehab/abstinence. On scheduled clonazepam for management of withdrawal 5. Anemia:/ACDz and ABLA= hgb 7.8 today, Increase aranesp dose 6. s/p PEA Arrest 7. HTN- on lisinopril and catapress patch with poor control- will DC carvedilol given hypotension       8.   Secondary Hyperparathyroidism=Ca=9.2 low albumin, Phosphorus 5.5. Restart binders when cleared to eat.    Zetta Bills, MD 02/15/2012, 9:29  AM

## 2012-02-15 NOTE — Progress Notes (Signed)
Pt turned to right side and quickly desated to low 80s--placed back in supine and sats resumed in the upper 90s

## 2012-02-16 ENCOUNTER — Inpatient Hospital Stay (HOSPITAL_COMMUNITY): Payer: Medicare Other

## 2012-02-16 LAB — BLOOD GAS, ARTERIAL
Bicarbonate: 30.1 mEq/L — ABNORMAL HIGH (ref 20.0–24.0)
PEEP: 5 cmH2O
Patient temperature: 98.6
TCO2: 31.5 mmol/L (ref 0–100)
pCO2 arterial: 46.5 mmHg — ABNORMAL HIGH (ref 35.0–45.0)
pH, Arterial: 7.427 (ref 7.350–7.450)

## 2012-02-16 LAB — CBC
MCH: 31.2 pg (ref 26.0–34.0)
MCHC: 31.9 g/dL (ref 30.0–36.0)
Platelets: 459 10*3/uL — ABNORMAL HIGH (ref 150–400)
RDW: 15.3 % (ref 11.5–15.5)

## 2012-02-16 LAB — BASIC METABOLIC PANEL
Calcium: 9.1 mg/dL (ref 8.4–10.5)
Chloride: 97 mEq/L (ref 96–112)
Creatinine, Ser: 2.92 mg/dL — ABNORMAL HIGH (ref 0.50–1.35)
GFR calc Af Amer: 26 mL/min — ABNORMAL LOW (ref >90)
Sodium: 134 mEq/L — ABNORMAL LOW (ref 135–145)

## 2012-02-16 LAB — GLUCOSE, CAPILLARY
Glucose-Capillary: 94 mg/dL (ref 70–99)
Glucose-Capillary: 116 mg/dL — ABNORMAL HIGH (ref 70–99)

## 2012-02-16 LAB — PHOSPHORUS: Phosphorus: 4.1 mg/dL (ref 2.3–4.6)

## 2012-02-16 LAB — MAGNESIUM: Magnesium: 1.9 mg/dL (ref 1.5–2.5)

## 2012-02-16 MED ORDER — POTASSIUM CHLORIDE 10 MEQ/50ML IV SOLN
10.0000 meq | INTRAVENOUS | Status: AC
  Administered 2012-02-16 (×2): 10 meq via INTRAVENOUS
  Filled 2012-02-16 (×2): qty 50

## 2012-02-16 MED ORDER — AMIODARONE LOAD VIA INFUSION
150.0000 mg | Freq: Once | INTRAVENOUS | Status: AC
  Administered 2012-02-16: 150 mg via INTRAVENOUS
  Filled 2012-02-16: qty 83.34

## 2012-02-16 MED ORDER — BIOTENE DRY MOUTH MT LIQD
15.0000 mL | Freq: Four times a day (QID) | OROMUCOSAL | Status: DC
  Administered 2012-02-17 – 2012-02-21 (×18): 15 mL via OROMUCOSAL

## 2012-02-16 MED ORDER — SODIUM CHLORIDE 0.9 % IV BOLUS (SEPSIS)
500.0000 mL | Freq: Once | INTRAVENOUS | Status: AC
  Administered 2012-02-16: 500 mL via INTRAVENOUS

## 2012-02-16 MED ORDER — AMIODARONE HCL IN DEXTROSE 360-4.14 MG/200ML-% IV SOLN
0.5000 mg/min | INTRAVENOUS | Status: DC
  Administered 2012-02-16 (×2): 0.5 mg/min via INTRAVENOUS
  Filled 2012-02-16 (×6): qty 200

## 2012-02-16 MED ORDER — POTASSIUM CHLORIDE 10 MEQ/100ML IV SOLN: 10.0000 meq | INTRAVENOUS | Status: DC

## 2012-02-16 MED ORDER — AMIODARONE HCL IN DEXTROSE 360-4.14 MG/200ML-% IV SOLN
INTRAVENOUS | Status: AC
  Filled 2012-02-16: qty 200

## 2012-02-16 MED ORDER — SODIUM CHLORIDE 0.9 % IV SOLN
INTRAVENOUS | Status: DC
  Administered 2012-02-16: 05:00:00 via INTRAVENOUS
  Administered 2012-02-18: 10 mL/h via INTRAVENOUS

## 2012-02-16 MED ORDER — CHLORHEXIDINE GLUCONATE 0.12 % MT SOLN
15.0000 mL | Freq: Two times a day (BID) | OROMUCOSAL | Status: DC
  Administered 2012-02-17 – 2012-02-21 (×9): 15 mL via OROMUCOSAL
  Filled 2012-02-16 (×8): qty 15

## 2012-02-16 MED ORDER — AMIODARONE HCL IN DEXTROSE 360-4.14 MG/200ML-% IV SOLN
1.0000 mg/min | INTRAVENOUS | Status: AC
  Administered 2012-02-16 (×2): 1 mg/min via INTRAVENOUS

## 2012-02-16 NOTE — Progress Notes (Signed)
eLink Physician-Brief Progress Note Patient Name: [name] DOB: January 08, 1956 MRN: [id number]  Date of Service  02/16/2012   HPI/Events of Note   SVT, K 3.3, ESRD  eICU Interventions  KCL now   Intervention Category Major Interventions: Electrolyte abnormality - evaluation and management Intermediate Interventions: Arrhythmia - evaluation and management  MCQUAID, [name] 02/16/2012, 3:52 AM

## 2012-02-16 NOTE — Progress Notes (Signed)
eLink Physician-Brief Progress Note Patient Name: [name] DOB: 1956/06/06 MRN: [id number]  Date of Service  02/16/2012   HPI/Events of Note   Tachycardia (Sinus tach with Afib w/ RVR?), hypotension; comfortable on vent  eICU Interventions  12 lead 500cc bolus cvp D/c anti-hypertensives Amiodarone KCl given   Intervention Category Major Interventions: Arrhythmia - evaluation and management;Hypotension - evaluation and management  MCQUAID, [name] 02/16/2012, 4:19 AM

## 2012-02-16 NOTE — Progress Notes (Signed)
Drug-Drug Interaction Report  Class III Antiarrhythmics / Fluconazole  Significance: Major  Warning: Coadministration of fluconazole and amiodarone may increase the risk of potentially fatal cardiac arrhythmias (Torsades de Pointes), especially in seriously ill patients and/or patients receiving high dose fluconazole. Coadministration of fluconazole with dronedarone is contraindicated in official package labeling.  Onset: Delayed Document Level: Suspected  Interacting Medications/Orders:  Class III Antiarrhythmics Oral or Non-Oral, Systemic Fluconazole Oral or Non-Oral, Systemic  1. amiodarone Order ([id number]): amiodarone (NEXTERONE) 1.8 mg/mL load via infusion 150 mg Route: Intravenous Start: 02/16/2012 0430 End: none Frequency: Once 1. fluconazole Order ([id number]): fluconazole (DIFLUCAN) tablet 100 mg Route: Oral Start: 02/14/2012 1000 End: 02/18/2012 0959 Frequency: Daily   Management Code: Professional intervention required  Effects: Coadministration of fluconazole and amiodarone may increase the risk of potentially fatal cardiac arrhythmias (Torsades de Pointes), especially in seriously ill patients and/or patients receiving high dose fluconazole. Coadministration of fluconazole with dronedarone is contraindicated in official package labeling.  Mechanism: fluconazole and amiodarone may cause additive prolongation of the QT interval.  References: 1. Wassmann et al: Dewayne Hatch Intern Med [phone]). 2. Raynald Kemp al: [facility] Emerg Med [phone]). 3. Tholakanahalli VN et al: Dewayne Hatch Pharmacother 96:295(2841). 4. Ferdie Ping et al: [place] Med J 32:440(1027). 5. Henry Russel et al: Pharmacotherapy 25:3664(4034). 6. Genella Mech et al: [facility] Syst Pharm 74:[phone](6387). 7. Flonnie Hailstone et al: [facility] Med Assoc J 5:643(3295). <review> 8. Pham CP et al: Dewayne Hatch Pharmacother 18:8416(6063). 9. Tatetsu H et al: Laural Golden 01:601(0932). 10. Elspeth Cho et al: Neysa Bonito 35:573(2202). 11. Esmond Plants et al: Pediatr Cardiol  [phone]). 12Rayvon Char [facility] et al: [facility] Syst Pharm [phone]). 13. Official package labeling for Multaq (dronedarone). [employer]; [place], [place] (2009).  Recommendation:  Consider duration of therapy of Fluconazole and discontinue as soon as you feel appropriate.  Nadara Mustard, PharmD., MS Clinical Pharmacist Pager:  [phone]  Thank you for allowing pharmacy to be part of this patients care team.

## 2012-02-16 NOTE — Progress Notes (Signed)
eLink Physician-Brief Progress Note Patient Name: [name] DOB: 14-Aug-1955 MRN: [id number]  Date of Service  02/16/2012   HPI/Events of Note   Tachycardia, brief (<1 min) SVT, now back to sinus with PAC  eICU Interventions  BMET, Mg now   Intervention Category Major Interventions: Arrhythmia - evaluation and management  [name] 02/16/2012, 2:09 AM

## 2012-02-16 NOTE — Progress Notes (Signed)
Patient ID: [name], male   DOB: 01/13/1956, 56 y.o.   MRN: [id number]   [facility] Progress Note    Subjective:   Tolerated HD well yesterday with hypotension that responded to lowering dialysate temperature. Hypokalemic this AM with some SVT overnight.   Objective:   BP 88/43  Pulse 94  Temp 100 F (37.8 C) (Oral)  Resp 23  Ht 6' (1.829 m)  Wt 65.1 kg (143 lb 8.3 oz)  BMI 19.46 kg/m2  SpO2 100%  Physical Exam: Gen: Intubated, awake and attempts to mouth words WUJ:WJXBJ RRR, normal S1 and S2  Resp:Coarse BS bilaterally, no rlaes/rhonchi YNW:GNFA, flat, NT, BS normal Ext:No LE edema  Labs: BMET  Lab 02/16/12 0237 02/15/12 1450 02/15/12 0430 02/13/12 0500 02/11/12 0407 02/10/12 0430  NA 134* 134* 138 128* 133* 136  K 3.3* 4.1 4.0 3.5 4.2 4.1  CL 97 99 102 92* 97 101  CO2 29 25 26 23 22 24   GLUCOSE 118* 122* 107* 98 96 95  BUN 12 30* 23 26* 40* 28*  CREATININE 2.92* 5.76* 5.17* 5.28* 6.55* 5.24*  ALB -- -- -- -- -- --  CALCIUM 9.1 9.2 9.2 8.8 9.1 9.1  PHOS 4.1 5.9* 5.5* 7.4* -- --   CBC  Lab 02/16/12 0237 02/15/12 1452 02/15/12 0430 02/13/12 1205 02/11/12 0407 02/10/12 0430  WBC 6.1 5.8 6.3 4.8 -- --  NEUTROABS -- -- -- -- 3.7 3.8  HGB 8.1* 7.8* 7.8* 8.4* -- --  HCT 25.4* 24.6* 24.7* 25.0* -- --  MCV 97.7 97.6 97.2 93.6 -- --  PLT 459* 454* 452* 395 -- --    Medications:      . albuterol  2.5 mg Nebulization Q6H  . amiodarone (NEXTERONE PREMIX) 360 mg/200 mL dextrose      . amiodarone  150 mg Intravenous Once  . antiseptic oral rinse  15 mL Mouth Rinse q12n4p  . carvedilol  6.25 mg Per Tube BID WC  . chlorhexidine  15 mL Mouth Rinse BID  . clonazePAM  1 mg Per Tube TID  . darbepoetin (ARANESP) injection - DIALYSIS  100 mcg Intravenous Once  . darbepoetin (ARANESP) injection - DIALYSIS  200 mcg Intravenous Q Sat-HD  . famotidine  20 mg Per Tube Daily  . feeding supplement (NEPRO CARB STEADY)  1,000 mL Per Tube Q24H  . feeding  supplement  30 mL Oral TID WC  . heparin subcutaneous  5,000 Units Subcutaneous Q8H  . meropenem (MERREM) IV  500 mg Intravenous Q24H  . mupirocin cream  1 application Topical BID  . potassium chloride  10 mEq Intravenous Q1 Hr x 2  . sodium chloride  500 mL Intravenous Once  . vancomycin  500 mg Per Tube Q6H  . DISCONTD: cloNIDine  0.3 mg Transdermal Weekly  . DISCONTD: darbepoetin (ARANESP) injection - DIALYSIS  100 mcg Intravenous Q Tue-HD  . DISCONTD: fluconazole  100 mg Oral Daily  . DISCONTD: lisinopril  20 mg Per Tube Daily  . DISCONTD: potassium chloride  10 mEq Intravenous Q1 Hr x 2     Assessment/ Plan:   1. VDRF- Re-intubated for progressive respiratory distress and worsening LLL ATX. Underwent bronchoscopy and BAL samples sent for microbiology/cultures- data pending. Will stop diflucan given potential for interaction with amiodarone. 2. ESRD TTS/[facility]- dialysis Tuesday-acute HD needs noted for today. 3. PNA/bacteremia/UTI/Cdiff- on PO Vancomycin for C diff colitis and Meropenem for HCAP (psudomonas from prior respiratory cultures)- afebrile with normal WBC counts 4.  Alcohol abuse- chronic and relapsed multiple times after attempts at rehab/abstinence. On scheduled clonazepam for management of withdrawal 5. Anemia:/ACDz and ABLA= hgb 8.1 today, on aranesp with extra dose given yesterday. 6. s/p PEA Arrest 7. HTN- Hypotensive on Carvedilol BID and now off clonidine/lisinopril       8.  Secondary Hyperparathyroidism=Ca=9.1 low albumin, Phosphorus 4.1. Restart binders when cleared to eat.    Zetta Bills, MD 02/16/2012, 8:49 AM

## 2012-02-16 NOTE — Progress Notes (Signed)
Name: [name] MRN: [id number] DOB: [birth year]    LOS: 21 Date of admit 01/26/2012  5:09 PM    PULMONARY / CRITICAL CARE MEDICINE  56 year old male with PMH of HTN, COPD, Hep C, polysubstance abuse, chronic recurrent pancreatitis, PUD and ESRD. Had PEA arrest on 6/24. Intubated, got emergent HD for hyperkalemia, also treated for shock (sepsis and acidosis). Found to have pseudomonas PNA & bacteremia, also strep agalactiae in blood and urine. Treated w/ antibiotics.Developed C diff on 6/26.  Respiratory status marginal. Required re-intubation on 7/6, self extubated 7/10.  Transferred to SDU 7/11 still had left sided atx. 7/12, back to ICU for worsening collapse on left and acute respiratory failure.   KEY SOCIAL HX Patient's sister, Jaan Fischel [phone]). Patient has brothers as well. Ms. Broner  Most involved and admitted to patient problems with non compliance and substance abuse   EVENTS 6/25 - increasing pressors, fluid bolus for low bp, hypoglycemic requiring D10 6/26 - agitation, some bleeding from upper airway, diarrhea overnight--> flexiseal 6/28 - Severe agitation requiring increased fent gtt overnight, Transfused 1 u on HD            CT abd  - fracture rt 6-8 ribs, splenomegaly, LLL consolidation 6/28 - precedex/ klonopin added with decreased fent gtt 7/01 - Not weanable due to agitation on WUA 7/2 - improved, extubated 7/3 - Tx to IM TS 7/6 - Hypertensive, tachycardic, respiratory distress, intubated 7/7- bronch for collapse 7/8- did not recollapse 7/9- self extubated, marginal 7/10- improved resp status, NIMV 7/12: acute on chronic resp failure due to recurrent plugging and collapse. Moved back to the ICU   SUBJECTIVE/OVERNIGHT/INTERVAL HX Developed AFib overnight - started on amiodarone, now back in sinus rhythm Had HD 7/13, 1000cc removed  Vital Signs: Temp:  [98.9 F (37.2 C)-100 F (37.8 C)] 100 F (37.8 C) (07/14 0800) Pulse Rate:  [81-170] 95   (07/14 0900) Resp:  [14-23] 17  (07/14 0900) BP: (68-150)/(40-94) 116/56 mmHg (07/14 0900) SpO2:  [93 %-100 %] 100 % (07/14 0900) FiO2 (%):  [40 %-50 %] 40 % (07/14 0900) Weight:  [65.1 kg (143 lb 8.3 oz)-66.6 kg (146 lb 13.2 oz)] 65.1 kg (143 lb 8.3 oz) (07/14 0445)   Physical Examination: Gen: intubated, sedated HEENT: NCAT PULM: more rhonchus and now w/ increased accessory muscle use.  CV; RRR, systolic murmur Ab: BS+, soft, nontender Ext: warm, no edema Neuro: follows commands, moves all ext  ASSESSMENT AND PLAN:    Patient Active Problem List  Diagnosis  . SUBSTANCE ABUSE, MULTIPLE  . PEPTIC ULCER DISEASE  . Chronic recurrent pancreatitis  . End stage renal disease on dialysis  . Alcohol abuse  . COPD (chronic obstructive pulmonary disease)  . Neuropathy  . Protein malnutrition  . PEA (Pulseless electrical activity)  . Acute respiratory failure with hypoxia  . Hyperkalemia  . Bacteremia due to Pseudomonas  . Sepsis due to Streptococcus, group B  . C. difficile colitis  . Anemia  . Thrombocytopenia   See significant events note on 7/12 for ICU course.   PULMONARY  Lab 02/16/12 0412 02/15/12 0430 02/14/12 1656 02/14/12 1428 02/12/12 0615  PHART 7.427 7.430 7.446 7.477* 7.427  PCO2ART 46.5* 42.3 41.3 36.9 43.6  PO2ART 62.6* 68.0* 136.0* 61.3* 68.2*  HCO3 30.1* 27.9* 28.5* 27.0* 28.3*  O2SAT 93.6 93.0 99.0 93.5 93.8    CXR: 7/13 >> improvement in LLL collapse compared with priors ETT:  7/1>>>7/3  7/6>>>7/10 self extub  A:  Acute On Chronic Respiratory due to LLL Aspiration pna d/t pseudomonas, complicated by underlying COPD & Fracture rt 6-8 ribs with cpr.   P:   -MV, work towards PSV but suspect he will need trach due to failure to manage secretions.  -sedation protocol -f/u CXR  CARDIOVASCULAR  Lab 02/11/12 1607 02/11/12 1010 02/11/12 0407 02/10/12 2207 02/10/12 1555  TROPONINI <0.30 <0.30 <0.30 <0.30 <0.30  LATICACIDVEN -- -- -- -- --    PROBNP -- -- -- -- --   ECG:  Peak T waves, Sinus rhythm at admit on 6/23 -> normalized 6/24 Lines: Left IJ CVL 6/24 >> 7/3              A: History of hypertension -BP significantly elevated  P:  -tele  -cont home meds  -HD per renal  A: A Fib P:  - complete amiodarone load and then decide whether to continue   RENAL  Lab 02/16/12 0237 02/15/12 1450 02/15/12 0430 02/13/12 0500 02/11/12 0407  NA 134* 134* 138 128* 133*  K 3.3* 4.1 -- -- --  CL 97 99 102 92* 97  CO2 29 25 26 23 22   BUN 12 30* 23 26* 40*  CREATININE 2.92* 5.76* 5.17* 5.28* 6.55*  CALCIUM 9.1 9.2 9.2 8.8 9.1  MG 1.9 -- 2.0 1.8 --  PHOS 4.1 5.9* 5.5* 7.4* --   Intake/Output      07/13 0701 - 07/14 0700 07/14 0701 - 07/15 0700   I.V. (mL/kg) 1287 (19.8) 95.6 (1.5)   NG/GT 1020 180   IV Piggyback 340    Total Intake(mL/kg) 2647 (40.7) 275.6 (4.2)   Other 1000    Stool 375    Total Output 1375    Net +1272 +275.6         Foley:  6/23 >> out  A:   ESRD on hemodialysis   P:   -HD per Nephrology  GASTROINTESTINAL  Lab 02/15/12 1450 02/15/12 0430 02/11/12 0407  AST -- 23 33  ALT -- 12 16  ALKPHOS -- 107 143*  BILITOT -- 0.3 0.4  PROT -- 7.8 8.0  ALBUMIN 1.8* 1.8* 1.9*    A:  History of PUD, CDIFF Colitis with pos C Diff PCR  P:   -oral vanco -->see ID  HEMATOLOGIC  Lab 02/16/12 0237 02/15/12 1452 02/15/12 0430 02/13/12 1205 02/11/12 0407  HGB 8.1* 7.8* 7.8* 8.4* 8.3*  HCT 25.4* 24.6* 24.7* 25.0* 25.4*  PLT 459* 454* 452* 395 282  INR -- -- -- -- --  APTT -- -- -- -- --   A:  Anemic of critical illness / Thrombocytopenia  Stable, PLT improving P:  sub q hep  INFECTIOUS  Lab 02/16/12 0237 02/15/12 1452 02/15/12 0430 02/13/12 1205 02/11/12 0407  WBC 6.1 5.8 6.3 4.8 6.1  PROCALCITON -- -- 1.39 -- --   Cultures:  6/23 urine >> strep agalactiae 6/23 resp >>strep agalactiae 6/23 bld >> pseudomonas, strep agalactiae 6/26 C diff pCR POS 6/27 blood X 2  >> neg  7/6 Sputum  >>>rare candida 7/7 BAL bronch>>>few speudomonas, pansens 7/12 BAL>>>  ABx   Pip/ Tazo 6/23 >> 6/27 cipro 6/23 >>6/27 vanc 6/23 >> 6/27 Cefepime 6/27 (pseudomonas, Strep agalactiae)>>7/9 clinical failure? Meropenem 7/9>>> PO vanc 6/27 (CDIFF)>> Flagyl 6/27 (CDIFF)>>7/3 Vanc (HCAP) >>off  A:   Pseudomonas sepsis, d/t LLL PNA d/t pseudomonas History of Hep C Group B Strep Agalactiae bacteremia C. Diff colitis Mucus plug, likely HCAP on  7/6 -? Clinical failure to cefipime, with collapse, Concern to limit duration after 13 days already of abx with cdiff--> however CXR still remarkably abnormal.  P:  -cont current oral vanc for C diff  -for now cont merrem   ENDOCRINE  Lab 02/16/12 0728 02/16/12 0342 02/15/12 2332 02/15/12 1939 02/15/12 1217  GLUCAP 94 98 107* 94 105*   A:   Hypoglycemia - resolved  P:   -check CBG's Q6 -add D5 maintenence while NPO  NEUROLOGIC  A:  Encephalopathy - agitated delirium, h/o polysubstance abuse. Now sedated  P:   -Klonopin TID -fent q1h for chest pain fx, splinting -Continuous sedation drips now.   BEST PRACTICE / DISPOSITION - Level of Care:  ICU - Primary Service:  PCCM - Consultants:  Nephrology, ID - Code Status:  Full code - Diet:  TF 7/8 - DVT Px:  SCDs - GI Px: H2 - Skin Integrity:  - Social / Family: no family 7/6 at bedside  CC time of 30 min.  Levy Pupa, MD, PhD 02/16/2012, 10:05 AM [facility] [phone] or if no answer [phone]

## 2012-02-17 ENCOUNTER — Inpatient Hospital Stay (HOSPITAL_COMMUNITY): Payer: Medicare Other

## 2012-02-17 LAB — GLUCOSE, CAPILLARY
Glucose-Capillary: 93 mg/dL (ref 70–99)
Glucose-Capillary: 96 mg/dL (ref 70–99)
Glucose-Capillary: 109 mg/dL — ABNORMAL HIGH (ref 70–99)
Glucose-Capillary: 101 mg/dL — ABNORMAL HIGH (ref 70–99)

## 2012-02-17 LAB — CBC
MCV: 97.5 fL (ref 78.0–100.0)
Platelets: 363 10*3/uL (ref 150–400)
RDW: 15.4 % (ref 11.5–15.5)
WBC: 6 10*3/uL (ref 4.0–10.5)

## 2012-02-17 LAB — CULTURE, BAL-QUANTITATIVE W GRAM STAIN: Colony Count: 2000

## 2012-02-17 LAB — BASIC METABOLIC PANEL
CO2: 26 mEq/L (ref 19–32)
Calcium: 9.2 mg/dL (ref 8.4–10.5)
Creatinine, Ser: 4.72 mg/dL — ABNORMAL HIGH (ref 0.50–1.35)

## 2012-02-17 MED ORDER — HEPARIN SODIUM (PORCINE) 1000 UNIT/ML DIALYSIS: 2000.0000 [IU] | INTRAMUSCULAR | Status: DC | PRN

## 2012-02-17 NOTE — Progress Notes (Signed)
Subjective: On vent, no new events overnight  Objective Vital signs in last 24 hours: Filed Vitals:   02/17/12 0630 02/17/12 0700 02/17/12 0800 02/17/12 0841  BP: 116/60 122/65    Pulse: 65 62  71  Temp:   98 F (36.7 C)   TempSrc:      Resp: 18 18  18   Height:      Weight:      SpO2: 98% 98%  97%   Weight change: 2.2 kg (4 lb 13.6 oz)  Intake/Output Summary (Last 24 hours) at 02/17/12 0928 Last data filed at 02/17/12 0900  Gross per 24 hour  Intake 2201.32 ml  Output    300 ml  Net 1901.32 ml   Labs: Basic Metabolic Panel:  Lab 02/17/12 1478 02/16/12 0237 02/15/12 1450 02/15/12 0430 02/13/12 0500 02/11/12 0407  NA 135 134* 134* 138 128* 133*  K 3.6 3.3* 4.1 4.0 3.5 4.2  CL 98 97 99 102 92* 97  CO2 26 29 25 26 23 22   GLUCOSE 103* 118* 122* 107* 98 96  BUN 27* 12 30* 23 26* 40*  CREATININE 4.72* 2.92* 5.76* 5.17* 5.28* 6.55*  ALB -- -- -- -- -- --  CALCIUM 9.2 9.1 9.2 9.2 8.8 9.1  PHOS -- 4.1 5.9* 5.5* 7.4* --   Liver Function Tests:  Lab 02/15/12 1450 02/15/12 0430 02/11/12 0407  AST -- 23 33  ALT -- 12 16  ALKPHOS -- 107 143*  BILITOT -- 0.3 0.4  PROT -- 7.8 8.0  ALBUMIN 1.8* 1.8* 1.9*   No results found for this basename: LIPASE:3,AMYLASE:3 in the last 168 hours No results found for this basename: AMMONIA:3 in the last 168 hours CBC:  Lab 02/17/12 0345 02/16/12 0237 02/15/12 1452 02/15/12 0430 02/11/12 0407  WBC 6.0 6.1 5.8 6.3 --  NEUTROABS -- -- -- -- 3.7  HGB 7.4* 8.1* 7.8* 7.8* --  HCT 23.5* 25.4* 24.6* 24.7* --  MCV 97.5 97.7 97.6 97.2 --  PLT 363 459* 454* 452* --   PT/INR: @labrcntip (inr:5) Cardiac Enzymes:  Lab 02/11/12 1607 02/11/12 1010 02/11/12 0407 02/10/12 2207 02/10/12 1555  CKTOTAL 19 21 19 18 16   CKMB 3.2 3.0 2.6 2.4 2.3  CKMBINDEX -- -- -- -- --  TROPONINI <0.30 <0.30 <0.30 <0.30 <0.30   CBG:  Lab 02/17/12 0831 02/17/12 0355 02/16/12 2330 02/16/12 2001 02/16/12 1529  GLUCAP 96 93 109* 103* 105*    Iron Studies: No  results found for this basename: IRON:30,TIBC:30,TRANSFERRIN:30,FERRITIN:30 in the last 168 hours  Physical Exam:  Blood pressure 122/65, pulse 71, temperature 98 F (36.7 C), temperature source Oral, resp. rate 18, height 6' (1.829 m), weight 68.8 kg (151 lb 10.8 oz), SpO2 97.00%.  Gen: Intubated, awake and attempts to mouth words  GNF:AOZHY RRR, normal S1 and S2  Resp:Coarse BS bilaterally QMV:HQIO, flat, NT, BS normal  Ext:No LE edema, LUA access +thrill   Assessment/Plan  1. VDRF- back on vent, +PNA, no vol overload. On IV abx.  2. ESRD TTS/[facility]- dialysis Tuesday-no acute HD needs noted for today. No vol excess.  3. PNA/bacteremia/UTI/Cdiff- on PO Vancomycin for C diff colitis and Meropenem for HCAP (psudomonas from prior respiratory cultures)- afebrile with normal WBC counts 4. Alcohol abuse- chronic and relapsed multiple times after attempts at rehab/abstinence. On scheduled clonazepam tid for management of withdrawal 5. Anemia:/ACDz and ABLA= hgb 8.1 today, on aranesp with extra dose given 7/13. 6. s/p PEA Arrest 7. HTN- Hypotensive on Carvedilol 6.25 BID and  now off clonidine/lisinopril 8. Secondary Hyperparathyroidism- Ca=9.1 low albumin,             Phosphorus 4.1. Restart binders when cleared to eat.  Vinson Moselle  MD [employer] [phone] pgr    [phone] cell 02/17/2012, 9:28 AM

## 2012-02-17 NOTE — Progress Notes (Signed)
Name: [name] MRN: [id number] DOB: [birth year]    LOS: 22 Date of admit 01/26/2012  5:09 PM    PULMONARY / CRITICAL CARE MEDICINE  56 year old male with PMH of HTN, COPD, Hep C, polysubstance abuse, chronic recurrent pancreatitis, PUD and ESRD. Had PEA arrest on 6/24. Intubated, got emergent HD for hyperkalemia, also treated for shock (sepsis and acidosis). Found to have pseudomonas PNA & bacteremia, also strep agalactiae in blood and urine. Treated w/ antibiotics.Developed C diff on 6/26.  Respiratory status marginal. Required re-intubation on 7/6, self extubated 7/10.  Transferred to SDU 7/11 still had left sided atx. 7/12, back to ICU for worsening collapse on left and acute respiratory failure.   KEY SOCIAL HX Patient's sister, Sayge Salvato [phone]). Patient has brothers as well. Ms. Hyams  Most involved and admits to patient problems with non compliance and substance abuse   EVENTS 6/25 - increasing pressors, fluid bolus for low bp, hypoglycemic requiring D10 6/26 - agitation, some bleeding from upper airway, diarrhea overnight--> flexiseal 6/28 - Severe agitation requiring increased fent gtt overnight, Transfused 1 u on HD            CT abd  - fracture rt 6-8 ribs, splenomegaly, LLL consolidation 6/28 - precedex/ klonopin added with decreased fent gtt 7/01 - Not weanable due to agitation on WUA 7/2 - improved, extubated 7/3 - Tx to IM TS 7/6 - Hypertensive, tachycardic, respiratory distress, intubated 7/7- bronch for collapse 7/8- did not recollapse 7/9- self extubated, marginal 7/10- improved resp status, NIMV 7/12: acute on chronic resp failure due to recurrent plugging and collapse. Moved back to the ICU   SUBJECTIVE/OVERNIGHT/INTERVAL HX Remains in NSR  Vital Signs: Temp:  [98 F (36.7 C)-100.3 F (37.9 C)] 98 F (36.7 C) (07/15 0800) Pulse Rate:  [62-93] 71  (07/15 0841) Resp:  [16-20] 18  (07/15 0841) BP: (93-127)/(43-95) 122/65 mmHg (07/15  0700) SpO2:  [97 %-100 %] 97 % (07/15 0841) FiO2 (%):  [40 %] 40 % (07/15 0841) Weight:  [68.8 kg (151 lb 10.8 oz)] 68.8 kg (151 lb 10.8 oz) (07/15 0400)   Physical Examination: Gen: intubated, sedated HEENT: NCAT PULM: more rhonchus and now w/ increased accessory muscle use.  CV; RRR, systolic murmur Ab: BS+, soft, nontender Ext: warm, no edema Neuro: follows commands, moves all ext  ASSESSMENT AND PLAN:    Patient Active Problem List  Diagnosis  . SUBSTANCE ABUSE, MULTIPLE  . PEPTIC ULCER DISEASE  . Chronic recurrent pancreatitis  . End stage renal disease on dialysis  . Alcohol abuse  . COPD (chronic obstructive pulmonary disease)  . Neuropathy  . Protein malnutrition  . PEA (Pulseless electrical activity)  . Acute respiratory failure with hypoxia  . Hyperkalemia  . Bacteremia due to Pseudomonas  . Sepsis due to Streptococcus, group B  . C. difficile colitis  . Anemia  . Thrombocytopenia   See significant events note on 7/12 for ICU course.   PULMONARY  Lab 02/16/12 0412 02/15/12 0430 02/14/12 1656 02/14/12 1428 02/12/12 0615  PHART 7.427 7.430 7.446 7.477* 7.427  PCO2ART 46.5* 42.3 41.3 36.9 43.6  PO2ART 62.6* 68.0* 136.0* 61.3* 68.2*  HCO3 30.1* 27.9* 28.5* 27.0* 28.3*  O2SAT 93.6 93.0 99.0 93.5 93.8    CXR: 7/13 >> improvement in LLL collapse compared with priors ETT:  7/1>>>7/3            7/6>>>7/10 self extub  7/12 >>   A:  Acute On Chronic Respiratory due  to LLL Aspiration pna d/t pseudomonas, complicated by underlying COPD & Fracture rt 6-8 ribs with cpr.   P:   -MV, work towards PSV but suspect he will need trach due to failure to manage secretions.  -sedation protocol -f/u CXR  CARDIOVASCULAR  Lab 02/11/12 1607 02/11/12 1010 02/11/12 0407 02/10/12 2207 02/10/12 1555  TROPONINI <0.30 <0.30 <0.30 <0.30 <0.30  LATICACIDVEN -- -- -- -- --  PROBNP -- -- -- -- --   ECG:  Peak T waves, Sinus rhythm at admit on 6/23 -> normalized 6/24 Lines:  Left IJ CVL 6/24 >> 7/3              A: History of hypertension -BP significantly elevated  P:  -tele  -cont home meds  -HD per renal  A: A Fib P:  - completed amiodarone load; will stop the amio and follow for recurrence before deciding whether he needs long term PO amio (suspect this event due to stress of acute illness)   RENAL  Lab 02/17/12 0345 02/16/12 0237 02/15/12 1450 02/15/12 0430 02/13/12 0500  NA 135 134* 134* 138 128*  K 3.6 3.3* -- -- --  CL 98 97 99 102 92*  CO2 26 29 25 26 23   BUN 27* 12 30* 23 26*  CREATININE 4.72* 2.92* 5.76* 5.17* 5.28*  CALCIUM 9.2 9.1 9.2 9.2 8.8  MG -- 1.9 -- 2.0 1.8  PHOS -- 4.1 5.9* 5.5* 7.4*   Intake/Output      07/14 0701 - 07/15 0700 07/15 0701 - 07/16 0700   I.V. (mL/kg) 1147.5 (16.7) 89.4 (1.3)   NG/GT 870 60   IV Piggyback 330    Total Intake(mL/kg) 2347.5 (34.1) 149.4 (2.2)   Other     Stool 300    Total Output 300    Net +2047.5 +149.4         Foley:  6/23 >> out  A:   ESRD on hemodialysis   P:   -HD per Nephrology  GASTROINTESTINAL  Lab 02/15/12 1450 02/15/12 0430 02/11/12 0407  AST -- 23 33  ALT -- 12 16  ALKPHOS -- 107 143*  BILITOT -- 0.3 0.4  PROT -- 7.8 8.0  ALBUMIN 1.8* 1.8* 1.9*    A:  History of PUD, CDIFF Colitis with pos C Diff PCR  P:   -oral vanco completed  HEMATOLOGIC  Lab 02/17/12 0345 02/16/12 0237 02/15/12 1452 02/15/12 0430 02/13/12 1205  HGB 7.4* 8.1* 7.8* 7.8* 8.4*  HCT 23.5* 25.4* 24.6* 24.7* 25.0*  PLT 363 459* 454* 452* 395  INR -- -- -- -- --  APTT -- -- -- -- --   A:  Anemic of critical illness / Thrombocytopenia  Stable, PLT improving P:  sub q hep  INFECTIOUS  Lab 02/17/12 0345 02/16/12 0237 02/15/12 1452 02/15/12 0430 02/13/12 1205  WBC 6.0 6.1 5.8 6.3 4.8  PROCALCITON -- -- -- 1.39 --   Cultures:  6/23 urine >> strep agalactiae 6/23 resp >>strep agalactiae 6/23 bld >> pseudomonas, strep agalactiae 6/26 C diff pCR POS 6/27 blood X 2  >> neg  7/6 Sputum  >>>rare candida 7/7 BAL bronch>>>few speudomonas, pansens 7/12 BAL>>> 2000cfu Pseudomonas  ABx   Pip/ Tazo 6/23 >> 6/27 cipro 6/23 >>6/27 vanc 6/23 >> 6/27 Cefepime 6/27 (pseudomonas, Strep agalactiae)>>7/9 clinical failure? Meropenem 7/9>>>  PO vanc 6/27 (CDIFF)>> 7/15 Flagyl 6/27 (CDIFF)>>7/3 Vanc (HCAP) >>off  A:   Pseudomonas sepsis, d/t LLL PNA d/t pseudomonas History of Hep C Group B  Strep Agalactiae bacteremia C. Diff colitis Mucus plug, likely HCAP on 7/6 -? Clinical failure to cefipime, with collapse, Concern to limit duration after 13 days already of abx with cdiff--> however CXR still remarkably abnormal.  P:  -stop oral vanco, has had 20 days as on 7/15 -for now cont merrem   ENDOCRINE  Lab 02/17/12 0831 02/17/12 0355 02/16/12 2330 02/16/12 2001 02/16/12 1529  GLUCAP 96 93 109* 103* 105*   A:   Hypoglycemia - resolved  P:   -check CBG's Q6 -add D5 maintenence while NPO  NEUROLOGIC  A:  Encephalopathy - agitated delirium, h/o polysubstance abuse. Now sedated  P:   -Klonopin TID -fent q1h for chest pain fx, splinting -Continuous sedation drips now.   BEST PRACTICE / DISPOSITION - Level of Care:  ICU - Primary Service:  PCCM - Consultants:  Nephrology, ID - Code Status:  Full code - Diet:  TF 7/8 - DVT Px:  SCDs - GI Px: H2 - Skin Integrity:  - Social / Family: no family 7/15 at bedside  CC time of 30 min.  Levy Pupa, MD, PhD 02/17/2012, 10:03 AM  Pulmonary and Critical Care [phone] or if no answer [phone]

## 2012-02-17 NOTE — Progress Notes (Signed)
ANTIBIOTIC CONSULT NOTE - follow-up  Pharmacy Consult:  Meropenem Indication:  Sepsis, pseudomonas PNA/bacteremia, group B strep bacteremia, now HCAP  Allergies  Allergen Reactions  . Penicillins     "childhood reaction"    Patient Measurements: Height: 6' (182.9 cm) Weight: 151 lb 10.8 oz (68.8 kg) IBW/kg (Calculated) : 77.6    Vital Signs: Temp: 98.9 F (37.2 C) (07/15 0400) Temp src: Oral (07/15 0400) BP: 122/65 mmHg (07/15 0700) Pulse Rate: 62  (07/15 0700)  Labs:  Basename 02/17/12 0345 02/16/12 0237 02/15/12 1452 02/15/12 1450  WBC 6.0 6.1 5.8 --  HGB 7.4* 8.1* 7.8* --  PLT 363 459* 454* --  LABCREA -- -- -- --  CREATININE 4.72* 2.92* -- 5.76*   Estimated Creatinine Clearance: 16.8 ml/min (by C-G formula based on Cr of 4.72).   Assessment: 63 YOM was brought in by EMS with PEA arrest.  Patient with sepsis d/t Pseudomonal PNA/bacteremia and Group B strep bacteremia, and HCAP previously on vancomycin and Cefepime.  Antibiotic adjusted to Merrem monotherapy  day#18 of antibiotics/Day#7 meropenem.  He also has ESRD with his HD schedule now on TTS.   Patient also continues on vancomycin PO for C.diff colitis.  Vanc 7/7 >> 7/9 Cefepime 6/28 >> 7/9, clinical failure Flagyl 6/27 >> 7/3 Vanc PO 6/27>> 7/11, restarted 7/13>> Merrem 7/9 >>  Flucon 7/12 >>7/14  6/23 urine >> group B strep 6/23 blood cx from A-line >> Pseudomonas 6/23 blooc cx from L IJ >> group B strep, CNS 6/26 TA >>pseudomonas 6/27 C diff PCR>> positive 6/27 BCX >>Negative  6/27 right leg wound>> Negative. 7/6 sputum >> rare candida albicans  7/7 TA>> Few pseudomonas (pan-susc), few C. Albicans  Plan:  - Merrem 500mg  IV Q24H, give post HD on HD days - Monitor clinical course and length of meropenem therapy  Harland German, Pharm D 02/17/2012 8:22 AM

## 2012-02-18 ENCOUNTER — Inpatient Hospital Stay (HOSPITAL_COMMUNITY): Payer: Medicare Other

## 2012-02-18 LAB — CBC
Hemoglobin: 7.3 g/dL — ABNORMAL LOW (ref 13.0–17.0)
MCHC: 31.3 g/dL (ref 30.0–36.0)
Platelets: 352 10*3/uL (ref 150–400)
RBC: 2.41 MIL/uL — ABNORMAL LOW (ref 4.22–5.81)

## 2012-02-18 LAB — BASIC METABOLIC PANEL
BUN: 42 mg/dL — ABNORMAL HIGH (ref 6–23)
Calcium: 9.4 mg/dL (ref 8.4–10.5)
GFR calc Af Amer: 10 mL/min — ABNORMAL LOW (ref >90)
GFR calc non Af Amer: 9 mL/min — ABNORMAL LOW (ref >90)
Potassium: 4 mEq/L (ref 3.5–5.1)
Sodium: 137 mEq/L (ref 135–145)

## 2012-02-18 LAB — GLUCOSE, CAPILLARY
Glucose-Capillary: 77 mg/dL (ref 70–99)
Glucose-Capillary: 81 mg/dL (ref 70–99)
Glucose-Capillary: 87 mg/dL (ref 70–99)
Glucose-Capillary: 88 mg/dL (ref 70–99)
Glucose-Capillary: 97 mg/dL (ref 70–99)

## 2012-02-18 MED ORDER — CLONAZEPAM 1 MG PO TABS
2.0000 mg | ORAL_TABLET | Freq: Three times a day (TID) | ORAL | Status: DC
  Administered 2012-02-18 – 2012-02-21 (×10): 2 mg
  Filled 2012-02-18 (×11): qty 2

## 2012-02-18 MED ORDER — FENTANYL 50 MCG/HR TD PT72
100.0000 ug | MEDICATED_PATCH | TRANSDERMAL | Status: DC
  Administered 2012-02-18 – 2012-02-21 (×2): 100 ug via TRANSDERMAL
  Filled 2012-02-18 (×2): qty 2

## 2012-02-18 MED ORDER — NEPRO/CARBSTEADY PO LIQD
1000.0000 mL | ORAL | Status: DC
  Administered 2012-02-18 – 2012-02-20 (×3): 1000 mL
  Filled 2012-02-18 (×4): qty 1000

## 2012-02-18 MED ORDER — ALBUTEROL SULFATE HFA 108 (90 BASE) MCG/ACT IN AERS
6.0000 | INHALATION_SPRAY | Freq: Four times a day (QID) | RESPIRATORY_TRACT | Status: DC
  Administered 2012-02-19 – 2012-02-21 (×6): 6 via RESPIRATORY_TRACT
  Filled 2012-02-18 (×3): qty 6.7

## 2012-02-18 NOTE — Progress Notes (Signed)
Nutrition Follow-up  Intervention:   1. Increase Nepro goal rate to 35 ml/hr 2. Continue Pro-stat supplement at TID  This will provide 1812 kcal (101%), 113 gm protein (100%) and 610 ml free water.  3. RD will continue to follow and make TF adjustments as needed.  Assessment:   Pt remains intubated and sedated with IHD. Pt unable to manages secretions likely will need trach placed. Recommend PEG placement when trach placed. Currently continues on TF.  Per RN, pt tolerating TF well, no residuals. Obtaining consent for trach, procedure likely this weekend.   Diet Order:  NPO TF: Nepro @ 30 ml/hr  Supplements:30 ml Pro-stat via tube, TID Residuals: none documented  Propofol: none  Current EN regimen is providing 1596 kcal, 103 gm protein, and 523 ml free water.  Meeting 89% of kcal needs and 100% protein needs.    Meds: Scheduled Meds:   . albuterol  2.5 mg Nebulization Q6H  . antiseptic oral rinse  15 mL Mouth Rinse QID  . carvedilol  6.25 mg Per Tube BID WC  . chlorhexidine  15 mL Mouth Rinse BID  . clonazePAM  2 mg Per Tube TID  . darbepoetin (ARANESP) injection - DIALYSIS  200 mcg Intravenous Q Sat-HD  . famotidine  20 mg Per Tube Daily  . feeding supplement (NEPRO CARB STEADY)  1,000 mL Per Tube Q24H  . feeding supplement  30 mL Oral TID WC  . fentaNYL  100 mcg Transdermal Q72H  . heparin subcutaneous  5,000 Units Subcutaneous Q8H  . meropenem (MERREM) IV  500 mg Intravenous Q24H  . mupirocin cream  1 application Topical BID  . DISCONTD: clonazePAM  1 mg Per Tube TID   Continuous Infusions:   . sodium chloride 10 mL/hr (02/18/12 0529)  . fentaNYL infusion INTRAVENOUS 300 mcg/hr (02/18/12 0444)  . midazolam (VERSED) infusion 8 mg/hr (02/18/12 0557)  . DISCONTD: amiodarone (NEXTERONE PREMIX) 360 mg/200 mL dextrose 0.5 mg/min (02/16/12 2000)   PRN Meds:.sodium chloride, acetaminophen, albuterol, fentaNYL, heparin, labetalol, lidocaine, lidocaine-prilocaine, midazolam,  pentafluoroprop-tetrafluoroeth  Labs:  CMP     Component Value Date/Time   NA 137 02/18/2012 0530   K 4.0 02/18/2012 0530   CL 99 02/18/2012 0530   CO2 25 02/18/2012 0530   GLUCOSE 96 02/18/2012 0530   BUN 42* 02/18/2012 0530   CREATININE 6.39* 02/18/2012 0530   CREATININE 8.17* 10/15/2011 1709   CALCIUM 9.4 02/18/2012 0530   CALCIUM 7.6* 04/04/2011 0315   PROT 7.8 02/15/2012 0430   ALBUMIN 1.8* 02/15/2012 1450   AST 23 02/15/2012 0430   ALT 12 02/15/2012 0430   ALKPHOS 107 02/15/2012 0430   BILITOT 0.3 02/15/2012 0430   GFRNONAA 9* 02/18/2012 0530   GFRAA 10* 02/18/2012 0530   Magnesium  Date/Time Value Range Status  02/16/2012  2:37 AM 1.9  1.5 - 2.5 mg/dL Final   Phosphorus  Date/Time Value Range Status  02/16/2012  2:37 AM 4.1  2.3 - 4.6 mg/dL Final      Intake/Output Summary (Last 24 hours) at 02/18/12 1013 Last data filed at 02/18/12 0800  Gross per 24 hour  Intake   1655 ml  Output      0 ml  Net   1655 ml    Weight Status:  150 lbs, trending up Admission weight: 162 lbs Dry weight: 152.9 lbs   Ve: 9.8, observed while in room.  Tmax: 37.2 C  Re-estimated needs:  1792 kcal, 100-120 gm protein  Nutrition Dx:  Inadequate  oral intake, ongoing   Goal:  Enteral nutrition tolerance, met  Monitor:  Weight, EN tolerance, labs   Clarene Duke RD, LDN Pager [phone] After Hours pager [phone]

## 2012-02-18 NOTE — Procedures (Signed)
I was present at this dialysis session. I have reviewed the session itself and made appropriate changes.   Vinson Moselle, MD [employer] 02/18/2012, 12:50 PM

## 2012-02-18 NOTE — Progress Notes (Signed)
Name: [name] MRN: [id number] DOB: 25-Apr-1956    LOS: 23 Date of admit 01/26/2012  5:09 PM    PULMONARY / CRITICAL CARE MEDICINE  56 year old male with PMH of HTN, COPD, Hep C, polysubstance abuse, chronic recurrent pancreatitis, PUD and ESRD. Had PEA arrest on 6/24. Intubated, got emergent HD for hyperkalemia, also treated for shock (sepsis and acidosis). Found to have pseudomonas PNA & bacteremia, also strep agalactiae in blood and urine. Treated w/ antibiotics.Developed C diff on 6/26.  Respiratory status marginal. Required re-intubation on 7/6, self extubated 7/10.  Transferred to SDU 7/11 still had left sided atx. 7/12, back to ICU for worsening collapse on left and acute respiratory failure.   KEY SOCIAL HX Patient's sister, [name] [phone]). Patient has brothers as well. [name]  Most involved and admits to patient problems with non compliance and substance abuse   EVENTS 6/25 - increasing pressors, fluid bolus for low bp, hypoglycemic requiring D10 6/26 - agitation, some bleeding from upper airway, diarrhea overnight--> flexiseal 6/28 - Severe agitation requiring increased fent gtt overnight, Transfused 1 u on HD            CT abd  - fracture rt 6-8 ribs, splenomegaly, LLL consolidation 6/28 - precedex/ klonopin added with decreased fent gtt 7/01 - Not weanable due to agitation on WUA 7/2 - improved, extubated 7/3 - Tx to IM TS 7/6 - Hypertensive, tachycardic, respiratory distress, intubated 7/7- bronch for collapse 7/8- did not recollapse 7/9- self extubated, marginal 7/10- improved resp status, NIMV 7/12: acute on chronic resp failure due to recurrent plugging and collapse. Moved back to the ICU   SUBJECTIVE/OVERNIGHT/INTERVAL HX Remains in NSR  Vital Signs: Temp:  [98.3 F (36.8 C)-98.9 F (37.2 C)] 98.6 F (37 C) (07/16 0800) Pulse Rate:  [67-104] 91  (07/16 0809) Resp:  [14-24] 23  (07/16 0809) BP: (106-158)/(53-98) 149/85 mmHg (07/16  0809) SpO2:  [94 %-100 %] 99 % (07/16 0809) FiO2 (%):  [40 %] 40 % (07/16 0809) Weight:  [68.3 kg (150 lb 9.2 oz)] 68.3 kg (150 lb 9.2 oz) (07/16 0500)   Physical Examination: Gen: intubated, sedated HEENT: NCAT PULM: more rhonchus and now w/ increased accessory muscle use.  CV; RRR, systolic murmur Ab: BS+, soft, nontender Ext: warm, no edema Neuro: follows commands, moves all ext  ASSESSMENT AND PLAN:  Patient Active Problem List  Diagnosis  . SUBSTANCE ABUSE, MULTIPLE  . PEPTIC ULCER DISEASE  . Chronic recurrent pancreatitis  . End stage renal disease on dialysis  . Alcohol abuse  . COPD (chronic obstructive pulmonary disease)  . Neuropathy  . Protein malnutrition  . PEA (Pulseless electrical activity)  . Acute respiratory failure with hypoxia  . Hyperkalemia  . Bacteremia due to Pseudomonas  . Sepsis due to Streptococcus, group B  . C. difficile colitis  . Anemia  . Thrombocytopenia   PULMONARY  Lab 02/16/12 0412 02/15/12 0430 02/14/12 1656 02/14/12 1428 02/12/12 0615  PHART 7.427 7.430 7.446 7.477* 7.427  PCO2ART 46.5* 42.3 41.3 36.9 43.6  PO2ART 62.6* 68.0* 136.0* 61.3* 68.2*  HCO3 30.1* 27.9* 28.5* 27.0* 28.3*  O2SAT 93.6 93.0 99.0 93.5 93.8   CXR:  7/13 >> improvement in LLL collapse compared with priors ETT:   7/1>>>7/3  7/6>>>7/10 self extub  7/12 >>   A:  Acute On Chronic Respiratory due to LLL Aspiration pna d/t pseudomonas, complicated by underlying COPD & Fracture rt 6-8 ribs with cpr.   P:   -  PS trials today but will need trach due to failure to manage secretions issue is that there is no family to sign consent or tell us if patient would want this kind of care.  -Sedation protocol. -F/U CXR.  CARDIOVASCULAR  Lab 02/11/12 1607 02/11/12 1010  TROPONINI <0.30 <0.30  LATICACIDVEN -- --  PROBNP -- --   ECG:  Peak T waves, Sinus rhythm at admit on 6/23 -> normalized 6/24 Lines: Left IJ CVL 6/24 >> 7/3              A: History of hypertension  -BP significantly elevated  P:  -Tele. -Cont home meds. -HD per renal, intermittent HD.  A: A Fib P:  -Completed amiodarone load; stopped amio and follow for recurrence before deciding whether he needs long term PO amio (suspect this event due to stress of acute illness)  RENAL  Lab 02/18/12 0530 02/17/12 0345 02/16/12 0237 02/15/12 1450 02/15/12 0430 02/13/12 0500  NA 137 135 134* 134* 138 --  K 4.0 3.6 -- -- -- --  CL 99 98 97 99 102 --  CO2 25 26 29 25 26  --  BUN 42* 27* 12 30* 23 --  CREATININE 6.39* 4.72* 2.92* 5.76* 5.17* --  CALCIUM 9.4 9.2 9.1 9.2 9.2 --  MG -- -- 1.9 -- 2.0 1.8  PHOS -- -- 4.1 5.9* 5.5* 7.4*   Intake/Output      07/15 0701 - 07/16 0700 07/16 0701 - 07/17 0700   I.V. (mL/kg) 887.1 (13) 38 (0.6)   NG/GT 1050 90   IV Piggyback     Total Intake(mL/kg) 1937.1 (28.4) 128 (1.9)   Stool     Total Output     Net +1937.1 +128         Foley:  6/23 >> out  A:   ESRD on hemodialysis   P:   -HD per Nephrology  GASTROINTESTINAL  Lab 02/15/12 1450 02/15/12 0430  AST -- 23  ALT -- 12  ALKPHOS -- 107  BILITOT -- 0.3  PROT -- 7.8  ALBUMIN 1.8* 1.8*    A:  History of PUD, CDIFF Colitis with pos C Diff PCR  P:   -Oral vanco completed  HEMATOLOGIC  Lab 02/18/12 0530 02/17/12 0345 02/16/12 0237 02/15/12 1452 02/15/12 0430  HGB 7.3* 7.4* 8.1* 7.8* 7.8*  HCT 23.3* 23.5* 25.4* 24.6* 24.7*  PLT 352 363 459* 454* 452*  INR -- -- -- -- --  APTT -- -- -- -- --   A:  Anemic of critical illness / Thrombocytopenia  Stable, PLT improving P:  sub q hep  INFECTIOUS  Lab 02/18/12 0530 02/17/12 0345 02/16/12 0237 02/15/12 1452 02/15/12 0430  WBC 5.5 6.0 6.1 5.8 6.3  PROCALCITON -- -- -- -- 1.39   Cultures:  6/23 urine >> strep agalactiae 6/23 resp >>strep agalactiae 6/23 bld >> pseudomonas, strep agalactiae 6/26 C diff pCR POS 6/27 blood X 2  >> neg  7/6 Sputum >>>rare candida 7/7 BAL bronch>>>few speudomonas, pansens 7/12 BAL>>> 2000cfu  Pseudomonas  ABx   Pip/ Tazo 6/23 >> 6/27 cipro 6/23 >>6/27 vanc 6/23 >> 6/27 Cefepime 6/27 (pseudomonas, Strep agalactiae)>>7/9 clinical failure? Meropenem 7/9>>>  PO vanc 6/27 (CDIFF)>> 7/15 Flagyl 6/27 (CDIFF)>>7/3 Vanc (HCAP) >>off  A:   Pseudomonas sepsis, d/t LLL PNA d/t pseudomonas History of Hep C Group B Strep Agalactiae bacteremia C. Diff colitis Mucus plug, likely HCAP on 7/6 -? Clinical failure to cefipime, with collapse, Concern to limit duration after 13  days already of abx with cdiff--> however CXR still remarkably abnormal.  P:  -stopped oral vanco 7/15, has had 20 days -For now cont merrem  ENDOCRINE  Lab 02/18/12 0739 02/18/12 0326 02/17/12 2345 02/17/12 2001 02/17/12 1556  GLUCAP 82 75 88 101* 83   A:   Hypoglycemia - resolved  P:   -Check CBG's Q6. -Continue TF.  NEUROLOGIC  A:  Encephalopathy - agitated delirium, h/o polysubstance abuse. Now sedated  P:   -Increase Klonopin to 2 mg TID -Fent q1h for chest pain fx, splinting -Continuous sedation drips now. -Add fentanyl patch.  Will involve social work to see if we can get family for consent for tracheostomy and see if ex-wife is an appropriate person to contact for consents.  Evidently daughter wants nothing to do with him.  CC time of 30 min.  Alyson Reedy, M.D. [facility] Pulmonary/Critical Care Medicine. Pager: [phone]. After hours pager: [phone].

## 2012-02-18 NOTE — Progress Notes (Signed)
Subjective: On vent, no new events overnight  Objective Vital signs in last 24 hours: Filed Vitals:   02/18/12 1200 02/18/12 1203 02/18/12 1215 02/18/12 1230  BP: 143/83 143/83 144/90 145/88  Pulse:  85 88 84  Temp:      TempSrc:      Resp:  18 17 18   Height:      Weight:      SpO2:  99% 99% 99%   Weight change: -0.5 kg (-1 lb 1.6 oz)  Intake/Output Summary (Last 24 hours) at 02/18/12 1251 Last data filed at 02/18/12 1000  Gross per 24 hour  Intake   1603 ml  Output      0 ml  Net   1603 ml   Labs: Basic Metabolic Panel:  Lab 02/18/12 9562 02/17/12 0345 02/16/12 0237 02/15/12 1450 02/15/12 0430 02/13/12 0500  NA 137 135 134* 134* 138 128*  K 4.0 3.6 3.3* 4.1 4.0 3.5  CL 99 98 97 99 102 92*  CO2 25 26 29 25 26 23   GLUCOSE 96 103* 118* 122* 107* 98  BUN 42* 27* 12 30* 23 26*  CREATININE 6.39* 4.72* 2.92* 5.76* 5.17* 5.28*  ALB -- -- -- -- -- --  CALCIUM 9.4 9.2 9.1 9.2 9.2 8.8  PHOS -- -- 4.1 5.9* 5.5* 7.4*   Liver Function Tests:  Lab 02/15/12 1450 02/15/12 0430  AST -- 23  ALT -- 12  ALKPHOS -- 107  BILITOT -- 0.3  PROT -- 7.8  ALBUMIN 1.8* 1.8*   No results found for this basename: LIPASE:3,AMYLASE:3 in the last 168 hours No results found for this basename: AMMONIA:3 in the last 168 hours CBC:  Lab 02/18/12 0530 02/17/12 0345 02/16/12 0237 02/15/12 1452  WBC 5.5 6.0 6.1 5.8  NEUTROABS -- -- -- --  HGB 7.3* 7.4* 8.1* 7.8*  HCT 23.3* 23.5* 25.4* 24.6*  MCV 96.7 97.5 97.7 97.6  PLT 352 363 459* 454*   PT/INR: @labrcntip (inr:5) Cardiac Enzymes:  Lab 02/11/12 1607  CKTOTAL 19  CKMB 3.2  CKMBINDEX --  TROPONINI <0.30   CBG:  Lab 02/18/12 0739 02/18/12 0326 02/17/12 2345 02/17/12 2001 02/17/12 1556  GLUCAP 82 75 88 101* 83    Iron Studies: No results found for this basename: IRON:30,TIBC:30,TRANSFERRIN:30,FERRITIN:30 in the last 168 hours  Physical Exam:  Blood pressure 145/88, pulse 84, temperature 96.6 F (35.9 C), temperature source Oral,  resp. rate 18, height 6' (1.829 m), weight 68.3 kg (150 lb 9.2 oz), SpO2 99.00%.  Gen: Intubated, awake and attempts to mouth words  ZHY:QMVHQ RRR, normal S1 and S2  Resp:Coarse BS bilaterally ION:GEXB, flat, NT, BS normal  Ext:No LE edema, LUA access +thrill   Assessment/Plan 1. VDRF- back on vent, +PNA, mucous plugging, will need trach soon per CCM.  On IV abx w Merrem.  2. ESRD TTS/[facility]- dialysis today, max UF with HD, weight up but no pulm edema. 3. Cdiff- completed 20d course of PO Vancomycin 4. PNA- Meropenem for HCAP (psudomonas from prior respiratory cultures)- afebrile with normal WBC counts 5. Alcohol abuse- chronic and relapsed multiple times after attempts at rehab/abstinence. On scheduled clonazepam tid for management of withdrawal 6. Anemia:/ACDz and ABLA= hgb 8.1 today, on aranesp with extra dose given 7/13. 7. s/p PEA Arrest 8. HTN- BP up on Carvedilol 6.25 BID,  off clonidine/lisinopril. Pull vol w HD today 9. Secondary Hyperparathyroidism- Ca=9.1 low albumin, phosphorus 4.1. Restart binders when cleared to eat.  Vinson Moselle  MD [employer]  [phone] pgr    [phone] cell 02/18/2012, 12:51 PM

## 2012-02-19 ENCOUNTER — Inpatient Hospital Stay (HOSPITAL_COMMUNITY): Payer: Medicare Other

## 2012-02-19 LAB — BLOOD GAS, ARTERIAL
Acid-Base Excess: 3.4 mmol/L — ABNORMAL HIGH (ref 0.0–2.0)
Bicarbonate: 28.4 mEq/L — ABNORMAL HIGH (ref 20.0–24.0)
Drawn by: 36277
FIO2: 0.4 %
TCO2: 30 mmol/L (ref 0–100)
pCO2 arterial: 51.6 mmHg — ABNORMAL HIGH (ref 35.0–45.0)
pO2, Arterial: 68.9 mmHg — ABNORMAL LOW (ref 80.0–100.0)

## 2012-02-19 LAB — BASIC METABOLIC PANEL
Chloride: 96 mEq/L (ref 96–112)
GFR calc Af Amer: 18 mL/min — ABNORMAL LOW (ref >90)
GFR calc non Af Amer: 16 mL/min — ABNORMAL LOW (ref >90)
Potassium: 3.4 mEq/L — ABNORMAL LOW (ref 3.5–5.1)
Sodium: 136 mEq/L (ref 135–145)

## 2012-02-19 LAB — CBC
MCHC: 31.8 g/dL (ref 30.0–36.0)
RDW: 15.3 % (ref 11.5–15.5)
WBC: 4.1 10*3/uL (ref 4.0–10.5)

## 2012-02-19 LAB — PHOSPHORUS: Phosphorus: 5.5 mg/dL — ABNORMAL HIGH (ref 2.3–4.6)

## 2012-02-19 LAB — GLUCOSE, CAPILLARY
Glucose-Capillary: 80 mg/dL (ref 70–99)
Glucose-Capillary: 113 mg/dL — ABNORMAL HIGH (ref 70–99)

## 2012-02-19 LAB — MAGNESIUM: Magnesium: 2.1 mg/dL (ref 1.5–2.5)

## 2012-02-19 MED ORDER — HEPARIN SODIUM (PORCINE) 1000 UNIT/ML DIALYSIS: 2000.0000 [IU] | INTRAMUSCULAR | Status: DC | PRN

## 2012-02-19 NOTE — Progress Notes (Signed)
Name: [name] MRN: [id number] DOB: Aug 21, 1955    LOS: 24 Date of admit 01/26/2012  5:09 PM    PULMONARY / CRITICAL CARE MEDICINE  56 year old male with PMH of HTN, COPD, Hep C, polysubstance abuse, chronic recurrent pancreatitis, PUD and ESRD. Had PEA arrest on 6/24. Intubated, got emergent HD for hyperkalemia, also treated for shock (sepsis and acidosis). Found to have pseudomonas PNA & bacteremia, also strep agalactiae in blood and urine. Treated w/ antibiotics.Developed C diff on 6/26.  Respiratory status marginal. Required re-intubation on 7/6, self extubated 7/10.  Transferred to SDU 7/11 still had left sided atx. 7/12, back to ICU for worsening collapse on left and acute respiratory failure.   KEY SOCIAL HX Patient's sister, Kanaan Kagawa [phone]). Patient has brothers as well. Ms. Climer  Most involved and admits to patient problems with non compliance and substance abuse   EVENTS 6/25 - increasing pressors, fluid bolus for low bp, hypoglycemic requiring D10 6/26 - agitation, some bleeding from upper airway, diarrhea overnight--> flexiseal 6/28 - Severe agitation requiring increased fent gtt overnight, Transfused 1 u on HD            CT abd  - fracture rt 6-8 ribs, splenomegaly, LLL consolidation 6/28 - precedex/ klonopin added with decreased fent gtt 7/01 - Not weanable due to agitation on WUA 7/2 - improved, extubated 7/3 - Tx to IM TS 7/6 - Hypertensive, tachycardic, respiratory distress, intubated 7/7- bronch for collapse 7/8- did not recollapse 7/9- self extubated, marginal 7/10- improved resp status, NIMV 7/12: acute on chronic resp failure due to recurrent plugging and collapse. Moved back to the ICU   SUBJECTIVE/OVERNIGHT/INTERVAL HX Remains in NSR  Vital Signs: Temp:  [96.4 F (35.8 C)-98.7 F (37.1 C)] 97.3 F (36.3 C) (07/17 0800) Pulse Rate:  [71-106] 81  (07/17 0900) Resp:  [15-22] 18  (07/17 0900) BP: (99-182)/(53-107) 117/60 mmHg (07/17  0900) SpO2:  [94 %-100 %] 97 % (07/17 0900) FiO2 (%):  [40 %] 40 % (07/17 0811) Weight:  [64.4 kg (141 lb 15.6 oz)-68.3 kg (150 lb 9.2 oz)] 66.5 kg (146 lb 9.7 oz) (07/17 0500)   Physical Examination: Gen: intubated, sedated HEENT: NCAT PULM: more rhonchus and now w/ increased accessory muscle use.  CV; RRR, systolic murmur Ab: BS+, soft, nontender Ext: warm, no edema Neuro: follows commands, moves all ext  ASSESSMENT AND PLAN:  Patient Active Problem List  Diagnosis  . SUBSTANCE ABUSE, MULTIPLE  . PEPTIC ULCER DISEASE  . Chronic recurrent pancreatitis  . End stage renal disease on dialysis  . Alcohol abuse  . COPD (chronic obstructive pulmonary disease)  . Neuropathy  . Protein malnutrition  . PEA (Pulseless electrical activity)  . Acute respiratory failure with hypoxia  . Hyperkalemia  . Bacteremia due to Pseudomonas  . Sepsis due to Streptococcus, group B  . C. difficile colitis  . Anemia  . Thrombocytopenia   PULMONARY  Lab 02/19/12 0404 02/16/12 0412 02/15/12 0430 02/14/12 1656 02/14/12 1428  PHART 7.360 7.427 7.430 7.446 7.477*  PCO2ART 51.6* 46.5* 42.3 41.3 36.9  PO2ART 68.9* 62.6* 68.0* 136.0* 61.3*  HCO3 28.4* 30.1* 27.9* 28.5* 27.0*  O2SAT 91.5 93.6 93.0 99.0 93.5   CXR:  7/13 >> improvement in LLL collapse compared with priors ETT:   7/1>>>7/3  7/6>>>7/10 self extub  7/12 >>   A:  Acute On Chronic Respiratory due to LLL Aspiration pna d/t pseudomonas, complicated by underlying COPD & Fracture rt 6-8 ribs with cpr.  P:   -High PS trials today but seriously doubt will be able to maintain or protect his airway, will need trach, see discussion below.  -Sedation protocol but decrease for weaning.Marland Kitchen -F/U CXR.  CARDIOVASCULAR No results found for this basename: TROPONINI:5,LATICACIDVEN:5, O2SATVEN:5,PROBNP:5 in the last 168 hours ECG:  Peak T waves, Sinus rhythm at admit on 6/23 -> normalized 6/24 Lines: Left IJ CVL 6/24 >> 7/3              A: History of  hypertension -BP much improved.  P:  -Tele. -Cont home meds. -HD per renal, intermittent HD.  A: A Fib P:  -Completed amiodarone load; stopped amio and follow for recurrence before deciding whether he needs long term PO amio (suspect this event due to stress of acute illness).  RENAL  Lab 02/19/12 0500 02/18/12 0530 02/17/12 0345 02/16/12 0237 02/15/12 1450 02/15/12 0430 02/13/12 0500  NA 136 137 135 134* 134* -- --  K 3.4* 4.0 -- -- -- -- --  CL 96 99 98 97 99 -- --  CO2 27 25 26 29 25  -- --  BUN 21 42* 27* 12 30* -- --  CREATININE 3.87* 6.39* 4.72* 2.92* 5.76* -- --  CALCIUM 9.3 9.4 9.2 9.1 9.2 -- --  MG 2.1 -- -- 1.9 -- 2.0 1.8  PHOS 5.5* -- -- 4.1 5.9* 5.5* 7.4*   Intake/Output      07/16 0701 - 07/17 0700 07/17 0701 - 07/18 0700   I.V. (mL/kg) 1022 (15.4) 76 (1.1)   Other 30    NG/GT 885 70   Total Intake(mL/kg) 1937 (29.1) 146 (2.2)   Other -4500    Stool 600    Total Output -3900    Net +5837 +146         Foley:  6/23 >> out A:   ESRD on hemodialysis   P:   -HD per Nephrology, dialysis on 7/18.  GASTROINTESTINAL  Lab 02/15/12 1450 02/15/12 0430  AST -- 23  ALT -- 12  ALKPHOS -- 107  BILITOT -- 0.3  PROT -- 7.8  ALBUMIN 1.8* 1.8*   A:  History of PUD, CDIFF Colitis with pos C Diff PCR  P:   -Oral vanco completed.  HEMATOLOGIC  Lab 02/19/12 0500 02/18/12 0530 02/17/12 0345 02/16/12 0237 02/15/12 1452  HGB 8.4* 7.3* 7.4* 8.1* 7.8*  HCT 26.4* 23.3* 23.5* 25.4* 24.6*  PLT 353 352 363 459* 454*  INR -- -- -- -- --  APTT -- -- -- -- --   A:  Anemic of critical illness / Thrombocytopenia  Stable, PLT improving P:  Sub q hep.  INFECTIOUS  Lab 02/19/12 0500 02/18/12 0530 02/17/12 0345 02/16/12 0237 02/15/12 1452 02/15/12 0430  WBC 4.1 5.5 6.0 6.1 5.8 --  PROCALCITON -- -- -- -- -- 1.39   Cultures:  6/23 urine >> strep agalactiae 6/23 resp >>strep agalactiae 6/23 bld >> pseudomonas, strep agalactiae 6/26 C diff pCR POS 6/27 blood X 2  >>  neg  7/6 Sputum >>>rare candida 7/7 BAL bronch>>>few speudomonas, pansens 7/12 BAL>>> 2000cfu Pseudomonas  ABx   Pip/ Tazo 6/23 >> 6/27 cipro 6/23 >>6/27 vanc 6/23 >> 6/27 Cefepime 6/27 (pseudomonas, Strep agalactiae)>>7/9 clinical failure? Meropenem 7/9>>>  PO vanc 6/27 (CDIFF)>> 7/15 Flagyl 6/27 (CDIFF)>>7/3 Vanc (HCAP) >>off  A:   Pseudomonas sepsis, d/t LLL PNA d/t pseudomonas History of Hep C Group B Strep Agalactiae bacteremia C. Diff colitis Mucus plug, likely HCAP on 7/6 -? Clinical failure to cefipime,  with collapse, Concern to limit duration after 13 days already of abx with cdiff--> however CXR still remarkably abnormal.  P:  -stopped oral vanco 7/15, has had 20 days -For now cont merrem  ENDOCRINE  Lab 02/19/12 0326 02/19/12 0324 02/18/12 2341 02/18/12 1930 02/18/12 1744  GLUCAP 80 26* 77 87 81   A:   Hypoglycemia - resolved  P:   -Check CBG's Q6. -Continue TF.  NEUROLOGIC  A:  Encephalopathy - agitated delirium, h/o polysubstance abuse. Now more arousable but not enough to make any decisions. P:   -Increased Klonopin to 2 mg TID -Continuous sedation drips now. -Fentanyl patch at 100 mcg.Marland Kitchen  Spoke with the patient's ex-wife today.  She is now unsure if patient would want a trach/peg/SNF placement.  She is conferring with the daughter who evidently now wants to be involved.  They are coming in later today for a meeting to decide plan of care.  Will hold off trach for now until meeting with the family.  CC time of 35 min.  Alyson Reedy, M.D. [facility] Pulmonary/Critical Care Medicine. Pager: [phone]. After hours pager: [phone].

## 2012-02-19 NOTE — Progress Notes (Signed)
Had an extensive conversation with daughter and ex-wife.  After discussion, decided to make patient a full NCB.  He evidently has told the family that he would not want to be trach/peg since a friend of his has had that done and lives in a SNF and patient has mentioned more than once that he would rather be dead than live like that.  I was very direct with the family that mentally the patient has moments (however short) of lucidity and that if trached he will likely wean over time and can be decannulated.  However, they were very clear that the patient has mentioned it on multiple occassions that he would rather be dead than live that way.  He evidently has mentioned to his ex-wife and daughter that he wanted to be a DNR but he never got the paper work finalized.  Given that, will attempt to optimize the patient then extubate when family is ready with no intention to re-intubate.  Will hold sedation first prior to extubation.  Additional CC time of 25 min.  Alyson Reedy, M.D. [facility] Pulmonary/Critical Care Medicine. Pager: [phone]. After hours pager: [phone].

## 2012-02-19 NOTE — Progress Notes (Signed)
Subjective: On vent, sedated with versed and fentanyl. 4.5kg off with HD yest, BP's lower.   Objective Vital signs in last 24 hours: Filed Vitals:   02/19/12 0600 02/19/12 0700 02/19/12 0800 02/19/12 0811  BP: 121/63 138/85 131/72   Pulse: 80 84 87 79  Temp:   97.3 F (36.3 C)   TempSrc:   Oral   Resp: 18 18 18 18   Height:      Weight:      SpO2: 99% 99% 99% 99%   Weight change: 0 kg (0 lb)  Intake/Output Summary (Last 24 hours) at 02/19/12 0836 Last data filed at 02/19/12 0800  Gross per 24 hour  Intake   1852 ml  Output  -3900 ml  Net   5752 ml   Labs: Basic Metabolic Panel:  Lab 02/19/12 4098 02/18/12 0530 02/17/12 0345 02/16/12 0237 02/15/12 1450 02/15/12 0430 02/13/12 0500  NA 136 137 135 134* 134* 138 128*  K 3.4* 4.0 3.6 3.3* 4.1 4.0 3.5  CL 96 99 98 97 99 102 92*  CO2 27 25 26 29 25 26 23   GLUCOSE 87 96 103* 118* 122* 107* 98  BUN 21 42* 27* 12 30* 23 26*  CREATININE 3.87* 6.39* 4.72* 2.92* 5.76* 5.17* 5.28*  ALB -- -- -- -- -- -- --  CALCIUM 9.3 9.4 9.2 9.1 9.2 9.2 8.8  PHOS 5.5* -- -- 4.1 5.9* 5.5* 7.4*   Liver Function Tests:  Lab 02/15/12 1450 02/15/12 0430  AST -- 23  ALT -- 12  ALKPHOS -- 107  BILITOT -- 0.3  PROT -- 7.8  ALBUMIN 1.8* 1.8*   No results found for this basename: LIPASE:3,AMYLASE:3 in the last 168 hours No results found for this basename: AMMONIA:3 in the last 168 hours CBC:  Lab 02/19/12 0500 02/18/12 0530 02/17/12 0345 02/16/12 0237  WBC 4.1 5.5 6.0 6.1  NEUTROABS -- -- -- --  HGB 8.4* 7.3* 7.4* 8.1*  HCT 26.4* 23.3* 23.5* 25.4*  MCV 96.0 96.7 97.5 97.7  PLT 353 352 363 459*   PT/INR: @labrcntip (inr:5) Cardiac Enzymes: No results found for this basename: CKTOTAL:5,CKMB:5,CKMBINDEX:5,TROPONINI:5 in the last 168 hours CBG:  Lab 02/19/12 0326 02/19/12 0324 02/18/12 2341 02/18/12 1930 02/18/12 1744  GLUCAP 80 26* 77 87 81    Iron Studies: No results found for this basename: IRON:30,TIBC:30,TRANSFERRIN:30,FERRITIN:30 in  the last 168 hours  Physical Exam:  Blood pressure 131/72, pulse 79, temperature 97.3 F (36.3 C), temperature source Oral, resp. rate 18, height 6' (1.829 m), weight 66.5 kg (146 lb 9.7 oz), SpO2 99.00%.  Gen: Intubated, awake and attempts to mouth words  JXB:JYNWG RRR, normal S1 and S2  Resp:Coarse BS bilaterally NFA:OZHY, flat, NT, BS normal  Ext:No LE edema, LUA access +thrill   Assessment/Plan 1. VDRF- back on vent, +PNA, mucous plugging, will need trach soon per CCM.  On IV abx w Merrem.  2. ESRD TTS at [facility]- dialysis tomorrow. Volume controlled. New dry wt probably around 65kg (was 70.5).  3. Cdiff- completed 20d course of PO Vancomycin 4. PNA- Meropenem for HCAP (psudomonas from prior respiratory cultures)- afebrile with normal WBC counts 5. Alcohol abuse- chronic and relapsed multiple times after attempts at rehab/abstinence. On scheduled clonazepam tid for management of withdrawal 6. Anemia:/ACDz and ABLA= hgb 8.1 today, on aranesp with extra dose given 7/13. 7. s/p PEA Arrest 8. HTN- onCarvedilol 6.25 BID,  off clonidine/lisinopril. 9. Secondary Hyperparathyroidism- Ca=9.1 low albumin, phosphorus 4.1. Restart binders when cleared to eat.  Vinson Moselle  MD [facility] [phone] pgr    [phone] cell 02/19/2012, 8:36 AM

## 2012-02-20 ENCOUNTER — Inpatient Hospital Stay (HOSPITAL_COMMUNITY): Payer: Medicare Other

## 2012-02-20 ENCOUNTER — Encounter (HOSPITAL_COMMUNITY): Payer: Medicare Other

## 2012-02-20 LAB — CBC
HCT: 24.3 % — ABNORMAL LOW (ref 39.0–52.0)
MCHC: 31.7 g/dL (ref 30.0–36.0)
MCV: 95.3 fL (ref 78.0–100.0)
RDW: 15.2 % (ref 11.5–15.5)

## 2012-02-20 LAB — GLUCOSE, CAPILLARY
Glucose-Capillary: 92 mg/dL (ref 70–99)
Glucose-Capillary: 95 mg/dL (ref 70–99)
Glucose-Capillary: 91 mg/dL (ref 70–99)

## 2012-02-20 LAB — BASIC METABOLIC PANEL
BUN: 34 mg/dL — ABNORMAL HIGH (ref 6–23)
Creatinine, Ser: 5.48 mg/dL — ABNORMAL HIGH (ref 0.50–1.35)
GFR calc Af Amer: 12 mL/min — ABNORMAL LOW (ref >90)
GFR calc non Af Amer: 10 mL/min — ABNORMAL LOW (ref >90)

## 2012-02-20 LAB — APTT: aPTT: 39 seconds — ABNORMAL HIGH (ref 24–37)

## 2012-02-20 LAB — PROTIME-INR: INR: 1.02 (ref 0.00–1.49)

## 2012-02-20 MED ORDER — VECURONIUM BROMIDE 10 MG IV SOLR
10.0000 mg | Freq: Once | INTRAVENOUS | Status: DC
  Filled 2012-02-20: qty 10

## 2012-02-20 MED ORDER — ETOMIDATE 2 MG/ML IV SOLN
40.0000 mg | Freq: Once | INTRAVENOUS | Status: DC
  Filled 2012-02-20: qty 20

## 2012-02-20 MED ORDER — POTASSIUM CHLORIDE 20 MEQ/15ML (10%) PO LIQD
40.0000 meq | Freq: Once | ORAL | Status: AC
  Administered 2012-02-20: 40 meq
  Filled 2012-02-20: qty 30

## 2012-02-20 MED ORDER — PROPOFOL 10 MG/ML IV EMUL
50.0000 mL | Freq: Once | INTRAVENOUS | Status: DC
  Filled 2012-02-20 (×2): qty 50

## 2012-02-20 MED ORDER — POTASSIUM CHLORIDE 20 MEQ/15ML (10%) PO LIQD
ORAL | Status: AC
  Filled 2012-02-20: qty 30

## 2012-02-20 NOTE — Procedures (Signed)
Central Venous Catheter Insertion Procedure Note [name] [id number] 1956/04/14  Procedure: Insertion of Central Venous Catheter Indications: Drug and/or fluid administration  Procedure Details Consent: Unable to obtain consent because of altered level of consciousness. Time Out: Verified patient identification, verified procedure, site/side was marked, verified correct patient position, special equipment/implants available, medications/allergies/relevent history reviewed, required imaging and test results available.  Performed  Maximum sterile technique was used including antiseptics, cap, gloves, gown, hand hygiene, mask and sheet. Skin prep: Chlorhexidine; local anesthetic administered A antimicrobial bonded/coated triple lumen catheter was placed in the left internal jugular vein using the Seldinger technique.  Evaluation Blood flow good Complications: No apparent complications Patient did tolerate procedure well. Chest X-ray ordered to verify placement.  CXR: pending.  Lisa-Marie Rueger 02/20/2012, 1:26 PM

## 2012-02-20 NOTE — Procedures (Signed)
Bedside Tracheostomy Insertion Procedure Note   Patient Details:   Name: [name] DOB: 11-Jul-1956 MRN: [id number]  Procedure: Tracheostomy  Pre Procedure Assessment: ET Tube Size: ET Tube secured at lip (cm): Bite block in place: Yes Breath Sounds: Rhonch  Post Procedure Assessment: BP 135/63  Pulse 100  Temp 98.8 F (37.1 C) (Oral)  Resp 12  Ht 6' (1.829 m)  Wt 141 lb 12.1 oz (64.3 kg)  BMI 19.23 kg/m2  SpO2 100% O2 sats: stable throughout Complications: No apparent complications Patient did tolerate procedure well Tracheostomy Brand:Shiley Tracheostomy Style:Cuffed Tracheostomy Size: 8.0 Tracheostomy Secured JXB:[id number] Tracheostomy Placement Confirmation:Trach cuff visualized and in place    Kendall Flack Royal 02/20/2012, 2:39 PM

## 2012-02-20 NOTE — Progress Notes (Addendum)
ANTIBIOTIC CONSULT NOTE - follow-up  Pharmacy Consult:  Meropenem Indication:  Sepsis, pseudomonas PNA/bacteremia, group B strep bacteremia, now HCAP  Allergies  Allergen Reactions  . Penicillins     "childhood reaction"    Patient Measurements: Height: 6' (182.9 cm) Weight: 148 lb 5.9 oz (67.3 kg) IBW/kg (Calculated) : 77.6    Vital Signs: Temp: 98.7 F (37.1 C) (07/18 0800) Temp src: Oral (07/18 0800) BP: 162/102 mmHg (07/18 1015) Pulse Rate: 106  (07/18 1015)  Labs:  Basename 02/20/12 0325 02/19/12 0500 02/18/12 0530  WBC 3.6* 4.1 5.5  HGB 7.7* 8.4* 7.3*  PLT 328 353 352  LABCREA -- -- --  CREATININE 5.48* 3.87* 6.39*   Estimated Creatinine Clearance: 14.2 ml/min (by C-G formula based on Cr of 5.48).   Assessment: 28 YOM was brought in by EMS with PEA arrest.  Patient with sepsis d/t Pseudomonal PNA/bacteremia and Group B strep bacteremia, and HCAP previously on vancomycin and Cefepime.  Antibiotic adjusted to Merrem monotherapy  day#21 of antibiotics/Day#9 meropenem (with cefepime failure).  He also has ESRD with his HD schedule now on TTS.    Vanc 7/7 >> 7/9 Cefepime 6/28 >> 7/9, clinical failure Flagyl 6/27 >> 7/3 Vanc PO 6/27>> 7/11, restarted 7/13>>7/15 Merrem 7/9 >>  Flucon 7/12 >>7/14  6/23 urine >> group B strep 6/23 blood cx from A-line >> Pseudomonas 6/23 blooc cx from L IJ >> group B strep, CNS 6/26 TA >>pseudomonas 6/27 C diff PCR>> positive 6/27 BCX >>Negative  6/27 right leg wound>> Negative. 7/6 sputum >> rare candida albicans  7/7 TA>> Few pseudomonas (pan-susc), few C. Albicans  Plan:  - Merrem 500mg  IV Q24H, give post HD on HD days - Spoke with Dr. Molli Knock: Will plan for 14 days duration (end date 7/23)  Harland German, Pharm D 02/20/2012 10:37 AM

## 2012-02-20 NOTE — Progress Notes (Signed)
Bronch w/ video intervention performed, bronchial washing intervention performed.  Alyson Reedy, M.D. [facility] Pulmonary/Critical Care Medicine. Pager: [phone]. After hours pager: [phone].

## 2012-02-20 NOTE — Procedures (Signed)
Tracheostomy Placement  Consent from family, patient sedated and paralyzed, area cleaned, lidocaine injected, skin incision in place followed by blunt dissection, airway entered, wire placed and visualized under direct bronchoscopy, airway crushed then dilated, tracheostomy placed and sutured, seen above the carina by 4 cm.  CXR ordered and pending.  Alyson Reedy, M.D. [facility] Pulmonary/Critical Care Medicine. Pager: [phone]. After hours pager: [phone].

## 2012-02-20 NOTE — Procedures (Signed)
Bedside Tracheostomy Insertion Procedure Note   Patient Details:   Name: [name] DOB: 07-Mar-1956 MRN: [id number]  Procedure: Tracheostomy  Pre Procedure Assessment: ET Tube Size: ET Tube secured at lip (cm): Bite block in place: Yes Breath Sounds: Diminished  Post Procedure Assessment: BP 135/63  Pulse 100  Temp 98.8 F (37.1 C) (Oral)  Resp 12  Ht 6' (1.829 m)  Wt 141 lb 12.1 oz (64.3 kg)  BMI 19.23 kg/m2  SpO2 100% O2 sats: stable throughout Complications: No apparent complications Patient did tolerate procedure well Tracheostomy Brand:Shiley Tracheostomy Style:Cuffed Tracheostomy Size: 8.0 Tracheostomy Secured JYN:[id number] Tracheostomy Placement Confirmation:Trach cuff visualized and in place    Jennette Kettle 02/20/2012, 1:43 PM

## 2012-02-20 NOTE — Procedures (Signed)
Central Venous Catheter Insertion Procedure Note [name] [id number] 30-Jan-1956  Procedure: Change Central Venous Catheter over wire Indications: Drug and/or fluid administration and catheter unsecured, needed to be changed   Procedure Details Time Out: Verified patient identification, verified procedure, site/side was marked, verified correct patient position, special equipment/implants available, medications/allergies/relevent history reviewed, required imaging and test results available.  Performed  Maximum sterile technique was used including antiseptics, cap, gloves, gown, hand hygiene, mask and sheet. Skin prep: Chlorhexidine; local anesthetic administered A antimicrobial bonded/coated triple lumen catheter was placed in the left internal jugular vein over wire.  Evaluation Blood flow good Complications: No apparent complications Patient did tolerate procedure well. Chest X-ray ordered to verify placement.  CXR: pending.  Done by [name], NP  02/20/2012 1:45 PM

## 2012-02-20 NOTE — Progress Notes (Signed)
Name: [name] MRN: [id number] DOB: Jan 29, 1956    LOS: 25 Date of admit 01/26/2012  5:09 PM    PULMONARY / CRITICAL CARE MEDICINE  56 year old male with PMH of HTN, COPD, Hep C, polysubstance abuse, chronic recurrent pancreatitis, PUD and ESRD. Had PEA arrest on 6/24. Intubated, got emergent HD for hyperkalemia, also treated for shock (sepsis and acidosis). Found to have pseudomonas PNA & bacteremia, also strep agalactiae in blood and urine. Treated w/ antibiotics.Developed C diff on 6/26.  Respiratory status marginal. Required re-intubation on 7/6, self extubated 7/10.  Transferred to SDU 7/11 still had left sided atx. 7/12, back to ICU for worsening collapse on left and acute respiratory failure.   KEY SOCIAL HX Patient's sister, Keni Elison [phone]). Patient has brothers as well. Ms. Denardo  Most involved and admits to patient problems with non compliance and substance abuse   EVENTS 6/25 - increasing pressors, fluid bolus for low bp, hypoglycemic requiring D10 6/26 - agitation, some bleeding from upper airway, diarrhea overnight--> flexiseal 6/28 - Severe agitation requiring increased fent gtt overnight, Transfused 1 u on HD            CT abd  - fracture rt 6-8 ribs, splenomegaly, LLL consolidation 6/28 - precedex/ klonopin added with decreased fent gtt 7/01 - Not weanable due to agitation on WUA 7/2 - improved, extubated 7/3 - Tx to IM TS 7/6 - Hypertensive, tachycardic, respiratory distress, intubated 7/7- bronch for collapse 7/8- did not recollapse 7/9- self extubated, marginal 7/10- improved resp status, NIMV 7/12: acute on chronic resp failure due to recurrent plugging and collapse. Moved back to the ICU   SUBJECTIVE/OVERNIGHT/INTERVAL HX Remains in NSR  Vital Signs: Temp:  [97.6 F (36.4 C)-99 F (37.2 C)] 98.7 F (37.1 C) (07/18 0800) Pulse Rate:  [74-111] 106  (07/18 1015) Resp:  [14-21] 14  (07/18 1015) BP: (93-189)/(50-109) 162/102 mmHg (07/18  1015) SpO2:  [92 %-100 %] 99 % (07/18 1015) FiO2 (%):  [40 %-50 %] 50 % (07/18 1015) Weight:  [67.3 kg (148 lb 5.9 oz)] 67.3 kg (148 lb 5.9 oz) (07/18 6213)   Physical Examination: Gen: intubated, sedated HEENT: NCAT PULM: more rhonchus and now w/ increased accessory muscle use.  CV; RRR, systolic murmur Ab: BS+, soft, nontender Ext: warm, no edema Neuro: follows commands, moves all ext  ASSESSMENT AND PLAN:  Patient Active Problem List  Diagnosis  . SUBSTANCE ABUSE, MULTIPLE  . PEPTIC ULCER DISEASE  . Chronic recurrent pancreatitis  . End stage renal disease on dialysis  . Alcohol abuse  . COPD (chronic obstructive pulmonary disease)  . Neuropathy  . Protein malnutrition  . PEA (Pulseless electrical activity)  . Acute respiratory failure with hypoxia  . Hyperkalemia  . Bacteremia due to Pseudomonas  . Sepsis due to Streptococcus, group B  . C. difficile colitis  . Anemia  . Thrombocytopenia   PULMONARY  Lab 02/19/12 0404 02/16/12 0412 02/15/12 0430 02/14/12 1656 02/14/12 1428  PHART 7.360 7.427 7.430 7.446 7.477*  PCO2ART 51.6* 46.5* 42.3 41.3 36.9  PO2ART 68.9* 62.6* 68.0* 136.0* 61.3*  HCO3 28.4* 30.1* 27.9* 28.5* 27.0*  O2SAT 91.5 93.6 93.0 99.0 93.5   CXR:  7/13 >> improvement in LLL collapse compared with priors ETT:   7/1>>>7/3  7/6>>>7/10 self extub  7/12 >> 7/18  Trach (JY) 7/18>>>  A:  Acute On Chronic Respiratory due to LLL Aspiration pna d/t pseudomonas, complicated by underlying COPD & Fracture rt 6-8 ribs with cpr.  P:   -Continue PS trials. -Trach today.  -Sedation protocol but decrease for weaning but patient has extreme tolerance. -F/U CXR.  CARDIOVASCULAR No results found for this basename: TROPONINI:5,LATICACIDVEN:5, O2SATVEN:5,PROBNP:5 in the last 168 hours ECG:  Peak T waves, Sinus rhythm at admit on 6/23 -> normalized 6/24 Lines: Left IJ CVL 6/24 >> 7/3              A: History of hypertension -BP much improved.  P:  -Tele. -Cont  home meds. -HD per renal, intermittent HD.  A: A Fib P:  -Completed amiodarone load; stopped amio, no need for PO, patient has been stable.  RENAL  Lab 02/20/12 0325 02/19/12 0500 02/18/12 0530 02/17/12 0345 02/16/12 0237 02/15/12 1450 02/15/12 0430  NA 139 136 137 135 134* -- --  K 3.2* 3.4* -- -- -- -- --  CL 96 96 99 98 97 -- --  CO2 30 27 25 26 29  -- --  BUN 34* 21 42* 27* 12 -- --  CREATININE 5.48* 3.87* 6.39* 4.72* 2.92* -- --  CALCIUM 9.8 9.3 9.4 9.2 9.1 -- --  MG 2.4 2.1 -- -- 1.9 -- 2.0  PHOS 7.2* 5.5* -- -- 4.1 5.9* 5.5*   Intake/Output      07/17 0701 - 07/18 0700 07/18 0701 - 07/19 0700   I.V. (mL/kg) 532.9 (7.9)    Other     NG/GT 1070    Total Intake(mL/kg) 1602.9 (23.8)    Other     Stool     Total Output     Net +1602.9          Foley:  6/23 >> out A:   ESRD on hemodialysis   P:   -HD per Nephrology.  GASTROINTESTINAL  Lab 02/15/12 1450 02/15/12 0430  AST -- 23  ALT -- 12  ALKPHOS -- 107  BILITOT -- 0.3  PROT -- 7.8  ALBUMIN 1.8* 1.8*   A:  History of PUD, CDIFF Colitis with pos C Diff PCR  P:   -Oral vanco completed.  HEMATOLOGIC  Lab 02/20/12 0325 02/19/12 0500 02/18/12 0530 02/17/12 0345 02/16/12 0237  HGB 7.7* 8.4* 7.3* 7.4* 8.1*  HCT 24.3* 26.4* 23.3* 23.5* 25.4*  PLT 328 353 352 363 459*  INR -- -- -- -- --  APTT -- -- -- -- --   A:  Anemic of critical illness / Thrombocytopenia  Stable, PLT improving P:  Sub q hep.  INFECTIOUS  Lab 02/20/12 0325 02/19/12 0500 02/18/12 0530 02/17/12 0345 02/16/12 0237 02/15/12 0430  WBC 3.6* 4.1 5.5 6.0 6.1 --  PROCALCITON -- -- -- -- -- 1.39   Cultures:  6/23 urine >> strep agalactiae 6/23 resp >>strep agalactiae 6/23 bld >> pseudomonas, strep agalactiae 6/26 C diff pCR POS 6/27 blood X 2  >> neg  7/6 Sputum >>>rare candida 7/7 BAL bronch>>>few speudomonas, pansens 7/12 BAL>>> 2000cfu Pseudomonas  ABx   Pip/ Tazo 6/23 >> 6/27 cipro 6/23 >>6/27 vanc 6/23 >> 6/27 Cefepime  6/27 (pseudomonas, Strep agalactiae)>>7/9 clinical failure? Meropenem 7/9>>>  PO vanc 6/27 (CDIFF)>> 7/15 Flagyl 6/27 (CDIFF)>>7/3 Vanc (HCAP) >>off  A:   Pseudomonas sepsis, d/t LLL PNA d/t pseudomonas History of Hep C Group B Strep Agalactiae bacteremia C. Diff colitis Mucus plug, likely HCAP on 7/6 -? Clinical failure to cefipime, with collapse, Concern to limit duration after 13 days already of abx with cdiff--> however CXR still remarkably abnormal.  P:  -Stopped oral vanco 7/15, has had 20 days. -For  now cont merrem (will finish a 14 day course).  ENDOCRINE  Lab 02/20/12 0322 02/19/12 2355 02/19/12 1949 02/19/12 1616 02/19/12 1133  GLUCAP 107* 119* 106* 113* 102*   A:   Hypoglycemia - resolved  P:   -Check CBG's Q6. -Continue TF.  NEUROLOGIC  A:  Encephalopathy - agitated delirium, h/o polysubstance abuse. Now more arousable but not enough to make any decisions. P:   -Increased Klonopin to 2 mg TID -Continuous sedation drips now. -Fentanyl patch at 100 mcg. -Sedation will be a major ongoing issue.  Spoke with the patient's ex-wife, daughter and son in Social worker.  Patient is weaning very well today.  After discussion, decided to keep code status the same but proceed with the tracheostomy.  Will be done today at 1230 bedside.  CC time of 45 min.  Alyson Reedy, M.D. [facility] Pulmonary/Critical Care Medicine. Pager: [phone]. After hours pager: [phone].

## 2012-02-20 NOTE — Procedures (Signed)
I was present at this dialysis session. I have reviewed the session itself and made appropriate changes.   Vinson Moselle, MD [employer] 02/20/2012, 10:29 AM

## 2012-02-20 NOTE — Progress Notes (Signed)
Subjective: On vent, no new events overnight  Objective Vital signs in last 24 hours: Filed Vitals:   02/20/12 0845 02/20/12 0938 02/20/12 0940 02/20/12 0945  BP: 165/101 171/91  183/109  Pulse: 110  108 110  Temp:      TempSrc:      Resp: 17  15 18   Height:      Weight:      SpO2: 100%  100% 100%   Weight change: -1 kg (-2 lb 3.3 oz)  Intake/Output Summary (Last 24 hours) at 02/20/12 1017 Last data filed at 02/20/12 0700  Gross per 24 hour  Intake 1324.85 ml  Output      0 ml  Net 1324.85 ml   Labs: Basic Metabolic Panel:  Lab 02/20/12 1610 02/19/12 0500 02/18/12 0530 02/17/12 0345 02/16/12 0237 02/15/12 1450 02/15/12 0430  NA 139 136 137 135 134* 134* 138  K 3.2* 3.4* 4.0 3.6 3.3* 4.1 4.0  CL 96 96 99 98 97 99 102  CO2 30 27 25 26 29 25 26   GLUCOSE 104* 87 96 103* 118* 122* 107*  BUN 34* 21 42* 27* 12 30* 23  CREATININE 5.48* 3.87* 6.39* 4.72* 2.92* 5.76* 5.17*  ALB -- -- -- -- -- -- --  CALCIUM 9.8 9.3 9.4 9.2 9.1 9.2 9.2  PHOS 7.2* 5.5* -- -- 4.1 5.9* 5.5*   Liver Function Tests:  Lab 02/15/12 1450 02/15/12 0430  AST -- 23  ALT -- 12  ALKPHOS -- 107  BILITOT -- 0.3  PROT -- 7.8  ALBUMIN 1.8* 1.8*   No results found for this basename: LIPASE:3,AMYLASE:3 in the last 168 hours No results found for this basename: AMMONIA:3 in the last 168 hours CBC:  Lab 02/20/12 0325 02/19/12 0500 02/18/12 0530 02/17/12 0345  WBC 3.6* 4.1 5.5 6.0  NEUTROABS -- -- -- --  HGB 7.7* 8.4* 7.3* 7.4*  HCT 24.3* 26.4* 23.3* 23.5*  MCV 95.3 96.0 96.7 97.5  PLT 328 353 352 363   PT/INR: @labrcntip (inr:5) Cardiac Enzymes: No results found for this basename: CKTOTAL:5,CKMB:5,CKMBINDEX:5,TROPONINI:5 in the last 168 hours CBG:  Lab 02/20/12 0322 02/19/12 2355 02/19/12 1949 02/19/12 1616 02/19/12 1133  GLUCAP 107* 119* 106* 113* 102*    Iron Studies: No results found for this basename: IRON:30,TIBC:30,TRANSFERRIN:30,FERRITIN:30 in the last 168 hours  Physical Exam:  Blood  pressure 183/109, pulse 110, temperature 98.7 F (37.1 C), temperature source Oral, resp. rate 18, height 6' (1.829 m), weight 67.3 kg (148 lb 5.9 oz), SpO2 100.00%.  Gen: Intubated, awake and attempts to mouth words  RUE:AVWUJ RRR, normal S1 and S2  Resp:Coarse BS bilaterally WJX:BJYN, flat, NT, BS normal  Ext:No LE edema, LUA access +thrill   Assessment/Plan 1. VDRF- PNA, FTT. Per primary.   2. ESRD TTS/[facility]- HD today in progress, max UF as tolerated. Poor prognosis.  3. Cdiff- completed 20d course of PO Vancomycin 4. PNA- Meropenem for HCAP, +pseudomonas from cultures 5. Alcohol abuse- chronic and relapsed multiple times after attempts at rehab/abstinence. On scheduled clonazepam tid for management of withdrawal 6. Anemia:/ACDz and ABLA= hgb 7's, on aranesp with extra dose given 7/13. 7. s/p PEA Arrest 8. HTN- BP up on Carvedilol 6.25 BID,  off clonidine/lisinopril. 9. 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 Phos 5-7 range, Ca 9-10.   [name] Moselle  MD [facility] [phone] pgr    [phone] cell 02/20/2012, 10:17 AM

## 2012-02-20 NOTE — Procedures (Signed)
Bronchoscopy  for Percutaneous  Tracheostomy  Name: MALEKO GREULICH MRN: [id number] DOB: 1956-07-06 Procedure: Bronchoscopy for Percutaneous Tracheostomy Indications: Diagnostic evaluation of the airways and Obtain specimens for culture and/or other diagnostic studies In conjunction with: Dr.Yacoub  Procedure Details Consent: Risks of procedure as well as the alternatives and risks of each were explained to the (patient/caregiver).  Consent for procedure obtained. Time Out: Verified patient identification, verified procedure, site/side was marked, verified correct patient position, special equipment/implants available, medications/allergies/relevent history reviewed, required imaging and test results available.  Performed  In preparation for procedure, patient was given 100% FiO2 and bronchoscope lubricated. Sedation: Benzodiazepines, Muscle relaxants, Etomidate and Short-acting barbiturates  Airway entered and the following bronchi were examined: RUL, RML, RLL, LUL, LLL and Bronchi.   Procedures performed: Endotracheal Tube retracted in 2 cm increments. Cannulation of airway observed. Dilation observed. Placement of trachel tube  observed . No overt complications. Bronchoscope removed.    Evaluation Hemodynamic Status: BP stable throughout; O2 sats: stable throughout Patient's Current Condition: stable Specimens:  Sent serosanguinous fluid Complications: No apparent complications Patient did tolerate procedure well.   Brett Canales Minor ACNP [facility] Pager [phone] till 3 pm If no answer page [phone] 02/20/2012, 1:12 PM   Coralyn Helling, MD [facility] Pulmonary/Critical Care 02/20/2012, 2:21 PM Pager:  [phone] After 3pm call: [phone]

## 2012-02-21 ENCOUNTER — Inpatient Hospital Stay (HOSPITAL_COMMUNITY): Payer: Medicare Other

## 2012-02-21 ENCOUNTER — Inpatient Hospital Stay
Admission: AD | Admit: 2012-02-21 | Discharge: 2012-04-03 | Disposition: A | Discharge status: Skilled Nursing Facility | Payer: Medicare Other | Source: Ambulatory Visit | Attending: Internal Medicine | Admitting: Internal Medicine

## 2012-02-21 DIAGNOSIS — J449 Chronic obstructive pulmonary disease, unspecified: Secondary | ICD-10-CM

## 2012-02-21 DIAGNOSIS — A0472 Enterocolitis due to Clostridium difficile, not specified as recurrent: Secondary | ICD-10-CM

## 2012-02-21 DIAGNOSIS — A401 Sepsis due to streptococcus, group B: Secondary | ICD-10-CM

## 2012-02-21 DIAGNOSIS — J9620 Acute and chronic respiratory failure, unspecified whether with hypoxia or hypercapnia: Secondary | ICD-10-CM | POA: Diagnosis present

## 2012-02-21 DIAGNOSIS — N186 End stage renal disease: Secondary | ICD-10-CM

## 2012-02-21 DIAGNOSIS — Z992 Dependence on renal dialysis: Secondary | ICD-10-CM

## 2012-02-21 DIAGNOSIS — R7881 Bacteremia: Secondary | ICD-10-CM

## 2012-02-21 DIAGNOSIS — Z9911 Dependence on respirator [ventilator] status: Secondary | ICD-10-CM

## 2012-02-21 DIAGNOSIS — J9601 Acute respiratory failure with hypoxia: Secondary | ICD-10-CM

## 2012-02-21 DIAGNOSIS — D649 Anemia, unspecified: Secondary | ICD-10-CM

## 2012-02-21 DIAGNOSIS — F1010 Alcohol abuse, uncomplicated: Secondary | ICD-10-CM

## 2012-02-21 DIAGNOSIS — E875 Hyperkalemia: Secondary | ICD-10-CM

## 2012-02-21 DIAGNOSIS — K279 Peptic ulcer, site unspecified, unspecified as acute or chronic, without hemorrhage or perforation: Secondary | ICD-10-CM

## 2012-02-21 DIAGNOSIS — K861 Other chronic pancreatitis: Secondary | ICD-10-CM

## 2012-02-21 DIAGNOSIS — Z930 Tracheostomy status: Secondary | ICD-10-CM

## 2012-02-21 HISTORY — DX: Splenomegaly, not elsewhere classified: R16.1

## 2012-02-21 LAB — BASIC METABOLIC PANEL
Calcium: 10 mg/dL (ref 8.4–10.5)
Creatinine, Ser: 3.96 mg/dL — ABNORMAL HIGH (ref 0.50–1.35)
GFR calc Af Amer: 18 mL/min — ABNORMAL LOW (ref >90)
GFR calc non Af Amer: 15 mL/min — ABNORMAL LOW (ref >90)

## 2012-02-21 LAB — CBC
MCH: 30.4 pg (ref 26.0–34.0)
MCHC: 31.7 g/dL (ref 30.0–36.0)
MCV: 96 fL (ref 78.0–100.0)
Platelets: 293 10*3/uL (ref 150–400)
RDW: 15.3 % (ref 11.5–15.5)
WBC: 6.2 10*3/uL (ref 4.0–10.5)

## 2012-02-21 LAB — GLUCOSE, CAPILLARY
Glucose-Capillary: 84 mg/dL (ref 70–99)
Glucose-Capillary: 91 mg/dL (ref 70–99)

## 2012-02-21 LAB — MAGNESIUM: Magnesium: 2.2 mg/dL (ref 1.5–2.5)

## 2012-02-21 LAB — POCT I-STAT 3, ART BLOOD GAS (G3+)
Acid-Base Excess: 2 mmol/L (ref 0.0–2.0)
O2 Saturation: 96 %
pO2, Arterial: 87 mmHg (ref 80.0–100.0)

## 2012-02-21 MED ORDER — PRO-STAT SUGAR FREE PO LIQD
30.0000 mL | Freq: Two times a day (BID) | ORAL | Status: DC
Start: 2012-02-21 — End: 2012-02-21
  Filled 2012-02-21: qty 30

## 2012-02-21 MED ORDER — NEPRO/CARBSTEADY PO LIQD
1000.0000 mL | ORAL | Status: DC
  Filled 2012-02-21: qty 1000

## 2012-02-21 MED ORDER — MIDAZOLAM BOLUS VIA INFUSION: 1.0000 mg | INTRAVENOUS | Status: DC | PRN

## 2012-02-21 MED ORDER — HEPARIN SODIUM (PORCINE) 5000 UNIT/ML IJ SOLN: 5000.0000 [IU] | Freq: Three times a day (TID) | INTRAMUSCULAR | Status: DC

## 2012-02-21 MED ORDER — CLONAZEPAM 2 MG PO TABS: 2.0000 mg | ORAL_TABLET | Freq: Three times a day (TID) | ORAL | Status: DC

## 2012-02-21 MED ORDER — DARBEPOETIN ALFA-POLYSORBATE 200 MCG/0.4ML IJ SOLN: 200.0000 ug | INTRAMUSCULAR | Status: DC

## 2012-02-21 MED ORDER — SODIUM CHLORIDE 0.9 % IV SOLN: 500.0000 mg | INTRAVENOUS | Status: DC

## 2012-02-21 MED ORDER — CHLORHEXIDINE GLUCONATE 0.12 % MT SOLN: 15.0000 mL | Freq: Two times a day (BID) | OROMUCOSAL | Status: AC

## 2012-02-21 MED ORDER — LABETALOL HCL 5 MG/ML IV SOLN: 10.0000 mg | INTRAVENOUS | Status: DC | PRN

## 2012-02-21 MED ORDER — FENTANYL BOLUS VIA INFUSION: 50.0000 ug | Freq: Four times a day (QID) | INTRAVENOUS | Status: DC | PRN

## 2012-02-21 MED ORDER — SODIUM CHLORIDE 0.9 % IV SOLN: 250.0000 mL | INTRAVENOUS | Status: DC | PRN

## 2012-02-21 MED ORDER — HEPARIN SODIUM (PORCINE) 1000 UNIT/ML DIALYSIS: 2000.0000 [IU] | INTRAMUSCULAR | Status: DC | PRN

## 2012-02-21 MED ORDER — NEPRO/CARBSTEADY PO LIQD: 1000.0000 mL | ORAL | Status: DC

## 2012-02-21 MED ORDER — CARVEDILOL 6.25 MG PO TABS: 6.2500 mg | ORAL_TABLET | Freq: Two times a day (BID) | ORAL | Status: DC

## 2012-02-21 MED ORDER — SODIUM CHLORIDE 0.9 % IV SOLN: 50.0000 ug/h | INTRAVENOUS | Status: DC

## 2012-02-21 MED ORDER — BIOTENE DRY MOUTH MT LIQD: 15.0000 mL | Freq: Four times a day (QID) | OROMUCOSAL | Status: DC

## 2012-02-21 MED ORDER — FAMOTIDINE 40 MG/5ML PO SUSR: 20.0000 mg | Freq: Every day | ORAL | Status: DC

## 2012-02-21 MED ORDER — ALBUTEROL SULFATE HFA 108 (90 BASE) MCG/ACT IN AERS: 6.0000 | INHALATION_SPRAY | Freq: Four times a day (QID) | RESPIRATORY_TRACT | Status: DC

## 2012-02-21 MED ORDER — SODIUM CHLORIDE 0.9 % IV SOLN: 2.0000 mg/h | INTRAVENOUS | Status: DC

## 2012-02-21 MED ORDER — FENTANYL 50 MCG/HR TD PT72: 1.0000 | MEDICATED_PATCH | TRANSDERMAL | Status: AC

## 2012-02-21 MED ORDER — PRO-STAT SUGAR FREE PO LIQD: 30.0000 mL | Freq: Two times a day (BID) | ORAL | Status: DC

## 2012-02-21 NOTE — Progress Notes (Signed)
Nutrition Follow-up  Intervention:   1. Adjust Nepro goal rate to 40 ml/hr to better meet nutrition needs  2. Decrease Pro-stat to BID  This will provide 1928 kcal, 108 gm protein, and 698 ml free water.  3. Recommend PEG placement as medically able  4. RD will continue to follow    Assessment:   Initially family decided against trach and PEG, then after improvement in lung function decided to go ahead with trach. Pt had trach placed last night. No PEG placed, unsure if family will want this or continue with NG tube. NG tube is not for long term use, if pt remains on vent support with trach recommend placing PEG. Continues with HD.  RN reports some residuals last night and TF was held for a few hours, restarted today and has not had any more residuals.  RNCM reports pt will likely d/c to [facility] once medically cleared.   Diet Order:  NPO TF: Nepro @ 35 ml/hr  Supplements: Pro-stat TID  Provides 1812 kcal (101%), 113 gm protein (100%) and 610 ml free water. Propofol: none Residuals: none documented  Meds: Scheduled Meds:   . albuterol  6 puff Inhalation Q6H  . antiseptic oral rinse  15 mL Mouth Rinse QID  . carvedilol  6.25 mg Per Tube BID WC  . chlorhexidine  15 mL Mouth Rinse BID  . clonazePAM  2 mg Per Tube TID  . darbepoetin (ARANESP) injection - DIALYSIS  200 mcg Intravenous Q Sat-HD  . etomidate  40 mg Intravenous Once  . famotidine  20 mg Per Tube Daily  . feeding supplement (NEPRO CARB STEADY)  1,000 mL Per Tube Q24H  . feeding supplement  30 mL Oral TID WC  . fentaNYL  100 mcg Transdermal Q72H  . heparin subcutaneous  5,000 Units Subcutaneous Q8H  . meropenem (MERREM) IV  500 mg Intravenous Q24H  . mupirocin cream  1 application Topical BID  . potassium chloride      . propofol  50 mL Intravenous Once  . vecuronium  10 mg Intravenous Once   Continuous Infusions:   . sodium chloride 10 mL/hr (02/18/12 0529)  . fentaNYL infusion INTRAVENOUS 250 mcg/hr (02/21/12 0143)    . midazolam (VERSED) infusion 5 mg/hr (02/21/12 0440)   PRN Meds:.sodium chloride, acetaminophen, albuterol, fentaNYL, heparin, labetalol, lidocaine, lidocaine-prilocaine, midazolam, pentafluoroprop-tetrafluoroeth  Labs:  CMP     Component Value Date/Time   NA 136 02/21/2012 0522   K 3.7 02/21/2012 0522   CL 99 02/21/2012 0522   CO2 25 02/21/2012 0522   GLUCOSE 106* 02/21/2012 0522   BUN 27* 02/21/2012 0522   CREATININE 3.96* 02/21/2012 0522   CREATININE 8.17* 10/15/2011 1709   CALCIUM 10.0 02/21/2012 0522   CALCIUM 7.6* 04/04/2011 0315   PROT 7.8 02/15/2012 0430   ALBUMIN 1.8* 02/15/2012 1450   AST 23 02/15/2012 0430   ALT 12 02/15/2012 0430   ALKPHOS 107 02/15/2012 0430   BILITOT 0.3 02/15/2012 0430   GFRNONAA 15* 02/21/2012 0522   GFRAA 18* 02/21/2012 0522     Intake/Output Summary (Last 24 hours) at 02/21/12 1051 Last data filed at 02/21/12 1000  Gross per 24 hour  Intake 936.72 ml  Output  -3200 ml  Net 4136.72 ml    Weight Status:  66.5 kg (146 lbs), per Nephrology new dry weight likely 65 kg (143 lbs)   Tmax:37.6 C MVe: 14.4  Re-estimated needs:  1948 kcal, 100-120 gm protein  Nutrition Dx:  Inadequate oral intake,  ongoing   Goal:  EN tolerance, met New Goal: EN to provide >90% of estimated nutrition need  Monitor:  Weight, labs, vent status, PEG placement    Clarene Duke RD, LDN Pager [phone] After Hours pager [phone]

## 2012-02-21 NOTE — Progress Notes (Signed)
Name: [name] MRN: [id number] DOB: 01/08/1956    LOS: 26 Date of admit 01/26/2012  5:09 PM    PULMONARY / CRITICAL CARE MEDICINE  56 year old male with PMH of HTN, COPD, Hep C, polysubstance abuse, chronic recurrent pancreatitis, PUD and ESRD. Had PEA arrest on 6/24. Intubated, got emergent HD for hyperkalemia, also treated for shock (sepsis and acidosis). Found to have pseudomonas PNA & bacteremia, also strep agalactiae in blood and urine. Treated w/ antibiotics.Developed C diff on 6/26.  Respiratory status marginal. Required re-intubation on 7/6, self extubated 7/10.  Transferred to SDU 7/11 still had left sided atx. 7/12, back to ICU for worsening collapse on left and acute respiratory failure.   KEY SOCIAL HX Patient's sister, Keval Nam [phone]). Patient has brothers as well. Ms. Tully  Most involved and admits to patient problems with non compliance and substance abuse  EVENTS 6/25 - increasing pressors, fluid bolus for low bp, hypoglycemic requiring D10 6/26 - agitation, some bleeding from upper airway, diarrhea overnight--> flexiseal 6/28 - Severe agitation requiring increased fent gtt overnight, Transfused 1 u on HD            CT abd  - fracture rt 6-8 ribs, splenomegaly, LLL consolidation 6/28 - precedex/ klonopin added with decreased fent gtt 7/01 - Not weanable due to agitation on WUA 7/2 - improved, extubated 7/3 - Tx to IM TS 7/6 - Hypertensive, tachycardic, respiratory distress, intubated 7/7- bronch for collapse 7/8- did not recollapse 7/9- self extubated, marginal 7/10- improved resp status, NIMV 7/12: acute on chronic resp failure due to recurrent plugging and collapse. Moved back to the ICU   SUBJECTIVE/OVERNIGHT/INTERVAL HX S/p trach, day prior  Vital Signs: Temp:  [98.8 F (37.1 C)-99.6 F (37.6 C)] 98.8 F (37.1 C) (07/19 0800) Pulse Rate:  [81-107] 88  (07/19 1200) Resp:  [16-22] 18  (07/19 1200) BP: (95-177)/(58-101) 113/61 mmHg (07/19  1200) SpO2:  [97 %-100 %] 98 % (07/19 1200) FiO2 (%):  [40 %-50 %] 40 % (07/19 1200) Weight:  [66.5 kg (146 lb 9.7 oz)] 66.5 kg (146 lb 9.7 oz) (07/19 0600)   Physical Examination: Gen: intubated, sedated HEENT: NCAT PULM: ronchi CV; RRR, systolic murmur Ab: BS+, soft, nontender Ext: warm, no edema Neuro: follows commands, moves all ext  ASSESSMENT AND PLAN:  Patient Active Problem List  Diagnosis  . SUBSTANCE ABUSE, MULTIPLE  . PEPTIC ULCER DISEASE  . Chronic recurrent pancreatitis  . End stage renal disease on dialysis  . Alcohol abuse  . COPD (chronic obstructive pulmonary disease)  . Neuropathy  . Protein malnutrition  . PEA (Pulseless electrical activity)  . Acute respiratory failure with hypoxia  . Hyperkalemia  . Bacteremia due to Pseudomonas  . Sepsis due to Streptococcus, group B  . C. difficile colitis  . Anemia  . Thrombocytopenia   PULMONARY  Lab 02/21/12 0423 02/19/12 0404 02/16/12 0412 02/15/12 0430 02/14/12 1656  PHART 7.378 7.360 7.427 7.430 7.446  PCO2ART 46.4* 51.6* 46.5* 42.3 41.3  PO2ART 87.0 68.9* 62.6* 68.0* 136.0*  HCO3 27.4* 28.4* 30.1* 27.9* 28.5*  O2SAT 96.0 91.5 93.6 93.0 99.0   CXR:  7/19 >> increased bibasilar hazziness ETT:   7/1>>>7/3  7/6>>>7/10 self extub  7/12 >> 7/18  7/18 trach>>  A:  Acute On Chronic Respiratory due to LLL Aspiration pna d/t pseudomonas, complicated by underlying COPD & Fracture rt 6-8 ribs with cpr.  P:   -pcxr reviewed, for PS weaning PS 12 attempt -for hd  this afternoon -would not TC as of now -consider pcxr follow up   CARDIOVASCULAR No results found for this basename: TROPONINI:5,LATICACIDVEN:5, O2SATVEN:5,PROBNP:5 in the last 168 hours ECG:  Peak T waves, Sinus rhythm at admit on 6/23 -> normalized 6/24 Lines: Left IJ CVL 6/24 >> 7/3              A: History of hypertension -BP much improved.  P:  -Tele. -Cont home meds. -HD per renal, intermittent HD.  A: A Fib P:  -Completed amiodarone  load; stopped amio and follow for recurrence before deciding whether he needs long term PO amio (suspect this event due to stress of acute illness).  RENAL  Lab 02/21/12 0522 02/20/12 0325 02/19/12 0500 02/18/12 0530 02/17/12 0345 02/16/12 0237 02/15/12 1450 02/15/12 0430  NA 136 139 136 137 135 -- -- --  K 3.7 3.2* -- -- -- -- -- --  CL 99 96 96 99 98 -- -- --  CO2 25 30 27 25 26  -- -- --  BUN 27* 34* 21 42* 27* -- -- --  CREATININE 3.96* 5.48* 3.87* 6.39* 4.72* -- -- --  CALCIUM 10.0 9.8 9.3 9.4 9.2 -- -- --  MG 2.2 2.4 2.1 -- -- 1.9 -- 2.0  PHOS 4.4 7.2* 5.5* -- -- 4.1 5.9* --   Intake/Output      07/18 0701 - 07/19 0700 07/19 0701 - 07/20 0700   I.V. (mL/kg) 579.7 (8.7) 70 (1.1)   Other 50    NG/GT 295 200   Total Intake(mL/kg) 924.7 (13.9) 270 (4.1)   Other -3300    Stool  100   Total Output -3300 100   Net +4224.7 +170         Foley:  6/23 >> out A:   ESRD on hemodialysis   P:   -HD per Nephrology, dialysis on 7/18. -HD [facility] or in [facility] today -either way, Pt would NOT NEED HD OVERWEEKEND with or without HD today, k 3.7  GASTROINTESTINAL  Lab 02/15/12 1450 02/15/12 0430  AST -- 23  ALT -- 12  ALKPHOS -- 107  BILITOT -- 0.3  PROT -- 7.8  ALBUMIN 1.8* 1.8*   A:  History of PUD, CDIFF Colitis with pos C Diff PCR  P:   -Oral vanco completed.  HEMATOLOGIC  Lab 02/21/12 0522 02/20/12 1130 02/20/12 0325 02/19/12 0500 02/18/12 0530 02/17/12 0345  HGB 7.7* -- 7.7* 8.4* 7.3* 7.4*  HCT 24.3* -- 24.3* 26.4* 23.3* 23.5*  PLT 293 -- 328 353 352 363  INR -- 1.02 -- -- -- --  APTT -- 39* -- -- -- --   A:  Anemic of critical illness / Thrombocytopenia  Stable, PLT improving P:  Sub q hep tolerated  INFECTIOUS  Lab 02/21/12 0522 02/20/12 0325 02/19/12 0500 02/18/12 0530 02/17/12 0345 02/15/12 0430  WBC 6.2 3.6* 4.1 5.5 6.0 --  PROCALCITON -- -- -- -- -- 1.39   Cultures:  6/23 urine >> strep agalactiae 6/23 resp >>strep  agalactiae 6/23 bld >> pseudomonas, strep agalactiae 6/26 C diff pCR POS 6/27 blood X 2  >> neg  7/6 Sputum >>>rare candida 7/7 BAL bronch>>>few speudomonas, pansens 7/12 BAL>>> 2000cfu Pseudomonas  ABx   Pip/ Tazo 6/23 >> 6/27 cipro 6/23 >>6/27 vanc 6/23 >> 6/27 Cefepime 6/27 (pseudomonas, Strep agalactiae)>>7/9 clinical failure? Meropenem 7/9>>>  PO vanc 6/27 (CDIFF)>> 7/15 Flagyl 6/27 (CDIFF)>>7/3 Vanc (HCAP) >>off  A:   Pseudomonas sepsis, d/t LLL  PNA d/t pseudomonas History of Hep C Group B Strep Agalactiae bacteremia C. Diff colitis Mucus plug, likely HCAP on 7/6 P:  -stopped oral vanco 7/15, has had 20 days -For now cont merrem, had clinical failure to cefipime Need to establish stop date. If recollapse consider neb Tx abx  ENDOCRINE  Lab 02/21/12 1212 02/21/12 0802 02/21/12 0439 02/21/12 0011 02/20/12 2040  GLUCAP 86 92 91 84 91   A:   Hypoglycemia - resolved  P:   -Check CBG's Q6. -Continue TF.  NEUROLOGIC  A:  Encephalopathy - agitated delirium, h/o polysubstance abuse. Now more arousable but not enough to make any decisions. P:   -Increased Klonopin to 2 mg TID -Continuous sedation drips WUA and avoid when able -Fentanyl patch at 100 mcg..  CC time of 30 min.  Mcarthur Rossetti. Tyson Alias, MD, FACP Pgr: [phone] [facility]

## 2012-02-21 NOTE — Progress Notes (Signed)
[facility] called and said they would prefer to have him transferred over this afternoon and do dialysis at [facility], so I am cancelling dialysis here at [facility] today.  I have spoken to Dr. Tyson Alias that patient will be OK until Monday whether or not he gets the extra dialysis today. He had a full dialysis yesterday on Thursday.    [name] Moselle  MD [facility] [phone] pgr    [phone] cell 02/21/2012, 1:45 PM

## 2012-02-21 NOTE — Progress Notes (Signed)
Subjective: On vent, s/p trach yest.   Objective Vital signs in last 24 hours: Filed Vitals:   02/21/12 0917 02/21/12 1000 02/21/12 1144 02/21/12 1200  BP: 135/79 135/80  113/61  Pulse:  88 91 88  Temp:      TempSrc:      Resp:   20 18  Height:      Weight:      SpO2:   100% 98%   Weight change: -3 kg (-6 lb 9.8 oz)  Intake/Output Summary (Last 24 hours) at 02/21/12 1250 Last data filed at 02/21/12 1100  Gross per 24 hour  Intake 899.72 ml  Output    100 ml  Net 799.72 ml   Labs: Basic Metabolic Panel:  Lab 02/21/12 5621 02/20/12 0325 02/19/12 0500 02/18/12 0530 02/17/12 0345 02/16/12 0237 02/15/12 1450 02/15/12 0430  NA 136 139 136 137 135 134* 134* --  K 3.7 3.2* 3.4* 4.0 3.6 3.3* 4.1 --  CL 99 96 96 99 98 97 99 --  CO2 25 30 27 25 26 29 25  --  GLUCOSE 106* 104* 87 96 103* 118* 122* --  BUN 27* 34* 21 42* 27* 12 30* --  CREATININE 3.96* 5.48* 3.87* 6.39* 4.72* 2.92* 5.76* --  ALB -- -- -- -- -- -- -- --  CALCIUM 10.0 9.8 9.3 9.4 9.2 9.1 9.2 --  PHOS 4.4 7.2* 5.5* -- -- 4.1 5.9* 5.5*   Liver Function Tests:  Lab 02/15/12 1450 02/15/12 0430  AST -- 23  ALT -- 12  ALKPHOS -- 107  BILITOT -- 0.3  PROT -- 7.8  ALBUMIN 1.8* 1.8*   No results found for this basename: LIPASE:3,AMYLASE:3 in the last 168 hours No results found for this basename: AMMONIA:3 in the last 168 hours CBC:  Lab 02/21/12 0522 02/20/12 0325 02/19/12 0500 02/18/12 0530  WBC 6.2 3.6* 4.1 5.5  NEUTROABS -- -- -- --  HGB 7.7* 7.7* 8.4* 7.3*  HCT 24.3* 24.3* 26.4* 23.3*  MCV 96.0 95.3 96.0 96.7  PLT 293 328 353 352   PT/INR: @labrcntip (inr:5) Cardiac Enzymes: No results found for this basename: CKTOTAL:5,CKMB:5,CKMBINDEX:5,TROPONINI:5 in the last 168 hours CBG:  Lab 02/21/12 1212 02/21/12 0802 02/21/12 0439 02/21/12 0011 02/20/12 2040  GLUCAP 86 92 91 84 91    Iron Studies: No results found for this basename: IRON:30,TIBC:30,TRANSFERRIN:30,FERRITIN:30 in the last 168 hours  Physical  Exam:  Blood pressure 113/61, pulse 88, temperature 98.8 F (37.1 C), temperature source Oral, resp. rate 18, height 6' (1.829 m), weight 66.5 kg (146 lb 9.7 oz), SpO2 98.00%.  Gen: Intubated, awake, does not follow commands or respond appropriately HYQ:MVHQI RRR, normal S1 and S2  Resp:Coarse BS bilaterally ONG:EXBM, flat, NT, BS normal  Ext:No LE edema, LUA access +thrill   Assessment/Plan 1. VDRF- PNA, FTT. Per primary, prob d/c to [facility] today. 2. ESRD TTS/[facility]- HD today to get on MWFschedule for [facility] transfer 3. Cdiff- completed 20d course of PO Vancomycin 4. PNA- Meropenem for HCAP, +pseudomonas from cultures 5. Alcohol abuse- chronic and relapsed multiple times after attempts at rehab/abstinence. On scheduled clonazepam tid for management of withdrawal 6. Anemia:/ACDz and ABLA= hgb 7's, on aranesp with extra dose given 7/13. 7. s/p PEA Arrest 8. HTN- BP up on Carvedilol 6.25 BID,  off clonidine/lisinopril. 9. 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 Phos 5-7 range, Ca 9-10. 10. Limited cod- spoke w family, they are OK with feeding tube, NHP/LTAC, but do not want resuscitation attempt in event of  cardiac arrest= limited code. See orders.    Vinson Moselle  MD [facility] [phone] pgr    [phone] cell 02/21/2012, 12:50 PM

## 2012-02-21 NOTE — Discharge Summary (Signed)
Physician Discharge Summary  Patient ID: [name] MRN: [id number] DOB/AGE: [birth year] 57 y.o.  Admit date: 01/26/2012 Discharge date: 02/21/2012    Discharge Diagnoses:  Principal Problem:  *PEA (Pulseless electrical activity) Active Problems:  End stage renal disease on dialysis  Alcohol abuse  Acute respiratory failure with hypoxia  Hyperkalemia  Bacteremia due to Pseudomonas  Sepsis due to Streptococcus, group B  C. difficile colitis  Anemia  Thrombocytopenia    Brief Summary: [name] is a 56 y.o. y/o male with a PMH of HTN, COPD, Hep C, polysubstance abuse, chronic recurrent pancreatitis, PUD and ESRD. Had PEA arrest on 6/24. Intubated, got emergent HD for hyperkalemia, also treated for shock (sepsis and acidosis). Found to have pseudomonas PNA & bacteremia, also strep agalactiae in blood and urine. Treated w/ antibiotics.  Developed C diff on 6/26. Respiratory status marginal. Required re-intubation on 7/6, self extubated 7/10. Transferred to SDU 7/11 still had left sided atx. 7/12, back to ICU for worsening collapse on left and acute respiratory failure. 7/18 underwent percutaneous tracheostomy secondary to prolonged respiratory failure.                                                                        Hospital Summary by Discharge Diagnosis   PEA (Pulseless electrical activity) [name] initially presented to [facility] on 6/23 complaining of chest pain from home. On EMS arrival they found him in PEA arrest requiring ACLS with return of spontaneous circulation after 6 minutes of CPR. It was noted that he admits dialysis for one week prior to admission.  He required intubation and mechanical ventilation. After evaluation he was placed on hypothermia protocol. Was felt that PEA arrest was most likely related to metabolic derangements secondary to noncompliance with dialysis. He unfortunately had fractured ribs 6 through 8 as a result of CPR.     Sepsis due  to Streptococcus, group B / Bacteremia due to Pseudomonas /  C. difficile colitis Sepsis secondary to group B strep and Pseudomonas bacteremia. Also noted during admission to have C. difficile colitis. He has completed oral vancomycin for C. difficile colitis.  The respiratory section for further discussion regarding meropenem.    Acute respiratory failure with hypoxia / Hx of COPD Acute on chronic respiratory failure due to left lower lobe aspiration pneumonia with positive sputum culture for Pseudomonas. Complicated by underlying COPD and fracture of right ribs 6 - 8 secondary to CPR. He required prolonged mechanical ventilation with multiple episodes of intubation/extubation.  He ultimately required placement of percutaneous tracheostomy on 7/18 due to prolonged respiratory failure.  Respiratory cultures were noted to be positive for strep agalctiae and pansensitive Pseudomonas.  His blood cultures were also positive for both.  He had repeated episodes of left lung collapse/mucus plugging requiring bronchoscopy.  Currently he is on meropenem and will continue at time of discharge  -For now cont merrem, had clinical failure to cefipime  -Need to establish stop date. If recollapse consider neb Tx abx -Continue fentanyl and Versed drips with weaning to off as tolerated.  End stage renal disease on dialysis / Hyperkalemia Known history of end-stage renal disease with medical noncompliance. He initially was admitted hyperkalemia in the setting of known end-stage  renal disease.  He had missed one week's worth of dialysis prior presentation. During hospitalization nephrology was consulted and he was maintained on hemodialysis.  -Hemodialysis deferred 7/19 at [facility].  If able he can have hemodialysis at [facility].   -Patient would NOT NEED HD OVERWEEKEND with or without HD today, k 3.7  Alcohol abuse / Malnutrition Mr. also has a known history of extensive alcohol abuse and medical  noncompliance. He has noted protein calorie malnutrition and is currently nutritionally supported via tube feeding. Recommended to continue tube feeding with swallowing evaluation as patient makes progress from a respiratory standpoint.  Anemia / Thrombocytopenia Anemia in the setting of critical illness.  Patient has been on subcutaneous heparin during admission and has tolerated with stable platelets.     Hypertension / A. fib Known history of hypertension. Paroxysmal atrial fibrillation noted during hospitalization--Completed amiodarone load; stopped amio and follow for recurrence before deciding whether he needs long term PO amio (suspect this event due to stress of acute illness). Continue scheduled Coreg and PRN labetalol for blood pressure control.  Encephalopathy - agitated delirium, h/o polysubstance abuse. Now more arousable but not enough to make any decisions. Question what baseline mental status is actually??   -Continue Klonopin to 2 mg TID  -Continuous sedation drips with daily wake up assessment. Is recommended to reduce these minimum dosages as able. -Fentanyl patch at 100 mcg.   Consults: Nephrology  Lines/tubes: ETT: 7/1>>>7/3  7/6>>>7/10 self extub  7/12 >> 7/18  7/18 trach>>  7/18 LIJ changed over wire>>>  Microbiology/Sepsis markers: 6/23 urine >> strep agalactiae  6/23 resp >>strep agalactiae  6/23 bld >> pseudomonas, strep agalactiae  6/26 C diff pCR POS  6/27 blood X 2 >> neg  7/6 Sputum >>>rare candida  7/7 BAL bronch>>>few speudomonas, pansens  7/12 BAL>>> 2000cfu Pseudomonas  ABX  Pip/ Tazo 6/23 >> 6/27  cipro 6/23 >>6/27  vanc 6/23 >> 6/27  Cefepime 6/27 (pseudomonas, Strep agalactiae)>>7/9 clinical failure?  Meropenem 7/9>>>  PO vanc 6/27 (CDIFF)>> 7/15  Flagyl 6/27 (CDIFF)>>7/3  Vanc (HCAP) >>off   Significant Diagnostic Studies:  6/24 2-D echo>>>Left ventricle: The cavity size was normal. Wall thickness was normal. Systolic function was  normal. The estimated ejection fraction was in the range of 50% to 55%. Wall motion was normal; there were no regional wall motion abnormalities. Doppler parameters are consistent with abnormal left ventricular relaxation (grade 1 diastolic dysfunction). - Mitral valve: Calcified annulus. Mildly thickened leaflets . Mild regurgitation. - Pulmonary arteries: Systolic pressure was mildly increased. PA peak pressure: 38mm Hg (S).  6/28 CT ABD/Pelvis>>>No acute findings in the abdomen or pelvis.  Extensive air space consolidation throughout the visualized lung bases, particularly in the left lower lobe where there is near complete consolidation of the visualized lung. This presumably represents severe multilobar pneumonia. In addition, there are small bilateral pleural effusions.  There is splenomegaly which has increased compared to prior examination.  Bilateral renal atrophy.  Acute nondisplaced fractures of the anterolateral aspect of the right 6th-8th ribs.   EVENTS  6/25 - increasing pressors, fluid bolus for low bp, hypoglycemic requiring D10  6/26 - agitation, some bleeding from upper airway, diarrhea overnight--> flexiseal  6/28 - Severe agitation requiring increased fent gtt overnight, Transfused 1 u on HD  CT abd - fracture rt 6-8 ribs, splenomegaly, LLL consolidation  6/28 - precedex/ klonopin added with decreased fent gtt  7/01 - Not weanable due to agitation on WUA  7/2 - improved,  extubated  7/3 - Tx to IM TS  7/6 - Hypertensive, tachycardic, respiratory distress, intubated  7/7- bronch for collapse  7/8 - did not recollapse  7/9 - self extubated, marginal  7/10- improved resp status, NIMV  7/12 - acute on chronic resp failure due to recurrent plugging and collapse. Moved back to the ICU  7/18 - Perc trach [name])  Discharge Exam: Gen: intubated, sedated  HEENT: NCAT  PULM: ronchi  CV; RRR, systolic murmur  Ab: BS+, soft, nontender  Ext: warm, no edema  Neuro: follows  commands, moves all ext  Filed Vitals:   02/21/12 1144 02/21/12 1200 02/21/12 1300 02/21/12 1523  BP:  113/61 143/92   Pulse: 91 88 92 88  Temp:      TempSrc:      Resp: 20 18 18 18   Height:      Weight:      SpO2: 100% 98% 99% 100%     Discharge Labs  BMET  Lab 02/21/12 0522 02/20/12 0325 02/19/12 0500 02/18/12 0530 02/17/12 0345 02/16/12 0237 02/15/12 1450 02/15/12 0430  NA 136 139 136 137 135 -- -- --  K 3.7 3.2* -- -- -- -- -- --  CL 99 96 96 99 98 -- -- --  CO2 25 30 27 25 26  -- -- --  GLUCOSE 106* 104* 87 96 103* -- -- --  BUN 27* 34* 21 42* 27* -- -- --  CREATININE 3.96* 5.48* 3.87* 6.39* 4.72* -- -- --  CALCIUM 10.0 9.8 9.3 9.4 9.2 -- -- --  MG 2.2 2.4 2.1 -- -- 1.9 -- 2.0  PHOS 4.4 7.2* 5.5* -- -- 4.1 5.9* --     CBC  Lab 02/21/12 0522 02/20/12 0325 02/19/12 0500  HGB 7.7* 7.7* 8.4*  HCT 24.3* 24.3* 26.4*  WBC 6.2 3.6* 4.1  PLT 293 328 353   Anti-Coagulation  Lab 02/20/12 1130  INR 1.02      Discharge Orders    Future Orders Please Complete By Expires   Increase activity slowly            Medication List  As of 02/21/2012  4:14 PM   START taking these medications         antiseptic oral rinse Liqd   15 mLs by Mouth Rinse route QID.      carvedilol 6.25 MG tablet   Commonly known as: COREG   Place 1 tablet (6.25 mg total) into feeding tube 2 (two) times daily with a meal.      chlorhexidine 0.12 % solution   Commonly known as: PERIDEX   Use as directed 15 mLs in the mouth or throat 2 (two) times daily.      darbepoetin 200 MCG/0.4ML Soln   Commonly known as: ARANESP   Inject 0.4 mLs (200 mcg total) into the vein every Saturday with hemodialysis.      famotidine 40 MG/5ML suspension   Commonly known as: PEPCID   Place 2.5 mLs (20 mg total) into feeding tube daily.      feeding supplement (NEPRO CARB STEADY) Liqd   Place 1,000 mLs into feeding tube daily.      feeding supplement Liqd   Take 30 mLs by mouth 2 times daily at 12  noon and 4 pm.      fentaNYL 50 MCG/HR   Commonly known as: DURAGESIC - dosed mcg/hr   Place 1 patch (50 mcg total) onto the skin every 3 (three) days.  fentaNYL Soln   Commonly known as: SUBLIMAZE   Inject 50-100 mcg into the vein every 6 (six) hours as needed (to achieve analgesia).      heparin 5000 UNIT/ML injection   Inject 1 mL (5,000 Units total) into the skin every 8 (eight) hours.      labetalol 5 MG/ML injection   Commonly known as: NORMODYNE,TRANDATE   Inject 2 mLs (10 mg total) into the vein every 2 (two) hours as needed (for BP >180/110).      midazolam 1 mg/mL Soln   Commonly known as: VERSED   Inject 1-2 mg into the vein every 2 (two) hours as needed.      sodium chloride 0.9 % infusion   Inject 250 mLs into the vein as needed (if IV carrier fluid needed.).      sodium chloride 0.9 % SOLN 200 mL with fentaNYL 0.05 MG/ML SOLN 2,500 mcg   Inject 50-400 mcg/hr into the vein continuous.      sodium chloride 0.9 % SOLN 40 mL with midazolam 5 MG/ML SOLN 50 mg   Inject 2-10 mg/hr into the vein continuous.      sodium chloride 0.9 % SOLN 50 mL with meropenem 500 MG SOLR 500 mg   Inject 500 mg into the vein daily.         CHANGE how you take these medications         albuterol 108 (90 BASE) MCG/ACT inhaler   Commonly known as: PROVENTIL HFA;VENTOLIN HFA   Inhale 6 puffs into the lungs every 6 (six) hours.   What changed: - dose - how often to take the med - reasons to take the med - doctor's instructions      clonazePAM 2 MG tablet   Commonly known as: KLONOPIN   Place 1 tablet (2 mg total) into feeding tube 3 (three) times daily.   What changed: - medication strength - dose - route (how to take the med) - how often to take the med - reasons to take the med - doctor's instructions         STOP taking these medications         calcium acetate 667 MG capsule      cloNIDine 0.3 MG tablet      diazepam 5 MG tablet      fosinopril 10 MG tablet        gabapentin 300 MG capsule      latanoprost 0.005 % ophthalmic solution      lidocaine-prilocaine cream      MOVIPREP 100 G Solr      oxyCODONE-acetaminophen 5-325 MG per tablet      pregabalin 75 MG capsule      tiotropium 18 MCG inhalation capsule      traMADol 50 MG tablet      zolpidem 5 MG tablet          Where to get your medications       Information on where to get these meds is not yet available. Ask your nurse or doctor.         albuterol 108 (90 BASE) MCG/ACT inhaler   antiseptic oral rinse Liqd   carvedilol 6.25 MG tablet   chlorhexidine 0.12 % solution   clonazePAM 2 MG tablet   darbepoetin 200 MCG/0.4ML Soln   famotidine 40 MG/5ML suspension   feeding supplement (NEPRO CARB STEADY) Liqd   feeding supplement Liqd   fentaNYL 50 MCG/HR   fentaNYL Soln  heparin 5000 UNIT/ML injection   labetalol 5 MG/ML injection   midazolam 1 mg/mL Soln   sodium chloride 0.9 % infusion   sodium chloride 0.9 % SOLN 200 mL with fentaNYL 0.05 MG/ML SOLN 2,500 mcg   sodium chloride 0.9 % SOLN 40 mL with midazolam 5 MG/ML SOLN 50 mg   sodium chloride 0.9 % SOLN 50 mL with meropenem 500 MG SOLR 500 mg            SOCIAL HX  Patient's sister, Keshaun Dubey [phone]). Patient has brothers as well. Ms. Hinchman Most involved and admits to patient problems with non compliance and substance abuse.  Disposition: [facility] Not Defined.  To select specialty Hospital.  Discharged Condition: [name] has met maximum benefit of inpatient care and is medically stable and cleared for discharge.  Patient is pending follow up as above.      Time spent on disposition:  Greater than 35 minutes.   Signed: [name], NP-C  Pulmonary & Critical Care Pgr: [phone]

## 2012-02-22 ENCOUNTER — Other Ambulatory Visit (HOSPITAL_COMMUNITY): Payer: Self-pay

## 2012-02-22 DIAGNOSIS — A0472 Enterocolitis due to Clostridium difficile, not specified as recurrent: Secondary | ICD-10-CM

## 2012-02-22 DIAGNOSIS — J9600 Acute respiratory failure, unspecified whether with hypoxia or hypercapnia: Secondary | ICD-10-CM

## 2012-02-22 DIAGNOSIS — R7881 Bacteremia: Secondary | ICD-10-CM

## 2012-02-22 DIAGNOSIS — B965 Pseudomonas (aeruginosa) (mallei) (pseudomallei) as the cause of diseases classified elsewhere: Secondary | ICD-10-CM

## 2012-02-22 LAB — BLOOD GAS, ARTERIAL
Acid-base deficit: 0.2 mmol/L (ref 0.0–2.0)
MECHVT: 550 mL
O2 Saturation: 95.5 %
PEEP: 5 cmH2O
Patient temperature: 99.9
RATE: 20 resp/min
pO2, Arterial: 81.2 mmHg (ref 80.0–100.0)

## 2012-02-22 LAB — RENAL FUNCTION PANEL
Albumin: 2.1 g/dL — ABNORMAL LOW (ref 3.5–5.2)
BUN: 46 mg/dL — ABNORMAL HIGH (ref 6–23)
CO2: 23 mEq/L (ref 19–32)
Calcium: 10.3 mg/dL (ref 8.4–10.5)
Chloride: 97 mEq/L (ref 96–112)
Creatinine, Ser: 5.76 mg/dL — ABNORMAL HIGH (ref 0.50–1.35)
GFR calc Af Amer: 11 mL/min — ABNORMAL LOW (ref >90)
GFR calc non Af Amer: 10 mL/min — ABNORMAL LOW (ref >90)
Glucose, Bld: 105 mg/dL — ABNORMAL HIGH (ref 70–99)
Phosphorus: 4.6 mg/dL (ref 2.3–4.6)
Potassium: 3.6 mEq/L (ref 3.5–5.1)
Sodium: 135 mEq/L (ref 135–145)

## 2012-02-22 LAB — CBC WITH DIFFERENTIAL/PLATELET
Basophils Absolute: 0 10*3/uL (ref 0.0–0.1)
Eosinophils Absolute: 0.1 10*3/uL (ref 0.0–0.7)
Eosinophils Relative: 2 % (ref 0–5)
HCT: 23.8 % — ABNORMAL LOW (ref 39.0–52.0)
Hemoglobin: 7.6 g/dL — ABNORMAL LOW (ref 13.0–17.0)
MCH: 30.5 pg (ref 26.0–34.0)
MCHC: 31.9 g/dL (ref 30.0–36.0)
MCV: 95.6 fL (ref 78.0–100.0)
Platelets: 306 10*3/uL (ref 150–400)
RBC: 2.49 MIL/uL — ABNORMAL LOW (ref 4.22–5.81)
RDW: 15.3 % (ref 11.5–15.5)
WBC: 5.9 10*3/uL (ref 4.0–10.5)

## 2012-02-22 LAB — BASIC METABOLIC PANEL
BUN: 46 mg/dL — ABNORMAL HIGH (ref 6–23)
Chloride: 98 mEq/L (ref 96–112)
Creatinine, Ser: 5.76 mg/dL — ABNORMAL HIGH (ref 0.50–1.35)
GFR calc Af Amer: 11 mL/min — ABNORMAL LOW (ref >90)
Glucose, Bld: 104 mg/dL — ABNORMAL HIGH (ref 70–99)

## 2012-02-22 LAB — FERRITIN: Ferritin: 1141 ng/mL — ABNORMAL HIGH (ref 22–322)

## 2012-02-22 LAB — CULTURE, RESPIRATORY W GRAM STAIN

## 2012-02-22 LAB — HEPATITIS B SURFACE ANTIBODY,QUALITATIVE: Hep B S Ab: POSITIVE — AB

## 2012-02-22 LAB — IRON AND TIBC
Iron: 25 ug/dL — ABNORMAL LOW (ref 42–135)
Saturation Ratios: 15 % — ABNORMAL LOW (ref 20–55)
TIBC: 169 ug/dL — ABNORMAL LOW (ref 215–435)
UIBC: 144 ug/dL (ref 125–400)

## 2012-02-22 LAB — FOLATE: Folate: 14.9 ng/mL

## 2012-02-22 LAB — TRANSFERRIN: Transferrin: 154 mg/dL — ABNORMAL LOW (ref 200–360)

## 2012-02-22 LAB — HEPATITIS B SURFACE ANTIGEN: Hepatitis B Surface Ag: NEGATIVE

## 2012-02-22 LAB — RETICULOCYTES: Retic Count, Absolute: 74.7 10*3/uL (ref 19.0–186.0)

## 2012-02-22 NOTE — Consult Note (Signed)
Patient: [name] DOB: 10/01/1955 Date of Admission: 02/21/2012            Pulmonary consult  Date of Consult: 02/22/2012 MD requesting consult:  [facility] Reason for consult:  Vent/trach management  HPI - 56 y.o. y/o male with a PMH of HTN, COPD, Hep C, polysubstance abuse, chronic recurrent pancreatitis, PUD and ESRD. Had PEA arrest on 6/24. Intubated, got emergent HD for hyperkalemia, also treated for shock (sepsis and acidosis). Found to have pseudomonas PNA & bacteremia, also strep agalactiae in blood and urine. Treated w/ antibiotics. Developed C diff on 6/26. Respiratory status marginal. Required re-intubation on 7/6, self extubated 7/10. Transferred to SDU 7/11 still had left sided atx. 7/12, back to ICU for worsening collapse on left and acute respiratory failure. 7/18 underwent percutaneous tracheostomy secondary to prolonged respiratory failure.  Tx [facility]/19 [facility] for [facility] we[facility], HD and abx.  PCCM consulted to [facility] for vent mgmt.  & bleeding around Tstomy site.   Allergies:  Allergies  Allergen Reactions  . Penicillins     "childhood reaction"     PMH: Past Medical History  Diagnosis Date  . Pancytopenia     chronic  . Polysubstance abuse     chronic most notable for alcohol  . Malignant hypertension   . Hepatitis C   . COPD (chronic obstructive pulmonary disease)   . Chronic recurrent pancreatitis     likely secondary to alcoholism  . Smoker   . Alcohol abuse   . Respiratory failure Jan 2012    Hx of VDRF   . Burn   . PUD (peptic ulcer disease)     two small ulcers on 2011 EGD, duodenitis noted on 2012 EGD w/o presence of ulcers  . Hep C w/o coma, chronic   . End-stage renal disease on hemodialysis     HD on MWF, [facility]    Home meds: Medications Prior to Admission  Medication Sig Dispense Refill  . albuterol (PROVENTIL HFA;VENTOLIN HFA) 108 (90 BASE) MCG/ACT inhaler Inhale 6 puffs into the lungs every 6 (six) hours.      Marland Kitchen  antiseptic oral rinse (BIOTENE) LIQD 15 mLs by Mouth Rinse route QID.      Marland Kitchen carvedilol (COREG) 6.25 MG tablet Place 1 tablet (6.25 mg total) into feeding tube 2 (two) times daily with a meal.      . chlorhexidine (PERIDEX) 0.12 % solution Use as directed 15 mLs in the mouth or throat 2 (two) times daily.  120 mL    . clonazePAM (KLONOPIN) 2 MG tablet Place 1 tablet (2 mg total) into feeding tube 3 (three) times daily.  30 tablet    . darbepoetin (ARANESP) 200 MCG/0.4ML SOLN Inject 0.4 mLs (200 mcg total) into the vein every Saturday with hemodialysis.  1.68 mL    . famotidine (PEPCID) 40 MG/5ML suspension Place 2.5 mLs (20 mg total) into feeding tube daily.  50 mL    . feeding supplement (PRO-STAT SUGAR FREE 64) LIQD Take 30 mLs by mouth 2 times daily at 12 noon and 4 pm.  900 mL    . fentaNYL (DURAGESIC - DOSED MCG/HR) 50 MCG/HR Place 1 patch (50 mcg total) onto the skin every 3 (three) days.  5 patch    . fentaNYL (SUBLIMAZE) SOLN Inject 50-100 mcg into the vein every 6 (six) hours as needed (to achieve analgesia).      . heparin 5000 UNIT/ML injection Inject 1 mL (5,000 Units total)  into the skin every 8 (eight) hours.  1 mL    . labetalol (NORMODYNE,TRANDATE) 5 MG/ML injection Inject 2 mLs (10 mg total) into the vein every 2 (two) hours as needed (for BP >180/110).  20 mL    . midazolam (VERSED) 1 mg/mL SOLN Inject 1-2 mg into the vein every 2 (two) hours as needed.      . Nutritional Supplements (FEEDING SUPPLEMENT, NEPRO CARB STEADY,) LIQD Place 1,000 mLs into feeding tube daily.      . sodium chloride 0.9 % infusion Inject 250 mLs into the vein as needed (if IV carrier fluid needed.).      Marland Kitchen sodium chloride 0.9 % SOLN 200 mL with fentaNYL 0.05 MG/ML SOLN 2,500 mcg Inject 50-400 mcg/hr into the vein continuous.      . sodium chloride 0.9 % SOLN 40 mL with midazolam 5 MG/ML SOLN 50 mg Inject 2-10 mg/hr into the vein continuous.      . sodium chloride 0.9 % SOLN 50 mL with meropenem 500 MG SOLR  500 mg Inject 500 mg into the vein daily.         Social Hx: History   Social History  . Marital Status: Divorced    Spouse Name: N/A    Number of Children: 1  . Years of Education: N/A   Occupational History  . Disability     Social History Main Topics  . Smoking status: Former Smoker -- 1.0 packs/day for 40 years    Types: Cigarettes  . Smokeless tobacco: Never Used  . Alcohol Use: 7.0 oz/week    14 drink(s) per week     12/19/2011 resumed drinking 4-24 oz cans wine coolers daily   . Drug Use: No  . Sexually Active: Not on file         Other Topics Concern  . Not on file   Social History Narrative    unemployed divorced , used to drink one bottle of whiskey for years. States he is currently drinking 4 24oz wine coolers per day5/16/2013 lives with ex son-in-law in [place], [place]     Family Hx: Family History  Problem Relation Age of Onset  . Hypertension Mother   . Stroke Mother   . Alcohol abuse    . Anxiety disorder    . Hyperlipidemia    . Stroke    . Colon cancer Neg Hx      ROS: Unable   Vitals:  Reviewed.  Abnornal values addressed in Imp/Plan.   chest X-ray 7/20>>1. Bibasilar airspace opacities with layering pleural effusion onthe right.  2. Borderline cardiomegaly. 3. Cannot exclude right lateral eighth rib fracture.   CBC    Component Value Date/Time   WBC 5.9 02/22/2012 0540   RBC 2.49* 02/22/2012 0540   HGB 7.6* 02/22/2012 0540   HCT 23.8* 02/22/2012 0540   PLT 306 02/22/2012 0540   MCV 95.6 02/22/2012 0540   MCH 30.5 02/22/2012 0540   MCHC 31.9 02/22/2012 0540   RDW 15.3 02/22/2012 0540   LYMPHSABS 1.5 02/22/2012 0540   MONOABS 0.7 02/22/2012 0540   EOSABS 0.1 02/22/2012 0540   BASOSABS 0.0 02/22/2012 0540     BMET    Component Value Date/Time   NA 135 02/22/2012 0540   NA 135 02/22/2012 0540   K 3.6 02/22/2012 0540   K 3.6 02/22/2012 0540   CL 98 02/22/2012 0540   CL 97 02/22/2012 0540   CO2 24 02/22/2012 0540   CO2 23 02/22/2012 0540  GLUCOSE 104* 02/22/2012 0540   GLUCOSE 105* 02/22/2012 0540   BUN 46* 02/22/2012 0540   BUN 46* 02/22/2012 0540   CREATININE 5.76* 02/22/2012 0540   CREATININE 5.76* 02/22/2012 0540   CREATININE 8.17* 10/15/2011 1709   CALCIUM 10.4 02/22/2012 0540   CALCIUM 10.3 02/22/2012 0540   CALCIUM 7.6* 04/04/2011 0315   GFRNONAA 10* 02/22/2012 0540   GFRNONAA 10* 02/22/2012 0540   GFRAA 11* 02/22/2012 0540   GFRAA 11* 02/22/2012 0540     ABG    Component Value Date/Time   PHART 7.374 02/22/2012 0620   PCO2ART 42.8 02/22/2012 0620   PO2ART 81.2 02/22/2012 0620   HCO3 24.2* 02/22/2012 0620   TCO2 25.4 02/22/2012 0620   ACIDBASEDEF 0.2 02/22/2012 0620   O2SAT 95.5 02/22/2012 0620      EXAM: General: chronically ill appearing male, NAD on vent, currently having HD Neuro: awake, follows some simple commands, drowsy CV: s1s2 rrr, +sys murmu PULM: resps even non labored on vent, coarse throughout, trach intact with small amount bleeding/oozing GI: abd soft +bs Extremities:  Warm and dry, no edema    IMPRESSION/ PLAN:  Acute on chronic resp failure r/t LLL pseudomonas aspiration PNA c/b underlying COPD and rib fx r/t CPR, Afib with RVR, agitated delirium and volume overload r/t ESRD.  -- Remains vent dependent at this time.  S/p trach 7/18.   PLAN -  Start PS wean per protocol  Cont dressing to trach site - bleeding is very mild, no obvious source to suture F/u CXR  Keep dry as tol - cont HD per renal  Abx per primary  HR control  Cont current sedation and taper as able  Pt/ot per [facility]  Cultures:  6/23 urine >> strep agalactiae  6/23 resp >>strep agalactiae  6/23 bld >> pseudomonas, strep agalactiae  6/26 C diff pCR POS  6/27 blood X 2 >> neg  7/6 Sputum >>>rare candida  7/7 BAL bronch>>>few speudomonas, pansens  7/12 BAL>>> 2000cfu Pseudomonas  ABx  Pip/ Tazo 6/23 >> 6/27  cipro 6/23 >>6/27  vanc 6/23 >> 6/27  Cefepime 6/27 (pseudomonas, Strep agalactiae)>>7/9 clinical failure?  Meropenem  7/9>>>  PO vanc 6/27 (CDIFF)>> 7/15  Flagyl 6/27 (CDIFF)>>7/3  Vanc (HCAP) >>off  A:  Pseudomonas sepsis, d/t LLL PNA d/t pseudomonas  History of Hep C  Group B Strep Agalactiae bacteremia  C. Diff colitis  Mucus plug, likely HCAP on 7/6  P:  -stopped oral vanco 7/15, had 20 days  -cont merrem, had clinical failure to cefipime  Need to establish stop date. If recollapse consider tobra nebulised   PCCM will f/u twice weekly Mon/Fri please call sooner if needed.    Danford Bad, NP 02/22/2012  3:39 PM Pager: [phone]) [phone]  *Care during the described time interval was provided by me and/or other providers on the critical care team. I have reviewed this patient's available data, including medical history, events of note, physical examination and test results as part of my evaluation.   Independently examined pt, evaluated data & formulated above care plan with NP  [facility]

## 2012-02-23 ENCOUNTER — Other Ambulatory Visit (HOSPITAL_COMMUNITY): Payer: Self-pay

## 2012-02-23 LAB — CBC
Platelets: 290 10*3/uL (ref 150–400)
RDW: 15.5 % (ref 11.5–15.5)
WBC: 5.8 10*3/uL (ref 4.0–10.5)

## 2012-02-23 LAB — BASIC METABOLIC PANEL
Chloride: 98 mEq/L (ref 96–112)
Creatinine, Ser: 4.18 mg/dL — ABNORMAL HIGH (ref 0.50–1.35)
GFR calc Af Amer: 17 mL/min — ABNORMAL LOW (ref >90)
Potassium: 3.6 mEq/L (ref 3.5–5.1)

## 2012-02-24 DIAGNOSIS — Z9911 Dependence on respirator [ventilator] status: Secondary | ICD-10-CM

## 2012-02-24 DIAGNOSIS — J9620 Acute and chronic respiratory failure, unspecified whether with hypoxia or hypercapnia: Secondary | ICD-10-CM

## 2012-02-24 DIAGNOSIS — F1010 Alcohol abuse, uncomplicated: Secondary | ICD-10-CM

## 2012-02-24 DIAGNOSIS — Z930 Tracheostomy status: Secondary | ICD-10-CM

## 2012-02-24 LAB — RENAL FUNCTION PANEL
Albumin: 2 g/dL — ABNORMAL LOW (ref 3.5–5.2)
CO2: 23 mEq/L (ref 19–32)
Calcium: 10.3 mg/dL (ref 8.4–10.5)
Chloride: 96 mEq/L (ref 96–112)
GFR calc Af Amer: 11 mL/min — ABNORMAL LOW (ref >90)
GFR calc non Af Amer: 10 mL/min — ABNORMAL LOW (ref >90)
Sodium: 137 mEq/L (ref 135–145)

## 2012-02-24 LAB — CBC
Hemoglobin: 7.4 g/dL — ABNORMAL LOW (ref 13.0–17.0)
MCHC: 30.8 g/dL (ref 30.0–36.0)
Platelets: 265 10*3/uL (ref 150–400)
RDW: 15.5 % (ref 11.5–15.5)

## 2012-02-24 NOTE — Consult Note (Signed)
Patient: [name] DOB: 1956/04/05 Date of Admission: 02/21/2012            Pulmonary consult  Date of Consult: 02/24/2012 MD requesting consult:  [facility] Reason for consult:  Vent/trach management  HPI - 56 y.o. y/o male with a PMH of HTN, COPD, Hep C, polysubstance abuse, chronic recurrent pancreatitis, PUD and ESRD. Had PEA arrest on 6/24. Intubated, got emergent HD for hyperkalemia, also treated for shock (sepsis and acidosis). Found to have pseudomonas PNA & bacteremia, also strep agalactiae in blood and urine. Treated w/ antibiotics. Developed C diff on 6/26. Respiratory status marginal. Required re-intubation on 7/6, self extubated 7/10. Transferred to SDU 7/11 still had left sided atx. 7/12, back to ICU for worsening collapse on left and acute respiratory failure. 7/18 underwent percutaneous tracheostomy secondary to prolonged respiratory failure.  Tx [facility] for [facility], HD and abx.  PCCM consulted to [facility] for vent mgmt.  & bleeding around Tstomy site.  EVENTS: 7/22 - remains on fent , versed 5mg /hr and fent patch.  Full support vent   SUBJECTIVE:  No acute changes.  Remains on high dose fentanyl and versed gtt.   Vitals:  Reviewed.  Abnornal values addressed in Imp/Plan.   chest X-ray 7/20>>1. Bibasilar airspace opacities with layering pleural effusion onthe right.  2. Borderline cardiomegaly. 3. Cannot exclude right lateral eighth rib fracture.   CBC  Lab 02/24/12 0510 02/23/12 0535 02/22/12 0540  HGB 7.4* 7.6* 7.6*  HCT 24.0* 24.0* 23.8*  WBC 5.6 5.8 5.9  PLT 265 290 306    BMET  Lab 02/24/12 0510 02/23/12 0535 02/22/12 0540 02/21/12 0522 02/20/12 0325 02/19/12 0500  NA 137 138 135135 136 139 --  K 3.2* 3.6 -- -- -- --  CL 96 98 9897 99 96 --  CO2 23 28 2423 25 30 --  GLUCOSE 110* 107* 104*105* 106* 104* --  BUN 56* 36* 46*46* 27* 34* --  CREATININE 5.91* 4.18* 5.76*5.76* 3.96* 5.48* --  CALCIUM 10.3 10.0 10.410.3 10.0 9.8 --  MG --  -- 2.3 2.2 2.4 2.1  PHOS 5.1* -- 4.7*4.6 4.4 7.2* 5.5*    ABG    Component Value Date/Time   PHART 7.374 02/22/2012 0620   PCO2ART 42.8 02/22/2012 0620   PO2ART 81.2 02/22/2012 0620   HCO3 24.2* 02/22/2012 0620   TCO2 25.4 02/22/2012 0620   ACIDBASEDEF 0.2 02/22/2012 0620   O2SAT 95.5 02/22/2012 0620    EXAM: General: chronically ill appearing male, NAD on vent, currently having HD Neuro: awake, follows some simple commands, drowsy CV: s1s2 rrr, +sys murmu PULM: resps even non labored on vent, coarse throughout, trach intact -no further bleeding GI: abd soft +bs Extremities:  Warm and dry, no edema    IMPRESSION/ PLAN:  Acute on chronic resp failure r/t LLL pseudomonas aspiration PNA c/b underlying COPD and rib fx r/t CPR, Afib with RVR, agitated delirium and volume overload r/t ESRD.  -- Remains vent dependent at this time.  S/p trach 7/18.   PLAN -  Start PS wean per protocol F/u CXR  Keep dry as tol - cont HD per renal  Abx per primary  HR control  Begin weaning of sedation to off.  Continue clonazepam and fentanyl patch.  Haldol per Primary -caution with ETOH hx.  Consider Seroquel in place of haldol. Pt/ot per [facility] Bleeding at trach site resolved  Cultures:  6/23 urine >> strep agalactiae  6/23 resp >>strep agalactiae  6/23 bld >>  pseudomonas, strep agalactiae  6/26 C diff pCR POS  6/27 blood X 2 >> neg  7/6 Sputum >>>rare candida  7/7 BAL bronch>>>few speudomonas, pansens  7/12 BAL>>> 2000cfu Pseudomonas   ABx  Pip/ Tazo 6/23 >> 6/27  cipro 6/23 >>6/27  vanc 6/23 >> 6/27  Cefepime 6/27 (pseudomonas, Strep agalactiae)>>7/9 clinical failure?  Meropenem 7/9>>>  PO vanc 6/27 (CDIFF)>> 7/15  Flagyl 6/27 (CDIFF)>>7/3  Vanc (HCAP) >>off   A:  Pseudomonas sepsis, d/t LLL PNA d/t pseudomonas  History of Hep C  Group B Strep Agalactiae bacteremia  C. Diff colitis  Mucus plug, likely HCAP on 7/6   P:  -stopped oral vanco 7/15, had 20 days  -cont merrem, had  clinical failure to cefipime  -Need to establish stop date. If recollapse consider tobra nebulised   PCCM will f/u twice weekly Mon/Fri please call sooner if needed.   [name], NP-C [facility] Pgr: [phone] or [phone]      Billy Fischer, MD ; [facility] service Mobile [phone].  After 5:30 PM or weekends, call [phone]

## 2012-02-25 LAB — CBC
HCT: 30.8 % — ABNORMAL LOW (ref 39.0–52.0)
Hemoglobin: 9.9 g/dL — ABNORMAL LOW (ref 13.0–17.0)
MCH: 29.8 pg (ref 26.0–34.0)
MCHC: 32.1 g/dL (ref 30.0–36.0)
MCV: 92.8 fL (ref 78.0–100.0)
RDW: 16.3 % — ABNORMAL HIGH (ref 11.5–15.5)

## 2012-02-25 LAB — TYPE AND SCREEN
ABO/RH(D): O NEG
Antibody Screen: NEGATIVE
Unit division: 0

## 2012-02-26 ENCOUNTER — Encounter: Payer: Self-pay | Admitting: Physician Assistant

## 2012-02-26 DIAGNOSIS — K279 Peptic ulcer, site unspecified, unspecified as acute or chronic, without hemorrhage or perforation: Secondary | ICD-10-CM

## 2012-02-26 LAB — CBC WITH DIFFERENTIAL/PLATELET
Basophils Absolute: 0 10*3/uL (ref 0.0–0.1)
Basophils Relative: 0 % (ref 0–1)
Eosinophils Absolute: 0 10*3/uL (ref 0.0–0.7)
HCT: 35.8 % — ABNORMAL LOW (ref 39.0–52.0)
Hemoglobin: 11.7 g/dL — ABNORMAL LOW (ref 13.0–17.0)
MCH: 30.2 pg (ref 26.0–34.0)
MCHC: 32.7 g/dL (ref 30.0–36.0)
Monocytes Absolute: 0.8 10*3/uL (ref 0.1–1.0)
Monocytes Relative: 11 % (ref 3–12)
Neutro Abs: 5.2 10*3/uL (ref 1.7–7.7)
Neutrophils Relative %: 71 % (ref 43–77)
RDW: 15.8 % — ABNORMAL HIGH (ref 11.5–15.5)

## 2012-02-26 LAB — RENAL FUNCTION PANEL
Albumin: 2.2 g/dL — ABNORMAL LOW (ref 3.5–5.2)
BUN: 57 mg/dL — ABNORMAL HIGH (ref 6–23)
Creatinine, Ser: 5.51 mg/dL — ABNORMAL HIGH (ref 0.50–1.35)
Glucose, Bld: 107 mg/dL — ABNORMAL HIGH (ref 70–99)
Phosphorus: 4.6 mg/dL (ref 2.3–4.6)

## 2012-02-26 LAB — CBC
HCT: 35.4 % — ABNORMAL LOW (ref 39.0–52.0)
MCH: 30.1 pg (ref 26.0–34.0)
MCV: 92.7 fL (ref 78.0–100.0)
Platelets: 270 10*3/uL (ref 150–400)
RDW: 15.5 % (ref 11.5–15.5)

## 2012-02-26 NOTE — Consult Note (Signed)
Chart was reviewed and patient was examined. X-rays were reviewed.    I agree with management and plans. Pt is not actively bleeding and does not appear obstructed.  No need for GI intervention at this time.  Barbette Hair. Arlyce Dice, M.D., [facility]
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
## 2012-02-27 LAB — BASIC METABOLIC PANEL
BUN: 46 mg/dL — ABNORMAL HIGH (ref 6–23)
Creatinine, Ser: 4.51 mg/dL — ABNORMAL HIGH (ref 0.50–1.35)
GFR calc Af Amer: 15 mL/min — ABNORMAL LOW (ref >90)
GFR calc non Af Amer: 13 mL/min — ABNORMAL LOW (ref >90)

## 2012-02-27 LAB — HEMOGLOBIN AND HEMATOCRIT, BLOOD: HCT: 27.1 % — ABNORMAL LOW (ref 39.0–52.0)

## 2012-02-27 LAB — PROTIME-INR: Prothrombin Time: 14.7 seconds (ref 11.6–15.2)

## 2012-02-27 NOTE — Progress Notes (Signed)
Asked to evaluate the patient for bleeding from recent tracheostomy site.  Bleeding does not appear to be arterial, however I could not clearly see the source.  Hemostasis was achieved with local application of SurgiSeal.  Ordered DDAVP 0.4 mcg/kg IV as there is a possibility for uremic platelet dysfunction.  ASA discontinued.  [name], M.D., F.C.C.P. Pulmonary and Critical Care Medicine [facility] Cell: [phone] Pager: [phone]

## 2012-02-27 NOTE — Progress Notes (Signed)
agree

## 2012-02-27 NOTE — Discharge Summary (Signed)
Examined agree

## 2012-02-28 ENCOUNTER — Other Ambulatory Visit (HOSPITAL_COMMUNITY): Payer: Self-pay

## 2012-02-28 DIAGNOSIS — K861 Other chronic pancreatitis: Secondary | ICD-10-CM

## 2012-02-28 DIAGNOSIS — J449 Chronic obstructive pulmonary disease, unspecified: Secondary | ICD-10-CM

## 2012-02-28 DIAGNOSIS — J4489 Other specified chronic obstructive pulmonary disease: Secondary | ICD-10-CM

## 2012-02-28 LAB — RENAL FUNCTION PANEL
CO2: 26 mEq/L (ref 19–32)
Calcium: 9.9 mg/dL (ref 8.4–10.5)
Chloride: 93 mEq/L — ABNORMAL LOW (ref 96–112)
GFR calc Af Amer: 10 mL/min — ABNORMAL LOW (ref >90)
Glucose, Bld: 111 mg/dL — ABNORMAL HIGH (ref 70–99)
Sodium: 131 mEq/L — ABNORMAL LOW (ref 135–145)

## 2012-02-28 LAB — CBC
Hemoglobin: 8 g/dL — ABNORMAL LOW (ref 13.0–17.0)
MCH: 30.2 pg (ref 26.0–34.0)
MCV: 91.7 fL (ref 78.0–100.0)
RBC: 2.65 MIL/uL — ABNORMAL LOW (ref 4.22–5.81)

## 2012-02-28 NOTE — Procedures (Signed)
Trach procedure ETT placed, trach old removed. Suctioned, silk only available, noted bleeder x 3 small artery at soft tissue 2 pm of wound After application of sutures, hemostasis obtained Then replaced ett 6 with surgiseal around Dry bed. Cap pos, good BS Blood loss less 10 cc during  Will dc sutures on trach placed in 48 hrs.  Mcarthur Rossetti. Tyson Alias, MD, FACP Pgr: [phone] [place] [facility]

## 2012-02-28 NOTE — Consult Note (Signed)
Patient: [name] DOB: [birth year] Date of Admission: 02/21/2012            Pulmonary consult  Date of Consult: 02/28/2012 MD requesting consult:  [facility] Reason for consult:  Vent/trach management  HPI - 56 y.o. y/o male with a PMH of HTN, COPD, Hep C, polysubstance abuse, chronic recurrent pancreatitis, PUD and ESRD. Had PEA arrest on 6/24. Intubated, got emergent HD for hyperkalemia, also treated for shock (sepsis and acidosis). Found to have pseudomonas PNA & bacteremia, also strep agalactiae in blood and urine. Treated w/ antibiotics. Developed C diff on 6/26. Respiratory status marginal. Required re-intubation on 7/6, self extubated 7/10. Transferred to SDU 7/11 still had left sided atx. 7/12, back to ICU for worsening collapse on left and acute respiratory failure. 7/18 underwent percutaneous tracheostomy secondary to prolonged respiratory failure.  Tx 7/19 to [facility] for vent wean, HD and abx.  PCCM consulted to [facility] for vent mgmt.  & bleeding around Tstomy site.  EVENTS: 7/22 - remains on fent , versed 5mg /hr and fent patch.  Full support vent 7/25- bleeding trach site 7/26- ETT placed, trach removed, hemostasis after ligation 3 small artery at skin / fat level   SUBJECTIVE:  Remains on vent  Vitals:  Reviewed.  Abnornal values addressed in Imp/Plan.   chest X-ray 7/20>>1. Bibasilar airspace opacities with layering pleural effusion onthe right.  2. Borderline cardiomegaly. 3. Cannot exclude right lateral eighth rib fracture.   CBC  Lab 02/28/12 0529 02/27/12 2235 02/27/12 1537 02/26/12 0659 02/26/12 0511  HGB 8.0* 8.7* 9.8* -- --  HCT 24.3* 27.1* 29.6* -- --  WBC 7.8 -- -- 7.4 7.4  PLT 253 -- -- 262 270    BMET  Lab 02/28/12 0527 02/27/12 0618 02/26/12 0511 02/24/12 0510 02/23/12 0535 02/22/12 0540  NA 131* 135 140 137 138 --  K 4.7 4.7 -- -- -- --  CL 93* 93* 96 96 98 --  CO2 26 29 29 23 28  --  GLUCOSE 111* 111* 107* 110* 107* --  BUN 70* 46* 57* 56*  36* --  CREATININE 6.29* 4.51* 5.51* 5.91* 4.18* --  CALCIUM 9.9 10.1 10.6* 10.3 10.0 --  MG -- 2.3 -- -- -- 2.3  PHOS 3.9 -- 4.6 5.1* -- 4.7*4.6    ABG    Component Value Date/Time   PHART 7.374 02/22/2012 0620   PCO2ART 42.8 02/22/2012 0620   PO2ART 81.2 02/22/2012 0620   HCO3 24.2* 02/22/2012 0620   TCO2 25.4 02/22/2012 0620   ACIDBASEDEF 0.2 02/22/2012 0620   O2SAT 95.5 02/22/2012 0620    EXAM: General: agitated, bloody gauze on trach Neuro: awake, follows some simple commands, drowsy, trying to hit me, successfully x 2 CV: s1s2 rrr, +sys murmu PULM: bloody secretions, ronchi GI: abd soft +bs Extremities:  Warm and dry, no edema    IMPRESSION/ PLAN:  Acute on chronic resp failure r/t LLL pseudomonas aspiration PNA c/b underlying COPD and rib fx r/t CPR, Afib with RVR, agitated delirium and volume overload r/t ESRD.  -- Remains vent dependent at this time.  S/p trach 7/18. NOW trach bleeder and r/o asp bloody likley   PLAN -  IN am likely then back to PS wean per protocol F/u CXR now post aspiration blood overnight Keep dry as tol - cont HD per renal  ddavp given for bleeding Propofol to dc now post procedure Keep calm and avoiding trauma to trach site Placed 6 cuff Dc sutures in 48 hrs on  trach Remain on full support overnight  Will follow up Monday if needed call Dr Craige Cotta overweekend  [name]. [name] Alias, MD, FACP Pgr: [phone] [place] [facility]

## 2012-02-28 NOTE — Procedures (Signed)
Intubation Procedure Note EUELL SCHIFF [id number] [birth year]  Procedure: Intubation Indications: Airway protection and maintenance to dc trach and fix bleeding  Procedure Details Consent: Unable to obtain consent because of emergent medical necessity. Time Out: Verified patient identification, verified procedure, site/side was marked, verified correct patient position, special equipment/implants available, medications/allergies/relevent history reviewed, required imaging and test results available.  Performed  Maximum sterile technique was used including antiseptics, cap, gloves, gown, hand hygiene and mask.  MAC    Evaluation Hemodynamic Status: BP stable throughout; O2 sats: stable throughout Patient's Current Condition: stable Complications: No apparent complications Patient did tolerate procedure well. Chest X-ray ordered to verify placement.  CXR: pending.   Nelda Bucks. 02/28/2012   Needed better eval of trach site, ett placed, trach removed, see additional noted.  Mcarthur Rossetti. Tyson Alias, MD, FACP Pgr: [phone] [place] [facility]

## 2012-02-29 LAB — BASIC METABOLIC PANEL
CO2: 30 mEq/L (ref 19–32)
Chloride: 98 mEq/L (ref 96–112)
Creatinine, Ser: 4.08 mg/dL — ABNORMAL HIGH (ref 0.50–1.35)
GFR calc Af Amer: 17 mL/min — ABNORMAL LOW (ref >90)
Potassium: 3.9 mEq/L (ref 3.5–5.1)
Sodium: 138 mEq/L (ref 135–145)

## 2012-02-29 LAB — PREPARE RBC (CROSSMATCH)

## 2012-02-29 LAB — CBC
MCV: 92.6 fL (ref 78.0–100.0)
Platelets: 231 10*3/uL (ref 150–400)
RBC: 2.29 MIL/uL — ABNORMAL LOW (ref 4.22–5.81)
WBC: 5.9 10*3/uL (ref 4.0–10.5)

## 2012-03-01 LAB — BASIC METABOLIC PANEL
Calcium: 9.6 mg/dL (ref 8.4–10.5)
GFR calc Af Amer: 11 mL/min — ABNORMAL LOW (ref >90)
GFR calc non Af Amer: 9 mL/min — ABNORMAL LOW (ref >90)
Glucose, Bld: 132 mg/dL — ABNORMAL HIGH (ref 70–99)
Potassium: 4.9 mEq/L (ref 3.5–5.1)
Sodium: 133 mEq/L — ABNORMAL LOW (ref 135–145)

## 2012-03-01 LAB — TYPE AND SCREEN
ABO/RH(D): O NEG
Antibody Screen: NEGATIVE
Unit division: 0

## 2012-03-01 LAB — CBC
MCH: 30.2 pg (ref 26.0–34.0)
MCHC: 34 g/dL (ref 30.0–36.0)
RDW: 16.7 % — ABNORMAL HIGH (ref 11.5–15.5)

## 2012-03-02 ENCOUNTER — Other Ambulatory Visit (HOSPITAL_COMMUNITY): Payer: Self-pay

## 2012-03-02 DIAGNOSIS — D649 Anemia, unspecified: Secondary | ICD-10-CM

## 2012-03-02 DIAGNOSIS — N186 End stage renal disease: Secondary | ICD-10-CM

## 2012-03-02 DIAGNOSIS — E875 Hyperkalemia: Secondary | ICD-10-CM

## 2012-03-02 LAB — RENAL FUNCTION PANEL
Albumin: 2.2 g/dL — ABNORMAL LOW (ref 3.5–5.2)
BUN: 96 mg/dL — ABNORMAL HIGH (ref 6–23)
Chloride: 91 mEq/L — ABNORMAL LOW (ref 96–112)
Creatinine, Ser: 7.29 mg/dL — ABNORMAL HIGH (ref 0.50–1.35)
GFR calc non Af Amer: 7 mL/min — ABNORMAL LOW (ref >90)
Phosphorus: 5.7 mg/dL — ABNORMAL HIGH (ref 2.3–4.6)
Potassium: 4.9 mEq/L (ref 3.5–5.1)

## 2012-03-02 LAB — CBC
MCH: 29.6 pg (ref 26.0–34.0)
MCV: 88.2 fL (ref 78.0–100.0)
Platelets: 217 10*3/uL (ref 150–400)
RBC: 2.8 MIL/uL — ABNORMAL LOW (ref 4.22–5.81)
RDW: 16 % — ABNORMAL HIGH (ref 11.5–15.5)

## 2012-03-02 NOTE — Consult Note (Signed)
Patient: [name] DOB: 22-Jan-1956 Date of Admission: 02/21/2012            Pulmonary consult  Date of Consult: 03/02/2012 MD requesting consult:  [facility] Reason for consult:  Vent/trach management  HPI - 56 y.o. y/o male with a PMH of HTN, COPD, Hep C, polysubstance abuse, chronic recurrent pancreatitis, PUD and ESRD. Had PEA arrest on 6/24. Intubated, got emergent HD for hyperkalemia, also treated for shock (sepsis and acidosis). Found to have pseudomonas PNA & bacteremia, also strep agalactiae in blood and urine. Treated w/ antibiotics. Developed C diff on 6/26. Respiratory status marginal. Required re-intubation on 7/6, self extubated 7/10. Transferred to SDU 7/11 still had left sided atx. 7/12, back to ICU for worsening collapse on left and acute respiratory failure. 7/18 underwent percutaneous tracheostomy secondary to prolonged respiratory failure.  Tx [facility] for [facility], HD and abx.  PCCM consulted to [facility] for vent mgmt.  & bleeding around Tstomy site.  EVENTS: 7/22 - remains on fent , versed 5mg /hr and fent patch.  Full support vent 7/25- bleeding trach site 7/26- ETT placed, trach removed, hemostasis after ligation 3 small artery at skin / fat level   SUBJECTIVE:  Remains on vent, PS weaning, no bleeding  Vitals:  Reviewed.  Abnornal values addressed in Imp/Plan.   chest X-ray 7/29- increased basialr edema, infiltrate   CBC  Lab 03/02/12 0700 03/01/12 1105 02/29/12 0630  HGB 8.3* 8.3* 6.9*  HCT 24.7* 24.4* 21.2*  WBC 6.0 6.7 5.9  PLT 217 220 231    BMET  Lab 03/02/12 0700 03/01/12 1105 02/29/12 0630 02/28/12 0527 02/27/12 0618 02/26/12 0511  NA 130* 133* 138 131* 135 --  K 4.9 4.9 -- -- -- --  CL 91* 94* 98 93* 93* --  CO2 25 27 30 26 29  --  GLUCOSE 100* 132* 120* 111* 111* --  BUN 96* 74* 43* 70* 46* --  CREATININE 7.29* 6.09* 4.08* 6.29* 4.51* --  CALCIUM 9.7 9.6 9.3 9.9 10.1 --  MG -- -- -- -- 2.3 --  PHOS 5.7* -- -- 3.9 -- 4.6     ABG    Component Value Date/Time   PHART 7.374 02/22/2012 0620   PCO2ART 42.8 02/22/2012 0620   PO2ART 81.2 02/22/2012 0620   HCO3 24.2* 02/22/2012 0620   TCO2 25.4 02/22/2012 0620   ACIDBASEDEF 0.2 02/22/2012 0620   O2SAT 95.5 02/22/2012 0620    EXAM: General: agitated, clean as a whistle trach site Neuro: awake, follows some simple commands, drowsy, did not try and hit me in my face today CV: s1s2 rrr, +sys murmu PULM: bloody secretions, ronchi resolving, now crackles GI: abd soft +bs Extremities:  Warm and dry, no edema    IMPRESSION/ PLAN:  Acute on chronic resp failure r/t LLL pseudomonas aspiration PNA c/b underlying COPD and rib fx r/t CPR, Afib with RVR, agitated delirium and volume overload r/t ESRD.  -- Remains vent dependent at this time.  S/p trach 7/18. Resolved trach arterial bleeders PLAN -  pcxr c/w edema, for HD likley RT can remove sutures on trach today I will remove arterial sutures 0 silk in am tues myself Weaning PS 10, goal 4 hrs, allow longer to 6-78 if aBLE pcxr follow up in 48 hrs recommended Cbc appears stable  Mcarthur Rossetti. Tyson Alias, MD, FACP Pgr: [phone] [place] [facility]

## 2012-03-02 NOTE — Progress Notes (Signed)
Patient: [name] DOB: 02/21/1956 Date of Admission: 02/21/2012            [place] PCCM  Date of Consult: 03/02/2012 MD requesting consult:  [facility] Reason for consult:  Vent/trach management  HPI - 56 y.o. y/o male with a PMH of HTN, COPD, Hep C, polysubstance abuse, chronic recurrent pancreatitis, PUD and ESRD. Had PEA arrest on 6/24. Intubated, got emergent HD for hyperkalemia, also treated for shock (sepsis and acidosis). Found to have pseudomonas PNA & bacteremia, also strep agalactiae in blood and urine. Treated w/ antibiotics. Developed C diff on 6/26. Respiratory status marginal. Required re-intubation on 7/6, self extubated 7/10. Transferred to SDU 7/11 still had left sided atx. 7/12, back to ICU for worsening collapse on left and acute respiratory failure. 7/18 underwent percutaneous tracheostomy secondary to prolonged respiratory failure.  [facility] for [facility], HD and abx.  PCCM consulted to [facility] for vent mgmt.  & bleeding around Tstomy site.  EVENTS: 7/22 - remains on fent , versed 5mg /hr and fent patch.  Full support vent 7/25- bleeding trach site 7/26- ETT placed, trach removed, hemostasis after ligation 3 small artery at skin / fat level   SUBJECTIVE:  Remains on vent.  Planned 4 hours PS today.    Vitals:  Reviewed.  Abnornal values addressed in Imp/Plan.   chest X-ray 7/29>>> Slight increase R effusion/ atx and unchanged L basilar atx/consolidation    CBC  Lab 03/02/12 0700 03/01/12 1105 02/29/12 0630  HGB 8.3* 8.3* 6.9*  HCT 24.7* 24.4* 21.2*  WBC 6.0 6.7 5.9  PLT 217 220 231    BMET  Lab 03/02/12 0700 03/01/12 1105 02/29/12 0630 02/28/12 0527 02/27/12 0618 02/26/12 0511  NA 130* 133* 138 131* 135 --  K 4.9 4.9 -- -- -- --  CL 91* 94* 98 93* 93* --  CO2 25 27 30 26 29  --  GLUCOSE 100* 132* 120* 111* 111* --  BUN 96* 74* 43* 70* 46* --  CREATININE 7.29* 6.09* 4.08* 6.29* 4.51* --  CALCIUM 9.7 9.6 9.3 9.9 10.1 --  MG -- -- -- --  2.3 --  PHOS 5.7* -- -- 3.9 -- 4.6    ABG    Component Value Date/Time   PHART 7.374 02/22/2012 0620   PCO2ART 42.8 02/22/2012 0620   PO2ART 81.2 02/22/2012 0620   HCO3 24.2* 02/22/2012 0620   TCO2 25.4 02/22/2012 0620   ACIDBASEDEF 0.2 02/22/2012 0620   O2SAT 95.5 02/22/2012 0620    EXAM: General: agitated, bloody gauze on trach Neuro: awake, follows some simple commands, drowsy CV: s1s2 rrr, +sys murmu PULM: faint bloody secretions, ronchi GI: abd soft +bs Extremities:  Warm and dry, no edema    IMPRESSION/ PLAN:  Acute on chronic resp failure r/t LLL pseudomonas aspiration PNA c/b underlying COPD and rib fx r/t CPR, Afib with RVR, agitated delirium and volume overload r/t ESRD.  -- Remains vent dependent at this time.  S/p trach 7/18. Bleeding much improved.  PLAN -  PS wean per protocol - planned 4 hours 7/29 Keep dry as tol - cont HD per renal  Keep calm and avoiding trauma to trach site Cont 6 cuffed trach  F/u CXR  If effusion worsens or seems to hinder weaning may need to consider Belenda Cruise, NP 03/02/2012  11:51 AM Pager: [phone] or [phone]  *Care during the described time interval was provided by me and/or other providers on the critical care team.  I have reviewed this patient's available data, including medical history, events of note, physical examination and test results as part of my evaluation.

## 2012-03-04 LAB — CBC
MCHC: 33.1 g/dL (ref 30.0–36.0)
Platelets: 216 10*3/uL (ref 150–400)
RDW: 15.3 % (ref 11.5–15.5)
WBC: 6.4 10*3/uL (ref 4.0–10.5)

## 2012-03-04 LAB — RENAL FUNCTION PANEL
Albumin: 2.3 g/dL — ABNORMAL LOW (ref 3.5–5.2)
Chloride: 88 mEq/L — ABNORMAL LOW (ref 96–112)
GFR calc Af Amer: 11 mL/min — ABNORMAL LOW (ref >90)
GFR calc non Af Amer: 9 mL/min — ABNORMAL LOW (ref >90)
Phosphorus: 4.6 mg/dL (ref 2.3–4.6)
Potassium: 4.3 mEq/L (ref 3.5–5.1)
Sodium: 130 mEq/L — ABNORMAL LOW (ref 135–145)

## 2012-03-04 NOTE — Consult Note (Signed)
Patient: [name] DOB: 09/12/1955 Date of Admission: 02/21/2012            Pulmonary consult  Date of Consult: 03/04/2012 MD requesting consult:  [facility] Reason for consult:  Vent/trach management  HPI - 56 y.o. y/o male with a PMH of HTN, COPD, Hep C, polysubstance abuse, chronic recurrent pancreatitis, PUD and ESRD. Had PEA arrest on 6/24. Intubated, got emergent HD for hyperkalemia, also treated for shock (sepsis and acidosis). Found to have pseudomonas PNA & bacteremia, also strep agalactiae in blood and urine. Treated w/ antibiotics. Developed C diff on 6/26. Respiratory status marginal. Required re-intubation on 7/6, self extubated 7/10. Transferred to SDU 7/11 still had left sided atx. 7/12, back to ICU for worsening collapse on left and acute respiratory failure. 7/18 underwent percutaneous tracheostomy secondary to prolonged respiratory failure.  Tx [facility], HD and abx.  PCCM consulted to [facility] for vent mgmt.  & bleeding around Tstomy site.  EVENTS: 7/22 - remains on fent , versed 5mg /hr and fent patch.  Full support vent 7/25- bleeding trach site 7/26- ETT placed, trach removed, hemostasis after ligation 3 small artery at skin / fat level   SUBJECTIVE: weaning  PS 16 hrs goal today  Vitals:  Reviewed.  Abnornal values addressed in Imp/Plan.   chest X-ray 7/29- increased basialr edema, infiltrate   CBC  Lab 03/04/12 0635 03/02/12 0700 03/01/12 1105  HGB 8.1* 8.3* 8.3*  HCT 24.5* 24.7* 24.4*  WBC 6.4 6.0 6.7  PLT 216 217 220    BMET  Lab 03/04/12 0435 03/02/12 0700 03/01/12 1105 02/29/12 0630 02/28/12 0527 02/27/12 0618  NA 130* 130* 133* 138 131* --  K 4.3 4.9 -- -- -- --  CL 88* 91* 94* 98 93* --  CO2 28 25 27 30 26  --  GLUCOSE 97 100* 132* 120* 111* --  BUN 76* 96* 74* 43* 70* --  CREATININE 6.04* 7.29* 6.09* 4.08* 6.29* --  CALCIUM 9.6 9.7 9.6 9.3 9.9 --  MG -- -- -- -- -- 2.3  PHOS 4.6 5.7* -- -- 3.9 --    ABG      Component Value Date/Time   PHART 7.374 02/22/2012 0620   PCO2ART 42.8 02/22/2012 0620   PO2ART 81.2 02/22/2012 0620   HCO3 24.2* 02/22/2012 0620   TCO2 25.4 02/22/2012 0620   ACIDBASEDEF 0.2 02/22/2012 0620   O2SAT 95.5 02/22/2012 0620    EXAM: General: less agitated Neuro: awake, follows some simple commands, not trying to beat on  Me today CV: s1s2 rrr, + m  PULM: bloody secretions all resolved, wound clean GI: abd soft +bs Extremities:  Warm and dry, no edema    IMPRESSION/ PLAN:  Acute on chronic resp failure r/t LLL pseudomonas aspiration PNA c/b underlying COPD and rib fx r/t CPR, Afib with RVR, agitated delirium and volume overload r/t ESRD.  -- Remains vent dependent at this time.  S/p trach 7/18. Resolved trach arterial bleeders PLAN -  Trach sutures dc today I will look at the 3 arterial skin sutures and attempt to remove, if unable,. Ok to remain Weaning well today goal to 16 hrs, if successful then to trach collar in am  May be able to advance faster than protocol Follow up pcxr in future , rt base  Mcarthur Rossetti. Tyson Alias, MD, FACP Pgr: [phone] [facility]

## 2012-03-05 LAB — CBC
MCH: 29.4 pg (ref 26.0–34.0)
Platelets: 230 10*3/uL (ref 150–400)
RBC: 2.69 MIL/uL — ABNORMAL LOW (ref 4.22–5.81)

## 2012-03-06 LAB — RENAL FUNCTION PANEL
Albumin: 2.3 g/dL — ABNORMAL LOW (ref 3.5–5.2)
BUN: 69 mg/dL — ABNORMAL HIGH (ref 6–23)
Chloride: 90 mEq/L — ABNORMAL LOW (ref 96–112)
GFR calc non Af Amer: 9 mL/min — ABNORMAL LOW (ref >90)
Potassium: 3.8 mEq/L (ref 3.5–5.1)

## 2012-03-06 LAB — CBC
HCT: 24.2 % — ABNORMAL LOW (ref 39.0–52.0)
Platelets: 240 10*3/uL (ref 150–400)
RDW: 15.1 % (ref 11.5–15.5)
WBC: 5.7 10*3/uL (ref 4.0–10.5)

## 2012-03-06 NOTE — Progress Notes (Signed)
Patient: [name] DOB: 08-01-1956 Date of Admission: 02/21/2012            Pulmonary consult  Date of Consult: 03/06/2012 MD requesting consult:  [facility] Reason for consult:  Vent/trach management  HPI - 56 y.o. y/o male with a PMH of HTN, COPD, Hep C, polysubstance abuse, chronic recurrent pancreatitis, PUD and ESRD. Had PEA arrest on 6/24. Intubated, got emergent HD for hyperkalemia, also treated for shock (sepsis and acidosis). Found to have pseudomonas PNA & bacteremia, also strep agalactiae in blood and urine. Treated w/ antibiotics. Developed C diff on 6/26. Respiratory status marginal. Required re-intubation on 7/6, self extubated 7/10. Transferred to SDU 7/11 still had left sided atx. 7/12, back to ICU for worsening collapse on left and acute respiratory failure. 7/18 underwent percutaneous tracheostomy secondary to prolonged respiratory failure.  [facility], HD and abx.  PCCM consulted to [facility] for vent mgmt.  & bleeding around Tstomy site.  EVENTS: 7/22 - remains on fent , versed 5mg /hr and fent patch.  Full support vent 7/25- bleeding trach site 7/26- ETT placed, trach removed, hemostasis after ligation 3 small artery at skin / fat level  SUBJECTIVE: t collar 16 hours  Vitals:  Vital signs reviewed. Abnormal values will appear under impression plan section.    chest X-ray No results found.    CBC  Lab 03/06/12 0515 03/05/12 0530 03/04/12 0635  HGB 8.0* 7.9* 8.1*  HCT 24.2* 24.2* 24.5*  WBC 5.7 5.7 6.4  PLT 240 230 216    BMET  Lab 03/06/12 0515 03/04/12 0435 03/02/12 0700 03/01/12 1105 02/29/12 0630  NA 132* 130* 130* 133* 138  K 3.8 4.3 -- -- --  CL 90* 88* 91* 94* 98  CO2 32 28 25 27 30   GLUCOSE 113* 97 100* 132* 120*  BUN 69* 76* 96* 74* 43*  CREATININE 5.99* 6.04* 7.29* 6.09* 4.08*  CALCIUM 9.7 9.6 9.7 9.6 9.3  MG -- -- -- -- --  PHOS 4.8* 4.6 5.7* -- --    ABG    Component Value Date/Time   PHART 7.374 02/22/2012  0620   PCO2ART 42.8 02/22/2012 0620   PO2ART 81.2 02/22/2012 0620   HCO3 24.2* 02/22/2012 0620   TCO2 25.4 02/22/2012 0620   ACIDBASEDEF 0.2 02/22/2012 0620   O2SAT 95.5 02/22/2012 0620    EXAM: General: less agitated. But in restaints Neuro: awake, follows some simple commands, more cooperative CV: s1s2 rrr, + m  PULM: mild rhonchi GI: abd soft +bs, ngt with tube feeds and eats Extremities:  Warm and dry, no edema    IMPRESSION/ PLAN:  Acute on chronic resp failure r/t LLL pseudomonas aspiration PNA c/b underlying COPD and rib fx r/t CPR, Afib with RVR, agitated delirium and volume overload r/t ESRD.  -- Remains vent dependent at this time.  S/p trach 7/18. Resolved trach arterial bleeders PLAN -  -Wean per protocol, slow on trach collar , hope for 24 hrs by sunday -monitor trach for any complications -leave original silk sutures in place, unable to remove now -pcxr for rt base Peggye Ley Minor ACNP [facility] PCCM Pager [phone] till 3 pm If no answer page [phone] 03/06/2012, 2:28 PM   I have fully examined this patient and agree with above findings.    And edited infull  Mcarthur Rossetti. Tyson Alias, MD, FACP Pgr: [phone] [facility]

## 2012-03-09 ENCOUNTER — Other Ambulatory Visit (HOSPITAL_COMMUNITY): Payer: Self-pay

## 2012-03-09 LAB — RENAL FUNCTION PANEL
Calcium: 9.5 mg/dL (ref 8.4–10.5)
GFR calc Af Amer: 10 mL/min — ABNORMAL LOW (ref >90)
GFR calc non Af Amer: 8 mL/min — ABNORMAL LOW (ref >90)
Phosphorus: 3.5 mg/dL (ref 2.3–4.6)
Sodium: 134 mEq/L — ABNORMAL LOW (ref 135–145)

## 2012-03-09 LAB — CBC
MCHC: 33.9 g/dL (ref 30.0–36.0)
RDW: 14.9 % (ref 11.5–15.5)

## 2012-03-09 LAB — PTH, INTACT AND CALCIUM
Calcium, Total (PTH): 9.3 mg/dL (ref 8.4–10.5)
PTH: 21.7 pg/mL (ref 14.0–72.0)

## 2012-03-09 NOTE — Progress Notes (Signed)
Patient: [name] DOB: Dec 01, 1955 Date of Admission: 02/21/2012            Pulmonary consult  Date of Consult: 03/09/2012 MD requesting consult:  [facility] Reason for consult:  Vent/trach management  HPI - 56 y.o. y/o male with a PMH of HTN, COPD, Hep C, polysubstance abuse, chronic recurrent pancreatitis, PUD and ESRD. Had PEA arrest on 6/24. Intubated, got emergent HD for hyperkalemia, also treated for shock (sepsis and acidosis). Found to have pseudomonas PNA & bacteremia, also strep agalactiae in blood and urine. Treated w/ antibiotics. Developed C diff on 6/26. Respiratory status marginal. Required re-intubation on 7/6, self extubated 7/10. Transferred to SDU 7/11 still had left sided atx. 7/12, back to ICU for worsening collapse on left and acute respiratory failure. 7/18 underwent percutaneous tracheostomy secondary to prolonged respiratory failure.  Tx [facility] for [facility], HD and abx.  PCCM consulted to [facility] for vent mgmt.  & bleeding around Tstomy site.  EVENTS: 7/22 - remains on fent , versed 5mg /hr and fent patch.  Full support vent 7/25- bleeding trach site 7/26- ETT placed, trach removed, hemostasis after ligation 3 small artery at skin / fat level  SUBJECTIVE: t collar 16 hours  Vitals:  Vital signs reviewed. Abnormal values will appear under impression plan section.    chest X-ray Dg Chest Port 1 View  03/09/2012  *RADIOLOGY REPORT*  Clinical Data: Tracheostomy  PORTABLE CHEST - 1 VIEW  Comparison: 03/02/2012  Findings: Cardiomediastinal silhouette is stable.  Tracheostomy tube is unchanged in position.  Stable NG tube position and left IJ central line position. Small right pleural effusion with right lower lobe atelectasis or infiltrate.  Stable left basilar atelectasis or infiltrate.  IMPRESSION: Tracheostomy tube is unchanged in position.  Stable NG tube position and left IJ central line position. Small right pleural effusion with right lower lobe  atelectasis or infiltrate.  Stable left basilar atelectasis or infiltrate.  Original Report Authenticated By: Natasha Mead, M.D.      CBC  Lab 03/09/12 0455 03/06/12 0515 03/05/12 0530  HGB 7.7* 8.0* 7.9*  HCT 22.7* 24.2* 24.2*  WBC 5.5 5.7 5.7  PLT 227 240 230    BMET  Lab 03/09/12 0455 03/06/12 0515 03/04/12 0435  NA 134* 132* 130*  K 4.1 3.8 --  CL 94* 90* 88*  CO2 29 32 28  GLUCOSE 102* 113* 97  BUN 78* 69* 76*  CREATININE 6.50* 5.99* 6.04*  CALCIUM 9.5 9.7 9.6  MG -- -- --  PHOS 3.5 4.8* 4.6    ABG    Component Value Date/Time   PHART 7.374 02/22/2012 0620   PCO2ART 42.8 02/22/2012 0620   PO2ART 81.2 02/22/2012 0620   HCO3 24.2* 02/22/2012 0620   TCO2 25.4 02/22/2012 0620   ACIDBASEDEF 0.2 02/22/2012 0620   O2SAT 95.5 02/22/2012 0620    EXAM: General: less agitated. But in restaints Neuro: awake, follows some simple commands, more cooperative CV: s1s2 rrr, + m  PULM: mild rhonchi GI: abd soft +bs, ngt with tube feeds and eats Extremities:  Warm and dry, no edema    IMPRESSION/ PLAN:  Acute on chronic resp failure r/t LLL pseudomonas aspiration PNA c/b underlying COPD and rib fx r/t CPR, Afib with RVR, agitated delirium and volume overload r/t ESRD.  -- Remains vent dependent at this time.  S/p trach 7/18. Resolved trach arterial bleeders PLAN -  -Continue TC for now 24/7, will allow that for a week before consideration  for decannulation. -Monitor trach for any complications. -Leave original silk sutures in place, unable to remove now. -pCXR for rt base noted, no change in plan.  Alyson Reedy, M.D. [facility]Critical Care Medicine. Pager: [phone]. After hours pager: [phone].

## 2012-03-10 LAB — PARATHYROID HORMONE, INTACT (NO CA): PTH: 13.8 pg/mL — ABNORMAL LOW (ref 14.0–72.0)

## 2012-03-10 IMAGING — CR DG CHEST 2V
2 series · 2 of 2 positions shown · non-contrast
Comparison: 07/19/2011

CLINICAL DATA: Cough.  Short of breath.

CHEST - 2 VIEW

[w chest pa]
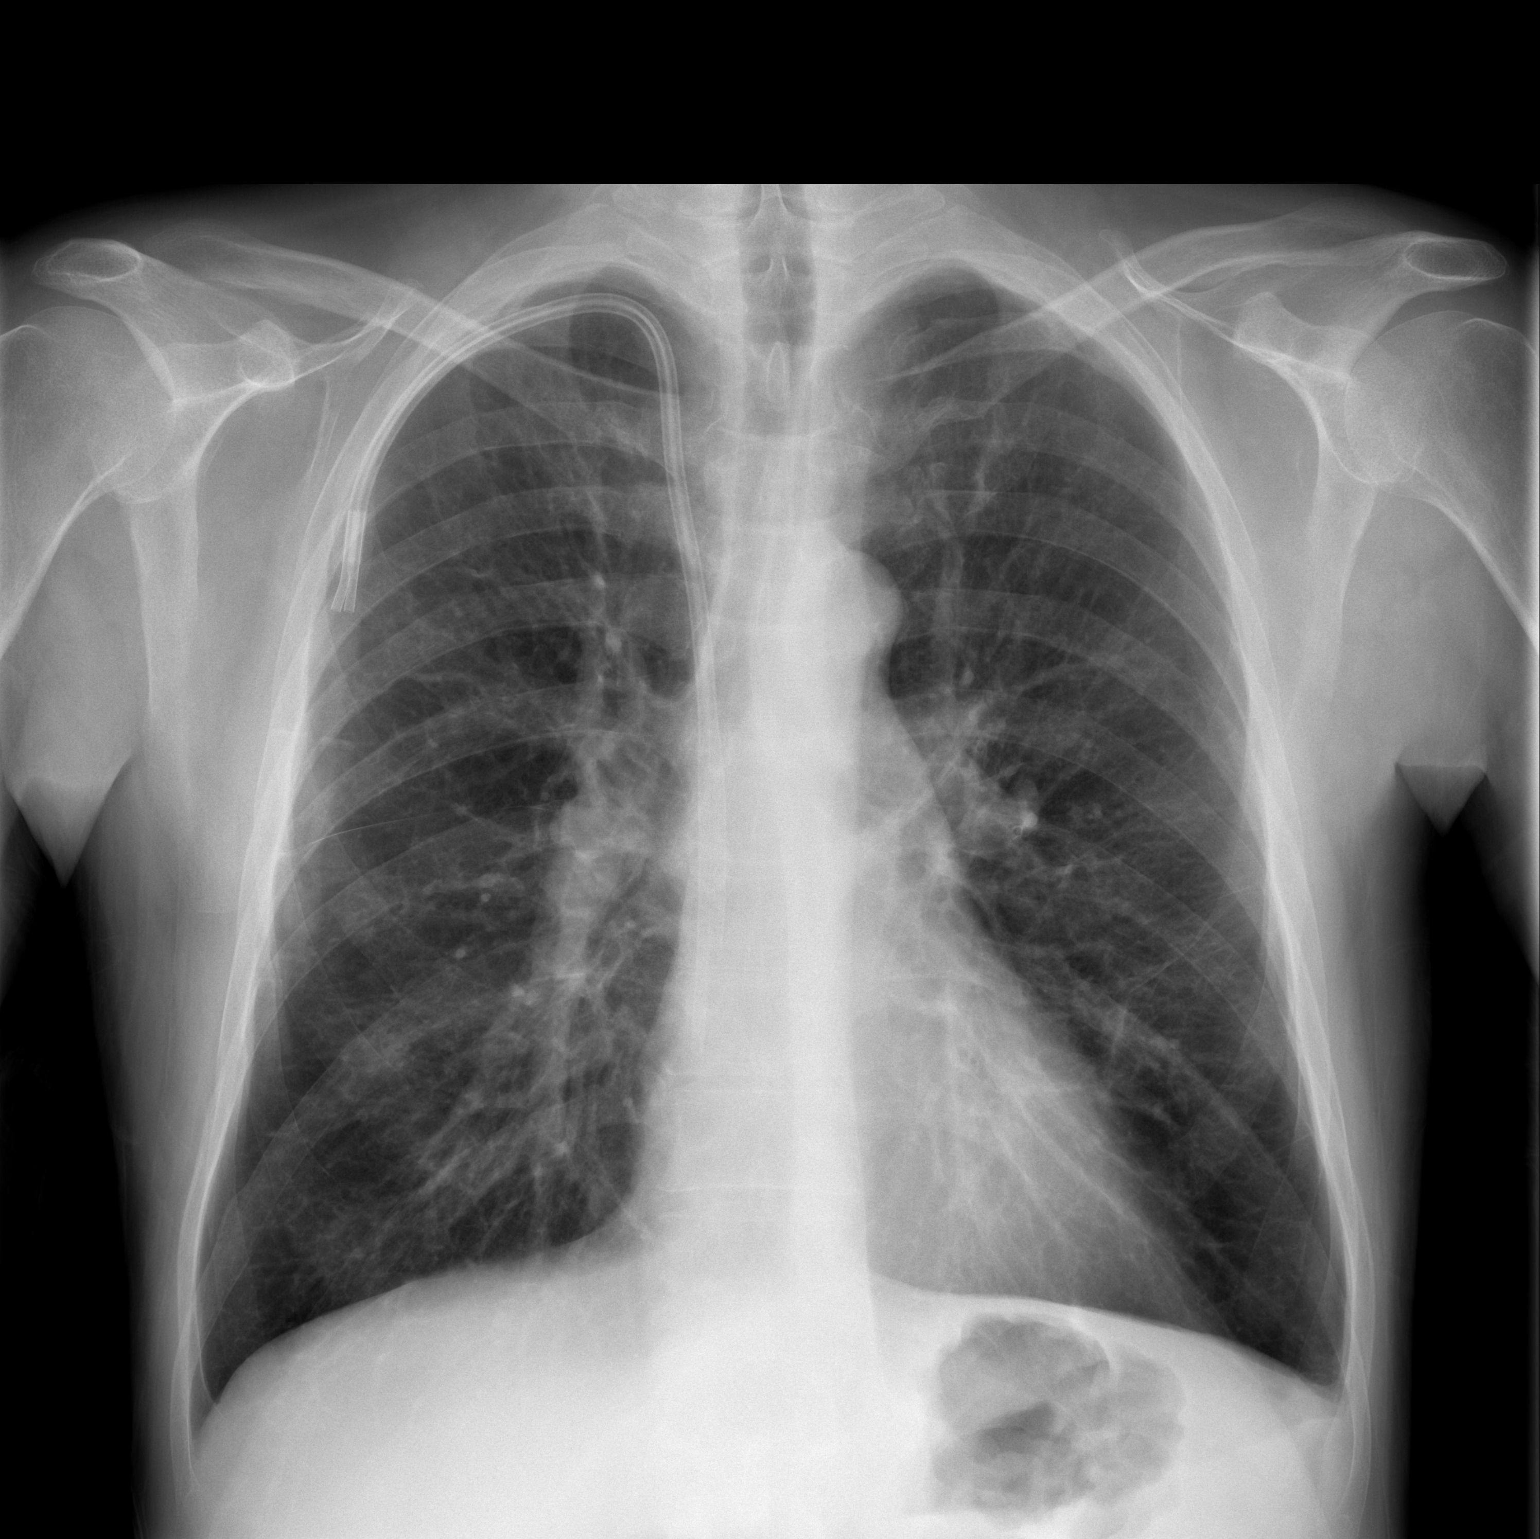

[w chest lat]
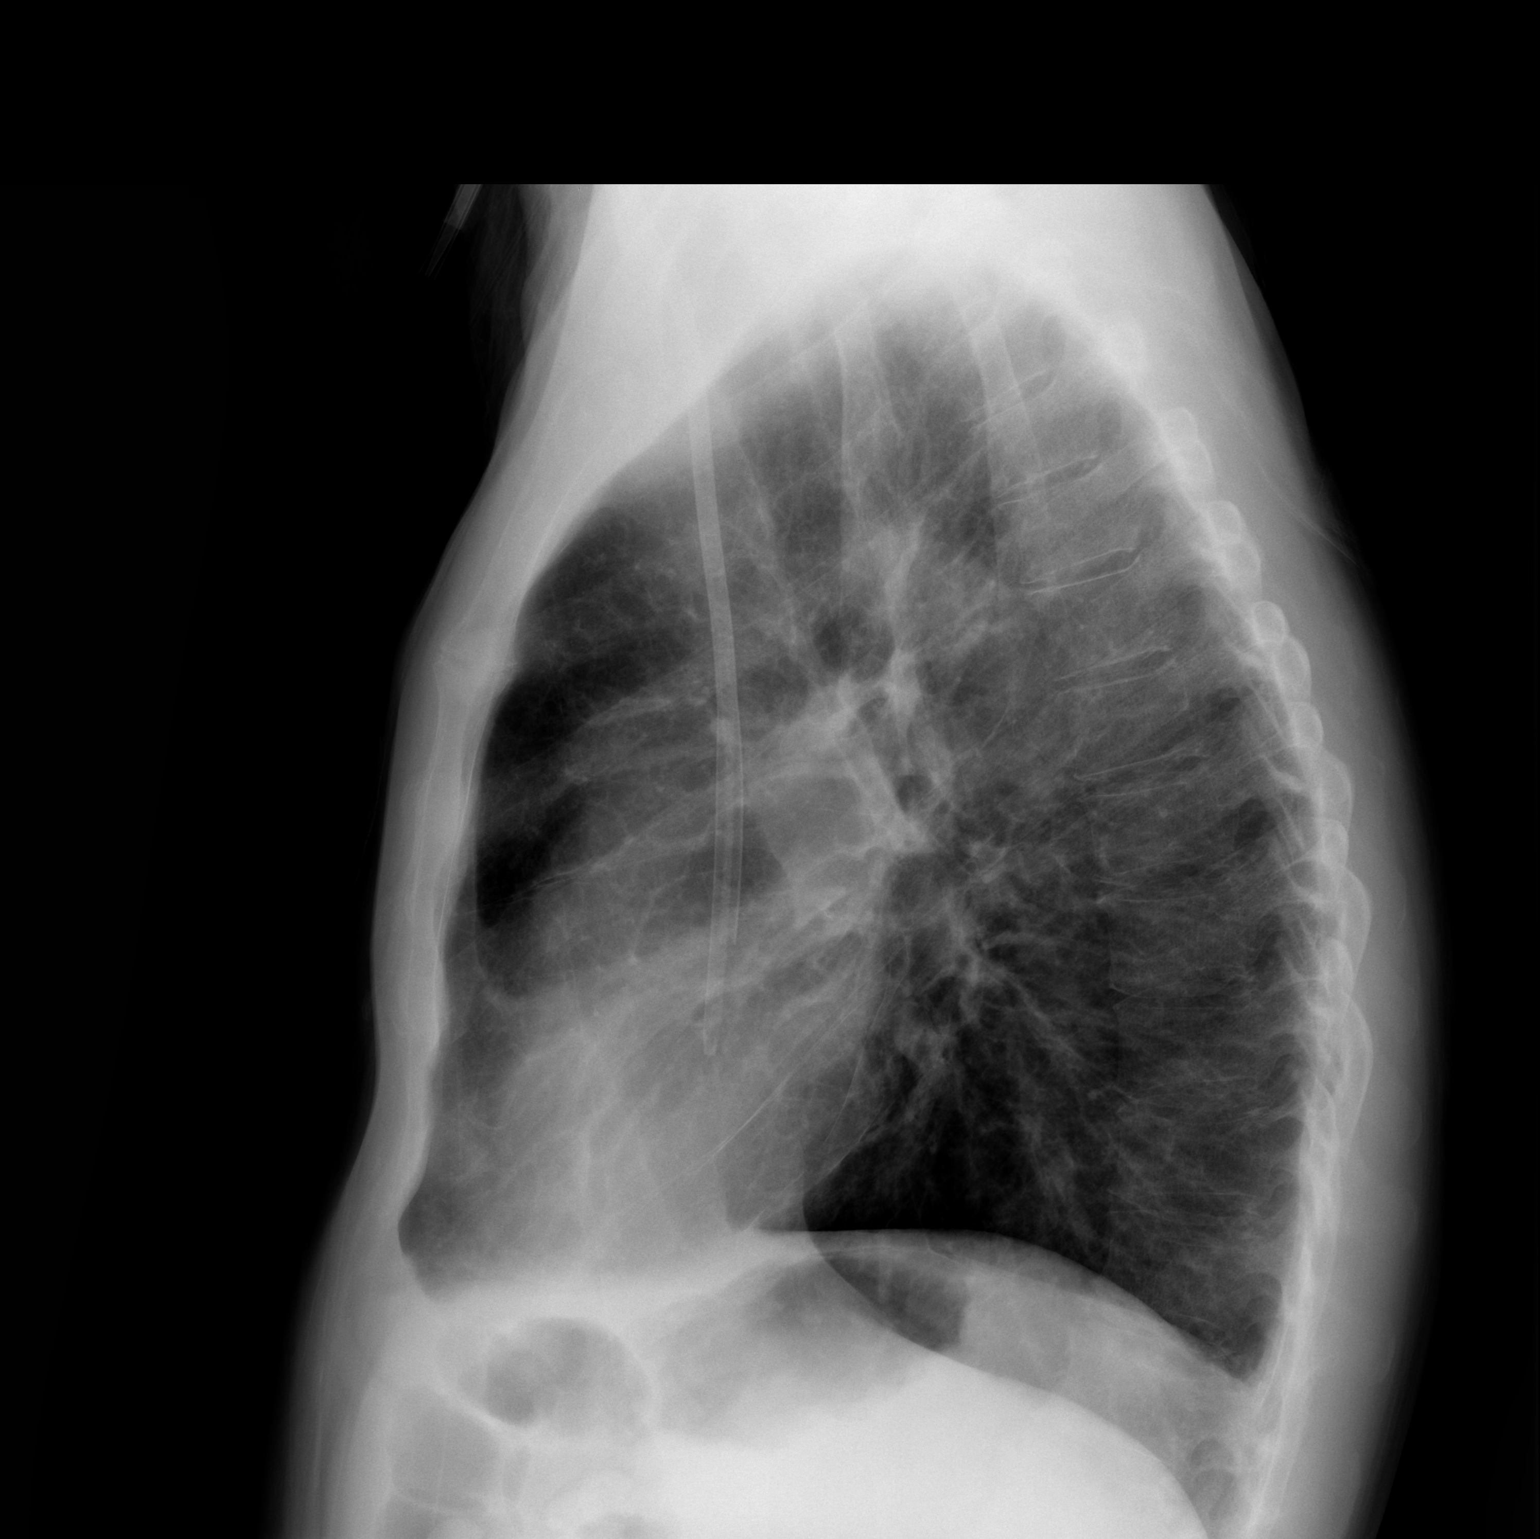

[2 of 2 positions shown; findings below may reference images not displayed]

FINDINGS: Upper normal heart size.  Stable right internal jugular
tunneled dialysis catheter.  No pneumothorax and no pleural
effusion.  No edema or consolidation.
IMPRESSION: No active cardiopulmonary disease.

## 2012-03-11 LAB — RENAL FUNCTION PANEL
CO2: 30 mEq/L (ref 19–32)
Calcium: 10.1 mg/dL (ref 8.4–10.5)
Chloride: 95 mEq/L — ABNORMAL LOW (ref 96–112)
GFR calc Af Amer: 11 mL/min — ABNORMAL LOW (ref >90)
Glucose, Bld: 139 mg/dL — ABNORMAL HIGH (ref 70–99)
Sodium: 138 mEq/L (ref 135–145)

## 2012-03-11 LAB — CBC
Hemoglobin: 7.6 g/dL — ABNORMAL LOW (ref 13.0–17.0)
MCH: 28.9 pg (ref 26.0–34.0)
RBC: 2.63 MIL/uL — ABNORMAL LOW (ref 4.22–5.81)

## 2012-03-11 LAB — CLOSTRIDIUM DIFFICILE BY PCR: Toxigenic C. Difficile by PCR: NEGATIVE

## 2012-03-12 LAB — CBC
Hemoglobin: 9.6 g/dL — ABNORMAL LOW (ref 13.0–17.0)
MCH: 28.8 pg (ref 26.0–34.0)
RBC: 3.33 MIL/uL — ABNORMAL LOW (ref 4.22–5.81)
WBC: 5 10*3/uL (ref 4.0–10.5)

## 2012-03-12 LAB — TYPE AND SCREEN

## 2012-03-13 DIAGNOSIS — A419 Sepsis, unspecified organism: Secondary | ICD-10-CM

## 2012-03-13 DIAGNOSIS — A409 Streptococcal sepsis, unspecified: Secondary | ICD-10-CM

## 2012-03-13 DIAGNOSIS — B951 Streptococcus, group B, as the cause of diseases classified elsewhere: Secondary | ICD-10-CM

## 2012-03-13 LAB — CBC
HCT: 28 % — ABNORMAL LOW (ref 39.0–52.0)
Hemoglobin: 9 g/dL — ABNORMAL LOW (ref 13.0–17.0)
MCV: 89.5 fL (ref 78.0–100.0)
RBC: 3.13 MIL/uL — ABNORMAL LOW (ref 4.22–5.81)
WBC: 4.8 10*3/uL (ref 4.0–10.5)

## 2012-03-13 LAB — RENAL FUNCTION PANEL
BUN: 71 mg/dL — ABNORMAL HIGH (ref 6–23)
CO2: 28 mEq/L (ref 19–32)
Chloride: 98 mEq/L (ref 96–112)
Creatinine, Ser: 5.57 mg/dL — ABNORMAL HIGH (ref 0.50–1.35)
Glucose, Bld: 102 mg/dL — ABNORMAL HIGH (ref 70–99)
Potassium: 4.3 mEq/L (ref 3.5–5.1)

## 2012-03-13 NOTE — Progress Notes (Signed)
Patient: [name] DOB: 1955/10/08 Date of Admission: 02/21/2012            Pulmonary consult  Date of Consult: 03/13/2012 MD requesting consult:  [facility] Reason for consult:  Vent/trach management  HPI - 56 y.o. y/o male with a PMH of HTN, COPD, Hep C, polysubstance abuse, chronic recurrent pancreatitis, PUD and ESRD. Had PEA arrest on 6/24. Intubated, got emergent HD for hyperkalemia, also treated for shock (sepsis and acidosis). Found to have pseudomonas PNA & bacteremia, also strep agalactiae in blood and urine. Treated w/ antibiotics. Developed C diff on 6/26. Respiratory status marginal. Required re-intubation on 7/6, self extubated 7/10. Transferred to SDU 7/11 still had left sided atx. 7/12, back to ICU for worsening collapse on left and acute respiratory failure. 7/18 underwent percutaneous tracheostomy secondary to prolonged respiratory failure.  Tx [facility], HD and abx.  PCCM consulted to [facility] for vent mgmt.  & bleeding around Tstomy site.  EVENTS: 7/22 - remains on fent , versed 5mg /hr and fent patch.  Full support vent 7/25- bleeding trach site 7/26- ETT placed, trach removed, hemostasis after ligation 3 small artery at skin / fat level  SUBJECTIVE: t collar 16 hours  Vitals:  Vital signs reviewed. Abnormal values will appear under impression plan section.    chest X-ray No results found.    CBC  Lab 03/13/12 0500 03/12/12 0610 03/11/12 0545  HGB 9.0* 9.6* 7.6*  HCT 28.0* 29.6* 23.8*  WBC 4.8 5.0 5.5  PLT 214 230 255    BMET  Lab 03/13/12 0500 03/11/12 0545 03/09/12 0455  NA 140 138 134*  K 4.3 4.2 --  CL 98 95* 94*  CO2 28 30 29   GLUCOSE 102* 139* 102*  BUN 71* 81* 78*  CREATININE 5.57* 6.16* 6.50*  CALCIUM 10.2 10.1 9.5  MG -- -- --  PHOS 4.4 3.9 3.5    ABG    Component Value Date/Time   PHART 7.374 02/22/2012 0620   PCO2ART 42.8 02/22/2012 0620   PO2ART 81.2 02/22/2012 0620   HCO3 24.2* 02/22/2012 0620   TCO2 25.4  02/22/2012 0620   ACIDBASEDEF 0.2 02/22/2012 0620   O2SAT 95.5 02/22/2012 0620    EXAM: General: less agitated. But in restaints Neuro: awake, follows some simple commands, more cooperative CV: s1s2 rrr, + m  PULM: mild rhonchi GI: abd soft +bs, ngt with tube feeds and eats Extremities:  Warm and dry, no edema    IMPRESSION/ PLAN:  Acute on chronic resp failure r/t LLL pseudomonas aspiration PNA c/b underlying COPD and rib fx r/t CPR, Afib with RVR, agitated delirium and volume overload r/t ESRD.  S/p trach 7/18. TCx 16 hours on 8/2.  8/9 24/7 ATC.   Resolved trach arterial bleeders  PLAN -  -Continue TC for now 24/7 -Monitor trach for any complications. -Leave original silk sutures in place, unable to remove now. -pCXR for rt base noted, no change in plan -would like to see improvement in mental status before decannulation.    [name], NP-C [facility] Pgr: [phone] or [phone]  Cuffless trach, will cap over the weekend and consider decannulation if improves.  Patient seen and examined, agree with above note.  I dictated the care and orders written for this patient under my direction and tolerated.  Koren Bound, M.D. [phone]

## 2012-03-14 ENCOUNTER — Other Ambulatory Visit (HOSPITAL_COMMUNITY): Payer: Self-pay

## 2012-03-16 LAB — CBC
MCH: 28.5 pg (ref 26.0–34.0)
MCV: 88.8 fL (ref 78.0–100.0)
Platelets: 208 10*3/uL (ref 150–400)
RDW: 15.1 % (ref 11.5–15.5)

## 2012-03-16 LAB — RENAL FUNCTION PANEL
Albumin: 2.3 g/dL — ABNORMAL LOW (ref 3.5–5.2)
BUN: 97 mg/dL — ABNORMAL HIGH (ref 6–23)
Creatinine, Ser: 7.24 mg/dL — ABNORMAL HIGH (ref 0.50–1.35)
Phosphorus: 3.9 mg/dL (ref 2.3–4.6)
Potassium: 5.1 mEq/L (ref 3.5–5.1)

## 2012-03-16 NOTE — Progress Notes (Signed)
Patient: [name] DOB: 13-Apr-1956 Date of Admission: 02/21/2012            Pulmonary consult  Date of Consult: 03/16/2012 MD requesting consult:  [facility] Reason for consult:  Vent/trach management  HPI - 56 y.o. y/o male with a PMH of HTN, COPD, Hep C, polysubstance abuse, chronic recurrent pancreatitis, PUD and ESRD. Had PEA arrest on 6/24. Intubated, got emergent HD for hyperkalemia, also treated for shock (sepsis and acidosis). Found to have pseudomonas PNA & bacteremia, also strep agalactiae in blood and urine. Treated w/ antibiotics. Developed C diff on 6/26. Respiratory status marginal. Required re-intubation on 7/6, self extubated 7/10. Transferred to SDU 7/11 still had left sided atx. 7/12, back to ICU for worsening collapse on left and acute respiratory failure. 7/18 underwent percutaneous tracheostomy secondary to prolonged respiratory failure.  Tx [facility] for [facility], HD and abx.  PCCM consulted to [facility] for vent mgmt.  & bleeding around Tstomy site.  EVENTS: 7/22 - remains on fent , versed 5mg /hr and fent patch.  Full support vent 7/25- bleeding trach site 7/26- ETT placed, trach removed, hemostasis after ligation 3 small artery at skin / fat level.  SUBJECTIVE: t collar 24 hrs  Vitals:  Vital signs reviewed. Abnormal values will appear under impression plan section.   chest X-ray Ct Chest Wo Contrast  03/14/2012  *RADIOLOGY REPORT*  Clinical Data: COPD, hepatitis C, polysubstance abuse. Pancreatitis.  Right infiltrate.  CT CHEST WITHOUT CONTRAST  Technique:  Multidetector CT imaging of the chest was performed following the standard protocol without IV contrast.  Comparison: Chest radiograph 03/14/2012  Findings: Tracheostomy tube in good position in the upper trachea. NG tube extends to the stomach.  There are shotty axillary lymph nodes in the left and right measuring up to 10 mm short axis.  No supraclavicular lymphadenopathy.  Small mediastinal  paratracheal lymph nodes are not pathologic by size criteria.  There is trace pericardial effusion.  Esophagus wall is mildly thickened to 4 mm throughout its course.  There is a moderate sized layering right pleural effusion with associated passive atelectasis in the right lower lobe.  There is consolidation with air bronchograms in the left lower lobe suggesting atelectasis and potentially infection.  There is 3 mm nodule in the right upper lobe (image 26).  There is a 5 mm nodule right upper lobe (image 16).  There are larger branching nodules and  atelectasis in the right middle lobe (image 39.  Limited view of the upper abdomen unremarkable.  The limited view of the skeleton of the is unremarkable.  IMPRESSION:  1.  Moderate right pleural effusion with right lower lobe atelectasis. 2.  Left lower lobe consolidation with air bronchograms suggests a combination of atelectasis and pneumonia. 3.  Linear nodular pattern in the right middle lobe suggests infectious etiology.  4.  Small right upper lobe pulmonary nodules are indeterminate.  4. Overall this pulmonary infectious pattern is improved from the CT abdomen of 01/23/2012 which include the lung bases.  Original Report Authenticated By: Genevive Bi, M.D.   CBC  Lab 03/16/12 0553 03/13/12 0500 03/12/12 0610  HGB 8.4* 9.0* 9.6*  HCT 26.2* 28.0* 29.6*  WBC 4.7 4.8 5.0  PLT 208 214 230    BMET  Lab 03/16/12 0553 03/13/12 0500 03/11/12 0545  NA 132* 140 138  K 5.1 4.3 --  CL 91* 98 95*  CO2 31 28 30   GLUCOSE 117* 102* 139*  BUN 97* 71*  81*  CREATININE 7.24* 5.57* 6.16*  CALCIUM 10.3 10.2 10.1  MG -- -- --  PHOS 3.9 4.4 3.9    ABG    Component Value Date/Time   PHART 7.374 02/22/2012 0620   PCO2ART 42.8 02/22/2012 0620   PO2ART 81.2 02/22/2012 0620   HCO3 24.2* 02/22/2012 0620   TCO2 25.4 02/22/2012 0620   ACIDBASEDEF 0.2 02/22/2012 0620   O2SAT 95.5 02/22/2012 0620    EXAM: General: less agitated. But in restaints Neuro: awake,  follows some simple commands, more cooperative CV: s1s2 rrr, + m  PULM: mild rhonchi, decreased bs rt >lt base GI: abd soft +bs, ngt with tube feeds and eats Extremities:  Warm and dry, no edema   IMPRESSION/ PLAN:  Acute on chronic resp failure r/t LLL pseudomonas aspiration PNA c/b underlying COPD and rib fx r/t CPR, Afib with RVR, agitated delirium and volume overload r/t ESRD.  S/p trach 7/18. TCx 16 hours on 8/2.  8/9 24/7 ATC.   Resolved trach arterial bleeders  PLAN -  -Continue TC for now 24/7 -Monitor trach for any complications. -Leave original silk sutures in place, unable to remove now. -pCXR for rt base noted, no change in plan -would like to see improvement in mental status before decannulation.  -for IR thoracentesis post HD  [facility] Minor ACNP [facility] PCCM Pager [phone] till 3 pm If no answer page [phone] 03/16/2012, 10:33 AM  Patient examined.  Records reviewed.  Assessment and plan above is edited and discussed with NP.  [name], M.D., F.C.C.P. Pulmonary and Critical Care Medicine [facility] Cell: [phone] Pager: [phone]

## 2012-03-17 ENCOUNTER — Other Ambulatory Visit (HOSPITAL_COMMUNITY): Payer: Self-pay

## 2012-03-17 LAB — BODY FLUID CELL COUNT WITH DIFFERENTIAL
Neutrophil Count, Fluid: 21 % (ref 0–25)
Total Nucleated Cell Count, Fluid: 376 cu mm (ref 0–1000)

## 2012-03-17 LAB — AMYLASE, BODY FLUID: Amylase, Fluid: 58 U/L

## 2012-03-17 NOTE — Procedures (Signed)
Successful US guided right thoracentesis. Yielded of turbid blood tinged fluid. Pt tolerated procedure well. No immediate complications.  Specimen was sent for labs. CXR ordered.  Brayton El PA-C 03/17/2012 11:46 AM

## 2012-03-18 ENCOUNTER — Other Ambulatory Visit (HOSPITAL_COMMUNITY): Payer: Self-pay

## 2012-03-18 LAB — RENAL FUNCTION PANEL
BUN: 80 mg/dL — ABNORMAL HIGH (ref 6–23)
CO2: 31 mEq/L (ref 19–32)
Calcium: 9.9 mg/dL (ref 8.4–10.5)
GFR calc Af Amer: 10 mL/min — ABNORMAL LOW (ref >90)
Glucose, Bld: 116 mg/dL — ABNORMAL HIGH (ref 70–99)
Sodium: 132 mEq/L — ABNORMAL LOW (ref 135–145)

## 2012-03-18 LAB — CBC
HCT: 24.1 % — ABNORMAL LOW (ref 39.0–52.0)
Hemoglobin: 7.9 g/dL — ABNORMAL LOW (ref 13.0–17.0)
MCH: 28.9 pg (ref 26.0–34.0)
RBC: 2.73 MIL/uL — ABNORMAL LOW (ref 4.22–5.81)

## 2012-03-18 LAB — PATHOLOGIST SMEAR REVIEW

## 2012-03-20 LAB — RENAL FUNCTION PANEL
CO2: 31 mEq/L (ref 19–32)
GFR calc Af Amer: 12 mL/min — ABNORMAL LOW (ref >90)
Glucose, Bld: 116 mg/dL — ABNORMAL HIGH (ref 70–99)
Potassium: 4.5 mEq/L (ref 3.5–5.1)
Sodium: 140 mEq/L (ref 135–145)

## 2012-03-20 LAB — CBC
Hemoglobin: 7.8 g/dL — ABNORMAL LOW (ref 13.0–17.0)
MCH: 28.4 pg (ref 26.0–34.0)
RBC: 2.75 MIL/uL — ABNORMAL LOW (ref 4.22–5.81)

## 2012-03-20 LAB — BODY FLUID CULTURE: Gram Stain: NONE SEEN

## 2012-03-20 NOTE — Progress Notes (Signed)
Patient: [name] DOB: 1955/10/16 Date of Admission: 02/21/2012            Pulmonary consult  Date of Consult: 03/20/2012 MD requesting consult:  [facility] Reason for consult:  Vent/trach management  HPI - 56 y.o. y/o male with a PMH of HTN, COPD, Hep C, polysubstance abuse, chronic recurrent pancreatitis, PUD and ESRD. Had PEA arrest on 6/24. Intubated, got emergent HD for hyperkalemia, also treated for shock (sepsis and acidosis). Found to have pseudomonas PNA & bacteremia, also strep agalactiae in blood and urine. Treated w/ antibiotics. Developed C diff on 6/26. Respiratory status marginal. Required re-intubation on 7/6, self extubated 7/10. Transferred to SDU 7/11 still had left sided atx. 7/12, back to ICU for worsening collapse on left and acute respiratory failure. 7/18 underwent percutaneous tracheostomy secondary to prolonged respiratory failure.  Tx [facility] for [facility], HD and abx.  PCCM consulted to [facility] for vent mgmt.  & bleeding around Tstomy site.  EVENTS: 7/22 - remains on fent , versed 5mg /hr and fent patch.  Full support vent 7/25- bleeding trach site 7/26- ETT placed, trach removed, hemostasis after ligation 3 small artery at skin / fat level. 8/16 - weaning on ATC, mental status MUCH improved  SUBJECTIVE: t collar 24 hrs  Vitals:  Vital signs reviewed. Abnormal values will appear under impression plan section.   chest X-ray No results found. CBC  Lab 03/20/12 0630 03/18/12 0600 03/16/12 0553  HGB 7.8* 7.9* 8.4*  HCT 24.4* 24.1* 26.2*  WBC 3.9* 4.4 4.7  PLT 185 185 208    BMET  Lab 03/20/12 0630 03/18/12 0600 03/16/12 0553  NA 140 132* 132*  K 4.5 4.6 --  CL 98 90* 91*  CO2 31 31 31   GLUCOSE 116* 116* 117*  BUN 59* 80* 97*  CREATININE 5.36* 6.36* 7.24*  CALCIUM 9.7 9.9 10.3  MG -- -- --  PHOS 4.0 4.2 3.9    ABG    Component Value Date/Time   PHART 7.374 02/22/2012 0620   PCO2ART 42.8 02/22/2012 0620   PO2ART 81.2 02/22/2012  0620   HCO3 24.2* 02/22/2012 0620   TCO2 25.4 02/22/2012 0620   ACIDBASEDEF 0.2 02/22/2012 0620   O2SAT 95.5 02/22/2012 0620    EXAM: General: chronically ill in NAD Neuro: awake, follows some simple commands, more cooperative CV: s1s2 rrr, + m  PULM: mild rhonchi, decreased bs rt >lt base GI: abd soft +bs, eating well PO Extremities:  Warm and dry, no edema   IMPRESSION/ PLAN:  Acute on chronic resp failure r/t LLL pseudomonas aspiration PNA c/b underlying COPD and rib fx r/t CPR, Afib with RVR, agitated delirium and volume overload r/t ESRD.  S/p trach 7/18. TCx 16 hours on 8/2.  8/9 24/7 ATC.   Resolved trach arterial bleeders  PLAN -  -Continue TC for now 24/7 -Monitor trach for any complications. -Leave original silk sutures in place, unable to remove now. -if mental status continues to improve, consider decannulation  [name], NP-C [place] [facility] Pgr: [phone] or [phone]  Patient examined.  Records reviewed.  Case discussed with NP.  Assessment and plan edited as above.  [name], M.D., F.C.C.P. Pulmonary and Critical Care Medicine [facility] Cell: [phone] Pager: [phone]

## 2012-03-21 ENCOUNTER — Other Ambulatory Visit (HOSPITAL_COMMUNITY): Payer: Self-pay

## 2012-03-21 LAB — CBC WITH DIFFERENTIAL/PLATELET
Basophils Absolute: 0 10*3/uL (ref 0.0–0.1)
Basophils Relative: 0 % (ref 0–1)
Eosinophils Absolute: 0 10*3/uL (ref 0.0–0.7)
Eosinophils Relative: 0 % (ref 0–5)
MCH: 28.4 pg (ref 26.0–34.0)
MCHC: 31.6 g/dL (ref 30.0–36.0)
Neutrophils Relative %: 70 % (ref 43–77)
Platelets: 189 10*3/uL (ref 150–400)
RBC: 3.03 MIL/uL — ABNORMAL LOW (ref 4.22–5.81)
RDW: 14.6 % (ref 11.5–15.5)

## 2012-03-21 LAB — CBC
HCT: 27 % — ABNORMAL LOW (ref 39.0–52.0)
Hemoglobin: 8.6 g/dL — ABNORMAL LOW (ref 13.0–17.0)
MCV: 90.3 fL (ref 78.0–100.0)
Platelets: 211 10*3/uL (ref 150–400)
RBC: 2.99 MIL/uL — ABNORMAL LOW (ref 4.22–5.81)
WBC: 4 10*3/uL (ref 4.0–10.5)

## 2012-03-21 LAB — BASIC METABOLIC PANEL
CO2: 33 mEq/L — ABNORMAL HIGH (ref 19–32)
Chloride: 94 mEq/L — ABNORMAL LOW (ref 96–112)
Sodium: 136 mEq/L (ref 135–145)

## 2012-03-21 LAB — SEDIMENTATION RATE: Sed Rate: 139 mm/hr — ABNORMAL HIGH (ref 0–16)

## 2012-03-21 LAB — CARDIAC PANEL(CRET KIN+CKTOT+MB+TROPI): CK, MB: 1.4 ng/mL (ref 0.3–4.0)

## 2012-03-22 LAB — HIGH SENSITIVITY CRP: CRP, High Sensitivity: 122.5 mg/L — ABNORMAL HIGH

## 2012-03-22 IMAGING — CR DG CHEST 1V PORT SAME DAY
1 series · 1 of 1 positions shown · non-contrast
Comparison: Two-view chest x-ray 09/03/2011.

CLINICAL DATA: The respiratory distress.  Right neck pain following
removal of dialysis catheter.

PORTABLE CHEST - 1 VIEW SAME DAY

[AP]
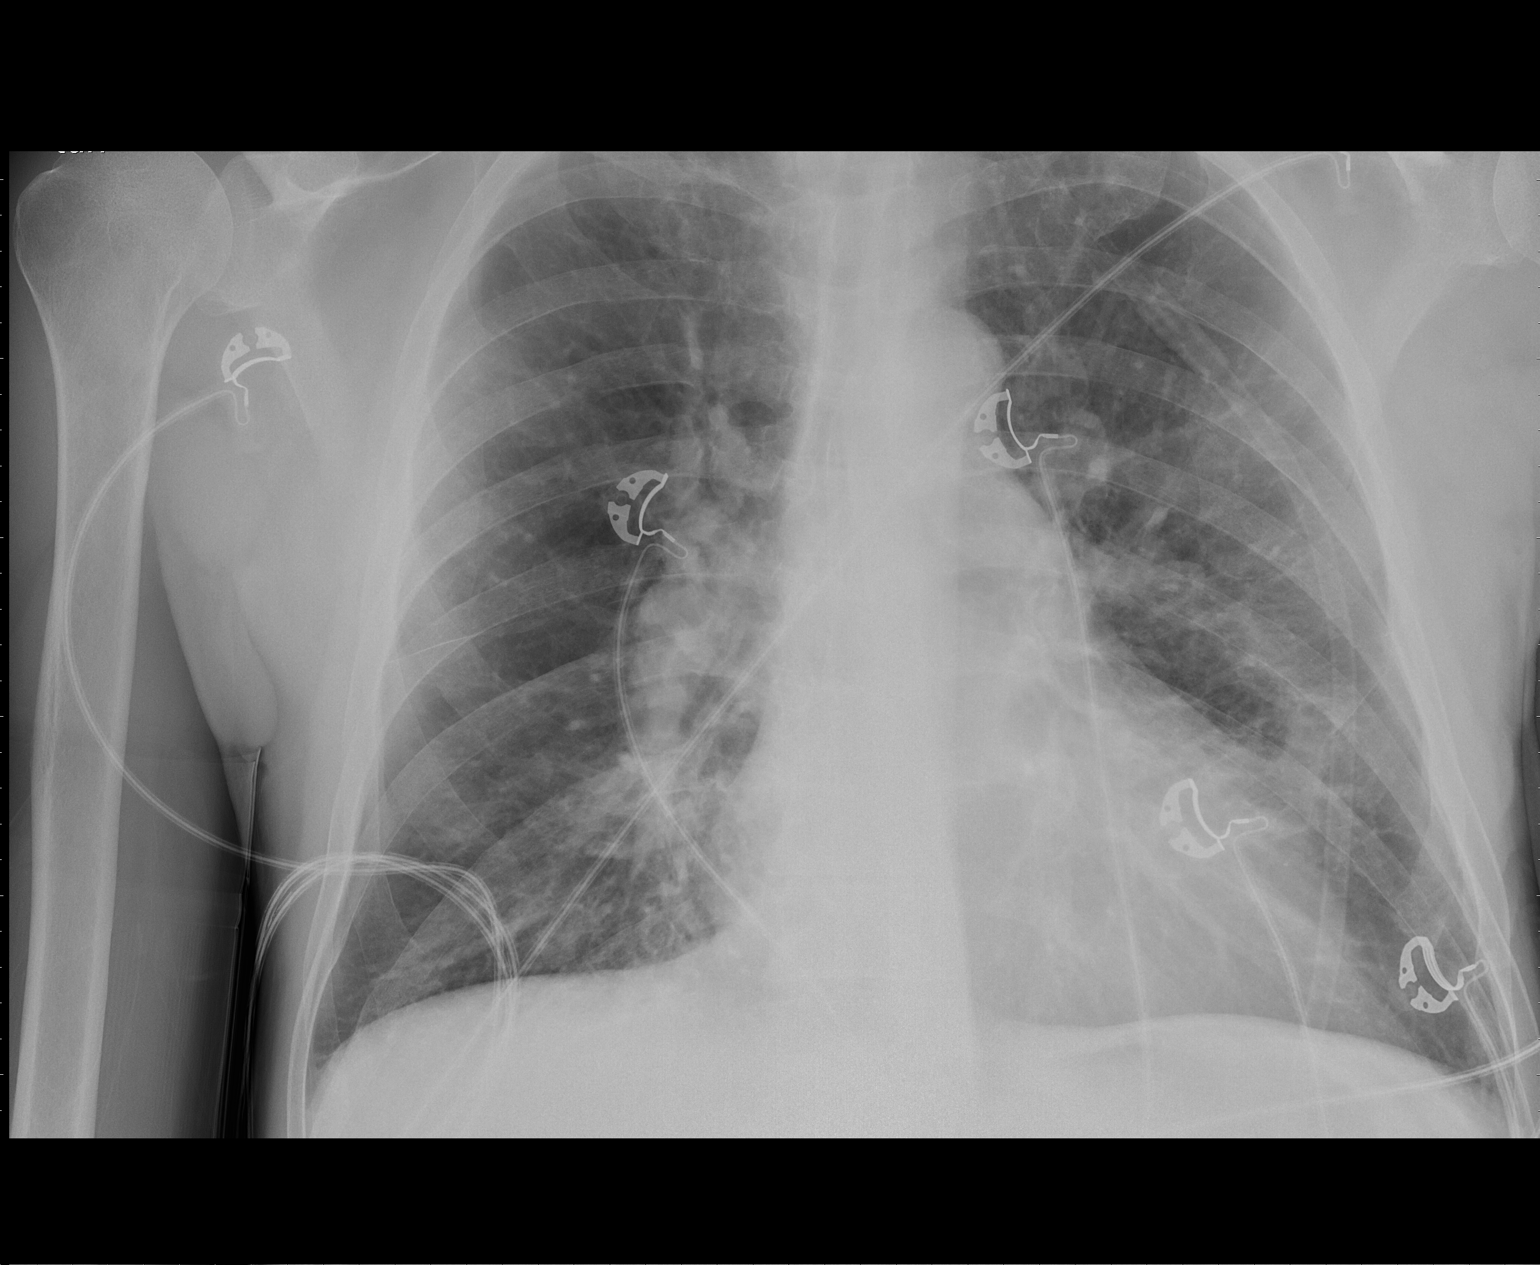

[1 of 1 positions shown; findings below may reference images not displayed]

FINDINGS: The heart is mildly enlarged.  Mild pulmonary vascular
congestion is superimposed on baseline of COPD.  Mild bibasilar
atelectasis is present.
IMPRESSION: 1.  Borderline cardiomegaly and mild pulmonary vascular congestion.

## 2012-03-23 LAB — CBC WITH DIFFERENTIAL/PLATELET
Basophils Absolute: 0 10*3/uL (ref 0.0–0.1)
Basophils Relative: 1 % (ref 0–1)
Eosinophils Absolute: 0.1 10*3/uL (ref 0.0–0.7)
HCT: 23.5 % — ABNORMAL LOW (ref 39.0–52.0)
Hemoglobin: 7.4 g/dL — ABNORMAL LOW (ref 13.0–17.0)
MCH: 28 pg (ref 26.0–34.0)
MCHC: 31.5 g/dL (ref 30.0–36.0)
Monocytes Absolute: 0.4 10*3/uL (ref 0.1–1.0)
Monocytes Relative: 11 % (ref 3–12)
Neutro Abs: 2.4 10*3/uL (ref 1.7–7.7)
Neutrophils Relative %: 61 % (ref 43–77)
RDW: 14.7 % (ref 11.5–15.5)

## 2012-03-23 LAB — CBC
MCH: 28.5 pg (ref 26.0–34.0)
MCHC: 31.6 g/dL (ref 30.0–36.0)
MCV: 90.2 fL (ref 78.0–100.0)
Platelets: 215 10*3/uL (ref 150–400)
WBC: 4.5 10*3/uL (ref 4.0–10.5)

## 2012-03-23 LAB — BASIC METABOLIC PANEL
BUN: 66 mg/dL — ABNORMAL HIGH (ref 6–23)
Chloride: 94 mEq/L — ABNORMAL LOW (ref 96–112)
Creatinine, Ser: 7.63 mg/dL — ABNORMAL HIGH (ref 0.50–1.35)
GFR calc Af Amer: 8 mL/min — ABNORMAL LOW (ref >90)
GFR calc non Af Amer: 7 mL/min — ABNORMAL LOW (ref >90)

## 2012-03-23 LAB — RENAL FUNCTION PANEL
BUN: 64 mg/dL — ABNORMAL HIGH (ref 6–23)
CO2: 30 mEq/L (ref 19–32)
Calcium: 9.8 mg/dL (ref 8.4–10.5)
Chloride: 94 mEq/L — ABNORMAL LOW (ref 96–112)
Creatinine, Ser: 7.23 mg/dL — ABNORMAL HIGH (ref 0.50–1.35)
GFR calc Af Amer: 9 mL/min — ABNORMAL LOW (ref >90)
GFR calc non Af Amer: 7 mL/min — ABNORMAL LOW (ref >90)
Phosphorus: 5.4 mg/dL — ABNORMAL HIGH (ref 2.3–4.6)
Sodium: 135 mEq/L (ref 135–145)

## 2012-03-23 LAB — CULTURE, RESPIRATORY W GRAM STAIN

## 2012-03-23 LAB — ALBUMIN: Albumin: 2.3 g/dL — ABNORMAL LOW (ref 3.5–5.2)

## 2012-03-23 NOTE — Progress Notes (Signed)
Patient: [name] DOB: [birth year] Date of Admission: 02/21/2012            Pulmonary consult  Date of Consult: 03/16/2012 MD requesting consult:  [facility] MD Reason for consult:  Vent/trach management  HPI - 56 y.o. y/o male with a PMH of HTN, COPD, Hep C, polysubstance abuse, chronic recurrent pancreatitis, PUD and ESRD. Had PEA arrest on 6/24. Intubated, got emergent HD for hyperkalemia, also treated for shock (sepsis and acidosis). Found to have pseudomonas PNA & bacteremia, also strep agalactiae in blood and urine. Treated w/ antibiotics. Developed C diff on 6/26. Respiratory status marginal. Required re-intubation on 7/6, self extubated 7/10. Transferred to SDU 7/11 still had left sided atx. 7/12, back to ICU for worsening collapse on left and acute respiratory failure. 7/18 underwent percutaneous tracheostomy secondary to prolonged respiratory failure.  Tx [facility] for [facility] we[facility], HD and abx.  PCCM consulted to [facility] for vent mgmt.  & bleeding around Tstomy site.  EVENTS: 7/22 - remains on fent , versed 5mg /hr and fent patch.  Full support vent 7/25- bleeding trach site 7/26- ETT placed, trach removed, hemostasis after ligation 3 small artery at skin / fat level. 8/16 - weaning on ATC, mental status MUCH improved  SUBJECTIVE: t collar and not needing vent.  No further bleeding from trach.  Vitals:  Vital signs reviewed. Abnormal values will appear under impression plan section.   chest X-ray 8/17: mild infiltrate RLL   CBC  Lab 03/23/12 0635 03/21/12 1517 03/21/12 0612  HGB 7.3* 8.6* 8.6*  HCT 23.1* 27.2* 27.0*  WBC 4.5 4.6 4.0  PLT 215 189 211    BMET  Lab 03/23/12 0635 03/21/12 0612 03/20/12 0630 03/18/12 0600  NA 135 136 140 132*  K 5.3* 4.6 -- --  CL 94* 94* 98 90*  CO2 30 33* 31 31  GLUCOSE 106* 126* 116* 116*  BUN 64* 33* 59* 80*  CREATININE 7.23* 3.98* 5.36* 6.36*  CALCIUM 9.8 10.4 9.7 9.9  MG -- -- -- --  PHOS 5.4* -- 4.0 4.2    ABG      Component Value Date/Time   PHART 7.374 02/22/2012 0620   PCO2ART 42.8 02/22/2012 0620   PO2ART 81.2 02/22/2012 0620   HCO3 24.2* 02/22/2012 0620   TCO2 25.4 02/22/2012 0620   ACIDBASEDEF 0.2 02/22/2012 0620   O2SAT 95.5 02/22/2012 0620    EXAM: General: chronically ill in NAD  Trach in place with PMV Neuro: awake, follows some simple commands, more cooperative CV: s1s2 rrr, + m  PULM: mild rhonchi, decreased bs rt >lt base GI: abd soft +bs, eating well PO Extremities:  Warm and dry, no edema   IMPRESSION/ PLAN:  Acute on chronic resp failure r/t LLL pseudomonas aspiration PNA c/b underlying COPD and rib fx r/t CPR, Afib with RVR, agitated delirium and volume overload r/t ESRD.  S/p trach 7/18.   8/9 24/7 ATC.   Resolved trach arterial bleeders  PLAN -  -Continue TC for now 24/7 -start decannulation protocol and get trach out  Shan Levans Beeper  [phone]  Cell  [phone]  If no response or cell goes to voicemail, call beeper [phone]

## 2012-03-24 ENCOUNTER — Other Ambulatory Visit (HOSPITAL_COMMUNITY): Payer: Self-pay

## 2012-03-24 LAB — BASIC METABOLIC PANEL
BUN: 34 mg/dL — ABNORMAL HIGH (ref 6–23)
CO2: 33 mEq/L — ABNORMAL HIGH (ref 19–32)
Calcium: 9.7 mg/dL (ref 8.4–10.5)
Creatinine, Ser: 4.64 mg/dL — ABNORMAL HIGH (ref 0.50–1.35)
GFR calc non Af Amer: 13 mL/min — ABNORMAL LOW (ref >90)
Glucose, Bld: 94 mg/dL (ref 70–99)

## 2012-03-24 LAB — CULTURE, BLOOD (ROUTINE X 2)

## 2012-03-25 LAB — CBC WITH DIFFERENTIAL/PLATELET
Basophils Absolute: 0 10*3/uL (ref 0.0–0.1)
Eosinophils Absolute: 0.2 10*3/uL (ref 0.0–0.7)
Eosinophils Relative: 6 % — ABNORMAL HIGH (ref 0–5)
HCT: 29.1 % — ABNORMAL LOW (ref 39.0–52.0)
Lymphocytes Relative: 38 % (ref 12–46)
MCH: 26.9 pg (ref 26.0–34.0)
MCHC: 29.9 g/dL — ABNORMAL LOW (ref 30.0–36.0)
MCV: 90.1 fL (ref 78.0–100.0)
Monocytes Absolute: 0.3 10*3/uL (ref 0.1–1.0)
Platelets: 208 10*3/uL (ref 150–400)
RDW: 14.6 % (ref 11.5–15.5)
WBC: 3.6 10*3/uL — ABNORMAL LOW (ref 4.0–10.5)

## 2012-03-25 LAB — RENAL FUNCTION PANEL
CO2: 32 mEq/L (ref 19–32)
Calcium: 10 mg/dL (ref 8.4–10.5)
Creatinine, Ser: 6.35 mg/dL — ABNORMAL HIGH (ref 0.50–1.35)
GFR calc non Af Amer: 9 mL/min — ABNORMAL LOW (ref >90)
Phosphorus: 6.3 mg/dL — ABNORMAL HIGH (ref 2.3–4.6)

## 2012-03-25 IMAGING — CR DG RIBS W/ CHEST 3+V*R*
3 series · 3 of 3 positions shown · non-contrast
Comparison: 09/15/2011

CLINICAL DATA: Fall.  Right chest pain

RIGHT RIBS AND CHEST - 3+ VIEW

[w ribs ap/pa upper right *]
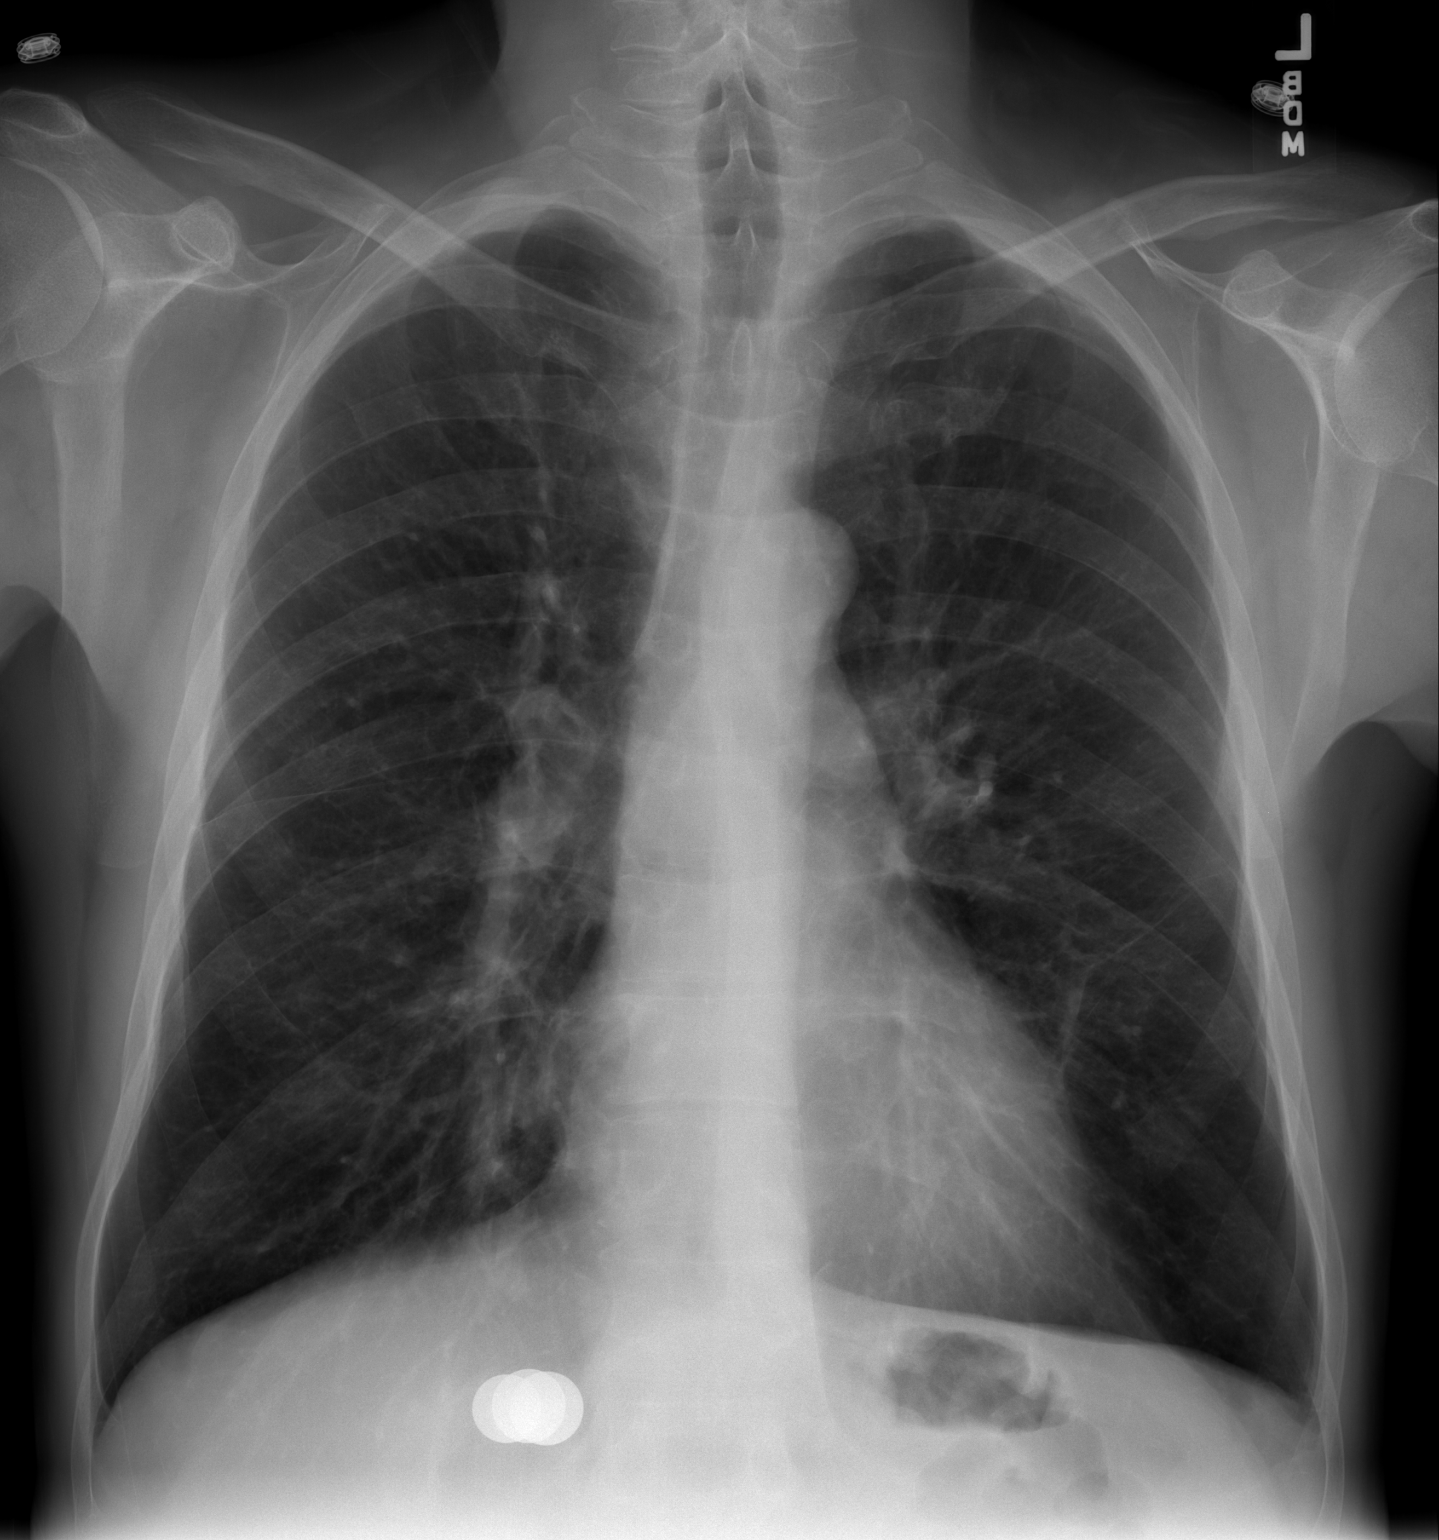

[w ribs ap/pa upper right]
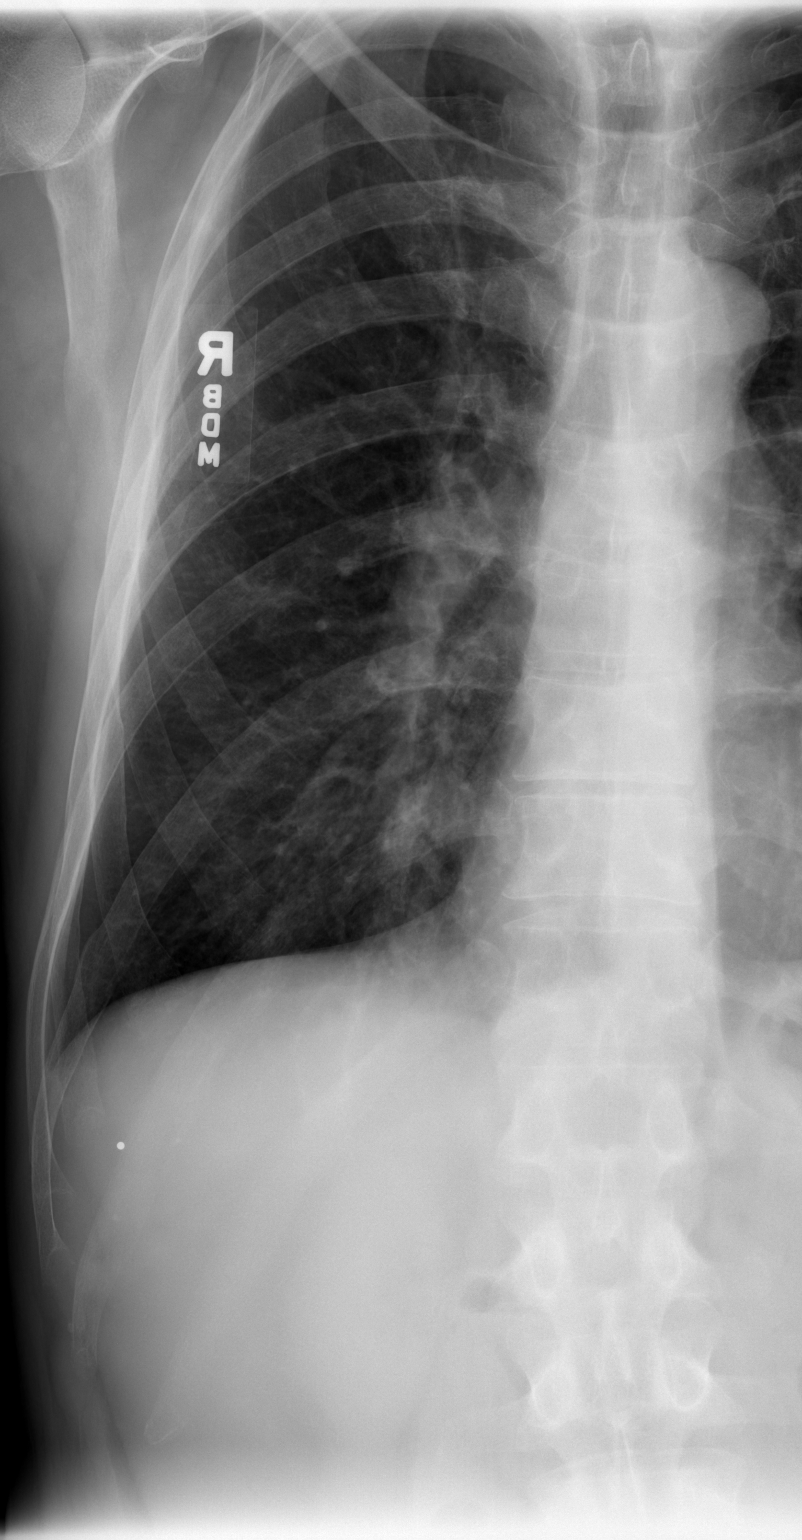

[w ribs oblique right]
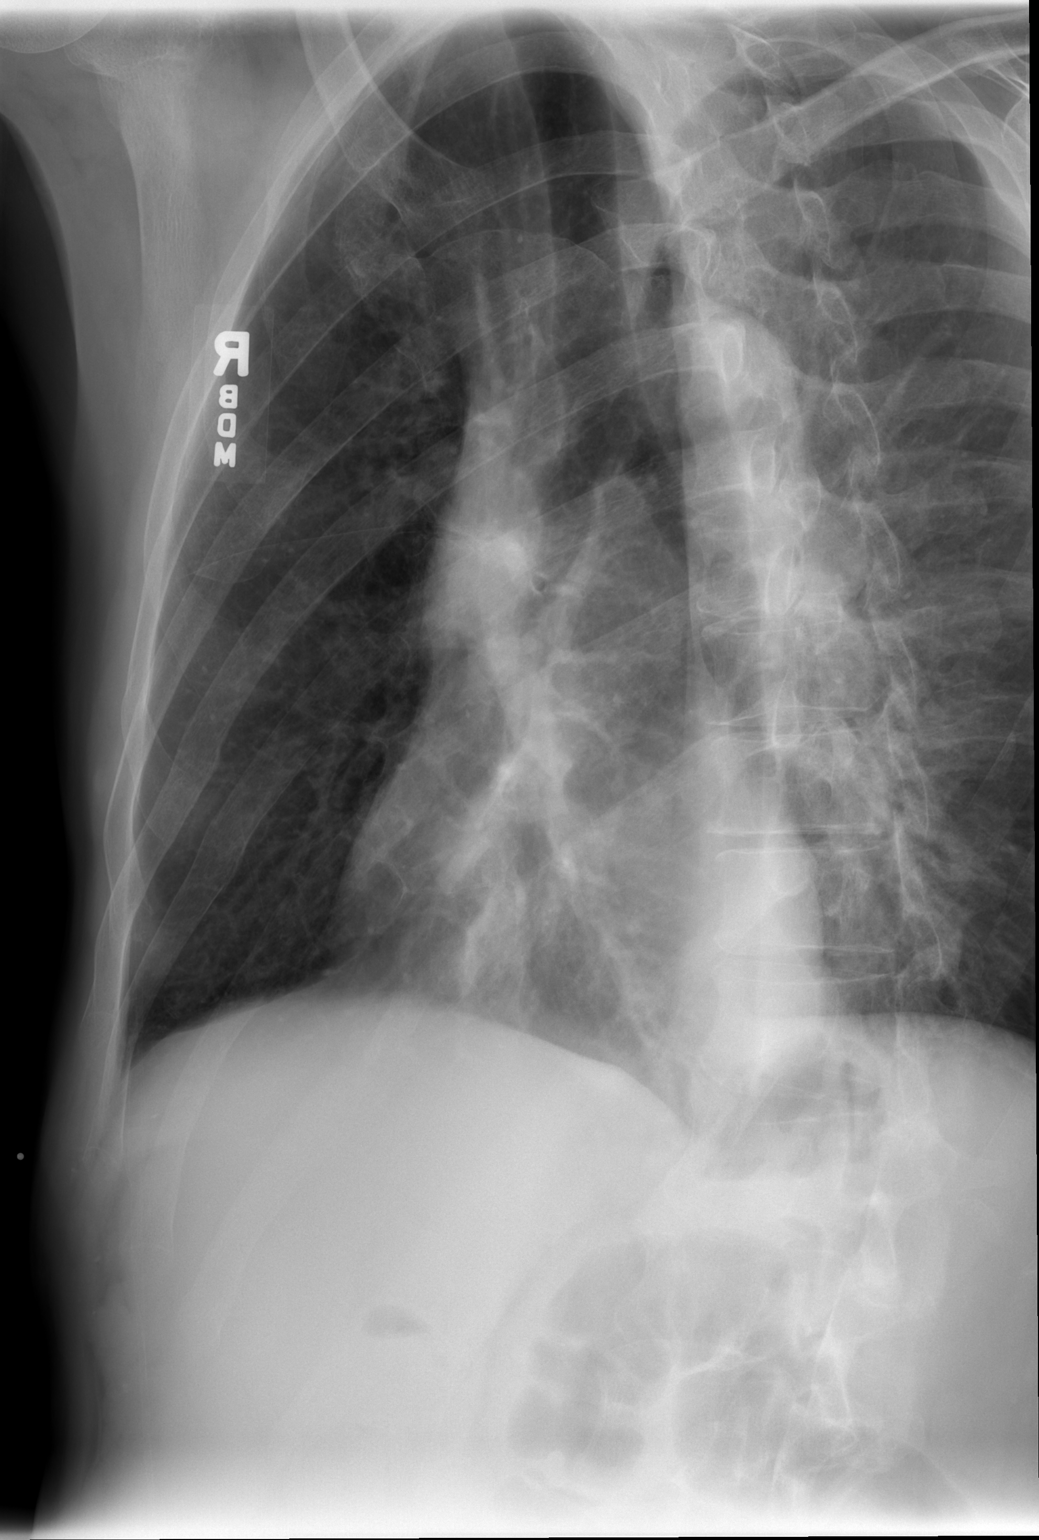

[3 of 3 positions shown; findings below may reference images not displayed]

FINDINGS: COPD with hyperinflation of the lungs.  Lungs are clear
without infiltrate or effusion.  No pneumothorax.

Right rib detail reveals no rib fracture.
IMPRESSION: COPD.  No acute abnormality.  Negative for right rib fracture.

## 2012-03-25 NOTE — Progress Notes (Signed)
Patient: [name] DOB: [birth year] Date of Admission: 02/21/2012            Pulmonary consult  Date of Consult: 03/16/2012 MD requesting consult:  [facility] Reason for consult:  Vent/trach management  HPI - 56 y.o. y/o male with a PMH of HTN, COPD, Hep C, polysubstance abuse, chronic recurrent pancreatitis, PUD and ESRD. Had PEA arrest on 6/24. Intubated, got emergent HD for hyperkalemia, also treated for shock (sepsis and acidosis). Found to have pseudomonas PNA & bacteremia, also strep agalactiae in blood and urine. Treated w/ antibiotics. Developed C diff on 6/26. Respiratory status marginal. Required re-intubation on 7/6, self extubated 7/10. Transferred to SDU 7/11 still had left sided atx. 7/12, back to ICU for worsening collapse on left and acute respiratory failure. 7/18 underwent percutaneous tracheostomy secondary to prolonged respiratory failure.  Tx [facility] for [facility], HD and abx.  PCCM consulted to [facility] for vent mgmt.  & bleeding around Tstomy site.  EVENTS: 7/22 - remains on fent , versed 5mg /hr and fent patch.  Full support vent 7/25- bleeding trach site 7/26- ETT placed, trach removed, hemostasis after ligation 3 small artery at skin / fat level. 8/16 - weaning on ATC, mental status MUCH improved  SUBJECTIVE: t collar and not needing vent.  No further bleeding from trach. Tol downsize of trach  Vitals:  Vital signs reviewed. Abnormal values will appear under impression plan section.   chest X-ray 8/17: mild infiltrate RLL   CBC  Lab 03/25/12 0520 03/23/12 1152 03/23/12 0635  HGB 8.7* 7.4* 7.3*  HCT 29.1* 23.5* 23.1*  WBC 3.6* 3.9* 4.5  PLT 208 223 215    BMET  Lab 03/25/12 0520 03/24/12 0550 03/23/12 1152 03/23/12 0635 03/21/12 0612 03/20/12 0630  NA 137 138 136 135 136 --  K 5.4* 4.6 -- -- -- --  CL 94* 94* 94* 94* 94* --  CO2 32 33* 29 30 33* --  GLUCOSE 111* 94 167* 106* 126* --  BUN 53* 34* 66* 64* 33* --  CREATININE 6.35* 4.64*  7.63* 7.23* 3.98* --  CALCIUM 10.0 9.7 9.7 9.8 10.4 --  MG -- -- -- -- -- --  PHOS 6.3* -- 5.4* 5.4* -- 4.0    ABG    Component Value Date/Time   PHART 7.374 02/22/2012 0620   PCO2ART 42.8 02/22/2012 0620   PO2ART 81.2 02/22/2012 0620   HCO3 24.2* 02/22/2012 0620   TCO2 25.4 02/22/2012 0620   ACIDBASEDEF 0.2 02/22/2012 0620   O2SAT 95.5 02/22/2012 0620    EXAM: General: chronically ill in NAD  Trach in place with plug Neuro: awake, follows some simple commands, more cooperative CV: s1s2 rrr, + m  PULM: mild rhonchi, decreased bs rt >lt base GI: abd soft +bs, eating well PO Extremities:  Warm and dry, no edema   IMPRESSION/ PLAN:  Acute on chronic resp failure r/t LLL pseudomonas aspiration PNA c/b underlying COPD and rib fx r/t CPR, Afib with RVR, agitated delirium and volume overload r/t ESRD.  S/p trach 7/18.   8/9 24/7 ATC.   Resolved trach arterial bleeders  PLAN -  -d/c trach  Shan Levans Beeper  [phone]  Cell  [phone]  If no response or cell goes to voicemail, call beeper [phone]

## 2012-03-26 LAB — CBC WITH DIFFERENTIAL/PLATELET
Basophils Absolute: 0 10*3/uL (ref 0.0–0.1)
HCT: 21.4 % — ABNORMAL LOW (ref 39.0–52.0)
Hemoglobin: 6.7 g/dL — CL (ref 13.0–17.0)
Lymphocytes Relative: 29 % (ref 12–46)
Lymphs Abs: 1.1 10*3/uL (ref 0.7–4.0)
Monocytes Absolute: 0.5 10*3/uL (ref 0.1–1.0)
Monocytes Relative: 13 % — ABNORMAL HIGH (ref 3–12)
Neutro Abs: 2.1 10*3/uL (ref 1.7–7.7)
RBC: 2.41 MIL/uL — ABNORMAL LOW (ref 4.22–5.81)
WBC: 3.9 10*3/uL — ABNORMAL LOW (ref 4.0–10.5)

## 2012-03-26 LAB — BASIC METABOLIC PANEL
BUN: 30 mg/dL — ABNORMAL HIGH (ref 6–23)
CO2: 33 mEq/L — ABNORMAL HIGH (ref 19–32)
Chloride: 95 mEq/L — ABNORMAL LOW (ref 96–112)
Creatinine, Ser: 4.06 mg/dL — ABNORMAL HIGH (ref 0.50–1.35)
Glucose, Bld: 105 mg/dL — ABNORMAL HIGH (ref 70–99)

## 2012-03-26 LAB — PREPARE RBC (CROSSMATCH)

## 2012-03-27 ENCOUNTER — Other Ambulatory Visit: Payer: Self-pay

## 2012-03-27 ENCOUNTER — Other Ambulatory Visit (HOSPITAL_COMMUNITY): Payer: Self-pay

## 2012-03-27 LAB — RENAL FUNCTION PANEL
Albumin: 2.2 g/dL — ABNORMAL LOW (ref 3.5–5.2)
GFR calc Af Amer: 11 mL/min — ABNORMAL LOW (ref >90)
Phosphorus: 5.1 mg/dL — ABNORMAL HIGH (ref 2.3–4.6)
Potassium: 5.8 mEq/L — ABNORMAL HIGH (ref 3.5–5.1)
Sodium: 136 mEq/L (ref 135–145)

## 2012-03-27 LAB — CBC
Hemoglobin: 7.4 g/dL — ABNORMAL LOW (ref 13.0–17.0)
MCHC: 31.6 g/dL (ref 30.0–36.0)
RDW: 14.9 % (ref 11.5–15.5)

## 2012-03-27 LAB — CARDIAC PANEL(CRET KIN+CKTOT+MB+TROPI)
CK, MB: 1.5 ng/mL (ref 0.3–4.0)
Relative Index: INVALID (ref 0.0–2.5)
Total CK: 19 U/L (ref 7–232)

## 2012-03-27 LAB — CBC WITH DIFFERENTIAL/PLATELET
Basophils Absolute: 0 10*3/uL (ref 0.0–0.1)
Lymphocytes Relative: 27 % (ref 12–46)
Neutro Abs: 2.8 10*3/uL (ref 1.7–7.7)
Platelets: 234 10*3/uL (ref 150–400)
RDW: 15.2 % (ref 11.5–15.5)
WBC: 4.7 10*3/uL (ref 4.0–10.5)

## 2012-03-27 LAB — CULTURE, BLOOD (ROUTINE X 2): Culture: NO GROWTH

## 2012-03-27 NOTE — Progress Notes (Signed)
Patient: [name] DOB: 22-Feb-1956 Date of Admission: 02/21/2012            Pulmonary consult  Date of Consult: 03/16/2012 MD requesting consult:  [facility] Reason for consult:  Vent/trach management  HPI - 56 y.o. y/o male with a PMH of HTN, COPD, Hep C, polysubstance abuse, chronic recurrent pancreatitis, PUD and ESRD. Had PEA arrest on 6/24. Intubated, got emergent HD for hyperkalemia, also treated for shock (sepsis and acidosis). Found to have pseudomonas PNA & bacteremia, also strep agalactiae in blood and urine. Treated w/ antibiotics. Developed C diff on 6/26. Respiratory status marginal. Required re-intubation on 7/6, self extubated 7/10. Transferred to SDU 7/11 still had left sided atx. 7/12, back to ICU for worsening collapse on left and acute respiratory failure. 7/18 underwent percutaneous tracheostomy secondary to prolonged respiratory failure.  Tx [facility] for [facility], HD and abx.  PCCM consulted to [facility] for vent mgmt.  & bleeding around Tstomy site.  EVENTS: 7/22 - remains on fent , versed 5mg /hr and fent patch.  Full support vent 7/25- bleeding trach site 7/26- ETT placed, trach removed, hemostasis after ligation 3 small artery at skin / fat level. 8/16 - weaning on ATC, mental status MUCH improved  SUBJECTIVE:  Vitals:  Vital signs reviewed sats 99% on room air chest X-ray 8/17: mild infiltrate RLL   CBC  Lab 03/27/12 0752 03/26/12 0533 03/25/12 0520  HGB 7.4* 6.7* 8.7*  HCT 23.4* 21.4* 29.1*  WBC 4.4 3.9* 3.6*  PLT 214 222 208    BMET  Lab 03/26/12 0533 03/25/12 0520 03/24/12 0550 03/23/12 1152 03/23/12 0635  NA 138 137 138 136 135  K 4.7 5.4* -- -- --  CL 95* 94* 94* 94* 94*  CO2 33* 32 33* 29 30  GLUCOSE 105* 111* 94 167* 106*  BUN 30* 53* 34* 66* 64*  CREATININE 4.06* 6.35* 4.64* 7.63* 7.23*  CALCIUM 9.7 10.0 9.7 9.7 9.8  MG -- -- -- -- --  PHOS -- 6.3* -- 5.4* 5.4*    ABG    Component Value Date/Time   PHART 7.374 02/22/2012  0620   PCO2ART 42.8 02/22/2012 0620   PO2ART 81.2 02/22/2012 0620   HCO3 24.2* 02/22/2012 0620   TCO2 25.4 02/22/2012 0620   ACIDBASEDEF 0.2 02/22/2012 0620   O2SAT 95.5 02/22/2012 0620    EXAM: General: chronically ill in NAD s/p removal of trach Neuro: awake, follows some simple commands, more cooperative CV: s1s2 rrr, + m  PULM: clear no accessory muscle use.  GI: abd soft +bs, eating well PO Extremities:  Warm and dry, no edema   IMPRESSION/ PLAN:  1) Acute on chronic resp failure r/t LLL pseudomonas aspiration PNA c/b underlying COPD and rib fx r/t CPR,  2) Afib with RVR, agitated delirium and volume overload r/t ESRD.   3) S/p trach 7/18.   8/9 24/7 ATC.   4) Resolved trach arterial bleeders. Tracheostomy removed on 8/22. Looks great.  PLAN -  Continue w/ rehab services.     PCCM will sign off  Call again prn  Shan Levans [facility]  [phone]  Cell  [phone]  If no response or cell goes to voicemail, call beeper [phone]

## 2012-03-28 LAB — CARDIAC PANEL(CRET KIN+CKTOT+MB+TROPI)
Relative Index: INVALID (ref 0.0–2.5)
Total CK: 18 U/L (ref 7–232)
Total CK: 19 U/L (ref 7–232)
Troponin I: <0.3 ng/mL (ref <0.30)

## 2012-03-28 LAB — TYPE AND SCREEN
ABO/RH(D): O NEG
Unit division: 0

## 2012-03-29 LAB — CLOSTRIDIUM DIFFICILE BY PCR: Toxigenic C. Difficile by PCR: NEGATIVE

## 2012-03-30 ENCOUNTER — Other Ambulatory Visit (HOSPITAL_COMMUNITY): Payer: Self-pay

## 2012-03-30 LAB — CBC
Hemoglobin: 7.9 g/dL — ABNORMAL LOW (ref 13.0–17.0)
MCHC: 32.4 g/dL (ref 30.0–36.0)
Platelets: 211 10*3/uL (ref 150–400)

## 2012-03-30 LAB — RENAL FUNCTION PANEL
Albumin: 2.1 g/dL — ABNORMAL LOW (ref 3.5–5.2)
GFR calc Af Amer: 19 mL/min — ABNORMAL LOW (ref >90)
GFR calc non Af Amer: 17 mL/min — ABNORMAL LOW (ref >90)
Glucose, Bld: 163 mg/dL — ABNORMAL HIGH (ref 70–99)
Phosphorus: 2.9 mg/dL (ref 2.3–4.6)
Potassium: 5 mEq/L (ref 3.5–5.1)
Sodium: 139 mEq/L (ref 135–145)

## 2012-03-31 LAB — CULTURE, BLOOD (ROUTINE X 2): Culture: NO GROWTH

## 2012-04-01 LAB — RENAL FUNCTION PANEL
Albumin: 2.2 g/dL — ABNORMAL LOW (ref 3.5–5.2)
BUN: 55 mg/dL — ABNORMAL HIGH (ref 6–23)
CO2: 28 mEq/L (ref 19–32)
Calcium: 10.4 mg/dL (ref 8.4–10.5)
Chloride: 96 mEq/L (ref 96–112)
Creatinine, Ser: 6.43 mg/dL — ABNORMAL HIGH (ref 0.50–1.35)
GFR calc non Af Amer: 9 mL/min — ABNORMAL LOW (ref >90)

## 2012-04-01 LAB — TRANSFERRIN: Transferrin: 175 mg/dL — ABNORMAL LOW (ref 200–360)

## 2012-04-01 LAB — CBC
MCHC: 30.7 g/dL (ref 30.0–36.0)
Platelets: 231 10*3/uL (ref 150–400)
RDW: 14.7 % (ref 11.5–15.5)
WBC: 3.5 10*3/uL — ABNORMAL LOW (ref 4.0–10.5)

## 2012-04-01 LAB — IRON AND TIBC
Iron: 41 ug/dL — ABNORMAL LOW (ref 42–135)
TIBC: 211 ug/dL — ABNORMAL LOW (ref 215–435)

## 2012-04-01 LAB — FERRITIN: Ferritin: 1227 ng/mL — ABNORMAL HIGH (ref 22–322)

## 2012-04-02 ENCOUNTER — Other Ambulatory Visit (HOSPITAL_COMMUNITY): Payer: Self-pay

## 2012-04-02 LAB — BASIC METABOLIC PANEL
BUN: 31 mg/dL — ABNORMAL HIGH (ref 6–23)
Calcium: 10.6 mg/dL — ABNORMAL HIGH (ref 8.4–10.5)
Creatinine, Ser: 4.52 mg/dL — ABNORMAL HIGH (ref 0.50–1.35)
GFR calc Af Amer: 15 mL/min — ABNORMAL LOW (ref >90)
GFR calc non Af Amer: 13 mL/min — ABNORMAL LOW (ref >90)
Glucose, Bld: 96 mg/dL (ref 70–99)

## 2012-04-02 LAB — HEMOGLOBIN AND HEMATOCRIT, BLOOD
HCT: 25.3 % — ABNORMAL LOW (ref 39.0–52.0)
Hemoglobin: 8.4 g/dL — ABNORMAL LOW (ref 13.0–17.0)

## 2012-04-03 LAB — CBC
MCH: 27.8 pg (ref 26.0–34.0)
MCHC: 31.6 g/dL (ref 30.0–36.0)
MCV: 87.8 fL (ref 78.0–100.0)
Platelets: 221 10*3/uL (ref 150–400)
RBC: 2.88 MIL/uL — ABNORMAL LOW (ref 4.22–5.81)
RDW: 14.8 % (ref 11.5–15.5)

## 2012-04-03 LAB — CULTURE, BLOOD (ROUTINE X 2): Culture: NO GROWTH

## 2012-04-03 LAB — RENAL FUNCTION PANEL
Albumin: 2.2 g/dL — ABNORMAL LOW (ref 3.5–5.2)
BUN: 44 mg/dL — ABNORMAL HIGH (ref 6–23)
Calcium: 10.3 mg/dL (ref 8.4–10.5)
Creatinine, Ser: 6.3 mg/dL — ABNORMAL HIGH (ref 0.50–1.35)
Phosphorus: 4.3 mg/dL (ref 2.3–4.6)

## 2012-04-06 LAB — TYPE AND SCREEN
ABO/RH(D): O NEG
Antibody Screen: NEGATIVE
Unit division: 0

## 2012-04-22 ENCOUNTER — Other Ambulatory Visit (HOSPITAL_COMMUNITY): Payer: Self-pay | Admitting: Nephrology

## 2012-04-22 DIAGNOSIS — N186 End stage renal disease: Secondary | ICD-10-CM

## 2012-04-23 ENCOUNTER — Encounter (HOSPITAL_COMMUNITY): Payer: Self-pay

## 2012-04-23 ENCOUNTER — Ambulatory Visit (HOSPITAL_COMMUNITY)
Admission: RE | Admit: 2012-04-23 | Discharge: 2012-04-23 | Disposition: A | Discharge status: Home / Self Care | Payer: Medicare Other | Source: Ambulatory Visit | Attending: Nephrology | Admitting: Nephrology

## 2012-04-23 ENCOUNTER — Other Ambulatory Visit (HOSPITAL_COMMUNITY): Payer: Self-pay | Admitting: Nephrology

## 2012-04-23 DIAGNOSIS — N186 End stage renal disease: Secondary | ICD-10-CM | POA: Insufficient documentation

## 2012-04-23 DIAGNOSIS — I120 Hypertensive chronic kidney disease with stage 5 chronic kidney disease or end stage renal disease: Secondary | ICD-10-CM | POA: Insufficient documentation

## 2012-04-23 DIAGNOSIS — F17200 Nicotine dependence, unspecified, uncomplicated: Secondary | ICD-10-CM | POA: Insufficient documentation

## 2012-04-23 DIAGNOSIS — J4489 Other specified chronic obstructive pulmonary disease: Secondary | ICD-10-CM | POA: Insufficient documentation

## 2012-04-23 DIAGNOSIS — I871 Compression of vein: Secondary | ICD-10-CM | POA: Insufficient documentation

## 2012-04-23 DIAGNOSIS — T82898A Other specified complication of vascular prosthetic devices, implants and grafts, initial encounter: Secondary | ICD-10-CM | POA: Insufficient documentation

## 2012-04-23 DIAGNOSIS — J449 Chronic obstructive pulmonary disease, unspecified: Secondary | ICD-10-CM | POA: Insufficient documentation

## 2012-04-23 DIAGNOSIS — Y832 Surgical operation with anastomosis, bypass or graft as the cause of abnormal reaction of the patient, or of later complication, without mention of misadventure at the time of the procedure: Secondary | ICD-10-CM | POA: Insufficient documentation

## 2012-04-23 MED ORDER — IOHEXOL 300 MG/ML  SOLN
100.0000 mL | Freq: Once | INTRAMUSCULAR | Status: AC | PRN
  Administered 2012-04-23: 50 mL via INTRAVENOUS

## 2012-04-23 MED ORDER — HEPARIN SODIUM (PORCINE) 1000 UNIT/ML IJ SOLN
INTRAMUSCULAR | Status: AC
  Filled 2012-04-23: qty 1

## 2012-04-23 MED ORDER — FENTANYL CITRATE 0.05 MG/ML IJ SOLN
INTRAMUSCULAR | Status: AC | PRN
  Administered 2012-04-23: 25 ug via INTRAVENOUS
  Administered 2012-04-23: 50 ug via INTRAVENOUS
  Administered 2012-04-23: 25 ug via INTRAVENOUS

## 2012-04-23 MED ORDER — MIDAZOLAM HCL 5 MG/5ML IJ SOLN
INTRAMUSCULAR | Status: AC | PRN
  Administered 2012-04-23: 2 mg via INTRAVENOUS

## 2012-04-23 MED ORDER — MIDAZOLAM HCL 2 MG/2ML IJ SOLN
INTRAMUSCULAR | Status: AC
  Filled 2012-04-23: qty 4

## 2012-04-23 MED ORDER — ALTEPLASE 100 MG IV SOLR
2.0000 mg | Freq: Once | INTRAVENOUS | Status: AC
  Administered 2012-04-23: 2 mg
  Filled 2012-04-23: qty 2

## 2012-04-23 MED ORDER — HEPARIN SODIUM (PORCINE) 1000 UNIT/ML IJ SOLN
INTRAMUSCULAR | Status: AC | PRN
  Administered 2012-04-23: 3000 [IU] via INTRAVENOUS

## 2012-04-23 MED ORDER — FENTANYL CITRATE 0.05 MG/ML IJ SOLN
INTRAMUSCULAR | Status: AC
  Filled 2012-04-23: qty 4

## 2012-04-23 NOTE — ED Notes (Signed)
TPA 2 ml ordered

## 2012-04-23 NOTE — Procedures (Signed)
LUE AVG thrombectomy PTA 7mm No comp 
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
## 2012-04-23 NOTE — ED Notes (Signed)
IV start attempt without success X 2 to right hand.  Pt tolerated well, both sites without redness or edema.  IV team paged to start.  IR informed of delay

## 2012-04-30 IMAGING — CR DG CHEST 2V
2 series · 2 of 2 positions shown · non-contrast
Comparison: [DATE] pain/13

CLINICAL DATA: Epigastric pain.  History of pancreatitis

CHEST - 2 VIEW

[w chest pa]
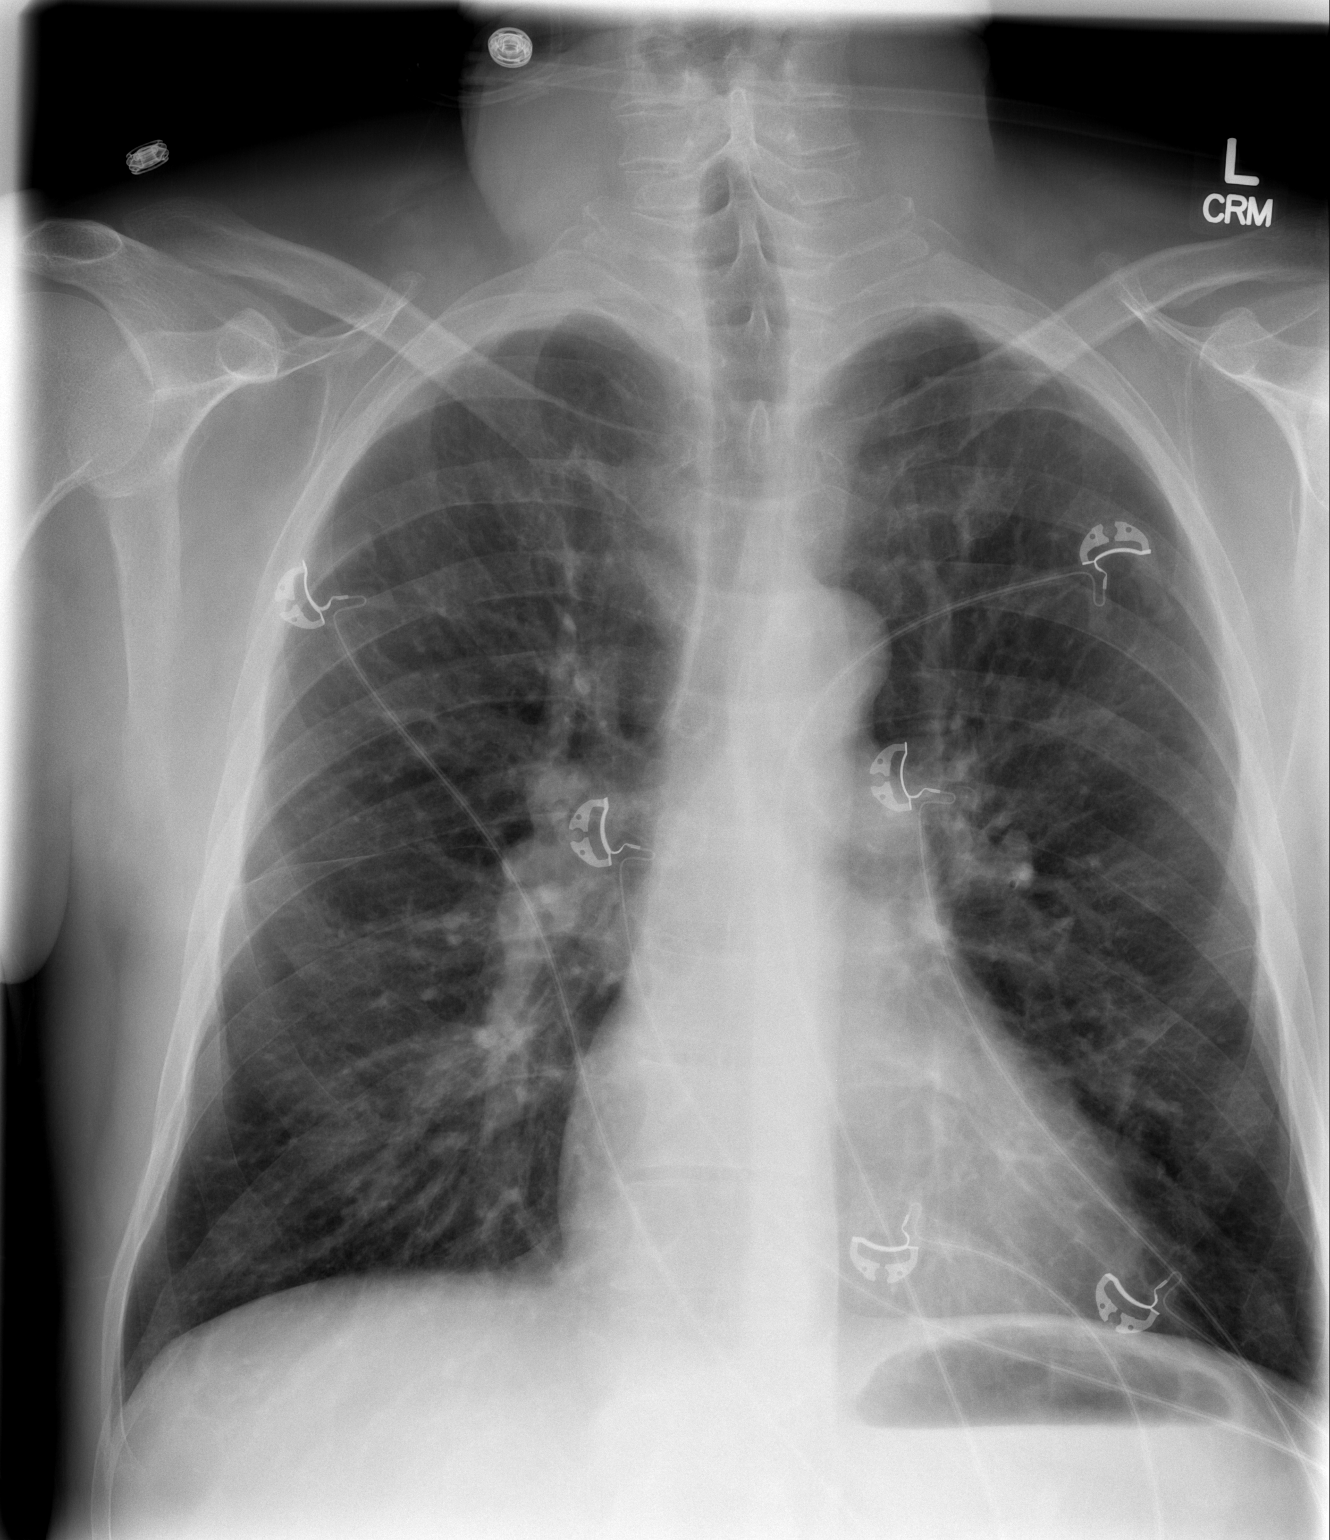

[w chest lat]
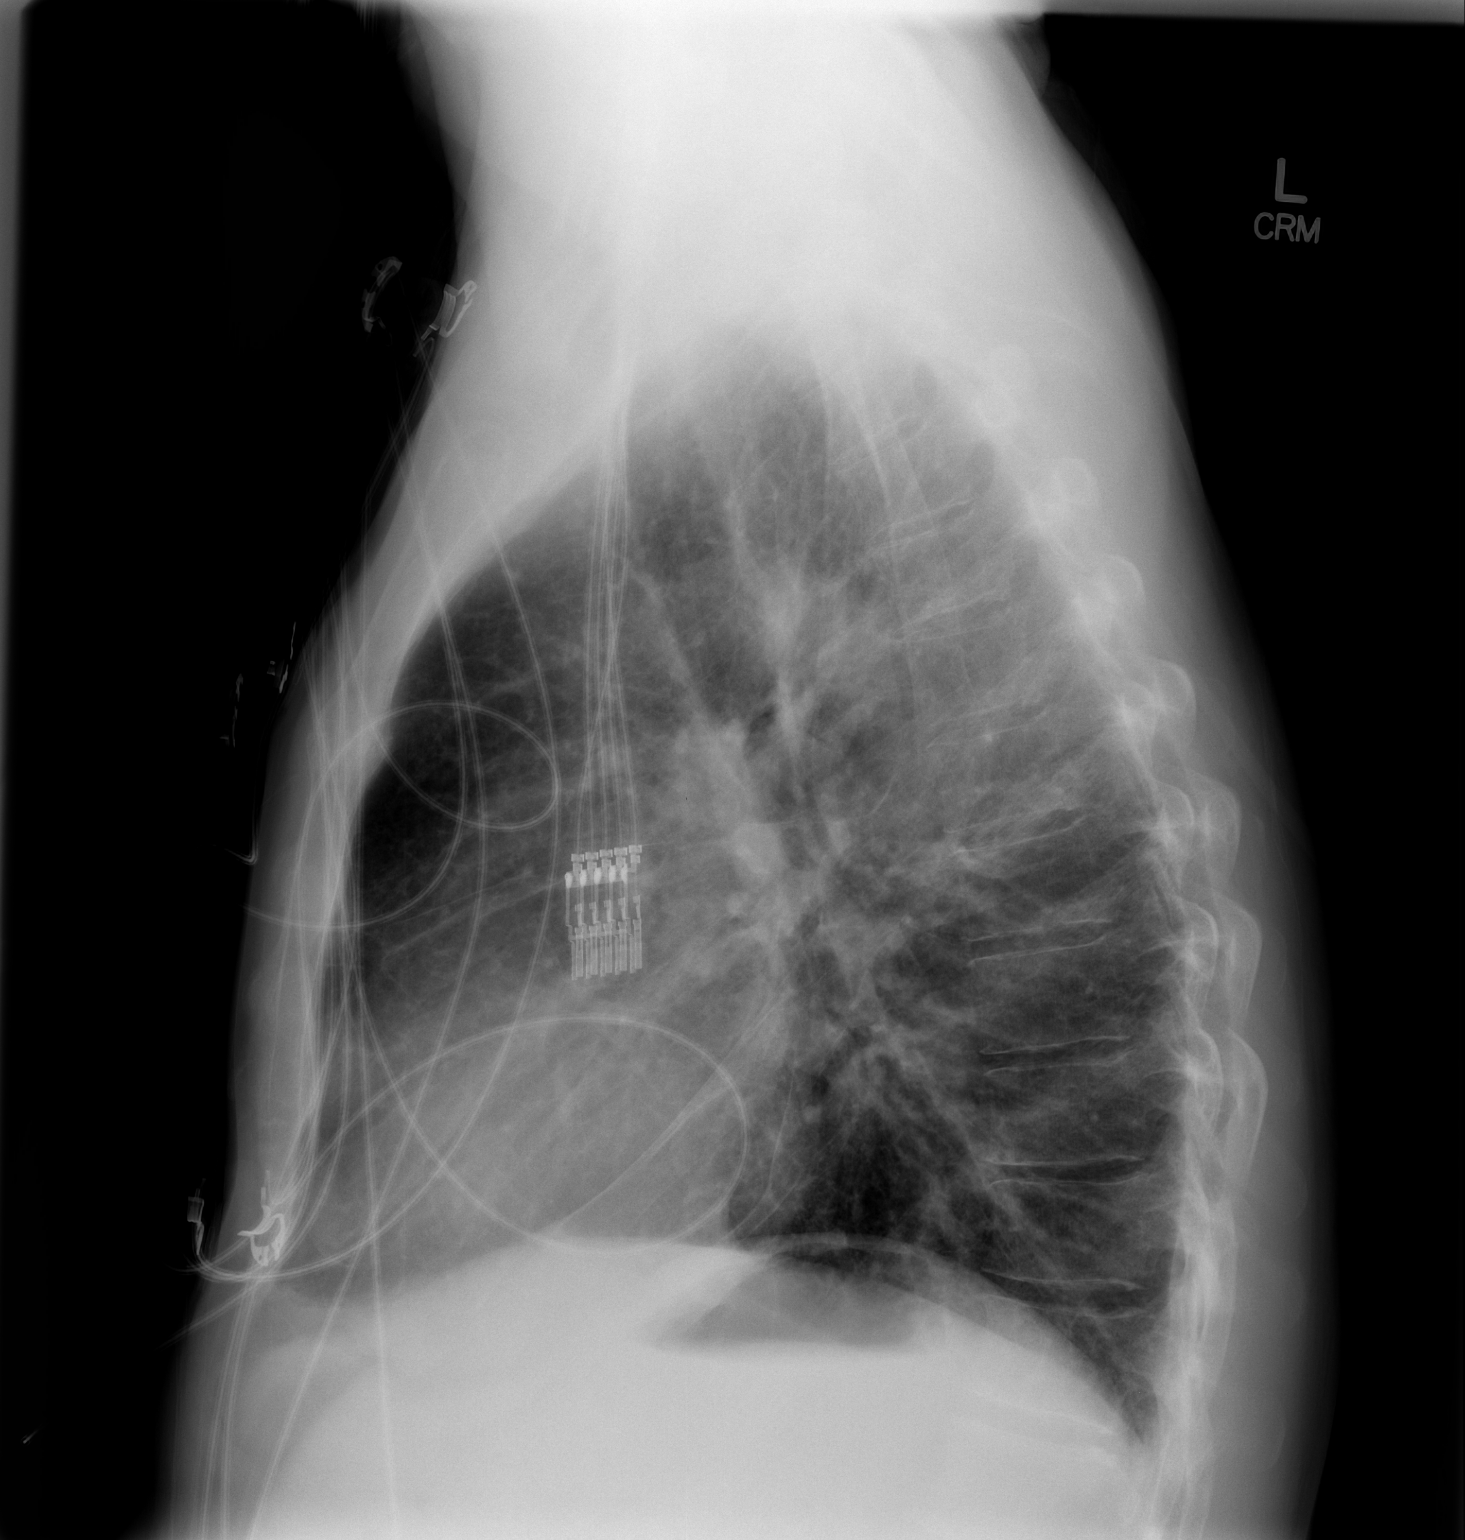

[2 of 2 positions shown; findings below may reference images not displayed]

FINDINGS: Heart size appears normal.

There is no pleural effusion or edema.

Increased lung volumes and coarsened interstitial markings are
noted similar to previous exam.

No focal airspace consolidation identified.
IMPRESSION: 1.  No acute cardiopulmonary abnormalities.
2.  Hyperinflation and coarsened interstitial markings suggests
COPD.

## 2012-05-01 ENCOUNTER — Other Ambulatory Visit (HOSPITAL_COMMUNITY): Payer: Self-pay | Admitting: Nephrology

## 2012-05-01 DIAGNOSIS — N186 End stage renal disease: Secondary | ICD-10-CM

## 2012-05-02 ENCOUNTER — Ambulatory Visit (HOSPITAL_COMMUNITY)
Admission: RE | Admit: 2012-05-02 | Discharge: 2012-05-02 | Disposition: A | Discharge status: Home / Self Care | Payer: Medicare Other | Source: Ambulatory Visit | Attending: Nephrology | Admitting: Nephrology

## 2012-05-02 ENCOUNTER — Other Ambulatory Visit (HOSPITAL_COMMUNITY): Payer: Self-pay | Admitting: Nephrology

## 2012-05-02 DIAGNOSIS — F1020 Alcohol dependence, uncomplicated: Secondary | ICD-10-CM | POA: Insufficient documentation

## 2012-05-02 DIAGNOSIS — Z992 Dependence on renal dialysis: Secondary | ICD-10-CM | POA: Insufficient documentation

## 2012-05-02 DIAGNOSIS — B1920 Unspecified viral hepatitis C without hepatic coma: Secondary | ICD-10-CM | POA: Insufficient documentation

## 2012-05-02 DIAGNOSIS — J449 Chronic obstructive pulmonary disease, unspecified: Secondary | ICD-10-CM | POA: Insufficient documentation

## 2012-05-02 DIAGNOSIS — F17200 Nicotine dependence, unspecified, uncomplicated: Secondary | ICD-10-CM | POA: Insufficient documentation

## 2012-05-02 DIAGNOSIS — D61818 Other pancytopenia: Secondary | ICD-10-CM | POA: Insufficient documentation

## 2012-05-02 DIAGNOSIS — T82898A Other specified complication of vascular prosthetic devices, implants and grafts, initial encounter: Secondary | ICD-10-CM | POA: Insufficient documentation

## 2012-05-02 DIAGNOSIS — J4489 Other specified chronic obstructive pulmonary disease: Secondary | ICD-10-CM | POA: Insufficient documentation

## 2012-05-02 DIAGNOSIS — N186 End stage renal disease: Secondary | ICD-10-CM

## 2012-05-02 DIAGNOSIS — Y849 Medical procedure, unspecified as the cause of abnormal reaction of the patient, or of later complication, without mention of misadventure at the time of the procedure: Secondary | ICD-10-CM | POA: Insufficient documentation

## 2012-05-02 DIAGNOSIS — I120 Hypertensive chronic kidney disease with stage 5 chronic kidney disease or end stage renal disease: Secondary | ICD-10-CM | POA: Insufficient documentation

## 2012-05-02 IMAGING — CR DG SHOULDER 2+V PORT*R*
3 series · 3 of 3 positions shown · non-contrast
Comparison: None.

CLINICAL DATA: Right shoulder pain.  No known injury.

PORTABLE RIGHT SHOULDER - 2+ VIEW

[AP (1 of 3)]
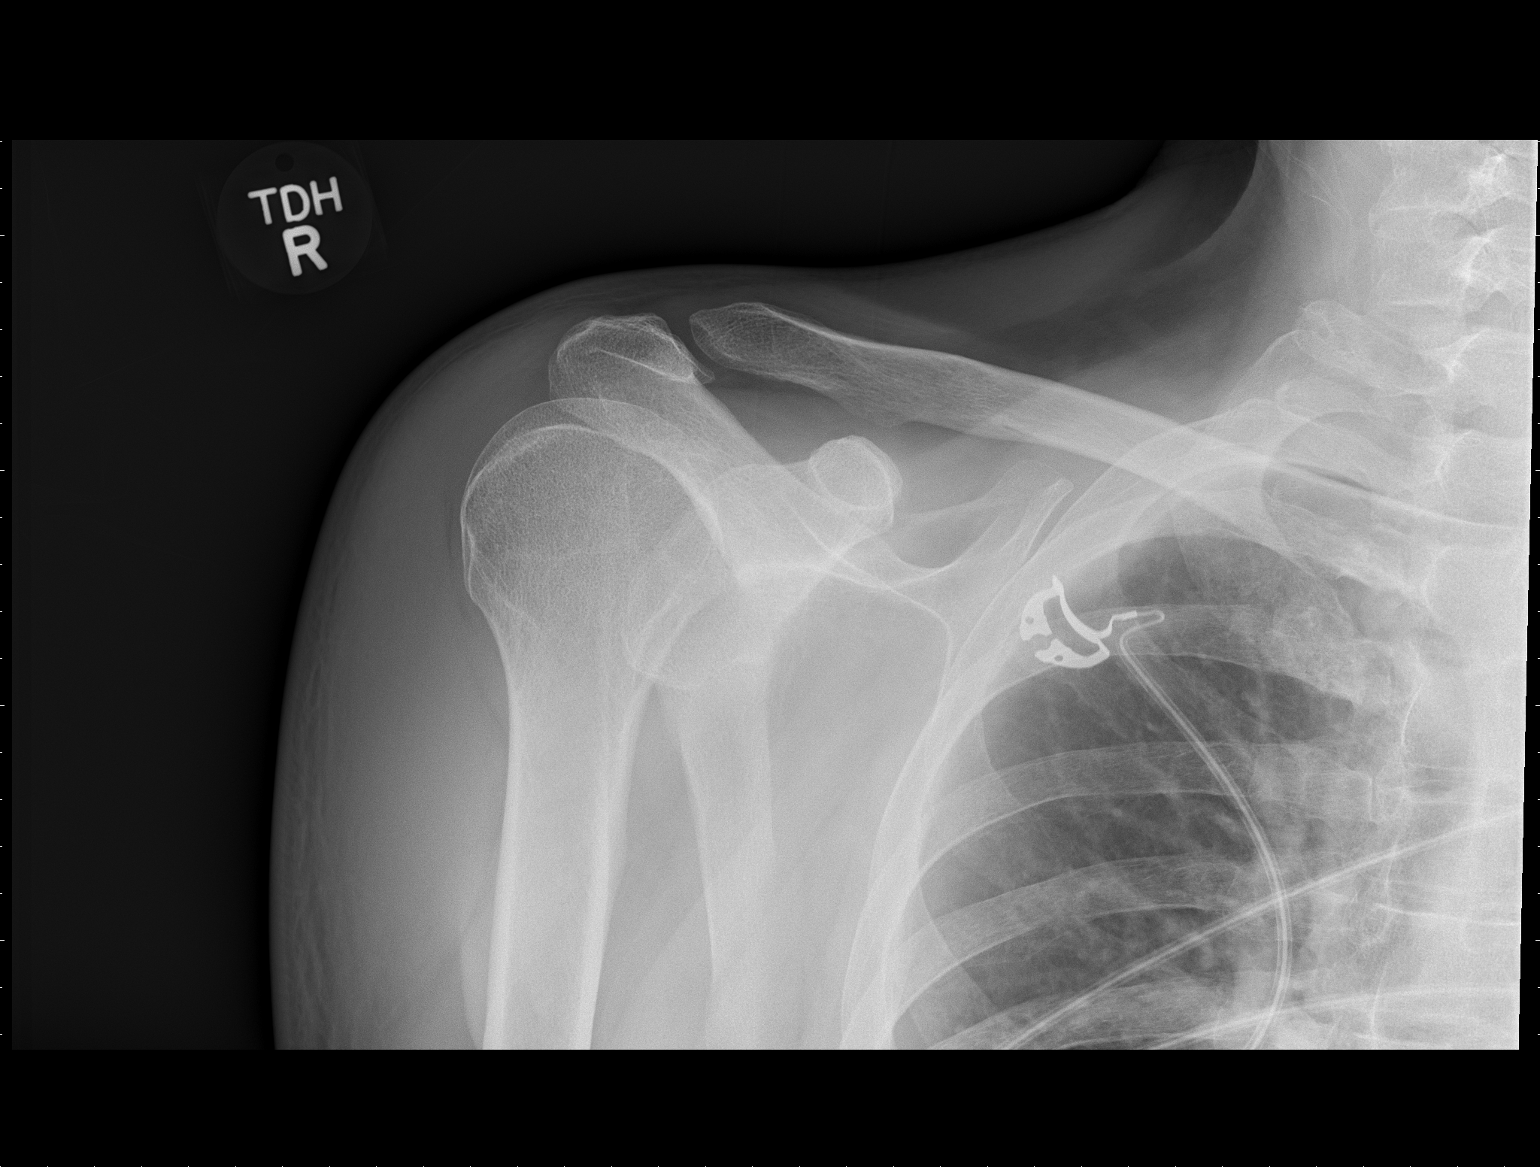

[AP (2 of 3)]
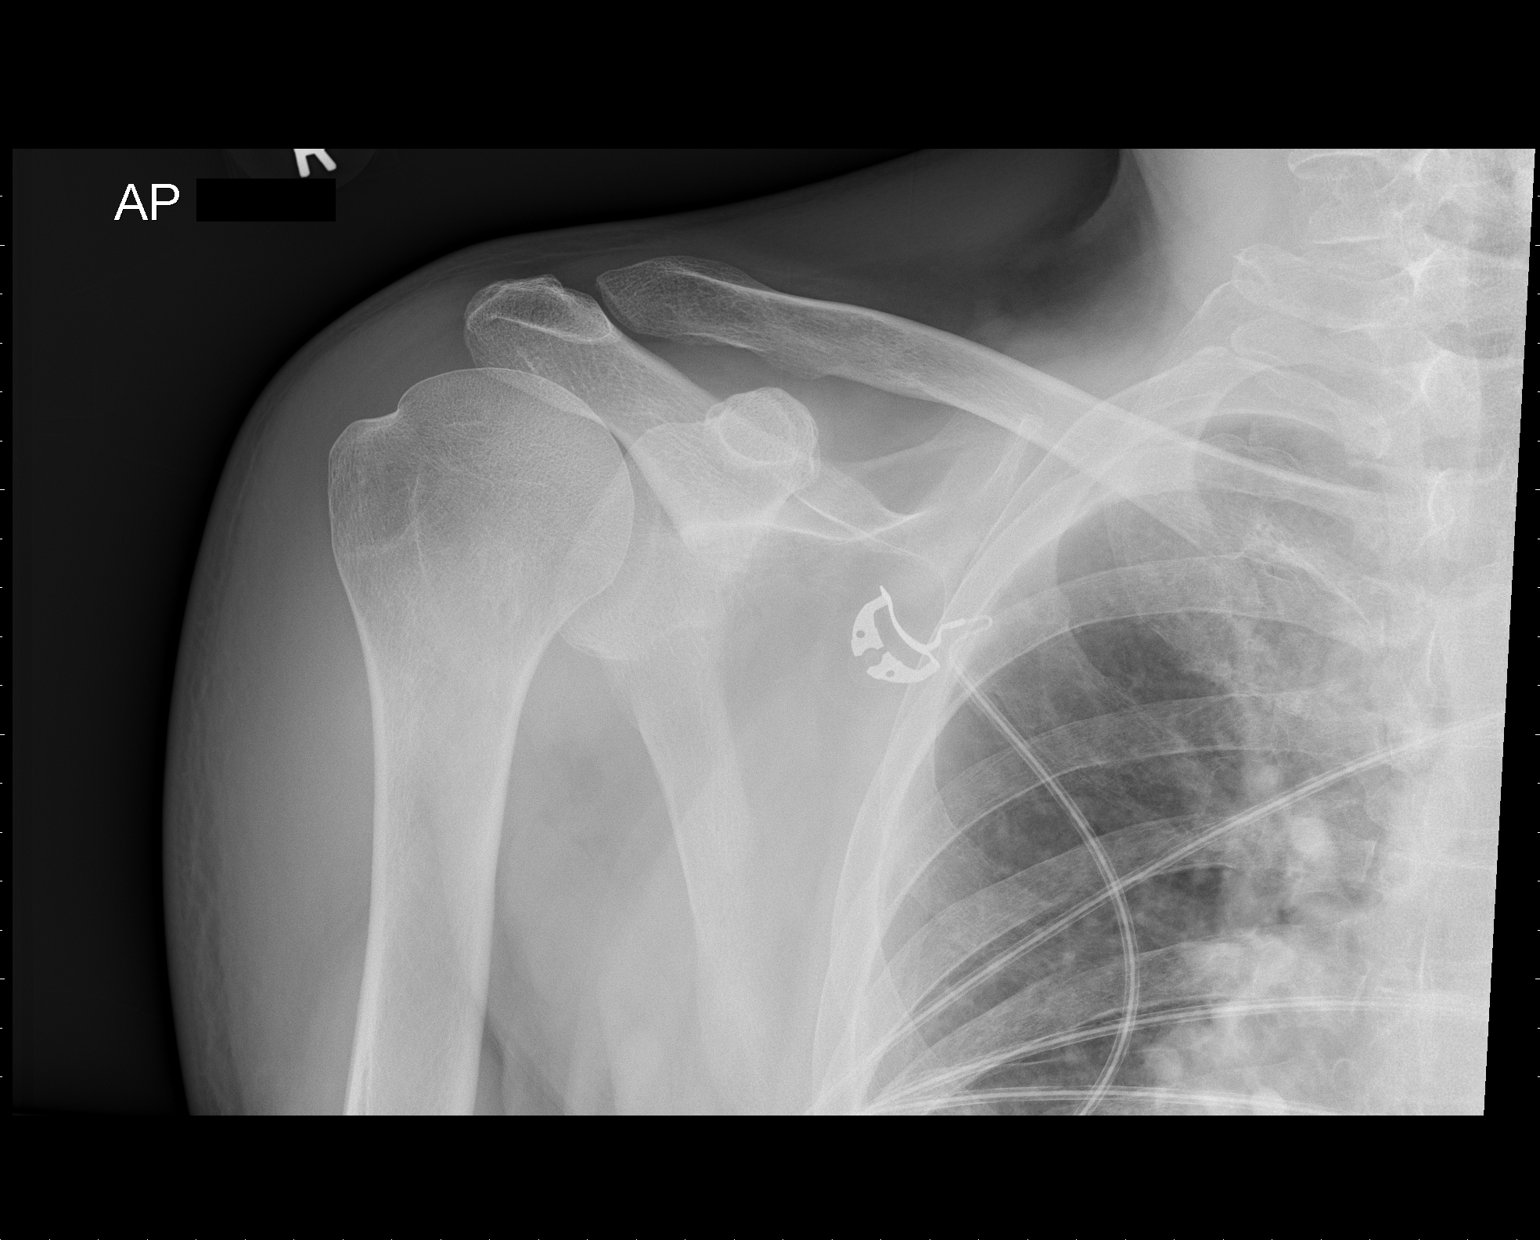

[AP (3 of 3)]
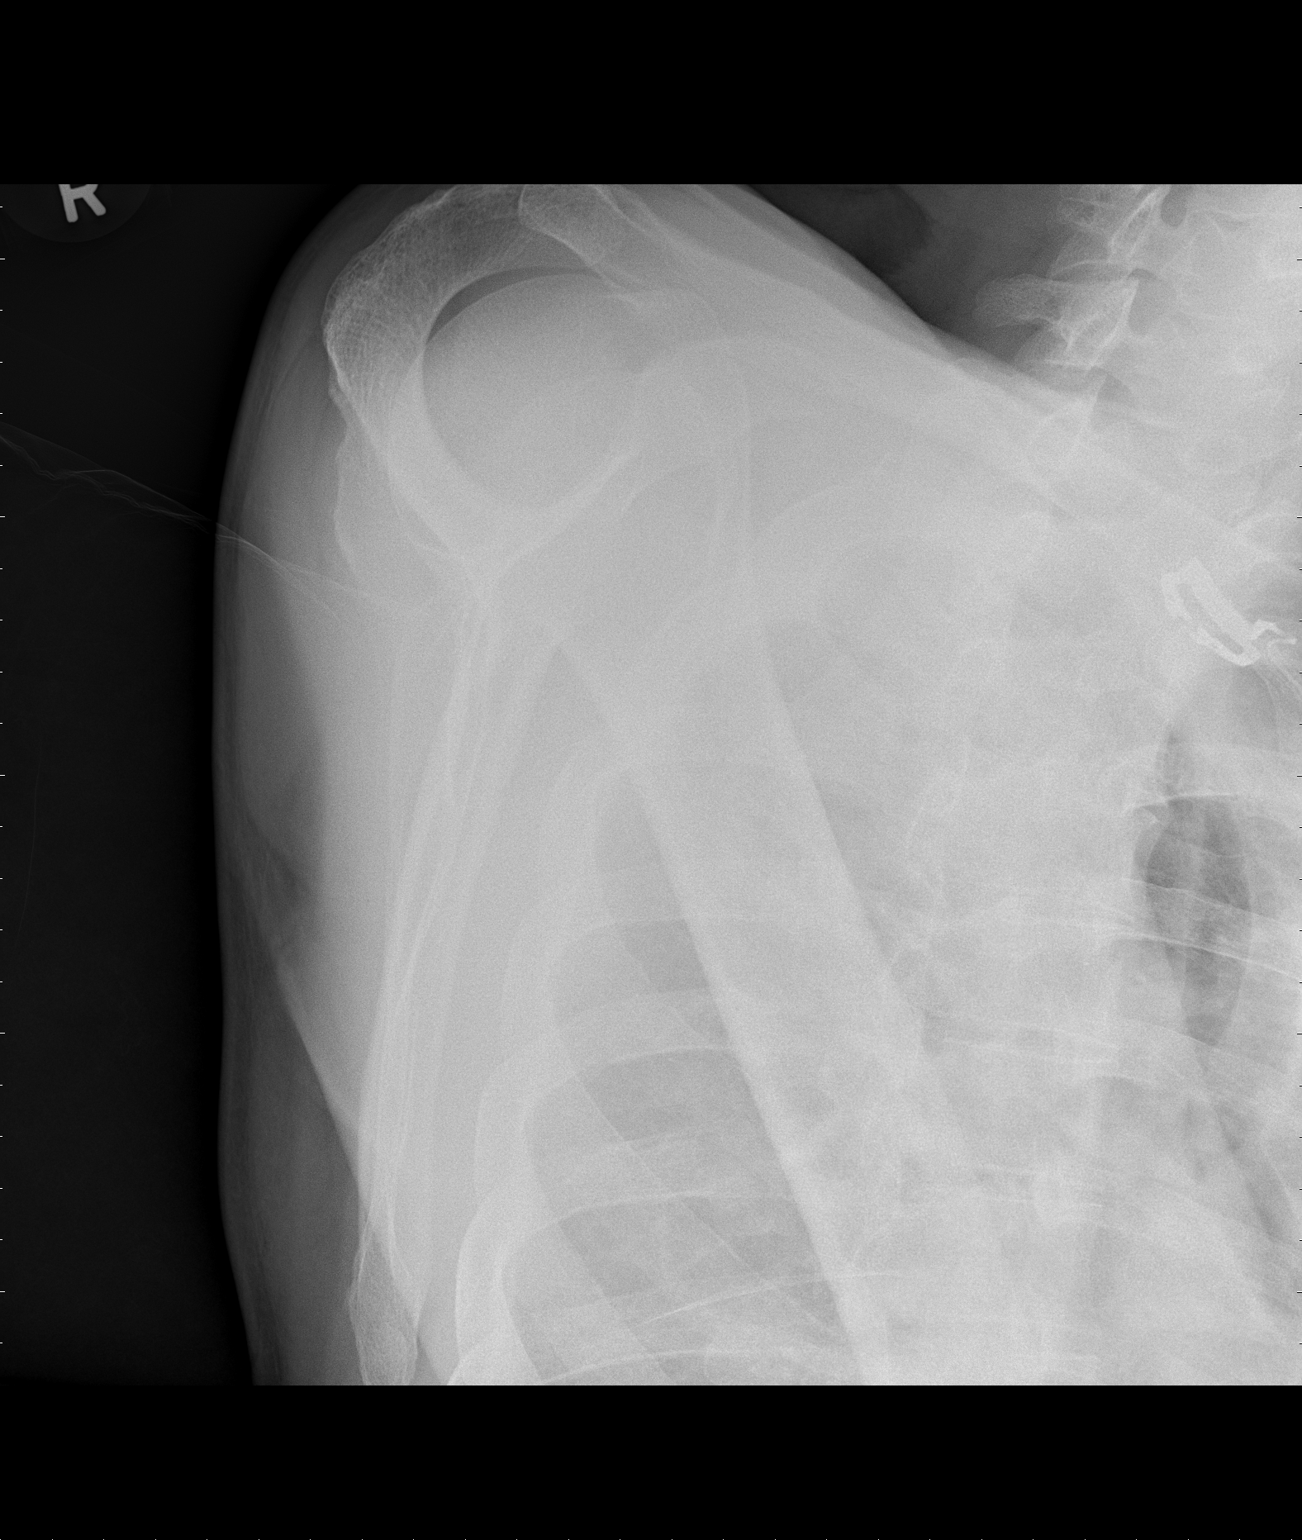

[3 of 3 positions shown; findings below may reference images not displayed]

FINDINGS: The humerus is located and the acromioclavicular joint is
intact.  No notable degenerative disease about the shoulder.
Imaged right lung and ribs are unremarkable.
IMPRESSION: Negative study.

## 2012-05-02 MED ORDER — ALTEPLASE 100 MG IV SOLR
4.0000 mg | Freq: Once | INTRAVENOUS | Status: AC
  Administered 2012-05-02: 4 mg
  Filled 2012-05-02: qty 4

## 2012-05-02 MED ORDER — FENTANYL CITRATE 0.05 MG/ML IJ SOLN
INTRAMUSCULAR | Status: AC | PRN
  Administered 2012-05-02: 50 ug via INTRAVENOUS

## 2012-05-02 MED ORDER — HEPARIN SODIUM (PORCINE) 1000 UNIT/ML IJ SOLN
INTRAMUSCULAR | Status: AC
  Administered 2012-05-02: 3000 [IU]
  Filled 2012-05-02: qty 1

## 2012-05-02 MED ORDER — FENTANYL CITRATE 0.05 MG/ML IJ SOLN
INTRAMUSCULAR | Status: AC
  Filled 2012-05-02: qty 4

## 2012-05-02 MED ORDER — MIDAZOLAM HCL 2 MG/2ML IJ SOLN
INTRAMUSCULAR | Status: AC
  Filled 2012-05-02: qty 4

## 2012-05-02 MED ORDER — MIDAZOLAM HCL 5 MG/5ML IJ SOLN
INTRAMUSCULAR | Status: AC | PRN
  Administered 2012-05-02: 1 mg via INTRAVENOUS

## 2012-05-02 MED ORDER — IOHEXOL 300 MG/ML  SOLN
100.0000 mL | Freq: Once | INTRAMUSCULAR | Status: AC | PRN
  Administered 2012-05-02: 50 mL via INTRAVENOUS
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
## 2012-05-02 NOTE — Procedures (Signed)
Interventional Radiology Procedure Note  Procedure: Successful declot procedure of LUE straight brachial artery to axillary vein graft Complications: None Recommendations: - May resume dialysis - Pseudoaneuysm may be source of thrombus.  Consider stent-graft repair vs surgical revision.  Signed,  Sterling Big, MD Vascular & Interventional Radiologist [facility] Radiology

## 2012-05-11 ENCOUNTER — Other Ambulatory Visit: Payer: Self-pay | Admitting: *Deleted

## 2012-05-11 ENCOUNTER — Encounter (HOSPITAL_COMMUNITY): Payer: Self-pay | Admitting: Surgery

## 2012-05-11 ENCOUNTER — Ambulatory Visit (HOSPITAL_COMMUNITY): Payer: Medicare Other | Admitting: Anesthesiology

## 2012-05-11 ENCOUNTER — Ambulatory Visit (HOSPITAL_COMMUNITY): Payer: Medicare Other

## 2012-05-11 ENCOUNTER — Encounter (HOSPITAL_COMMUNITY): Payer: Self-pay | Admitting: Anesthesiology

## 2012-05-11 ENCOUNTER — Encounter (HOSPITAL_COMMUNITY)
Admission: RE | Disposition: A | Discharge status: Nursing Home (Includes ICF) | Payer: Self-pay | Source: Ambulatory Visit | Attending: Vascular Surgery

## 2012-05-11 ENCOUNTER — Ambulatory Visit (HOSPITAL_COMMUNITY)
Admission: RE | Admit: 2012-05-11 | Discharge: 2012-05-11 | Disposition: A | Discharge status: Other Acute Inpatient Hospital | Payer: Medicare Other | Source: Ambulatory Visit | Attending: Vascular Surgery | Admitting: Vascular Surgery

## 2012-05-11 DIAGNOSIS — J4489 Other specified chronic obstructive pulmonary disease: Secondary | ICD-10-CM | POA: Insufficient documentation

## 2012-05-11 DIAGNOSIS — N186 End stage renal disease: Secondary | ICD-10-CM | POA: Insufficient documentation

## 2012-05-11 DIAGNOSIS — I120 Hypertensive chronic kidney disease with stage 5 chronic kidney disease or end stage renal disease: Secondary | ICD-10-CM | POA: Insufficient documentation

## 2012-05-11 DIAGNOSIS — T82898A Other specified complication of vascular prosthetic devices, implants and grafts, initial encounter: Secondary | ICD-10-CM

## 2012-05-11 DIAGNOSIS — J449 Chronic obstructive pulmonary disease, unspecified: Secondary | ICD-10-CM | POA: Insufficient documentation

## 2012-05-11 DIAGNOSIS — Z992 Dependence on renal dialysis: Secondary | ICD-10-CM | POA: Insufficient documentation

## 2012-05-11 DIAGNOSIS — Y832 Surgical operation with anastomosis, bypass or graft as the cause of abnormal reaction of the patient, or of later complication, without mention of misadventure at the time of the procedure: Secondary | ICD-10-CM | POA: Insufficient documentation

## 2012-05-11 HISTORY — DX: Unspecified asthma, uncomplicated: J45.909

## 2012-05-11 HISTORY — PX: INTRAOPERATIVE ARTERIOGRAM: SHX5157

## 2012-05-11 HISTORY — DX: Pneumonia, unspecified organism: J18.9

## 2012-05-11 HISTORY — DX: Angina pectoris, unspecified: I20.9

## 2012-05-11 LAB — BASIC METABOLIC PANEL
BUN: 60 mg/dL — ABNORMAL HIGH (ref 6–23)
GFR calc Af Amer: 9 mL/min — ABNORMAL LOW (ref >90)
GFR calc non Af Amer: 7 mL/min — ABNORMAL LOW (ref >90)
Potassium: 6.1 mEq/L — ABNORMAL HIGH (ref 3.5–5.1)
Sodium: 136 mEq/L (ref 135–145)

## 2012-05-11 LAB — POCT I-STAT, CHEM 8
Calcium, Ion: 1.16 mmol/L (ref 1.12–1.23)
Chloride: 103 mEq/L (ref 96–112)
Glucose, Bld: 92 mg/dL (ref 70–99)
HCT: 28 % — ABNORMAL LOW (ref 39.0–52.0)
Hemoglobin: 9.5 g/dL — ABNORMAL LOW (ref 13.0–17.0)
TCO2: 23 mmol/L (ref 0–100)

## 2012-05-11 LAB — POCT I-STAT 4, (NA,K, GLUC, HGB,HCT)
Glucose, Bld: 114 mg/dL — ABNORMAL HIGH (ref 70–99)
HCT: 29 % — ABNORMAL LOW (ref 39.0–52.0)
Hemoglobin: 9.9 g/dL — ABNORMAL LOW (ref 13.0–17.0)
Potassium: 6.8 meq/L (ref 3.5–5.1)
Sodium: 132 meq/L — ABNORMAL LOW (ref 135–145)

## 2012-05-11 LAB — GLUCOSE, CAPILLARY

## 2012-05-11 LAB — SURGICAL PCR SCREEN: MRSA, PCR: NEGATIVE

## 2012-05-11 SURGERY — THROMBECTOMY AND REVISION OF ARTERIOVENTOUS (AV) GORETEX  GRAFT
Anesthesia: General | Site: Arm Upper | Laterality: Left | Wound class: Clean

## 2012-05-11 MED ORDER — VANCOMYCIN HCL IN DEXTROSE 1-5 GM/200ML-% IV SOLN
INTRAVENOUS | Status: AC
  Filled 2012-05-11: qty 200

## 2012-05-11 MED ORDER — THROMBIN 20000 UNITS EX SOLR
CUTANEOUS | Status: DC | PRN
  Administered 2012-05-11: 20000 [IU] via TOPICAL

## 2012-05-11 MED ORDER — SODIUM CHLORIDE 0.9 % IV SOLN
INTRAVENOUS | Status: DC
  Administered 2012-05-11 (×3): via INTRAVENOUS

## 2012-05-11 MED ORDER — SODIUM POLYSTYRENE SULFONATE 15 GM/60ML PO SUSP
45.0000 g | Freq: Once | ORAL | Status: AC
  Administered 2012-05-11: 45 g via ORAL
  Filled 2012-05-11: qty 180

## 2012-05-11 MED ORDER — IOHEXOL 300 MG/ML  SOLN
INTRAMUSCULAR | Status: DC | PRN
  Administered 2012-05-11: 50 mL via INTRAVENOUS
  Administered 2012-05-11: 20 mL via INTRAVENOUS

## 2012-05-11 MED ORDER — SODIUM CHLORIDE 0.9 % IR SOLN
Status: DC | PRN
  Administered 2012-05-11: 15:00:00

## 2012-05-11 MED ORDER — DEXTROSE 50 % IV SOLN
50.0000 mL | Freq: Once | INTRAVENOUS | Status: DC
  Filled 2012-05-11: qty 50

## 2012-05-11 MED ORDER — OXYCODONE HCL 5 MG PO TABS
ORAL_TABLET | ORAL | Status: AC
  Filled 2012-05-11: qty 2

## 2012-05-11 MED ORDER — OXYCODONE HCL 5 MG PO TABS
10.0000 mg | ORAL_TABLET | ORAL | Status: AC | PRN
  Administered 2012-05-11: 10 mg via ORAL

## 2012-05-11 MED ORDER — SUFENTANIL CITRATE 50 MCG/ML IV SOLN
INTRAVENOUS | Status: DC | PRN
  Administered 2012-05-11: 20 ug via INTRAVENOUS

## 2012-05-11 MED ORDER — INSULIN ASPART 100 UNIT/ML ~~LOC~~ SOLN
SUBCUTANEOUS | Status: DC | PRN
  Administered 2012-05-11: 10 [IU] via SUBCUTANEOUS

## 2012-05-11 MED ORDER — OXYCODONE-ACETAMINOPHEN 7.5-325 MG PO TABS: 1.0000 | ORAL_TABLET | ORAL | Status: DC | PRN

## 2012-05-11 MED ORDER — SURGIFOAM 100 EX MISC
CUTANEOUS | Status: DC | PRN
  Administered 2012-05-11: 17:00:00 via TOPICAL

## 2012-05-11 MED ORDER — ONDANSETRON HCL 4 MG/2ML IJ SOLN
INTRAMUSCULAR | Status: DC | PRN
  Administered 2012-05-11: 4 mg via INTRAVENOUS

## 2012-05-11 MED ORDER — MUPIROCIN 2 % EX OINT
TOPICAL_OINTMENT | CUTANEOUS | Status: AC
  Administered 2012-05-11: 1
  Filled 2012-05-11: qty 22

## 2012-05-11 MED ORDER — PROPOFOL 10 MG/ML IV BOLUS
INTRAVENOUS | Status: DC | PRN
  Administered 2012-05-11: 170 mg via INTRAVENOUS

## 2012-05-11 MED ORDER — VANCOMYCIN HCL 1000 MG IV SOLR
1500.0000 mg | INTRAVENOUS | Status: AC
  Administered 2012-05-11: 1000 mg via INTRAVENOUS
  Filled 2012-05-11: qty 1500

## 2012-05-11 MED ORDER — PHENYLEPHRINE HCL 10 MG/ML IJ SOLN
10.0000 mg | INTRAVENOUS | Status: DC | PRN
  Administered 2012-05-11: 20 ug/min via INTRAVENOUS

## 2012-05-11 MED ORDER — ALBUTEROL SULFATE (5 MG/ML) 0.5% IN NEBU
INHALATION_SOLUTION | RESPIRATORY_TRACT | Status: AC
  Filled 2012-05-11: qty 0.5

## 2012-05-11 MED ORDER — THROMBIN 20000 UNITS EX SOLR
CUTANEOUS | Status: AC
  Filled 2012-05-11: qty 20000

## 2012-05-11 MED ORDER — PROTAMINE SULFATE 10 MG/ML IV SOLN
INTRAVENOUS | Status: DC | PRN
  Administered 2012-05-11: 50 mg via INTRAVENOUS

## 2012-05-11 MED ORDER — INSULIN REGULAR HUMAN 100 UNIT/ML IJ SOLN
10.0000 [IU] | Freq: Once | INTRAMUSCULAR | Status: DC
  Filled 2012-05-11: qty 0.1

## 2012-05-11 MED ORDER — HEPARIN SODIUM (PORCINE) 1000 UNIT/ML IJ SOLN
INTRAMUSCULAR | Status: DC | PRN
  Administered 2012-05-11: 3000 [IU] via INTRAVENOUS
  Administered 2012-05-11: 5000 [IU] via INTRAVENOUS

## 2012-05-11 MED ORDER — MIDAZOLAM HCL 5 MG/5ML IJ SOLN
INTRAMUSCULAR | Status: DC | PRN
  Administered 2012-05-11: 1 mg via INTRAVENOUS

## 2012-05-11 MED ORDER — 0.9 % SODIUM CHLORIDE (POUR BTL) OPTIME
TOPICAL | Status: DC | PRN
  Administered 2012-05-11: 1000 mL

## 2012-05-11 MED ORDER — DEXTROSE 50 % IV SOLN
INTRAVENOUS | Status: DC | PRN
  Administered 2012-05-11: 25 g via INTRAVENOUS

## 2012-05-11 SURGICAL SUPPLY — 56 items
CANISTER SUCTION 2500CC (MISCELLANEOUS) ×3 IMPLANT
CATH EMB 3FR 80CM (CATHETERS) ×3 IMPLANT
CATH EMB 4FR 80CM (CATHETERS) ×6 IMPLANT
CLIP TI MEDIUM 6 (CLIP) ×3 IMPLANT
CLIP TI WIDE RED SMALL 6 (CLIP) ×3 IMPLANT
CLOTH BEACON ORANGE TIMEOUT ST (SAFETY) ×3 IMPLANT
COVER SURGICAL LIGHT HANDLE (MISCELLANEOUS) ×3 IMPLANT
DECANTER SPIKE VIAL GLASS SM (MISCELLANEOUS) ×3 IMPLANT
DERMABOND ADVANCED (GAUZE/BANDAGES/DRESSINGS) ×2
DERMABOND ADVANCED .7 DNX12 (GAUZE/BANDAGES/DRESSINGS) ×4 IMPLANT
DRAPE C-ARM 42X72 X-RAY (DRAPES) ×3 IMPLANT
DRAPE X-RAY CASS 24X20 (DRAPES) IMPLANT
ELECT REM PT RETURN 9FT ADLT (ELECTROSURGICAL) ×3
ELECTRODE REM PT RTRN 9FT ADLT (ELECTROSURGICAL) ×2 IMPLANT
GAUZE SPONGE 2X2 8PLY STRL LF (GAUZE/BANDAGES/DRESSINGS) ×2 IMPLANT
GEL ULTRASOUND 20GR AQUASONIC (MISCELLANEOUS) IMPLANT
GLOVE BIO SURGEON STRL SZ7 (GLOVE) ×6 IMPLANT
GLOVE BIO SURGEON STRL SZ7.5 (GLOVE) ×3 IMPLANT
GLOVE BIOGEL PI IND STRL 6.5 (GLOVE) ×6 IMPLANT
GLOVE BIOGEL PI IND STRL 7.0 (GLOVE) ×4 IMPLANT
GLOVE BIOGEL PI IND STRL 7.5 (GLOVE) ×8 IMPLANT
GLOVE BIOGEL PI INDICATOR 6.5 (GLOVE) ×3
GLOVE BIOGEL PI INDICATOR 7.0 (GLOVE) ×2
GLOVE BIOGEL PI INDICATOR 7.5 (GLOVE) ×4
GLOVE SS BIOGEL STRL SZ 7 (GLOVE) ×2 IMPLANT
GLOVE SUPERSENSE BIOGEL SZ 7 (GLOVE) ×1
GLOVE SURG SS PI 6.5 STRL IVOR (GLOVE) ×3 IMPLANT
GLOVE SURG SS PI 7.5 STRL IVOR (GLOVE) ×12 IMPLANT
GOWN BRE IMP SLV AUR LG STRL (GOWN DISPOSABLE) ×3 IMPLANT
GOWN BRE IMP SLV AUR XL STRL (GOWN DISPOSABLE) ×6 IMPLANT
GOWN PREVENTION PLUS XLARGE (GOWN DISPOSABLE) ×6 IMPLANT
GOWN STRL NON-REIN LRG LVL3 (GOWN DISPOSABLE) ×6 IMPLANT
GRAFT GORETEX 6X10 (Vascular Products) ×3 IMPLANT
KIT BASIN OR (CUSTOM PROCEDURE TRAY) ×3 IMPLANT
KIT ROOM TURNOVER OR (KITS) ×3 IMPLANT
LOOP VESSEL MINI RED (MISCELLANEOUS) IMPLANT
NS IRRIG 1000ML POUR BTL (IV SOLUTION) ×3 IMPLANT
PACK CV ACCESS (CUSTOM PROCEDURE TRAY) ×3 IMPLANT
PAD ARMBOARD 7.5X6 YLW CONV (MISCELLANEOUS) ×6 IMPLANT
SET COLLECT BLD 21X3/4 12 (NEEDLE) ×3 IMPLANT
SET COLLECT BLD 21X3/4 12 PB (MISCELLANEOUS) ×3 IMPLANT
SPONGE GAUZE 2X2 STER 10/PKG (GAUZE/BANDAGES/DRESSINGS) ×1
SPONGE GAUZE 4X4 12PLY (GAUZE/BANDAGES/DRESSINGS) ×3 IMPLANT
SPONGE SURGIFOAM ABS GEL 100 (HEMOSTASIS) IMPLANT
STOPCOCK 4 WAY LG BORE MALE ST (IV SETS) ×3 IMPLANT
SUT PROLENE 6 0 CC (SUTURE) ×6 IMPLANT
SUT VIC AB 3-0 SH 27 (SUTURE) ×1
SUT VIC AB 3-0 SH 27X BRD (SUTURE) ×2 IMPLANT
SUT VICRYL 4-0 PS2 18IN ABS (SUTURE) ×6 IMPLANT
SYR 30ML LL (SYRINGE) ×3 IMPLANT
TAPE CLOTH SURG 4X10 WHT LF (GAUZE/BANDAGES/DRESSINGS) ×3 IMPLANT
TOWEL OR 17X24 6PK STRL BLUE (TOWEL DISPOSABLE) ×3 IMPLANT
TOWEL OR 17X26 10 PK STRL BLUE (TOWEL DISPOSABLE) ×3 IMPLANT
TUBING EXTENTION W/L.L. (IV SETS) ×6 IMPLANT
UNDERPAD 30X30 INCONTINENT (UNDERPADS AND DIAPERS) ×3 IMPLANT
WATER STERILE IRR 1000ML POUR (IV SOLUTION) ×3 IMPLANT

## 2012-05-11 NOTE — OR Nursing (Signed)
(+)   bruit/thrill lue AVGG with 2+ L rad pulse
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
## 2012-05-11 NOTE — Transfer of Care (Signed)
Immediate Anesthesia Transfer of Care Note  Patient: [name]  Procedure(s) Performed: Procedure(s) (LRB) with comments: THROMBECTOMY AND REVISION OF ARTERIOVENTOUS (AV) GORETEX  GRAFT (Left) - using 6 mm x 10 cm goretex graft INTRA OPERATIVE ARTERIOGRAM (Left)  Patient Location: PACU  Anesthesia Type: General  Level of Consciousness: awake and sedated  Airway & Oxygen Therapy: Patient Spontanous Breathing and Patient connected to nasal cannula oxygen  Post-op Assessment: Report given to PACU RN and Post -op Vital signs reviewed and stable  Post vital signs: Reviewed and stable  Complications: No apparent anesthesia complications

## 2012-05-11 NOTE — OR Nursing (Signed)
Dr Pearson Grippe aware of repeat K 6.1 / will also notify dr Eliott Nine

## 2012-05-11 NOTE — Anesthesia Preprocedure Evaluation (Signed)
Anesthesia Evaluation  Patient identified by MRN, date of birth, ID band Patient awake    Reviewed: Allergy & Precautions, H&P , NPO status   History of Anesthesia Complications Negative for: history of anesthetic complications  Airway Mallampati: I TM Distance: >3 FB Neck ROM: Full    Dental  (+) Edentulous Upper   Pulmonary asthma , pneumonia -, resolved, COPD   + decreased breath sounds      Cardiovascular hypertension, Pt. on medications Rhythm:Regular Rate:Normal     Neuro/Psych    GI/Hepatic (+) Hepatitis -, C  Endo/Other    Renal/GU ESRFRenal disease     Musculoskeletal   Abdominal   Peds  Hematology   Anesthesia Other Findings   Reproductive/Obstetrics                           Anesthesia Physical Anesthesia Plan  ASA: III  Anesthesia Plan: General   Post-op Pain Management:    Induction: Intravenous  Airway Management Planned: LMA  Additional Equipment:   Intra-op Plan:   Post-operative Plan:   Informed Consent:   Plan Discussed with: CRNA and Anesthesiologist  Anesthesia Plan Comments:         Anesthesia Quick Evaluation

## 2012-05-11 NOTE — Anesthesia Postprocedure Evaluation (Signed)
  Anesthesia Post-op Note  Patient: [name]  Procedure(s) Performed: Procedure(s) (LRB) with comments: THROMBECTOMY AND REVISION OF ARTERIOVENTOUS (AV) GORETEX  GRAFT (Left) - using 6 mm x 10 cm goretex graft INTRA OPERATIVE ARTERIOGRAM (Left)  Patient Location: PACU  Anesthesia Type: General  Level of Consciousness: awake and alert   Airway and Oxygen Therapy: Patient Spontanous Breathing  Post-op Pain: mild  Post-op Assessment: Post-op Vital signs reviewed, Patient's Cardiovascular Status Stable, Respiratory Function Stable, Patent Airway, No signs of Nausea or vomiting and Pain level controlled  Post-op Vital Signs: stable  Complications: No apparent anesthesia complications

## 2012-05-11 NOTE — OR Nursing (Signed)
De dunham notified of k 6.1 and pt to have hemo per usual in am at 11am---> kayexalate upon d/c from pacu and d/c home

## 2012-05-11 NOTE — OR Nursing (Signed)
Pt stable/ no pain, awaiting BMP results  Pending d/c decision

## 2012-05-11 NOTE — Op Note (Signed)
Procedure: Thrombectomy and revision of left upper arm AV graft, left upper extremity arteriogram  Preoperative diagnosis: Thrombosed AV graft left arm  Postoperative diagnosis: Same  Anesthesia: General  Assistant: Lianne Cure PA-C  Operative findings: Greater than 8 mm outflow vein, revised graft from end to side to end to end  Operative details: After obtaining informed consent, the patient was taken to the operating room. The patient was placed in supine position on the operating room table. After commencing general anesthesia, the patient's entire left upper extremity was prepped and draped in the usual sterile fashion. A longitudinal incision was made in this location through a pre-existing scar in the axilla. The incision was carried into the subcutaneous tissues down to level the graft. The graft was dissected free circumferentially. Dissection was carried down to the level of the venous anastomosis. This was a very large axillary vein between 8 and 10 mm in diameter. The prior anastomosis was end to side and the vein was dissected free above and below the anastomosis.  The patient was given 5000 units of intravenous heparin. A transverse graftotomy was made just above the level of the anastomosis. A #4 Fogarty catheter was used to thrombectomize the venous limb of the graft. There was some venous backbleeding but there were still intimal hyperplasia within the vein and the vein was quite thickened. Therefore the vein was opened longitudinally past the segment intimal hyperplasia. The thickened portion of vein was debrided away. The arterial limb the graft was then thrombectomized with a #4 Fogarty catheter excellent arterial inflow was obtained. The fogarty would not pass easily through the anastomosis and eventually became stuck in the patient.  It was removed with gentle traction but it appeared a portion of the balloon was missing or had ruptured.  Therefore at this point an arteriogram  was obtained by placing a 21 g butterfly into the graft.  The entire graft, brachial artery, radial artery and ulnar arteries were visualized with no evidence of embolus.  The interosseous artery did not fill in the distal forearm but this appeared to be chronic with no acute cutoff.  The needle was then removed and the hole repaired with a 6 0 prolene suture.  The graft was clamped proximally with a fistula clamp. A new 6 mm interposition graft was brought up in the operative field and sewn end-to-end to the vein using a running 5-0 Prolene suture. The vein in the distal arm was ligated with a 2 0 silk tie.  At completion of the anastomosis the venous limb was thoroughly flushed with heparinized saline and reoccluded. The graft was cut to length in preparation for sewing to the proximal graft. This was then sewn end-to-end to the proximal arterial limb of the graft using running 6-0 Prolene suture. Just prior to completion, the anastomosis was forebled backbled and thoroughly flushed. The anastomosis was secured; clamps were released; and there was a palpable thrill in the graft immediately. Hemostasis was obtained with direct pressure and the assistance of 50 mg of protamine. The subcutaneous tissues were reapproximated with running 3-0 Vicryl suture. The skin was closed with a 4 0 Vicryl subcuticular stitch. Dermabond was applied the incision. The patient tolerated the procedure well and there were no complications. Instrument sponge and needle counts were correct at the end of the case. The patient was taken to the recovery room in stable condition.  The patient had a palpable radial pulse at the end of the case.  Fabienne Bruns, MD [facility] [place] Office: [phone] Pager: [phone]

## 2012-05-11 NOTE — OR Nursing (Signed)
Has eaten a [place] sandwich and drinking soda/ non sugar free plus is eating nabs

## 2012-05-12 LAB — GLUCOSE, CAPILLARY: Glucose-Capillary: 40 mg/dL — CL (ref 70–99)

## 2012-05-12 NOTE — Anesthesia Postprocedure Evaluation (Signed)
  Anesthesia Post-op Note  Patient: [name]  Procedure(s) Performed: Procedure(s) (LRB) with comments: THROMBECTOMY AND REVISION OF ARTERIOVENTOUS (AV) GORETEX  GRAFT (Left) - using 6 mm x 10 cm goretex graft INTRA OPERATIVE ARTERIOGRAM (Left)  Patient Location: PACU  Anesthesia Type: General  Level of Consciousness: awake, alert  and oriented  Airway and Oxygen Therapy: Patient Spontanous Breathing  Post-op Pain: none  Post-op Assessment: Post-op Vital signs reviewed and Patient's Cardiovascular Status Stable  Post-op Vital Signs: stable  Complications: No apparent anesthesia complications

## 2012-05-15 ENCOUNTER — Other Ambulatory Visit: Payer: Self-pay

## 2012-05-15 ENCOUNTER — Encounter (HOSPITAL_COMMUNITY): Payer: Self-pay

## 2012-05-15 ENCOUNTER — Encounter (HOSPITAL_COMMUNITY): Payer: Self-pay | Admitting: Critical Care Medicine

## 2012-05-15 ENCOUNTER — Inpatient Hospital Stay (HOSPITAL_COMMUNITY): Payer: Medicare Other | Admitting: Critical Care Medicine

## 2012-05-15 ENCOUNTER — Encounter (HOSPITAL_COMMUNITY)
Admission: RE | Disposition: A | Discharge status: Skilled Nursing Facility | Payer: Self-pay | Source: Ambulatory Visit | Attending: Surgery

## 2012-05-15 ENCOUNTER — Observation Stay (HOSPITAL_COMMUNITY)
Admission: RE | Admit: 2012-05-15 | Discharge: 2012-05-16 | Disposition: A | Discharge status: Skilled Nursing Facility | Payer: Medicare Other | Source: Ambulatory Visit | Attending: Surgery | Admitting: Surgery

## 2012-05-15 ENCOUNTER — Encounter (HOSPITAL_COMMUNITY): Payer: Self-pay | Admitting: Vascular Surgery

## 2012-05-15 ENCOUNTER — Encounter (HOSPITAL_COMMUNITY): Payer: Self-pay | Admitting: *Deleted

## 2012-05-15 DIAGNOSIS — T82898A Other specified complication of vascular prosthetic devices, implants and grafts, initial encounter: Principal | ICD-10-CM | POA: Insufficient documentation

## 2012-05-15 DIAGNOSIS — J4489 Other specified chronic obstructive pulmonary disease: Secondary | ICD-10-CM | POA: Insufficient documentation

## 2012-05-15 DIAGNOSIS — I120 Hypertensive chronic kidney disease with stage 5 chronic kidney disease or end stage renal disease: Secondary | ICD-10-CM | POA: Insufficient documentation

## 2012-05-15 DIAGNOSIS — B182 Chronic viral hepatitis C: Secondary | ICD-10-CM | POA: Insufficient documentation

## 2012-05-15 DIAGNOSIS — N186 End stage renal disease: Secondary | ICD-10-CM | POA: Insufficient documentation

## 2012-05-15 DIAGNOSIS — Y832 Surgical operation with anastomosis, bypass or graft as the cause of abnormal reaction of the patient, or of later complication, without mention of misadventure at the time of the procedure: Secondary | ICD-10-CM | POA: Insufficient documentation

## 2012-05-15 DIAGNOSIS — D61818 Other pancytopenia: Secondary | ICD-10-CM | POA: Insufficient documentation

## 2012-05-15 DIAGNOSIS — Z8711 Personal history of peptic ulcer disease: Secondary | ICD-10-CM | POA: Insufficient documentation

## 2012-05-15 DIAGNOSIS — Z992 Dependence on renal dialysis: Secondary | ICD-10-CM | POA: Insufficient documentation

## 2012-05-15 DIAGNOSIS — Z23 Encounter for immunization: Secondary | ICD-10-CM | POA: Insufficient documentation

## 2012-05-15 DIAGNOSIS — J449 Chronic obstructive pulmonary disease, unspecified: Secondary | ICD-10-CM | POA: Insufficient documentation

## 2012-05-15 LAB — POCT I-STAT, CHEM 8
Chloride: 103 mEq/L (ref 96–112)
HCT: 26 % — ABNORMAL LOW (ref 39.0–52.0)
Hemoglobin: 8.8 g/dL — ABNORMAL LOW (ref 13.0–17.0)
Potassium: 4.7 mEq/L (ref 3.5–5.1)
Sodium: 138 mEq/L (ref 135–145)

## 2012-05-15 SURGERY — THROMBECTOMY AND REVISION OF ARTERIOVENTOUS (AV) GORETEX  GRAFT
Anesthesia: General | Site: Arm Upper | Laterality: Left | Wound class: Clean

## 2012-05-15 MED ORDER — VITAMIN B-1 50 MG PO TABS: 50.0000 mg | ORAL_TABLET | Freq: Every day | ORAL | Status: DC

## 2012-05-15 MED ORDER — VITAMIN B-1 50 MG PO TABS
50.0000 mg | ORAL_TABLET | Freq: Every day | ORAL | Status: DC
  Administered 2012-05-16: 50 mg via ORAL
  Filled 2012-05-15: qty 1

## 2012-05-15 MED ORDER — ONDANSETRON HCL 4 MG/2ML IJ SOLN
INTRAMUSCULAR | Status: DC | PRN
  Administered 2012-05-15: 4 mg via INTRAVENOUS

## 2012-05-15 MED ORDER — SODIUM CHLORIDE 0.9 % IV SOLN
INTRAVENOUS | Status: DC | PRN
  Administered 2012-05-15: 19:00:00 via INTRAVENOUS

## 2012-05-15 MED ORDER — DOCUSATE SODIUM 100 MG PO CAPS
100.0000 mg | ORAL_CAPSULE | Freq: Every day | ORAL | Status: DC
  Administered 2012-05-16: 100 mg via ORAL
  Filled 2012-05-15: qty 1

## 2012-05-15 MED ORDER — OXYCODONE HCL 5 MG/5ML PO SOLN: 5.0000 mg | Freq: Once | ORAL | Status: AC | PRN

## 2012-05-15 MED ORDER — MORPHINE SULFATE 2 MG/ML IJ SOLN
2.0000 mg | INTRAMUSCULAR | Status: DC | PRN
  Administered 2012-05-15 – 2012-05-16 (×2): 2 mg via INTRAVENOUS
  Filled 2012-05-15 (×2): qty 1

## 2012-05-15 MED ORDER — MUPIROCIN 2 % EX OINT
TOPICAL_OINTMENT | Freq: Two times a day (BID) | CUTANEOUS | Status: DC
  Administered 2012-05-15: 1 via NASAL
  Filled 2012-05-15 (×2): qty 22

## 2012-05-15 MED ORDER — SODIUM CHLORIDE 0.9 % IV SOLN: INTRAVENOUS | Status: DC

## 2012-05-15 MED ORDER — ACETAMINOPHEN 325 MG PO TABS: 325.0000 mg | ORAL_TABLET | ORAL | Status: DC | PRN

## 2012-05-15 MED ORDER — PANTOPRAZOLE SODIUM 40 MG PO TBEC: 40.0000 mg | DELAYED_RELEASE_TABLET | Freq: Every day | ORAL | Status: DC

## 2012-05-15 MED ORDER — VANCOMYCIN HCL IN DEXTROSE 1-5 GM/200ML-% IV SOLN
1000.0000 mg | INTRAVENOUS | Status: AC
  Administered 2012-05-15: 1000 mg via INTRAVENOUS
  Filled 2012-05-15: qty 200

## 2012-05-15 MED ORDER — NITROGLYCERIN 0.4 MG/HR TD PT24
0.4000 mg | MEDICATED_PATCH | Freq: Every day | TRANSDERMAL | Status: DC
  Administered 2012-05-16: 0.4 mg via TRANSDERMAL
  Filled 2012-05-15: qty 1

## 2012-05-15 MED ORDER — PHENYLEPHRINE HCL 10 MG/ML IJ SOLN
INTRAMUSCULAR | Status: DC | PRN
  Administered 2012-05-15: 80 ug via INTRAVENOUS

## 2012-05-15 MED ORDER — SACCHAROMYCES BOULARDII 250 MG PO CAPS
250.0000 mg | ORAL_CAPSULE | Freq: Two times a day (BID) | ORAL | Status: DC
  Administered 2012-05-15 – 2012-05-16 (×2): 250 mg via ORAL
  Filled 2012-05-15 (×3): qty 1

## 2012-05-15 MED ORDER — MIDAZOLAM HCL 5 MG/5ML IJ SOLN
INTRAMUSCULAR | Status: DC | PRN
  Administered 2012-05-15: 2 mg via INTRAVENOUS

## 2012-05-15 MED ORDER — GABAPENTIN 300 MG PO CAPS
300.0000 mg | ORAL_CAPSULE | Freq: Every day | ORAL | Status: DC
  Administered 2012-05-15: 300 mg via ORAL
  Filled 2012-05-15 (×2): qty 1

## 2012-05-15 MED ORDER — SENNOSIDES-DOCUSATE SODIUM 8.6-50 MG PO TABS
1.0000 | ORAL_TABLET | Freq: Every evening | ORAL | Status: DC | PRN
  Filled 2012-05-15: qty 1

## 2012-05-15 MED ORDER — AMLODIPINE BESYLATE 10 MG PO TABS
10.0000 mg | ORAL_TABLET | Freq: Every day | ORAL | Status: DC
  Administered 2012-05-16: 10 mg via ORAL
  Filled 2012-05-15: qty 1

## 2012-05-15 MED ORDER — LABETALOL HCL 5 MG/ML IV SOLN
10.0000 mg | INTRAVENOUS | Status: DC | PRN
  Filled 2012-05-15: qty 4

## 2012-05-15 MED ORDER — OXYCODONE-ACETAMINOPHEN 5-325 MG PO TABS
1.5000 | ORAL_TABLET | ORAL | Status: DC | PRN
  Administered 2012-05-16 (×2): 1.5 via ORAL
  Filled 2012-05-15 (×2): qty 2

## 2012-05-15 MED ORDER — OXYCODONE HCL 5 MG PO TABS
ORAL_TABLET | ORAL | Status: AC
  Filled 2012-05-15: qty 1

## 2012-05-15 MED ORDER — RENA-VITE PO TABS
1.0000 | ORAL_TABLET | Freq: Every day | ORAL | Status: DC
  Administered 2012-05-15: 1 via ORAL
  Filled 2012-05-15 (×2): qty 1

## 2012-05-15 MED ORDER — CLONIDINE HCL 0.1 MG PO TABS
0.1000 mg | ORAL_TABLET | Freq: Two times a day (BID) | ORAL | Status: DC
  Administered 2012-05-15 – 2012-05-16 (×2): 0.1 mg via ORAL
  Filled 2012-05-15 (×3): qty 1

## 2012-05-15 MED ORDER — FOLIC ACID 1 MG PO TABS
1.0000 mg | ORAL_TABLET | Freq: Every day | ORAL | Status: DC
  Administered 2012-05-16: 1 mg via ORAL
  Filled 2012-05-15: qty 1

## 2012-05-15 MED ORDER — HYDRALAZINE HCL 20 MG/ML IJ SOLN
10.0000 mg | INTRAMUSCULAR | Status: DC | PRN
  Filled 2012-05-15: qty 0.5

## 2012-05-15 MED ORDER — DEXTROSE 5 % IV SOLN
1.5000 g | INTRAVENOUS | Status: DC
  Filled 2012-05-15: qty 1.5

## 2012-05-15 MED ORDER — MAGNESIUM SULFATE 40 MG/ML IJ SOLN
2.0000 g | Freq: Once | INTRAMUSCULAR | Status: AC | PRN
  Filled 2012-05-15: qty 50

## 2012-05-15 MED ORDER — FENTANYL CITRATE 0.05 MG/ML IJ SOLN
INTRAMUSCULAR | Status: AC
  Filled 2012-05-15: qty 2

## 2012-05-15 MED ORDER — ALBUTEROL SULFATE HFA 108 (90 BASE) MCG/ACT IN AERS
2.0000 | INHALATION_SPRAY | Freq: Four times a day (QID) | RESPIRATORY_TRACT | Status: DC
  Administered 2012-05-16 (×2): 2 via RESPIRATORY_TRACT
  Filled 2012-05-15: qty 6.7

## 2012-05-15 MED ORDER — PHENOL 1.4 % MT LIQD
1.0000 | OROMUCOSAL | Status: DC | PRN
  Filled 2012-05-15: qty 177

## 2012-05-15 MED ORDER — MIDAZOLAM HCL 2 MG/2ML IJ SOLN: 1.0000 mg | INTRAMUSCULAR | Status: DC | PRN

## 2012-05-15 MED ORDER — PROPOFOL 10 MG/ML IV BOLUS
INTRAVENOUS | Status: DC | PRN
  Administered 2012-05-15: 50 mg via INTRAVENOUS
  Administered 2012-05-15: 150 mg via INTRAVENOUS

## 2012-05-15 MED ORDER — PROTAMINE SULFATE 10 MG/ML IV SOLN
INTRAVENOUS | Status: DC | PRN
  Administered 2012-05-15: 25 mg via INTRAVENOUS

## 2012-05-15 MED ORDER — ASPIRIN EC 81 MG PO TBEC
81.0000 mg | DELAYED_RELEASE_TABLET | Freq: Every day | ORAL | Status: DC
  Administered 2012-05-16: 81 mg via ORAL
  Filled 2012-05-15: qty 1

## 2012-05-15 MED ORDER — MUPIROCIN 2 % EX OINT
TOPICAL_OINTMENT | CUTANEOUS | Status: AC
  Administered 2012-05-15: 1 via NASAL
  Filled 2012-05-15: qty 22

## 2012-05-15 MED ORDER — ENSURE PO LIQD: 237.0000 mL | Freq: Every day | ORAL | Status: DC

## 2012-05-15 MED ORDER — FENTANYL CITRATE 0.05 MG/ML IJ SOLN
INTRAMUSCULAR | Status: DC | PRN
  Administered 2012-05-15 (×2): 50 ug via INTRAVENOUS

## 2012-05-15 MED ORDER — FENTANYL CITRATE 0.05 MG/ML IJ SOLN
25.0000 ug | INTRAMUSCULAR | Status: DC | PRN
  Administered 2012-05-15 (×3): 50 ug via INTRAVENOUS

## 2012-05-15 MED ORDER — PRO-STAT SUGAR FREE PO LIQD
30.0000 mL | Freq: Three times a day (TID) | ORAL | Status: DC
  Filled 2012-05-15 (×3): qty 30

## 2012-05-15 MED ORDER — FENTANYL CITRATE 0.05 MG/ML IJ SOLN
INTRAMUSCULAR | Status: AC
  Administered 2012-05-15: 50 ug via INTRAVENOUS
  Filled 2012-05-15: qty 2

## 2012-05-15 MED ORDER — ENSURE COMPLETE PO LIQD: 237.0000 mL | Freq: Every day | ORAL | Status: DC

## 2012-05-15 MED ORDER — SODIUM CHLORIDE 0.9 % IR SOLN
Status: DC | PRN
  Administered 2012-05-15: 20:00:00

## 2012-05-15 MED ORDER — HEPARIN SODIUM (PORCINE) 1000 UNIT/ML IJ SOLN
INTRAMUSCULAR | Status: DC | PRN
  Administered 2012-05-15: 4000 [IU] via INTRAVENOUS

## 2012-05-15 MED ORDER — GUAIFENESIN-DM 100-10 MG/5ML PO SYRP
15.0000 mL | ORAL_SOLUTION | ORAL | Status: DC | PRN
  Filled 2012-05-15: qty 15

## 2012-05-15 MED ORDER — ONDANSETRON HCL 4 MG/2ML IJ SOLN: 4.0000 mg | Freq: Four times a day (QID) | INTRAMUSCULAR | Status: DC | PRN

## 2012-05-15 MED ORDER — POTASSIUM CHLORIDE CRYS ER 20 MEQ PO TBCR: 20.0000 meq | EXTENDED_RELEASE_TABLET | Freq: Once | ORAL | Status: AC | PRN

## 2012-05-15 MED ORDER — SEVELAMER CARBONATE 800 MG PO TABS
800.0000 mg | ORAL_TABLET | Freq: Three times a day (TID) | ORAL | Status: DC
  Administered 2012-05-16: 800 mg via ORAL
  Filled 2012-05-15 (×4): qty 1

## 2012-05-15 MED ORDER — SODIUM CHLORIDE 0.9 % IV SOLN: 500.0000 mL | Freq: Once | INTRAVENOUS | Status: AC | PRN

## 2012-05-15 MED ORDER — TIOTROPIUM BROMIDE MONOHYDRATE 18 MCG IN CAPS
18.0000 ug | ORAL_CAPSULE | Freq: Every day | RESPIRATORY_TRACT | Status: DC
  Administered 2012-05-16: 18 ug via RESPIRATORY_TRACT
  Filled 2012-05-15: qty 5

## 2012-05-15 MED ORDER — FENTANYL CITRATE 0.05 MG/ML IJ SOLN: 50.0000 ug | Freq: Once | INTRAMUSCULAR | Status: DC

## 2012-05-15 MED ORDER — LORAZEPAM 0.5 MG PO TABS: 0.2500 mg | ORAL_TABLET | Freq: Three times a day (TID) | ORAL | Status: DC | PRN

## 2012-05-15 MED ORDER — HEMOSTATIC AGENTS (NO CHARGE) OPTIME
TOPICAL | Status: DC | PRN
  Administered 2012-05-15: 1 via TOPICAL

## 2012-05-15 MED ORDER — ACETAMINOPHEN 650 MG RE SUPP: 325.0000 mg | RECTAL | Status: DC | PRN

## 2012-05-15 MED ORDER — OXYCODONE-ACETAMINOPHEN 7.5-325 MG PO TABS: 1.0000 | ORAL_TABLET | ORAL | Status: DC | PRN

## 2012-05-15 MED ORDER — DOPAMINE-DEXTROSE 3.2-5 MG/ML-% IV SOLN
3.0000 ug/kg/min | INTRAVENOUS | Status: DC
  Filled 2012-05-15: qty 250

## 2012-05-15 MED ORDER — SENNOSIDES-DOCUSATE SODIUM 8.6-50 MG PO TABS
2.0000 | ORAL_TABLET | Freq: Every day | ORAL | Status: DC
  Administered 2012-05-15: 2 via ORAL
  Filled 2012-05-15 (×2): qty 2

## 2012-05-15 MED ORDER — DEXTROSE 5 % IV SOLN
1.5000 g | Freq: Two times a day (BID) | INTRAVENOUS | Status: DC
  Filled 2012-05-15: qty 1.5

## 2012-05-15 MED ORDER — SIMETHICONE 80 MG PO CHEW
80.0000 mg | CHEWABLE_TABLET | Freq: Every day | ORAL | Status: DC
  Administered 2012-05-16: 80 mg via ORAL
  Filled 2012-05-15: qty 1

## 2012-05-15 MED ORDER — LIDOCAINE HCL (CARDIAC) 20 MG/ML IV SOLN
INTRAVENOUS | Status: DC | PRN
  Administered 2012-05-15: 50 mg via INTRAVENOUS

## 2012-05-15 MED ORDER — METOPROLOL TARTRATE 1 MG/ML IV SOLN
2.0000 mg | INTRAVENOUS | Status: DC | PRN
  Filled 2012-05-15: qty 5

## 2012-05-15 MED ORDER — ACETAMINOPHEN 325 MG PO TABS: 650.0000 mg | ORAL_TABLET | Freq: Four times a day (QID) | ORAL | Status: DC | PRN

## 2012-05-15 MED ORDER — OXYCODONE HCL 5 MG PO TABS
5.0000 mg | ORAL_TABLET | Freq: Once | ORAL | Status: AC | PRN
  Administered 2012-05-15: 5 mg via ORAL

## 2012-05-15 MED ORDER — 0.9 % SODIUM CHLORIDE (POUR BTL) OPTIME
TOPICAL | Status: DC | PRN
  Administered 2012-05-15: 1000 mL

## 2012-05-15 SURGICAL SUPPLY — 38 items
CANISTER SUCTION 2500CC (MISCELLANEOUS) ×2 IMPLANT
CATH EMB 4FR 80CM (CATHETERS) ×2 IMPLANT
CATH EMB 5FR 80CM (CATHETERS) ×2 IMPLANT
CLIP TI MEDIUM 6 (CLIP) ×2 IMPLANT
CLIP TI WIDE RED SMALL 6 (CLIP) ×2 IMPLANT
CLOTH BEACON ORANGE TIMEOUT ST (SAFETY) ×2 IMPLANT
COVER SURGICAL LIGHT HANDLE (MISCELLANEOUS) ×2 IMPLANT
DERMABOND ADVANCED (GAUZE/BANDAGES/DRESSINGS) ×1
DERMABOND ADVANCED .7 DNX12 (GAUZE/BANDAGES/DRESSINGS) ×1 IMPLANT
ELECT REM PT RETURN 9FT ADLT (ELECTROSURGICAL) ×2
ELECTRODE REM PT RTRN 9FT ADLT (ELECTROSURGICAL) ×1 IMPLANT
GEL ULTRASOUND 20GR AQUASONIC (MISCELLANEOUS) IMPLANT
GLOVE BIO SURGEON STRL SZ7.5 (GLOVE) ×4 IMPLANT
GLOVE BIOGEL PI IND STRL 6.5 (GLOVE) ×1 IMPLANT
GLOVE BIOGEL PI IND STRL 7.5 (GLOVE) ×2 IMPLANT
GLOVE BIOGEL PI INDICATOR 6.5 (GLOVE) ×1
GLOVE BIOGEL PI INDICATOR 7.5 (GLOVE) ×2
GLOVE SURG SS PI 7.5 STRL IVOR (GLOVE) ×2 IMPLANT
GOWN PREVENTION PLUS XXLARGE (GOWN DISPOSABLE) ×2 IMPLANT
GOWN STRL NON-REIN LRG LVL3 (GOWN DISPOSABLE) ×4 IMPLANT
HEMOSTAT SNOW SURGICEL 2X4 (HEMOSTASIS) ×2 IMPLANT
HEMOSTAT SURGICEL 2X14 (HEMOSTASIS) IMPLANT
KIT BASIN OR (CUSTOM PROCEDURE TRAY) ×2 IMPLANT
KIT ROOM TURNOVER OR (KITS) ×2 IMPLANT
NS IRRIG 1000ML POUR BTL (IV SOLUTION) ×2 IMPLANT
PACK CV ACCESS (CUSTOM PROCEDURE TRAY) ×2 IMPLANT
PAD ARMBOARD 7.5X6 YLW CONV (MISCELLANEOUS) ×2 IMPLANT
SPONGE GAUZE 4X4 12PLY (GAUZE/BANDAGES/DRESSINGS) ×2 IMPLANT
SUT PROLENE 6 0 BV (SUTURE) ×2 IMPLANT
SUT VIC AB 2-0 CTB1 (SUTURE) ×2 IMPLANT
SUT VIC AB 3-0 SH 27 (SUTURE) ×1
SUT VIC AB 3-0 SH 27X BRD (SUTURE) ×1 IMPLANT
SUT VICRYL 4-0 PS2 18IN ABS (SUTURE) IMPLANT
TAPE CLOTH SURG 4X10 WHT LF (GAUZE/BANDAGES/DRESSINGS) ×2 IMPLANT
TOWEL OR 17X24 6PK STRL BLUE (TOWEL DISPOSABLE) ×2 IMPLANT
TOWEL OR 17X26 10 PK STRL BLUE (TOWEL DISPOSABLE) ×2 IMPLANT
UNDERPAD 30X30 INCONTINENT (UNDERPADS AND DIAPERS) ×2 IMPLANT
WATER STERILE IRR 1000ML POUR (IV SOLUTION) ×2 IMPLANT

## 2012-05-15 NOTE — Preoperative (Signed)
Beta Blockers   Reason not to administer Beta Blockers:Not Applicable 

## 2012-05-15 NOTE — Transfer of Care (Signed)
Immediate Anesthesia Transfer of Care Note  Patient: [name]  Procedure(s) Performed: Procedure(s) (LRB) with comments: THROMBECTOMY AND REVISION OF ARTERIOVENTOUS (AV) GORETEX  GRAFT (Left)  Patient Location: PACU  Anesthesia Type: General  Level of Consciousness: awake and alert   Airway & Oxygen Therapy: Patient Spontanous Breathing and Patient connected to nasal cannula oxygen  Post-op Assessment: Report given to PACU RN and Post -op Vital signs reviewed and stable  Post vital signs: Reviewed and stable  Complications: No apparent anesthesia complications

## 2012-05-15 NOTE — Op Note (Signed)
[facility]  Patient name: [name] MRN: [id number] DOB: Jul 30, 1956 Sex: male  05/15/2012 Pre-operative Diagnosis: Occluded left upper arm dialysis graft Post-operative diagnosis:  Same Surgeon:  Jorge Ny Assistants:  Narda Amber Procedure:   Simple thrombectomy left upper arm graft Anesthesia:  Gen. Blood Loss:  See anesthesia record Specimens:  None  Findings:  No evidence of venous hyperplasia. The venous anastomosis was widely patent. There was excellent arterial inflow.  Indications:  The patient is recently status post left upper arm thrombectomy and revision. He has reoccluded his graft. He comes in today for thrombectomy. He does report having several episodes of hypotension during dialysis.  Procedure:  The patient was identified in the holding area and taken to [facility] OR ROOM 12  The patient was then placed supine on the table. general anesthesia was administered.  The patient was prepped and draped in the usual sterile fashion.  A time out was called and antibiotics were administered.  The patient's previous incision was opened with a #10 blade. The graft was easily exposed. The graft to vein anastomosis was isolated. 4000 units of heparin were administered. A transverse graftotomy was made at the level of the venous anastomosis. I passed a #4 Fogarty catheter centrally and evacuated thrombus. This was done on 2 occasions without resistance. There was good backbleeding. The venous limb was then occluded with a vascular clamp. A #4 and #5 Fogarty catheter were advanced across the arterial anastomosis. The arterial plug was recovered. There was excellent inflow. The graft was then flushed with heparin saline and reoccluded. I inspected the venous anastomosis. This was widely patent. The graftotomy was closed in a transverse fashion with 6-0 Prolene. The clamps were released. There was an excellent thrill within the graft. The patient had a  palpable left radial pulse. 25 mg of protamine was administered. The tissue was reapproximated with a 203 0 Vicryl. 4-0 Vicryl was placed on the skin. The patient tolerated the procedure well there no complications.   Disposition:  To PACU in stable condition.   Juleen China, M.D. [facility] [place] Office: [phone] Pager:  [phone]

## 2012-05-15 NOTE — Anesthesia Procedure Notes (Signed)
Procedure Name: LMA Insertion Date/Time: 05/15/2012 7:38 PM Performed by: Arlice Colt B Pre-anesthesia Checklist: Patient identified, Emergency Drugs available, Suction available, Patient being monitored and Timeout performed Patient Re-evaluated:Patient Re-evaluated prior to inductionOxygen Delivery Method: Circle system utilized Preoxygenation: Pre-oxygenation with 100% oxygen Intubation Type: IV induction LMA: LMA inserted LMA Size: 4.0 Number of attempts: 1 Placement Confirmation: positive ETCO2 and breath sounds checked- equal and bilateral Tube secured with: Tape Dental Injury: Teeth and Oropharynx as per pre-operative assessment
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
## 2012-05-15 NOTE — Anesthesia Postprocedure Evaluation (Signed)
  Anesthesia Post-op Note  Patient: [name]  Procedure(s) Performed: Procedure(s) (LRB) with comments: THROMBECTOMY AND REVISION OF ARTERIOVENTOUS (AV) GORETEX  GRAFT (Left)  Patient Location: PACU  Anesthesia Type: General  Level of Consciousness: awake  Airway and Oxygen Therapy: Patient Spontanous Breathing  Post-op Pain: mild  Post-op Assessment: Post-op Vital signs reviewed, Patient's Cardiovascular Status Stable, Respiratory Function Stable, Patent Airway, No signs of Nausea or vomiting and Pain level controlled  Post-op Vital Signs: stable  Complications: No apparent anesthesia complications
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
## 2012-05-15 NOTE — Anesthesia Preprocedure Evaluation (Addendum)
Anesthesia Evaluation  Patient identified by MRN, date of birth, ID band Patient awake    Reviewed: Allergy & Precautions, H&P , NPO status , Patient's Chart, lab work & pertinent test results  History of Anesthesia Complications Negative for: history of anesthetic complications  Airway Mallampati: I TM Distance: >3 FB Neck ROM: Full    Dental  (+) Dental Advisory Given   Pulmonary asthma , pneumonia -, resolved, COPD COPD inhaler,    + decreased breath sounds      Cardiovascular hypertension, Pt. on medications + angina + dysrhythmias Rhythm:Regular Rate:Normal     Neuro/Psych    GI/Hepatic PUD, (+) Hepatitis -, C  Endo/Other    Renal/GU DialysisRenal disease     Musculoskeletal   Abdominal   Peds  Hematology   Anesthesia Other Findings   Reproductive/Obstetrics                         Anesthesia Physical Anesthesia Plan  ASA: III  Anesthesia Plan: General   Post-op Pain Management:    Induction: Intravenous  Airway Management Planned: LMA  Additional Equipment:   Intra-op Plan:   Post-operative Plan: Extubation in OR  Informed Consent: I have reviewed the patients History and Physical, chart, labs and discussed the procedure including the risks, benefits and alternatives for the proposed anesthesia with the patient or authorized representative who has indicated his/her understanding and acceptance.   Dental advisory given  Plan Discussed with: Surgeon and CRNA  Anesthesia Plan Comments:        Anesthesia Quick Evaluation

## 2012-05-16 MED ORDER — OXYCODONE HCL 5 MG PO TABS: 5.0000 mg | ORAL_TABLET | Freq: Four times a day (QID) | ORAL | Status: DC
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
## 2012-05-16 NOTE — Progress Notes (Signed)
[facility] of [place]  Subjective  - POD #1   Left upper arm graft thrombectomy.  Objective 121/69 82 98.9 F (37.2 C) (Oral) 20 91%  Intake/Output Summary (Last 24 hours) at 05/16/12 0830 Last data filed at 05/16/12 0600  Gross per 24 hour  Intake    760 ml  Output      0 ml  Net    760 ml    Palpable thrill left upper arm graft.  Radial pulse intact and good sensation without sign of steal. Dressing clean dry and in place over incision.  Assessment/Planning: POD #1 D/C to SNF he will get dialysis today at his usual place.    Clinton Gallant [facility] 05/16/2012 8:30 AM --  Laboratory Lab Results:  Basename 05/15/12 1356  WBC --  HGB 8.8*  HCT 26.0*  PLT --   BMET  Basename 05/15/12 1356  NA 138  K 4.7  CL 103  CO2 --  GLUCOSE 104*  BUN 45*  CREATININE 5.30*  CALCIUM --    COAG Lab Results  Component Value Date   INR 1.24 03/16/2012   INR 1.13 02/27/2012   INR 1.13 02/22/2012   No results found for this basename: PTT    Antibiotics Anti-infectives     Start     Dose/Rate Route Frequency Ordered Stop   05/16/12 0600   cefUROXime (ZINACEF) 1.5 g in dextrose 5 % 50 mL IVPB  Status:  Discontinued        1.5 g 100 mL/hr over 30 Minutes Intravenous Every 12 hours 05/15/12 2205 05/15/12 2313   05/16/12 0600   cefUROXime (ZINACEF) 1.5 g in dextrose 5 % 50 mL IVPB  Status:  Discontinued        1.5 g 100 mL/hr over 30 Minutes Intravenous Every 24 hours 05/15/12 2313 05/15/12 2328   05/15/12 1331   vancomycin (VANCOCIN) IVPB 1000 mg/200 mL premix        1,000 mg 200 mL/hr over 60 Minutes Intravenous 60 min pre-op 05/15/12 1331 05/15/12 1920

## 2012-05-17 NOTE — Discharge Summary (Signed)
I agree with the above. He'll continue to receive dialysis as an outpatient and be discharged to skilled nursing facility today  [facility]

## 2012-05-18 ENCOUNTER — Telehealth: Payer: Self-pay | Admitting: Vascular Surgery

## 2012-05-18 NOTE — Telephone Encounter (Signed)
Message copied by Fredrich Birks on Mon May 18, 2012 10:36 AM ------      Message from: Melene Plan      Created: Mon May 18, 2012 10:01 AM                   ----- Message -----         From: Lars Mage, PA         Sent: 05/16/2012   8:40 AM           To: Melene Plan, RN            Thrombectomy left arm graft f/u with Dr. Darrick Penna 4 weeks.

## 2012-05-18 NOTE — Telephone Encounter (Signed)
lvm for patient and sent letter, dpm

## 2012-05-25 ENCOUNTER — Emergency Department (HOSPITAL_COMMUNITY)
Admission: EM | Admit: 2012-05-25 | Discharge: 2012-05-25 | Disposition: A | Discharge status: Home / Self Care | Payer: Medicare Other | Attending: Vascular Surgery | Admitting: Vascular Surgery

## 2012-05-25 ENCOUNTER — Emergency Department (HOSPITAL_COMMUNITY): Payer: Medicare Other | Admitting: Critical Care Medicine

## 2012-05-25 ENCOUNTER — Encounter (HOSPITAL_COMMUNITY): Admission: EM | Disposition: A | Discharge status: Home / Self Care | Payer: Self-pay | Source: Home / Self Care

## 2012-05-25 ENCOUNTER — Encounter (HOSPITAL_COMMUNITY): Payer: Self-pay | Admitting: *Deleted

## 2012-05-25 ENCOUNTER — Encounter (HOSPITAL_COMMUNITY): Payer: Self-pay | Admitting: Neurology

## 2012-05-25 ENCOUNTER — Emergency Department (HOSPITAL_COMMUNITY): Payer: Medicare Other

## 2012-05-25 ENCOUNTER — Encounter (HOSPITAL_COMMUNITY): Payer: Self-pay | Admitting: Critical Care Medicine

## 2012-05-25 ENCOUNTER — Other Ambulatory Visit: Payer: Self-pay

## 2012-05-25 ENCOUNTER — Inpatient Hospital Stay: Admit: 2012-05-25 | Payer: Self-pay | Admitting: Vascular Surgery

## 2012-05-25 DIAGNOSIS — Z79899 Other long term (current) drug therapy: Secondary | ICD-10-CM | POA: Insufficient documentation

## 2012-05-25 DIAGNOSIS — I120 Hypertensive chronic kidney disease with stage 5 chronic kidney disease or end stage renal disease: Secondary | ICD-10-CM | POA: Insufficient documentation

## 2012-05-25 DIAGNOSIS — Z992 Dependence on renal dialysis: Secondary | ICD-10-CM | POA: Insufficient documentation

## 2012-05-25 DIAGNOSIS — J449 Chronic obstructive pulmonary disease, unspecified: Secondary | ICD-10-CM | POA: Insufficient documentation

## 2012-05-25 DIAGNOSIS — J4489 Other specified chronic obstructive pulmonary disease: Secondary | ICD-10-CM | POA: Insufficient documentation

## 2012-05-25 DIAGNOSIS — N186 End stage renal disease: Secondary | ICD-10-CM | POA: Insufficient documentation

## 2012-05-25 DIAGNOSIS — Z4901 Encounter for fitting and adjustment of extracorporeal dialysis catheter: Secondary | ICD-10-CM | POA: Insufficient documentation

## 2012-05-25 HISTORY — PX: INSERTION OF DIALYSIS CATHETER: SHX1324

## 2012-05-25 SURGERY — INSERTION OF DIALYSIS CATHETER
Anesthesia: Monitor Anesthesia Care | Wound class: Clean

## 2012-05-25 MED ORDER — VANCOMYCIN HCL IN DEXTROSE 1-5 GM/200ML-% IV SOLN
1000.0000 mg | INTRAVENOUS | Status: AC
  Administered 2012-05-25: 1000 mg via INTRAVENOUS

## 2012-05-25 MED ORDER — SODIUM CHLORIDE 0.9 % IR SOLN
Status: DC | PRN
  Administered 2012-05-25: 16:00:00

## 2012-05-25 MED ORDER — OXYCODONE HCL 5 MG PO TABS: 5.0000 mg | ORAL_TABLET | Freq: Once | ORAL | Status: DC | PRN

## 2012-05-25 MED ORDER — 0.9 % SODIUM CHLORIDE (POUR BTL) OPTIME
TOPICAL | Status: DC | PRN
  Administered 2012-05-25: 1000 mL

## 2012-05-25 MED ORDER — OXYCODONE HCL 5 MG/5ML PO SOLN: 5.0000 mg | Freq: Once | ORAL | Status: DC | PRN

## 2012-05-25 MED ORDER — PROPOFOL 10 MG/ML IV BOLUS
INTRAVENOUS | Status: DC | PRN
  Administered 2012-05-25: 20 mg via INTRAVENOUS

## 2012-05-25 MED ORDER — HEPARIN SODIUM (PORCINE) 1000 UNIT/ML IJ SOLN
INTRAMUSCULAR | Status: DC | PRN
  Administered 2012-05-25: 5000 [IU]

## 2012-05-25 MED ORDER — PROPOFOL INFUSION 10 MG/ML OPTIME
INTRAVENOUS | Status: DC | PRN
  Administered 2012-05-25: 75 ug/kg/min via INTRAVENOUS

## 2012-05-25 MED ORDER — OXYCODONE-ACETAMINOPHEN 5-325 MG PO TABS
ORAL_TABLET | ORAL | Status: AC
  Filled 2012-05-25: qty 2

## 2012-05-25 MED ORDER — ONDANSETRON HCL 4 MG/2ML IJ SOLN
INTRAMUSCULAR | Status: DC | PRN
  Administered 2012-05-25: 4 mg via INTRAVENOUS

## 2012-05-25 MED ORDER — LIDOCAINE-EPINEPHRINE 0.5 %-1:200000 IJ SOLN
INTRAMUSCULAR | Status: AC
  Filled 2012-05-25: qty 1

## 2012-05-25 MED ORDER — FENTANYL CITRATE 0.05 MG/ML IJ SOLN
INTRAMUSCULAR | Status: DC | PRN
  Administered 2012-05-25: 50 ug via INTRAVENOUS
  Administered 2012-05-25: 25 ug via INTRAVENOUS
  Administered 2012-05-25: 50 ug via INTRAVENOUS
  Administered 2012-05-25: 25 ug via INTRAVENOUS

## 2012-05-25 MED ORDER — MIDAZOLAM HCL 5 MG/5ML IJ SOLN
INTRAMUSCULAR | Status: DC | PRN
  Administered 2012-05-25: 2 mg via INTRAVENOUS

## 2012-05-25 MED ORDER — OXYCODONE-ACETAMINOPHEN 5-325 MG PO TABS: 2.0000 | ORAL_TABLET | ORAL | Status: DC | PRN

## 2012-05-25 MED ORDER — SODIUM CHLORIDE 0.9 % IV SOLN
INTRAVENOUS | Status: DC | PRN
  Administered 2012-05-25: 15:00:00 via INTRAVENOUS
  Administered 2012-05-25: 500 mL

## 2012-05-25 MED ORDER — SODIUM CHLORIDE 0.9 % IV SOLN: INTRAVENOUS | Status: DC

## 2012-05-25 MED ORDER — HEPARIN SODIUM (PORCINE) 1000 UNIT/ML IJ SOLN
INTRAMUSCULAR | Status: AC
  Filled 2012-05-25: qty 1

## 2012-05-25 MED ORDER — LIDOCAINE-EPINEPHRINE 0.5 %-1:200000 IJ SOLN
INTRAMUSCULAR | Status: DC | PRN
  Administered 2012-05-25: 4 mL

## 2012-05-25 MED ORDER — FENTANYL CITRATE 0.05 MG/ML IJ SOLN: 25.0000 ug | INTRAMUSCULAR | Status: DC | PRN

## 2012-05-25 MED ORDER — METOCLOPRAMIDE HCL 5 MG/ML IJ SOLN: 10.0000 mg | Freq: Once | INTRAMUSCULAR | Status: DC | PRN

## 2012-05-25 SURGICAL SUPPLY — 52 items
BAG DECANTER FOR FLEXI CONT (MISCELLANEOUS) ×3 IMPLANT
BENZOIN TINCTURE PRP APPL 2/3 (GAUZE/BANDAGES/DRESSINGS) ×3 IMPLANT
CANISTER SUCTION 2500CC (MISCELLANEOUS) ×3 IMPLANT
CATH CANNON HEMO 15F 50CM (CATHETERS) IMPLANT
CATH CANNON HEMO 15FR 19 (HEMODIALYSIS SUPPLIES) IMPLANT
CATH CANNON HEMO 15FR 23CM (HEMODIALYSIS SUPPLIES) IMPLANT
CATH CANNON HEMO 15FR 31CM (HEMODIALYSIS SUPPLIES) IMPLANT
CATH CANNON HEMO 15FR 32CM (HEMODIALYSIS SUPPLIES) ×3 IMPLANT
CATH EMB 4FR 80CM (CATHETERS) ×3 IMPLANT
CLIP LIGATING EXTRA MED SLVR (CLIP) ×3 IMPLANT
CLIP LIGATING EXTRA SM BLUE (MISCELLANEOUS) ×3 IMPLANT
CLOTH BEACON ORANGE TIMEOUT ST (SAFETY) ×3 IMPLANT
COVER PROBE W GEL 5X96 (DRAPES) IMPLANT
COVER SURGICAL LIGHT HANDLE (MISCELLANEOUS) ×3 IMPLANT
DECANTER SPIKE VIAL GLASS SM (MISCELLANEOUS) ×3 IMPLANT
DRAPE C-ARM 42X72 X-RAY (DRAPES) ×3 IMPLANT
DRAPE CHEST BREAST 15X10 FENES (DRAPES) ×3 IMPLANT
ELECT REM PT RETURN 9FT ADLT (ELECTROSURGICAL) ×3
ELECTRODE REM PT RTRN 9FT ADLT (ELECTROSURGICAL) ×2 IMPLANT
GAUZE SPONGE 2X2 8PLY STRL LF (GAUZE/BANDAGES/DRESSINGS) ×2 IMPLANT
GAUZE SPONGE 4X4 16PLY XRAY LF (GAUZE/BANDAGES/DRESSINGS) ×3 IMPLANT
GEL ULTRASOUND 20GR AQUASONIC (MISCELLANEOUS) IMPLANT
GLOVE SS BIOGEL STRL SZ 7.5 (GLOVE) ×2 IMPLANT
GLOVE SUPERSENSE BIOGEL SZ 7.5 (GLOVE) ×1
GOWN STRL NON-REIN LRG LVL3 (GOWN DISPOSABLE) ×9 IMPLANT
KIT BASIN OR (CUSTOM PROCEDURE TRAY) ×3 IMPLANT
KIT ROOM TURNOVER OR (KITS) ×3 IMPLANT
NEEDLE 18GX1X1/2 (RX/OR ONLY) (NEEDLE) ×3 IMPLANT
NEEDLE 22X1 1/2 (OR ONLY) (NEEDLE) ×3 IMPLANT
NEEDLE HYPO 25GX1X1/2 BEV (NEEDLE) ×3 IMPLANT
NS IRRIG 1000ML POUR BTL (IV SOLUTION) ×3 IMPLANT
PACK CV ACCESS (CUSTOM PROCEDURE TRAY) ×3 IMPLANT
PACK SURGICAL SETUP 50X90 (CUSTOM PROCEDURE TRAY) ×3 IMPLANT
PAD ARMBOARD 7.5X6 YLW CONV (MISCELLANEOUS) ×6 IMPLANT
SOAP 2 % CHG 4 OZ (WOUND CARE) ×3 IMPLANT
SPONGE GAUZE 2X2 STER 10/PKG (GAUZE/BANDAGES/DRESSINGS) ×1
SPONGE GAUZE 4X4 12PLY (GAUZE/BANDAGES/DRESSINGS) ×3 IMPLANT
STRIP CLOSURE SKIN 1/2X4 (GAUZE/BANDAGES/DRESSINGS) ×3 IMPLANT
SUT ETHILON 3 0 PS 1 (SUTURE) ×3 IMPLANT
SUT PROLENE 6 0 CC (SUTURE) ×3 IMPLANT
SUT VIC AB 3-0 SH 27 (SUTURE) ×1
SUT VIC AB 3-0 SH 27X BRD (SUTURE) ×2 IMPLANT
SUT VICRYL 4-0 PS2 18IN ABS (SUTURE) ×3 IMPLANT
SYR 20CC LL (SYRINGE) ×6 IMPLANT
SYR 30ML LL (SYRINGE) IMPLANT
SYR 5ML LL (SYRINGE) ×6 IMPLANT
SYR CONTROL 10ML LL (SYRINGE) ×3 IMPLANT
SYRINGE 10CC LL (SYRINGE) ×3 IMPLANT
TOWEL OR 17X24 6PK STRL BLUE (TOWEL DISPOSABLE) ×3 IMPLANT
TOWEL OR 17X26 10 PK STRL BLUE (TOWEL DISPOSABLE) ×3 IMPLANT
UNDERPAD 30X30 INCONTINENT (UNDERPADS AND DIAPERS) ×3 IMPLANT
WATER STERILE IRR 1000ML POUR (IV SOLUTION) ×3 IMPLANT

## 2012-05-25 NOTE — Preoperative (Signed)
Beta Blockers   Reason not to administer Beta Blockers:Not Applicable 

## 2012-05-25 NOTE — Anesthesia Procedure Notes (Signed)
Procedure Name: MAC Date/Time: 05/25/2012 3:40 PM Performed by: Elon Alas Pre-anesthesia Checklist: Patient identified, Timeout performed, Emergency Drugs available, Suction available and Patient being monitored Patient Re-evaluated:Patient Re-evaluated prior to inductionOxygen Delivery Method: Simple face mask Placement Confirmation: positive ETCO2 and breath sounds checked- equal and bilateral Dental Injury: Teeth and Oropharynx as per pre-operative assessment
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
## 2012-05-25 NOTE — ED Notes (Signed)
Per EMS- Pt gets dialysis M, W, F. Pt last dialyzed on Friday. Today went to dialysis and graft was clotted. Sent here for evaluation. 140/88, HR 100, 14 RR. Pt a x 4.

## 2012-05-25 NOTE — Op Note (Signed)
OPERATIVE REPORT  DATE OF SURGERY: 05/25/2012  PATIENT: [name], 56 y.o. male MRN: [id number]  DOB: [birth year]  PRE-OPERATIVE DIAGNOSIS: ESRD  POST-OPERATIVE DIAGNOSIS:  Same  PROCEDURE: Right IJ hemodialysis catheter with ultrasound visualization  SURGEON:  Gretta Began, M.D.  PHYSICIAN ASSISTANT: None  ANESTHESIA:  Local with sedation  EBL: Minimal ml  Total I/O In: 200 [I.V.:200] Out: -   BLOOD ADMINISTERED: None  DRAINS: None  SPECIMEN: None  COUNTS CORRECT:  YES  PLAN OF CARE: PACU with chest x-ray pending   PATIENT DISPOSITION:  PACU - hemodynamically stable  PROCEDURE DETAILS: Patient was placed in Trendelenburg position where the right and lack left neck was imaged revealing widely patent jugular veins bilaterally. The patient right and left neck and chest sterile fashion. Using local anesthesia and a finder needle the right internal jugular vein was accessed. Using Seldinger technique an 18-gauge needle a wire was passed to the level of the right atrium. The dilator and peel-away sheath was passed over the guidewire and the dilator and guidewire removed. The hemodialysis cath was passed through the peel-away sheath and positioned level distal right atrium. The catheter was brought through subcutaneous tunnel through a separate stab incision. A 2 lm ports were attached in both lumens flushed and aspirated easily and were locked with 1000 unit per cc heparin. The catheter was secured to the skin with a 3-0 nylon stitch and the entry site was closed with a 4-0 subcuticular Vicryl stitch. Sherryll Burger dressing was applied and the patient was taken to the recovery room a chest x-ray pending   Gretta Began, M.D. 05/25/2012 5:27 PM

## 2012-05-25 NOTE — ED Notes (Signed)
OR called saying pt was ready for OR. Pt transferred to OR.

## 2012-05-25 NOTE — Anesthesia Postprocedure Evaluation (Signed)
Anesthesia Post Note  Patient: [name]  Procedure(s) Performed: Procedure(s) (LRB): INSERTION OF DIALYSIS CATHETER (N/A)  Anesthesia type: general  Patient location: PACU  Post pain: Pain level controlled  Post assessment: Patient's Cardiovascular Status Stable  Last Vitals:  Filed Vitals:   05/25/12 1701  BP:   Pulse:   Temp:   Resp: 19    Post vital signs: Reviewed and stable  Level of consciousness: sedated  Complications: No apparent anesthesia complications

## 2012-05-25 NOTE — Anesthesia Preprocedure Evaluation (Addendum)
Anesthesia Evaluation  Patient identified by MRN, date of birth, ID band Patient awake    Reviewed: Allergy & Precautions, H&P , NPO status , Patient's Chart, lab work & pertinent test results  Airway Mallampati: I TM Distance: >3 FB Neck ROM: Full    Dental  (+) Dental Advisory Given, Edentulous Upper and Edentulous Lower   Pulmonary asthma , pneumonia -, COPD         Cardiovascular hypertension, Pt. on medications + angina     Neuro/Psych    GI/Hepatic PUD, (+)     substance abuse   , Hepatitis -, C  Endo/Other    Renal/GU Dialysis and ESRFRenal disease     Musculoskeletal   Abdominal   Peds  Hematology   Anesthesia Other Findings   Reproductive/Obstetrics                         Anesthesia Physical Anesthesia Plan  ASA: III  Anesthesia Plan: MAC   Post-op Pain Management:    Induction: Intravenous  Airway Management Planned: Simple Face Mask  Additional Equipment:   Intra-op Plan:   Post-operative Plan: Extubation in OR  Informed Consent: I have reviewed the patients History and Physical, chart, labs and discussed the procedure including the risks, benefits and alternatives for the proposed anesthesia with the patient or authorized representative who has indicated his/her understanding and acceptance.   Dental advisory given  Plan Discussed with: Anesthesiologist and Surgeon  Anesthesia Plan Comments:        Anesthesia Quick Evaluation

## 2012-05-25 NOTE — Interval H&P Note (Signed)
History and Physical Interval Note:  05/25/2012 3:26 PM  [name]  has presented today for surgery, with the diagnosis of renal failure  The various methods of treatment have been discussed with the patient and family. After consideration of risks, benefits and other options for treatment, the patient has consented to  Procedure(s) (LRB) with comments: THROMBECTOMY AND REVISION OF ARTERIOVENTOUS (AV) GORETEX  GRAFT (Left) INSERTION OF DIALYSIS CATHETER (N/A) as a surgical intervention .  The patient's history has been reviewed, patient examined, no change in status, stable for surgery.  I have reviewed the patient's chart and labs.  Questions were answered to the patient's satisfaction.   Re occlusion of graft.  Discussed options.  Reoccluded following T&R on Oct 7 and11.  Recommend HD cath and new R arm graft electively  EARLY, [name]

## 2012-05-25 NOTE — ED Notes (Signed)
Pt told registration that he was expected at short stay directly from the dialysis center. SS was called and clarified they are not expecting him.

## 2012-05-25 NOTE — Transfer of Care (Signed)
Immediate Anesthesia Transfer of Care Note  Patient: [name]  Procedure(s) Performed: Procedure(s) (LRB) with comments: INSERTION OF DIALYSIS CATHETER (N/A)  Patient Location: PACU  Anesthesia Type: MAC  Level of Consciousness: awake, alert , oriented and patient cooperative  Airway & Oxygen Therapy: Patient Spontanous Breathing  Post-op Assessment: Report given to PACU RN and Post -op Vital signs reviewed and stable  Post vital signs: Reviewed and stable  Complications: No apparent anesthesia complications

## 2012-05-26 ENCOUNTER — Telehealth: Payer: Self-pay | Admitting: Vascular Surgery

## 2012-05-26 ENCOUNTER — Encounter (HOSPITAL_COMMUNITY): Payer: Self-pay | Admitting: Vascular Surgery

## 2012-05-26 NOTE — Telephone Encounter (Addendum)
Message copied by Shari Prows on Tue May 26, 2012  3:57 PM ------      Message from: Melene Plan      Created: Tue May 26, 2012  9:42 AM       I guess Vein map and see Rusty????He will have to call in a MD but I guess easiest way to get on a schedule this week?      ----- Message -----         From: Larina Earthly, MD         Sent: 05/25/2012   5:30 PM           To: Reuel Derby, Melene Plan, RN            Patient had recurrent occlusion of his left upper arm AV Gore-Tex graft this is been worked on October 7 of October 11. He has a new hemodialysis catheter today this is placed with ultrasound visualization right IJ. He lives in a nursing home. He needs to have a right arm vein map in our office sometime this week if possible on nondialysis day and a new right arm access never to get him on the schedule within the next week        I scheduled an appt for this pt on 05/28/12 at 11:30am. Randie Heinz will arrange transportation for the pt (he resides @ [facility] [phone]) and I also left a vm on his ph. awt

## 2012-05-27 ENCOUNTER — Encounter: Payer: Self-pay | Admitting: Neurosurgery

## 2012-05-27 ENCOUNTER — Ambulatory Visit: Payer: Medicare Other | Admitting: Neurosurgery

## 2012-05-27 ENCOUNTER — Other Ambulatory Visit: Payer: Self-pay | Admitting: *Deleted

## 2012-05-27 DIAGNOSIS — Z01810 Encounter for preprocedural cardiovascular examination: Secondary | ICD-10-CM

## 2012-05-27 DIAGNOSIS — T82898A Other specified complication of vascular prosthetic devices, implants and grafts, initial encounter: Secondary | ICD-10-CM

## 2012-05-28 ENCOUNTER — Ambulatory Visit (INDEPENDENT_AMBULATORY_CARE_PROVIDER_SITE_OTHER): Payer: Medicare Other | Admitting: Neurosurgery

## 2012-05-28 ENCOUNTER — Encounter: Payer: Self-pay | Admitting: Neurosurgery

## 2012-05-28 ENCOUNTER — Encounter (INDEPENDENT_AMBULATORY_CARE_PROVIDER_SITE_OTHER): Payer: Medicare Other | Admitting: *Deleted

## 2012-05-28 VITALS — BP 114/68 | HR 81 | Resp 16 | Ht 72.0 in | Wt 135.0 lb

## 2012-05-28 DIAGNOSIS — N186 End stage renal disease: Secondary | ICD-10-CM

## 2012-05-28 DIAGNOSIS — T82898A Other specified complication of vascular prosthetic devices, implants and grafts, initial encounter: Secondary | ICD-10-CM

## 2012-05-28 DIAGNOSIS — Z01810 Encounter for preprocedural cardiovascular examination: Secondary | ICD-10-CM

## 2012-05-28 NOTE — Progress Notes (Signed)
Subjective:     Patient ID: [name], male   DOB: 26-Nov-1955, 56 y.o.   MRN: [id number]  HPI: 56 year old male patient seen for various access issues, last seen by Dr. Arbie Cookey 05/25/2012 when he presented for thrombectomy and revision of AV Gore-Tex graft in the left arm. The patient's had occlusion and came in today for vein mapping for other access options.   Review of Systems: 12 point review of systems is notable for the difficulties described above otherwise unremarkable     Objective:   Physical Exam: Afebrile, vital signs are stable, nonfunctioning left upper extremity graft.    Assessment:     Dr. Arbie Cookey spoke with the patient and reviewed his vein mapping with him, his best option at this point is to proceed with right upper extremity BVT    Plan:     The patient will be scheduled for the earliest available appointment for the basilic vein transposition, his followup here will be pending his procedure. The patients questions were encouraged and answered, he is in agreement with this plan. Sharlee Blew ANP  Clinic M.D.: Early

## 2012-06-02 ENCOUNTER — Other Ambulatory Visit: Payer: Self-pay

## 2012-06-02 LAB — POCT I-STAT 4, (NA,K, GLUC, HGB,HCT): Glucose, Bld: 92 mg/dL (ref 70–99)

## 2012-06-09 ENCOUNTER — Ambulatory Visit (HOSPITAL_COMMUNITY): Payer: Medicare Other | Admitting: Anesthesiology

## 2012-06-09 ENCOUNTER — Encounter (HOSPITAL_COMMUNITY): Payer: Self-pay | Admitting: *Deleted

## 2012-06-09 ENCOUNTER — Encounter (HOSPITAL_COMMUNITY): Payer: Self-pay | Admitting: Anesthesiology

## 2012-06-09 ENCOUNTER — Ambulatory Visit: Admit: 2012-06-09 | Payer: Self-pay | Admitting: Vascular Surgery

## 2012-06-09 ENCOUNTER — Encounter (HOSPITAL_COMMUNITY)
Admission: RE | Disposition: A | Discharge status: Home / Self Care | Payer: Self-pay | Source: Ambulatory Visit | Attending: Vascular Surgery

## 2012-06-09 ENCOUNTER — Ambulatory Visit (HOSPITAL_COMMUNITY)
Admission: RE | Admit: 2012-06-09 | Discharge: 2012-06-09 | Disposition: A | Discharge status: Home / Self Care | Payer: Medicare Other | Source: Ambulatory Visit | Attending: Vascular Surgery | Admitting: Vascular Surgery

## 2012-06-09 DIAGNOSIS — N186 End stage renal disease: Secondary | ICD-10-CM | POA: Insufficient documentation

## 2012-06-09 DIAGNOSIS — Z8711 Personal history of peptic ulcer disease: Secondary | ICD-10-CM | POA: Insufficient documentation

## 2012-06-09 DIAGNOSIS — B182 Chronic viral hepatitis C: Secondary | ICD-10-CM | POA: Insufficient documentation

## 2012-06-09 DIAGNOSIS — D61818 Other pancytopenia: Secondary | ICD-10-CM | POA: Insufficient documentation

## 2012-06-09 DIAGNOSIS — I120 Hypertensive chronic kidney disease with stage 5 chronic kidney disease or end stage renal disease: Secondary | ICD-10-CM | POA: Insufficient documentation

## 2012-06-09 DIAGNOSIS — I209 Angina pectoris, unspecified: Secondary | ICD-10-CM | POA: Insufficient documentation

## 2012-06-09 DIAGNOSIS — I499 Cardiac arrhythmia, unspecified: Secondary | ICD-10-CM | POA: Insufficient documentation

## 2012-06-09 DIAGNOSIS — Z992 Dependence on renal dialysis: Secondary | ICD-10-CM

## 2012-06-09 DIAGNOSIS — Z7982 Long term (current) use of aspirin: Secondary | ICD-10-CM | POA: Insufficient documentation

## 2012-06-09 DIAGNOSIS — J4489 Other specified chronic obstructive pulmonary disease: Secondary | ICD-10-CM | POA: Insufficient documentation

## 2012-06-09 DIAGNOSIS — Z79899 Other long term (current) drug therapy: Secondary | ICD-10-CM | POA: Insufficient documentation

## 2012-06-09 DIAGNOSIS — F1010 Alcohol abuse, uncomplicated: Secondary | ICD-10-CM | POA: Insufficient documentation

## 2012-06-09 DIAGNOSIS — J449 Chronic obstructive pulmonary disease, unspecified: Secondary | ICD-10-CM | POA: Insufficient documentation

## 2012-06-09 HISTORY — PX: AV FISTULA PLACEMENT: SHX1204

## 2012-06-09 LAB — POCT I-STAT, CHEM 8
BUN: 34 mg/dL — ABNORMAL HIGH (ref 6–23)
HCT: 36 % — ABNORMAL LOW (ref 39.0–52.0)
Hemoglobin: 12.2 g/dL — ABNORMAL LOW (ref 13.0–17.0)
Sodium: 135 mEq/L (ref 135–145)
TCO2: 23 mmol/L (ref 0–100)

## 2012-06-09 SURGERY — ARTERIOVENOUS (AV) FISTULA CREATION
Anesthesia: General | Site: Arm Lower | Laterality: Right | Wound class: Clean

## 2012-06-09 MED ORDER — ARTIFICIAL TEARS OP OINT
TOPICAL_OINTMENT | OPHTHALMIC | Status: DC | PRN
  Administered 2012-06-09: 1 via OPHTHALMIC

## 2012-06-09 MED ORDER — BUPIVACAINE HCL (PF) 0.5 % IJ SOLN
INTRAMUSCULAR | Status: AC
  Filled 2012-06-09: qty 30

## 2012-06-09 MED ORDER — HYDROMORPHONE HCL PF 1 MG/ML IJ SOLN
0.2500 mg | INTRAMUSCULAR | Status: DC | PRN
  Administered 2012-06-09: 0.5 mg via INTRAVENOUS

## 2012-06-09 MED ORDER — MUPIROCIN 2 % EX OINT
TOPICAL_OINTMENT | Freq: Two times a day (BID) | CUTANEOUS | Status: DC
  Administered 2012-06-09: 1 via NASAL

## 2012-06-09 MED ORDER — THROMBIN 20000 UNITS EX SOLR
CUTANEOUS | Status: AC
  Filled 2012-06-09: qty 20000

## 2012-06-09 MED ORDER — FENTANYL CITRATE 0.05 MG/ML IJ SOLN
INTRAMUSCULAR | Status: DC | PRN
  Administered 2012-06-09 (×2): 25 ug via INTRAVENOUS

## 2012-06-09 MED ORDER — MIDAZOLAM HCL 5 MG/5ML IJ SOLN
INTRAMUSCULAR | Status: DC | PRN
  Administered 2012-06-09: 2 mg via INTRAVENOUS

## 2012-06-09 MED ORDER — HYDROMORPHONE HCL PF 1 MG/ML IJ SOLN
INTRAMUSCULAR | Status: AC
  Filled 2012-06-09: qty 1

## 2012-06-09 MED ORDER — SODIUM CHLORIDE 0.9 % IV SOLN: INTRAVENOUS | Status: DC

## 2012-06-09 MED ORDER — 0.9 % SODIUM CHLORIDE (POUR BTL) OPTIME
TOPICAL | Status: DC | PRN
  Administered 2012-06-09: 1000 mL

## 2012-06-09 MED ORDER — PHENYLEPHRINE HCL 10 MG/ML IJ SOLN
INTRAMUSCULAR | Status: DC | PRN
  Administered 2012-06-09 (×2): 80 ug via INTRAVENOUS
  Administered 2012-06-09: 40 ug via INTRAVENOUS
  Administered 2012-06-09: 80 ug via INTRAVENOUS

## 2012-06-09 MED ORDER — OXYCODONE HCL 5 MG PO TABS: 5.0000 mg | ORAL_TABLET | ORAL | Status: DC | PRN

## 2012-06-09 MED ORDER — SODIUM CHLORIDE 0.9 % IR SOLN
Status: DC | PRN
  Administered 2012-06-09: 09:00:00

## 2012-06-09 MED ORDER — SODIUM CHLORIDE 0.9 % IV SOLN
INTRAVENOUS | Status: DC | PRN
  Administered 2012-06-09: 08:00:00 via INTRAVENOUS

## 2012-06-09 MED ORDER — LIDOCAINE-EPINEPHRINE (PF) 1 %-1:200000 IJ SOLN
INTRAMUSCULAR | Status: AC
  Filled 2012-06-09: qty 10

## 2012-06-09 MED ORDER — OXYCODONE HCL 5 MG/5ML PO SOLN: 5.0000 mg | Freq: Once | ORAL | Status: DC | PRN

## 2012-06-09 MED ORDER — LIDOCAINE HCL (CARDIAC) 20 MG/ML IV SOLN
INTRAVENOUS | Status: DC | PRN
  Administered 2012-06-09: 100 mg via INTRAVENOUS

## 2012-06-09 MED ORDER — OXYCODONE HCL 5 MG PO TABS: 5.0000 mg | ORAL_TABLET | Freq: Once | ORAL | Status: DC | PRN

## 2012-06-09 MED ORDER — PROPOFOL 10 MG/ML IV BOLUS
INTRAVENOUS | Status: DC | PRN
  Administered 2012-06-09: 180 mg via INTRAVENOUS

## 2012-06-09 MED ORDER — METOCLOPRAMIDE HCL 5 MG/ML IJ SOLN: 10.0000 mg | Freq: Once | INTRAMUSCULAR | Status: DC | PRN

## 2012-06-09 MED ORDER — MUPIROCIN 2 % EX OINT
TOPICAL_OINTMENT | CUTANEOUS | Status: AC
  Administered 2012-06-09: 1 via NASAL
  Filled 2012-06-09: qty 22

## 2012-06-09 MED ORDER — VANCOMYCIN HCL IN DEXTROSE 1-5 GM/200ML-% IV SOLN
1000.0000 mg | INTRAVENOUS | Status: AC
  Administered 2012-06-09: 1000 mg via INTRAVENOUS
  Filled 2012-06-09: qty 200

## 2012-06-09 MED ORDER — ONDANSETRON HCL 4 MG/2ML IJ SOLN
INTRAMUSCULAR | Status: DC | PRN
  Administered 2012-06-09: 4 mg via INTRAVENOUS

## 2012-06-09 MED ORDER — DEXAMETHASONE SODIUM PHOSPHATE 4 MG/ML IJ SOLN
INTRAMUSCULAR | Status: DC | PRN
  Administered 2012-06-09: 4 mg via INTRAVENOUS

## 2012-06-09 SURGICAL SUPPLY — 42 items
CANISTER SUCTION 2500CC (MISCELLANEOUS) ×3 IMPLANT
CLIP TI MEDIUM 24 (CLIP) ×3 IMPLANT
CLIP TI WIDE RED SMALL 24 (CLIP) ×3 IMPLANT
CLOTH BEACON ORANGE TIMEOUT ST (SAFETY) ×3 IMPLANT
CORDS BIPOLAR (ELECTRODE) IMPLANT
COVER PROBE W GEL 5X96 (DRAPES) ×3 IMPLANT
COVER SURGICAL LIGHT HANDLE (MISCELLANEOUS) ×3 IMPLANT
DECANTER SPIKE VIAL GLASS SM (MISCELLANEOUS) IMPLANT
DERMABOND ADVANCED (GAUZE/BANDAGES/DRESSINGS) ×1
DERMABOND ADVANCED .7 DNX12 (GAUZE/BANDAGES/DRESSINGS) ×2 IMPLANT
DRAIN PENROSE 1/2X12 LTX STRL (WOUND CARE) IMPLANT
ELECT REM PT RETURN 9FT ADLT (ELECTROSURGICAL) ×3
ELECTRODE REM PT RTRN 9FT ADLT (ELECTROSURGICAL) ×2 IMPLANT
GLOVE BIO SURGEON STRL SZ 6 (GLOVE) ×6 IMPLANT
GLOVE BIO SURGEON STRL SZ7 (GLOVE) ×3 IMPLANT
GLOVE BIOGEL PI IND STRL 6 (GLOVE) ×2 IMPLANT
GLOVE BIOGEL PI IND STRL 6.5 (GLOVE) ×2 IMPLANT
GLOVE BIOGEL PI IND STRL 7.0 (GLOVE) ×2 IMPLANT
GLOVE BIOGEL PI IND STRL 7.5 (GLOVE) ×2 IMPLANT
GLOVE BIOGEL PI IND STRL 8 (GLOVE) ×2 IMPLANT
GLOVE BIOGEL PI INDICATOR 6 (GLOVE) ×1
GLOVE BIOGEL PI INDICATOR 6.5 (GLOVE) ×1
GLOVE BIOGEL PI INDICATOR 7.0 (GLOVE) ×1
GLOVE BIOGEL PI INDICATOR 7.5 (GLOVE) ×1
GLOVE BIOGEL PI INDICATOR 8 (GLOVE) ×1
GOWN STRL NON-REIN LRG LVL3 (GOWN DISPOSABLE) ×9 IMPLANT
KIT BASIN OR (CUSTOM PROCEDURE TRAY) ×3 IMPLANT
KIT ROOM TURNOVER OR (KITS) ×3 IMPLANT
NS IRRIG 1000ML POUR BTL (IV SOLUTION) ×3 IMPLANT
PACK CV ACCESS (CUSTOM PROCEDURE TRAY) ×3 IMPLANT
PAD ARMBOARD 7.5X6 YLW CONV (MISCELLANEOUS) ×6 IMPLANT
SPONGE SURGIFOAM ABS GEL 100 (HEMOSTASIS) IMPLANT
SUT MNCRL AB 4-0 PS2 18 (SUTURE) ×3 IMPLANT
SUT PROLENE 6 0 BV (SUTURE) ×3 IMPLANT
SUT PROLENE 7 0 BV 1 (SUTURE) ×3 IMPLANT
SUT SILK 2 0 SH (SUTURE) IMPLANT
SUT VIC AB 3-0 SH 27 (SUTURE) ×1
SUT VIC AB 3-0 SH 27X BRD (SUTURE) ×2 IMPLANT
TOWEL OR 17X24 6PK STRL BLUE (TOWEL DISPOSABLE) ×6 IMPLANT
TOWEL OR 17X26 10 PK STRL BLUE (TOWEL DISPOSABLE) ×3 IMPLANT
UNDERPAD 30X30 INCONTINENT (UNDERPADS AND DIAPERS) ×3 IMPLANT
WATER STERILE IRR 1000ML POUR (IV SOLUTION) ×3 IMPLANT

## 2012-06-09 NOTE — Transfer of Care (Signed)
Immediate Anesthesia Transfer of Care Note  Patient: [name]  Procedure(s) Performed: Procedure(s) (LRB) with comments: ARTERIOVENOUS (AV) FISTULA CREATION (Right)  Patient Location: PACU  Anesthesia Type:General  Level of Consciousness: awake, alert  and oriented  Airway & Oxygen Therapy: Patient Spontanous Breathing and Patient connected to nasal cannula oxygen  Post-op Assessment: Report given to PACU RN, Post -op Vital signs reviewed and stable and Patient moving all extremities X 4  Post vital signs: Reviewed and stable  Complications: No apparent anesthesia complications

## 2012-06-09 NOTE — Op Note (Signed)
OPERATIVE NOTE   PROCEDURE: 1. right first stage basilic vein transposition (brachiobasilic arteriovenous fistula) placement  PRE-OPERATIVE DIAGNOSIS: end stage renal disease   POST-OPERATIVE DIAGNOSIS: same as above   SURGEON: Leonides Sake, MD  ASSISTANT(S): Della Goo, [facility]   ANESTHESIA: general  ESTIMATED BLOOD LOSS: 50 cc  FINDING(S): 1. Weakly palpable thrill in basilic vein 2. Dopplerable radial signal without significant augmentation with compression  SPECIMEN(S):  none  INDICATIONS:   [name] is a 56 y.o. male who presents with end stage renal disease.  The patient is scheduled for right first stage basilic vein transposition.  The patient is aware the risks include but are not limited to: bleeding, infection, steal syndrome, nerve damage, ischemic monomelic neuropathy, failure to mature, and need for additional procedures.  The patient is aware of the risks of the procedure and elects to proceed forward.  DESCRIPTION: After full informed written consent was obtained from the patient, the patient was brought back to the operating room and placed supine upon the operating table.  Prior to induction, the patient received IV antibiotics.   After obtaining adequate anesthesia, the patient was then prepped and draped in the standard fashion for a right arm access procedure.  I turned my attention first to identifying the patient's basilic vein and brachial artery.  Using SonoSite guidance, the location of these vessels were marked out on the skin.   I made a transverse incision at the level of the antecubitum and dissected through the subcutaneous tissue and fascia to gain exposure of the brachial artery.  This was noted to be 3 mm in diameter externally.  This was dissected out proximally and distally and controlled with vessel loops .  I then dissected out the basilic vein.  This was noted to be 3 mm in diameter externally.  The distal segment of the vein was ligated with  a  2-0 silk, and the vein was transected.  The proximal segment was iinterrogated with serial dilators.  The vein accepted up to a 3.5 mm dilator without any difficulty.  I then instilled the heparinized saline into the vein and clamped it.  At this point, I reset my exposure of the brachial artery and placed the artery under tension proximally and distally.  I made an arteriotomy with a #11 blade, and then I extended the arteriotomy with a Potts scissor.  I injected heparinized saline proximal and distal to this arteriotomy.  The vein was then sewn to the artery in an end-to-side configuration with a running stitch of 7-0 Prolene.  Prior to completing this anastomosis, I allowed the vein and artery to backbleed.  There was no evidence of clot from any vessels.  I completed the anastomosis in the usual fashion and then released all vessel loops and clamps.  There was a weakly  palpable  thrill in the venous outflow, and there was dopplerable radial signal that does not augment with venous outflow compression.  At this point, I irrigated out the surgical wound.  There was no further active bleeding.  The subcutaneous tissue was reapproximated with a running stitch of 3-0 Vicryl.  The skin was then reapproximated with a running subcuticular stitch of 4-0 Vicryl.  The skin was then cleaned, dried, and reinforced with Dermabond.  The patient tolerated this procedure well.   COMPLICATIONS: none  CONDITION: stable  Leonides Sake, MD [facility] [place] Office: [phone] Pager: [phone]  06/09/2012, 9:35 AM
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
## 2012-06-09 NOTE — Anesthesia Postprocedure Evaluation (Signed)
Anesthesia Post Note  Patient: [name]  Procedure(s) Performed: Procedure(s) (LRB): ARTERIOVENOUS (AV) FISTULA CREATION (Right)  Anesthesia type: General  Patient location: PACU  Post pain: Pain level controlled  Post assessment: Patient's Cardiovascular Status Stable  Last Vitals:  Filed Vitals:   06/09/12 1030  BP: 120/72  Pulse: 69  Temp:   Resp: 14    Post vital signs: Reviewed and stable  Level of consciousness: alert  Complications: No apparent anesthesia complications

## 2012-06-09 NOTE — Anesthesia Preprocedure Evaluation (Addendum)
Anesthesia Evaluation  Patient identified by MRN, date of birth, ID band Patient awake    Reviewed: Allergy & Precautions, H&P , NPO status , Patient's Chart, lab work & pertinent test results, reviewed documented beta blocker date and time   Airway Mallampati: II TM Distance: >3 FB Neck ROM: full    Dental  (+) Dental Advisory Given   Pulmonary asthma , resolved, COPDCurrent Smoker,  breath sounds clear to auscultation        Cardiovascular hypertension, On Medications + angina negative cardio ROS  + dysrhythmias Rhythm:regular     Neuro/Psych negative neurological ROS  negative psych ROS   GI/Hepatic PUD, (+)     substance abuse   , Hepatitis -, C  Endo/Other  negative endocrine ROS  Renal/GU ESRFRenal disease  negative genitourinary   Musculoskeletal   Abdominal   Peds  Hematology negative hematology ROS (+)   Anesthesia Other Findings See surgeon's H&P   Reproductive/Obstetrics negative OB ROS                          Anesthesia Physical Anesthesia Plan  ASA: IV  Anesthesia Plan: General   Post-op Pain Management:    Induction: Intravenous  Airway Management Planned: Oral ETT  Additional Equipment:   Intra-op Plan:   Post-operative Plan: Extubation in OR  Informed Consent: I have reviewed the patients History and Physical, chart, labs and discussed the procedure including the risks, benefits and alternatives for the proposed anesthesia with the patient or authorized representative who has indicated his/her understanding and acceptance.   Dental Advisory Given  Plan Discussed with: CRNA and Surgeon  Anesthesia Plan Comments:         Anesthesia Quick Evaluation

## 2012-06-09 NOTE — Progress Notes (Signed)
No record found of previous treatment for positive PCR.Started today and will needd to continue for 9 more doses.

## 2012-06-09 NOTE — Preoperative (Signed)
Beta Blockers   Reason not to administer Beta Blockers:Not Applicable 

## 2012-06-10 ENCOUNTER — Encounter (HOSPITAL_COMMUNITY): Payer: Self-pay | Admitting: Vascular Surgery

## 2012-06-11 ENCOUNTER — Telehealth: Payer: Self-pay | Admitting: Vascular Surgery

## 2012-06-11 NOTE — Telephone Encounter (Signed)
Message copied by Margaretmary Eddy on Thu Jun 11, 2012  4:49 PM ------      Message from: Melene Plan      Created: Tue Jun 09, 2012 10:54 AM                   ----- Message -----         From: Fransisco Hertz, MD         Sent: 06/09/2012   9:37 AM           To: Reuel Derby, Melene Plan, RN            EDKER PUNT      [phone]      July 27, 1956            PROCEDURE:      right first stage basilic vein transposition (brachiobasilic arteriovenous fistula) placement            Asst: Della Goo, [facility]            Follow-up: 4 weeks

## 2012-06-18 ENCOUNTER — Ambulatory Visit: Payer: Medicare Other | Admitting: Vascular Surgery

## 2012-06-25 IMAGING — CR DG CHEST 1V
1 series · 1 of 1 positions shown · non-contrast
Comparison: 10/24/2011

CLINICAL DATA: Syncope.  Fall.  Pain.

CHEST - 1 VIEW

[view not recorded]
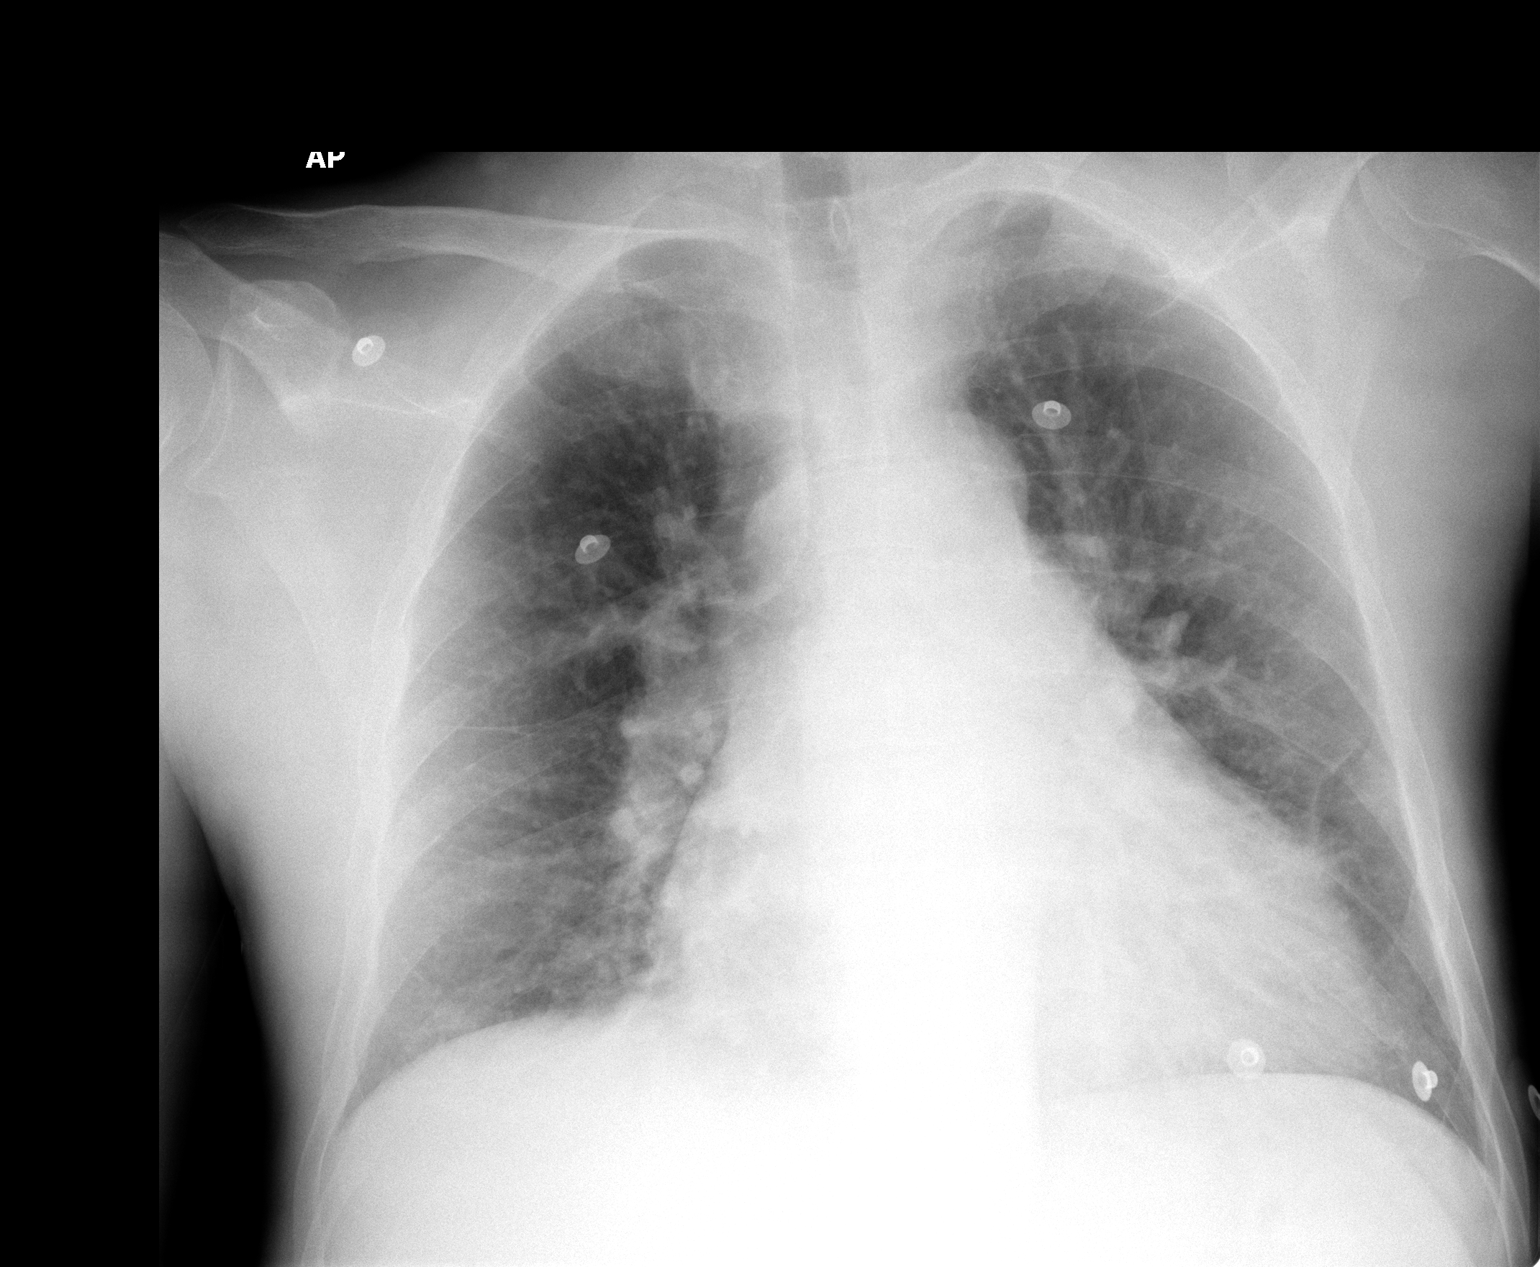

[1 of 1 positions shown; findings below may reference images not displayed]

FINDINGS: Cardiomegaly with cephalization of blood flow noted,
compatible with pulmonary venous hypertension.

No pneumothorax is observed.  Indistinct density along the lingula
probably represents the scarring shown on prior chest CT.

No pneumothorax or pulmonary contusion observed.
IMPRESSION: 1.  Cardiomegaly and pulmonary venous hypertension.
2.  Mild lingular scarring.

## 2012-06-25 IMAGING — CR DG FOREARM 2V*L*
2 series · 2 of 2 positions shown · non-contrast
Comparison: None.

CLINICAL DATA: Syncope.  Fall.  Left forearm pain and bruising
distally.

LEFT FOREARM - 2 VIEW

[x forearm ap left]
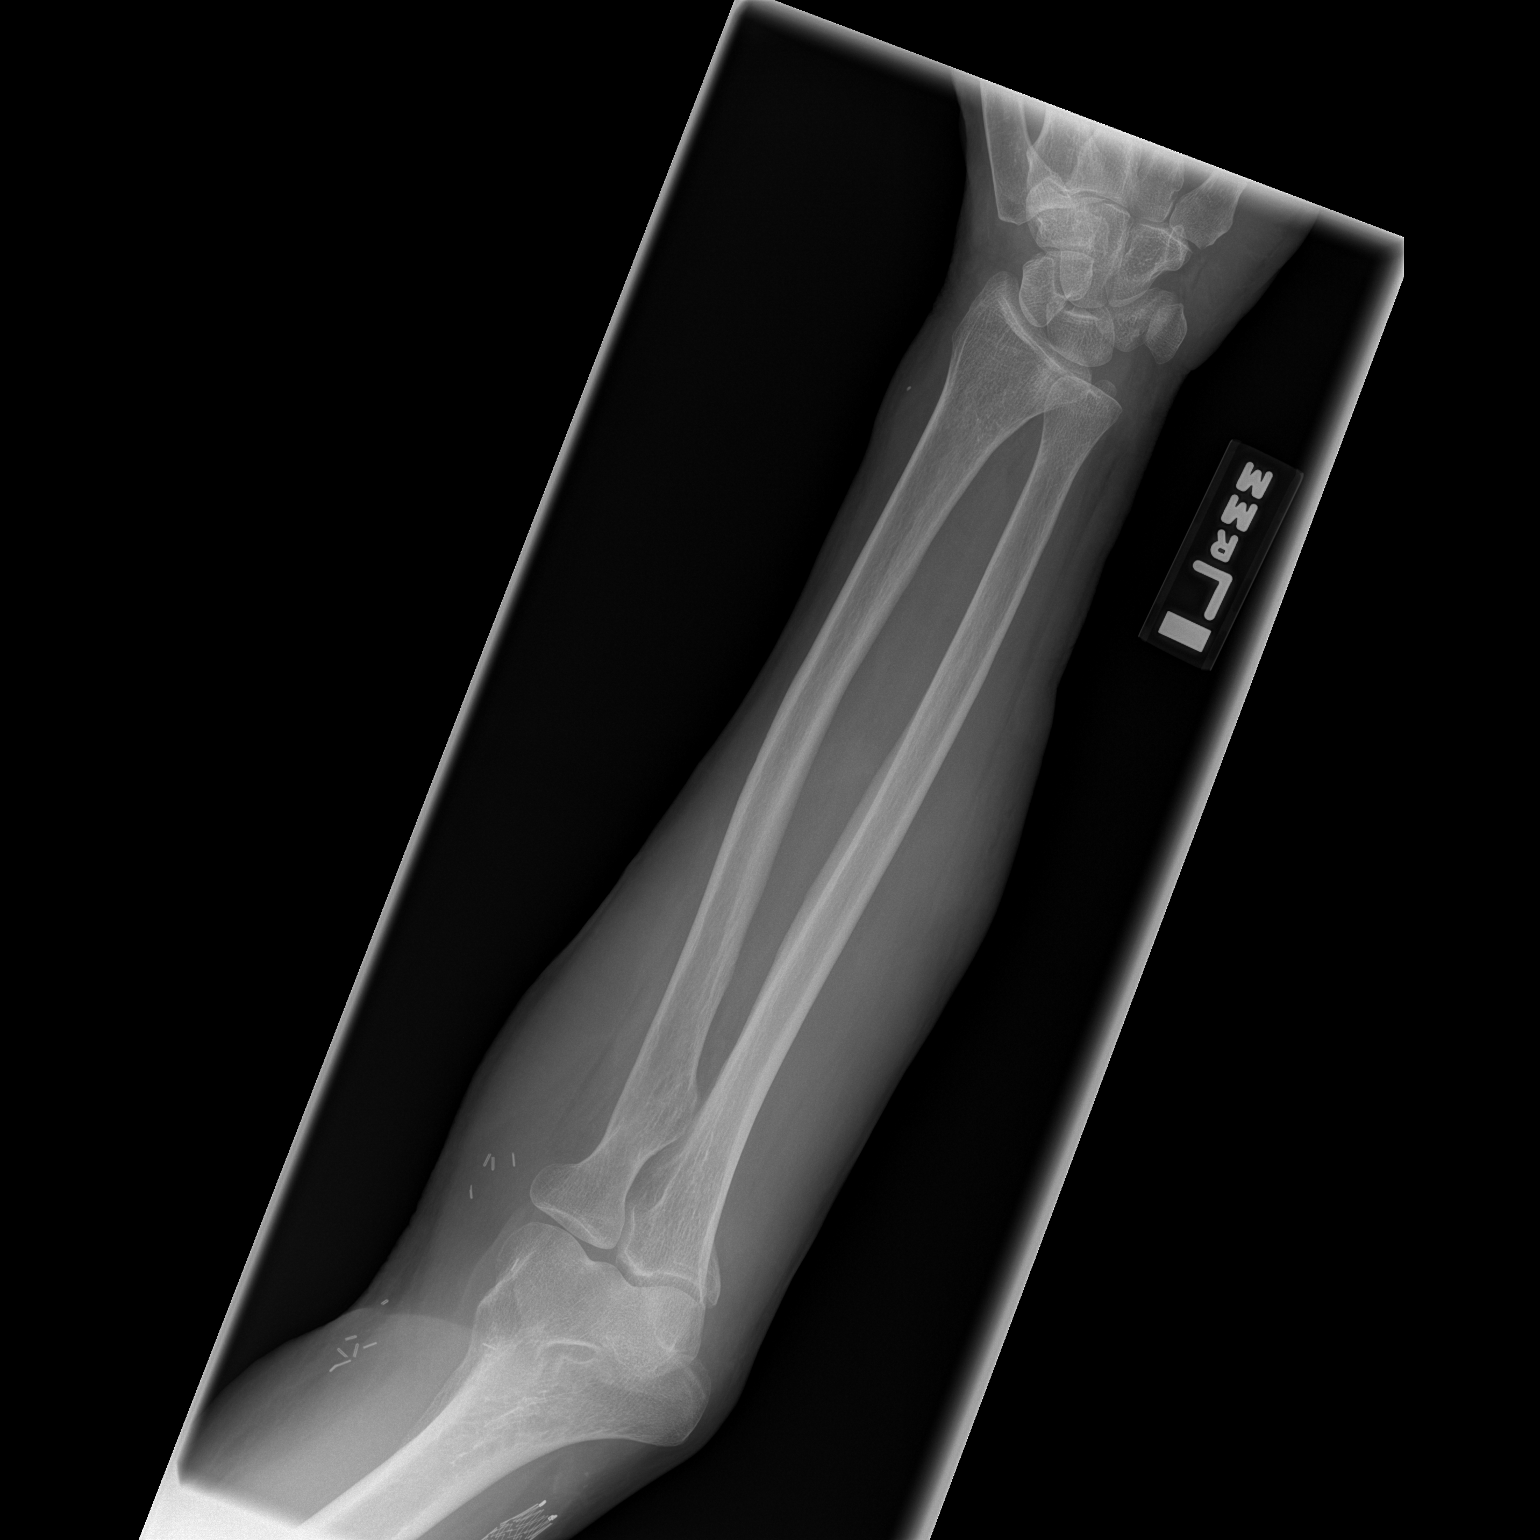

[x forearm lat left]
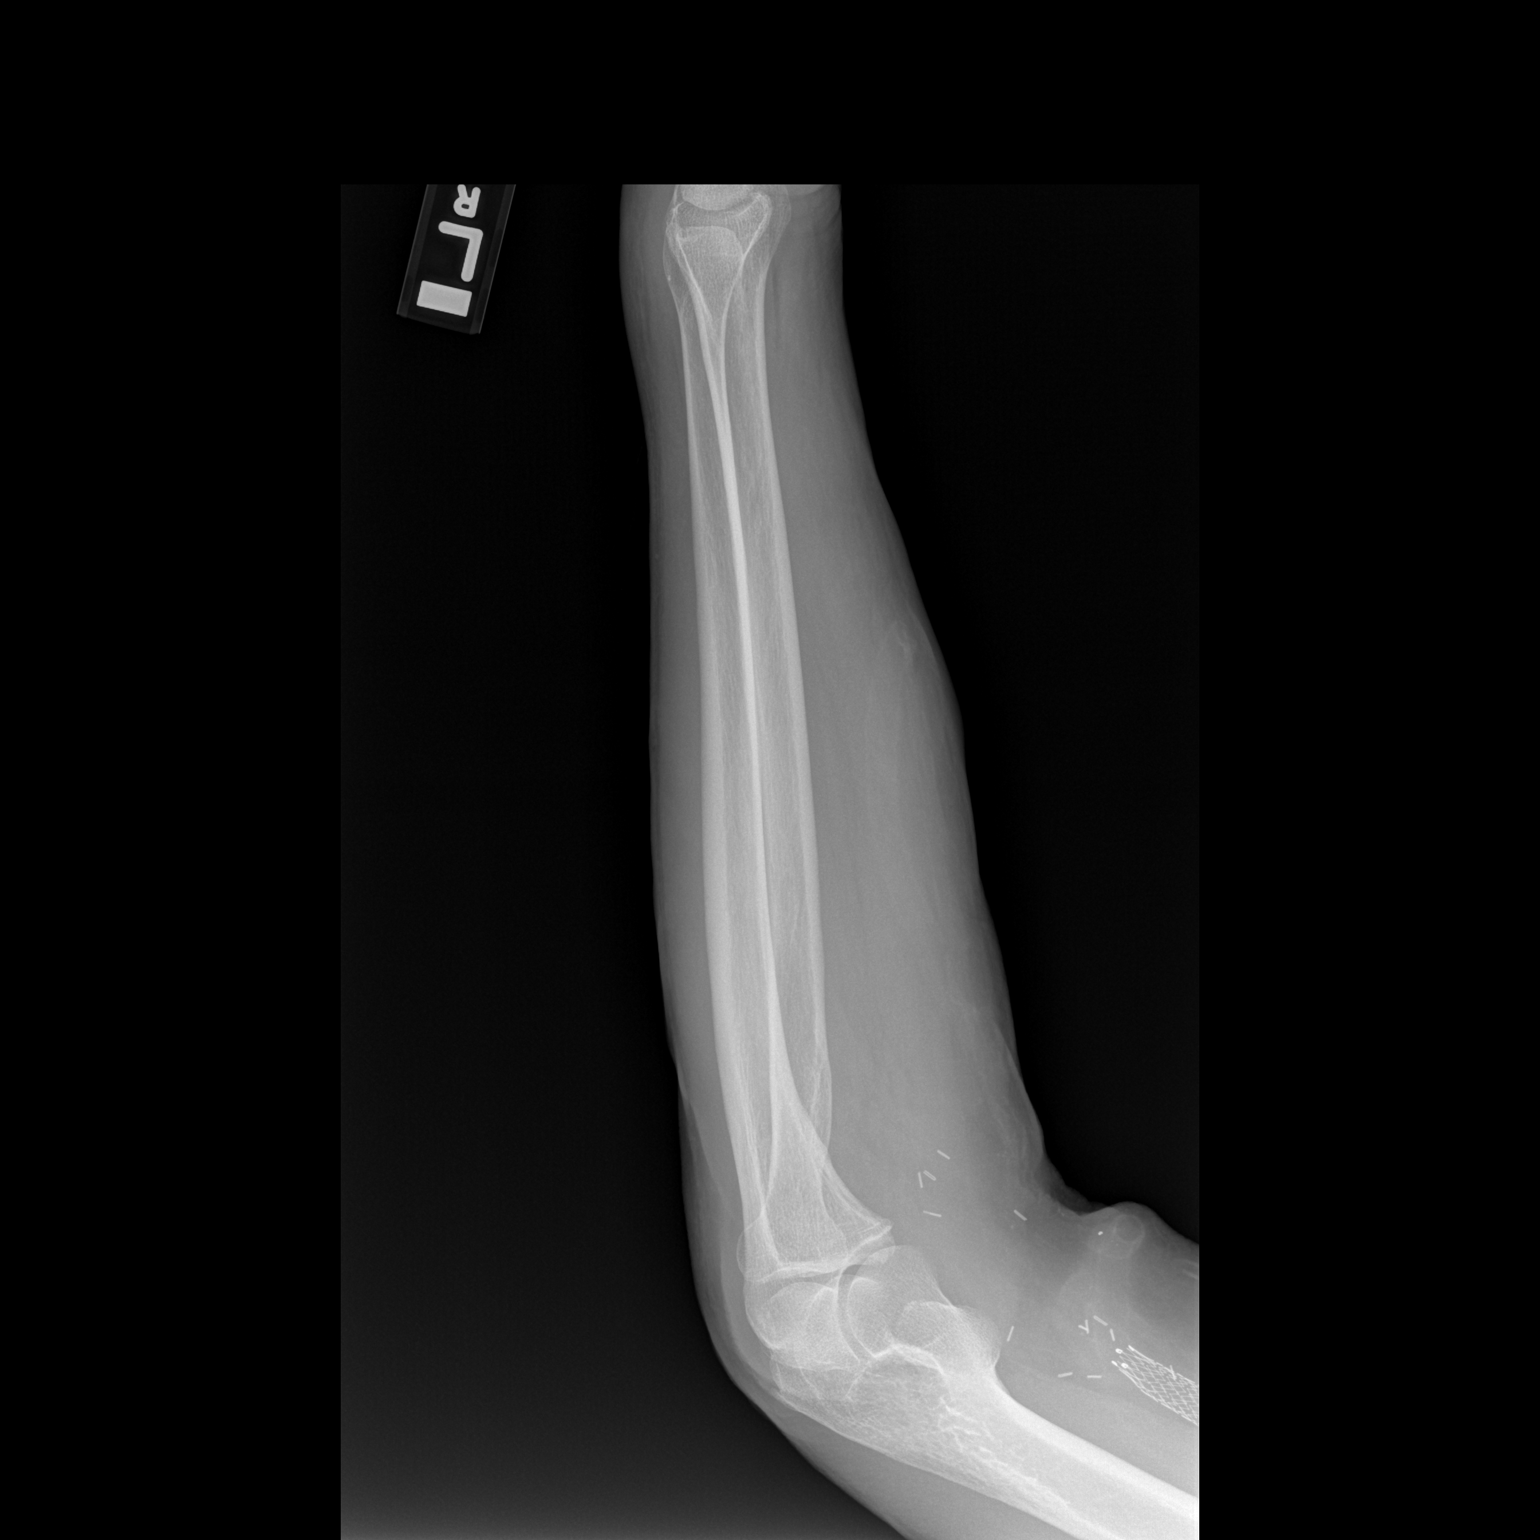

[2 of 2 positions shown; findings below may reference images not displayed]

FINDINGS: Vascular clips and stent projects in the vicinity of the
elbow.

Subcutaneous swelling noted along the ulnar side of the mid
forearm.  Vascular shunt noted.

No fracture is observed.
IMPRESSION: 1.  Dialysis graft/shunt in the forearm.
2.  Soft tissue swelling medially.
3.  No fracture or acute bony findings are observed.

## 2012-06-25 IMAGING — CT CT HEAD W/O CM
3 of 6 series · 15 of 47 positions shown, 18 images · non-contrast
Comparison: None.

CT HEAD

CLINICAL DATA: Fall, generalized weakness

CT HEAD WITHOUT CONTRAST
CT CERVICAL SPINE WITHOUT CONTRAST
TECHNIQUE: Multidetector CT imaging of the head and cervical spine
was performed following the standard protocol without intravenous
contrast.  Multiplanar CT image reconstructions of the cervical
spine were also generated.

[Series 5: soft tissue · axial · 0.26mm/px · z∈[+160,+350]mm · 9 of 119 slices shown, 12 images]
[im 12/119  brain]
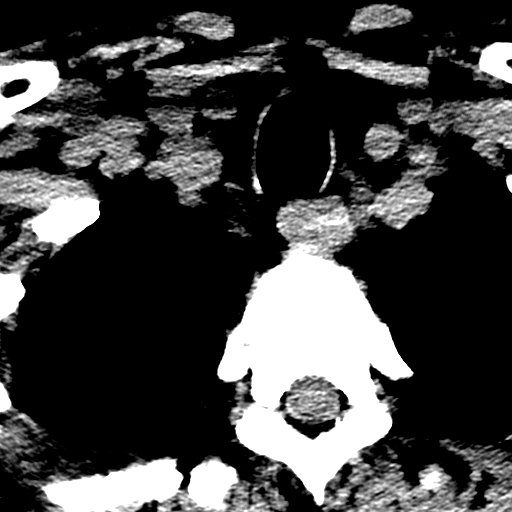
[im 12/119  bone]
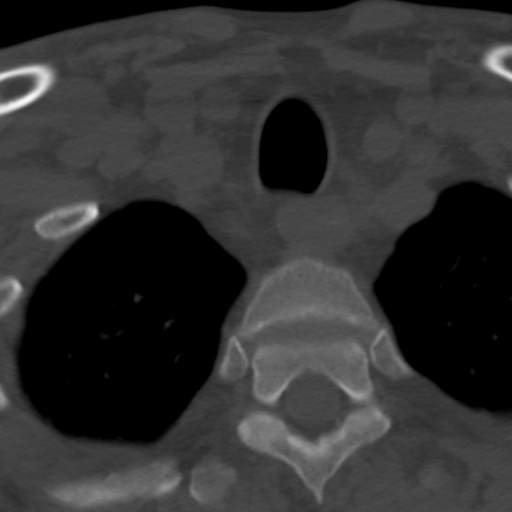
[im 24/119  brain]
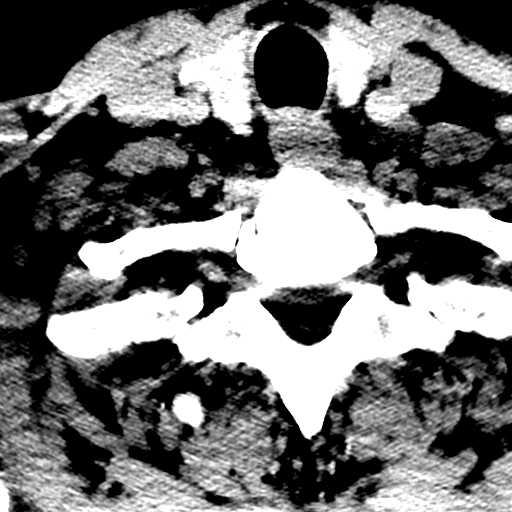
[im 36/119  brain]
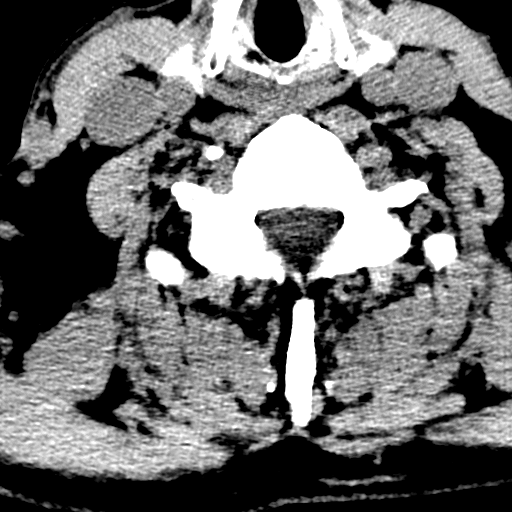
[im 48/119  brain]
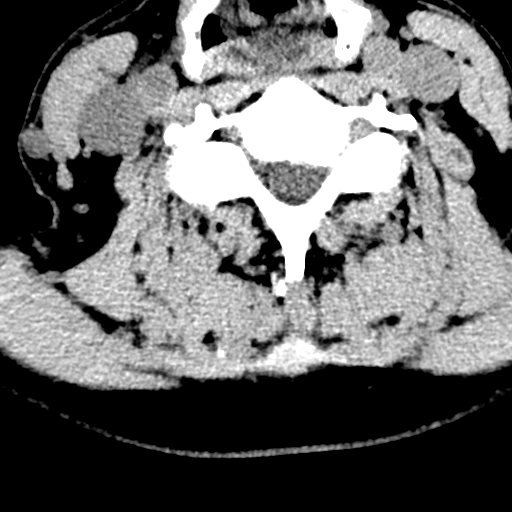
[im 60/119  brain]
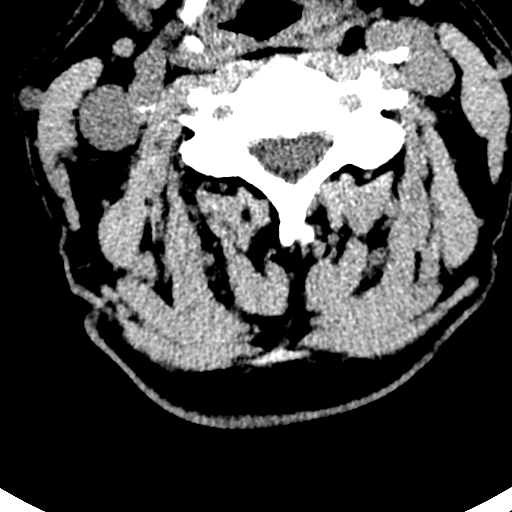
[im 60/119  bone]
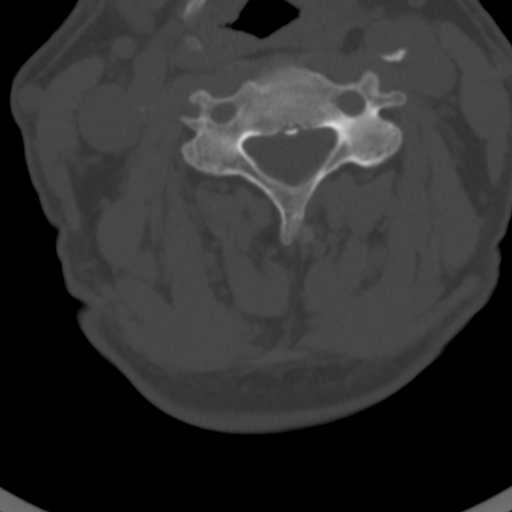
[im 71/119  brain]
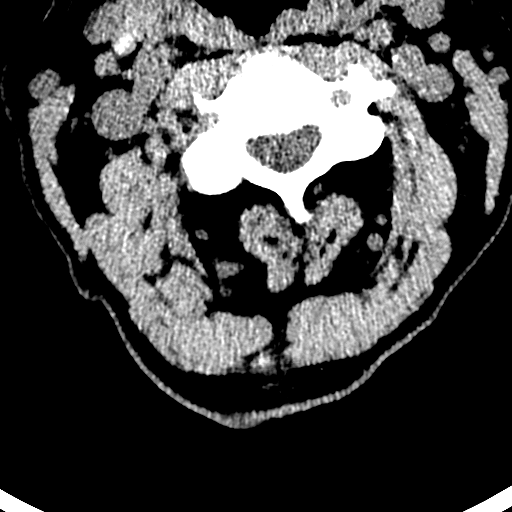
[im 83/119  brain]
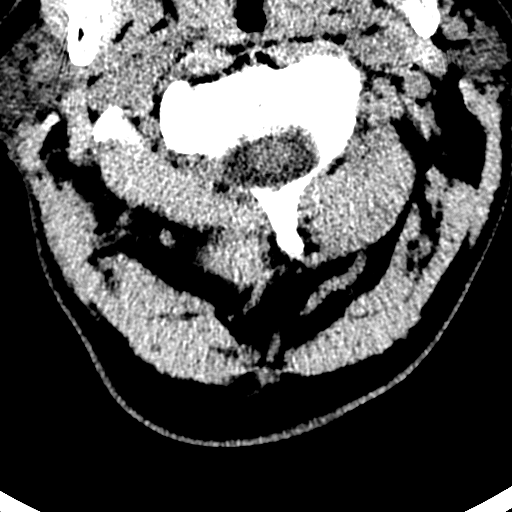
[im 95/119  brain]
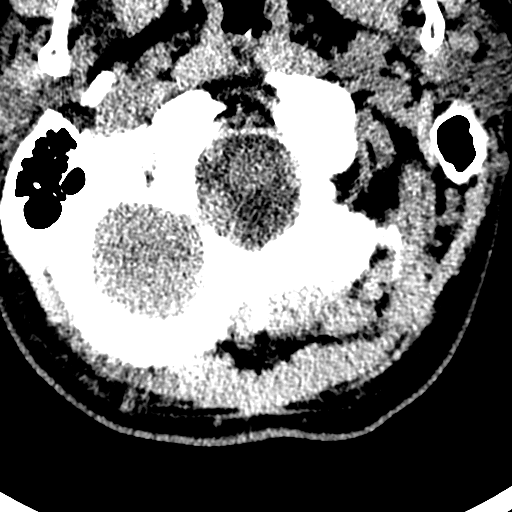
[im 107/119  brain]
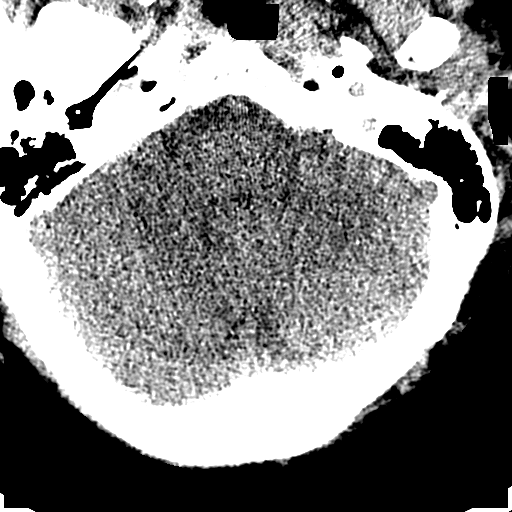
[im 107/119  bone]
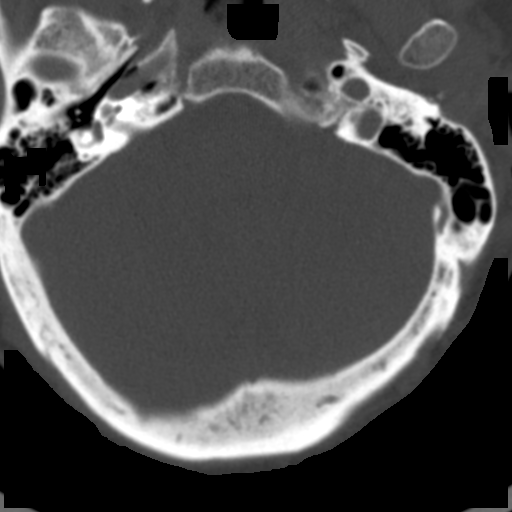

[cor · coronal · 0.46mm/px · 3 of 38 slices shown]
[im 13/38  brain]
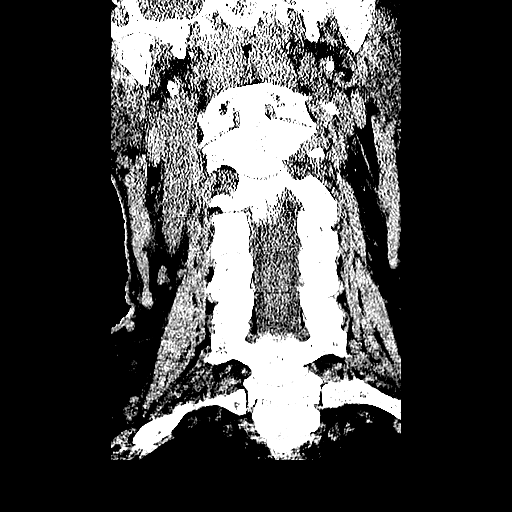
[im 17/38  brain]
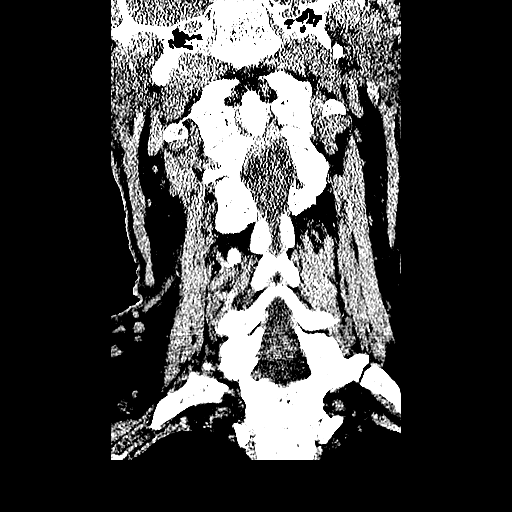
[im 21/38  brain]
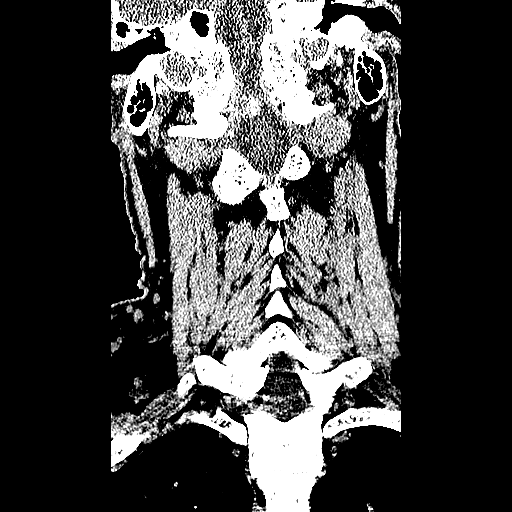

[sag · sagittal · 0.46mm/px · 3 of 47 slices shown]
[im 16/47  brain]
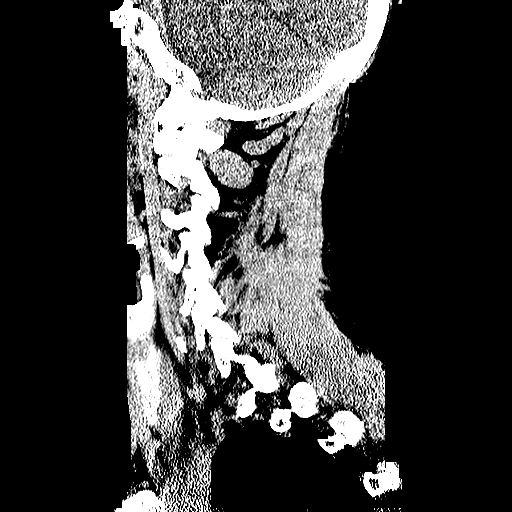
[im 24/47  brain]
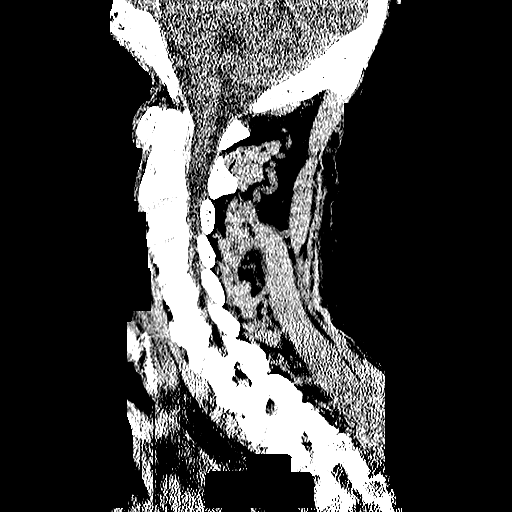
[im 31/47  brain]
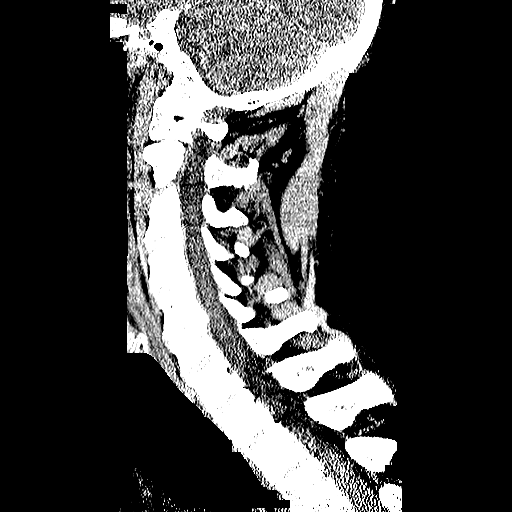

[15 of 47 positions shown; findings below may reference images not displayed]

FINDINGS: No evidence of parenchymal hemorrhage or extra-axial
fluid collection. No mass lesion, mass effect, or midline shift.

No CT evidence of acute infarction.

Cortical and central atrophy with secondary ventricular prominence.

The visualized paranasal sinuses are essentially clear. The mastoid
air cells are unopacified.

No evidence of calvarial fracture.
IMPRESSION: No evidence of acute intracranial abnormality.

Atrophy with secondary ventricular prominence.

CT CERVICAL SPINE
FINDINGS: Normal cervical lordosis.

Evidence of fracture or dislocation.  Vertebral body heights are
maintained.  The dens appears intact.

No prevertebral soft tissue swelling.

Mild multilevel degenerative changes.

Visualized thyroid is unremarkable.

Visualized lung apices are clear.

Small bilateral cervical, supraclavicular, and upper mediastinal
lymph nodes which do not meet pathologic CT size criteria.
IMPRESSION: No evidence of traumatic injury to the cervical spine.

Mild multilevel degenerative changes.

## 2012-06-26 IMAGING — US US ABDOMEN COMPLETE
1 series · 13 of 25 positions shown · non-contrast
Comparison: Abdominal ultrasound 07/14/2011

CLINICAL DATA: Abdominal pain, status post fall.  End-stage renal
disease, hepatitis C, alcohol use, dialysis.

ABDOMINAL ULTRASOUND COMPLETE

[Series 1: us abdomen complete · 0.31mm/px · 13 of 72 slices shown]
[im 1/72]
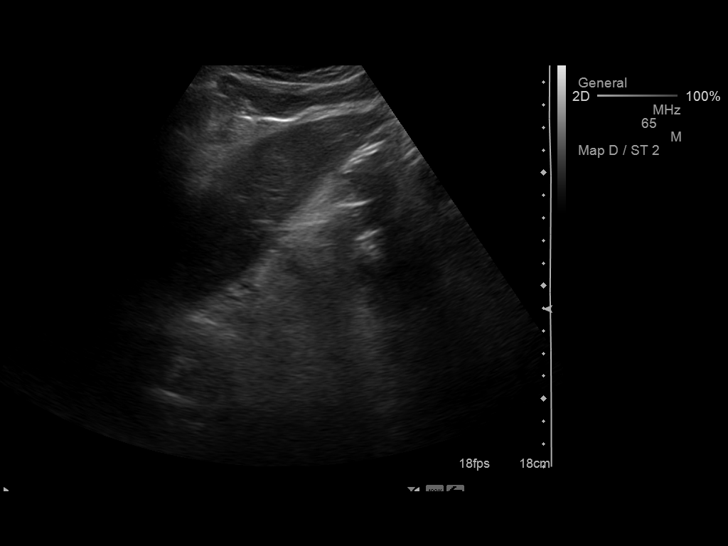
[im 6/72]
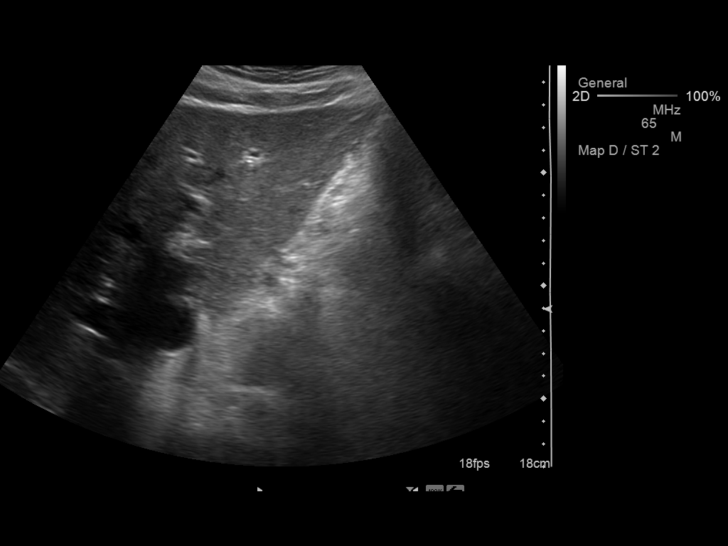
[im 12/72]
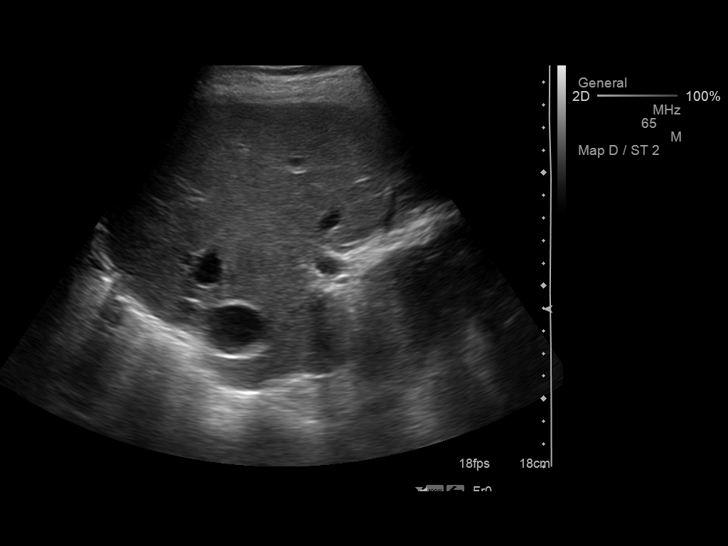
[im 18/72]
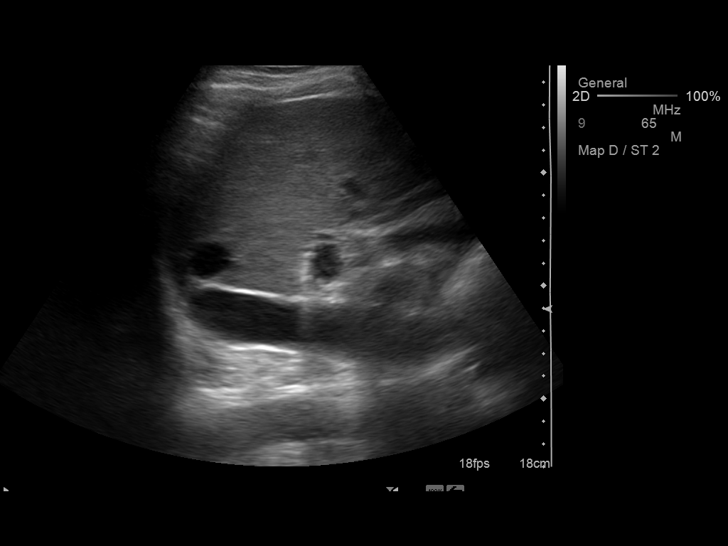
[im 24/72]
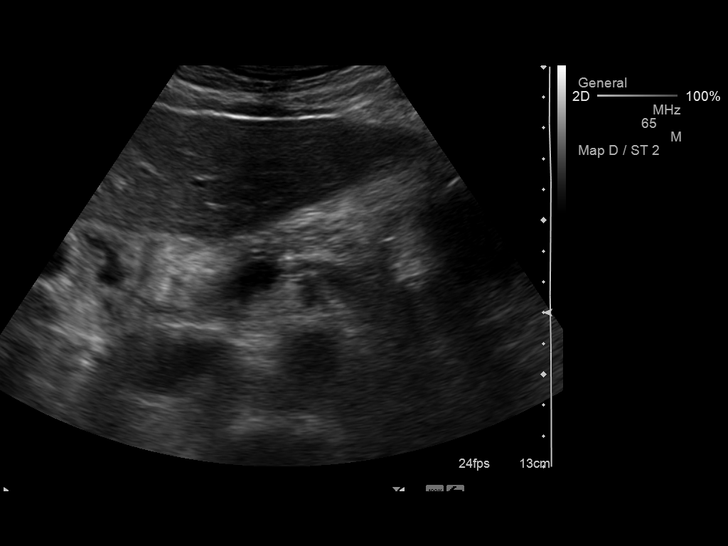
[im 30/72]
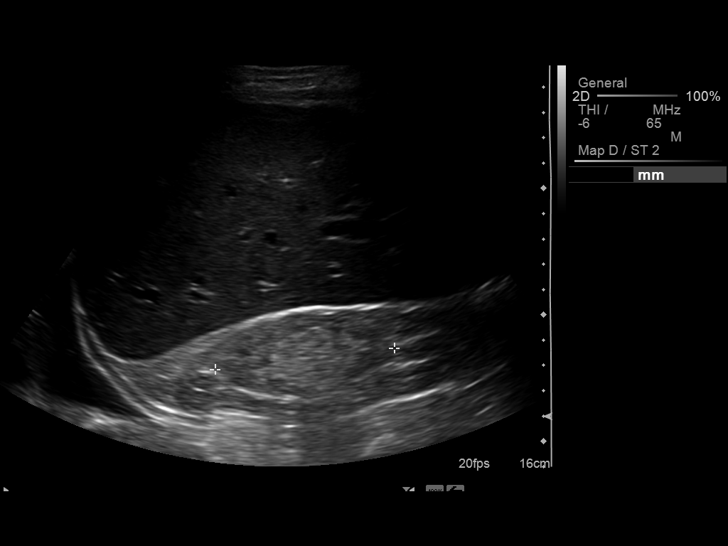
[im 36/72]
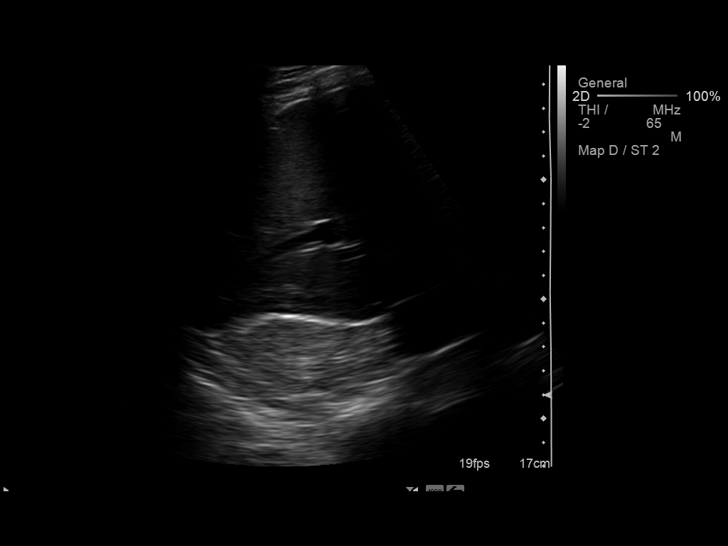
[im 42/72]
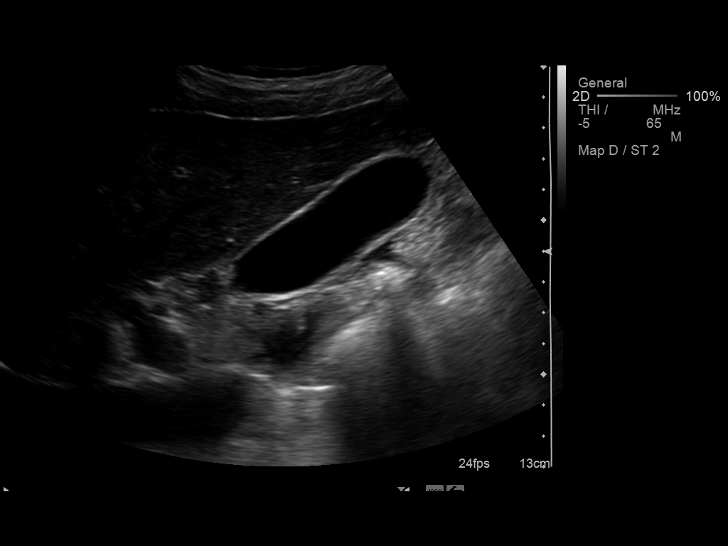
[im 48/72]
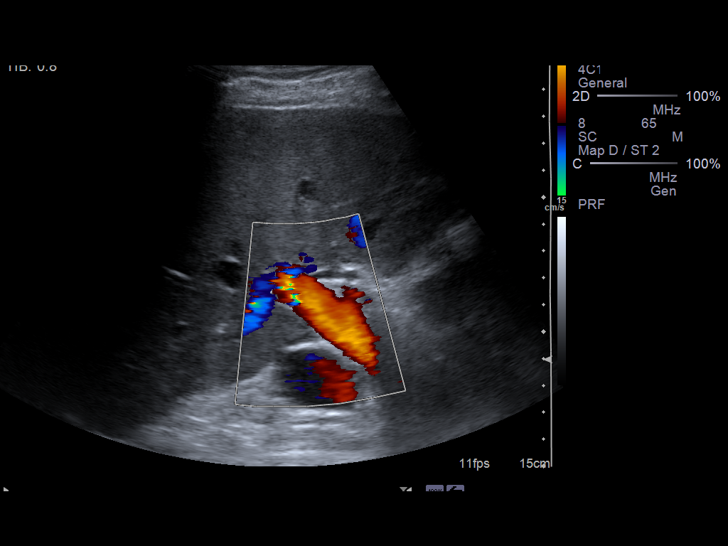
[im 54/72]
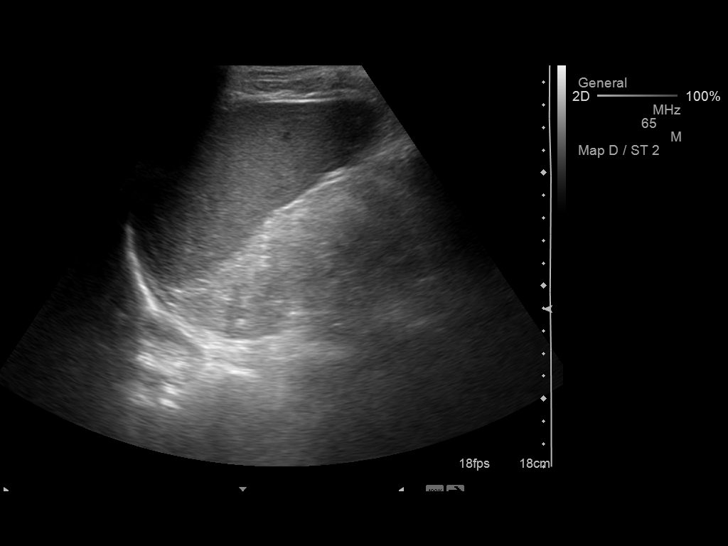
[im 60/72]
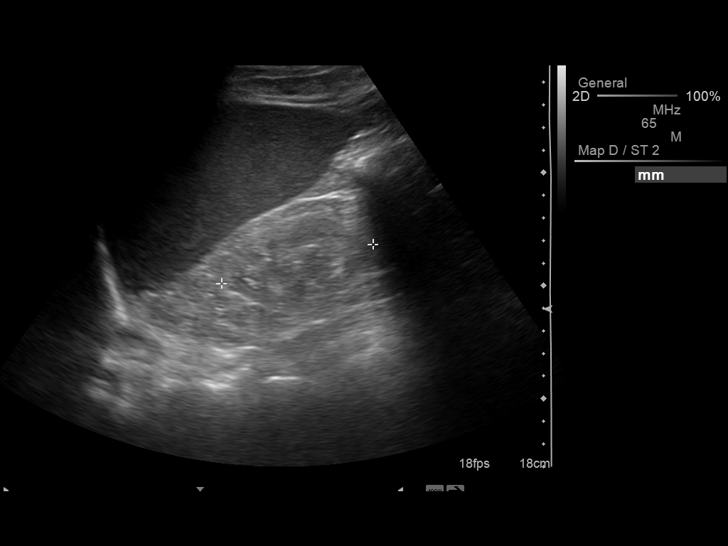
[im 66/72]
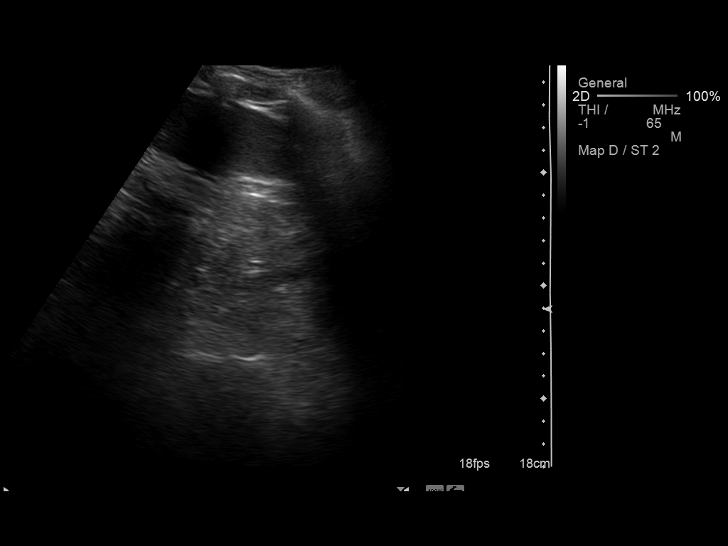
[im 72/72]
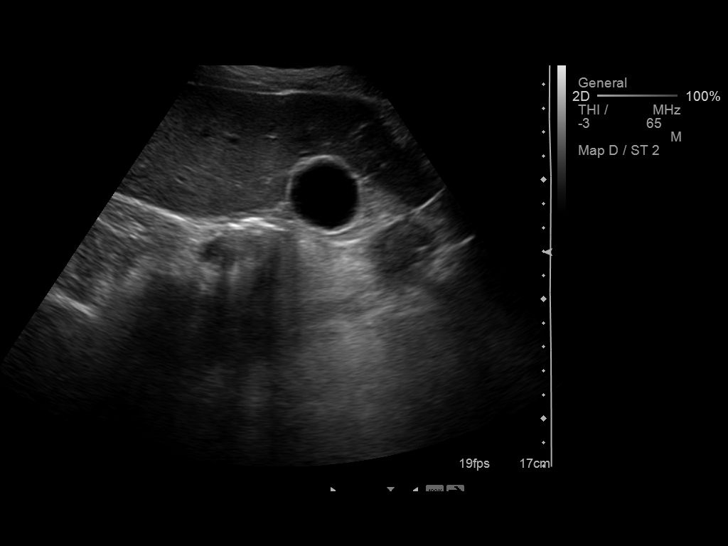

[13 of 25 positions shown; findings below may reference images not displayed]

FINDINGS: Gallbladder:  No gallstones, gallbladder wall thickening, or
pericholecystic fluid. Negative sonographic Murphy's sign.

Common Bile Duct:  Within normal limits in caliber. Measures 4 mm.

Liver: No focal mass lesion identified.  Within normal limits in
parenchymal echogenicity. Liver contour appears smooth by
ultrasound.  Portal vein is patent and demonstrates patent pedal
flow.

IVC:  Appears normal.

Pancreas:  Although the pancreas is difficult to visualize in its
entirety, no focal pancreatic abnormality is identified.

Spleen:  Within normal limits in size and echotexture. Measures
cm in length.

Right kidney:  Small (7 cm) and markedly echogenic, consistent with
end-stage renal disease.  Negative for hydronephrosis.  No mass
visible by ultrasound.

Left kidney:  A small (7 cm) a markedly echogenic, consistent with
end-stage renal disease.  Negative for hydronephrosis.  No mass
visualized.

Abdominal Aorta:  No aneurysm identified.Maximal AP diameter is
cm.

The right pleural effusion is visualized.
IMPRESSION: 1.  No acute findings identified in the abdomen.
2.  Right pleural effusion.
3.  Small and echogenic kidneys, consistent with end-stage renal
disease.

## 2012-07-09 ENCOUNTER — Encounter: Payer: Self-pay | Admitting: Vascular Surgery

## 2012-07-10 ENCOUNTER — Other Ambulatory Visit: Payer: Self-pay

## 2012-07-10 ENCOUNTER — Ambulatory Visit (INDEPENDENT_AMBULATORY_CARE_PROVIDER_SITE_OTHER): Payer: Medicare Other | Admitting: Vascular Surgery

## 2012-07-10 ENCOUNTER — Encounter: Payer: Self-pay | Admitting: Vascular Surgery

## 2012-07-10 VITALS — BP 136/80 | HR 80 | Ht 72.0 in | Wt 153.2 lb

## 2012-07-10 DIAGNOSIS — N186 End stage renal disease: Secondary | ICD-10-CM

## 2012-07-10 NOTE — Progress Notes (Signed)
[facility] [place]  Postoperative Access Visit  History of Present Illness  [name] is a 56 y.o. year old male who presents for postoperative follow-up for: R 1st BVT (Date: 06/09/12).  The patient's wounds are healed.  The patient notes no steal symptoms.  The patient is able to complete their activities of daily living.  The patient's current symptoms are: none.  Physical Examination  Filed Vitals:   07/10/12 0838  BP: 136/80  Pulse: 80   RUE: Incision is healed, skin feels warm, hand grip is 5/5, sensation in digits is intact, palpable thrill, bruit can be auscultated, On Sonosite > 6 mm throughout  Medical Decision Making  CLARKE PERETZ is a 56 y.o. year old male who presents s/p R 1st BVT.  Patient is ready for 2nd stage transposition which will be scheduled for 10 DEC 13  Thank you for allowing [place] to participate in this patient's care.  Leonides Sake, MD [facility] [place] Office: [phone] Pager: [phone]

## 2012-07-13 ENCOUNTER — Encounter (HOSPITAL_COMMUNITY): Payer: Self-pay | Admitting: *Deleted

## 2012-07-13 MED ORDER — VANCOMYCIN HCL IN DEXTROSE 1-5 GM/200ML-% IV SOLN
1000.0000 mg | INTRAVENOUS | Status: AC
  Administered 2012-07-14: 1000 mg via INTRAVENOUS
  Filled 2012-07-13: qty 200

## 2012-07-14 ENCOUNTER — Encounter (HOSPITAL_COMMUNITY): Payer: Self-pay | Admitting: Anesthesiology

## 2012-07-14 ENCOUNTER — Ambulatory Visit (HOSPITAL_COMMUNITY)
Admission: RE | Admit: 2012-07-14 | Discharge: 2012-07-14 | Disposition: A | Discharge status: Home / Self Care | Payer: Medicare Other | Source: Ambulatory Visit | Attending: Vascular Surgery | Admitting: Vascular Surgery

## 2012-07-14 ENCOUNTER — Ambulatory Visit (HOSPITAL_COMMUNITY): Payer: Medicare Other

## 2012-07-14 ENCOUNTER — Ambulatory Visit (HOSPITAL_COMMUNITY): Payer: Medicare Other | Admitting: Anesthesiology

## 2012-07-14 ENCOUNTER — Telehealth: Payer: Self-pay | Admitting: Vascular Surgery

## 2012-07-14 ENCOUNTER — Encounter (HOSPITAL_COMMUNITY): Payer: Self-pay | Admitting: *Deleted

## 2012-07-14 ENCOUNTER — Encounter (HOSPITAL_COMMUNITY)
Admission: RE | Disposition: A | Discharge status: Home / Self Care | Payer: Self-pay | Source: Ambulatory Visit | Attending: Vascular Surgery

## 2012-07-14 DIAGNOSIS — J449 Chronic obstructive pulmonary disease, unspecified: Secondary | ICD-10-CM | POA: Insufficient documentation

## 2012-07-14 DIAGNOSIS — Z823 Family history of stroke: Secondary | ICD-10-CM | POA: Insufficient documentation

## 2012-07-14 DIAGNOSIS — N186 End stage renal disease: Secondary | ICD-10-CM

## 2012-07-14 DIAGNOSIS — Z8249 Family history of ischemic heart disease and other diseases of the circulatory system: Secondary | ICD-10-CM | POA: Insufficient documentation

## 2012-07-14 DIAGNOSIS — J4489 Other specified chronic obstructive pulmonary disease: Secondary | ICD-10-CM | POA: Insufficient documentation

## 2012-07-14 DIAGNOSIS — Z8711 Personal history of peptic ulcer disease: Secondary | ICD-10-CM | POA: Insufficient documentation

## 2012-07-14 DIAGNOSIS — Z87891 Personal history of nicotine dependence: Secondary | ICD-10-CM | POA: Insufficient documentation

## 2012-07-14 DIAGNOSIS — B182 Chronic viral hepatitis C: Secondary | ICD-10-CM | POA: Insufficient documentation

## 2012-07-14 DIAGNOSIS — Z8701 Personal history of pneumonia (recurrent): Secondary | ICD-10-CM | POA: Insufficient documentation

## 2012-07-14 DIAGNOSIS — K861 Other chronic pancreatitis: Secondary | ICD-10-CM | POA: Insufficient documentation

## 2012-07-14 DIAGNOSIS — Z992 Dependence on renal dialysis: Secondary | ICD-10-CM | POA: Insufficient documentation

## 2012-07-14 DIAGNOSIS — I120 Hypertensive chronic kidney disease with stage 5 chronic kidney disease or end stage renal disease: Secondary | ICD-10-CM | POA: Insufficient documentation

## 2012-07-14 HISTORY — PX: BASCILIC VEIN TRANSPOSITION: SHX5742

## 2012-07-14 HISTORY — DX: Personal history of other medical treatment: Z92.89

## 2012-07-14 LAB — SURGICAL PCR SCREEN
MRSA, PCR: POSITIVE — AB
Staphylococcus aureus: POSITIVE — AB

## 2012-07-14 LAB — POCT I-STAT, CHEM 8
BUN: 26 mg/dL — ABNORMAL HIGH (ref 6–23)
Calcium, Ion: 1.11 mmol/L — ABNORMAL LOW (ref 1.12–1.23)
Hemoglobin: 15 g/dL (ref 13.0–17.0)
TCO2: 24 mmol/L (ref 0–100)

## 2012-07-14 SURGERY — TRANSPOSITION, VEIN, BASILIC
Anesthesia: General | Site: Arm Upper | Laterality: Right | Wound class: Clean

## 2012-07-14 MED ORDER — SODIUM CHLORIDE 0.9 % IV SOLN
INTRAVENOUS | Status: DC | PRN
  Administered 2012-07-14: 09:00:00 via INTRAVENOUS

## 2012-07-14 MED ORDER — LIDOCAINE-EPINEPHRINE (PF) 1 %-1:200000 IJ SOLN
INTRAMUSCULAR | Status: AC
  Filled 2012-07-14: qty 10

## 2012-07-14 MED ORDER — HYDROMORPHONE HCL PF 1 MG/ML IJ SOLN
INTRAMUSCULAR | Status: AC
  Filled 2012-07-14: qty 1

## 2012-07-14 MED ORDER — BUPIVACAINE-EPINEPHRINE (PF) 0.5% -1:200000 IJ SOLN
INTRAMUSCULAR | Status: AC
  Filled 2012-07-14: qty 10

## 2012-07-14 MED ORDER — OXYCODONE HCL 5 MG PO TABS: 5.0000 mg | ORAL_TABLET | ORAL | Status: AC | PRN

## 2012-07-14 MED ORDER — PROMETHAZINE HCL 25 MG/ML IJ SOLN: 6.2500 mg | INTRAMUSCULAR | Status: DC | PRN

## 2012-07-14 MED ORDER — OXYCODONE HCL 5 MG/5ML PO SOLN: 5.0000 mg | Freq: Once | ORAL | Status: DC | PRN

## 2012-07-14 MED ORDER — FENTANYL CITRATE 0.05 MG/ML IJ SOLN
INTRAMUSCULAR | Status: DC | PRN
  Administered 2012-07-14 (×2): 50 ug via INTRAVENOUS

## 2012-07-14 MED ORDER — MUPIROCIN 2 % EX OINT: TOPICAL_OINTMENT | Freq: Two times a day (BID) | CUTANEOUS | Status: DC

## 2012-07-14 MED ORDER — SODIUM CHLORIDE 0.9 % IR SOLN
Status: DC | PRN
  Administered 2012-07-14: 08:00:00

## 2012-07-14 MED ORDER — THROMBIN 20000 UNITS EX SOLR
CUTANEOUS | Status: DC | PRN
  Administered 2012-07-14: 11:00:00 via TOPICAL

## 2012-07-14 MED ORDER — THROMBIN 20000 UNITS EX SOLR
CUTANEOUS | Status: AC
  Filled 2012-07-14: qty 20000

## 2012-07-14 MED ORDER — MIDAZOLAM HCL 5 MG/5ML IJ SOLN
INTRAMUSCULAR | Status: DC | PRN
  Administered 2012-07-14: 2 mg via INTRAVENOUS

## 2012-07-14 MED ORDER — MEPERIDINE HCL 25 MG/ML IJ SOLN: 6.2500 mg | INTRAMUSCULAR | Status: DC | PRN

## 2012-07-14 MED ORDER — MUPIROCIN 2 % EX OINT
TOPICAL_OINTMENT | CUTANEOUS | Status: AC
  Administered 2012-07-14: 1
  Filled 2012-07-14: qty 22

## 2012-07-14 MED ORDER — OXYCODONE HCL 5 MG PO TABS: 5.0000 mg | ORAL_TABLET | Freq: Once | ORAL | Status: DC | PRN

## 2012-07-14 MED ORDER — 0.9 % SODIUM CHLORIDE (POUR BTL) OPTIME
TOPICAL | Status: DC | PRN
  Administered 2012-07-14: 1000 mL

## 2012-07-14 MED ORDER — HYDROMORPHONE HCL PF 1 MG/ML IJ SOLN
0.2500 mg | INTRAMUSCULAR | Status: DC | PRN
  Administered 2012-07-14 (×3): 0.5 mg via INTRAVENOUS

## 2012-07-14 MED ORDER — PROPOFOL 10 MG/ML IV BOLUS
INTRAVENOUS | Status: DC | PRN
  Administered 2012-07-14: 50 mg via INTRAVENOUS
  Administered 2012-07-14: 200 mg via INTRAVENOUS

## 2012-07-14 MED ORDER — SODIUM CHLORIDE 0.9 % IV SOLN: INTRAVENOUS | Status: DC

## 2012-07-14 SURGICAL SUPPLY — 40 items
CANISTER SUCTION 2500CC (MISCELLANEOUS) ×2 IMPLANT
CLIP TI MEDIUM 24 (CLIP) ×2 IMPLANT
CLIP TI WIDE RED SMALL 24 (CLIP) ×2 IMPLANT
CLOTH BEACON ORANGE TIMEOUT ST (SAFETY) ×2 IMPLANT
CORDS BIPOLAR (ELECTRODE) IMPLANT
COVER PROBE W GEL 5X96 (DRAPES) ×2 IMPLANT
COVER SURGICAL LIGHT HANDLE (MISCELLANEOUS) ×2 IMPLANT
DECANTER SPIKE VIAL GLASS SM (MISCELLANEOUS) IMPLANT
DERMABOND ADVANCED (GAUZE/BANDAGES/DRESSINGS) ×2
DERMABOND ADVANCED .7 DNX12 (GAUZE/BANDAGES/DRESSINGS) ×2 IMPLANT
DRAIN PENROSE 1/2X12 LTX STRL (WOUND CARE) IMPLANT
ELECT REM PT RETURN 9FT ADLT (ELECTROSURGICAL) ×2
ELECTRODE REM PT RTRN 9FT ADLT (ELECTROSURGICAL) ×1 IMPLANT
GLOVE BIO SURGEON STRL SZ 6.5 (GLOVE) ×2 IMPLANT
GLOVE BIO SURGEON STRL SZ7 (GLOVE) ×2 IMPLANT
GLOVE BIO SURGEON STRL SZ7.5 (GLOVE) ×2 IMPLANT
GLOVE BIOGEL PI IND STRL 6.5 (GLOVE) ×3 IMPLANT
GLOVE BIOGEL PI IND STRL 7.0 (GLOVE) ×1 IMPLANT
GLOVE BIOGEL PI IND STRL 7.5 (GLOVE) ×2 IMPLANT
GLOVE BIOGEL PI INDICATOR 6.5 (GLOVE) ×3
GLOVE BIOGEL PI INDICATOR 7.0 (GLOVE) ×1
GLOVE BIOGEL PI INDICATOR 7.5 (GLOVE) ×2
GLOVE ECLIPSE 6.5 STRL STRAW (GLOVE) ×2 IMPLANT
GOWN STRL NON-REIN LRG LVL3 (GOWN DISPOSABLE) ×8 IMPLANT
KIT BASIN OR (CUSTOM PROCEDURE TRAY) ×2 IMPLANT
KIT ROOM TURNOVER OR (KITS) ×2 IMPLANT
NS IRRIG 1000ML POUR BTL (IV SOLUTION) ×2 IMPLANT
PACK CV ACCESS (CUSTOM PROCEDURE TRAY) ×2 IMPLANT
PAD ARMBOARD 7.5X6 YLW CONV (MISCELLANEOUS) ×4 IMPLANT
SPONGE SURGIFOAM ABS GEL 100 (HEMOSTASIS) IMPLANT
SUT MNCRL AB 4-0 PS2 18 (SUTURE) ×2 IMPLANT
SUT PROLENE 6 0 BV (SUTURE) ×2 IMPLANT
SUT PROLENE 7 0 BV 1 (SUTURE) ×2 IMPLANT
SUT SILK 2 0 SH (SUTURE) IMPLANT
SUT VIC AB 3-0 SH 27 (SUTURE) ×3
SUT VIC AB 3-0 SH 27X BRD (SUTURE) ×3 IMPLANT
TOWEL OR 17X24 6PK STRL BLUE (TOWEL DISPOSABLE) ×2 IMPLANT
TOWEL OR 17X26 10 PK STRL BLUE (TOWEL DISPOSABLE) ×2 IMPLANT
UNDERPAD 30X30 INCONTINENT (UNDERPADS AND DIAPERS) ×2 IMPLANT
WATER STERILE IRR 1000ML POUR (IV SOLUTION) ×2 IMPLANT

## 2012-07-14 NOTE — Op Note (Signed)
OPERATIVE NOTE   PROCEDURE: right second stage basilic vein transposition (brachiobasilic arteriovenous fistula) placement  PRE-OPERATIVE DIAGNOSIS: end stage renal disease   POST-OPERATIVE DIAGNOSIS: same as above   SURGEON: Leonides Sake, MD  ASSISTANT(S): Della Goo, [facility]   ANESTHESIA: general  ESTIMATED BLOOD LOSS: 30 cc  FINDING(S): 1.  Palpable thrill throughout at end of case 2.  Tapered distal fistula (4-5 mm near anastomosis)  SPECIMEN(S):  none  INDICATIONS:   [name] is a 56 y.o. male who presents with successful right first stage basilic vein transposition.  The patient is scheduled for right second stage basilic vein transposition.  The patient is aware the risks include but are not limited to: bleeding, infection, steal syndrome, nerve damage, ischemic monomelic neuropathy, failure to mature, and need for additional procedures.  The patient is aware of the risks of the procedure and elects to proceed forward.  DESCRIPTION: After full informed written consent was obtained from the patient, the patient was brought back to the operating room and placed supine upon the operating table.  Prior to induction, the patient received IV antibiotics.   After obtaining adequate anesthesia, the patient was then prepped and draped in the standard fashion for a right arm access procedure.  I turned my attention first to identifying the patient's brachiobasilic arteriovenous fistula.  Using SonoSite guidance, the location of this fistula was marked out on the skin.   I made an longitudinal incision over the fistula from its arterial anastomosis up to its axillary extent.  I carefully dissected the fistula away from its adjacent nerves.  Eventually the entirety of this fistula was mobilized and I dissected a plane on top of the bicipital fascia with electrocautery.  Note, the distal portion of this fistula was tapered to 4-5 mm near its anastomosis.  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  To date, he has NOT had steal symptoms on this side, so I felt it was advantageous in this patient to preserve the current anastomosis rather than revising it.  Hopefully the taper will prevent him from developing steal and allow the fistula to continue to function well.   The fistula was secured in its new location with loosely placed interrupt 3-0 Vicryl stitches, taking care to avoid compressing the fistula.  The deep subcutaneous tissue was inspected for bleeding.  Bleeding was controlled with electrocautery and placement of large pieces of thrombin and gelfoam.  I washed out the surgical site after waiting a few minutes, and there was no further bleeding.  The deep subcutaneous tissue was reapproximated with mattress sutures of 3-0 Vicryl to eliminate some of the dead space.  The superficial subcutaneous tissue was then reapproximated along the incision line with a running stitch of 3-0 Vicryl.  The skin was then reapproximated with a running subcuticular of 4-0 Monocryl.  The skin was then cleaned, dried, and reinforced with Dermabond.  The patient tolerated this procedure well.   COMPLICATIONS: none  CONDITION: stable  Leonides Sake, MD [facility] [place] Office: [phone] Pager: [phone]  07/14/2012, 11:14 AM

## 2012-07-14 NOTE — Anesthesia Preprocedure Evaluation (Signed)
Anesthesia Evaluation  Patient identified by MRN, date of birth, ID band Patient awake    Reviewed: Allergy & Precautions, H&P , NPO status , Patient's Chart, lab work & pertinent test results, reviewed documented beta blocker date and time   Airway Mallampati: II TM Distance: >3 FB Neck ROM: full    Dental  (+) Dental Advisory Given   Pulmonary asthma , resolved, COPDCurrent Smoker,  breath sounds clear to auscultation        Cardiovascular hypertension, On Medications + angina negative cardio ROS  + dysrhythmias Rhythm:regular     Neuro/Psych negative neurological ROS  negative psych ROS   GI/Hepatic PUD, (+)     substance abuse   , Hepatitis -, C  Endo/Other  negative endocrine ROS  Renal/GU ESRF     Musculoskeletal   Abdominal   Peds  Hematology negative hematology ROS (+)   Anesthesia Other Findings See surgeon's H&P   Reproductive/Obstetrics negative OB ROS                           Anesthesia Physical Anesthesia Plan  ASA: III  Anesthesia Plan: General LMA   Post-op Pain Management:    Induction:   Airway Management Planned:   Additional Equipment:   Intra-op Plan:   Post-operative Plan:   Informed Consent: I have reviewed the patients History and Physical, chart, labs and discussed the procedure including the risks, benefits and alternatives for the proposed anesthesia with the patient or authorized representative who has indicated his/her understanding and acceptance.     Plan Discussed with: Anesthesiologist, CRNA and Surgeon  Anesthesia Plan Comments:         Anesthesia Quick Evaluation

## 2012-07-14 NOTE — Transfer of Care (Signed)
Immediate Anesthesia Transfer of Care Note  Patient: [name]  Procedure(s) Performed: Procedure(s) (LRB) with comments: BASCILIC VEIN TRANSPOSITION (Right) - Second stage; ultrasound guided  Patient Location: PACU  Anesthesia Type:General  Level of Consciousness: awake, alert , oriented and patient cooperative  Airway & Oxygen Therapy: Patient Spontanous Breathing and Patient connected to nasal cannula oxygen  Post-op Assessment: Report given to PACU RN, Post -op Vital signs reviewed and stable and Patient moving all extremities X 4  Post vital signs: Reviewed and stable  Complications: No apparent anesthesia complications

## 2012-07-14 NOTE — Anesthesia Postprocedure Evaluation (Signed)
  Anesthesia Post-op Note  Patient: [name]  Procedure(s) Performed: Procedure(s) (LRB) with comments: BASCILIC VEIN TRANSPOSITION (Right) - Second stage; ultrasound guided  Patient Location: PACU  Anesthesia Type:General  Level of Consciousness: awake  Airway and Oxygen Therapy: Patient Spontanous Breathing  Post-op Pain: none  Post-op Assessment: Post-op Vital signs reviewed  Post-op Vital Signs: stable  Complications: No apparent anesthesia complications
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
## 2012-07-14 NOTE — Telephone Encounter (Addendum)
Message copied by Shari Prows on Tue Jul 14, 2012 12:42 PM ------      Message from: Melene Plan      Created: Tue Jul 14, 2012 11:49 AM                   ----- Message -----         From: Fransisco Hertz, MD         Sent: 07/14/2012  11:19 AM           To: Reuel Derby, Melene Plan, RN            [name]      [id number]      12/10/1955            Procedure: R 2nd stage basilic vein transposition             Asst: Della Goo, [facility]            Follow-up: 4-6 weeks   I scheduled an appt for the above patient for 08/21/12 at 12:30pm with [facility]. I left a voice message for the pt [id number] on his home phone # and I also mailed a letter to him regarding the appt.awt

## 2012-07-15 ENCOUNTER — Encounter (HOSPITAL_COMMUNITY): Payer: Self-pay | Admitting: Vascular Surgery

## 2012-07-17 IMAGING — CR DG CHEST 2V
2 series · 2 of 2 positions shown · non-contrast
Comparison: 12/19/2011

CLINICAL DATA: Chest pain, shortness of breath, weakness

CHEST - 2 VIEW

[w chest lat]
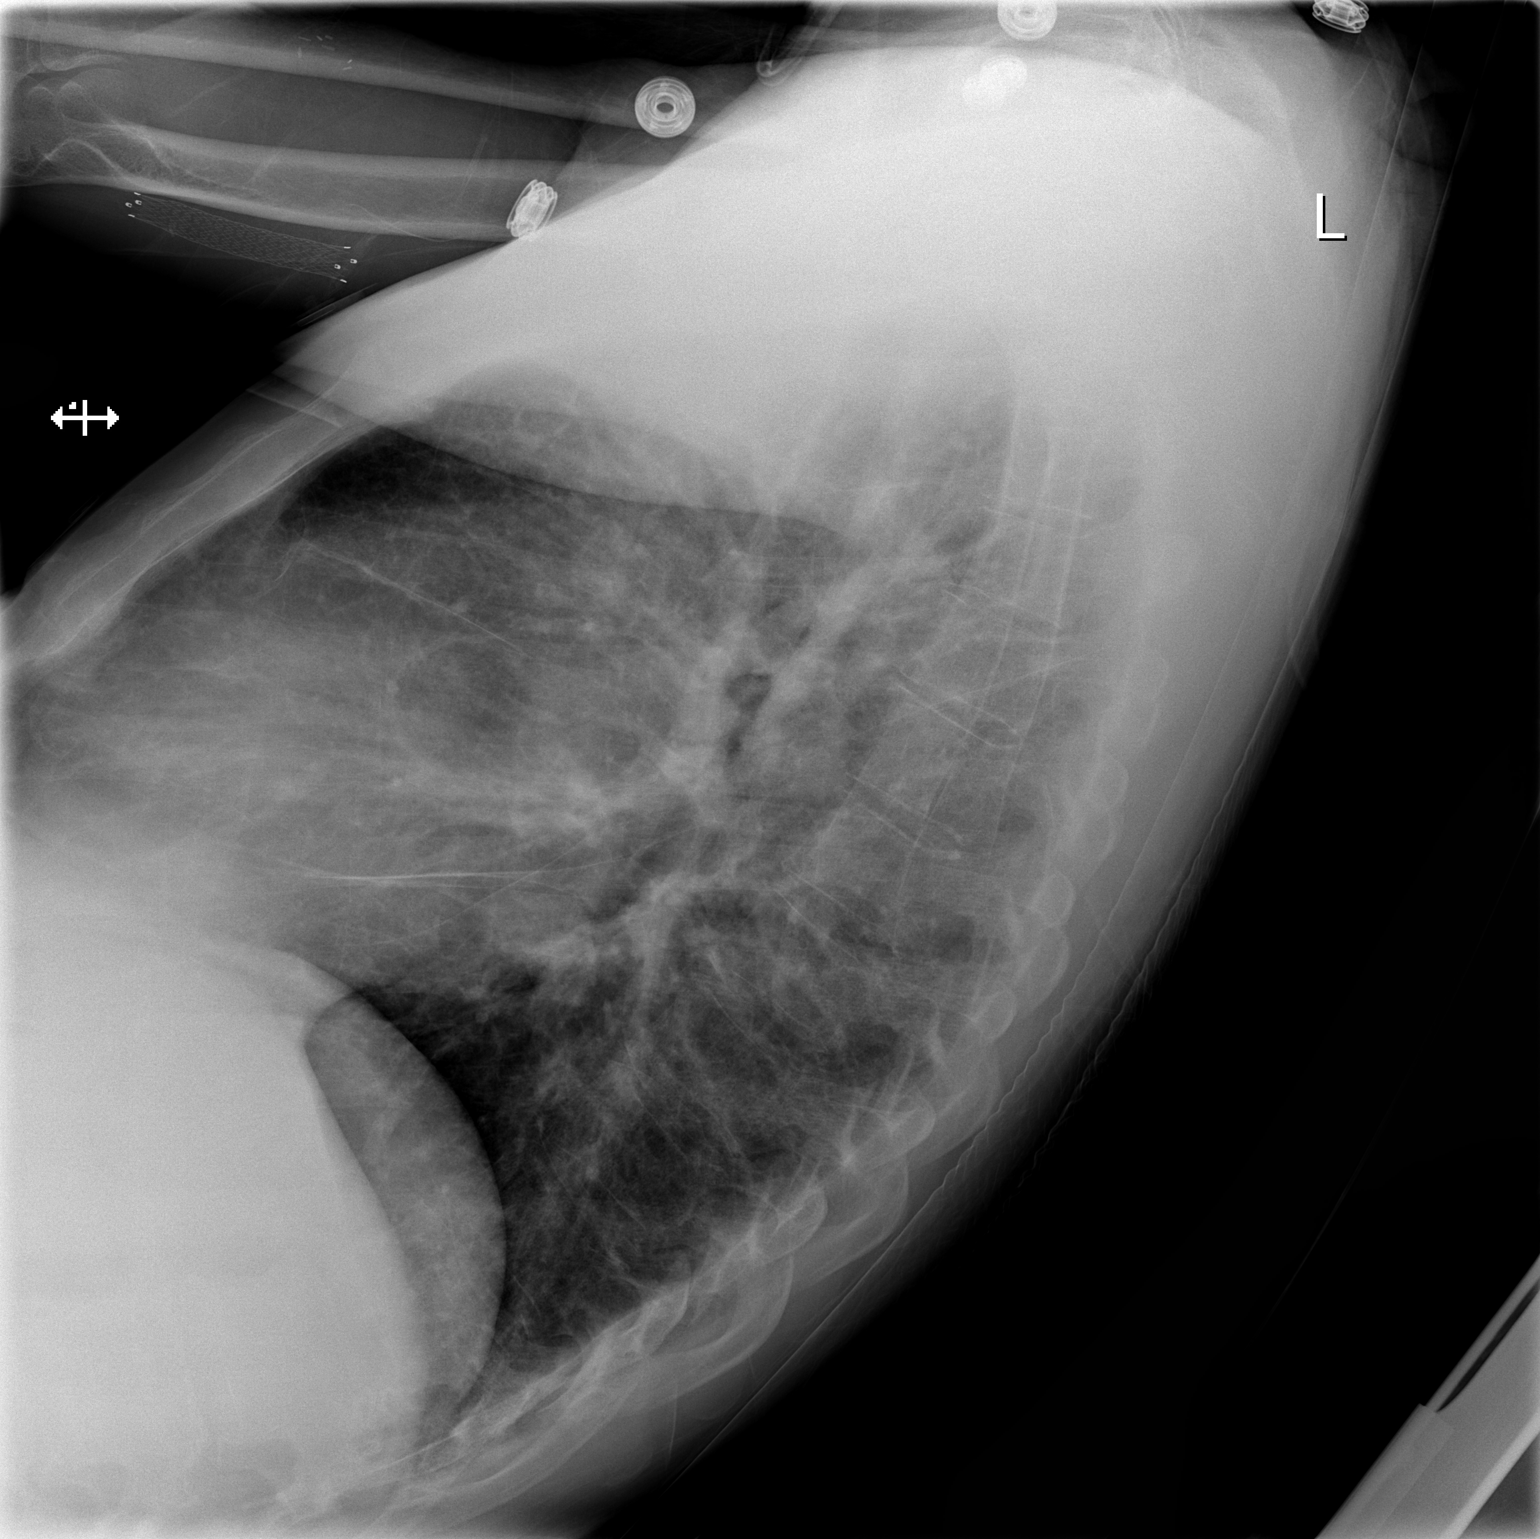

[x chest ap]
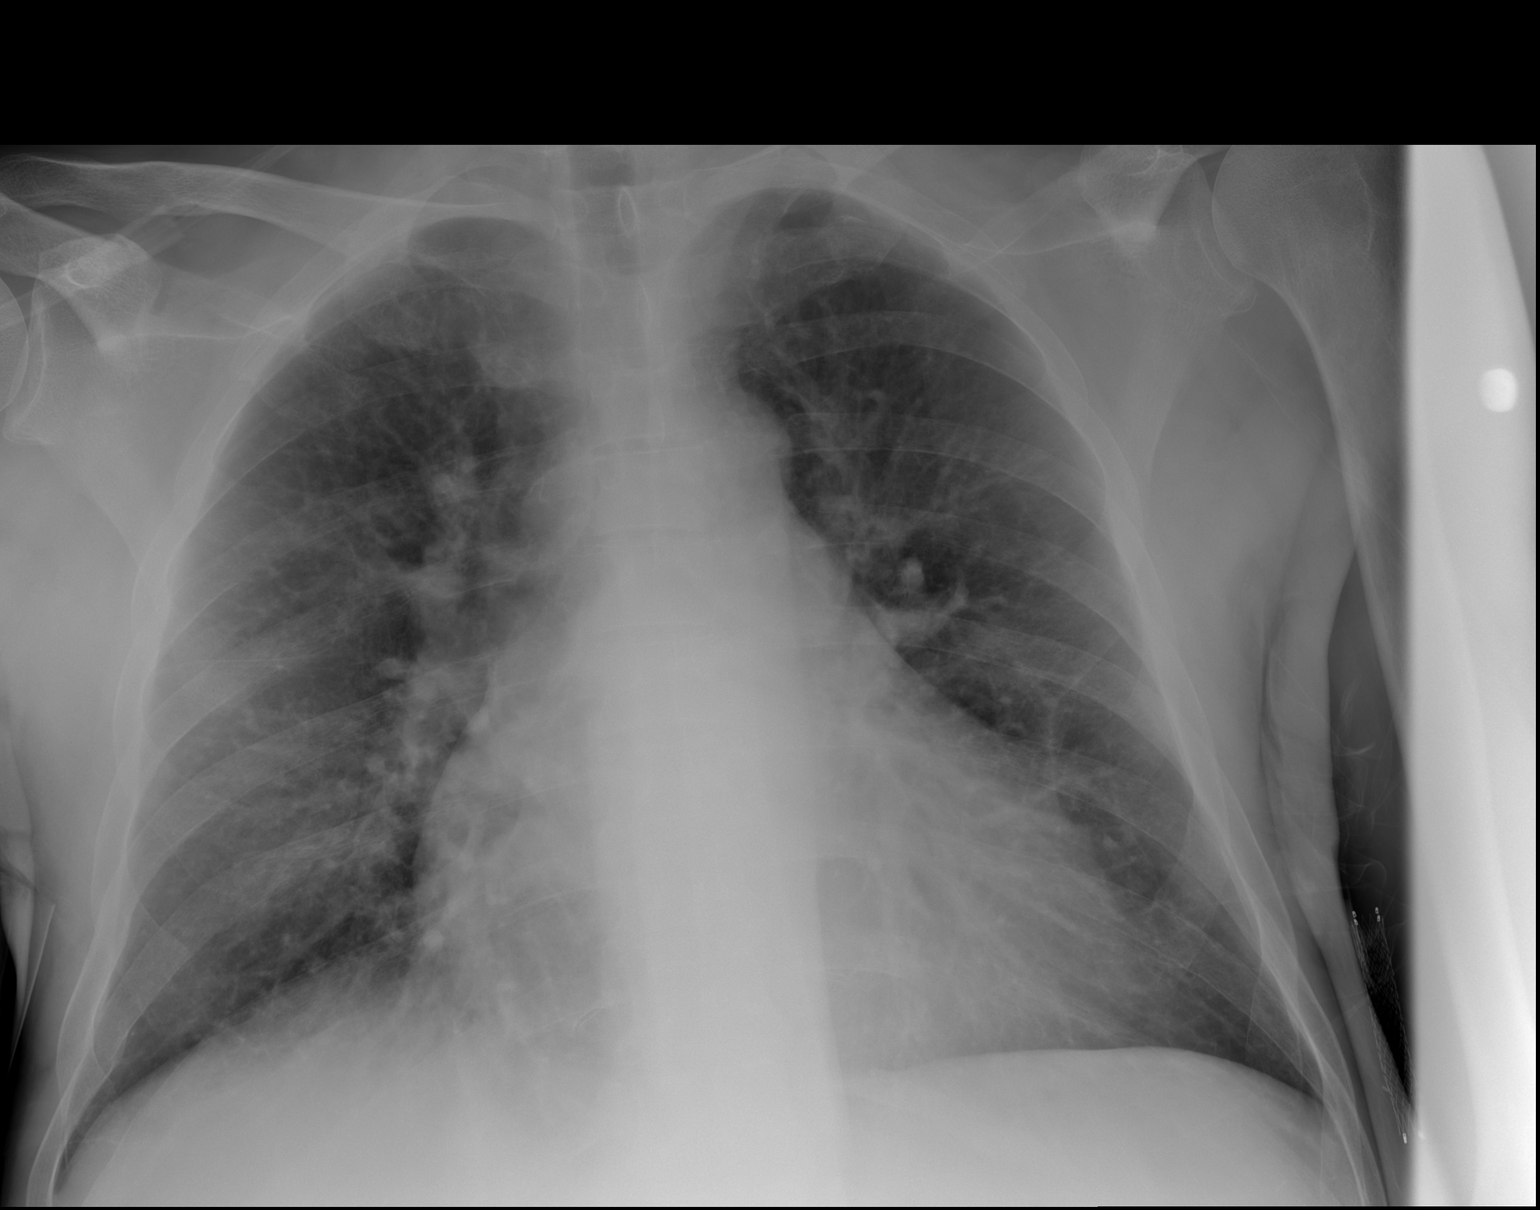

[2 of 2 positions shown; findings below may reference images not displayed]

FINDINGS: Cardiomegaly with pulmonary vascular congestion.  No
frank interstitial edema. No pleural effusion or pneumothorax.

Mild degenerative changes of the visualized thoracolumbar spine.
IMPRESSION: Cardiomegaly with pulmonary vascular congestion.  No frank
interstitial edema.

## 2012-08-02 IMAGING — CR DG CHEST 1V PORT
1 series · 1 of 1 positions shown · non-contrast
Comparison: 5830 hours the same day and earlier.

CLINICAL DATA: 56-year-old male status post central line placement.

PORTABLE CHEST - 1 VIEW

[AP]
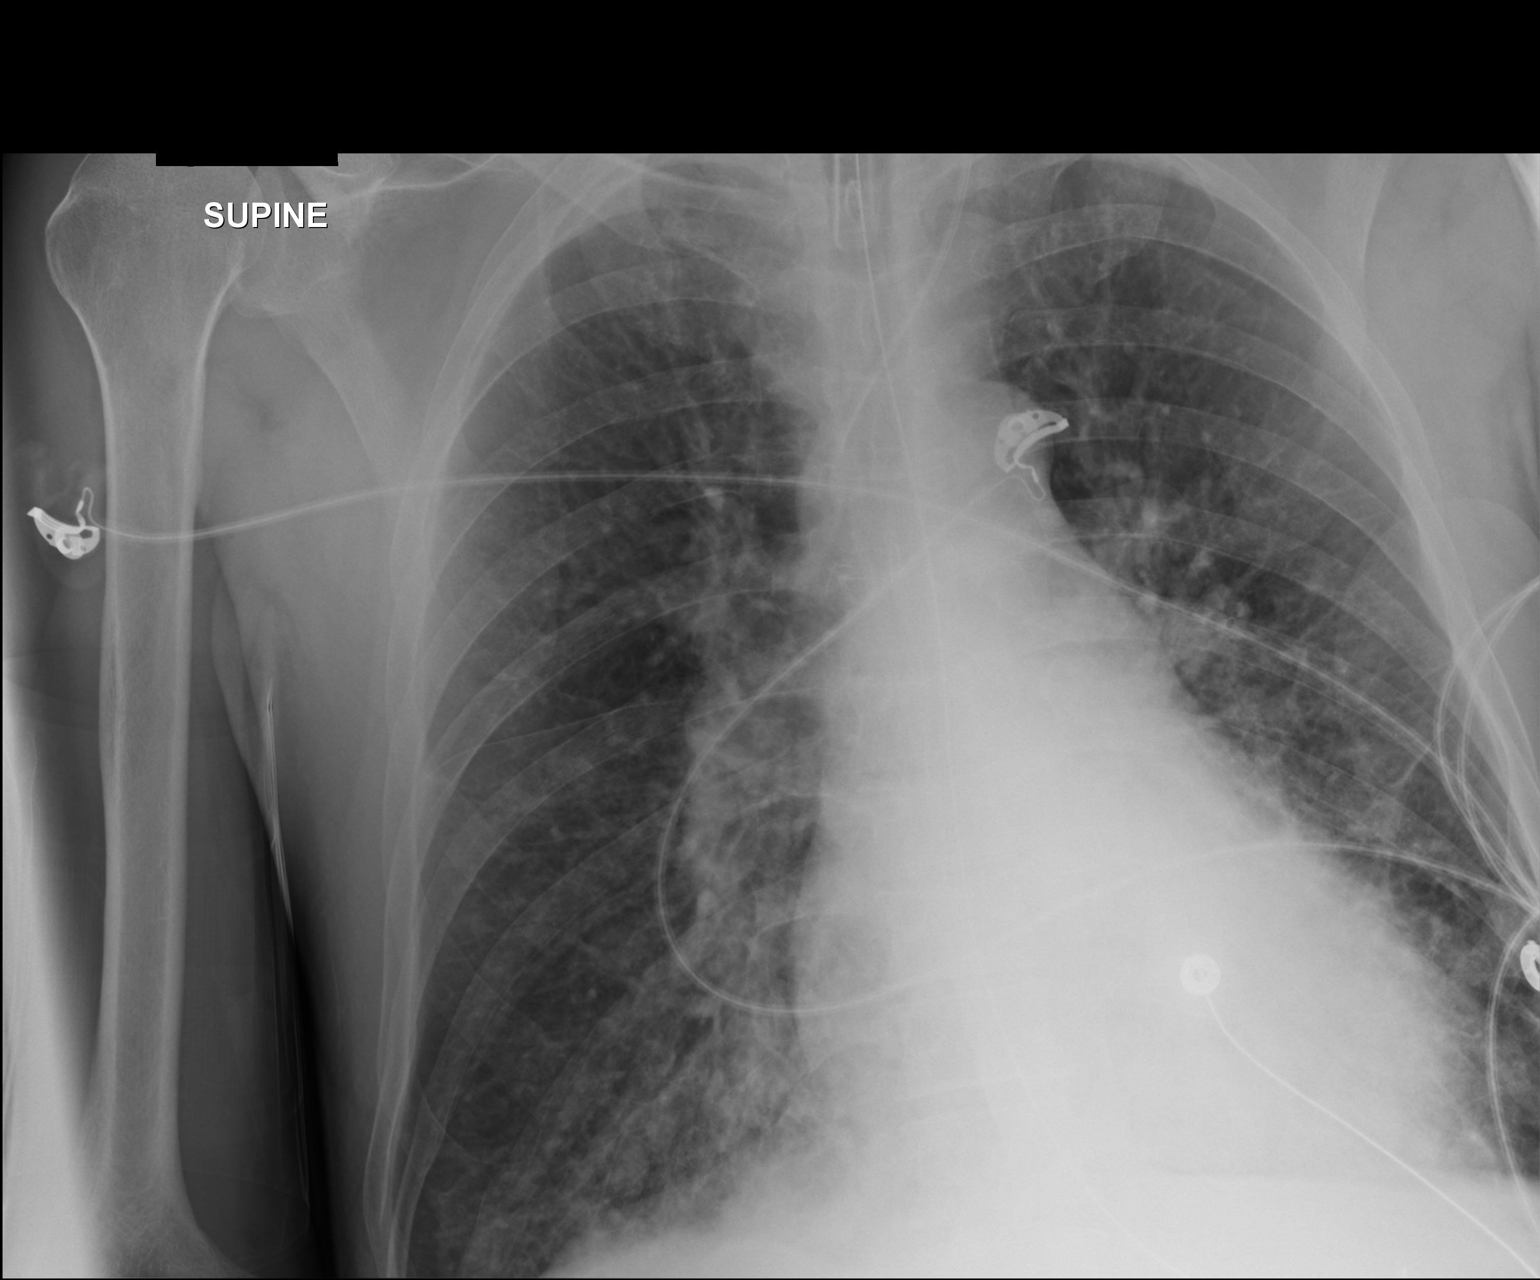

[1 of 1 positions shown; findings below may reference images not displayed]

FINDINGS: Portable AP supine view 3938 hours.  Endotracheal tube
stable at the level of the clavicles.  Enteric tube courses to the
abdomen.  New left IJ approach central venous catheter, tip located
about 2 cm above the carina to the right of midline.  No
pneumothorax.  Stable lung volumes.  Stable cardiac size and
mediastinal contours.  Stable increased interstitial opacity or
vascular congestion.
IMPRESSION: 1.  Left IJ central line, tip at the SVC level.  No pneumothorax.
2. Otherwise, stable lines and tubes.
3.  Otherwise stable chest.

## 2012-08-02 IMAGING — CR DG CHEST 1V PORT
2 series · 2 of 2 positions shown · non-contrast
Comparison: Two-view chest x-ray 01/10/2012, 10/24/2011.

CLINICAL DATA: Cardiorespiratory arrest.  Intubation.

PORTABLE CHEST - 1 VIEW

[AP (1 of 2)]
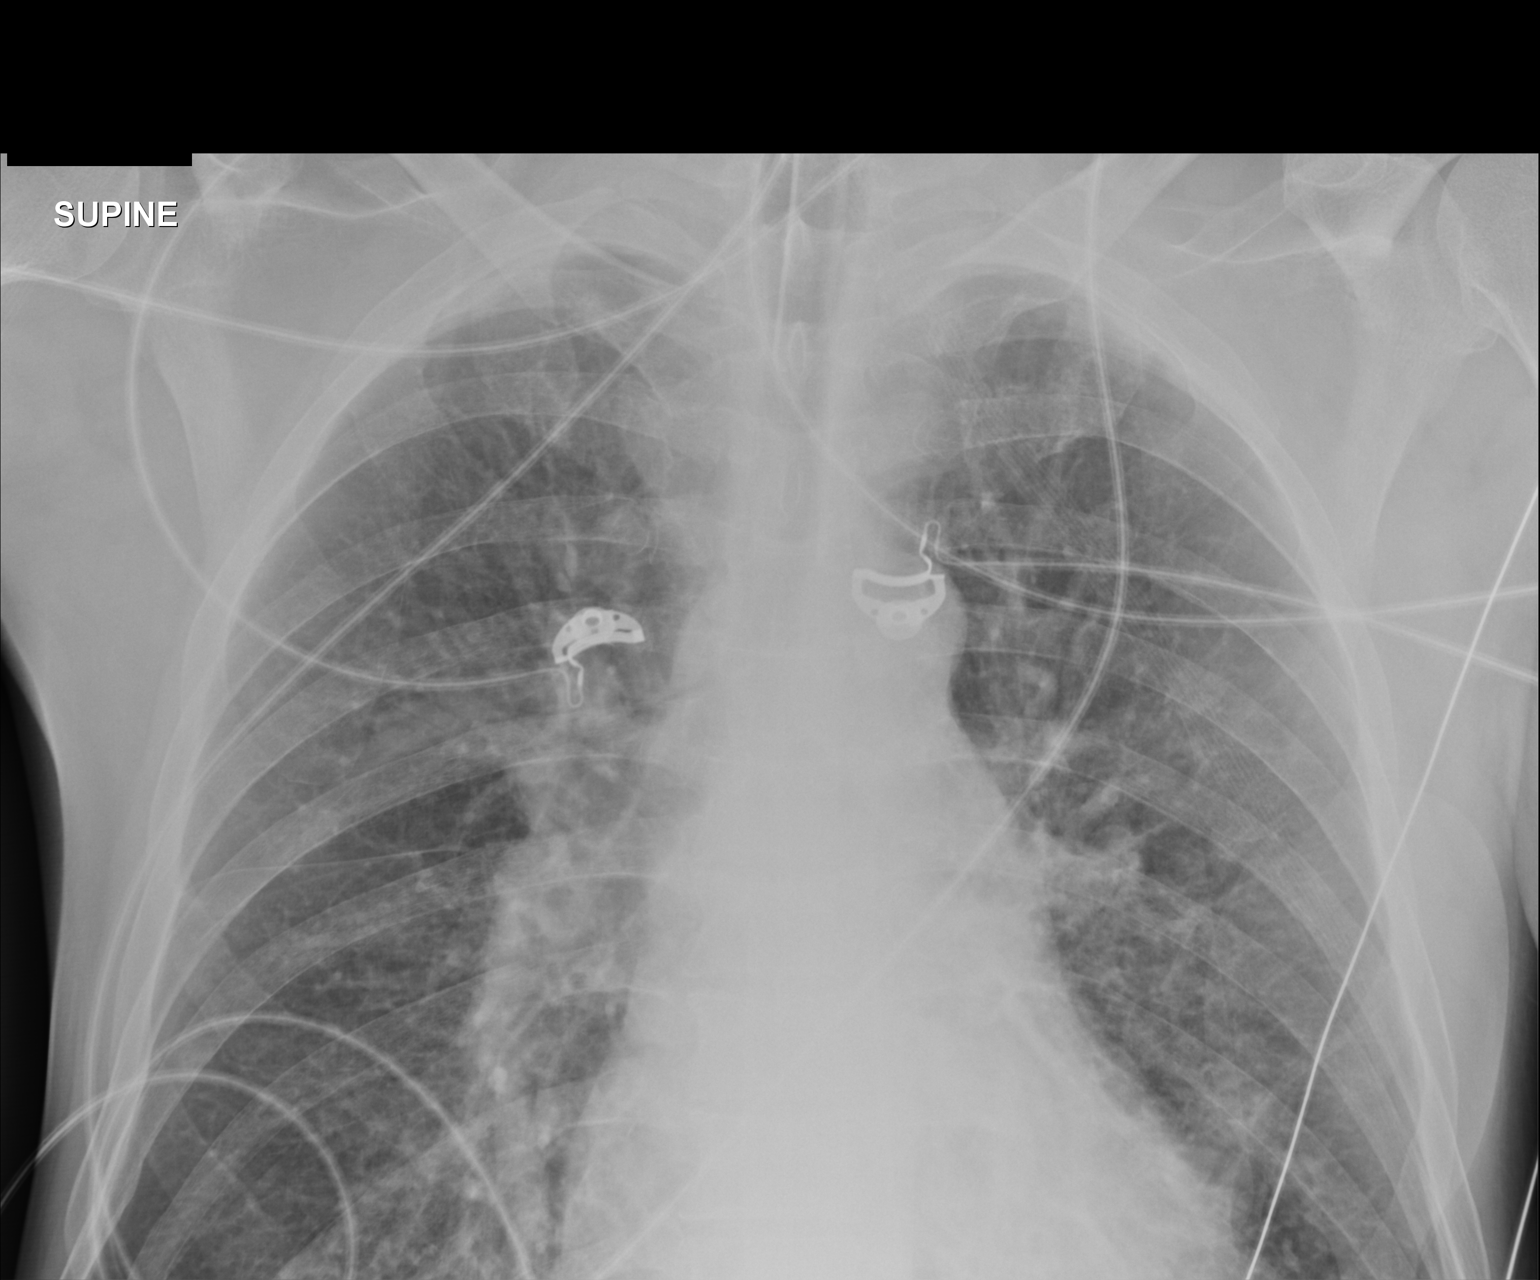

[AP (2 of 2)]
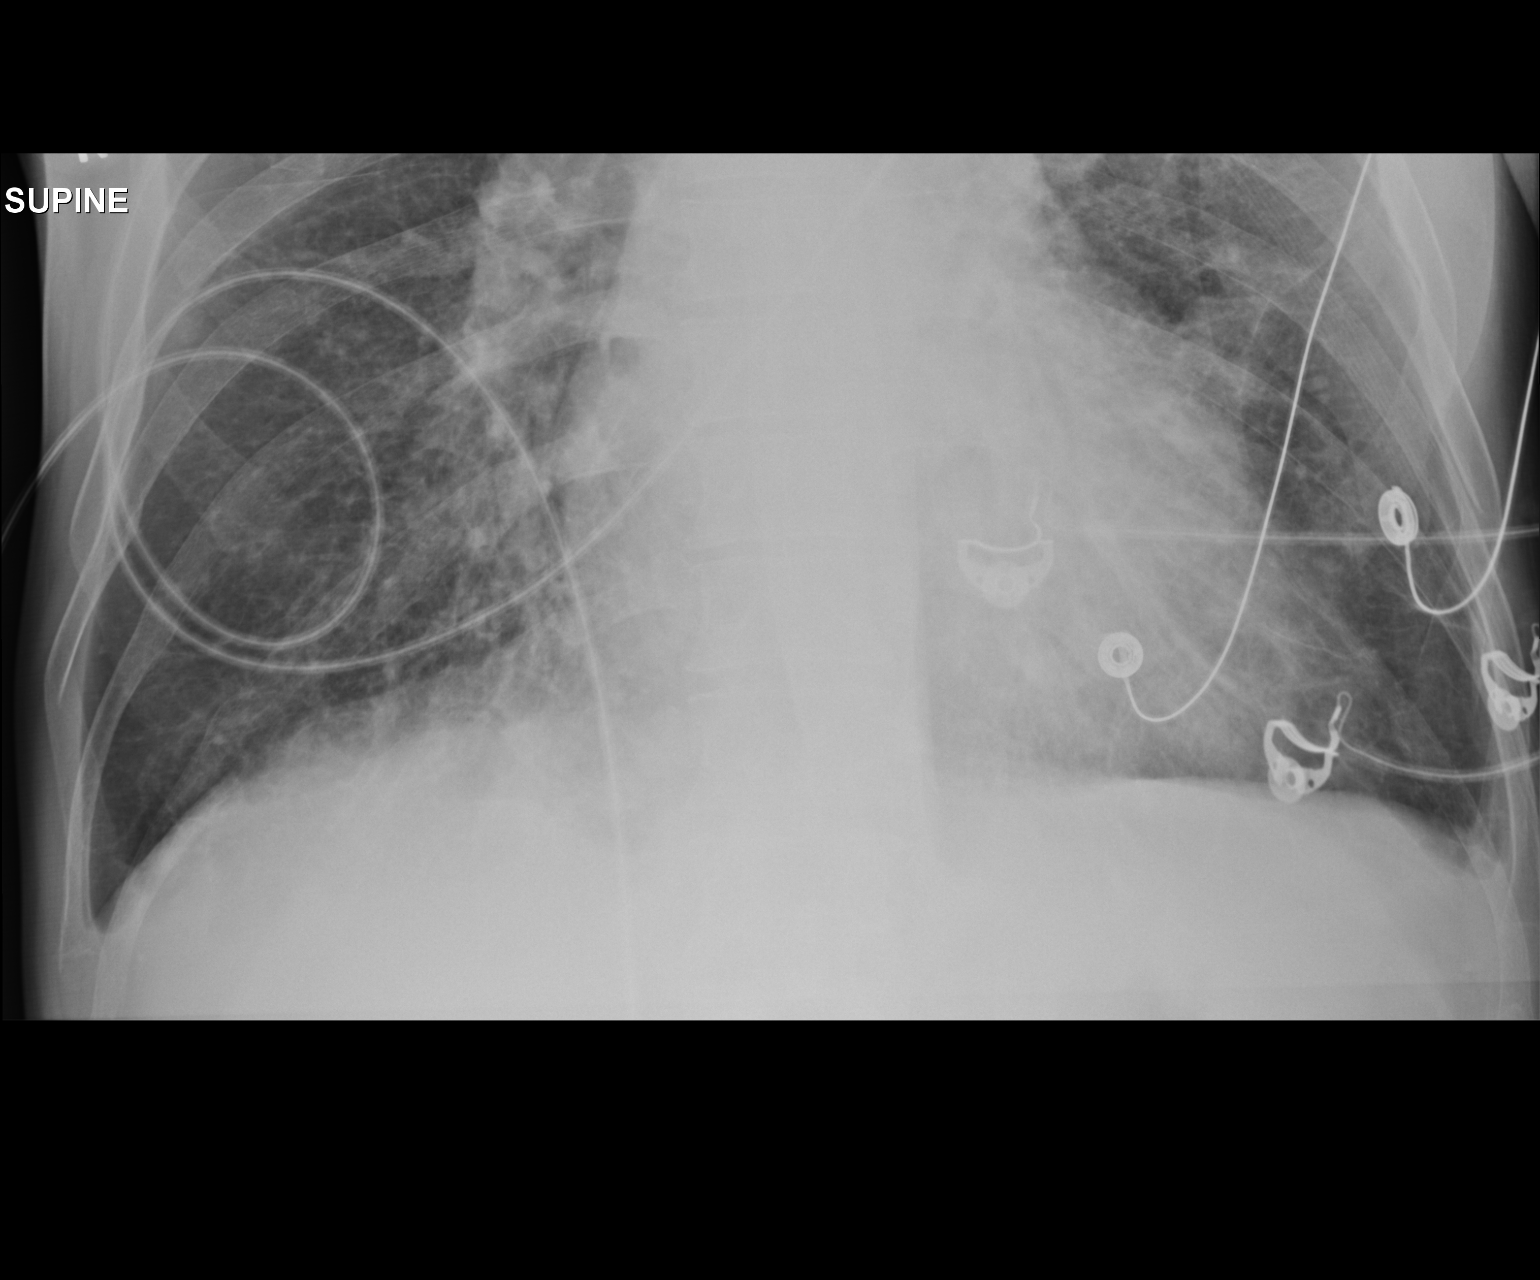

[2 of 2 positions shown; findings below may reference images not displayed]

FINDINGS: Endotracheal tube tip projects in satisfactory position
below the thoracic inlet approximately 9 cm above the carina.
Cardiac silhouette enlarged but stable.  Interval development mild
diffuse interstitial pulmonary edema since the 01/10/2012
examination.  No confluent airspace consolidation.  Probable small
bilateral pleural effusions.  No pneumothorax.
IMPRESSION: 1.  Endotracheal tube tip in satisfactory position projecting
approximately 9 cm above the carina.
2.  Mild CHF, with stable cardiomegaly and mild diffuse
interstitial pulmonary edema.
3.  Very small bilateral pleural effusions.

## 2012-08-05 IMAGING — CR DG CHEST 1V PORT
1 series · 1 of 1 positions shown · non-contrast
Comparison: Chest x-ray 01/26/2012.

CLINICAL DATA: Follow-up for edema.  The patient is on
hemodialysis.

PORTABLE CHEST - 1 VIEW

[view not recorded]
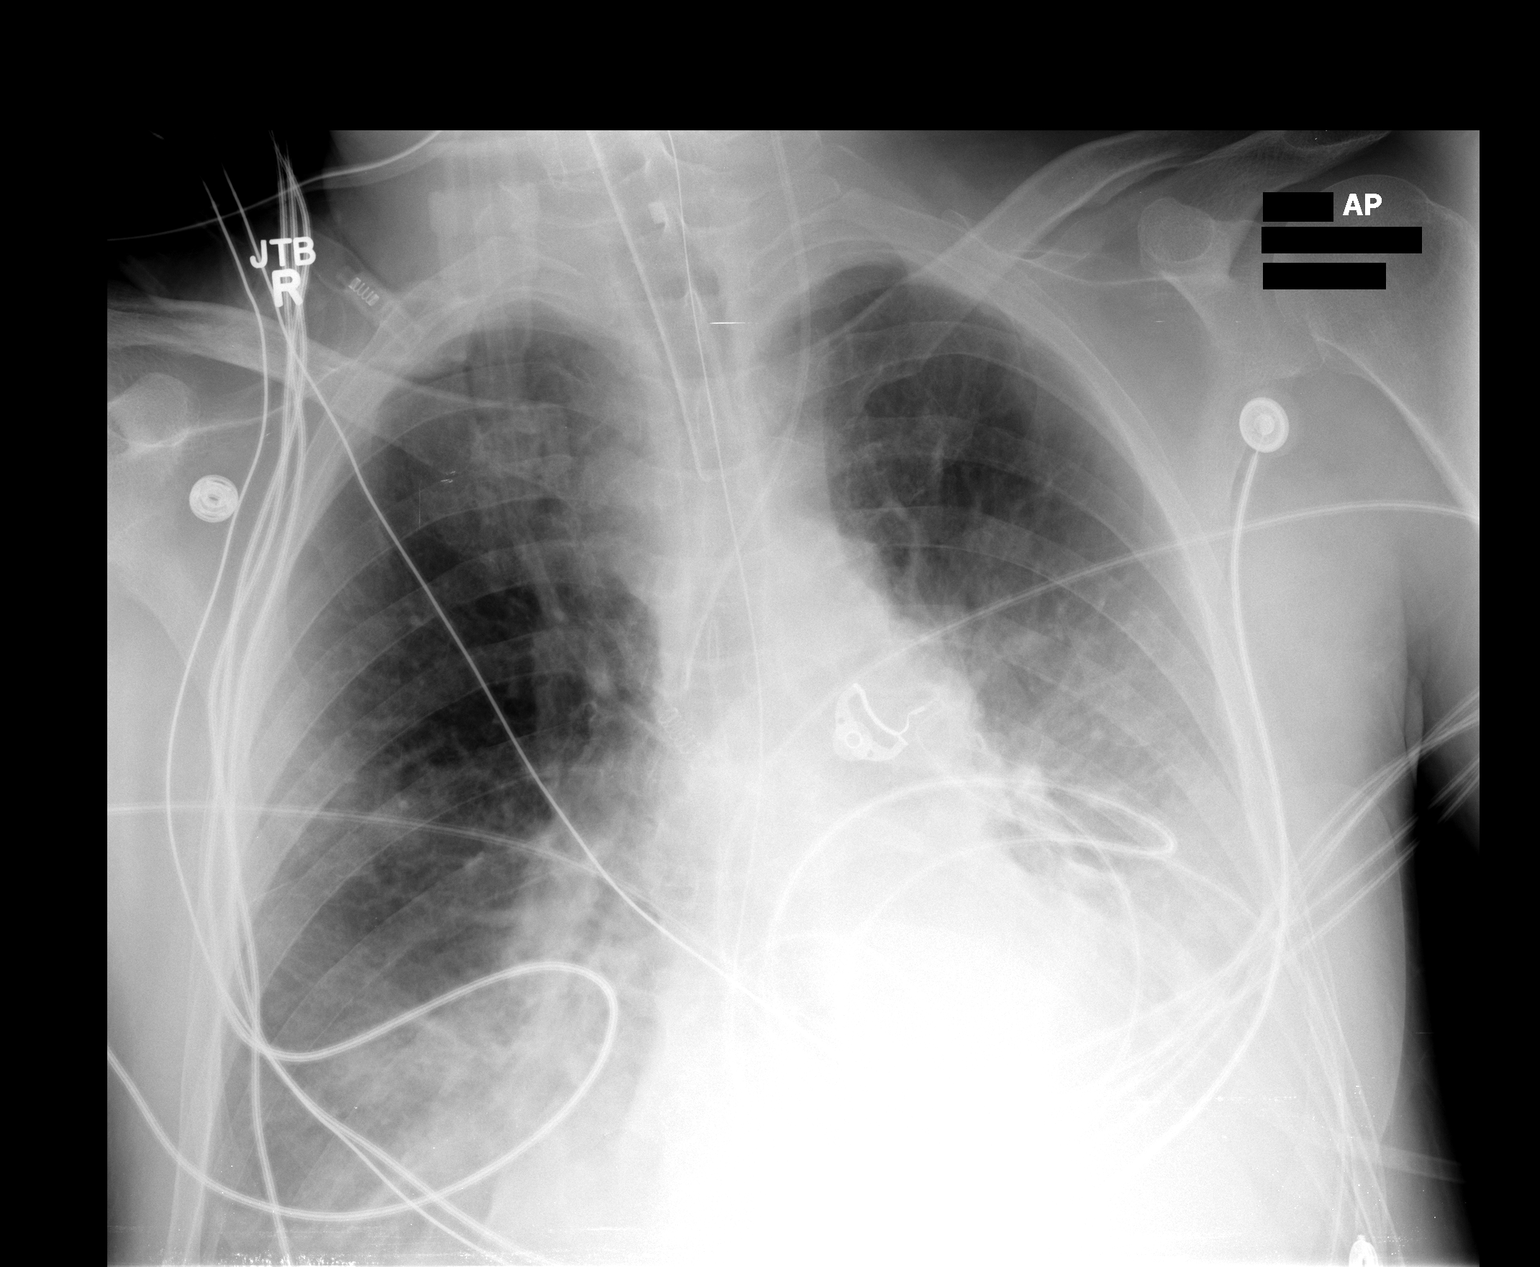

[1 of 1 positions shown; findings below may reference images not displayed]

FINDINGS: An endotracheal tube is in place with tip 6.4 cm above
the carina. There is a left-sided internal jugular central venous
catheter with tip terminating in the mid superior vena cava. A
nasogastric tube is seen extending into the stomach, however, the
tip of the nasogastric tube extends below the lower margin of the
image.  Significantly worsening bibasilar opacities may reflect a
combination of atelectasis and/or consolidation, likely with
moderate left and small right-sided pleural effusions.  There
appears to be only mild pulmonary venous congestion.  Cardiac
silhouette is completely obscured.  Mediastinal contours are
unremarkable allowing for the mild patient rotation to the right.
IMPRESSION: 1.  Significantly worsened bibasilar aeration which may represent
areas of atelectasis and/or consolidation (from a large volume
aspiration or developing multilobar infection).  There are also
moderate left and small right-sided pleural effusions which have
significantly increased.
2.  Support apparatus, as above.

## 2012-08-05 DEATH — deceased

## 2012-08-07 IMAGING — CR DG CHEST 1V PORT
1 series · 1 of 1 positions shown · non-contrast
Comparison: 1 day prior

CLINICAL DATA: Shortness of breath.  Pulmonary edema.  Evaluate
endotracheal tube.

PORTABLE CHEST - 1 VIEW

[AP]
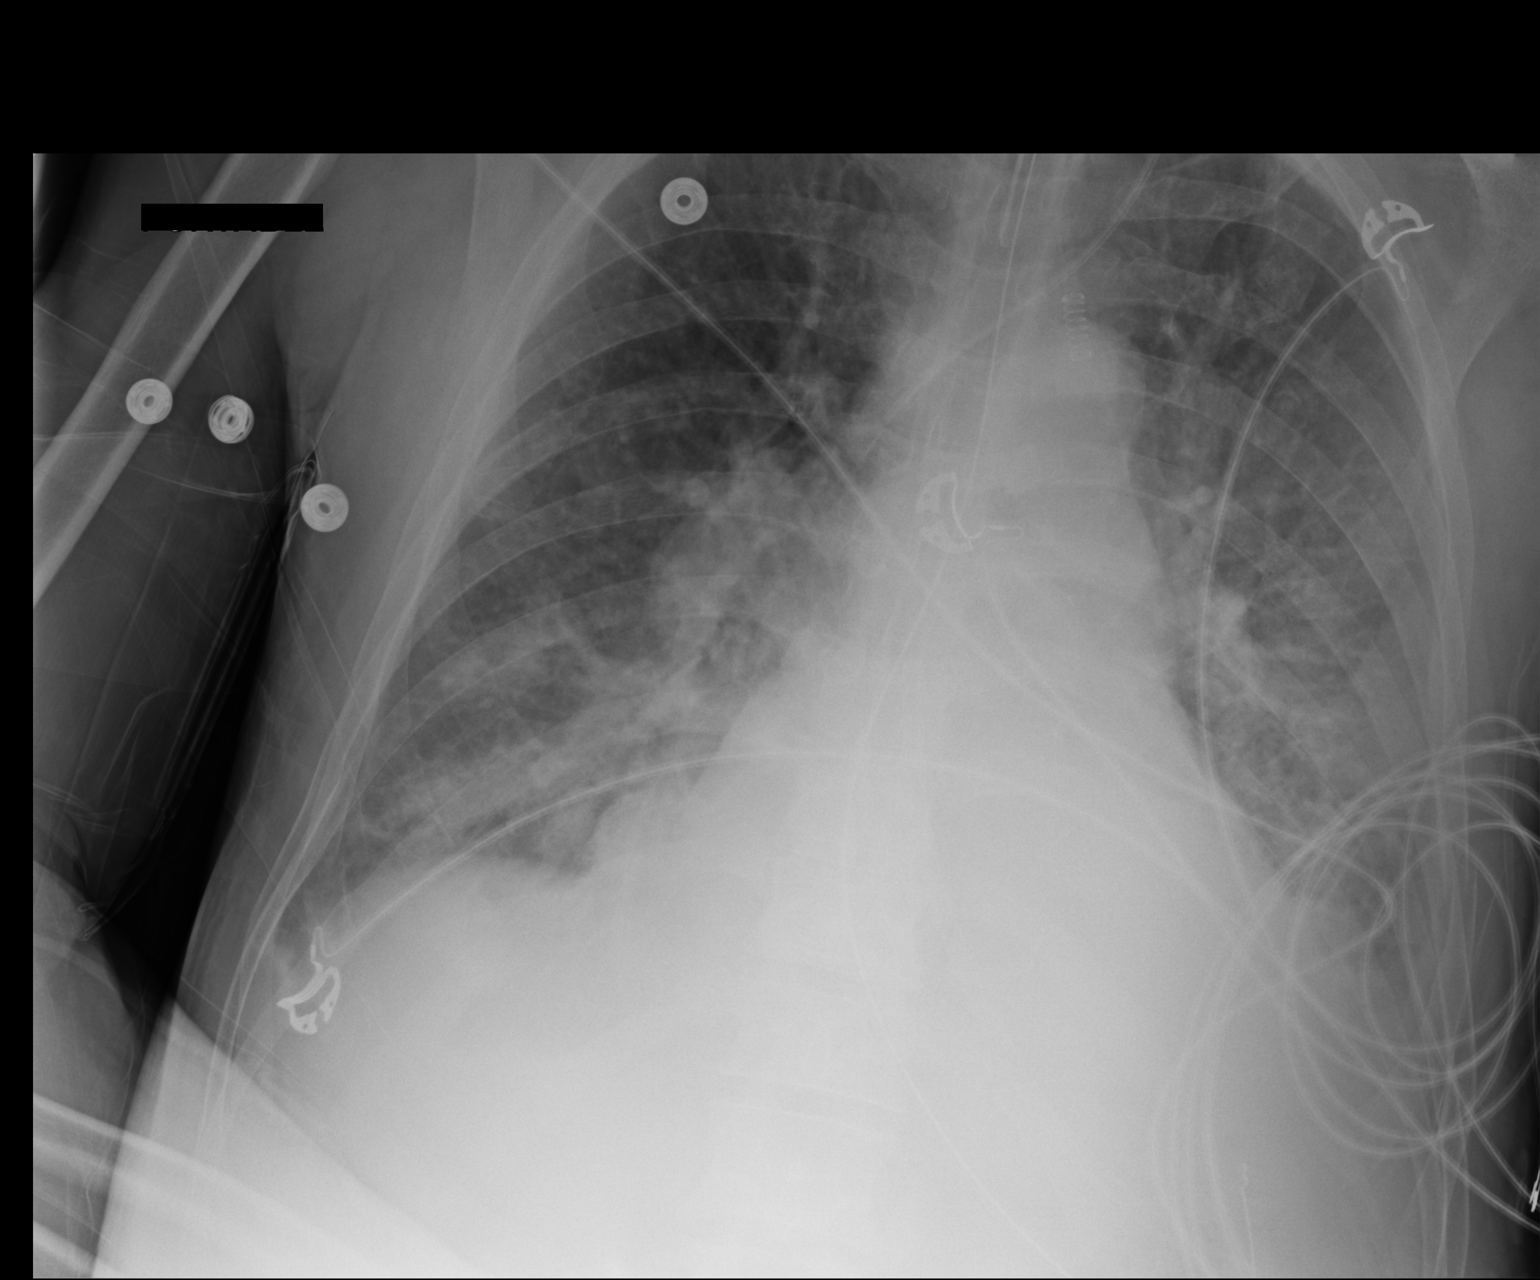

[1 of 1 positions shown; findings below may reference images not displayed]

FINDINGS: Endotracheal tube terminates at the level of the
clavicles.  Nasogastric extends beyond the  inferior aspect of the
film.  Left IJ central line with tip at mid SVC.

Mild cardiomegaly.  Layering small left pleural effusion. Numerous
leads and wires project over the chest.  No pneumothorax.  Moderate
interstitial edema is similar.  Bibasilar airspace disease is also
not significantly changed.
IMPRESSION: 1. No significant change since one day prior.
2.  Interstitial edema with bibasilar airspace disease and a small
left pleural effusion.

## 2012-08-07 IMAGING — CT CT ABD-PELV W/O CM
2 of 4 series · 16 of 46 positions shown, 18 images · non-contrast
Comparison: CT of abdomen and pelvis 04/03/2011.

CLINICAL DATA: Synovitis bacteremia.  Evaluate for potential
fistula.

CT ABDOMEN AND PELVIS WITHOUT CONTRAST
TECHNIQUE: Multidetector CT imaging of the abdomen and pelvis was
performed following the standard protocol without intravenous
contrast.

[Series 2: abd/pelv w/o 5.0 b31f st · axial · non-contrast · 0.84mm/px · z∈[-456,-16]mm · 13 of 97 slices shown, 15 images]
[im 5/97  soft-tissue]
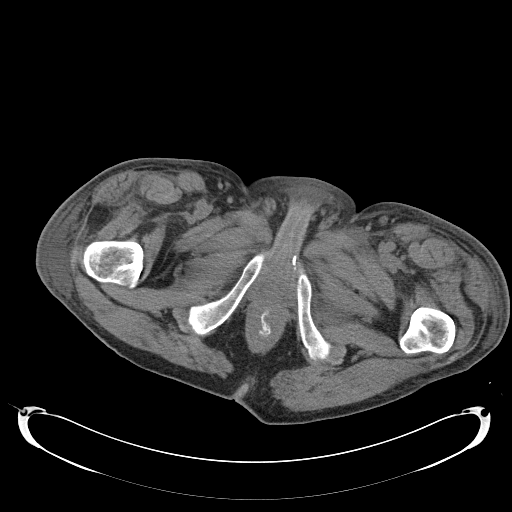
[im 5/97  bone]
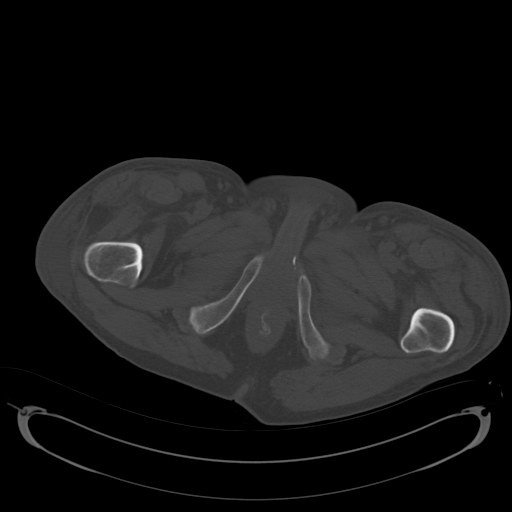
[im 13/97  soft-tissue]
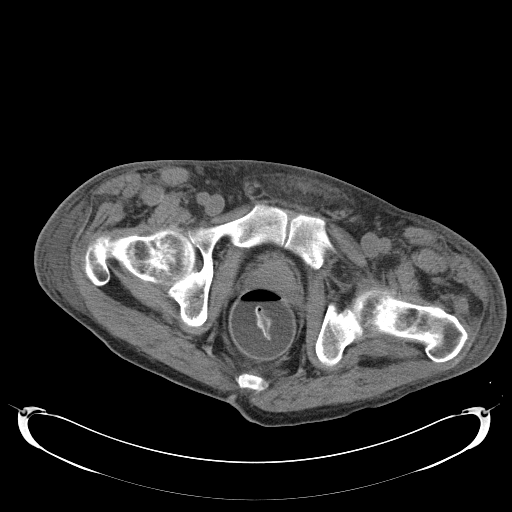
[im 21/97  soft-tissue]
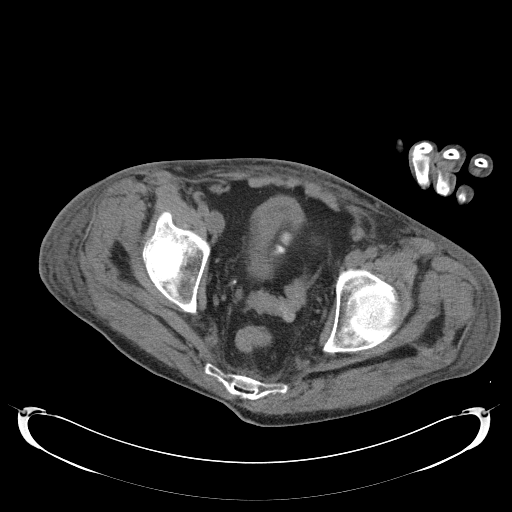
[im 29/97  soft-tissue]
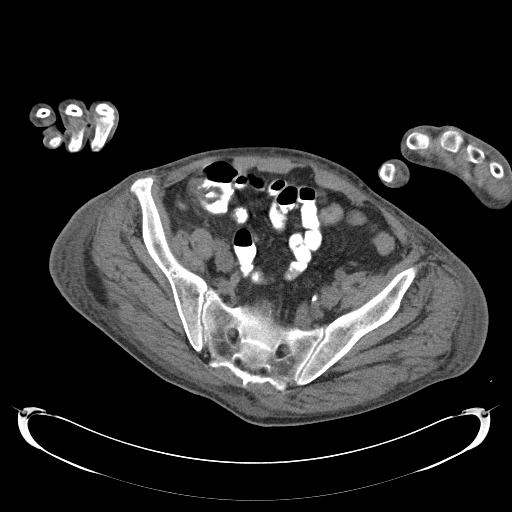
[im 33/97  soft-tissue]
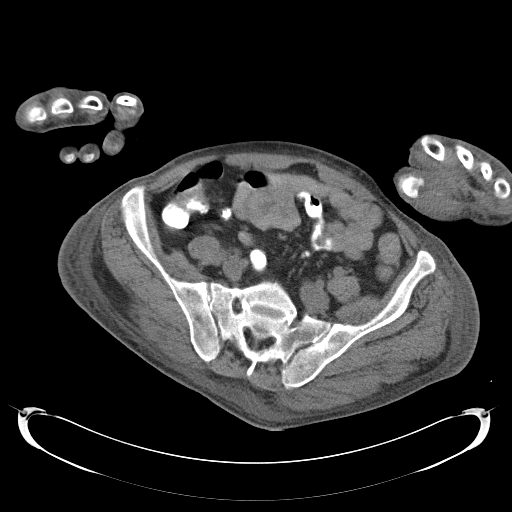
[im 41/97  soft-tissue]
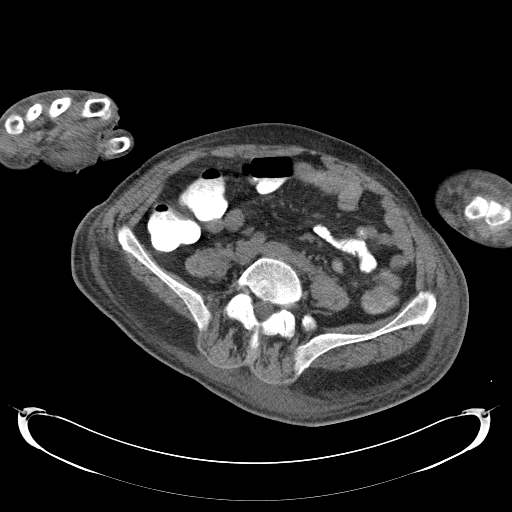
[im 49/97  soft-tissue]
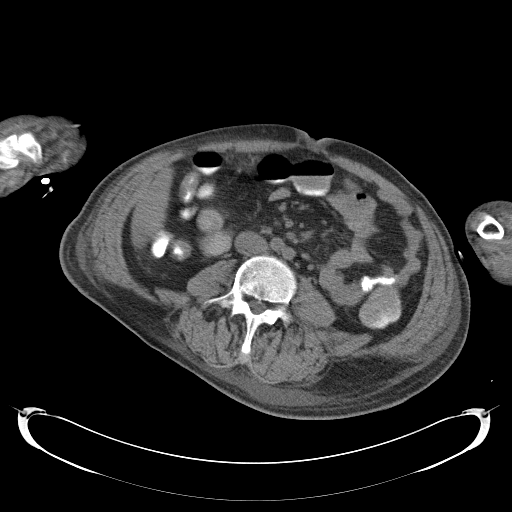
[im 57/97  soft-tissue]
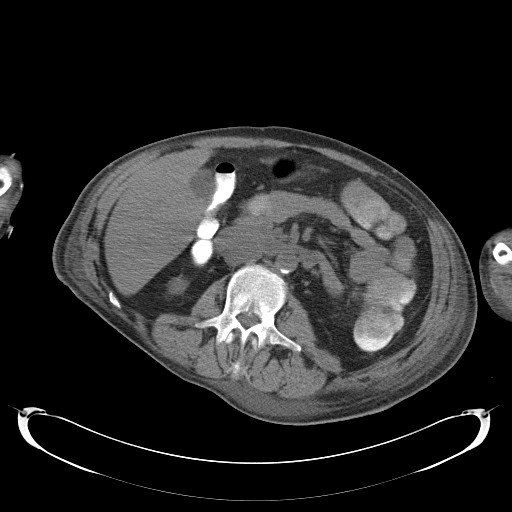
[im 65/97  soft-tissue]
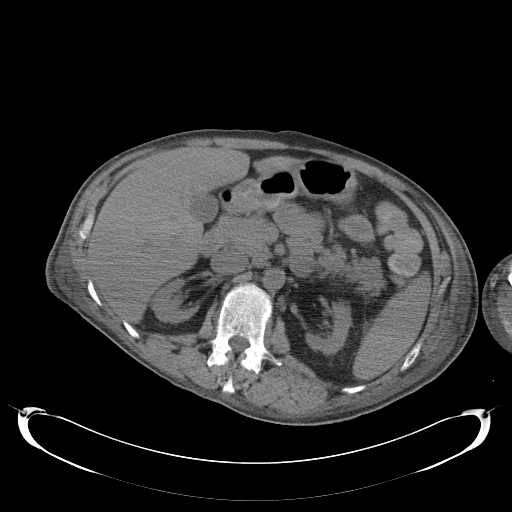
[im 65/97  bone]
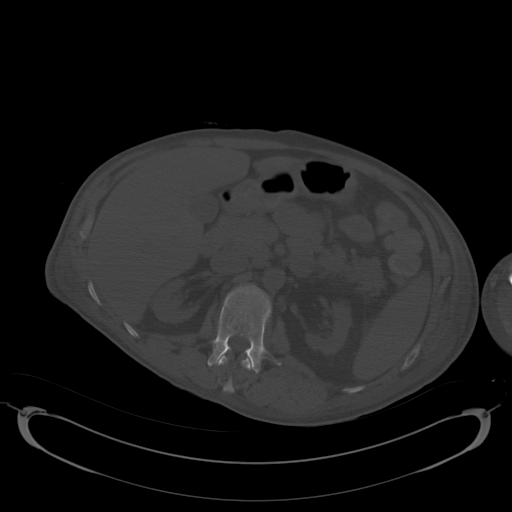
[im 69/97  soft-tissue]
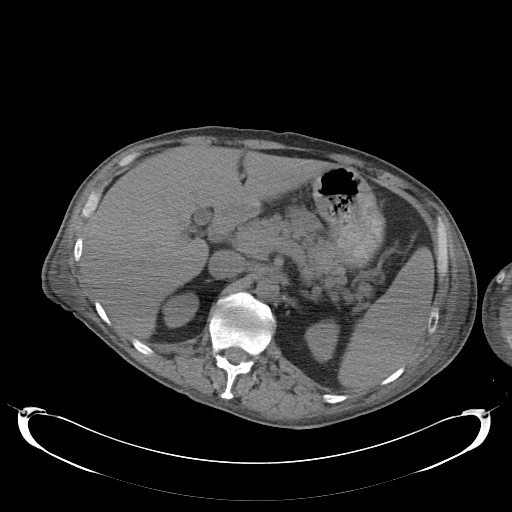
[im 77/97  soft-tissue]
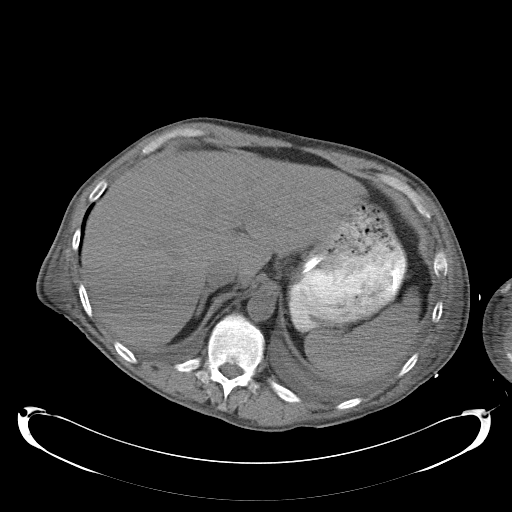
[im 85/97  soft-tissue]
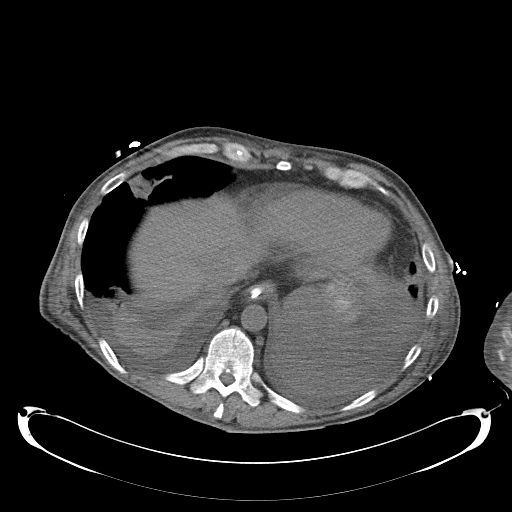
[im 93/97  soft-tissue]
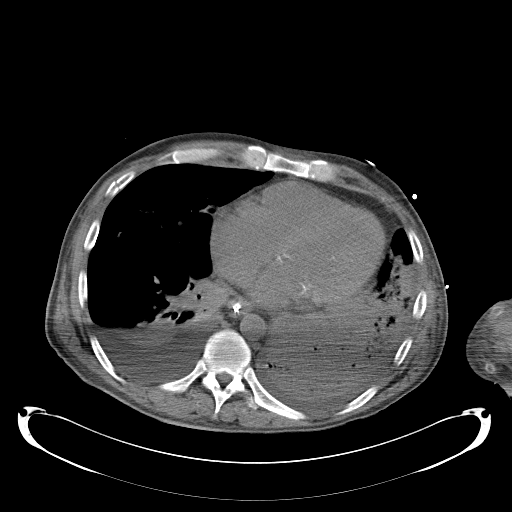

[Series 5: coronals · coronal · 1.04mm/px · 3 of 113 slices shown]
[im 38/113  soft-tissue]
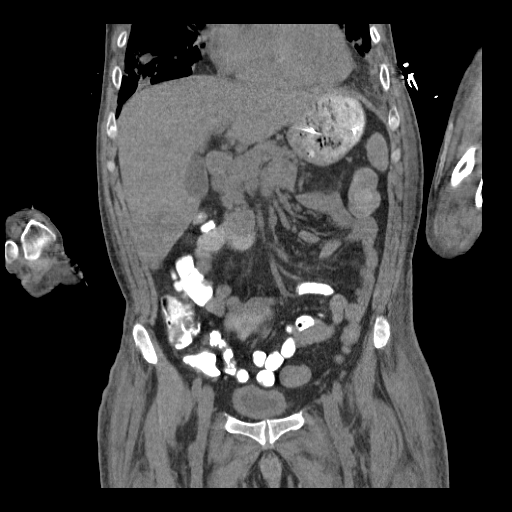
[im 50/113  soft-tissue]
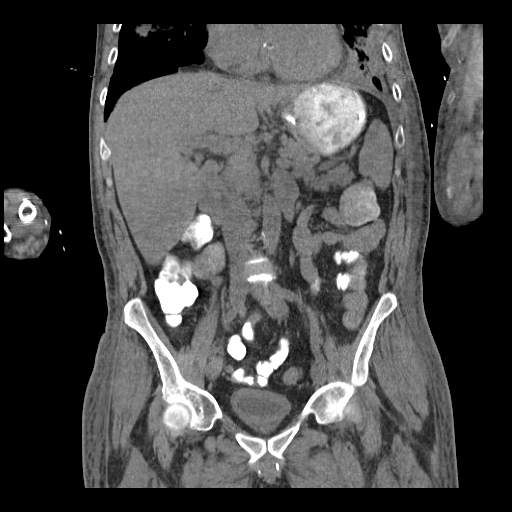
[im 63/113  soft-tissue]
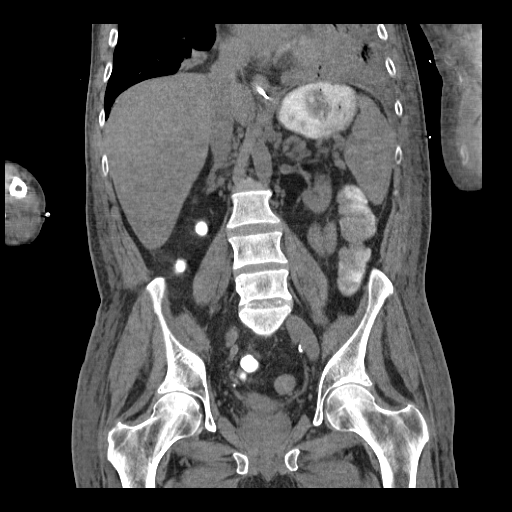

[16 of 46 positions shown; findings below may reference images not displayed]

FINDINGS: Lung Bases: Extensive air space consolidation throughout the lung
bases bilaterally, including what appears to be complete left lower
lobe consolidation, with patchy air space disease in the inferior
aspect of the lingula, right middle lobe and right lower lobe.
Some dependent atelectasis is also noted in the right lower lobe.
Small bilateral pleural effusions are incompletely visualized.
Calcifications of the mitral annulus.

Abdomen/Pelvis:  The unenhanced appearance of the liver,
gallbladder, pancreas and bilateral adrenal glands is unremarkable.
The kidneys are atrophic bilaterally, but otherwise unremarkable.
Spleen is mildly enlarged (14.1 cm), increased compared to the
prior examination.

A rectal bag is in place.  No pathologic distension of bowel.  No
ascites or pneumoperitoneum.  No definite pathologic
lymphadenopathy identified within the abdomen or pelvis.
Atherosclerosis throughout the abdominal and pelvic vasculature,
without definite aneurysm.  Urinary bladder is unremarkable in
appearance.

Musculoskeletal: There are no aggressive appearing lytic or blastic
lesions noted in the visualized portions of the skeleton.
Nondisplaced fractures of the anterolateral aspect of the right
sixth, seventh and eighth ribs are incidentally noted.
IMPRESSION: 1.  No acute findings in the abdomen or pelvis.
2.  Extensive air space consolidation throughout the visualized
lung bases, particularly in the left lower lobe where there is near
complete consolidation of the visualized lung. This presumably
represents severe multilobar pneumonia. In addition, there are
small bilateral pleural effusions.
3.  There is splenomegaly which has increased compared to prior
examination.
4.  Bilateral renal atrophy.
5.  Acute nondisplaced fractures of the anterolateral aspect of the
right 8th-4th ribs.

## 2012-08-08 IMAGING — CR DG CHEST 1V PORT
1 series · 1 of 1 positions shown · non-contrast
Comparison: the previous day's study

CLINICAL DATA: Pleural effusion

PORTABLE CHEST - 1 VIEW

[AP]
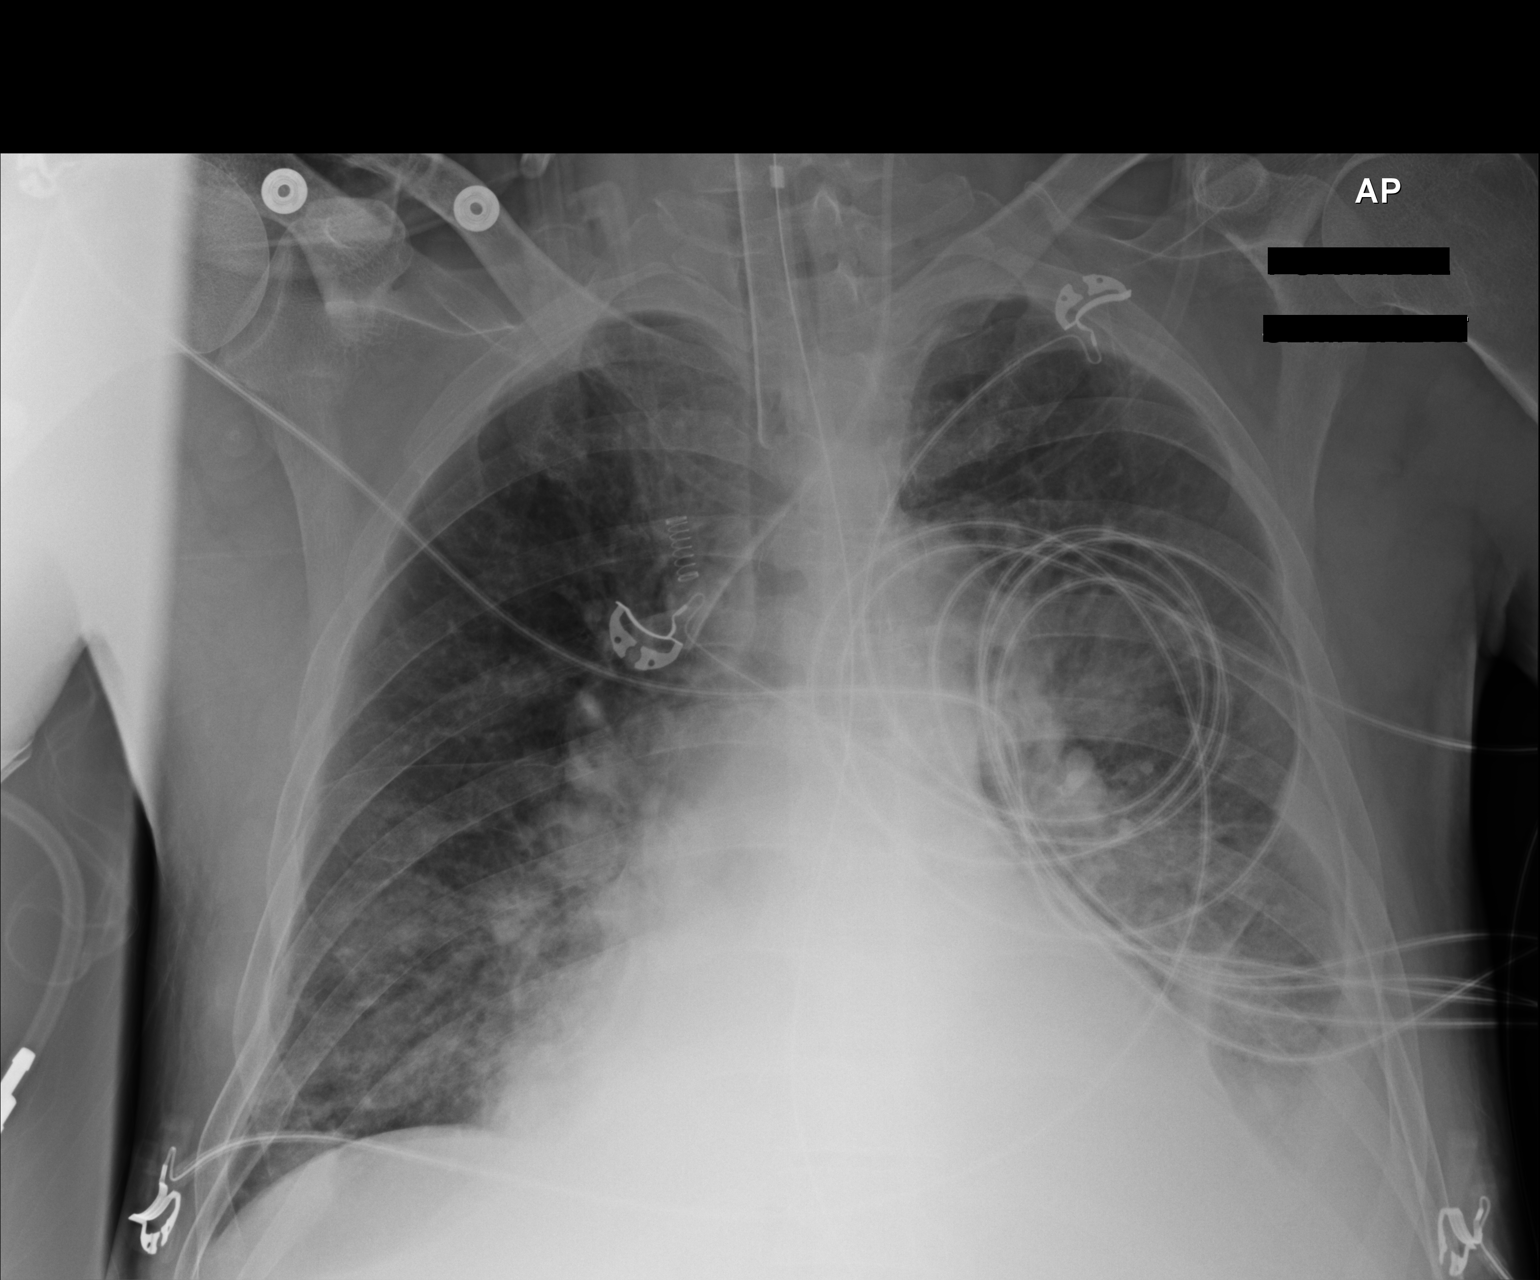

[1 of 1 positions shown; findings below may reference images not displayed]

FINDINGS: Endotracheal tube, nasogastric tube, and left IJ central
line are stable in position.  Mild perihilar and bibasilar
interstitial edema, improved since previous exam.  Persistent
central pulmonary vascular congestion.  Small left pleural effusion
with adjacent atelectasis/consolidation at the left lung base as
before.  Stable cardiomegaly.
IMPRESSION: 1.  Some interval improvement in interstitial edema.
2.  Persistent cardiomegaly and small left effusion.
3. Support hardware stable in position.

## 2012-08-09 IMAGING — CR DG CHEST 1V PORT
1 series · 1 of 1 positions shown · non-contrast
Comparison: the previous day's study

CLINICAL DATA: Pneumonia

PORTABLE CHEST - 1 VIEW

[AP]
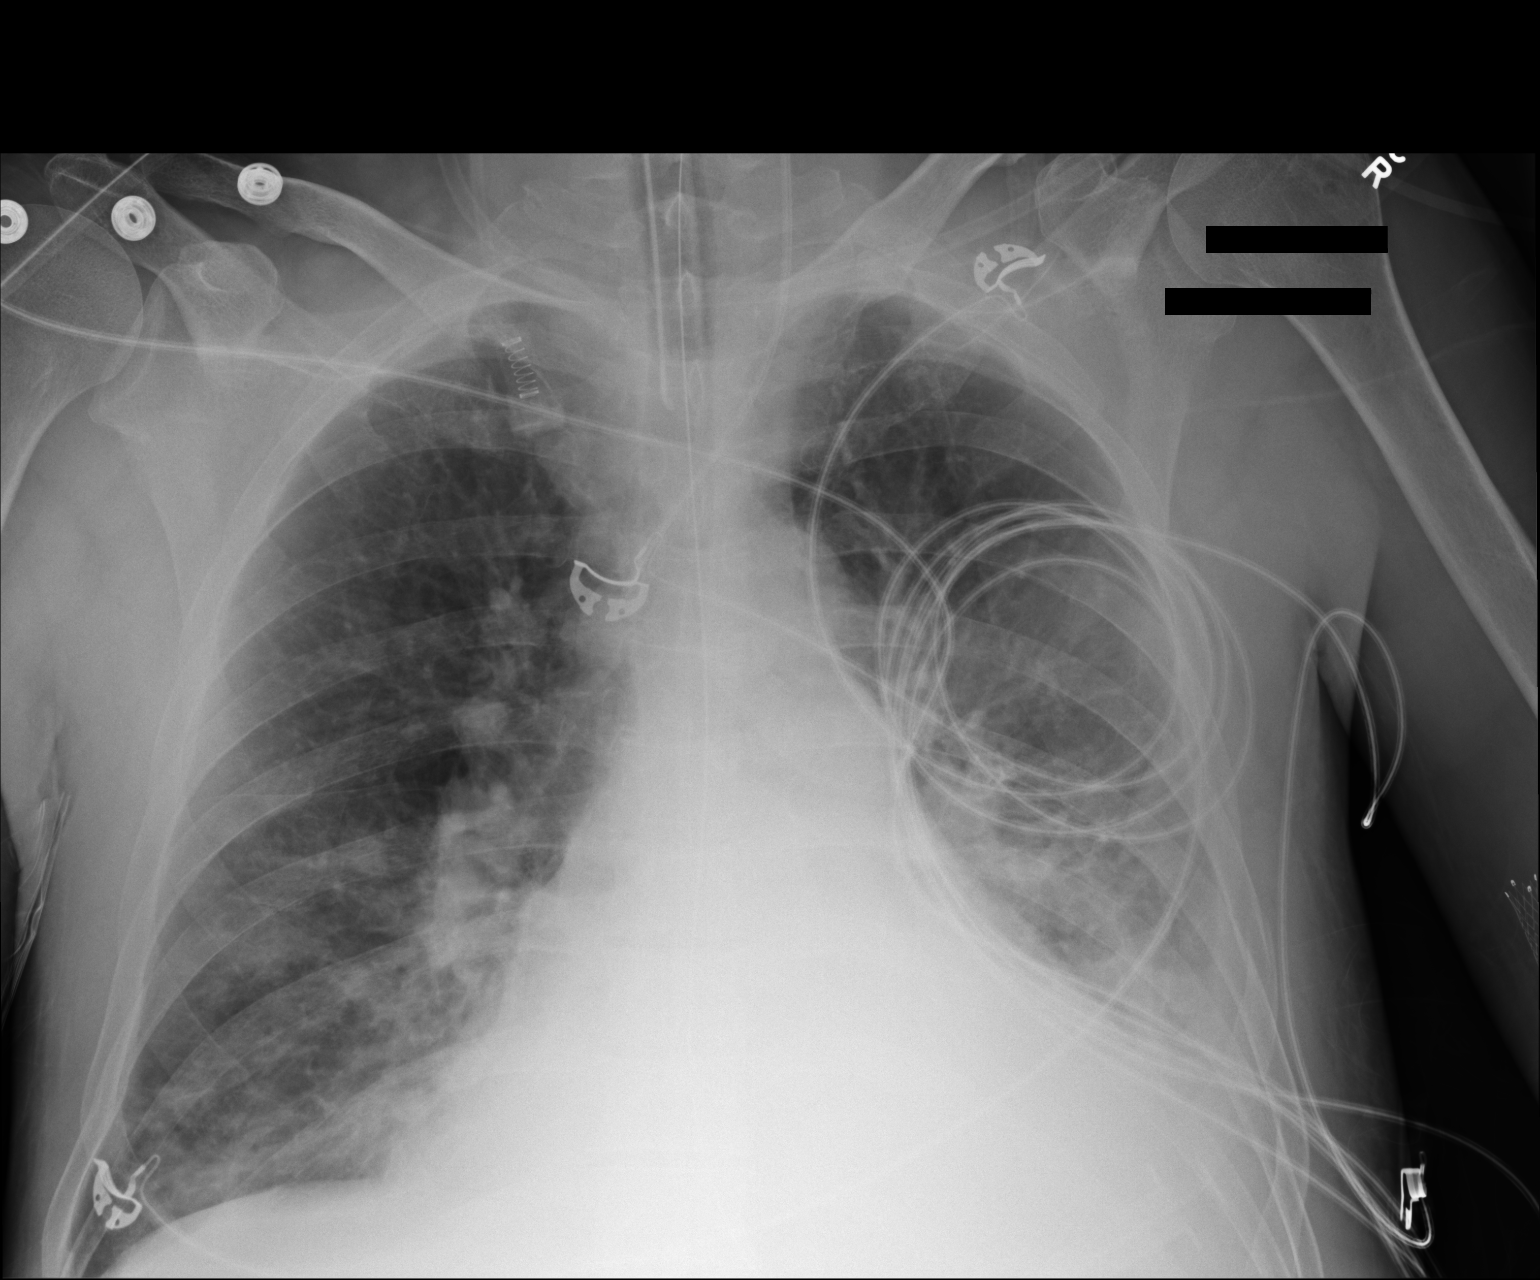

[1 of 1 positions shown; findings below may reference images not displayed]

FINDINGS: Endotracheal tube, nasogastric tube, and left IJ central
line are stable in position.  Dense left lower lung consolidation /
atelectasis with probable adjacent effusion.  Mild perihilar and
bibasilar interstitial and patchy alveolar edema or infiltrates as
before.  Heart size appears mildly enlarged.
IMPRESSION: 1.  Little change from previous day's portable exam.

## 2012-08-10 IMAGING — CR DG CHEST 1V PORT
1 series · 1 of 1 positions shown · non-contrast
Comparison: 02/02/2012

CLINICAL DATA: Pneumonia

PORTABLE CHEST - 1 VIEW

[AP]
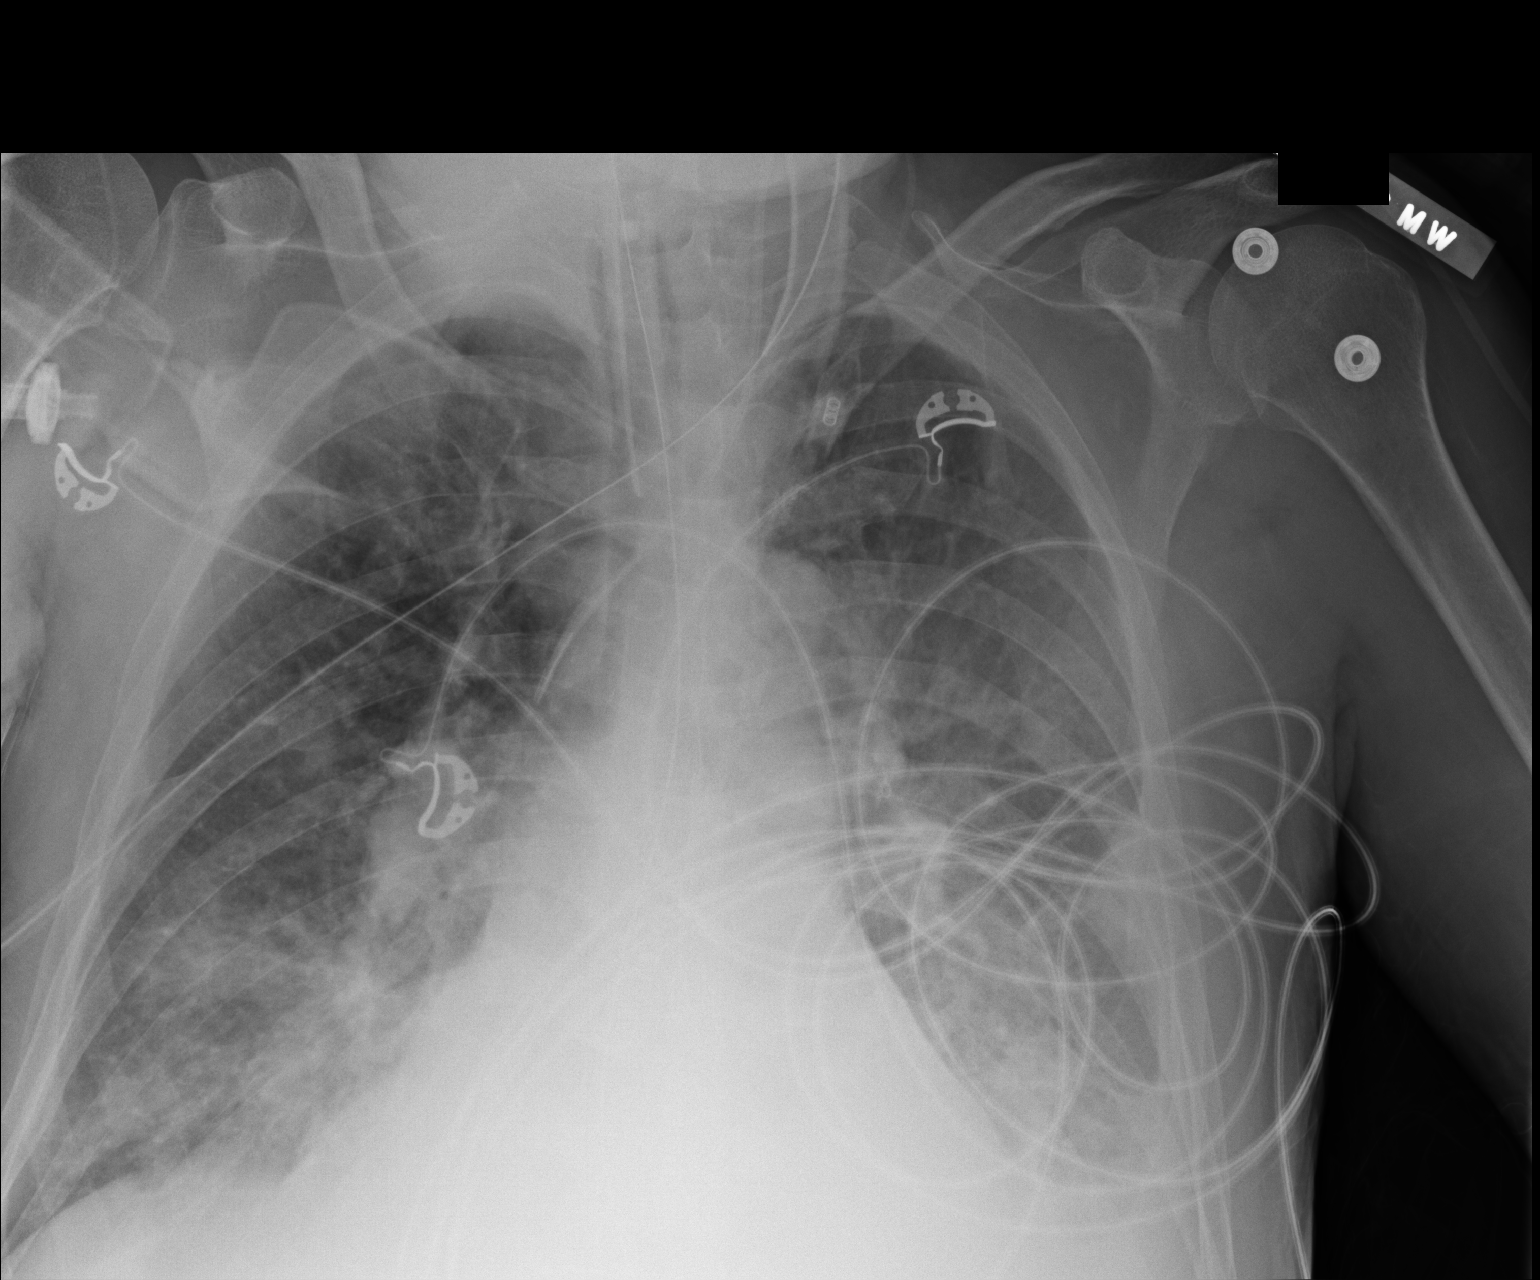

[1 of 1 positions shown; findings below may reference images not displayed]

FINDINGS: Endotracheal tube, NG tube, left internal jugular vein
center venous catheter are stable.  Diffuse but predominately
central basilar airspace disease and vascular congestion are worse.
Haziness at the left base unchanged.  No pneumothorax.
IMPRESSION: Worsening diffuse airspace disease or pulmonary edema.

Left basilar opacity is not significantly changed, but keep in mind
that airspace disease has become more homogenized and diffuse.

## 2012-08-11 IMAGING — CR DG CHEST 1V PORT
1 series · 1 of 1 positions shown · non-contrast
Comparison: 02/03/2012.

CLINICAL DATA: Assess endotracheal tube.  Respiratory failure.

PORTABLE CHEST - 1 VIEW

[AP]
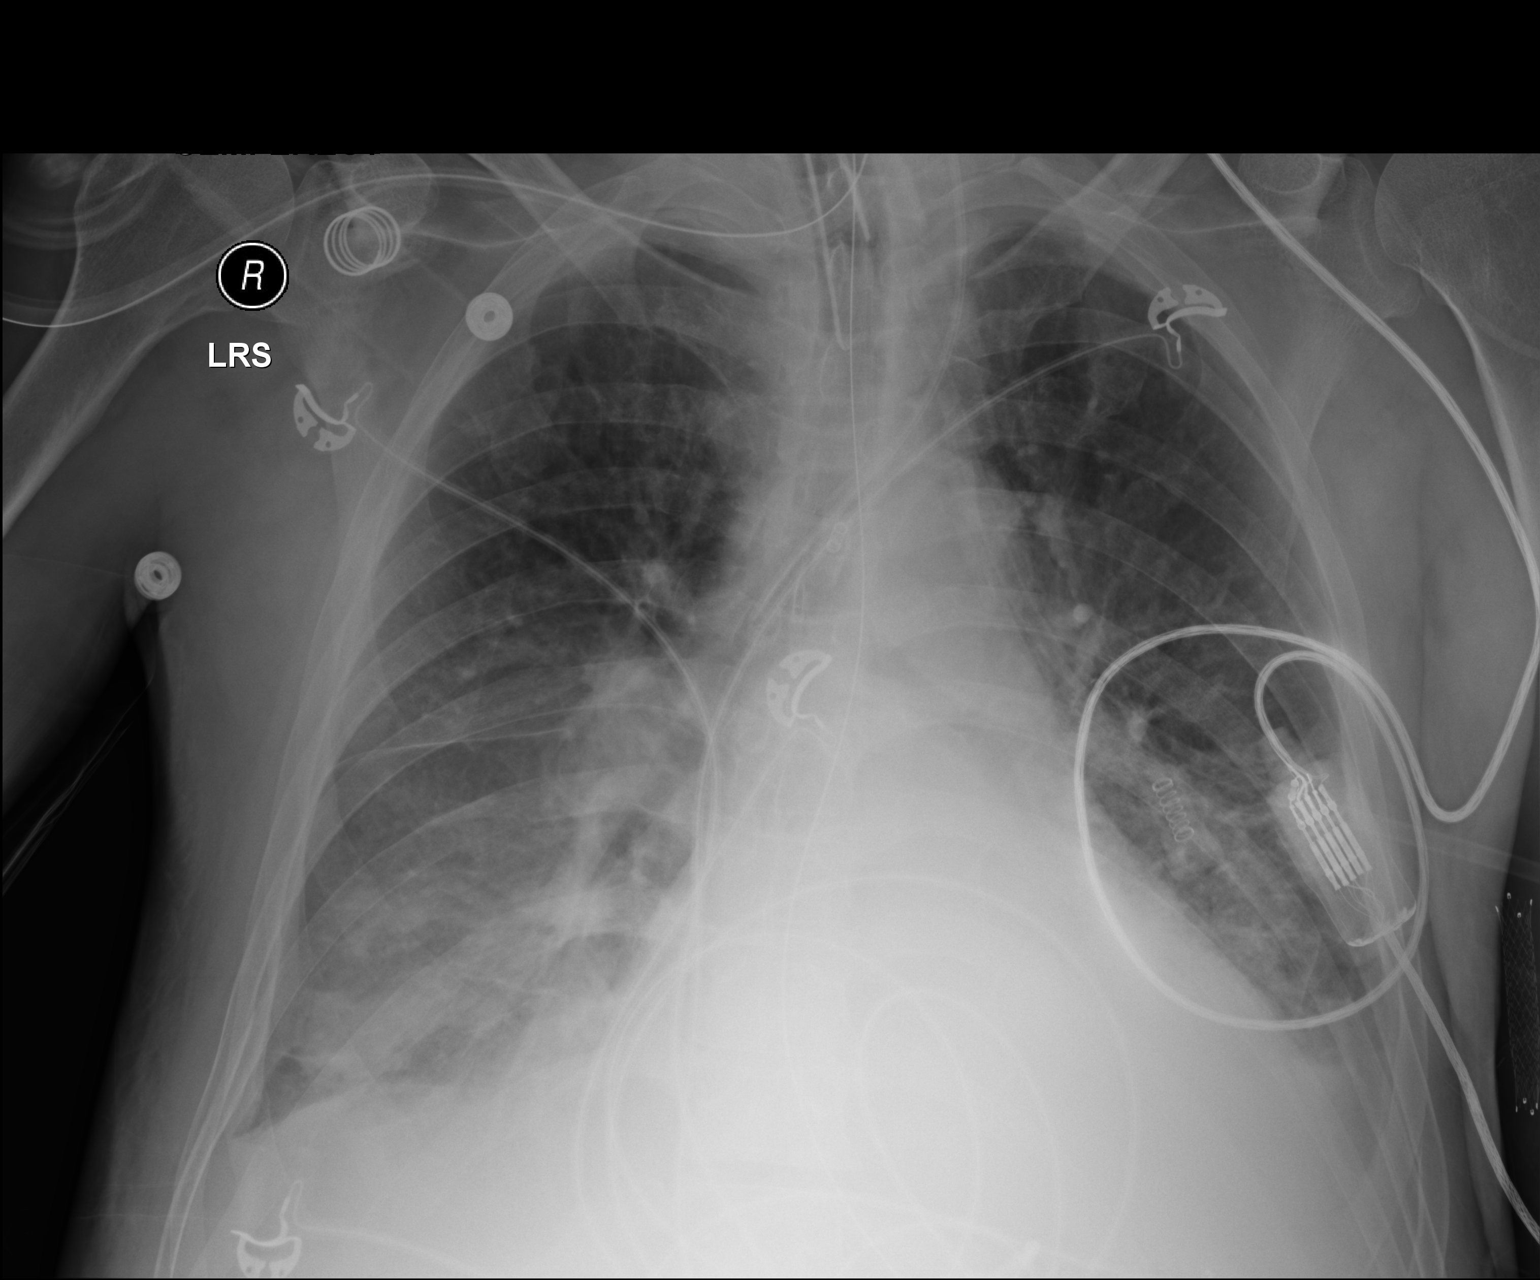

[1 of 1 positions shown; findings below may reference images not displayed]

FINDINGS: Endotracheal tube position new high at 82 mm from the
carina.  This is at the level of the clavicles.  Enteric tube
remains present.  Left IJ central line terminates in the upper SVC.
Cardiomegaly, bilateral basilar atelectasis and basilar airspace
disease remains present.  Small bilateral pleural effusions also
present.
IMPRESSION: 1.  Little interval change in pulmonary aeration and support
apparatus.
2.  Endotracheal tube tip 82 mm from the carina.  This could be
advanced for more secure positioning.

## 2012-08-12 IMAGING — CR DG CHEST 1V PORT
1 series · 1 of 1 positions shown · non-contrast
Comparison: Chest radiograph 02/04/2012

CLINICAL DATA: Pulmonary edema

PORTABLE CHEST - 1 VIEW

[AP]
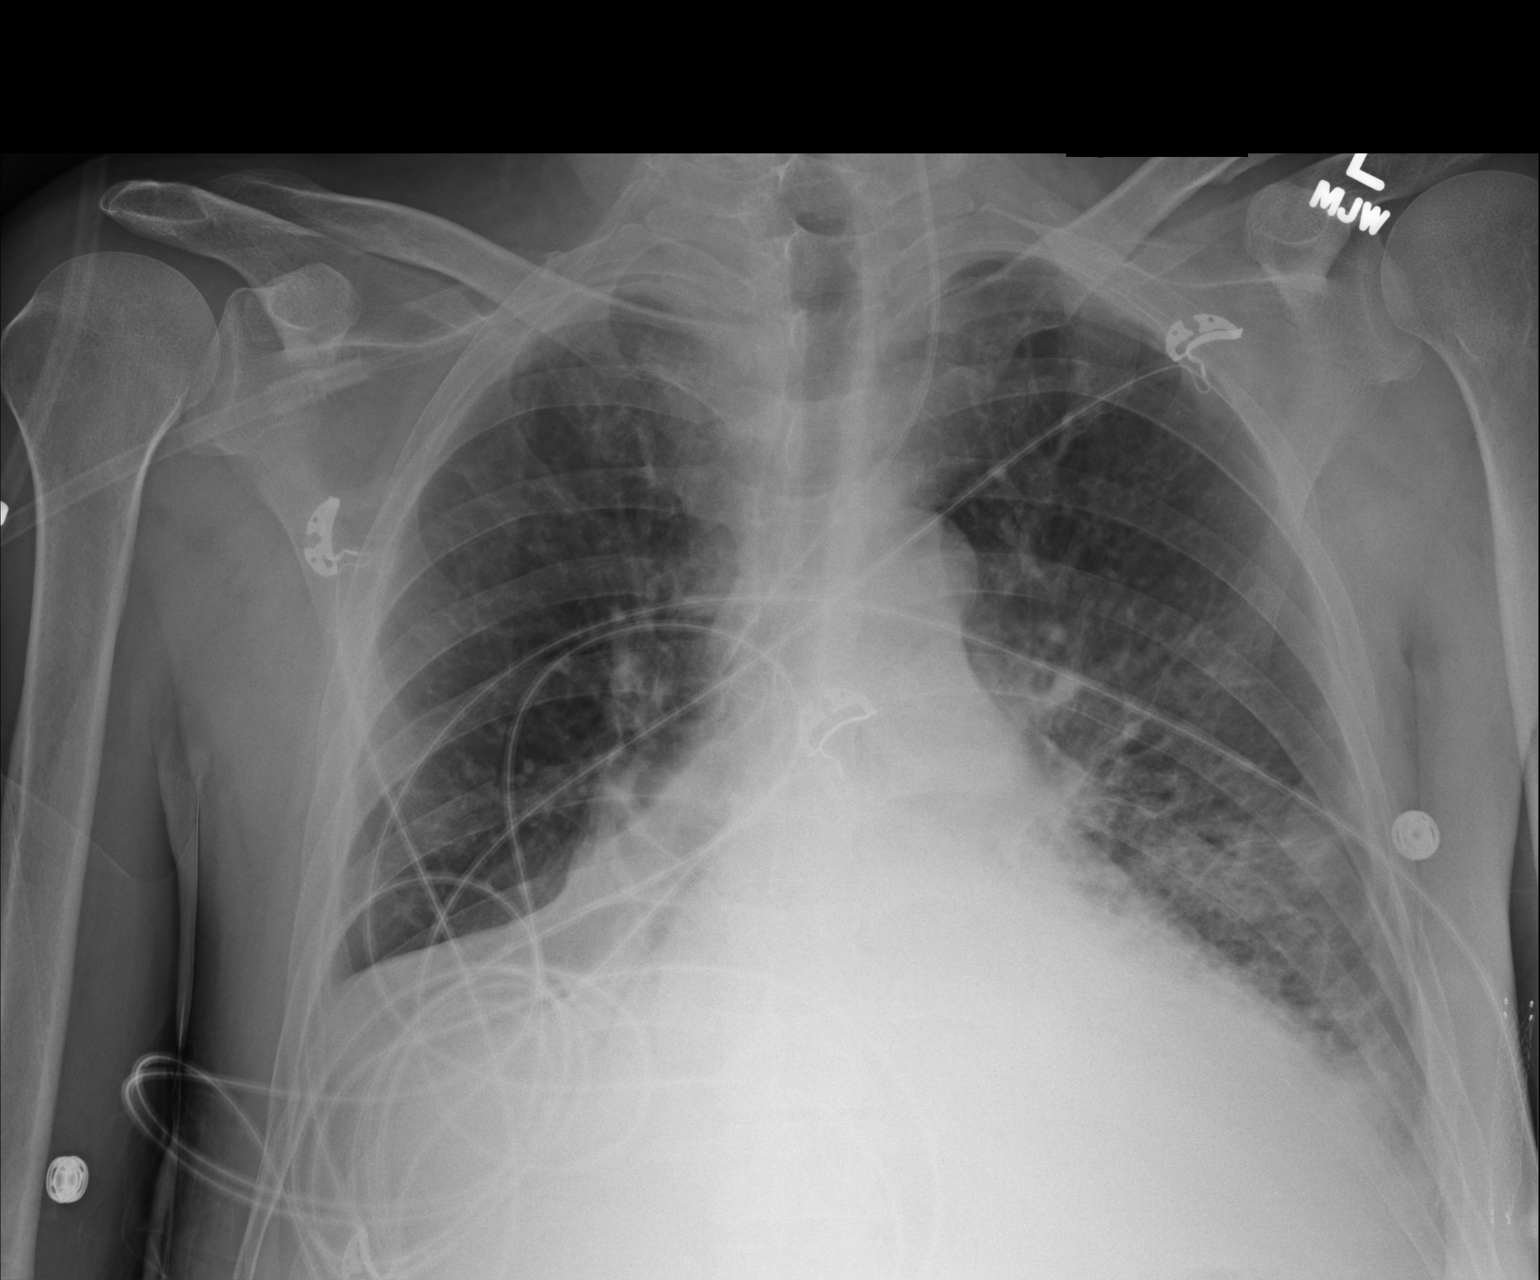

[1 of 1 positions shown; findings below may reference images not displayed]

FINDINGS: Interval extubation and removal of NG tube.  Left venous
line remains.  Stable mildly enlarged heart silhouette.  There is
dense bibasilar atelectasis.  There is a stable air space disease
in left lower lobe.  Increased atelectasis at the right lung base.
No pneumothorax.
IMPRESSION: 1..  New right basilar atelectasis following extubation.
2.  Stable dense left lower lobe atelectasis.
3.  Left basilar air space disease unchanged.

## 2012-08-15 IMAGING — CR DG CHEST 1V PORT
1 series · 1 of 1 positions shown · non-contrast
Comparison: Chest radiograph 02/08/2012

CLINICAL DATA: Confirm endotracheal tube placement

PORTABLE CHEST - 1 VIEW

[AP]
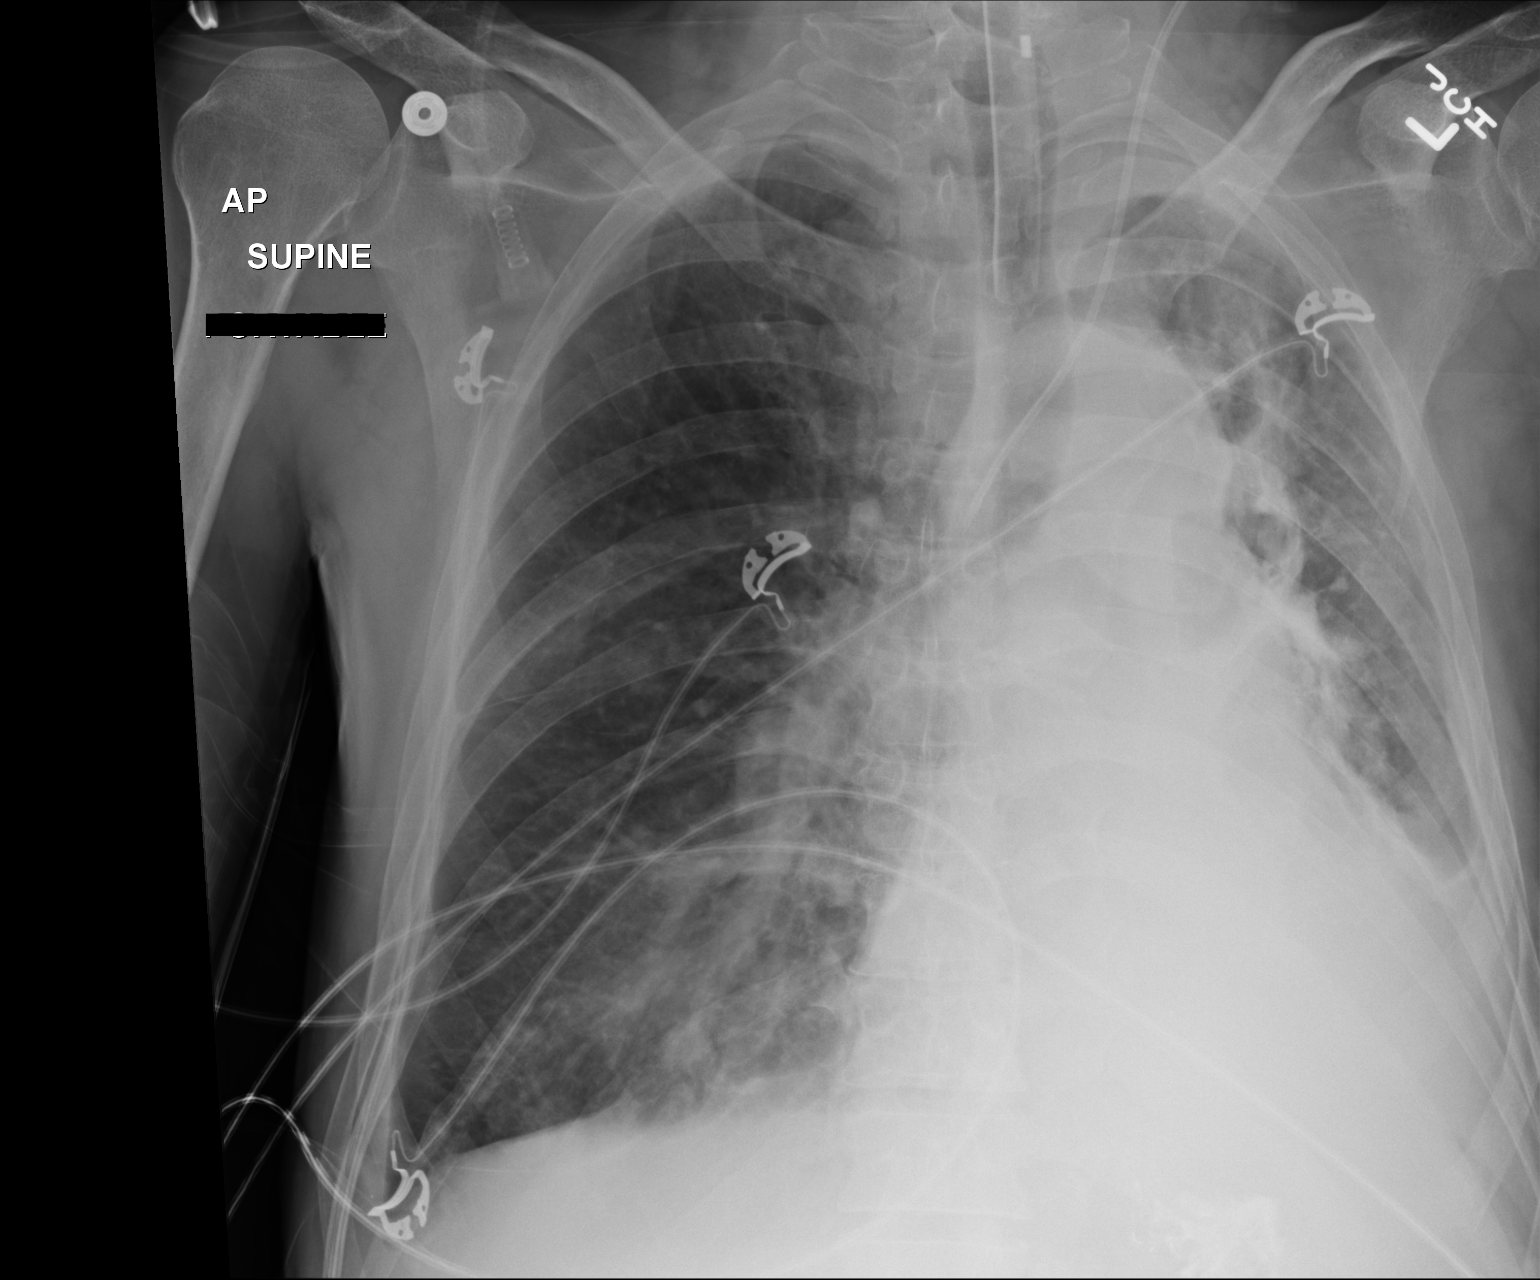

[1 of 1 positions shown; findings below may reference images not displayed]

FINDINGS: Endotracheal tube placed with tip 7 cm from carina in
good position.  Interval placement of a left central venous line
with tip in the mid SVC.  No pneumothorax.  There is dense left
lower lobe atelectasis unchanged from prior.  Right lung is
relatively clear.
IMPRESSION: 1.   Endotracheal tube in good position.

2.  Left central venous line in place without complication.

## 2012-08-15 IMAGING — CR DG CHEST 1V PORT
1 series · 1 of 1 positions shown · non-contrast
Comparison: Chest radiograph performed earlier today at [DATE] p.m.

CLINICAL DATA: Respiratory failure; recent suctioning to remove
mucus plug.

PORTABLE CHEST - 1 VIEW

[AP]
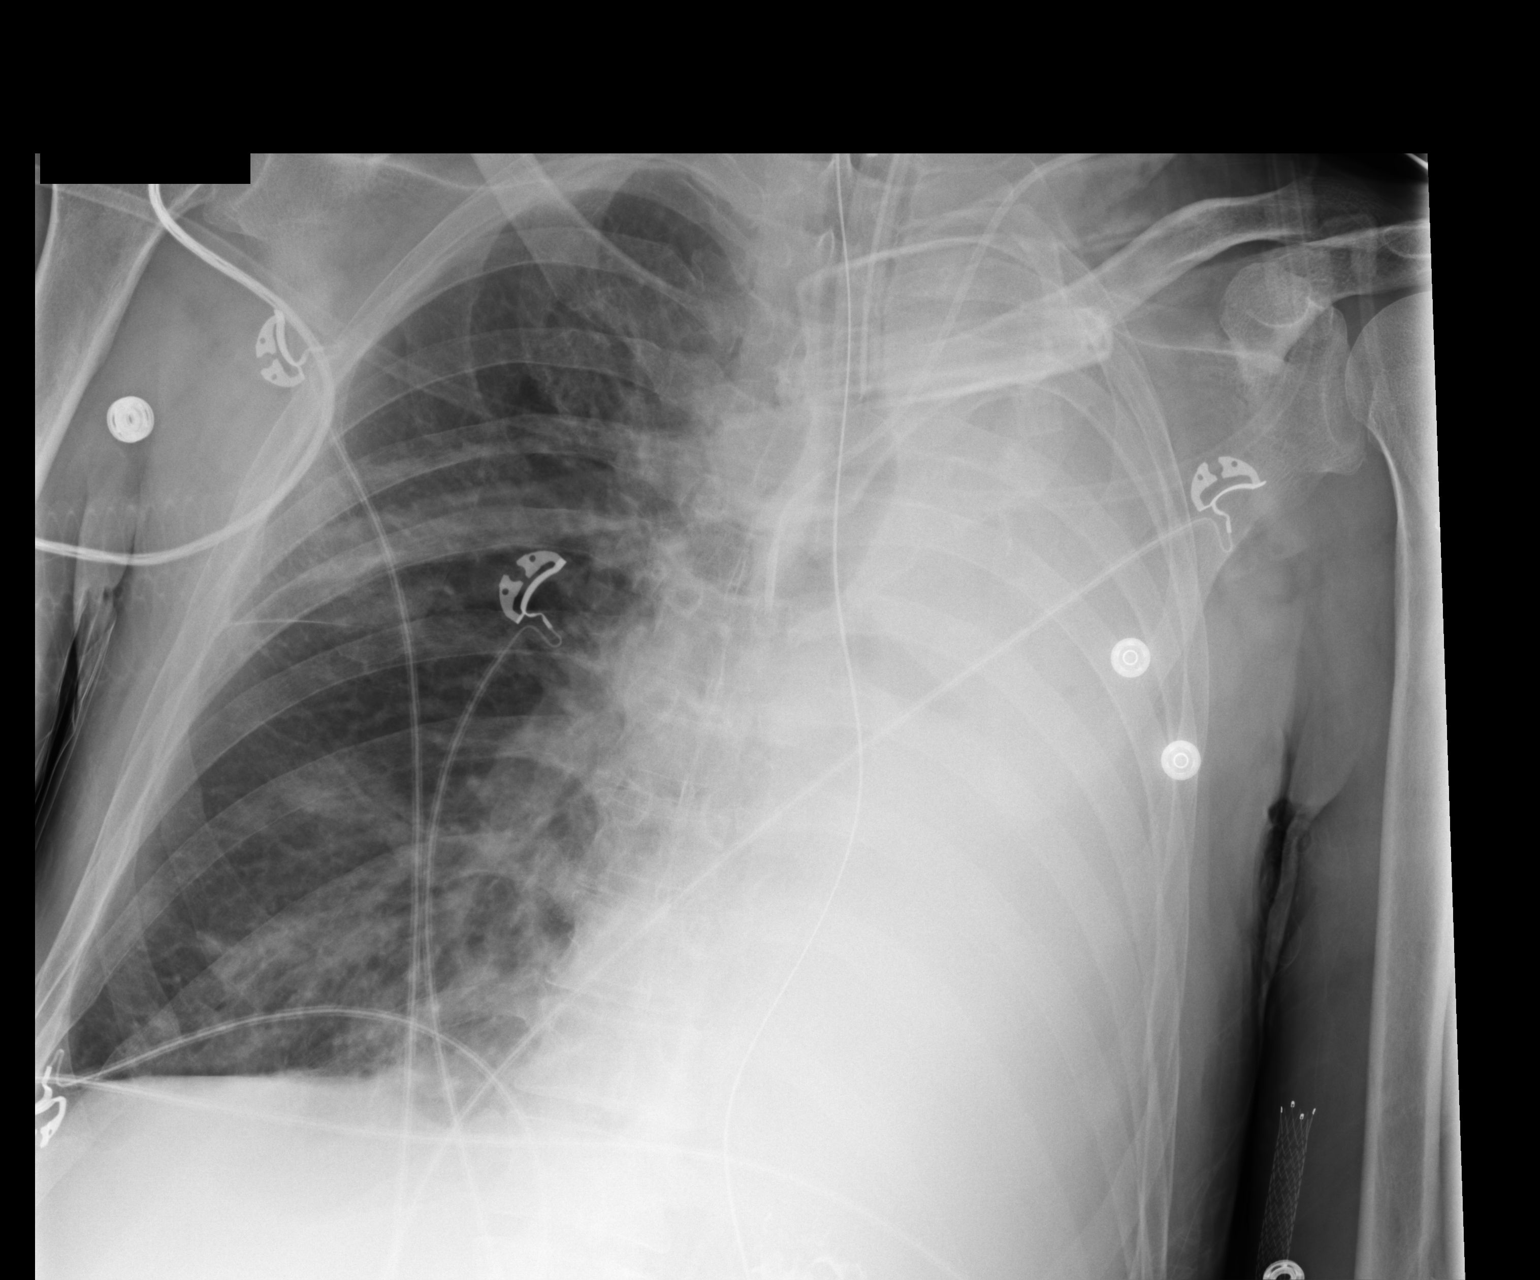

[1 of 1 positions shown; findings below may reference images not displayed]

FINDINGS: The patient's endotracheal tube is seen ending 5-6 cm
above the carina.  An enteric tube is noted extending below the
diaphragm.  A left IJ line is noted ending about the proximal to
mid SVC.

There has been interval complete opacification of the left
hemithorax, with leftward midline shift, compatible with left lung
collapse.  Given the history of mucus plugs, this may reflect a
recurrent mucus plug.  Mild right basilar and right midlung
airspace opacity raises concern for superimposed pneumonia.  A
small left pleural effusion may be present, but is difficult to
fully characterize.  No pneumothorax is seen.

The cardiomediastinal silhouette is not well assessed due to
leftward midline shift.  No acute osseous abnormalities are
identified.
IMPRESSION: 1.  Interval left lung collapse, with leftward midline shift.
Given the history of mucus plugs, this may reflect a recurrent
mucus plug.  Possible small left pleural effusion.
2.  Mild right basilar and right midlung airspace opacity raises
concern for superimposed pneumonia.

These results were called by telephone on 02/08/2012  at  [DATE]
p.m. to  Nursing on [id number], who verbally acknowledged these
results.

## 2012-08-15 IMAGING — CR DG CHEST 1V PORT
1 series · 1 of 1 positions shown · non-contrast
Comparison: 02/05/2012 and previous

CLINICAL DATA: Worsening shortness of breath.

PORTABLE CHEST - 1 VIEW

[AP]
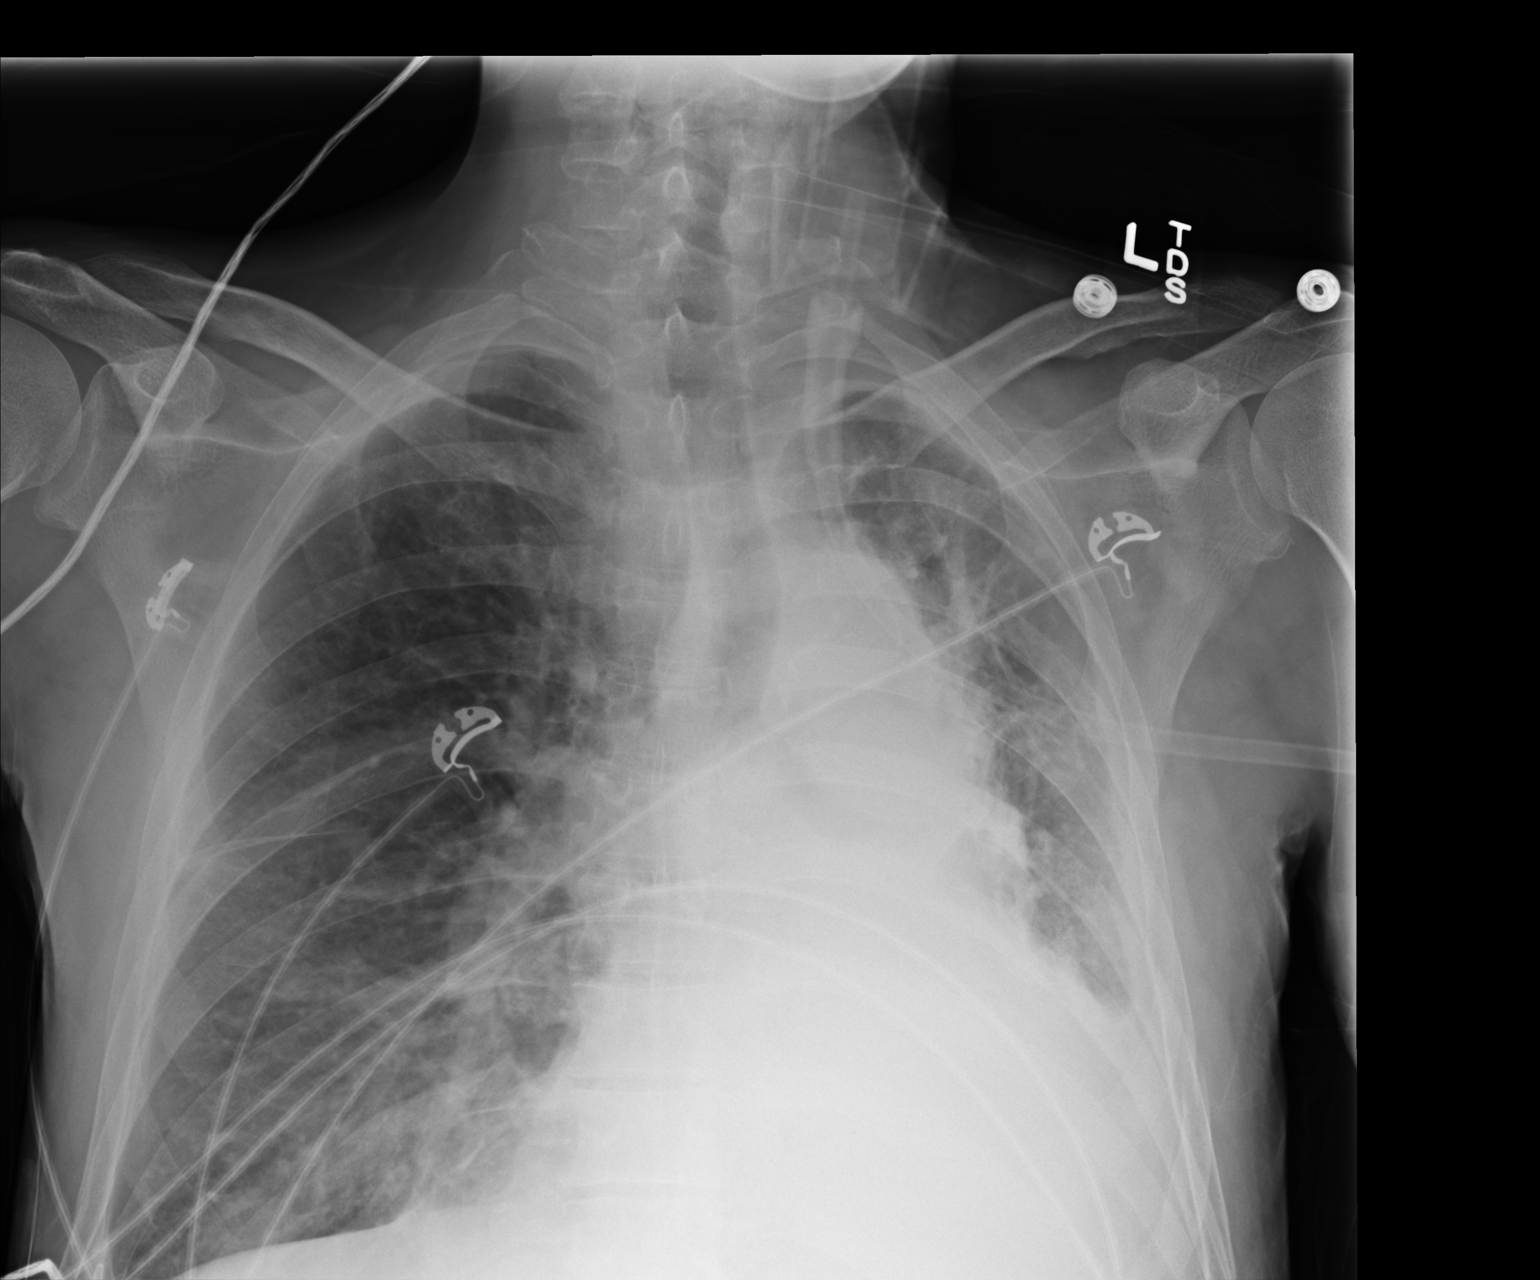

[1 of 1 positions shown; findings below may reference images not displayed]

FINDINGS: Central line has been removed.  There is improved
aeration at the right lung base.  There is worsened aeration in the
left lower lobe.  There is some mediastinal shift towards the left.
There is a background pattern of venous hypertension/early edema.
IMPRESSION: Worsened collapse/consolidation in the left lower lobe.

Improved aeration in the right lower lobe.

Venous hypertension/early edema.

## 2012-08-16 IMAGING — CR DG CHEST 1V PORT
1 series · 1 of 1 positions shown · non-contrast
Comparison: Yesterday.

CLINICAL DATA: Status post bronchoscopy.

PORTABLE CHEST - 1 VIEW

[AP]
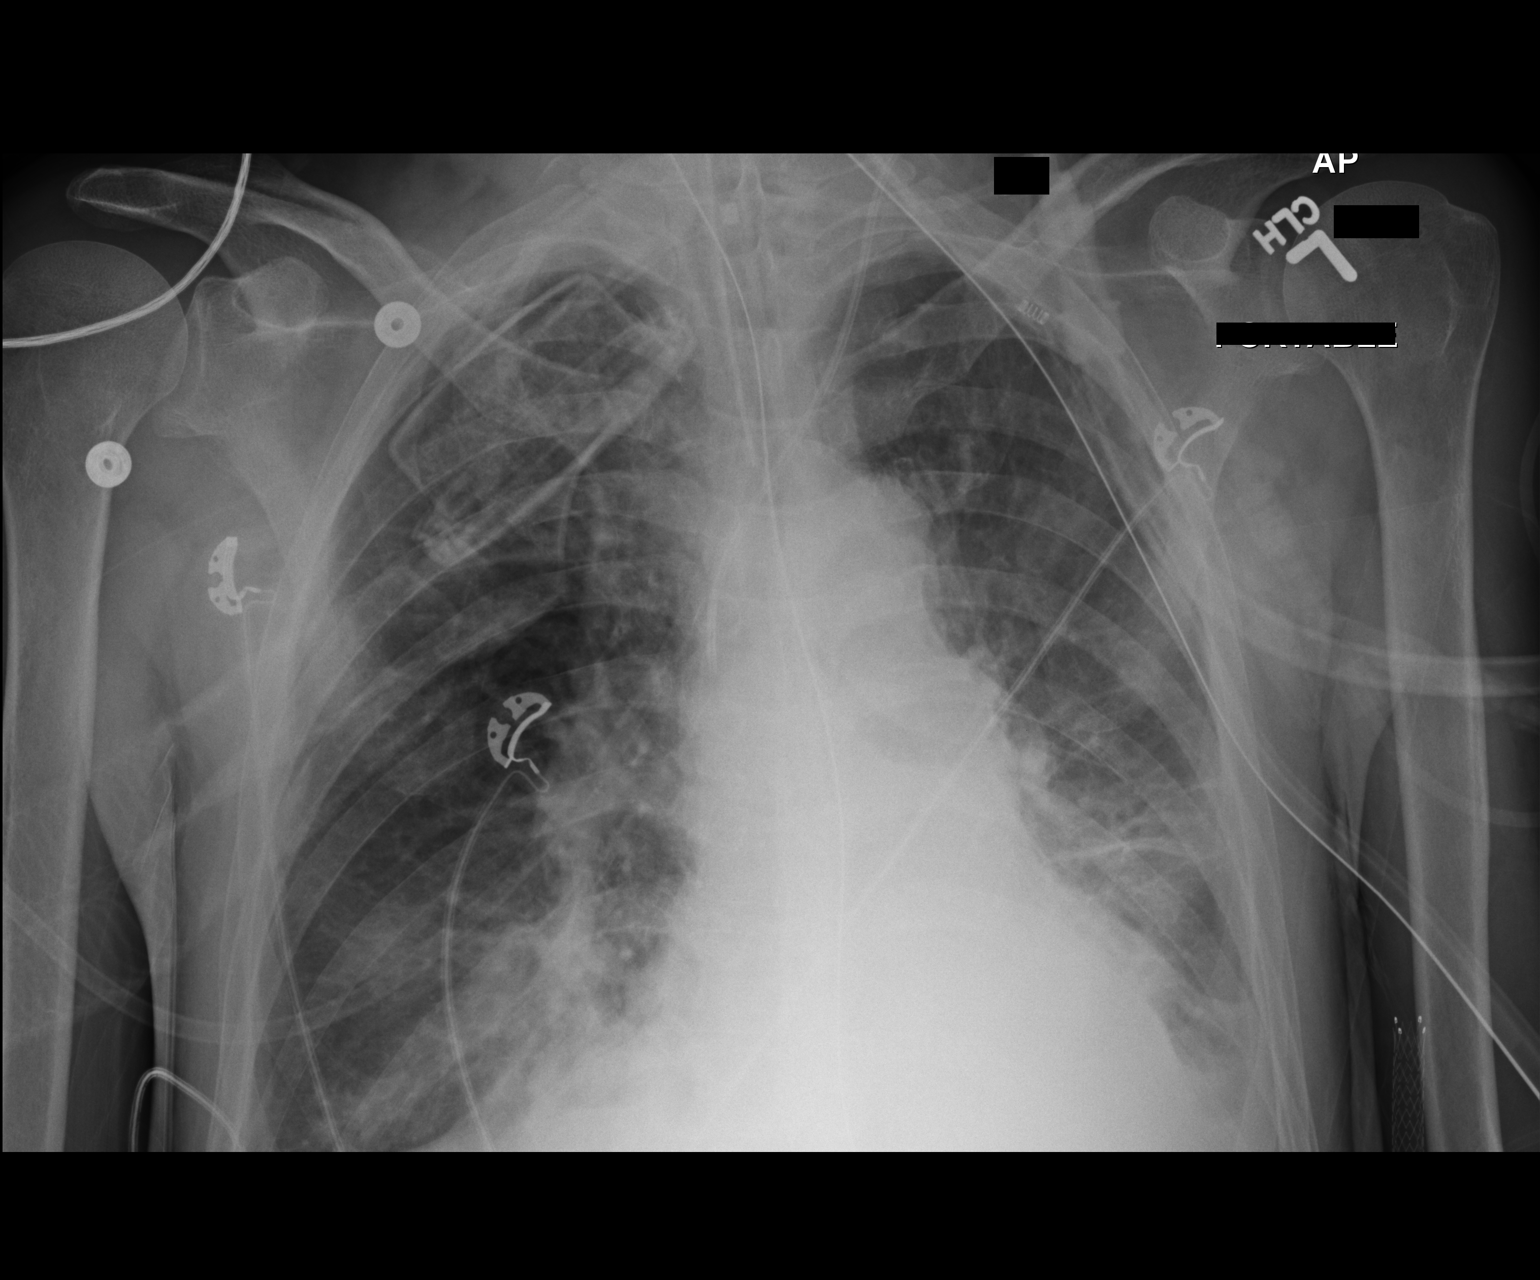

[1 of 1 positions shown; findings below may reference images not displayed]

FINDINGS: The endotracheal tube is in satisfactory position.  Left
jugular catheter tip in the superior vena cava.  Nasogastric tube
extending into the stomach.  Normal sized heart.  Interval aeration
of the left lung with residual dense airspace opacity in the left
lower lobe.  Most pronounced airspace opacity elsewhere in the left
lung and in the right lung.  Probable small left pleural effusion.
Diffuse osteopenia.
IMPRESSION: 1.  Re-expansion of a large portion of the left lung with
persistent dense left lower lobe atelectasis or pneumonia.
2.  Mild airspace opacity elsewhere in both lungs, possibly
representing pulmonary edema or scattered pneumonia.
3.  Probable small left pleural effusion.

## 2012-08-17 IMAGING — CR DG CHEST 1V PORT
2 series · 2 of 2 positions shown · non-contrast
Comparison: 02/09/2012

CLINICAL DATA: Endotracheal tube positioning

PORTABLE CHEST - 1 VIEW

[AP (1 of 2)]
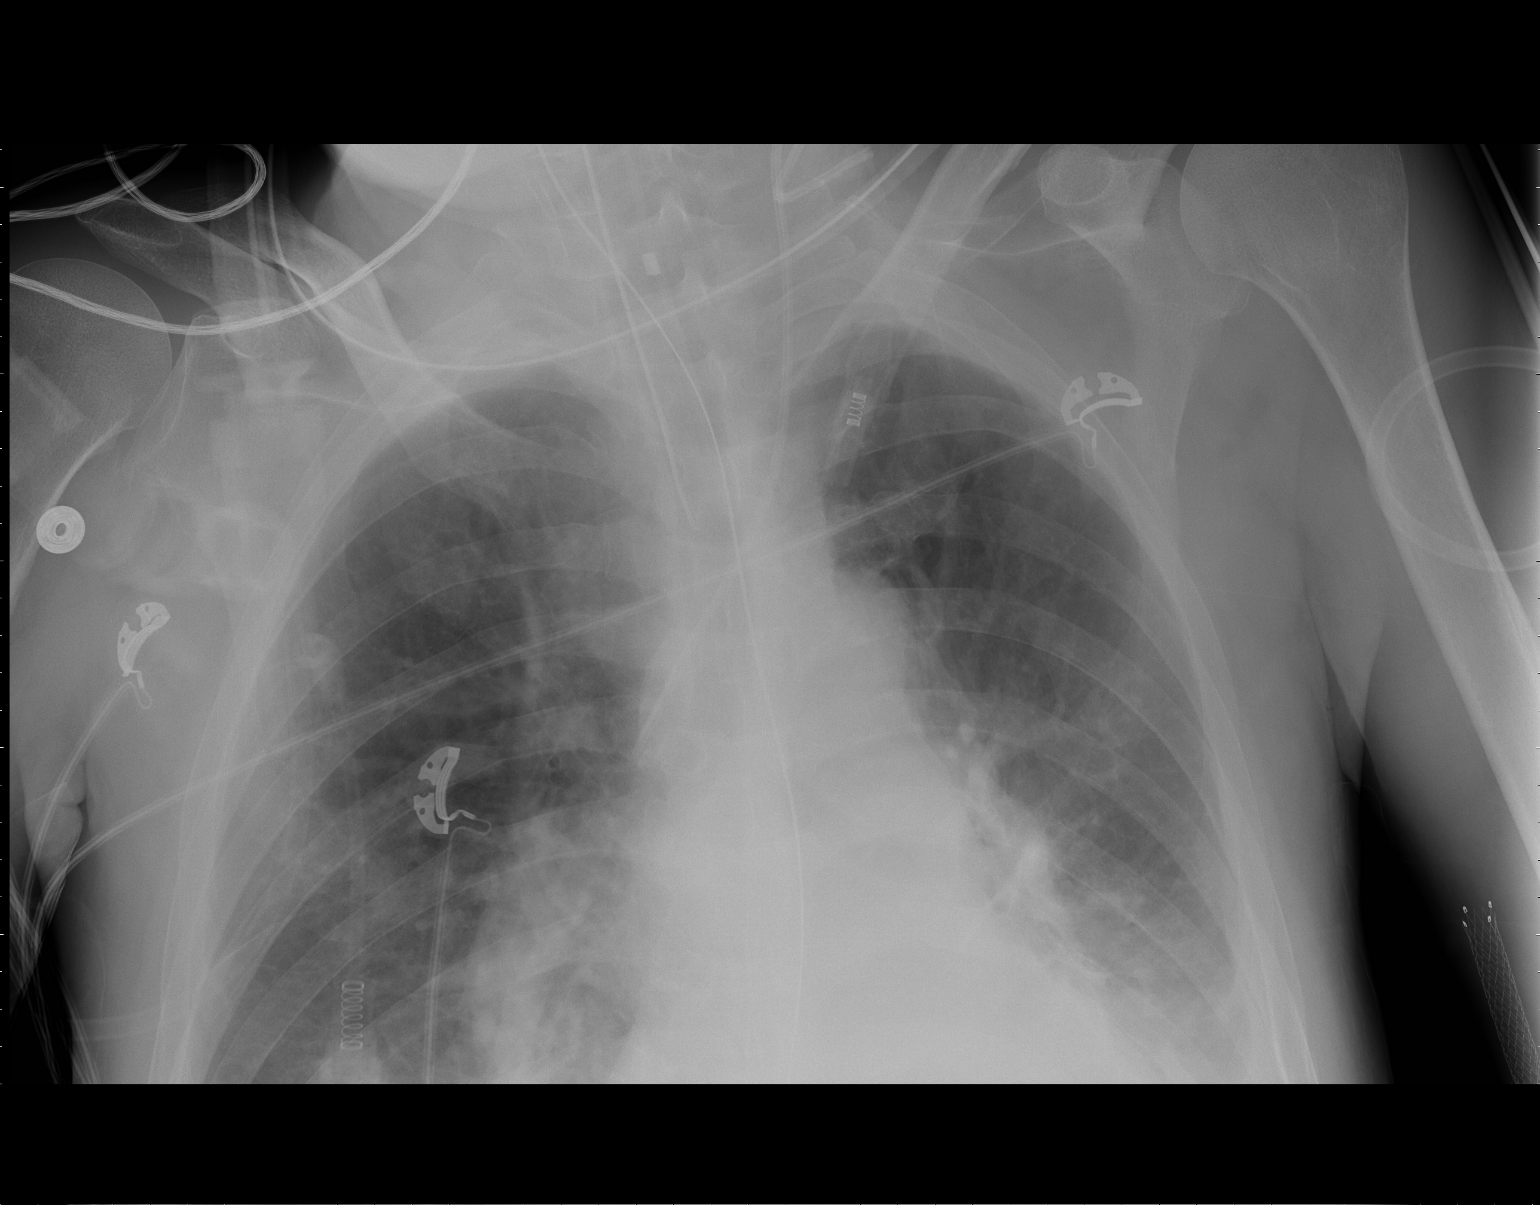

[AP (2 of 2)]
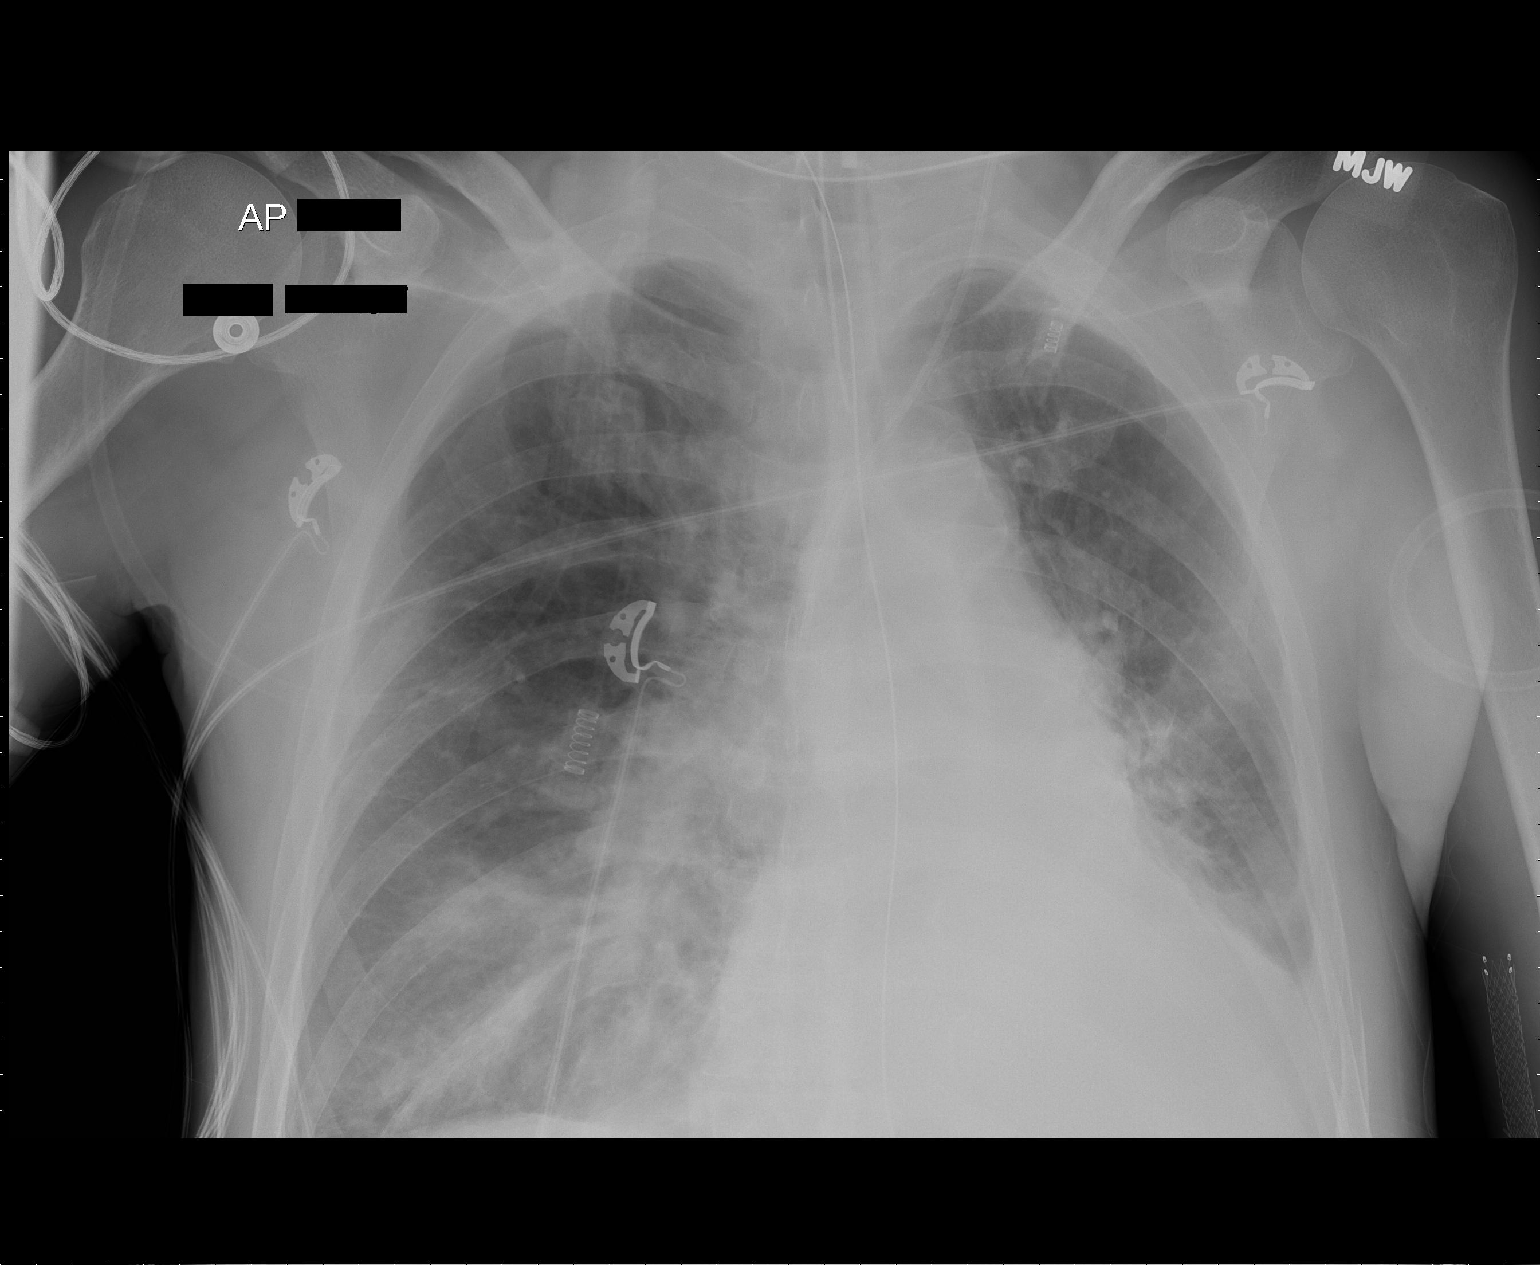

[2 of 2 positions shown; findings below may reference images not displayed]

FINDINGS: Endotracheal tube remains in satisfactory position.  NG
tube enters the stomach with the tip not visualized.  Left jugular
catheter tip in the SVC.

Increase in bilateral airspace disease diffusely.  This may
represent pulmonary edema or pneumonia.  Bibasilar atelectasis has
increased in the interval.  Small pleural effusion on the left.
IMPRESSION: Endotracheal tube remains in satisfactory position.

Increase in diffuse bilateral airspace disease may represent
pulmonary edema.  Increase in bibasilar atelectasis/infiltrate.

## 2012-08-18 IMAGING — CR DG CHEST 1V PORT
1 series · 1 of 1 positions shown · non-contrast
Comparison: 02/10/2012.

CLINICAL DATA: Evaluate endotracheal tube position.  Intubated
patient.

PORTABLE CHEST - 1 VIEW

[AP]
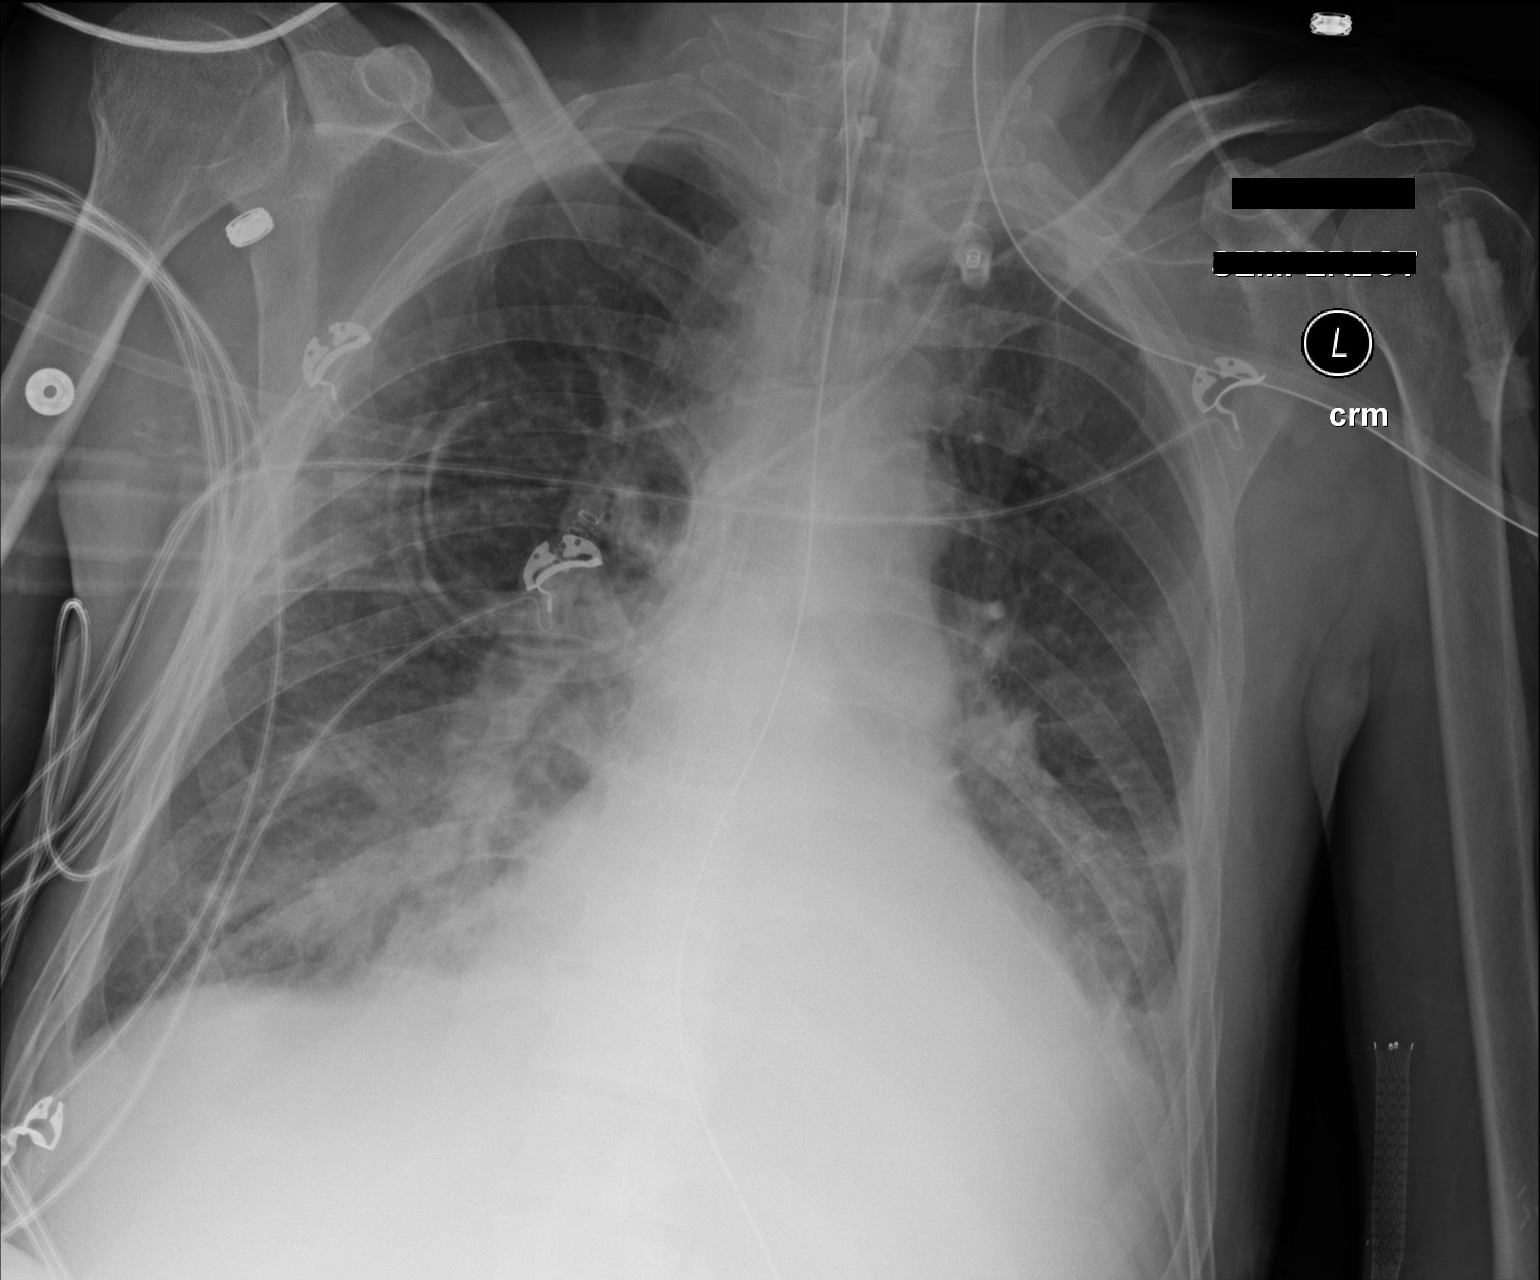

[1 of 1 positions shown; findings below may reference images not displayed]

FINDINGS: Endotracheal tube, enteric tube and left IJ central line
appear similar to prior.  Pulmonary aeration shows no interval
change allowing for differences in technique compared yesterday's
examination.  Cardiomegaly persists.  Dense retrocardiac
opacification likely represents atelectasis, airspace disease and
effusion and combination.
IMPRESSION: No interval change.  Stable support apparatus.

## 2012-08-19 IMAGING — CR DG CHEST 1V PORT
1 series · 1 of 1 positions shown · non-contrast
Comparison: Chest radiograph performed 02/11/2012

CLINICAL DATA: Shortness of breath.

PORTABLE CHEST - 1 VIEW

[AP]
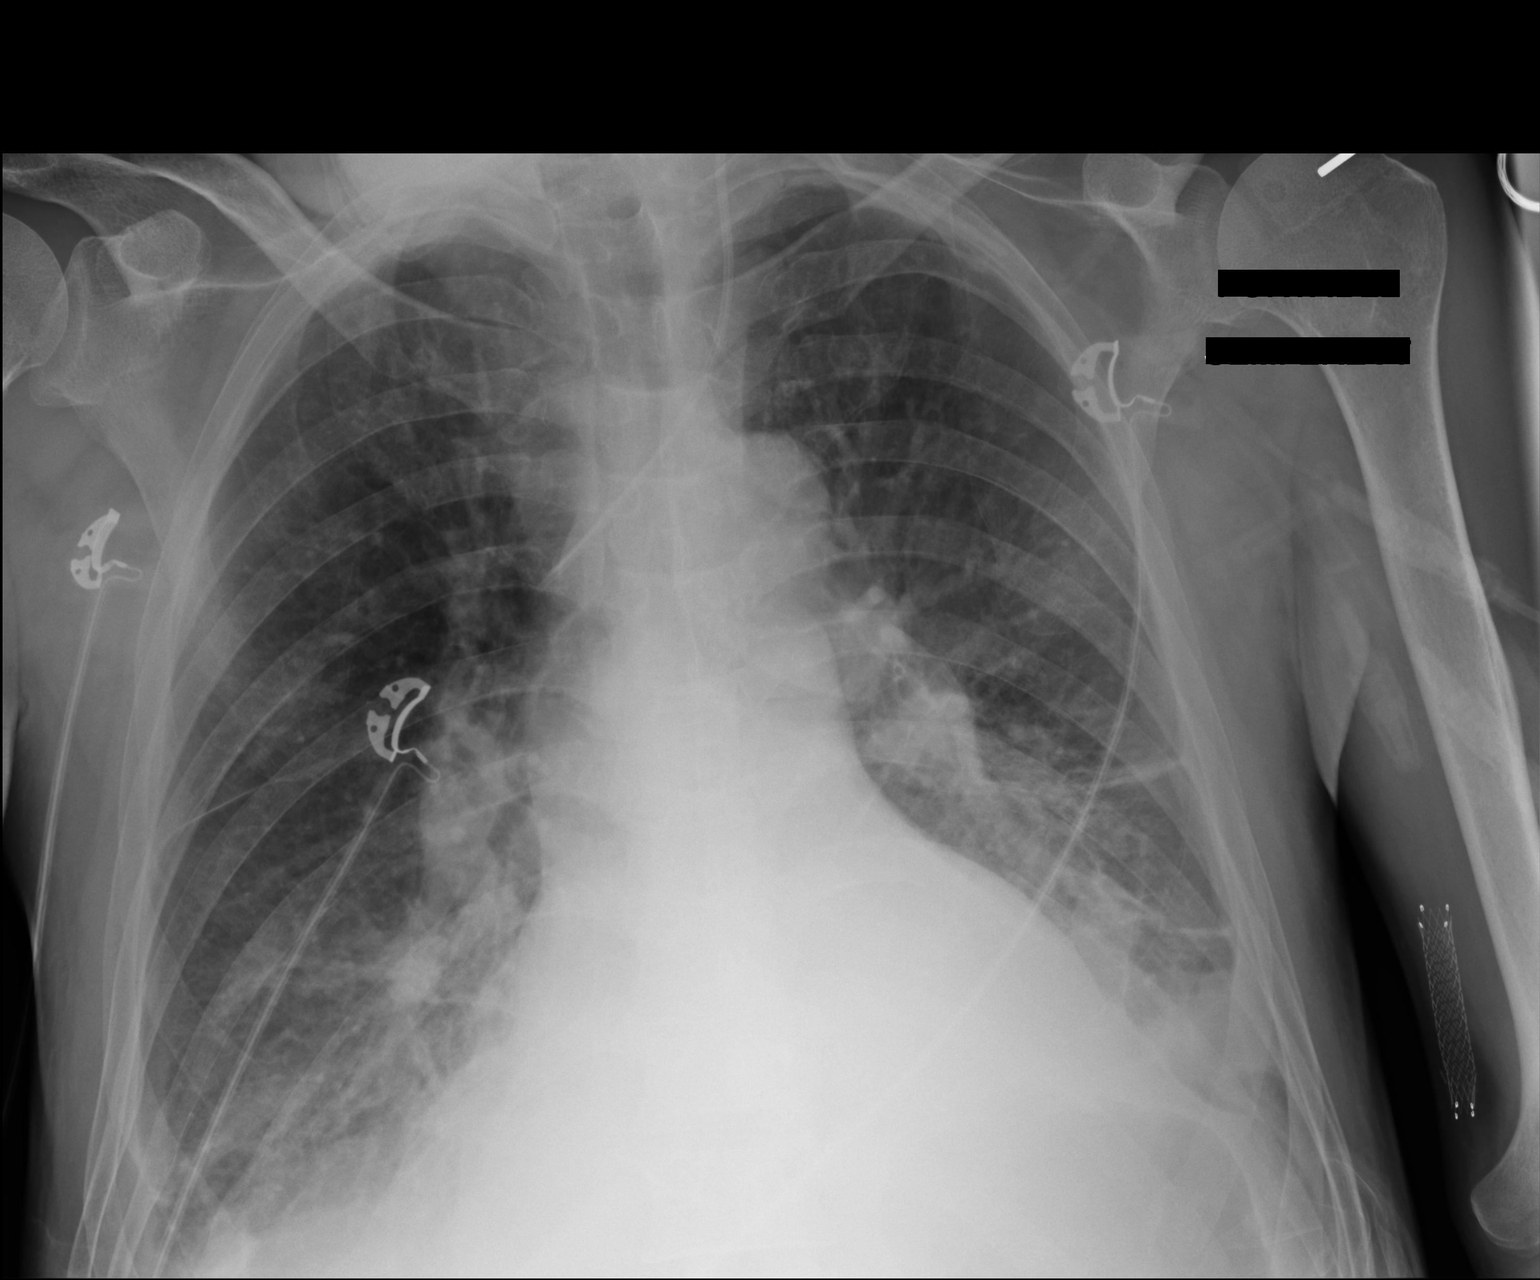

[1 of 1 positions shown; findings below may reference images not displayed]

FINDINGS: The patient's endotracheal tube has been removed.  A left
IJ line is noted ending about the junction of the SVC and left
brachiocephalic vein.

The lungs are relatively well expanded.  Bibasilar airspace
opacities may reflect atelectasis or possibly pneumonia.  Pulmonary
edema could have a similar appearance, given underlying vascular
congestion.  Small bilateral pleural effusions are likely present.
No pneumothorax is identified.

The cardiomediastinal silhouette is mildly enlarged.  No acute
osseous abnormalities are identified.
IMPRESSION: Bibasilar airspace opacities may reflect atelectasis or pneumonia.
Pulmonary edema could have a similar appearance, given underlying
vascular congestion and mild cardiomegaly.  Small bilateral pleural
effusions likely present.

## 2012-08-20 ENCOUNTER — Encounter: Payer: Self-pay | Admitting: Vascular Surgery

## 2012-08-20 IMAGING — CR DG CHEST 1V PORT
1 series · 1 of 1 positions shown · non-contrast
Comparison: 02/12/2012.

CLINICAL DATA: Atelectasis.

PORTABLE CHEST - 1 VIEW

[AP]
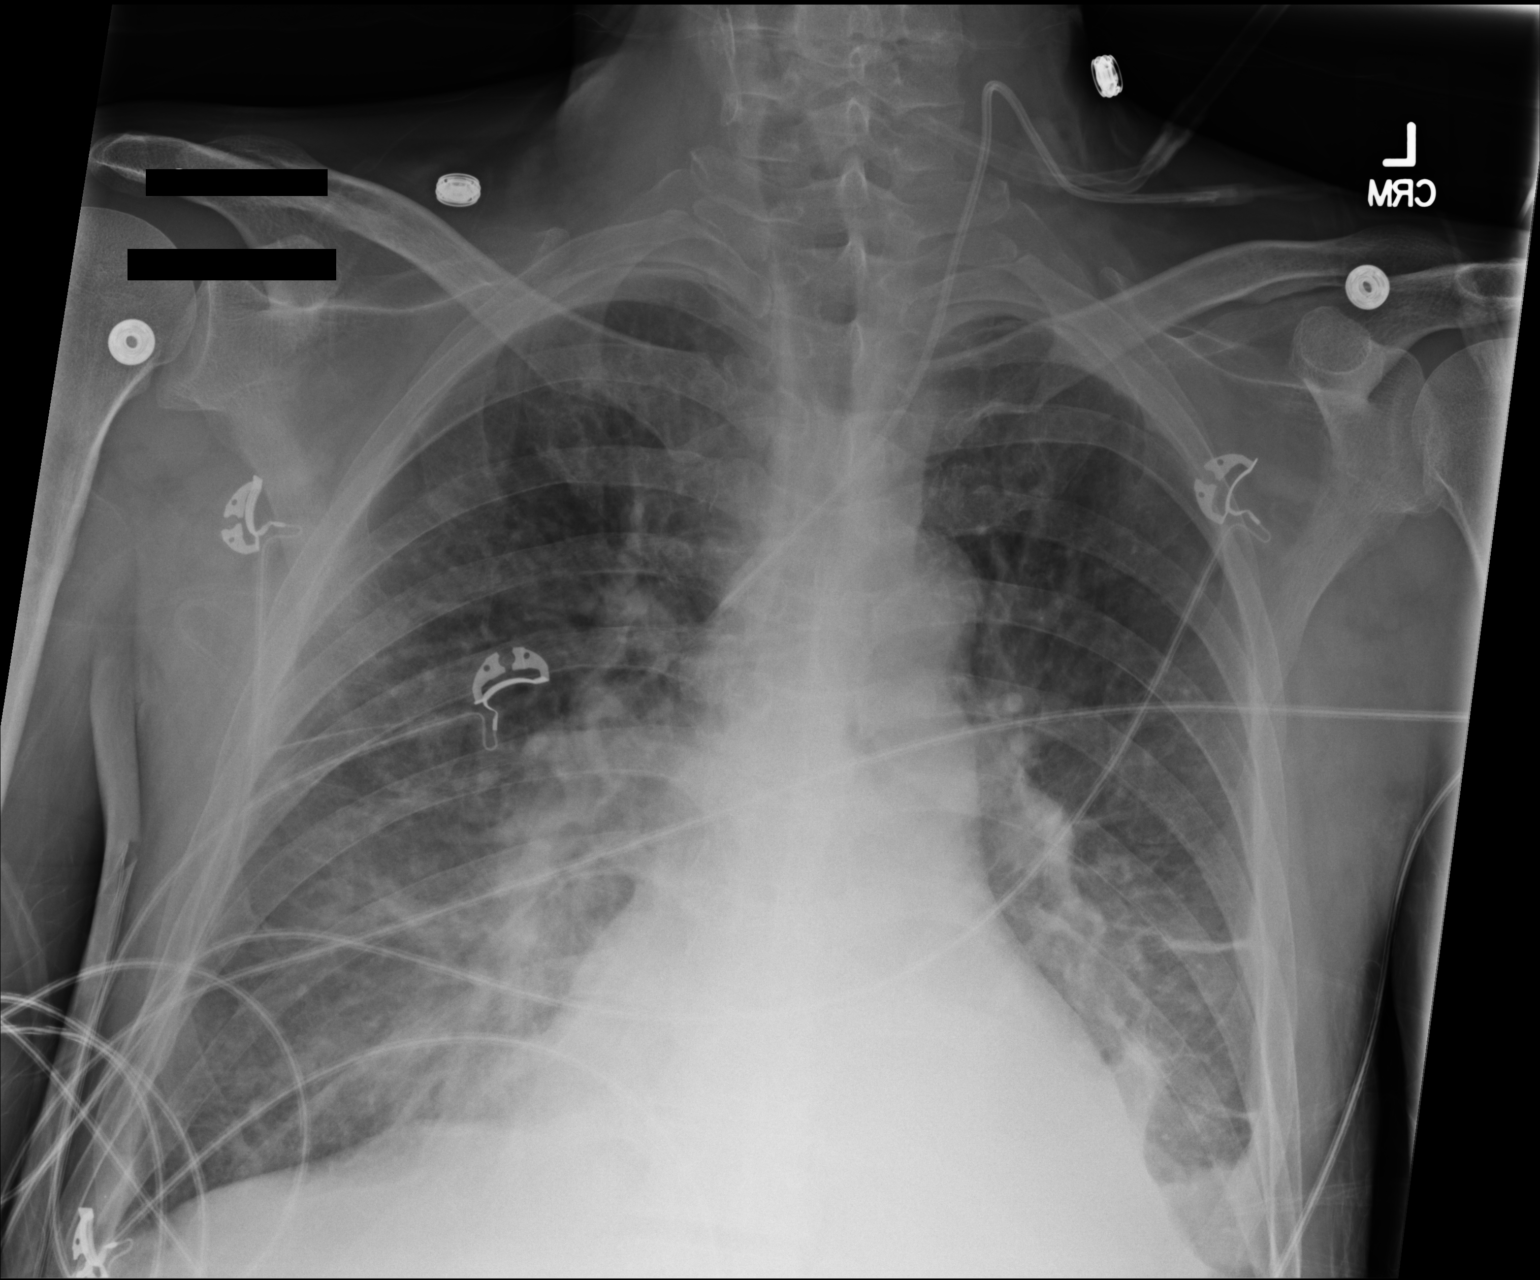

[1 of 1 positions shown; findings below may reference images not displayed]

FINDINGS: Left IJ central line is unchanged.  Left costophrenic
angle cut off.  Cardiopericardial silhouette appears unchanged.
Basilar atelectasis and airspace disease.  Subsegmental atelectasis
and scarring at the left base.
IMPRESSION: Stable support apparatus and no interval change in the heart and
lungs.

## 2012-08-21 ENCOUNTER — Ambulatory Visit: Payer: Medicare Other | Admitting: Vascular Surgery

## 2012-08-21 IMAGING — CR DG CHEST 1V PORT
1 series · 1 of 1 positions shown · non-contrast
Comparison: Chest radiograph 02/13/2012

CLINICAL DATA: Shortness of breath

PORTABLE CHEST - 1 VIEW

[AP]
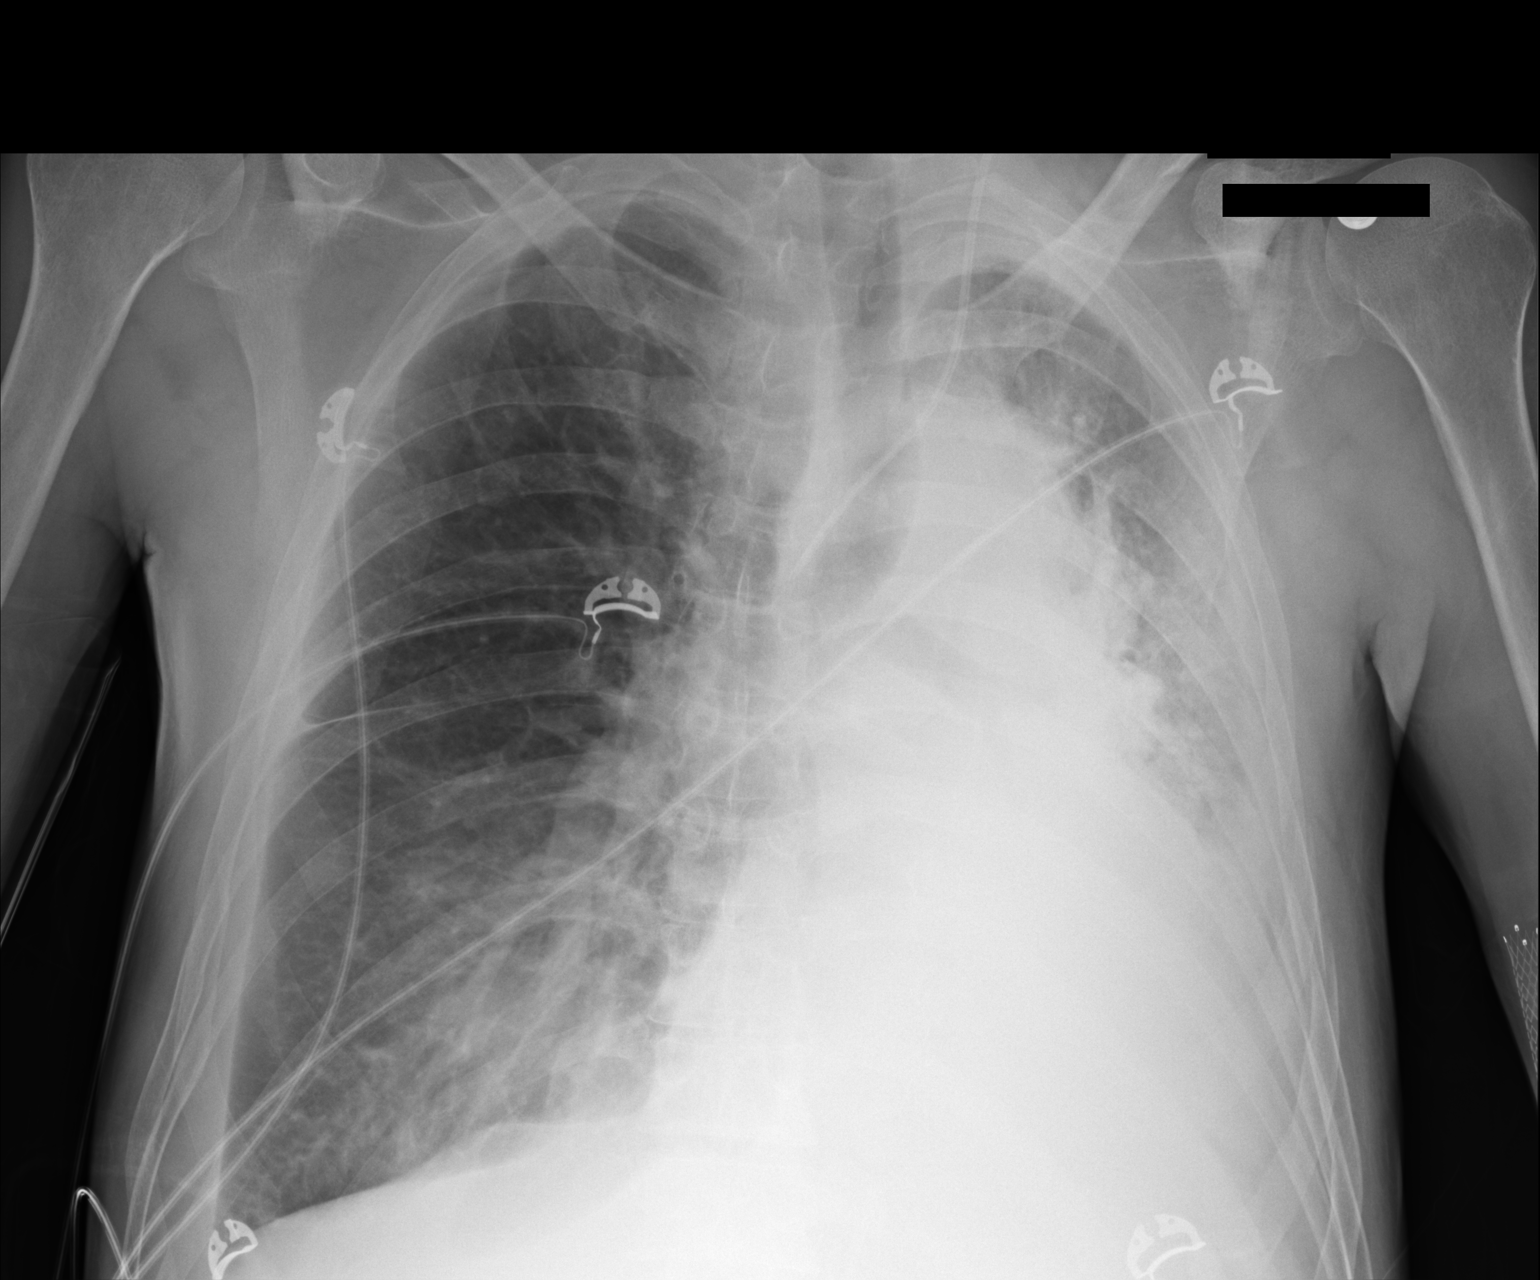

[1 of 1 positions shown; findings below may reference images not displayed]

FINDINGS: Left central venous line is unchanged.  Stable enlarged
heart silhouette.  There is increase in left lower lobe atelectasis
compared to prior.  Perihilar air space disease also appears
increased.  No pneumothorax.  Bilateral effusions noted.
IMPRESSION: Interval increase in left lower lobe atelectasis, central venous
congestion suggesting pulmonary edema, and bilateral pleural
effusions.

## 2012-08-21 IMAGING — CR DG CHEST 1V PORT
1 series · 1 of 1 positions shown · non-contrast
Comparison: 02/14/2012.

CLINICAL DATA: Confirm endotracheal tube placement.

PORTABLE CHEST - 1 VIEW

[AP]
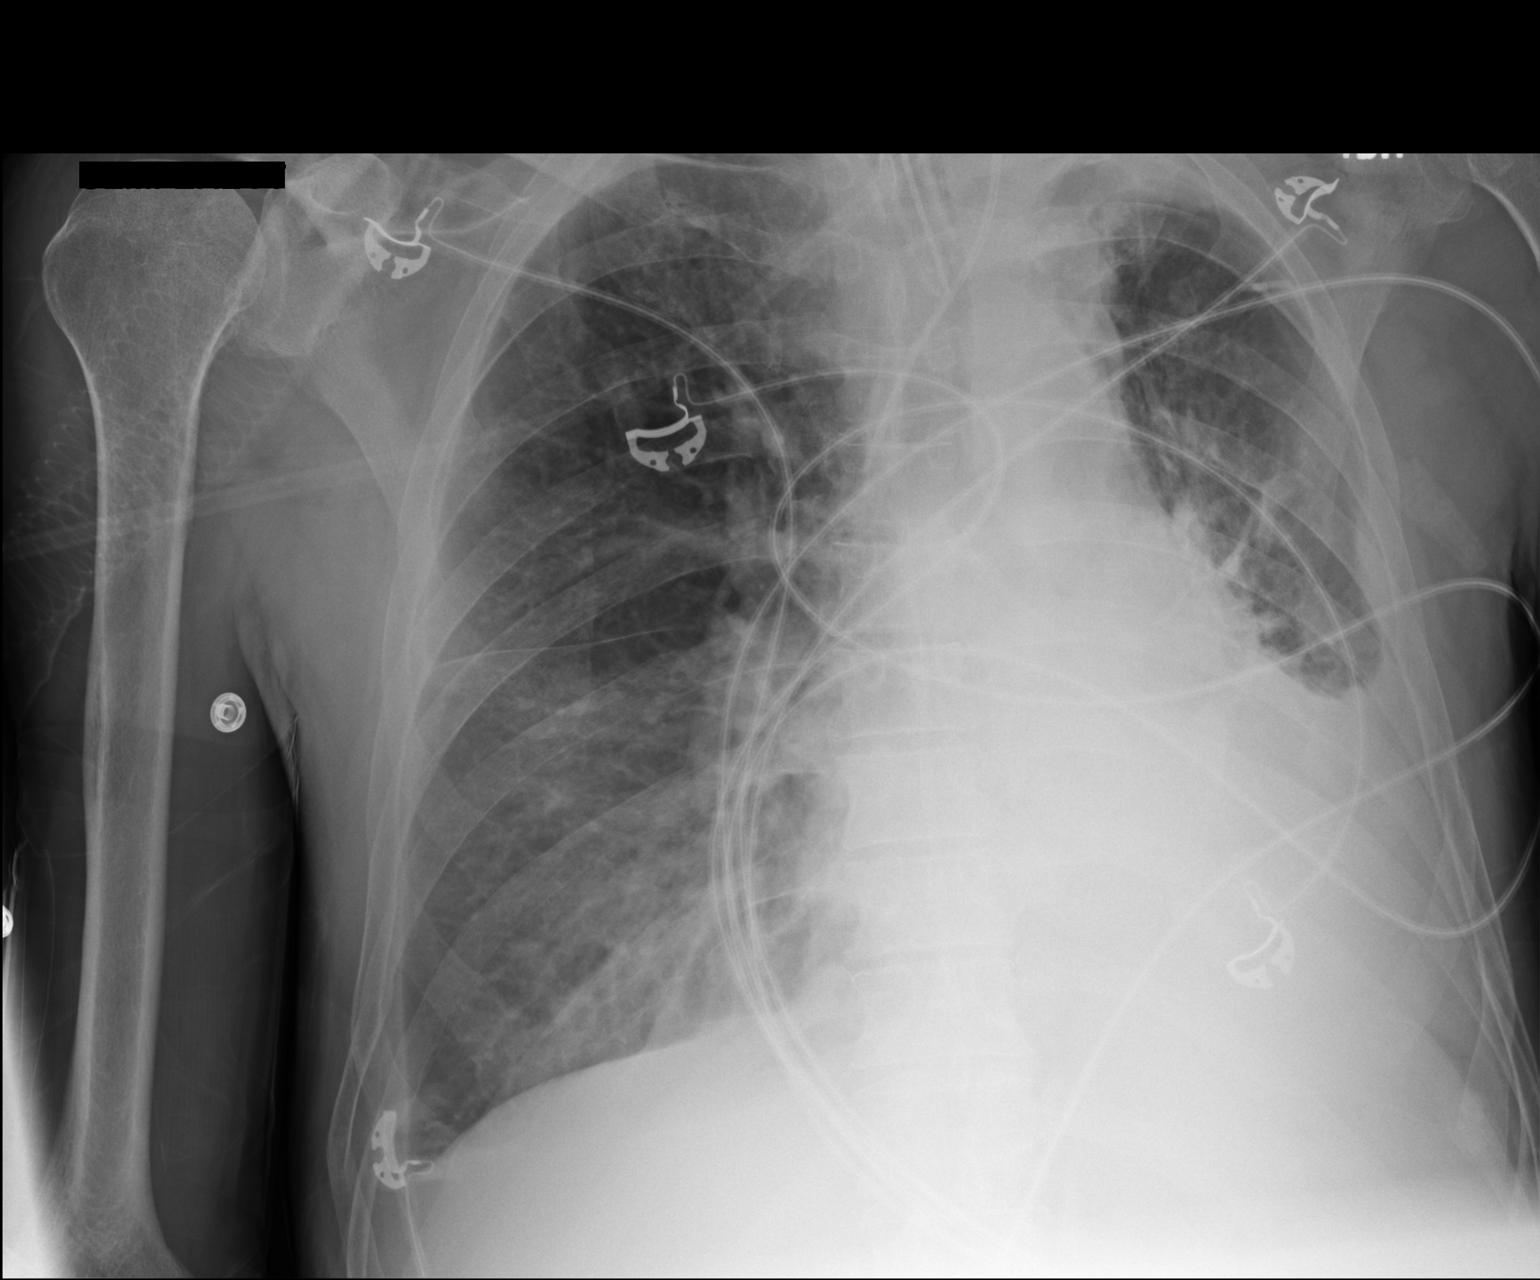

[1 of 1 positions shown; findings below may reference images not displayed]

FINDINGS: Endotracheal tube is in satisfactory position.  Left IJ
central line tip projects over the SVC.  Heart size is grossly
stable.  There is dense consolidation in the left lower lobe, with
a left pleural effusion.  Added density is seen in the left
paratracheal region and apex of the left hemithorax.  Interstitial
prominence in the right lung.  Small right pleural effusion.
IMPRESSION: 1.  Dense consolidation in the left lower lobe, with left
paratracheal soft tissue density and left apical pleural
thickening.  While findings may be due to pneumonia, an underlying
mass and/or adenopathy could also have this appearance. CT chest
with contrast may be helpful in further evaluation, as clinically
indicated.
2.  Moderate left pleural effusion.
3.  Possible mild edema in the right lung with a small right
pleural effusion.

## 2012-08-22 IMAGING — CR DG CHEST 1V PORT
1 series · 1 of 1 positions shown · non-contrast
Comparison: 02/14/2012

CLINICAL DATA: Evaluate endotracheal tube position.

PORTABLE CHEST - 1 VIEW

[AP]
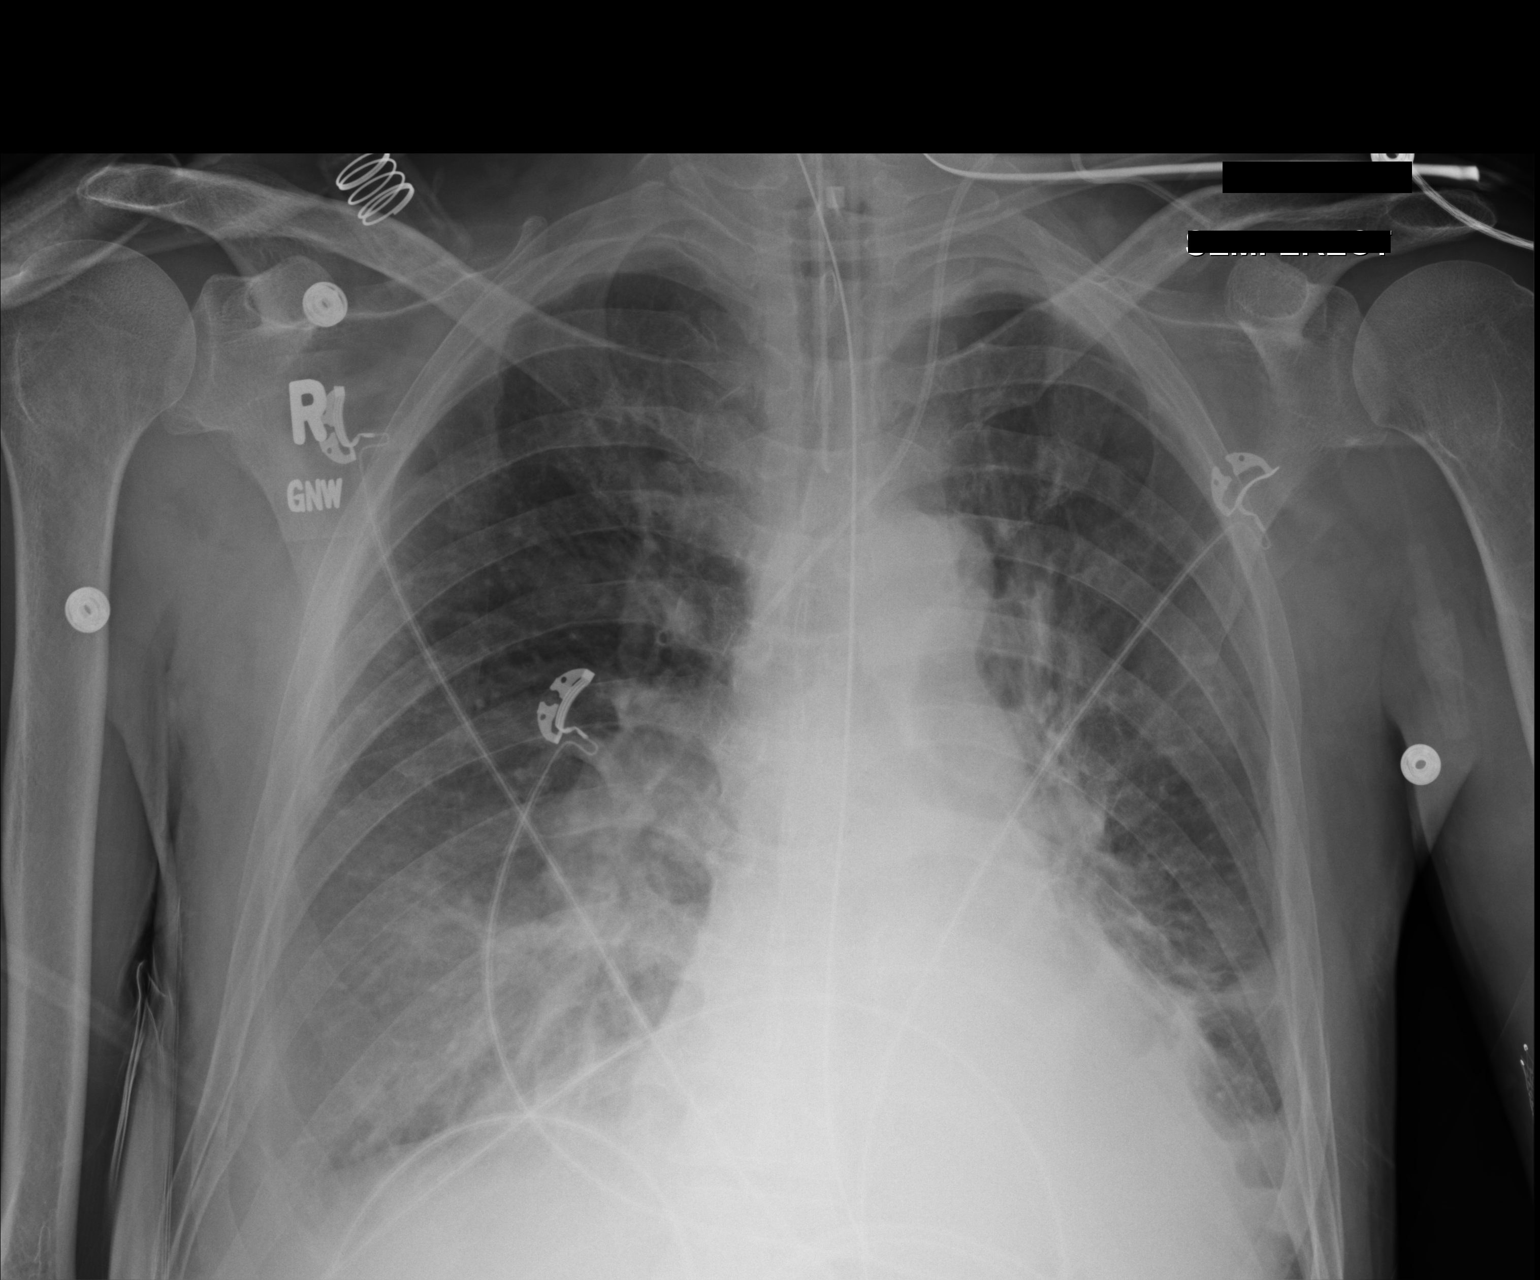

[1 of 1 positions shown; findings below may reference images not displayed]

FINDINGS: Endotracheal tube is 5.4 cm above the carina.  Left
jugular central line in the upper SVC region.  Persistent basilar
densities, left side greater than right.  Upper lungs remain clear.
Nasogastric tube extends into the abdomen.
IMPRESSION: Persistent basilar densities suggestive for dependent edema and
atelectasis.  Cannot exclude pleural fluid.  Overall, there is
slightly improved aeration in the left lower lung.

Support apparatuses as described.

## 2012-08-23 IMAGING — CR DG CHEST 1V PORT
1 series · 1 of 1 positions shown · non-contrast
Comparison: 02/15/2012

CLINICAL DATA: End-stage renal disease on dialysis. Pulmonary
edema. Ventilator dependent respiratory failure.

PORTABLE CHEST - 1 VIEW

[AP]
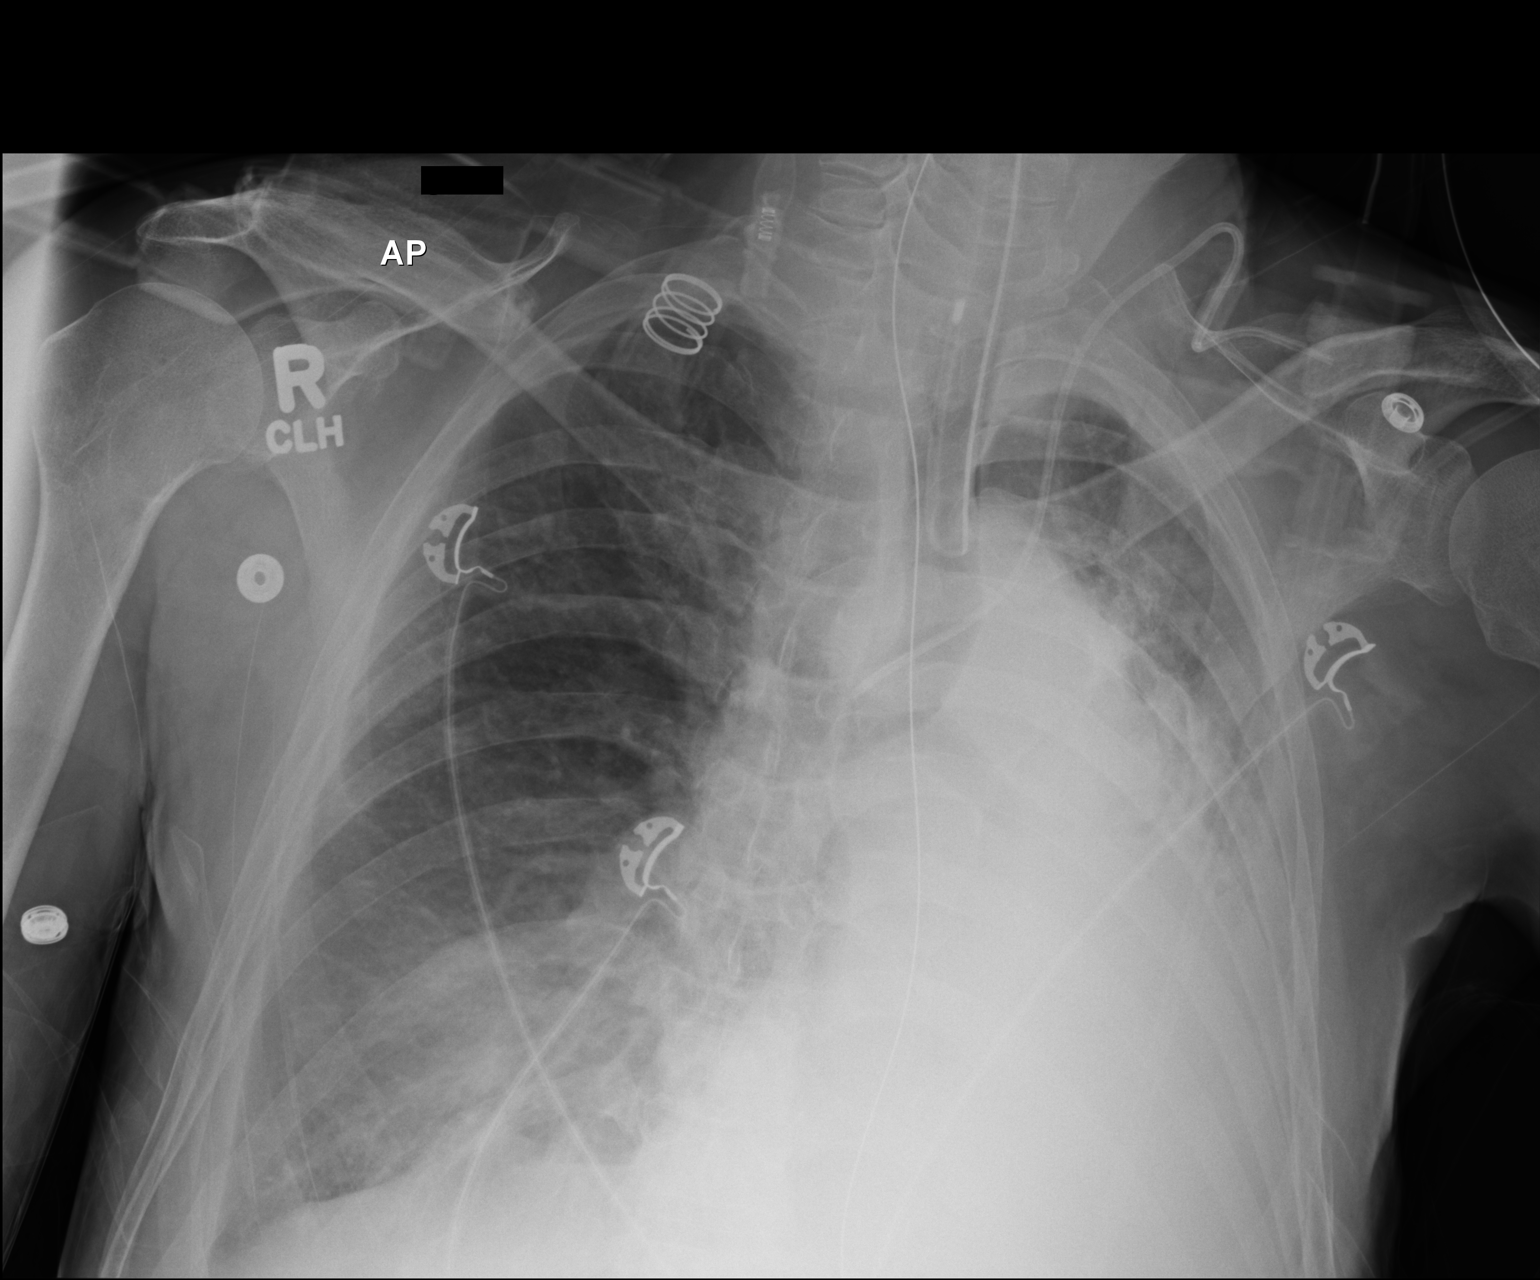

[1 of 1 positions shown; findings below may reference images not displayed]

FINDINGS: Cardiomegaly and diffuse pulmonary edema are again
demonstrated.  Small right pleural effusion is stable.

There is worsening left lung volume loss with opacification of the
left lower lung consistent with left lower lobe collapse.  New
mediastinal shift to the left is noted.  Support lines and tubes
remain in stable position.
IMPRESSION: 1.  New left lower lobe collapse, with mediastinal shift to the
left, possibly due to mucous plugging.
2.  Stable cardiomegaly, diffuse pulmonary edema, and small right
pleural effusion.

## 2012-08-24 IMAGING — CR DG CHEST 1V PORT
2 series · 2 of 2 positions shown · non-contrast
Comparison: 02/16/2012

CLINICAL DATA: Shortness of breath.

PORTABLE CHEST - 1 VIEW

[AP (1 of 2)]
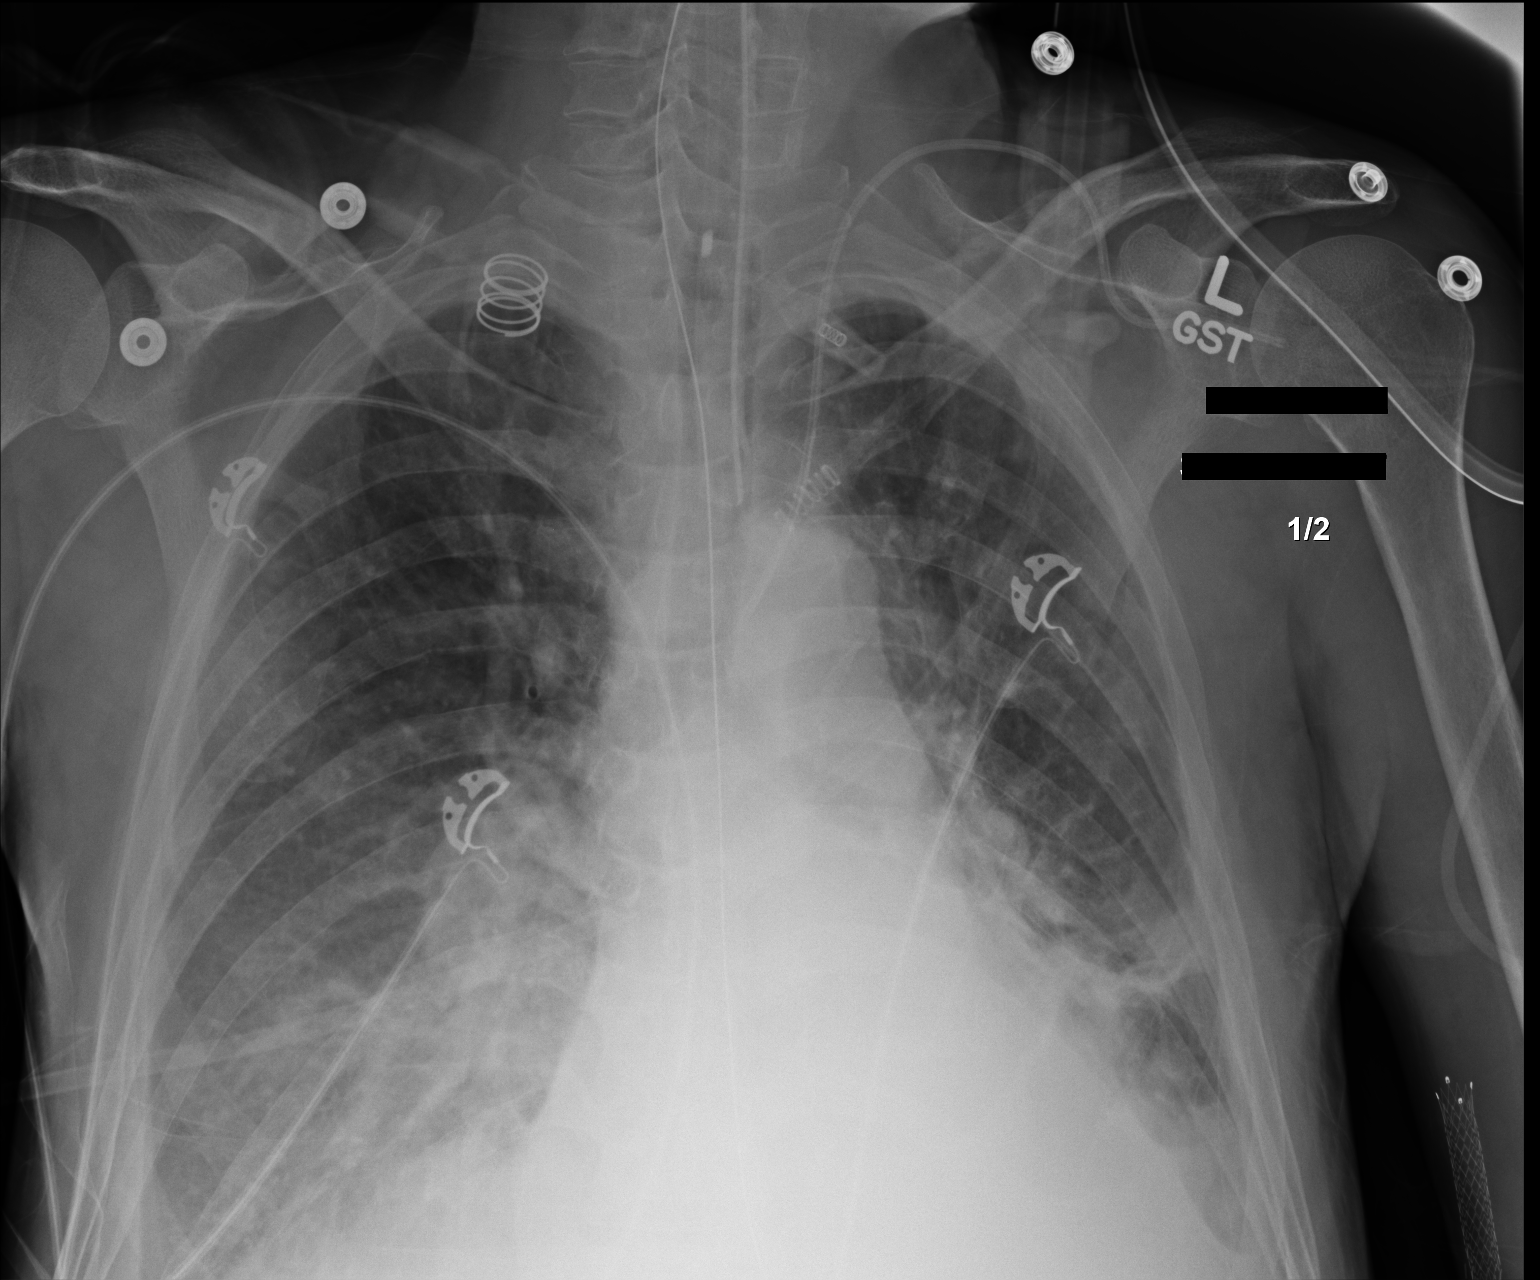

[AP (2 of 2)]
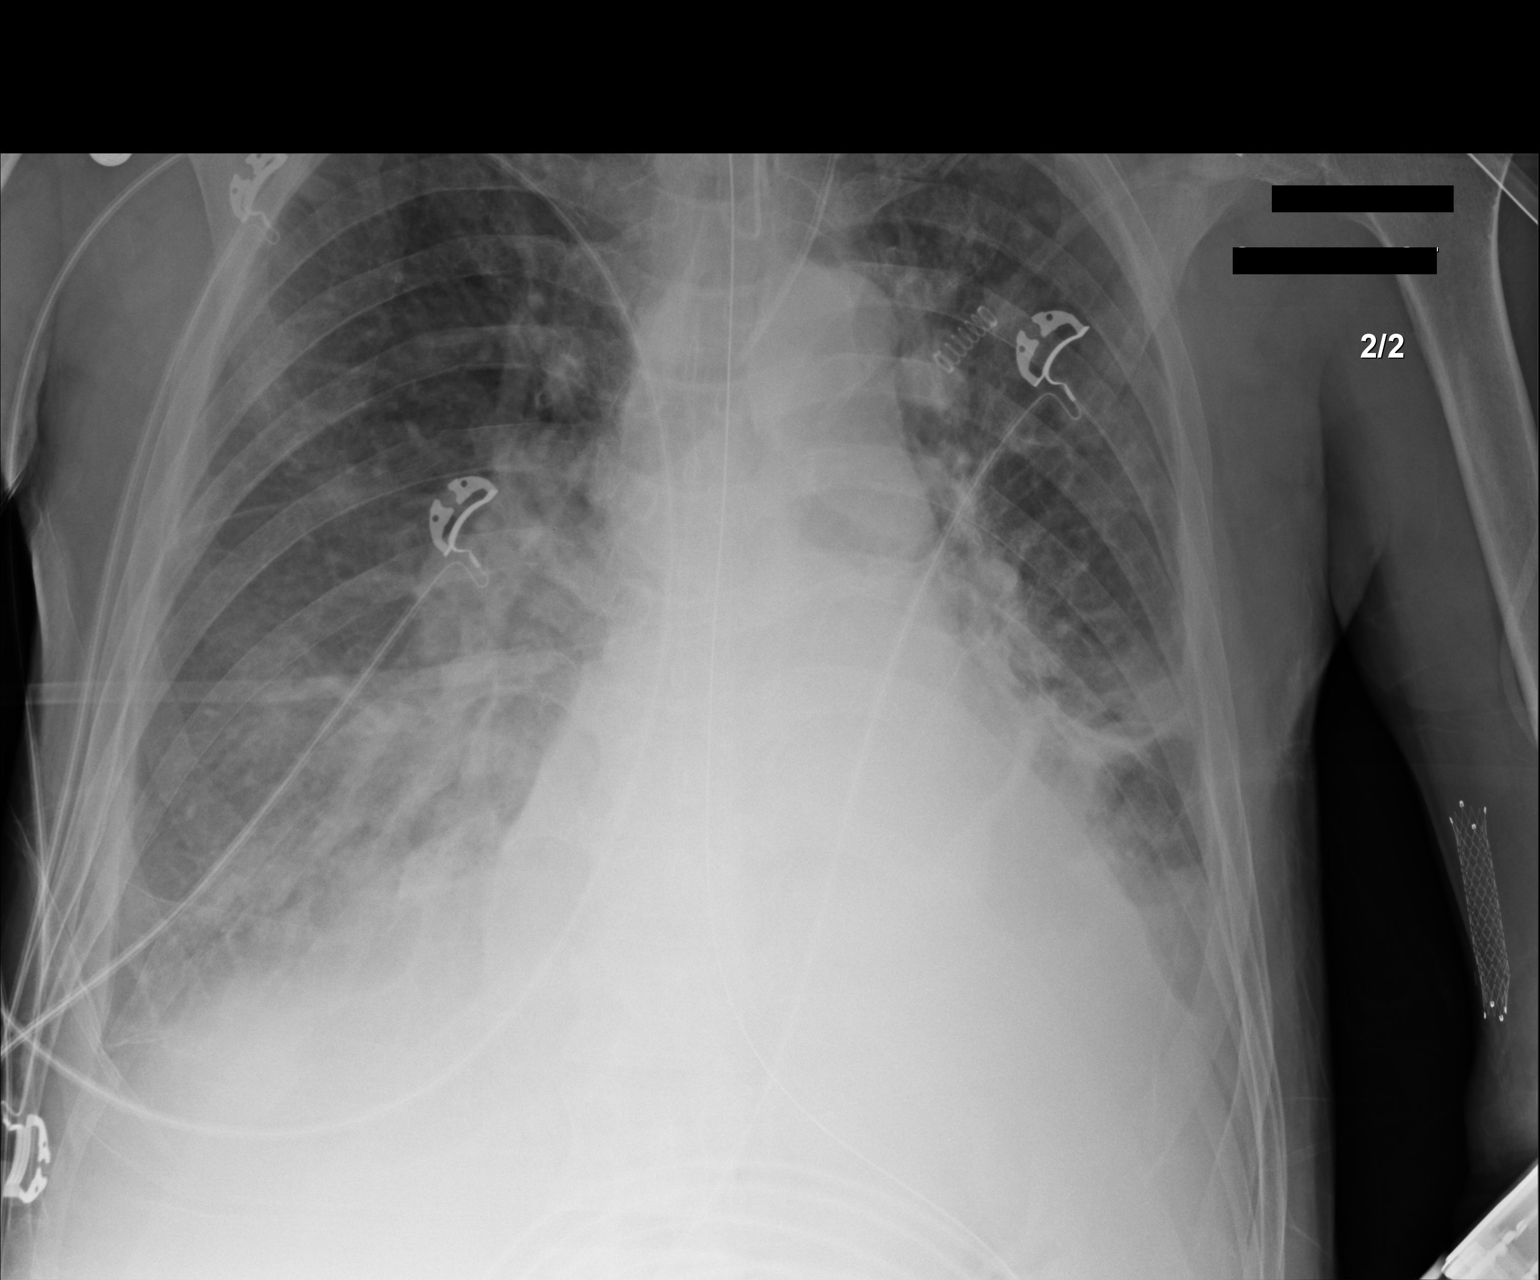

[2 of 2 positions shown; findings below may reference images not displayed]

FINDINGS: Support devices are unchanged.  Bilateral perihilar and
lower lobe opacities, improved on the left since prior study.
Small bilateral effusions and visually.
IMPRESSION: Bilateral perihilar and lower lobe opacities, possibly edema,
improved since prior study.  Small bilateral effusions.

## 2012-08-25 IMAGING — CR DG CHEST 1V PORT
1 series · 1 of 1 positions shown · non-contrast
Comparison: 02/17/2012

CLINICAL DATA: Assess ET tube.

PORTABLE CHEST - 1 VIEW

[AP]
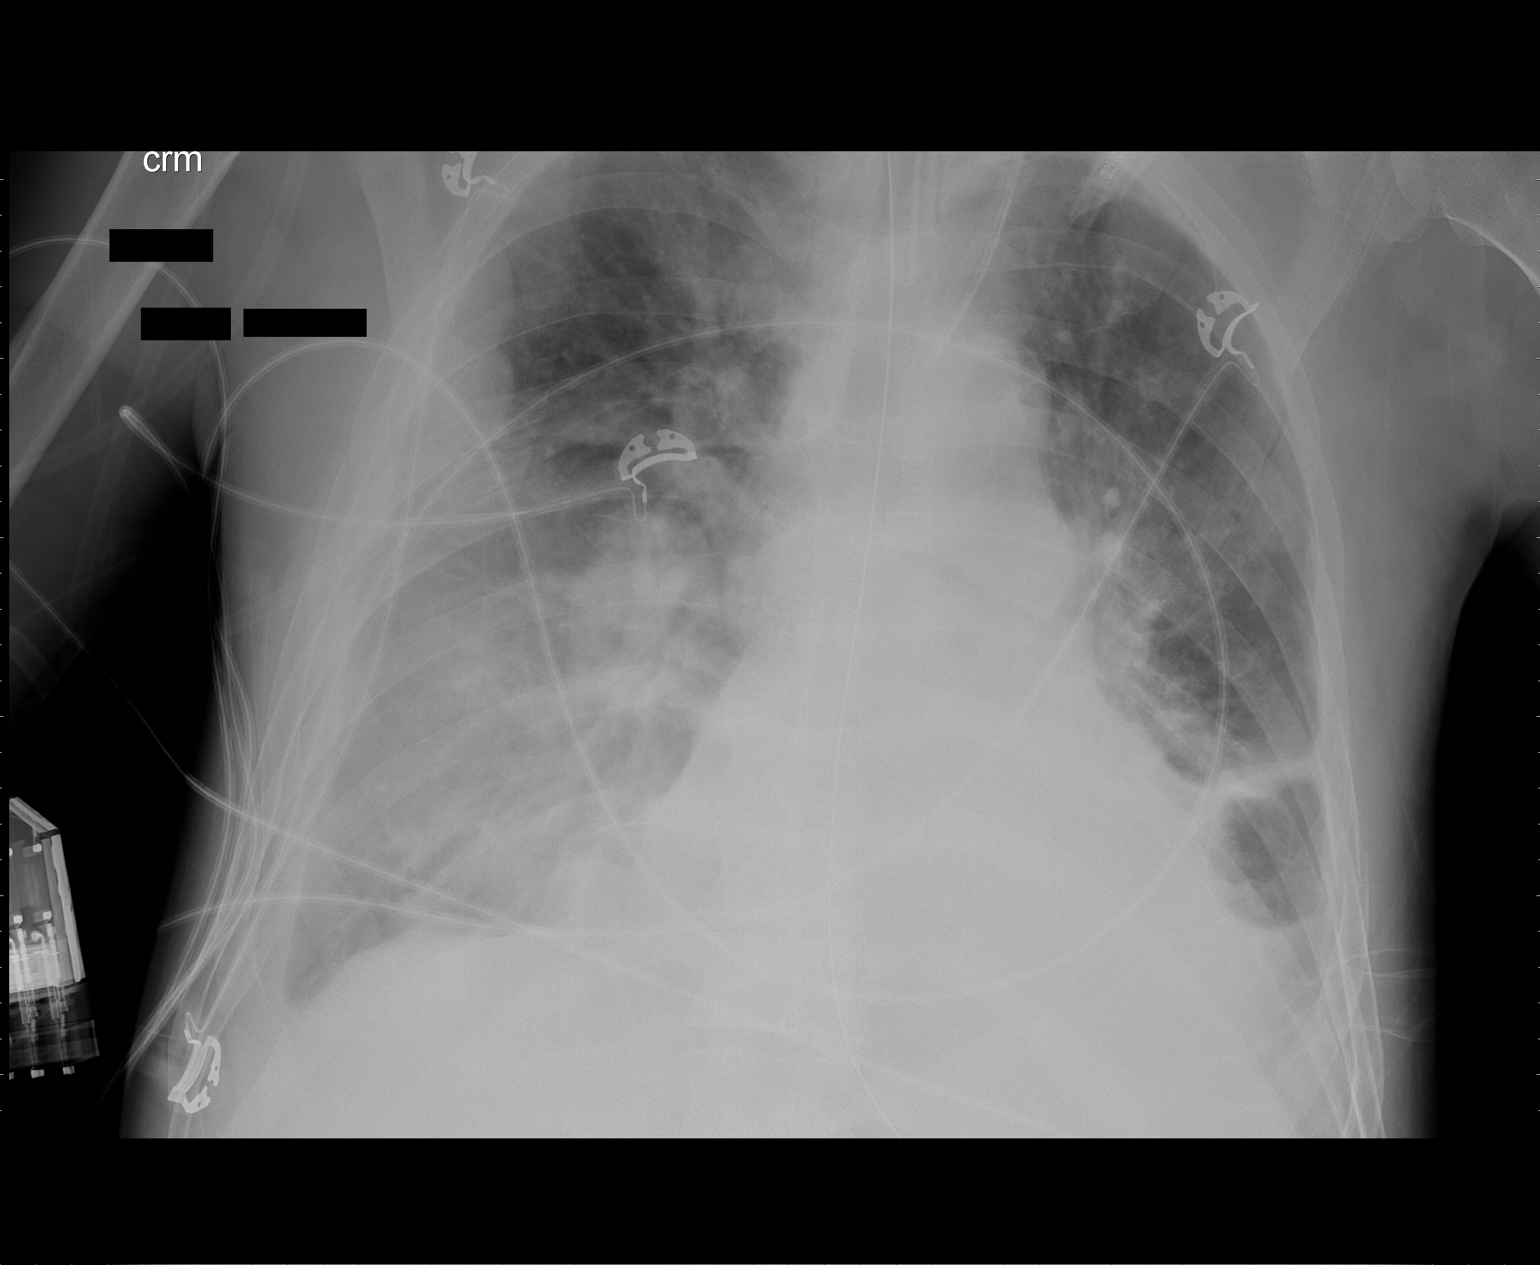

[1 of 1 positions shown; findings below may reference images not displayed]

FINDINGS: Support devices are unchanged.  Patchy bilateral airspace
disease again noted, not significantly changed.  Mild cardiomegaly
and small effusions are stable.
IMPRESSION: Stable bilateral airspace disease and effusions.

## 2012-08-26 IMAGING — CR DG CHEST 1V PORT
2 series · 2 of 2 positions shown · non-contrast
Comparison: [DATE] and 02/17/2012

CLINICAL DATA: Bilateral pulmonary infiltrates and effusions.

PORTABLE CHEST - 1 VIEW

[AP (1 of 2)]
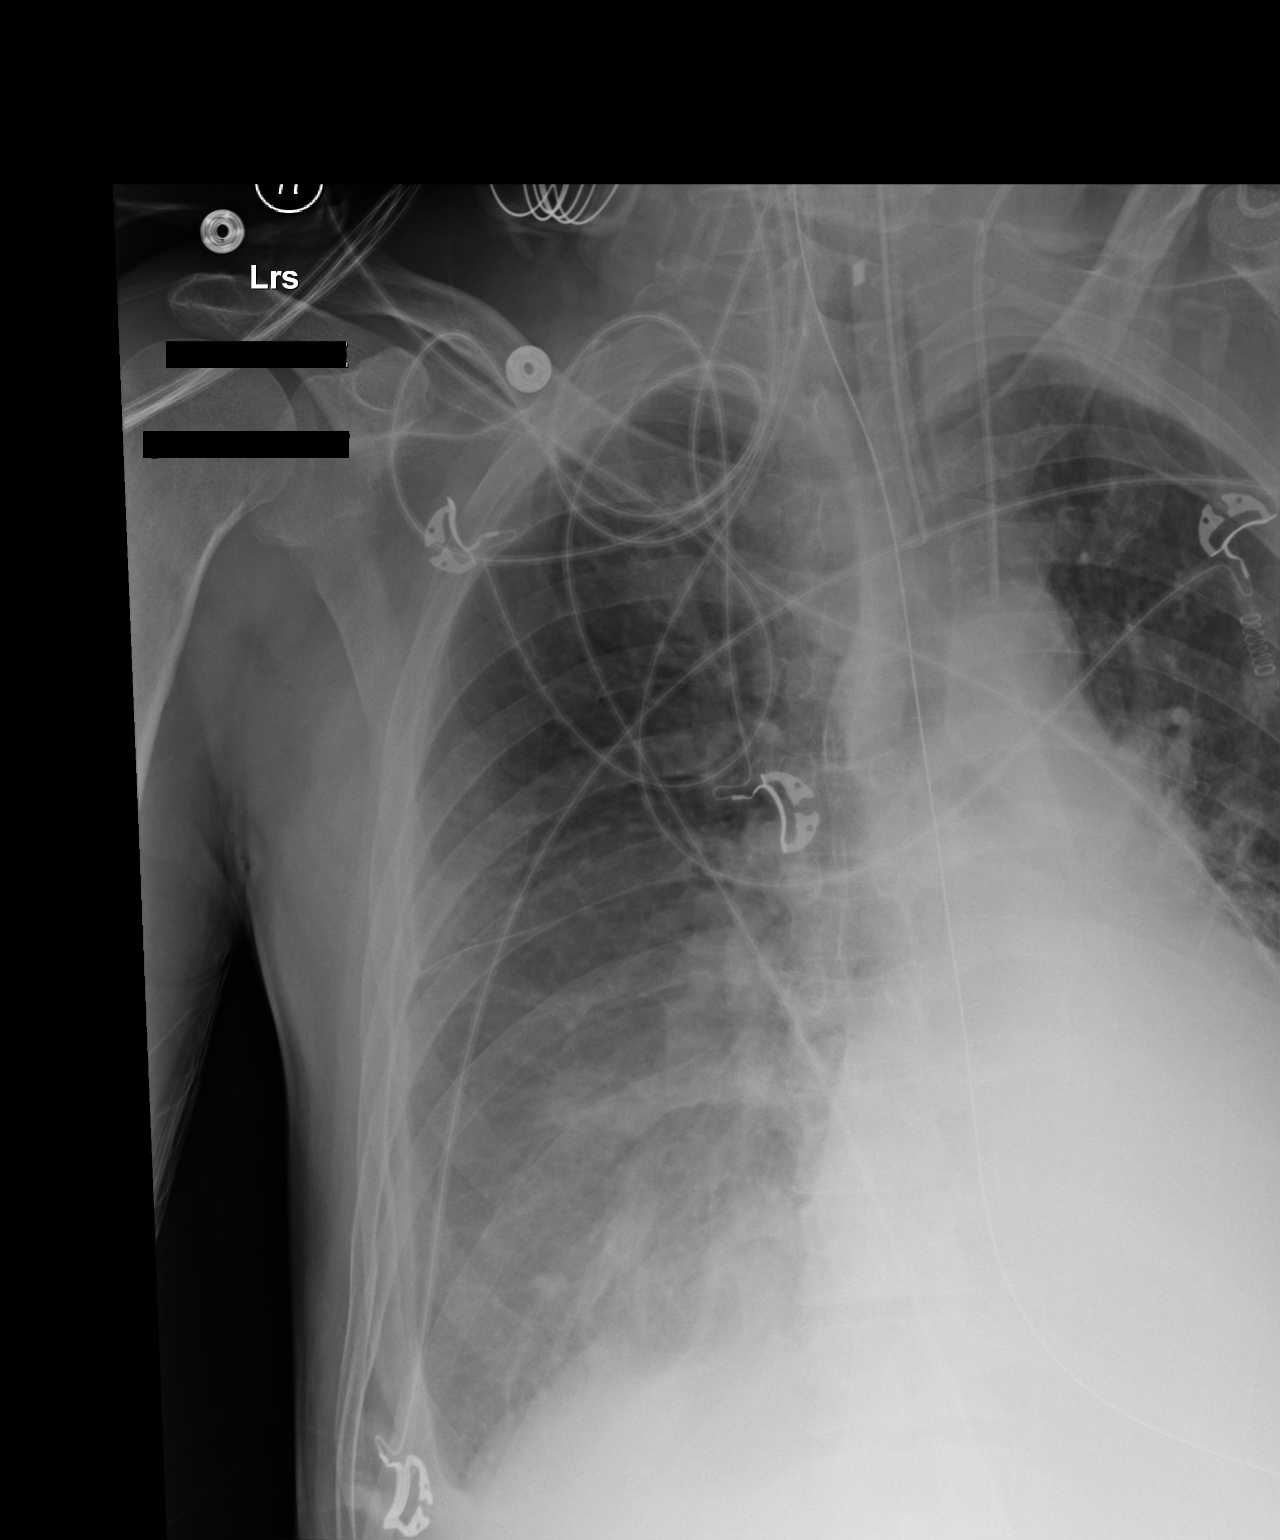

[AP (2 of 2)]
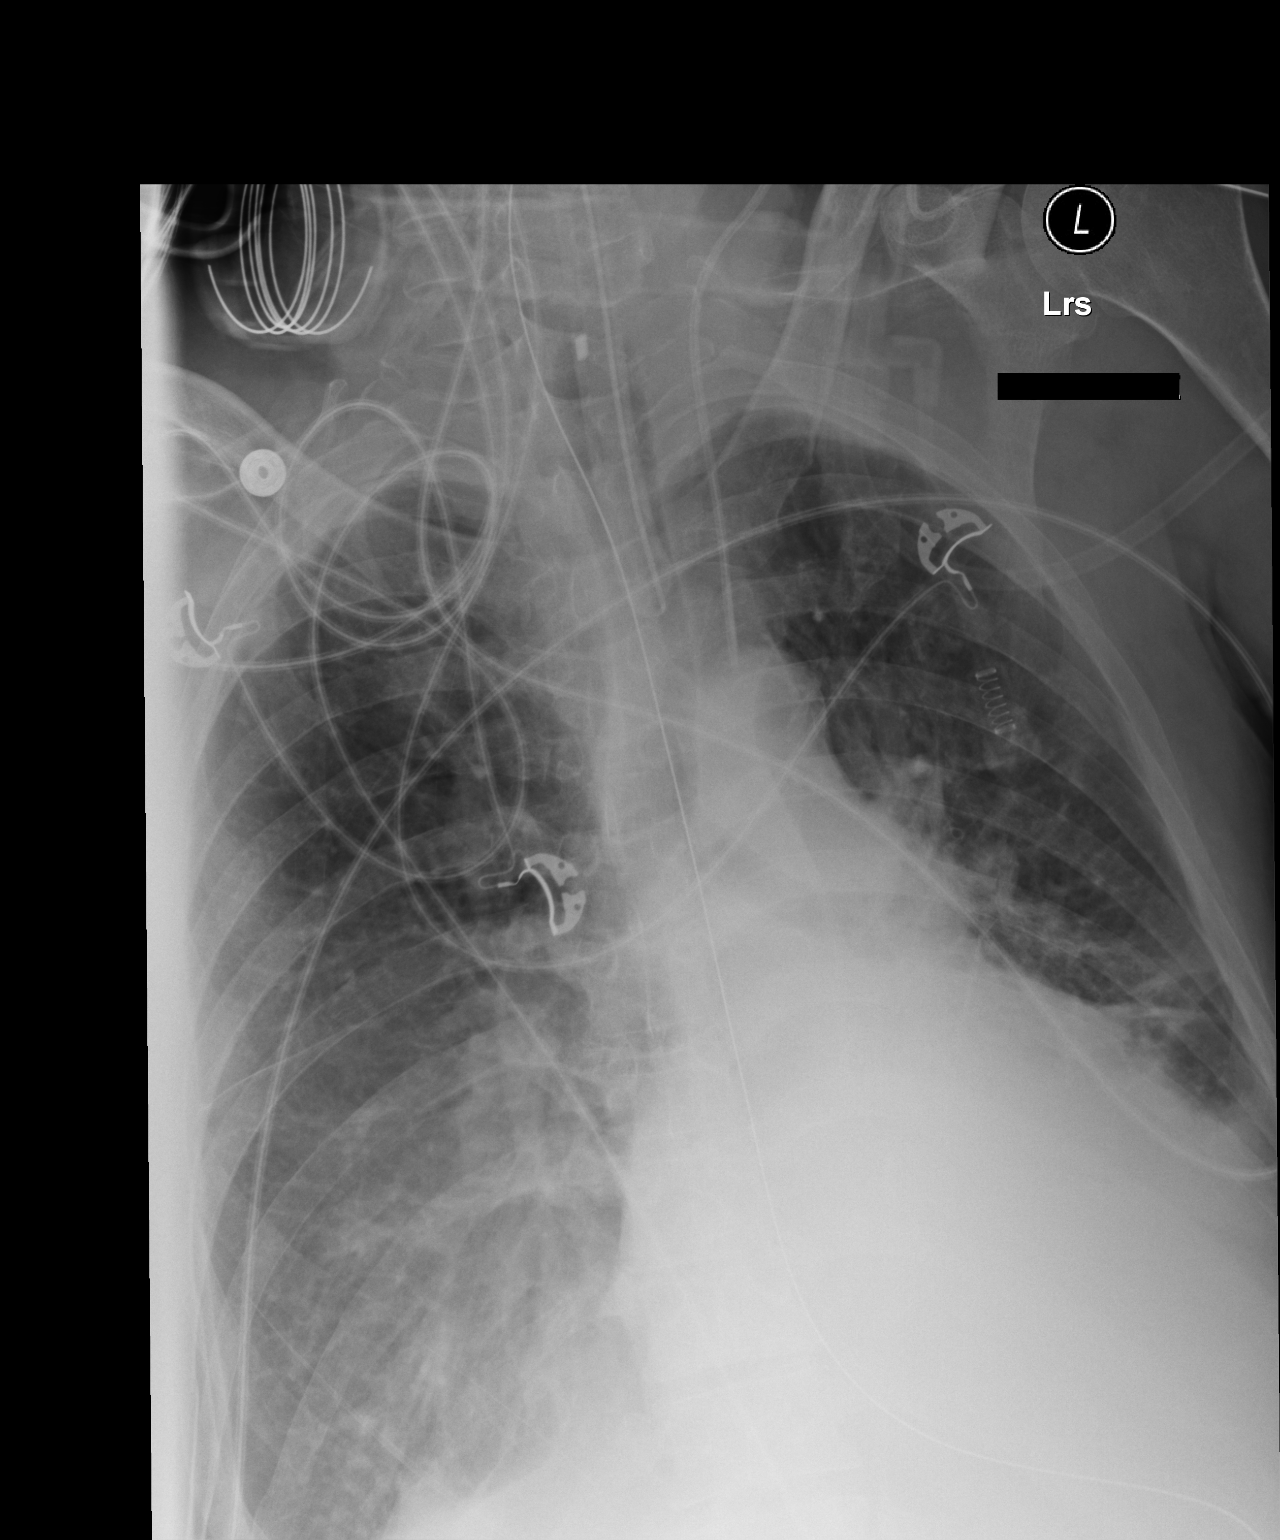

[2 of 2 positions shown; findings below may reference images not displayed]

FINDINGS: Endotracheal tube and NG tube appear in good position.
The left jugular vein catheter has retracted and is now in the left
innominate vein adjacent to the top of the aortic arch.

There are persistent bilateral effusions with
consolidation/atelectasis at the left lung base and persistent
pulmonary vascular congestion.
IMPRESSION: No significant change except for retraction of the tip of the
jugular vein catheter into the distal left innominate vein.

## 2012-08-27 IMAGING — CR DG CHEST 1V PORT
1 series · 1 of 1 positions shown · non-contrast
Comparison: 02/19/2012

CLINICAL DATA: Endotracheal tube.  Shortness of breath.

PORTABLE CHEST - 1 VIEW

[AP]
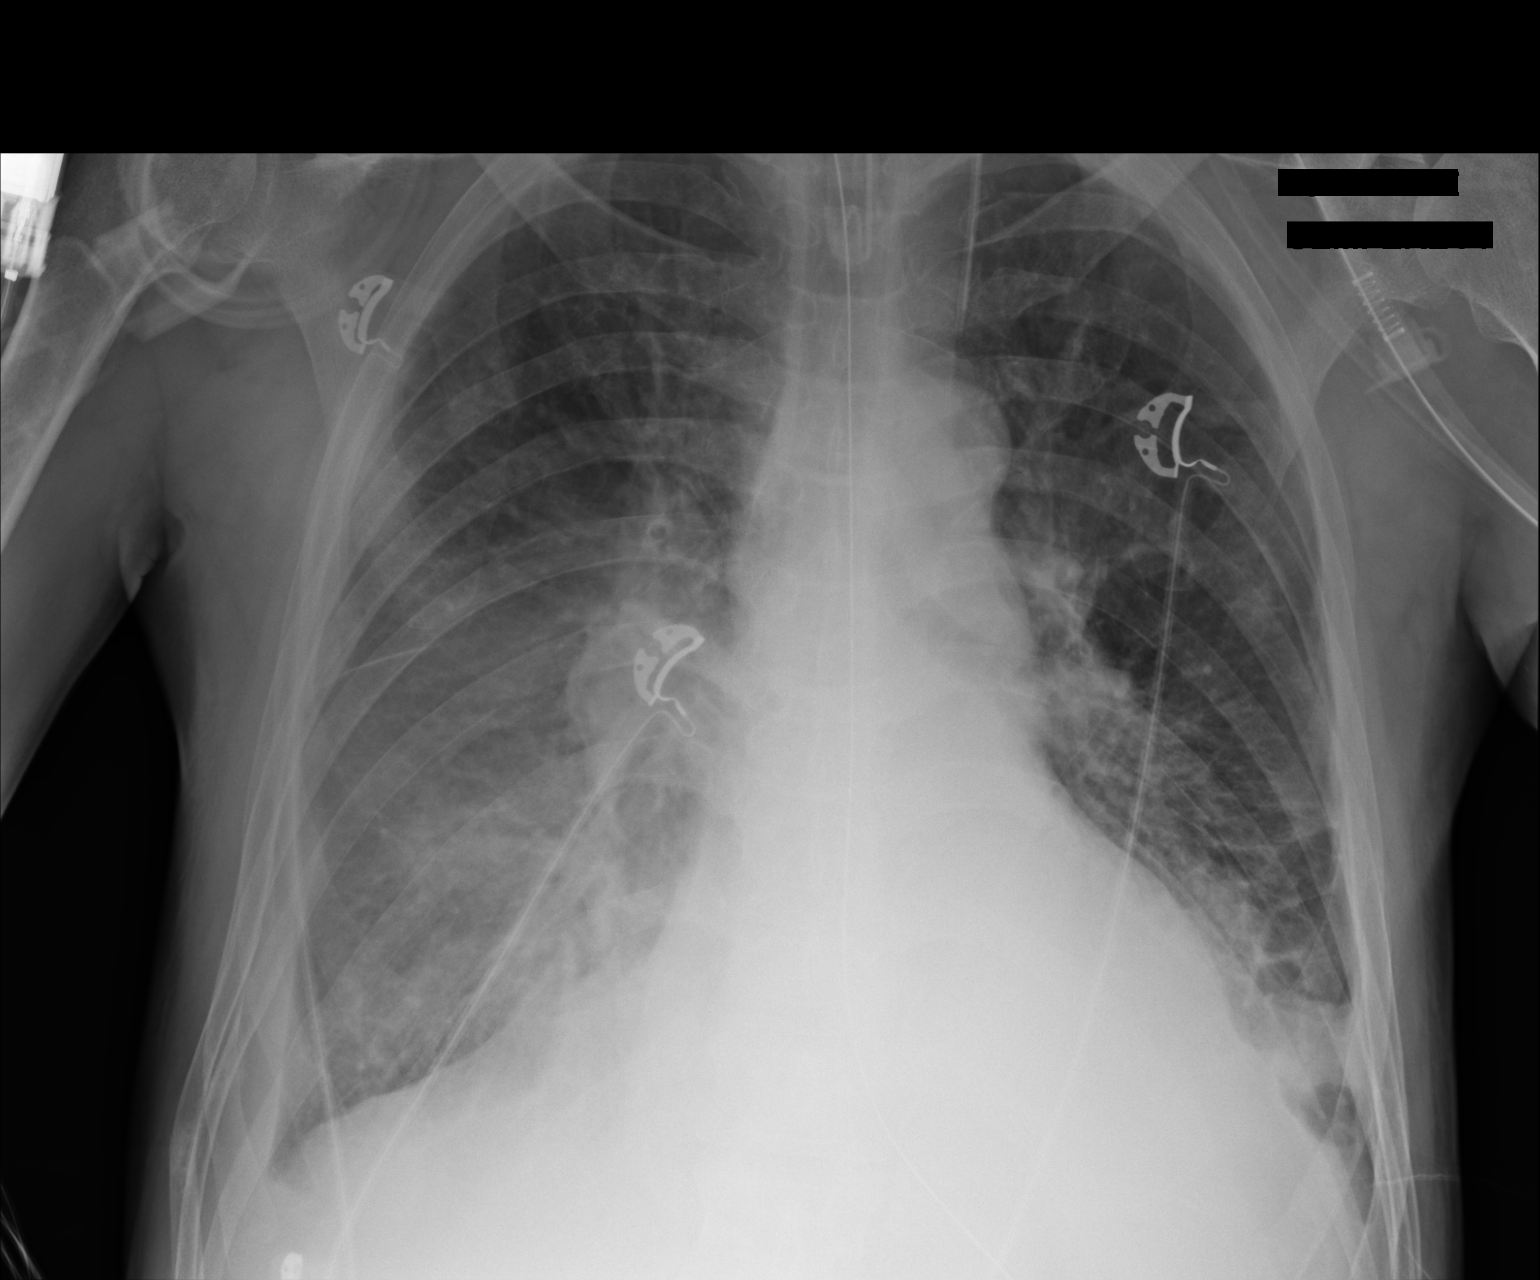

[1 of 1 positions shown; findings below may reference images not displayed]

FINDINGS: Endotracheal tube is 7.6 cm above the carina.  Hazy
densities in the right mid and lower lung have minimally changed.
Persistent opacification at the left lung base with prominent
interstitial markings.   Left jugular central line in the left
innominate vein in the region and unchanged.  Nasogastric tube
extends into the abdomen.
IMPRESSION: Minimal change in the appearance of the lungs.  There appears to be
bilateral pleural and parenchymal disease.  Evidence for right
pleural fluid.  Persistent consolidation at the left lung base.

## 2012-08-27 IMAGING — CR DG CHEST 1V PORT
1 series · 1 of 1 positions shown · non-contrast
Comparison: 02/20/2012.

CLINICAL DATA: Tracheostomy and central line placement.

PORTABLE CHEST - 1 VIEW

[AP]
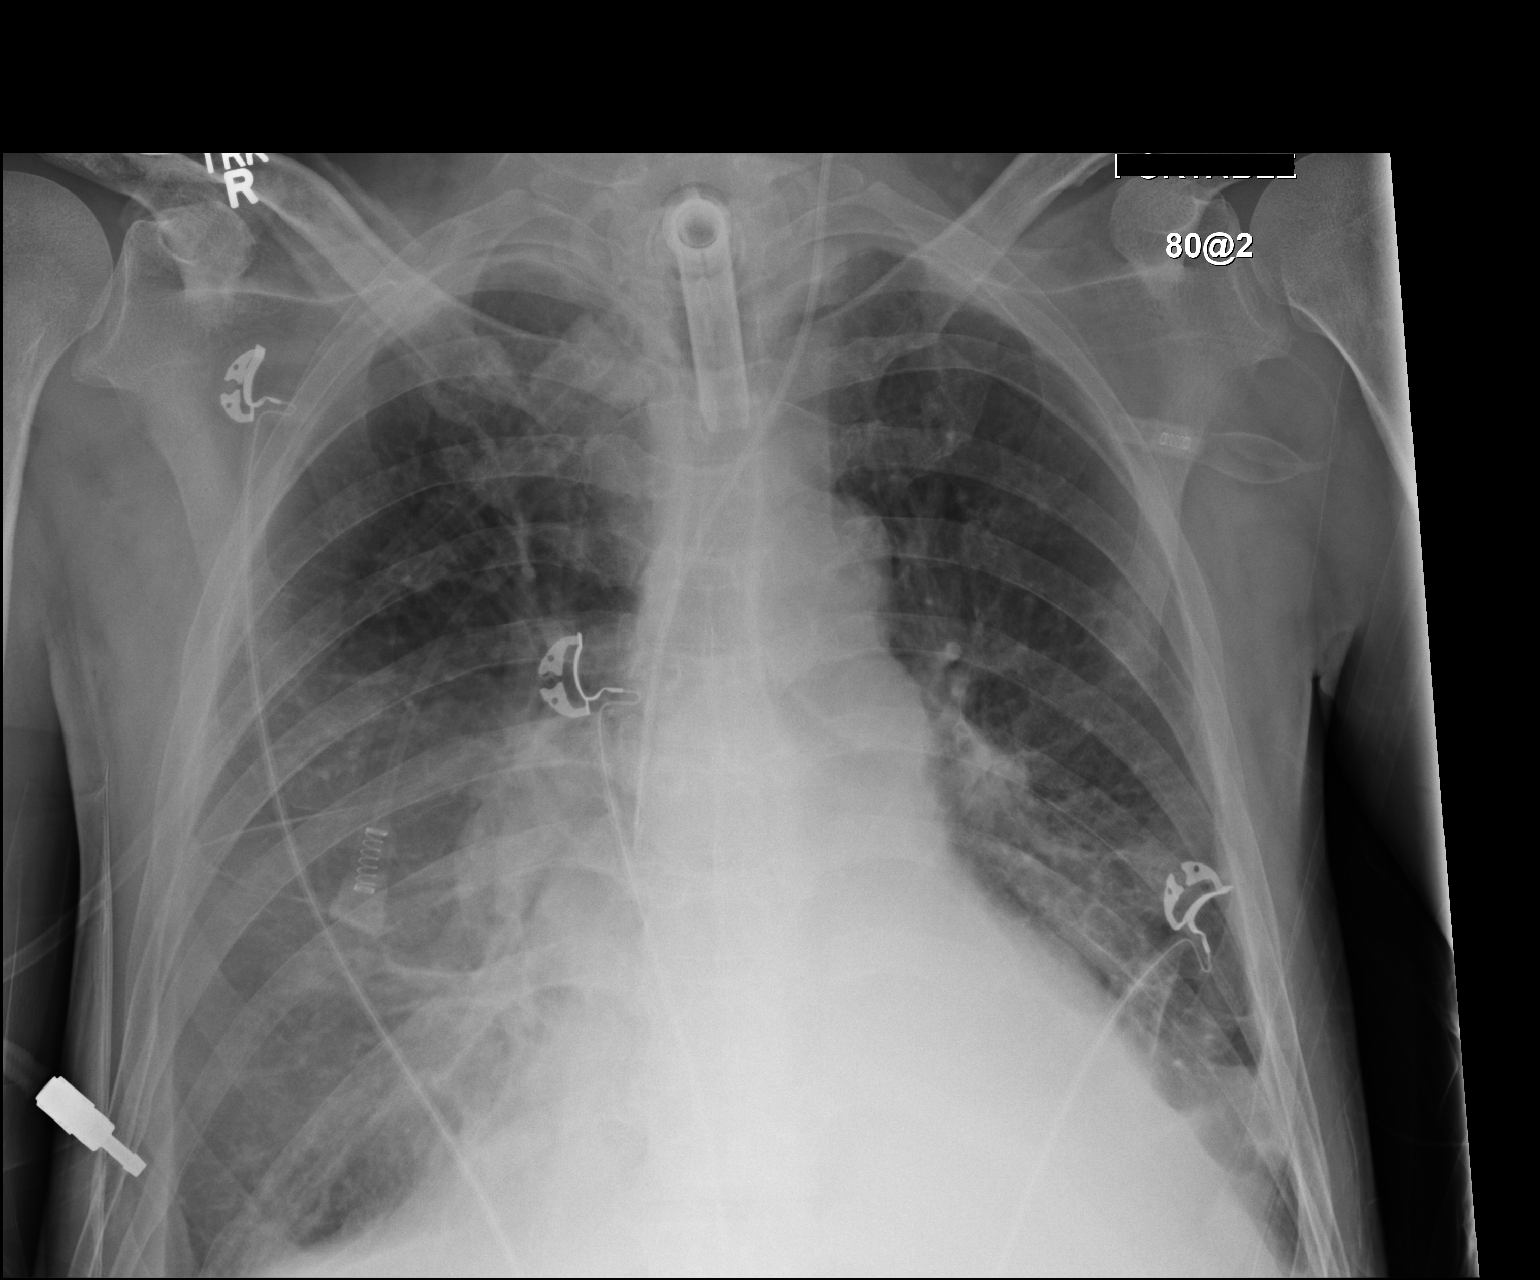

[1 of 1 positions shown; findings below may reference images not displayed]

FINDINGS: Tracheostomy is midline.  Left IJ central line tip
projects over the SVC.  The heart size is stable.  There is
bibasilar air space disease, left greater than right.  No
pneumothorax.  Biapical pleural thickening.  Bilateral pleural
effusions.
IMPRESSION: 1.  No pneumothorax after left IJ central line placement.
2.  Bibasilar air space disease, left greater than right, with
bilateral pleural effusions.

## 2012-08-28 IMAGING — CR DG CHEST 1V PORT
2 series · 2 of 2 positions shown · non-contrast
Comparison: 02/20/2012.

CLINICAL DATA: Respiratory failure.  Left lower lobe pneumonia.
Tracheostomy tube placed yesterday.

PORTABLE CHEST - 1 VIEW

[AP (1 of 2)]
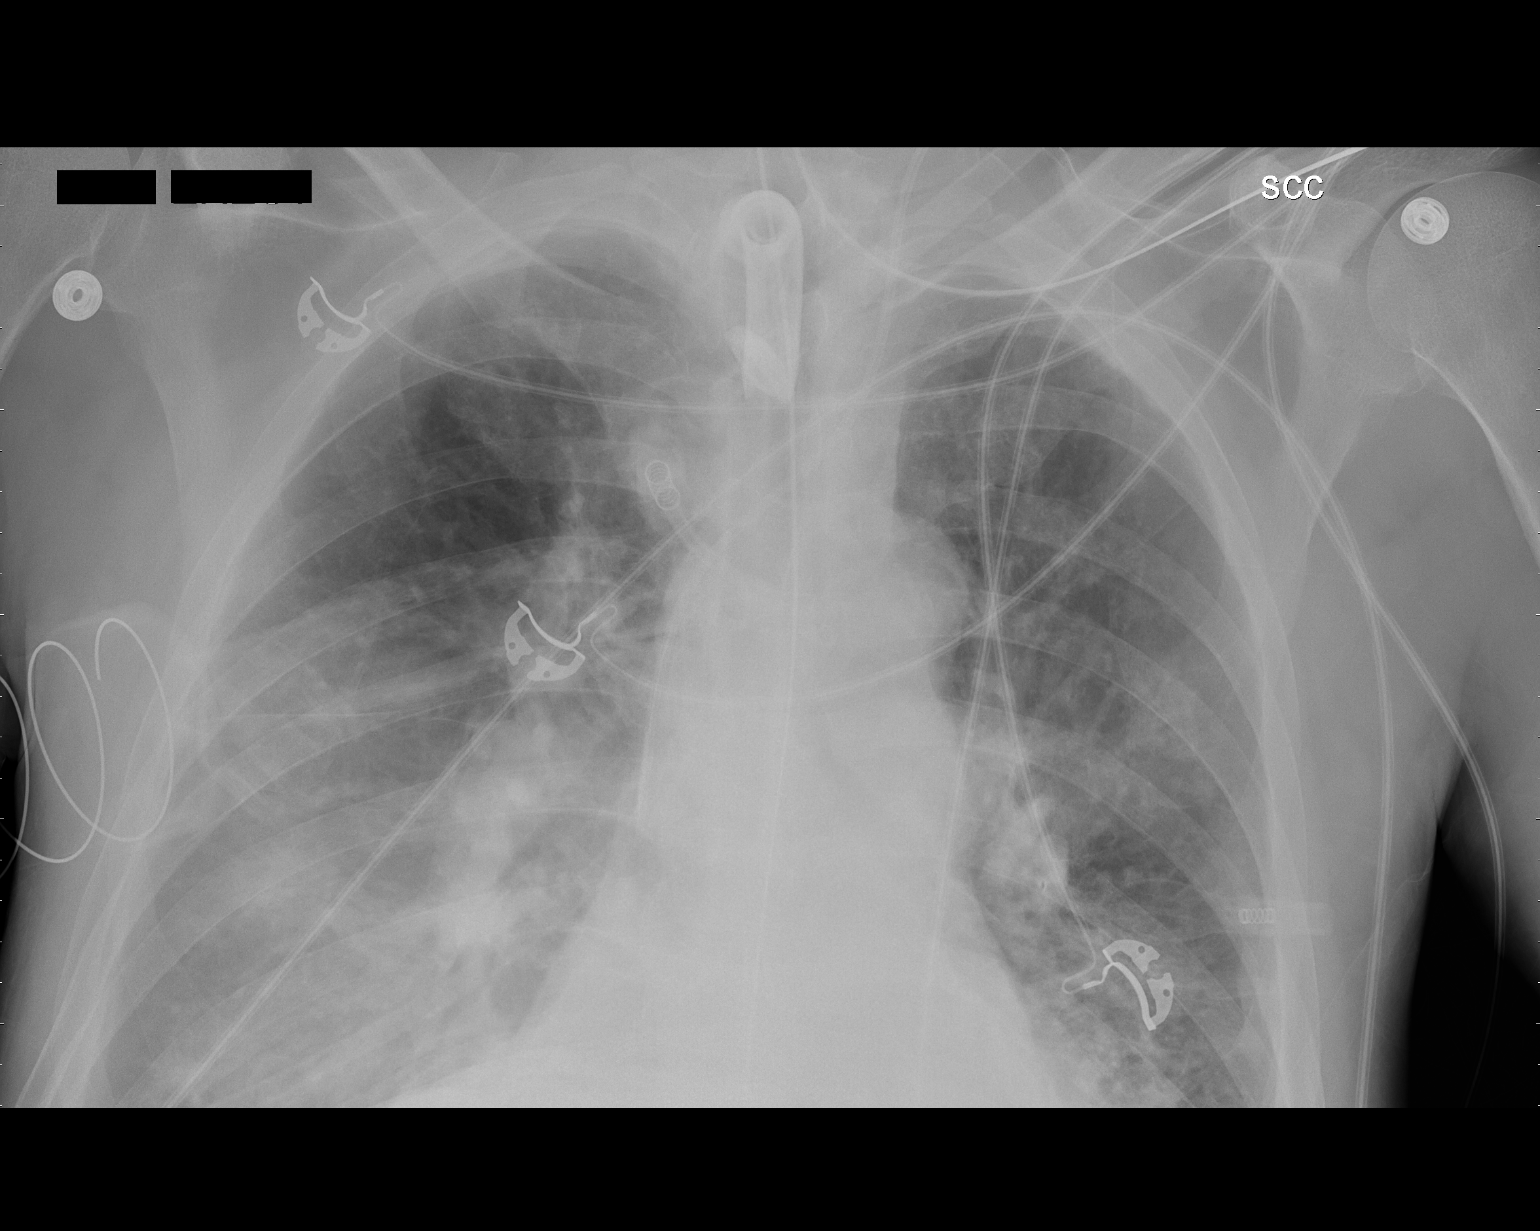

[AP (2 of 2)]
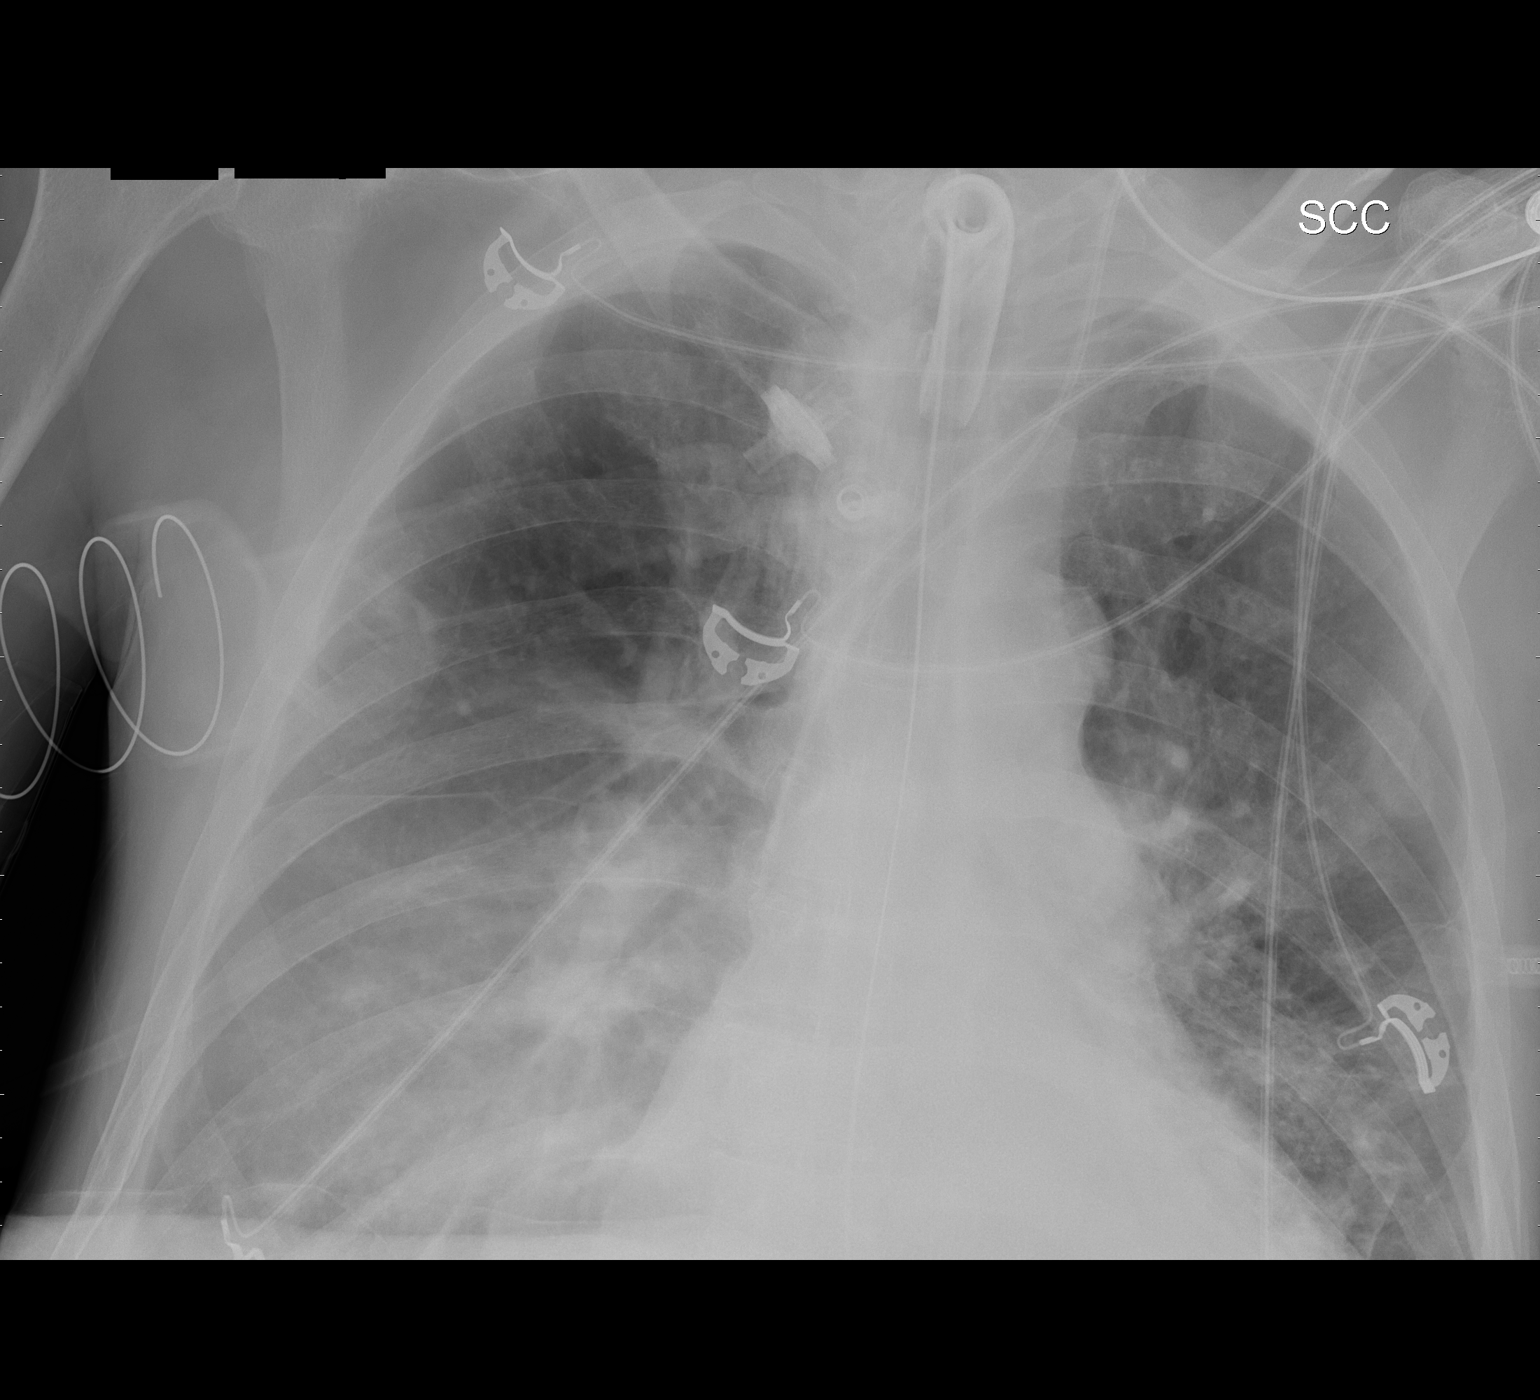

[2 of 2 positions shown; findings below may reference images not displayed]

FINDINGS: Tracheostomy tube tip midline. Nasogastric tube courses
below the diaphragm.  The tip is not included on this exam.  The
left central line tip mid superior vena cava level.  No gross
pneumothorax.

Asymmetric air space disease has progressed with patchy appearance
in the mid to lower lung zones.  This may represent infectious
infiltrate superimposed upon pulmonary edema.  Left lower lobe
atelectasis may be present.  Follow-up until clearance recommended.

Heart size top normal.  Mildly tortuous aorta.
IMPRESSION: Asymmetric air space disease has progressed with patchy appearance
in the mid to lower lung zones.  This may represent infectious
infiltrate superimposed upon pulmonary edema.  Left lower lobe
atelectasis may be present.

## 2012-08-29 IMAGING — CR DG CHEST 1V PORT
1 series · 1 of 1 positions shown · non-contrast
Comparison: 02/21/2012

CLINICAL DATA: Respiratory failure.  Cough.

PORTABLE CHEST - 1 VIEW

[AP]
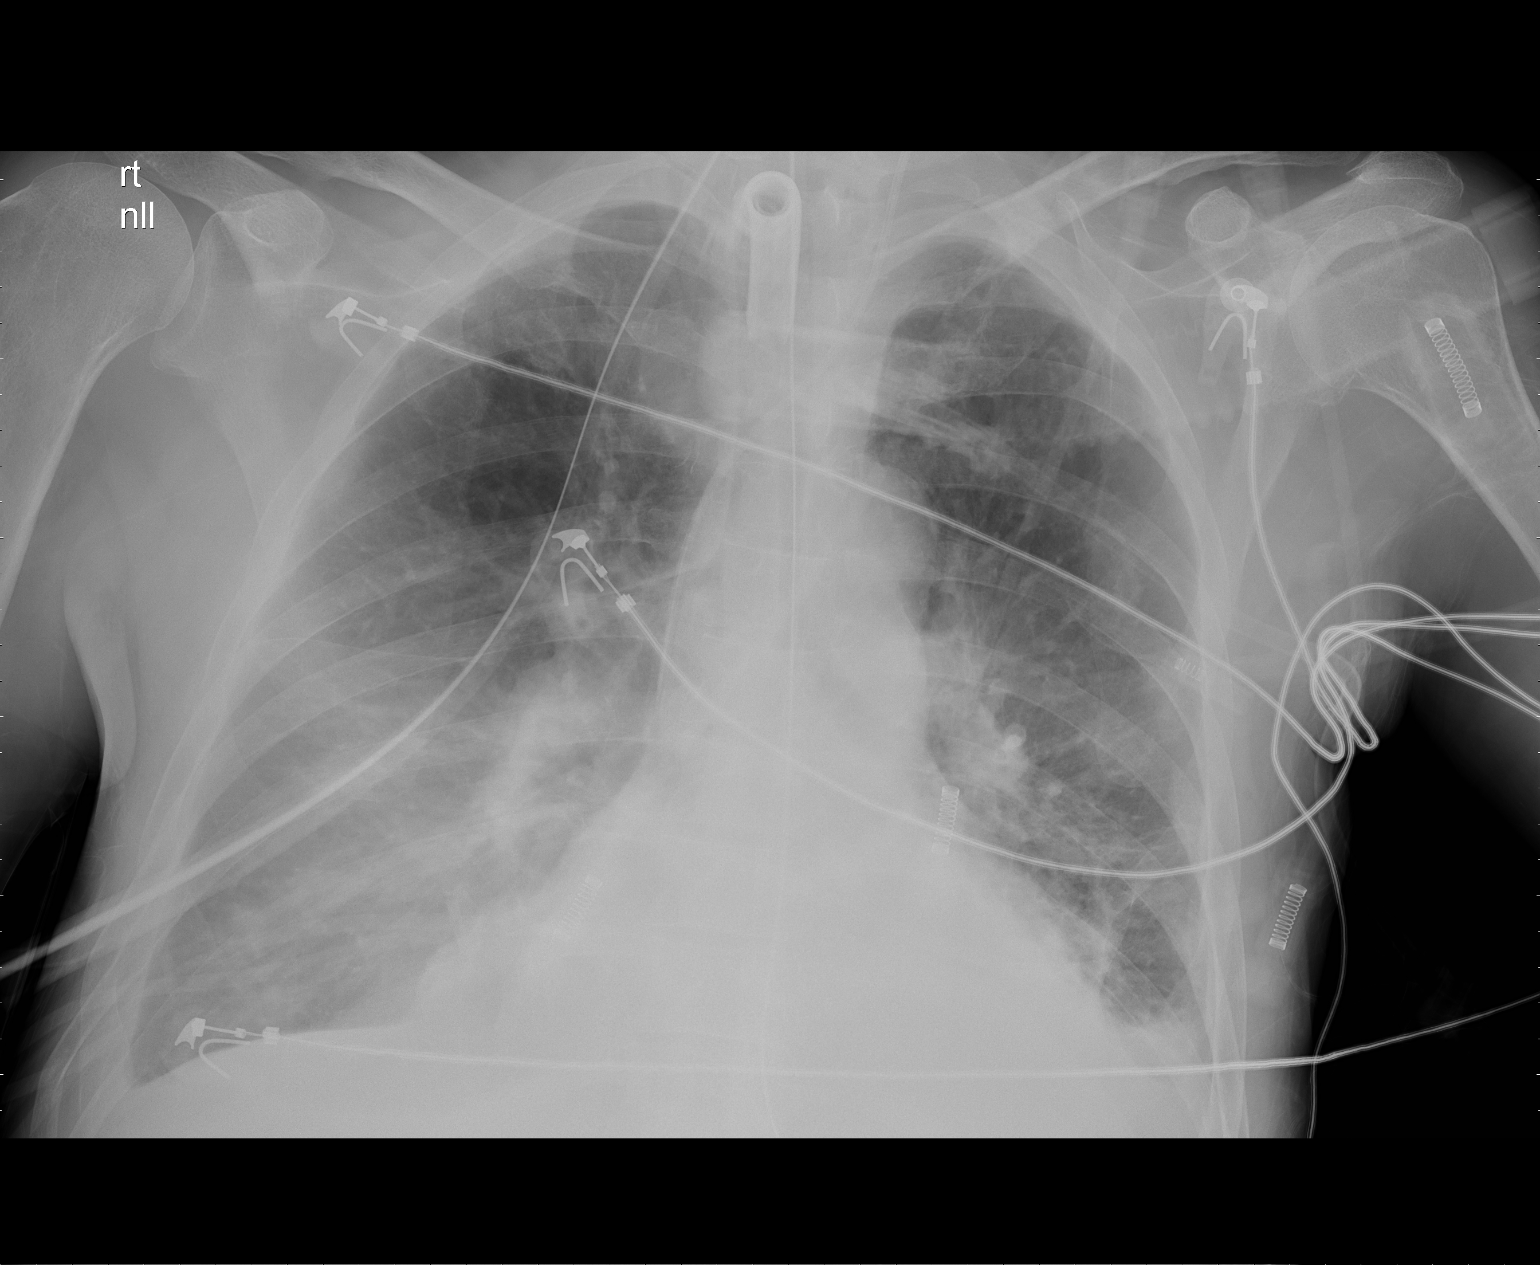

[1 of 1 positions shown; findings below may reference images not displayed]

FINDINGS: Tracheostomy tube projects over the tracheal air column.
A nasogastric tube extends into the stomach.

Left IJ central line tip projects over the SVC.

Bibasilar airspace opacities are observed with bilateral
interstitial opacity and hazy opacity particularly in the right
hemithorax favoring layering pleural effusion.  Possible lateral
fracture of the right eighth rib noted.  Borderline cardiomegaly.
IMPRESSION: 1.  Bibasilar airspace opacities with layering pleural effusion on
the right.
2.  Borderline cardiomegaly.
3.  Cannot exclude right lateral eighth rib fracture.

## 2012-08-30 IMAGING — CR DG CHEST 1V PORT
1 series · 1 of 1 positions shown · non-contrast
Comparison: 02/22/2012

CLINICAL DATA: Hypoxia, tracheostomy

PORTABLE CHEST - 1 VIEW

[AP]
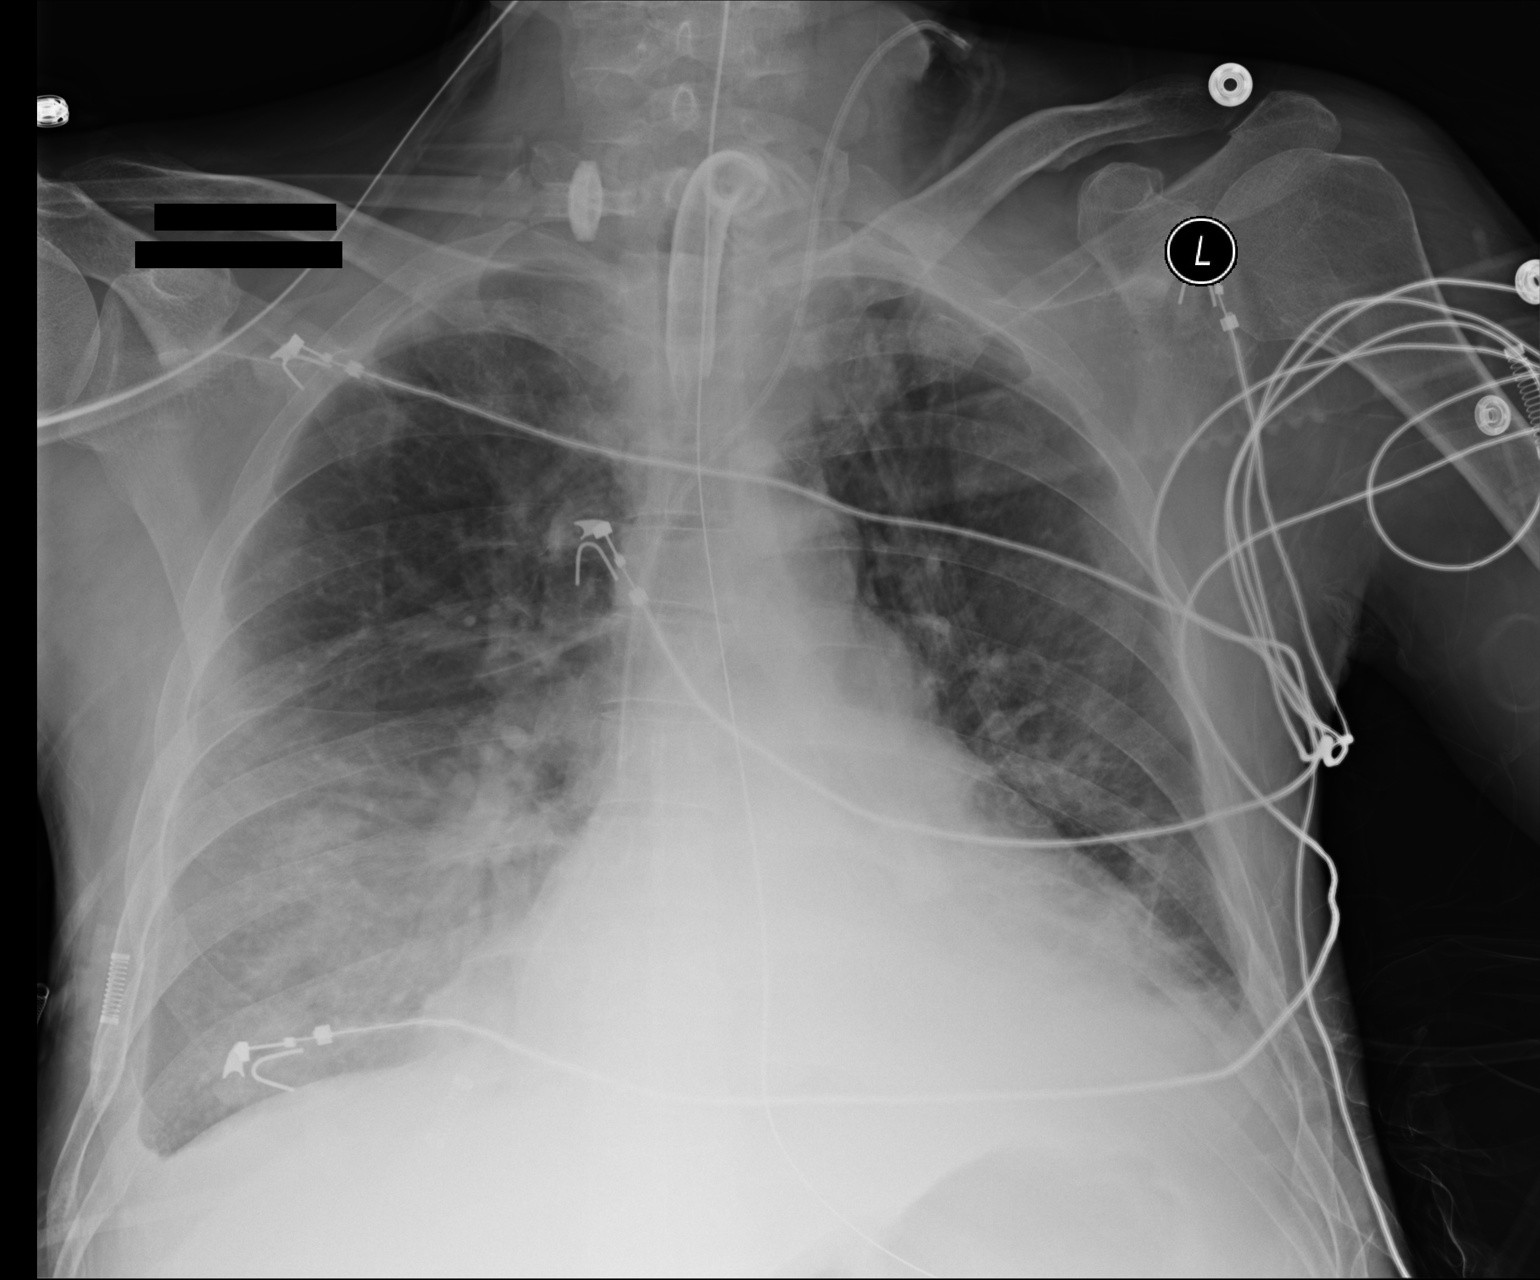

[1 of 1 positions shown; findings below may reference images not displayed]

FINDINGS: Stable support apparatus.  Stable cardiomegaly with
vascular congestion and small pleural effusions.  Basilar
atelectasis / consolidation persist, worse in the left lower lobe.
No significant interval change.  No pneumothorax.
IMPRESSION: Stable portable chest exam.

## 2012-09-07 IMAGING — CR DG CHEST 1V PORT
1 series · 1 of 1 positions shown · non-contrast
Comparison: Most recent prior chest x-ray of 02/28/2012

CLINICAL DATA: Hypoxia, tracheostomy

PORTABLE CHEST - 1 VIEW

[view not recorded]
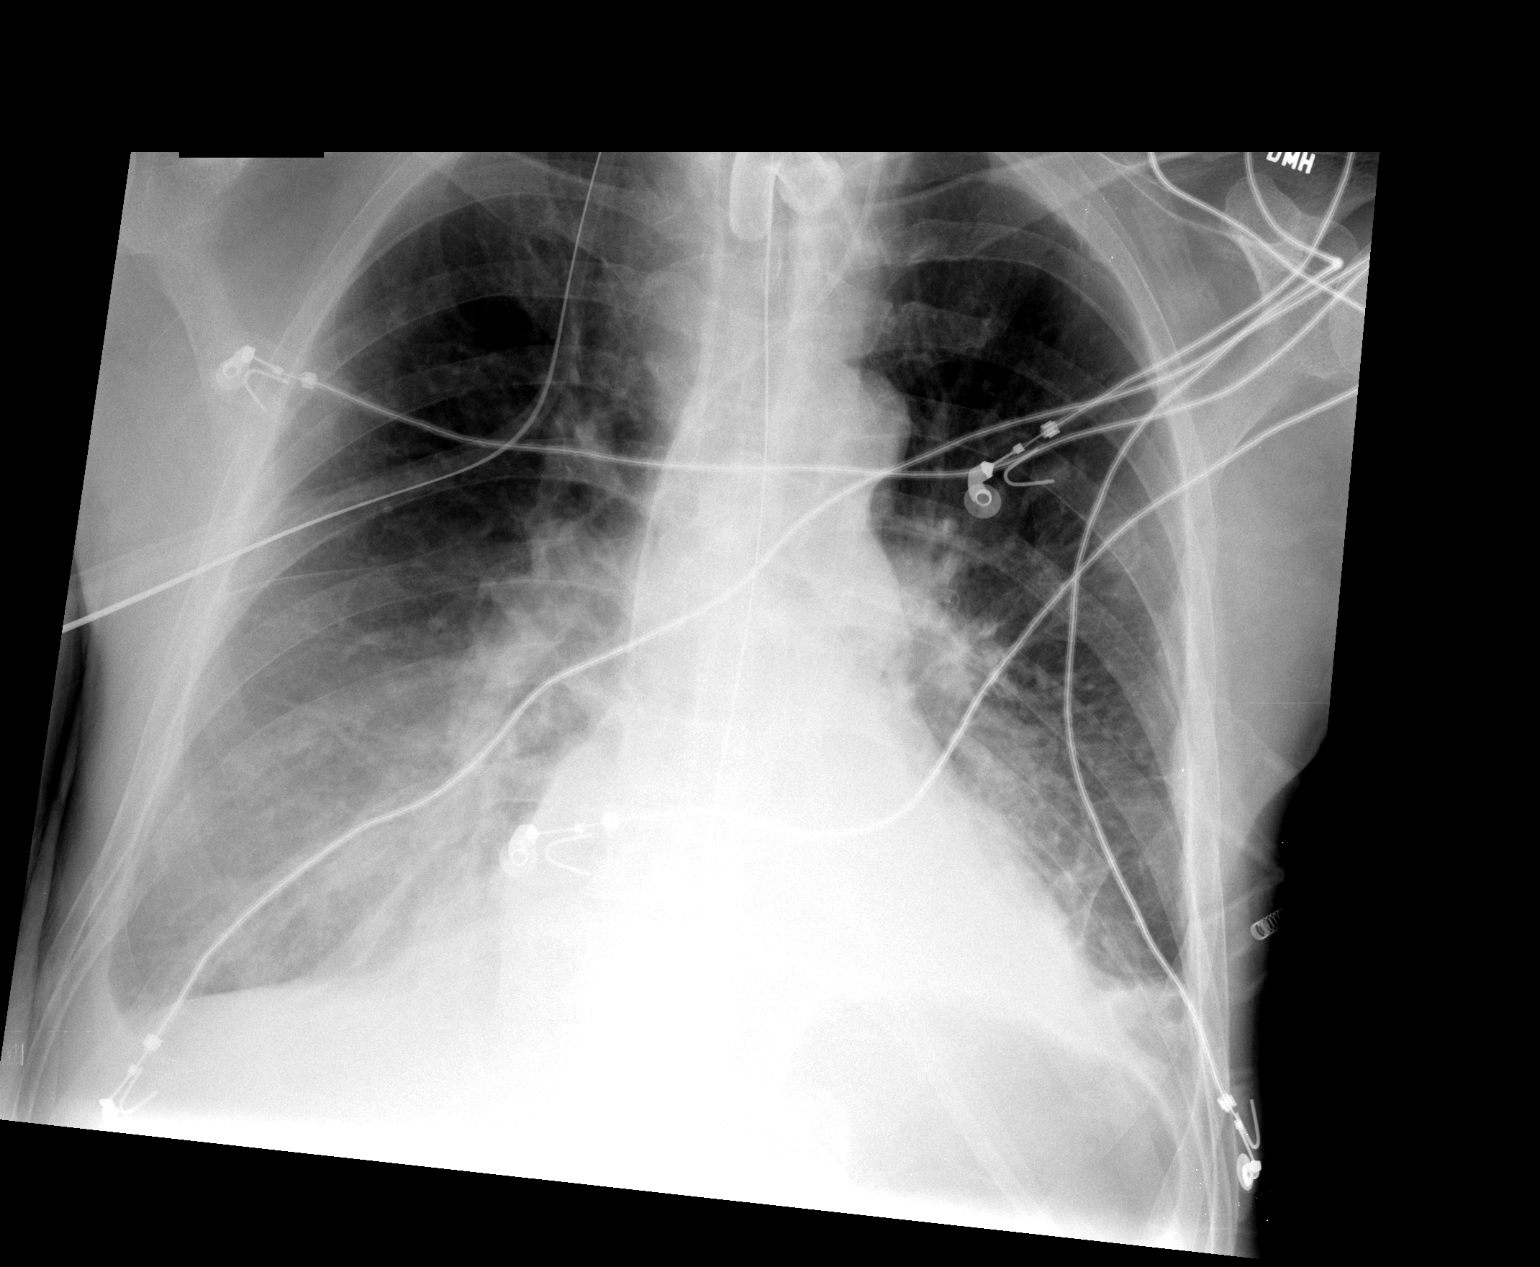

[1 of 1 positions shown; findings below may reference images not displayed]

FINDINGS: Tracheostomy tube remains in good position.  Tip is
midline at the level of the clavicles.  Left IJ approach central
venous catheter.  The tip is in the distal SVC.  A nasogastric tube
is present, the tip is not identified but lies below the diaphragm,
likely in the stomach.  Unchanged mild cardiomegaly.  Slightly
increased right pleural effusion and basilar opacity.  Pulmonary
vascularity is unchanged.  No edema.
IMPRESSION: 1.  Support apparatus in satisfactory and unchanged position as
above.
2.  Increased right pleural effusion and associated basilar opacity
likely reflecting a combination of the pleural fluid with
atelectasis versus consolidation.
3.  Left basilar atelectasis versus consolidation is unchanged.

## 2012-09-14 IMAGING — CR DG CHEST 1V PORT
1 series · 1 of 1 positions shown · non-contrast
Comparison: 03/02/2012

CLINICAL DATA: Tracheostomy

PORTABLE CHEST - 1 VIEW

[AP]
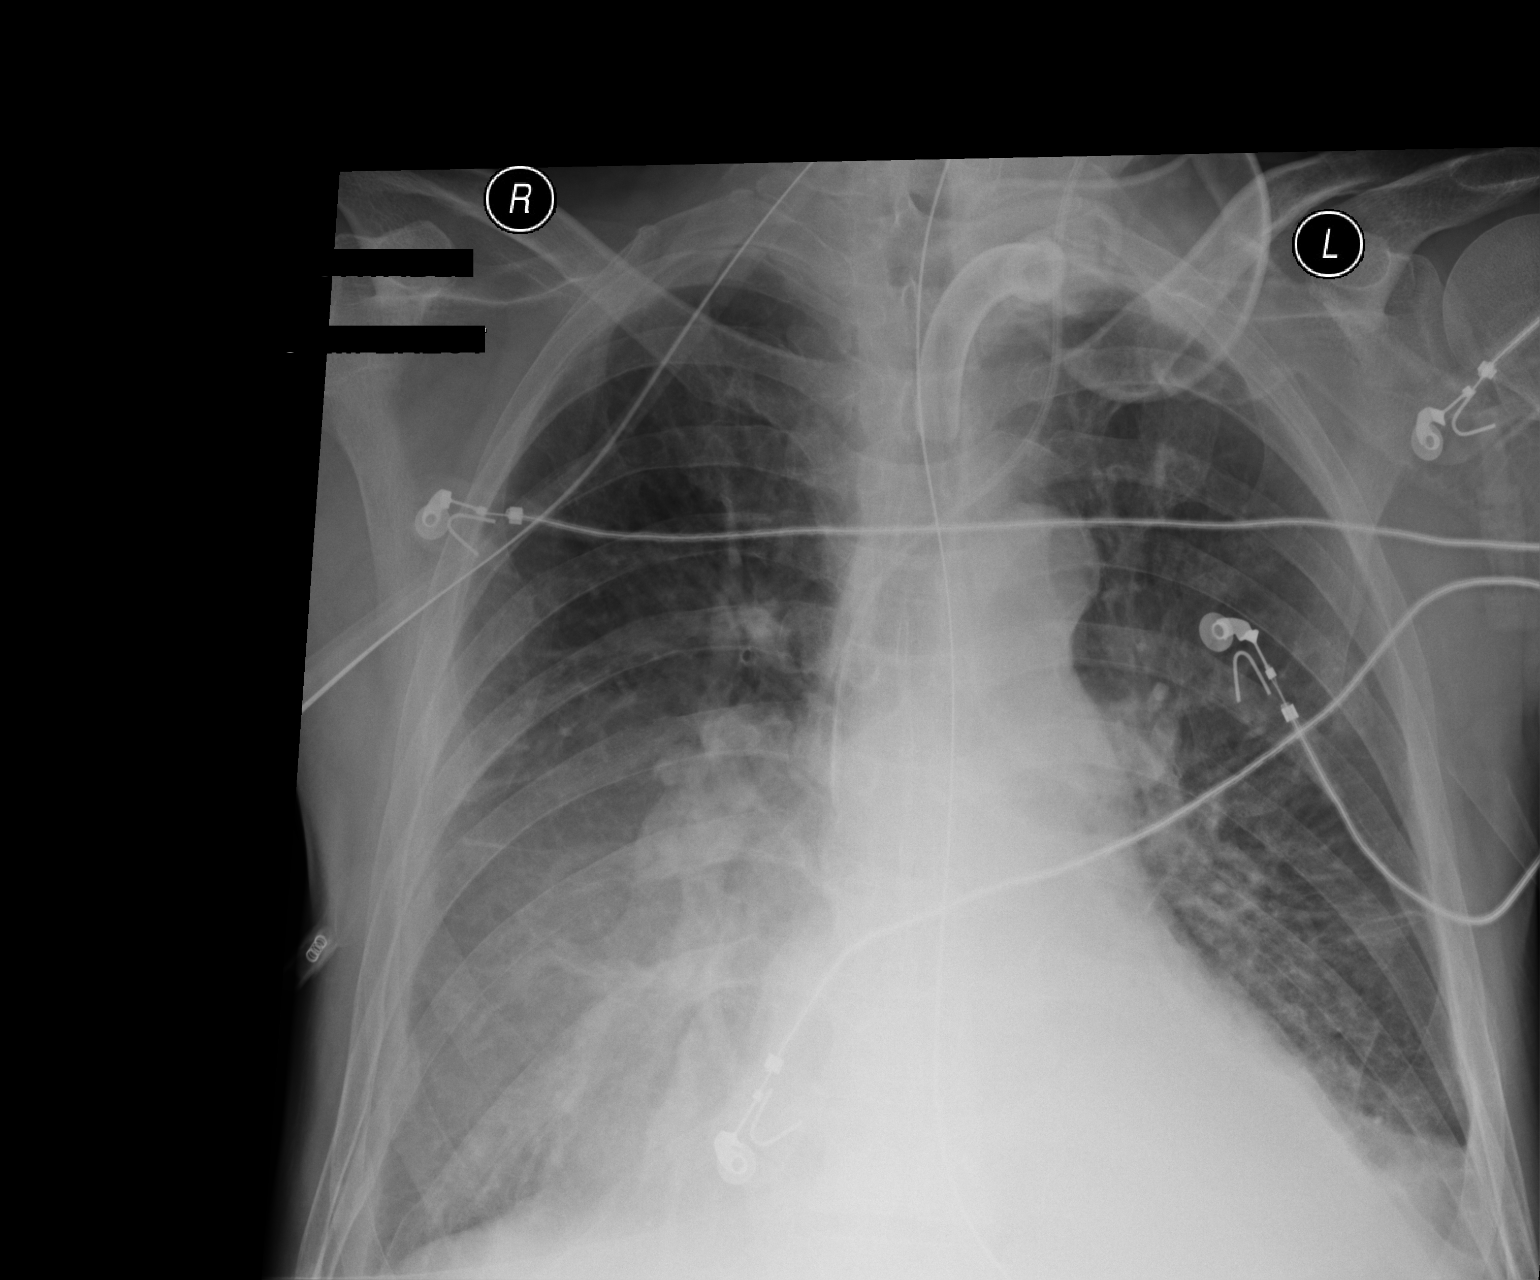

[1 of 1 positions shown; findings below may reference images not displayed]

FINDINGS: Cardiomediastinal silhouette is stable.  Tracheostomy
tube is unchanged in position.  Stable NG tube position and left IJ
central line position.
Small right pleural effusion with right lower lobe atelectasis or
infiltrate.  Stable left basilar atelectasis or infiltrate.
IMPRESSION: Tracheostomy tube is unchanged in position.  Stable NG tube
position and left IJ central line position.
Small right pleural effusion with right lower lobe atelectasis or
infiltrate.  Stable left basilar atelectasis or infiltrate.

## 2012-09-19 IMAGING — CR DG CHEST 1V PORT
1 series · 1 of 1 positions shown · non-contrast
Comparison: 03/09/2012; 03/02/2012; 02/28/2012

CLINICAL DATA: Pneumonia

PORTABLE CHEST - 1 VIEW

[AP]
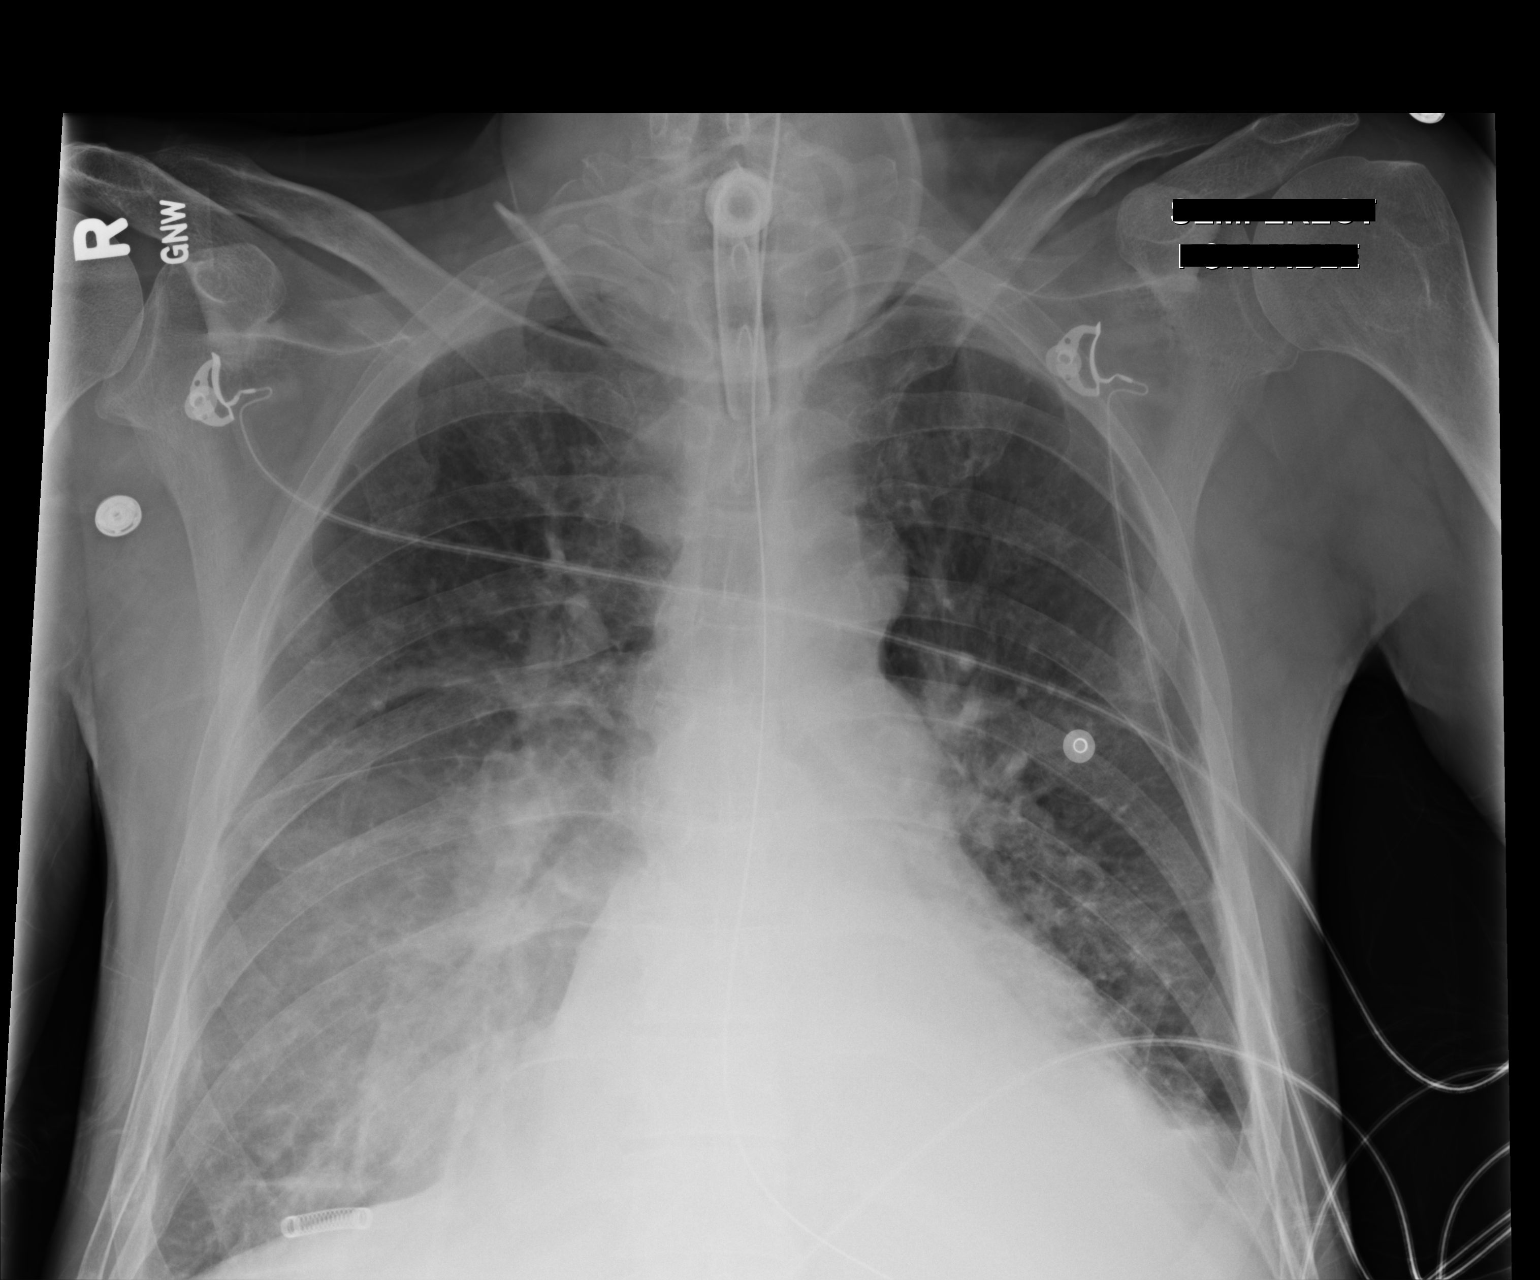

[1 of 1 positions shown; findings below may reference images not displayed]

FINDINGS: Grossly unchanged enlarged cardiac silhouette and
mediastinal contours.  Interval removal of left jugular approach
central venous catheter.  Otherwise, stable position of support
apparatus. Grossly unchanged right mid and lower lung heterogeneous
airspace opacities.  Left retrocardiac consolidative opacities are
also unchanged.  Small left-sided effusion is suspected.  No
definite right-sided pleural effusion, though note, the right
costophrenic angle is excluded from view.  No definite
pneumothorax.  Unchanged bones.
IMPRESSION: Grossly unchanged bilateral air space opacities, possibly
atelectasis, aspiration or infection may have a similar appearance.

## 2012-09-19 IMAGING — CT CT CHEST W/O CM
2 of 3 series · 15 of 36 positions shown, 18 images · non-contrast
Comparison: Chest radiograph 03/14/2012

CLINICAL DATA: COPD, hepatitis C, polysubstance abuse.
Pancreatitis.  Right infiltrate.

CT CHEST WITHOUT CONTRAST
TECHNIQUE: Multidetector CT imaging of the chest was performed
following the standard protocol without IV contrast.

[Series 2: routine chest · axial · 0.70mm/px · z∈[-368,-78]mm · 12 of 68 slices shown, 15 images]
[im 5/68  mediastinal]
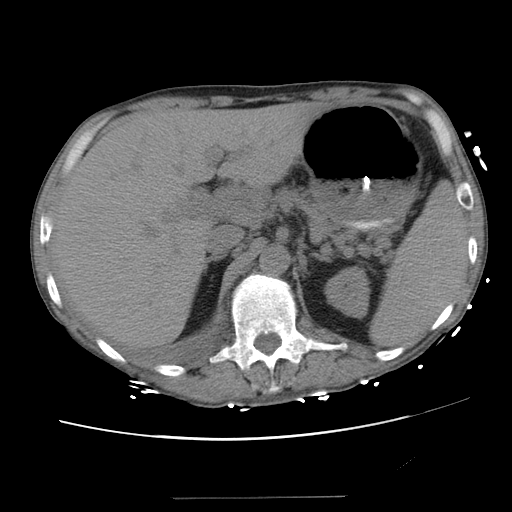
[im 5/68  lung]
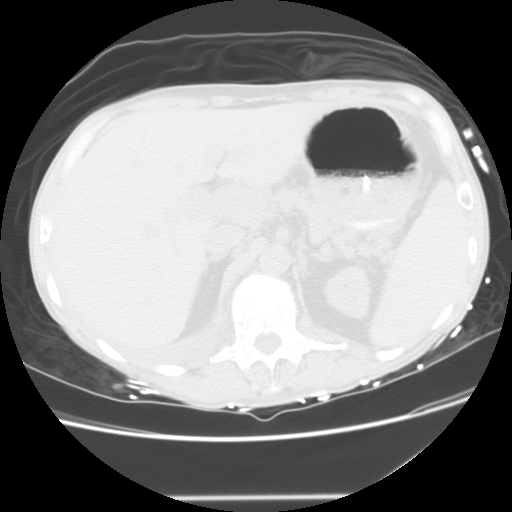
[im 10/68  lung]
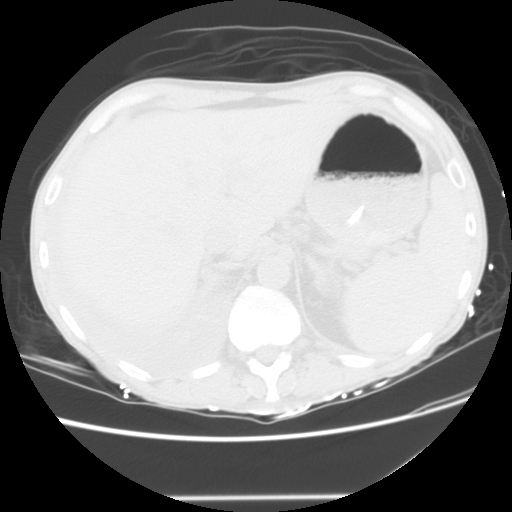
[im 15/68  lung]
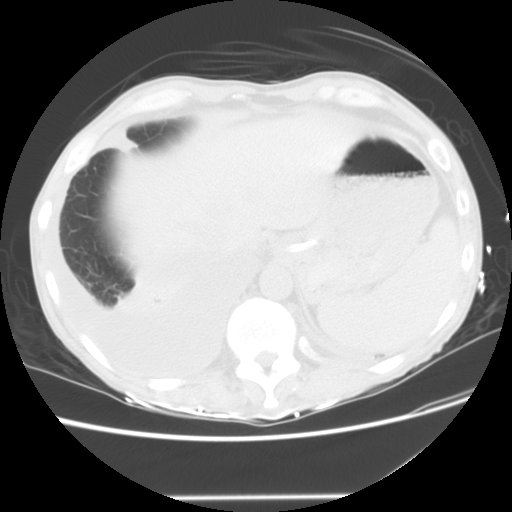
[im 20/68  lung]
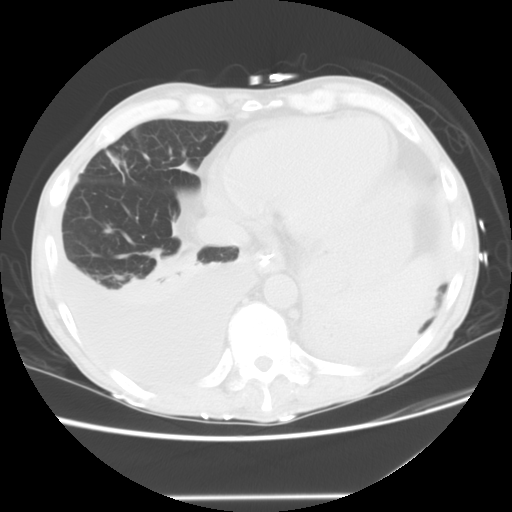
[im 25/68  mediastinal]
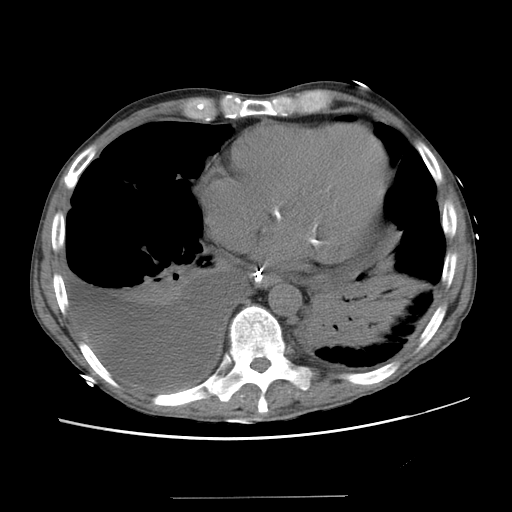
[im 25/68  lung]
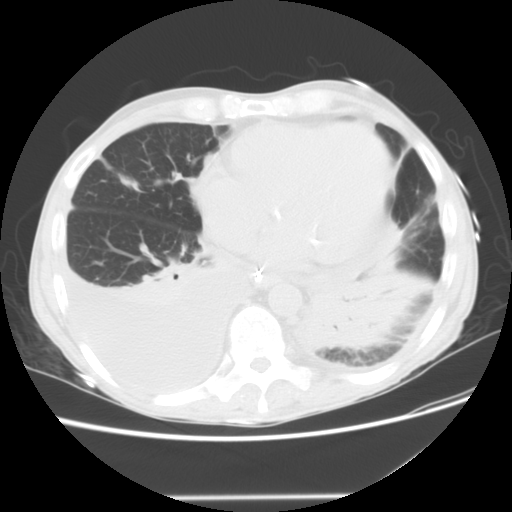
[im 30/68  lung]
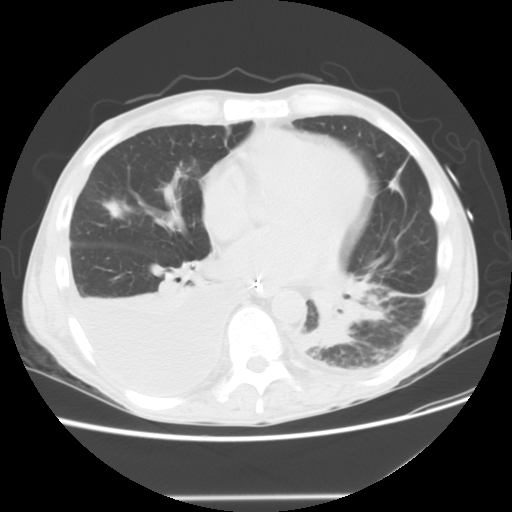
[im 38/68  lung]
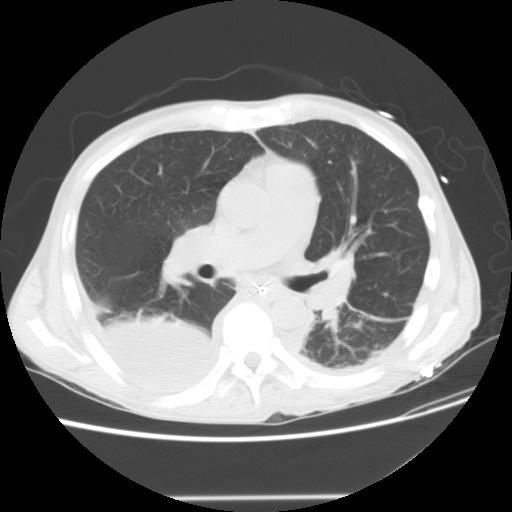
[im 43/68  lung]
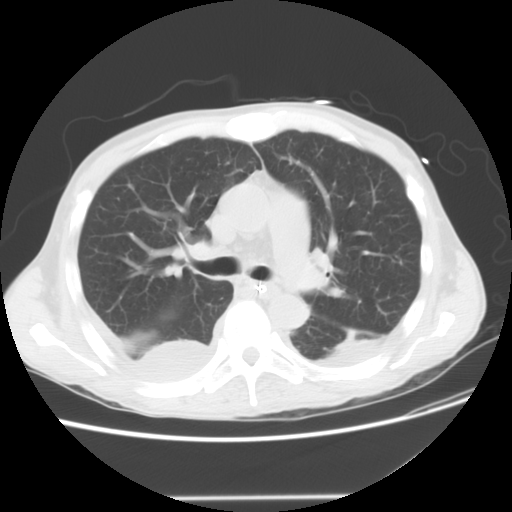
[im 48/68  mediastinal]
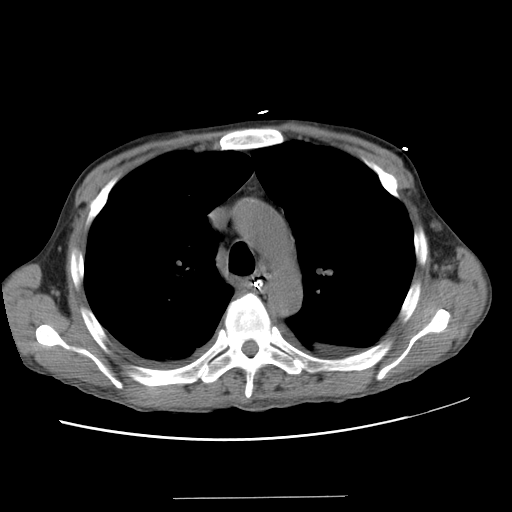
[im 48/68  lung]
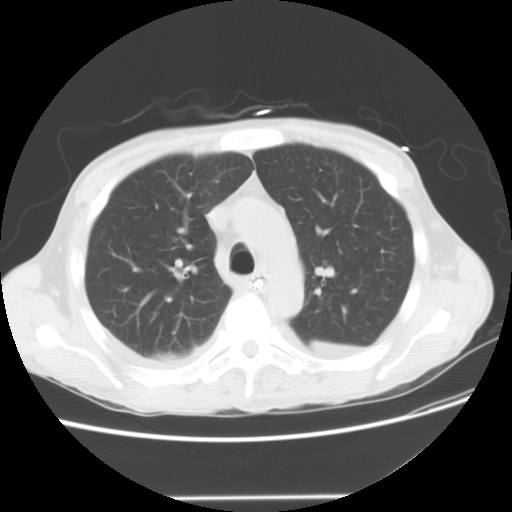
[im 53/68  lung]
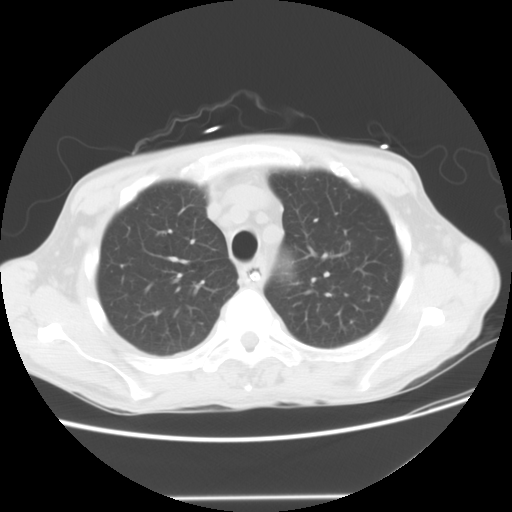
[im 58/68  lung]
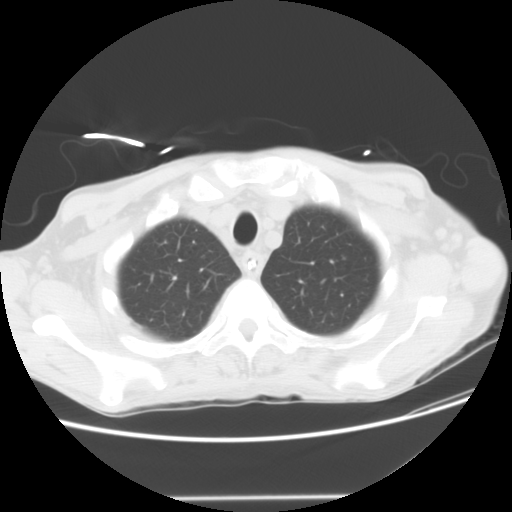
[im 63/68  lung]
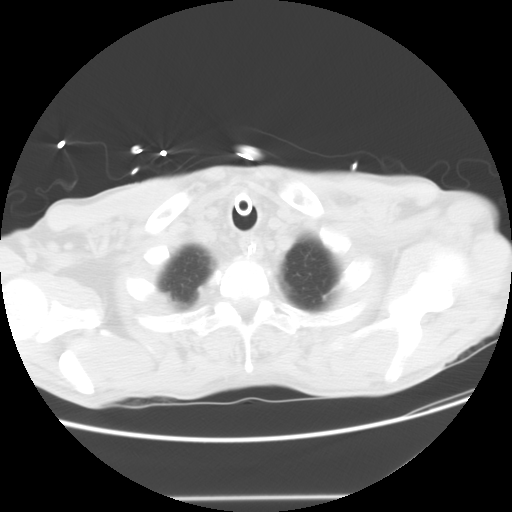

[Series 401: coronal chest · coronal · 0.70mm/px · 3 of 78 slices shown]
[im 16/78  lung]
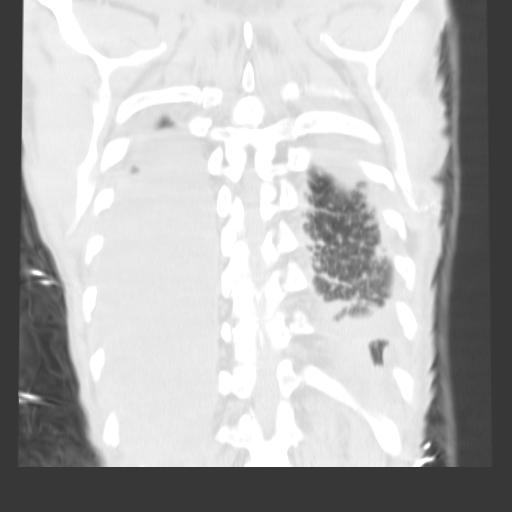
[im 31/78  lung]
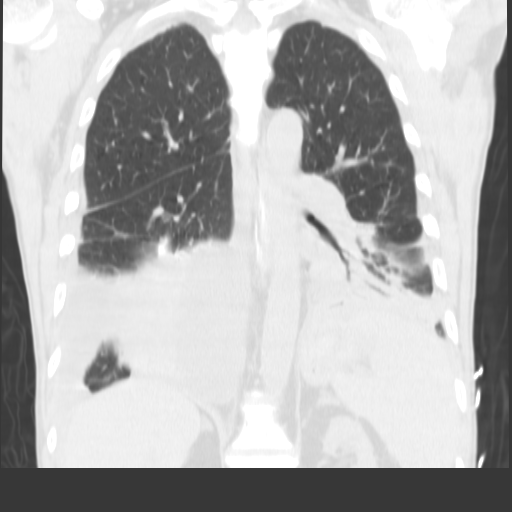
[im 47/78  lung]
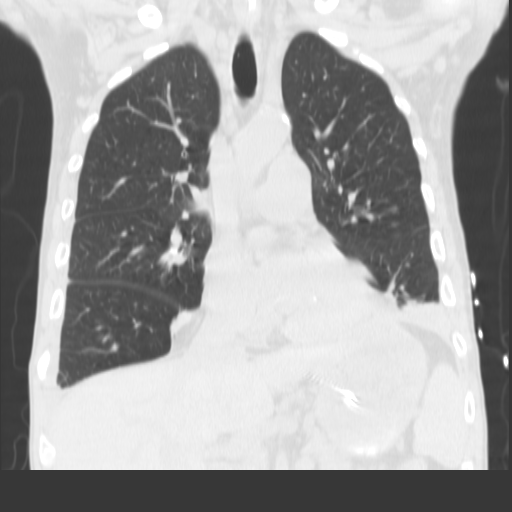

[15 of 36 positions shown; findings below may reference images not displayed]

FINDINGS: Tracheostomy tube in good position in the upper trachea.
NG tube extends to the stomach.

There are shotty axillary lymph nodes in the left and right
measuring up to 10 mm short axis.  No supraclavicular
lymphadenopathy.  Small mediastinal paratracheal lymph nodes are
not pathologic by size criteria.  There is trace pericardial
effusion.  Esophagus wall is mildly thickened to 4 mm throughout
its course.

There is a moderate sized layering right pleural effusion with
associated passive atelectasis in the right lower lobe.  There is
consolidation with air bronchograms in the left lower lobe
suggesting atelectasis and potentially infection.  There is 3 mm
nodule in the right upper lobe (image 26).  There is a 5 mm nodule
right upper lobe (image 16).  There are larger branching nodules
and  atelectasis in the right middle lobe (image 39.

Limited view of the upper abdomen unremarkable.  The limited view
of the skeleton of the is unremarkable.
IMPRESSION: 1..  Moderate right pleural effusion with right lower lobe
atelectasis.
2.  Left lower lobe consolidation with air bronchograms suggests a
combination of atelectasis and pneumonia.
3.  Linear nodular pattern in the right middle lobe suggests
infectious etiology.

4.  Small right upper lobe pulmonary nodules are indeterminate.

4. Overall this pulmonary infectious pattern is improved from the
CT abdomen of 01/23/2012 which include the lung bases.

## 2012-09-22 IMAGING — US US THORACENTESIS ASP PLEURAL SPACE W/IMG GUIDE
1 series · 7 of 7 positions shown · non-contrast
Comparison: None

CLINICAL DATA: Acute on chronic respiratory failure, aspiration
pneumonia, underlying COPD, right-sided pleural effusion

ULTRASOUND GUIDED right THORACENTESIS

[Series 1: us thoracentesis asp pleural space w/img guide · 0.32mm/px · 7 of 7 slices shown]
[im 1/7]
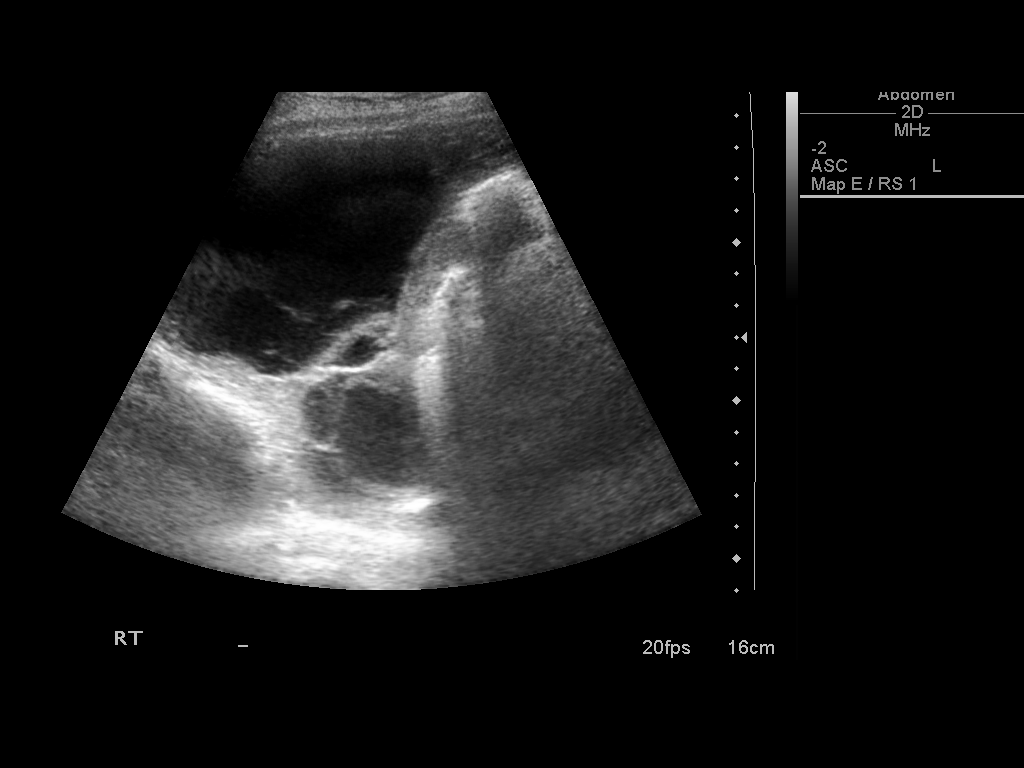
[im 2/7]
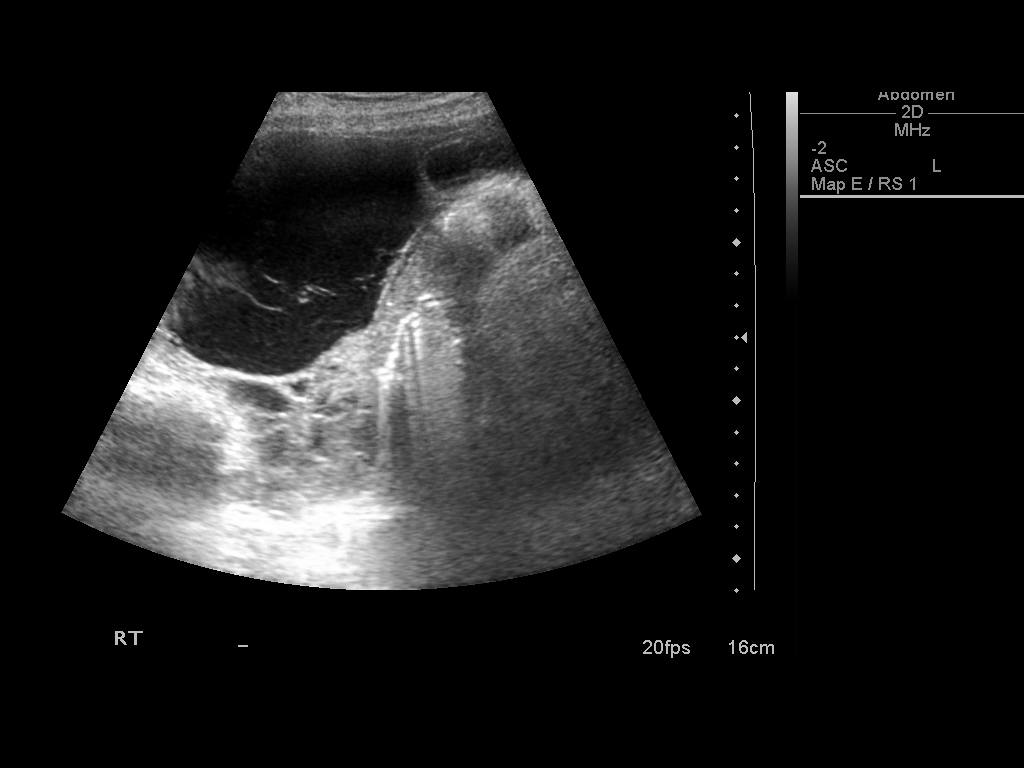
[im 3/7]
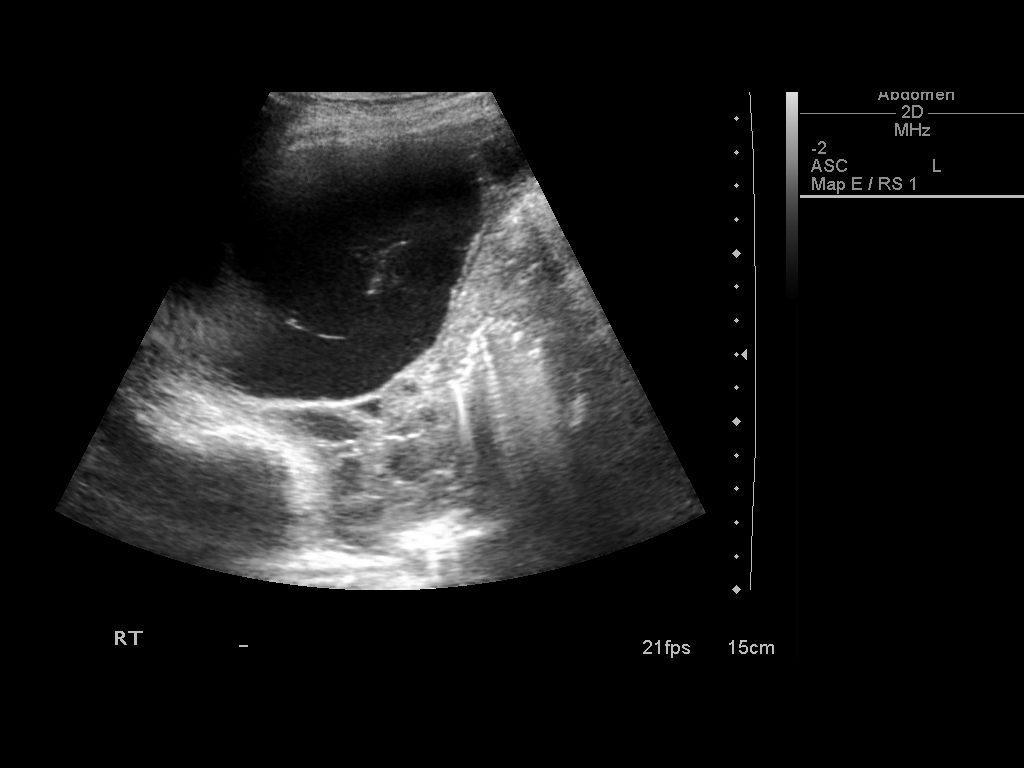
[im 4/7]
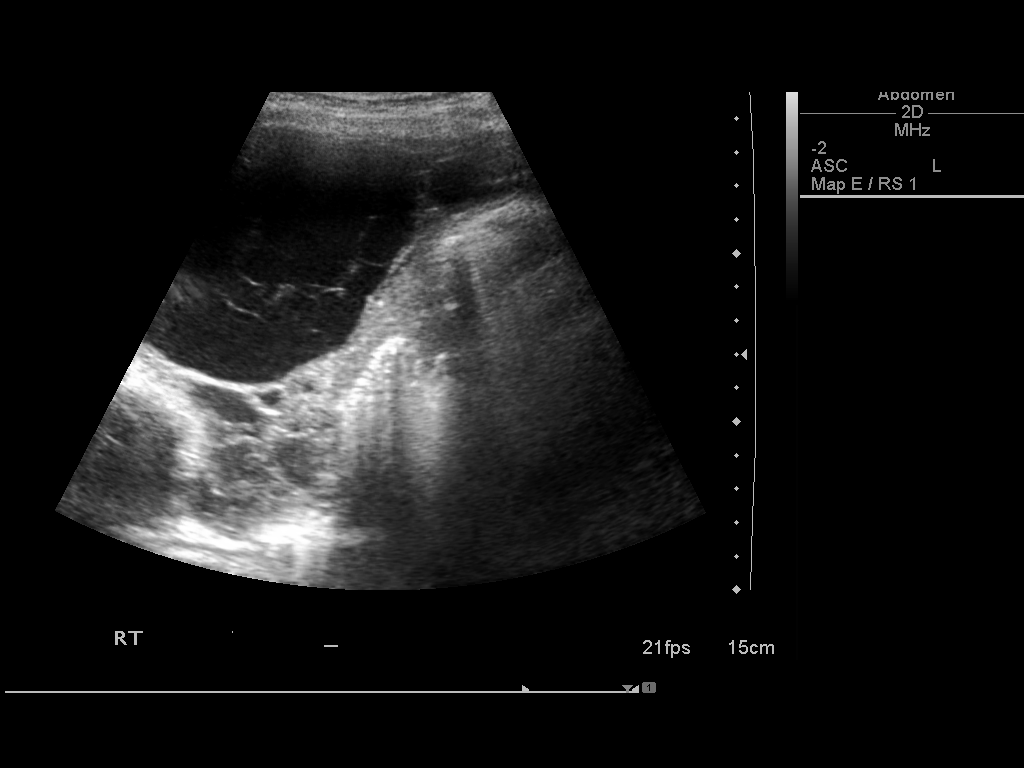
[im 5/7]
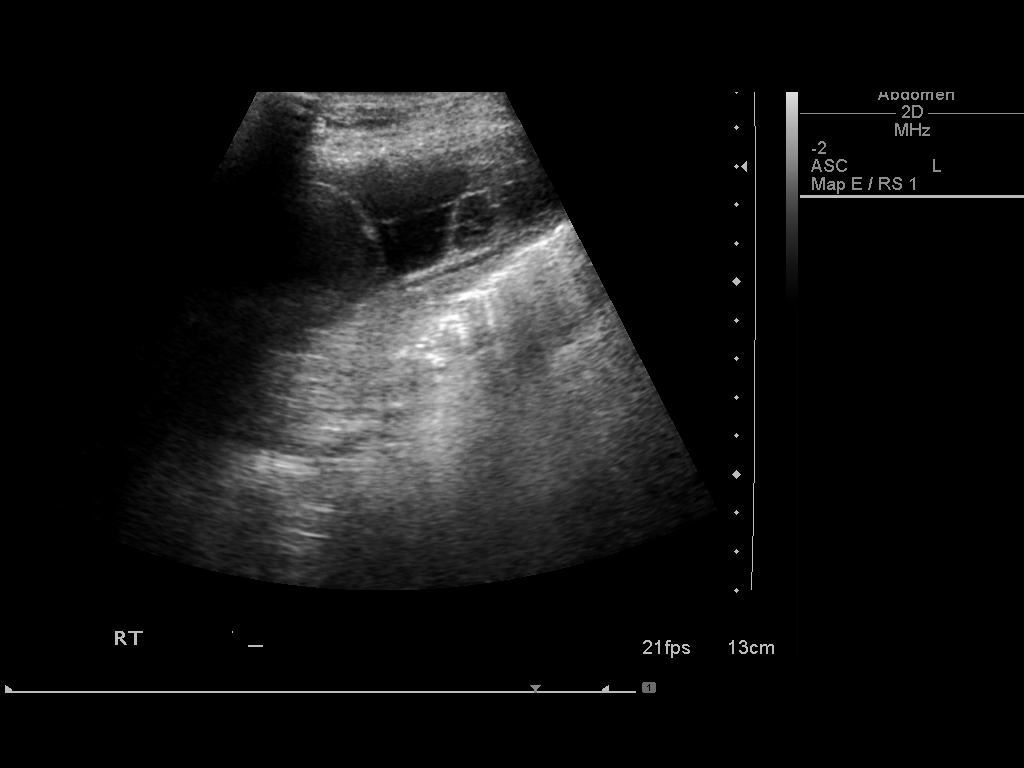
[im 6/7]
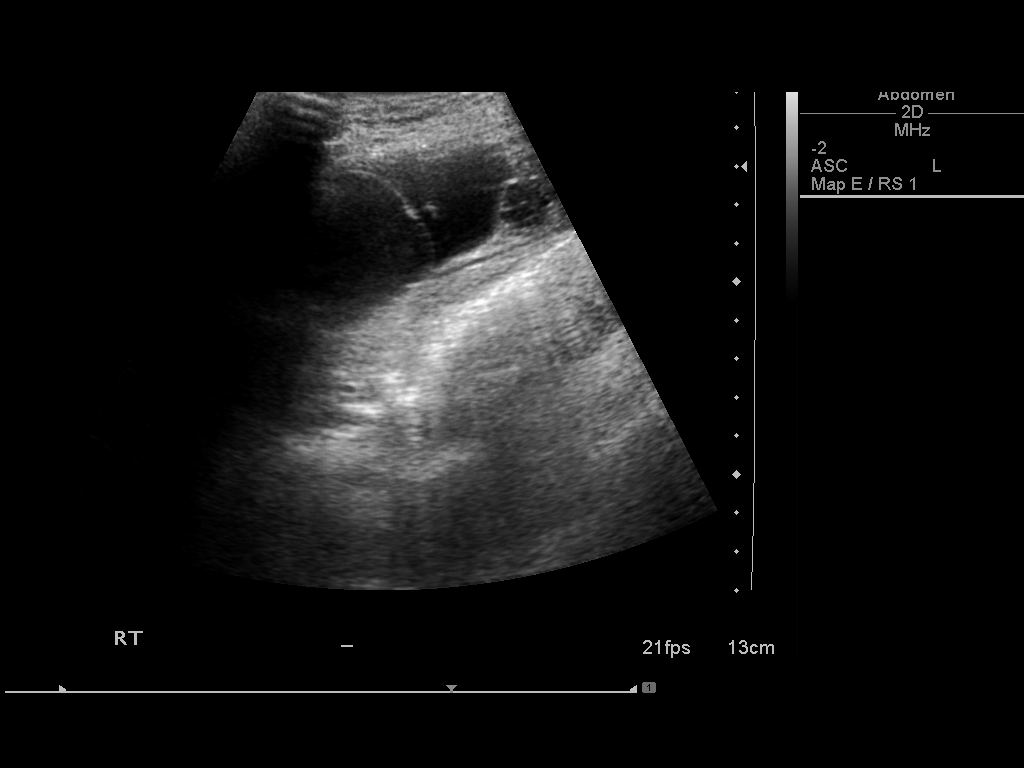
[im 7/7]
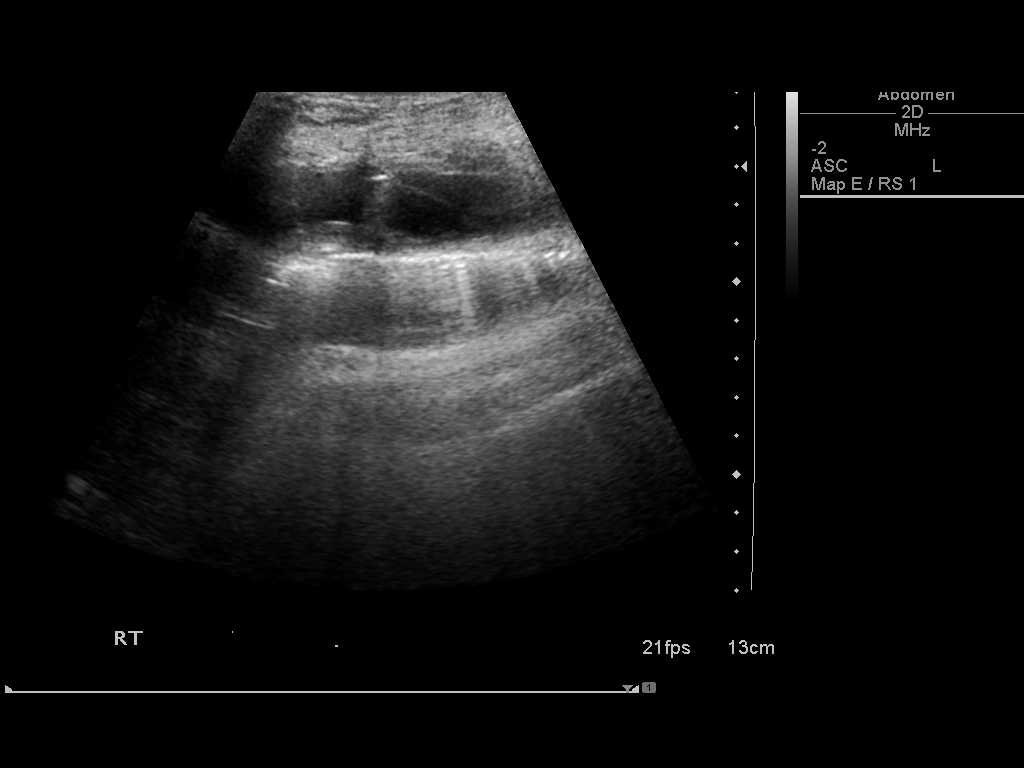

[7 of 7 positions shown; findings below may reference images not displayed]

An ultrasound guided thoracentesis was thoroughly discussed with
the patient and questions answered.  The benefits, risks,
alternatives and complications were also discussed.  The patient
understands and wishes to proceed with the procedure.  Written
consent was obtained.

Ultrasound was performed to localize and mark an adequate pocket of
fluid in the right chest.  The effusion was multiloculated with
several septations and pockets. The largest of these was targeted.
The area was then prepped and draped in the normal sterile fashion.
1% Lidocaine was used for local anesthesia.  Under ultrasound
guidance a 19 gauge Yueh catheter was introduced.  Thoracentesis
was performed.  The catheter was removed and a dressing applied.

Complications:  None
FINDINGS: A total of approximately 340 ml of turbid, blood tinged
fluid was removed. A fluid sample was sent for laboratory analysis.
IMPRESSION: Successful ultrasound guided right thoracentesis yielding 340 ml of
pleural fluid.
Loculated right sided pleural effusion.

Caleb Aujla

## 2012-09-22 IMAGING — CR DG CHEST 1V
1 series · 1 of 1 positions shown · non-contrast
Comparison: 03/14/2012

CLINICAL DATA: Post right thoracentesis

CHEST - 1 VIEW

[view not recorded]
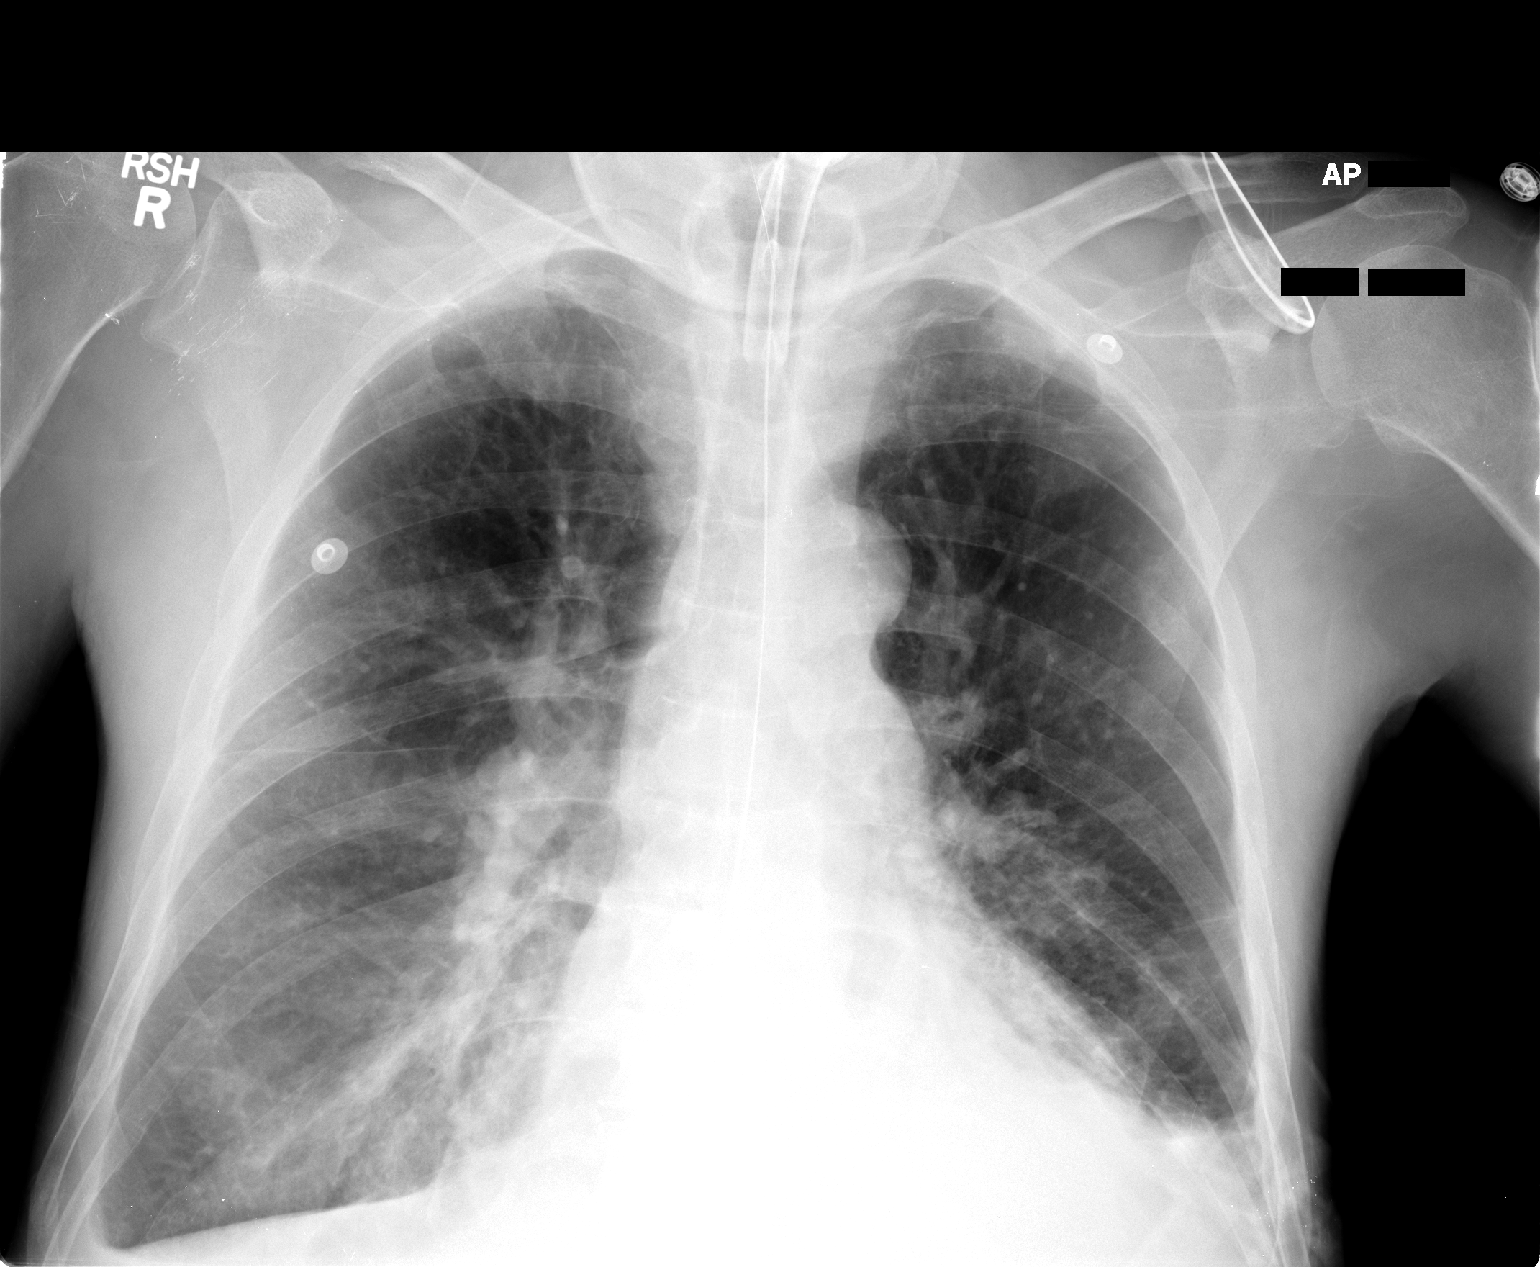

[1 of 1 positions shown; findings below may reference images not displayed]

FINDINGS: Cardiomediastinal silhouette is stable.  Stable NG tube
position.  Stable tracheostomy tube position.  Decrease in the
right pleural effusion.  Stable small left pleural effusion with
left basilar atelectasis or infiltrate.  No diagnostic
pneumothorax.
IMPRESSION: Decrease in the right pleural effusion.  Stable small left pleural
effusion with left basilar atelectasis or infiltrate.  No
diagnostic pneumothorax.] Old left rib fractures are stable.

## 2012-09-23 IMAGING — CR DG CHEST 1V PORT
1 series · 1 of 1 positions shown · non-contrast
Comparison: 03/17/2012

CLINICAL DATA: Tracheostomy evaluation

PORTABLE CHEST - 1 VIEW

[AP]
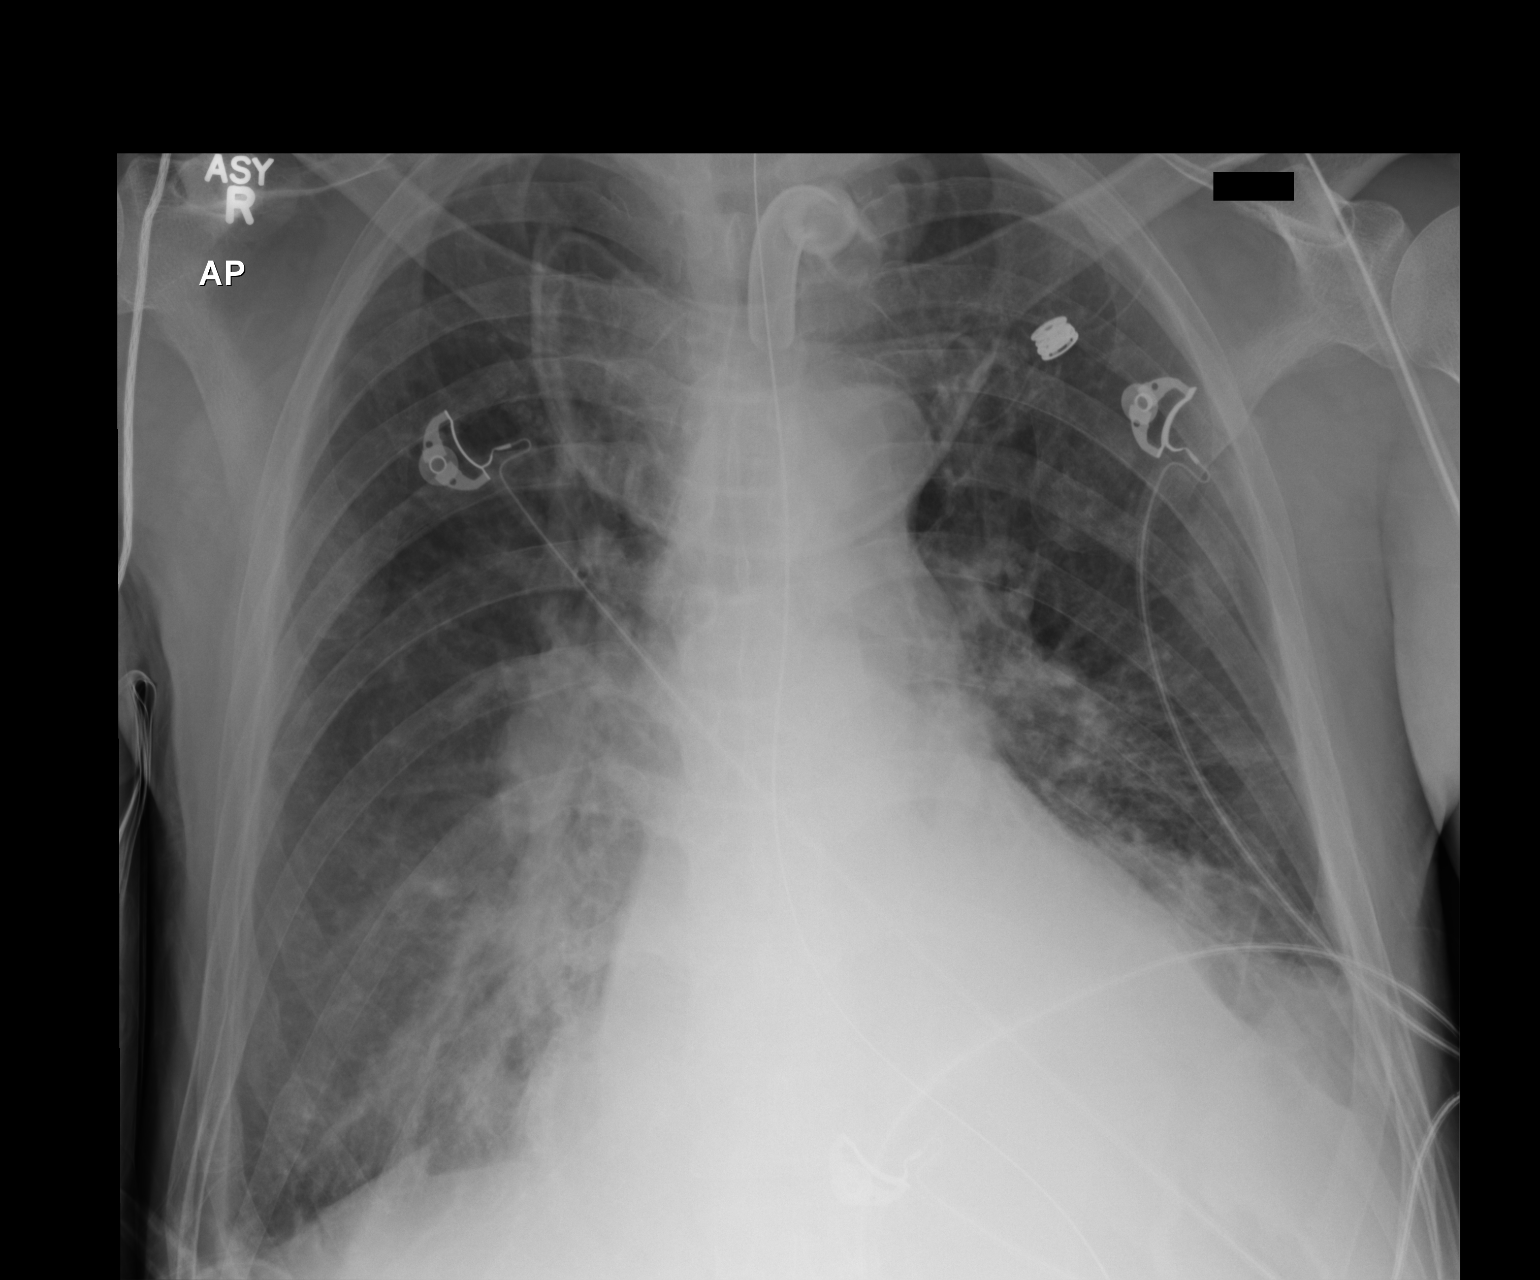

[1 of 1 positions shown; findings below may reference images not displayed]

FINDINGS: Cardiomegaly again noted.  Tracheostomy tube is unchanged
in position.  Central mild vascular congestion without convincing
pulmonary edema.  Stable small left pleural effusion with left
basilar atelectasis or infiltrate.  Trace right pleural effusion
with right basilar atelectasis. Stable NG tube position.
IMPRESSION: Tracheostomy tube is unchanged in position.  Central mild vascular
congestion without convincing pulmonary edema.  Stable small left
pleural effusion with left basilar atelectasis or infiltrate.
Trace right pleural effusion with right basilar atelectasis.

## 2012-09-26 IMAGING — CR DG CHEST 1V PORT
1 series · 1 of 1 positions shown · non-contrast
Comparison: 03/18/2012

CLINICAL DATA: Fever.

PORTABLE CHEST - 1 VIEW

[AP]
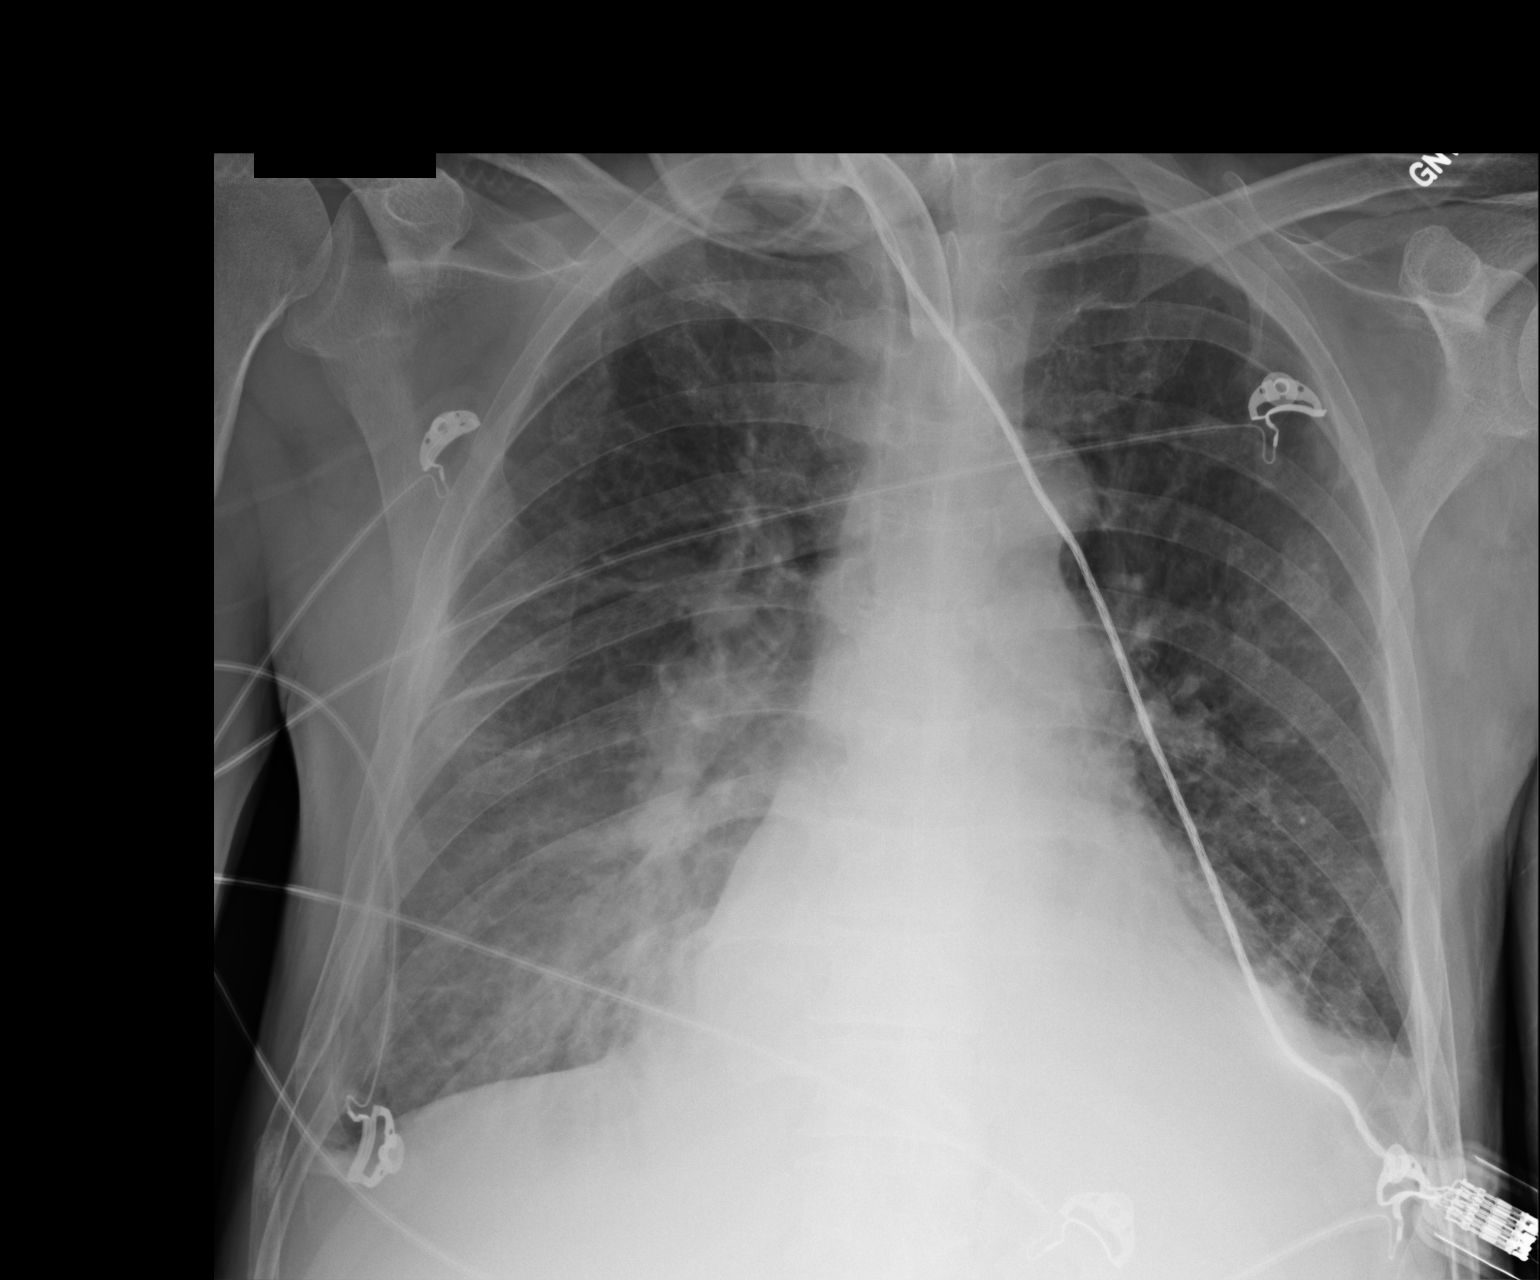

[1 of 1 positions shown; findings below may reference images not displayed]

FINDINGS: Tracheostomy tube is unchanged in position.  Enteric tube
has been removed.  Mild cardiac enlargement with normal pulmonary
vascularity, similar to previous study.  There is blunting of the
costophrenic angles suggesting small effusions.  Emphysematous
changes in the lungs.  Developing increased density in the right
lung base may represent early pneumonia.  No pneumothorax.
Mediastinal contours appear intact. Healing right rib fracture.
IMPRESSION: Cardiac enlargement with small pleural effusions, stable.
Suggestion of increasing opacity in the right lung base which may
represent early pneumonia.

## 2012-09-29 IMAGING — CR DG CHEST 1V PORT
1 series · 1 of 1 positions shown · non-contrast
Comparison: 03/21/2012

CLINICAL DATA: Tracheostomy, pneumonia

PORTABLE CHEST - 1 VIEW

[AP]
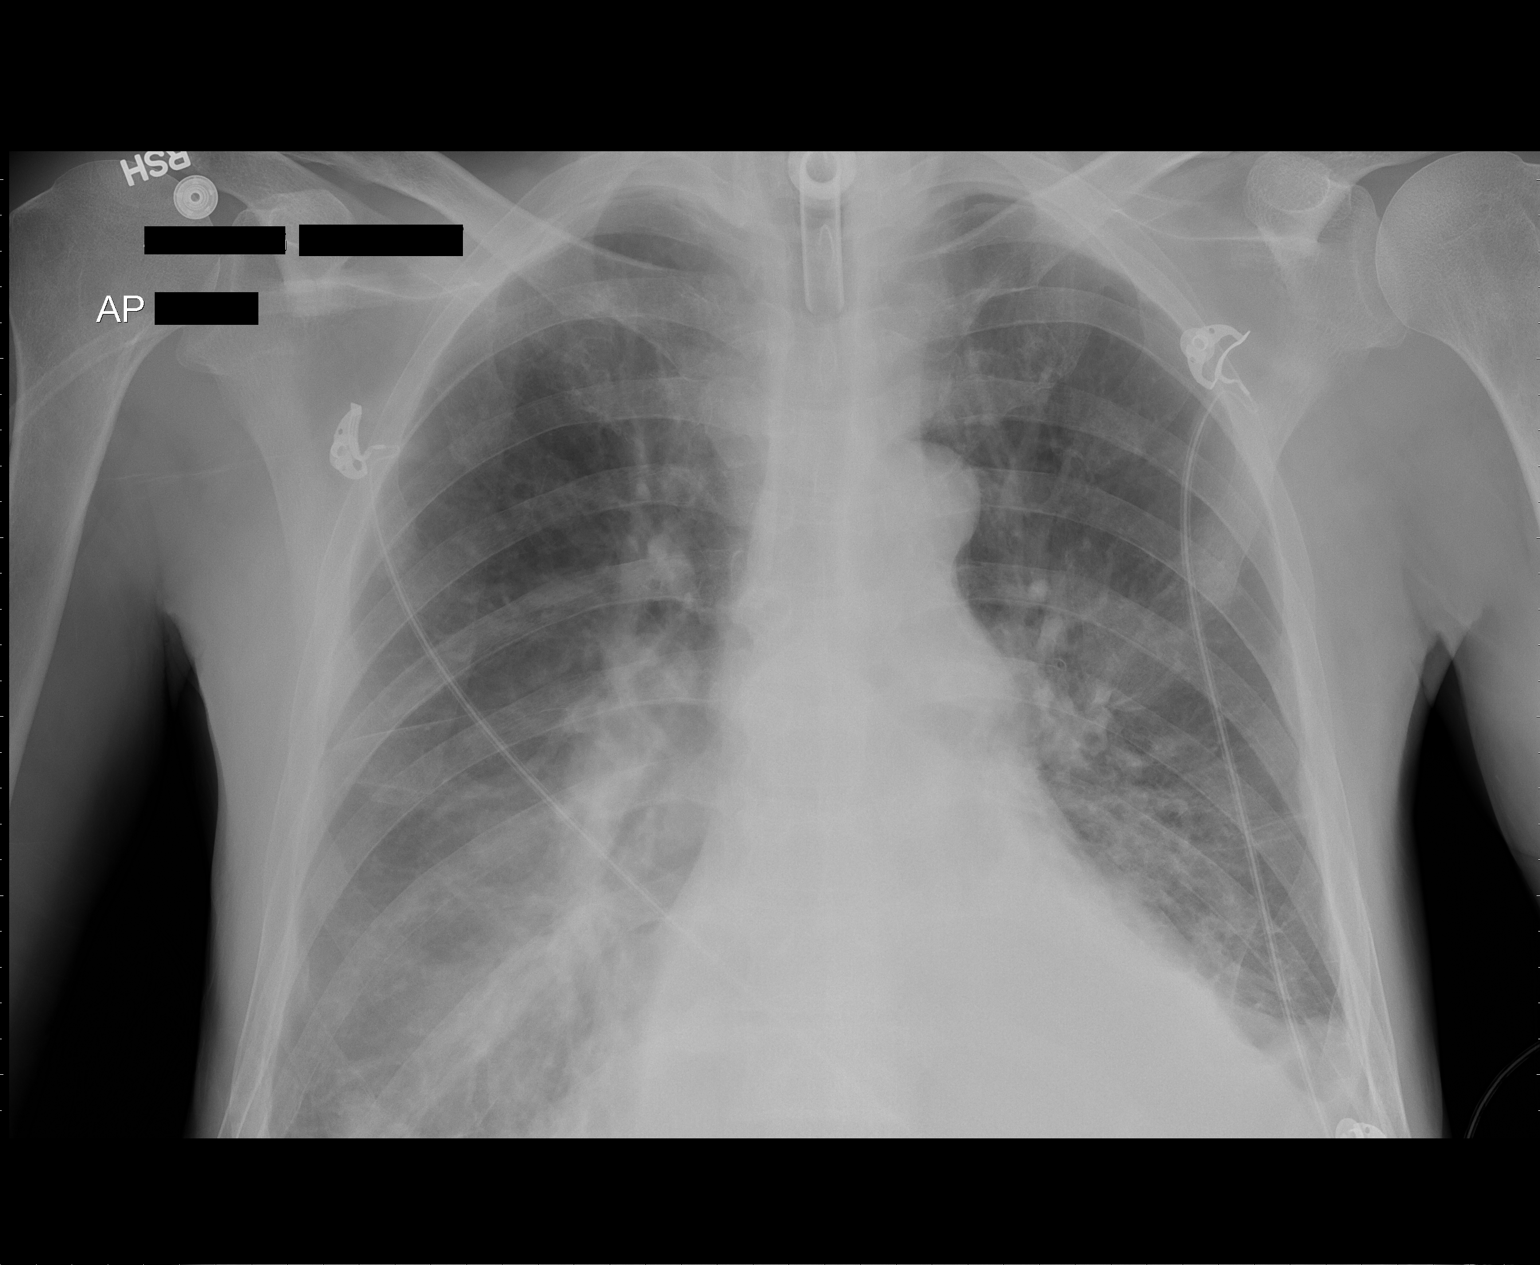

[1 of 1 positions shown; findings below may reference images not displayed]

FINDINGS: Stable tracheostomy position.  Mild cardiomegaly persist
with central vascular congestion.  Patchy bibasilar airspace
disease persist with left lower lobe dense consolidation.  Small
effusions not excluded.  No significant interval change.  No
pneumothorax.
IMPRESSION: Stable portable chest exam

## 2012-10-02 IMAGING — CR DG CHEST 1V PORT
1 series · 1 of 1 positions shown · non-contrast
Comparison: 03/24/2012

CLINICAL DATA: Chest pain

PORTABLE CHEST - 1 VIEW

[AP]
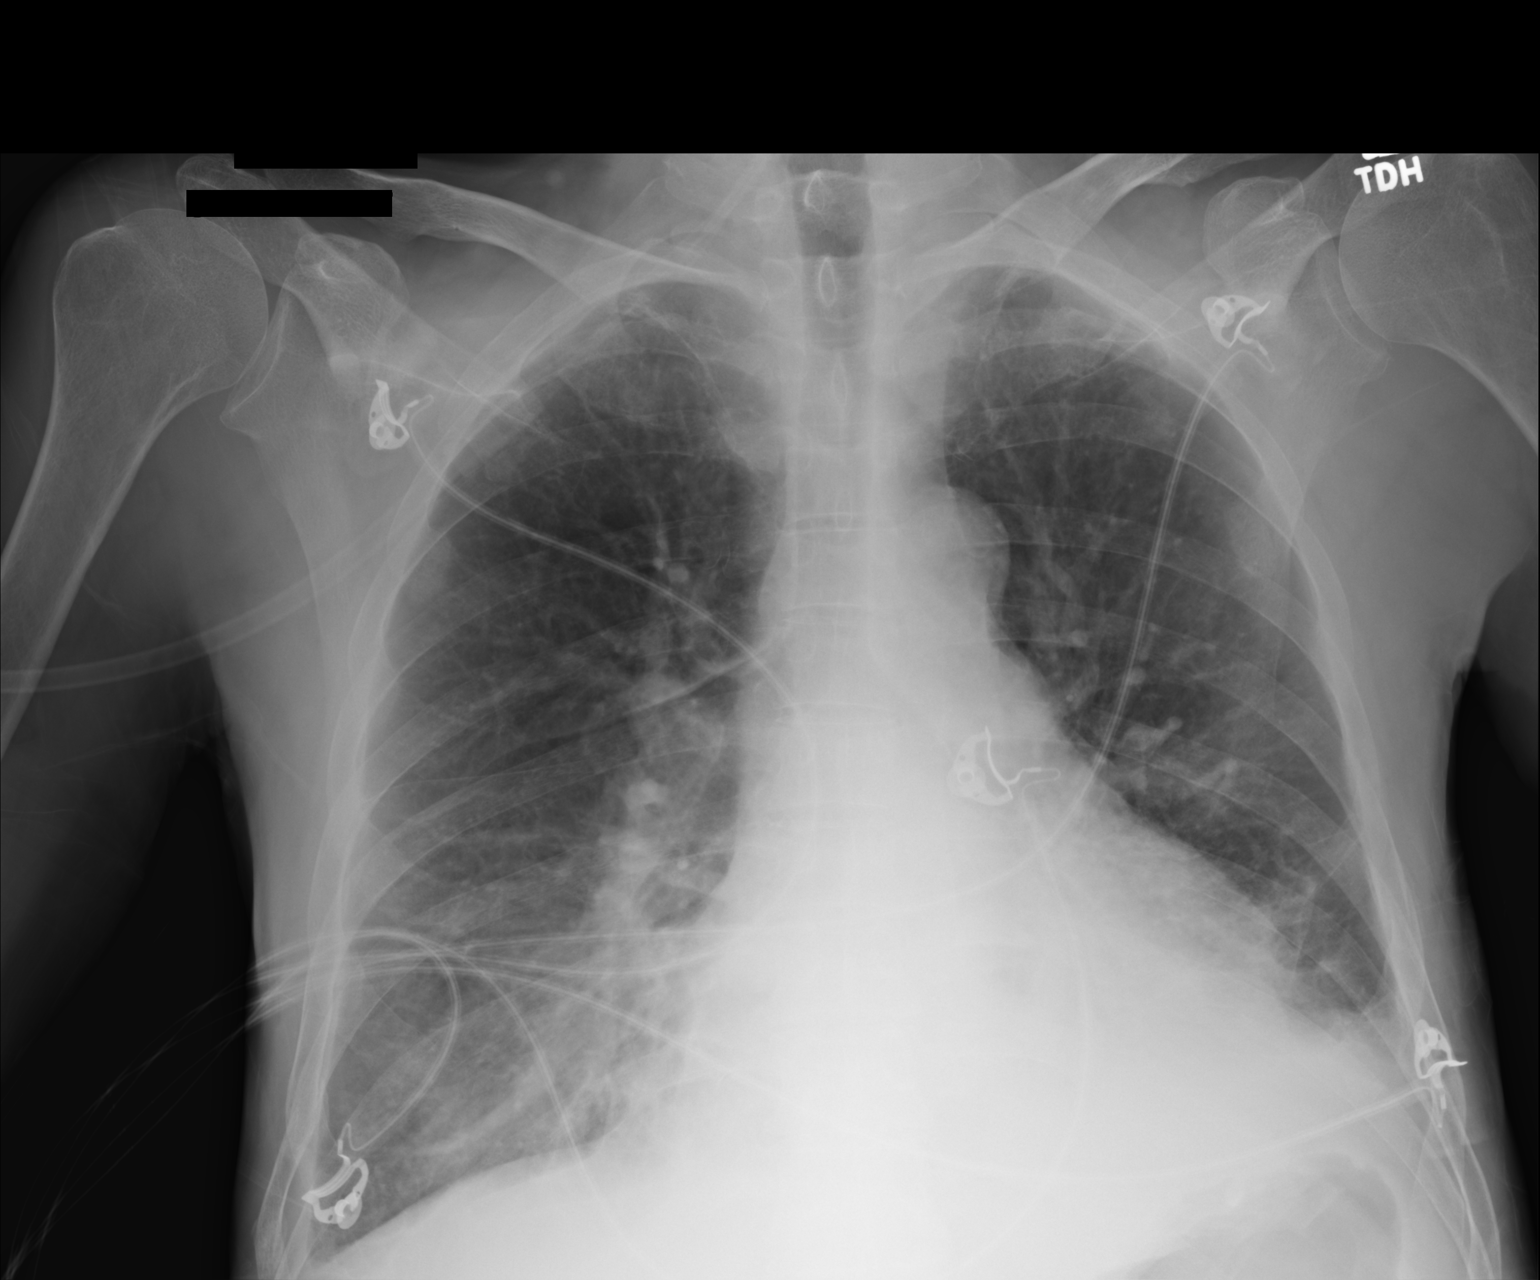

[1 of 1 positions shown; findings below may reference images not displayed]

FINDINGS: Heart size upper normal.  Central vascular / hilar
fullness is similar to prior.  Bibasilar opacities.  Small left
pleural effusion not excluded.  Interval removal of the
tracheostomy tube.  No pneumothorax.  No acute osseous finding.
IMPRESSION: Bibasilar opacities are nonspecific however similar to prior.
Differential includes atelectasis, aspiration, or infiltrate.]

## 2012-10-05 IMAGING — CR DG CHEST 1V PORT
1 series · 1 of 1 positions shown · non-contrast
Comparison: 03/27/2012 and earlier.

CLINICAL DATA: 56-year-old male with pneumonia.

PORTABLE CHEST - 1 VIEW

[AP]
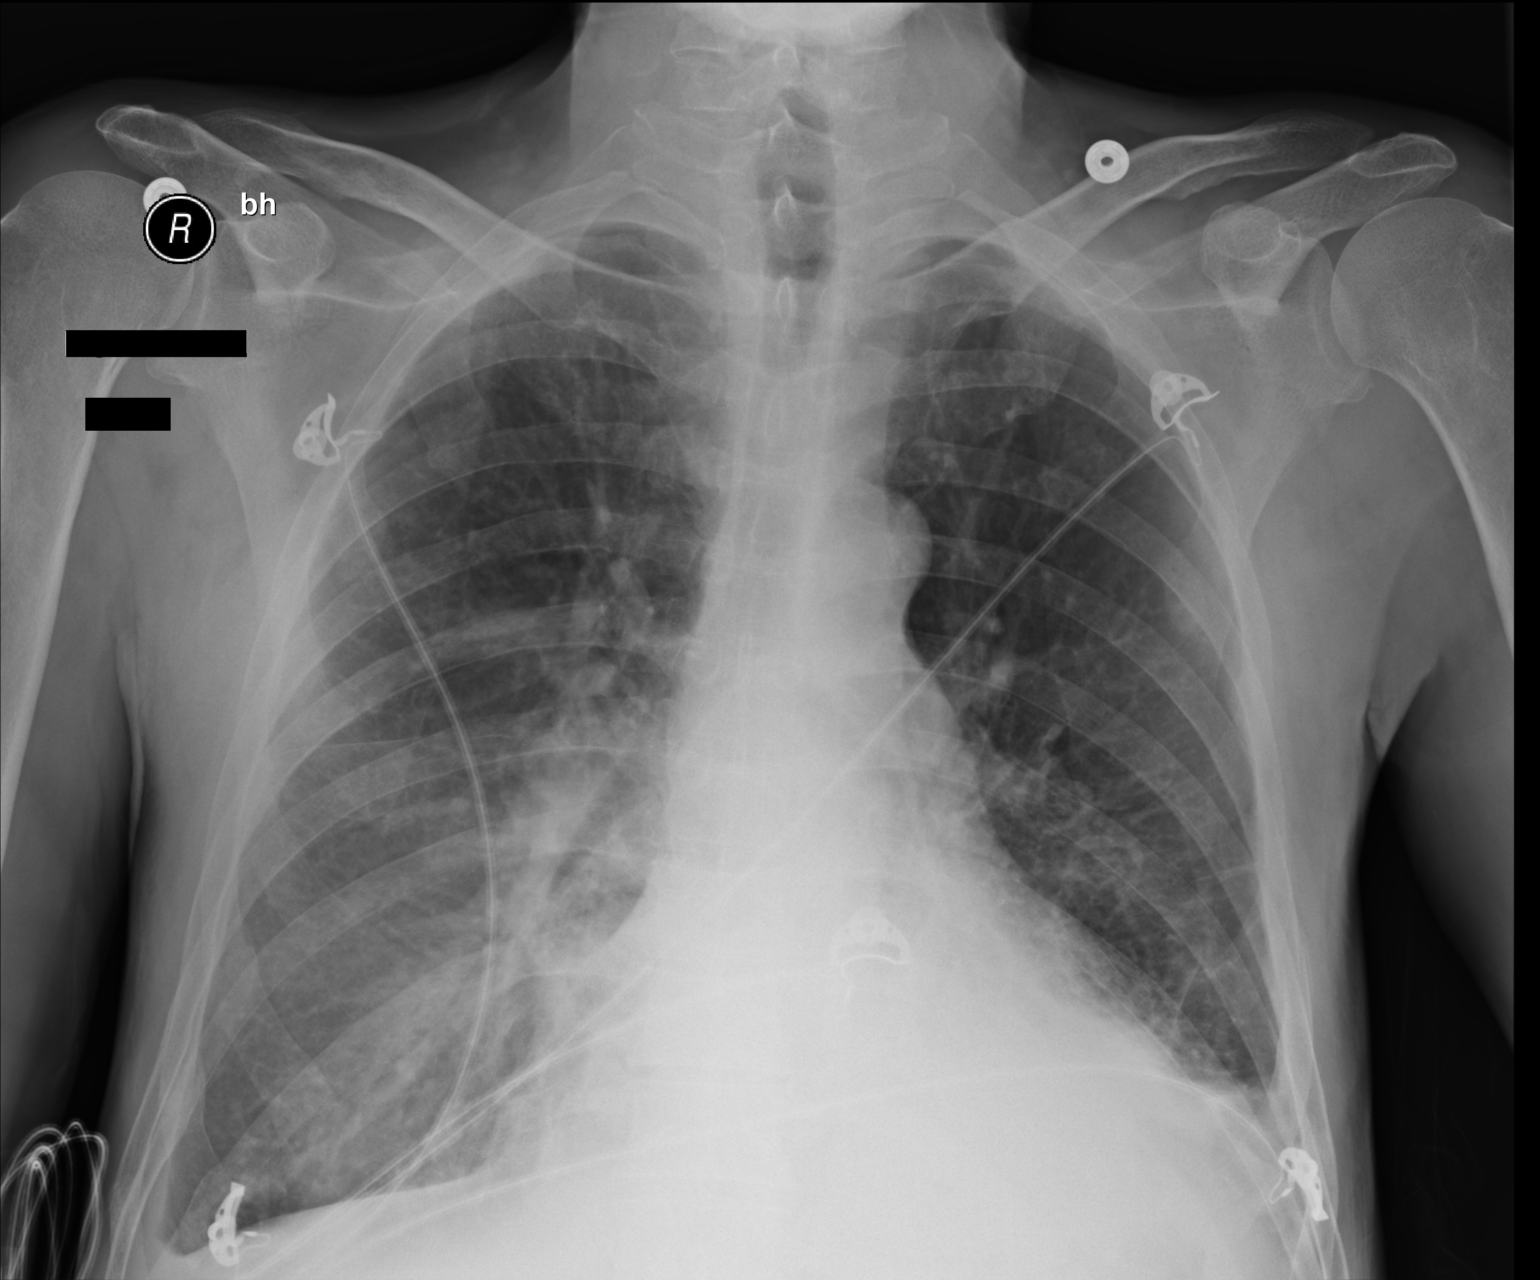

[1 of 1 positions shown; findings below may reference images not displayed]

FINDINGS: Portable semi upright AP view at 9805 hours.  Mildly
increased retrocardiac opacity.  Patchy right infrahilar opacity is
stable. Scattered subtle bilateral upper lobe nodular opacity is
new.  No pneumothorax.  No large effusion.  Stable cardiac size and
mediastinal contours.
IMPRESSION: Mild progression of bilateral pneumonia, with new bilateral nodular
upper lobe opacity.  Slightly worsened ventilation at the left lung
base.

## 2012-10-08 IMAGING — CR DG CHEST 1V PORT
1 series · 1 of 1 positions shown · non-contrast
Comparison: 03/30/2012

CLINICAL DATA: Chest pain with shortness of breath

PORTABLE CHEST - 1 VIEW

[AP]
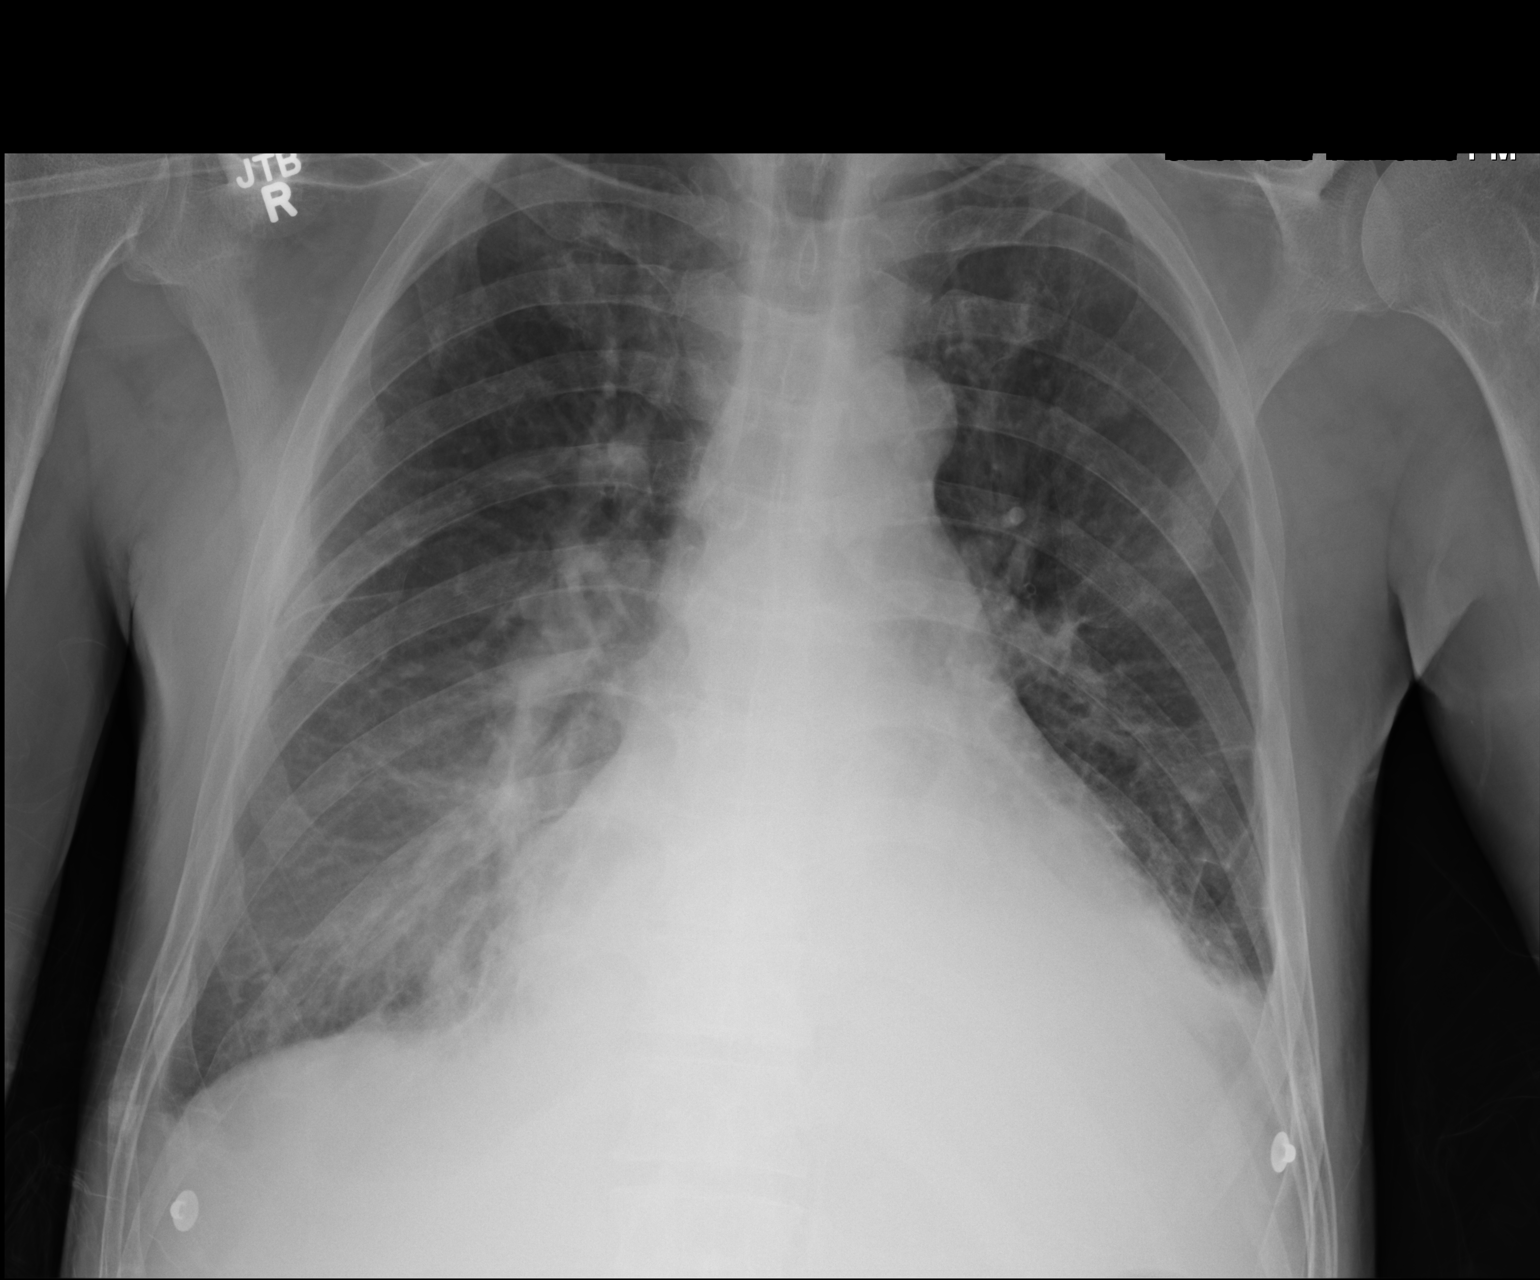

[1 of 1 positions shown; findings below may reference images not displayed]

FINDINGS: Cardiomegaly is again noted and is stable in comparison
with the prior exam.  Persistent increased density at both lung
bases left greater than right is noted and appears unchanged
compatible with stable bilateral lower lobe pneumonias.  No new
areas of focal infiltrate are seen.  No definite pleural fluid is
noted.

Dense nodularity overlying the left upper lung zone appears to
correlate with the anterior aspect of the left third rib.
IMPRESSION: Stable bilateral lower lobe infiltrates compatible with known areas
of pneumonia.

## 2012-10-29 IMAGING — US IR DECLOT *R*
1 series · 2 of 2 positions shown · non-contrast
Comparison: none

Clinical Data/Indication: Thrombosed AVG. Left upper extremity.

DIALYSIS GRAFT THROMBECTOMY, ULTRASOUND VENOUS ACCESS, PTA VENOUS
Sedation: Versed 2.00 mg, Fentanyl 100 mcg.
Total Moderate Sedation Time: 45 minutes.
Contrast Volume: 100 ml [id number]-722.
Additional Medications: Heparin 2777 units.  TPA 2 mg..
Fluoroscopy Time: [DATE] minutes.
Procedure: The procedure, risks, benefits, and alternatives were
explained to the patient. Questions regarding the procedure were
encouraged and answered. The patient understands and consents to
the procedure.
The left lower was prepped with betadine in a sterile fashion, and
a sterile drape was applied covering the operative field. A sterile
gown and sterile gloves were used for the procedure.
The graft was noted to be occluded  initially with ultrasound.
Under sonographic guidance, a micropuncture needle was inserted
into the graft (Ultrasound image documentation was performed)
directed towards the venous anastomosis.  It was removed over a 018
wire.  A 5-French transitional dilator was inserted.  1 mg t-PA was
injected.  The dilator was exchanged over a Benson wire for a 6-
French sheath.  The identical procedure was performed towards the
arterial anastomosis.
The Kumpe catheter was utilized to advance the Kumpe catheter
across both the arterial and venous anastomoses.  Central
venography was performed.
A six mm angioplasty device was utilized to dilate the venous limb
of the graft, the venous anastomosis.  The Fogarty balloon was then
utilized to sweep the arterial and venous limbs of the graft.
Repeat imaging was performed.  This demonstrates minimal amount of
thrombus within the pseudoaneurysm in the graft as well as a small
amount of thrombus in the outflow vein in the left axilla.
Persistent narrowing at the venous anastomosis is also present.
 An Angiojet device was utilized to Don Lolito the thrombus in the graft
pseudoaneurysm as well as in the left axillary vein for 60 seconds.
Repeat imaging demonstrates patency without residual thrombus.
The venous anastomosis was then dilated to 7 mm.
Final imaging was performed.  Sheaths were removed and hemostasis
was achieved with two figure-of-eight O Prolene stitches.

[Series 1: ir declot *right* · 2 of 2 slices shown]
[im 1/2]
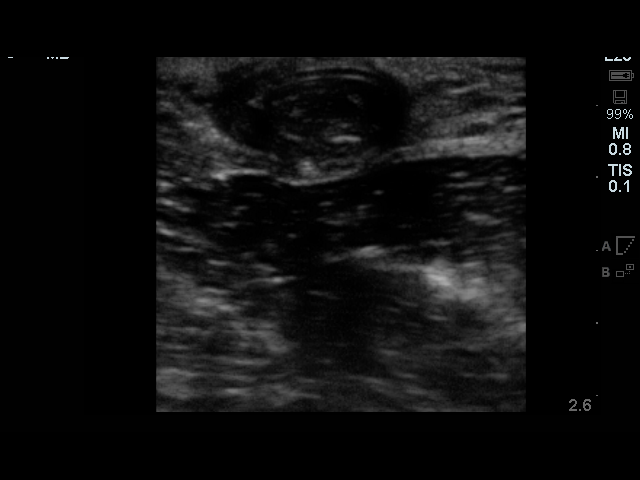
[im 2/2]
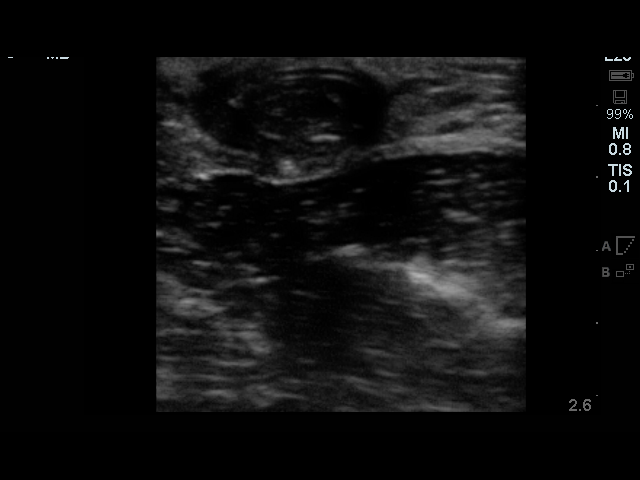

[2 of 2 positions shown; findings below may reference images not displayed]

FINDINGS: Central venography demonstrates wide patency..

Post thrombectomy imaging demonstrates patency of the arterial
anastomosis and distal graft.  There is residual thrombus within a
pseudoaneurysm in the upper portion of the graft.  There is also
some thrombus in the axillary vein.  Residual narrowing at the
venous anastomosis.

After lysis with the Angiojet device, the thrombus in the graft and
axillary vein resolved.

After dilatation to 7 mm, the venous anastomosis was noted to be
widely patent.

Complications: [None]
IMPRESSION: Successful left arm AV graft thrombectomy and dilatation of the
venous anastomosis to 7 mm.

## 2012-11-07 IMAGING — US IR DECLOT *L*
1 series · 3 of 3 positions shown · non-contrast
Comparison: none

INSERT IR DECLOT LEFT MODERATE SEDATION; INSERT IR PTA VENOUS LEFT

Date: 05/02/2012
CLINICAL HISTORY: 56-year-old male with end-stage renal disease on
hemodialysis [REDACTED], [REDACTED], [REDACTED] via a left upper extremity
brachial artery to axillary vein straight arteriovenous graft.  His
first thrombotic episode was earlier this month on 04/23/2012 and
he underwent a successful declot procedure and angioplasty of
stenosis at the venous anastomosis at that time.  He presents
today, 10 days later with recurrent clotting of his arteriovenous
graft.  He reports that at dialysis on [REDACTED] when his graft was
initially cannulated, the nurse pulled back some clots and then he
had a full dialysis session.  However he did have prolonged
bleeding at the end of the session requiring placement of a
pressure bandage. The discovered he had rethrombosed at dialysis
yesterday ([REDACTED]).

[Series 1: ir declot *left* · 3 of 3 slices shown]
[im 1/3]
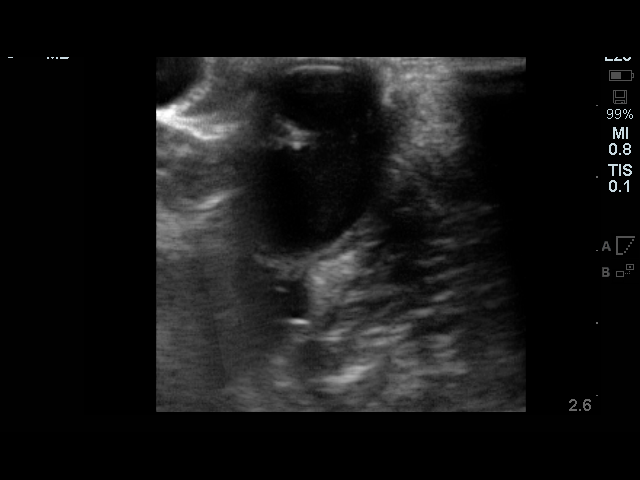
[im 2/3]
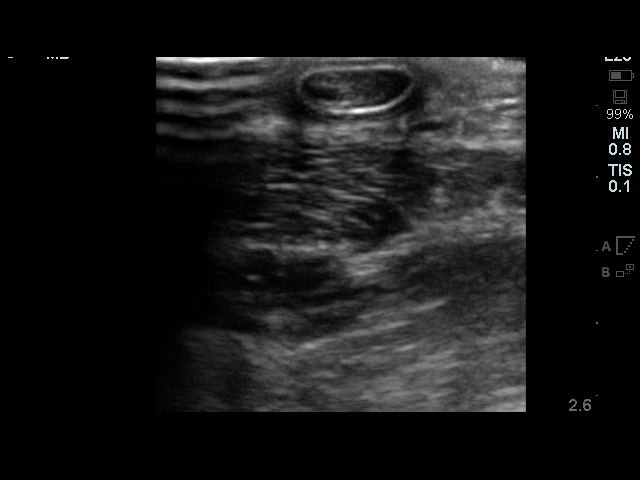
[im 3/3]
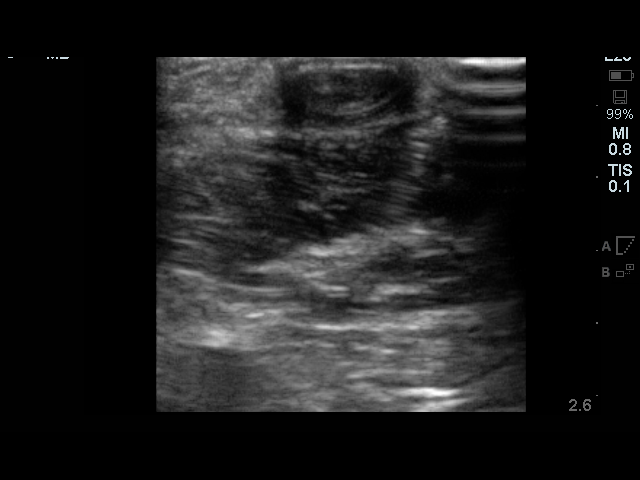

[3 of 3 positions shown; findings below may reference images not displayed]

Procedures Performed:
1. Ultrasound-guided antegrade puncture of the arteriovenous graft
adjacent to the arterial anastomosis
2.  Ultrasound-guided retrograde puncture of the arteriovenous
graft directed toward the arterial anastomosis
3.  Cannulation of the left subclavian vein
4.  Central and pullback left upper extremity venography
5.  Declot procedure using a combination of TPA thrombolysis,
Michirain mechanical, balloon maceration and suction thrombectomy
6.  Balloon angioplasty of recurrent stenosis at the venous
anastomosis
7.  Completion shuntogram

Sedation: Moderate (conscious) sedation was used.  One mg Versed,
50 mcg Fentanyl were administered intravenously.  The patient's
vital signs were monitored continuously by radiology nursing
throughout the procedure.

Sedation Time: 48 minutes

Fluoroscopy time: 14.1 minutes

Contrast volume: 50 ml Rmnipaque-KDD administered intravenously

PROCEDURE/FINDINGS:

 Informed consent was obtained from the patient following
explanation of the procedure, risks, benefits and alternatives.
The patient understands, agrees and consents for the procedure.
All questions were addressed. A time out was performed.

Maximal barrier sterile technique utilized including caps, mask,
sterile gowns, sterile gloves, large sterile drape, hand hygiene,
and betadine skin prep.

The patient's left upper extremity straight arteriovenous graft was
interrogated with ultrasound.  The arterial anastomosis is widely
patent.  Beginning 1 cm beyond the arterial anastomosis the
straight graft is completely thrombosed.  Local anesthesia was
obtained in two locations with infiltration of 1% lidocaine.  Using
direct ultrasound guidance, the graft was accessed in antegrade
fashion adjacent to the arterial anastomosis.  A 5-French
transitional sheath was advanced over a 0.018-inch guide wire.  A
second ultrasound-guided access was obtained more centrally and in
retrograde fashion directed toward the arterial anastomosis.  A 5-
French transitional sheath was placed over a micro wire.  A total 4
mg of TPA was then divided between the two 5-French sheaths and
allowed to dwell.

The antegrade sheath was then exchanged over a Bentson wire for a 6-
French vascular sheath.  A Kumpe catheter was used to select the
left subclavian vein and a central venogram was performed.  There
is no evidence of subclavian, brachiocephalic or superior vena cava
stenosis.  A pullback left upper extremity venogram was then
performed.  There is a possible stenosis versus prominent valve in
the axillary vein at the level of the humeral head.  There is
recurrent narrowing at the venous anastomosis.  The graft is full
of thrombus.  The catheter was then removed.  The thrombus within
the graft was then macerated with the Michirain device.

The retrograde transitional sheath was then exchanged over a Benson
wire for a 6-French vascular sheath and the territory was used in
retrograde fashion to pull the arterial plug.  The arterial plug
was tried back into the graft and macerated with the Michirain
device.

Suction thrombectomy was then performed through both the antegrade
and retrograde sheath with successful low aeration of the moderate
amount of thrombus.  This could be bleed back through the sheath
was then encountered consistent with patency of the graft.  A
fistulogram was then performed.  The arterial anastomosis is widely
patent.  There is ab approximately 2 x 1 cm wide neck
pseudoaneurysm at the junction of the mid and proximal thirds of
the graft in the mid arm.  There is a small amount of residual
thrombus in the dome of the pseudoaneurysm.  There is some
recurrent stenosis at the venous anastomosis of approximately 60%.
The defect previously identified in the axillary vein at the level
of the humeral head has imaging appearance most consistent with
prominent valve.  The wire was then re-advanced over the venous
anastomosis and this was successfully angioplastied with a 7 x 40
mm Conquest balloon.  Repeat venography demonstrated improved
stenosis with no significant residual stenosis.  There is rapid
flow through this region.

The sheaths were then removed and hemostasis obtained with 2-0
nylon pursestring sutures.  The patient tolerated the procedure
well, there was no immediate complication..
IMPRESSION: 1.  Successful declot procedure of the left upper extremity
brachial artery to axillary vein straight arteriovenous graft.

2.  There is a 2 x 1 cm wide neck pseudoaneurysm at the junction of
the middle and proximal thirds of the graft.  This may represent a
contributing source of the patient's recent clotting episodes.
Turbulence and stasis within the pseudoaneurysm leads to local
thrombosis which can communicate directly with the main body of the
graft.  Recommend consultation with surgery for potential surgical
revision.  If the patient is not a candidate for surgical
correction, the defect can be fifth fixed endovascular with
placement of a covered stent.  The graft segment is a long enough
that successful arterial and venous cannulation could be performed
without puncturing the covered stent. These results were called by
telephone on 05/02/2012 at [DATE] a.m. to Yacdany Brenes, PA, who
verbally acknowledged these results.

3.  Successful venous angioplasty of recurrent stenosis at the
venous anastomosis.

4. Access management:  Recommendations as above.  This hemodialysis
access remains amendable to continued percutaneous intervention as
needed.

[REDACTED]

## 2012-11-16 IMAGING — RF DG ANG/EXT/UNI/OR LEFT
1 series · 9 of 9 positions shown · non-contrast
Comparison: None available

CLINICAL DATA: Balloon rupture during surgical declot of dialysis
graft

INTRAOPERATIVE ARTERIOGRAM LEFT UPPER EXTREMITY

[Series 1: run · 3 acquisitions, 9 frames shown]
[im 1/3]
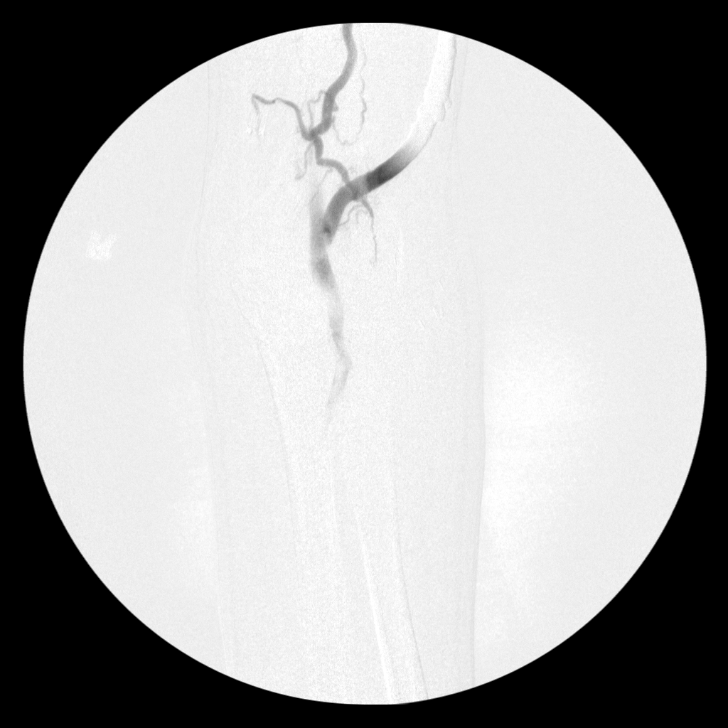
[im 1/3]
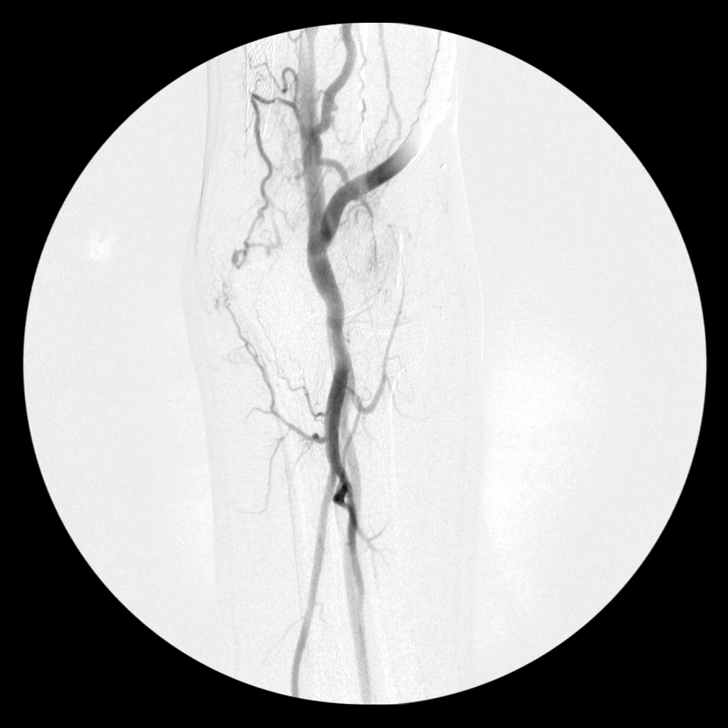
[im 1/3]
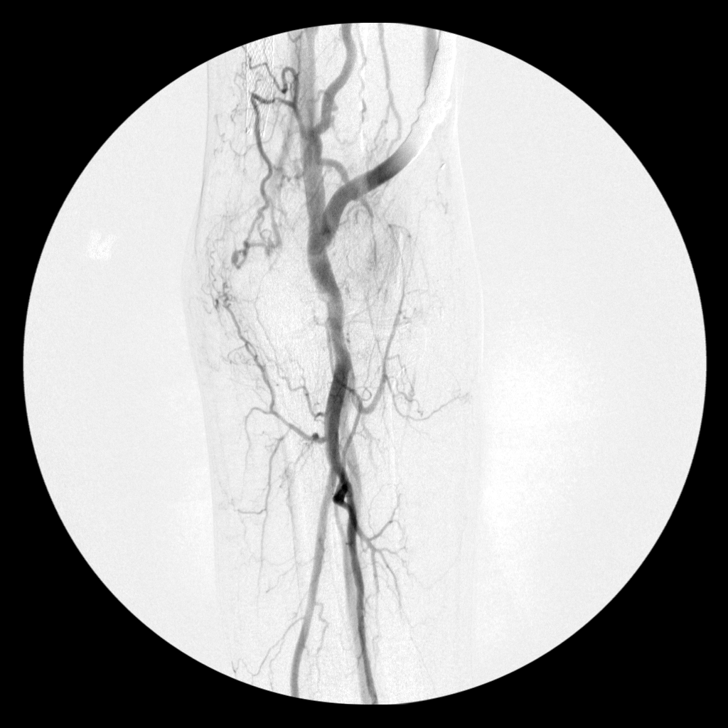
[im 1/3]
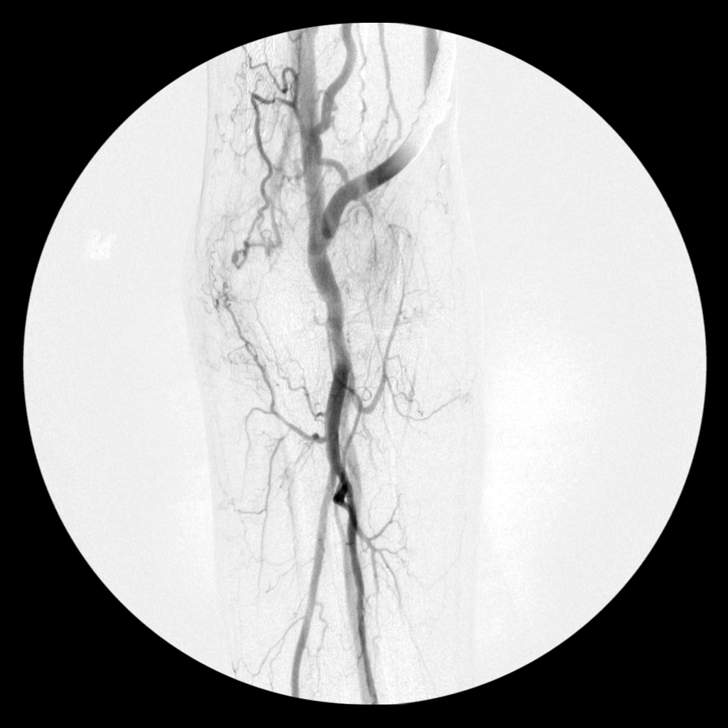
[im 2/3]
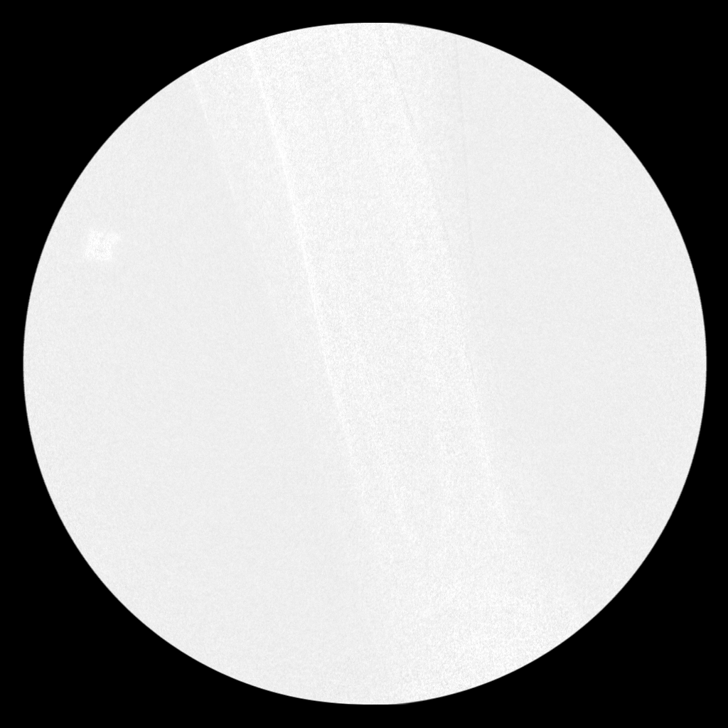
[im 2/3]
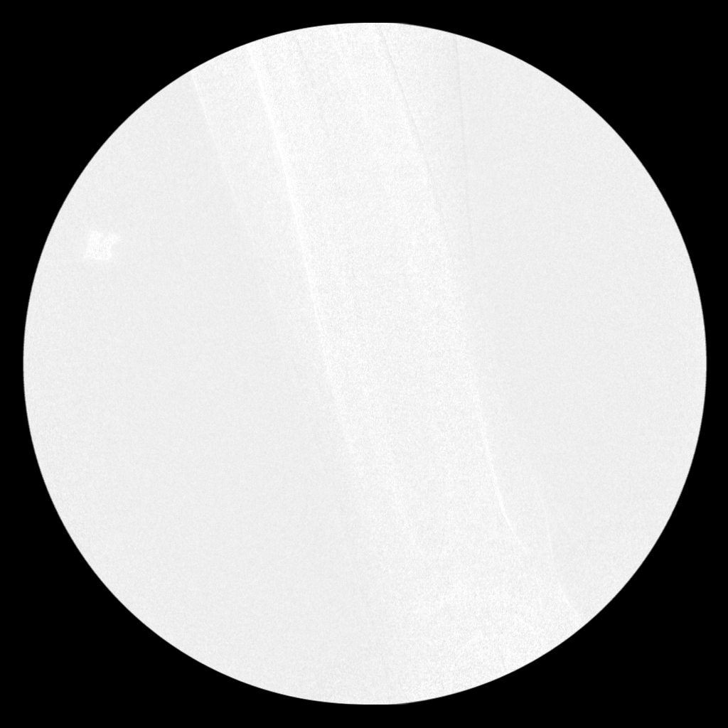
[im 2/3]
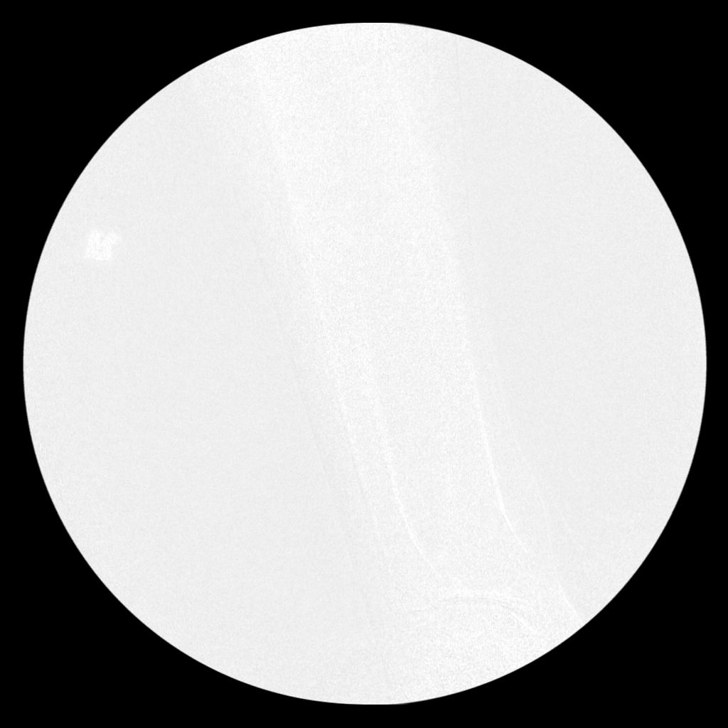
[im 2/3]
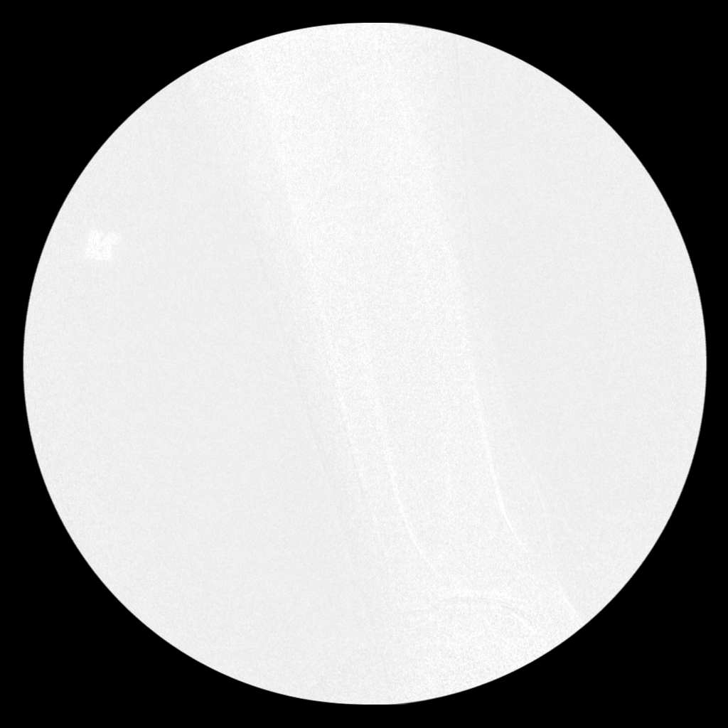
[im 3/3]
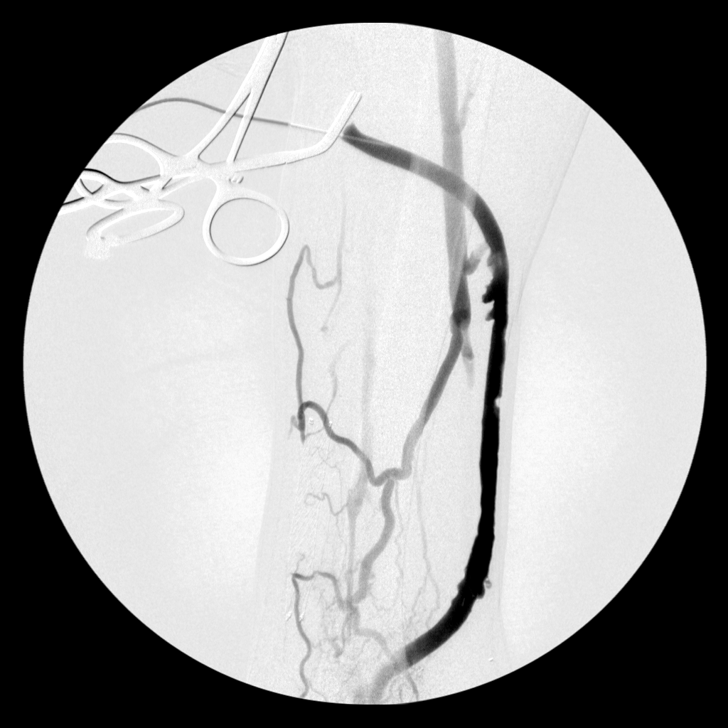

[9 of 9 positions shown; findings below may reference images not displayed]

FINDINGS: Contrast reflux from the graft demonstrates no
radiodense foreign body or intraluminal filling defect.  Radial and
ulnar arteries are patent across the wrist.  Interosseous artery
occludes in the mid forearm, with slow flow.  Venous outflow was
not evaluated.
IMPRESSION: 1.  Occlusion of the interosseous artery in the mid forearm.

## 2012-11-30 IMAGING — CR DG CHEST 1V PORT
1 series · 1 of 1 positions shown · non-contrast
Comparison: 04/02/2012

CLINICAL DATA: Dialysis catheter placement.

PORTABLE CHEST - 1 VIEW

[AP]
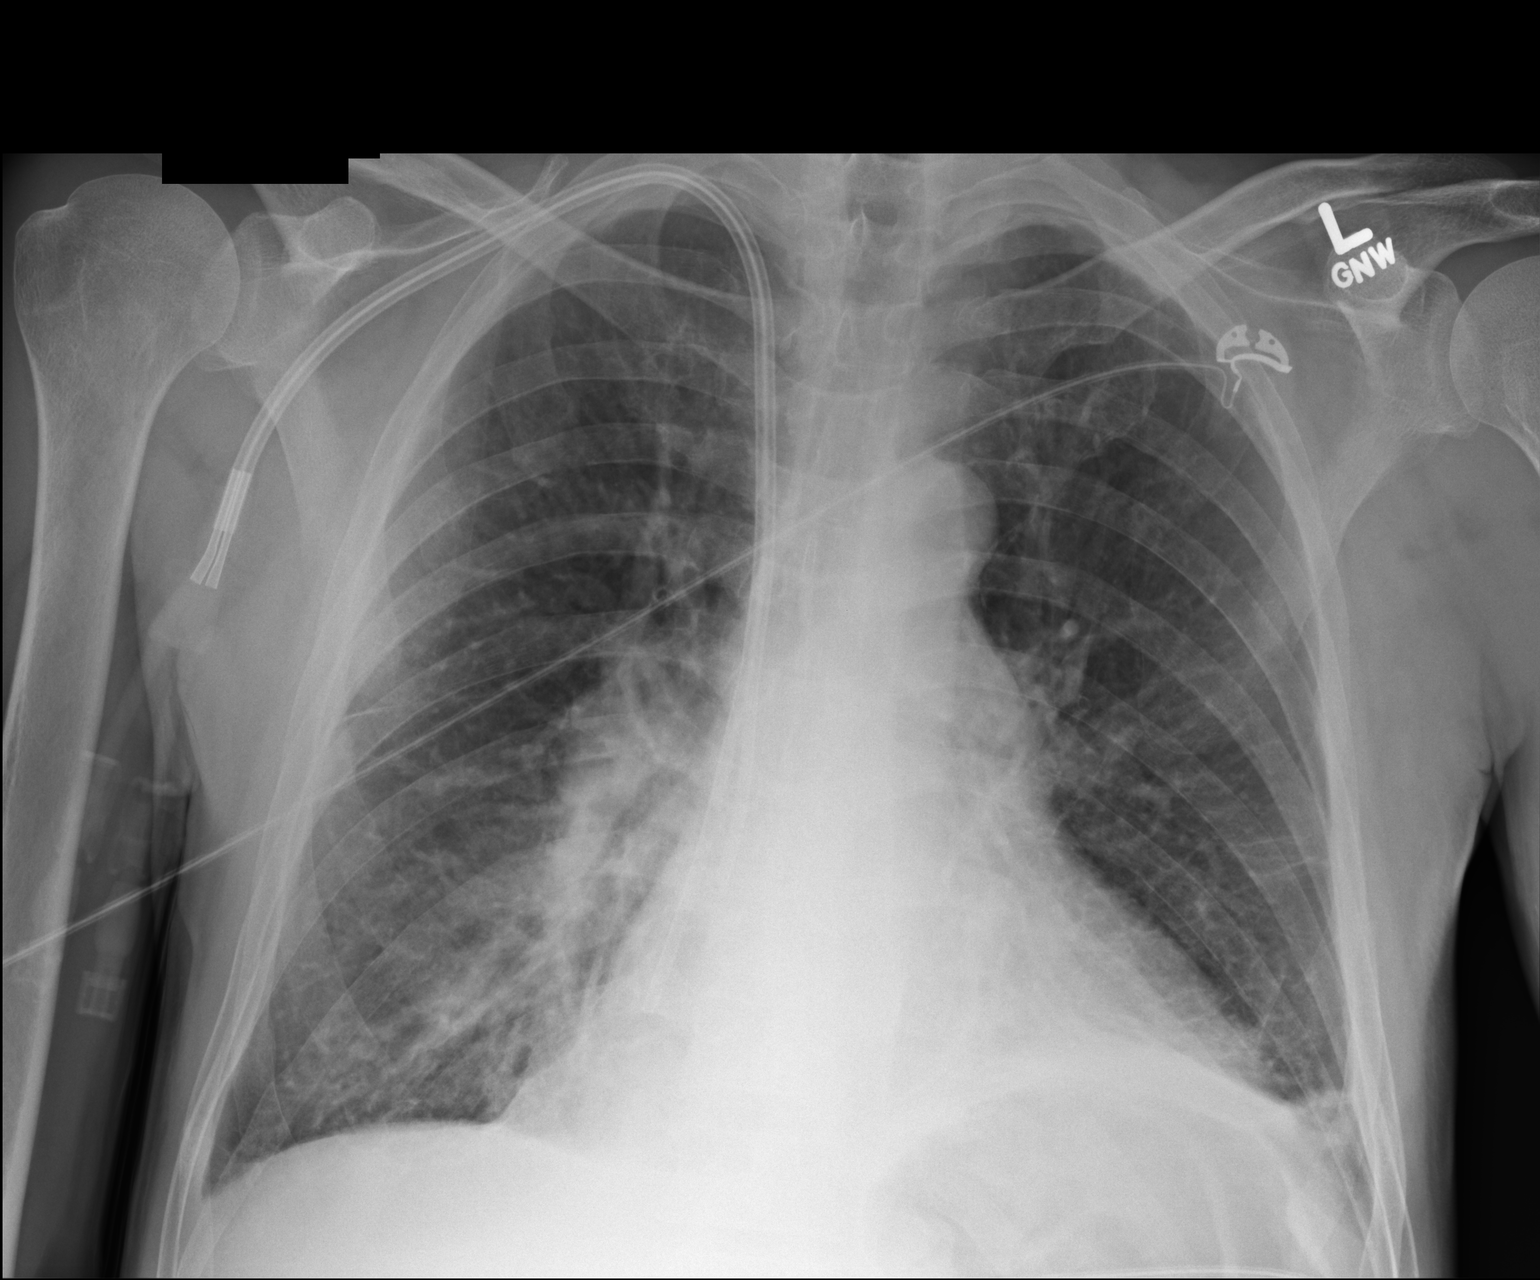

[1 of 1 positions shown; findings below may reference images not displayed]

FINDINGS: Right-sided dialysis catheter has been placed with the
tips in the upper right atrium.  No pneumothorax present.  The
lungs show chronic scarring/atelectasis at both bases.  No overt
pulmonary edema identified.  Stable cardiomegaly.
IMPRESSION: Appropriate positioning of dialysis catheter with no pneumothorax
after placement.

## 2013-01-19 IMAGING — CR DG CHEST 2V
2 series · 2 of 2 positions shown · non-contrast
Comparison: May 25, 2012

CLINICAL DATA: Hypertension; end-stage renal disease; preoperative
fistula formation

CHEST - 2 VIEW

[w chest pa]
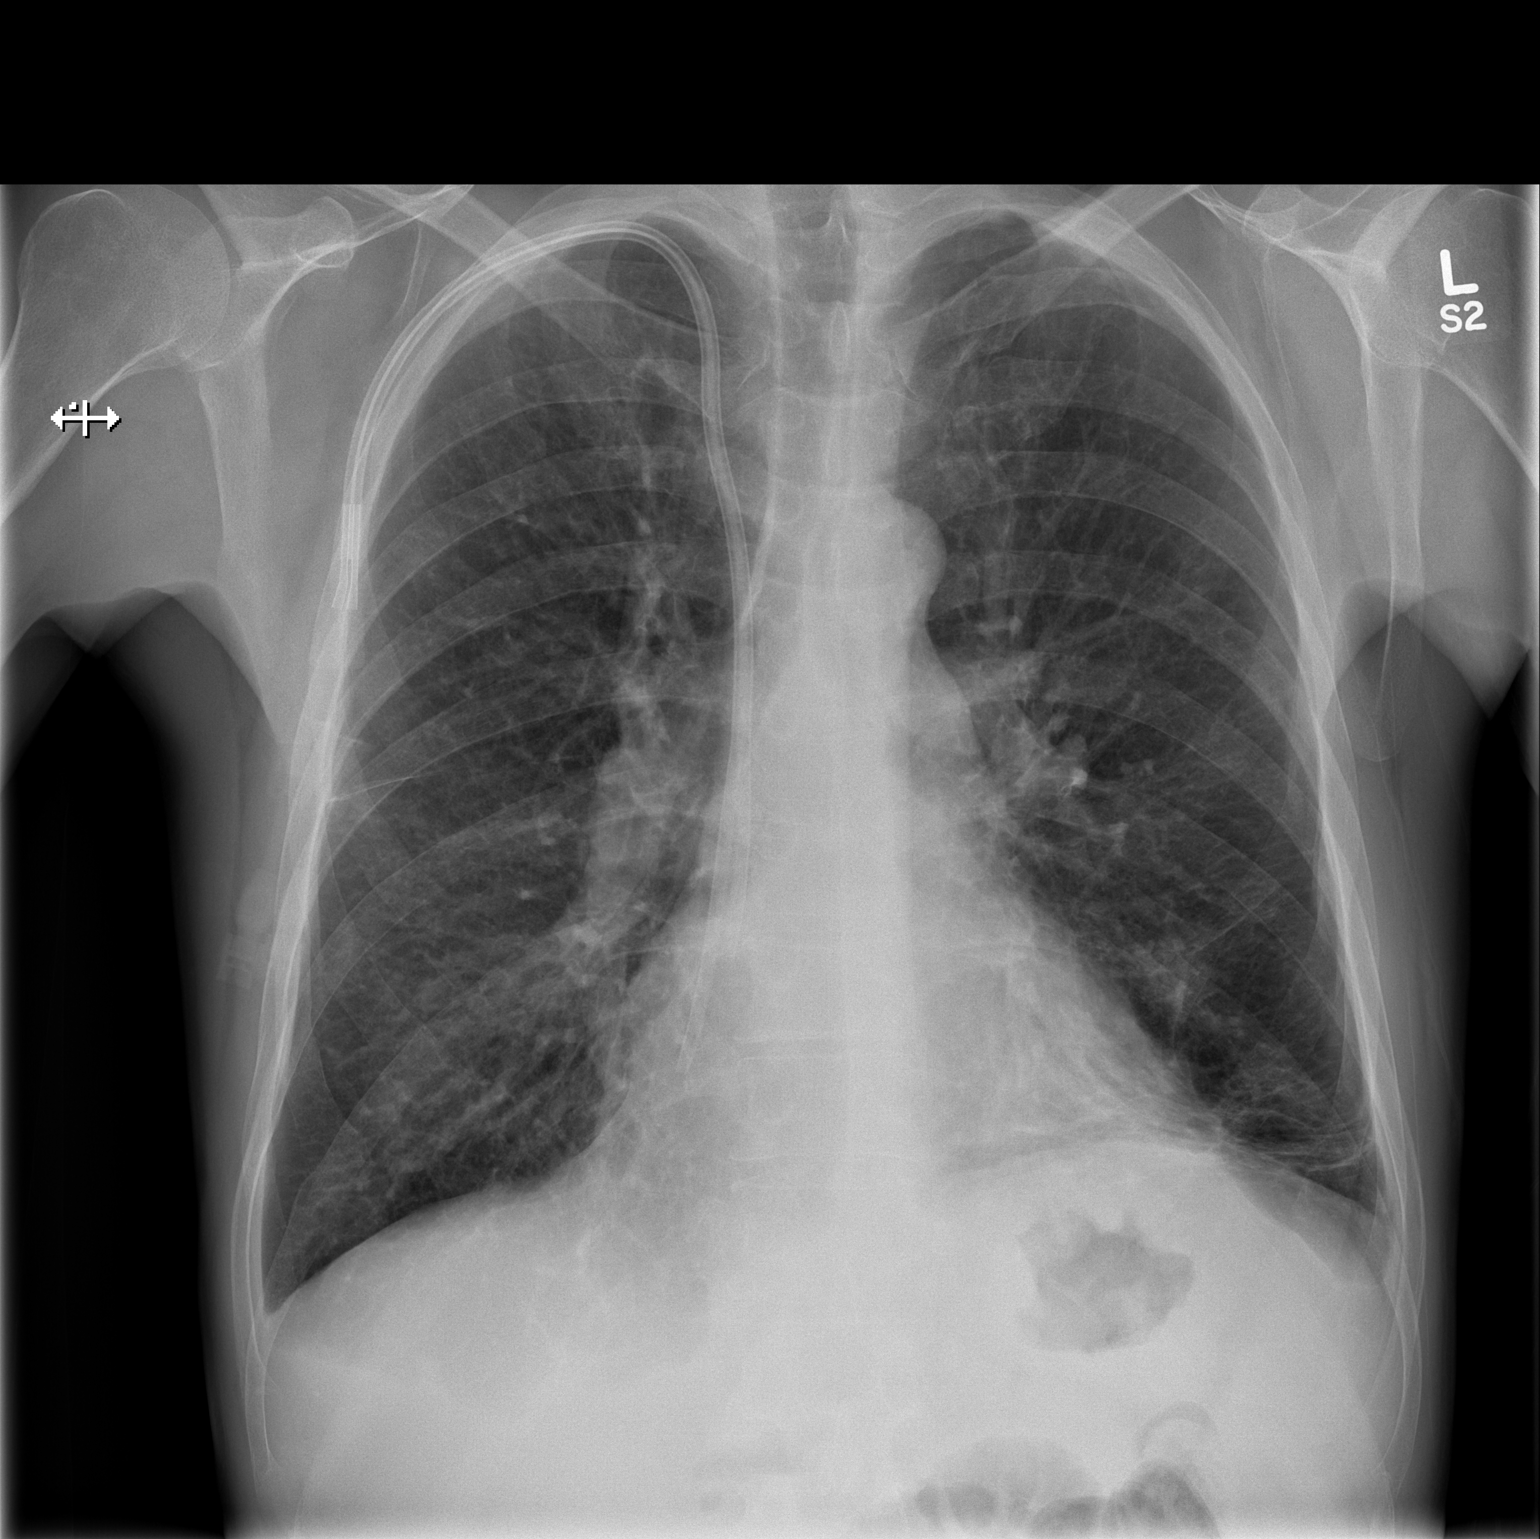

[w chest lat]
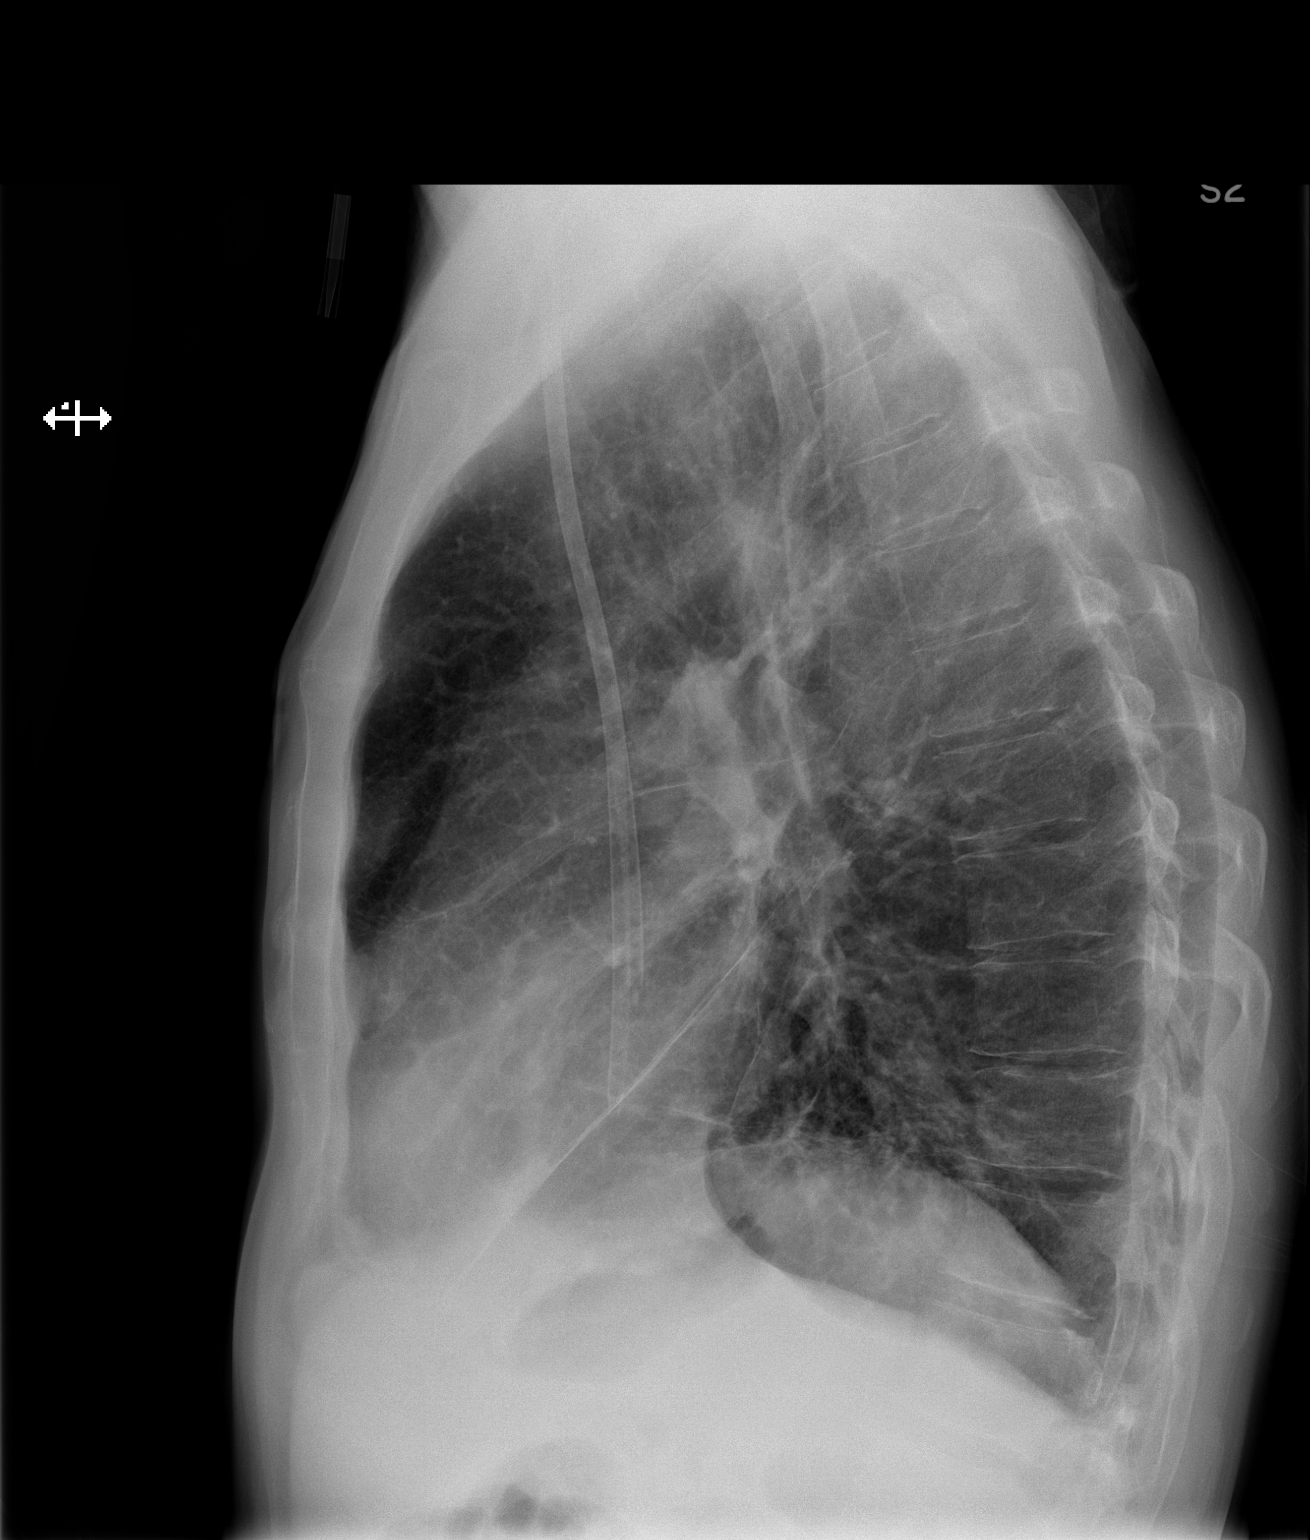

[2 of 2 positions shown; findings below may reference images not displayed]

FINDINGS: Dual lumen catheter tips are in the right atrium.  No
pneumothorax.

There is underlying emphysema.  There is bibasilar scarring, more
on the left than on the right.  There is no edema or consolidation.
The heart size and pulmonary vascularity are normal.  No
adenopathy.  No bone lesions.
IMPRESSION: Underlying emphysema with bibasilar scarring.  No edema or
consolidation.  Central catheter as described without pneumothorax.

## 2016-11-19 ENCOUNTER — Encounter: Payer: Self-pay | Admitting: Gastroenterology
